# Patient Record
Sex: Female | Born: 1990 | Race: White | Hispanic: No | Marital: Single | Smoking: Current every day smoker
Health system: Southern US, Community
[De-identification: ages and names within clinical notes are randomized; demographics above are authoritative.]

## PROBLEM LIST (undated history)

## (undated) ENCOUNTER — Emergency Department (HOSPITAL_COMMUNITY): Payer: No Typology Code available for payment source

## (undated) ENCOUNTER — Inpatient Hospital Stay (HOSPITAL_COMMUNITY): Payer: Self-pay

## (undated) DIAGNOSIS — Z87442 Personal history of urinary calculi: Secondary | ICD-10-CM

## (undated) DIAGNOSIS — Z22322 Carrier or suspected carrier of Methicillin resistant Staphylococcus aureus: Secondary | ICD-10-CM

## (undated) DIAGNOSIS — T8859XA Other complications of anesthesia, initial encounter: Secondary | ICD-10-CM

## (undated) DIAGNOSIS — F32A Depression, unspecified: Secondary | ICD-10-CM

## (undated) DIAGNOSIS — N200 Calculus of kidney: Secondary | ICD-10-CM

## (undated) DIAGNOSIS — F419 Anxiety disorder, unspecified: Secondary | ICD-10-CM

## (undated) DIAGNOSIS — F1110 Opioid abuse, uncomplicated: Secondary | ICD-10-CM

## (undated) DIAGNOSIS — I10 Essential (primary) hypertension: Secondary | ICD-10-CM

## (undated) DIAGNOSIS — N83209 Unspecified ovarian cyst, unspecified side: Secondary | ICD-10-CM

## (undated) DIAGNOSIS — N390 Urinary tract infection, site not specified: Secondary | ICD-10-CM

## (undated) DIAGNOSIS — O039 Complete or unspecified spontaneous abortion without complication: Secondary | ICD-10-CM

## (undated) DIAGNOSIS — K759 Inflammatory liver disease, unspecified: Secondary | ICD-10-CM

## (undated) DIAGNOSIS — I38 Endocarditis, valve unspecified: Secondary | ICD-10-CM

## (undated) HISTORY — DX: Complete or unspecified spontaneous abortion without complication: O03.9

## (undated) HISTORY — PX: CARDIAC CATHETERIZATION: SHX172

## (undated) HISTORY — PX: FINGER SURGERY: SHX640

## (undated) HISTORY — PX: OTHER SURGICAL HISTORY: SHX169

---
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
## 2010-04-05 ENCOUNTER — Emergency Department: Payer: Self-pay | Admitting: Unknown Physician Specialty

## 2010-06-09 IMAGING — CR DG FEMUR 2V*R*
1 series · 5 of 5 positions shown · non-contrast
Comparison: none

REASON FOR EXAM: right leg pain
COMMENTS:

PROCEDURE:     DXR - DXR FEMUR RIGHT  - [DATE] [DATE]
RESULT:     No fracture, dislocation or other acute bony abnormality is
identified.

[Series 1: t femur proximal ap right · 0.14mm/px · 5 of 5 slices shown]
[im 1/5]
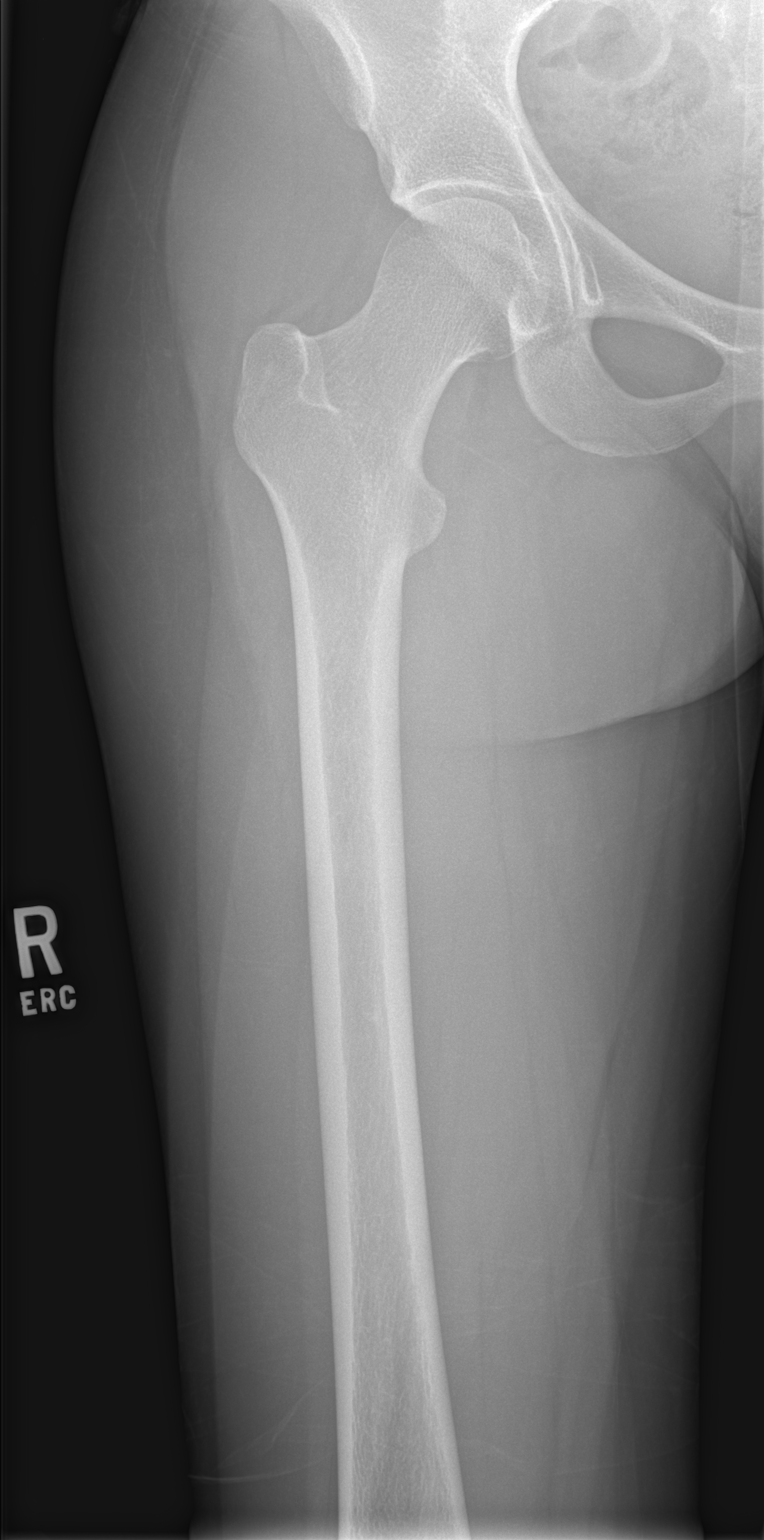
[im 2/5]
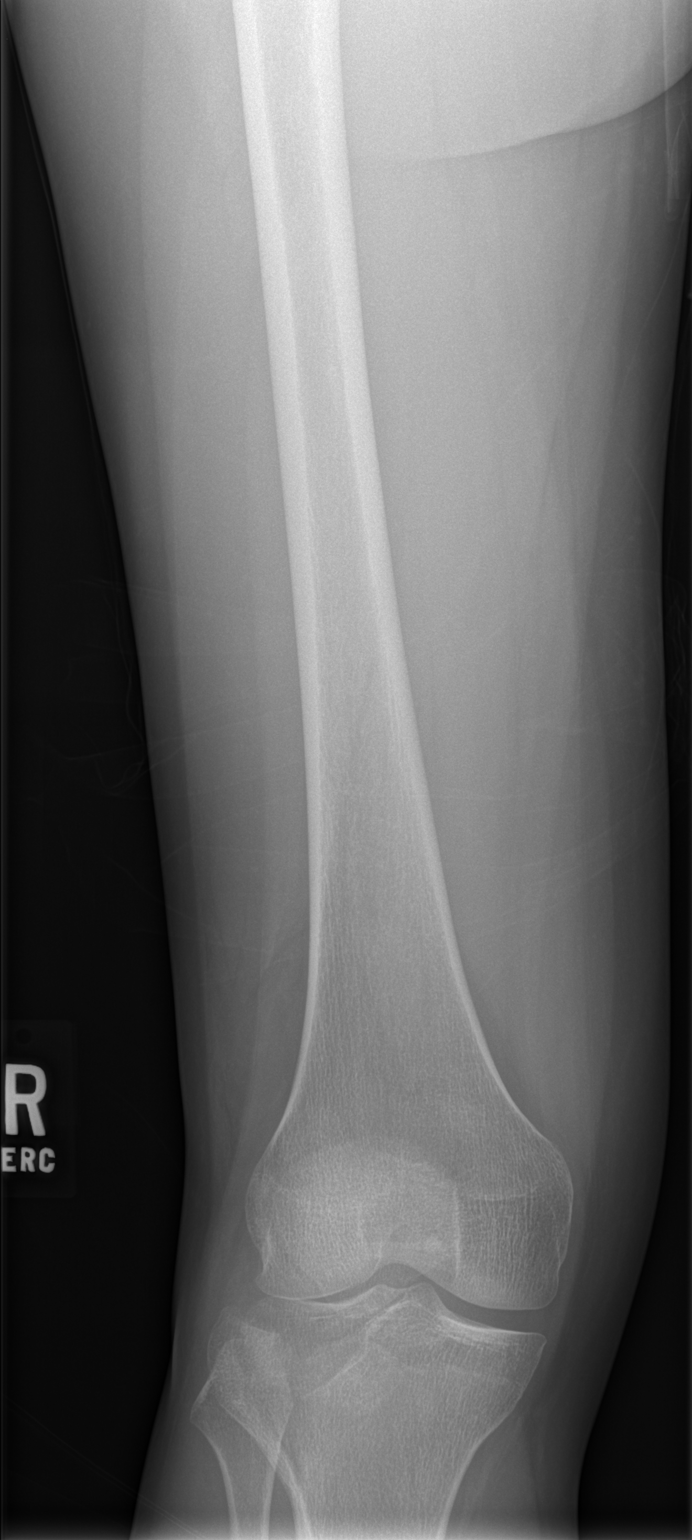
[im 3/5]
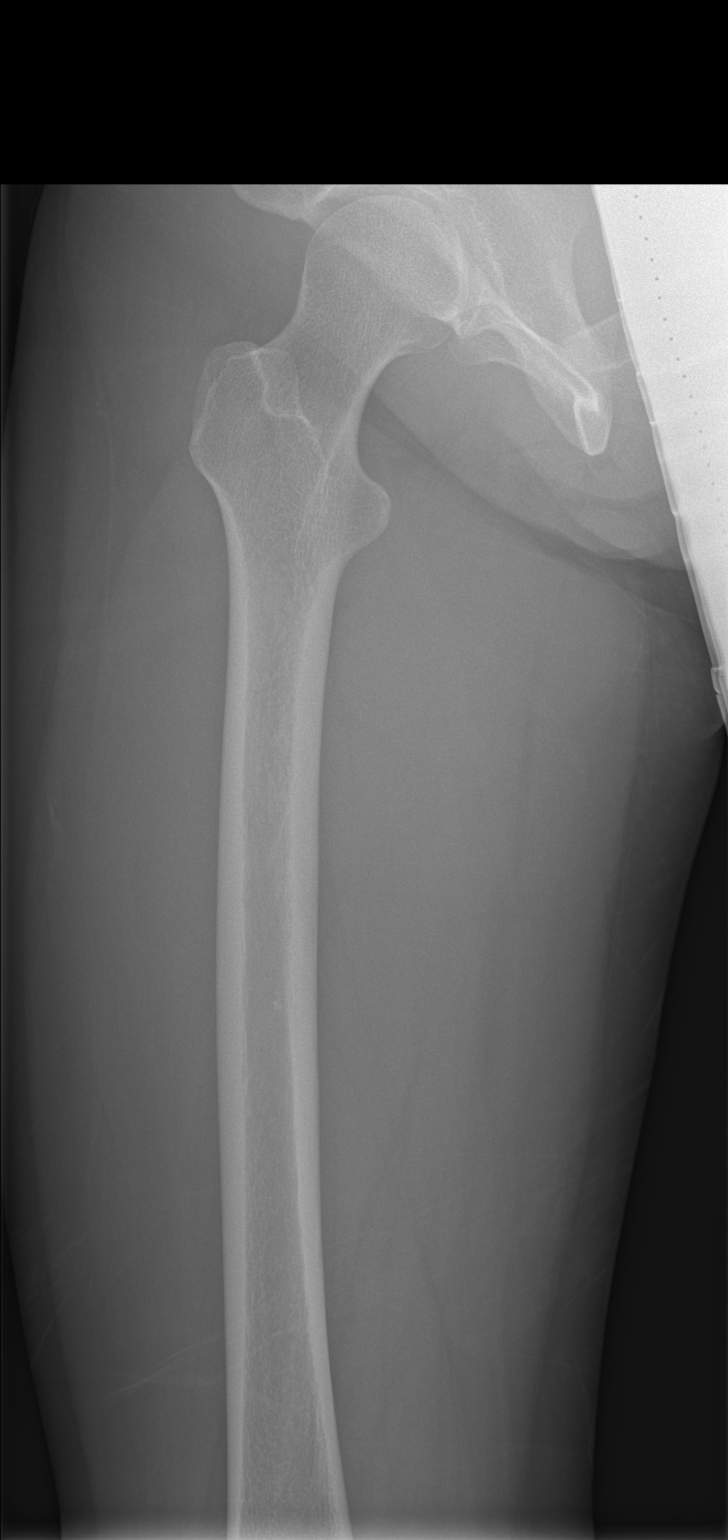
[im 4/5]
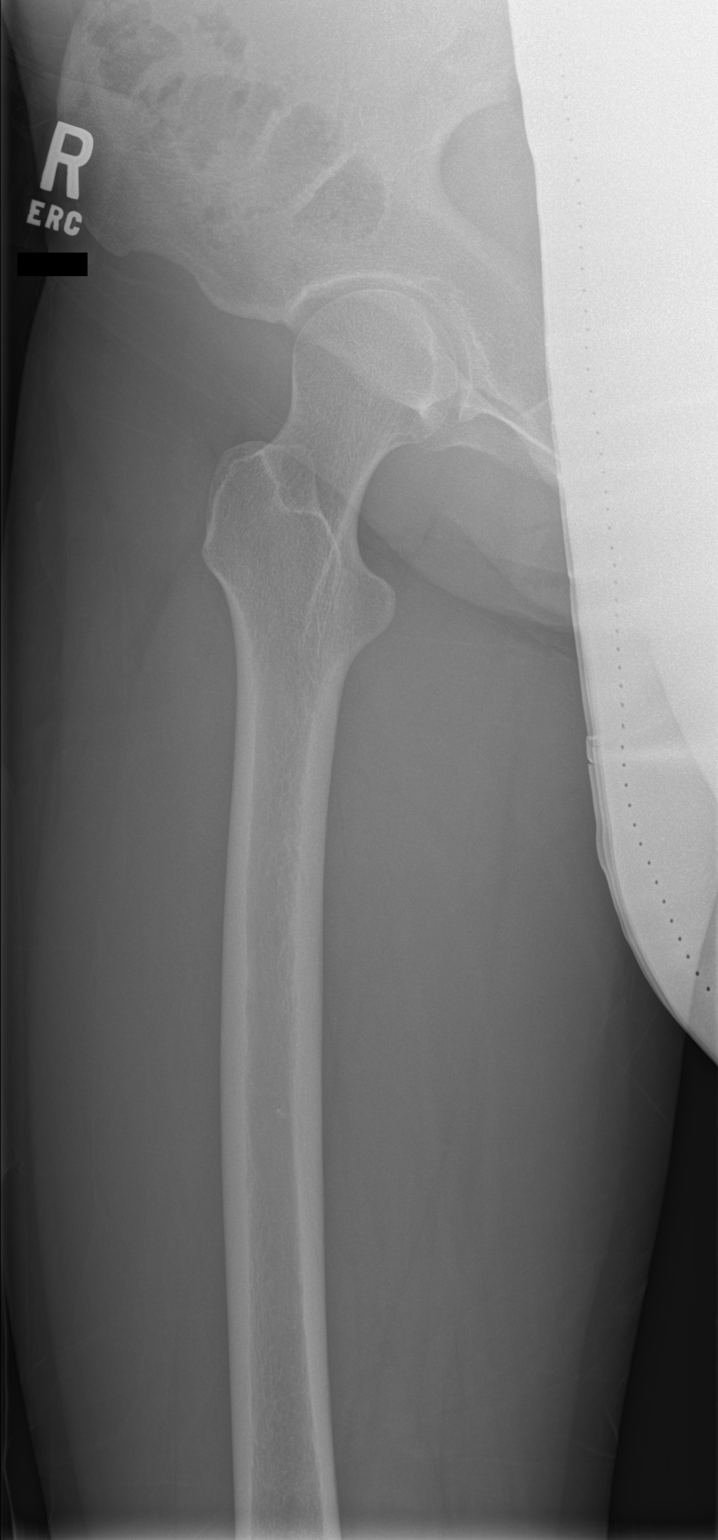
[im 5/5]
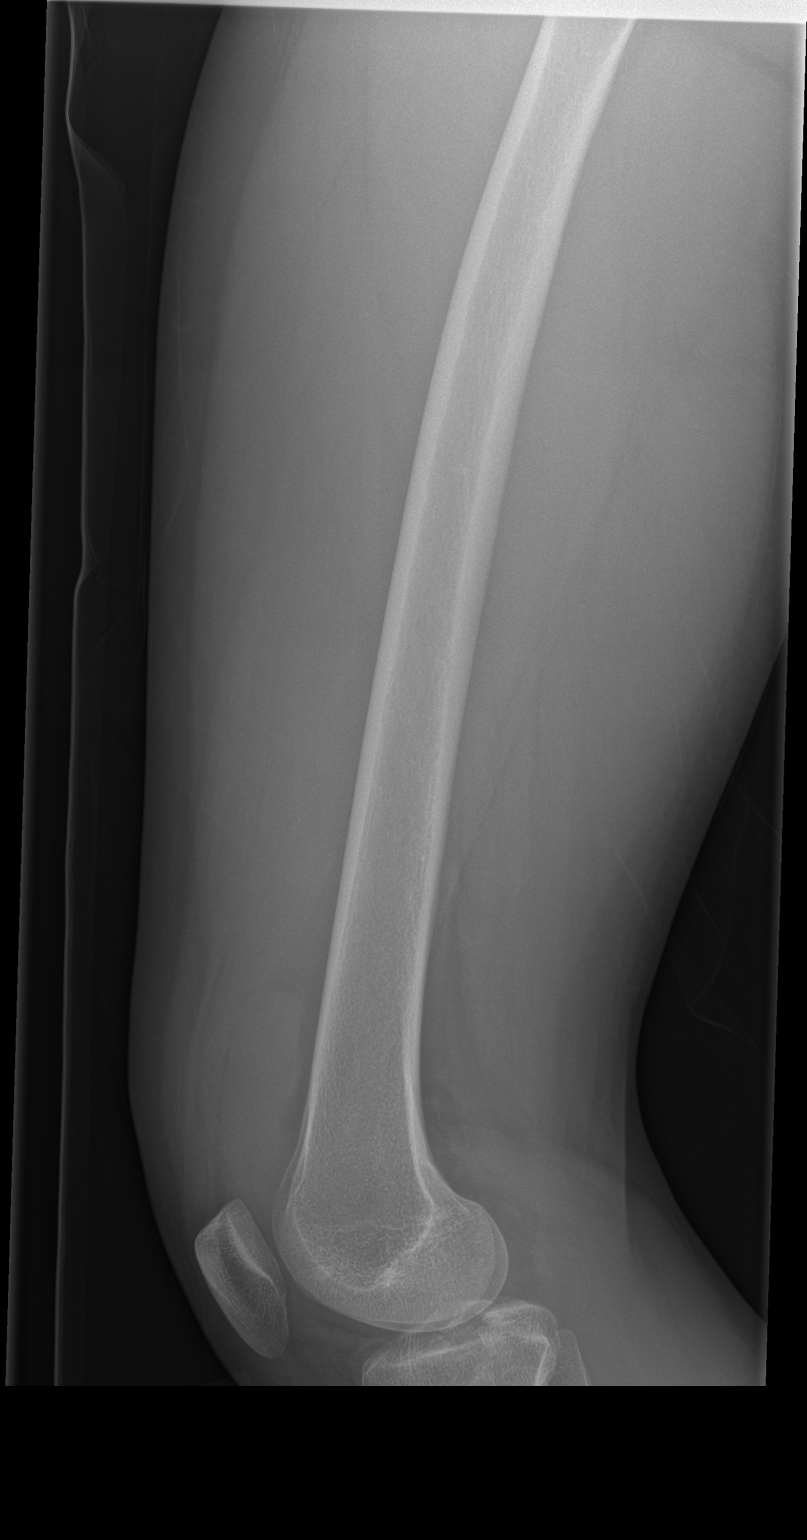

[5 of 5 positions shown; findings below may reference images not displayed]

IMPRESSION: No significant osseous abnormalities are noted.

## 2010-09-05 ENCOUNTER — Emergency Department: Payer: Self-pay | Admitting: Emergency Medicine

## 2010-09-05 IMAGING — CT CT STONE STUDY
1 of 2 series · 15 of 32 positions shown, 19 images · non-contrast
Comparison: none

REASON FOR EXAM: pelvic pain with hematuria and elevate WBC
COMMENTS:   LMP: One week ago

[Series 2: stone · axial · 0.55mm/px · z∈[-500,-156]mm · 15 of 130 slices shown, 19 images]
[im 10/130  soft-tissue]
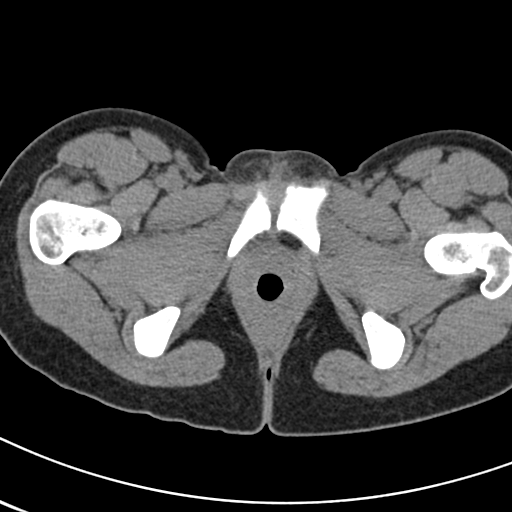
[im 10/130  bone]
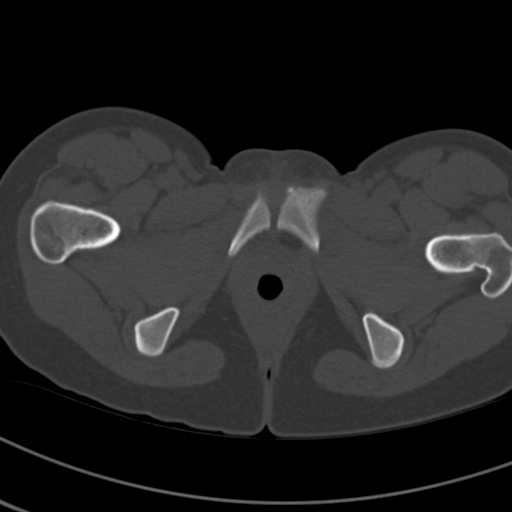
[im 20/130  soft-tissue]
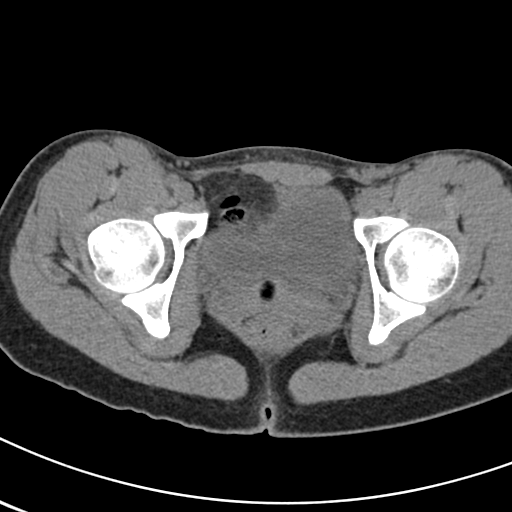
[im 29/130  soft-tissue]
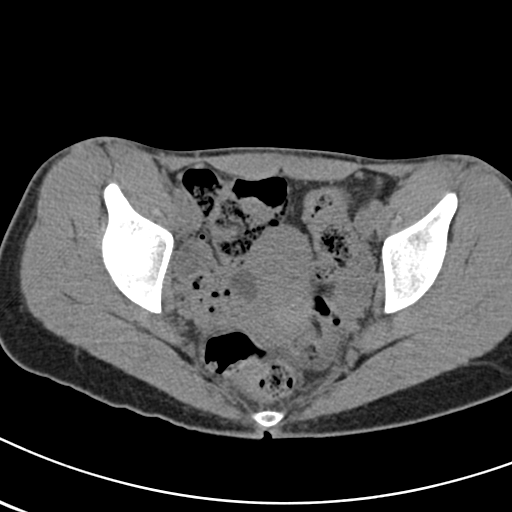
[im 39/130  soft-tissue]
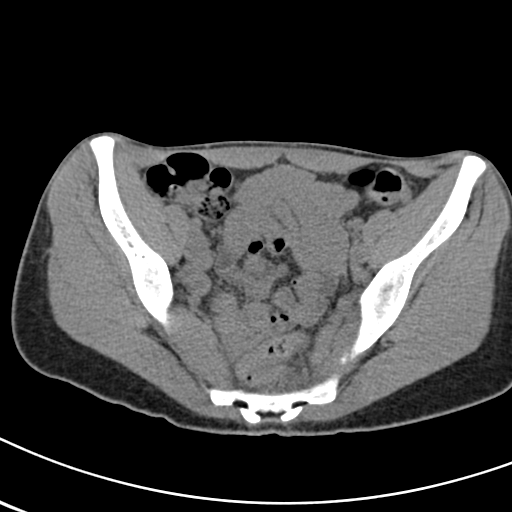
[im 48/130  soft-tissue]
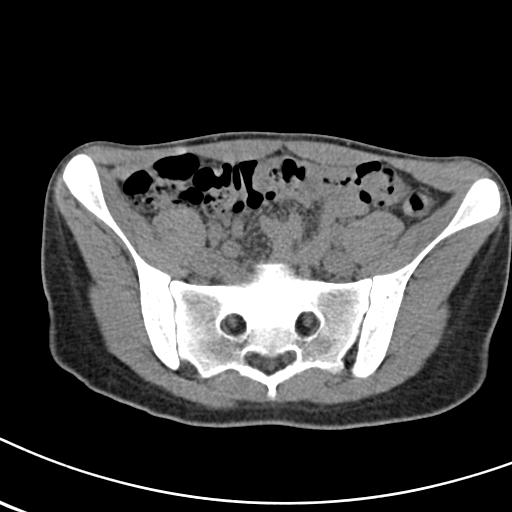
[im 58/130  soft-tissue]
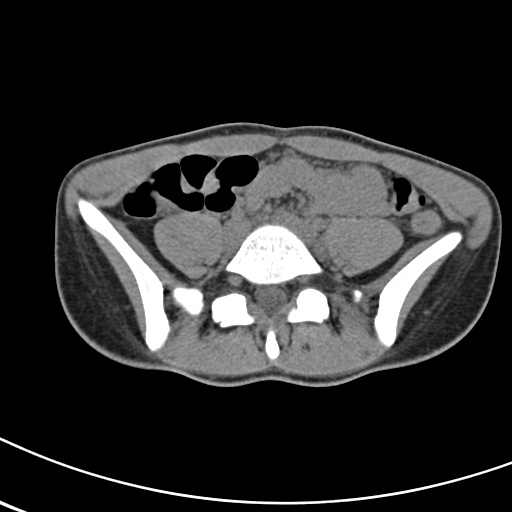
[im 67/130  soft-tissue]
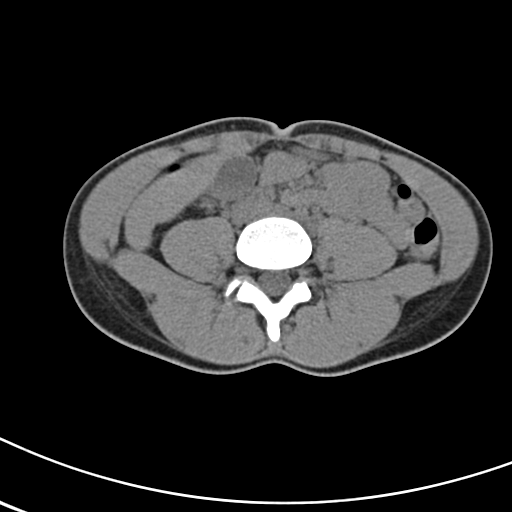
[im 77/130  soft-tissue]
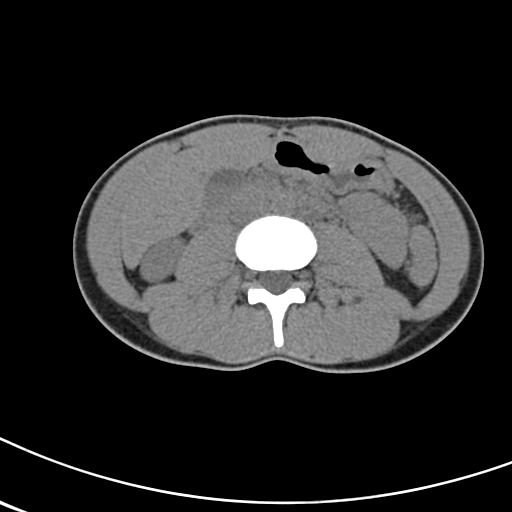
[im 87/130  soft-tissue]
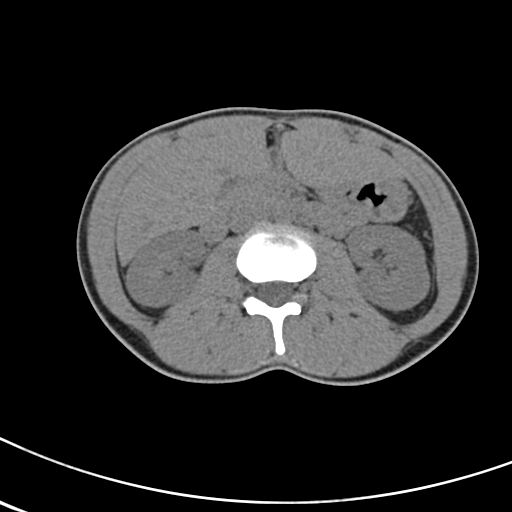
[im 87/130  bone]
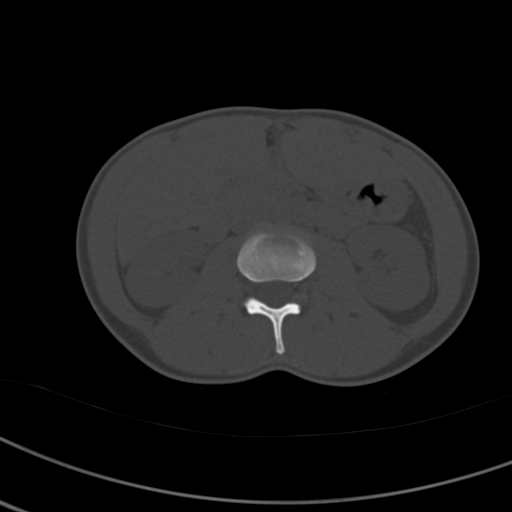
[im 96/130  soft-tissue]
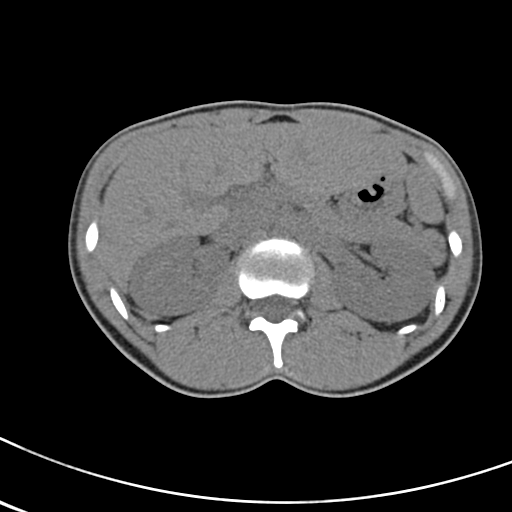
[im 106/130  soft-tissue]
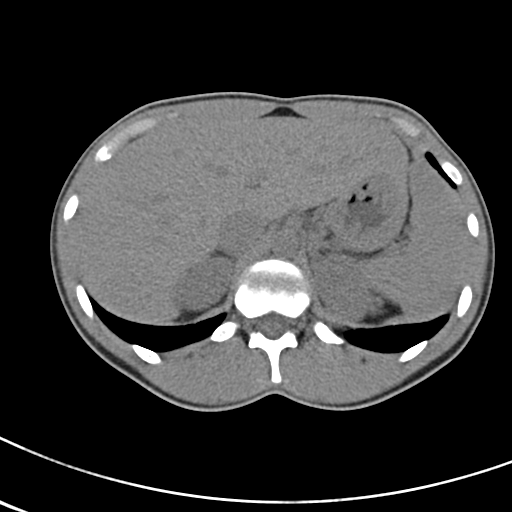
[im 110/130  lung]
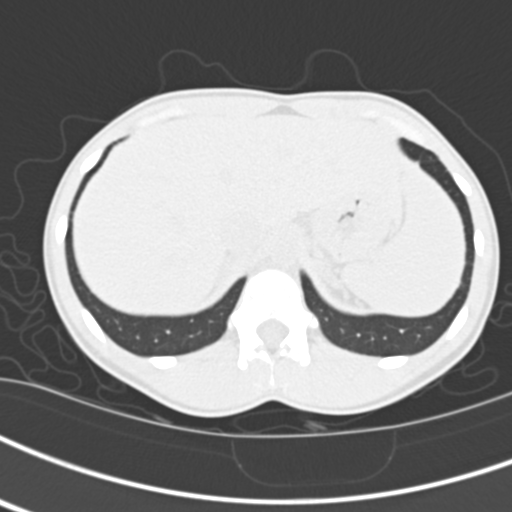
[im 115/130  soft-tissue]
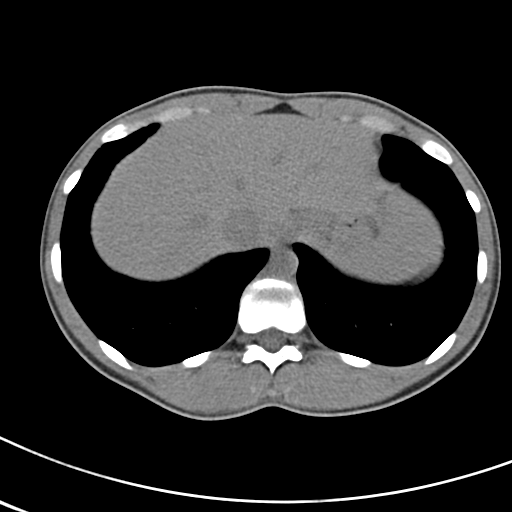
[im 115/130  lung]
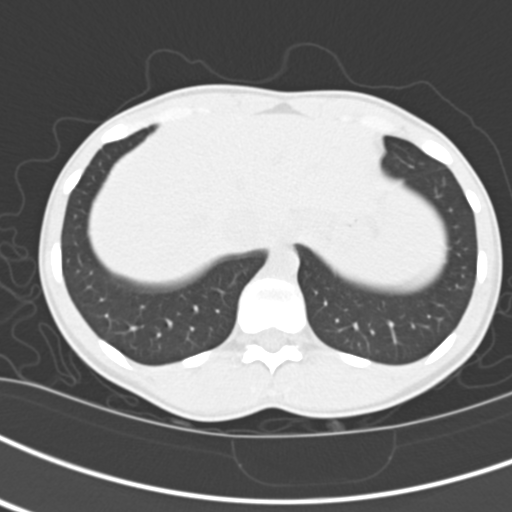
[im 120/130  lung]
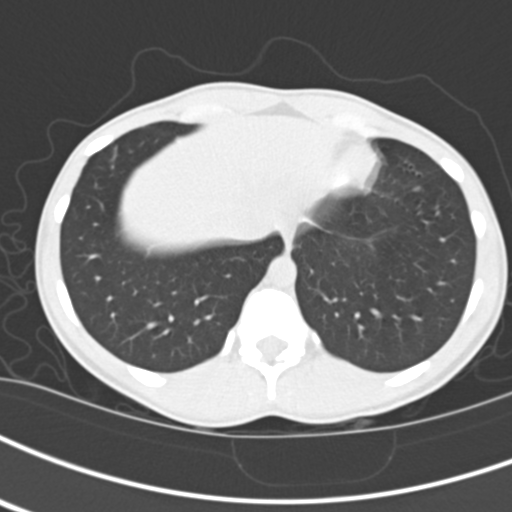
[im 125/130  soft-tissue]
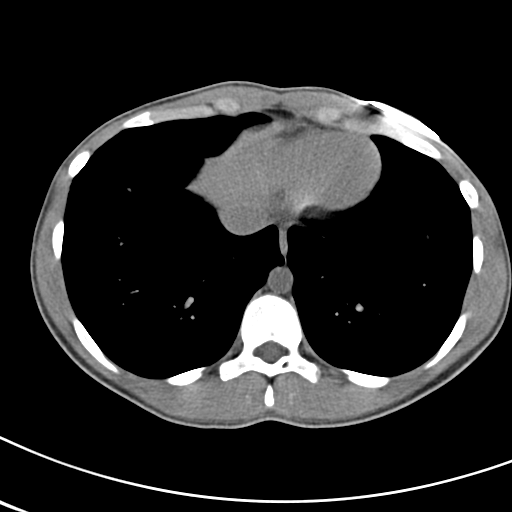
[im 125/130  lung]
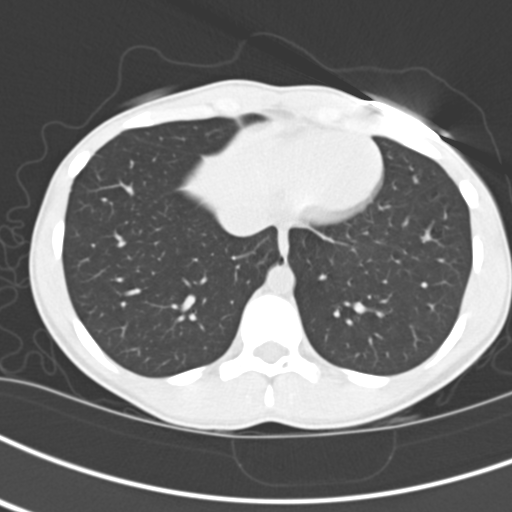

[15 of 32 positions shown; findings below may reference images not displayed]

PROCEDURE:     CT  - CT ABDOMEN /PELVIS WO (STONE)  - [DATE]  [DATE]

RESULT:     Emergent noncontrast CT of the abdomen and pelvis is compared to
the previous examination of [DATE] and to an exam dated [DATE].
Images are reconstructed in the axial plane at 3.0 mm slice thickness.

Images through the base of the lungs demonstrate normal aeration. There is
evidence of a right adnexal cyst measuring 1.6 cm diameter on image 103. The
ureters are poorly demonstrated given the paucity of retroperitoneal fat. No
definite nephrolithiasis or hydronephrosis is evident. The other abdominal
viscera appear grossly normal for a noncontrast exam area no cholelithiasis
is evident. There is no evidence of acute inflammatory process but again
there is limited visualization. No bladder calculi are evident. The uterus
is unremarkable. The appendix is not identified with certainty. Portions of
the appendix appear to be adjacent to the right pelvic sidewall between
images 95 and 106 and appear to be unremarkable.
IMPRESSION: 1. Limited exam because of the paucity of mesenteric and retroperitoneal
fat. No definite urinary obstruction or bowel obstruction. No definite acute
inflammation. Probable right ovarian cyst present. No significant free fluid
or abnormal fluid collection evident. A vaginal tampon is noted incidentally.

## 2010-12-26 ENCOUNTER — Emergency Department (HOSPITAL_COMMUNITY)
Admission: EM | Admit: 2010-12-26 | Discharge: 2010-12-26 | Disposition: A | Discharge status: Home / Self Care | Payer: Self-pay | Attending: Emergency Medicine | Admitting: Emergency Medicine

## 2010-12-26 DIAGNOSIS — R300 Dysuria: Secondary | ICD-10-CM | POA: Insufficient documentation

## 2010-12-26 DIAGNOSIS — N898 Other specified noninflammatory disorders of vagina: Secondary | ICD-10-CM | POA: Insufficient documentation

## 2010-12-26 DIAGNOSIS — R109 Unspecified abdominal pain: Secondary | ICD-10-CM | POA: Insufficient documentation

## 2010-12-26 DIAGNOSIS — N390 Urinary tract infection, site not specified: Secondary | ICD-10-CM | POA: Insufficient documentation

## 2010-12-26 DIAGNOSIS — N949 Unspecified condition associated with female genital organs and menstrual cycle: Secondary | ICD-10-CM | POA: Insufficient documentation

## 2010-12-26 HISTORY — DX: Unspecified ovarian cyst, unspecified side: N83.209

## 2010-12-26 HISTORY — DX: Carrier or suspected carrier of methicillin resistant Staphylococcus aureus: Z22.322

## 2010-12-26 HISTORY — DX: Urinary tract infection, site not specified: N39.0

## 2010-12-26 HISTORY — DX: Calculus of kidney: N20.0

## 2010-12-26 LAB — URINALYSIS, ROUTINE W REFLEX MICROSCOPIC
Glucose, UA: NEGATIVE mg/dL
Nitrite: NEGATIVE
Specific Gravity, Urine: 1.016 (ref 1.005–1.030)
pH: 7 (ref 5.0–8.0)

## 2010-12-26 LAB — URINE MICROSCOPIC-ADD ON

## 2010-12-26 MED ORDER — PHENAZOPYRIDINE HCL 200 MG PO TABS: 200.0000 mg | ORAL_TABLET | Freq: Three times a day (TID) | ORAL | Status: AC

## 2010-12-26 MED ORDER — SULFAMETHOXAZOLE-TRIMETHOPRIM 800-160 MG PO TABS: 1.0000 | ORAL_TABLET | Freq: Two times a day (BID) | ORAL | Status: AC

## 2010-12-26 NOTE — ED Notes (Signed)
Symptoms began around 0300 am , having vaginal pressure , she is on her period but she is also having discharge. She is having dsyruia and hematuria, hx of kidney stones
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
## 2011-06-25 ENCOUNTER — Emergency Department: Payer: Self-pay | Admitting: Unknown Physician Specialty

## 2011-06-25 LAB — COMPREHENSIVE METABOLIC PANEL
Calcium, Total: 9 mg/dL (ref 8.5–10.1)
Chloride: 106 mmol/L (ref 98–107)
Co2: 28 mmol/L (ref 21–32)
Creatinine: 0.73 mg/dL (ref 0.60–1.30)
EGFR (African American): >60
EGFR (Non-African Amer.): >60
Potassium: 4 mmol/L (ref 3.5–5.1)
SGOT(AST): 24 U/L (ref 15–37)
Total Protein: 7.5 g/dL (ref 6.4–8.2)

## 2011-06-25 LAB — CBC
HGB: 13.2 g/dL (ref 12.0–16.0)
MCV: 86 fL (ref 80–100)

## 2011-06-25 LAB — DRUG SCREEN, URINE
Barbiturates, Ur Screen: NEGATIVE (ref ?–200)
MDMA (Ecstasy)Ur Screen: NEGATIVE (ref ?–500)
Methadone, Ur Screen: NEGATIVE (ref ?–300)
Phencyclidine (PCP) Ur S: NEGATIVE (ref ?–25)
Tricyclic, Ur Screen: NEGATIVE (ref ?–1000)

## 2011-06-25 LAB — URINALYSIS, COMPLETE
Ph: 6 (ref 4.5–8.0)
Protein: NEGATIVE
Specific Gravity: 1.011 (ref 1.003–1.030)
WBC UR: 14 /HPF (ref 0–5)

## 2011-06-29 ENCOUNTER — Emergency Department: Payer: Self-pay | Admitting: *Deleted

## 2011-06-29 LAB — ETHANOL
Ethanol %: <0.003 % (ref 0.000–0.080)
Ethanol: <3 mg/dL
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
## 2011-07-02 ENCOUNTER — Ambulatory Visit: Payer: Self-pay | Admitting: Orthopedic Surgery

## 2011-07-02 ENCOUNTER — Emergency Department: Payer: Self-pay | Admitting: Emergency Medicine
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
## 2012-10-14 ENCOUNTER — Emergency Department (HOSPITAL_COMMUNITY): Payer: Self-pay

## 2012-10-14 ENCOUNTER — Encounter (HOSPITAL_COMMUNITY): Payer: Self-pay | Admitting: Radiology

## 2012-10-14 ENCOUNTER — Emergency Department (HOSPITAL_COMMUNITY)
Admission: EM | Admit: 2012-10-14 | Discharge: 2012-10-15 | Disposition: A | Discharge status: Home / Self Care | Payer: Self-pay | Attending: Emergency Medicine | Admitting: Emergency Medicine

## 2012-10-14 DIAGNOSIS — Z87442 Personal history of urinary calculi: Secondary | ICD-10-CM | POA: Insufficient documentation

## 2012-10-14 DIAGNOSIS — Z8614 Personal history of Methicillin resistant Staphylococcus aureus infection: Secondary | ICD-10-CM | POA: Insufficient documentation

## 2012-10-14 DIAGNOSIS — Z87440 Personal history of urinary (tract) infections: Secondary | ICD-10-CM | POA: Insufficient documentation

## 2012-10-14 DIAGNOSIS — R109 Unspecified abdominal pain: Secondary | ICD-10-CM | POA: Insufficient documentation

## 2012-10-14 DIAGNOSIS — Z8742 Personal history of other diseases of the female genital tract: Secondary | ICD-10-CM | POA: Insufficient documentation

## 2012-10-14 DIAGNOSIS — F17200 Nicotine dependence, unspecified, uncomplicated: Secondary | ICD-10-CM | POA: Insufficient documentation

## 2012-10-14 LAB — COMPREHENSIVE METABOLIC PANEL
ALT: 9 U/L (ref 0–35)
AST: 13 U/L (ref 0–37)
Albumin: 3.4 g/dL — ABNORMAL LOW (ref 3.5–5.2)
Alkaline Phosphatase: 79 U/L (ref 39–117)
BUN: 9 mg/dL (ref 6–23)
CO2: 24 mEq/L (ref 19–32)
Calcium: 9.3 mg/dL (ref 8.4–10.5)
Chloride: 105 mEq/L (ref 96–112)
Creatinine, Ser: 0.69 mg/dL (ref 0.50–1.10)
GFR calc Af Amer: >90 mL/min (ref >90)
GFR calc non Af Amer: >90 mL/min (ref >90)
Glucose, Bld: 87 mg/dL (ref 70–99)
Potassium: 3.7 mEq/L (ref 3.5–5.1)
Sodium: 138 mEq/L (ref 135–145)
Total Bilirubin: 0.3 mg/dL (ref 0.3–1.2)
Total Protein: 7.1 g/dL (ref 6.0–8.3)

## 2012-10-14 LAB — CBC WITH DIFFERENTIAL/PLATELET
Basophils Absolute: 0 10*3/uL (ref 0.0–0.1)
Basophils Relative: 0 % (ref 0–1)
Eosinophils Absolute: 0.3 10*3/uL (ref 0.0–0.7)
Eosinophils Relative: 3 % (ref 0–5)
HCT: 37.6 % (ref 36.0–46.0)
Hemoglobin: 12.6 g/dL (ref 12.0–15.0)
Lymphocytes Relative: 18 % (ref 12–46)
Lymphs Abs: 1.8 10*3/uL (ref 0.7–4.0)
MCH: 28.5 pg (ref 26.0–34.0)
MCHC: 33.5 g/dL (ref 30.0–36.0)
MCV: 85.1 fL (ref 78.0–100.0)
Monocytes Absolute: 1 10*3/uL (ref 0.1–1.0)
Monocytes Relative: 10 % (ref 3–12)
Neutro Abs: 6.7 10*3/uL (ref 1.7–7.7)
Neutrophils Relative %: 69 % (ref 43–77)
Platelets: 319 10*3/uL (ref 150–400)
RBC: 4.42 MIL/uL (ref 3.87–5.11)
RDW: 14.1 % (ref 11.5–15.5)
WBC: 9.7 10*3/uL (ref 4.0–10.5)

## 2012-10-14 LAB — URINALYSIS, ROUTINE W REFLEX MICROSCOPIC
Bilirubin Urine: NEGATIVE
Glucose, UA: NEGATIVE mg/dL
Hgb urine dipstick: NEGATIVE
Ketones, ur: NEGATIVE mg/dL
Nitrite: NEGATIVE
Protein, ur: NEGATIVE mg/dL
Specific Gravity, Urine: 1.018 (ref 1.005–1.030)
Urobilinogen, UA: 0.2 mg/dL (ref 0.0–1.0)
pH: 6.5 (ref 5.0–8.0)

## 2012-10-14 LAB — WET PREP, GENITAL
Trich, Wet Prep: NONE SEEN
WBC, Wet Prep HPF POC: NONE SEEN
Yeast Wet Prep HPF POC: NONE SEEN

## 2012-10-14 LAB — URINE MICROSCOPIC-ADD ON

## 2012-10-14 IMAGING — CT CT ABD-PELV W/ CM
1 of 2 series · 16 of 32 positions shown, 20 images · IV contrast (OMNIPAQUE 300)
Comparison: Ultrasound [DATE]

CLINICAL DATA: Abdominal pain.

EXAM:
CT ABDOMEN AND PELVIS WITH CONTRAST
TECHNIQUE: Multidetector CT imaging of the abdomen and pelvis was performed
using the standard protocol following bolus administration of
intravenous contrast.
CONTRAST:  80mL OMNIPAQUE IOHEXOL 300 MG/ML  SOLN

[Series 2: abd/pel with · axial · 0.74mm/px · z∈[+1045,+1420]mm · 16 of 83 slices shown, 20 images]
[im 4/83  soft-tissue]
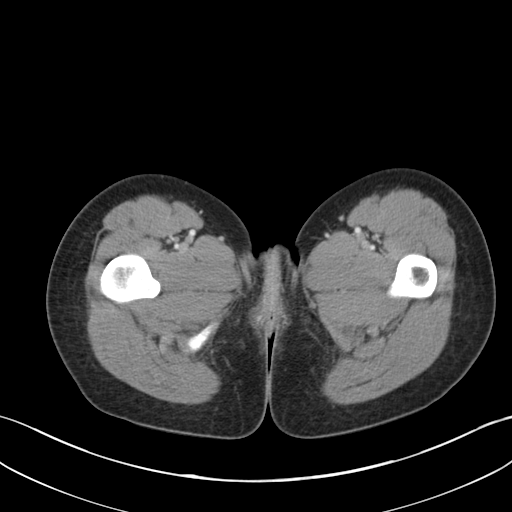
[im 4/83  bone]
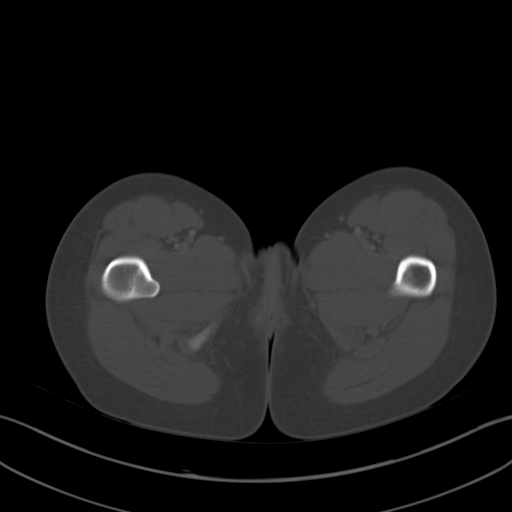
[im 11/83  soft-tissue]
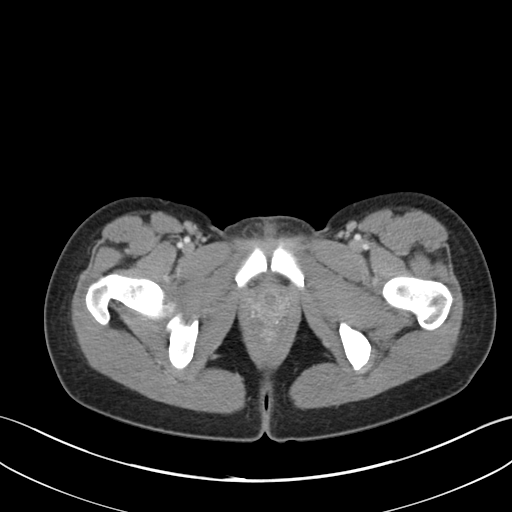
[im 18/83  soft-tissue]
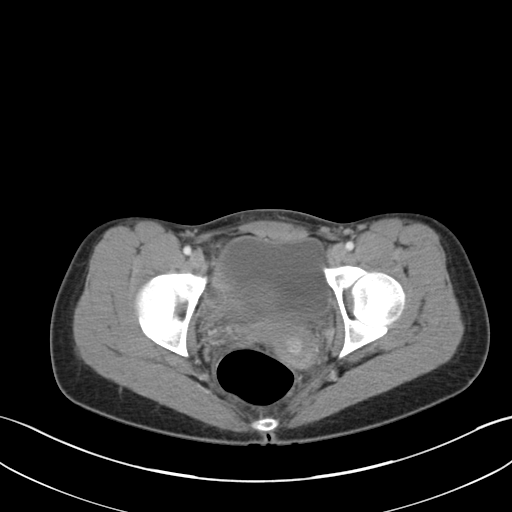
[im 21/83  soft-tissue]
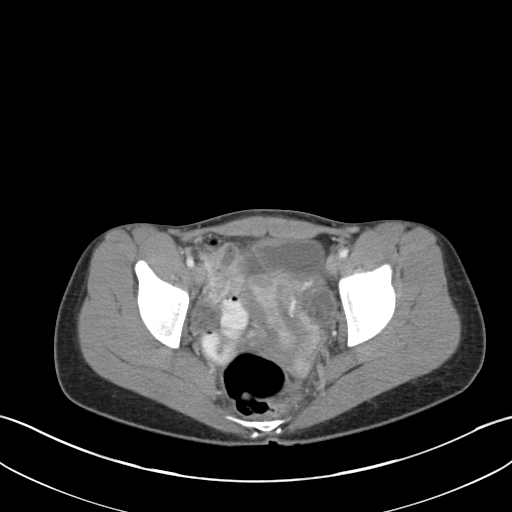
[im 28/83  soft-tissue]
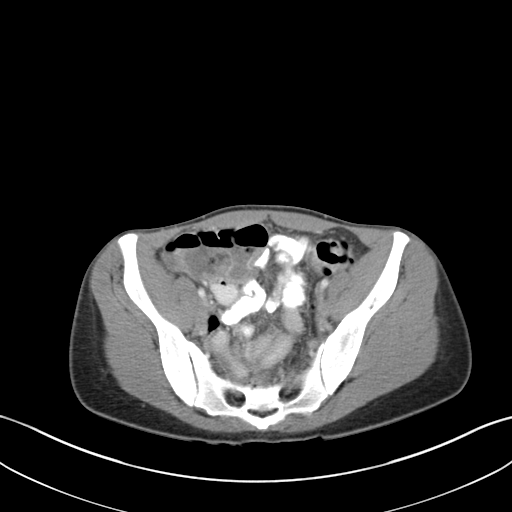
[im 35/83  soft-tissue]
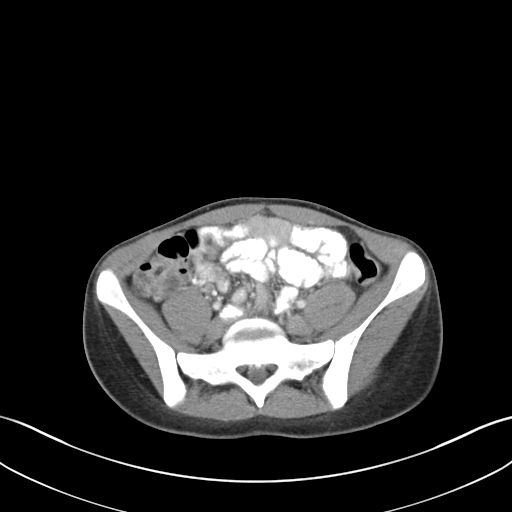
[im 38/83  soft-tissue]
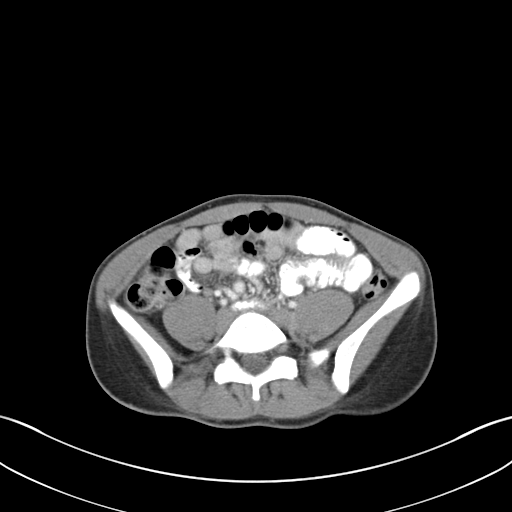
[im 45/83  soft-tissue]
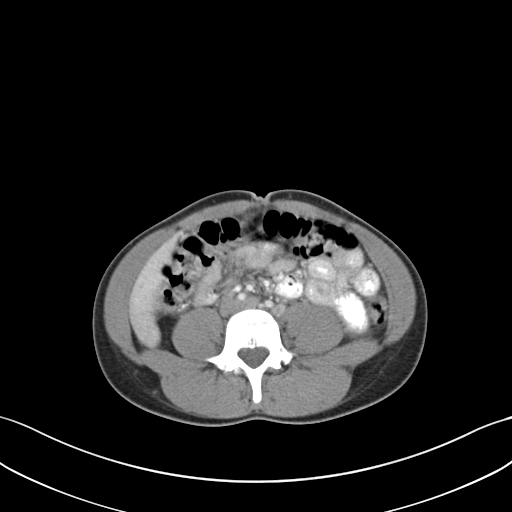
[im 48/83  soft-tissue]
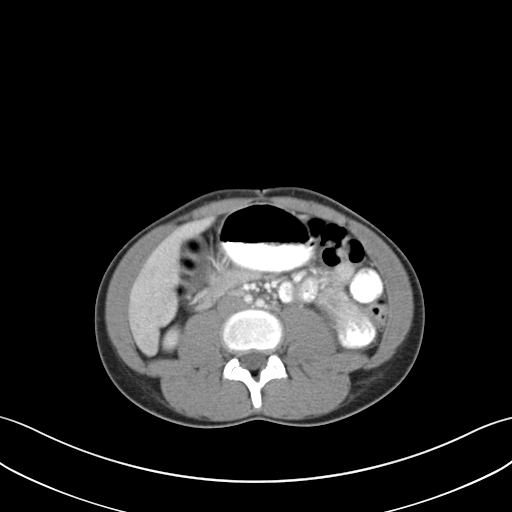
[im 48/83  bone]
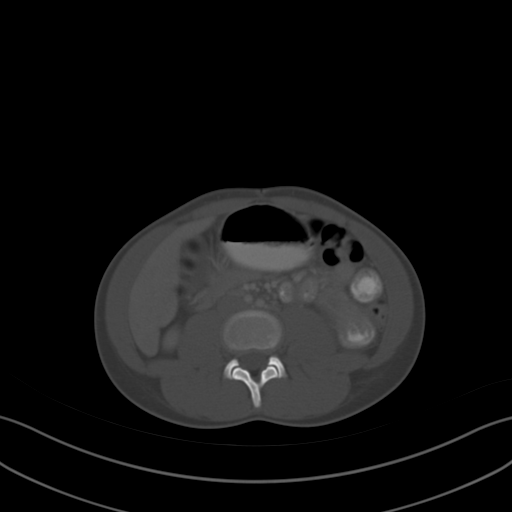
[im 55/83  soft-tissue]
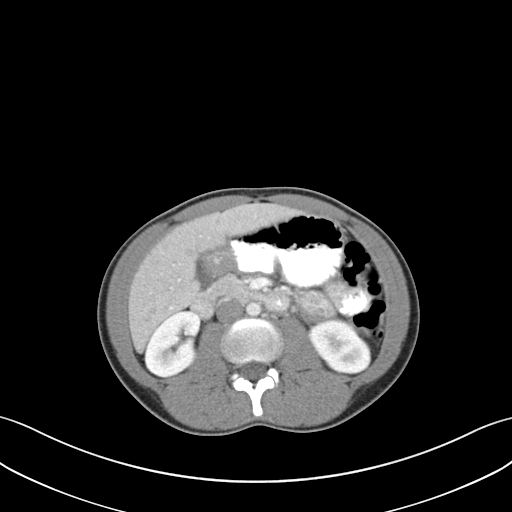
[im 62/83  soft-tissue]
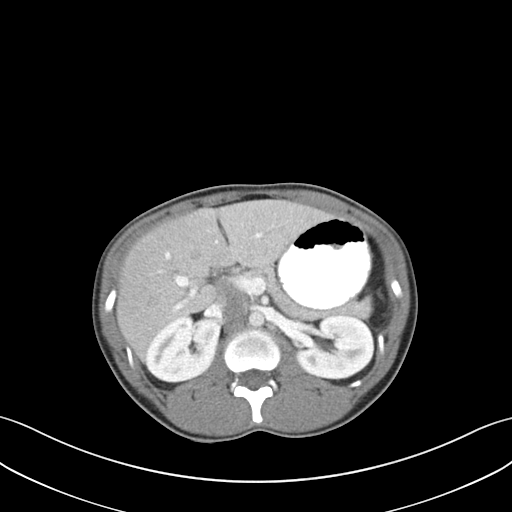
[im 65/83  soft-tissue]
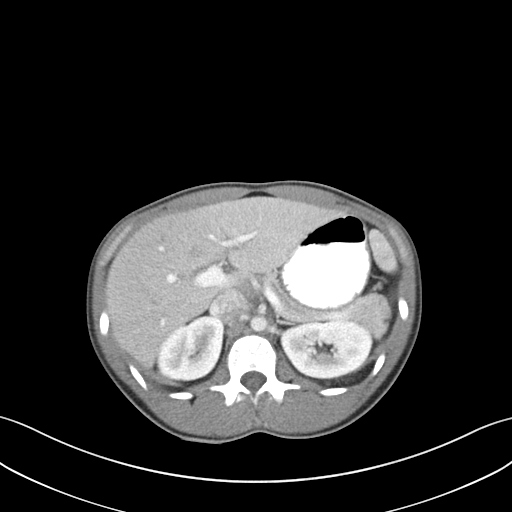
[im 69/83  lung]
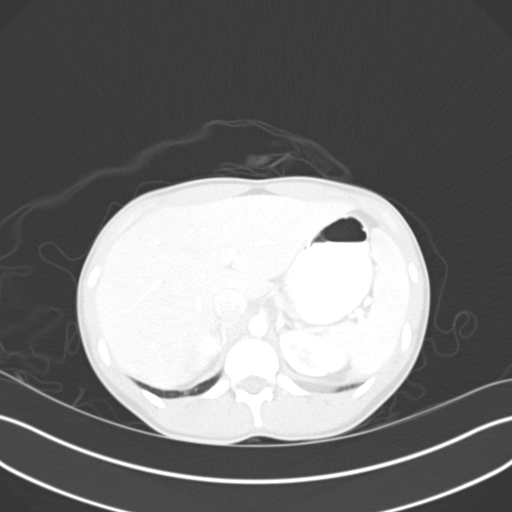
[im 72/83  soft-tissue]
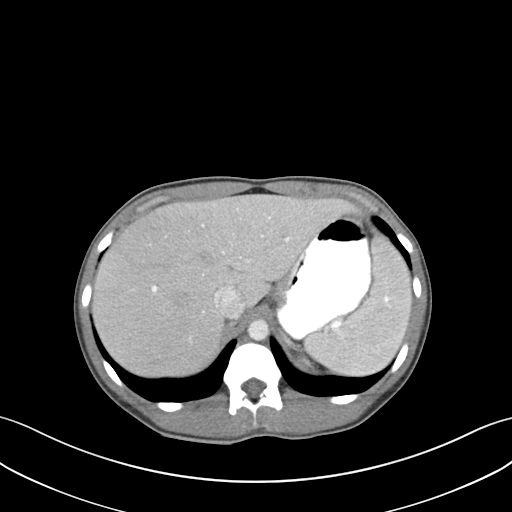
[im 72/83  lung]
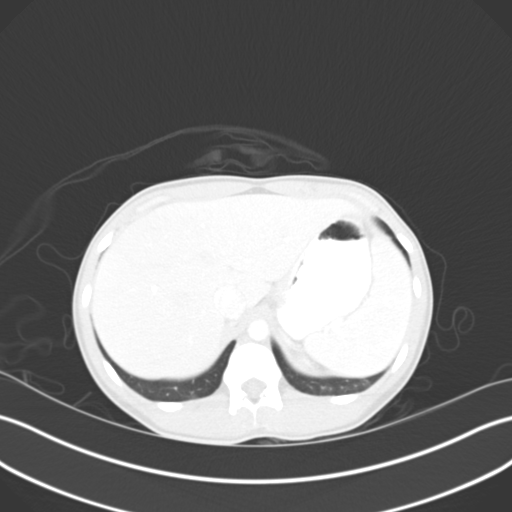
[im 76/83  lung]
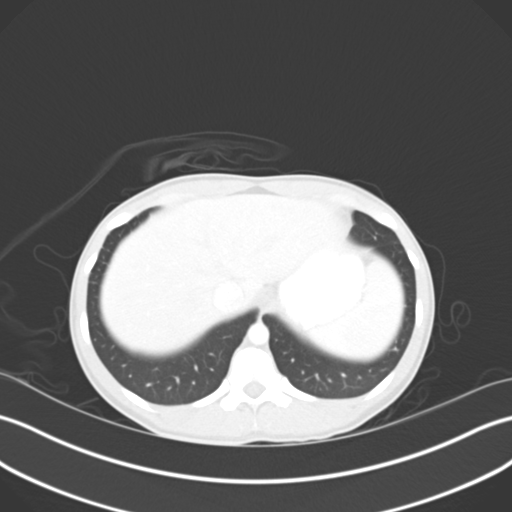
[im 79/83  soft-tissue]
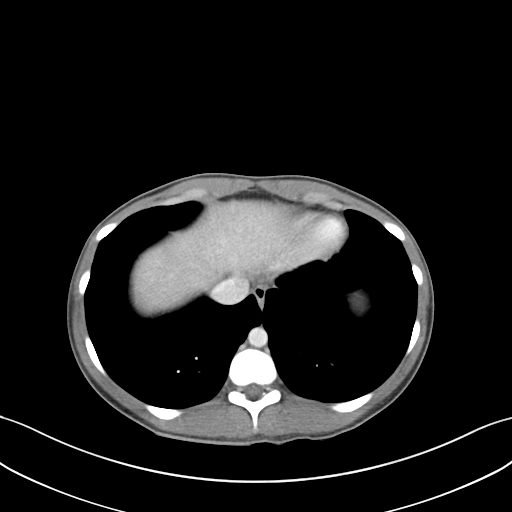
[im 79/83  lung]
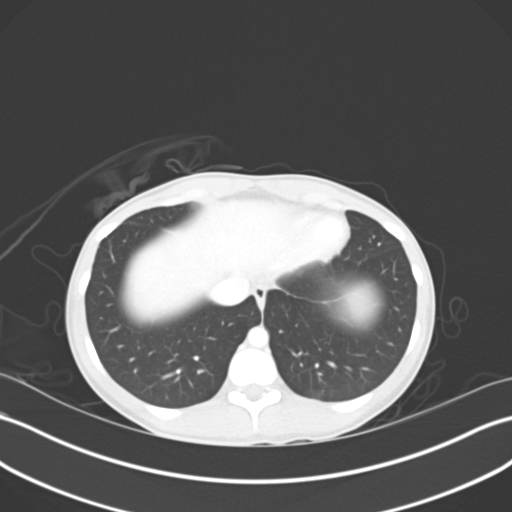

[16 of 32 positions shown; findings below may reference images not displayed]

FINDINGS: Liver, spleen, gallbladder, pancreas, adrenals and kidneys are
normal. Appendix is not definitively seen. No inflammatory process
in the right lower quadrant. Uterus and ovaries are unremarkable.
Multiple small follicles in the ovaries bilaterally. No free fluid,
free air or adenopathy. Large and small bowel are grossly
unremarkable. Aorta is normal caliber.

No acute bony abnormality.
IMPRESSION: No acute findings in the abdomen or pelvis.

## 2012-10-14 IMAGING — US US PELVIS COMPLETE
1 series · 13 of 25 positions shown · non-contrast
Comparison: None

CLINICAL DATA: Abdominal pain



[Series 1: us pelvis complete · 0.22mm/px · 13 of 40 slices shown]
[im 1/40]
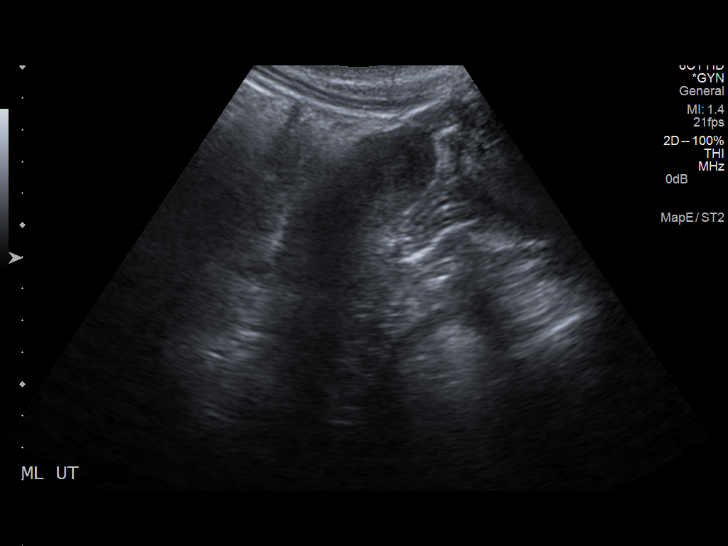
[im 4/40]
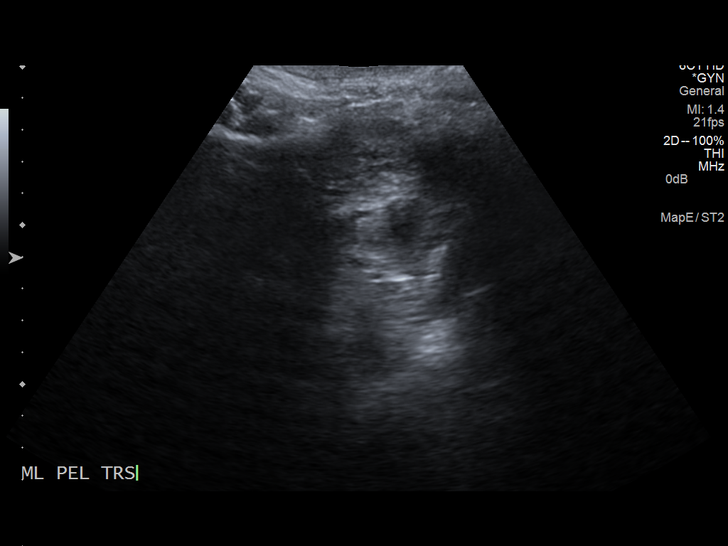
[im 7/40]
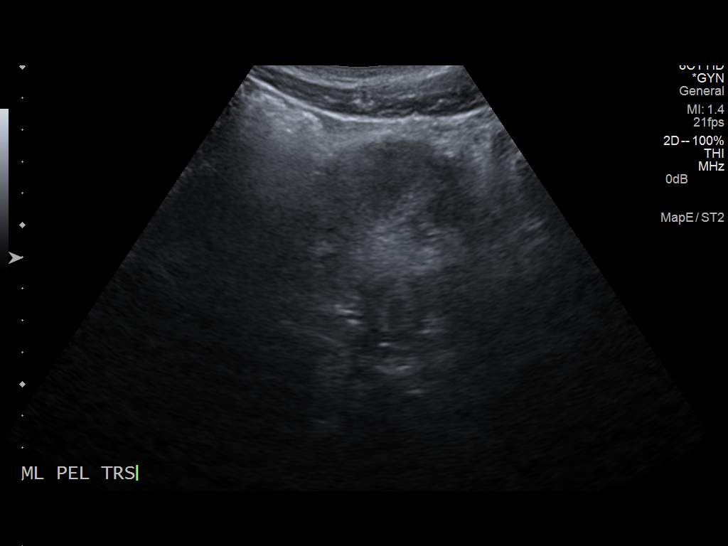
[im 10/40]
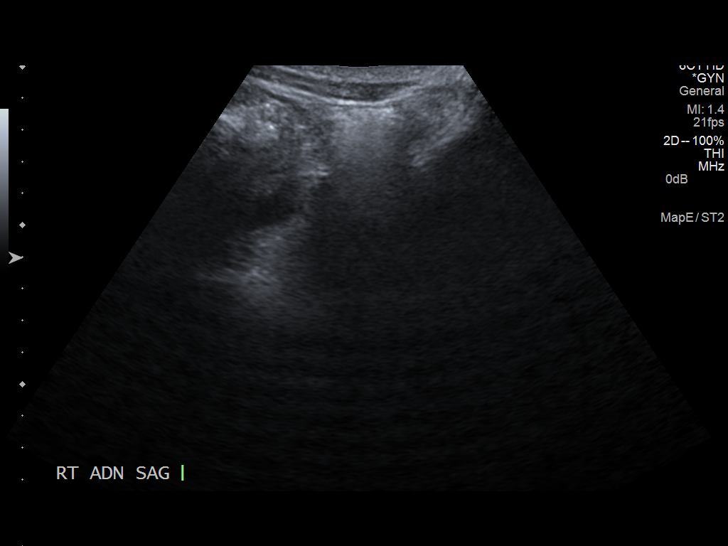
[im 14/40]
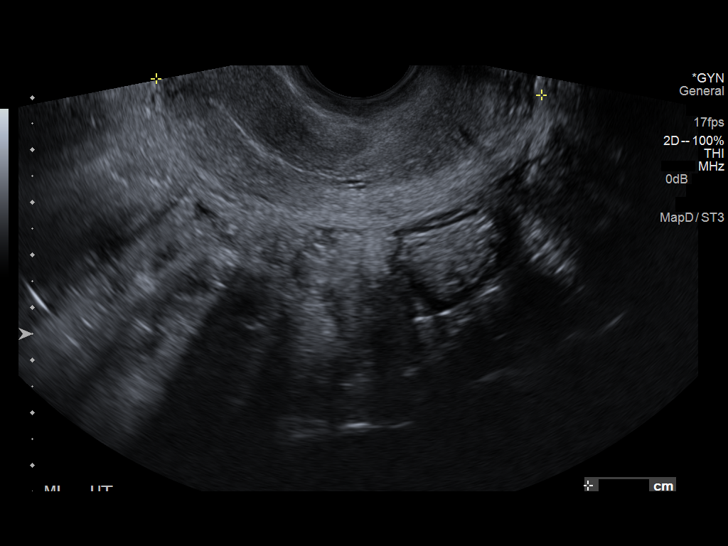
[im 17/40]
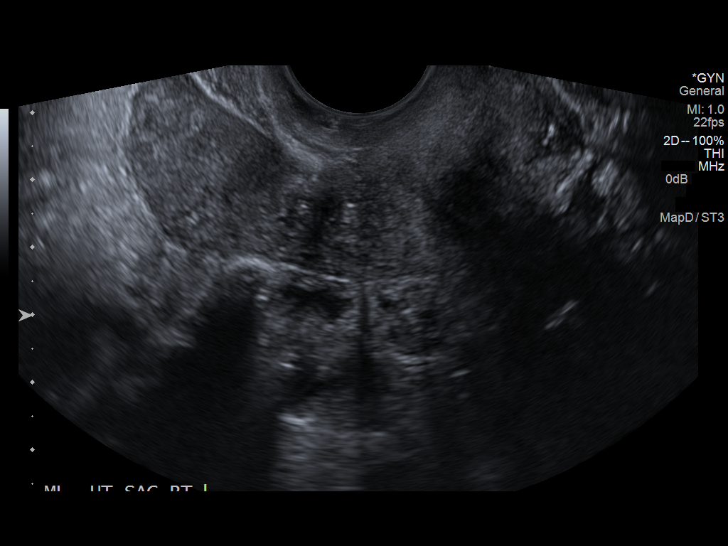
[im 20/40]
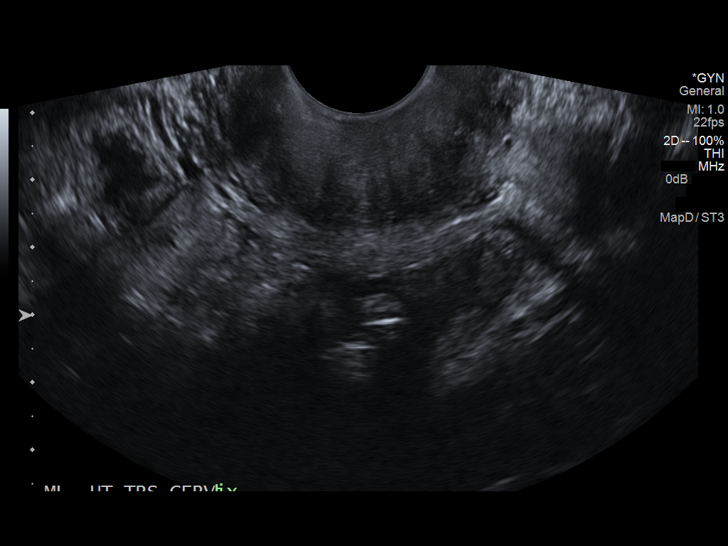
[im 23/40]
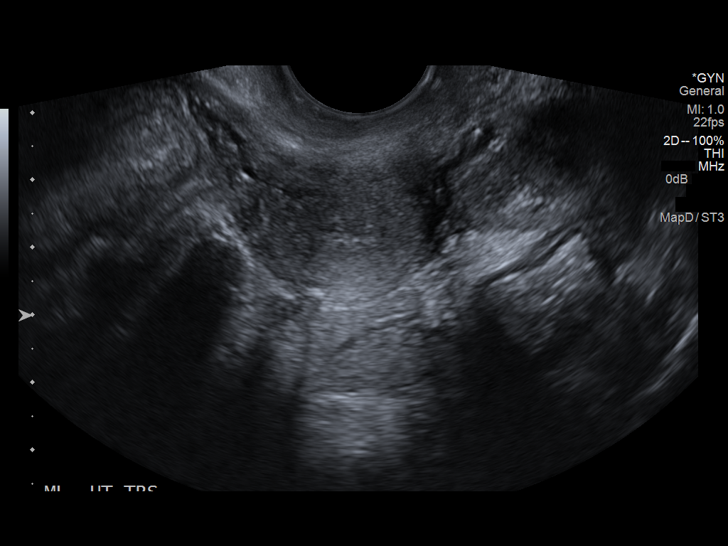
[im 27/40]
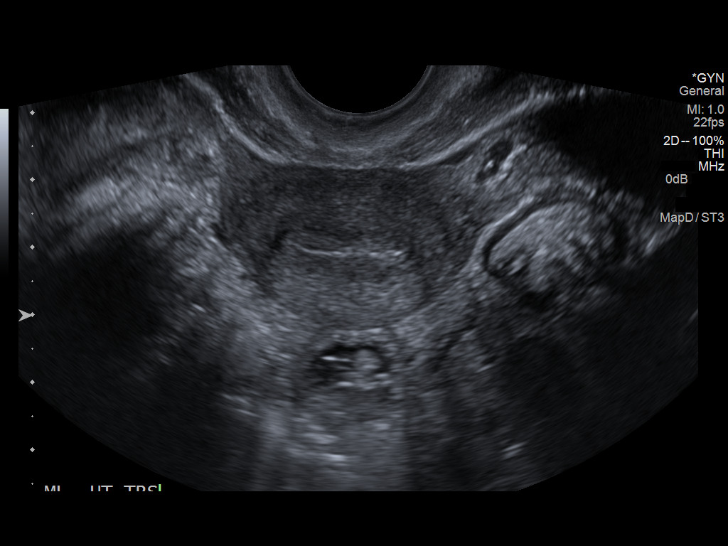
[im 30/40]
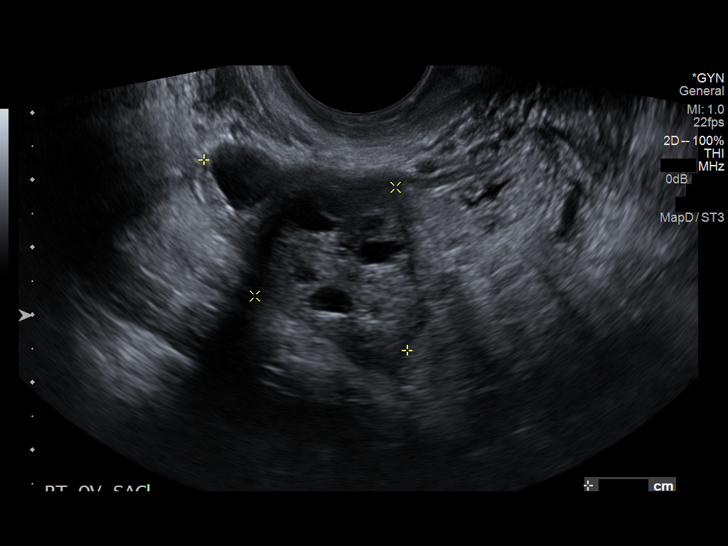
[im 33/40]
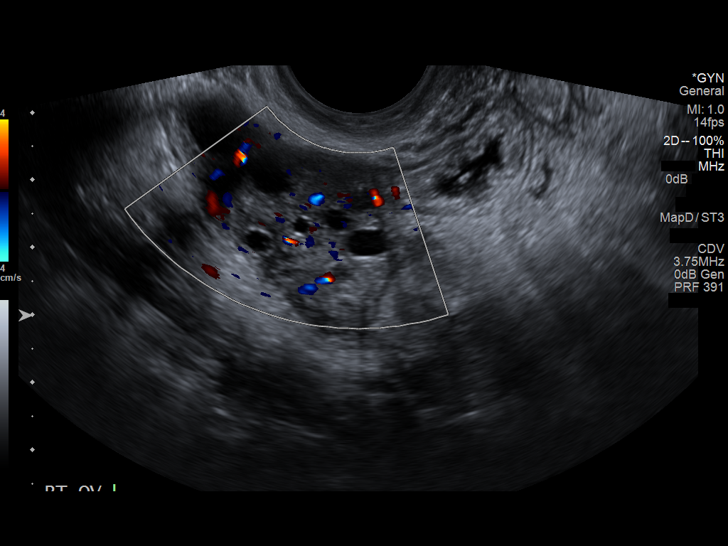
[im 36/40]
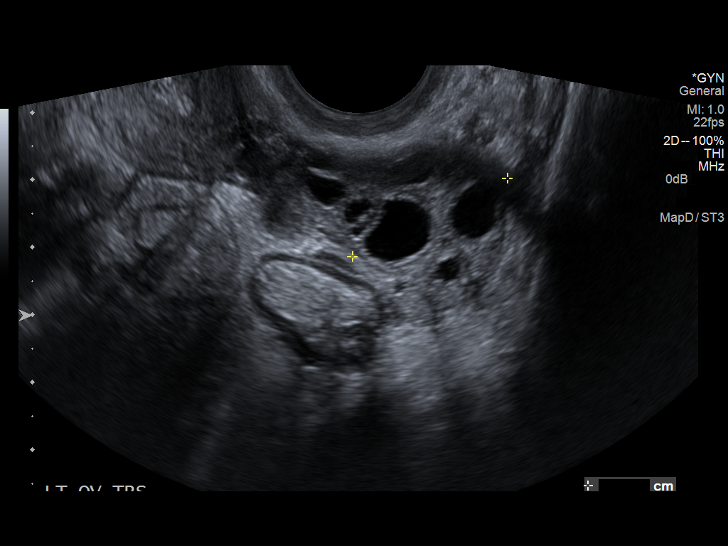
[im 40/40]
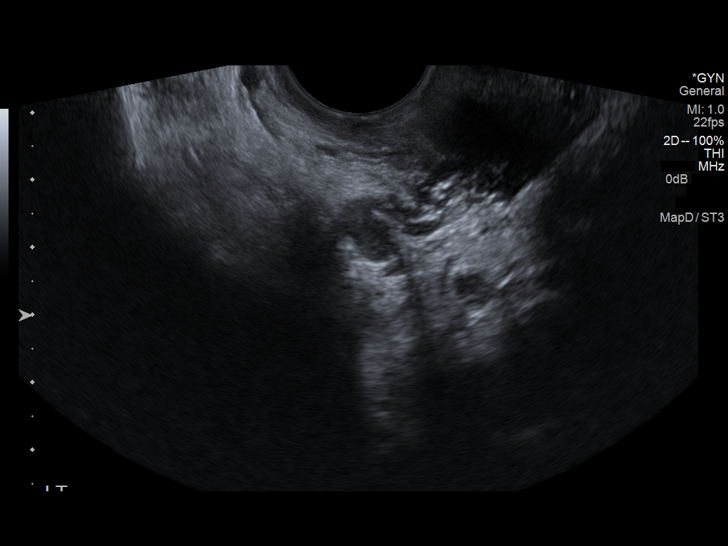

[13 of 25 positions shown; findings below may reference images not displayed]

FINDINGS: Uterus

Measurements: 7.3 x 2.6 x 3.9 cm. No fibroids or other mass
visualized. Cervix appears mildly prominent with tenderness during
the exam. Recommend clinical correlation with direct visualization
on pelvic exam.

Endometrium

Thickness: 3 mm.  No focal abnormality visualized.

Right ovary

Measurements: 4.1 x 2.6 x 2.5 cm. Multiple small follicles. Normal
appearance/no adnexal mass.

Left ovary

Measurements: 4.2 x 2.9 x 2.6 cm. Multiple small follicles. Normal
appearance/no adnexal mass.

Other findings

No free fluid.
IMPRESSION: Cervix appears mildly prominent with tenderness during the exam.
Recommend clinical correlation with physical exam.

Otherwise unremarkable.

## 2012-10-14 MED ORDER — ONDANSETRON HCL 4 MG/2ML IJ SOLN
4.0000 mg | Freq: Once | INTRAMUSCULAR | Status: AC
  Administered 2012-10-14: 4 mg via INTRAVENOUS
  Filled 2012-10-14: qty 2

## 2012-10-14 MED ORDER — IOHEXOL 300 MG/ML  SOLN
50.0000 mL | Freq: Once | INTRAMUSCULAR | Status: AC | PRN
  Administered 2012-10-14: 50 mL via ORAL

## 2012-10-14 MED ORDER — CEFIXIME 400 MG PO TABS
400.0000 mg | ORAL_TABLET | Freq: Once | ORAL | Status: AC
  Administered 2012-10-15: 400 mg via ORAL
  Filled 2012-10-14: qty 1

## 2012-10-14 MED ORDER — SODIUM CHLORIDE 0.9 % IV BOLUS (SEPSIS)
1000.0000 mL | Freq: Once | INTRAVENOUS | Status: AC
  Administered 2012-10-14: 1000 mL via INTRAVENOUS

## 2012-10-14 MED ORDER — MORPHINE SULFATE 4 MG/ML IJ SOLN
4.0000 mg | Freq: Once | INTRAMUSCULAR | Status: AC
  Administered 2012-10-14: 4 mg via INTRAVENOUS
  Filled 2012-10-14: qty 1

## 2012-10-14 MED ORDER — DOXYCYCLINE HYCLATE 100 MG PO CAPS: 100.0000 mg | ORAL_CAPSULE | Freq: Two times a day (BID) | ORAL | Status: DC

## 2012-10-14 MED ORDER — IOHEXOL 300 MG/ML  SOLN
80.0000 mL | Freq: Once | INTRAMUSCULAR | Status: AC | PRN
  Administered 2012-10-14: 80 mL via INTRAVENOUS

## 2012-10-14 MED ORDER — HYDROCODONE-ACETAMINOPHEN 5-325 MG PO TABS: 1.0000 | ORAL_TABLET | Freq: Four times a day (QID) | ORAL | Status: DC | PRN

## 2012-10-14 NOTE — ED Provider Notes (Signed)
CSN: [id number]     Arrival date & time 10/14/12  1902 History   First MD Initiated Contact with Patient 10/14/12 1909     Chief Complaint  Patient presents with  . Abdominal Pain   (Consider location/radiation/quality/duration/timing/severity/associated sxs/prior Treatment) HPI Patient presents emergency department with lower abdominal pain, worse on the left.  Patient, states she felt like she may be muscle since the pain radiated to both sides of her abdomen.  Patient denies nausea, vomiting, fever, chest pain, shortness of breath, back pain, dysuria, weakness, numbness, dizziness, headache, blurred vision, or syncope.  The patient, states, that she did not take any medications prior to arrival.  She says she has a history of kidney stone and ovarian cyst, but does not feel the same with this.  Patient, states, that palpation and movement make her pain, worse.  Her pain has been constant for the last week Past Medical History  Diagnosis Date  . UTI (lower urinary tract infection)   . MRSA (methicillin resistant staph aureus) culture positive   . Ovarian cyst   . Kidney stones    Past Surgical History  Procedure Laterality Date  . Finger surgery     No family history on file. History  Substance Use Topics  . Smoking status: Current Every Day Smoker    Types: Cigarettes  . Smokeless tobacco: Never Used  . Alcohol Use: No   OB History   Grav Para Term Preterm Abortions TAB SAB Ect Mult Living                 Review of Systems All other systems negative except as documented in the HPI. All pertinent positives and negatives as reviewed in the HPI. Allergies  Review of patient's allergies indicates no known allergies.  Home Medications   Current Outpatient Rx  Name  Route  Sig  Dispense  Refill  . Ibuprofen-Diphenhydramine HCl (ADVIL PM) 200-25 MG CAPS   Oral   Take by mouth.          BP 113/65  Pulse 85  Temp(Src) 98.4 F (36.9 C) (Oral)  Resp 16  SpO2 98%  LMP  10/07/2012 Physical Exam  Nursing note and vitals reviewed. Constitutional: She is oriented to person, place, and time. She appears well-developed and well-nourished. No distress.  HENT:  Head: Normocephalic and atraumatic.  Mouth/Throat: Oropharynx is clear and moist.  Eyes: Pupils are equal, round, and reactive to light.  Neck: Normal range of motion. Neck supple.  Cardiovascular: Normal rate, regular rhythm and normal heart sounds.  Exam reveals no gallop and no friction rub.   No murmur heard. Pulmonary/Chest: Effort normal and breath sounds normal. No respiratory distress.  Abdominal: Soft. Normal appearance and bowel sounds are normal. She exhibits no distension. There is tenderness. There is no rebound and no guarding.    Genitourinary: Vagina normal. There is no rash or tenderness on the right labia. There is no rash or tenderness on the left labia. Cervix exhibits discharge. Right adnexum displays no mass and no tenderness. Left adnexum displays no mass and no tenderness. No vaginal discharge found.  Patient had some mild tenderness to her cervix  Neurological: She is alert and oriented to person, place, and time.  Skin: Skin is warm and dry. No rash noted.    ED Course  Procedures (including critical care time) Labs Review Labs Reviewed  WET PREP, GENITAL - Abnormal; Notable for the following:    Clue Cells Wet Prep HPF POC RARE (*)  All other components within normal limits  COMPREHENSIVE METABOLIC PANEL - Abnormal; Notable for the following:    Albumin 3.4 (*)    All other components within normal limits  URINALYSIS, ROUTINE W REFLEX MICROSCOPIC - Abnormal; Notable for the following:    Leukocytes, UA SMALL (*)    All other components within normal limits  GC/CHLAMYDIA PROBE AMP  CBC WITH DIFFERENTIAL  URINE MICROSCOPIC-ADD ON   Imaging Review US Transvaginal Non-ob  10/14/2012   CLINICAL DATA:  Abdominal pain  EXAM: TRANSABDOMINAL AND TRANSVAGINAL ULTRASOUND OF  PELVIS  TECHNIQUE: Both transabdominal and transvaginal ultrasound examinations of the pelvis were performed. Transabdominal technique was performed for global imaging of the pelvis including uterus, ovaries, adnexal regions, and pelvic cul-de-sac. It was necessary to proceed with endovaginal exam following the transabdominal exam to visualize the uterus, endometrium, ovaries and adnexa.  COMPARISON:  None  FINDINGS: Uterus  Measurements: 7.3 x 2.6 x 3.9 cm. No fibroids or other mass visualized. Cervix appears mildly prominent with tenderness during the exam. Recommend clinical correlation with direct visualization on pelvic exam.  Endometrium  Thickness: 3 mm.  No focal abnormality visualized.  Right ovary  Measurements: 4.1 x 2.6 x 2.5 cm. Multiple small follicles. Normal appearance/no adnexal mass.  Left ovary  Measurements: 4.2 x 2.9 x 2.6 cm. Multiple small follicles. Normal appearance/no adnexal mass.  Other findings  No free fluid.  IMPRESSION: Cervix appears mildly prominent with tenderness during the exam. Recommend clinical correlation with physical exam.  Otherwise unremarkable.   Electronically Signed   By: Charlett Nose M.D.   On: 10/14/2012 21:25   US Pelvis Complete  10/14/2012   CLINICAL DATA:  Abdominal pain  EXAM: TRANSABDOMINAL AND TRANSVAGINAL ULTRASOUND OF PELVIS  TECHNIQUE: Both transabdominal and transvaginal ultrasound examinations of the pelvis were performed. Transabdominal technique was performed for global imaging of the pelvis including uterus, ovaries, adnexal regions, and pelvic cul-de-sac. It was necessary to proceed with endovaginal exam following the transabdominal exam to visualize the uterus, endometrium, ovaries and adnexa.  COMPARISON:  None  FINDINGS: Uterus  Measurements: 7.3 x 2.6 x 3.9 cm. No fibroids or other mass visualized. Cervix appears mildly prominent with tenderness during the exam. Recommend clinical correlation with direct visualization on pelvic exam.   Endometrium  Thickness: 3 mm.  No focal abnormality visualized.  Right ovary  Measurements: 4.1 x 2.6 x 2.5 cm. Multiple small follicles. Normal appearance/no adnexal mass.  Left ovary  Measurements: 4.2 x 2.9 x 2.6 cm. Multiple small follicles. Normal appearance/no adnexal mass.  Other findings  No free fluid.  IMPRESSION: Cervix appears mildly prominent with tenderness during the exam. Recommend clinical correlation with physical exam.  Otherwise unremarkable.   Electronically Signed   By: Charlett Nose M.D.   On: 10/14/2012 21:25   Ct Abdomen Pelvis W Contrast  10/14/2012   CLINICAL DATA:  Abdominal pain.  EXAM: CT ABDOMEN AND PELVIS WITH CONTRAST  TECHNIQUE: Multidetector CT imaging of the abdomen and pelvis was performed using the standard protocol following bolus administration of intravenous contrast.  CONTRAST:  80mL OMNIPAQUE IOHEXOL 300 MG/ML  SOLN  COMPARISON:  Ultrasound 10/14/2012  FINDINGS: Liver, spleen, gallbladder, pancreas, adrenals and kidneys are normal. Appendix is not definitively seen. No inflammatory process in the right lower quadrant. Uterus and ovaries are unremarkable. Multiple small follicles in the ovaries bilaterally. No free fluid, free air or adenopathy. Large and small bowel are grossly unremarkable. Aorta is normal caliber.  No acute bony abnormality.  IMPRESSION: No acute findings in the abdomen or pelvis.   Electronically Signed   By: Charlett Nose M.D.   On: 10/14/2012 23:16   patient had some cervical tenderness on exam.  Patient be given treatment for possible low-level PID.  Patient is advised to return here as needed.  She is given resources for primary care followup CT scan and Lab testing was normal.  MDM      Carlyle Dolly, PA-C 10/14/12 2341

## 2012-10-14 NOTE — ED Notes (Signed)
Patient transported to CT 

## 2012-10-14 NOTE — ED Notes (Signed)
Patient transported to US 

## 2012-10-14 NOTE — Progress Notes (Signed)
Patient confirms she does not have a pcp or insurance.  [facility] provided patient with a list of pcps who accept self pay patients, list of discounted pharmacies and website needymeds.org for medication assistance, list of financial assistance in the community such as [employer], list of resources for crisis intervention, and information regarding Affordable Care Act and Medicaid for insurance.  EDCM also asked patient if she would be interested in receiving information on th orange card.  Patient agreeable to receive this information.  EDCM will ask [facility] representative to send patient information on the orange card.  Patient thankful for resources.  No further needs at this time.

## 2012-10-14 NOTE — ED Notes (Signed)
Pt c/o lower abdominal pain which is worse on the L side. Pain is sharp and constant. Pt states pain is worse with intercourse. Symptoms for one week. Pt denies n/v/d. Pt alert, skin warm and dry. No acute distress.

## 2012-10-15 LAB — GC/CHLAMYDIA PROBE AMP
CT Probe RNA: POSITIVE — AB
GC Probe RNA: NEGATIVE

## 2012-10-15 NOTE — ED Provider Notes (Signed)
Medical screening examination/treatment/procedure(s) were performed by non-physician practitioner and as supervising physician I was immediately available for consultation/collaboration.   Rolan Bucco, MD 10/15/12 0000

## 2012-10-16 ENCOUNTER — Telehealth (HOSPITAL_COMMUNITY): Payer: Self-pay | Admitting: Emergency Medicine

## 2012-10-16 NOTE — ED Notes (Signed)
Patient has +Chlamydia. 

## 2012-10-17 ENCOUNTER — Telehealth (HOSPITAL_COMMUNITY): Payer: Self-pay | Admitting: Emergency Medicine

## 2012-10-22 NOTE — ED Notes (Signed)
Unable to contact via phone-Letter Sent to [facility] address.

## 2012-11-04 ENCOUNTER — Emergency Department: Payer: Self-pay | Admitting: Emergency Medicine

## 2012-11-04 IMAGING — CR DG KNEE COMPLETE 4+V*R*
1 series · 4 of 4 positions shown · non-contrast
Comparison: none

REASON FOR EXAM: pain 2nd to fall with history of int fixation last year
COMMENTS:   May transport without cardiac monitor

[Series 1: ap · 0.17mm/px · 4 of 4 slices shown]
[im 1/4]
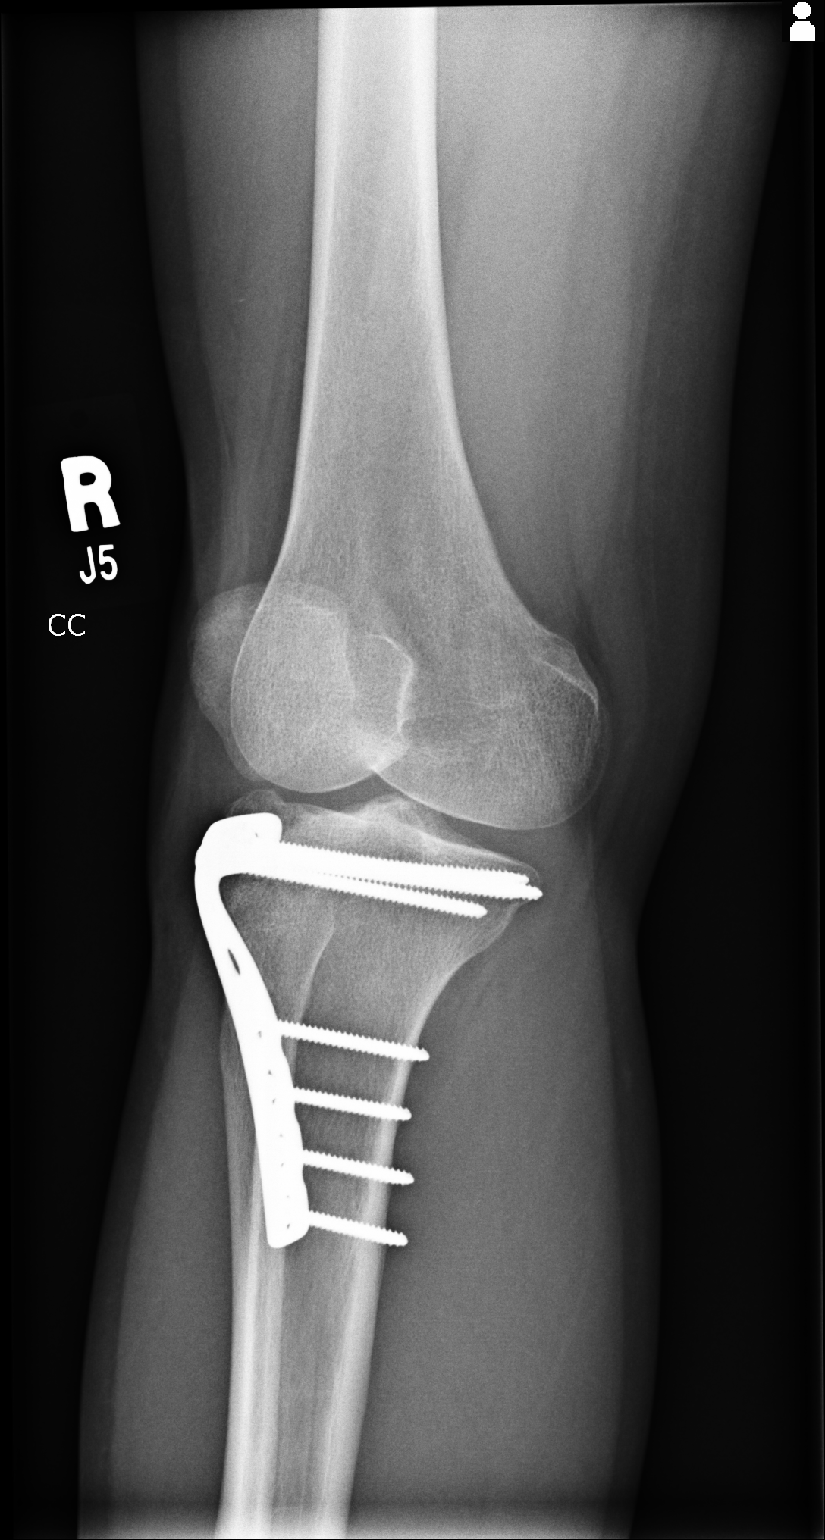
[im 2/4]
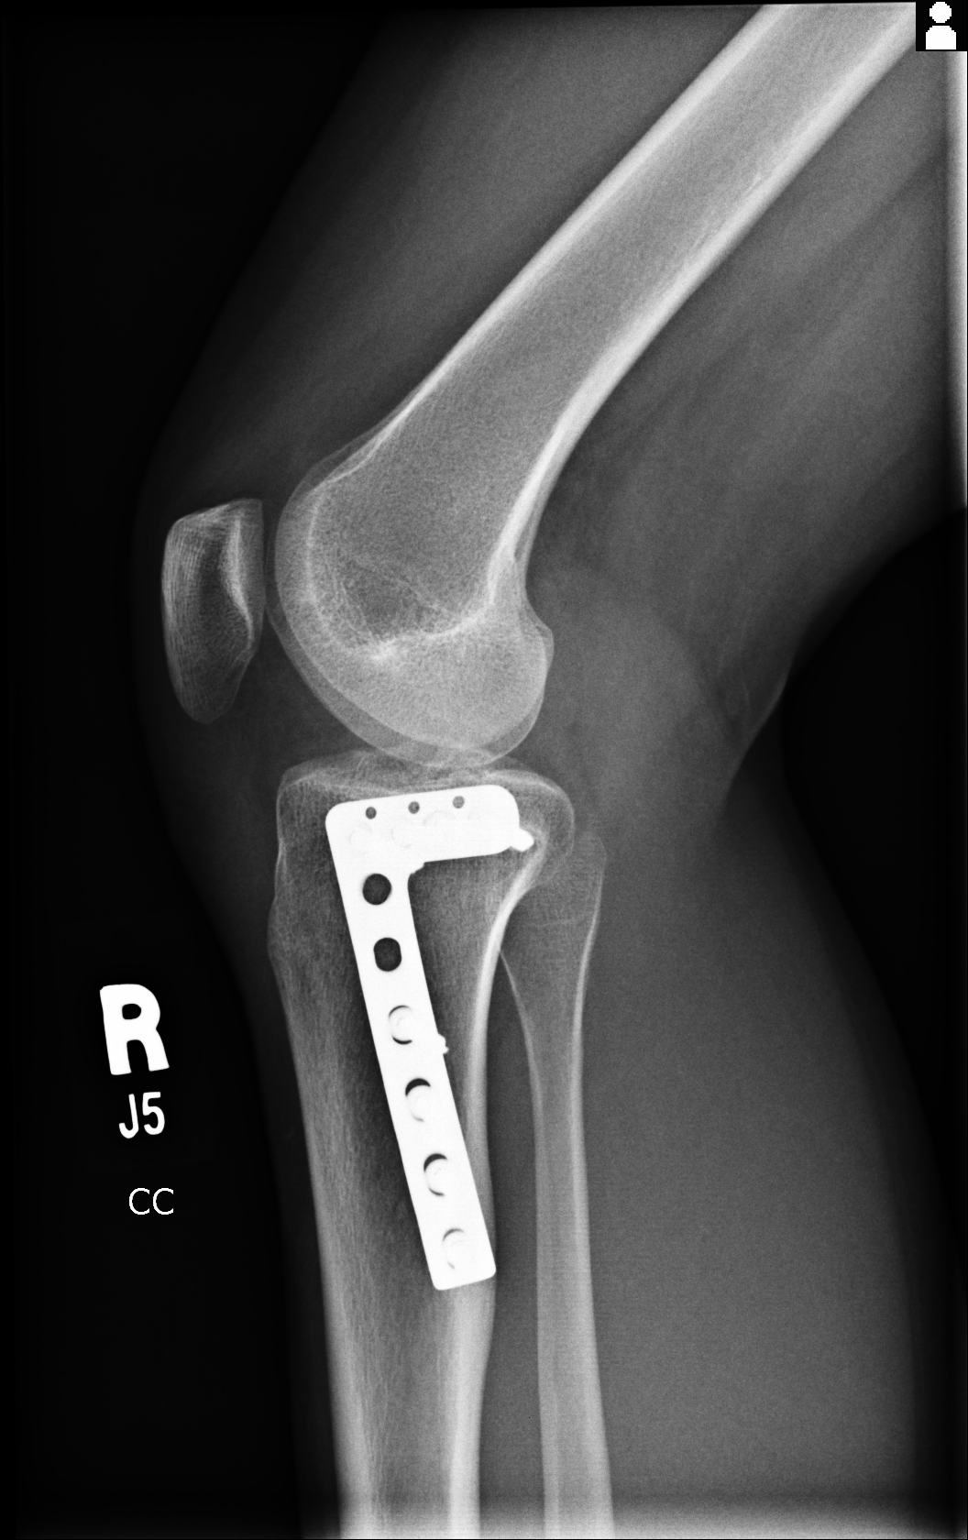
[im 3/4]
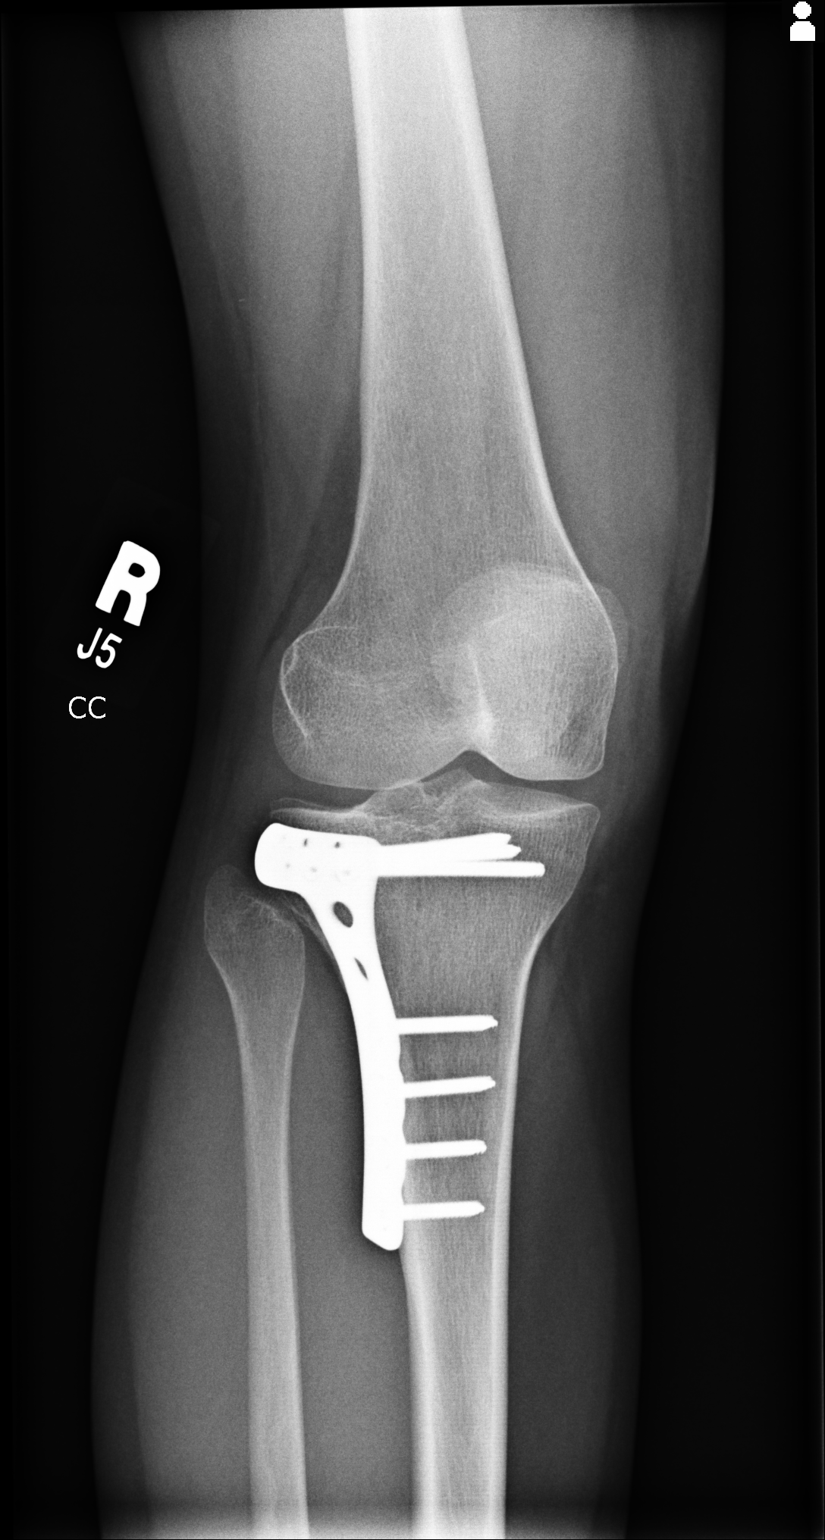
[im 4/4]
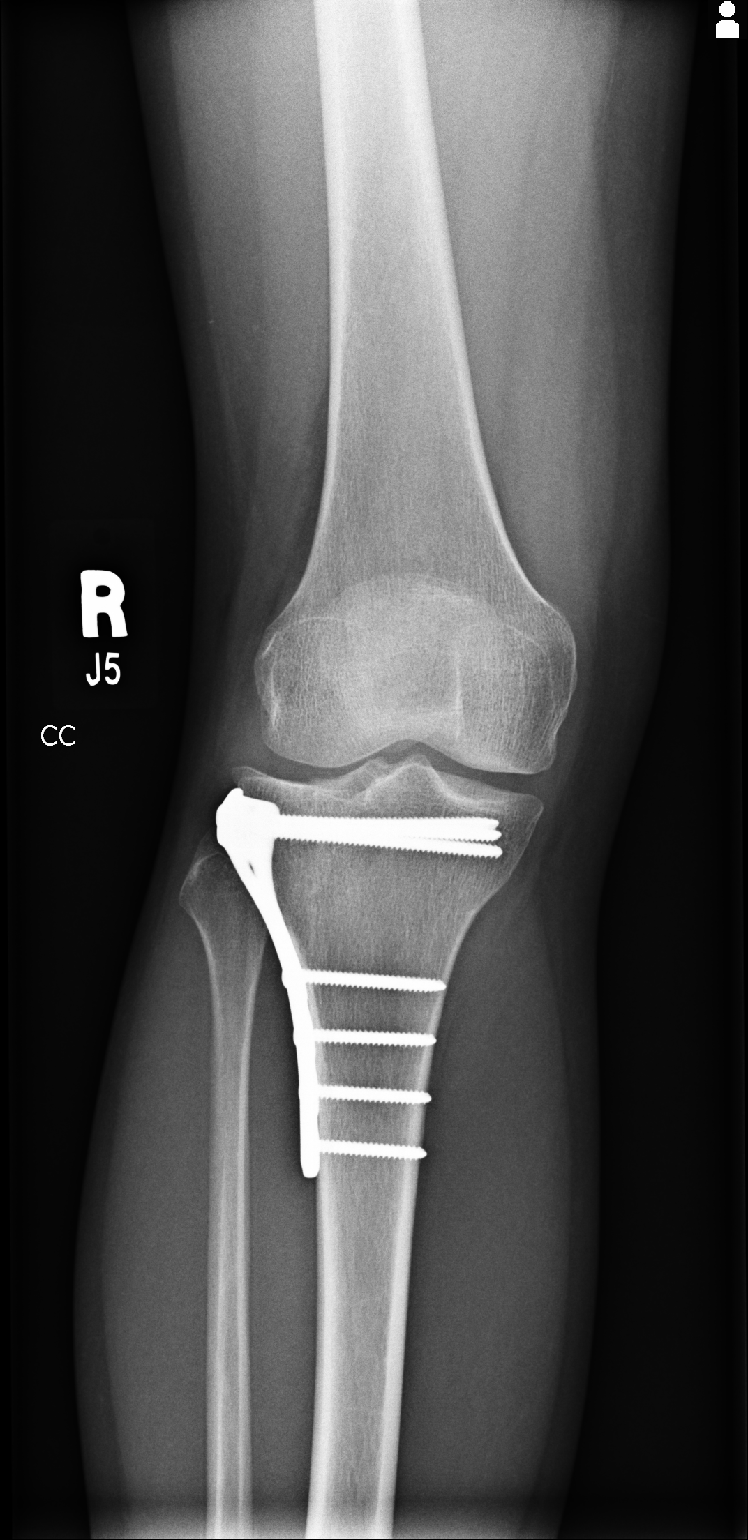

[4 of 4 positions shown; findings below may reference images not displayed]

PROCEDURE:     DXR - DXR KNEE RT COMP WITH OBLIQUES  - [DATE] [DATE]

RESULT:     Four views of the right knee reveal the bones to be adequately
mineralized. The metallic hardware from the previous proximal tibial
fracture stabilization appears intact. There is no evidence of an acute
fracture. The overlying soft tissues are normal in appearance.
IMPRESSION: There is no acute bony abnormality of the right knee.

## 2013-01-04 ENCOUNTER — Emergency Department: Payer: Self-pay | Admitting: Emergency Medicine

## 2013-01-04 LAB — URINALYSIS, COMPLETE
Bilirubin,UR: NEGATIVE
Glucose,UR: NEGATIVE mg/dL (ref 0–75)
Ketone: NEGATIVE
Nitrite: NEGATIVE
Ph: 5 (ref 4.5–8.0)
Protein: 100
Specific Gravity: 1.016 (ref 1.003–1.030)
WBC UR: 947 /HPF (ref 0–5)

## 2013-04-15 ENCOUNTER — Encounter (HOSPITAL_COMMUNITY): Payer: Self-pay | Admitting: Emergency Medicine

## 2013-04-15 ENCOUNTER — Emergency Department (HOSPITAL_COMMUNITY)
Admission: EM | Admit: 2013-04-15 | Discharge: 2013-04-15 | Disposition: A | Discharge status: Home / Self Care | Payer: Self-pay | Attending: Emergency Medicine | Admitting: Emergency Medicine

## 2013-04-15 DIAGNOSIS — Y9289 Other specified places as the place of occurrence of the external cause: Secondary | ICD-10-CM | POA: Insufficient documentation

## 2013-04-15 DIAGNOSIS — Z87440 Personal history of urinary (tract) infections: Secondary | ICD-10-CM | POA: Insufficient documentation

## 2013-04-15 DIAGNOSIS — T43591A Poisoning by other antipsychotics and neuroleptics, accidental (unintentional), initial encounter: Secondary | ICD-10-CM | POA: Insufficient documentation

## 2013-04-15 DIAGNOSIS — Z87442 Personal history of urinary calculi: Secondary | ICD-10-CM | POA: Insufficient documentation

## 2013-04-15 DIAGNOSIS — F1110 Opioid abuse, uncomplicated: Secondary | ICD-10-CM | POA: Insufficient documentation

## 2013-04-15 DIAGNOSIS — Z8742 Personal history of other diseases of the female genital tract: Secondary | ICD-10-CM | POA: Insufficient documentation

## 2013-04-15 DIAGNOSIS — Z8614 Personal history of Methicillin resistant Staphylococcus aureus infection: Secondary | ICD-10-CM | POA: Insufficient documentation

## 2013-04-15 DIAGNOSIS — F17200 Nicotine dependence, unspecified, uncomplicated: Secondary | ICD-10-CM | POA: Insufficient documentation

## 2013-04-15 DIAGNOSIS — F411 Generalized anxiety disorder: Secondary | ICD-10-CM | POA: Insufficient documentation

## 2013-04-15 DIAGNOSIS — F1910 Other psychoactive substance abuse, uncomplicated: Secondary | ICD-10-CM

## 2013-04-15 DIAGNOSIS — T40904A Poisoning by unspecified psychodysleptics [hallucinogens], undetermined, initial encounter: Secondary | ICD-10-CM | POA: Insufficient documentation

## 2013-04-15 DIAGNOSIS — Y9389 Activity, other specified: Secondary | ICD-10-CM | POA: Insufficient documentation
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
## 2013-04-15 NOTE — ED Notes (Signed)
Dr. Dierdre Highmanpitz speaking with family members at bedside

## 2013-04-15 NOTE — ED Notes (Signed)
MD at bedside  Patient on the phone, refusing to end phone call

## 2013-04-15 NOTE — ED Notes (Signed)
Obtained verbal consent from patient to speak with her mother and relay information r/t ED visit Per patient's mother, patient has had an ongoing issue with drug abuse for over one year for which she has sought treatment for without success Patient's mother states that she is on the way to the ED to see patient and speak with Dr. Milderd Meagerpitz Will make MD aware

## 2013-04-15 NOTE — ED Notes (Signed)
Patient resting in position of comfort with eyes closed RR WNL--even and unlabored with equal rise and fall of chest Patient in NAD Side rails up, call bell in reach  Awaiting arrival of family members
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
## 2013-04-15 NOTE — ED Notes (Addendum)
Patient arrives to ED via [facility] after patient ingested candy laced with LSD Per EMS, patient states that she wasn't aware that candy was laced with LSD and has been trying to avoid illicit drug use and drug users Patient is obviously intoxicated/under the influence, but in NAD

## 2013-04-15 NOTE — Discharge Instructions (Signed)
Chemical Dependency °Chemical dependency is an addiction to drugs or alcohol. It is characterized by the repeated behavior of seeking out and using drugs and alcohol despite harmful consequences to the health and safety of ones self and others.  °RISK FACTORS °There are certain situations or behaviors that increase a person's risk for chemical dependency. These include: °· A family history of chemical dependency. °· A history of mental health issues, including depression and anxiety. °· A home environment where drugs and alcohol are easily available to you. °· Drug or alcohol use at a young age. °SYMPTOMS  °The following symptoms can indicate chemical dependency: °· Inability to limit the use of drugs or alcohol. °· Nausea, sweating, shakiness, and anxiety that occurs when alcohol or drugs are not being used. °· An increase in amount of drugs or alcohol that is necessary to get drunk or high. °People who experience these symptoms can assess their use of drugs and alcohol by asking themselves the following questions: °· Have you been told by friends or family that they are worried about your use of alcohol or drugs? °· Do friends and family ever tell you about things you did while drinking alcohol or using drugs that you do not remember? °· Do you lie about using alcohol or drugs or about the amounts you use? °· Do you have difficulty completing daily tasks unless you use alcohol or drugs? °· Is the level of your work or school performance lower because of your drug or alcohol use? °· Do you get sick from using drugs or alcohol but keep using anyway? °· Do you feel uncomfortable in social situations unless you use alcohol or drugs? °· Do you use drugs or alcohol to help forget problems?  °An answer of yes to any of these questions may indicate chemical dependency. Professional evaluation is suggested. °Document Released: 12/24/2000 Document Revised: 03/24/2011 Document Reviewed: 03/07/2010 °ExitCare® Patient  Information ©2014 [employer]. ° ° ° ° ° °Emergency Department Resource Guide °1) Find a Doctor and Pay Out of Pocket °Although you won't have to find out who is covered by your insurance plan, it is a good idea to ask around and get recommendations. You will then need to call the office and see if the doctor you have chosen will accept you as a new patient and what types of options they offer for patients who are self-pay. Some doctors offer discounts or will set up payment plans for their patients who do not have insurance, but you will need to ask so you aren't surprised when you get to your appointment. ° °2) Contact Your Local Health Department °Not all health departments have doctors that can see patients for sick visits, but many do, so it is worth a call to see if yours does. If you don't know where your local health department is, you can check in your phone book. The CDC also has a tool to help you locate your state's health department, and many state websites also have listings of all of their local health departments. ° °3) Find a Walk-in Clinic °If your illness is not likely to be very severe or complicated, you may want to try a walk in clinic. These are popping up all over the country in pharmacies, drugstores, and shopping centers. They're usually staffed by nurse practitioners or physician assistants that have been trained to treat common illnesses and complaints. They're usually fairly quick and inexpensive. However, if you have serious medical issues or chronic medical problems, these   are probably not your best option. ° °No Primary Care Doctor: °- Call [facility] at  832-8000 - they can help you locate a primary care doctor that  accepts your insurance, provides certain services, etc. °- Physician Referral Service- 1-800-533-3463 ° °Chronic Pain Problems: °Organization         Address  Phone   Notes  °[facility]  (336) 297-2271 Patients need to be referred by their  primary care doctor.  ° °Medication Assistance: °Organization         Address  Phone   Notes  °[place] [facility] [address] °[place], [place] [address] (336) 641-8030 --Must be a resident of [place] °-- Must have NO insurance coverage whatsoever (no Medicaid/ Medicare, etc.) °-- The pt. MUST have a primary care doctor that directs their care regularly and follows them in the community °  °[facility]  (866) 331-1348   °[employer]  (888) 892-1162   ° °Agencies that provide inexpensive medical care: °Organization         Address  Phone   Notes  °[facility]  (336) 832-8035   °[facility]    (336) 832-7272   °[facility] [address] °[place], [place] [address] (336) 832-4777   °[facility] [address], °[place] (336) 271-4999   °[facility]    (336) 373-0678   °[facility]    (336) 272-1050   °[facility] ° [address], [place] Phone:  (336) 832-4444, Fax:  (336) 832-4440 Hours of Operation:  9 am - 6 pm, M-F.  Also accepts Medicaid/Medicare and self-pay.  °[facility] ° [address], [place] Phone: (336) 832-3150, Fax: (336) 832-3151. Hours of Operation:  8:30 am - 5:30 pm, M-F.  Also accepts Medicaid and self-pay.  °[facility] [place] [address], [place] Phone: (336) 878-6027   °[facility] [address], [place], [place] (336)723-1848, Ext. 123 Mondays & Thursdays: 7-9 AM.  First 15 patients are seen on a first come, first serve basis. °  ° °Medicaid-accepting [place] Providers: ° °Organization         Address  Phone   Notes  °[facility] [address], [place] (336) 641-2100 Also accepts self-pay patients.  °[facility] [address], [place] ° (336) 856-9996   °[facility] [address], [place] (336) 288-8857   °[facility] [address] [place] [address], [place] (336) 299-7000   °Veita Bland [address], [place]  ° (336) 373-1557 Only accepts [place] Access Medicaid patients after they have their name applied to their card.  ° °Self-Pay (no insurance) in [place]: ° °Organization         Address  Phone   Notes  °Sickle Cell Patients, [facility] [address], [place] (336) 832-1970   °[facility] [address], [place] (336) 832-4400   °[facility] ° [address], [place] (336) 992-4800   °[facility]/Dr. Osei-Bonsu ° [address] [place] [address], [place] or [address], [place] (336) 841-8500 Phone number for both [place] and  locations is the same.  °[facility] [address], [place] (336) 299-0000   °[facility] [address] [place] [address], [place] or [address] (336) 852-7530 °(336) 878-2260   °[facility] [address], [place] (336) 350-1642, phone; (336) 294-5005, fax Sees patients 1st and 3rd Saturday of every month.  Must not qualify for public or private insurance (i.e. Medicaid, Medicare, [place] Health Choice, Veterans' Benefits) • Household income should be no more than 200% of the poverty level •The clinic cannot treat you if you are pregnant or think you are pregnant • Sexually transmitted diseases are not treated at the clinic.  ° ° °Dental Care: °Organization         Address  Phone  Notes  °[facility] [facility] [address], [place] (336) 641-6152 Accepts children up to age 21 who are enrolled in Medicaid or [place] Health Choice; pregnant women with a Medicaid card; and children who have applied for Medicaid or [place] Health Choice, but were declined, whose parents can pay a reduced fee  at time of service.  °[facility] [place]  [address], [place] (336) 641-7733 Accepts children up to age 21 who are enrolled in Medicaid or [place] Health Choice; pregnant women with a Medicaid card; and children who have applied for Medicaid or [place] Health Choice, but were declined, whose parents can pay a reduced fee at time of service.  °[facility] ° [address], [place] (336) 641-4533 Patients are seen by appointment only. Walk-ins are not accepted. [facility] will see patients 18 years of age and older. °Monday - Tuesday (8am-5pm) °Most Wednesdays (8:30-5pm) °$30 per visit, cash only  °[facility] ° [address], [place] (336) 641-4533 Patients are seen by appointment only. Walk-ins are not accepted. [facility] will see patients 18 years of age and older. °One Wednesday Evening (Monthly: Volunteer Based).  $30 per visit, cash only  °[facility]  (919) 537-3737 for adults; Children under age 4, call Graduate Pediatric Dentistry at (919) [phone]. Children aged 4-14, please call (919) 537-3737 to request a pediatric application. ° Dental services are provided in all areas of dental care including fillings, crowns and bridges, complete and partial dentures, implants, gum treatment, root canals, and extractions. Preventive care is also provided. Treatment is provided to both adults and children. °Patients are selected via a lottery and there is often a waiting list. °  °[facility] [address], ° ° (336) 763-8833 [email or web address] °  °[facility] [address], [place], [place] (336)723-1848, Ext. 123 Second and Fourth Thursday of each month, opens at 6:30 AM; Clinic ends at 9 AM.  Patients are seen on a first-come first-served basis, and a limited number are seen during each clinic.  ° °[facility] ° [address],  [place], [place] (336) 723-7904   Eligibility Requirements °You must have lived in [place], [place], or [place] counties for at least the last three months. °  You cannot be eligible for state or federal sponsored healthcare insurance, including Veterans Administration, Medicaid, or Medicare. °  You generally cannot be eligible for healthcare insurance through your employer.  °  How to apply: °Eligibility screenings are held every Tuesday and Wednesday afternoon from 1:00 pm until 4:00 pm. You do not need an appointment for the interview!  °[facility] [address], [place], [place] 336-631-2330   °[facility]  336-342-8273   °[facility]  336-703-3100   °[facility]  336-570-6415   ° °Behavioral Health Resources in the Community: °Intensive Outpatient Programs °Organization         Address  Phone  Notes  °[facility] [address], [place], [place] 336-878-6098   °[facility] [address], [place], [place] 336-832-9800   °[facility] [address], [place], [place] ° 336-882-2125   °[facility] [address],  °[place], [place] 1-800-853-5163 or 336-641-4981   °Substance Abuse Resources °Organization         Address  Phone  Notes  °[facility]  336-882-2125   °[facility]  336-784-9470   °[facility]  336-285-9073   °[facility]  336-845-3988   °Residential & Outpatient Substance Abuse Program  1-800-659-3381   °Psychological Services °Organization         Address  Phone  Notes  °[facility]  336- 832-9600   °[facility]  336- 378-7881   °[facility] [address], [place] 1-800-853-5163 or 336-641-4981   ° °Mobile Crisis Teams °Organization         Address  Phone  Notes  °[facility], Mobile Crisis Care Unit  1-877-626-1772   °Assertive °[facility]otherapeutic  [facility] ° [address] [place], [place] 336-834-9664   °Sharon DeEsch [address] °[place] 336-554-5454   ° °Self-Help/Support Groups °Organization         Address  Phone             Notes  °[facility] - variety of support groups  336- 373-1402 Call for more information  °Narcotics Anonymous (NA), [facility] [address], °[place]  2 meetings at this location  ° °Residential Treatment Programs °Organization         Address  Phone  Notes  °[facility] [address],    °[place]  1-866-801-8205   °[facility] ° [address], [place], [place] 704-293-8524   °[facility] [address], [place] 336-845-3988 Admissions: 8am-3pm M-F  °[facility] [address]    °[place], [place] 336-841-1104   °[facility] [address], [place], [place] 336-379-7146   °[facility] [address]  °[place], [place] 336-285-9073   °[facility] - Intensive Outpatient [address], [place], [place] 336-852-3033   °[facility] [address]  °[place], [place] 1-877-615-2722 or 336-784-9470   °[facility] [address] [place], [place] 336-227-7417 Accepts Medicaid  °[facility] [address]  ° [place] 1-800-659-3381 Substance Abuse/Addiction Treatment  ° °[place] Behavioral Health Resources °Organization         Address  Phone  Notes  °[facility]  (888) 581-9988   °Julie Brannon, PhD [address] [place], [place]   (336) 349-5553 or (336) 951-0000   °[facility]   [address] °[place], [place] (336) 349-4454   °[facility] [address], [place], [place] (336) 342-8316 Insurance/Medicaid/sponsorship through [facility]  °[facility] [address]                                      [place], [place] (336)  342-8316 Therapy/tele-psych/case  °[facility] [address]  ° [place], [place] (336) 349-2233    °Dr. Arfeen  (336) 349-4544   °[facility] [place]  [employer] [place] [facility] 1) [address], [place] °2) [address], [place] °3)  [address], [place] (336) 349-3220 °(336) 342-7768 ° °(336) 342-8140   °[place] [facility] (336) 342-1394 or (336) 342-3537 (After Hours)    ° ° ° °

## 2013-04-15 NOTE — ED Notes (Signed)
Bed: [id number] Expected date:  Expected time:  Means of arrival:  Comments: EMS pt was given acid laced candy and having complications

## 2013-06-14 ENCOUNTER — Emergency Department: Payer: Self-pay | Admitting: Emergency Medicine

## 2013-06-14 LAB — DRUG SCREEN, URINE
Amphetamines, Ur Screen: POSITIVE (ref ?–1000)
Barbiturates, Ur Screen: NEGATIVE (ref ?–200)
Benzodiazepine, Ur Scrn: NEGATIVE (ref ?–200)
CANNABINOID 50 NG, UR ~~LOC~~: NEGATIVE (ref ?–50)
COCAINE METABOLITE, UR ~~LOC~~: NEGATIVE (ref ?–300)
MDMA (Ecstasy)Ur Screen: NEGATIVE (ref ?–500)
Methadone, Ur Screen: NEGATIVE (ref ?–300)
Opiate, Ur Screen: NEGATIVE (ref ?–300)
PHENCYCLIDINE (PCP) UR S: NEGATIVE (ref ?–25)
TRICYCLIC, UR SCREEN: NEGATIVE (ref ?–1000)

## 2013-06-14 LAB — COMPREHENSIVE METABOLIC PANEL
ALT: 84 U/L — AB (ref 12–78)
ANION GAP: 4 — AB (ref 7–16)
Albumin: 4.1 g/dL (ref 3.4–5.0)
Alkaline Phosphatase: 94 U/L
BUN: 23 mg/dL — AB (ref 7–18)
Bilirubin,Total: 0.7 mg/dL (ref 0.2–1.0)
CO2: 28 mmol/L (ref 21–32)
Calcium, Total: 9.4 mg/dL (ref 8.5–10.1)
Chloride: 103 mmol/L (ref 98–107)
Creatinine: 1.06 mg/dL (ref 0.60–1.30)
EGFR (Non-African Amer.): >60
Glucose: 73 mg/dL (ref 65–99)
OSMOLALITY: 272 (ref 275–301)
POTASSIUM: 3.5 mmol/L (ref 3.5–5.1)
SGOT(AST): 71 U/L — ABNORMAL HIGH (ref 15–37)
Sodium: 135 mmol/L — ABNORMAL LOW (ref 136–145)
Total Protein: 8.9 g/dL — ABNORMAL HIGH (ref 6.4–8.2)

## 2013-06-14 LAB — CBC
HCT: 47.8 % — AB (ref 35.0–47.0)
HGB: 15.8 g/dL (ref 12.0–16.0)
MCH: 29.2 pg (ref 26.0–34.0)
MCHC: 33.1 g/dL (ref 32.0–36.0)
MCV: 88 fL (ref 80–100)
Platelet: 264 10*3/uL (ref 150–440)
RBC: 5.41 10*6/uL — AB (ref 3.80–5.20)
RDW: 13.5 % (ref 11.5–14.5)
WBC: 8.2 10*3/uL (ref 3.6–11.0)

## 2013-06-14 LAB — URINALYSIS, COMPLETE
BILIRUBIN, UR: NEGATIVE
Bacteria: NONE SEEN
Blood: NEGATIVE
Glucose,UR: NEGATIVE mg/dL (ref 0–75)
KETONE: NEGATIVE
LEUKOCYTE ESTERASE: NEGATIVE
Nitrite: NEGATIVE
PH: 6 (ref 4.5–8.0)
PROTEIN: NEGATIVE
RBC,UR: NONE SEEN /HPF (ref 0–5)
Specific Gravity: 1.004 (ref 1.003–1.030)
Squamous Epithelial: 1
WBC UR: NONE SEEN /HPF (ref 0–5)

## 2013-06-14 LAB — PREGNANCY, URINE: PREGNANCY TEST, URINE: NEGATIVE m[IU]/mL

## 2013-06-14 LAB — ACETAMINOPHEN LEVEL: Acetaminophen: 3 ug/mL — ABNORMAL LOW

## 2013-06-14 LAB — ETHANOL: Ethanol %: <0.003 % (ref 0.000–0.080)

## 2013-06-14 LAB — SALICYLATE LEVEL

## 2013-06-27 ENCOUNTER — Emergency Department: Payer: Self-pay | Admitting: Emergency Medicine

## 2013-06-27 LAB — COMPREHENSIVE METABOLIC PANEL
ALBUMIN: 3.7 g/dL (ref 3.4–5.0)
ANION GAP: 9 (ref 7–16)
AST: 57 U/L — AB (ref 15–37)
Alkaline Phosphatase: 78 U/L
BILIRUBIN TOTAL: 0.3 mg/dL (ref 0.2–1.0)
BUN: 11 mg/dL (ref 7–18)
CALCIUM: 9.2 mg/dL (ref 8.5–10.1)
Chloride: 103 mmol/L (ref 98–107)
Co2: 26 mmol/L (ref 21–32)
Creatinine: 0.61 mg/dL (ref 0.60–1.30)
EGFR (Non-African Amer.): >60
Glucose: 70 mg/dL (ref 65–99)
Osmolality: 273 (ref 275–301)
Potassium: 4.3 mmol/L (ref 3.5–5.1)
SGPT (ALT): 94 U/L — ABNORMAL HIGH (ref 12–78)
SODIUM: 138 mmol/L (ref 136–145)
Total Protein: 7.5 g/dL (ref 6.4–8.2)

## 2013-06-27 LAB — DRUG SCREEN, URINE
Amphetamines, Ur Screen: NEGATIVE (ref ?–1000)
Barbiturates, Ur Screen: NEGATIVE (ref ?–200)
Benzodiazepine, Ur Scrn: NEGATIVE (ref ?–200)
CANNABINOID 50 NG, UR ~~LOC~~: POSITIVE (ref ?–50)
Cocaine Metabolite,Ur ~~LOC~~: NEGATIVE (ref ?–300)
MDMA (Ecstasy)Ur Screen: NEGATIVE (ref ?–500)
Methadone, Ur Screen: NEGATIVE (ref ?–300)
Opiate, Ur Screen: NEGATIVE (ref ?–300)
Phencyclidine (PCP) Ur S: NEGATIVE (ref ?–25)
Tricyclic, Ur Screen: NEGATIVE (ref ?–1000)

## 2013-06-27 LAB — URINALYSIS, COMPLETE
BLOOD: NEGATIVE
Bacteria: NONE SEEN
Bilirubin,UR: NEGATIVE
Glucose,UR: NEGATIVE mg/dL (ref 0–75)
KETONE: NEGATIVE
Leukocyte Esterase: NEGATIVE
NITRITE: NEGATIVE
Ph: 7 (ref 4.5–8.0)
Protein: NEGATIVE
RBC, UR: NONE SEEN /HPF (ref 0–5)
SPECIFIC GRAVITY: 1.009 (ref 1.003–1.030)
WBC UR: NONE SEEN /HPF (ref 0–5)

## 2013-06-27 LAB — CBC
HCT: 40.3 % (ref 35.0–47.0)
HGB: 13.4 g/dL (ref 12.0–16.0)
MCH: 29.7 pg (ref 26.0–34.0)
MCHC: 33.3 g/dL (ref 32.0–36.0)
MCV: 89 fL (ref 80–100)
Platelet: 237 10*3/uL (ref 150–440)
RBC: 4.53 10*6/uL (ref 3.80–5.20)
RDW: 13.7 % (ref 11.5–14.5)
WBC: 6.6 10*3/uL (ref 3.6–11.0)

## 2013-06-27 LAB — ETHANOL: Ethanol: <3 mg/dL

## 2013-06-27 LAB — ACETAMINOPHEN LEVEL: Acetaminophen: <2 ug/mL

## 2013-06-27 LAB — SALICYLATE LEVEL: Salicylates, Serum: 2.7 mg/dL

## 2014-01-23 ENCOUNTER — Emergency Department: Payer: Self-pay | Admitting: Emergency Medicine

## 2014-01-23 LAB — URINALYSIS, COMPLETE
BACTERIA: NONE SEEN
BILIRUBIN, UR: NEGATIVE
BLOOD: NEGATIVE
GLUCOSE, UR: NEGATIVE mg/dL (ref 0–75)
LEUKOCYTE ESTERASE: NEGATIVE
Nitrite: NEGATIVE
Ph: 5 (ref 4.5–8.0)
Protein: NEGATIVE
Specific Gravity: 1.023 (ref 1.003–1.030)
Squamous Epithelial: 2
WBC UR: 1 /HPF (ref 0–5)

## 2014-01-23 LAB — WET PREP, GENITAL

## 2014-01-23 LAB — GC/CHLAMYDIA PROBE AMP

## 2014-01-27 ENCOUNTER — Emergency Department: Payer: Self-pay | Admitting: Emergency Medicine

## 2014-01-27 LAB — CBC WITH DIFFERENTIAL/PLATELET
Basophil #: 0.1 10*3/uL (ref 0.0–0.1)
Basophil %: 1.2 %
Eosinophil #: 0.2 10*3/uL (ref 0.0–0.7)
Eosinophil %: 4.1 %
HCT: 44.1 % (ref 35.0–47.0)
HGB: 14.4 g/dL (ref 12.0–16.0)
LYMPHS ABS: 2.1 10*3/uL (ref 1.0–3.6)
Lymphocyte %: 36.3 %
MCH: 29.7 pg (ref 26.0–34.0)
MCHC: 32.7 g/dL (ref 32.0–36.0)
MCV: 91 fL (ref 80–100)
MONOS PCT: 11.8 %
Monocyte #: 0.7 x10 3/mm (ref 0.2–0.9)
NEUTROS PCT: 46.6 %
Neutrophil #: 2.7 10*3/uL (ref 1.4–6.5)
PLATELETS: 228 10*3/uL (ref 150–440)
RBC: 4.86 10*6/uL (ref 3.80–5.20)
RDW: 13.3 % (ref 11.5–14.5)
WBC: 5.8 10*3/uL (ref 3.6–11.0)

## 2014-01-27 LAB — COMPREHENSIVE METABOLIC PANEL
ALK PHOS: 79 U/L
Albumin: 4 g/dL (ref 3.4–5.0)
Anion Gap: 6 — ABNORMAL LOW (ref 7–16)
BUN: 11 mg/dL (ref 7–18)
Bilirubin,Total: 0.3 mg/dL (ref 0.2–1.0)
CO2: 28 mmol/L (ref 21–32)
Calcium, Total: 9 mg/dL (ref 8.5–10.1)
Chloride: 106 mmol/L (ref 98–107)
Creatinine: 0.69 mg/dL (ref 0.60–1.30)
EGFR (African American): >60
Glucose: 92 mg/dL (ref 65–99)
OSMOLALITY: 278 (ref 275–301)
Potassium: 3.7 mmol/L (ref 3.5–5.1)
SGOT(AST): 67 U/L — ABNORMAL HIGH (ref 15–37)
SGPT (ALT): 93 U/L — ABNORMAL HIGH
SODIUM: 140 mmol/L (ref 136–145)
TOTAL PROTEIN: 7.9 g/dL (ref 6.4–8.2)

## 2014-01-27 LAB — URINALYSIS, COMPLETE
BLOOD: NEGATIVE
Bilirubin,UR: NEGATIVE
GLUCOSE, UR: NEGATIVE mg/dL (ref 0–75)
KETONE: NEGATIVE
Leukocyte Esterase: NEGATIVE
Nitrite: NEGATIVE
Ph: 9 (ref 4.5–8.0)
Protein: NEGATIVE
RBC,UR: 2 /HPF (ref 0–5)
SPECIFIC GRAVITY: 1.011 (ref 1.003–1.030)
Squamous Epithelial: 13
WBC UR: 2 /HPF (ref 0–5)

## 2014-05-06 NOTE — Consult Note (Signed)
PATIENT NAME:  Amber [name], Amber [name] MR#:  [id number] DATE OF BIRTH:  October 11, 1990  DATE OF CONSULTATION:  06/28/2013  CONSULTING PHYSICIAN:  Audery Amel, MD  IDENTIFYING INFORMATION AND REASON FOR CONSULTATION: A 24 year old woman with a known history of substance abuse was sent to the hospital under involuntary commitment from her family reporting that she has been psychotic basically. The patient today refused to engage in any kind of conversation with me whatsoever.   HISTORY OF PRESENT ILLNESS: Information on the commitment paperwork that accompanied the patient stated that the patient had been using LSD, mushrooms, methamphetamines, unknown other drugs and that she had torn up her bedroom or her mother's bedroom and made comments about stabbing herself. Also reported that she was delusional that there were "shadow people" living under the house and was behaving in a psychotic and agitated manner. The patient refused to cooperate with the evaluation by our intake nurse and that was over 24 hours ago.   PAST PSYCHIATRIC HISTORY: This same patient was here in the Emergency Room on 06/03 and at that time was more lucid and was asking for substance abuse treatment related to Adderall abuse. She was not under IVC I do not think at that point and ultimately she was discharged apparently with the intention that she go back to stay with her family again and does not seem to have followed up with any treatment. We do not have any other details about treatment or psychiatric problems at this time.   SOCIAL HISTORY: A 24 year old woman, apparently she lives at least part-time with her grandparents. Other details unknown.   FAMILY HISTORY: Unknown.   PAST MEDICAL HISTORY: She has a history of having had an infection to a finger that was treated here by surgery back in 2013. No other known medical problems.   CURRENT MEDICATIONS: None known.   ALLERGIES: No known drug allergies.   REVIEW OF SYSTEMS:  Currently, the patient refuses to cooperate in giving me any history or any conversation whatsoever.   MENTAL STATUS EXAMINATION: Disheveled woman looks her stated age. She is lying down with the covers pulled up to her face in her hospital room in the dark. When I came in and introduced myself she made some movements that indicated that she heard me but would not open her eyes to talk with me. When I asked her if she could wake up and speak with me for a few minute, she shook her head no. I told her that I was the physician and was here to get some history but she still refused to sit up. Did not participate at all. I then told her that the current treatment plan involved likely transfer to the alcohol and drug abuse treatment center tomorrow, at which point she made her only comment which is "I'm not going to no center."   LABORATORY RESULTS: CBC normal. Chemistry panel: Elevated ALT and AST slightly. Nothing else abnormal. Alcohol level negative. Acetaminophen and salicylates normal. Drug screen only positive for cannabis. Urinalysis unremarkable.   VITAL SIGNS: Blood pressure 101/65, respirations 17, pulse 79, temperature 98.3.   ASSESSMENT: A 24 year old woman who has had several visits over the last few years to our hospital Emergency Room seeking detox and treatment, but has not apparently as far as we know, gone for any substance abuse treatment. It sounds like her behavior at home has escalated and become psychotic and agitated. At this point, she appears to be out of control and needs inpatient  treatment even if she is not having medical symptoms of detox.   TREATMENT PLAN: She has been referred to the alcohol and drug abuse treatment center and a bed will be available tomorrow.  Plan is for transfer to [facility] tomorrow under involuntary commitment. P.r.n. medication can be provided tonight as needed.   DIAGNOSIS, PRINCIPAL AND PRIMARY:  AXIS I: Polysubstance dependence.   SECONDARY  DIAGNOSES: AXIS I:   Substance-induced mood disorder.  AXIS II:  Deferred.  AXIS III: No diagnosis.  AXIS IV: Moderate from lack of resources.  AXIS V:  Functioning at time of evaluation 30.   ____________________________ Audery AmelJohn T. Clapacs, MD jtc:ce D: 06/28/2013 17:26:34 ET T: 06/28/2013 18:02:48 ET JOB#: [id number]  cc: Audery AmelJohn T. Clapacs, MD, <Dictator> Audery AmelJOHN T CLAPACS MD ELECTRONICALLY SIGNED 06/29/2013 13:24

## 2014-08-09 ENCOUNTER — Encounter: Payer: Self-pay | Admitting: *Deleted

## 2014-08-09 ENCOUNTER — Emergency Department
Admission: EM | Admit: 2014-08-09 | Discharge: 2014-08-09 | Disposition: A | Discharge status: Home / Self Care | Payer: Managed Care, Other (non HMO) | Attending: Student | Admitting: Student

## 2014-08-09 DIAGNOSIS — K088 Other specified disorders of teeth and supporting structures: Secondary | ICD-10-CM | POA: Diagnosis present

## 2014-08-09 DIAGNOSIS — Z720 Tobacco use: Secondary | ICD-10-CM | POA: Insufficient documentation

## 2014-08-09 DIAGNOSIS — K0889 Other specified disorders of teeth and supporting structures: Secondary | ICD-10-CM

## 2014-08-09 MED ORDER — ACETAMINOPHEN-CODEINE #3 300-30 MG PO TABS: 2.0000 | ORAL_TABLET | ORAL | Status: DC | PRN

## 2014-08-09 NOTE — ED Notes (Signed)
Pt had tooth extracted this afternoon, upper back molar. Pt states the injection has worn off, pt was given rx for ibuprofen and it is not working.

## 2014-08-09 NOTE — Discharge Instructions (Signed)

## 2014-08-09 NOTE — ED Notes (Signed)
Pt states she had right upper tooth pulled today.  Taking motrin without relief.

## 2014-08-09 NOTE — ED Provider Notes (Signed)
[facility] Emergency Department Provider Note  ____________________________________________  Time seen: Approximately 5:21 PM  I have reviewed the triage vital signs and the nursing notes.   HISTORY  Chief Complaint Dental Pain    HPI Amber [name] is a 24 y.o. female presents for evaluation of dental pain. Patient states that she was at the dentist earlier today and had a tooth extraction given a prescription for ibuprofen 600 no relief. Patient states that she needs to go to work tomorrow needs something to help with the pain.   Past Medical History  Diagnosis Date  . UTI (lower urinary tract infection)   . MRSA (methicillin resistant staph aureus) culture positive   . Ovarian cyst   . Kidney stones     There are no active problems to display for this patient.   Past Surgical History  Procedure Laterality Date  . Finger surgery      Current Outpatient Rx  Name  Route  Sig  Dispense  Refill  . acetaminophen-codeine (TYLENOL #3) 300-30 MG per tablet   Oral   Take 2 tablets by mouth every 4 (four) hours as needed for moderate pain.   20 tablet   0     Allergies Review of patient's allergies indicates no known allergies.  No family history on file.  Social History History  Substance Use Topics  . Smoking status: Current Every Day Smoker    Types: Cigarettes  . Smokeless tobacco: Never Used  . Alcohol Use: No    Review of Systems Constitutional: No fever/chills Eyes: No visual changes. ENT: Positive dental pain Cardiovascular: Denies chest pain. Respiratory: Denies shortness of breath. Gastrointestinal: No abdominal pain.  No nausea, no vomiting.  No diarrhea.  No constipation. Genitourinary: Negative for dysuria. Musculoskeletal: Negative for back pain. Skin: Negative for rash. Neurological: Negative for headaches, focal weakness or numbness.  10-point ROS otherwise  negative.  ____________________________________________   PHYSICAL EXAM:  VITAL SIGNS: ED Triage Vitals  Enc Vitals Group     BP 08/09/14 1655 140/69 mmHg     Pulse Rate 08/09/14 1655 65     Resp --      Temp 08/09/14 1655 98.3 F (36.8 C)     Temp Source 08/09/14 1655 Oral     SpO2 08/09/14 1655 100 %     Weight 08/09/14 1655 105 lb (47.628 kg)     Height 08/09/14 1655  (1.549 m)     Head Cir --      Peak Flow --      Pain Score 08/09/14 1700 7     Pain Loc --      Pain Edu? --      Excl. in GC? --     Constitutional: Alert and oriented. Well appearing and in no acute distress. Eyes: Conjunctivae are normal. PERRL. EOMI. Head: Atraumatic. Nose: No congestion/rhinnorhea. Mouth/Throat: Mucous membranes are moist.  Oropharynx non-erythematous. Missing tooth on the upper right molar Neck: No stridor.   Cardiovascular: Normal rate, regular rhythm. Grossly normal heart sounds.  Good peripheral circulation. Respiratory: Normal respiratory effort.  No retractions. Lungs CTAB. Musculoskeletal: No lower extremity tenderness nor edema.  No joint effusions. Neurologic:  Normal speech and language. No gross focal neurologic deficits are appreciated. No gait instability. Skin:  Skin is warm, dry and intact. No rash noted. Psychiatric: Mood and affect are normal. Speech and behavior are normal.  ____________________________________________   LABS (all labs ordered are listed, but only abnormal results  are displayed)  Labs Reviewed - No data to display    PROCEDURES  Procedure(s) performed: None  Critical Care performed: No  ____________________________________________   INITIAL IMPRESSION / ASSESSMENT AND PLAN / ED COURSE  Pertinent labs & imaging results that were available during my care of the patient were reviewed by me and considered in my medical decision making (see chart for details).  Dental extraction prior to arrival. Rx given for Tylenol 3 as needed  for pain. Patient was reviewed on the [place] controlled substance registry. Patient voices no other emergency medical complaints at this time and will return to the ER with any worsening symptomology. ____________________________________________   FINAL CLINICAL IMPRESSION(S) / ED DIAGNOSES  Final diagnoses:  Pain, dental      Evangeline Dakin, PA-C 08/09/14 1727  Gayla Doss, MD 08/09/14 2241

## 2014-09-07 ENCOUNTER — Encounter: Payer: Self-pay | Admitting: Emergency Medicine

## 2014-09-07 ENCOUNTER — Emergency Department
Admission: EM | Admit: 2014-09-07 | Discharge: 2014-09-07 | Disposition: A | Discharge status: Home / Self Care | Payer: Managed Care, Other (non HMO) | Attending: Emergency Medicine | Admitting: Emergency Medicine

## 2014-09-07 ENCOUNTER — Emergency Department: Payer: Managed Care, Other (non HMO)

## 2014-09-07 DIAGNOSIS — X58XXXA Exposure to other specified factors, initial encounter: Secondary | ICD-10-CM | POA: Insufficient documentation

## 2014-09-07 DIAGNOSIS — Y998 Other external cause status: Secondary | ICD-10-CM | POA: Insufficient documentation

## 2014-09-07 DIAGNOSIS — S29011A Strain of muscle and tendon of front wall of thorax, initial encounter: Secondary | ICD-10-CM

## 2014-09-07 DIAGNOSIS — Y9389 Activity, other specified: Secondary | ICD-10-CM | POA: Insufficient documentation

## 2014-09-07 DIAGNOSIS — Z720 Tobacco use: Secondary | ICD-10-CM | POA: Insufficient documentation

## 2014-09-07 DIAGNOSIS — S29012A Strain of muscle and tendon of back wall of thorax, initial encounter: Secondary | ICD-10-CM | POA: Insufficient documentation

## 2014-09-07 DIAGNOSIS — Y9289 Other specified places as the place of occurrence of the external cause: Secondary | ICD-10-CM | POA: Insufficient documentation

## 2014-09-07 IMAGING — CR DG RIBS W/ CHEST 3+V*L*
1 series · 3 of 3 positions shown · non-contrast
Comparison: [DATE] and prior radiographs

CLINICAL DATA: Acute chest and left back/rib pain. Initial
encounter.

EXAM:
LEFT RIBS AND CHEST - 3+ VIEW

[Series 1: w chest pa · 0.14mm/px · 3 of 3 slices shown]
[im 1/3]
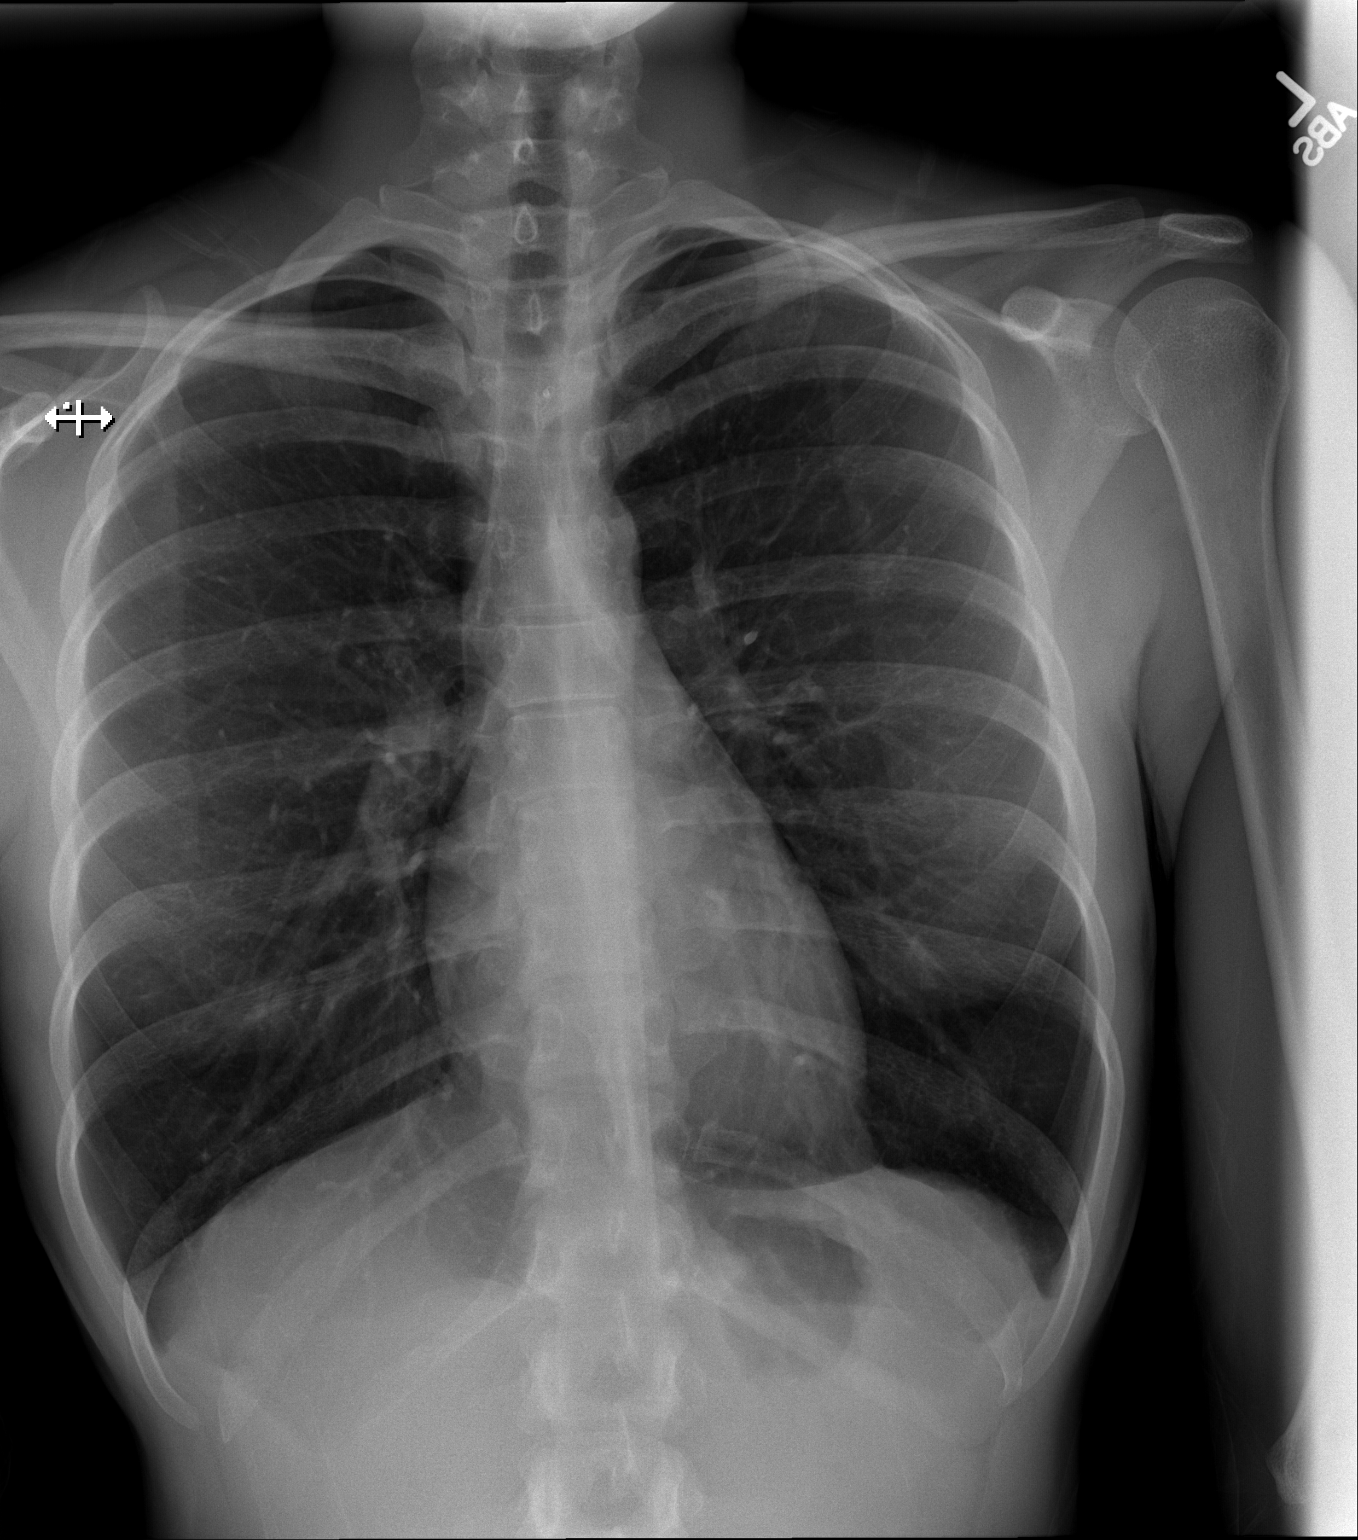
[im 2/3]
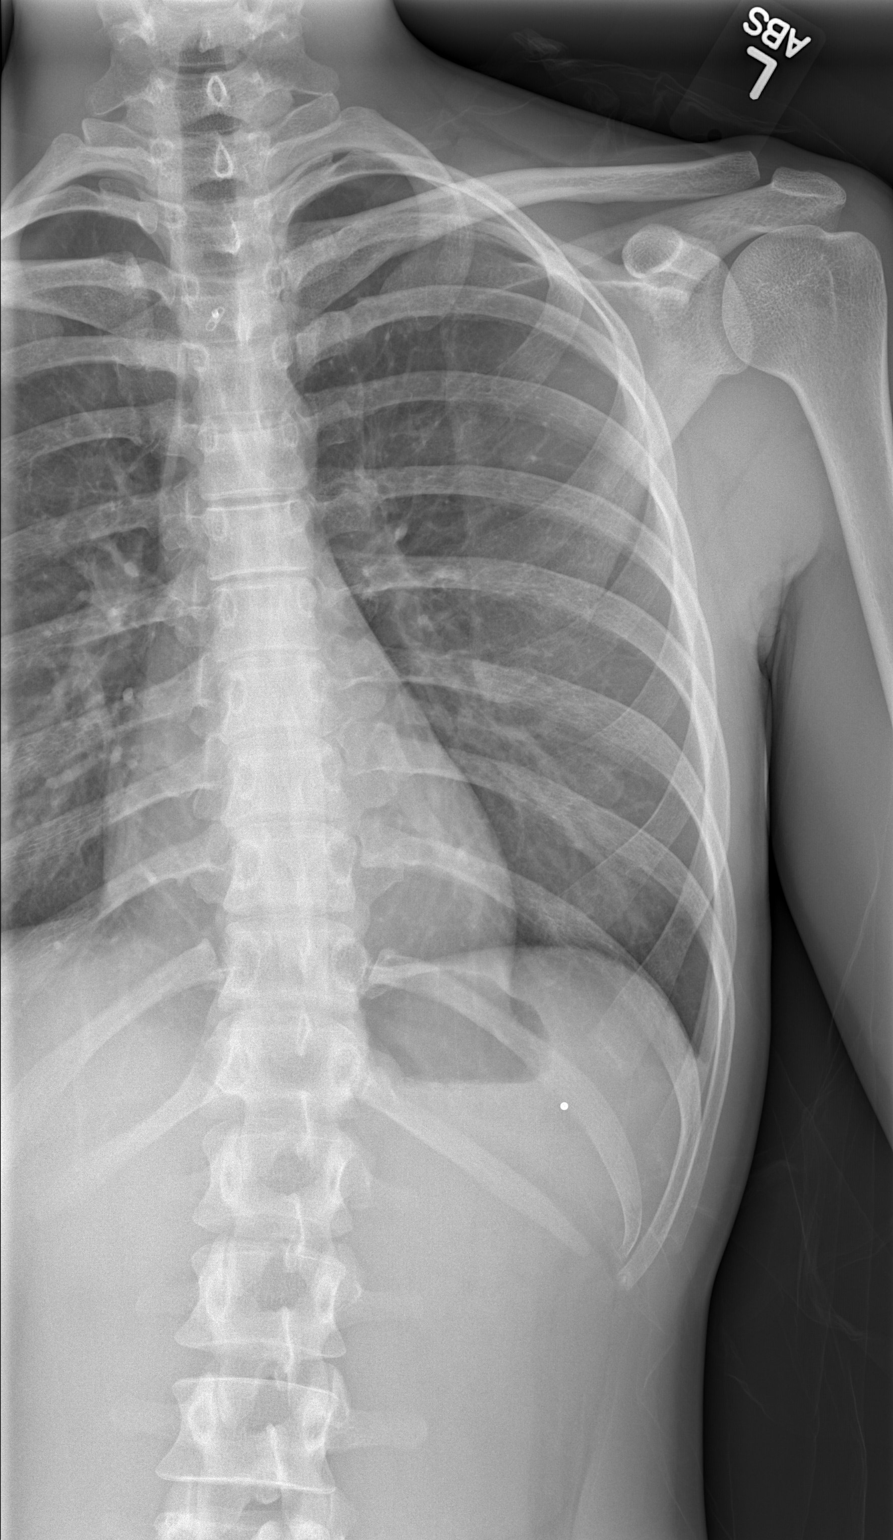
[im 3/3]
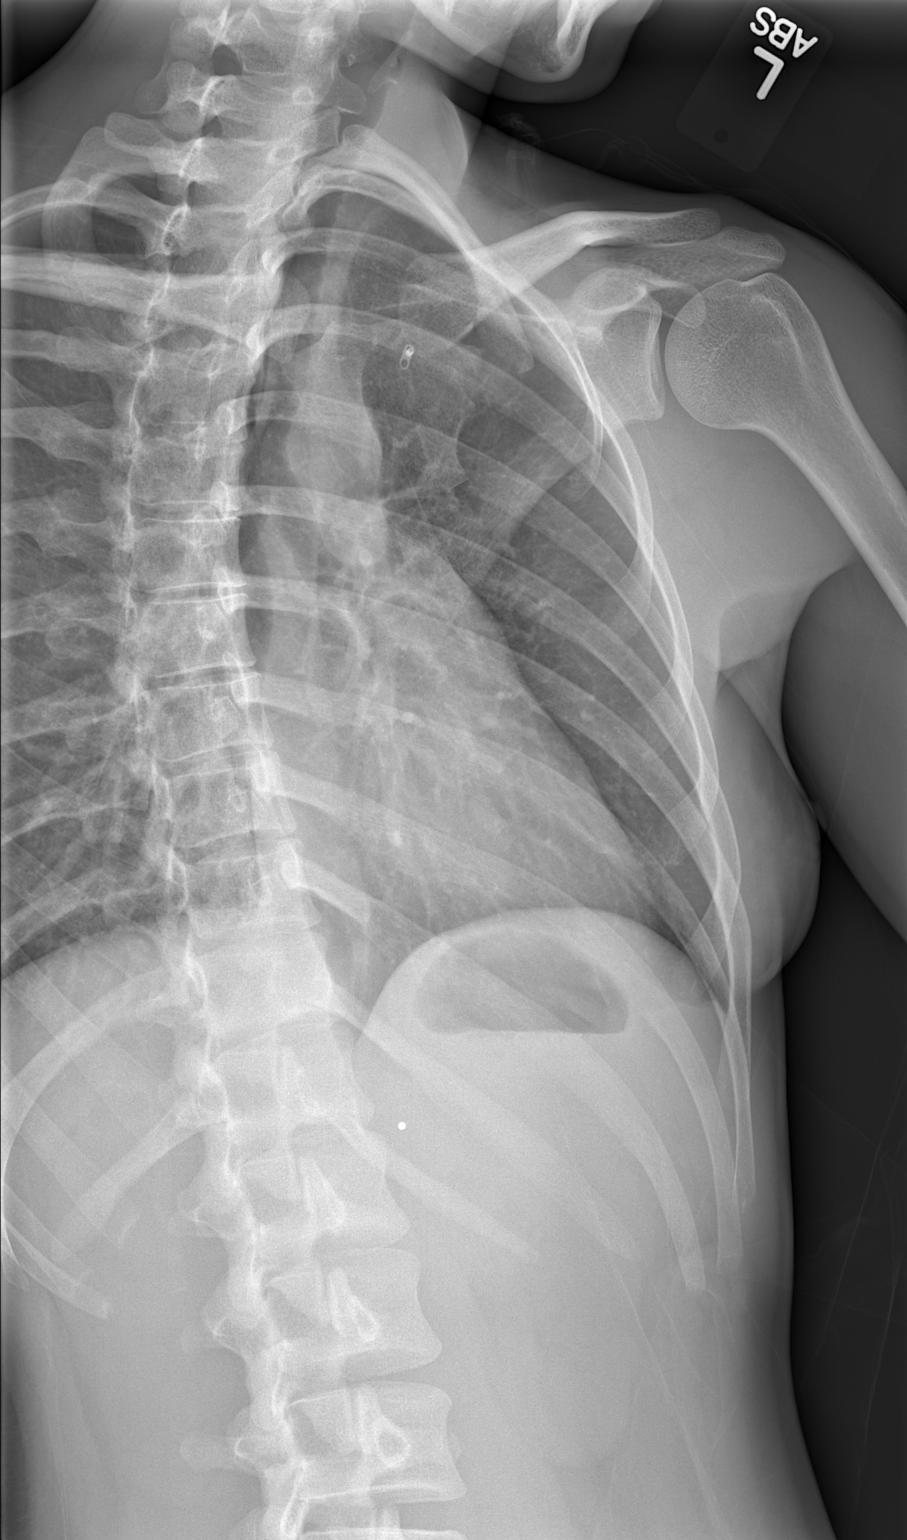

[3 of 3 positions shown; findings below may reference images not displayed]

FINDINGS: The cardiomediastinal silhouette is unremarkable.

There is no evidence of focal airspace disease, pulmonary edema,
suspicious pulmonary nodule/mass, pleural effusion, or pneumothorax.
No acute bony abnormalities are identified.

No rib abnormalities are identified.

A tiny metallic structure along the upper anterior chest wall is
unchanged.
IMPRESSION: No evidence of acute abnormality or active cardiopulmonary disease.

## 2014-09-07 MED ORDER — ETODOLAC 500 MG PO TABS: 500.0000 mg | ORAL_TABLET | Freq: Two times a day (BID) | ORAL | Status: DC

## 2014-09-07 NOTE — ED Notes (Signed)
Pt states she was driving to work, and felt a pain in her upper back, states she thinks she slept wrong.

## 2014-09-07 NOTE — Discharge Instructions (Signed)
Cryotherapy Cryotherapy is when you put ice on your injury. Ice helps lessen pain and puffiness (swelling) after an injury. Ice works the best when you start using it in the first 24 to 48 hours after an injury. HOME CARE  Put a dry or damp towel between the ice pack and your skin.  You may press gently on the ice pack.  Leave the ice on for no more than 10 to 20 minutes at a time.  Check your skin after 5 minutes to make sure your skin is okay.  Rest at least 20 minutes between ice pack uses.  Stop using ice when your skin loses feeling (numbness).  Do not use ice on someone who cannot tell you when it hurts. This includes small children and people with memory problems (dementia). GET HELP RIGHT AWAY IF:  You have white spots on your skin.  Your skin turns blue or pale.  Your skin feels waxy or hard.  Your puffiness gets worse. MAKE SURE YOU:   Understand these instructions.  Will watch your condition.  Will get help right away if you are not doing well or get worse. Document Released: 06/18/2007 Document Revised: 03/24/2011 Document Reviewed: 08/22/2010 Exit[employer] Patient Information 2015 [employer]. This information is not intended to replace advice given to you by your health care provider. Make sure you discuss any questions you have with your health care provider.  

## 2014-09-07 NOTE — ED Provider Notes (Signed)
[facility] Emergency Department Provider Note  ____________________________________________  Time seen: Approximately 9:03 AM  I have reviewed the triage vital signs and the nursing notes.   HISTORY  Chief Complaint Back Pain   HPI Amber [name] is a 24 y.o. female is here with complaint of upper back pain. She states that she was driving to work, stopped it she eats to get a snack, and suddenly felt pain in herleft upper back. She drove immediately to the emergency room for evaluation of this. Denies any injury to this area. She has never had any problems with her chest wall. She denies any heavy lifting or pulling in the last week or so. She has not taken any over-the-counter medication. She denies any difficulty breathing. Currently her pain is 7 out of 10.   Past Medical History  Diagnosis Date  . UTI (lower urinary tract infection)   . MRSA (methicillin resistant staph aureus) culture positive   . Ovarian cyst   . Kidney stones     There are no active problems to display for this patient.   Past Surgical History  Procedure Laterality Date  . Finger surgery      Current Outpatient Rx  Name  Route  Sig  Dispense  Refill  . acetaminophen-codeine (TYLENOL #3) 300-30 MG per tablet   Oral   Take 2 tablets by mouth every 4 (four) hours as needed for moderate pain.   20 tablet   0   . etodolac (LODINE) 500 MG tablet   Oral   Take 1 tablet (500 mg total) by mouth 2 (two) times daily.   20 tablet   3     Allergies Review of patient's allergies indicates no known allergies.  No family history on file.  Social History Social History  Substance Use Topics  . Smoking status: Current Every Day Smoker    Types: Cigarettes  . Smokeless tobacco: Never Used  . Alcohol Use: No    Review of Systems Constitutional: No fever/chills Eyes: No visual changes. ENT: No sore throat. Cardiovascular: Positive chest wall pain Respiratory: Denies  shortness of breath. Gastrointestinal: No abdominal pain.  No nausea, no vomiting.  Genitourinary: Negative for dysuria. Musculoskeletal: Negative for back pain. Skin: Negative for rash. Neurological: Negative for headaches, focal weakness or numbness.  10-point ROS otherwise negative.  ____________________________________________   PHYSICAL EXAM:  VITAL SIGNS: ED Triage Vitals  Enc Vitals Group     BP 09/07/14 0856 110/61 mmHg     Pulse Rate 09/07/14 0856 71     Resp 09/07/14 0856 18     Temp 09/07/14 0856 97.7 F (36.5 C)     Temp Source 09/07/14 0856 Oral     SpO2 09/07/14 0856 99 %     Weight 09/07/14 0856 110 lb (49.896 kg)     Height 09/07/14 0856 5\' 1"  (1.549 m)     Head Cir --      Peak Flow --      Pain Score 09/07/14 0857 7     Pain Loc --      Pain Edu? --      Excl. in GC? --     Constitutional: Alert and oriented. Well appearing and in no acute distress. Eyes: Conjunctivae are normal. PERRL. EOMI. Head: Atraumatic. Nose: No congestion/rhinnorhea. Neck: No stridor.  Nontender cervical spine. Cardiovascular: Normal rate, regular rhythm. Grossly normal heart sounds.  Good peripheral circulation. Respiratory: Normal respiratory effort.  No retractions. Lungs CTAB. Left  lateral ribs approximately 7, 8 and 9 are tender to palpation. There is no edema present there is no gross deformity. There is no ecchymosis or abrasions in the area. Range of motion increases pain. Gastrointestinal: Soft and nontender. No distention. No abdominal bruits. No CVA tenderness. Musculoskeletal: No lower extremity tenderness nor edema.  No joint effusions. Neurologic:  Normal speech and language. No gross focal neurologic deficits are appreciated. No gait instability. Skin:  Skin is warm, dry and intact. No rash noted. Psychiatric: Mood and affect are normal. Speech and behavior are normal.  ____________________________________________   LABS (all labs ordered are listed, but only  abnormal results are displayed)  Labs Reviewed - No data to display  RADIOLOGY  Left ribs and chest shows no acute abnormality per radiologist. Beaulah Corin, personally viewed and evaluated these images (plain radiographs) as part of my medical decision making.  ____________________________________________   PROCEDURES  Procedure(s) performed: None  Critical Care performed: No  ____________________________________________   INITIAL IMPRESSION / ASSESSMENT AND PLAN / ED COURSE  Pertinent labs & imaging results that were available during my care of the patient were reviewed by me and considered in my medical decision making (see chart for details)   She was started on etodolac twice a day with food. She may use ice on the area as needed for pain. She is return to the emergency room for any severe worsening of her symptoms.   ____________________________________________   FINAL CLINICAL IMPRESSION(S) / ED DIAGNOSES  Final diagnoses:  Muscle strain of chest wall, initial encounter      Tommi Rumps, PA-C 09/07/14 1303  Jene Every, MD 09/07/14 [phone]

## 2014-11-08 ENCOUNTER — Inpatient Hospital Stay: Admit: 2014-11-08 | Discharge: 2014-11-08 | Disposition: A | Discharge status: Home / Self Care

## 2014-11-08 DIAGNOSIS — L03011 Cellulitis of right finger: Secondary | ICD-10-CM

## 2014-11-08 LAB — HCG URINE, QUALITATIVE: Preg Test, Ur: NEGATIVE

## 2014-11-08 MED ORDER — bupivacaine (PF)(SENSORCAINE/MARCAINE) 0.5% injection
0.5 | Freq: Once | INTRAMUSCULAR | Status: AC
Start: 2014-11-08 — End: 2014-11-08
  Administered 2014-11-08: 18:00:00 25 mL

## 2014-11-08 MED ORDER — cephALEXin (KEFLEX) capsule 1,000 mg
500 | Freq: Once | ORAL | Status: AC
Start: 2014-11-08 — End: 2014-11-08
  Administered 2014-11-08: 18:00:00 1000 mg via ORAL

## 2014-11-08 MED ORDER — naproxen (NAPROSYN) 500 MG tablet
500 | ORAL_TABLET | Freq: Two times a day (BID) | ORAL | Status: AC | PRN
Start: 2014-11-08 — End: ?

## 2014-11-08 MED ORDER — sulfamethoxazole-trimethoprim (BACTRIM DS) 800-160 mg per tablet
800-160 | ORAL_TABLET | Freq: Two times a day (BID) | ORAL | Status: AC
Start: 2014-11-08 — End: 2014-11-15

## 2014-11-08 MED ORDER — cephALEXin (KEFLEX) 500 MG capsule
500 | ORAL_CAPSULE | Freq: Two times a day (BID) | ORAL | Status: AC
Start: 2014-11-08 — End: ?

## 2014-11-08 MED FILL — CEPHALEXIN 500 MG CAPSULE: 500 500 MG | ORAL | Qty: 2

## 2014-11-08 MED FILL — BUPIVACAINE (PF) 0.5 % (5 MG/ML) INJECTION SOLUTION: 0.5 0.5 % (5 mg/mL) | INTRAMUSCULAR | Qty: 30

## 2014-11-08 NOTE — Unmapped (Signed)
ED Attending Attestation Note    Date of service:  11/08/2014    This patient was seen by the advanced practice provider.  I have seen and examined the patient, agree with the workup, evaluation, management and diagnosis.  The care plan has been discussed and I concur.      My assessment reveals a 24 y.o. female with R MF pain and swelling.  She bites her nails.  On exam has a paronychia of her R MF w/o felon formation or proximal cellulitis. Marland Kitchen

## 2014-11-08 NOTE — Unmapped (Signed)
i like to chew on my fingers and now the right middle one is red and pain full

## 2014-11-08 NOTE — Unmapped (Signed)
[facility] ED Note    Date of service:  11/08/2014    Reason for Visit: Finger Swelling      Patient History     HPI  Typically healthy 24 year old female presents today with an infection of her right middle finger.  She is right-hand dominant and a nail biter.  She says that she's had increasing pain and swelling to the fingertip for the last 2 days.  No fevers or chills.  She is unsure if she may be pregnant.     Past Medical History   Diagnosis Date   ??? MRSA (methicillin resistant staph aureus) culture positive        History reviewed. No pertinent past surgical history.    Patient  reports that she has been smoking Cigarettes.  She has a 5 pack-year smoking history. She does not have any smokeless tobacco history on file. 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 She reports that she does not drink alcohol.      Previous Medications    No medications on file       Allergies:   Allergies as of 11/08/2014   ??? (No Known Allergies)       Review of Systems     Review of Systems    Pertinent review of systems as above or is otherwise negative    Physical Exam     ED Triage Vitals   Vital Signs Group      Temp 11/08/14 1319 98 ??F (36.7 ??C)      Temp Source 11/08/14 1319 Oral      Heart Rate 11/08/14 1319 76      Heart Rate Source 11/08/14 1319 Automatic      Resp 11/08/14 1319 18      SpO2 11/08/14 1319 99 %      BP 11/08/14 1319 134/71 mmHg      BP Location 11/08/14 1319 Right arm      BP Method 11/08/14 1319 Automatic      Patient Position 11/08/14 1319 Sitting   SpO2 11/08/14 1319 99 %   O2 Device 11/08/14 1319 None (Room air)       Physical Exam   Constitutional: Vital signs are normal. She appears well-developed and well-nourished. She is cooperative.  Non-toxic appearance.   HENT:   Head: Normocephalic and atraumatic.   Right Ear: External ear normal.   Left Ear: External ear normal.   Nose: Nose normal.   Eyes: Conjunctivae  are normal.   Neck: Normal range of motion. Neck supple.   Cardiovascular: Intact distal pulses.    Pulmonary/Chest: Effort normal. No respiratory distress.   Musculoskeletal: Normal range of motion.   Neurological: She is alert.  She is oriented to person, place, time and situation.  She is alert. Normal speech without aphasia or dysarthria.  Moves all extremities spontaneosly and symmetrically.  No cranial nerve deficit. Gait is normal without ataxia.    Skin: Skin is warm and dry.   Patient has a paronychia of the right middle finger with edema near the cuticle.  No felon.  She is neurovascularly intact.   Psychiatric: She has a normal mood and affect. Her behavior is normal.         Diagnostic Studies         Emergency Department Procedures     Procedures incision and drainage of a paronychia     Patient received a digital block to the right middle finger  with 3 ML's of 0.5 percent bupivacaine with good anesthetic effect obtained.  The edge of a surgical scissors is used to dissect the cuticle at the base with expression of a moderate amount of purulence.  Adaptic is placed within the wound, the patient's finger is then dressed and a U-shaped aluminum splint is placed over the top for protection.  The patient tolerated this procedure well.  ED Course and MDM     Karen Conrad is a 24 y.o. female who presented to the emergency department with Finger Swelling   patient has a paronychia of her right toe finger which is drained here in the department.  She is started on Bactrim and Keflex for home, as she has history of MRSA infections recurrently.  She is encouraged to stop biting her nails.  The attending has seen this patient and agrees with the assessment and plan    Assessment: Right finger paronychia      Karen Mccoy, PA  11/08/14 1511

## 2014-11-08 NOTE — Unmapped (Signed)
Wash the wound daily with soap and water.  Splint for protection as needed.  Remove the packing gauze in one to 2 days.

## 2015-04-12 ENCOUNTER — Emergency Department
Admission: EM | Admit: 2015-04-12 | Discharge: 2015-04-12 | Disposition: A | Discharge status: Home / Self Care | Payer: Managed Care, Other (non HMO) | Attending: Emergency Medicine | Admitting: Emergency Medicine

## 2015-04-12 ENCOUNTER — Encounter: Payer: Self-pay | Admitting: Emergency Medicine

## 2015-04-12 DIAGNOSIS — K0889 Other specified disorders of teeth and supporting structures: Secondary | ICD-10-CM | POA: Diagnosis not present

## 2015-04-12 DIAGNOSIS — Z87891 Personal history of nicotine dependence: Secondary | ICD-10-CM | POA: Insufficient documentation

## 2015-04-12 DIAGNOSIS — Z791 Long term (current) use of non-steroidal anti-inflammatories (NSAID): Secondary | ICD-10-CM | POA: Insufficient documentation

## 2015-04-12 MED ORDER — LIDOCAINE-EPINEPHRINE 2 %-1:100000 IJ SOLN
1.7000 mL | Freq: Once | INTRAMUSCULAR | Status: DC
  Filled 2015-04-12: qty 1.7

## 2015-04-12 NOTE — Discharge Instructions (Signed)

## 2015-04-12 NOTE — ED Provider Notes (Signed)
[facility] Emergency Department Provider Note  ____________________________________________  Time seen: Approximately 2:51 PM  I have reviewed the triage vital signs and the nursing notes.   HISTORY  Chief Complaint Dental Pain    HPI Amber [name] is a 25 y.o. female who presents emergency department complaining of left lower dental pain. Patient states that she has a tooth with a filling that is giving her significant amount of pain. Patient states that she called her dentist office but they were closed this afternoon. She presents emergency Department looking forpain relief. Patient denies any fevers or chills, swelling, difficulty breathing or speaking.   Past Medical History  Diagnosis Date  . UTI (lower urinary tract infection)   . MRSA (methicillin resistant staph aureus) culture positive   . Ovarian cyst   . Kidney stones     There are no active problems to display for this patient.   Past Surgical History  Procedure Laterality Date  . Finger surgery      Current Outpatient Rx  Name  Route  Sig  Dispense  Refill  . acetaminophen-codeine (TYLENOL #3) 300-30 MG per tablet   Oral   Take 2 tablets by mouth every 4 (four) hours as needed for moderate pain.   20 tablet   0   . etodolac (LODINE) 500 MG tablet   Oral   Take 1 tablet (500 mg total) by mouth 2 (two) times daily.   20 tablet   3     Allergies Review of patient's allergies indicates no known allergies.  No family history on file.  Social History Social History  Substance Use Topics  . Smoking status: Former Smoker    Types: Cigarettes  . Smokeless tobacco: Never Used  . Alcohol Use: No     Review of Systems  Constitutional: No fever/chills ENT: No sore throat. Dental pain Cardiovascular: no chest pain. Respiratory: no cough. No SOB. Skin: Negative for rash. Neurological: Negative for headaches, focal weakness or numbness. 10-point ROS otherwise  negative.  ____________________________________________   PHYSICAL EXAM:  VITAL SIGNS: ED Triage Vitals  Enc Vitals Group     BP 04/12/15 1429 111/70 mmHg     Pulse Rate 04/12/15 1429 92     Resp 04/12/15 1429 16     Temp 04/12/15 1429 97.6 F (36.4 C)     Temp Source 04/12/15 1429 Oral     SpO2 04/12/15 1429 98 %     Weight 04/12/15 1424 110 lb (49.896 kg)     Height 04/12/15 1424  (1.549 m)     Head Cir --      Peak Flow --      Pain Score 04/12/15 1424 8     Pain Loc --      Pain Edu? --      Excl. in GC? --      Constitutional: Alert and oriented. Well appearing and in no acute distress. Eyes: Conjunctivae are normal. PERRL. EOMI. Head: Atraumatic. ENT:      Ears:       Nose: No congestion/rhinnorhea.      Mouth/Throat: Mucous membranes are moist. Dental filling is noted to the second molar right lower dentition. No surrounding erythema or edema. Neck: No stridor.   Hematological/Lymphatic/Immunilogical: No cervical lymphadenopathy. Cardiovascular: Normal rate, regular rhythm. Normal S1 and S2.  Good peripheral circulation. Respiratory: Normal respiratory effort without tachypnea or retractions. Lungs CTAB. Neurologic:  Normal speech and language. No gross focal neurologic deficits are  appreciated.  Skin:  Skin is warm, dry and intact. No rash noted. Psychiatric: Mood and affect are normal. Speech and behavior are normal. Patient exhibits appropriate insight and judgement.   ____________________________________________   LABS (all labs ordered are listed, but only abnormal results are displayed)  Labs Reviewed - No data to display ____________________________________________  EKG   ____________________________________________  RADIOLOGY   No results found.  ____________________________________________    PROCEDURES  Procedure(s) performed:   Dental block is performed using dental carpi jet using 1.7 mL of lidocaine with epi. This is  injected using a 27-gauge needle. This is injected into the mandibular branch of the trigeminal nerve. Patient tolerated procedure well with no complications.    Medications  lidocaine-EPINEPHrine (XYLOCAINE W/EPI) 2 %-1:100000 (with pres) injection 1.7 mL (not administered)     ____________________________________________   INITIAL IMPRESSION / ASSESSMENT AND PLAN / ED COURSE  Pertinent labs & imaging results that were available during my care of the patient were reviewed by me and considered in my medical decision making (see chart for details).  Patient's diagnosis is consistent with dental pain. No signs of infection at this time. Patient is given a dental block here in emergency department for symptom relief..  Patient is to follow up with dentist if symptoms persist past this treatment course. Patient is given ED precautions to return to the ED for any worsening or new symptoms.     ____________________________________________  FINAL CLINICAL IMPRESSION(S) / ED DIAGNOSES  Final diagnoses:  Pain, dental      NEW MEDICATIONS STARTED DURING THIS VISIT:  New Prescriptions   No medications on file        This chart was dictated using voice recognition software/Dragon. Despite best efforts to proofread, errors can occur which can change the meaning. Any change was purely unintentional.    Racheal PatchesJonathan D Cuthriell, PA-C 04/12/15 1530  Jene Everyobert Kinner, MD 04/12/15 [phone]

## 2015-04-12 NOTE — ED Notes (Signed)
Dental pain for a few days now.

## 2015-04-13 ENCOUNTER — Encounter: Payer: Self-pay | Admitting: Emergency Medicine

## 2015-04-13 ENCOUNTER — Emergency Department
Admission: EM | Admit: 2015-04-13 | Discharge: 2015-04-13 | Disposition: A | Discharge status: Home / Self Care | Payer: Managed Care, Other (non HMO) | Attending: Student | Admitting: Student

## 2015-04-13 DIAGNOSIS — K0889 Other specified disorders of teeth and supporting structures: Secondary | ICD-10-CM | POA: Diagnosis not present

## 2015-04-13 DIAGNOSIS — Z87891 Personal history of nicotine dependence: Secondary | ICD-10-CM | POA: Diagnosis not present

## 2015-04-13 MED ORDER — ETODOLAC 400 MG PO TABS: 400.0000 mg | ORAL_TABLET | Freq: Two times a day (BID) | ORAL | Status: DC

## 2015-04-13 MED ORDER — OXYCODONE-ACETAMINOPHEN 5-325 MG PO TABS: 1.0000 | ORAL_TABLET | ORAL | Status: DC | PRN

## 2015-04-13 NOTE — Discharge Instructions (Signed)
Follow-up with scheduled dental appointment. °

## 2015-04-13 NOTE — ED Provider Notes (Signed)
[facility] Emergency Department Provider Note  ____________________________________________  Time seen: Approximately 10:46 AM  I have reviewed the triage vital signs and the nursing notes.   HISTORY  Chief Complaint Dental Pain    HPI Amber [name] is a 25 y.o. female patient for several days of dental pain. Patient was seen here yesterday and given a dental block. Patient contacted a dentist told she cannot be seen until Monday. Patient requested pain medication until dental appointment. Patient rated the pain as a 10 over 10. Patient has been using over-the-counter anti-inflammatories with no success. Patient had a dental block yesterday given moderate relief but the ejection has faded.  Past Medical History  Diagnosis Date  . UTI (lower urinary tract infection)   . MRSA (methicillin resistant staph aureus) culture positive   . Ovarian cyst   . Kidney stones     There are no active problems to display for this patient.   Past Surgical History  Procedure Laterality Date  . Finger surgery      Current Outpatient Rx  Name  Route  Sig  Dispense  Refill  . acetaminophen-codeine (TYLENOL #3) 300-30 MG per tablet   Oral   Take 2 tablets by mouth every 4 (four) hours as needed for moderate pain.   20 tablet   0   . etodolac (LODINE) 400 MG tablet   Oral   Take 1 tablet (400 mg total) by mouth 2 (two) times daily.   30 tablet   0   . etodolac (LODINE) 500 MG tablet   Oral   Take 1 tablet (500 mg total) by mouth 2 (two) times daily.   20 tablet   3   . oxyCODONE-acetaminophen (ROXICET) 5-325 MG tablet   Oral   Take 1 tablet by mouth every 4 (four) hours as needed for severe pain.   30 tablet   0     Allergies Review of patient's allergies indicates no known allergies.  History reviewed. No pertinent family history.  Social History Social History  Substance Use Topics  . Smoking status: Former Smoker    Types: Cigarettes  .  Smokeless tobacco: Never Used  . Alcohol Use: No    Review of Systems Constitutional: No fever/chills Eyes: No visual changes.ENT: No sore throat. Dental pain Cardiovascular: Denies chest pain. Respiratory: Denies shortness of breath. Gastrointestinal: No abdominal pain.  No nausea, no vomiting.  No diarrhea.  No constipation. Genitourinary: Negative for dysuria. Musculoskeletal: Negative for back pain. Skin: Negative for rash. Neurological: Negative for headaches, focal weakness or numbness.   ____________________________________________   PHYSICAL EXAM:  VITAL SIGNS: ED Triage Vitals  Enc Vitals Group     BP 04/13/15 1030 104/68 mmHg     Pulse Rate 04/13/15 1030 74     Resp 04/13/15 1030 16     Temp 04/13/15 1030 97.6 F (36.4 C)     Temp Source 04/13/15 1030 Oral     SpO2 04/13/15 1030 98 %     Weight 04/13/15 1030 110 lb (49.896 kg)     Height 04/13/15 1030 5\' 1"  (1.549 m)     Head Cir --      Peak Flow --      Pain Score 04/13/15 1032 9     Pain Loc --      Pain Edu? --      Excl. in GC? --     Constitutional: Alert and oriented. Well appearing and in no acute distress.  Eyes: Conjunctivae are normal. PERRL. EOMI. Head: Atraumatic. Nose: No congestion/rhinnorhea. Mouth/Throat: Mucous membranes are moist.  Oropharynx non-erythematous. The devitalized right lower molar Neck: No stridor.  No cervical spine tenderness to palpation. Hematological/Lymphatic/Immunilogical: No cervical lymphadenopathy. Cardiovascular: Normal rate, regular rhythm. Grossly normal heart sounds.  Good peripheral circulation. Respiratory: Normal respiratory effort.  No retractions. Lungs CTAB. Gastrointestinal: Soft and nontender. No distention. No abdominal bruits. No CVA tenderness. Musculoskeletal: No lower extremity tenderness nor edema.  No joint effusions. Neurologic:  Normal speech and language. No gross focal neurologic deficits are appreciated. No gait instability. Skin:  Skin is  warm, dry and intact. No rash noted. Psychiatric: Mood and affect are normal. Speech and behavior are normal.  ____________________________________________   LABS (all labs ordered are listed, but only abnormal results are displayed)  Labs Reviewed - No data to display ____________________________________________  EKG   ____________________________________________  RADIOLOGY   ____________________________________________   PROCEDURES  Procedure(s) performed: None  Critical Care performed: No  ____________________________________________   INITIAL IMPRESSION / ASSESSMENT AND PLAN / ED COURSE  Pertinent labs & imaging results that were available during my care of the patient were reviewed by me and considered in my medical decision making (see chart for details).  Dental pain. She gave discharge care instructions. Patient given prescription for Percocets and Lodine. Patient will follow-up with scheduled dental appointment in 3 days. FINAL CLINICAL IMPRESSION(S) / ED DIAGNOSES  Final diagnoses:  Pain, dental      Joni Reining, PA-C 04/13/15 1049  Joni Reining, PA-C 04/13/15 1049  Gayla Doss, MD 04/13/15 [phone]

## 2015-04-13 NOTE — ED Notes (Signed)
Dental pain for several days.  Needs something for pain until dental appointment.

## 2015-04-16 MED ORDER — PROPOFOL 1000 MG/100ML IV EMUL
INTRAVENOUS | Status: AC
  Filled 2015-04-16: qty 100

## 2015-09-29 ENCOUNTER — Emergency Department (HOSPITAL_COMMUNITY)
Admission: EM | Admit: 2015-09-29 | Discharge: 2015-09-30 | Disposition: A | Discharge status: Home / Self Care | Payer: Managed Care, Other (non HMO) | Attending: Emergency Medicine | Admitting: Emergency Medicine

## 2015-09-29 ENCOUNTER — Encounter (HOSPITAL_COMMUNITY): Payer: Self-pay | Admitting: Emergency Medicine

## 2015-09-29 ENCOUNTER — Emergency Department (HOSPITAL_COMMUNITY): Payer: Self-pay

## 2015-09-29 DIAGNOSIS — R45851 Suicidal ideations: Secondary | ICD-10-CM

## 2015-09-29 DIAGNOSIS — Z5181 Encounter for therapeutic drug level monitoring: Secondary | ICD-10-CM | POA: Insufficient documentation

## 2015-09-29 DIAGNOSIS — F329 Major depressive disorder, single episode, unspecified: Secondary | ICD-10-CM | POA: Diagnosis present

## 2015-09-29 DIAGNOSIS — Z87891 Personal history of nicotine dependence: Secondary | ICD-10-CM | POA: Insufficient documentation

## 2015-09-29 DIAGNOSIS — F32A Depression, unspecified: Secondary | ICD-10-CM | POA: Diagnosis present

## 2015-09-29 DIAGNOSIS — F1910 Other psychoactive substance abuse, uncomplicated: Secondary | ICD-10-CM

## 2015-09-29 LAB — RAPID URINE DRUG SCREEN, HOSP PERFORMED
Amphetamines: NOT DETECTED
Barbiturates: NOT DETECTED
Benzodiazepines: NOT DETECTED
COCAINE: POSITIVE — AB
OPIATES: NOT DETECTED
Tetrahydrocannabinol: POSITIVE — AB

## 2015-09-29 LAB — CBC WITH DIFFERENTIAL/PLATELET
BASOS ABS: 0 10*3/uL (ref 0.0–0.1)
Basophils Relative: 0 %
Eosinophils Absolute: 0.2 10*3/uL (ref 0.0–0.7)
Eosinophils Relative: 2 %
HCT: 42.6 % (ref 36.0–46.0)
HEMOGLOBIN: 14.2 g/dL (ref 12.0–15.0)
Lymphocytes Relative: 17 %
Lymphs Abs: 1.6 10*3/uL (ref 0.7–4.0)
MCH: 29.5 pg (ref 26.0–34.0)
MCHC: 33.3 g/dL (ref 30.0–36.0)
MCV: 88.4 fL (ref 78.0–100.0)
MONO ABS: 0.8 10*3/uL (ref 0.1–1.0)
Monocytes Relative: 9 %
NEUTROS PCT: 72 %
Neutro Abs: 6.4 10*3/uL (ref 1.7–7.7)
Platelets: 279 10*3/uL (ref 150–400)
RBC: 4.82 MIL/uL (ref 3.87–5.11)
RDW: 13.9 % (ref 11.5–15.5)
WBC: 9 10*3/uL (ref 4.0–10.5)

## 2015-09-29 LAB — BASIC METABOLIC PANEL
ANION GAP: 8 (ref 5–15)
BUN: 14 mg/dL (ref 6–20)
CALCIUM: 9.8 mg/dL (ref 8.9–10.3)
CHLORIDE: 104 mmol/L (ref 101–111)
CO2: 24 mmol/L (ref 22–32)
Creatinine, Ser: 0.85 mg/dL (ref 0.44–1.00)
GFR calc non Af Amer: >60 mL/min (ref >60)
Glucose, Bld: 105 mg/dL — ABNORMAL HIGH (ref 65–99)
Potassium: 4 mmol/L (ref 3.5–5.1)
SODIUM: 136 mmol/L (ref 135–145)

## 2015-09-29 LAB — ETHANOL

## 2015-09-29 LAB — PREGNANCY, URINE: Preg Test, Ur: NEGATIVE

## 2015-09-29 IMAGING — CR DG HUMERUS 2V *L*
2 series · 2 of 2 positions shown · non-contrast
Comparison: None.

CLINICAL DATA: Needle broke off in arm 1 week ago.

EXAM:
LEFT HUMERUS - 2+ VIEW

[w humerus ap left]
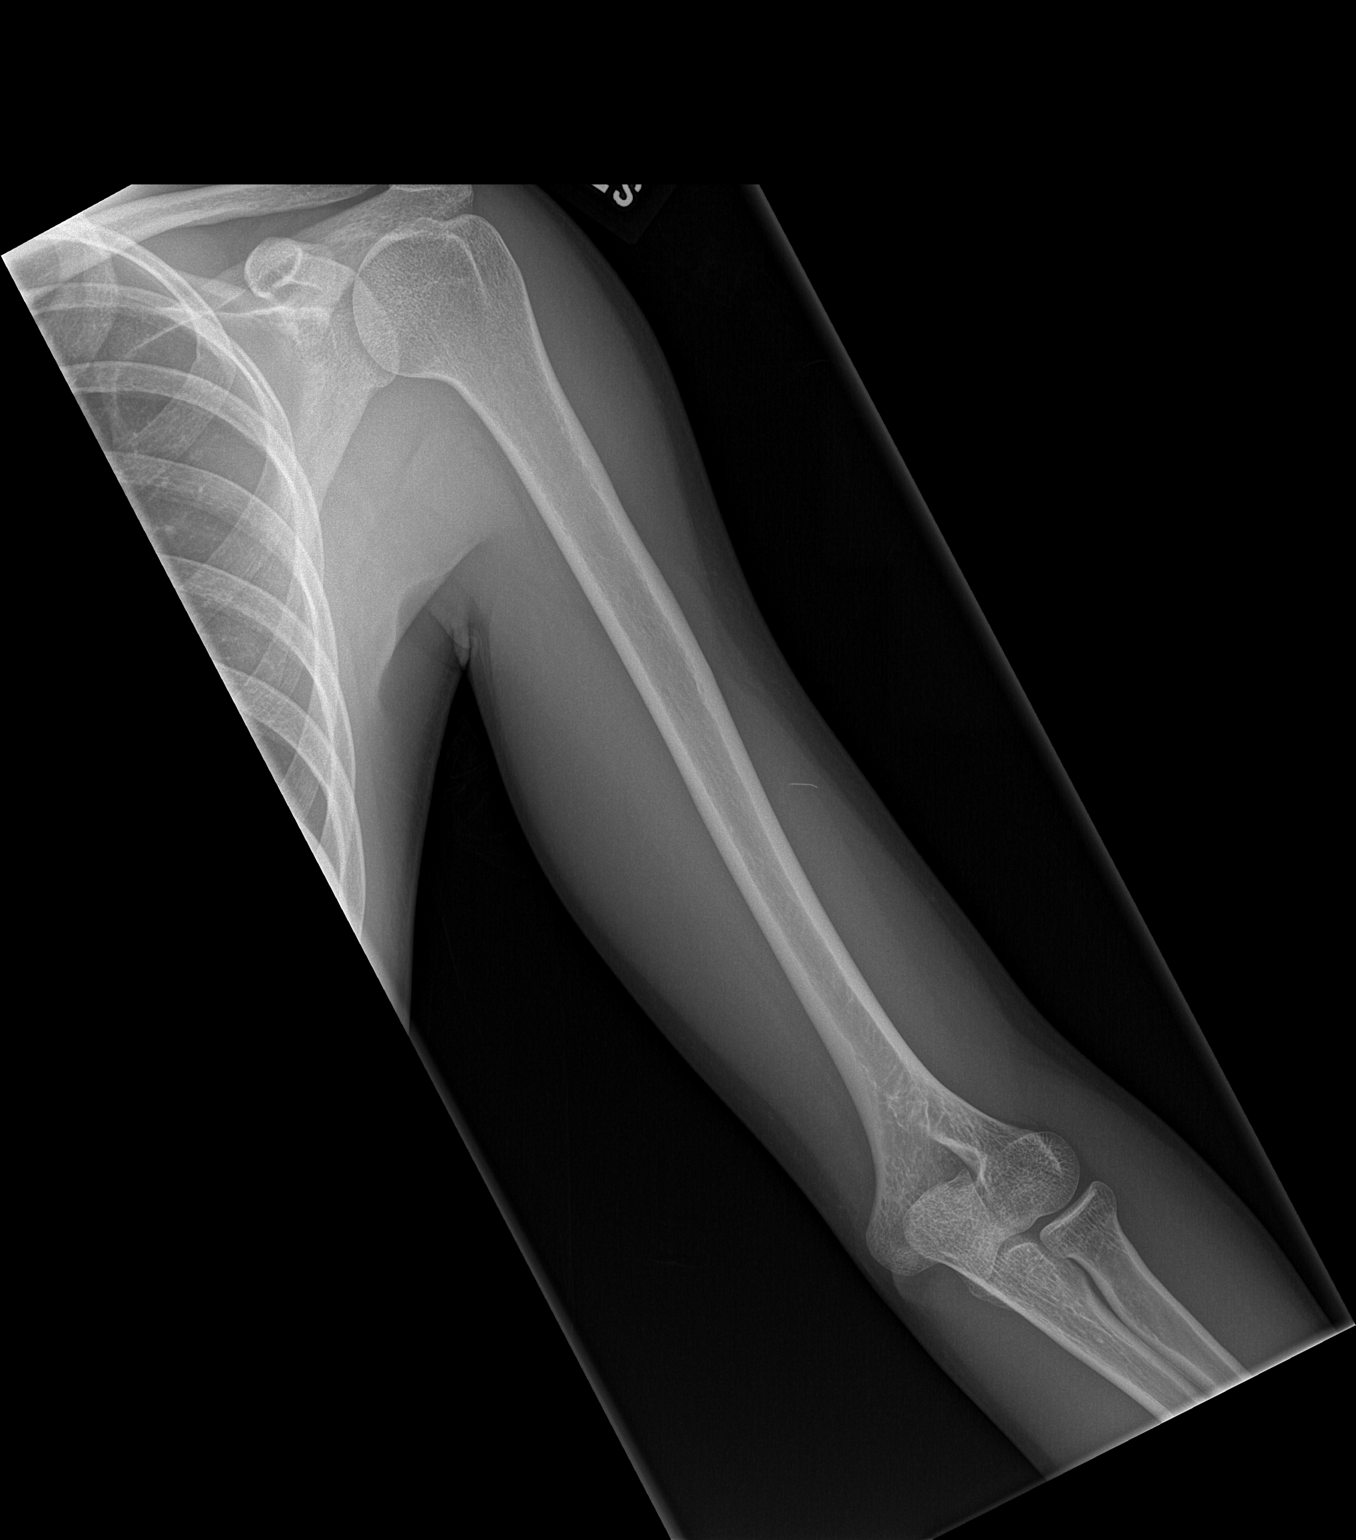

[w humerus lat left]
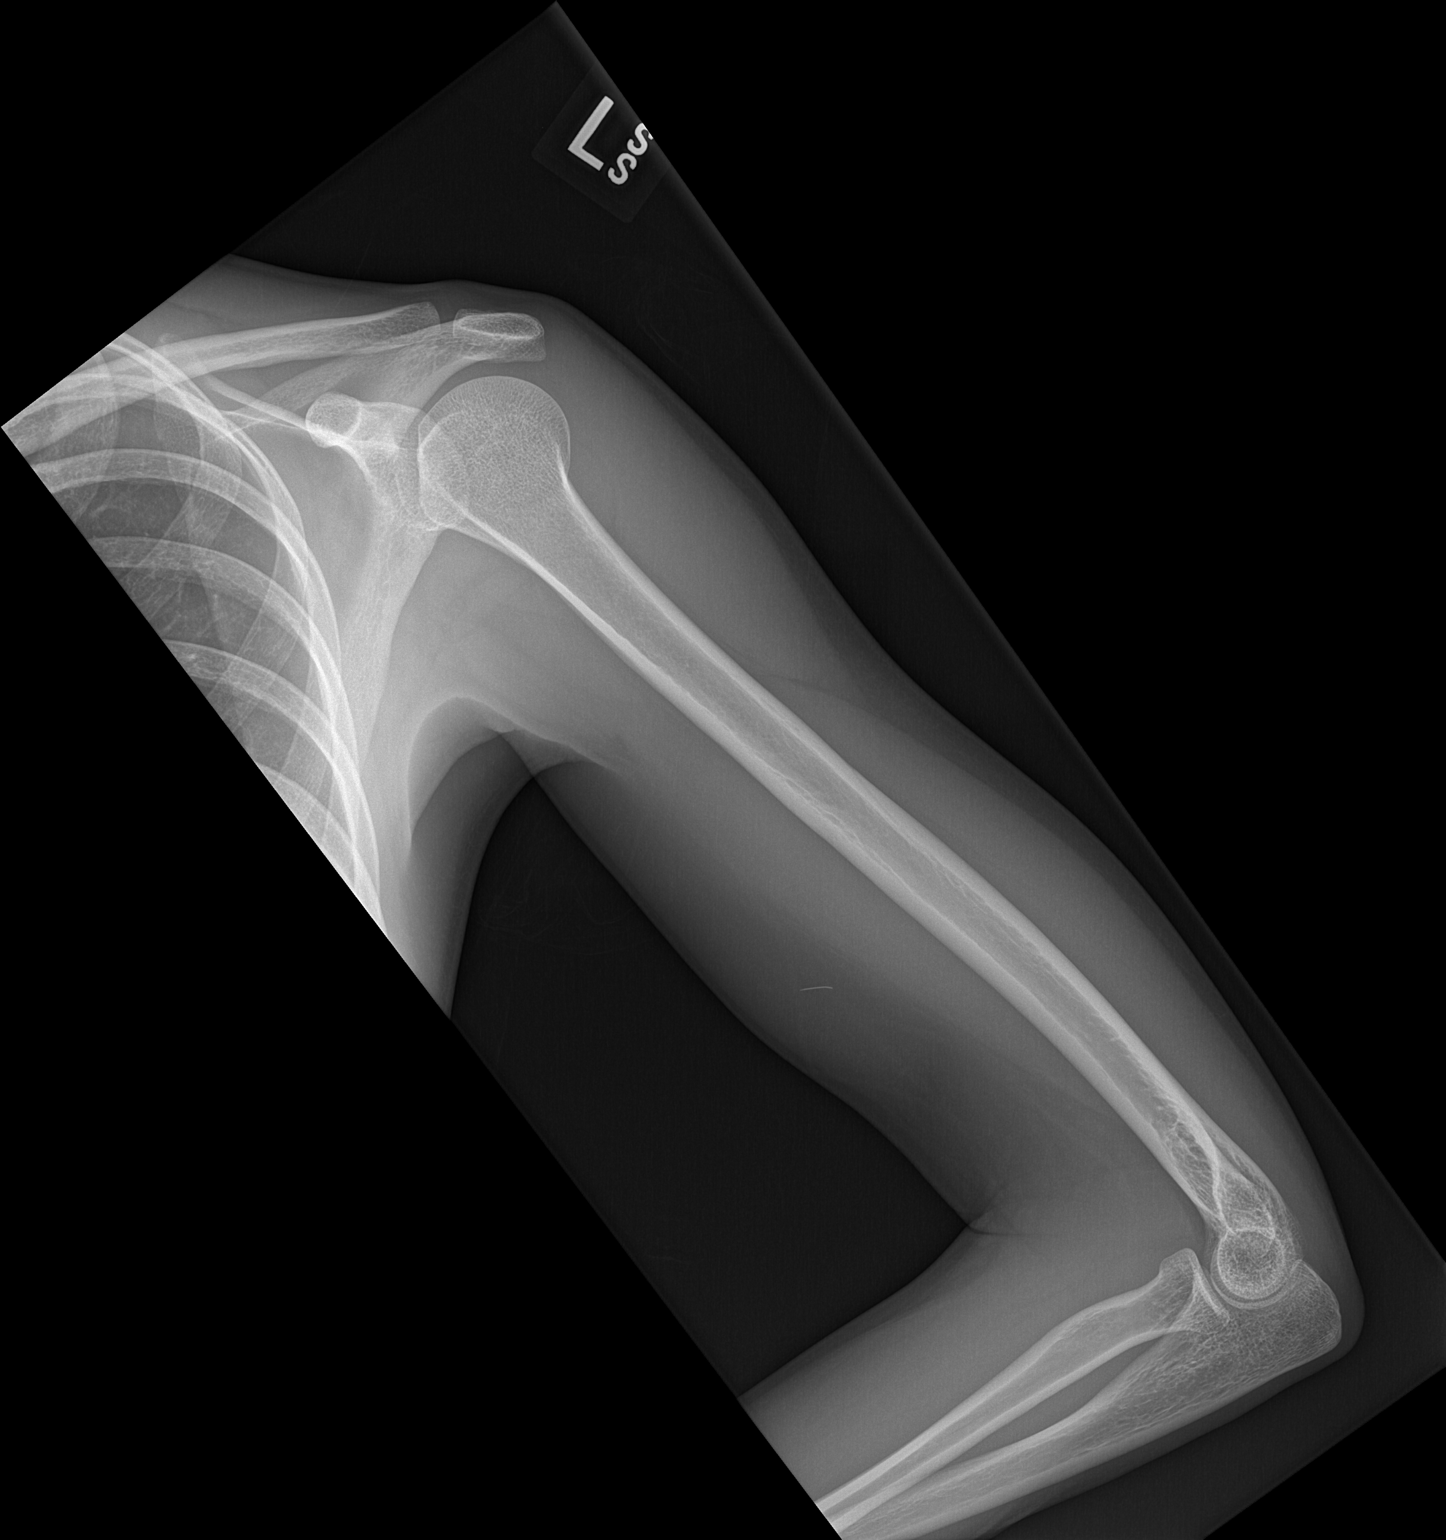

[2 of 2 positions shown; findings below may reference images not displayed]

FINDINGS: A 9 mm metallic linear foreign body is identified within the soft
tissues at the level of the mid humerus. This appears to lie
anterolaterally to the humerus.
IMPRESSION: Metallic foreign body within the soft tissues as described.

## 2015-09-29 MED ORDER — LORAZEPAM 1 MG PO TABS
1.0000 mg | ORAL_TABLET | Freq: Three times a day (TID) | ORAL | Status: DC | PRN
  Administered 2015-09-29: 1 mg via ORAL
  Filled 2015-09-29 (×2): qty 1

## 2015-09-29 MED ORDER — ONDANSETRON HCL 4 MG PO TABS: 4.0000 mg | ORAL_TABLET | Freq: Three times a day (TID) | ORAL | Status: DC | PRN

## 2015-09-29 MED ORDER — NICOTINE 21 MG/24HR TD PT24: 21.0000 mg | MEDICATED_PATCH | Freq: Every day | TRANSDERMAL | Status: DC | PRN

## 2015-09-29 MED ORDER — ALUM & MAG HYDROXIDE-SIMETH 200-200-20 MG/5ML PO SUSP: 30.0000 mL | ORAL | Status: DC | PRN

## 2015-09-29 MED ORDER — ONDANSETRON 8 MG PO TBDP
8.0000 mg | ORAL_TABLET | Freq: Once | ORAL | Status: AC
  Administered 2015-09-29: 8 mg via ORAL
  Filled 2015-09-29: qty 1

## 2015-09-29 MED ORDER — LORAZEPAM 1 MG PO TABS
1.0000 mg | ORAL_TABLET | Freq: Once | ORAL | Status: AC
  Administered 2015-09-29: 1 mg via ORAL
  Filled 2015-09-29: qty 1

## 2015-09-29 MED ORDER — ACETAMINOPHEN 325 MG PO TABS: 650.0000 mg | ORAL_TABLET | ORAL | Status: DC | PRN

## 2015-09-29 NOTE — ED Notes (Signed)
Pt observed pacing and unable to sit still. Pt aggressively brushing teeth over and over at sink. Pt sts that Amber [name] does not want to live right now and sts plan to  "inject heroin" to harm herself. Pt is cooperative.

## 2015-09-29 NOTE — ED Notes (Addendum)
Patient noted in room. Pt. C/o anxiety, stable, in no acute distress. Q15 minute rounds and monitoring via [employer] to continue.

## 2015-09-29 NOTE — ED Notes (Signed)
Pt reports that she was shooting up 5 days ago and the needle broke off in her arm. Dr Phiffer updated-x ray arm

## 2015-09-29 NOTE — ED Notes (Signed)
[facility]

## 2015-09-29 NOTE — ED Notes (Signed)
Up to the bathroom 

## 2015-09-29 NOTE — BH Assessment (Signed)
Assessment completed.  Consulted with Shuvon Rankin, NP who recommends observation overnight and evaluation in the morning by psychiatry.   Davina PokeJoVea Mirna Sutcliffe, LCSW Therapeutic Triage Specialist [facility] 09/29/2015 2:14 PM
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
## 2015-09-29 NOTE — ED Notes (Signed)
Bed: WA17 Expected date: 09/29/15 Expected time: 10:34 AM Means of arrival: Ambulance Comments: Withdrawal and abd pain

## 2015-09-29 NOTE — ED Triage Notes (Signed)
[facility] c/o drug witthdrawl. Pt sts that she last used cocaine last pm and is now experiencing chills, abd pain and nausea. Pt denies emesis. Pt is requesting detox from cocaine. Pt is A&O and in NAD

## 2015-09-29 NOTE — ED Provider Notes (Signed)
[facility] Provider Note   CSN: [id number] Arrival date & time: 09/29/15  1038     History   Chief Complaint Chief Complaint  Patient presents with  . Drug Problem    HPI Amber [name] is a 25 y.o. female.   Drug Problem     Pt was seen at 1105. Per pt, c/o gradual onset and persistence of constant depression and SI for the past several days. Pt states she "doesn't want to live" and has a plan to "inject heroin" to harm herself. Pt states she has hx of cocaine abuse, LD yesterday. States she "feels like I'm in withdrawals," having nausea and feeling "jittery." Denies SA, no HI, no hallucinations.   Past Medical History:  Diagnosis Date  . Kidney stones   . MRSA (methicillin resistant staph aureus) culture positive   . Ovarian cyst   . UTI (lower urinary tract infection)     There are no active problems to display for this patient.   Past Surgical History:  Procedure Laterality Date  . FINGER SURGERY         Home Medications    Prior to Admission medications   Not on File    Family History No family history on file.  Social History Social History  Substance Use Topics  . Smoking status: Former Smoker    Types: Cigarettes  . Smokeless tobacco: Never Used  . Alcohol use No     Allergies   Review of patient's allergies indicates no known allergies.   Review of Systems Review of Systems ROS: Statement: All systems negative except as marked or noted in the HPI; Constitutional: Negative for fever and chills. +"jittery."; ; Eyes: Negative for eye pain, redness and discharge. ; ; ENMT: Negative for ear pain, hoarseness, nasal congestion, sinus pressure and sore throat. ; ; Cardiovascular: Negative for chest pain, palpitations, diaphoresis, dyspnea and peripheral edema. ; ; Respiratory: Negative for cough, wheezing and stridor. ; ; Gastrointestinal: +nausea. Negative for vomiting, diarrhea, abdominal pain, blood in stool, hematemesis, jaundice and  rectal bleeding. . ; ; Genitourinary: Negative for dysuria, flank pain and hematuria. ; ; Musculoskeletal: Negative for back pain and neck pain. Negative for swelling and trauma.; ; Skin: Negative for pruritus, rash, abrasions, blisters, bruising and skin lesion.; ; Neuro: Negative for headache, lightheadedness and neck stiffness. Negative for weakness, altered level of consciousness, altered mental status, extremity weakness, paresthesias, involuntary movement, seizure and syncope.; Psych:  +SI, no SA, no HI, no hallucinations.        Physical Exam Updated Vital Signs BP 129/93 (BP Location: Right Arm)   Pulse 74   Temp 97.9 F (36.6 C) (Oral)   Resp 20   Ht 5\' 1"  (1.549 m)   Wt 100 lb (45.4 kg)   LMP 08/29/2015 (Approximate) Comment: Pt sts "it's been about a month or so."   SpO2 98%   BMI 18.89 kg/m   Physical Exam 1110: Physical examination:  Nursing notes reviewed; Vital signs and O2 SAT reviewed;  Constitutional: Well developed, Well nourished, Well hydrated, In no acute distress; Head:  Normocephalic, atraumatic; Eyes: EOMI, PERRL, No scleral icterus; ENMT: Mouth and pharynx normal, Mucous membranes moist; Neck: Supple, Full range of motion; Cardiovascular: Regular rate and rhythm; Respiratory: Breath sounds clear, No wheezes.  Speaking full sentences with ease, Normal respiratory effort/excursion; Chest: No deformity, Movement normal; Abdomen: Nondistended; Extremities: No deformity.; Neuro: AA&Ox3, Major CN grossly intact.  Speech clear. No gross focal motor deficits in extremities. Climbs  on and off stretcher easily by herself. Gait steady.; Skin: Color normal, Warm, Dry.; Psych:  Anxious. Endorses SI.      ED Treatments / Results  Labs (all labs ordered are listed, but only abnormal results are displayed)   EKG  EKG Interpretation None       Radiology   Procedures Procedures (including critical care time)  Medications Ordered in ED Medications  ondansetron  (ZOFRAN-ODT) disintegrating tablet 8 mg (8 mg Oral Given 09/29/15 1141)  LORazepam (ATIVAN) tablet 1 mg (1 mg Oral Given 09/29/15 1141)     Initial Impression / Assessment and Plan / ED Course  I have reviewed the triage vital signs and the nursing notes.  Pertinent labs & imaging results that were available during my care of the patient were reviewed by me and considered in my medical decision making (see chart for details).  MDM Reviewed: previous chart, nursing note and vitals Reviewed previous: labs Interpretation: labs   Results for orders placed or performed during the hospital encounter of 09/29/15  Urine rapid drug screen (hosp performed)  Result Value Ref Range   Opiates NONE DETECTED NONE DETECTED   Cocaine POSITIVE (A) NONE DETECTED   Benzodiazepines NONE DETECTED NONE DETECTED   Amphetamines NONE DETECTED NONE DETECTED   Tetrahydrocannabinol POSITIVE (A) NONE DETECTED   Barbiturates NONE DETECTED NONE DETECTED  Pregnancy, urine  Result Value Ref Range   Preg Test, Ur NEGATIVE NEGATIVE  Basic metabolic panel  Result Value Ref Range   Sodium 136 135 - 145 mmol/L   Potassium 4.0 3.5 - 5.1 mmol/L   Chloride 104 101 - 111 mmol/L   CO2 24 22 - 32 mmol/L   Glucose, Bld 105 (H) 65 - 99 mg/dL   BUN 14 6 - 20 mg/dL   Creatinine, Ser 1.61 0.44 - 1.00 mg/dL   Calcium 9.8 8.9 - 09.6 mg/dL   GFR calc non Af Amer >60 >60 mL/min   GFR calc Af Amer >60 >60 mL/min   Anion gap 8 5 - 15  Ethanol  Result Value Ref Range   Alcohol, Ethyl (B) <5 <5 mg/dL  CBC with Differential  Result Value Ref Range   WBC 9.0 4.0 - 10.5 K/uL   RBC 4.82 3.87 - 5.11 MIL/uL   Hemoglobin 14.2 12.0 - 15.0 g/dL   HCT 04.5 40.9 - 81.1 %   MCV 88.4 78.0 - 100.0 fL   MCH 29.5 26.0 - 34.0 pg   MCHC 33.3 30.0 - 36.0 g/dL   RDW 91.4 78.2 - 95.6 %   Platelets 279 150 - 400 K/uL   Neutrophils Relative % 72 %   Neutro Abs 6.4 1.7 - 7.7 K/uL   Lymphocytes Relative 17 %   Lymphs Abs 1.6 0.7 - 4.0 K/uL    Monocytes Relative 9 %   Monocytes Absolute 0.8 0.1 - 1.0 K/uL   Eosinophils Relative 2 %   Eosinophils Absolute 0.2 0.0 - 0.7 K/uL   Basophils Relative 0 %   Basophils Absolute 0.0 0.0 - 0.1 K/uL    1300:  Pt washing herself in the sink on my arrival to ED exam room. Pt requested meds for nausea and anxiety: zofran and ativan given. Will have TTS consult.  Holding orders written.      Final Clinical Impressions(s) / ED Diagnoses   Final diagnoses:  None    New Prescriptions New Prescriptions   No medications on file      Samuel Jester, DO 09/29/15 1451

## 2015-09-29 NOTE — ED Notes (Signed)
Patient noted in room. No complaints, stable, in no acute distress. Q15 minute rounds and monitoring via Security Cameras to continue.  

## 2015-09-29 NOTE — ED Notes (Signed)
Up to the bathroom shower and change scrubs

## 2015-09-29 NOTE — ED Notes (Signed)
Pt ambulatory w/o difficulty to room 36 

## 2015-09-29 NOTE — ED Notes (Signed)
On the phone 

## 2015-09-29 NOTE — ED Notes (Signed)
Patient noted sleeping in room. No complaints, stable, in no acute distress. Q15 minute rounds and monitoring via Security Cameras to continue.  

## 2015-09-29 NOTE — ED Notes (Signed)
Report to include Situation, Background, Assessment, and Recommendations received from Janie Rambo RN. Patient alert and oriented, warm and dry, in no acute distress. Patient denies SI, HI, AVH and pain. Patient made aware of Q15 minute rounds and security cameras for their safety. Patient instructed to come to me with needs or concerns.  

## 2015-09-29 NOTE — ED Notes (Signed)
Pt. To xray with [facility] and Xray Techs.

## 2015-09-29 NOTE — ED Notes (Signed)
Pt walking back to [facility] accompanied by security and sitter

## 2015-09-30 ENCOUNTER — Encounter (HOSPITAL_COMMUNITY): Payer: Self-pay | Admitting: Registered Nurse

## 2015-09-30 DIAGNOSIS — F1910 Other psychoactive substance abuse, uncomplicated: Secondary | ICD-10-CM | POA: Diagnosis not present

## 2015-09-30 DIAGNOSIS — Z791 Long term (current) use of non-steroidal anti-inflammatories (NSAID): Secondary | ICD-10-CM | POA: Diagnosis not present

## 2015-09-30 DIAGNOSIS — F32A Depression, unspecified: Secondary | ICD-10-CM | POA: Diagnosis present

## 2015-09-30 DIAGNOSIS — F329 Major depressive disorder, single episode, unspecified: Secondary | ICD-10-CM | POA: Diagnosis present

## 2015-09-30 DIAGNOSIS — Z79899 Other long term (current) drug therapy: Secondary | ICD-10-CM | POA: Diagnosis not present

## 2015-09-30 DIAGNOSIS — R45851 Suicidal ideations: Secondary | ICD-10-CM | POA: Diagnosis not present

## 2015-09-30 NOTE — ED Notes (Signed)
Patient noted sleeping in room. No complaints, stable, in no acute distress. Q15 minute rounds and monitoring via Security Cameras to continue.  
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
## 2015-09-30 NOTE — ED Notes (Signed)
Patient has been discharged and given belongings, bus pass, and AVS sheet.

## 2015-09-30 NOTE — [facility] Suicide Risk Assessment (Cosign Needed)
Suicide Risk Assessment  Discharge Assessment   [facility]   Principal Problem: Polysubstance abuse Discharge Diagnoses:  Patient Active Problem List   Diagnosis Date Noted  . Depression [F32.9]   . Polysubstance abuse [F19.10]   . Suicidal ideation [R45.851]     Total Time spent with patient: 45 minutes  Musculoskeletal: Strength & Muscle Tone: within normal limits Gait & Station: normal Patient leans: N/A  Psychiatric Specialty Exam: Physical Exam  Nursing note and vitals reviewed. Constitutional: She is oriented to person, place, and time.  Neck: Normal range of motion.  Respiratory: Effort normal.  Neurological: She is alert and oriented to person, place, and time.    Review of Systems  Psychiatric/Behavioral: Positive for substance abuse. Negative for depression, hallucinations and suicidal ideas. The patient is not nervous/anxious and does not have insomnia.   All other systems reviewed and are negative.   Blood pressure 116/73, pulse 89, temperature 97.6 F (36.4 C), temperature source Oral, resp. rate 24, height 5\' 1"  (1.549 m), weight 45.4 kg (100 lb), last menstrual period 08/29/2015, SpO2 98 %.Body mass index is 18.89 kg/m.  General Appearance: Disheveled  Eye Contact:  Good  Speech:  Clear and Coherent and Normal Rate  Volume:  Normal  Mood:  "Good"  Affect:  Appropriate  Thought Process:  Coherent  Orientation:  Full (Time, Place, and Person)  Thought Content:  WDL  Suicidal Thoughts:  No  Homicidal Thoughts:  No  Memory:  Immediate;   Good Recent;   Good Remote;   Good  Judgement:  Fair  Insight:  Fair  Psychomotor Activity:  Normal  Concentration:  Concentration: Good and Attention Span: Good  Recall:  Good  Fund of Knowledge:  Fair  Language:  Good  Akathisia:  No  Handed:  Right  AIMS (if indicated):     Assets:  Communication Skills Desire for Improvement  ADL's:  Intact  Cognition:  WNL  Sleep:         Mental Status Per Nursing Assessment::   On Admission:     Demographic Factors:  Caucasian  Loss Factors: NA  Historical Factors: Impulsivity  Risk Reduction Factors:   NA  Continued Clinical Symptoms:  Alcohol/Substance Abuse/Dependencies  Cognitive Features That Contribute To Risk:  None    Suicide Risk:  Minimal: No identifiable suicidal ideation.  Patients presenting with no risk factors but with morbid ruminations; may be classified as minimal risk based on the severity of the depressive symptoms  Follow-up Information    [facility] .   Why:  Call 8am-3pm  Contact information: [address] [place] [address] [phone]           Plan Of Care/Follow-up recommendations:  Activity:  As tolerated Diet:  As tolerated  Diahn Waidelich, NP 09/30/2015, 2:59 PM

## 2015-09-30 NOTE — Progress Notes (Signed)
CSW provided pt with SA and homelessness resources. Pt reported that she would follow up upon discharge and requested clothing. CSW followed up with nurse in regards to pt's clothing request.

## 2015-09-30 NOTE — ED Notes (Signed)
Patient has received orders to d/c home. Patient has stable VS. Patient received bus pass and belongings. Will continue to monitor.

## 2016-10-03 DIAGNOSIS — F32A Depression, unspecified: Secondary | ICD-10-CM | POA: Insufficient documentation

## 2016-10-04 DIAGNOSIS — F1210 Cannabis abuse, uncomplicated: Secondary | ICD-10-CM | POA: Insufficient documentation

## 2017-11-17 ENCOUNTER — Emergency Department (HOSPITAL_COMMUNITY): Payer: Self-pay

## 2017-11-17 ENCOUNTER — Emergency Department (HOSPITAL_COMMUNITY)
Admission: EM | Admit: 2017-11-17 | Discharge: 2017-11-17 | Disposition: A | Discharge status: Home / Self Care | Payer: Self-pay | Attending: Emergency Medicine | Admitting: Emergency Medicine

## 2017-11-17 ENCOUNTER — Encounter (HOSPITAL_COMMUNITY): Payer: Self-pay | Admitting: Emergency Medicine

## 2017-11-17 DIAGNOSIS — Z87891 Personal history of nicotine dependence: Secondary | ICD-10-CM | POA: Insufficient documentation

## 2017-11-17 DIAGNOSIS — L989 Disorder of the skin and subcutaneous tissue, unspecified: Secondary | ICD-10-CM | POA: Insufficient documentation

## 2017-11-17 DIAGNOSIS — L089 Local infection of the skin and subcutaneous tissue, unspecified: Secondary | ICD-10-CM

## 2017-11-17 LAB — CBC WITH DIFFERENTIAL/PLATELET
Abs Immature Granulocytes: 0.05 10*3/uL (ref 0.00–0.07)
Basophils Absolute: 0 10*3/uL (ref 0.0–0.1)
Basophils Relative: 0 %
Eosinophils Absolute: 0.1 10*3/uL (ref 0.0–0.5)
Eosinophils Relative: 1 %
HEMATOCRIT: 40.7 % (ref 36.0–46.0)
Hemoglobin: 12.9 g/dL (ref 12.0–15.0)
IMMATURE GRANULOCYTES: 1 %
LYMPHS ABS: 1.8 10*3/uL (ref 0.7–4.0)
Lymphocytes Relative: 17 %
MCH: 28.6 pg (ref 26.0–34.0)
MCHC: 31.7 g/dL (ref 30.0–36.0)
MCV: 90.2 fL (ref 80.0–100.0)
MONO ABS: 0.9 10*3/uL (ref 0.1–1.0)
MONOS PCT: 8 %
NEUTROS PCT: 73 %
Neutro Abs: 8.1 10*3/uL — ABNORMAL HIGH (ref 1.7–7.7)
Platelets: 264 10*3/uL (ref 150–400)
RBC: 4.51 MIL/uL (ref 3.87–5.11)
RDW: 13.2 % (ref 11.5–15.5)
WBC: 11 10*3/uL — ABNORMAL HIGH (ref 4.0–10.5)
nRBC: 0 % (ref 0.0–0.2)

## 2017-11-17 LAB — BASIC METABOLIC PANEL
ANION GAP: 7 (ref 5–15)
BUN: 11 mg/dL (ref 6–20)
CO2: 26 mmol/L (ref 22–32)
Calcium: 9.2 mg/dL (ref 8.9–10.3)
Chloride: 107 mmol/L (ref 98–111)
Creatinine, Ser: 0.77 mg/dL (ref 0.44–1.00)
GFR calc Af Amer: >60 mL/min (ref >60)
GFR calc non Af Amer: >60 mL/min (ref >60)
GLUCOSE: 128 mg/dL — AB (ref 70–99)
POTASSIUM: 3.9 mmol/L (ref 3.5–5.1)
Sodium: 140 mmol/L (ref 135–145)

## 2017-11-17 NOTE — ED Triage Notes (Addendum)
Pt has sore on right side of face that patient states she tried to pop and now is swollen and red. IV drug use, opioid abuse,  hx of MRSA.

## 2017-11-17 NOTE — ED Provider Notes (Signed)
Patient placed in Quick Look pathway, seen and evaluated   Chief Complaint: abscess  HPI: Amber [name] is a 27 y.o. female who presents to the ED with 3 day hx of pain and swelling to the right side of the face. Patient reports area started as a pimple and she tried to pop it and it got larger and now is painful and red.   ROS: Skin: abscess of face  Physical Exam:  BP 117/79 (BP Location: Right Arm)   Pulse 94   Temp 99.1 F (37.3 C) (Oral)   Resp 19   Ht 5\' 1"  (1.549 m)   Wt 46.7 kg   SpO2 99%   BMI 19.46 kg/m    Gen: No distress  Neuro: Awake and Alert  Skin: raised tender area to right cheek. Tenderness extends to the right orbit. Erythema surrounding the abscess.     Initiation of care has begun. The patient has been counseled on the process, plan, and necessity for staying for the completion/evaluation, and the remainder of the medical screening examination    Janne Napoleon, NP 11/17/17 1526    Jacalyn Lefevre, MD 11/17/17 1654

## 2017-11-17 NOTE — ED Provider Notes (Signed)
[facility] EMERGENCY DEPARTMENT Provider Note   CSN: [id number] Arrival date & time: 11/17/17  1419     History   Chief Complaint Chief Complaint  Patient presents with  . Recurrent Skin Infections    HPI Amber [name] is a 27 y.o. female.  27yo female with history of MRSA, IVDA, presents with right side face pain/swelling x 3 days, onset after picking at a pimple on her face. Reports pressure sensation in her eye. Denies changes in vision, fevers, any other complaints or concerns. Does not wear glasses or contacts.      Past Medical History:  Diagnosis Date  . Kidney stones   . MRSA (methicillin resistant staph aureus) culture positive   . Ovarian cyst   . UTI (lower urinary tract infection)     Patient Active Problem List   Diagnosis Date Noted  . Depression   . Polysubstance abuse (HCC)   . Suicidal ideation     Past Surgical History:  Procedure Laterality Date  . FINGER SURGERY       OB History   None      Home Medications    Prior to Admission medications   Not on File    Family History No family history on file.  Social History Social History   Tobacco Use  . Smoking status: Former Smoker    Types: Cigarettes  . Smokeless tobacco: Never Used  Substance Use Topics  . Alcohol use: No  . Drug use: Yes    Types: Marijuana, IV    Comment: opiods- pain medication      Allergies   Patient has no known allergies.   Review of Systems Review of Systems  Constitutional: Negative for chills and fever.  HENT: Positive for facial swelling. Negative for congestion, ear pain, sinus pressure and sinus pain.   Eyes: Positive for pain. Negative for visual disturbance.  Musculoskeletal: Negative for neck pain and neck stiffness.  Skin: Positive for color change.  Allergic/Immunologic: Negative for immunocompromised state.  Neurological: Negative for headaches.  Hematological: Negative for adenopathy.    Psychiatric/Behavioral: Negative for confusion.  All other systems reviewed and are negative.    Physical Exam Updated Vital Signs BP 117/79 (BP Location: Right Arm)   Pulse 94   Temp 99.1 F (37.3 C) (Oral)   Resp 19   Ht 5\' 1"  (1.549 m)   Wt 46.7 kg   SpO2 99%   BMI 19.46 kg/m   Physical Exam  Constitutional: She is oriented to person, place, and time. She appears well-developed and well-nourished. No distress.  HENT:  Head: Normocephalic and atraumatic.    Eyes: Pupils are equal, round, and reactive to light. Conjunctivae and EOM are normal.  Pulmonary/Chest: Effort normal.  Lymphadenopathy:    She has no cervical adenopathy.  Neurological: She is alert and oriented to person, place, and time.  Skin: Skin is warm and dry. She is not diaphoretic. There is erythema.  Psychiatric: She has a normal mood and affect. Her behavior is normal.  Nursing note and vitals reviewed.    ED Treatments / Results  Labs (all labs ordered are listed, but only abnormal results are displayed) Labs Reviewed  CBC WITH DIFFERENTIAL/PLATELET - Abnormal; Notable for the following components:      Result Value   WBC 11.0 (*)    Neutro Abs 8.1 (*)    All other components within normal limits  BASIC METABOLIC PANEL - Abnormal; Notable for the following components:  Glucose, Bld 128 (*)    All other components within normal limits    EKG None  Radiology No results found.  Procedures Procedures (including critical care time)  Medications Ordered in ED Medications - No data to display   Initial Impression / Assessment and Plan / ED Course  I have reviewed the triage vital signs and the nursing notes.  Pertinent labs & imaging results that were available during my care of the patient were reviewed by me and considered in my medical decision making (see chart for details).  Clinical Course as of Nov 18 1946  Tue Nov 17, 2017  [id number] 27 year old female presents with infection in  her home right maxillary area likely secondary to picking at a pimple on her right cheek.  Extraocular movements are intact however tenderness, erythema, swelling extends to the right infraorbital area.  CT orbit with contrast was ordered to further evaluate for deep space infection.  Patient eloped prior to completing her CT scan.   [LM]    Clinical Course User Index [LM] Jeannie Fend, PA-C   Final Clinical Impressions(s) / ED Diagnoses   Final diagnoses:  Skin infection    ED Discharge Orders    None       Jeannie Fend, PA-C 11/17/17 1948    Derwood Kaplan, MD 11/18/17 [phone]

## 2017-11-17 NOTE — ED Notes (Signed)
Pt had to leave to watch niece so her mom could go to work, [employer] PA aware

## 2017-11-17 NOTE — ED Notes (Signed)
Pt stable, ambulatory, states understanding of risks of leaving

## 2017-11-17 NOTE — ED Notes (Signed)
Patient decided to leave, advised patient to stay. 

## 2017-11-20 ENCOUNTER — Inpatient Hospital Stay (HOSPITAL_COMMUNITY)
Admission: EM | Admit: 2017-11-20 | Discharge: 2017-11-24 | DRG: 603 | Discharge status: Against Medical Advice | Payer: Self-pay | Attending: Internal Medicine | Admitting: Internal Medicine

## 2017-11-20 DIAGNOSIS — Z87442 Personal history of urinary calculi: Secondary | ICD-10-CM

## 2017-11-20 DIAGNOSIS — Z87891 Personal history of nicotine dependence: Secondary | ICD-10-CM

## 2017-11-20 DIAGNOSIS — F1910 Other psychoactive substance abuse, uncomplicated: Secondary | ICD-10-CM | POA: Diagnosis present

## 2017-11-20 DIAGNOSIS — F1510 Other stimulant abuse, uncomplicated: Secondary | ICD-10-CM | POA: Diagnosis present

## 2017-11-20 DIAGNOSIS — Z5329 Procedure and treatment not carried out because of patient's decision for other reasons: Secondary | ICD-10-CM | POA: Diagnosis present

## 2017-11-20 DIAGNOSIS — R030 Elevated blood-pressure reading, without diagnosis of hypertension: Secondary | ICD-10-CM | POA: Diagnosis present

## 2017-11-20 DIAGNOSIS — Z8614 Personal history of Methicillin resistant Staphylococcus aureus infection: Secondary | ICD-10-CM

## 2017-11-20 DIAGNOSIS — L03211 Cellulitis of face: Principal | ICD-10-CM | POA: Diagnosis present

## 2017-11-20 MED ORDER — CLINDAMYCIN PHOSPHATE 900 MG/50ML IV SOLN
900.0000 mg | Freq: Once | INTRAVENOUS | Status: AC
  Administered 2017-11-21: 900 mg via INTRAVENOUS
  Filled 2017-11-20: qty 50

## 2017-11-20 NOTE — ED Provider Notes (Signed)
MSE was initiated and I personally evaluated the patient and placed orders (if any) at  11:58 PM on November 20, 2017.  Patient presents to the emergency department with a chief complaint of right-sided facial abscess.  She states that she used crystal meth about a week ago, and shortly thereafter the abscess..  She states that it has been growing in size.  It has been draining.  She now complains of pain and swelling around her right eye.  She denies any known fevers.  She has not taken anything for the symptoms.  Patient has significant abscess to her right cheek.  There is edema and mild erythema right periorbital region.  Given the extensive nature of the abscess, I do feel that CT imaging is indicated to ensure no developing periorbital abscess.  Patient will be moved to the acute side for further treatment and evaluation.  CT maxillofacial with contrast has been ordered along with appropriate labs and patient will be started on clindamycin.   The patient appears stable so that the remainder of the MSE may be completed by another provider.   Roxy Horseman, PA-C 11/21/17 0000    Gilda Crease, MD 11/21/17 [phone]

## 2017-11-20 NOTE — ED Triage Notes (Signed)
Pt reports she popped what she thought was a pimple on the right cheek and then same got infected. Pt reports pain to the right jaw and right eye.

## 2017-11-21 ENCOUNTER — Emergency Department (HOSPITAL_COMMUNITY): Payer: Self-pay

## 2017-11-21 ENCOUNTER — Other Ambulatory Visit: Payer: Self-pay

## 2017-11-21 ENCOUNTER — Encounter (HOSPITAL_COMMUNITY): Payer: Self-pay | Admitting: Emergency Medicine

## 2017-11-21 DIAGNOSIS — L03211 Cellulitis of face: Secondary | ICD-10-CM | POA: Diagnosis present

## 2017-11-21 LAB — BASIC METABOLIC PANEL
ANION GAP: 11 (ref 5–15)
BUN: 11 mg/dL (ref 6–20)
CO2: 25 mmol/L (ref 22–32)
CREATININE: 0.75 mg/dL (ref 0.44–1.00)
Calcium: 10 mg/dL (ref 8.9–10.3)
Chloride: 100 mmol/L (ref 98–111)
GFR calc non Af Amer: >60 mL/min (ref >60)
Glucose, Bld: 118 mg/dL — ABNORMAL HIGH (ref 70–99)
Potassium: 3.6 mmol/L (ref 3.5–5.1)
Sodium: 136 mmol/L (ref 135–145)

## 2017-11-21 LAB — CBC WITH DIFFERENTIAL/PLATELET
Abs Immature Granulocytes: 0.04 10*3/uL (ref 0.00–0.07)
BASOS PCT: 0 %
Basophils Absolute: 0.1 10*3/uL (ref 0.0–0.1)
EOS ABS: 0.3 10*3/uL (ref 0.0–0.5)
Eosinophils Relative: 2 %
HCT: 43.7 % (ref 36.0–46.0)
Hemoglobin: 13.8 g/dL (ref 12.0–15.0)
IMMATURE GRANULOCYTES: 0 %
Lymphocytes Relative: 18 %
Lymphs Abs: 2.2 10*3/uL (ref 0.7–4.0)
MCH: 28.2 pg (ref 26.0–34.0)
MCHC: 31.6 g/dL (ref 30.0–36.0)
MCV: 89.4 fL (ref 80.0–100.0)
Monocytes Absolute: 1.2 10*3/uL — ABNORMAL HIGH (ref 0.1–1.0)
Monocytes Relative: 9 %
NEUTROS PCT: 71 %
Neutro Abs: 8.8 10*3/uL — ABNORMAL HIGH (ref 1.7–7.7)
PLATELETS: 291 10*3/uL (ref 150–400)
RBC: 4.89 MIL/uL (ref 3.87–5.11)
RDW: 13.1 % (ref 11.5–15.5)
WBC: 12.6 10*3/uL — AB (ref 4.0–10.5)
nRBC: 0 % (ref 0.0–0.2)

## 2017-11-21 LAB — MRSA PCR SCREENING: MRSA by PCR: POSITIVE — AB

## 2017-11-21 LAB — HIV ANTIBODY (ROUTINE TESTING W REFLEX): HIV Screen 4th Generation wRfx: NONREACTIVE

## 2017-11-21 IMAGING — CT CT MAXILLOFACIAL W/ CM
3 of 6 series · 15 of 47 positions shown, 18 images · IV contrast (APPLIED)
Comparison: None.

CLINICAL DATA: Initial evaluation for acute right-sided facial
swelling.

EXAM:
CT MAXILLOFACIAL WITH CONTRAST
TECHNIQUE: Multidetector CT imaging of the maxillofacial structures was
performed with intravenous contrast. Multiplanar CT image
reconstructions were also generated.
CONTRAST:  75mL OMNIPAQUE IOHEXOL 300 MG/ML  SOLN

[Series 3: maxilllofacial 2.0 hr40 3 · axial · 0.33mm/px · z∈[-190,-52]mm · 10 of 81 slices shown, 13 images]
[im 6/81  brain]
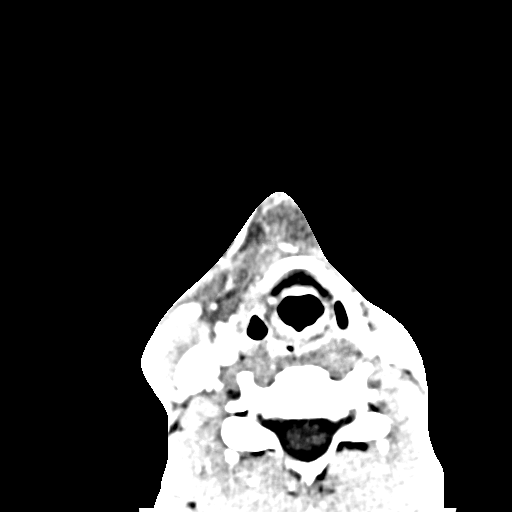
[im 6/81  bone]
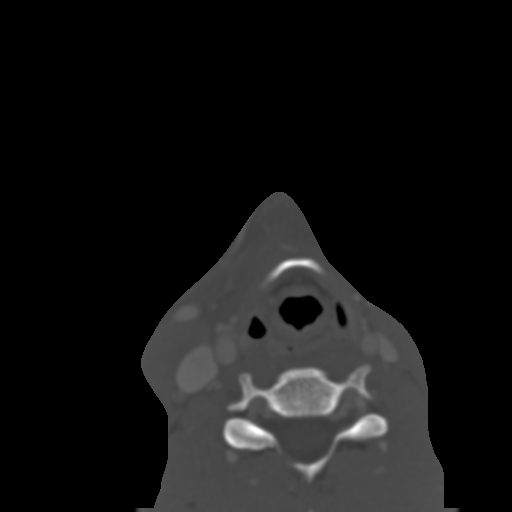
[im 12/81  bone]
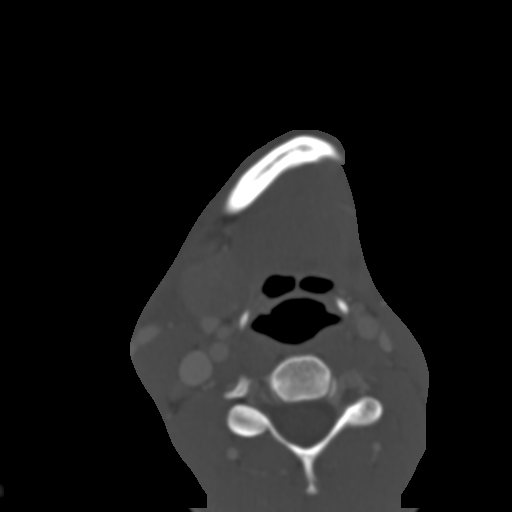
[im 23/81  bone]
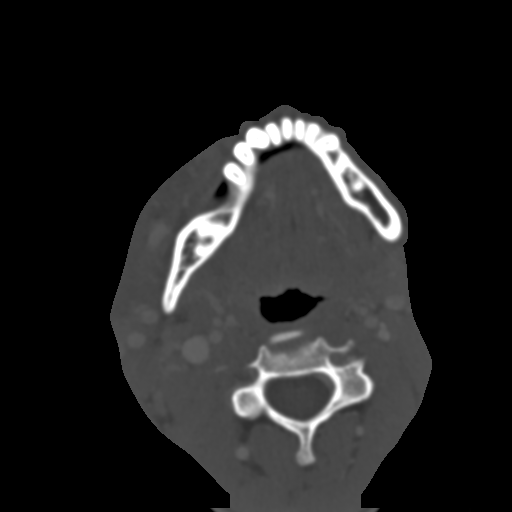
[im 29/81  bone]
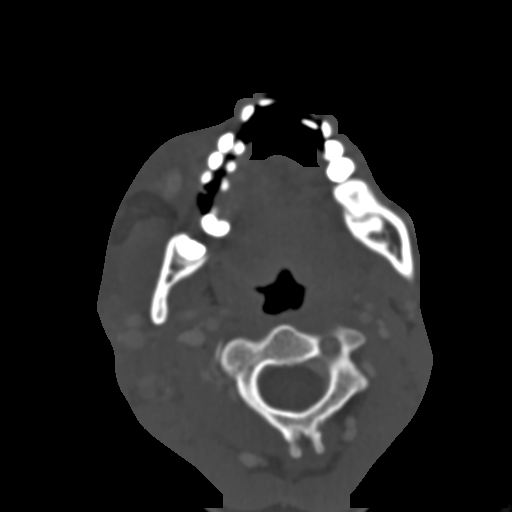
[im 35/81  brain]
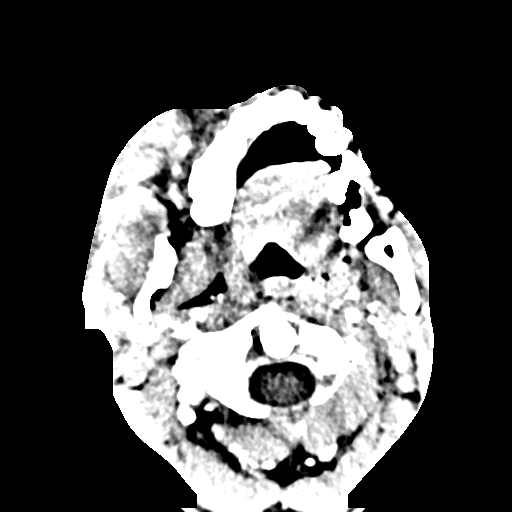
[im 35/81  bone]
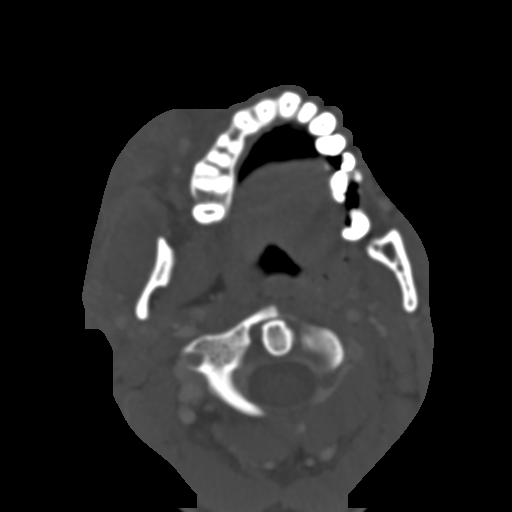
[im 46/81  bone]
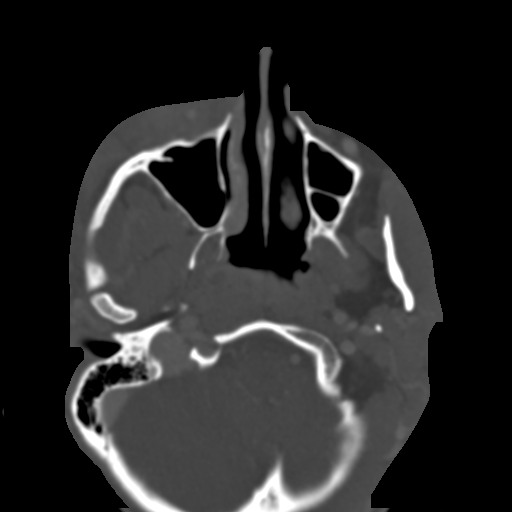
[im 52/81  bone]
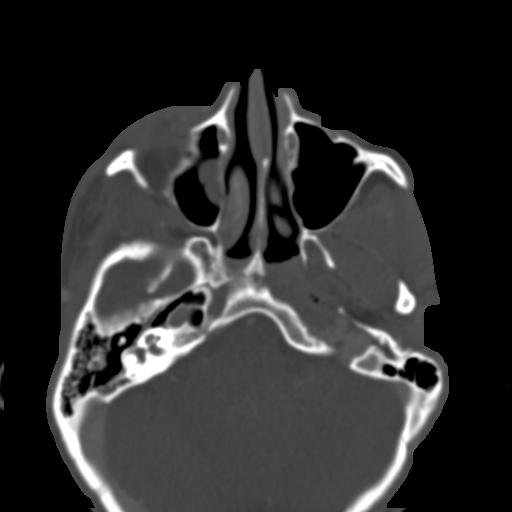
[im 58/81  bone]
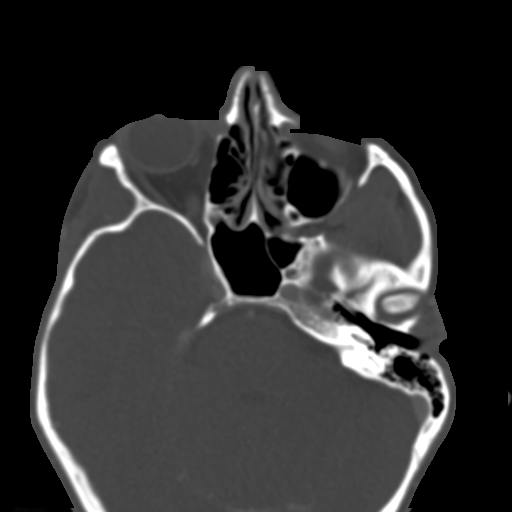
[im 69/81  brain]
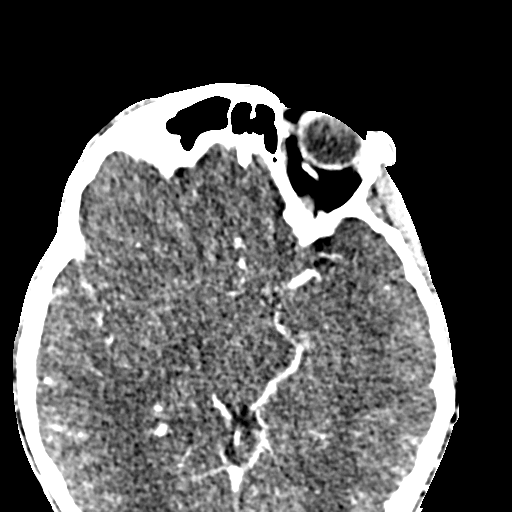
[im 69/81  bone]
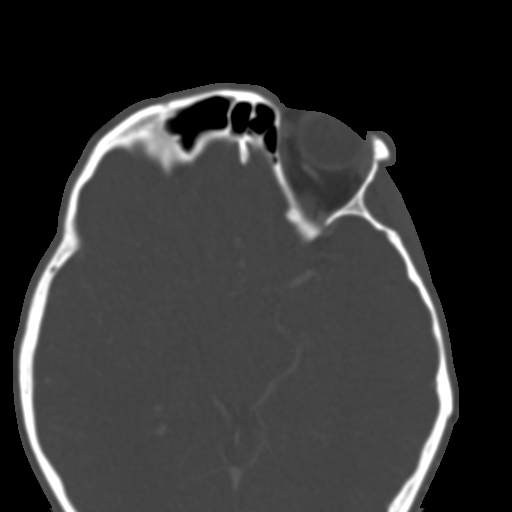
[im 75/81  bone]
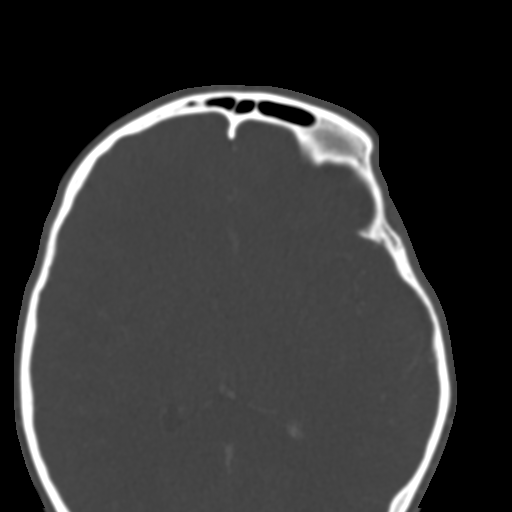

[Series 7: st cor · coronal · 0.31mm/px · 3 of 80 slices shown]
[im 20/80  bone]
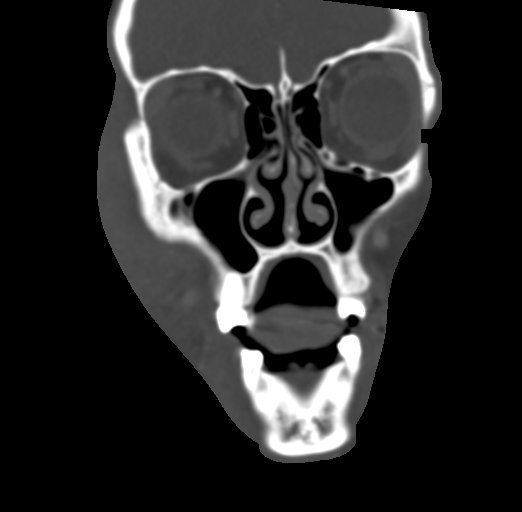
[im 40/80  bone]
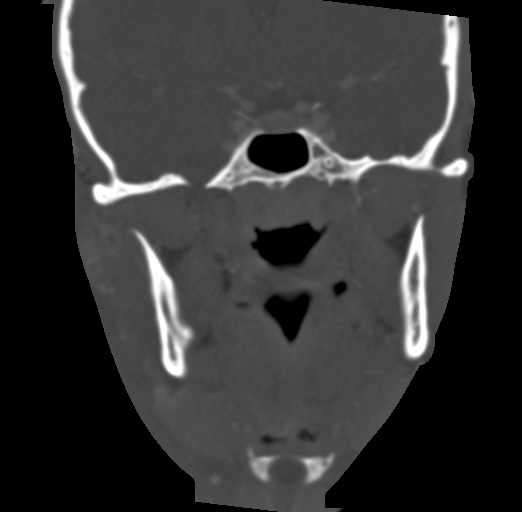
[im 60/80  bone]
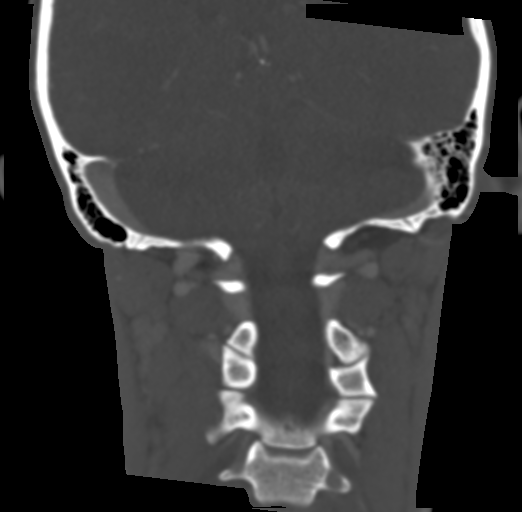

[Series 10: bone sag · sagittal · 0.27mm/px · 2 of 76 slices shown]
[im 26/76  bone]
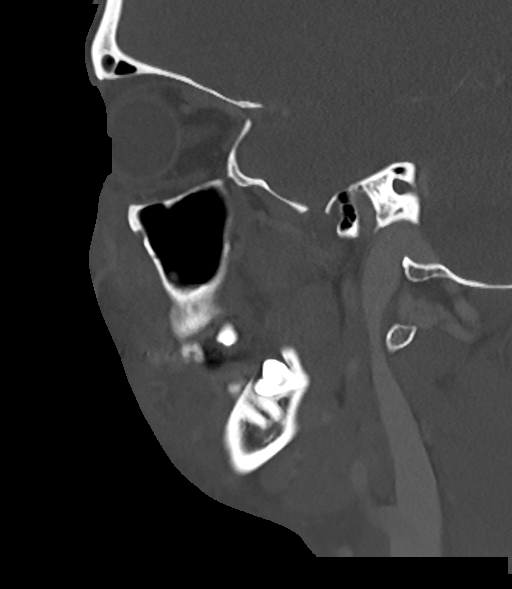
[im 51/76  bone]
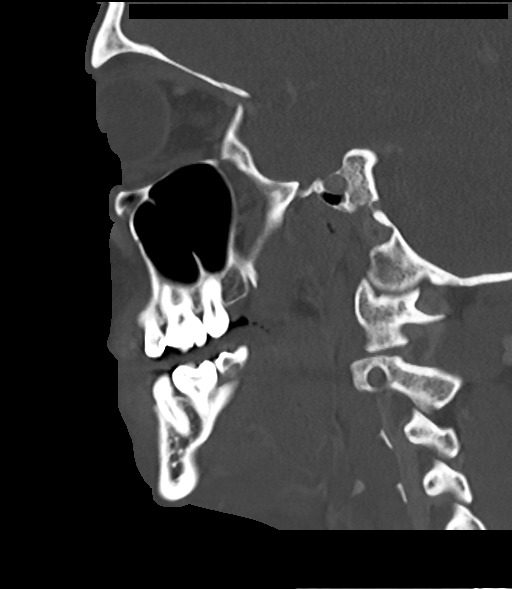

[15 of 47 positions shown; findings below may reference images not displayed]

FINDINGS: Osseous: No acute osseous abnormality about the face. No acute
abnormality about the dentition.

Orbits: Globes and orbital soft tissues within normal limits. No
evidence for postseptal or intraorbital cellulitis.

Sinuses: Mild scattered mucosal thickening involving the ethmoidal
air cells, sphenoid sinuses, and maxillary sinuses. Paranasal
sinuses are otherwise clear. Mastoid air cells and middle ear
cavities are well pneumatized and free of fluid.

Soft tissues: Extensive soft tissue swelling with inflammatory
stranding seen involving the right face, concerning for acute
infection/cellulitis. Changes predominantly centered at the pre
maxillary soft tissues, but do extend superiorly to involve the
right periorbital soft tissues. No postseptal or intraorbital
extension identified at this time. Lateral extension into the right
temporal region and right parotid space. Inferior extension to
involve the right masticator and submandibular spaces. Complex
ill-defined hypodensity measuring approximately 13 x 8 x 7 mm
centered at the right pre maxillary soft tissues concerning for
phlegmon and/or early abscess (series 3, image 48). Possible
communicating sinus tract towards the overlying skin noted. No other
discrete or drainable fluid collections identified.

Few scattered right-sided cervical lymph nodes measuring up to 7-8
mm noted, likely reactive.

Limited intracranial: Unremarkable.
IMPRESSION: Extensive soft tissue swelling with inflammatory stranding
throughout the right face as above, concerning for acute cellulitis.
Superimposed ill-defined hypodensity measuring approximately 13 x 8
x 7 mm at the level of the pre maxillary soft tissues concerning for
phlegmon and/or early abscess. No other discrete or drainable fluid
collections identified. No postseptal or intraorbital extension at
this time.

## 2017-11-21 MED ORDER — SODIUM CHLORIDE 0.9% FLUSH
3.0000 mL | Freq: Two times a day (BID) | INTRAVENOUS | Status: DC
  Administered 2017-11-21 – 2017-11-24 (×5): 3 mL via INTRAVENOUS

## 2017-11-21 MED ORDER — SENNOSIDES-DOCUSATE SODIUM 8.6-50 MG PO TABS: 1.0000 | ORAL_TABLET | Freq: Every evening | ORAL | Status: DC | PRN

## 2017-11-21 MED ORDER — VITAMIN B-1 100 MG PO TABS
100.0000 mg | ORAL_TABLET | Freq: Every day | ORAL | Status: DC
  Administered 2017-11-21 – 2017-11-24 (×4): 100 mg via ORAL
  Filled 2017-11-21 (×4): qty 1

## 2017-11-21 MED ORDER — LORAZEPAM 1 MG PO TABS
1.0000 mg | ORAL_TABLET | Freq: Four times a day (QID) | ORAL | Status: AC | PRN
  Administered 2017-11-22 – 2017-11-24 (×3): 1 mg via ORAL
  Filled 2017-11-21 (×3): qty 1

## 2017-11-21 MED ORDER — KETOROLAC TROMETHAMINE 30 MG/ML IJ SOLN: 30.0000 mg | Freq: Four times a day (QID) | INTRAMUSCULAR | Status: DC | PRN

## 2017-11-21 MED ORDER — CLINDAMYCIN PHOSPHATE 600 MG/50ML IV SOLN
600.0000 mg | Freq: Three times a day (TID) | INTRAVENOUS | Status: DC
  Administered 2017-11-21 – 2017-11-22 (×4): 600 mg via INTRAVENOUS
  Filled 2017-11-21 (×4): qty 50

## 2017-11-21 MED ORDER — BISACODYL 5 MG PO TBEC: 5.0000 mg | DELAYED_RELEASE_TABLET | Freq: Every day | ORAL | Status: DC | PRN

## 2017-11-21 MED ORDER — ONDANSETRON HCL 4 MG/2ML IJ SOLN: 4.0000 mg | Freq: Four times a day (QID) | INTRAMUSCULAR | Status: DC | PRN

## 2017-11-21 MED ORDER — THIAMINE HCL 100 MG/ML IJ SOLN: 100.0000 mg | Freq: Every day | INTRAMUSCULAR | Status: DC

## 2017-11-21 MED ORDER — ONDANSETRON HCL 4 MG PO TABS: 4.0000 mg | ORAL_TABLET | Freq: Four times a day (QID) | ORAL | Status: DC | PRN

## 2017-11-21 MED ORDER — HYDRALAZINE HCL 20 MG/ML IJ SOLN: 10.0000 mg | INTRAMUSCULAR | Status: DC | PRN

## 2017-11-21 MED ORDER — TRAMADOL HCL 50 MG PO TABS: 50.0000 mg | ORAL_TABLET | Freq: Four times a day (QID) | ORAL | Status: DC | PRN

## 2017-11-21 MED ORDER — ACETAMINOPHEN 325 MG PO TABS
650.0000 mg | ORAL_TABLET | Freq: Four times a day (QID) | ORAL | Status: DC | PRN
  Administered 2017-11-21 (×3): 650 mg via ORAL
  Filled 2017-11-21 (×4): qty 2

## 2017-11-21 MED ORDER — MUPIROCIN 2 % EX OINT
1.0000 "application " | TOPICAL_OINTMENT | Freq: Two times a day (BID) | CUTANEOUS | Status: DC
  Administered 2017-11-21 – 2017-11-24 (×7): 1 via NASAL
  Filled 2017-11-21: qty 22

## 2017-11-21 MED ORDER — SODIUM CHLORIDE 0.9 % IV SOLN
INTRAVENOUS | Status: DC | PRN
  Administered 2017-11-21: 250 mL via INTRAVENOUS

## 2017-11-21 MED ORDER — CHLORHEXIDINE GLUCONATE CLOTH 2 % EX PADS
6.0000 | MEDICATED_PAD | Freq: Every day | CUTANEOUS | Status: DC
  Administered 2017-11-22 – 2017-11-24 (×3): 6 via TOPICAL

## 2017-11-21 MED ORDER — LORAZEPAM 2 MG/ML IJ SOLN
1.0000 mg | Freq: Four times a day (QID) | INTRAMUSCULAR | Status: AC | PRN
  Administered 2017-11-22 – 2017-11-24 (×2): 1 mg via INTRAVENOUS
  Filled 2017-11-21 (×2): qty 1

## 2017-11-21 MED ORDER — IOHEXOL 300 MG/ML  SOLN
75.0000 mL | Freq: Once | INTRAMUSCULAR | Status: AC | PRN
  Administered 2017-11-21: 75 mL via INTRAVENOUS

## 2017-11-21 MED ORDER — FOLIC ACID 1 MG PO TABS
1.0000 mg | ORAL_TABLET | Freq: Every day | ORAL | Status: DC
  Administered 2017-11-21 – 2017-11-24 (×4): 1 mg via ORAL
  Filled 2017-11-21 (×4): qty 1

## 2017-11-21 MED ORDER — ADULT MULTIVITAMIN W/MINERALS CH
1.0000 | ORAL_TABLET | Freq: Every day | ORAL | Status: DC
  Administered 2017-11-21 – 2017-11-24 (×4): 1 via ORAL
  Filled 2017-11-21 (×4): qty 1

## 2017-11-21 MED ORDER — SODIUM CHLORIDE 0.9 % IV SOLN
250.0000 mL | INTRAVENOUS | Status: DC | PRN
  Administered 2017-11-21: 250 mL via INTRAVENOUS

## 2017-11-21 MED ORDER — KETOROLAC TROMETHAMINE 30 MG/ML IJ SOLN
30.0000 mg | Freq: Four times a day (QID) | INTRAMUSCULAR | Status: DC | PRN
  Administered 2017-11-22: 30 mg via INTRAVENOUS
  Filled 2017-11-21: qty 1

## 2017-11-21 MED ORDER — HYDROXYZINE HCL 25 MG PO TABS
25.0000 mg | ORAL_TABLET | Freq: Three times a day (TID) | ORAL | Status: DC | PRN
  Administered 2017-11-23: 50 mg via ORAL
  Filled 2017-11-21 (×2): qty 2

## 2017-11-21 MED ORDER — SODIUM CHLORIDE 0.9% FLUSH: 3.0000 mL | INTRAVENOUS | Status: DC | PRN

## 2017-11-21 MED ORDER — ACETAMINOPHEN 650 MG RE SUPP: 650.0000 mg | Freq: Four times a day (QID) | RECTAL | Status: DC | PRN

## 2017-11-21 NOTE — ED Notes (Signed)
To ct

## 2017-11-21 NOTE — H&P (Signed)
History and Physical    Amber [name] ZOX:[id number] DOB: [birth year] DOA: 11/20/2017  PCP: Patient, No Pcp Per   Patient coming from: Home   Chief Complaint: Right facial pain and swelling   HPI: Amber [name] is a 27 y.o. female with medical history significant for polysubstance abuse, now presenting to the emergency department for evaluation of progressive pain, swelling, and redness involving the right cheek.  She reports developing a small red bump on her right cheek approximately a week ago that she describes as a pimple, reports squeezing it but unable to express much.  Over the ensuing days, there has been increasing redness, swelling, and pain at the site and there was spontaneous drainage a couple days ago.  She was seen in the emergency department for this on 11/17/2017, CT was ordered for further assessment, but the patient left the hospital prior to imaging being performed.  She denies any fevers or chills, denies eye pain or change in her vision.  She denies any other wounds or abscesses, at this time but she reports a history of boils.  She has not been on antibiotics recently.  She acknowledges ongoing drug use.  ED Course: Upon arrival to the ED, patient is found to be afebrile, saturating well on room air, and with blood pressure 136/100.  Chemistry panel is unremarkable and CBC is notable for leukocytosis to 12,600, increased from 11.0 four days earlier.  Maxillofacial CT reveals extensive soft tissue swelling with inflammatory stranding throughout the right face concerning for acute cellulitis.  Also noted on CT is a superimposed ill-defined hypodensity, likely representing a phlegmon or early abscess, but no post septal extension.  She was given a dose of IV clindamycin in the ED.  Given rapid progression and proximity to the eye, she will be observed in the hospital for an initial phase of treatment.  Review of Systems:  All other systems reviewed and apart from HPI, are  negative.  Past Medical History:  Diagnosis Date  . Kidney stones   . MRSA (methicillin resistant staph aureus) culture positive   . Ovarian cyst   . UTI (lower urinary tract infection)     Past Surgical History:  Procedure Laterality Date  . FINGER SURGERY       reports that she has quit smoking. Her smoking use included cigarettes. She has never used smokeless tobacco. She reports that she has current or past drug history. Drugs: Marijuana and IV. She reports that she does not drink alcohol.  No Known Allergies  History reviewed. No pertinent family history.   Prior to Admission medications   Not on File    Physical Exam: Vitals:   11/20/17 2348 11/21/17 0000 11/21/17 0022 11/21/17 0100  BP: (!) 151/107  (!) 129/98 (!) 136/100  Pulse: 97  80 81  Resp: 20     Temp: 97.6 F (36.4 C)     TempSrc: Oral     SpO2: 98%  98% 98%  Weight:  46.3 kg    Height:  5\' 1"  (1.549 m)       Constitutional: NAD, calm  Eyes: PERTLA, lids and conjunctivae normal ENMT: Mucous membranes are moist. Posterior pharynx clear of any exudate or lesions.   Neck: normal, supple, no masses, no thyromegaly Respiratory: clear to auscultation bilaterally, no wheezing, no crackles. Normal respiratory effort.   Cardiovascular: S1 & S2 heard, regular rate and rhythm. No extremity edema.   Abdomen: No distension, no tenderness, soft. Bowel sounds normal.  Musculoskeletal: no clubbing / cyanosis. No joint deformity upper and lower extremities.    Skin: Erythematous nodule at infraorbital right face with central eschar and surrounding erythema, edema, heat, and tenderness. Otherwise warm, dry, well-perfused. Neurologic: PERRL, EOMI. Sensation intact. Moving all extremities.  Psychiatric: Sleeping, easily woken and oriented x 3. Calm, cooperative.    Labs on Admission: I have personally reviewed following labs and imaging studies  CBC: Recent Labs  Lab 11/17/17 1642 11/21/17 0016  WBC 11.0* 12.6*   NEUTROABS 8.1* 8.8*  HGB 12.9 13.8  HCT 40.7 43.7  MCV 90.2 89.4  PLT 264 291   Basic Metabolic Panel: Recent Labs  Lab 11/17/17 1642 11/21/17 0016  NA 140 136  K 3.9 3.6  CL 107 100  CO2 26 25  GLUCOSE 128* 118*  BUN 11 11  CREATININE 0.77 0.75  CALCIUM 9.2 10.0   GFR: Estimated Creatinine Clearance: 77.2 mL/min (by C-G formula based on SCr of 0.75 mg/dL). Liver Function Tests: No results for input(s): AST, ALT, ALKPHOS, BILITOT, PROT, ALBUMIN in the last 168 hours. No results for input(s): LIPASE, AMYLASE in the last 168 hours. No results for input(s): AMMONIA in the last 168 hours. Coagulation Profile: No results for input(s): INR, PROTIME in the last 168 hours. Cardiac Enzymes: No results for input(s): CKTOTAL, CKMB, CKMBINDEX, TROPONINI in the last 168 hours. BNP (last 3 results) No results for input(s): PROBNP in the last 8760 hours. HbA1C: No results for input(s): HGBA1C in the last 72 hours. CBG: No results for input(s): GLUCAP in the last 168 hours. Lipid Profile: No results for input(s): CHOL, HDL, LDLCALC, TRIG, CHOLHDL, LDLDIRECT in the last 72 hours. Thyroid Function Tests: No results for input(s): TSH, T4TOTAL, FREET4, T3FREE, THYROIDAB in the last 72 hours. Anemia Panel: No results for input(s): VITAMINB12, FOLATE, FERRITIN, TIBC, IRON, RETICCTPCT in the last 72 hours. Urine analysis:    Component Value Date/Time   COLORURINE Yellow 01/27/2014 1811   COLORURINE YELLOW 10/14/2012 2010   APPEARANCEUR Turbid 01/27/2014 1811   LABSPEC 1.011 01/27/2014 1811   PHURINE 9.0 01/27/2014 1811   PHURINE 6.5 10/14/2012 2010   GLUCOSEU Negative 01/27/2014 1811   HGBUR Negative 01/27/2014 1811   HGBUR NEGATIVE 10/14/2012 2010   BILIRUBINUR Negative 01/27/2014 1811   KETONESUR Negative 01/27/2014 1811   KETONESUR NEGATIVE 10/14/2012 2010   PROTEINUR Negative 01/27/2014 1811   PROTEINUR NEGATIVE 10/14/2012 2010   UROBILINOGEN 0.2 10/14/2012 2010   NITRITE  Negative 01/27/2014 1811   NITRITE NEGATIVE 10/14/2012 2010   LEUKOCYTESUR Negative 01/27/2014 1811   Sepsis Labs: @LABRCNTIP (procalcitonin:4,lacticidven:4) )No results found for this or any previous visit (from the past 240 hour(s)).   Radiological Exams on Admission: Ct Maxillofacial W Contrast  Result Date: 11/21/2017 CLINICAL DATA:  Initial evaluation for acute right-sided facial swelling. EXAM: CT MAXILLOFACIAL WITH CONTRAST TECHNIQUE: Multidetector CT imaging of the maxillofacial structures was performed with intravenous contrast. Multiplanar CT image reconstructions were also generated. CONTRAST:  75mL OMNIPAQUE IOHEXOL 300 MG/ML  SOLN COMPARISON:  None. FINDINGS: Osseous: No acute osseous abnormality about the face. No acute abnormality about the dentition. Orbits: Globes and orbital soft tissues within normal limits. No evidence for postseptal or intraorbital cellulitis. Sinuses: Mild scattered mucosal thickening involving the ethmoidal air cells, sphenoid sinuses, and maxillary sinuses. Paranasal sinuses are otherwise clear. Mastoid air cells and middle ear cavities are well pneumatized and free of fluid. Soft tissues: Extensive soft tissue swelling with inflammatory stranding seen involving the right face, concerning for acute  infection/cellulitis. Changes predominantly centered at the pre maxillary soft tissues, but do extend superiorly to involve the right periorbital soft tissues. No postseptal or intraorbital extension identified at this time. Lateral extension into the right temporal region and right parotid space. Inferior extension to involve the right masticator and submandibular spaces. Complex ill-defined hypodensity measuring approximately 13 x 8 x 7 mm centered at the right pre maxillary soft tissues concerning for phlegmon and/or early abscess (series 3, image 48). Possible communicating sinus tract towards the overlying skin noted. No other discrete or drainable fluid collections  identified. Few scattered right-sided cervical lymph nodes measuring up to 7-8 mm noted, likely reactive. Limited intracranial: Unremarkable. IMPRESSION: Extensive soft tissue swelling with inflammatory stranding throughout the right face as above, concerning for acute cellulitis. Superimposed ill-defined hypodensity measuring approximately 13 x 8 x 7 mm at the level of the pre maxillary soft tissues concerning for phlegmon and/or early abscess. No other discrete or drainable fluid collections identified. No postseptal or intraorbital extension at this time. Electronically Signed   By: Rise Mu M.D.   On: 11/21/2017 02:24    EKG: Not performed.   Assessment/Plan   1. Facial cellulitis  - Presents with ~1 wk of progressive redness, swelling, and pain involving right face  - Maxillofacial CT extensive swelling and inflammation but no postseptal involvement; there is a phlegmon or early abscess noted on CT, but too small to attempt drainage in ED  - Treated with IV clindamycin in ED  - Continue treatment with clindamycin   2. Elevated BP - DBP in 100-range in ED  - Pain and/or amphetamine use may be contributing  - Use hydralazine IVP's as needed   3. Polysubstance abuse  - She acknowledges recent methamphetamine use  - Counseled    DVT prophylaxis: SCD's  Code Status: Full  Family Communication: Discussed with patient  Consults called: None Admission status: Observation     Briscoe Deutscher, MD [facility] Pager [phone]  If 7PM-7AM, please contact night-coverage [email or web address] Password [email or web address]  11/21/2017, 3:30 AM

## 2017-11-21 NOTE — ED Notes (Signed)
The pt is very sleepy  She reports that she has not had very much sleep

## 2017-11-21 NOTE — Progress Notes (Signed)
Patient admitted after midnight, please see H&P.  Here with a facial cellulitis.  No eye pain, blurry vision.  Continue IV abx (clindamycin--->  Change to IV vanc if not improving after 48 hours).  Warm compresses.  No need for surgical intervention currently.    Marlin Canary DO

## 2017-11-21 NOTE — Progress Notes (Signed)
Admission note:  Arrival Method: Patient arrived from [facility] in w/c.   Mental Orientation:  Alert and oriented x 4. Telemetry: N/A Assessment: See doc flow sheets. Skin: Intact, except for the abscess on the right cheeck. IV: L AC SL. Pain: Denies any pain. Tubes: N/A Safety Measures: Bed in low position, call bell and phone within reach. Fall Prevention Safety Plan: Reviewed the plan, verbalized understanding. Admission Screening: Completed. 6700 Orientation: Patient has been oriented to the unit, staff and to the room.

## 2017-11-21 NOTE — ED Provider Notes (Signed)
[facility] EMERGENCY DEPARTMENT Provider Note   CSN: [id number] Arrival date & time: 11/20/17  2337     History   Chief Complaint Chief Complaint  Patient presents with  . Abscess    HPI Amber [name] is a 27 y.o. female.  Patient presents to the emergency department for evaluation of pain and swelling of the right side of her face.  Symptoms began at least 5 days ago.  She reports that it started as a small bump but has rapidly worsened over time.  She reports that she initially tried to squeeze it to pop what she thought was a pimple before it rapidly worsened.  Patient complaining of significant pain and swelling on the right side of her face, no eye pain or change in vision.     Past Medical History:  Diagnosis Date  . Kidney stones   . MRSA (methicillin resistant staph aureus) culture positive   . Ovarian cyst   . UTI (lower urinary tract infection)     Patient Active Problem List   Diagnosis Date Noted  . Depression   . Polysubstance abuse (HCC)   . Suicidal ideation     Past Surgical History:  Procedure Laterality Date  . FINGER SURGERY       OB History   None      Home Medications    Prior to Admission medications   Not on File    Family History No family history on file.  Social History Social History   Tobacco Use  . Smoking status: Former Smoker    Types: Cigarettes  . Smokeless tobacco: Never Used  Substance Use Topics  . Alcohol use: No  . Drug use: Yes    Types: Marijuana, IV    Comment: opiods- pain medication      Allergies   Patient has no known allergies.   Review of Systems Review of Systems  Skin: Positive for wound.  All other systems reviewed and are negative.    Physical Exam Updated Vital Signs BP (!) 136/100   Pulse 81   Temp 97.6 F (36.4 C) (Oral)   Resp 20   Ht 5\' 1"  (1.549 m)   Wt 46.3 kg   LMP 09/09/2017 (Approximate)   SpO2 98%   BMI 19.27 kg/m   Physical Exam   Constitutional: She is oriented to person, place, and time. She appears well-developed and well-nourished. No distress.  HENT:  Head: Normocephalic and atraumatic.    Right Ear: Hearing normal.  Left Ear: Hearing normal.  Nose: Nose normal.  Mouth/Throat: Oropharynx is clear and moist and mucous membranes are normal.  Eyes: Pupils are equal, round, and reactive to light. Conjunctivae and EOM are normal.  Neck: Normal range of motion. Neck supple.  Cardiovascular: Regular rhythm, S1 normal and S2 normal. Exam reveals no gallop and no friction rub.  No murmur heard. Pulmonary/Chest: Effort normal and breath sounds normal. No respiratory distress. She exhibits no tenderness.  Abdominal: Soft. Normal appearance and bowel sounds are normal. There is no hepatosplenomegaly. There is no tenderness. There is no rebound, no guarding, no tenderness at McBurney's point and negative Murphy's sign. No hernia.  Musculoskeletal: Normal range of motion.  Neurological: She is alert and oriented to person, place, and time. She has normal strength. No cranial nerve deficit or sensory deficit. Coordination normal. GCS eye subscore is 4. GCS verbal subscore is 5. GCS motor subscore is 6.  Skin: Skin is warm, dry and intact.  No rash noted. No cyanosis.  Psychiatric: She has a normal mood and affect. Her speech is normal and behavior is normal. Thought content normal.  Nursing note and vitals reviewed.      ED Treatments / Results  Labs (all labs ordered are listed, but only abnormal results are displayed) Labs Reviewed  CBC WITH DIFFERENTIAL/PLATELET - Abnormal; Notable for the following components:      Result Value   WBC 12.6 (*)    Neutro Abs 8.8 (*)    Monocytes Absolute 1.2 (*)    All other components within normal limits  BASIC METABOLIC PANEL - Abnormal; Notable for the following components:   Glucose, Bld 118 (*)    All other components within normal limits    EKG None  Radiology Ct  Maxillofacial W Contrast  Result Date: 11/21/2017 CLINICAL DATA:  Initial evaluation for acute right-sided facial swelling. EXAM: CT MAXILLOFACIAL WITH CONTRAST TECHNIQUE: Multidetector CT imaging of the maxillofacial structures was performed with intravenous contrast. Multiplanar CT image reconstructions were also generated. CONTRAST:  75mL OMNIPAQUE IOHEXOL 300 MG/ML  SOLN COMPARISON:  None. FINDINGS: Osseous: No acute osseous abnormality about the face. No acute abnormality about the dentition. Orbits: Globes and orbital soft tissues within normal limits. No evidence for postseptal or intraorbital cellulitis. Sinuses: Mild scattered mucosal thickening involving the ethmoidal air cells, sphenoid sinuses, and maxillary sinuses. Paranasal sinuses are otherwise clear. Mastoid air cells and middle ear cavities are well pneumatized and free of fluid. Soft tissues: Extensive soft tissue swelling with inflammatory stranding seen involving the right face, concerning for acute infection/cellulitis. Changes predominantly centered at the pre maxillary soft tissues, but do extend superiorly to involve the right periorbital soft tissues. No postseptal or intraorbital extension identified at this time. Lateral extension into the right temporal region and right parotid space. Inferior extension to involve the right masticator and submandibular spaces. Complex ill-defined hypodensity measuring approximately 13 x 8 x 7 mm centered at the right pre maxillary soft tissues concerning for phlegmon and/or early abscess (series 3, image 48). Possible communicating sinus tract towards the overlying skin noted. No other discrete or drainable fluid collections identified. Few scattered right-sided cervical lymph nodes measuring up to 7-8 mm noted, likely reactive. Limited intracranial: Unremarkable. IMPRESSION: Extensive soft tissue swelling with inflammatory stranding throughout the right face as above, concerning for acute cellulitis.  Superimposed ill-defined hypodensity measuring approximately 13 x 8 x 7 mm at the level of the pre maxillary soft tissues concerning for phlegmon and/or early abscess. No other discrete or drainable fluid collections identified. No postseptal or intraorbital extension at this time. Electronically Signed   By: Rise Mu M.D.   On: 11/21/2017 02:24    Procedures Procedures (including critical care time)  Medications Ordered in ED Medications  clindamycin (CLEOCIN) IVPB 900 mg (0 mg Intravenous Stopped 11/21/17 0134)  iohexol (OMNIPAQUE) 300 MG/ML solution 75 mL (75 mLs Intravenous Contrast Given 11/21/17 0139)     Initial Impression / Assessment and Plan / ED Course  I have reviewed the triage vital signs and the nursing notes.  Pertinent labs & imaging results that were available during my care of the patient were reviewed by me and considered in my medical decision making (see chart for details).     She presents with rapidly worsening infection of the right maxillary area of her face.  CT scan shows mostly signs of cellulitis, possibly a small area of phlegmon versus developing abscess that would not be drainable due to  its small size.  She is afebrile, does not appear septic, will based on the rapid onset, amount of swelling in proximity to the eye, initiated on IV clindamycin and will admit for repeated doses.  Final Clinical Impressions(s) / ED Diagnoses   Final diagnoses:  Cellulitis of face    ED Discharge Orders    None       Gilda Crease, MD 11/21/17 [phone]

## 2017-11-22 LAB — CBC WITH DIFFERENTIAL/PLATELET
ABS IMMATURE GRANULOCYTES: 0.02 10*3/uL (ref 0.00–0.07)
BASOS PCT: 1 %
Basophils Absolute: 0 10*3/uL (ref 0.0–0.1)
Eosinophils Absolute: 0.2 10*3/uL (ref 0.0–0.5)
Eosinophils Relative: 3 %
HCT: 42.1 % (ref 36.0–46.0)
Hemoglobin: 13.4 g/dL (ref 12.0–15.0)
Immature Granulocytes: 0 %
LYMPHS PCT: 23 %
Lymphs Abs: 1.9 10*3/uL (ref 0.7–4.0)
MCH: 27.5 pg (ref 26.0–34.0)
MCHC: 31.8 g/dL (ref 30.0–36.0)
MCV: 86.4 fL (ref 80.0–100.0)
MONOS PCT: 13 %
Monocytes Absolute: 1.1 10*3/uL — ABNORMAL HIGH (ref 0.1–1.0)
NEUTROS PCT: 60 %
Neutro Abs: 5.1 10*3/uL (ref 1.7–7.7)
PLATELETS: 273 10*3/uL (ref 150–400)
RBC: 4.87 MIL/uL (ref 3.87–5.11)
RDW: 13.1 % (ref 11.5–15.5)
WBC: 8.3 10*3/uL (ref 4.0–10.5)
nRBC: 0 % (ref 0.0–0.2)

## 2017-11-22 LAB — BASIC METABOLIC PANEL
Anion gap: 8 (ref 5–15)
BUN: 10 mg/dL (ref 6–20)
CALCIUM: 9.4 mg/dL (ref 8.9–10.3)
CHLORIDE: 102 mmol/L (ref 98–111)
CO2: 27 mmol/L (ref 22–32)
CREATININE: 0.86 mg/dL (ref 0.44–1.00)
GFR calc Af Amer: >60 mL/min (ref >60)
GFR calc non Af Amer: >60 mL/min (ref >60)
GLUCOSE: 112 mg/dL — AB (ref 70–99)
Potassium: 3.9 mmol/L (ref 3.5–5.1)
Sodium: 137 mmol/L (ref 135–145)

## 2017-11-22 MED ORDER — VANCOMYCIN HCL 500 MG IV SOLR
500.0000 mg | Freq: Two times a day (BID) | INTRAVENOUS | Status: DC
  Administered 2017-11-22 – 2017-11-24 (×5): 500 mg via INTRAVENOUS
  Filled 2017-11-22 (×6): qty 500

## 2017-11-22 MED ORDER — IBUPROFEN 400 MG PO TABS
400.0000 mg | ORAL_TABLET | Freq: Four times a day (QID) | ORAL | Status: DC | PRN
  Administered 2017-11-23 – 2017-11-24 (×2): 400 mg via ORAL
  Filled 2017-11-22 (×3): qty 1

## 2017-11-22 NOTE — Progress Notes (Signed)
Pharmacy Antibiotic Note  Amber [name] is a 27 y.o. female admitted on 11/20/2017 with cellulitis.  Pharmacy has been consulted for Vancomycin  dosing.  Plan: Vancomycin 500 mg iv Q 12 Follow up progress and Scr  Height: 5\' 1"  (154.9 cm) Weight: 102 lb 1.2 oz (46.3 kg) IBW/kg (Calculated) : 47.8  Temp (24hrs), Avg:98.4 F (36.9 C), Min:98.2 F (36.8 C), Max:98.5 F (36.9 C)  Recent Labs  Lab 11/17/17 1642 11/21/17 0016 11/22/17 0528  WBC 11.0* 12.6* 8.3  CREATININE 0.77 0.75 0.86    Estimated Creatinine Clearance: 71.8 mL/min (by C-G formula based on SCr of 0.86 mg/dL).    No Known Allergies   Thank you for allowing pharmacy to be a part of this patient's care.  Elwin Sleight 11/22/2017 2:49 PM

## 2017-11-22 NOTE — Progress Notes (Signed)
PROGRESS NOTE    Amber [name]  ZOX:[id number] DOB: 07-18-1990 DOA: 11/20/2017 PCP: Patient, No Pcp Per    Brief Narrative:   Amber [name] is a 27 y.o. female with medical history significant for polysubstance abuse, now presenting to the emergency department for evaluation of progressive pain, swelling, and redness involving the right cheek.  She reports developing a small red bump on her right cheek approximately a week ago that she describes as a pimple, reports squeezing it but unable to express much.  Over the ensuing days, there has been increasing redness, swelling, and pain at the site and there was spontaneous drainage a couple days ago.  She was seen in the emergency department for this on 11/17/2017, CT was ordered for further assessment, but the patient left the hospital prior to imaging being performed. She was back on 11/9 and admitted for IV antibiotics.  Maxillofacial CT reveals extensive soft tissue swelling with inflammatory stranding throughout the right face concerning for acute cellulitis.  Also noted on CT is a superimposed ill-defined hypodensity, likely representing a phlegmon or early abscess, but no post septal extension. She was started on iv antibiotics and monitored.    Assessment & Plan:   Principal Problem:   Facial cellulitis Active Problems:   Polysubstance abuse (HCC)   Facial cellulitis:  - the swelling and erythema on the right side of the face is better, but not resolved yet, she still has significant tenderness and would not allow me to change the dressing.  Recommend to change to IV vancomycin and continue with pain  Medications.  We are trying avoid IV narcotic pain medications due to her h/o of poly substance abuse.  Afebrile and wbc wnl.   Polysubstance abuse: No signs of withdrawal at this time .    DVT prophylaxis: scd's Code Status: full code.  Family Communication: none at bedside.  Disposition Plan: pending clinical improvement.     Consultants:   None.   Procedures: none.   Antimicrobials: clindamycin since admission.    Subjective: Pain and swelling has improved since admission, but has not resolved.   Objective: Vitals:   11/21/17 2043 11/21/17 2220 11/22/17 0542 11/22/17 1044  BP: (!) 142/107 (!) 132/97 128/88 (!) 134/100  Pulse: 100 99 83 98  Resp: 18  18 18   Temp: 98.3 F (36.8 C)  98.4 F (36.9 C) 98.2 F (36.8 C)  TempSrc: Oral   Oral  SpO2: 100%  100% 99%  Weight: 46.3 kg     Height:        Intake/Output Summary (Last 24 hours) at 11/22/2017 1127 Last data filed at 11/22/2017 0600 Gross per 24 hour  Intake 619.69 ml  Output 0 ml  Net 619.69 ml   Filed Weights   11/21/17 0000 11/21/17 0403 11/21/17 2043  Weight: 46.3 kg 46.3 kg 46.3 kg    Examination:  General exam: Appears calm and comfortable  , erythematous nodule on the right side of the face, with surrounding swelling and tenderness present.  Respiratory system: Clear to auscultation. Respiratory effort normal. Cardiovascular system: S1 & S2 heard, RRR. No JVD, murmurs,No pedal edema. Gastrointestinal system: Abdomen is nondistended, soft and nontender. No organomegaly or masses felt. Normal bowel sounds heard. Central nervous system: Alert and oriented. No focal neurological deficits. Extremities: Symmetric 5 x 5 power. Skin: No rashes, lesions or ulcers Psychiatry:Mood & affect appropriate.     Data Reviewed: I have personally reviewed following labs and imaging studies  CBC: Recent  Labs  Lab 11/17/17 1642 11/21/17 0016 11/22/17 0528  WBC 11.0* 12.6* 8.3  NEUTROABS 8.1* 8.8* 5.1  HGB 12.9 13.8 13.4  HCT 40.7 43.7 42.1  MCV 90.2 89.4 86.4  PLT 264 291 273   Basic Metabolic Panel: Recent Labs  Lab 11/17/17 1642 11/21/17 0016 11/22/17 0528  NA 140 136 137  K 3.9 3.6 3.9  CL 107 100 102  CO2 26 25 27   GLUCOSE 128* 118* 112*  BUN 11 11 10   CREATININE 0.77 0.75 0.86  CALCIUM 9.2 10.0 9.4    GFR: Estimated Creatinine Clearance: 71.8 mL/min (by C-G formula based on SCr of 0.86 mg/dL). Liver Function Tests: No results for input(s): AST, ALT, ALKPHOS, BILITOT, PROT, ALBUMIN in the last 168 hours. No results for input(s): LIPASE, AMYLASE in the last 168 hours. No results for input(s): AMMONIA in the last 168 hours. Coagulation Profile: No results for input(s): INR, PROTIME in the last 168 hours. Cardiac Enzymes: No results for input(s): CKTOTAL, CKMB, CKMBINDEX, TROPONINI in the last 168 hours. BNP (last 3 results) No results for input(s): PROBNP in the last 8760 hours. HbA1C: No results for input(s): HGBA1C in the last 72 hours. CBG: No results for input(s): GLUCAP in the last 168 hours. Lipid Profile: No results for input(s): CHOL, HDL, LDLCALC, TRIG, CHOLHDL, LDLDIRECT in the last 72 hours. Thyroid Function Tests: No results for input(s): TSH, T4TOTAL, FREET4, T3FREE, THYROIDAB in the last 72 hours. Anemia Panel: No results for input(s): VITAMINB12, FOLATE, FERRITIN, TIBC, IRON, RETICCTPCT in the last 72 hours. Sepsis Labs: No results for input(s): PROCALCITON, LATICACIDVEN in the last 168 hours.  Recent Results (from the past 240 hour(s))  MRSA PCR Screening     Status: Abnormal   Collection Time: 11/21/17  9:05 AM  Result Value Ref Range Status   MRSA by PCR POSITIVE (A) NEGATIVE Final    Comment:        The GeneXpert MRSA Assay (FDA approved for NASAL specimens only), is one component of a comprehensive MRSA colonization surveillance program. It is not intended to diagnose MRSA infection nor to guide or monitor treatment for MRSA infections. CRITICAL RESULT CALLED TO, READ BACK BY AND VERIFIED WITH: C. YAP RN, AT 1158 11/21/17 BY D. VANHOOK   Culture, blood (Routine X 2) w Reflex to ID Panel     Status: None (Preliminary result)   Collection Time: 11/21/17  1:59 PM  Result Value Ref Range Status   Specimen Description BLOOD RIGHT ANTECUBITAL  Final    Special Requests   Final    BOTTLES DRAWN AEROBIC ONLY Blood Culture adequate volume   Culture   Final    NO GROWTH < 24 HOURS Performed at [facility], [address], [place], [place] [address]    Report Status PENDING  Incomplete         Radiology Studies: Ct Maxillofacial W Contrast  Result Date: 11/21/2017 CLINICAL DATA:  Initial evaluation for acute right-sided facial swelling. EXAM: CT MAXILLOFACIAL WITH CONTRAST TECHNIQUE: Multidetector CT imaging of the maxillofacial structures was performed with intravenous contrast. Multiplanar CT image reconstructions were also generated. CONTRAST:  75mL OMNIPAQUE IOHEXOL 300 MG/ML  SOLN COMPARISON:  None. FINDINGS: Osseous: No acute osseous abnormality about the face. No acute abnormality about the dentition. Orbits: Globes and orbital soft tissues within normal limits. No evidence for postseptal or intraorbital cellulitis. Sinuses: Mild scattered mucosal thickening involving the ethmoidal air cells, sphenoid sinuses, and maxillary sinuses. Paranasal sinuses are otherwise clear.  Mastoid air cells and middle ear cavities are well pneumatized and free of fluid. Soft tissues: Extensive soft tissue swelling with inflammatory stranding seen involving the right face, concerning for acute infection/cellulitis. Changes predominantly centered at the pre maxillary soft tissues, but do extend superiorly to involve the right periorbital soft tissues. No postseptal or intraorbital extension identified at this time. Lateral extension into the right temporal region and right parotid space. Inferior extension to involve the right masticator and submandibular spaces. Complex ill-defined hypodensity measuring approximately 13 x 8 x 7 mm centered at the right pre maxillary soft tissues concerning for phlegmon and/or early abscess (series 3, image 48). Possible communicating sinus tract towards the overlying skin noted. No other discrete or drainable fluid  collections identified. Few scattered right-sided cervical lymph nodes measuring up to 7-8 mm noted, likely reactive. Limited intracranial: Unremarkable. IMPRESSION: Extensive soft tissue swelling with inflammatory stranding throughout the right face as above, concerning for acute cellulitis. Superimposed ill-defined hypodensity measuring approximately 13 x 8 x 7 mm at the level of the pre maxillary soft tissues concerning for phlegmon and/or early abscess. No other discrete or drainable fluid collections identified. No postseptal or intraorbital extension at this time. Electronically Signed   By: Rise Mu M.D.   On: 11/21/2017 02:24        Scheduled Meds: . Chlorhexidine Gluconate Cloth  6 each Topical Q0600  . folic acid  1 mg Oral Daily  . multivitamin with minerals  1 tablet Oral Daily  . mupirocin ointment  1 application Nasal BID  . sodium chloride flush  3 mL Intravenous Q12H  . thiamine  100 mg Oral Daily   Or  . thiamine  100 mg Intravenous Daily   Continuous Infusions: . sodium chloride 250 mL (11/21/17 0859)  . sodium chloride 250 mL (11/21/17 1551)  . clindamycin (CLEOCIN) IV 600 mg (11/22/17 0829)     LOS: 1 day    Time spent: 28 minutes.     Kathlen Mody, MD [facility] Pager [phone]  If 7PM-7AM, please contact night-coverage [email or web address] Password [email or web address] 11/22/2017, 11:27 AM

## 2017-11-23 NOTE — Progress Notes (Addendum)
PROGRESS NOTE    CRYSTALLEE [name]  ONG:[id number] DOB: [birth year] DOA: 11/20/2017 PCP: Patient, No Pcp Per    Brief Narrative:   Amber [name] is a 27 y.o. female with medical history significant for polysubstance abuse, now presenting to the emergency department for evaluation of progressive pain, swelling, and redness involving the right cheek.  She reports developing a small red bump on her right cheek approximately a week ago that she describes as a pimple, reports squeezing it but unable to express much.  Over the ensuing days, there has been increasing redness, swelling, and pain at the site and there was spontaneous drainage a couple days ago.  She was seen in the emergency department for this on 11/17/2017, CT was ordered for further assessment, but the patient left the hospital prior to imaging being performed. She was back on 11/9 and admitted for IV antibiotics.  Maxillofacial CT reveals extensive soft tissue swelling with inflammatory stranding throughout the right face concerning for acute cellulitis.  Also noted on CT is a superimposed ill-defined hypodensity, likely representing a phlegmon or early abscess, but no post septal extension. She was started on iv antibiotics and monitored.    Assessment & Plan:   Principal Problem:   Facial cellulitis Active Problems:   Polysubstance abuse (HCC)   Facial cellulitis:  - the swelling and erythema on the right side of the face is better, but not resolved yet, continues to have pain on the right cheek, overnight patient reports the facial swelling drained spontaneously.  ENT consulted to see if she needs I&D.  ENT recommended no further I&D required plan compress the right cheek lesion and expressed pus.  And as she is getting better transition to oral antibiotics in the next 24 to 48 hours and discharge the patient home Meanwhile continue with  IV vancomycin today and continue with pain medications.  We are trying avoid IV narcotic  pain medications due to her h/o of poly substance abuse.  Afebrile and wbc wnl.   Polysubstance abuse: No signs of withdrawal at this time .    DVT prophylaxis: scd's Code Status: full code.  Family Communication: none at bedside.  Disposition Plan: pending clinical improvement. Monitor on IV antibiotics for another 24 hours   Consultants:   ENT.   Procedures: none.   Antimicrobials: CLINDAMYCIN ON 11./9 changed to IV vancomycin 11/10.    Subjective: Swelling, erythema, tenderness has improved.  No fever or chills at this time.  Objective: Vitals:   11/22/17 1709 11/22/17 2026 11/23/17 0444 11/23/17 0910  BP: (!) 142/100 (!) 129/110 116/86 114/81  Pulse: (!) 111 (!) 120 (!) 103 (!) 102  Resp: 18 18 18 18   Temp: 98.1 F (36.7 C) 98.2 F (36.8 C) 98.4 F (36.9 C) 98.3 F (36.8 C)  TempSrc: Oral Oral  Oral  SpO2: 100% 94% 100% 100%  Weight:      Height:        Intake/Output Summary (Last 24 hours) at 11/23/2017 1844 Last data filed at 11/23/2017 1820 Gross per 24 hour  Intake 1742.07 ml  Output 0 ml  Net 1742.07 ml   Filed Weights   11/21/17 0000 11/21/17 0403 11/21/17 2043  Weight: 46.3 kg 46.3 kg 46.3 kg    Examination:  General exam: Appears calm and comfortable  , erythematous nodule on the right side of the face, with surrounding swelling and tenderness no more pus at the lesion Respiratory system: Clear to auscultation, no wheezing or rhonchi Cardiovascular  system: S1 & S2 heard, RRR.  No JVD or murmur Gastrointestinal system: Abdomen is, nontender, nondistended with good bowel sounds Central nervous system: Alert and oriented. Non focal.  Extremities: Symmetric 5 x 5 power. Skin: No rashes, lesions or ulcers Psychiatry:Mood & affect appropriate.     Data Reviewed: I have personally reviewed following labs and imaging studies  CBC: Recent Labs  Lab 11/17/17 1642 11/21/17 0016 11/22/17 0528  WBC 11.0* 12.6* 8.3  NEUTROABS 8.1* 8.8* 5.1    HGB 12.9 13.8 13.4  HCT 40.7 43.7 42.1  MCV 90.2 89.4 86.4  PLT 264 291 273   Basic Metabolic Panel: Recent Labs  Lab 11/17/17 1642 11/21/17 0016 11/22/17 0528  NA 140 136 137  K 3.9 3.6 3.9  CL 107 100 102  CO2 26 25 27   GLUCOSE 128* 118* 112*  BUN 11 11 10   CREATININE 0.77 0.75 0.86  CALCIUM 9.2 10.0 9.4   GFR: Estimated Creatinine Clearance: 71.8 mL/min (by C-G formula based on SCr of 0.86 mg/dL). Liver Function Tests: No results for input(s): AST, ALT, ALKPHOS, BILITOT, PROT, ALBUMIN in the last 168 hours. No results for input(s): LIPASE, AMYLASE in the last 168 hours. No results for input(s): AMMONIA in the last 168 hours. Coagulation Profile: No results for input(s): INR, PROTIME in the last 168 hours. Cardiac Enzymes: No results for input(s): CKTOTAL, CKMB, CKMBINDEX, TROPONINI in the last 168 hours. BNP (last 3 results) No results for input(s): PROBNP in the last 8760 hours. HbA1C: No results for input(s): HGBA1C in the last 72 hours. CBG: No results for input(s): GLUCAP in the last 168 hours. Lipid Profile: No results for input(s): CHOL, HDL, LDLCALC, TRIG, CHOLHDL, LDLDIRECT in the last 72 hours. Thyroid Function Tests: No results for input(s): TSH, T4TOTAL, FREET4, T3FREE, THYROIDAB in the last 72 hours. Anemia Panel: No results for input(s): VITAMINB12, FOLATE, FERRITIN, TIBC, IRON, RETICCTPCT in the last 72 hours. Sepsis Labs: No results for input(s): PROCALCITON, LATICACIDVEN in the last 168 hours.  Recent Results (from the past 240 hour(s))  MRSA PCR Screening     Status: Abnormal   Collection Time: 11/21/17  9:05 AM  Result Value Ref Range Status   MRSA by PCR POSITIVE (A) NEGATIVE Final    Comment:        The GeneXpert MRSA Assay (FDA approved for NASAL specimens only), is one component of a comprehensive MRSA colonization surveillance program. It is not intended to diagnose MRSA infection nor to guide or monitor treatment for MRSA  infections. CRITICAL RESULT CALLED TO, READ BACK BY AND VERIFIED WITH: C. YAP RN, AT 1158 11/21/17 BY D. VANHOOK   Culture, blood (Routine X 2) w Reflex to ID Panel     Status: None (Preliminary result)   Collection Time: 11/21/17  1:59 PM  Result Value Ref Range Status   Specimen Description BLOOD RIGHT ANTECUBITAL  Final   Special Requests   Final    BOTTLES DRAWN AEROBIC ONLY Blood Culture adequate volume   Culture   Final    NO GROWTH 2 DAYS Performed at [facility], [address], [place], [place] [address]    Report Status PENDING  Incomplete         Radiology Studies: No results found.      Scheduled Meds: . Chlorhexidine Gluconate Cloth  6 each Topical Q0600  . folic acid  1 mg Oral Daily  . multivitamin with minerals  1 tablet Oral Daily  . mupirocin ointment  1 application Nasal BID  . sodium chloride flush  3 mL Intravenous Q12H  . thiamine  100 mg Oral Daily   Or  . thiamine  100 mg Intravenous Daily   Continuous Infusions: . sodium chloride 250 mL (11/21/17 0859)  . sodium chloride 250 mL (11/21/17 1551)  . vancomycin 500 mg (11/23/17 1621)     LOS: 2 days    Time spent: 25 minutes.     Kathlen Mody, MD [facility] Pager [phone]  If 7PM-7AM, please contact night-coverage [email or web address] Password [facility] 11/23/2017, 6:44 PM

## 2017-11-23 NOTE — Consult Note (Signed)
Reason for Consult: Facial abscess Referring Physician: Hosie Poisson, MD  Amber [name] is an 27 y.o. female.  HPI: History of painful swelling of the right cheek area for about a week.  She had what she thought might of been a pimple at the beginning of all of this.  She has a history of MRSA infection of her finger about 8 or 9 years ago.  She is a chronic smoker.  The cheek infection spontaneously drained earlier today.  Past Medical History:  Diagnosis Date  . Kidney stones   . MRSA (methicillin resistant staph aureus) culture positive   . Ovarian cyst   . UTI (lower urinary tract infection)     Past Surgical History:  Procedure Laterality Date  . FINGER SURGERY      History reviewed. No pertinent family history.  Social History:  reports that she has quit smoking. Her smoking use included cigarettes. She has never used smokeless tobacco. She reports that she has current or past drug history. Drugs: Marijuana and IV. She reports that she does not drink alcohol.  Allergies: No Known Allergies  Medications: Reviewed  Results for orders placed or performed during the hospital encounter of 11/20/17 (from the past 48 hour(s))  CBC WITH DIFFERENTIAL     Status: Abnormal   Collection Time: 11/22/17  5:28 AM  Result Value Ref Range   WBC 8.3 4.0 - 10.5 K/uL   RBC 4.87 3.87 - 5.11 MIL/uL   Hemoglobin 13.4 12.0 - 15.0 g/dL   HCT 42.1 36.0 - 46.0 %   MCV 86.4 80.0 - 100.0 fL   MCH 27.5 26.0 - 34.0 pg   MCHC 31.8 30.0 - 36.0 g/dL   RDW 13.1 11.5 - 15.5 %   Platelets 273 150 - 400 K/uL   nRBC 0.0 0.0 - 0.2 %   Neutrophils Relative % 60 %   Neutro Abs 5.1 1.7 - 7.7 K/uL   Lymphocytes Relative 23 %   Lymphs Abs 1.9 0.7 - 4.0 K/uL   Monocytes Relative 13 %   Monocytes Absolute 1.1 (H) 0.1 - 1.0 K/uL   Eosinophils Relative 3 %   Eosinophils Absolute 0.2 0.0 - 0.5 K/uL   Basophils Relative 1 %   Basophils Absolute 0.0 0.0 - 0.1 K/uL   Immature Granulocytes 0 %   Abs  Immature Granulocytes 0.02 0.00 - 0.07 K/uL    Comment: Performed at [facility], [address], [place], [place] [address]  Basic metabolic panel     Status: Abnormal   Collection Time: 11/22/17  5:28 AM  Result Value Ref Range   Sodium 137 135 - 145 mmol/L   Potassium 3.9 3.5 - 5.1 mmol/L   Chloride 102 98 - 111 mmol/L   CO2 27 22 - 32 mmol/L   Glucose, Bld 112 (H) 70 - 99 mg/dL   BUN 10 6 - 20 mg/dL   Creatinine, Ser 0.86 0.44 - 1.00 mg/dL   Calcium 9.4 8.9 - 10.3 mg/dL   GFR calc non Af Amer >60 >60 mL/min   GFR calc Af Amer >60 >60 mL/min    Comment: (NOTE) The eGFR has been calculated using the CKD EPI equation. This calculation has not been validated in all clinical situations. eGFR's persistently <60 mL/min signify possible Chronic Kidney Disease.    Anion gap 8 5 - 15    Comment: Performed at [facility] [address] [place], [place] [address]    No results  found.  VOH:[id number] except as listed in admit H&P  Blood pressure 114/81, pulse (!) 102, temperature 98.3 F (36.8 C), temperature source Oral, resp. rate 18, height 5' 1" (1.549 m), weight 46.3 kg, last menstrual period 09/09/2017, SpO2 100 %.  PHYSICAL EXAM: Overall appearance:  Healthy appearing, in no distress Head:  Normocephalic, atraumatic. Face: About a 3 cm area of very slight erythema and mild induration on the right cheek with a central defect where the infection has drained.  There is no palpable abscess currently.  There is no surrounding skin reaction or induration either.  Facial nerve function is completely normal. Ears: External ears appear normal. Nose: External nose is healthy in appearance. Internal nasal exam free of any lesions or obstruction. Oral Cavity/Pharynx:  There are no mucosal lesions or masses identified. Larynx/Hypopharynx: Deferred Neuro:  No identifiable neurologic deficits. Neck: No palpable neck masses.  Studies Reviewed: none  Procedures:  none   Assessment/Plan: Probable MRSA infection of the right cheek, spontaneously drained.  By inspection and palpation there does not appear to be a drainable abscess currently.  Recommend continue on IV antibiotics, warm compresses, manual compression of the lesion several times daily which she can do herself.  I will check her again in a couple of days to see if she is getting better.  Amber [name] 11/23/2017, 5:40 PM

## 2017-11-23 NOTE — Progress Notes (Signed)
Pt stated she just squeezed out a lot of pus from abcess on face. Site looks good. Cleaned with saline and covered with gauze. Instructed pt to keep the site covered to reduce exposure to other potential bacteria. Will continue to monitor.   Larey Days, RN

## 2017-11-23 NOTE — Progress Notes (Signed)
Nutrition Brief Note  Patient identified on the Malnutrition Screening Tool (MST) Report  Wt Readings from Last 15 Encounters:  11/21/17 46.3 kg  11/17/17 46.7 kg  09/29/15 45.4 kg  04/13/15 49.9 kg  04/12/15 49.9 kg  09/07/14 49.9 kg  08/09/14 47.6 kg  12/26/10 45.8 kg    Body mass index is 19.29 kg/m. Pt reports UBW around 100 pounds. Current wt 102 pounds  Current diet order is Regular, pt reports good appetite, patient is consuming approximately 100% of meals at this time. Pt also with many snacks at bedside including cereal, chips, candy. Pt denies issues chewing or swallowing. Labs and medications reviewed.   No nutrition interventions warranted at this time. If nutrition issues arise, please consult RD.   Romelle Starcher MS, RD, LDN, CNSC [phone] Pager  [phone] Weekend/On-Call Pager

## 2017-11-24 DIAGNOSIS — L03211 Cellulitis of face: Principal | ICD-10-CM

## 2017-11-24 DIAGNOSIS — F1910 Other psychoactive substance abuse, uncomplicated: Secondary | ICD-10-CM

## 2017-11-24 MED ORDER — LORAZEPAM 1 MG PO TABS: 1.0000 mg | ORAL_TABLET | Freq: Once | ORAL | Status: DC

## 2017-11-24 NOTE — Progress Notes (Signed)
Patient ID: Amber [name], female   DOB: 02/19/1990, 27 y.o.   MRN: [id number]  Follow-up visit, doing better than yesterday.  Minimal swelling today.  No palpable abscess.  Continue medical therapy.  Switch to oral antibiotics over the next day or 2.

## 2017-11-24 NOTE — Progress Notes (Signed)
Night nurse notified that she found syringe in her clothing, so called security to check the belongings.  Security checked her belongings with patient's permission and didn't find any.  Placed pt on Tele monitoring for possible drug use.  Pt easily arousable but lethargic.  Will continue to monitor.

## 2017-11-24 NOTE — Progress Notes (Signed)
Patient called nurse to her room stating " I feel anxious and can I get anything for anxiety". This nurse gave patient her  ordered atarax. Patient informed nurse atarax does not work for her. Text paged NP Schorr and received an order for ativan. Patient did not want to take her ativan , however informed me she wants to leave the hospital. I Inquired from patient her reasons of leaving AMA. Patient stated to nurse she has a job she needs to keep and she feels much better. I informed patient the importance of her staying in the hospital to received IV antibiotics. Patient stated to nurse I want to leave and I feel much better.AMA form given to patient to sign, text paged NP. IV access removed. Patient belongings given to patient.

## 2017-11-24 NOTE — Progress Notes (Signed)
PROGRESS NOTE    Amber [name]  ZOX:[id number] DOB: [birth year] DOA: 11/20/2017 PCP: Patient, No Pcp Per    Brief Narrative:   Amber [name] is a 27 y.o. female with medical history significant for polysubstance abuse, now presenting to the emergency department for evaluation of progressive pain, swelling, and redness involving the right cheek.  She reports developing a small red bump on her right cheek approximately a week ago that she describes as a pimple, reports squeezing it but unable to express much.  Over the ensuing days, there has been increasing redness, swelling, and pain at the site and there was spontaneous drainage a couple days ago.  She was seen in the emergency department for this on 11/17/2017, CT was ordered for further assessment, but the patient left the hospital prior to imaging being performed. She was back on 11/9 and admitted for IV antibiotics.  Maxillofacial CT reveals extensive soft tissue swelling with inflammatory stranding throughout the right face concerning for acute cellulitis.  Also noted on CT is a superimposed ill-defined hypodensity, likely representing a phlegmon or early abscess, but no post septal extension. She was started on iv antibiotics and monitored.    Assessment & Plan:   Principal Problem:   Facial cellulitis Active Problems:   Polysubstance abuse (HCC)   Facial cellulitis:  - the swelling and erythema on the right side of the face is better, but not resolved yet, continues to have pain on the right cheek, overnight patient reports the facial swelling drained spontaneously.  ENT consulted to see if she needs I&D.  ENT recommended no further I&D required plan compress the right cheek lesion and expressed pus.  And as she is getting better transition to oral antibiotics in the next 24 to 48 hours and discharge the patient home Meanwhile continue with  IV vancomycin today and continue with pain medications.  We are trying avoid IV narcotic  pain medications due to her h/o of poly substance abuse.  Afebrile and wbc wnl.   Polysubstance abuse: No signs of withdrawal at this time .    DVT prophylaxis: scd's Code Status: full code.  Family Communication: none at bedside.  Disposition Plan: pending clinical improvement. Monitor on IV antibiotics for another 24 hours   Consultants:   ENT.   Procedures: none.   Antimicrobials: CLINDAMYCIN ON 11./9 changed to IV vancomycin 11/10.    Subjective: Swelling, erythema, tenderness has improved.  No fever or chills at this time.  Objective: Vitals:   11/23/17 0910 11/23/17 2130 11/24/17 0609 11/24/17 1002  BP: 114/81 130/88 119/80 120/85  Pulse: (!) 102 (!) 121 (!) 120 (!) 105  Resp: 18 20 20 18   Temp: 98.3 F (36.8 C) 98.2 F (36.8 C) 98.4 F (36.9 C) 97.6 F (36.4 C)  TempSrc: Oral Oral Oral Oral  SpO2: 100% 99% 99% 100%  Weight:      Height:        Intake/Output Summary (Last 24 hours) at 11/24/2017 1408 Last data filed at 11/24/2017 1300 Gross per 24 hour  Intake 1074 ml  Output -  Net 1074 ml   Filed Weights   11/21/17 0000 11/21/17 0403 11/21/17 2043  Weight: 46.3 kg 46.3 kg 46.3 kg    Examination:  General exam: Appears calm and comfortable  , erythematous nodule on the right side of the face, with surrounding swelling and tenderness no more pus at the lesion Respiratory system: Clear to auscultation, no wheezing or rhonchi Cardiovascular system: S1 &  S2 heard, RRR.  No JVD or murmur Gastrointestinal system: Abdomen is, nontender, nondistended with good bowel sounds Central nervous system: Alert and oriented. Non focal.  Extremities: Symmetric 5 x 5 power. Skin: No rashes, lesions or ulcers Psychiatry:Mood & affect appropriate.     Data Reviewed: I have personally reviewed following labs and imaging studies  CBC: Recent Labs  Lab 11/17/17 1642 11/21/17 0016 11/22/17 0528  WBC 11.0* 12.6* 8.3  NEUTROABS 8.1* 8.8* 5.1  HGB 12.9 13.8  13.4  HCT 40.7 43.7 42.1  MCV 90.2 89.4 86.4  PLT 264 291 273   Basic Metabolic Panel: Recent Labs  Lab 11/17/17 1642 11/21/17 0016 11/22/17 0528  NA 140 136 137  K 3.9 3.6 3.9  CL 107 100 102  CO2 26 25 27   GLUCOSE 128* 118* 112*  BUN 11 11 10   CREATININE 0.77 0.75 0.86  CALCIUM 9.2 10.0 9.4   GFR: Estimated Creatinine Clearance: 71.8 mL/min (by C-G formula based on SCr of 0.86 mg/dL). Liver Function Tests: No results for input(s): AST, ALT, ALKPHOS, BILITOT, PROT, ALBUMIN in the last 168 hours. No results for input(s): LIPASE, AMYLASE in the last 168 hours. No results for input(s): AMMONIA in the last 168 hours. Coagulation Profile: No results for input(s): INR, PROTIME in the last 168 hours. Cardiac Enzymes: No results for input(s): CKTOTAL, CKMB, CKMBINDEX, TROPONINI in the last 168 hours. BNP (last 3 results) No results for input(s): PROBNP in the last 8760 hours. HbA1C: No results for input(s): HGBA1C in the last 72 hours. CBG: No results for input(s): GLUCAP in the last 168 hours. Lipid Profile: No results for input(s): CHOL, HDL, LDLCALC, TRIG, CHOLHDL, LDLDIRECT in the last 72 hours. Thyroid Function Tests: No results for input(s): TSH, T4TOTAL, FREET4, T3FREE, THYROIDAB in the last 72 hours. Anemia Panel: No results for input(s): VITAMINB12, FOLATE, FERRITIN, TIBC, IRON, RETICCTPCT in the last 72 hours. Sepsis Labs: No results for input(s): PROCALCITON, LATICACIDVEN in the last 168 hours.  Recent Results (from the past 240 hour(s))  MRSA PCR Screening     Status: Abnormal   Collection Time: 11/21/17  9:05 AM  Result Value Ref Range Status   MRSA by PCR POSITIVE (A) NEGATIVE Final    Comment:        The GeneXpert MRSA Assay (FDA approved for NASAL specimens only), is one component of a comprehensive MRSA colonization surveillance program. It is not intended to diagnose MRSA infection nor to guide or monitor treatment for MRSA infections. CRITICAL  RESULT CALLED TO, READ BACK BY AND VERIFIED WITH: C. YAP RN, AT 1158 11/21/17 BY D. VANHOOK   Culture, blood (Routine X 2) w Reflex to ID Panel     Status: None (Preliminary result)   Collection Time: 11/21/17  1:59 PM  Result Value Ref Range Status   Specimen Description BLOOD RIGHT ANTECUBITAL  Final   Special Requests   Final    BOTTLES DRAWN AEROBIC ONLY Blood Culture adequate volume   Culture   Final    NO GROWTH 3 DAYS Performed at [facility], [address], [place], [place] [address]    Report Status PENDING  Incomplete         Radiology Studies: No results found.      Scheduled Meds: . Chlorhexidine Gluconate Cloth  6 each Topical Q0600  . folic acid  1 mg Oral Daily  . multivitamin with minerals  1 tablet Oral Daily  . mupirocin ointment  1 application Nasal  BID  . sodium chloride flush  3 mL Intravenous Q12H  . thiamine  100 mg Oral Daily   Or  . thiamine  100 mg Intravenous Daily   Continuous Infusions: . sodium chloride 250 mL (11/21/17 0859)  . sodium chloride 250 mL (11/21/17 1551)  . vancomycin 500 mg (11/24/17 0442)     LOS: 3 days    Time spent: 25 minutes.     Kathlen Mody, MD [facility] Pager [phone]  If 7PM-7AM, please contact night-coverage [email or web address] Password [facility] 11/24/2017, 2:08 PM

## 2017-11-26 LAB — CULTURE, BLOOD (ROUTINE X 2)
Culture: NO GROWTH
Special Requests: ADEQUATE

## 2017-12-02 NOTE — Discharge Summary (Signed)
Physician Discharge Summary  KATJA BLUE ZOX:[id number] DOB: 1990/05/15 DOA: 11/20/2017  PCP: Patient, No Pcp Per  Admit date: 11/20/2017 Discharge date: 11/24/2017  Admitted From:[facility]  Disposition:  Left AMA.   Brief/Interim Summary: Amber Seith Robinsonis a 27 y.o.femalewith medical history significant forpolysubstance abuse, now presenting to the emergency department for evaluation of progressive pain, swelling, and redness involving the right cheek. She reports developing a small red bump on her right cheek approximately a week ago that she describes as a pimple, reports squeezing it but unable to express much. Over the ensuing days, there has been increasing redness, swelling, and pain at the site and there was spontaneous drainage a couple days ago. She was seen in the emergency department for this on 11/17/2017, CT was ordered for further assessment, but the patient left the hospital prior to imaging being performed. She was back on 11/9 and admitted for IV antibiotics. Maxillofacial CT reveals extensive soft tissue swelling with inflammatory stranding throughout the right face concerning for acute cellulitis. Also noted on CT is a superimposed ill-defined hypodensity, likely representing a phlegmon or early abscess, but no post septal extension. She was started on iv antibiotics and monitored. but on the night of 11/12, she left AMA without antibiotics.   Discharge Diagnoses:  Principal Problem:   Facial cellulitis Active Problems:   Polysubstance abuse (HCC)  Facial cellulitis:  - the swelling and erythema on the right side of the face is better, but not resolved yet, continues to have pain on the right cheek, overnight patient reports the facial swelling drained spontaneously.  ENT consulted to see if she needs I&D.  ENT recommended no further I&D required plan compress the right cheek lesion and expressed pus.  And as she is getting better transition to oral antibiotics in the  next 24 to 48 hours and plan to  discharge the patient home Meanwhile continue with  IV vancomycin today and continue with pain medications.  We are trying avoid IV narcotic pain medications due to her h/o of poly substance abuse.  Afebrile and wbc wnl.  But pt left AMA without any antibiotics.   Polysubstance abuse: No signs of withdrawal at this time  Discharge Instructions   Allergies as of 11/24/2017   No Known Allergies     Medication List    ASK your doctor about these medications   acetaminophen 500 MG tablet Commonly known as:  TYLENOL Take 500-1,000 mg by mouth every 6 (six) hours as needed for mild pain.   ibuprofen 200 MG tablet Commonly known as:  ADVIL,MOTRIN Take 200-800 mg by mouth every 6 (six) hours as needed for moderate pain.       No Known Allergies  Consultations:  ENT    Procedures/Studies: Ct Maxillofacial W Contrast  Result Date: 11/21/2017 CLINICAL DATA:  Initial evaluation for acute right-sided facial swelling. EXAM: CT MAXILLOFACIAL WITH CONTRAST TECHNIQUE: Multidetector CT imaging of the maxillofacial structures was performed with intravenous contrast. Multiplanar CT image reconstructions were also generated. CONTRAST:  75mL OMNIPAQUE IOHEXOL 300 MG/ML  SOLN COMPARISON:  None. FINDINGS: Osseous: No acute osseous abnormality about the face. No acute abnormality about the dentition. Orbits: Globes and orbital soft tissues within normal limits. No evidence for postseptal or intraorbital cellulitis. Sinuses: Mild scattered mucosal thickening involving the ethmoidal air cells, sphenoid sinuses, and maxillary sinuses. Paranasal sinuses are otherwise clear. Mastoid air cells and middle ear cavities are well pneumatized and free of fluid. Soft tissues: Extensive soft tissue swelling with inflammatory  stranding seen involving the right face, concerning for acute infection/cellulitis. Changes predominantly centered at the pre maxillary soft tissues, but do  extend superiorly to involve the right periorbital soft tissues. No postseptal or intraorbital extension identified at this time. Lateral extension into the right temporal region and right parotid space. Inferior extension to involve the right masticator and submandibular spaces. Complex ill-defined hypodensity measuring approximately 13 x 8 x 7 mm centered at the right pre maxillary soft tissues concerning for phlegmon and/or early abscess (series 3, image 48). Possible communicating sinus tract towards the overlying skin noted. No other discrete or drainable fluid collections identified. Few scattered right-sided cervical lymph nodes measuring up to 7-8 mm noted, likely reactive. Limited intracranial: Unremarkable. IMPRESSION: Extensive soft tissue swelling with inflammatory stranding throughout the right face as above, concerning for acute cellulitis. Superimposed ill-defined hypodensity measuring approximately 13 x 8 x 7 mm at the level of the pre maxillary soft tissues concerning for phlegmon and/or early abscess. No other discrete or drainable fluid collections identified. No postseptal or intraorbital extension at this time. Electronically Signed   By: Rise Mu M.D.   On: 11/21/2017 02:24      Subjective: Pt left AMA.   Discharge Exam: Vitals:   11/24/17 1002 11/24/17 1637  BP: 120/85 122/86  Pulse: (!) 105 92  Resp: 18 18  Temp: 97.6 F (36.4 C) 98.1 F (36.7 C)  SpO2: 100% 100%   Vitals:   11/23/17 2130 11/24/17 0609 11/24/17 1002 11/24/17 1637  BP: 130/88 119/80 120/85 122/86  Pulse: (!) 121 (!) 120 (!) 105 92  Resp: 20 20 18 18   Temp: 98.2 F (36.8 C) 98.4 F (36.9 C) 97.6 F (36.4 C) 98.1 F (36.7 C)  TempSrc: Oral Oral Oral Oral  SpO2: 99% 99% 100% 100%  Weight:      Height:            The results of significant diagnostics from this hospitalization (including imaging, microbiology, ancillary and laboratory) are listed below for reference.      Microbiology: No results found for this or any previous visit (from the past 240 hour(s)).   Labs: BNP (last 3 results) No results for input(s): BNP in the last 8760 hours. Basic Metabolic Panel: No results for input(s): NA, K, CL, CO2, GLUCOSE, BUN, CREATININE, CALCIUM, MG, PHOS in the last 168 hours. Liver Function Tests: No results for input(s): AST, ALT, ALKPHOS, BILITOT, PROT, ALBUMIN in the last 168 hours. No results for input(s): LIPASE, AMYLASE in the last 168 hours. No results for input(s): AMMONIA in the last 168 hours. CBC: No results for input(s): WBC, NEUTROABS, HGB, HCT, MCV, PLT in the last 168 hours. Cardiac Enzymes: No results for input(s): CKTOTAL, CKMB, CKMBINDEX, TROPONINI in the last 168 hours. BNP: Invalid input(s): POCBNP CBG: No results for input(s): GLUCAP in the last 168 hours. D-Dimer No results for input(s): DDIMER in the last 72 hours. Hgb A1c No results for input(s): HGBA1C in the last 72 hours. Lipid Profile No results for input(s): CHOL, HDL, LDLCALC, TRIG, CHOLHDL, LDLDIRECT in the last 72 hours. Thyroid function studies No results for input(s): TSH, T4TOTAL, T3FREE, THYROIDAB in the last 72 hours.  Invalid input(s): FREET3 Anemia work up No results for input(s): VITAMINB12, FOLATE, FERRITIN, TIBC, IRON, RETICCTPCT in the last 72 hours. Urinalysis    Component Value Date/Time   COLORURINE Yellow 01/27/2014 1811   COLORURINE YELLOW 10/14/2012 2010   APPEARANCEUR Turbid 01/27/2014 1811   LABSPEC 1.011 01/27/2014 1811  PHURINE 9.0 01/27/2014 1811   PHURINE 6.5 10/14/2012 2010   GLUCOSEU Negative 01/27/2014 1811   HGBUR Negative 01/27/2014 1811   HGBUR NEGATIVE 10/14/2012 2010   BILIRUBINUR Negative 01/27/2014 1811   KETONESUR Negative 01/27/2014 1811   KETONESUR NEGATIVE 10/14/2012 2010   PROTEINUR Negative 01/27/2014 1811   PROTEINUR NEGATIVE 10/14/2012 2010   UROBILINOGEN 0.2 10/14/2012 2010   NITRITE Negative 01/27/2014 1811    NITRITE NEGATIVE 10/14/2012 2010   LEUKOCYTESUR Negative 01/27/2014 1811   Sepsis Labs Invalid input(s): PROCALCITONIN,  WBC,  LACTICIDVEN Microbiology No results found for this or any previous visit (from the past 240 hour(s)).   Time coordinating discharge: 25 minutes  SIGNED:   Kathlen ModyVijaya Zaynah Chawla, MD  [facility] 12/02/2017, 6:30 PM Pager   If 7PM-7AM, please contact night-coverage [email or web address] Password [email or web address]

## 2018-01-18 ENCOUNTER — Other Ambulatory Visit: Payer: Self-pay

## 2018-01-18 ENCOUNTER — Emergency Department (HOSPITAL_COMMUNITY): Payer: Self-pay

## 2018-01-18 ENCOUNTER — Emergency Department (HOSPITAL_COMMUNITY)
Admission: EM | Admit: 2018-01-18 | Discharge: 2018-01-19 | Disposition: A | Discharge status: Home / Self Care | Payer: Self-pay | Attending: Emergency Medicine | Admitting: Emergency Medicine

## 2018-01-18 ENCOUNTER — Encounter (HOSPITAL_COMMUNITY): Payer: Self-pay | Admitting: Emergency Medicine

## 2018-01-18 DIAGNOSIS — Y999 Unspecified external cause status: Secondary | ICD-10-CM | POA: Insufficient documentation

## 2018-01-18 DIAGNOSIS — Z79899 Other long term (current) drug therapy: Secondary | ICD-10-CM | POA: Insufficient documentation

## 2018-01-18 DIAGNOSIS — S93401A Sprain of unspecified ligament of right ankle, initial encounter: Secondary | ICD-10-CM | POA: Insufficient documentation

## 2018-01-18 DIAGNOSIS — X501XXA Overexertion from prolonged static or awkward postures, initial encounter: Secondary | ICD-10-CM | POA: Insufficient documentation

## 2018-01-18 DIAGNOSIS — Z87891 Personal history of nicotine dependence: Secondary | ICD-10-CM | POA: Insufficient documentation

## 2018-01-18 DIAGNOSIS — Y939 Activity, unspecified: Secondary | ICD-10-CM | POA: Insufficient documentation

## 2018-01-18 DIAGNOSIS — Y929 Unspecified place or not applicable: Secondary | ICD-10-CM | POA: Insufficient documentation

## 2018-01-18 DIAGNOSIS — S93491A Sprain of other ligament of right ankle, initial encounter: Secondary | ICD-10-CM

## 2018-01-18 DIAGNOSIS — S0990XA Unspecified injury of head, initial encounter: Secondary | ICD-10-CM | POA: Insufficient documentation

## 2018-01-18 LAB — COMPREHENSIVE METABOLIC PANEL
ALBUMIN: 3.8 g/dL (ref 3.5–5.0)
ALK PHOS: 81 U/L (ref 38–126)
ALT: 55 U/L — ABNORMAL HIGH (ref 0–44)
ANION GAP: 10 (ref 5–15)
AST: 53 U/L — ABNORMAL HIGH (ref 15–41)
BILIRUBIN TOTAL: 1.3 mg/dL — AB (ref 0.3–1.2)
BUN: 15 mg/dL (ref 6–20)
CALCIUM: 9.3 mg/dL (ref 8.9–10.3)
CO2: 25 mmol/L (ref 22–32)
Chloride: 101 mmol/L (ref 98–111)
Creatinine, Ser: 0.68 mg/dL (ref 0.44–1.00)
GFR calc Af Amer: >60 mL/min (ref >60)
GFR calc non Af Amer: >60 mL/min (ref >60)
Glucose, Bld: 72 mg/dL (ref 70–99)
POTASSIUM: 4 mmol/L (ref 3.5–5.1)
SODIUM: 136 mmol/L (ref 135–145)
TOTAL PROTEIN: 7.3 g/dL (ref 6.5–8.1)

## 2018-01-18 LAB — CBC WITH DIFFERENTIAL/PLATELET
ABS IMMATURE GRANULOCYTES: 0.03 10*3/uL (ref 0.00–0.07)
BASOS ABS: 0 10*3/uL (ref 0.0–0.1)
Basophils Relative: 1 %
EOS ABS: 0.3 10*3/uL (ref 0.0–0.5)
Eosinophils Relative: 4 %
HEMATOCRIT: 37.7 % (ref 36.0–46.0)
Hemoglobin: 12.1 g/dL (ref 12.0–15.0)
IMMATURE GRANULOCYTES: 0 %
LYMPHS ABS: 2.1 10*3/uL (ref 0.7–4.0)
LYMPHS PCT: 23 %
MCH: 28.4 pg (ref 26.0–34.0)
MCHC: 32.1 g/dL (ref 30.0–36.0)
MCV: 88.5 fL (ref 80.0–100.0)
MONOS PCT: 16 %
Monocytes Absolute: 1.4 10*3/uL — ABNORMAL HIGH (ref 0.1–1.0)
NEUTROS ABS: 5 10*3/uL (ref 1.7–7.7)
NEUTROS PCT: 56 %
NRBC: 0 % (ref 0.0–0.2)
Platelets: 244 10*3/uL (ref 150–400)
RBC: 4.26 MIL/uL (ref 3.87–5.11)
RDW: 14.2 % (ref 11.5–15.5)
WBC: 8.8 10*3/uL (ref 4.0–10.5)

## 2018-01-18 LAB — I-STAT BETA HCG BLOOD, ED (MC, WL, AP ONLY): I-stat hCG, quantitative: <5 m[IU]/mL (ref <5)

## 2018-01-18 LAB — ETHANOL

## 2018-01-18 LAB — ACETAMINOPHEN LEVEL

## 2018-01-18 LAB — SALICYLATE LEVEL: Salicylate Lvl: <7 mg/dL (ref 2.8–30.0)
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
## 2018-01-18 MED ORDER — NALOXONE HCL 0.4 MG/ML IJ SOLN
0.4000 mg | Freq: Once | INTRAMUSCULAR | Status: AC
  Administered 2018-01-18: 0.4 mg via INTRAVENOUS
  Filled 2018-01-18: qty 1

## 2018-01-18 NOTE — Discharge Instructions (Signed)
You can take 1000 mg of Tylenol.  Do not exceed 4000 mg of Tylenol a day.  Follow the RICE (Rest, Ice, Compression, Elevation) protocol as directed.   Wear the splint for support and stabilization.  Use crutches as needed for pain.  Few days, you can start to bear weight on your ankle to see if it is improved.  Increase as tolerated.  Do not use any heroin, marijuana.  Return the emergency department for any worsening pain, numbness/weakness of the leg, chest pain, difficulty breathing or any other worsening concerning symptoms.
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
## 2018-01-19 NOTE — ED Notes (Signed)
Pt ambulated to the restroom on crutches with no difficulty

## 2018-06-24 ENCOUNTER — Other Ambulatory Visit: Payer: Self-pay

## 2018-06-24 ENCOUNTER — Emergency Department (HOSPITAL_COMMUNITY)
Admission: EM | Admit: 2018-06-24 | Discharge: 2018-06-24 | Disposition: A | Discharge status: Home / Self Care | Payer: Self-pay | Attending: Emergency Medicine | Admitting: Emergency Medicine

## 2018-06-24 DIAGNOSIS — L0201 Cutaneous abscess of face: Secondary | ICD-10-CM | POA: Insufficient documentation

## 2018-06-24 DIAGNOSIS — Z8614 Personal history of Methicillin resistant Staphylococcus aureus infection: Secondary | ICD-10-CM | POA: Insufficient documentation

## 2018-06-24 DIAGNOSIS — Z87891 Personal history of nicotine dependence: Secondary | ICD-10-CM | POA: Insufficient documentation

## 2018-06-24 DIAGNOSIS — Z79899 Other long term (current) drug therapy: Secondary | ICD-10-CM | POA: Insufficient documentation

## 2018-06-24 LAB — CBC WITH DIFFERENTIAL/PLATELET
Abs Immature Granulocytes: 0.04 10*3/uL (ref 0.00–0.07)
Basophils Absolute: 0 10*3/uL (ref 0.0–0.1)
Basophils Relative: 0 %
Eosinophils Absolute: 0.2 10*3/uL (ref 0.0–0.5)
Eosinophils Relative: 2 %
HCT: 37.1 % (ref 36.0–46.0)
Hemoglobin: 11.9 g/dL — ABNORMAL LOW (ref 12.0–15.0)
Immature Granulocytes: 0 %
Lymphocytes Relative: 20 %
Lymphs Abs: 1.9 10*3/uL (ref 0.7–4.0)
MCH: 27.3 pg (ref 26.0–34.0)
MCHC: 32.1 g/dL (ref 30.0–36.0)
MCV: 85.1 fL (ref 80.0–100.0)
Monocytes Absolute: 1 10*3/uL (ref 0.1–1.0)
Monocytes Relative: 11 %
Neutro Abs: 6.5 10*3/uL (ref 1.7–7.7)
Neutrophils Relative %: 67 %
Platelets: 223 10*3/uL (ref 150–400)
RBC: 4.36 MIL/uL (ref 3.87–5.11)
RDW: 13.4 % (ref 11.5–15.5)
WBC: 9.8 10*3/uL (ref 4.0–10.5)
nRBC: 0 % (ref 0.0–0.2)

## 2018-06-24 LAB — COMPREHENSIVE METABOLIC PANEL
ALT: 31 U/L (ref 0–44)
AST: 29 U/L (ref 15–41)
Albumin: 3.3 g/dL — ABNORMAL LOW (ref 3.5–5.0)
Alkaline Phosphatase: 87 U/L (ref 38–126)
Anion gap: 10 (ref 5–15)
BUN: 12 mg/dL (ref 6–20)
CO2: 24 mmol/L (ref 22–32)
Calcium: 9.3 mg/dL (ref 8.9–10.3)
Chloride: 99 mmol/L (ref 98–111)
Creatinine, Ser: 0.67 mg/dL (ref 0.44–1.00)
GFR calc Af Amer: >60 mL/min (ref >60)
GFR calc non Af Amer: >60 mL/min (ref >60)
Glucose, Bld: 95 mg/dL (ref 70–99)
Potassium: 3.8 mmol/L (ref 3.5–5.1)
Sodium: 133 mmol/L — ABNORMAL LOW (ref 135–145)
Total Bilirubin: 0.5 mg/dL (ref 0.3–1.2)
Total Protein: 7.4 g/dL (ref 6.5–8.1)

## 2018-06-24 LAB — I-STAT BETA HCG BLOOD, ED (MC, WL, AP ONLY): I-stat hCG, quantitative: <5 m[IU]/mL (ref <5)

## 2018-06-24 MED ORDER — DOXYCYCLINE HYCLATE 100 MG PO TABS
100.0000 mg | ORAL_TABLET | Freq: Once | ORAL | Status: AC
  Administered 2018-06-24: 08:00:00 100 mg via ORAL
  Filled 2018-06-24: qty 1

## 2018-06-24 MED ORDER — SODIUM CHLORIDE 0.9 % IV SOLN: 1000.0000 mL | INTRAVENOUS | Status: DC

## 2018-06-24 MED ORDER — SODIUM CHLORIDE 0.9 % IV BOLUS (SEPSIS)
1000.0000 mL | Freq: Once | INTRAVENOUS | Status: AC
  Administered 2018-06-24: 1000 mL via INTRAVENOUS

## 2018-06-24 MED ORDER — HYDROCODONE-ACETAMINOPHEN 5-325 MG PO TABS: 2.0000 | ORAL_TABLET | ORAL | 0 refills | Status: DC | PRN

## 2018-06-24 MED ORDER — SODIUM CHLORIDE 0.9 % IV SOLN
1.0000 g | Freq: Once | INTRAVENOUS | Status: AC
  Administered 2018-06-24: 1 g via INTRAVENOUS
  Filled 2018-06-24: qty 10

## 2018-06-24 MED ORDER — DOXYCYCLINE HYCLATE 100 MG PO CAPS: 100.0000 mg | ORAL_CAPSULE | Freq: Two times a day (BID) | ORAL | 0 refills | Status: DC

## 2018-06-24 MED ORDER — HYDROCODONE-ACETAMINOPHEN 5-325 MG PO TABS
1.0000 | ORAL_TABLET | Freq: Once | ORAL | Status: AC
  Administered 2018-06-24: 1 via ORAL
  Filled 2018-06-24: qty 1

## 2018-06-24 NOTE — Discharge Instructions (Signed)
Warm compresses 20 minutes every hour for the next 24 hours.  Antibiotics as directed.  Recheck at Urgent Care tomorrow if improving.  Return here for recheck if worsening

## 2018-06-24 NOTE — ED Provider Notes (Signed)
[facility] EMERGENCY DEPARTMENT Provider Note   CSN: [id number] Arrival date & time: 06/24/18  0431     History   Chief Complaint Chief Complaint  Patient presents with  . Abscess    HPI Amber [name] is a 28 y.o. female.     The history is provided by the patient. No language interpreter was used.  Abscess Location:  Face Facial abscess location:  Face Size:  3 Abscess quality: painful, redness and warmth   Red streaking: no   Pain details:    Severity:  Moderate   Timing:  Constant   Progression:  Worsening Chronicity:  New Relieved by:  Nothing Worsened by:  Nothing Ineffective treatments:  None tried Risk factors: no prior abscess   Pt reports she has an abscess on her face.   Past Medical History:  Diagnosis Date  . Kidney stones   . MRSA (methicillin resistant staph aureus) culture positive   . Ovarian cyst   . UTI (lower urinary tract infection)     Patient Active Problem List   Diagnosis Date Noted  . Facial cellulitis 11/21/2017  . Depression   . Polysubstance abuse ([place])   . Suicidal ideation     Past Surgical History:  Procedure Laterality Date  . FINGER SURGERY       OB History   No obstetric history on file.      Home Medications    Prior to Admission medications   Medication Sig Start Date End Date Taking? Authorizing Provider  acetaminophen (TYLENOL) 500 MG tablet Take 500-1,000 mg by mouth every 6 (six) hours as needed for mild pain.    [provider]  ibuprofen (ADVIL,MOTRIN) 200 MG tablet Take 200-800 mg by mouth every 6 (six) hours as needed for moderate pain.    [provider]    Family History No family history on file.  Social History Social History   Tobacco Use  . Smoking status: Former Smoker    Types: Cigarettes  . Smokeless tobacco: Never Used  Substance Use Topics  . Alcohol use: No  . Drug use: Yes    Types: Marijuana, IV    Comment: opiods- pain medication       Allergies   Patient has no known allergies.   Review of Systems Review of Systems  Skin: Positive for wound.  All other systems reviewed and are negative.    Physical Exam Updated Vital Signs BP 123/85   Pulse 96   Temp 98.8 F (37.1 C) (Oral)   Resp 14   Ht 5\' 1"  (1.549 m)   Wt 47.6 kg   SpO2 99%   BMI 19.84 kg/m   Physical Exam Vitals signs reviewed.  HENT:     Head: Normocephalic.     Comments: Swollen lower left chin,  Tender to palpation      Nose: Nose normal.  Cardiovascular:     Rate and Rhythm: Normal rate.     Pulses: Normal pulses.  Pulmonary:     Effort: Pulmonary effort is normal.  Musculoskeletal: Normal range of motion.  Skin:    General: Skin is warm.  Neurological:     General: No focal deficit present.     Mental Status: She is alert.  Psychiatric:        Mood and Affect: Mood normal.      ED Treatments / Results  Labs (all labs ordered are listed, but only abnormal results are displayed) Labs Reviewed  COMPREHENSIVE  METABOLIC PANEL - Abnormal; Notable for the following components:      Result Value   Sodium 133 (*)    Albumin 3.3 (*)    All other components within normal limits  CBC WITH DIFFERENTIAL/PLATELET - Abnormal; Notable for the following components:   Hemoglobin 11.9 (*)    All other components within normal limits  I-STAT BETA HCG BLOOD, ED ([facility], [facility], [facility] ONLY)    EKG    Radiology No results found.  Procedures Procedures (including critical care time)  Medications Ordered in ED Medications  sodium chloride 0.9 % bolus 1,000 mL (1,000 mLs Intravenous New Bag/Given 06/24/18 0752)    Followed by  0.9 %  sodium chloride infusion (has no administration in time range)  cefTRIAXone (ROCEPHIN) 1 g in sodium chloride 0.9 % 100 mL IVPB (1 g Intravenous New Bag/Given 06/24/18 0757)  doxycycline (VIBRA-TABS) tablet 100 mg (100 mg Oral Given 06/24/18 0757)     Initial Impression / Assessment and Plan / ED Course  I  have reviewed the triage vital signs and the nursing notes.  Pertinent labs & imaging results that were available during my care of the patient were reviewed by me and considered in my medical decision making (see chart for details).        MDM  Pt given Rocephin IV,  Doxycycline po.  Pt given rx for doxycycline Pt advised warm compresses. Recheck at Urgent care tomorrow, Return here if any problems.   Final Clinical Impressions(s) / ED Diagnoses   Final diagnoses:  Abscess of face    ED Discharge Orders         Ordered    doxycycline (VIBRAMYCIN) 100 MG capsule  2 times daily     06/24/18 0959    HYDROcodone-acetaminophen (NORCO/VICODIN) 5-325 MG tablet  Every 4 hours PRN     06/24/18 0959        An After Visit Summary was printed and given to the patient.    Elson AreasSofia,  K, [place] JerseyPA-C 06/24/18 0959    Lorre NickAllen, Anthony, MD 06/30/18 [phone]

## 2018-06-24 NOTE — ED Notes (Signed)
Pt verbalized understanding of discharge instructions and follow up. Given pain medication before discharge. Pt ambulatory to lobby with steady gait. IV removed bleeding controlled.

## 2018-06-24 NOTE — ED Triage Notes (Signed)
Pt c/o abscess on left, lower face x3 days.

## 2018-08-20 ENCOUNTER — Emergency Department (HOSPITAL_COMMUNITY)
Admission: EM | Admit: 2018-08-20 | Discharge: 2018-08-20 | Disposition: A | Discharge status: Home / Self Care | Payer: Self-pay | Attending: Emergency Medicine | Admitting: Emergency Medicine

## 2018-08-20 ENCOUNTER — Encounter (HOSPITAL_COMMUNITY): Payer: Self-pay

## 2018-08-20 ENCOUNTER — Other Ambulatory Visit: Payer: Self-pay

## 2018-08-20 DIAGNOSIS — Z79899 Other long term (current) drug therapy: Secondary | ICD-10-CM | POA: Insufficient documentation

## 2018-08-20 DIAGNOSIS — L02811 Cutaneous abscess of head [any part, except face]: Secondary | ICD-10-CM | POA: Insufficient documentation

## 2018-08-20 DIAGNOSIS — Z8614 Personal history of Methicillin resistant Staphylococcus aureus infection: Secondary | ICD-10-CM | POA: Insufficient documentation

## 2018-08-20 DIAGNOSIS — Z87891 Personal history of nicotine dependence: Secondary | ICD-10-CM | POA: Insufficient documentation

## 2018-08-20 DIAGNOSIS — L0201 Cutaneous abscess of face: Secondary | ICD-10-CM

## 2018-08-20 LAB — POC URINE PREG, ED: Preg Test, Ur: NEGATIVE

## 2018-08-20 MED ORDER — DOXYCYCLINE HYCLATE 100 MG PO CAPS: 100.0000 mg | ORAL_CAPSULE | Freq: Two times a day (BID) | ORAL | 0 refills | Status: DC

## 2018-08-20 MED ORDER — KETOROLAC TROMETHAMINE 30 MG/ML IJ SOLN
30.0000 mg | Freq: Once | INTRAMUSCULAR | Status: AC
  Administered 2018-08-20: 13:00:00 30 mg via INTRAVENOUS
  Filled 2018-08-20: qty 1

## 2018-08-20 MED ORDER — LIDOCAINE-EPINEPHRINE 2 %-1:100000 IJ SOLN
20.0000 mL | Freq: Once | INTRAMUSCULAR | Status: AC
  Administered 2018-08-20: 20 mL
  Filled 2018-08-20: qty 1

## 2018-08-20 MED ORDER — FAMOTIDINE 20 MG PO TABS
20.0000 mg | ORAL_TABLET | Freq: Once | ORAL | Status: AC
  Administered 2018-08-20: 20 mg via ORAL
  Filled 2018-08-20: qty 1

## 2018-08-20 MED ORDER — IBUPROFEN 600 MG PO TABS: 600.0000 mg | ORAL_TABLET | Freq: Four times a day (QID) | ORAL | 0 refills | Status: DC | PRN

## 2018-08-20 MED ORDER — SODIUM CHLORIDE 0.9 % IV SOLN
1.0000 g | Freq: Once | INTRAVENOUS | Status: AC
  Administered 2018-08-20: 1 g via INTRAVENOUS
  Filled 2018-08-20: qty 10

## 2018-08-20 NOTE — Discharge Instructions (Signed)
Apply warm moist compress to abscess several times daily to improve healing.  Take antibiotic as prescribed.  Return to the ER if you notice no improvement or if you have concerns.

## 2018-08-20 NOTE — ED Notes (Signed)
IV start attempted x 2, unsuccessful

## 2018-08-20 NOTE — ED Provider Notes (Signed)
[facility] DEPT Provider Note   CSN: [id number] Arrival date & time: 08/20/18  1007     History   Chief Complaint Chief Complaint  Patient presents with  . Recurrent Skin Infections    HPI Amber [name] is a 28 y.o. female.     The history is provided by the patient and medical records. No language interpreter was used.     28 year old female with history of MRSA skin infection presenting complaining of facial infection.  Patient report for the past 3 to 4 days she noticed increasing pain swelling and redness to her left temporal region similar to prior skin infection that she had in the past.  She thinks is related to MRSA infection.  She reports sharp throbbing pain to the affected area moderate in severity.  She denies any recent injury.  She denies any fever or chills no runny nose sneezing or coughing.  She is up-to-date with immunization.  Past Medical History:  Diagnosis Date  . Kidney stones   . MRSA (methicillin resistant staph aureus) culture positive   . Ovarian cyst   . UTI (lower urinary tract infection)     Patient Active Problem List   Diagnosis Date Noted  . Facial cellulitis 11/21/2017  . Depression   . Polysubstance abuse ([place])   . Suicidal ideation     Past Surgical History:  Procedure Laterality Date  . FINGER SURGERY       OB History   No obstetric history on file.      Home Medications    Prior to Admission medications   Medication Sig Start Date End Date Taking? Authorizing Provider  acetaminophen (TYLENOL) 500 MG tablet Take 500-1,000 mg by mouth every 6 (six) hours as needed for mild pain.    [provider]  doxycycline (VIBRAMYCIN) 100 MG capsule Take 1 capsule (100 mg total) by mouth 2 (two) times daily. 06/24/18   Fransico Meadow, PA-C  HYDROcodone-acetaminophen (NORCO/VICODIN) 5-325 MG tablet Take 2 tablets by mouth every 4 (four) hours as needed. 06/24/18   Fransico Meadow, PA-C   ibuprofen (ADVIL,MOTRIN) 200 MG tablet Take 200-800 mg by mouth every 6 (six) hours as needed for moderate pain.    [provider]    Family History History reviewed. No pertinent family history.  Social History Social History   Tobacco Use  . Smoking status: Former Smoker    Types: Cigarettes  . Smokeless tobacco: Never Used  Substance Use Topics  . Alcohol use: No  . Drug use: Yes    Types: Marijuana, IV    Comment: opiods- pain medication      Allergies   Patient has no known allergies.   Review of Systems Review of Systems  Constitutional: Negative for fever.  Skin: Positive for rash.     Physical Exam Updated Vital Signs BP 119/89 (BP Location: Right Arm)   Pulse 74   Temp 98.5 F (36.9 C) (Oral)   Resp 14   SpO2 100%   Physical Exam Vitals signs and nursing note reviewed.  Constitutional:      General: She is not in acute distress.    Appearance: She is well-developed.  HENT:     Head: Atraumatic.  Eyes:     Conjunctiva/sclera: Conjunctivae normal.  Neck:     Musculoskeletal: Neck supple.  Skin:    Comments: Face: Left temporal region there is a cutaneous area of induration and fluctuant with surrounding skin erythema and tenderness  to palpation consistent with an abscess.  No eye involvement.  Neurological:     Mental Status: She is alert.      ED Treatments / Results  Labs (all labs ordered are listed, but only abnormal results are displayed) Labs Reviewed  POC URINE PREG, ED    EKG None  Radiology No results found.  Procedures .Marland Kitchen.Incision and Drainage  Date/Time: 08/20/2018 11:48 AM Performed by: Fayrene Helperran, Mylea Roarty, PA-C Authorized by: Fayrene Helperran, Vahe Pienta, PA-C   Consent:    Consent obtained:  Verbal   Consent given by:  Patient   Risks discussed:  Bleeding, incomplete drainage, pain and damage to other organs   Alternatives discussed:  No treatment Universal protocol:    Procedure explained and questions answered to patient or  proxy's satisfaction: yes     Relevant documents present and verified: yes     Test results available and properly labeled: yes     Imaging studies available: yes     Required blood products, implants, devices, and special equipment available: yes     Site/side marked: yes     Immediately prior to procedure a time out was called: yes     Patient identity confirmed:  Verbally with patient Location:    Type:  Abscess   Size:  3cm   Location:  Head   Head location:  Face (Left temporal region) Pre-procedure details:    Skin preparation:  Betadine Anesthesia (see MAR for exact dosages):    Anesthesia method:  Local infiltration   Local anesthetic:  Lidocaine 1% WITH epi Procedure type:    Complexity:  Complex Procedure details:    Incision types:  Single straight   Incision depth:  Subcutaneous   Scalpel blade:  11   Wound management:  Probed and deloculated, irrigated with saline and extensive cleaning   Drainage:  Purulent   Drainage amount:  Scant   Packing materials:  None Post-procedure details:    Patient tolerance of procedure:  Tolerated with difficulty   (including critical care time)  Medications Ordered in ED Medications  lidocaine-EPINEPHrine (XYLOCAINE W/EPI) 2 %-1:100000 (with pres) injection 20 mL (20 mLs Infiltration Given by Other 08/20/18 1233)  cefTRIAXone (ROCEPHIN) 1 g in sodium chloride 0.9 % 100 mL IVPB (1 g Intravenous New Bag/Given 08/20/18 1226)  ketorolac (TORADOL) 30 MG/ML injection 30 mg (30 mg Intravenous Given 08/20/18 1247)  famotidine (PEPCID) tablet 20 mg (20 mg Oral Given 08/20/18 1247)     Initial Impression / Assessment and Plan / ED Course  I have reviewed the triage vital signs and the nursing notes.  Pertinent labs & imaging results that were available during my care of the patient were reviewed by me and considered in my medical decision making (see chart for details).        BP 115/81   Pulse 91   Temp 98.5 F (36.9 C) (Oral)   Resp  16   SpO2 98%    Final Clinical Impressions(s) / ED Diagnoses   Final diagnoses:  Cutaneous abscess of face    ED Discharge Orders         Ordered    doxycycline (VIBRAMYCIN) 100 MG capsule  2 times daily     08/20/18 1304    ibuprofen (ADVIL) 600 MG tablet  Every 6 hours PRN     08/20/18 1304         11:49 AM Patient with history of MRSA, recurrent skin infection presenting to ED for facial infection.  She  has an abscess/cellulitis involving her left temple region.  It is a cutaneous abscess amenable to drainage.  Successful I&D.  1:05 PM Pt d/c home with doxy.  preg test neg.  Recommend warm compress, abx, and give return precaution.    Fayrene Helperran, Deklynn Charlet, PA-C 08/20/18 1306    Raeford RazorKohut, Stephen, MD 08/21/18 [phone]

## 2018-08-20 NOTE — ED Triage Notes (Addendum)
Patient reports sever pain to her left temple. Patient believes she has MRSA or Staph infection at left temple. Red swollen bump present to left temple area.   [facility] about [facility] months ago for a different spot on her face.    A/Ox4 Ambulatory in triage.

## 2019-01-10 ENCOUNTER — Other Ambulatory Visit: Payer: Self-pay

## 2019-01-10 ENCOUNTER — Emergency Department (HOSPITAL_COMMUNITY)
Admission: EM | Admit: 2019-01-10 | Discharge: 2019-01-11 | Disposition: A | Discharge status: Home / Self Care | Payer: Self-pay | Attending: Emergency Medicine | Admitting: Emergency Medicine

## 2019-01-10 ENCOUNTER — Encounter (HOSPITAL_COMMUNITY): Payer: Self-pay | Admitting: Emergency Medicine

## 2019-01-10 DIAGNOSIS — R509 Fever, unspecified: Secondary | ICD-10-CM | POA: Insufficient documentation

## 2019-01-10 DIAGNOSIS — N3000 Acute cystitis without hematuria: Secondary | ICD-10-CM | POA: Insufficient documentation

## 2019-01-10 DIAGNOSIS — Z20822 Contact with and (suspected) exposure to covid-19: Secondary | ICD-10-CM

## 2019-01-10 DIAGNOSIS — Z87891 Personal history of nicotine dependence: Secondary | ICD-10-CM | POA: Insufficient documentation

## 2019-01-10 DIAGNOSIS — R112 Nausea with vomiting, unspecified: Secondary | ICD-10-CM | POA: Insufficient documentation

## 2019-01-10 DIAGNOSIS — Z20828 Contact with and (suspected) exposure to other viral communicable diseases: Secondary | ICD-10-CM | POA: Insufficient documentation

## 2019-01-10 LAB — COMPREHENSIVE METABOLIC PANEL
ALT: 22 U/L (ref 0–44)
AST: 26 U/L (ref 15–41)
Albumin: 2.8 g/dL — ABNORMAL LOW (ref 3.5–5.0)
Alkaline Phosphatase: 117 U/L (ref 38–126)
Anion gap: 12 (ref 5–15)
BUN: 20 mg/dL (ref 6–20)
CO2: 22 mmol/L (ref 22–32)
Calcium: 8.9 mg/dL (ref 8.9–10.3)
Chloride: 94 mmol/L — ABNORMAL LOW (ref 98–111)
Creatinine, Ser: 0.86 mg/dL (ref 0.44–1.00)
GFR calc Af Amer: >60 mL/min (ref >60)
GFR calc non Af Amer: >60 mL/min (ref >60)
Glucose, Bld: 185 mg/dL — ABNORMAL HIGH (ref 70–99)
Potassium: 3.8 mmol/L (ref 3.5–5.1)
Sodium: 128 mmol/L — ABNORMAL LOW (ref 135–145)
Total Bilirubin: 0.3 mg/dL (ref 0.3–1.2)
Total Protein: 8 g/dL (ref 6.5–8.1)

## 2019-01-10 LAB — CBC WITH DIFFERENTIAL/PLATELET
Abs Immature Granulocytes: 0.06 10*3/uL (ref 0.00–0.07)
Basophils Absolute: 0 10*3/uL (ref 0.0–0.1)
Basophils Relative: 0 %
Eosinophils Absolute: 0 10*3/uL (ref 0.0–0.5)
Eosinophils Relative: 0 %
HCT: 36.7 % (ref 36.0–46.0)
Hemoglobin: 12.1 g/dL (ref 12.0–15.0)
Immature Granulocytes: 1 %
Lymphocytes Relative: 9 %
Lymphs Abs: 1.1 10*3/uL (ref 0.7–4.0)
MCH: 27.2 pg (ref 26.0–34.0)
MCHC: 33 g/dL (ref 30.0–36.0)
MCV: 82.5 fL (ref 80.0–100.0)
Monocytes Absolute: 1.5 10*3/uL — ABNORMAL HIGH (ref 0.1–1.0)
Monocytes Relative: 13 %
Neutro Abs: 8.8 10*3/uL — ABNORMAL HIGH (ref 1.7–7.7)
Neutrophils Relative %: 77 %
Platelets: 280 10*3/uL (ref 150–400)
RBC: 4.45 MIL/uL (ref 3.87–5.11)
RDW: 14.6 % (ref 11.5–15.5)
WBC: 11.4 10*3/uL — ABNORMAL HIGH (ref 4.0–10.5)
nRBC: 0 % (ref 0.0–0.2)

## 2019-01-10 LAB — I-STAT BETA HCG BLOOD, ED (MC, WL, AP ONLY): I-stat hCG, quantitative: <5 m[IU]/mL (ref <5)

## 2019-01-10 NOTE — ED Triage Notes (Signed)
Pt c/o 8/10 generalized body ache, fever, chills, nausea and vomiting for the past few weeks, denies any change on taste or smell, con close contact with Covid pt.

## 2019-01-11 LAB — URINALYSIS, ROUTINE W REFLEX MICROSCOPIC
Bilirubin Urine: NEGATIVE
Glucose, UA: NEGATIVE mg/dL
Hgb urine dipstick: NEGATIVE
Ketones, ur: 5 mg/dL — AB
Nitrite: POSITIVE — AB
Protein, ur: 100 mg/dL — AB
Specific Gravity, Urine: 1.016 (ref 1.005–1.030)
WBC, UA: >50 WBC/hpf — ABNORMAL HIGH (ref 0–5)
pH: 6 (ref 5.0–8.0)

## 2019-01-11 LAB — SARS CORONAVIRUS 2 (TAT 6-24 HRS): SARS Coronavirus 2: NEGATIVE

## 2019-01-11 MED ORDER — SODIUM CHLORIDE 0.9 % IV BOLUS
1000.0000 mL | Freq: Once | INTRAVENOUS | Status: AC
  Administered 2019-01-11: 08:00:00 1000 mL via INTRAVENOUS

## 2019-01-11 MED ORDER — CEPHALEXIN 500 MG PO CAPS: 500.0000 mg | ORAL_CAPSULE | Freq: Four times a day (QID) | ORAL | 0 refills | Status: DC

## 2019-01-11 MED ORDER — ONDANSETRON HCL 4 MG/2ML IJ SOLN
4.0000 mg | Freq: Once | INTRAMUSCULAR | Status: AC
  Administered 2019-01-11: 4 mg via INTRAVENOUS
  Filled 2019-01-11: qty 2

## 2019-01-11 MED ORDER — ONDANSETRON 4 MG PO TBDP: 4.0000 mg | ORAL_TABLET | Freq: Three times a day (TID) | ORAL | 0 refills | Status: DC | PRN

## 2019-01-11 MED ORDER — DIPHENHYDRAMINE HCL 50 MG/ML IJ SOLN
12.5000 mg | Freq: Once | INTRAMUSCULAR | Status: AC
  Administered 2019-01-11: 12.5 mg via INTRAVENOUS
  Filled 2019-01-11: qty 1

## 2019-01-11 MED ORDER — SODIUM CHLORIDE 0.9 % IV SOLN
1.0000 g | Freq: Once | INTRAVENOUS | Status: AC
  Administered 2019-01-11: 1 g via INTRAVENOUS
  Filled 2019-01-11 (×2): qty 10

## 2019-01-11 MED ORDER — ACETAMINOPHEN 325 MG PO TABS
650.0000 mg | ORAL_TABLET | Freq: Once | ORAL | Status: AC
  Administered 2019-01-11: 650 mg via ORAL
  Filled 2019-01-11: qty 2

## 2019-01-11 NOTE — ED Notes (Signed)
No answer in lobby for vitals recheck 

## 2019-01-11 NOTE — ED Notes (Signed)
Gave pt water for fluid challenge 

## 2019-01-11 NOTE — ED Notes (Signed)
Patient verbalizes understanding of discharge instructions. Opportunity for questioning and answers were provided. Armband removed by staff, pt discharged from ED.  

## 2019-01-11 NOTE — ED Notes (Signed)
IV team nurse at pt bedside. 

## 2019-01-11 NOTE — ED Provider Notes (Signed)
[facility] EMERGENCY DEPARTMENT Provider Note   CSN: [id number] Arrival date & time: 01/10/19  2128     History Chief Complaint  Patient presents with  . Generalized Body Aches    [name] is a 28 y.o. female presents to the ER for evaluation of fever associated with nausea, vomiting, abdominal pain, body aches, headache, decreased appetite, chills, malaise for the last 3 days. She has been in the waiting room for almost 10 h and states halfway through waiting her symptoms have essentially resolved and she feels much better. She had a fever on arrival and was given Tylenol fever has resolved. Has been self treating at home with oral fluids and rest. She has not eaten in 3 days but has been keeping fluids down and finished a Gatorade bottle while in the ER. Currently living in a motel. Denies congestion, sore throat, loss of taste or smell, cough, diarrhea. Has mild current abdominal pain that she attributes to being hungry not eating. No dysuria, hematuria or urinary frequency. No sick contacts. No other COVID symptoms.    HPI     Past Medical History:  Diagnosis Date  . Kidney stones   . MRSA (methicillin resistant staph aureus) culture positive   . Ovarian cyst   . UTI (lower urinary tract infection)     Patient Active Problem List   Diagnosis Date Noted  . Abscess of face 08/20/2018  . Facial cellulitis 11/21/2017  . Depression   . Polysubstance abuse (HCC)   . Suicidal ideation     Past Surgical History:  Procedure Laterality Date  . FINGER SURGERY       OB History   No obstetric history on file.     No family history on file.  Social History   Tobacco Use  . Smoking status: Former Smoker    Types: Cigarettes  . Smokeless tobacco: Never Used  Substance Use Topics  . Alcohol use: No  . Drug use: Yes    Types: Marijuana, IV    Comment: opiods- pain medication     Home Medications Prior to Admission medications   Medication  Sig Start Date End Date Taking? Authorizing Provider  cephALEXin (KEFLEX) 500 MG capsule Take 1 capsule (500 mg total) by mouth 4 (four) times daily. 01/11/19   Liberty Handy, PA-C  doxycycline (VIBRAMYCIN) 100 MG capsule Take 1 capsule (100 mg total) by mouth 2 (two) times daily. 08/20/18   Fayrene Helper, PA-C  ibuprofen (ADVIL) 600 MG tablet Take 1 tablet (600 mg total) by mouth every 6 (six) hours as needed. 08/20/18   Fayrene Helper, PA-C  naproxen sodium (ALEVE) 220 MG tablet Take 440-660 mg by mouth 2 (two) times daily as needed (pain).    [provider]  ondansetron (ZOFRAN ODT) 4 MG disintegrating tablet Take 1 tablet (4 mg total) by mouth every 8 (eight) hours as needed for nausea or vomiting. 01/11/19   Liberty Handy, PA-C    Allergies    Patient has no known allergies.  Review of Systems   Review of Systems  Constitutional: Positive for appetite change, chills, fatigue and fever.  Gastrointestinal: Positive for abdominal pain, nausea and vomiting.  Musculoskeletal: Positive for myalgias.  Neurological: Positive for headaches.  All other systems reviewed and are negative.   Physical Exam Updated Vital Signs BP 103/63   Pulse 84   Temp 97.6 F (36.4 C) (Oral)   Resp (!) 23   Ht 5\' 1"  (1.549  m)   Wt 49.9 kg   SpO2 98%   BMI 20.78 kg/m   Physical Exam Vitals and nursing note reviewed.  Constitutional:      Appearance: She is well-developed.     Comments: Non toxic in NAD  HENT:     Head: Normocephalic and atraumatic.     Nose: Nose normal.     Mouth/Throat:     Comments: MMM Eyes:     Conjunctiva/sclera: Conjunctivae normal.  Cardiovascular:     Rate and Rhythm: Normal rate and regular rhythm.  Pulmonary:     Effort: Pulmonary effort is normal.     Breath sounds: Normal breath sounds.  Abdominal:     General: Bowel sounds are normal.     Palpations: Abdomen is soft.     Tenderness: There is no abdominal tenderness.     Comments: No G/R/R. No  suprapubic or CVA tenderness. Negative Murphy's and McBurney's. Active BS to lower quadrants.   Musculoskeletal:        General: Normal range of motion.     Cervical back: Normal range of motion.  Skin:    General: Skin is warm and dry.     Capillary Refill: Capillary refill takes less than 2 seconds.  Neurological:     Mental Status: She is alert.  Psychiatric:        Behavior: Behavior normal.     ED Results / Procedures / Treatments   Labs (all labs ordered are listed, but only abnormal results are displayed) Labs Reviewed  CBC WITH DIFFERENTIAL/PLATELET - Abnormal; Notable for the following components:      Result Value   WBC 11.4 (*)    Neutro Abs 8.8 (*)    Monocytes Absolute 1.5 (*)    All other components within normal limits  COMPREHENSIVE METABOLIC PANEL - Abnormal; Notable for the following components:   Sodium 128 (*)    Chloride 94 (*)    Glucose, Bld 185 (*)    Albumin 2.8 (*)    All other components within normal limits  URINALYSIS, ROUTINE W REFLEX MICROSCOPIC - Abnormal; Notable for the following components:   APPearance CLOUDY (*)    Ketones, ur 5 (*)    Protein, ur 100 (*)    Nitrite POSITIVE (*)    Leukocytes,Ua LARGE (*)    WBC, UA >50 (*)    Bacteria, UA MANY (*)    All other components within normal limits  SARS CORONAVIRUS 2 (TAT 6-24 HRS)  URINE CULTURE  I-STAT BETA HCG BLOOD, ED ([facility], [facility], [facility] ONLY)    EKG None  Radiology No results found.  Procedures Procedures (including critical care time)  Medications Ordered in ED Medications  acetaminophen (TYLENOL) tablet 650 mg (650 mg Oral Given 01/11/19 0153)  sodium chloride 0.9 % bolus 1,000 mL (0 mLs Intravenous Stopped 01/11/19 1102)  ondansetron (ZOFRAN) injection 4 mg (4 mg Intravenous Given 01/11/19 0818)  diphenhydrAMINE (BENADRYL) injection 12.5 mg (12.5 mg Intravenous Given 01/11/19 0900)  cefTRIAXone (ROCEPHIN) 1 g in sodium chloride 0.9 % 100 mL IVPB (1 g Intravenous New Bag/Given  01/11/19 1208)    ED Course  I have reviewed the triage vital signs and the nursing notes.  Pertinent labs & imaging results that were available during my care of the patient were reviewed by me and considered in my medical decision making (see chart for details).  Clinical Course as of Jan 10 1249  Tue Jan 11, 2019  1129 Nitrite(!): POSITIVE [CG]  1129 Leukocytes,Ua(!): LARGE [CG]  1129 WBC, UA(!): >50 [CG]  1129 Bacteria, UA(!): MANY [CG]  1246 WBC(!): 11.4 [CG]  1246 Sodium(!): 128 [CG]  1246 NEUT#(!): 8.8 [CG]    Clinical Course User Index [CG] Liberty HandyGibbons, Melva Faux J, PA-C   MDM Rules/Calculators/A&P                      Ddx includes viral process including COVID, influenza.  However no cough, sore throat, congestion, diarrhea.  Given no other localizing symptoms to point to infection, will add UA.   ER work up in triage personally reviewed as above.   Mild leukocytosis, hyponatremia and UTI. COVID is negative.    Initially tachycardic but suspect this is from dehydration, high fever.  She is non toxic appearing with otherwise normal vital signs and minimal elevation in WBC.  Does not appear clinically ill, septic.  Tachycardia resolved after fever resolution.   IVF, antiemetic, rocephin given here.  Tolerating PO.    Re-evaluated and pt feels much improved.    Will dc with keflex, supportive care.  She has no abdominal or flank/CVA tenderness.  I don't think further emergent work up or interventions like CT scan, admission necessary.  Return precautions given. She is in agreement and comfortable with POC.  Final Clinical Impression(s) / ED Diagnoses Final diagnoses:  Acute cystitis without hematuria  Suspected COVID-19 virus infection    Rx / DC Orders ED Discharge Orders         Ordered    cephALEXin (KEFLEX) 500 MG capsule  4 times daily     01/11/19 1220    ondansetron (ZOFRAN ODT) 4 MG disintegrating tablet  Every 8 hours PRN     01/11/19 1220            Jerrell MylarGibbons, Blessen Kimbrough J, PA-C 01/11/19 1250    Benjiman CorePickering, Nathan, MD 01/11/19 1552

## 2019-01-11 NOTE — Discharge Instructions (Addendum)
You were seen in the ER for fever, chills, nausea, vomiting, headaches  Work-up today showed you have a urinary tract infection. You were given one-time dose of antibiotic here but you need to continue antibiotics at home.  You were tested for COVID-19 since you had no other localizing symptoms to point to an infection. These results will be back in the next 24 hours, someone should call you for positive results but you can also check results on MyChart.  Take anabiotic as prescribed and until completed. You can alternate ibuprofen and acetaminophen as needed for associated fevers or aches. Zofran is used for nausea. Stay hydrated.  Return to the ER for persistent or worsening symptoms, persistent vomiting despite nausea medicine, fever greater than 100.4, back or flank pain or inability to urinate.

## 2019-01-13 LAB — URINE CULTURE: Culture: >=100000 — AB

## 2019-01-16 ENCOUNTER — Telehealth: Payer: Self-pay | Admitting: Emergency Medicine

## 2019-01-16 NOTE — Telephone Encounter (Signed)
Post ED Visit - Positive Culture Follow-up  Culture report reviewed by antimicrobial stewardship pharmacist: [facility] []  , Pharm.D. []  Enzo Bi, Pharm.D., BCPS AQ-ID []  , Pharm.D., BCPS []  Celedonio Miyamoto, Pharm.D., BCPS []  [place], Garvin Fila.D., BCPS, AAHIVP []  , Pharm.D., BCPS, AAHIVP []  Georgina Pillion, PharmD, BCPS [x]  , PharmD, BCPS []  [place], PharmD, BCPS []  [place], PharmD []  , PharmD, BCPS []  Estella Husk, PharmD  Pharmacy Team []  Lysle Pearl, PharmD []  , PharmD []  Phillips Climes, PharmD []  , Rph []  Agapito Games) , PharmD []  Verlan Friends, PharmD []  , PharmD []  Mervyn Gay, PharmD []  , PharmD []  Vinnie Level, PharmD []  Wonda [facility], PharmD []  , PharmD []  Len Childs, PharmD   Positive urine culture Treated with Cephalexin, organism sensitive to the same and no further patient follow-up is required at this time.  Gorge Almanza 01/16/2019, 11:24 AM

## 2019-07-19 ENCOUNTER — Emergency Department: Payer: Medicaid HMO

## 2019-07-19 ENCOUNTER — Inpatient Hospital Stay
Admission: EM | Admit: 2019-07-19 | Discharge: 2019-07-21 | DRG: 603 | Disposition: A | Discharge status: Home / Self Care | Payer: Medicaid HMO | Attending: Internal Medicine | Admitting: Internal Medicine

## 2019-07-19 DIAGNOSIS — L03116 Cellulitis of left lower limb: Principal | ICD-10-CM | POA: Diagnosis present

## 2019-07-19 DIAGNOSIS — F1110 Opioid abuse, uncomplicated: Secondary | ICD-10-CM | POA: Diagnosis present

## 2019-07-19 DIAGNOSIS — B1920 Unspecified viral hepatitis C without hepatic coma: Secondary | ICD-10-CM | POA: Diagnosis present

## 2019-07-19 DIAGNOSIS — L02416 Cutaneous abscess of left lower limb: Secondary | ICD-10-CM | POA: Diagnosis present

## 2019-07-19 DIAGNOSIS — M659 Synovitis and tenosynovitis, unspecified: Secondary | ICD-10-CM | POA: Diagnosis present

## 2019-07-19 DIAGNOSIS — F1910 Other psychoactive substance abuse, uncomplicated: Secondary | ICD-10-CM

## 2019-07-19 DIAGNOSIS — Z20822 Contact with and (suspected) exposure to covid-19: Secondary | ICD-10-CM | POA: Diagnosis present

## 2019-07-19 DIAGNOSIS — M65172 Other infective (teno)synovitis, left ankle and foot: Secondary | ICD-10-CM | POA: Diagnosis present

## 2019-07-19 DIAGNOSIS — F1510 Other stimulant abuse, uncomplicated: Secondary | ICD-10-CM | POA: Diagnosis present

## 2019-07-19 DIAGNOSIS — B353 Tinea pedis: Secondary | ICD-10-CM | POA: Diagnosis present

## 2019-07-19 DIAGNOSIS — F1990 Other psychoactive substance use, unspecified, uncomplicated: Secondary | ICD-10-CM | POA: Diagnosis present

## 2019-07-19 DIAGNOSIS — F17210 Nicotine dependence, cigarettes, uncomplicated: Secondary | ICD-10-CM | POA: Diagnosis present

## 2019-07-19 LAB — CBC AND DIFFERENTIAL
Absolute NRBC: 0 10*3/uL (ref 0.00–0.00)
Basophils Absolute Automated: 0.04 10*3/uL (ref 0.00–0.08)
Basophils Automated: 0.5 %
Eosinophils Absolute Automated: 0.23 10*3/uL (ref 0.00–0.44)
Eosinophils Automated: 3.1 %
Hematocrit: 40.4 % (ref 34.7–43.7)
Hgb: 12.7 g/dL (ref 11.4–14.8)
Immature Granulocytes Absolute: 0.01 10*3/uL (ref 0.00–0.07)
Immature Granulocytes: 0.1 %
Lymphocytes Absolute Automated: 1.69 10*3/uL (ref 0.42–3.22)
Lymphocytes Automated: 23.1 %
MCH: 27 pg (ref 25.1–33.5)
MCHC: 31.4 g/dL — ABNORMAL LOW (ref 31.5–35.8)
MCV: 85.8 fL (ref 78.0–96.0)
MPV: 11.4 fL (ref 8.9–12.5)
Monocytes Absolute Automated: 0.95 10*3/uL — ABNORMAL HIGH (ref 0.21–0.85)
Monocytes: 13 %
Neutrophils Absolute: 4.39 10*3/uL (ref 1.10–6.33)
Neutrophils: 60.2 %
Nucleated RBC: 0 /100 WBC (ref 0.0–0.0)
Platelets: 184 10*3/uL (ref 142–346)
RBC: 4.71 10*6/uL (ref 3.90–5.10)
RDW: 14 % (ref 11–15)
WBC: 7.31 10*3/uL (ref 3.10–9.50)

## 2019-07-19 LAB — COVID-19 (SARS-COV-2): SARS CoV 2 Overall Result: NEGATIVE

## 2019-07-19 LAB — COMPREHENSIVE METABOLIC PANEL
ALT: 55 U/L (ref 0–55)
AST (SGOT): 46 U/L — ABNORMAL HIGH (ref 5–34)
Albumin/Globulin Ratio: 0.8 — ABNORMAL LOW (ref 0.9–2.2)
Albumin: 3.7 g/dL (ref 3.5–5.0)
Alkaline Phosphatase: 107 U/L — ABNORMAL HIGH (ref 37–106)
Anion Gap: 10 (ref 5.0–15.0)
BUN: 10 mg/dL (ref 7.0–19.0)
Bilirubin, Total: 0.8 mg/dL (ref 0.2–1.2)
CO2: 22 mEq/L (ref 22–29)
Calcium: 9.2 mg/dL (ref 8.5–10.5)
Chloride: 104 mEq/L (ref 100–111)
Creatinine: 0.7 mg/dL (ref 0.6–1.0)
Globulin: 4.4 g/dL — ABNORMAL HIGH (ref 2.0–3.6)
Glucose: 84 mg/dL (ref 70–100)
Potassium: 4.3 mEq/L (ref 3.5–5.1)
Protein, Total: 8.1 g/dL (ref 6.0–8.3)
Sodium: 136 mEq/L (ref 136–145)

## 2019-07-19 LAB — HCG QUANTITATIVE: hCG, Quant.: <1.2

## 2019-07-19 LAB — GFR: EGFR: >60

## 2019-07-19 LAB — LACTIC ACID, PLASMA: Lactic Acid: 1.4 mmol/L (ref 0.2–2.0)

## 2019-07-19 LAB — HEMOLYSIS INDEX: Hemolysis Index: 23 — ABNORMAL HIGH (ref 0–18)

## 2019-07-19 MED ORDER — KETOROLAC TROMETHAMINE 30 MG/ML IJ SOLN
30.00 mg | Freq: Once | INTRAMUSCULAR | Status: DC
Start: 2019-07-19 — End: 2019-07-19

## 2019-07-19 MED ORDER — SODIUM CHLORIDE 0.9 % IV MBP
1.00 g | Freq: Three times a day (TID) | INTRAVENOUS | Status: DC
Start: 2019-07-20 — End: 2019-07-20
  Administered 2019-07-20: 02:00:00 1 g via INTRAVENOUS
  Filled 2019-07-19 (×4): qty 1000

## 2019-07-19 MED ORDER — GLUCAGON 1 MG IJ SOLR (WRAP)
1.00 mg | INTRAMUSCULAR | Status: DC | PRN
Start: 2019-07-19 — End: 2019-07-21

## 2019-07-19 MED ORDER — ONDANSETRON 4 MG PO TBDP
4.00 mg | ORAL_TABLET | Freq: Four times a day (QID) | ORAL | Status: DC | PRN
Start: 2019-07-19 — End: 2019-07-21

## 2019-07-19 MED ORDER — VANCOMYCIN PHARMACY TO DOSE PLACEHOLDER
INTRAVENOUS | Status: DC
Start: 2019-07-19 — End: 2019-07-21

## 2019-07-19 MED ORDER — GLUCOSE 40 % PO GEL
15.00 g | ORAL | Status: DC | PRN
Start: 2019-07-19 — End: 2019-07-21

## 2019-07-19 MED ORDER — SODIUM CHLORIDE 0.9 % IV SOLN
750.00 mg | Freq: Two times a day (BID) | INTRAVENOUS | Status: DC
Start: 2019-07-19 — End: 2019-07-21
  Administered 2019-07-19 – 2019-07-20 (×3): 750 mg via INTRAVENOUS
  Filled 2019-07-19 (×5): qty 750

## 2019-07-19 MED ORDER — ACETAMINOPHEN 650 MG RE SUPP
650.00 mg | Freq: Four times a day (QID) | RECTAL | Status: DC | PRN
Start: 2019-07-19 — End: 2019-07-21

## 2019-07-19 MED ORDER — ACETAMINOPHEN 325 MG PO TABS
650.0000 mg | ORAL_TABLET | Freq: Four times a day (QID) | ORAL | Status: DC | PRN
Start: 2019-07-19 — End: 2019-07-21

## 2019-07-19 MED ORDER — DEXTROSE 50 % IV SOLN
12.50 g | INTRAVENOUS | Status: DC | PRN
Start: 2019-07-19 — End: 2019-07-21

## 2019-07-19 MED ORDER — OXYCODONE HCL 5 MG PO TABS
5.0000 mg | ORAL_TABLET | Freq: Four times a day (QID) | ORAL | Status: DC | PRN
Start: 2019-07-19 — End: 2019-07-21
  Administered 2019-07-21: 5 mg via ORAL
  Filled 2019-07-19: qty 1

## 2019-07-19 MED ORDER — MORPHINE SULFATE 2 MG/ML IJ/IV SOLN (WRAP)
1.0000 mg | Status: DC | PRN
Start: 2019-07-19 — End: 2019-07-21

## 2019-07-19 MED ORDER — MELATONIN 3 MG PO TABS
3.0000 mg | ORAL_TABLET | Freq: Every evening | ORAL | Status: DC | PRN
Start: 2019-07-19 — End: 2019-07-21

## 2019-07-19 MED ORDER — DEXTROSE 5 % IV SOLN
2.00 g | Freq: Once | INTRAVENOUS | Status: AC
Start: 2019-07-19 — End: 2019-07-19
  Administered 2019-07-19: 19:00:00 2 g via INTRAVENOUS
  Filled 2019-07-19: qty 2000

## 2019-07-19 MED ORDER — ONDANSETRON HCL 4 MG/2ML IJ SOLN
4.00 mg | Freq: Four times a day (QID) | INTRAMUSCULAR | Status: DC | PRN
Start: 2019-07-19 — End: 2019-07-21

## 2019-07-19 MED ORDER — KETOROLAC TROMETHAMINE 30 MG/ML IJ SOLN
30.00 mg | Freq: Once | INTRAMUSCULAR | Status: AC
Start: 2019-07-19 — End: 2019-07-19
  Administered 2019-07-19: 18:00:00 30 mg via INTRAVENOUS
  Filled 2019-07-19: qty 1

## 2019-07-19 MED ORDER — NALOXONE HCL 0.4 MG/ML IJ SOLN (WRAP)
0.20 mg | INTRAMUSCULAR | Status: DC | PRN
Start: 2019-07-19 — End: 2019-07-21

## 2019-07-19 MED ORDER — ENOXAPARIN SODIUM 40 MG/0.4ML SC SOLN
40.00 mg | Freq: Every day | SUBCUTANEOUS | Status: DC
Start: 2019-07-20 — End: 2019-07-21
  Administered 2019-07-20 – 2019-07-21 (×2): 40 mg via SUBCUTANEOUS
  Filled 2019-07-19 (×2): qty 0.4

## 2019-07-19 NOTE — ED Notes (Signed)
Unable to insert Iv rt hand infiltrate blood drawn and blood culture drawn at this time.

## 2019-07-19 NOTE — ED Triage Notes (Signed)
Melissa Avila is a 29 y.o. female presetns to the ED for c/o L lower leg swelling that started yesterday morning. Pt. Denies any injury/ trauma or SOB  BP 121/77    Pulse (!) 111    Temp 98.5 F (36.9 C) (Oral)    Resp 15    Ht 5\' 1"  (1.549 m)    Wt 50.5 kg    LMP  (LMP Unknown)    SpO2 97%    BMI 21.05 kg/m

## 2019-07-19 NOTE — ED Notes (Signed)
[facility] EMERGENCY DEPARTMENT  ED NURSING NOTE FOR THE RECEIVING INPATIENT NURSE   ED NURSE Shalayne Leach   SPECTRALINK [phone]   ED CHARGE RN [phone]   ADMISSION INFORMATION   Melissa Avila is a 29 y.o. female admitted with a diagnosis of:    1. Cellulitis and abscess of left leg         Isolation: None   Allergies: Other   Holding Orders confirmed? Yes   Belongings Documented? Yes   Home medications sent to pharmacy confirmed? No   NURSING CARE   Patient Comes From:   Mental Status: Home Independent  alert, oriented and sleeping, hx IV heroin use    ADL: Independent with all ADLs   Ambulation: no difficulty   Pertinent Information  and Safety Concerns: Hx IV heroin use, no behavioral issues      COVID Test sent to lab? Yes   VITAL SIGNS   Time BP Temp Pulse Resp SpO2   2000 121/62 98.1 84 16 100   CT / NIH   CT Head ordered on this patient?  No   NIH/Dysphagia assessment done prior to admission? No   PERSONAL PROTECTIVE EQUIPMENT   Face Shield, Gloves, N95, Pension scheme manager and Surgical / [place] Cap   LAB RESULTS   Labs Reviewed   CBC AND DIFFERENTIAL - Abnormal; Notable for the following components:       Result Value    MCHC 31.4 (*)     Monocytes Absolute Automated 0.95 (*)     All other components within normal limits   COMPREHENSIVE METABOLIC PANEL - Abnormal; Notable for the following components:    AST (SGOT) 46 (*)     Alkaline Phosphatase 107 (*)     Globulin 4.4 (*)     Albumin/Globulin Ratio 0.8 (*)     All other components within normal limits   HEMOLYSIS INDEX - Abnormal; Notable for the following components:    Hemolysis Index 23 (*)     All other components within normal limits   COVID-19 (SARS-COV-2)    Narrative:     o Collect and clearly label specimen type:  o Upper respiratory specimen: One Nasopharyngeal Dry Swab NO  Transport Media.  o Hand deliver to laboratory ASAP  Indication for testing->Extended care facility admission to  semi private room  Screening   CULTURE BLOOD AEROBIC AND ANAEROBIC     Narrative:     1 BLUE+1 PURPLE   CULTURE BLOOD AEROBIC AND ANAEROBIC    Narrative:     1 BLUE+1 PURPLE   HCG QUANTITATIVE   GFR   LACTIC ACID, PLASMA   URINALYSIS, REFLEX TO MICROSCOPIC EXAM IF INDICATED   RAPID DRUG SCREEN, URINE          Ticket to Ride Printed: Yes

## 2019-07-19 NOTE — H&P (Signed)
[facility]      Patient: Melissa Avila  Date: 07/19/2019   DOB: 09/05/1990  Admission Date: 07/19/2019   MRN: [id number]  Attending: Marya Amsler         Cc:  1. Left leg swelling and pain     History Gathered From: Patient and EMS/ED.    HISTORY AND PHYSICAL     Melissa Avila is a 29 y.o. female with medical history of tobacco dependence, IV drug abuse, hepatitis C (never received treatment) who presented with left leg swelling and pain.      Patient reports that she is having left leg swelling/pain with redness and warmth since yesterday.  Patient admits to use IV heroin and amphetamine but denies injecting drugs in legs, states she uses her arms to inject drugs.  Patient denies fall, trauma, skin rashes, fever, chills, cough, shortness of breath, chest pain, palpitation, weakness/numbness in arms/legs, nausea, vomiting, change in urinary/bowel habit.  Patient also smokes 3-4 cigarettes daily, denies using alcohol.  Patient has history of hep C, never treated.     Past medical history:  Hepatitis C.    Past Surgical History:   Procedure Laterality Date    KNEE SURGERY Right        Prior to Admission medications    Not on File       Allergies   Allergen Reactions    Other      Bee sting       CODE STATUS: Full code.    PRIMARY CARE MD: Pcp, None, MD    Family history:  Patient denies family history of hypertension or hyperlipidemia.    Social History     Tobacco Use    Smoking status: Current Every Day Smoker     Types: Cigarettes    Smokeless tobacco: Never Used   Vaping Use    Vaping Use: Never used   Substance Use Topics    Alcohol use: Not Currently    Drug use: Yes     Comment: heroin injection/ meth       REVIEW OF SYSTEMS   Positive for: As in HPI.  Negative for: As in HPI.  All ROS completed and otherwise negative.    PHYSICAL EXAM     Vital Signs (most recent): BP 108/71    Pulse 87    Temp 98.5 F (36.9 C) (Oral)    Resp 16    Ht 1.549 m (5\' 1" )    Wt 50.5 kg (111 lb 6.4 oz)     LMP  (LMP Unknown)    SpO2 98%    BMI 21.05 kg/m   Constitutional: NAD. Patient speaks freely in full sentences.   HEENT: NC/AT, no scleral icterus or conjunctival pallor, no nasal discharge, MMM, oropharynx without erythema or exudate  Neck: Trachea midline, supple, no cervical or supraclavicular lymphadenopathy or masses.  Cardiovascular: RRR, normal S1 S2, no murmurs, gallops, palpable thrills.  Respiratory: Normal rate. No retractions or increased work of breathing. Clear to auscultation bilaterally.  Gastrointestinal: +BS, non-distended, soft, non-tender, no rebound or guarding.  Genitourinary: No suprapubic or costovertebral angle tenderness.  Musculoskeletal: ROM and motor strength grossly normal.   Skin exam: No skin lesions.  Neurologic: No gross motor or sensory deficits  Psychiatric: AAOx3, affect and mood appropriate. The patient is alert, interactive, appropriate.  Capillary refill: Normal.    Exam done by Marya Amsler, MD on 07/19/19 at 7:40 PM.    LABS & IMAGING  Recent Results (from the past 24 hour(s))   CBC and differential    Collection Time: 07/19/19  5:34 PM   Result Value Ref Range    WBC 7.31 3.1 - 9.5 x10 3/uL    Hgb 12.7 11.4 - 14.8 g/dL    Hematocrit 09.8 11.9 - 43.7 %    Platelets 184 142 - 346 x10 3/uL    RBC 4.71 3.90 - 5.10 x10 6/uL    MCV 85.8 78.0 - 96.0 fL    MCH 27.0 25.1 - 33.5 pg    MCHC 31.4 (L) 31 - 35 g/dL    RDW 14 14.7 - 82.9 %    MPV 11.4 8.9 - 12.5 fL    Neutrophils 60.2 None %    Lymphocytes Automated 23.1 None %    Monocytes 13.0 None %    Eosinophils Automated 3.1 None %    Basophils Automated 0.5 None %    Immature Granulocytes 0.1 None %    Nucleated RBC 0.0 0.0 - 0.0 /100 WBC    Neutrophils Absolute 4.39 1 - 6 x10 3/uL    Lymphocytes Absolute Automated 1.69 0.42 - 3.22 x10 3/uL    Monocytes Absolute Automated 0.95 (H) 0.21 - 0.85 x10 3/uL    Eosinophils Absolute Automated 0.23 0.00 - 0.44 x10 3/uL    Basophils Absolute Automated 0.04 0.00 - 0.08 x10 3/uL     Immature Granulocytes Absolute 0.01 0.00 - 0.07 x10 3/uL    Absolute NRBC 0.00 0.00 - 0.00 x10 3/uL   Comprehensive metabolic panel    Collection Time: 07/19/19  5:34 PM   Result Value Ref Range    Glucose 84 70 - 100 mg/dL    BUN 56.2 7 - 19 mg/dL    Creatinine 0.7 0.6 - 1.0 mg/dL    Sodium 130 865 - 784 mEq/L    Potassium 4.3 3.5 - 5.1 mEq/L    Chloride 104 100 - 111 mEq/L    CO2 22 22 - 29 mEq/L    Calcium 9.2 8.5 - 10.5 mg/dL    Protein, Total 8.1 6.0 - 8.3 g/dL    Albumin 3.7 3.5 - 5.0 g/dL    AST (SGOT) 46 (H) 5 - 34 U/L    ALT 55 0 - 55 U/L    Alkaline Phosphatase 107 (H) 37 - 106 U/L    Bilirubin, Total 0.8 0.2 - 1.2 mg/dL    Globulin 4.4 (H) 2.0 - 3.6 g/dL    Albumin/Globulin Ratio 0.8 (L) 0.9 - 2.2    Anion Gap 10.0 5 - 15   Beta HCG, Quant, Serum    Collection Time: 07/19/19  5:34 PM   Result Value Ref Range    hCG, Quant. <1.2 See below   Hemolysis index    Collection Time: 07/19/19  5:34 PM   Result Value Ref Range    Hemolysis Index 23 (H) 0 - 18   GFR    Collection Time: 07/19/19  5:34 PM   Result Value Ref Range    EGFR >60.0    COVID-19 (SARS-COV-2) ([facility])    Collection Time: 07/19/19  5:34 PM    Specimen: Nasopharynx; Nasopharyngeal Swab   Result Value Ref Range    Purpose of COVID testing Screening     SARS-CoV-2 Specimen Source Nasopharyngeal     SARS CoV 2 Overall Result Negative    Lactic Acid    Collection Time: 07/19/19  6:15 PM   Result Value Ref Range  Lactic Acid 1.4 0.2 - 2.0 mmol/L       MICROBIOLOGY:  Blood Culture: P  Urine Culture: NA  Antibiotics Started: Y    IMAGING:  Upon my review: As below    CARDIAC:  EKG Interpretation (upon my review):  NA    Markers:      EMERGENCY DEPARTMENT COURSE:  Orders Placed This Encounter   Procedures    Blood Culture Aerobic/Anaerobic #1    Blood Culture Aerobic/Anaerobic #2    COVID-19 (SARS-COV-2) ([facility])    [name] VenoDopp Low Extremity Left    Tibia Fibula Left AP and Lateral    Foot Left AP Lateral and Oblique    CBC and  differential    Comprehensive metabolic panel    UA, Reflex to Microscopic (pts 3 + yrs)    Beta HCG, Quant, Serum    Hemolysis index    GFR    Lactic Acid    Basic Metabolic Panel    CBC and differential    Diet regular    Vital signs    Pulse Oximetry    Progressive Mobility Protocol    Notify physician    NSG Communication: Glucose POCT order (PRN hypoglycemia)    I/O    Height    Weight    Skin assessment    Nursing communication: Adult Hypoglycemia Treatment Algorithm    Place sequential compression device    Maintain sequential compression device    Education: Activity    Education: Disease Process & Condition    Education: Pain Management    Education: Falls Risk    Education: Smoking Cessation    Telemetry 24 Hour Protocol    Full Code    ED Clerk Communication Order    ECG 12 lead    Saline lock IV    Saline lock IV    ADMIT TO OBSERVATION (OUTPATIENT WITH OBSERVATION SERVICES)    Admit to Observation       ASSESSMENT & PLAN     Melissa Avila is a 29 y.o. female with medical history of tobacco dependence, IV drug abuse, hepatitis C (never received treatment) admitted with left lower extremity cellulitis.      Patient Active Hospital Problem List: 07/19/19    # LLE cellulitis  # Lucent line through the medial first sesamoid per L foot XR  # IVDA  # Tachycardia; HR 111. No murmur.  Likely due to left lower extremity cellulitis.  # Tobacco dependence  # Hepatitis C (never received treatment)     -[name] LLE; Per report, Normal venous duplex of the left lower extremity.  No  evidence of deep venous thrombosis.  -XR L tibia/fibula; Per report,  No fracture or dislocation.  -XR L foot; Per report, Lucent line through the medial first sesamoid. This could  represent a bipartite sesamoid or fracture.     -Will proceed with EKG.  -Will proceed with MRI left foot.    -Blood culture sent by ED.  Patient received a dose of Ancef also, will continue but will give 1 dose of  vancomycin.  -Left leg elevation.  -Warm compression  -Pain management with Tylenol as needed, oxycodone as needed for moderate pain and IV morphine as needed for severe pain.  -Telemetry monitoring.  Patient has tachycardia, but no other signs and symptoms of endocarditis.  Holding echocardiogram.  -Will send U tox.  UA sent by ED.  -Smoking cessation education and substance abuse cessation education provided.  -AST is  46 and alk phos is 107.  Patient can follow her PCP as outpatient for further hep C management if needed.    Nutrition  Regular.    DVT/VTE Prophylaxis  SCD/Lovenox.     Anticipated medical stability for discharge: 1-2 days.    Service status/Reason for ongoing hospitalization: Observation/left lower extremity cellulitis.  Anticipated Discharge Needs: To be determined.    Signed,  Marya Amsler    Total time: , spent in patient's interview, patient examination, review and discussion of imaging, counseling, coordination of patient care, review and completion of medical records.    This note was generated by the Epic EMR system/ Dragon speech recognition and may contain inherent errors or omissions not intended by the user.

## 2019-07-19 NOTE — Plan of Care (Signed)
Problem: Moderate/High Fall Risk Score >5  Goal: Patient will remain free of falls  Outcome: Progressing  Flowsheets (Taken 07/19/2019 2344)  High (Greater than 13):   MOD-Remain with patient during toileting   HIGH-Apply yellow "Fall Risk" arm band   HIGH-Initiate use of floor mats as appropriate   MOD-Place Fall Risk level on whiteboard in room     Problem: Safety  Goal: Patient will be free from injury during hospitalization  Outcome: Progressing  Flowsheets (Taken 07/19/2019 2344)  Patient will be free from injury during hospitalization:   Assess patient's risk for falls and implement fall prevention plan of care per policy   Provide and maintain safe environment   Use appropriate transfer methods   Ensure appropriate safety devices are available at the bedside   Include patient/ family/ care giver in decisions related to safety   Hourly rounding  Goal: Patient will be free from infection during hospitalization  Outcome: Progressing  Flowsheets (Taken 07/19/2019 2344)  Free from Infection during hospitalization: Assess and monitor for signs and symptoms of infection     Problem: Pain  Goal: Pain at adequate level as identified by patient  Outcome: Progressing  Flowsheets (Taken 07/19/2019 2344)  Pain at adequate level as identified by patient:   Identify patient comfort function goal   Assess for risk of opioid induced respiratory depression, including snoring/sleep apnea. Alert healthcare team of risk factors identified.   Assess pain on admission, during daily assessment and/or before any "as needed" intervention(s)   Reassess pain within 30-60 minutes of any procedure/intervention, per Pain Assessment, Intervention, Reassessment (AIR) Cycle   Evaluate if patient comfort function goal is met   Evaluate patient's satisfaction with pain management progress   Offer non-pharmacological pain management interventions   Consult/collaborate with Pain Service     Problem: Compromised Tissue integrity  Goal: Damaged tissue is  healing and protected  Outcome: Progressing  Flowsheets (Taken 07/19/2019 2344)  Damaged tissue is healing and protected:   Monitor/assess Braden scale every shift   Provide wound care per wound care algorithm   Reposition patient every 2 hours and as needed unless able to reposition self   Increase activity as tolerated/progressive mobility   Relieve pressure to bony prominences for patients at moderate and high risk   Avoid shearing injuries   Keep intact skin clean and dry   Use bath wipes, not soap and water, for daily bathing   Encourage use of lotion/moisturizer on skin   Monitor patient's hygiene practices  Goal: Nutritional status is improving  Outcome: Progressing  Flowsheets (Taken 07/19/2019 2344)  Nutritional status is improving:   Assist patient with eating   Allow adequate time for meals   Encourage patient to take dietary supplement(s) as ordered   Collaborate with Clinical Nutritionist

## 2019-07-19 NOTE — ED Provider Notes (Signed)
EMERGENCY DEPARTMENT HISTORY AND PHYSICAL EXAM      Date: 07/19/2019  Patient Name: Melissa Avila    History of Presenting Illness     Chief Complaint   Patient presents with    Leg Swelling       History Provided By: pt    Chief Complaint: left lower leg redness  Onset: last night  Timing: worsening  Location: left lower leg/foot  Quality: red, warm and tender  Severity: moderate  Modifying Factors: none  Associated Symptoms: denies fevers    Additional History: Curry Dulski is a 29 y.o. female with a h/o ivda, heroin and methamphetamines. She says she last used 5 days ago. She injects in her arms and says she has never used her lower extremities. Last night she noticed some redness to her left shin, today it spread to her left foot. She denies fevers. She denies hormone use. She does not take any medicines and denies any medicine allergies. She does not drink.    PCP: Pcp, None, MD      No current facility-administered medications for this encounter.     Current Outpatient Medications   Medication Sig Dispense Refill    acetaminophen (TYLENOL) 325 MG tablet Take 2 tablets (650 mg total) by mouth every 6 (six) hours as needed for Pain      clotrimazole (LOTRIMIN) 1 % cream Apply topically 2 (two) times daily for 10 days 24 g 0    doxycycline (VIBRA-TABS) 100 MG tablet Take 1 tablet (100 mg total) by mouth 2 (two) times daily for 18 days 36 tablet 0    lactobacillus/streptococcus (RISAQUAD) Cap Take 1 capsule by mouth daily 30 capsule 0       Past History     Past Medical History:  History reviewed. No pertinent past medical history.    Past Surgical History:  Past Surgical History:   Procedure Laterality Date    KNEE SURGERY Right        Family History:  No family history on file.    Social History:  Social History     Tobacco Use    Smoking status: Current Every Day Smoker     Types: Cigarettes    Smokeless tobacco: Never Used   Haematologist Use: Never used   Substance Use Topics     Alcohol use: Not Currently    Drug use: Yes     Comment: heroin injection/ meth       Allergies:  Allergies   Allergen Reactions    Other      Bee sting       Review of Systems   Review of Systems   Constitutional: Negative for fever.   HENT: Negative for congestion.    Eyes: Negative for discharge and redness.   Respiratory: Negative for shortness of breath.    Cardiovascular: Negative for chest pain.   Gastrointestinal: Negative for abdominal pain.   Musculoskeletal: Negative for falls.   Skin: Positive for rash.   Neurological: Negative for focal weakness.   Endo/Heme/Allergies: Does not bruise/bleed easily.   Psychiatric/Behavioral: The patient is not nervous/anxious.        Physical Exam   BP 110/73    Pulse 94    Temp 98.6 F (37 C) (Oral)    Resp 17    Ht 5' 1.2" (1.554 m)    Wt 50.3 kg    LMP  (LMP Unknown)    SpO2 98%  BMI 20.97 kg/m   Physical Exam  Vitals reviewed.   Constitutional:       General: She is in acute distress.      Appearance: She is not toxic-appearing or diaphoretic.   HENT:      Head: Normocephalic and atraumatic.      Nose: No congestion.   Eyes:      Conjunctiva/sclera: Conjunctivae normal.   Cardiovascular:      Rate and Rhythm: Normal rate and regular rhythm.      Heart sounds: Normal heart sounds.   Pulmonary:      Effort: Pulmonary effort is normal.      Breath sounds: Normal breath sounds.   Abdominal:      General: Bowel sounds are normal.      Palpations: Abdomen is soft.      Tenderness: There is no abdominal tenderness.   Musculoskeletal:         General: Normal range of motion.      Cervical back: Normal range of motion and neck supple.   Skin:     Findings: Erythema present.      Comments: Cellulitis left lower leg on her her shin and ankle/foot    Neurological:      Mental Status: She is alert and oriented to person, place, and time.      GCS: GCS eye subscore is 4. GCS verbal subscore is 5. GCS motor subscore is 6.   Psychiatric:         Mood and Affect: Mood  normal.         Speech: Speech normal.         Behavior: Behavior normal.         Thought Content: Thought content normal.         Cognition and Memory: Cognition normal.           Diagnostic Study Results     Labs -     Results     Procedure Component Value Units Date/Time    Vancomycin, trough [[id number]]  (Abnormal) Collected: 07/21/19 0841    Specimen: Blood Updated: 07/21/19 0953     Vancomycin Trough 3.9 ug/mL      Vancomycin Time of Last Dose unknown     Vancomycin Date of Last Dose 07/20/2019    Narrative:      Please draw as timed, thank you!    Basic Metabolic Panel [[id number]] Collected: 07/21/19 0342    Specimen: Blood Updated: 07/21/19 0446     Glucose 85 mg/dL      BUN 9.0 mg/dL      Creatinine 0.6 mg/dL      Calcium 8.7 mg/dL      Sodium 562 mEq/L      Potassium 4.1 mEq/L      Chloride 106 mEq/L      CO2 23 mEq/L      Anion Gap 8.0    Hemolysis index [[id number]] Collected: 07/21/19 0342     Updated: 07/21/19 0446     Hemolysis Index 2    GFR [[id number]] Collected: 07/21/19 0342     Updated: 07/21/19 0446     EGFR >60.0    CBC and differential [[id number]]  (Abnormal) Collected: 07/21/19 0342    Specimen: Blood Updated: 07/21/19 0438     WBC 5.92 x10 3/uL      Hgb 12.2 g/dL      Hematocrit 72.5 %      Platelets 281 x10 3/uL  RBC 4.49 x10 6/uL      MCV 85.7 fL      MCH 27.2 pg      MCHC 31.7 g/dL      RDW 15 %      MPV 11.1 fL      Neutrophils 52.9 %      Lymphocytes Automated 27.2 %      Monocytes 15.2 %      Eosinophils Automated 4.2 %      Basophils Automated 0.3 %      Immature Granulocytes 0.2 %      Nucleated RBC 0.0 /100 WBC      Neutrophils Absolute 3.13 x10 3/uL      Lymphocytes Absolute Automated 1.61 x10 3/uL      Monocytes Absolute Automated 0.90 x10 3/uL      Eosinophils Absolute Automated 0.25 x10 3/uL      Basophils Absolute Automated 0.02 x10 3/uL      Immature Granulocytes Absolute 0.01 x10 3/uL      Absolute NRBC 0.00 x10 3/uL           Radiologic Studies -   Radiology Results (24  Hour)     Procedure Component Value Units Date/Time    MRI Foot Left WO Contrast [[id number]] Collected: 07/21/19 0816    Order Status: Completed Updated: 07/21/19 0835    Narrative:        MRI FOOT LEFT WO CONTRAST    CLINICAL INDICATION:   Suspected abscess or osteomyelitis    COMPARISON: Left foot radiographs of 07/19/2019    TECHNIQUE: Routine MRI of the left foot without contrast with attention  to the mid to forefoot    FINDINGS:      The study is degraded by motion artifact. There are areas of increased  T2 marrow signal within the distal phalanges of the third, fourth, and  fifth toes, most pronounced in the fifth toe distal phalanx. There is no  definitive evidence for corresponding low T1 signal. This is  nonspecific. However, early osteomyelitis cannot be excluded.     There is marrow edema within the medial sesamoid bone of the first MTP  joint with associated fragmented appearance. The marrow signal of the  imaged osseous structures of the foot otherwise appears unremarkable for  patient age and motion artifact. There is no significant joint effusion.  There is no MR evidence to suggest soft tissue abscess within the  limitations of a noncontrasted study. There is nonspecific edema in the  subcutaneous fat. There is tenosynovitis of the flexor hallucis longus  tendon as it passes beneath the midfoot.      Impression:         1. Limited study secondary motion artifact.    2. Areas of increased T2 signal intensity within the distal phalanges of  the third, fourth, fifth toes as described above is nonspecific.  However, early osteomyelitis cannot be excluded. Clinical correlation is  recommended.    3. Marrow edema of the medial sesamoid bone of the first toe with  associated fragmented appearance suggesting stress reaction within a  bipartite sesamoid bone.    4. Tenosynovitis of the flexor hallucis longus tendon as described  above.    Sandie Ano, MD   07/21/2019 8:33 AM      .      Medical Decision  Making   I am the first provider for this patient.    Vital Signs-Reviewed the patient's vital signs.  ED Course: d/w dr. Greig Castilla who will admit.              Diagnosis     Clinical Impression:   1. IV drug abuse    2. Cellulitis and abscess of left leg        _______________________________    Attestations:  This note is prepared by Avanell Shackleton, MD.     Avanell Shackleton, MD.  I confirm that the note above accurately reflects all work, treatment, procedures, and medical decision making performed by me.    _______________________________       Azzie Glatter, MD  07/22/19 1051

## 2019-07-19 NOTE — ED Triage Notes (Signed)
EMERGENCY DEPARTMENT PIT NOTE    Patient Name: Melissa Avila has had a rapid medical screening evaluation initiated by myself for the chief complaint of Left lower extremity and foot pain, swelling and redness since waking up this morning. Patient states she noticed redness to anterior shin that has been spreading down legDenies trauma, prev episodes, fever, chills, cp, sob, nv, abd pain.     Vitals: BP 121/77    Pulse (!) 111    Temp 98.5 F (36.9 C) (Oral)    Resp 15    Ht 5\' 1"  (1.549 m)    Wt 50.5 kg    SpO2 97%    BMI 21.05 kg/m   Pertinent brief exam: Left CT, LLE edema, erythema, and ttp  Prelim orders: labs, doppler, x-ray    Patient advised to remain in the ED until further evaluation can be performed. Patient instructed to notify staff of any changes in condition while waiting.    I am not the sole provider and this assessment is only an initial evaluation prior to full evaluation to expedite care.

## 2019-07-19 NOTE — Progress Notes (Signed)
Initial Pharmacy Vancomycin Dosing Consult:  Day 1  Vancomycin Indication: SSTI  Vancomycin Target Trough Level: 10-15 mg/L     Pertinent Cultures:     Date Source  Organism & Pertinent Susceptibilities     07/19/19 Blood cx x 2  Pending                   As appropriate, contact physician to consider change in therapy if cultures grow organism other than MRSA     Renal Assessment:   Serum creatinine: 0.7 mg/dL 16/10/96 0454  Estimated creatinine clearance: 89.5 mL/min  Baseline SCr: 0.7  Nephrotoxic drugs administered in the past 48 hours: None    Assessment  30 YO F with IV drug use hx being started on empiric vancomycin for SSTI.     Recommendations:   Maintenance regimen (15 mg/kg) = 750 mg Q 12 hr  Vancomycin level: Trough ordered prior to 4th dose 07/21/19 @0800     Thank you,  Jacklynn Barnacle, PharmD  Phone: [phone]

## 2019-07-20 ENCOUNTER — Inpatient Hospital Stay: Payer: Medicaid HMO

## 2019-07-20 DIAGNOSIS — F1110 Opioid abuse, uncomplicated: Secondary | ICD-10-CM | POA: Diagnosis present

## 2019-07-20 DIAGNOSIS — F1510 Other stimulant abuse, uncomplicated: Secondary | ICD-10-CM | POA: Diagnosis present

## 2019-07-20 DIAGNOSIS — F1990 Other psychoactive substance use, unspecified, uncomplicated: Secondary | ICD-10-CM | POA: Diagnosis present

## 2019-07-20 LAB — CBC AND DIFFERENTIAL
Absolute NRBC: 0 10*3/uL (ref 0.00–0.00)
Basophils Absolute Automated: 0.03 10*3/uL (ref 0.00–0.08)
Basophils Automated: 0.5 %
Eosinophils Absolute Automated: 0.21 10*3/uL (ref 0.00–0.44)
Eosinophils Automated: 3.4 %
Hematocrit: 38.4 % (ref 34.7–43.7)
Hgb: 11.9 g/dL (ref 11.4–14.8)
Immature Granulocytes Absolute: 0.02 10*3/uL (ref 0.00–0.07)
Immature Granulocytes: 0.3 %
Lymphocytes Absolute Automated: 1.69 10*3/uL (ref 0.42–3.22)
Lymphocytes Automated: 27 %
MCH: 26.6 pg (ref 25.1–33.5)
MCHC: 31 g/dL — ABNORMAL LOW (ref 31.5–35.8)
MCV: 85.7 fL (ref 78.0–96.0)
MPV: 10.5 fL (ref 8.9–12.5)
Monocytes Absolute Automated: 0.98 10*3/uL — ABNORMAL HIGH (ref 0.21–0.85)
Monocytes: 15.7 %
Neutrophils Absolute: 3.33 10*3/uL (ref 1.10–6.33)
Neutrophils: 53.1 %
Nucleated RBC: 0 /100 WBC (ref 0.0–0.0)
Platelets: 260 10*3/uL (ref 142–346)
RBC: 4.48 10*6/uL (ref 3.90–5.10)
RDW: 15 % (ref 11–15)
WBC: 6.26 10*3/uL (ref 3.10–9.50)

## 2019-07-20 LAB — BASIC METABOLIC PANEL
Anion Gap: 7 (ref 5.0–15.0)
BUN: 10 mg/dL (ref 7.0–19.0)
CO2: 22 mEq/L (ref 22–29)
Calcium: 8.4 mg/dL — ABNORMAL LOW (ref 8.5–10.5)
Chloride: 105 mEq/L (ref 100–111)
Creatinine: 0.7 mg/dL (ref 0.6–1.0)
Glucose: 78 mg/dL (ref 70–100)
Potassium: 4.3 mEq/L (ref 3.5–5.1)
Sodium: 134 mEq/L — ABNORMAL LOW (ref 136–145)

## 2019-07-20 LAB — URINALYSIS, REFLEX TO MICROSCOPIC EXAM IF INDICATED
Bilirubin, UA: NEGATIVE
Blood, UA: NEGATIVE
Glucose, UA: NEGATIVE
Nitrite, UA: POSITIVE — AB
Protein, UR: 30 — AB
Specific Gravity UA: 1.018 (ref 1.001–1.035)
Urine pH: 6 (ref 5.0–8.0)
Urobilinogen, UA: NEGATIVE mg/dL (ref 0.2–2.0)

## 2019-07-20 LAB — HEMOLYSIS INDEX: Hemolysis Index: 0 (ref 0–18)

## 2019-07-20 LAB — RAPID DRUG SCREEN, URINE
Barbiturate Screen, UR: NEGATIVE
Benzodiazepine Screen, UR: NEGATIVE
Cannabinoid Screen, UR: POSITIVE — AB
Cocaine, UR: NEGATIVE
Opiate Screen, UR: NEGATIVE
PCP Screen, UR: NEGATIVE
Urine Amphetamine Screen: POSITIVE — AB

## 2019-07-20 LAB — GFR: EGFR: >60

## 2019-07-20 MED ORDER — CLOTRIMAZOLE 1 % EX CREA
TOPICAL_CREAM | Freq: Two times a day (BID) | CUTANEOUS | Status: DC
Start: 2019-07-20 — End: 2019-07-21
  Filled 2019-07-20 (×2): qty 15

## 2019-07-20 MED ORDER — KETOROLAC TROMETHAMINE 15 MG/ML IJ SOLN
15.00 mg | Freq: Four times a day (QID) | INTRAMUSCULAR | Status: DC | PRN
Start: 2019-07-20 — End: 2019-07-21

## 2019-07-20 MED ORDER — METHADONE HCL 10 MG PO TABS
20.0000 mg | ORAL_TABLET | Freq: Every day | ORAL | Status: DC
Start: 2019-07-20 — End: 2019-07-21
  Administered 2019-07-20 – 2019-07-21 (×2): 20 mg via ORAL
  Filled 2019-07-20 (×2): qty 2

## 2019-07-20 NOTE — Progress Notes (Signed)
Call AND sent message to  pharmacy for Pt Ancef  Antibiotic.Marland Kitchen Spoke with [facility] and promise to tube med.

## 2019-07-20 NOTE — Progress Notes (Incomplete)
Pt is A&OX4, able to make her needs known. Arrived from ED in a stable condition. Vital signs monitored and recorded, afebrile. Denies dizziness at this time. she is able to ambulate with one staff stand by assist. Denies SOB, chest pain, N/V, nor diarrhea. IV assessed, telemetry box applied, ID armband checked, whiteboard updated, educated on hourly rounding, bed in low position, pt wearing non-skid footwear, call bell w/in reach, and given personal supplies/food/drink. Belongings assessed and documented. No medications found. Education needs assessed. Care plan and fall prevention discussed with pt.   POC: IV abx (vancomycin and ancef),24 hour tele monitoring, no fall, pain management.

## 2019-07-20 NOTE — Progress Notes (Signed)
HOSPITALIST PROGRESS NOTE  [facility]      Patient: Melissa Avila  Date: 07/20/2019    LOS: 0 Days  Admission Date: 07/19/2019   MRN: [id number]  Attending: Teodora Medici  MD, MPH                    Please contact via Secure Chat                    Spectralink: [phone]                    Pager: [phone]     ASSESSMENT/PLAN     Melissa Avila is a 29 y.o. female admitted with Cellulitis and abscess of left leg    Interval Summary:     Active Hospital Problems    Diagnosis    IVDU (intravenous drug user)    Amphetamine abuse    Heroin abuse    Cellulitis and abscess of left leg     Patient Active Hospital Problem List:   Cellulitis and abscess of left leg (07/19/2019)    Assessment: Improvement after left leg erythema and pain since admission; continues to have some swelling and erythema; also noted to have evidence of tinea pedis.  Patient denies any injection to the left leg.     Plan: continue vancomycin 750 mg IV q12h    pharmacy to dose medication   discontinued ancef    ID consult requested    Ordered clotrimazole cream 1% twice daily to be applied to the left foot     IVDU (intravenous drug user) (07/20/2019)   Amphetamine abuse (07/20/2019)   Heroin abuse (07/20/2019)    Assessment: wants to talk about drug rehab options; last use was 2 days ago    Plan: We will start methadone 20 mg p.o. daily to prevent withdrawal symptoms     Analgesia: Tylenol and Toradol    Nutrition: Regular    GI Prophylaxis: None    DVT Prophylaxis: Lovenox    Code Status: Full code    DISPO: Plans for discharge in 1 to 2 days       SUBJECTIVE     Melissa Avila states that she is feeling better since admission.     Febrile- None   Eating- Well   Coughing- None   Diarrhea- None   Sleeping - Well   Voiding - Well        MEDICATIONS     Current Facility-Administered Medications   Medication Dose Route Frequency    enoxaparin  40 mg Subcutaneous Daily    methadone  20 mg Oral Daily    vancomycin  750 mg  Intravenous Q12H    vancomycin   Intravenous See Admin Instructions       PHYSICAL EXAM     Vitals:    07/20/19 0742   BP: 114/70   Pulse: 81   Resp: 18   Temp: 98.6 F (37 C)   SpO2: 94%       Temperature: Temp  Min: 97.5 F (36.4 C)  Max: 98.6 F (37 C)  Pulse: Pulse  Min: 70  Max: 111  Respiratory: Resp  Min: 15  Max: 20  Non-Invasive BP: BP  Min: 108/71  Max: 121/62  Pulse Oximetry SpO2  Min: 94 %  Max: 100 %    Intake and Output Summary (Last 24 hours) at Date Time    Intake/Output Summary (Last  24 hours) at 07/20/2019 1122  Last data filed at 07/20/2019 0300  Gross per 24 hour   Intake 560 ml   Output 800 ml   Net -240 ml       Physical Exam  Constitutional:       General: She is not in acute distress.     Appearance: She is not diaphoretic.   HENT:      Head: Normocephalic and atraumatic.      Right Ear: External ear normal.      Left Ear: External ear normal.   Eyes:      Pupils: Pupils are equal, round, and reactive to light.   Neck:      Thyroid: No thyromegaly.      Vascular: No JVD.      Trachea: No tracheal deviation.   Cardiovascular:      Rate and Rhythm: Normal rate and regular rhythm.      Heart sounds: Normal heart sounds. No murmur heard.   No friction rub. No gallop.    Pulmonary:      Effort: Pulmonary effort is normal. No respiratory distress.      Breath sounds: Normal breath sounds. No stridor. No wheezing or rales.   Chest:      Chest wall: No tenderness.   Abdominal:      General: Bowel sounds are normal. There is no distension.      Palpations: Abdomen is soft. There is no mass.      Tenderness: There is no abdominal tenderness. There is no guarding or rebound.   Musculoskeletal:         General: Swelling (left leg ) present. No tenderness or deformity. Normal range of motion.      Cervical back: Normal range of motion and neck supple.   Lymphadenopathy:      Cervical: No cervical adenopathy.   Skin:     General: Skin is warm and dry.      Coloration: Skin is not pale.      Findings:  Erythema (left shin and ankle erythema noted) present. No rash.      Comments: Interdigital scaly rash of the left foot   Neurological:      Mental Status: She is alert and oriented to person, place, and time.      Cranial Nerves: No cranial nerve deficit.      Motor: No abnormal muscle tone.      Coordination: Coordination normal.      Gait: Gait is intact.      Deep Tendon Reflexes: Reflexes are normal and symmetric.   Psychiatric:         Mood and Affect: Mood and affect normal.         Cognition and Memory: Memory normal.         Judgment: Judgment normal.             LABS     Recent Labs   Lab 07/20/19  0314 07/19/19  1734   WBC 6.26 7.31   RBC 4.48 4.71   Hgb 11.9 12.7   Hematocrit 38.4 40.4   MCV 85.7 85.8   Platelets 260 184       Recent Labs   Lab 07/20/19  0314 07/19/19  1734   Sodium 134* 136   Potassium 4.3 4.3   Chloride 105 104   CO2 22 22   BUN 10.0 10.0   Creatinine 0.7 0.7   Glucose 78 84   Calcium 8.4*  9.2       Recent Labs   Lab 07/19/19  1734   ALT 55   AST (SGOT) 46*   Bilirubin, Total 0.8   Albumin 3.7   Alkaline Phosphatase 107*                   Microbiology Results (last 15 days)     Procedure Component Value Units Date/Time    Blood Culture Aerobic/Anaerobic #1 [[id number]] Collected: 07/19/19 1735    Order Status: Sent Specimen: Arm from Blood, Venipuncture Updated: 07/19/19 1902    Narrative:      1 BLUE+1 PURPLE    Blood Culture Aerobic/Anaerobic #2 [[id number]] Collected: 07/19/19 1735    Order Status: Sent Specimen: Arm from Blood, Venipuncture Updated: 07/19/19 1902    Narrative:      1 BLUE+1 PURPLE    COVID-19 (SARS-COV-2) [facility] Rapid) [[id number]] Collected: 07/19/19 1734    Order Status: Completed Specimen: Nasopharyngeal Swab from Nasopharynx Updated: 07/19/19 1805     Purpose of COVID testing Screening     SARS-CoV-2 Specimen Source Nasopharyngeal     SARS CoV 2 Overall Result Negative     Comment: Test performed using the Abbott ID NOW EUA assay.  Please see Fact Sheets for  patients and providers located at:  http://www.rice.biz/    This test is for the qualitative detection of SARS-CoV-2  (COVID19) nucleic acid. Viral nucleic acids may persist in vivo,  independent of viability. Detection of viral nucleic acid does  not imply the presence of infectious virus, or that virus  nucleic acid is the cause of clinical symptoms. Negative  results should be treated as presumptive and, if inconsistent  with clinical signs and symptoms or necessary for patient  management, should be tested with an alternative molecular  assay. Negative results do not preclude SARS-CoV-2 infection  and should not be used as the sole basis for patient  management decisions. Invalid results may be due to inhibiting  substances in the specimen and recollection should occur.         Narrative:      o Collect and clearly label specimen type:  o Upper respiratory specimen: One Nasopharyngeal Dry Swab NO  Transport Media.  o Hand deliver to laboratory ASAP  Indication for testing->Extended care facility admission to  semi private room  Screening           Tibia Fibula Left AP and Lateral    Result Date: 07/19/2019   No fracture or dislocation. Kinnie Feil, MD  07/19/2019 4:18 PM    Foot Left AP Lateral and Oblique    Result Date: 07/19/2019   Lucent line through the medial first sesamoid. This could represent a bipartite sesamoid or fracture. Clinical correlation Kinnie Feil, MD  07/19/2019 4:17 PM    [name] VenoDopp Low Extremity Left    Result Date: 07/19/2019   Normal venous duplex of the left lower extremity.  No evidence of deep venous thrombosis. Larrie Kass, MD  07/19/2019 4:24 PM      Echo Results     None          I have personally reviewed the patient's labs, medications, and imaging    Total visit time = 35 mins ; more than 50% spent counseling/coordinating care    Signed,    Teodora Medici MD, MPH  11:22 AM 07/20/2019   Please contact via Secure Chat   SpectraLink: [phone]  Pager: [phone]       This chart  was  generated using a medical voice-recognition software which does not employ spell-checking or grammar-checking features. It was dictated, all or in part, in a busy and often noisy patient care environment. I have taken all usual measures to dictate carefully and to review all aspects of this chart. Nonetheless, given the known and well-documented performance characteristics of voice recognition software in such patient care environments, this dictation still may contain unrecognized and wholly unintended errors or omissions.

## 2019-07-20 NOTE — UM Notes (Addendum)
[facility] Utilization Review  NPI: [id number], Tax ID: [id number]  Please Call Laury Axon @ [phone] with any questions or concerns. Confidential voicemail.  Email: [email or web address]   Fax final authorizations and requests for additional information to [phone]      07/19/19 1938    Admit to Observation Once   Comments: Remote tele  Diagnosis: Cellulitis And Abscess Of Left Leg   Level of Care: Acute   Admitting Physician Marya Amsler     07/19/19 Medicine Assessment:  29 y.o. female with medical history of tobacco dependence, IV drug abuse, hepatitis C (never received treatment) who presented with left leg swelling and pain.      07/19/19 1506 -- 98.5 111 97 % 15 121/77    07/20/19 0742 -- 98.6   81 94 % -- 18 114/70      07/20/2019 03:14  Sodium: 134    07/20/2019 03:14  Calcium: 8.4     07/20/2019 03:14  Clarity, UA: Sl Cloudy   Leukocyte Esterase, UA: Large  Nitrite, UA: Positive   Protein, UR: 30   Ketones UA: Trace   WBC, UA: TNTC     07/20/2019 03:14  Amphetamines, UR: Positive   Cannabinoid Screen, UR: Positive         07/19/19 Medicine Assessment/Plan  # LLE cellulitis  # Lucent line through the medial first sesamoid per L foot XR  # IVDA  # Tachycardia; HR 111. No murmur.  Likely due to left lower extremity cellulitis.  # Tobacco dependence  # Hepatitis C (never received treatment)     -[name] LLE; Per report, Normal venous duplex of the left lower extremity. No  evidence of deep venous thrombosis.  -XR L tibia/fibula; Per report, No fracture or dislocation.  -XR L foot; Per report, Lucent line through the medial first sesamoid. This could  represent a bipartite sesamoid or fracture.     -Will proceed with EKG.  -Will proceed with MRI left foot.    -Blood culture sent by ED.  Patient received a dose of Ancef also, will continue but will give 1 dose of vancomycin.  -Left leg elevation.  -Warm compression  -Pain management with Tylenol as needed, oxycodone as needed for moderate pain and  IV morphine as needed for severe pain.  -Telemetry monitoring.  Patient has tachycardia, but no other signs and symptoms of endocarditis.  Holding echocardiogram.  -Will send U tox.  UA sent by ED.  -Smoking cessation education and substance abuse cessation education provided.  -AST is 46 and alk phos is 107.  Patient can follow her PCP as outpatient for further hep C management if needed.    07/19/19 Foot Left AP Lateral and Oblique   IMPRESSION:    Lucent line through the medial first sesamoid. This could  represent a bipartite sesamoid or fracture. Clinical correlation    07/19/19 Tibia Fibula Left AP and Lateral  IMPRESSION:    No fracture or dislocation.    07/19/19 [name] VenoDopp Low Extremity Left  IMPRESSION:    Normal venous duplex of the left lower extremity.  No  evidence of deep venous thrombosis    07/20/19 ID Assessment  1.  Left lower extremity cellulitis possibly secondary to mild trauma but no evidence of skin popping in the leg    Recommendations  1.  Check blood cultures  2.  Vancomycin    Scheduled Meds:  Current Facility-Administered Medications   Medication Dose Route Frequency    ceFAZolin  1 g  Intravenous Q8H    enoxaparin  40 mg Subcutaneous Daily    vancomycin  750 mg Intravenous Q12H    vancomycin   Intravenous See Admin Instructions     ceFAZolin (ANCEF) 2 g in dextrose 5 % 50 mL IVPB   Dose: 2 g  Freq: Once Route: IV  07/19/19 1806     ketorolac (TORADOL) injection 30 mg   Dose: 30 mg  Freq: Once Route: IV   07/19/19 1516     07/20/19 Medicine Assessment/Plan:   IVDU (intravenous drug user)   Amphetamine abuse   Heroin abuse   Cellulitis and abscess of left leg    Patient Active Hospital Problem List:   Cellulitis and abscess of left leg (07/19/2019)    Assessment: Improvement after left leg erythema and pain since admission; continues to have some swelling and erythema; also noted to have evidence of tinea pedis.  Patient denies any injection to the left leg.     Plan: continue vancomycin  750 mg IV q12h    pharmacy to dose medication   discontinued ancef    ID consult requested    Ordered clotrimazole cream 1% twice daily to be applied to the left foot     IVDU (intravenous drug user) (07/20/2019)   Amphetamine abuse (07/20/2019)   Heroin abuse (07/20/2019)    Assessment: wants to talk about drug rehab options; last use was 2 days ago    Plan: We will start methadone 20 mg p.o. daily to prevent withdrawal symptoms    Analgesia: Tylenol and Toradol

## 2019-07-20 NOTE — Plan of Care (Signed)
Pt alert and oriented. Able to verbalizes needs. Ambulate to bathroom by self. Independent of care. Continue on IV antibiotics for cellulitis.Tolerated meds as ordered. Encouraged to ask for help when needed. .    Problem: Moderate/High Fall Risk Score >5  Goal: Patient will remain free of falls  Flowsheets (Taken 07/20/2019 1617)  High (Greater than 13):   LOW-Anticoagulation education for injury risk   LOW-Fall Interventions Appropriate for Low Fall Risk   MOD-Use of chair-pad alarm when appropriate     Problem: Safety  Goal: Patient will be free from injury during hospitalization  Flowsheets (Taken 07/20/2019 1618)  Patient will be free from injury during hospitalization:   Hourly rounding   Use appropriate transfer methods   Assess patient's risk for falls and implement fall prevention plan of care per policy   Provide and maintain safe environment   Ensure appropriate safety devices are available at the bedside   Include patient/ family/ care giver in decisions related to safety   Assess for patients risk for elopement and implement Elopement Risk Plan per policy     Problem: Discharge Barriers  Goal: Patient will be discharged home or other facility with appropriate resources  Flowsheets (Taken 07/20/2019 1618)  Discharge to home or other facility with appropriate resources:   Provide appropriate patient education   Initiate discharge planning   Provide information on available health resources     Problem: Compromised Tissue integrity  Goal: Damaged tissue is healing and protected  Flowsheets (Taken 07/20/2019 1619)  Damaged tissue is healing and protected:   Avoid shearing injuries   Provide wound care per wound care algorithm   Monitor/assess Braden scale every shift   Reposition patient every 2 hours and as needed unless able to reposition self   Increase activity as tolerated/progressive mobility   Relieve pressure to bony prominences for patients at moderate and high risk   Keep intact skin  clean and dry   Use bath wipes, not soap and water, for daily bathing   Use incontinence wipes for cleaning urine, stool and caustic drainage. Foley care as needed   Monitor external devices/tubes for correct placement to prevent pressure, friction and shearing   Monitor patient's hygiene practices   Encourage use of lotion/moisturizer on skin

## 2019-07-20 NOTE — Consults (Signed)
Infectious diseases consultation    Requesting physician:Tarique Gaynelle Adu  Reason for request: Cellulitis  Date of admission: July 6  Date of consultation: July 7    This is a 29 year old white female who last used IV drugs 5 days ago.  Night before admission she noted some redness of the left shin spreading to left foot.  She denies any fevers.    Past medical history  1.  Hepatitis C  2.  Right knee surgery    Family history remarkable    Social history  IV drug abuse she says she injects in her arms and not in her legs.  Nicotine use  Denies ethanol use    No medications    No antibiotic allergies    Review of systems  General: No fever or chills  Respiratory: No cough chest pain shortness breath or wheezing  Cardiac: No angina palpitations  Neuro: No headache syncope or seizures  GI: No nausea vomiting or diarrhea  GU: No dysuria urgency or frequency  Musculoskeletal: No arthralgias or myalgias  Integument: See exam  Endocrine: No complaints  Hematology: No complaints    Physical examination  Vital signs: Afebrile blood pressure 118/80 pulse 98  General: Nontoxic  Left lower extremity erythema and warmth probably no signs of skin popping there is a lesion on her left inner ankle which appears more traumatic than from needlestick  Lungs: Clear  Heart: Regular rhythm S1-S2 appreciated without murmur rub or gallop  Abdomen: Bowel sounds heard soft nontender  Neuro: Grossly intact    Doppler: Normal venous duplex    Blood cultures pending    Labs  WBC 6.2 hemoglobin 11.9 hematocrit 38.4 platelet count 260,000  BUN 10 creatinine 0.7 CO2 22 lactic acid 1.49 bilirubin 0.8 alk phos 1 7 ALT 55 AST 46  Urinalysis esterase large nitrates large too numerous to count white cells  Tox screen positive for vitamins cannabis     Assessment  1.  Left lower extremity cellulitis possibly secondary to mild trauma but no evidence of skin popping in the leg    Recommendations  1.  Check blood cultures  2.  Vancomycin  Thank you for  consultation

## 2019-07-21 DIAGNOSIS — M659 Synovitis and tenosynovitis, unspecified: Secondary | ICD-10-CM | POA: Diagnosis present

## 2019-07-21 LAB — BASIC METABOLIC PANEL
Anion Gap: 8 (ref 5.0–15.0)
BUN: 9 mg/dL (ref 7.0–19.0)
CO2: 23 mEq/L (ref 22–29)
Calcium: 8.7 mg/dL (ref 8.5–10.5)
Chloride: 106 mEq/L (ref 100–111)
Creatinine: 0.6 mg/dL (ref 0.6–1.0)
Glucose: 85 mg/dL (ref 70–100)
Potassium: 4.1 mEq/L (ref 3.5–5.1)
Sodium: 137 mEq/L (ref 136–145)

## 2019-07-21 LAB — CBC AND DIFFERENTIAL
Absolute NRBC: 0 10*3/uL (ref 0.00–0.00)
Basophils Absolute Automated: 0.02 10*3/uL (ref 0.00–0.08)
Basophils Automated: 0.3 %
Eosinophils Absolute Automated: 0.25 10*3/uL (ref 0.00–0.44)
Eosinophils Automated: 4.2 %
Hematocrit: 38.5 % (ref 34.7–43.7)
Hgb: 12.2 g/dL (ref 11.4–14.8)
Immature Granulocytes Absolute: 0.01 10*3/uL (ref 0.00–0.07)
Immature Granulocytes: 0.2 %
Lymphocytes Absolute Automated: 1.61 10*3/uL (ref 0.42–3.22)
Lymphocytes Automated: 27.2 %
MCH: 27.2 pg (ref 25.1–33.5)
MCHC: 31.7 g/dL (ref 31.5–35.8)
MCV: 85.7 fL (ref 78.0–96.0)
MPV: 11.1 fL (ref 8.9–12.5)
Monocytes Absolute Automated: 0.9 10*3/uL — ABNORMAL HIGH (ref 0.21–0.85)
Monocytes: 15.2 %
Neutrophils Absolute: 3.13 10*3/uL (ref 1.10–6.33)
Neutrophils: 52.9 %
Nucleated RBC: 0 /100 WBC (ref 0.0–0.0)
Platelets: 281 10*3/uL (ref 142–346)
RBC: 4.49 10*6/uL (ref 3.90–5.10)
RDW: 15 % (ref 11–15)
WBC: 5.92 10*3/uL (ref 3.10–9.50)

## 2019-07-21 LAB — VANCOMYCIN, TROUGH: Vancomycin Trough: 3.9 ug/mL — ABNORMAL LOW (ref 10.0–20.0)

## 2019-07-21 LAB — GFR: EGFR: >60

## 2019-07-21 LAB — HEMOLYSIS INDEX: Hemolysis Index: 2 (ref 0–18)

## 2019-07-21 MED ORDER — ACETAMINOPHEN 325 MG PO TABS
650.00 mg | ORAL_TABLET | Freq: Four times a day (QID) | ORAL | Status: AC | PRN
Start: 2019-07-21 — End: ?

## 2019-07-21 MED ORDER — VANCOMYCIN 1000 MG IN 250 ML NS IVPB VIAL-MATE (CNR)
1000.00 mg | Freq: Two times a day (BID) | INTRAVENOUS | Status: DC
Start: 2019-07-21 — End: 2019-07-21
  Filled 2019-07-21: qty 250

## 2019-07-21 MED ORDER — DOXYCYCLINE HYCLATE 100 MG PO TABS
100.00 mg | ORAL_TABLET | Freq: Two times a day (BID) | ORAL | 0 refills | Status: AC
Start: 2019-07-21 — End: 2019-08-08

## 2019-07-21 MED ORDER — RISAQUAD PO CAPS
1.00 | ORAL_CAPSULE | Freq: Every day | ORAL | 0 refills | Status: AC
Start: 2019-07-21 — End: 2019-08-20

## 2019-07-21 MED ORDER — CLOTRIMAZOLE 1 % EX CREA
TOPICAL_CREAM | Freq: Two times a day (BID) | CUTANEOUS | 0 refills | Status: AC
Start: 2019-07-21 — End: 2019-07-31

## 2019-07-21 NOTE — Discharge Instr - AVS First Page (Signed)
[facility] DISCHARGE INSTRUCTIONS     Date of Admission: 07/19/2019    Date of Discharge: 07/21/2019    Consultants: infectious disease    Pending Studies: None    Further Instructions: Please follow up with the providers/clinics listed below.     Surgery/Procedure: Tibia Fibula Left AP and Lateral    Result Date: 07/19/2019   No fracture or dislocation. Kinnie Feil, MD  07/19/2019 4:18 PM    Foot Left AP Lateral and Oblique    Result Date: 07/19/2019   Lucent line through the medial first sesamoid. This could represent a bipartite sesamoid or fracture. Clinical correlation Kinnie Feil, MD  07/19/2019 4:17 PM    MRI Foot Left WO Contrast    Result Date: 07/21/2019   1. Limited study secondary motion artifact. 2. Areas of increased T2 signal intensity within the distal phalanges of the third, fourth, fifth toes as described above is nonspecific. However, early osteomyelitis cannot be excluded. Clinical correlation is recommended. 3. Marrow edema of the medial sesamoid bone of the first toe with associated fragmented appearance suggesting stress reaction within a bipartite sesamoid bone. 4. Tenosynovitis of the flexor hallucis longus tendon as described above. Sandie Ano, MD  07/21/2019 8:33 AM    [name] VenoDopp Low Extremity Left    Result Date: 07/19/2019   Normal venous duplex of the left lower extremity.  No evidence of deep venous thrombosis. Larrie Kass, MD  07/19/2019 4:24 PM       Admission Diagnosis: Cellulitis and abscess of left leg [L03.116, L02.416]    Problem List  Active Hospital Problems    Diagnosis    Tenosynovitis of ankle- suspected     IVDU (intravenous drug user)    Amphetamine abuse    Heroin abuse    Cellulitis and abscess of left leg          Discharge Medications     Medication List        START taking these medications      acetaminophen 325 MG tablet  Commonly known as: TYLENOL  Take 2 tablets (650 mg total) by mouth every 6 (six) hours as needed for Pain     clotrimazole 1 % cream  Commonly known  as: LOTRIMIN  Apply topically 2 (two) times daily for 10 days     doxycycline 100 MG tablet  Commonly known as: VIBRA-TABS  Take 1 tablet (100 mg total) by mouth 2 (two) times daily for 18 days     lactobacillus/streptococcus Caps  Take 1 capsule by mouth daily               Where to Get Your Medications        These medications were sent to [id number] [place], [place] - [address]  [address] [place] [address]      Phone: [phone]   clotrimazole 1 % cream  doxycycline 100 MG tablet  lactobacillus/streptococcus Caps       Information about where to get these medications is not yet available    Ask your nurse or doctor about these medications  acetaminophen 325 MG tablet         Follow up Appointments  Follow-up Information       Primary Care Clinic. Schedule an appointment as soon as possible for a visit in 1 week(s).    Why: To reassess left leg for resolution of cellulitis  Infectious disease. Schedule an appointment as soon as possible for a visit in 2 week(s).    Why: To reassess if suspected tenosynovitis has resolved and if antibiotic course needs to be adjusted                      Call Your Doctor if you have:   Chest pain   Shortness of breath or difficulty breathing   Temperature greater than 100.4   Persistent nausea and vomiting   Severe uncontrolled pain   Signs of infection (pain, swelling, redness, tenderness, odor, or green/yellow discharge)   Headache or visual disturbances   Hives   Persistent dizziness or light-headedness   Extreme fatigue   Bleeding   Any other questions or concerns you may have after discharge   In an emergency, call 911 or go to an Emergency Department at a nearby hospital     Additional Instructions:   -Schedule a follow up appointment with your Primary Care Provider within 1 week of discharge.   -Carry a list of your medications and allergies with you at all times   -Resume your usual diet unless otherwise directed. Good  nutrition promotes quicker healing.   -Call your pharmacy at least 1 week in advance to refill prescriptions     Information on Taking Medicine Safely   Medicine is given to help treat or prevent illness. But if you dont take it correctly, it might not help. It might even harm you. Your doctor or pharmacist can help you learn the right way to take your medicine. Listed below are some tips to help you take medicine safely.     Safety Tips   1. Have a routine for taking each medicine. Make it part of something you do each day, such as brushing your teeth or eating a meal.   2. When you go to the hospital or your doctors office, bring all your current medicines in their original boxes or bottles. If you cant do that, bring an up-to-date list of your medicines.   3. Do not stop taking a prescription medicine unless your doctor tells you to. Doing so could make your condition worse.   4. Do not share medicines.   5. Let your doctor and pharmacist know of any allergies you have.   6. Taking prescription medicines with alcohol, street drugs, herbs, supplements, or even some over-the-counter medicines can be harmful. Talk to your doctor or pharmacist before using any of these things while taking a prescription medcine.   7. When filling your prescriptions, try using the same pharmacy for all your medicines. If not, let the pharmacist know what medicines you are already taking.   8. Keep medicines out of the reach of children and pets. Store medicines in a cool, dry, dark place--not in the bathroom or in the kitchen near moisture or heat.   9. Do not use medicine that has expired or that doesnt look or smell right. Bring expired or old medicine to your pharmacy for disposal.     Using Generic Medicines   Medicines have brand names and generic (chemical) names. When a medicine is first made, it is sold only under its brand name. Later, it can be made and sold as a generic. Generic medicines cost less than brand-name  medicines and most work just as well. Unless their doctor says otherwise, most people can use the generic medicine instead of the brand-name medicine.     Always follow your  healthcare professional's instructions.     If you are unable to obtain an appointment, unable to obtain newly prescribed medications, or are unclear about any of your discharge instructions please contact me at [phone] (M-F, 8am-3pm) or weekends and after hours via the hospital operator [phone], the hospital case manager, or your primary care physician.    Finally, as your discharging physician, you may be receiving a survey which is regarding my care. I would greatly value and appreciate your feedback as I strive for excellence.     Respectfully yours,    Teodora Medici MD, MPH  [facility]  [facility]

## 2019-07-21 NOTE — Progress Notes (Signed)
Patient received discharge education/packet along with prescriptions. Proper pharmacy confirmed by pt. Patient denies any questions about information received. IV removed and transport called.

## 2019-07-21 NOTE — Progress Notes (Signed)
Pt refusing vancomyacin iv dose, wants to leave AMA; contacted Dr. Gaynelle Adu via secure chat-on his way.

## 2019-07-21 NOTE — Discharge Summary (Signed)
[facility]      Patient: Melissa Avila  Admission Date: 07/19/2019   DOB: Oct 17, 1990  Discharge Date: 07/21/2019    MRN: [id number]  Discharge Attending:Lezlee Gills   Referring Physician: Pcp, None, MD  PCP: Pcp, None, MD       DISCHARGE SUMMARY     Discharge Information   Admission Diagnosis:   Cellulitis and abscess of left leg    Discharge Diagnosis:   Active Hospital Problems    Diagnosis    Tenosynovitis of ankle    IVDU (intravenous drug user)    Amphetamine abuse    Heroin abuse    Cellulitis and abscess of left leg        Admission Condition: Left leg pain and swelling due to cellulitis and tenosynovitis  Discharge Condition: Improving  Consultants: Infectious disease  Functional Status: Ambulatory  Discharged to: Home    Discharge Medications:     Medication List      START taking these medications    acetaminophen 325 MG tablet  Commonly known as: TYLENOL  Take 2 tablets (650 mg total) by mouth every 6 (six) hours as needed for Pain     clotrimazole 1 % cream  Commonly known as: LOTRIMIN  Apply topically 2 (two) times daily for 10 days     doxycycline 100 MG tablet  Commonly known as: VIBRA-TABS  Take 1 tablet (100 mg total) by mouth 2 (two) times daily for 18 days     lactobacillus/streptococcus Caps  Take 1 capsule by mouth daily           Where to Get Your Medications      These medications were sent to [id number] [place], [place] - [address]  [address] [place] [address]    Phone: [phone]    clotrimazole 1 % cream   doxycycline 100 MG tablet   lactobacillus/streptococcus Caps     Information about where to get these medications is not yet available    Ask your nurse or doctor about these medications   acetaminophen 325 MG tablet              Hospital Course   Presentation History   Per H&P " Melissa Avila is a 29 y.o. female with medical history of tobacco dependence, IV drug abuse, hepatitis C (never received treatment) who  presented with left leg swelling and pain.      Patient reports that she is having left leg swelling/pain with redness and warmth since yesterday.  Patient admits to use IV heroin and amphetamine but denies injecting drugs in legs, states she uses her arms to inject drugs.  Patient denies fall, trauma, skin rashes, fever, chills, cough, shortness of breath, chest pain, palpitation, weakness/numbness in arms/legs, nausea, vomiting, change in urinary/bowel habit.  Patient also smokes 3-4 cigarettes daily, denies using alcohol.  Patient has history of hep C, never treated. "    See HPI for details.    Hospital Course (1 Days)     Patient Active Hospital Problem List:   Cellulitis and abscess of left leg (07/19/2019) associated with tenosynovitis of ankle (07/21/2019)    Assessment: Improvement after left leg erythema and pain since admission; continues to have some swelling and erythema;  MRI of the left foot confirms tenosynovitis of the flexor hallucis longus tendon. Also noted to have evidence of tinea pedis.  Patient denies any injection to the left leg.  Plan: Switched from vancomycin 750 mg IV q12h to doxycycline 100 mg twice daily or a total of 3 weeks   ID consult appreciated  Recommend clotrimazole cream 1% twice daily to be applied to the left foot  Follow-up with primary care and infectious disease in 1 to 2 weeks     IVDU (intravenous drug user) (07/20/2019)   Amphetamine abuse (07/20/2019)   Heroin abuse (07/20/2019)    Assessment: wants to talk about drug rehab options; last use was 3 days ago; patient appears precontemplative about quitting at this time.  She is planning on getting enrolled in a long-term drug rehab starting November 2021    Plan: Medically cleared for discharge  Patient is not keen on methadone program at this time    Code Status at Discharge: FULL       Best Practices   Was the patient admitted with either a CHF Exacerbation or Pneumonia?  None     Progress Note/Physical Exam at Discharge      Subjective:  Reports feeling better.  Eager to go home.     Vitals:    07/20/19 1943 07/21/19 0006 07/21/19 0339 07/21/19 0730   BP: 118/72 119/78 118/62 117/78   Pulse: 86 75 83 89   Resp: 18 18 18 17    Temp: 98.8 F (37.1 C) 98.6 F (37 C) 98.4 F (36.9 C) 99 F (37.2 C)   TempSrc: Oral Oral Oral Oral   SpO2: 99% 100% 95% 97%   Weight:   50.3 kg (111 lb)    Height:           Physical Exam  Vitals and nursing note reviewed.   Constitutional:       General: She is not in acute distress.     Appearance: Normal appearance. She is normal weight. She is not ill-appearing, toxic-appearing or diaphoretic.   HENT:      Head: Normocephalic and atraumatic.      Right Ear: Tympanic membrane, ear canal and external ear normal.      Left Ear: Tympanic membrane, ear canal and external ear normal.      Nose: Nose normal. No congestion or rhinorrhea.      Mouth/Throat:      Mouth: Mucous membranes are moist.      Pharynx: Oropharynx is clear. No oropharyngeal exudate or posterior oropharyngeal erythema.   Eyes:      General: No scleral icterus.        Right eye: No discharge.         Left eye: No discharge.      Extraocular Movements: Extraocular movements intact.      Conjunctiva/sclera: Conjunctivae normal.      Pupils: Pupils are equal, round, and reactive to light.   Cardiovascular:      Rate and Rhythm: Normal rate and regular rhythm.      Pulses: Normal pulses.      Heart sounds: Normal heart sounds. No murmur heard.   No friction rub. No gallop.    Pulmonary:      Effort: Pulmonary effort is normal. No respiratory distress.      Breath sounds: No stridor. No rhonchi or rales.   Chest:      Chest wall: No tenderness.   Abdominal:      General: Abdomen is flat. Bowel sounds are normal. There is no distension.      Palpations: Abdomen is soft. There is no mass.      Tenderness: There is  no abdominal tenderness. There is no right CVA tenderness, left CVA tenderness, guarding or rebound.      Hernia: No hernia is present.    Genitourinary:     Rectum: Normal.   Musculoskeletal:         General: No swelling, tenderness, deformity or signs of injury. Normal range of motion.      Right lower leg: No edema.      Left lower leg: No edema.   Skin:     General: Skin is warm and dry.      Capillary Refill: Capillary refill takes less than 2 seconds.      Coloration: Skin is not jaundiced or pale.      Findings: No bruising, erythema, lesion or rash.   Neurological:      General: No focal deficit present.      Mental Status: She is alert and oriented to person, place, and time. Mental status is at baseline.      Cranial Nerves: No cranial nerve deficit.      Sensory: No sensory deficit.      Motor: No weakness.      Coordination: Coordination normal.      Gait: Gait normal.      Deep Tendon Reflexes: Reflexes normal.   Psychiatric:         Mood and Affect: Mood normal.         Behavior: Behavior normal.         Thought Content: Thought content normal.         Judgment: Judgment normal.                Diagnostics     Labs/Studies Pending at Discharge: No    Last Labs   Recent Labs   Lab 07/21/19  0342 07/20/19  0314 07/19/19  1734   WBC 5.92 6.26 7.31   RBC 4.49 4.48 4.71   Hgb 12.2 11.9 12.7   Hematocrit 38.5 38.4 40.4   MCV 85.7 85.7 85.8   Platelets 281 260 184       Recent Labs   Lab 07/21/19  0342 07/20/19  0314 07/19/19  1734   Sodium 137 134* 136   Potassium 4.1 4.3 4.3   Chloride 106 105 104   CO2 23 22 22    BUN 9.0 10.0 10.0   Creatinine 0.6 0.7 0.7   Glucose 85 78 84   Calcium 8.7 8.4* 9.2       Microbiology Results (last 15 days)     Procedure Component Value Units Date/Time    Blood Culture Aerobic/Anaerobic #1 [[id number]] Collected: 07/19/19 1735    Order Status: Completed Specimen: Arm from Blood, Venipuncture Updated: 07/20/19 1921    Narrative:      ORDER#: [id number]                                    ORDERED BY: Avanell Shackleton  SOURCE: Blood, Venipuncture arm                      COLLECTED:  07/19/19 17:35  ANTIBIOTICS AT COLL.:                                 RECEIVED :  07/19/19 19:02  Culture Blood Aerobic and Anaerobic        PRELIM  07/20/19 19:21  07/20/19   No Growth after 1 day/s of incubation.      Blood Culture Aerobic/Anaerobic #2 [[id number]] Collected: 07/19/19 1735    Order Status: Completed Specimen: Arm from Blood, Venipuncture Updated: 07/20/19 1921    Narrative:      ORDER#: [id number]                                    ORDERED BY: Avanell Shackleton  SOURCE: Blood, Venipuncture arm                      COLLECTED:  07/19/19 17:35  ANTIBIOTICS AT COLL.:                                RECEIVED :  07/19/19 19:02  Culture Blood Aerobic and Anaerobic        PRELIM      07/20/19 19:21  07/20/19   No Growth after 1 day/s of incubation.      COVID-19 (SARS-COV-2) [facility] Rapid) [[id number]] Collected: 07/19/19 1734    Order Status: Completed Specimen: Nasopharyngeal Swab from Nasopharynx Updated: 07/19/19 1805     Purpose of COVID testing Screening     SARS-CoV-2 Specimen Source Nasopharyngeal     SARS CoV 2 Overall Result Negative     Comment: Test performed using the Abbott ID NOW EUA assay.  Please see Fact Sheets for patients and providers located at:  http://www.rice.biz/    This test is for the qualitative detection of SARS-CoV-2  (COVID19) nucleic acid. Viral nucleic acids may persist in vivo,  independent of viability. Detection of viral nucleic acid does  not imply the presence of infectious virus, or that virus  nucleic acid is the cause of clinical symptoms. Negative  results should be treated as presumptive and, if inconsistent  with clinical signs and symptoms or necessary for patient  management, should be tested with an alternative molecular  assay. Negative results do not preclude SARS-CoV-2 infection  and should not be used as the sole basis for patient  management decisions. Invalid results may be due to inhibiting  substances in the specimen and recollection should occur.         Narrative:      o Collect  and clearly label specimen type:  o Upper respiratory specimen: One Nasopharyngeal Dry Swab NO  Transport Media.  o Hand deliver to laboratory ASAP  Indication for testing->Extended care facility admission to  semi private room  Screening          Imaging:   Tibia Fibula Left AP and Lateral    Result Date: 07/19/2019   No fracture or dislocation. Kinnie Feil, MD  07/19/2019 4:18 PM    Foot Left AP Lateral and Oblique    Result Date: 07/19/2019   Lucent line through the medial first sesamoid. This could represent a bipartite sesamoid or fracture. Clinical correlation Kinnie Feil, MD  07/19/2019 4:17 PM    MRI Foot Left WO Contrast    Result Date: 07/21/2019   1. Limited study secondary motion artifact. 2. Areas of increased T2 signal intensity within the distal phalanges of the third, fourth, fifth toes as described above is nonspecific. However, early osteomyelitis cannot be excluded. Clinical correlation is recommended. 3. Marrow edema of the medial sesamoid bone of the first toe with associated fragmented appearance  suggesting stress reaction within a bipartite sesamoid bone. 4. Tenosynovitis of the flexor hallucis longus tendon as described above. Sandie Ano, MD  07/21/2019 8:33 AM    [name] VenoDopp Low Extremity Left    Result Date: 07/19/2019   Normal venous duplex of the left lower extremity.  No evidence of deep venous thrombosis. Larrie Kass, MD  07/19/2019 4:24 PM       Patient Instructions   Discharge Diet: Regular    Discharge Activity: As tolerated    Follow Up Appointment:  Follow-up Information     Primary Care Clinic. Schedule an appointment as soon as possible for a visit in 1 week(s).    Why: To reassess left leg for resolution of cellulitis            Infectious disease. Schedule an appointment as soon as possible for a visit in 2 week(s).    Why: To reassess if suspected tenosynovitis has resolved and if antibiotic course needs to be adjusted                   Time spent examining patient, discussing with  patient/family regarding hospital course, chart review, reconciling medications and discharge planning: 45 minutes.    Signed,  Teodora Medici MD, MPH  10:41 AM 07/21/2019   Please contact via Secure Chat   SpectraLink: [phone]  Pager: [phone]         This chart was generated using hospital voice-recognition software which does not employ spell-checking or grammar-checking features. It was dictated, all or in part, in a busy and often noisy patient care environment. I have taken all usual measures to dictate carefully and to review all aspects this chart. Nonetheless, given the known and well-documented performance characteristics of VR software in such patient care environments, this dictation still may contain unrecognized and wholly unintended errors or omissions

## 2019-07-21 NOTE — Progress Notes (Signed)
DCP- to friend's house. Friend will transport. Will use GoodRx app for medications. Stated she can afford to pay out of pocket for her mediations.                 Garry Heater, RN MSN       Clinical Case Manager       [facility]        [address] [place], [place] [address]      [email or web address]      [phone]     07/21/19 1139   Discharge Disposition   Patient preference/choice provided? Yes   Physical Discharge Disposition Home   Mode of Transportation Car   Patient/Family/POA notified of transfer plan Patient informed only   Patient agreeable to discharge plan/expected d/c date? Yes   Bedside nurse notified of transport plan? Yes   CM Interventions   Follow up appointment scheduled? No   Reason no follow up scheduled? Other (comment)  (pt to schedule)   Notified MD? Yes   Referral made for home health RN visit? Does not meet home bound criteria   Multidisciplinary rounds/family meeting before d/c? Yes   Medicare Checklist   Is this a Medicare patient? No

## 2019-07-21 NOTE — Progress Notes (Addendum)
Case Management Initial Assessment and Discharge Planning:    Situation Tenosynovitis of ankle   IVDU (intravenous drug user)  Amphetamine abuse  Heroin abuse   Cellulitis and abscess of left leg     Background PMH:  No pertinent past medical history.     Assessment Information provided by pt.  Pt is visiting from [place]. She lives with her friend Ines Bloomer [phone]. Pt is self-care, ambulatory independent with ADLs and IADLs.    No PCP, uninsured.  Pt will return to [place] in couple of days.     Recommendation DCP- to friend's house. Friend will transport. Will use GoodRx app for medications. Stated she can afford to pay out of pocket for her mediations.               Garry Heater, RN MSN       Clinical Case Manager       [facility]        [address], [place], [place] [address]      [email or web address]      [phone]         07/21/19 1131   Patient Type   Within 30 Days of Previous Admission? Yes   Healthcare Decisions   Interviewed: Patient   Orientation/Decision Making Abilities of Patient Alert and Oriented x3, able to make decisions   Prior to admission   Prior level of function Independent with ADLs;Ambulates independently   Type of Residence Other (Comment)  (friend's house)   Have running water, electricity, heat, etc? Yes   Living Arrangements Friends   How do you get to your MD appointments? self   How do you get your groceries? self   Who fixes your meals? self   Who does your laundry? self   Who picks up your prescriptions? self   Dressing Independent   Grooming Independent   Feeding Independent   Bathing Independent   Toileting Independent   DME Currently at Home Other (Comment)  (n/a)   Home Care/Community Services None   Prior SNF admission? (Detail) n/a   Discharge Planning   Support Systems Friends/neighbors   Patient expects to be discharged to: home   Anticipated [place] plan discussed with: Patient   Potential barriers to discharge: No insurance / under insured   Mode of  transportation: Private car (family member)   Does the patient have perscription coverage? Yes   Consults/Providers   PT Evaluation Needed 2   OT Evalulation Needed 2   SLP Evaluation Needed 2   Outcome Palliative Care Screen Screened but did not meet criteria for intervention   Correct PCP listed in Epic? No (comment)   Family and PCP   PCP on file was verified as the current PCP? Patient/family states they do not have a PCP   Important Message from Medicare Notice   Patient received 1st IMM Letter? n/a

## 2019-07-21 NOTE — Plan of Care (Signed)
Pt is A&O x4. Denies pain.  Pt slept through the night.   Left leg is elevated, scheduled meds given.  Call button and bedside table within reach. Purposeful rounding completed.  Problem: Compromised Tissue integrity  Goal: Damaged tissue is healing and protected  Outcome: Progressing  Flowsheets (Taken 07/21/2019 0247)  Damaged tissue is healing and protected:   Increase activity as tolerated/progressive mobility   Relieve pressure to bony prominences for patients at moderate and high risk   Avoid shearing injuries   Keep intact skin clean and dry   Use bath wipes, not soap and water, for daily bathing   Use incontinence wipes for cleaning urine, stool and caustic drainage. Foley care as needed   Encourage use of lotion/moisturizer on skin   Monitor external devices/tubes for correct placement to prevent pressure, friction and shearing

## 2019-07-21 NOTE — UM Notes (Addendum)
CONCURRENT REVIEW July 21, 2019      Upgraded Admit to Inpatient (Order [id number])  Diagnosis: Cellulitis And Abscess Of Left Leg    Level of Care: Acute    Patient Class: Inpatient       References: IAH Bed Placement CriteriaIFMC Bed Placement CriteriaIFOH Bed Placement CriteriaILH Bed Placement CriteriaIMVH Bed Placement Criteria   Question Answer Comment   Admitting Physician Marya Amsler    Service: Medicine    Estimated Length of Stay > or = to 2 midnights    Tentative Discharge Plan? Home or Self Care    Does patient need telemetry? No               PATIENT NAME: ATISHA, HAMIDI ANN  DOB: 1990/11/24      History of present illness: Pt is a 29 y.o. female arrived at [facility] on 07/19/2019 at 1630.and admitted to 23N    BP 117/78    Pulse 89    Temp 99 F (37.2 C) (Oral)    Resp 17    Ht 1.554 m (5' 1.2")    Wt 50.3 kg (111 lb)    LMP  (LMP Unknown)    SpO2 97%    BMI 20.97 kg/m     Temp:  [97.5 F (36.4 C)-99 F (37.2 C)]   Heart Rate:  [75-98]   Resp Rate:  [16-18]   BP: (114-119)/(62-80)   SpO2:  [95 %-100 %]   Weight:  [50.3 kg (111 lb)]       Abnormal Labs:   Lab Results last 48 Hours     Procedure Component Value Units Date/Time    CBC and differential [[id number]]  (Abnormal) Collected: 07/21/19 0342     Monocytes Absolute Automated 0.90 x10 3/uL     UA, Reflex to Microscopic (pts 3 + yrs) [[id number]]  (Abnormal) Collected: 07/20/19 0314     Clarity, UA Sl Cloudy     Leukocyte Esterase, UA Large     Nitrite, UA Positive     Protein, UR 30     Ketones UA Trace     WBC, UA TNTC /hpf     Rapid drug screen, urine [[id number]]  (Abnormal) Collected: 07/20/19 0314    Specimen: Urine Updated: 07/20/19 0518     Urine Amphetamine Screen Positive     Cannabinoid Screen, UR Positive    Basic Metabolic Panel [[id number]]  (Abnormal) Collected: 07/20/19 0314     Calcium 8.4 mg/dL      Sodium 244 mEq/L     CBC and differential [[id number]]  (Abnormal) Collected: 07/20/19 0314     MCHC 31.0 g/dL      Monocytes  Absolute Automated 0.98 x10 3/uL     Comprehensive metabolic panel [[id number]]  (Abnormal) Collected: 07/19/19 1734     AST (SGOT) 46 U/L      Alkaline Phosphatase 107 U/L      Globulin 4.4 g/dL      Albumin/Globulin Ratio 0.8    Hemolysis index [[id number]]  (Abnormal) Collected: 07/19/19 1734     Updated: 07/19/19 1802     Hemolysis Index 23    CBC and differential [[id number]]  (Abnormal) Collected: 07/19/19 1734     MCHC 31.4 g/dL      Monocytes Absolute Automated 0.95 x10 3/uL           MD Notes:  ID note 07/20/2019  Assessment  1.  Left lower extremity cellulitis possibly secondary to mild trauma but no evidence of skin popping in  the leg  Recommendations  1.  Check blood cultures  2.  Vancomycin        Patient: Etty Isaac  Admission Date: 07/19/2019   DOB: [birth year]  Discharge Date: 07/21/2019      DISCHARGE SUMMARY     Discharge Information   Admission Diagnosis:   Cellulitis and abscess of left leg    Discharge Diagnosis:       Active Hospital Problems    Diagnosis    Tenosynovitis of ankle    IVDU (intravenous drug user)    Amphetamine abuse    Heroin abuse    Cellulitis and abscess of left leg     Admission Condition: Left leg pain and swelling due to cellulitis and tenosynovitis  Discharge Condition: Improving  Consultants: Infectious disease  Functional Status: Ambulatory  Discharged to: [facility] Course (1 Days)   Patient Active Hospital Problem List:   Cellulitis and abscess of left leg (07/19/2019) associated with tenosynovitis of ankle (07/21/2019)  Assessment: Improvement after left leg erythema and pain since admission;continues to have some swelling and erythema; MRI of the left foot confirms tenosynovitis of the flexor hallucis longus tendon. Also noted to have evidence of tinea pedis.Patient denies any injection to the left leg.  Plan: Switched from vancomycin 750 mg IV q12h to doxycycline 100 mg twice daily or a total of 3 weeks  ID consult appreciated  Recommend  clotrimazole cream 1% twice daily to be applied to the left foot  Follow-up with primary care and infectious disease in 1 to 2 week  IVDU (intravenous drug user) (07/20/2019)  Amphetamine abuse (07/20/2019)  Heroin abuse (07/20/2019)  Assessment: wantsto talk about drugrehab options;last use was3 days ago; patient appears precontemplative about quitting at this time.  She is planning on getting enrolled in a long-term drug rehab starting November 2021  Plan: Medically cleared for discharge  Patient is not keen on methadone program at this time      Medications: Scheduled Meds:  Current Facility-Administered Medications   Medication Dose Route Frequency    clotrimazole   Topical BID    enoxaparin  40 mg Subcutaneous Daily    methadone  20 mg Oral Daily    vancomycin  750 mg Intravenous Q12H    vancomycin   Intravenous See Admin Instructions     Continuous Infusions:  PRN Meds:.acetaminophen **OR** acetaminophen, Nursing communication: Adult Hypoglycemia Treatment Algorithm **AND** dextrose **AND** dextrose **AND** glucagon (rDNA), ketorolac, melatonin, morphine, naloxone, ondansetron **OR** ondansetron, oxyCODONE      This clinical review is based on compiled documentation provided by the treatment team within the patient's medical record.    UTILIZATION REVIEW CONTACT : Eddie North BSN, RN, ACM  Utilization Review Case Manager II / Case Management  [name]  [address]  [place], [place] [address]  T [phone]  Judie Petit [phone]   F [phone]    Email: [email or web address]  NPI: [id number] Tax ID:  [id number]

## 2019-07-21 NOTE — Discharge Instructions (Signed)
Tendonitis and Tenosynovitis  What are tendonitis and tenosynovitis?  Tendons are strong cords of tissue that connect muscles to bones. When a tendon is inflamed, it's called tendonitis. It can happen to any tendon in the body. An inflamed tendon can cause swelling, pain, and discomfort.   Another problem called tenosynovitis is linked to tendonitis. This is the inflammation of the lining of the tendon sheath around a tendon. Often the sheath itself is inflamed, but both the sheath and the tendon can be inflamed at the same time.   Common types of these tendon problems include:   Lateral epicondylitis. This is most often known as tennis elbow. It causes pain to the side of the elbow and forearm, along the thumb side of the arm. The pain is caused by damage to the tendons that bend the wrist back and away from the palm.   Medial epicondylitis. This is most often known as golfer's or baseball elbow. It causes pain from the elbow to the wrist on the palm side of the forearm. The pain is caused by damage to the tendons that bend the wrist toward the palm.   Rotator cuff tendonitis. This is a shoulder disorder. It causes inflammation of the shoulder capsule and related tendons.   DeQuervain tenosynovitis. This is a common tenosynovitis disorder. It causes swelling in the tendon sheath of the tendons of the thumb.   Trigger finger or trigger thumb. This is a type of tenosynovitis. The tendon sheath becomes inflamed and thickened. This makes it hard to extend or flex the finger or thumb. The finger or thumb may lock or "trigger" suddenly.  What causes tendonitis and tenosynovitis?  The cause of tendonitis and tenosynovitis is often not known. They may be caused by strain, overuse, injury, or too much exercise. They may also be linked to a disease such as diabetes, rheumatoid arthritis, or infection.   What are the symptoms of tendonitis and tenosynovitis?  Symptoms may include:   Pain in the tendon when  moved   Swelling from fluid and inflammation   A grating feeling when moving the joint  The symptoms of tendonitis can seem like other health problems. Talk with your healthcare provider for a diagnosis.   How are tendonitis and tenosynovitis diagnosed?  Your healthcare provider will ask about your health history and give you a physical exam. You may have tests to check for other problems that may be causing your symptoms. The tests may include:    Joint aspiration. The healthcare provider uses a needle to take a small amount of fluid from the joint. The fluid is tested to check for gout or signs of an infection.   X-ray. A small amount of radiation is used to make an image. Tendons cant be seen on an X-ray, but they can show bone. This test can check for arthritis, calcifications, and other problems.  How are tendonitis and tenosynovitis treated?  Treatment may include:   Changing your activities   Icing the area to reduce inflammation and pain. To make a cold pack, put ice cubes in a plastic bag that seals at the top. Wrap the bag in a clean, thin towel or cloth.   Putting a splint on the area to limit movement   Steroid injections to reduce inflammation and pain   Nonsteroidal anti-inflammatory drugs (called NSAIDs) to reduce inflammation and pain   Antibiotics if due to infection   Surgery if other treatments don't work  Key points about tendonitis and  tenosynovitis   Tendonitis is when a tendon is inflamed. It can cause swelling, pain, and discomfort.   Another problem called tenosynovitis is linked to tendonitis. This is the inflammation of the lining of the tendon sheath around a tendon.   Common types of tendon problems include rotator cuff tendonitis and trigger finger or trigger thumb.   Tendonitis can be caused by strain, overuse, injury, and too much exercise.   Treatment may include changing your activities, icing the area to reduce pain, and using a splint to limit movement.    Next  steps  Tips to help you get the most from a visit to your healthcare provider:    Know the reason for your visit and what you want to happen.   Before your visit, write down questions you want answered.   Bring someone with you to help you ask questions and remember what your provider tells you.   At the visit, write down the name of a new diagnosis, and any new medicines, treatments, or tests. Also write down any new instructions your provider gives you.   Know why a new medicine or treatment is prescribed, and how it will help you. Also know what the side effects are.   Ask if your condition can be treated in other ways.   Know why a test or procedure is recommended and what the results could mean.   Know what to expect if you do not take the medicine or have the test or procedure.   If you have a follow-up appointment, write down the date, time, and purpose for that visit.   Know how you can contact your provider if you have questions.  [employer] last reviewed this educational content on 01/14/2019   2000-2021 [employer], [place]. All rights reserved. This information is not intended as a substitute for professional medical care. Always follow your healthcare professional's instructions.      Doxycycline Hyclate Oral Tablet 100 mg  Uses  For treating bacterial infection.  Instructions  Take the medicine with 250 mL (1 cup) of water.  Take on empty stomach - 1 hour before or 2 hours after eating.  Sit or stand upright for 30 minutes after taking the medicine. Do not lie down.  Keep the medicine at room temperature. Avoid heat and direct light.  Do not take any antacid or vitamins with magnesium, calcium, aluminum, or iron for 2 hours before and 2 hours after taking this medicine.  This medicine may cause you to become more sensitive to the sun. Use sunscreen or wear protective clothing when you are exposed to the sun.  It is important that you keep taking each dose of this medicine on time even if  you are feeling well.  If you forget to take a dose on time, take it as soon as you remember. If it is almost time for the next dose, do not take the missed dose. Return to your normal dosing schedule. Do not take 2 doses of this medicine at one time.  Please tell your doctor and pharmacist about all the medicines you take. Include both prescription and over-the-counter medicines. Also tell them about any vitamins, herbal medicines, or anything else you take for your health.  It is very important that you continue using this medicine for the full number of days that it is prescribed. Please do not stop the medicine even if you start to feel better after the first few days.  This medicine can  cause permanent change in teeth color in children.  Cautions  Tell your doctor and pharmacist if you ever had an allergic reaction to a medicine. Symptoms of an allergic reaction can include trouble breathing, skin rash, itching, swelling, or severe dizziness.  Do not use the medication any more than instructed.  Contact your doctor if you notice a change in the amount or darkening of your urine.  Please tell your doctor if you have moderate to severe diarrhea while on this medicine. Do not treat the diarrhea with over-the-counter diarrhea medicine.  Tell the doctor or pharmacist if you are pregnant, planning to be pregnant, or breastfeeding.  Ask your pharmacist if this medicine can interact with any of your other medicines. Be sure to tell them about all the medicines you take.  Please tell all your doctors and dentists that you are on this medicine before they provide care.  Do not start or stop any other medicines without first speaking to your doctor or pharmacist.  Do not share this medicine with anyone who has not been prescribed this medicine.  Side Effects  The following is a list of some common side effects from this medicine. Please speak with your doctor about what you should do if you experience these or other side  effects.   diarrhea   nausea   stomach upset or abdominal pain   increased risk of sunburn   yeast infection of mouth   vaginal itching or yeast infection   vomiting  Call your doctor or get medical help right away if you notice any of these more serious side effects:   swelling in the neck or throat   difficulty swallowing   blurring or changes of vision  A few people may have an allergic reactions to this medicine. Symptoms can include difficulty breathing, skin rash, itching, swelling, or severe dizziness. If you notice any of these symptoms, seek medical help quickly.  Extra  Please speak with your doctor, nurse, or pharmacist if you have any questions about this medicine.  [email or web address]  IMPORTANT NOTE: This document tells you briefly how to take your medicine, but it does not tell you all there is to know about it.Your doctor or pharmacist may give you other documents about your medicine. Please talk to them if you have any questions.Always follow their advice. There is a more complete description of this medicine available in [place].Scan this code on your smartphone or tablet or use the web address below. You can also ask your pharmacist for a printout. If you have any questions, please ask your pharmacist.    2021 [employer]

## 2019-07-21 NOTE — Plan of Care (Signed)
Problem: Moderate/High Fall Risk Score >5  Goal: Patient will remain free of falls  Outcome: Progressing  Flowsheets (Taken 07/20/2019 1617 by Mirian Capuchin, RN)  High (Greater than 13):   LOW-Anticoagulation education for injury risk   LOW-Fall Interventions Appropriate for Low Fall Risk   MOD-Use of chair-pad alarm when appropriate     Problem: Pain  Goal: Pain at adequate level as identified by patient  Outcome: Progressing  Flowsheets (Taken 07/19/2019 2344 by Manley Mason, RN)  Pain at adequate level as identified by patient:   Identify patient comfort function goal   Assess for risk of opioid induced respiratory depression, including snoring/sleep apnea. Alert healthcare team of risk factors identified.   Assess pain on admission, during daily assessment and/or before any "as needed" intervention(s)   Reassess pain within 30-60 minutes of any procedure/intervention, per Pain Assessment, Intervention, Reassessment (AIR) Cycle   Evaluate if patient comfort function goal is met   Evaluate patient's satisfaction with pain management progress   Offer non-pharmacological pain management interventions   Consult/collaborate with Pain Service     Problem: Psychosocial and Spiritual Needs  Goal: Demonstrates ability to cope with hospitalization/illness  Outcome: Progressing  Flowsheets (Taken 07/21/2019 1349)  Demonstrates ability to cope with hospitalizations/illness:   Encourage verbalization of feelings/concerns/expectations   Provide quiet environment   Assist patient to identify own strengths and abilities   Reinforce positive adaptation of new coping behaviors   Encourage participation in diversional activity   Encourage patient to set small goals for self   Include patient/ patient care companion in decisions   Communicate referral to spiritual care as appropriate

## 2019-11-03 ENCOUNTER — Inpatient Hospital Stay (HOSPITAL_COMMUNITY)
Admission: EM | Admit: 2019-11-03 | Discharge: 2019-11-05 | DRG: 871 | Discharge status: Against Medical Advice | Payer: Self-pay | Attending: Internal Medicine | Admitting: Internal Medicine

## 2019-11-03 ENCOUNTER — Inpatient Hospital Stay (HOSPITAL_COMMUNITY): Payer: Self-pay

## 2019-11-03 ENCOUNTER — Emergency Department (HOSPITAL_COMMUNITY): Payer: Self-pay

## 2019-11-03 ENCOUNTER — Encounter (HOSPITAL_COMMUNITY): Payer: Self-pay | Admitting: Emergency Medicine

## 2019-11-03 ENCOUNTER — Other Ambulatory Visit (HOSPITAL_COMMUNITY): Payer: Medicaid Other

## 2019-11-03 DIAGNOSIS — B1920 Unspecified viral hepatitis C without hepatic coma: Secondary | ICD-10-CM | POA: Diagnosis not present

## 2019-11-03 DIAGNOSIS — A4102 Sepsis due to Methicillin resistant Staphylococcus aureus: Principal | ICD-10-CM | POA: Diagnosis present

## 2019-11-03 DIAGNOSIS — I071 Rheumatic tricuspid insufficiency: Secondary | ICD-10-CM | POA: Diagnosis present

## 2019-11-03 DIAGNOSIS — Z20822 Contact with and (suspected) exposure to covid-19: Secondary | ICD-10-CM | POA: Diagnosis present

## 2019-11-03 DIAGNOSIS — Z5329 Procedure and treatment not carried out because of patient's decision for other reasons: Secondary | ICD-10-CM | POA: Diagnosis present

## 2019-11-03 DIAGNOSIS — Z6820 Body mass index (BMI) 20.0-20.9, adult: Secondary | ICD-10-CM

## 2019-11-03 DIAGNOSIS — Z87891 Personal history of nicotine dependence: Secondary | ICD-10-CM

## 2019-11-03 DIAGNOSIS — I2690 Septic pulmonary embolism without acute cor pulmonale: Secondary | ICD-10-CM | POA: Diagnosis present

## 2019-11-03 DIAGNOSIS — I5189 Other ill-defined heart diseases: Secondary | ICD-10-CM

## 2019-11-03 DIAGNOSIS — R7881 Bacteremia: Secondary | ICD-10-CM

## 2019-11-03 DIAGNOSIS — E861 Hypovolemia: Secondary | ICD-10-CM | POA: Diagnosis not present

## 2019-11-03 DIAGNOSIS — E222 Syndrome of inappropriate secretion of antidiuretic hormone: Secondary | ICD-10-CM | POA: Diagnosis present

## 2019-11-03 DIAGNOSIS — F1110 Opioid abuse, uncomplicated: Secondary | ICD-10-CM | POA: Diagnosis present

## 2019-11-03 DIAGNOSIS — I76 Septic arterial embolism: Secondary | ICD-10-CM | POA: Diagnosis present

## 2019-11-03 DIAGNOSIS — L03116 Cellulitis of left lower limb: Secondary | ICD-10-CM | POA: Diagnosis present

## 2019-11-03 DIAGNOSIS — B9562 Methicillin resistant Staphylococcus aureus infection as the cause of diseases classified elsewhere: Secondary | ICD-10-CM

## 2019-11-03 DIAGNOSIS — J189 Pneumonia, unspecified organism: Secondary | ICD-10-CM | POA: Diagnosis present

## 2019-11-03 DIAGNOSIS — E43 Unspecified severe protein-calorie malnutrition: Secondary | ICD-10-CM | POA: Diagnosis present

## 2019-11-03 DIAGNOSIS — R64 Cachexia: Secondary | ICD-10-CM | POA: Diagnosis present

## 2019-11-03 DIAGNOSIS — N179 Acute kidney failure, unspecified: Secondary | ICD-10-CM | POA: Diagnosis present

## 2019-11-03 DIAGNOSIS — I330 Acute and subacute infective endocarditis: Secondary | ICD-10-CM | POA: Diagnosis present

## 2019-11-03 DIAGNOSIS — A419 Sepsis, unspecified organism: Secondary | ICD-10-CM | POA: Diagnosis present

## 2019-11-03 DIAGNOSIS — M659 Synovitis and tenosynovitis, unspecified: Secondary | ICD-10-CM

## 2019-11-03 DIAGNOSIS — E871 Hypo-osmolality and hyponatremia: Secondary | ICD-10-CM

## 2019-11-03 DIAGNOSIS — F1910 Other psychoactive substance abuse, uncomplicated: Secondary | ICD-10-CM

## 2019-11-03 HISTORY — DX: Opioid abuse, uncomplicated: F11.10

## 2019-11-03 LAB — COMPREHENSIVE METABOLIC PANEL
ALT: 36 U/L (ref 0–44)
AST: 84 U/L — ABNORMAL HIGH (ref 15–41)
Albumin: 2.2 g/dL — ABNORMAL LOW (ref 3.5–5.0)
Alkaline Phosphatase: 133 U/L — ABNORMAL HIGH (ref 38–126)
Anion gap: 15 (ref 5–15)
BUN: 39 mg/dL — ABNORMAL HIGH (ref 6–20)
CO2: 22 mmol/L (ref 22–32)
Calcium: 8 mg/dL — ABNORMAL LOW (ref 8.9–10.3)
Chloride: 86 mmol/L — ABNORMAL LOW (ref 98–111)
Creatinine, Ser: 1.33 mg/dL — ABNORMAL HIGH (ref 0.44–1.00)
GFR, Estimated: 56 mL/min — ABNORMAL LOW (ref >60)
Glucose, Bld: 92 mg/dL (ref 70–99)
Potassium: 4.1 mmol/L (ref 3.5–5.1)
Sodium: 123 mmol/L — ABNORMAL LOW (ref 135–145)
Total Bilirubin: 1 mg/dL (ref 0.3–1.2)
Total Protein: 7.5 g/dL (ref 6.5–8.1)

## 2019-11-03 LAB — CBC WITH DIFFERENTIAL/PLATELET
Abs Immature Granulocytes: 0.18 10*3/uL — ABNORMAL HIGH (ref 0.00–0.07)
Basophils Absolute: 0.1 10*3/uL (ref 0.0–0.1)
Basophils Relative: 1 %
Eosinophils Absolute: 0 10*3/uL (ref 0.0–0.5)
Eosinophils Relative: 0 %
HCT: 35.6 % — ABNORMAL LOW (ref 36.0–46.0)
Hemoglobin: 12 g/dL (ref 12.0–15.0)
Immature Granulocytes: 2 %
Lymphocytes Relative: 5 %
Lymphs Abs: 0.6 10*3/uL — ABNORMAL LOW (ref 0.7–4.0)
MCH: 26.7 pg (ref 26.0–34.0)
MCHC: 33.7 g/dL (ref 30.0–36.0)
MCV: 79.3 fL — ABNORMAL LOW (ref 80.0–100.0)
Monocytes Absolute: 0.5 10*3/uL (ref 0.1–1.0)
Monocytes Relative: 5 %
Neutro Abs: 9.3 10*3/uL — ABNORMAL HIGH (ref 1.7–7.7)
Neutrophils Relative %: 87 %
Platelets: 173 10*3/uL (ref 150–400)
RBC: 4.49 MIL/uL (ref 3.87–5.11)
RDW: 14.4 % (ref 11.5–15.5)
WBC: 10.7 10*3/uL — ABNORMAL HIGH (ref 4.0–10.5)
nRBC: 0 % (ref 0.0–0.2)

## 2019-11-03 LAB — URINALYSIS, ROUTINE W REFLEX MICROSCOPIC
Bilirubin Urine: NEGATIVE
Glucose, UA: NEGATIVE mg/dL
Hgb urine dipstick: NEGATIVE
Ketones, ur: NEGATIVE mg/dL
Nitrite: NEGATIVE
Protein, ur: NEGATIVE mg/dL
Specific Gravity, Urine: 1.017 (ref 1.005–1.030)
WBC, UA: >50 WBC/hpf — ABNORMAL HIGH (ref 0–5)
pH: 5 (ref 5.0–8.0)

## 2019-11-03 LAB — I-STAT BETA HCG BLOOD, ED (MC, WL, AP ONLY): I-stat hCG, quantitative: <5 m[IU]/mL (ref <5)

## 2019-11-03 LAB — TSH: TSH: 0.402 u[IU]/mL (ref 0.350–4.500)

## 2019-11-03 LAB — CK: Total CK: 90 U/L (ref 38–234)

## 2019-11-03 LAB — LACTIC ACID, PLASMA
Lactic Acid, Venous: 2.9 mmol/L (ref 0.5–1.9)
Lactic Acid, Venous: 1.7 mmol/L (ref 0.5–1.9)
Lactic Acid, Venous: 1.6 mmol/L (ref 0.5–1.9)
Lactic Acid, Venous: 1.7 mmol/L (ref 0.5–1.9)

## 2019-11-03 LAB — RENAL FUNCTION PANEL
Albumin: 2.2 g/dL — ABNORMAL LOW (ref 3.5–5.0)
Anion gap: 17 — ABNORMAL HIGH (ref 5–15)
BUN: 39 mg/dL — ABNORMAL HIGH (ref 6–20)
CO2: 22 mmol/L (ref 22–32)
Calcium: 8.2 mg/dL — ABNORMAL LOW (ref 8.9–10.3)
Chloride: 89 mmol/L — ABNORMAL LOW (ref 98–111)
Creatinine, Ser: 1.33 mg/dL — ABNORMAL HIGH (ref 0.44–1.00)
GFR, Estimated: 56 mL/min — ABNORMAL LOW (ref >60)
Glucose, Bld: 88 mg/dL (ref 70–99)
Phosphorus: 4.2 mg/dL (ref 2.5–4.6)
Potassium: 4.2 mmol/L (ref 3.5–5.1)
Sodium: 128 mmol/L — ABNORMAL LOW (ref 135–145)

## 2019-11-03 LAB — RESPIRATORY PANEL BY RT PCR (FLU A&B, COVID)
Influenza A by PCR: NEGATIVE
Influenza B by PCR: NEGATIVE
SARS Coronavirus 2 by RT PCR: NEGATIVE

## 2019-11-03 LAB — PROTIME-INR
INR: 1.3 — ABNORMAL HIGH (ref 0.8–1.2)
Prothrombin Time: 15.8 seconds — ABNORMAL HIGH (ref 11.4–15.2)

## 2019-11-03 LAB — APTT: aPTT: 32 seconds (ref 24–36)

## 2019-11-03 LAB — TROPONIN I (HIGH SENSITIVITY): Troponin I (High Sensitivity): 14 ng/L (ref <18)

## 2019-11-03 IMAGING — DX DG CHEST 1V PORT
1 series · 1 of 1 positions shown · non-contrast
Comparison: [DATE]

CLINICAL DATA: Sepsis, IV drug use

EXAM:
PORTABLE CHEST 1 VIEW

[chest ap]
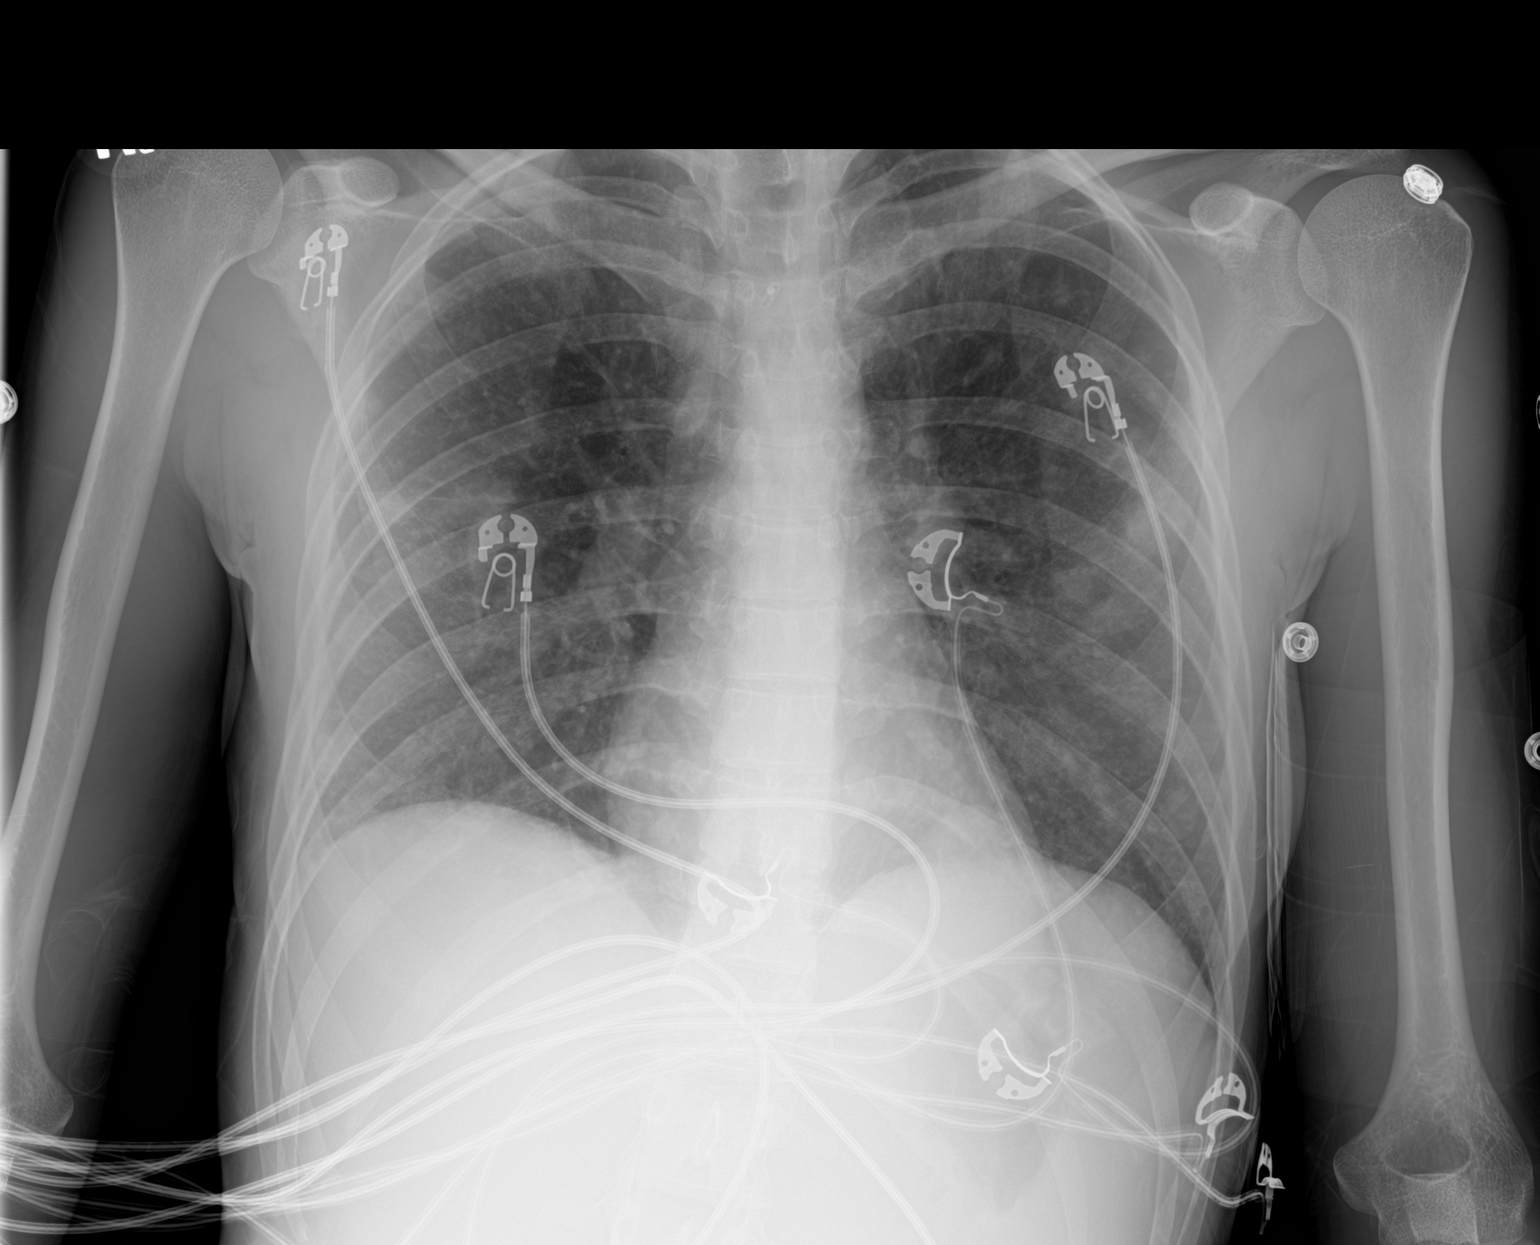

[1 of 1 positions shown; findings below may reference images not displayed]

FINDINGS: Since the prior examination, there have developed multiple
peripherally distributed pulmonary nodules within the lungs
bilaterally. Given the patient's history of drug use, septic
embolization should be considered. Atypical infection, such as
fungal pneumonia, or metastatic disease are considered less likely,
but could present similarly. No confluent pulmonary infiltrates.
Cardiac size within normal limits. Pulmonary vascularity is normal.
No acute bone abnormality.
IMPRESSION: Interval development of multiple pulmonary nodules. This could be
better assessed with CT examination, if indicated.

## 2019-11-03 IMAGING — CT CT ANGIO CHEST
2 of 6 series · 18 of 36 positions shown · IV contrast (omnipaque)
Comparison: None.

CLINICAL DATA: Hypotensive, heroin user

EXAM:
CT ANGIOGRAPHY CHEST WITH CONTRAST
TECHNIQUE: Multidetector CT imaging of the chest was performed using the
standard protocol during bolus administration of intravenous
contrast. Multiplanar CT image reconstructions and MIPs were
obtained to evaluate the vascular anatomy.
CONTRAST:  100mL OMNIPAQUE IOHEXOL 350 MG/ML SOLN

[Series 12: thins · axial · 0.62mm/px · z∈[+1373,+1608]mm · 17 of 265 slices shown]
[im 15/265  lung]
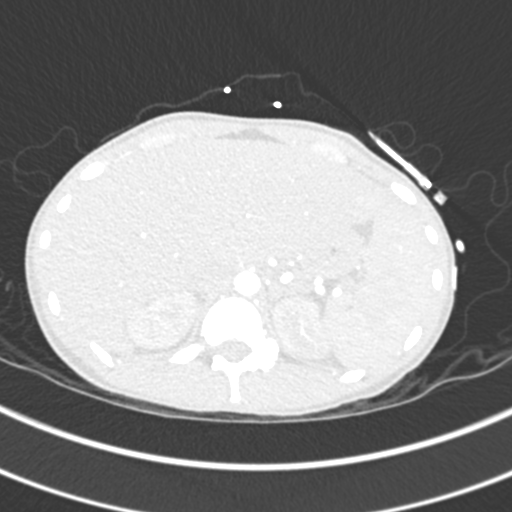
[im 30/265  mediastinal]
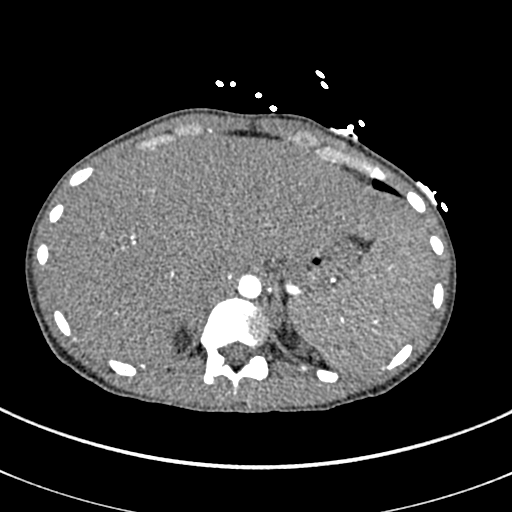
[im 45/265  lung]
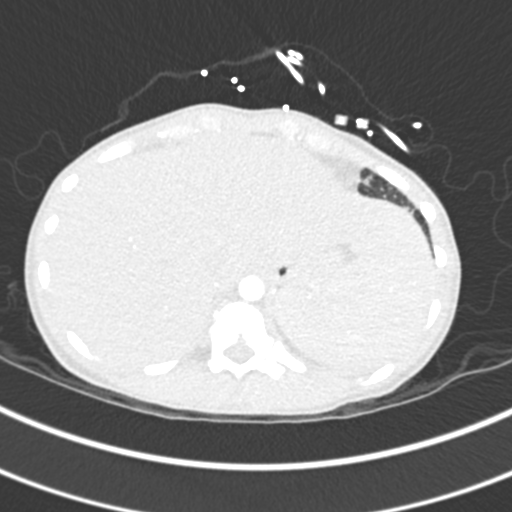
[im 59/265  mediastinal]
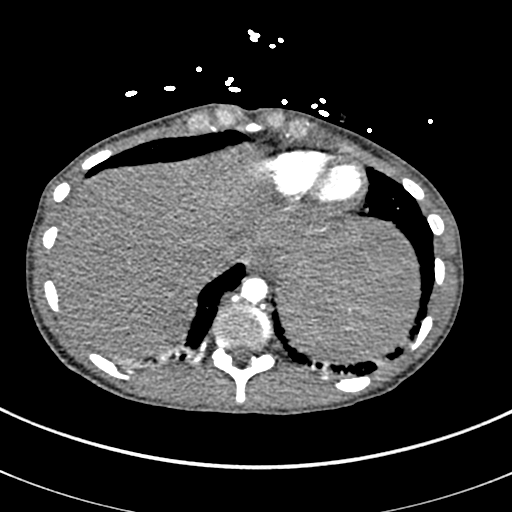
[im 74/265  lung]
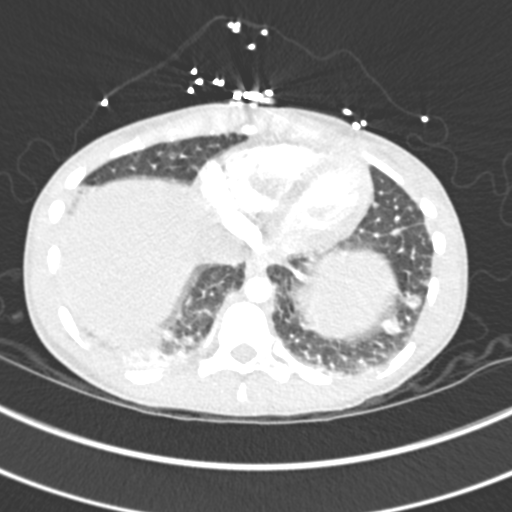
[im 89/265  mediastinal]
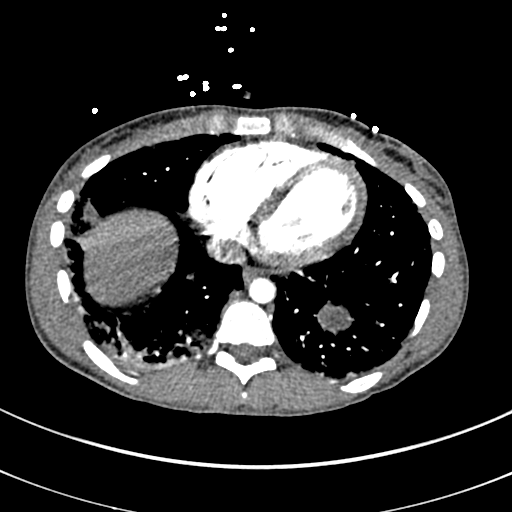
[im 103/265  lung]
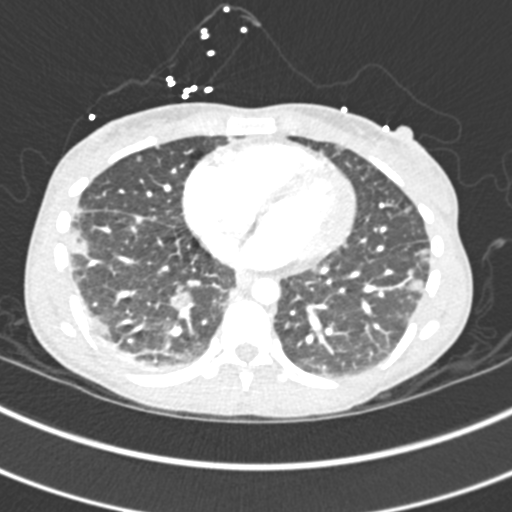
[im 118/265  mediastinal]
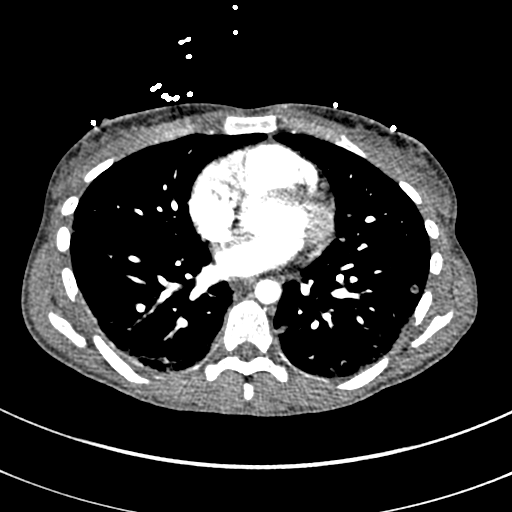
[im 133/265  lung]
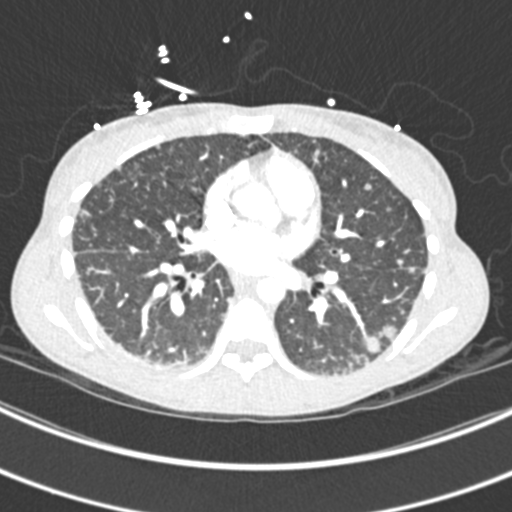
[im 147/265  mediastinal]
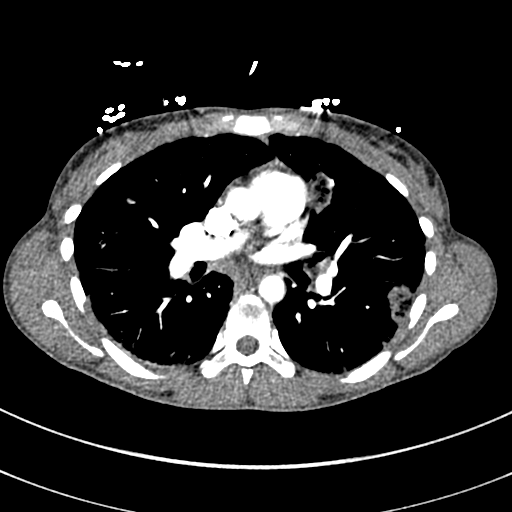
[im 162/265  lung]
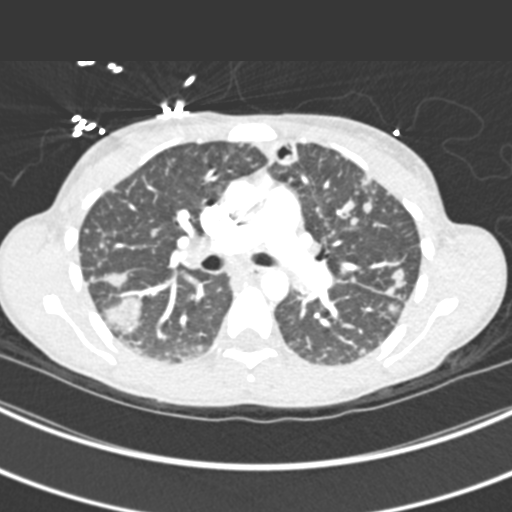
[im 177/265  mediastinal]
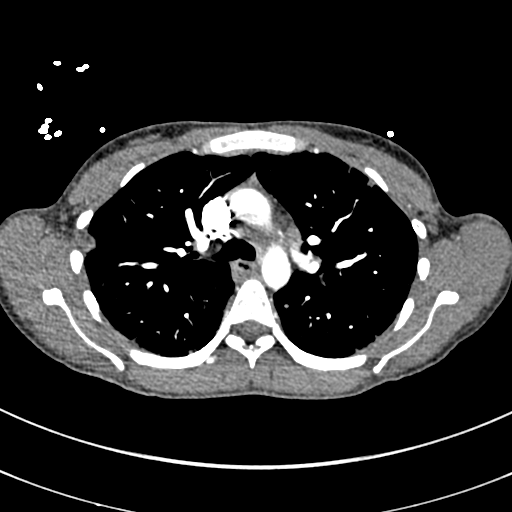
[im 191/265  lung]
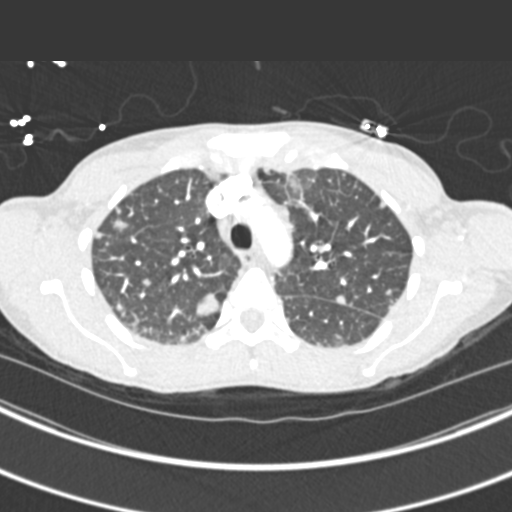
[im 206/265  mediastinal]
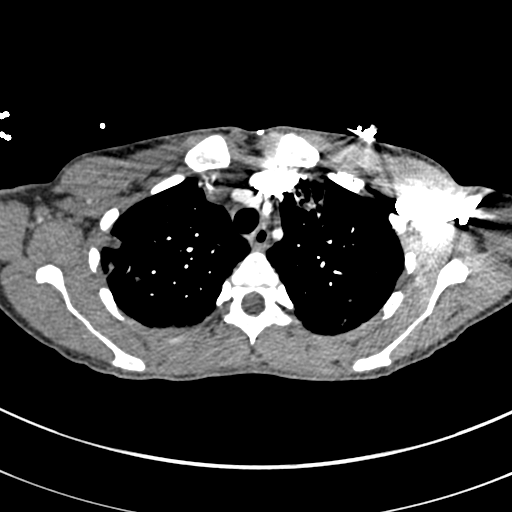
[im 221/265  lung]
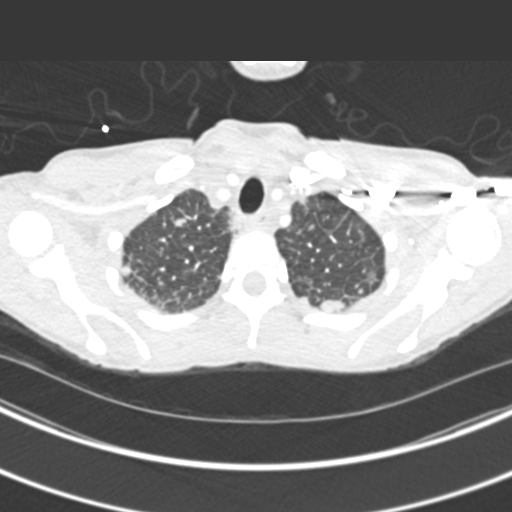
[im 235/265  mediastinal]
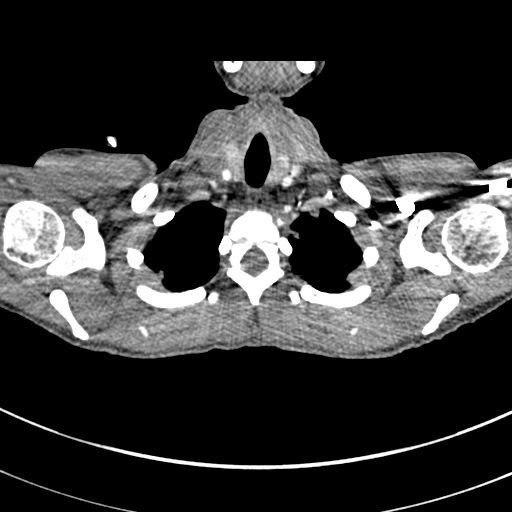
[im 250/265  lung]
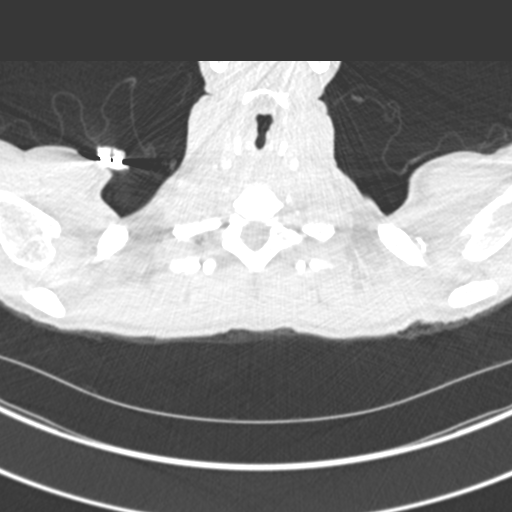

[Series 14: coronal mpr · coronal · 0.52mm/px · 1 of 100 slices shown]
[im 50/100  mediastinal]
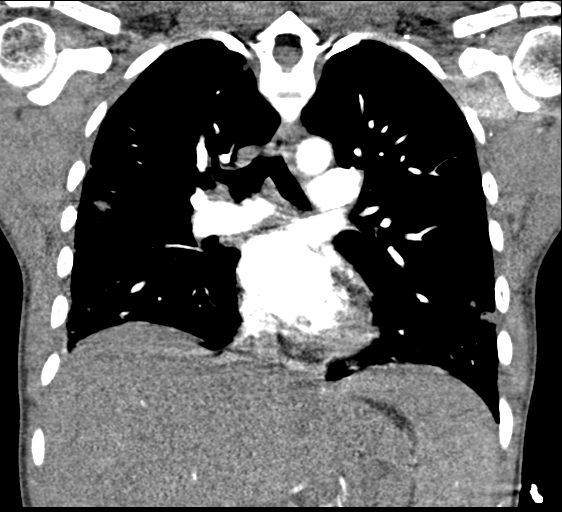

[18 of 36 positions shown; findings below may reference images not displayed]

FINDINGS: Cardiovascular: Satisfactory opacification of the pulmonary arteries
to the segmental level. No evidence of pulmonary embolism. Normal
heart size. No pericardial effusion. Thoracic aorta is normal in
caliber.

Mediastinum/Nodes: Mildly enlarged mediastinal and hilar lymph nodes
are likely reactive. Thyroid and esophagus are unremarkable.

Lungs/Pleura: Numerous nodules are present bilaterally with some
demonstrating internal cavitation. Mild interlobular septal
thickening. Mild bibasilar atelectasis. No pleural effusion or
pneumothorax.

Upper Abdomen: No acute abnormality.

Musculoskeletal: Osseous structures are unremarkable.

Review of the MIP images confirms the above findings.
IMPRESSION: No acute pulmonary embolism.

Pulmonary findings most consistent with septic emboli. Minor
interstitial pulmonary edema.

These results were called by telephone at the time of interpretation
on [DATE] at [DATE] to provider LAAOUINA , who verbally
acknowledged these results.

## 2019-11-03 MED ORDER — LACTATED RINGERS IV BOLUS (SEPSIS)
1000.0000 mL | Freq: Once | INTRAVENOUS | Status: AC
  Administered 2019-11-03 (×2): 1000 mL via INTRAVENOUS

## 2019-11-03 MED ORDER — SODIUM CHLORIDE 0.9 % IV SOLN
2.0000 g | Freq: Two times a day (BID) | INTRAVENOUS | Status: DC
  Administered 2019-11-03: 2 g via INTRAVENOUS
  Filled 2019-11-03 (×3): qty 2

## 2019-11-03 MED ORDER — ACETAMINOPHEN 325 MG PO TABS
650.0000 mg | ORAL_TABLET | Freq: Four times a day (QID) | ORAL | Status: DC | PRN
  Administered 2019-11-03 – 2019-11-04 (×5): 650 mg via ORAL
  Filled 2019-11-03 (×5): qty 2

## 2019-11-03 MED ORDER — ENOXAPARIN SODIUM 40 MG/0.4ML ~~LOC~~ SOLN
40.0000 mg | SUBCUTANEOUS | Status: DC
  Administered 2019-11-03 – 2019-11-04 (×2): 40 mg via SUBCUTANEOUS
  Filled 2019-11-03 (×2): qty 0.4

## 2019-11-03 MED ORDER — OXYCODONE-ACETAMINOPHEN 5-325 MG PO TABS
1.0000 | ORAL_TABLET | Freq: Four times a day (QID) | ORAL | Status: DC | PRN
  Administered 2019-11-04: 1 via ORAL
  Filled 2019-11-03 (×2): qty 1

## 2019-11-03 MED ORDER — METHOCARBAMOL 500 MG PO TABS
500.0000 mg | ORAL_TABLET | Freq: Three times a day (TID) | ORAL | Status: DC | PRN
  Administered 2019-11-03 – 2019-11-04 (×2): 500 mg via ORAL
  Filled 2019-11-03 (×2): qty 1

## 2019-11-03 MED ORDER — CYCLOBENZAPRINE HCL 10 MG PO TABS
5.0000 mg | ORAL_TABLET | Freq: Once | ORAL | Status: DC
  Filled 2019-11-03: qty 1

## 2019-11-03 MED ORDER — SODIUM CHLORIDE 0.9 % IV BOLUS
1000.0000 mL | Freq: Once | INTRAVENOUS | Status: AC
  Administered 2019-11-03: 1000 mL via INTRAVENOUS

## 2019-11-03 MED ORDER — TRAMADOL HCL 50 MG PO TABS
50.0000 mg | ORAL_TABLET | Freq: Once | ORAL | Status: DC
  Filled 2019-11-03: qty 1

## 2019-11-03 MED ORDER — ONDANSETRON 4 MG PO TBDP
4.0000 mg | ORAL_TABLET | Freq: Four times a day (QID) | ORAL | Status: DC | PRN
  Filled 2019-11-03: qty 1

## 2019-11-03 MED ORDER — ORAL CARE MOUTH RINSE
15.0000 mL | Freq: Two times a day (BID) | OROMUCOSAL | Status: DC
  Administered 2019-11-03 – 2019-11-04 (×3): 15 mL via OROMUCOSAL

## 2019-11-03 MED ORDER — LOPERAMIDE HCL 2 MG PO CAPS: 2.0000 mg | ORAL_CAPSULE | ORAL | Status: DC | PRN

## 2019-11-03 MED ORDER — VANCOMYCIN HCL IN DEXTROSE 1-5 GM/200ML-% IV SOLN
1000.0000 mg | INTRAVENOUS | Status: DC
  Administered 2019-11-04: 1000 mg via INTRAVENOUS
  Filled 2019-11-03: qty 200

## 2019-11-03 MED ORDER — SODIUM CHLORIDE 0.9 % IV SOLN: INTRAVENOUS | Status: DC

## 2019-11-03 MED ORDER — IOHEXOL 350 MG/ML SOLN
100.0000 mL | Freq: Once | INTRAVENOUS | Status: AC | PRN
  Administered 2019-11-03: 100 mL via INTRAVENOUS

## 2019-11-03 MED ORDER — VANCOMYCIN HCL IN DEXTROSE 1-5 GM/200ML-% IV SOLN
1000.0000 mg | Freq: Once | INTRAVENOUS | Status: AC
  Administered 2019-11-03: 1000 mg via INTRAVENOUS
  Filled 2019-11-03: qty 200

## 2019-11-03 MED ORDER — LACTATED RINGERS IV BOLUS (SEPSIS)
1000.0000 mL | Freq: Once | INTRAVENOUS | Status: AC
  Administered 2019-11-03: 1000 mL via INTRAVENOUS

## 2019-11-03 MED ORDER — SODIUM CHLORIDE 0.9 % IV SOLN
2.0000 g | Freq: Once | INTRAVENOUS | Status: AC
  Administered 2019-11-03: 2 g via INTRAVENOUS
  Filled 2019-11-03: qty 2

## 2019-11-03 MED ORDER — ACETAMINOPHEN 650 MG RE SUPP: 650.0000 mg | Freq: Four times a day (QID) | RECTAL | Status: DC | PRN

## 2019-11-03 MED ORDER — ACETAMINOPHEN 325 MG PO TABS
650.0000 mg | ORAL_TABLET | Freq: Once | ORAL | Status: AC
  Administered 2019-11-03: 650 mg via ORAL
  Filled 2019-11-03: qty 2

## 2019-11-03 MED ORDER — HYDROXYZINE HCL 25 MG PO TABS
25.0000 mg | ORAL_TABLET | Freq: Four times a day (QID) | ORAL | Status: DC | PRN
  Administered 2019-11-03 – 2019-11-05 (×6): 25 mg via ORAL
  Filled 2019-11-03 (×6): qty 1

## 2019-11-03 NOTE — Progress Notes (Signed)
Patient noted to be drowsy at start of shift and blood pressure remained 90's/50's after 1L bolus and patient remains tachycardic,  Remaining in yellow muse. On call physician paged who assessed patient at bedside. Transfer orders for 4th floor. Will transfer when bed available. Patient did verbalize it was okay for staff to talk to her mother, Corrie Dandy. Mother was updated of patient transfer.

## 2019-11-03 NOTE — ED Notes (Signed)
Both antibiotics were started after every set of blood cultures were obtained. IV antibiotics scanned prior to blood being clicked off but all blood was obtained at 0940 prior to any antibiotics being started.

## 2019-11-03 NOTE — Progress Notes (Signed)
Pharmacy Antibiotic Note  Amber [name] is a 29 y.o. female admitted on 11/03/2019 with sepsis.  Pharmacy has been consulted for vanc/cefepime dosing.  Plan: Vanc 1g IV q24 Cefepime 2g IV q12  Height: 5\' 2"  (157.5 cm) Weight: 47.6 kg (105 lb) IBW/kg (Calculated) : 50.1  Temp (24hrs), Avg:100.1 F (37.8 C), Min:98.2 F (36.8 C), Max:102 F (38.9 C)  Recent Labs  Lab 11/03/19 0949 11/03/19 0950 11/03/19 1229  WBC  --  10.7*  --   CREATININE  --  1.33*  --   LATICACIDVEN 2.9*  --  1.7    Estimated Creatinine Clearance: 46.9 mL/min (A) (by C-G formula based on SCr of 1.33 mg/dL (H)).    No Known Allergies   Thank you for allowing pharmacy to be a part of this patient's care.  11/05/19 11/03/2019 2:22 PM

## 2019-11-03 NOTE — ED Notes (Signed)
Pt is a very difficult IV stick. Unable to place IV by this RN x 2. IV team consult order placed for difficult IV start. Order placed stat. Main lab phlebotomy called for blood draw assistance. MD made aware.

## 2019-11-03 NOTE — H&P (Signed)
History and Physical    ANAIRA [name] XLK:[id number] DOB: 04/06/1990 DOA: 11/03/2019  PCP: Patient, No Pcp Per  Patient coming from: Home  Chief Complaint: fatigue, weakness  HPI: Amber [name] is a 29 y.o. female with medical history significant of IVDA. Presenting with fatigue and weakness. 3 weeks ago she started having head pain. She is unable to tell me exactly where it was. Of note, hard to keep her on task during the interview d/t drowsiness. She tried to help the head pain w/ APAP and heroin. These didn't help. So the pain persisted in the background. She states that over the past several days, she has noticed that she has become increasingly weak. She has no energy to do normal daily tasks. She has notice dry cough and pain associated with cough. She has noticed decreased appetite. She became concerned and decided to come to the ED. She denies any other aggravating or alleviating factors. She denies any other treatments.    ED Course: She was found to be hyponatremic and to have an elevated Scr. It was noted that she was febrile, tachy and had a cough. CXR was notable for atypical infection. TRH was called for admission.   Review of Systems: Reports pleuritic pain. No N/V/D, ab pain.  Remainder of 10 point review of systems is otherwise negative for all not mentioned in HPI.   PMHx Past Medical History:  Diagnosis Date  . Heroin abuse ([place])   . Kidney stones   . MRSA (methicillin resistant staph aureus) culture positive   . Ovarian cyst   . UTI (lower urinary tract infection)     PSHx Past Surgical History:  Procedure Laterality Date  . FINGER SURGERY      Soc[facility]x  reports that she has quit smoking. Her smoking use included cigarettes. She has never used smokeless tobacco. She reports current drug use. Drugs: Marijuana and IV. She reports that she does not drink alcohol.  No Known Allergies  FamHx No family history on file.  Prior to Admission medications     Medication Sig Start Date End Date Taking? Authorizing Provider  acetaminophen (TYLENOL) 500 MG tablet Take 1,000 mg by mouth every 6 (six) hours as needed.   Yes [provider]  cephALEXin (KEFLEX) 500 MG capsule Take 1 capsule (500 mg total) by mouth 4 (four) times daily. Patient not taking: Reported on 11/03/2019 01/11/19   Kinnie Feil, PA-C  doxycycline (VIBRAMYCIN) 100 MG capsule Take 1 capsule (100 mg total) by mouth 2 (two) times daily. Patient not taking: Reported on 11/03/2019 08/20/18   Domenic Moras, PA-C  ibuprofen (ADVIL) 600 MG tablet Take 1 tablet (600 mg total) by mouth every 6 (six) hours as needed. Patient not taking: Reported on 11/03/2019 08/20/18   Domenic Moras, PA-C  ondansetron (ZOFRAN ODT) 4 MG disintegrating tablet Take 1 tablet (4 mg total) by mouth every 8 (eight) hours as needed for nausea or vomiting. Patient not taking: Reported on 11/03/2019 01/11/19   Kinnie Feil, [place]    Physical Exam: Vitals:   11/03/19 1030 11/03/19 1045 11/03/19 1100 11/03/19 1145  BP: (!) 104/55 (!) 106/53 (!) 107/55 (!) 100/56  Pulse: (!) 111 (!) 108 (!) 104 (!) 101  Resp: (!) 21 (!) 23 (!) 23 15  Temp:      TempSrc:      SpO2: 95% 97% 97% 98%  Weight:      Height:        General: 29 y.o.  ill appearing female resting in bed Eyes: PERRL, normal sclera ENMT: Nares patent w/o discharge, orophaynx clear, dentition normal, ears w/o discharge/lesions/ulcers Neck: Supple, trachea midline Cardiovascular: tachy, +S1, S2, no m/g/r, equal pulses throughout Respiratory: decreased at bases, no w/r/r, normal WOB GI: BS+, NDNT, no masses noted, no organomegaly noted MSK: No e/c/c Neuro: A&O x 3, no focal deficits Psyc: Appropriate interaction and affect, calm/cooperative  Labs on Admission: I have personally reviewed following labs and imaging studies  CBC: Recent Labs  Lab 11/03/19 0950  WBC 10.7*  NEUTROABS 9.3*  HGB 12.0  HCT 35.6*  MCV 79.3*  PLT 371    Basic Metabolic Panel: Recent Labs  Lab 11/03/19 0950  NA 123*  K 4.1  CL 86*  CO2 22  GLUCOSE 92  BUN 39*  CREATININE 1.33*  CALCIUM 8.0*   GFR: Estimated Creatinine Clearance: 46.9 mL/min (A) (by C-G formula based on SCr of 1.33 mg/dL (H)). Liver Function Tests: Recent Labs  Lab 11/03/19 0950  AST 84*  ALT 36  ALKPHOS 133*  BILITOT 1.0  PROT 7.5  ALBUMIN 2.2*   No results for input(s): LIPASE, AMYLASE in the last 168 hours. No results for input(s): AMMONIA in the last 168 hours. Coagulation Profile: Recent Labs  Lab 11/03/19 0950  INR 1.3*   Cardiac Enzymes: No results for input(s): CKTOTAL, CKMB, CKMBINDEX, TROPONINI in the last 168 hours. BNP (last 3 results) No results for input(s): PROBNP in the last 8760 hours. HbA1C: No results for input(s): HGBA1C in the last 72 hours. CBG: No results for input(s): GLUCAP in the last 168 hours. Lipid Profile: No results for input(s): CHOL, HDL, LDLCALC, TRIG, CHOLHDL, LDLDIRECT in the last 72 hours. Thyroid Function Tests: No results for input(s): TSH, T4TOTAL, FREET4, T3FREE, THYROIDAB in the last 72 hours. Anemia Panel: No results for input(s): VITAMINB12, FOLATE, FERRITIN, TIBC, IRON, RETICCTPCT in the last 72 hours. Urine analysis:    Component Value Date/Time   COLORURINE AMBER (A) 11/03/2019 0818   APPEARANCEUR CLOUDY (A) 11/03/2019 0818   APPEARANCEUR Turbid 01/27/2014 1811   LABSPEC 1.017 11/03/2019 0818   LABSPEC 1.011 01/27/2014 1811   PHURINE 5.0 11/03/2019 0818   GLUCOSEU NEGATIVE 11/03/2019 0818   GLUCOSEU Negative 01/27/2014 1811   HGBUR NEGATIVE 11/03/2019 0818   BILIRUBINUR NEGATIVE 11/03/2019 0818   BILIRUBINUR Negative 01/27/2014 1811   KETONESUR NEGATIVE 11/03/2019 0818   PROTEINUR NEGATIVE 11/03/2019 0818   UROBILINOGEN 0.2 10/14/2012 2010   NITRITE NEGATIVE 11/03/2019 0818   LEUKOCYTESUR MODERATE (A) 11/03/2019 0818   LEUKOCYTESUR Negative 01/27/2014 1811    Radiological Exams on  Admission: CT Angio Chest PE W and/or Wo Contrast  Result Date: 11/03/2019 CLINICAL DATA:  Hypotensive, heroin user EXAM: CT ANGIOGRAPHY CHEST WITH CONTRAST TECHNIQUE: Multidetector CT imaging of the chest was performed using the standard protocol during bolus administration of intravenous contrast. Multiplanar CT image reconstructions and MIPs were obtained to evaluate the vascular anatomy. CONTRAST:  193m OMNIPAQUE IOHEXOL 350 MG/ML SOLN COMPARISON:  None. FINDINGS: Cardiovascular: Satisfactory opacification of the pulmonary arteries to the segmental level. No evidence of pulmonary embolism. Normal heart size. No pericardial effusion. Thoracic aorta is normal in caliber. Mediastinum/Nodes: Mildly enlarged mediastinal and hilar lymph nodes are likely reactive. Thyroid and esophagus are unremarkable. Lungs/Pleura: Numerous nodules are present bilaterally with some demonstrating internal cavitation. Mild interlobular septal thickening. Mild bibasilar atelectasis. No pleural effusion or pneumothorax. Upper Abdomen: No acute abnormality. Musculoskeletal: Osseous structures are unremarkable. Review of the MIP images confirms the  above findings. IMPRESSION: No acute pulmonary embolism. Pulmonary findings most consistent with septic emboli. Minor interstitial pulmonary edema. These results were called by telephone at the time of interpretation on 11/03/2019 at 11:56 am to provider ADAM CURATOLO , who verbally acknowledged these results. Electronically Signed   By: Macy Mis M.D.   On: 11/03/2019 12:01   DG Chest Port 1 View  Result Date: 11/03/2019 CLINICAL DATA:  Sepsis, IV drug use EXAM: PORTABLE CHEST 1 VIEW COMPARISON:  09/07/2014 FINDINGS: Since the prior examination, there have developed multiple peripherally distributed pulmonary nodules within the lungs bilaterally. Given the patient's history of drug use, septic embolization should be considered. Atypical infection, such as fungal pneumonia, or  metastatic disease are considered less likely, but could present similarly. No confluent pulmonary infiltrates. Cardiac size within normal limits. Pulmonary vascularity is normal. No acute bone abnormality. IMPRESSION: Interval development of multiple pulmonary nodules. This could be better assessed with CT examination, if indicated. Electronically Signed   By: Fidela Salisbury MD   On: 11/03/2019 08:58    EKG: Independently reviewed. Sinus tach  Assessment/Plan Sepsis secondary to PNA     - admit to inpatient, telemetry     - CXR w/ atypical infection; COVID/flu negative     - CT chest w/ possible septic emboli     - continue cefepime, vanc     - check Bld Cx, UCx     - check Echo  IVDA Possible opioid withdrawal     - counseled against further use     - unable to use clonidine right now, but will order the rest of the detox program  AKI     - fluids; follow morning labs  Elevated alk phos     - check ggt  Hyponatremia     - fluids, follow q6 renal function panel     - no seizure activity; this is likely an acute change but will target her for no more than 8 pt increase over next 24 hours.  DVT prophylaxis: heparin  Code Status: FULL  Family Communication: None at bedside.  Consults called: None  Admission status: Inpatient.  Status is: Inpatient  Remains inpatient appropriate because:Inpatient level of care appropriate due to severity of illness   Dispo: The patient is from: Home              Anticipated d/c is to: Home              Anticipated d/c date is: > 3 days              Patient currently is not medically stable to d/c.  Jonnie Finner DO [facility]  If 7PM-7AM, please contact night-coverage [email or web address]  11/03/2019, 12:17 PM

## 2019-11-03 NOTE — ED Notes (Signed)
2nd lactic acid will be due at 1150 based off delay in obtaining the first one.

## 2019-11-03 NOTE — ED Notes (Signed)
ED TO INPATIENT HANDOFF REPORT  Name/Age/Gender Amber [name] 29 y.o. female  Code Status Code Status History    Date Active Date Inactive Code Status Order ID Comments User Context   11/21/2017 0329 11/24/2017 2316 Full Code [id number]  Briscoe Deutscher, MD ED   09/29/2015 1359 09/30/2015 1943 Full Code [id number]  Samuel Jester, DO ED   Advance Care Planning Activity    Questions for Most Recent Historical Code Status (Order [id number])       Home/SNF/Other Home  Chief Complaint Sepsis [facility]) [A41.9]  Level of Care/Admitting Diagnosis ED Disposition    ED Disposition Condition Comment   Admit  Hospital Area: [facility] [[id number]]  Level of Care: Telemetry [5]  Admit to tele based on following criteria: Other see comments  Comments: sepsis  May admit patient to [facility] or [facility] if equivalent level of care is available:: No  Covid Evaluation: Confirmed COVID Negative  Diagnosis: Sepsis [facility]) [[id number]]  Admitting Physician: Teddy Spike [[id number]]  Attending Physician: Teddy Spike [[id number]]  Estimated length of stay: past midnight tomorrow  Certification:: I certify this patient will need inpatient services for at least 2 midnights       Medical History Past Medical History:  Diagnosis Date  . Heroin abuse (HCC)   . Kidney stones   . MRSA (methicillin resistant staph aureus) culture positive   . Ovarian cyst   . UTI (lower urinary tract infection)     Allergies No Known Allergies  IV Location/Drains/Wounds Patient Lines/Drains/Airways Status    Active Line/Drains/Airways    Name Placement date Placement time Site Days   Peripheral IV 11/03/19 Left Antecubital 11/03/19  0943  Antecubital  less than 1   Peripheral IV 11/03/19 Right Hand 11/03/19  0958  Hand  less than 1          Labs/Imaging Results for orders placed or performed during the hospital encounter of 11/03/19 (from the past 48 hour(s))  Urinalysis, Routine w  reflex microscopic Urine, Clean Catch     Status: Abnormal   Collection Time: 11/03/19  8:18 AM  Result Value Ref Range   Color, Urine AMBER (A) YELLOW    Comment: BIOCHEMICALS MAY BE AFFECTED BY COLOR   APPearance CLOUDY (A) CLEAR   Specific Gravity, Urine 1.017 1.005 - 1.030   pH 5.0 5.0 - 8.0   Glucose, UA NEGATIVE NEGATIVE mg/dL   Hgb urine dipstick NEGATIVE NEGATIVE   Bilirubin Urine NEGATIVE NEGATIVE   Ketones, ur NEGATIVE NEGATIVE mg/dL   Protein, ur NEGATIVE NEGATIVE mg/dL   Nitrite NEGATIVE NEGATIVE   Leukocytes,Ua MODERATE (A) NEGATIVE   RBC / HPF 0-5 0 - 5 RBC/hpf   WBC, UA >50 (H) 0 - 5 WBC/hpf   Bacteria, UA FEW (A) NONE SEEN   Squamous Epithelial / LPF 6-10 0 - 5   Mucus PRESENT    Hyaline Casts, UA PRESENT     Comment: Performed at [facility], [address] [place], [place] [address]  Respiratory Panel by RT PCR (Flu A&B, Covid) - Nasopharyngeal Swab     Status: None   Collection Time: 11/03/19  9:13 AM   Specimen: Nasopharyngeal Swab  Result Value Ref Range   SARS Coronavirus 2 by RT PCR NEGATIVE NEGATIVE    Comment: (NOTE) SARS-CoV-2 target nucleic acids are NOT DETECTED.  The SARS-CoV-2 RNA is generally detectable in upper respiratoy specimens during the acute phase of infection. The lowest concentration of SARS-CoV-2  viral copies this assay can detect is 131 copies/mL. A negative result does not preclude SARS-Cov-2 infection and should not be used as the sole basis for treatment or other patient management decisions. A negative result may occur with  improper specimen collection/handling, submission of specimen other than nasopharyngeal swab, presence of viral mutation(s) within the areas targeted by this assay, and inadequate number of viral copies (<131 copies/mL). A negative result must be combined with clinical observations, patient history, and epidemiological information. The expected result is Negative.  Fact Sheet for  Patients:  https://www.moore.com/  Fact Sheet for Healthcare Providers:  https://www.young.biz/  This test is no t yet approved or cleared by the [place] FDA and  has been authorized for detection and/or diagnosis of SARS-CoV-2 by FDA under an Emergency Use Authorization (EUA). This EUA will remain  in effect (meaning this test can be used) for the duration of the COVID-19 declaration under Section 564(b)(1) of the Act, 21 U.S.C. section 360bbb-3(b)(1), unless the authorization is terminated or revoked sooner.     Influenza A by PCR NEGATIVE NEGATIVE   Influenza B by PCR NEGATIVE NEGATIVE    Comment: (NOTE) The Xpert Xpress SARS-CoV-2/FLU/RSV assay is intended as an aid in  the diagnosis of influenza from Nasopharyngeal swab specimens and  should not be used as a sole basis for treatment. Nasal washings and  aspirates are unacceptable for Xpert Xpress SARS-CoV-2/FLU/RSV  testing.  Fact Sheet for Patients: https://www.moore.com/  Fact Sheet for Healthcare Providers: https://www.young.biz/  This test is not yet approved or cleared by the [place] FDA and  has been authorized for detection and/or diagnosis of SARS-CoV-2 by  FDA under an Emergency Use Authorization (EUA). This EUA will remain  in effect (meaning this test can be used) for the duration of the  Covid-19 declaration under Section 564(b)(1) of the Act, 21  U.S.C. section 360bbb-3(b)(1), unless the authorization is  terminated or revoked. Performed at [facility], [address], [place], [place] [address]   Lactic acid, plasma     Status: Abnormal   Collection Time: 11/03/19  9:49 AM  Result Value Ref Range   Lactic Acid, Venous 2.9 (HH) 0.5 - 1.9 mmol/L    Comment: CRITICAL RESULT CALLED TO, READ BACK BY AND VERIFIED WITHPrincella Ion RN AT 1056 11/03/19 MULLINS,T Performed at [facility],  [address] [place], [place] [address]   Comprehensive metabolic panel     Status: Abnormal   Collection Time: 11/03/19  9:50 AM  Result Value Ref Range   Sodium 123 (L) 135 - 145 mmol/L   Potassium 4.1 3.5 - 5.1 mmol/L   Chloride 86 (L) 98 - 111 mmol/L   CO2 22 22 - 32 mmol/L   Glucose, Bld 92 70 - 99 mg/dL    Comment: Glucose reference range applies only to samples taken after fasting for at least 8 hours.   BUN 39 (H) 6 - 20 mg/dL   Creatinine, Ser 2.70 (H) 0.44 - 1.00 mg/dL   Calcium 8.0 (L) 8.9 - 10.3 mg/dL   Total Protein 7.5 6.5 - 8.1 g/dL   Albumin 2.2 (L) 3.5 - 5.0 g/dL   AST 84 (H) 15 - 41 U/L   ALT 36 0 - 44 U/L   Alkaline Phosphatase 133 (H) 38 - 126 U/L   Total Bilirubin 1.0 0.3 - 1.2 mg/dL   GFR, Estimated 56 (L) >60 mL/min    Comment: (NOTE) Calculated using the CKD-EPI Creatinine Equation (  2021)    Anion gap 15 5 - 15    Comment: Performed at [facility], [address] [place], [place] [address]  CBC WITH DIFFERENTIAL     Status: Abnormal   Collection Time: 11/03/19  9:50 AM  Result Value Ref Range   WBC 10.7 (H) 4.0 - 10.5 K/uL    Comment: WHITE COUNT CONFIRMED ON SMEAR   RBC 4.49 3.87 - 5.11 MIL/uL   Hemoglobin 12.0 12.0 - 15.0 g/dL   HCT 60.435.6 (L) 36 - 46 %   MCV 79.3 (L) 80.0 - 100.0 fL   MCH 26.7 26.0 - 34.0 pg   MCHC 33.7 30.0 - 36.0 g/dL   RDW 54.014.4 98.111.5 - 19.115.5 %   Platelets 173 150 - 400 K/uL   nRBC 0.0 0.0 - 0.2 %   Neutrophils Relative % 87 %   Neutro Abs 9.3 (H) 1.7 - 7.7 K/uL   Lymphocytes Relative 5 %   Lymphs Abs 0.6 (L) 0.7 - 4.0 K/uL   Monocytes Relative 5 %   Monocytes Absolute 0.5 0.1 - 1.0 K/uL   Eosinophils Relative 0 %   Eosinophils Absolute 0.0 0.0 - 0.5 K/uL   Basophils Relative 1 %   Basophils Absolute 0.1 0.0 - 0.1 K/uL   WBC Morphology DOHLE BODIES     Comment: TOXIC GRANULATION   Immature Granulocytes 2 %   Abs Immature Granulocytes 0.18 (H) 0.00 - 0.07 K/uL    Comment: Performed at [facility], [address] [place], [place] [address]  Protime-INR     Status: Abnormal   Collection Time: 11/03/19  9:50 AM  Result Value Ref Range   Prothrombin Time 15.8 (H) 11.4 - 15.2 seconds   INR 1.3 (H) 0.8 - 1.2    Comment: (NOTE) INR goal varies based on device and disease states. Performed at [facility], [address] [place], [place] [address]   APTT     Status: None   Collection Time: 11/03/19  9:50 AM  Result Value Ref Range   aPTT 32 24 - 36 seconds    Comment: Performed at [facility], [address] [place], [place] [address]  Blood Culture (routine x 2)     Status: None (Preliminary result)   Collection Time: 11/03/19  9:50 AM   Specimen: BLOOD RIGHT HAND  Result Value Ref Range   Specimen Description      BLOOD RIGHT HAND Performed at [facility], [address] [place], [place] [address]    Special Requests      BOTTLES DRAWN AEROBIC ONLY Blood Culture adequate volume Performed at [facility], [address] [place], [place] [address]    Culture      NO GROWTH < 12 HOURS Performed at [facility], [address] [place], [place] [address]    Report Status PENDING   Blood Culture (routine x 2)     Status: None (Preliminary result)   Collection Time: 11/03/19  9:50 AM   Specimen: BLOOD  Result Value Ref Range   Specimen Description      BLOOD LEFT ARM Performed at [facility], [address] [place], [place] [address]    Special Requests      BOTTLES DRAWN AEROBIC AND ANAEROBIC Blood Culture adequate volume Performed at [facility], [address] [place], [place] [address]    Culture  NO GROWTH < 12 HOURS Performed at [facility], [address], [place], [place] [address]    Report Status PENDING   Culture, blood (Routine X 2) w Reflex to ID Panel     Status: None (Preliminary result)    Collection Time: 11/03/19  9:52 AM   Specimen: BLOOD  Result Value Ref Range   Specimen Description      BLOOD RIGHT ARM Performed at [facility], [address], [place], [place] [address]    Special Requests      BOTTLES DRAWN AEROBIC ONLY Blood Culture adequate volume Performed at [facility], [address] [place], [place] [address]    Culture      NO GROWTH < 12 HOURS Performed at [facility], [address], [place], [place] [address]    Report Status PENDING   I-Stat beta hCG blood, ED     Status: None   Collection Time: 11/03/19 10:01 AM  Result Value Ref Range   I-stat hCG, quantitative <5.0 <5 mIU/mL   Comment 3            Comment:   GEST. AGE      CONC.  (mIU/mL)   <=1 WEEK        5 - 50     2 WEEKS       50 - 500     3 WEEKS       100 - 10,000     4 WEEKS     1,000 - 30,000        FEMALE AND NON-PREGNANT FEMALE:     LESS THAN 5 mIU/mL   Lactic acid, plasma     Status: None   Collection Time: 11/03/19 12:29 PM  Result Value Ref Range   Lactic Acid, Venous 1.7 0.5 - 1.9 mmol/L    Comment: Performed at [facility], [address], [place], [place] [address]   CT Angio Chest PE W and/or Wo Contrast  Result Date: 11/03/2019 CLINICAL DATA:  Hypotensive, heroin user EXAM: CT ANGIOGRAPHY CHEST WITH CONTRAST TECHNIQUE: Multidetector CT imaging of the chest was performed using the standard protocol during bolus administration of intravenous contrast. Multiplanar CT image reconstructions and MIPs were obtained to evaluate the vascular anatomy. CONTRAST:  OMNIPAQUE IOHEXOL 350 MG/ML SOLN COMPARISON:  None. FINDINGS: Cardiovascular: Satisfactory opacification of the pulmonary arteries to the segmental level. No evidence of pulmonary embolism. Normal heart size. No pericardial effusion. Thoracic aorta is normal in caliber. Mediastinum/Nodes: Mildly enlarged mediastinal and hilar lymph nodes are likely  reactive. Thyroid and esophagus are unremarkable. Lungs/Pleura: Numerous nodules are present bilaterally with some demonstrating internal cavitation. Mild interlobular septal thickening. Mild bibasilar atelectasis. No pleural effusion or pneumothorax. Upper Abdomen: No acute abnormality. Musculoskeletal: Osseous structures are unremarkable. Review of the MIP images confirms the above findings. IMPRESSION: No acute pulmonary embolism. Pulmonary findings most consistent with septic emboli. Minor interstitial pulmonary edema. These results were called by telephone at the time of interpretation on 11/03/2019 at 11:56 am to provider ADAM CURATOLO , who verbally acknowledged these results. Electronically Signed   By: Guadlupe Spanish M.D.   On: 11/03/2019 12:01   DG Chest Port 1 View  Result Date: 11/03/2019 CLINICAL DATA:  Sepsis, IV drug use EXAM: PORTABLE CHEST 1 VIEW COMPARISON:  09/07/2014 FINDINGS: Since the prior examination, there have developed multiple peripherally distributed pulmonary nodules within the lungs bilaterally. Given the patient's history of drug  use, septic embolization should be considered. Atypical infection, such as fungal pneumonia, or metastatic disease are considered less likely, but could present similarly. No confluent pulmonary infiltrates. Cardiac size within normal limits. Pulmonary vascularity is normal. No acute bone abnormality. IMPRESSION: Interval development of multiple pulmonary nodules. This could be better assessed with CT examination, if indicated. Electronically Signed   By: Helyn Numbers MD   On: 11/03/2019 08:58    Pending Labs Unresulted Labs (From admission, onward)          Start     Ordered   11/03/19 0950  Lactic acid, plasma  (Septic presentation on arrival (screening labs, nursing and treatment orders for obvious sepsis))  Now then every 2 hours,   STAT      11/03/19 1049   11/03/19 0818  Urine culture  (Septic presentation on arrival (screening labs,  nursing and treatment orders for obvious sepsis))  ONCE - STAT,   STAT        11/03/19 0818   Signed and Held  HIV Antibody (routine testing w rflx)  (HIV Antibody (Routine testing w reflex) panel)  Once,   R        Signed and Held   Signed and Held  [employer] morning,   R        Signed and Held   Signed and Held  Cortisol-am, blood  Tomorrow morning,   R        Signed and Held   Signed and Held  Procalcitonin  Tomorrow morning,   R        Signed and Held   Signed and Held  CBC  (enoxaparin (LOVENOX)    CrCl >/= 30 ml/min)  Once,   R       Comments: Baseline for enoxaparin therapy IF NOT ALREADY DRAWN.  Notify MD if PLT < 100 K.    Signed and Held   Signed and Held  Creatinine, serum  (enoxaparin (LOVENOX)    CrCl >/= 30 ml/min)  Once,   R       Comments: Baseline for enoxaparin therapy IF NOT ALREADY DRAWN.    Signed and Held   Signed and Held  Creatinine, serum  (enoxaparin (LOVENOX)    CrCl >/= 30 ml/min)  Weekly,   R     Comments: while on enoxaparin therapy    Signed and Held   Signed and Held  CBC  Tomorrow morning,   R        Signed and Held   Signed and Held  Renal function panel  Every 6 hours,   R      Signed and Held          Vitals/Pain Today's Vitals   11/03/19 1230 11/03/19 1319 11/03/19 1330 11/03/19 1415  BP: 103/61 97/69 112/64 117/88  Pulse: 99 (!) 114 (!) 115 (!) 138  Resp: 19 19 (!) 35 (!) 26  Temp: 98.2 F (36.8 C)     TempSrc: Oral     SpO2: 96% 95% 93% 95%  Weight:      Height:      PainSc:        Isolation Precautions No active isolations  Medications Medications  hydrOXYzine (ATARAX/VISTARIL) tablet 25 mg (25 mg Oral Given 11/03/19 1407)  loperamide (IMODIUM) capsule 2-4 mg (has no administration in time range)  methocarbamol (ROBAXIN) tablet 500 mg (500 mg Oral Given 11/03/19 1407)  ondansetron (ZOFRAN-ODT) disintegrating tablet 4 mg (has no administration in time  range)  0.9 %  sodium chloride infusion (has no  administration in time range)  ceFEPIme (MAXIPIME) 2 g in sodium chloride 0.9 % 100 mL IVPB (has no administration in time range)  vancomycin (VANCOCIN) IVPB 1000 mg/200 mL premix (has no administration in time range)  lactated ringers bolus 1,000 mL (0 mLs Intravenous Stopped 11/03/19 1421)    And  lactated ringers bolus 1,000 mL (0 mLs Intravenous Stopped 11/03/19 1120)  ceFEPIme (MAXIPIME) 2 g in sodium chloride 0.9 % 100 mL IVPB (0 g Intravenous Stopped 11/03/19 1120)  vancomycin (VANCOCIN) IVPB 1000 mg/200 mL premix (0 mg Intravenous Stopped 11/03/19 1120)  acetaminophen (TYLENOL) tablet 650 mg (650 mg Oral Given 11/03/19 0913)  iohexol (OMNIPAQUE) 350 MG/ML injection 100 mL (100 mLs Intravenous Contrast Given 11/03/19 1130)    Mobility walks

## 2019-11-03 NOTE — ED Notes (Signed)
MD at bedside. 

## 2019-11-03 NOTE — ED Provider Notes (Addendum)
[facility]-EMERGENCY DEPT Provider Note   CSN: [id number] Arrival date & time: 11/03/19  0755     History Chief Complaint  Patient presents with  . Fever  . Weakness    Amber [name] is a 29 y.o. female.  The history is provided by the patient.  Weakness Severity:  Moderate Onset quality:  Gradual Timing:  Constant Progression:  Worsening Chronicity:  New Context: drug use (IV heroin use, decreased PO intake. Cough, pain with urination, low back pain during that time. Feels dehydrated. Last used heroin yesterday)   Relieved by:  Nothing Worsened by:  Nothing Associated symptoms: arthralgias, cough and dysuria   Associated symptoms: no abdominal pain, no chest pain, no fever, no seizures, no shortness of breath and no vomiting        Past Medical History:  Diagnosis Date  . Heroin abuse (HCC)   . Kidney stones   . MRSA (methicillin resistant staph aureus) culture positive   . Ovarian cyst   . UTI (lower urinary tract infection)     Patient Active Problem List   Diagnosis Date Noted  . Abscess of face 08/20/2018  . Facial cellulitis 11/21/2017  . Depression   . Polysubstance abuse (HCC)   . Suicidal ideation     Past Surgical History:  Procedure Laterality Date  . FINGER SURGERY       OB History   No obstetric history on file.     No family history on file.  Social History   Tobacco Use  . Smoking status: Former Smoker    Types: Cigarettes  . Smokeless tobacco: Never Used  Vaping Use  . Vaping Use: Never used  Substance Use Topics  . Alcohol use: No  . Drug use: Yes    Types: Marijuana, IV    Comment: opiods- pain medication/heroin    Home Medications Prior to Admission medications   Medication Sig Start Date End Date Taking? Authorizing Provider  acetaminophen (TYLENOL) 500 MG tablet Take 1,000 mg by mouth every 6 (six) hours as needed.   Yes [provider]  cephALEXin (KEFLEX) 500 MG capsule Take 1  capsule (500 mg total) by mouth 4 (four) times daily. Patient not taking: Reported on 11/03/2019 01/11/19   Liberty Handy, PA-C  doxycycline (VIBRAMYCIN) 100 MG capsule Take 1 capsule (100 mg total) by mouth 2 (two) times daily. Patient not taking: Reported on 11/03/2019 08/20/18   Fayrene Helper, PA-C  ibuprofen (ADVIL) 600 MG tablet Take 1 tablet (600 mg total) by mouth every 6 (six) hours as needed. Patient not taking: Reported on 11/03/2019 08/20/18   Fayrene Helper, PA-C  ondansetron (ZOFRAN ODT) 4 MG disintegrating tablet Take 1 tablet (4 mg total) by mouth every 8 (eight) hours as needed for nausea or vomiting. Patient not taking: Reported on 11/03/2019 01/11/19   Liberty Handy, PA-C    Allergies    Patient has no known allergies.  Review of Systems   Review of Systems  Constitutional: Negative for chills and fever.  HENT: Negative for ear pain and sore throat.   Eyes: Negative for pain and visual disturbance.  Respiratory: Positive for cough. Negative for shortness of breath.   Cardiovascular: Negative for chest pain and palpitations.  Gastrointestinal: Negative for abdominal pain and vomiting.  Genitourinary: Positive for dysuria. Negative for hematuria.  Musculoskeletal: Positive for arthralgias and back pain.  Skin: Negative for color change and rash.  Neurological: Positive for weakness. Negative for seizures and syncope.  All other systems reviewed and are negative.   Physical Exam Updated Vital Signs  ED Triage Vitals  Enc Vitals Group     BP 11/03/19 0813 (!) 94/56     Pulse Rate 11/03/19 0813 (!) 134     Resp 11/03/19 0813 (!) 24     Temp 11/03/19 0813 (!) 102 F (38.9 C)     Temp Source 11/03/19 0813 Oral     SpO2 11/03/19 0813 100 %     Weight 11/03/19 0813 105 lb (47.6 kg)     Height 11/03/19 0813 5\' 2"  (1.575 m)     Head Circumference --      Peak Flow --      Pain Score 11/03/19 0810 10     Pain Loc --      Pain Edu? --      Excl. in GC? --      Physical Exam Vitals and nursing note reviewed.  Constitutional:      General: She is in acute distress.     Appearance: She is well-developed. She is ill-appearing.  HENT:     Head: Normocephalic and atraumatic.     Nose: Nose normal.     Mouth/Throat:     Mouth: Mucous membranes are dry.  Eyes:     Conjunctiva/sclera: Conjunctivae normal.     Pupils: Pupils are equal, round, and reactive to light.  Cardiovascular:     Rate and Rhythm: Normal rate and regular rhythm.     Pulses: Normal pulses.     Heart sounds: Murmur (?) heard.   Pulmonary:     Effort: Pulmonary effort is normal. No respiratory distress.     Breath sounds: Normal breath sounds.  Abdominal:     General: There is no distension.     Palpations: Abdomen is soft.     Tenderness: There is no abdominal tenderness.  Musculoskeletal:        General: Tenderness (TTP paraspinal lumbar muscles, no focal point tenderness in midline spine) present.     Cervical back: Normal range of motion and neck supple.  Skin:    General: Skin is warm and dry.     Capillary Refill: Capillary refill takes less than 2 seconds.  Neurological:     General: No focal deficit present.     Mental Status: She is alert and oriented to person, place, and time.     Cranial Nerves: No cranial nerve deficit.     Sensory: No sensory deficit.     Motor: No weakness.     Coordination: Coordination normal.     Comments: 5+ out of 5 strength all, normal sensation, no drift  Psychiatric:        Mood and Affect: Mood normal.     ED Results / Procedures / Treatments   Labs (all labs ordered are listed, but only abnormal results are displayed) Labs Reviewed  COMPREHENSIVE METABOLIC PANEL - Abnormal; Notable for the following components:      Result Value   Sodium 123 (*)    Chloride 86 (*)    BUN 39 (*)    Creatinine, Ser 1.33 (*)    Calcium 8.0 (*)    Albumin 2.2 (*)    AST 84 (*)    Alkaline Phosphatase 133 (*)    GFR, Estimated 56  (*)    All other components within normal limits  CBC WITH DIFFERENTIAL/PLATELET - Abnormal; Notable for the following components:   WBC 10.7 (*)  HCT 35.6 (*)    MCV 79.3 (*)    Neutro Abs 9.3 (*)    Lymphs Abs 0.6 (*)    Abs Immature Granulocytes 0.18 (*)    All other components within normal limits  PROTIME-INR - Abnormal; Notable for the following components:   Prothrombin Time 15.8 (*)    INR 1.3 (*)    All other components within normal limits  URINALYSIS, ROUTINE W REFLEX MICROSCOPIC - Abnormal; Notable for the following components:   Color, Urine AMBER (*)    APPearance CLOUDY (*)    Leukocytes,Ua MODERATE (*)    WBC, UA >50 (*)    Bacteria, UA FEW (*)    All other components within normal limits  RESPIRATORY PANEL BY RT PCR (FLU A&B, COVID)  CULTURE, BLOOD (ROUTINE X 2)  CULTURE, BLOOD (ROUTINE X 2)  URINE CULTURE  CULTURE, BLOOD (ROUTINE X 2) W REFLEX TO ID PANEL  APTT  LACTIC ACID, PLASMA  LACTIC ACID, PLASMA  LACTIC ACID, PLASMA  I-STAT BETA HCG BLOOD, ED ([facility], [facility], [facility] ONLY)    EKG EKG Interpretation  Date/Time:  Thursday November 03 2019 08:12:14 EDT Ventricular Rate:  133 PR Interval:    QRS Duration: 85 QT Interval:  316 QTC Calculation: 470 R Axis:   94 Text Interpretation: Sinus tachycardia Consider right atrial enlargement Borderline right axis deviation Borderline Q waves in lateral leads Borderline ST depression, diffuse leads Confirmed by Virgina Norfolk [phone]) on 11/03/2019 8:38:16 AM   Radiology DG Chest Port 1 View  Result Date: 11/03/2019 CLINICAL DATA:  Sepsis, IV drug use EXAM: PORTABLE CHEST 1 VIEW COMPARISON:  09/07/2014 FINDINGS: Since the prior examination, there have developed multiple peripherally distributed pulmonary nodules within the lungs bilaterally. Given the patient's history of drug use, septic embolization should be considered. Atypical infection, such as fungal pneumonia, or metastatic disease are considered less likely, but  could present similarly. No confluent pulmonary infiltrates. Cardiac size within normal limits. Pulmonary vascularity is normal. No acute bone abnormality. IMPRESSION: Interval development of multiple pulmonary nodules. This could be better assessed with CT examination, if indicated. Electronically Signed   By: Helyn Numbers MD   On: 11/03/2019 08:58    Procedures .Critical Care Performed by: Virgina Norfolk, DO Authorized by: Virgina Norfolk, DO   Critical care provider statement:    Critical care time (minutes):  45   Critical care was necessary to treat or prevent imminent or life-threatening deterioration of the following conditions:  Sepsis   Critical care was time spent personally by me on the following activities:  Blood draw for specimens, development of treatment plan with patient or surrogate, discussions with primary provider, evaluation of patient's response to treatment, examination of patient, obtaining history from patient or surrogate, ordering and performing treatments and interventions, ordering and review of laboratory studies, ordering and review of radiographic studies, pulse oximetry, re-evaluation of patient's condition and review of old charts   I assumed direction of critical care for this patient from another provider in my specialty: no     (including critical care time)  Medications Ordered in ED Medications  vancomycin (VANCOCIN) IVPB 1000 mg/200 mL premix (1,000 mg Intravenous New Bag/Given 11/03/19 1002)  lactated ringers bolus 1,000 mL (1,000 mLs Intravenous Not Given 11/03/19 1001)    And  lactated ringers bolus 1,000 mL (1,000 mLs Intravenous New Bag/Given 11/03/19 0944)  ceFEPIme (MAXIPIME) 2 g in sodium chloride 0.9 % 100 mL IVPB (2 g Intravenous New Bag/Given 11/03/19 0947)  acetaminophen (TYLENOL) tablet 650 mg (650 mg Oral Given 11/03/19 0913)    ED Course  I have reviewed the triage vital signs and the nursing notes.  Pertinent labs & imaging  results that were available during my care of the patient were reviewed by me and considered in my medical decision making (see chart for details).    MDM Rules/Calculators/A&P                          Amber [name] is a 29 year old female with history of IV drug abuse who presents to the ED with generalized weakness.  Found to be tachycardic, febrile.  Blood pressure 94/56.  States that she has not had much to drink or eat the last several days.  Has had cough.  Has had pain with urination.  She feels as if she has a spasm in her low back.  No abscesses or cellulitis on exam.  No midline spinal tenderness on exam.  Neurologically intact.  Questionable murmur.  No abdominal tenderness.  Overall unremarkable breath sounds.  Given fever and tachycardia and borderline blood pressure sepsis work-up has been initiated.  She is high risk for bacteremia given IV drug use.  Last used yesterday.  Broad-spectrum IV antibiotics have been ordered.  Sepsis work-up initiated.  Patient with leukocytosis of 10.7. Lactic acid 2.9. Urinalysis possibly with infection.  Chest x-ray concerning for septic emboli given her history of IV drug use.  Possible atypical infection.  Covid testing is negative.  Broad-spectrum antibiotics have been given.  IV fluids have helped tachycardia.  We will get a CT scan of the chest to further evaluate infectious process.  She will need to be admitted for further sepsis care.  High risk for bacteremia.  Mild AKI.  This chart was dictated using voice recognition software.  Despite best efforts to proofread,  errors can occur which can change the documentation meaning.   Amber [name] was evaluated in Emergency Department on 11/03/2019 for the symptoms described in the history of present illness. She was evaluated in the context of the global COVID-19 pandemic, which necessitated consideration that the patient might be at risk for infection with the SARS-CoV-2 virus that causes COVID-19.  Institutional protocols and algorithms that pertain to the evaluation of patients at risk for COVID-19 are in a state of rapid change based on information released by regulatory bodies including the CDC and federal and state organizations. These policies and algorithms were followed during the patient's care in the ED.   Final Clinical Impression(s) / ED Diagnoses Final diagnoses:  Sepsis, due to unspecified organism, unspecified whether acute organ dysfunction present [facility](HCC)  Community acquired pneumonia, unspecified laterality    Rx / DC Orders ED Discharge Orders    None       Virgina NorfolkCuratolo, Sareena Odeh, DO 11/03/19 1054    Virgina Norfolkuratolo, Sophronia Varney, DO 11/03/19 1059

## 2019-11-03 NOTE — ED Notes (Signed)
Pt's mom would like to speak with the nurse who's taking care of patient. Mom's name is: Jensyn Shave and telephone #: [phone]

## 2019-11-03 NOTE — Progress Notes (Signed)
Notified bedside nurse of need to draw lactic acid and blood cultures.,  then give antibiotics °

## 2019-11-03 NOTE — Progress Notes (Signed)
Notified bedside nurse of need to draw repeat lactic acid. 

## 2019-11-03 NOTE — ED Notes (Signed)
Dr. Lockie Mola made of pt's lactic acid of 2.9.

## 2019-11-03 NOTE — ED Notes (Addendum)
Patient yelling out for pain medicine. MD paged.

## 2019-11-03 NOTE — Progress Notes (Signed)
Per bedside RN blood cultures drawn before antibiotics given 

## 2019-11-03 NOTE — Progress Notes (Signed)
A consult was received from an ED physician for Cefepime and Vancomycin per pharmacy dosing.  The patient's profile has been reviewed for ht/wt/allergies/indication/available labs.   A one time order has been placed for Vancomycin 1g IV, Cefepime 2g IV.  Further antibiotics/pharmacy consults should be ordered by admitting physician if indicated.                       Thank you, Lynann Beaver PharmD, BCPS Clinical Pharmacist [facility] main pharmacy [phone] 11/03/2019 8:35 AM

## 2019-11-03 NOTE — ED Triage Notes (Addendum)
Per EMS-states she is a heroin user-hasn't had any PO intake in 2 weeks-last heroin use yesterday-hypotensive, elevated HR-possible dehydration

## 2019-11-04 ENCOUNTER — Inpatient Hospital Stay (HOSPITAL_COMMUNITY): Payer: Self-pay

## 2019-11-04 ENCOUNTER — Other Ambulatory Visit: Payer: Self-pay

## 2019-11-04 DIAGNOSIS — R7881 Bacteremia: Secondary | ICD-10-CM

## 2019-11-04 DIAGNOSIS — M79672 Pain in left foot: Secondary | ICD-10-CM

## 2019-11-04 DIAGNOSIS — A4102 Sepsis due to Methicillin resistant Staphylococcus aureus: Principal | ICD-10-CM

## 2019-11-04 DIAGNOSIS — M545 Low back pain, unspecified: Secondary | ICD-10-CM

## 2019-11-04 DIAGNOSIS — R918 Other nonspecific abnormal finding of lung field: Secondary | ICD-10-CM

## 2019-11-04 DIAGNOSIS — B9562 Methicillin resistant Staphylococcus aureus infection as the cause of diseases classified elsewhere: Secondary | ICD-10-CM

## 2019-11-04 DIAGNOSIS — I2601 Septic pulmonary embolism with acute cor pulmonale: Secondary | ICD-10-CM

## 2019-11-04 DIAGNOSIS — M79671 Pain in right foot: Secondary | ICD-10-CM

## 2019-11-04 DIAGNOSIS — I330 Acute and subacute infective endocarditis: Secondary | ICD-10-CM

## 2019-11-04 LAB — CBC
HCT: 27.9 % — ABNORMAL LOW (ref 36.0–46.0)
Hemoglobin: 9.3 g/dL — ABNORMAL LOW (ref 12.0–15.0)
MCH: 26.7 pg (ref 26.0–34.0)
MCHC: 33.3 g/dL (ref 30.0–36.0)
MCV: 80.2 fL (ref 80.0–100.0)
Platelets: 193 10*3/uL (ref 150–400)
RBC: 3.48 MIL/uL — ABNORMAL LOW (ref 3.87–5.11)
RDW: 14.3 % (ref 11.5–15.5)
WBC: 10.3 10*3/uL (ref 4.0–10.5)
nRBC: 0 % (ref 0.0–0.2)

## 2019-11-04 LAB — RENAL FUNCTION PANEL
Albumin: 1.8 g/dL — ABNORMAL LOW (ref 3.5–5.0)
Albumin: 1.5 g/dL — ABNORMAL LOW (ref 3.5–5.0)
Anion gap: 11 (ref 5–15)
Anion gap: 9 (ref 5–15)
BUN: 30 mg/dL — ABNORMAL HIGH (ref 6–20)
BUN: 22 mg/dL — ABNORMAL HIGH (ref 6–20)
CO2: 25 mmol/L (ref 22–32)
CO2: 24 mmol/L (ref 22–32)
Calcium: 7.9 mg/dL — ABNORMAL LOW (ref 8.9–10.3)
Calcium: 7.5 mg/dL — ABNORMAL LOW (ref 8.9–10.3)
Chloride: 95 mmol/L — ABNORMAL LOW (ref 98–111)
Chloride: 96 mmol/L — ABNORMAL LOW (ref 98–111)
Creatinine, Ser: 0.77 mg/dL (ref 0.44–1.00)
Creatinine, Ser: 0.66 mg/dL (ref 0.44–1.00)
GFR, Estimated: >60 mL/min
GFR, Estimated: >60 mL/min
Glucose, Bld: 105 mg/dL — ABNORMAL HIGH (ref 70–99)
Glucose, Bld: 134 mg/dL — ABNORMAL HIGH (ref 70–99)
Phosphorus: 3.6 mg/dL (ref 2.5–4.6)
Phosphorus: 2.6 mg/dL (ref 2.5–4.6)
Potassium: 3.8 mmol/L (ref 3.5–5.1)
Potassium: 3.5 mmol/L (ref 3.5–5.1)
Sodium: 131 mmol/L — ABNORMAL LOW (ref 135–145)
Sodium: 129 mmol/L — ABNORMAL LOW (ref 135–145)

## 2019-11-04 LAB — HIV ANTIBODY (ROUTINE TESTING W REFLEX): HIV Screen 4th Generation wRfx: NONREACTIVE

## 2019-11-04 LAB — ECHOCARDIOGRAM COMPLETE
Area-P 1/2: 3.37 cm2
Height: 62 in
S' Lateral: 2.31 cm
Weight: 1680.01 oz

## 2019-11-04 LAB — BLOOD CULTURE ID PANEL (REFLEXED) - BCID2

## 2019-11-04 LAB — HEPATIC FUNCTION PANEL
ALT: 23 U/L (ref 0–44)
AST: 29 U/L (ref 15–41)
Albumin: 1.7 g/dL — ABNORMAL LOW (ref 3.5–5.0)
Alkaline Phosphatase: 103 U/L (ref 38–126)
Bilirubin, Direct: 0.2 mg/dL (ref 0.0–0.2)
Indirect Bilirubin: 0.6 mg/dL (ref 0.3–0.9)
Total Bilirubin: 0.8 mg/dL (ref 0.3–1.2)
Total Protein: 6 g/dL — ABNORMAL LOW (ref 6.5–8.1)

## 2019-11-04 LAB — PROCALCITONIN: Procalcitonin: 12.02 ng/mL

## 2019-11-04 LAB — CORTISOL-AM, BLOOD: Cortisol - AM: 14.9 ug/dL (ref 6.7–22.6)

## 2019-11-04 LAB — HEPATITIS PANEL, ACUTE
HCV Ab: REACTIVE — AB
Hep A IgM: NONREACTIVE
Hep B C IgM: NONREACTIVE
Hepatitis B Surface Ag: NONREACTIVE

## 2019-11-04 LAB — PROTIME-INR
INR: 1.5 — ABNORMAL HIGH (ref 0.8–1.2)
Prothrombin Time: 18 seconds — ABNORMAL HIGH (ref 11.4–15.2)

## 2019-11-04 IMAGING — MR MR HEAD W/O CM
11 of 12 series · 44 of 48 positions shown · non-contrast
Comparison: Head CT [DATE].

CLINICAL DATA: Bacteremia.  Rule out septic emboli.

EXAM:
MRI HEAD WITHOUT CONTRAST
TECHNIQUE: Multiplanar, multiecho pulse sequences of the brain and surrounding
structures were obtained without intravenous contrast.

[Series 5: DWI · axial · 3.0mm · 0.88mm/px · z∈[-99,+53]mm · 9 of 104 slices shown (1 of 4)]
[im 1/104]
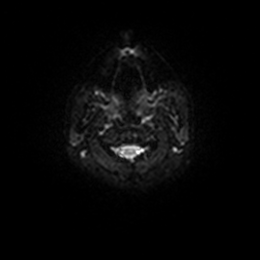
[im 13/104]
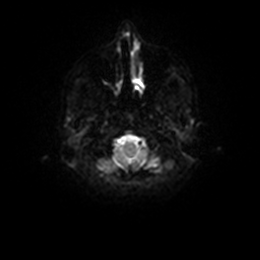
[im 26/104]
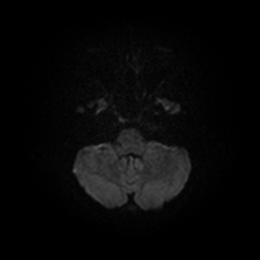
[im 39/104]
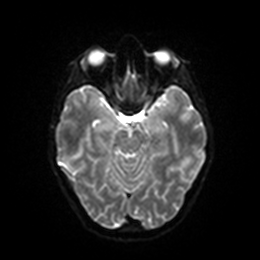
[im 52/104]
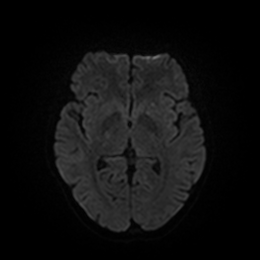
[im 65/104]
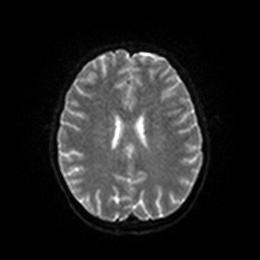
[im 78/104]
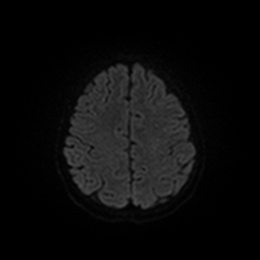
[im 91/104]
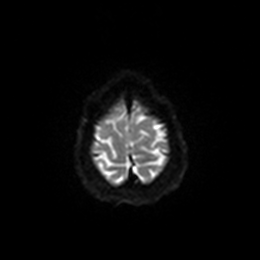
[im 104/104]
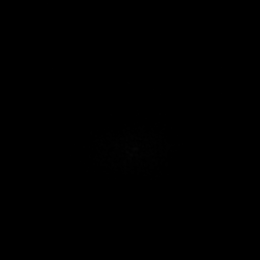

[Series 6: DWI · axial · 3.0mm · 0.88mm/px · z∈[-99,+50]mm · 4 of 50 slices shown (2 of 4)]
[im 1/50]
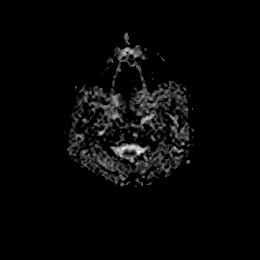
[im 17/50]
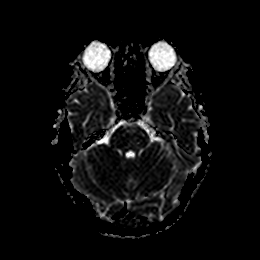
[im 33/50]
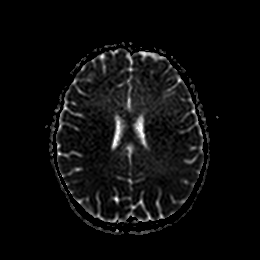
[im 50/50]
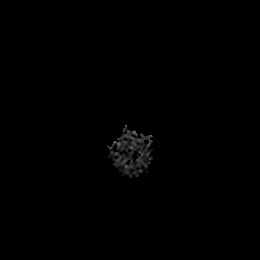

[Series 7: DWI · coronal · 4.0mm · 0.88mm/px · 6 of 72 slices shown (3 of 4)]
[im 1/72]
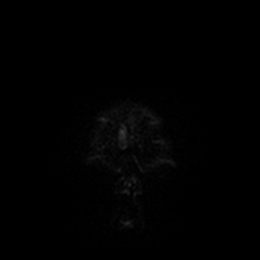
[im 15/72]
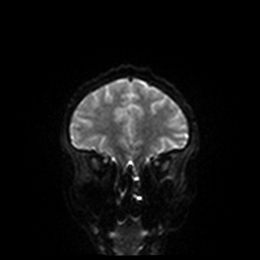
[im 29/72]
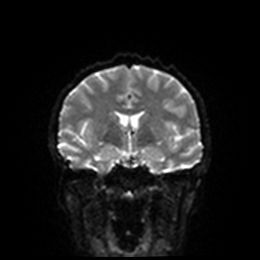
[im 43/72]
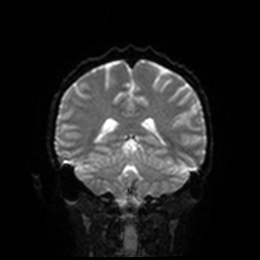
[im 57/72]
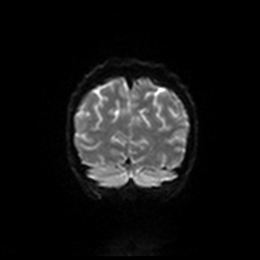
[im 72/72]
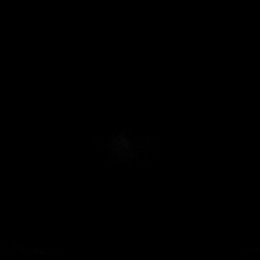

[Series 8: DWI · coronal · 4.0mm · 0.88mm/px · 3 of 35 slices shown (4 of 4)]
[im 1/35]
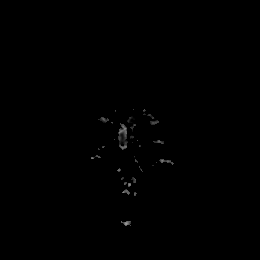
[im 18/35]
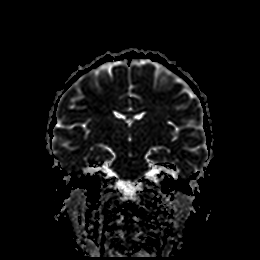
[im 35/35]
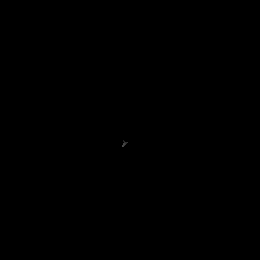

[Series 9: T1 · sagittal · 5.0mm · 0.75mm/px · 2 of 25 slices shown]
[im 1/25]
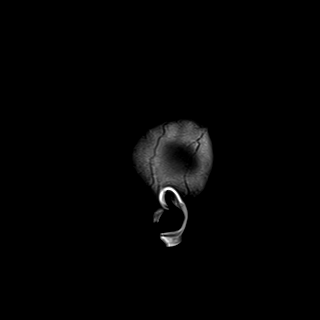
[im 25/25]
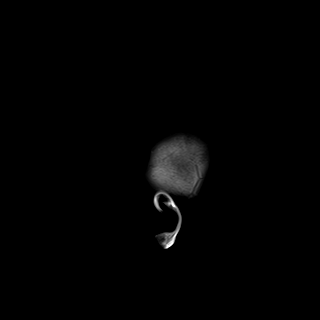

[Series 10: T2 · axial · 5.0mm · 0.72mm/px · z∈[-94,+50]mm · 2 of 25 slices shown]
[im 1/25]
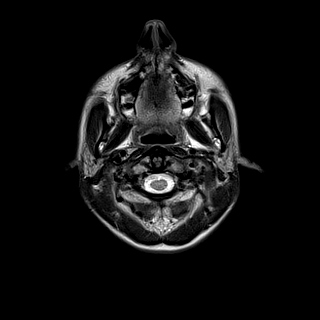
[im 25/25]
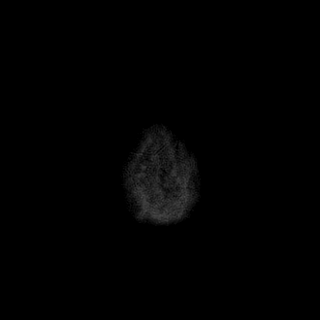

[Series 11: FLAIR · axial · 5.0mm · 0.45mm/px · z∈[-93,+50]mm · 2 of 25 slices shown]
[im 1/25]
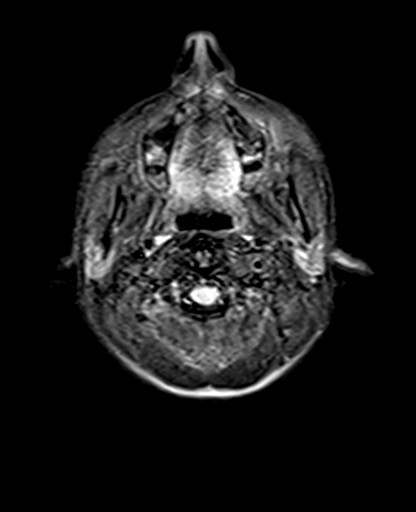
[im 25/25]
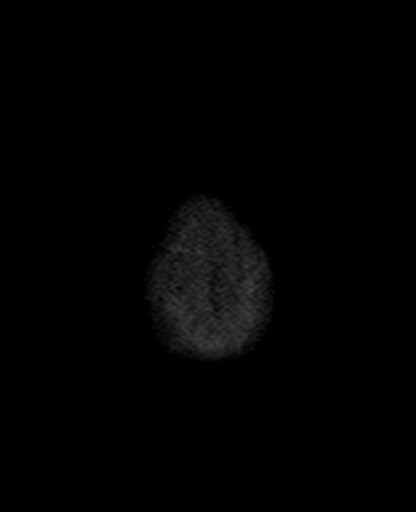

[Series 12: mag_images · axial · 3.0mm · 0.90mm/px · z∈[-100,+52]mm · 4 of 52 slices shown]
[im 1/52]
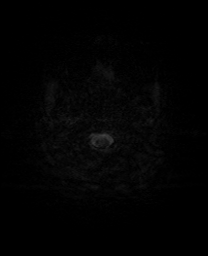
[im 18/52]
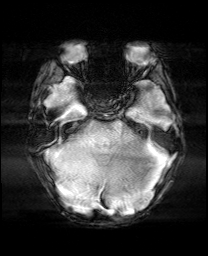
[im 35/52]
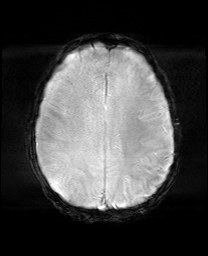
[im 52/52]
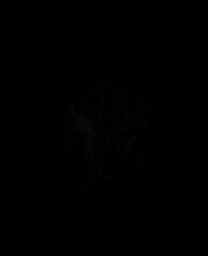

[Series 13: pha_images · axial · 3.0mm · 0.90mm/px · z∈[-100,+52]mm · 4 of 51 slices shown]
[im 1/51]
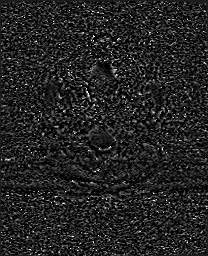
[im 17/51]
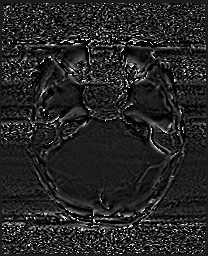
[im 34/51]
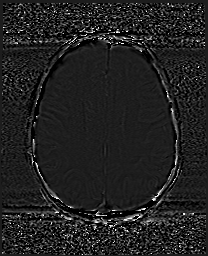
[im 51/51]
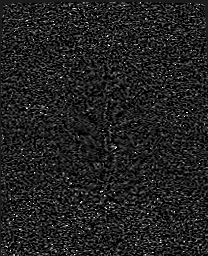

[Series 14: swi_images · axial · 3.0mm · 0.90mm/px · z∈[-100,+52]mm · 4 of 52 slices shown]
[im 1/52]
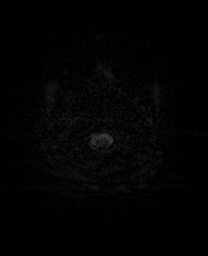
[im 18/52]
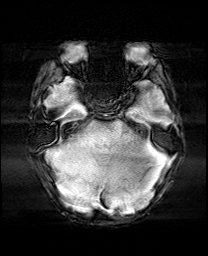
[im 35/52]
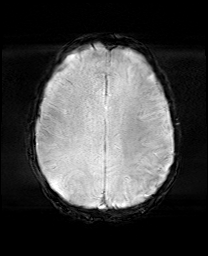
[im 52/52]
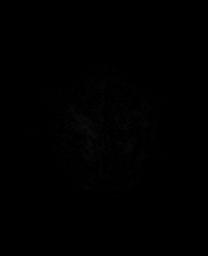

[Series 15: mip_images(sw) · axial · 24.0mm · 0.90mm/px · z∈[-89,+42]mm · 4 of 45 slices shown]
[im 1/45]
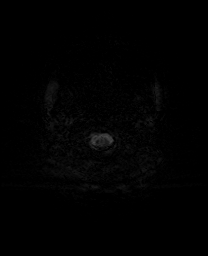
[im 15/45]
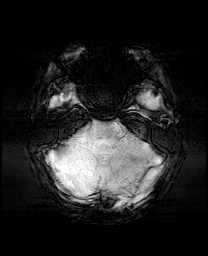
[im 30/45]
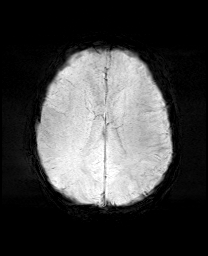
[im 45/45]
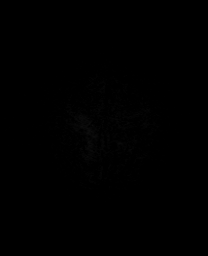

[44 of 48 positions shown; findings below may reference images not displayed]

FINDINGS: Brain: No acute infarction, hemorrhage, hydrocephalus, extra-axial
collection or mass lesion.

The brain parenchyma has normal morphology and signal
characteristics.

Vascular: Normal flow voids.

Skull and upper cervical spine: Diffuse decrease of the T1 signal
within the visualized upper cervical spine, calvarium and mandible
may represent red marrow reconversion. Correlate with CBC.

Sinuses/Orbits: Minimal mucosal thickening of the right maxillary
sinus. The orbits are maintained.

Other: None.
IMPRESSION: 1. No acute intracranial abnormality. Unremarkable MRI of the brain.
2. Diffuse decrease of the T1 signal within the visualized upper
cervical spine, calvarium and mandible may represent red marrow
reconversion. Correlate with CBC.

## 2019-11-04 IMAGING — MR MR THORACIC SPINE W/O CM
7 of 8 series · 34 of 48 positions shown · non-contrast
Comparison: Chest CT yesterday.

CLINICAL DATA: Bacteremia.  Back pain.

EXAM:
MRI THORACIC AND LUMBAR SPINE WITHOUT CONTRAST
TECHNIQUE: Multiplanar and multiecho pulse sequences of the thoracic and lumbar
spine were obtained without intravenous contrast. The patient
refused contrast administration.

[Series 20: T1 · sagittal · 4.0mm · 1.72mm/px · 3 of 12 slices shown (1 of 3)]
[im 1/12]
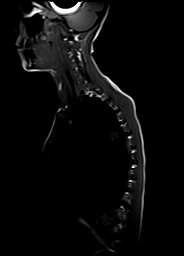
[im 6/12]
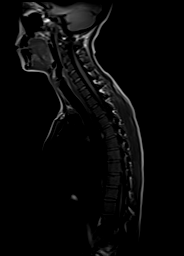
[im 12/12]
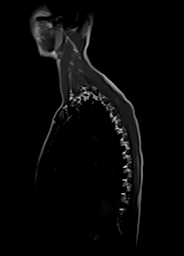

[Series 21: STIR · sagittal · 3.0mm · 0.94mm/px · 4 of 17 slices shown]
[im 1/17]
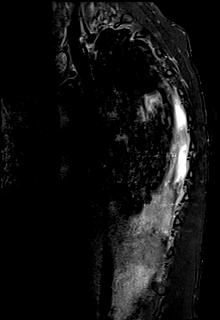
[im 6/17]
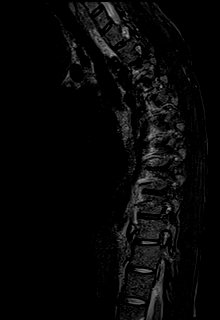
[im 11/17]
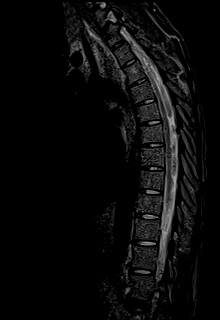
[im 17/17]
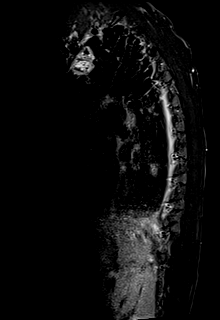

[Series 22: T2 · sagittal · 3.0mm · 0.78mm/px · 4 of 17 slices shown (1 of 2)]
[im 1/17]
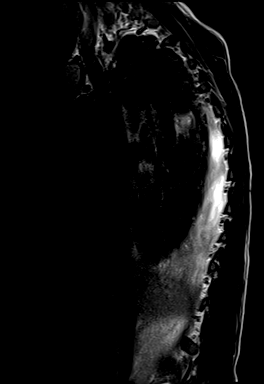
[im 6/17]
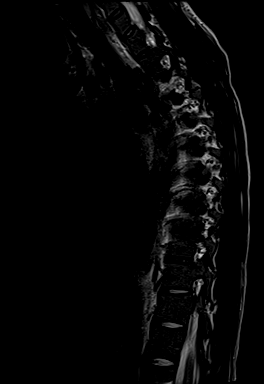
[im 11/17]
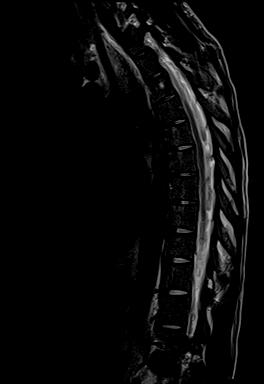
[im 17/17]
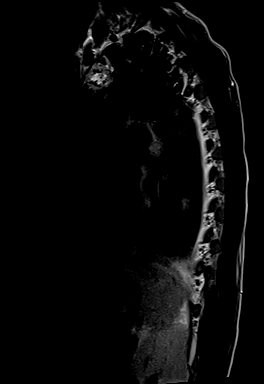

[Series 23: T1 · sagittal · 3.0mm · 0.94mm/px · 4 of 17 slices shown (2 of 3)]
[im 1/17]
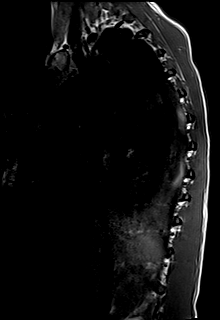
[im 6/17]
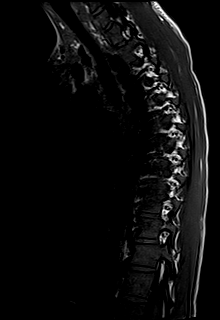
[im 11/17]
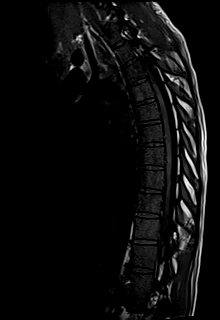
[im 17/17]
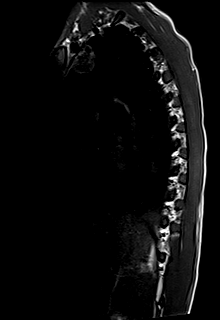

[Series 24: T2 · axial · 4.0mm · 0.78mm/px · z∈[-381,-190]mm · 8 of 39 slices shown (2 of 2)]
[im 1/39]
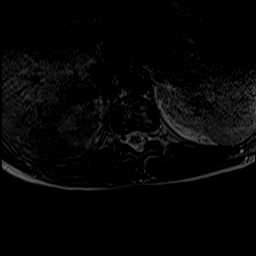
[im 5/39]
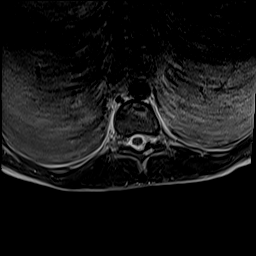
[im 13/39]
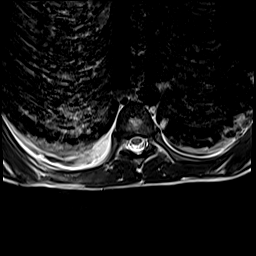
[im 17/39]
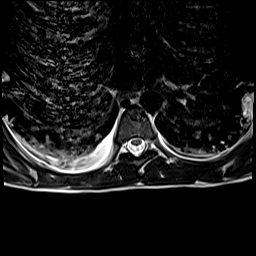
[im 22/39]
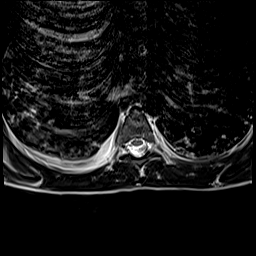
[im 26/39]
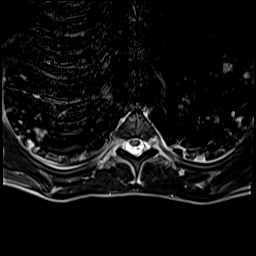
[im 34/39]
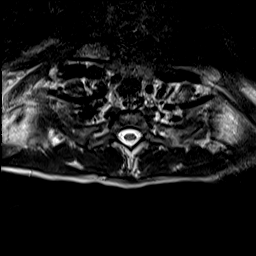
[im 39/39]
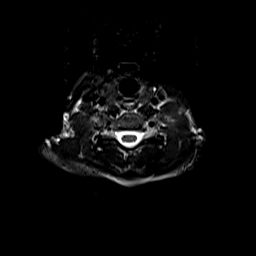

[Series 25: t2_me2d_tra thoracic · axial · 4.0mm · 0.39mm/px · z∈[-381,-293]mm · 3 of 39 slices shown]
[im 1/39]
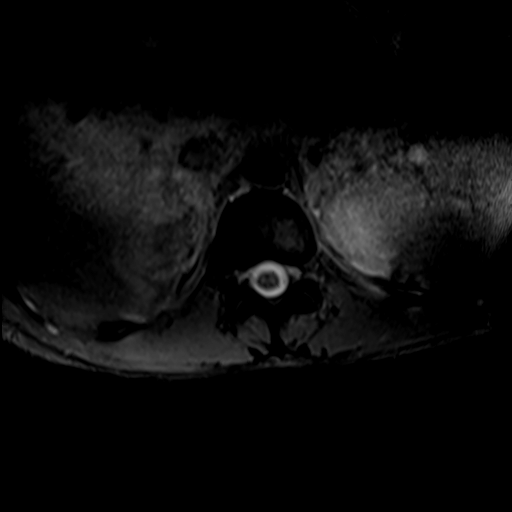
[im 5/39]
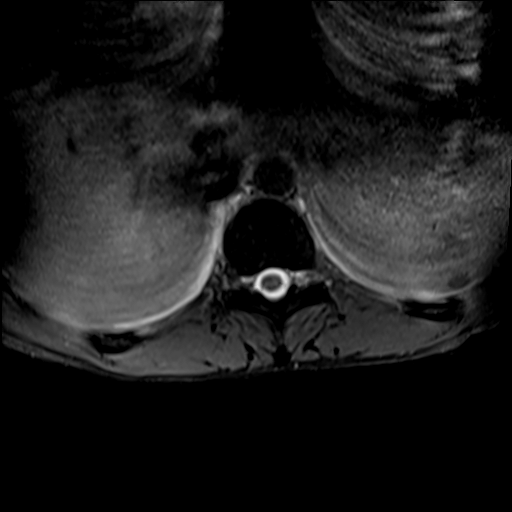
[im 13/39]
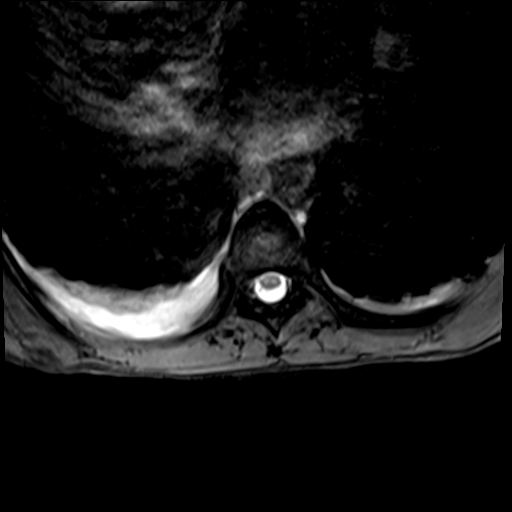

[Series 26: T1 · axial · 4.0mm · 0.39mm/px · z∈[-381,-190]mm · 8 of 39 slices shown (3 of 3)]
[im 1/39]
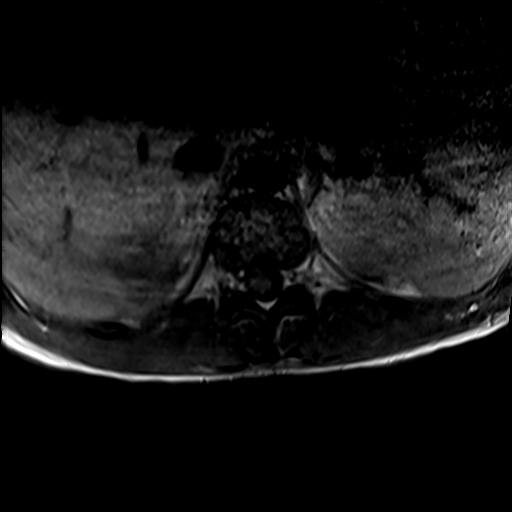
[im 5/39]
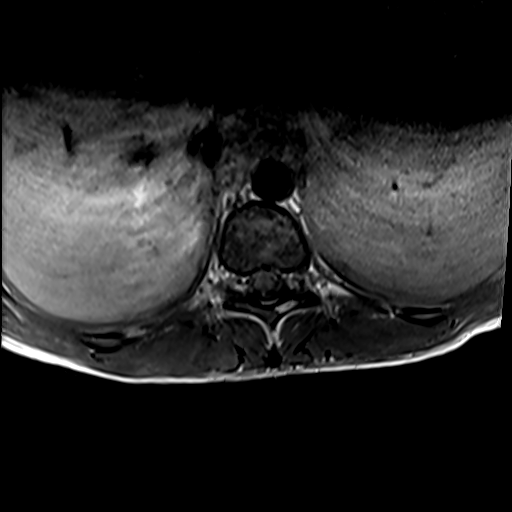
[im 13/39]
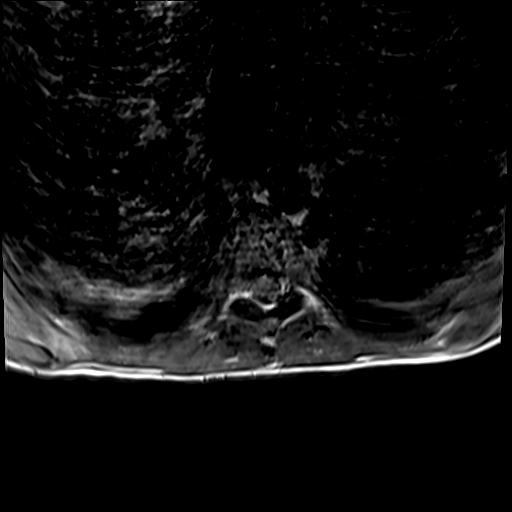
[im 17/39]
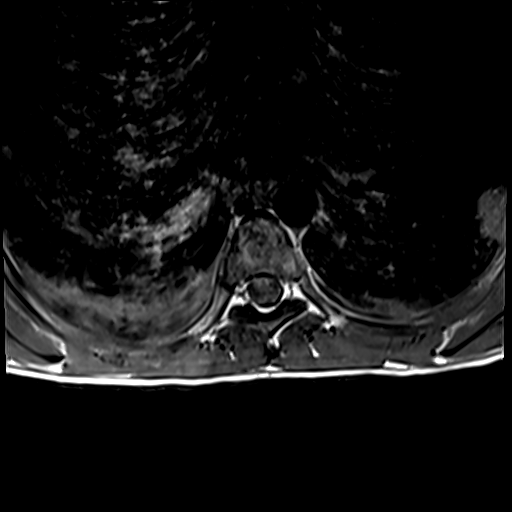
[im 22/39]
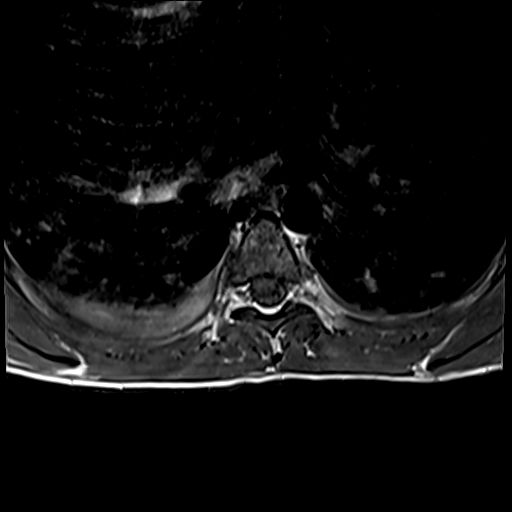
[im 26/39]
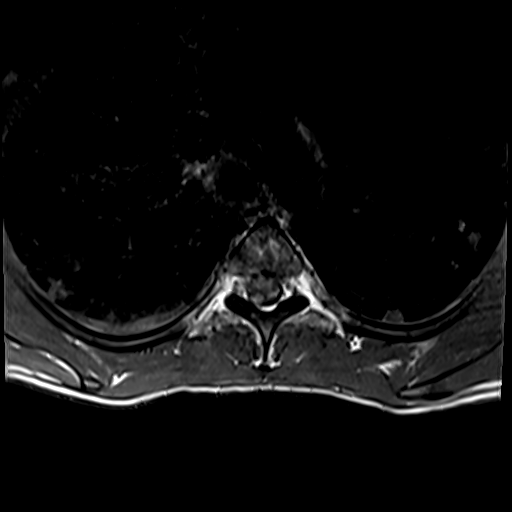
[im 34/39]
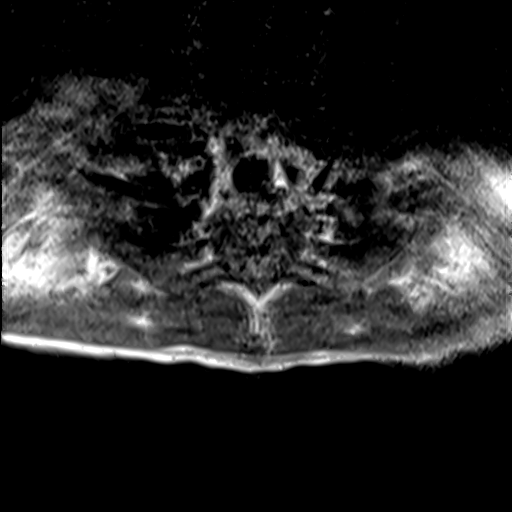
[im 39/39]
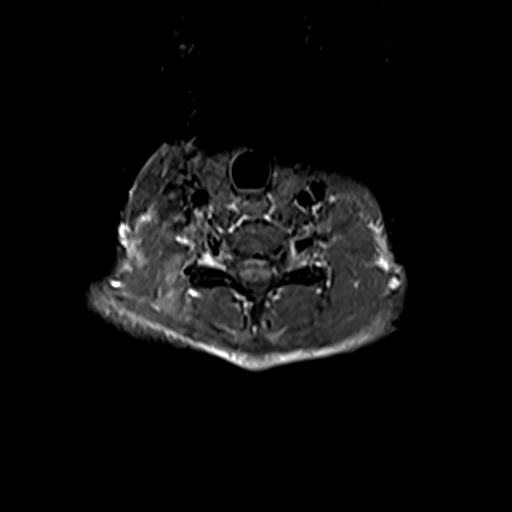

[34 of 48 positions shown; findings below may reference images not displayed]

FINDINGS: MRI THORACIC SPINE FINDINGS

Alignment:  Normal

Vertebrae: Normal

Cord:  Normal

Paraspinal and other soft tissues: Small effusions layering
dependently, right more than left, with dependent atelectasis in the
lower lobes right more than left. Patchy infiltrates as were shown
by CT.

Disc levels:

No degenerative disc disease. No stenosis of the canal or foramina.
No evidence of discitis osteomyelitis or infected facet joint.

MRI LUMBAR SPINE FINDINGS

Segmentation:  5 lumbar type vertebral bodies.

Alignment:  Normal

Vertebrae:  Vertebral bodies are normal.

Conus medullaris: Extends to the L1-2 level and appears normal.

Paraspinal and other soft tissues: Negative

Disc levels:

No evidence of degenerative disc disease or disc space infection.
Patient has some edema associated with the facet joints at L4-5,
without frank joint effusions. This could be degenerative in nature
or could reflect the earliest changes of facet joint infection.
IMPRESSION: 1. No evidence of discitis osteomyelitis or infected facet joint in
the thoracic region.
2. No evidence of degenerative disc disease or disc space infection
in the lumbar region. There is some edema associated with the facet
joints at L4-5, without frank joint effusions. This could be
degenerative in nature or could reflect the earliest changes of
facet joint infection.
3. Small pleural effusions layering dependently, right more than
left, with dependent atelectasis in the lower lobes right more than
left. Focal pulmonary infiltrates as shown by CT yesterday.

## 2019-11-04 IMAGING — CT CT FOOT*L* W/CM
3 of 5 series · 12 of 33 positions shown, 14 images · IV contrast (omnipaque)
Comparison: None.

CLINICAL DATA: Foot swelling bacteremia

EXAM:
CT OF THE RIGHT ANKLE WITH CONTRAST
TECHNIQUE: Multidetector CT imaging of the right ankle was performed following
the standard protocol during bolus administration of intravenous
contrast.
CONTRAST:  100mL OMNIPAQUE IOHEXOL 300 MG/ML  SOLN

[Series 3: lower ext (id) bone lt foot · axial · 0.41mm/px · z∈[-105,+75]mm · 5 of 181 slices shown, 7 images]
[im 31/181  soft-tissue]
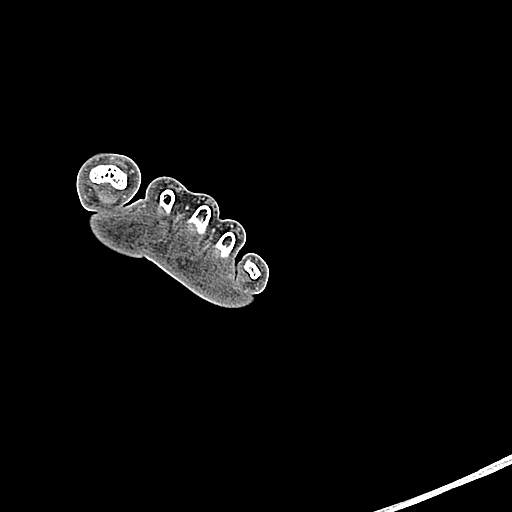
[im 31/181  bone]
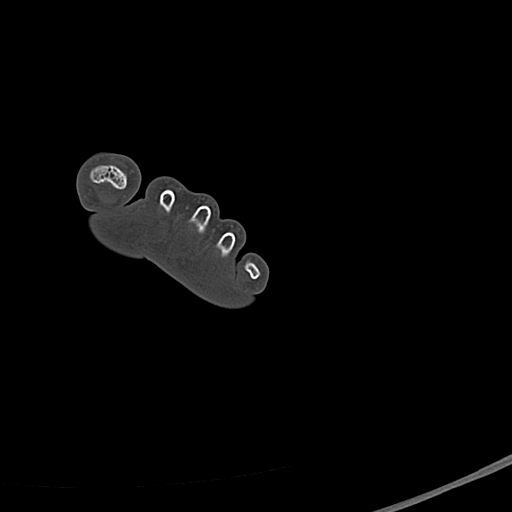
[im 61/181  bone]
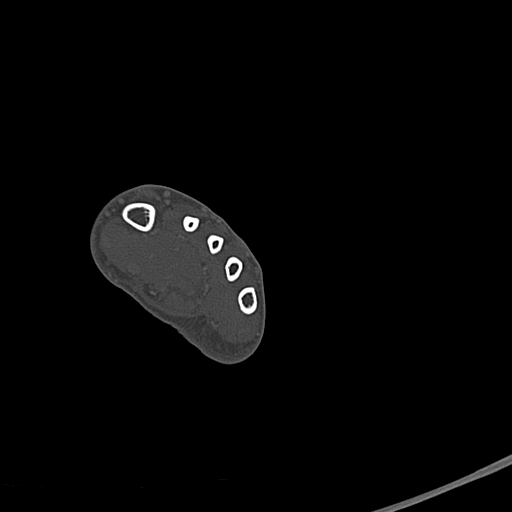
[im 91/181  bone]
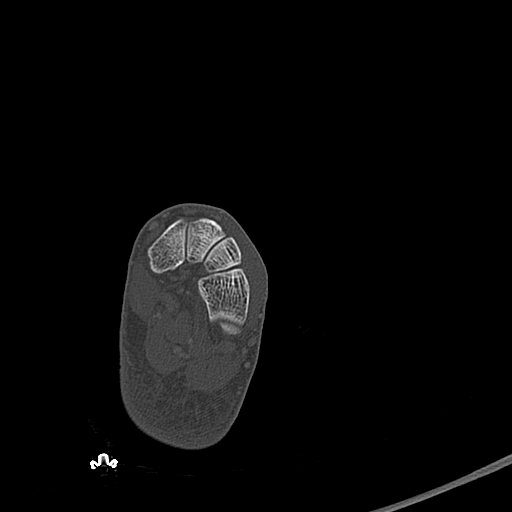
[im 121/181  bone]
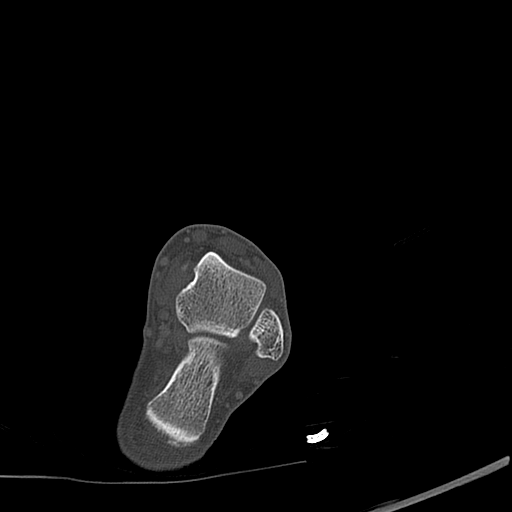
[im 151/181  soft-tissue]
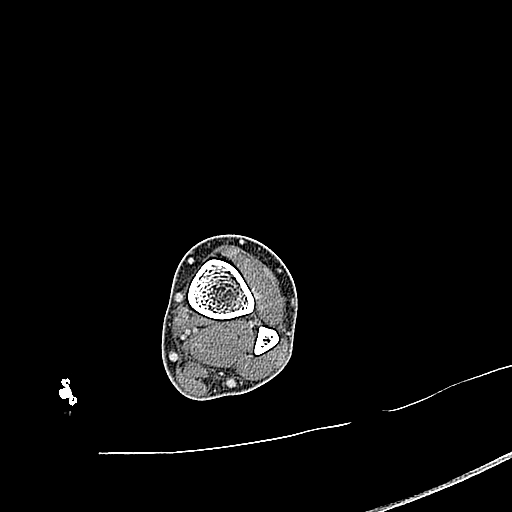
[im 151/181  bone]
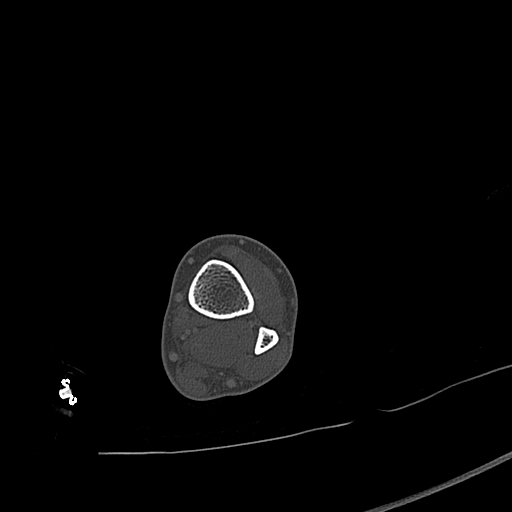

[Series 6: lower ext sag bone lt foot · sagittal · 0.30mm/px · 4 of 52 slices shown]
[im 11/52  bone]
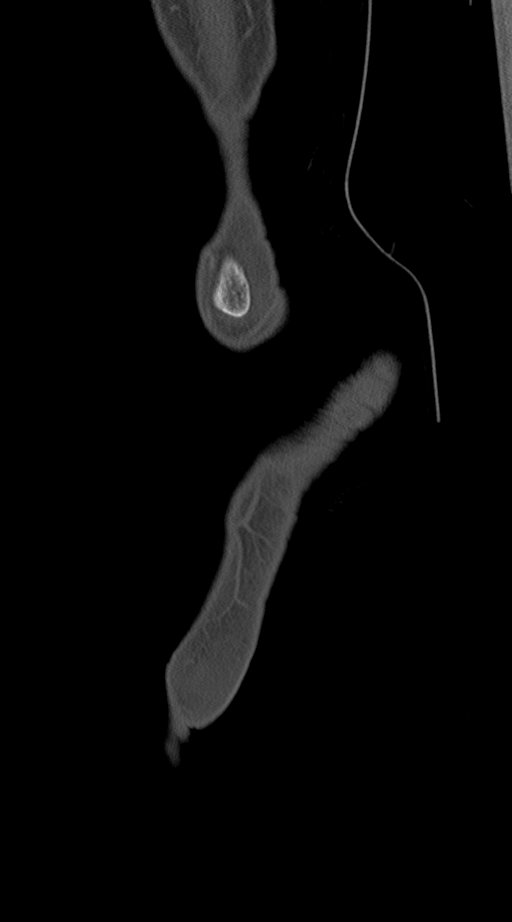
[im 21/52  bone]
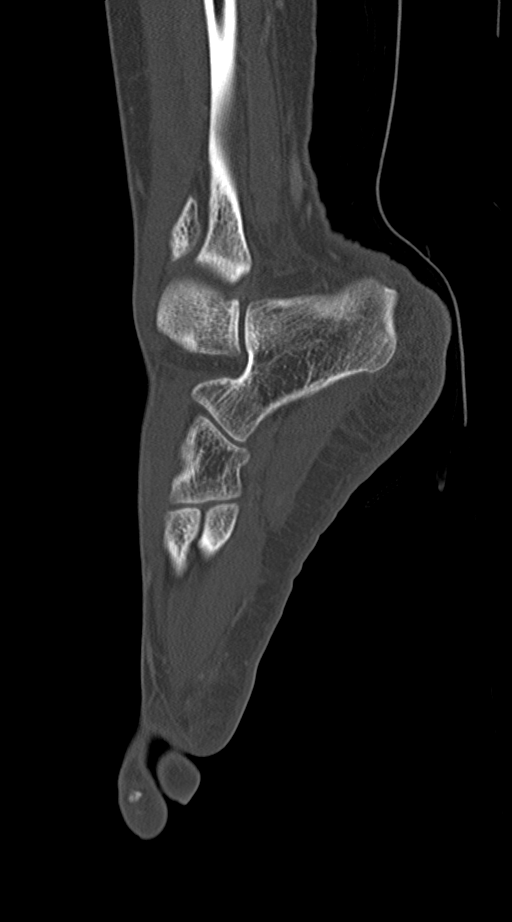
[im 31/52  bone]
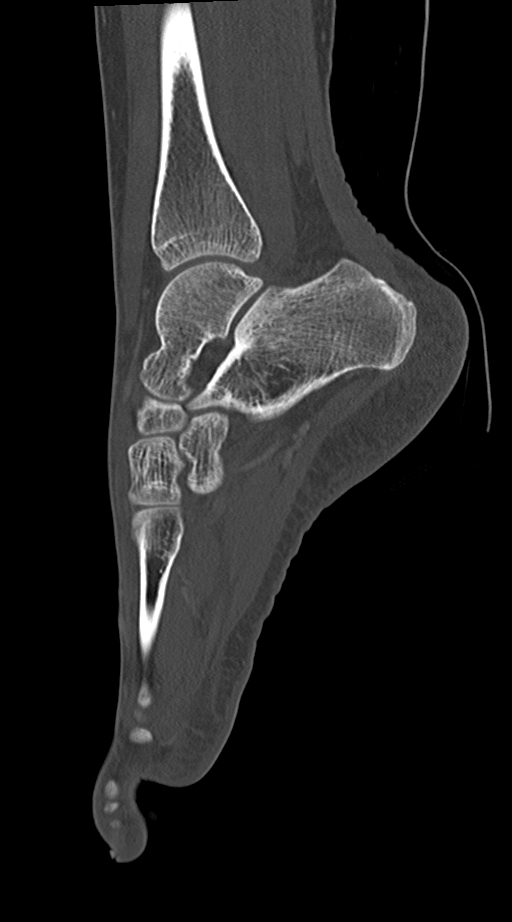
[im 41/52  bone]
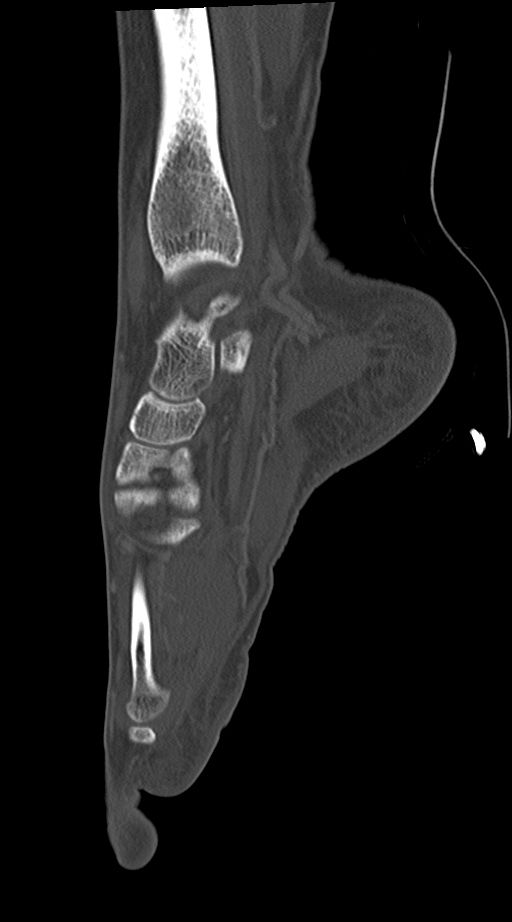

[Series 7: lower ext cor st lt foot · coronal · 0.37mm/px · 3 of 102 slices shown]
[im 21/102  bone]
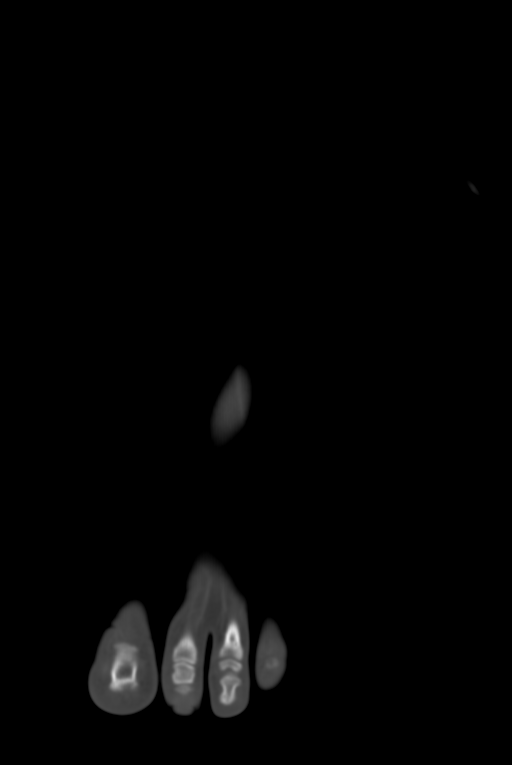
[im 41/102  bone]
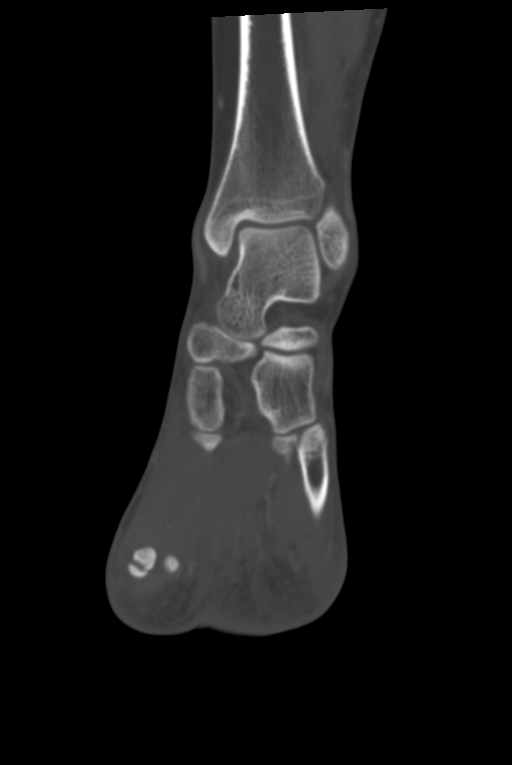
[im 61/102  bone]
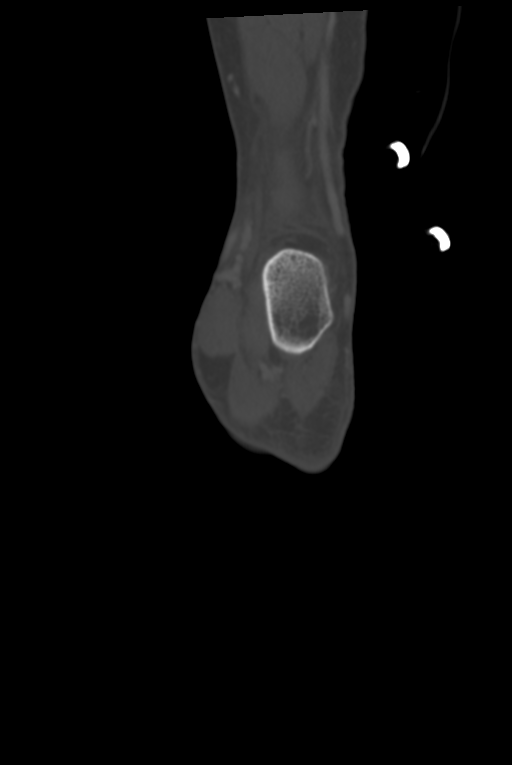

[12 of 33 positions shown; findings below may reference images not displayed]

FINDINGS: Bones/Joint/Cartilage

No fracture or dislocation. No areas of cortical destruction or
periosteal reaction are seen. The articular surfaces appear
maintained. No large ankle joint effusion.

Ligaments

Suboptimally assessed by CT.

Muscles and Tendons

The muscles surrounding the ankle and foot appear to be intact
without focal atrophy or tear. The flexor and extensor tendons are
intact. The Achilles tendon is intact.

Soft tissues: There is subcutaneous edema seen along the posterior
heel pad with mild skin thickening. There also appears to be mildly
heterogeneous density with thickening in the middle cord of the
plantar fascia, however it is intact. No loculated fluid collections
are noted.
IMPRESSION: Findings which could be suggestive of diffuse cellulitis along the
heel pad. No loculated fluid collections or definite evidence of
osteomyelitis.

## 2019-11-04 IMAGING — MR MR LUMBAR SPINE W/O CM
5 series · 33 of 48 positions shown · non-contrast
Comparison: Chest CT yesterday.

CLINICAL DATA: Bacteremia.  Back pain.

EXAM:
MRI THORACIC AND LUMBAR SPINE WITHOUT CONTRAST
TECHNIQUE: Multiplanar and multiecho pulse sequences of the thoracic and lumbar
spine were obtained without intravenous contrast. The patient
refused contrast administration.

[Series 27: T1 · sagittal · 4.0mm · 0.75mm/px · 7 of 15 slices shown (1 of 2)]
[im 1/15]
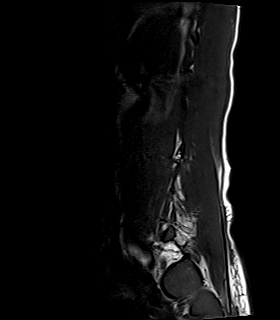
[im 3/15]
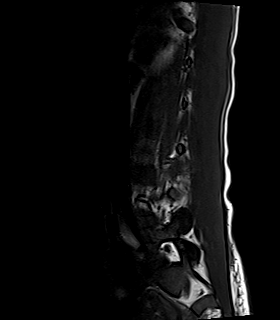
[im 5/15]
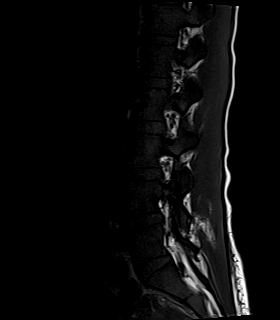
[im 8/15]
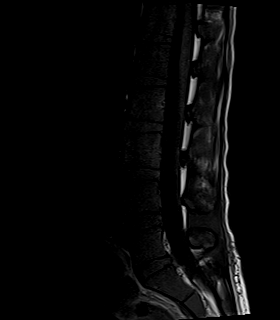
[im 10/15]
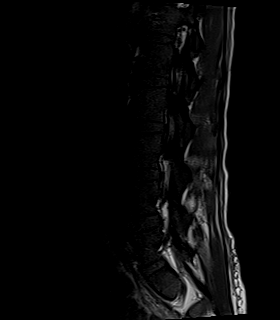
[im 12/15]
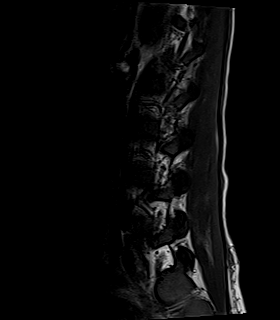
[im 15/15]
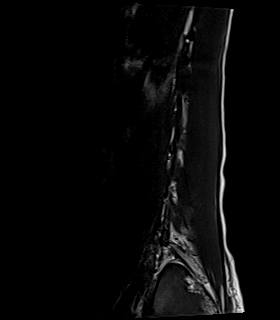

[Series 28: T2 · sagittal · 4.0mm · 0.75mm/px · 7 of 15 slices shown (1 of 2)]
[im 1/15]
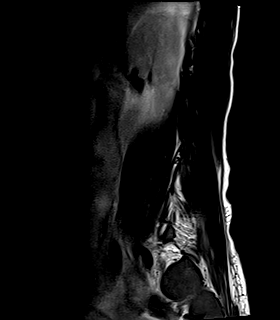
[im 3/15]
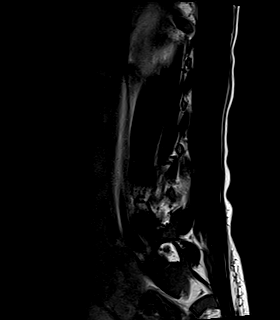
[im 5/15]
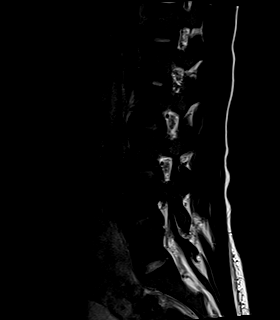
[im 8/15]
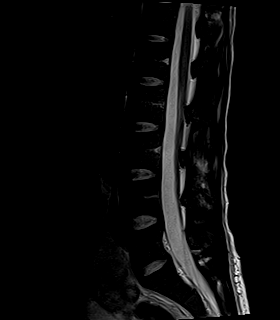
[im 10/15]
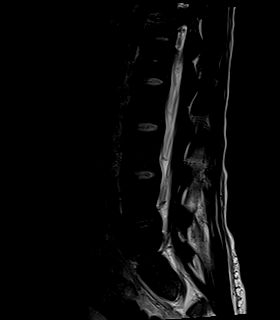
[im 12/15]
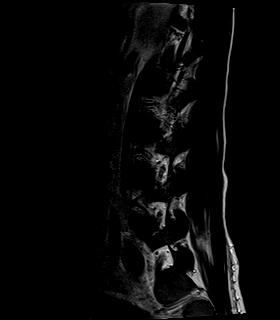
[im 15/15]
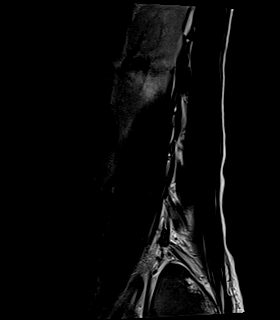

[Series 29: STIR · sagittal · 4.0mm · 0.47mm/px · 3 of 15 slices shown]
[im 1/15]
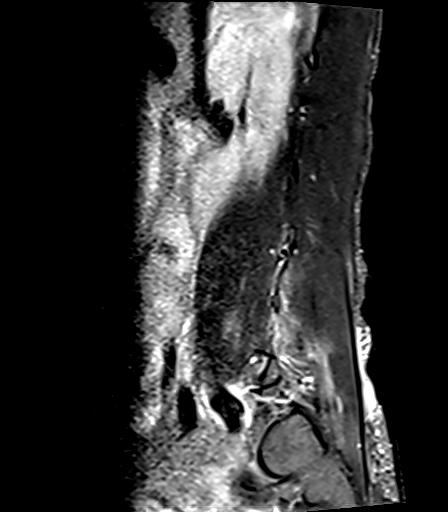
[im 3/15]
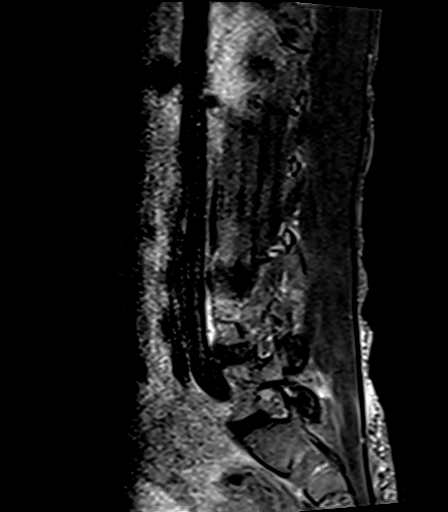
[im 6/15]
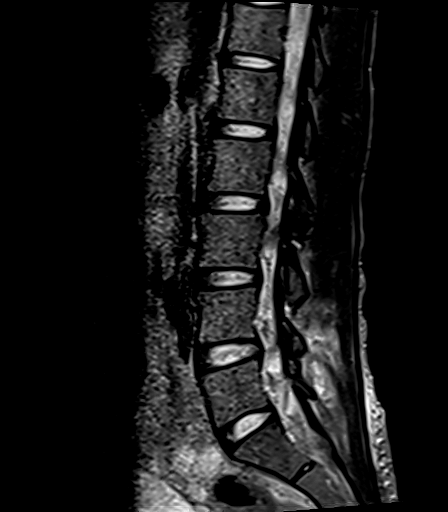

[Series 30: T2 · axial · 4.0mm · 0.62mm/px · z∈[-599,-429]mm · 8 of 34 slices shown (2 of 2)]
[im 1/34]
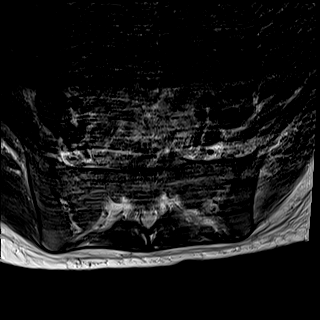
[im 6/34]
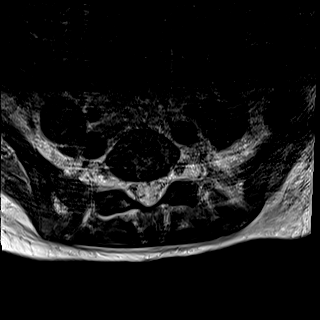
[im 11/34]
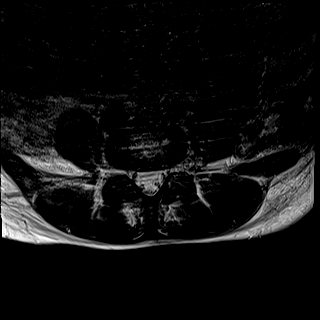
[im 16/34]
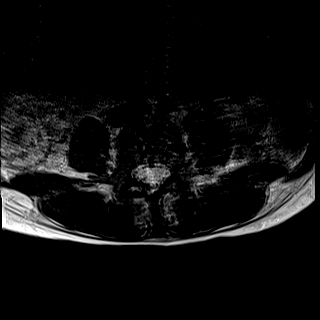
[im 18/34]
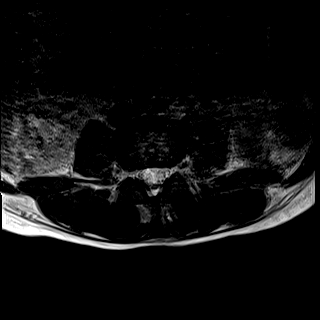
[im 23/34]
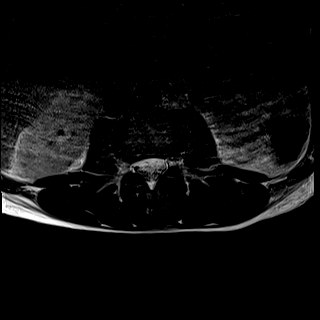
[im 28/34]
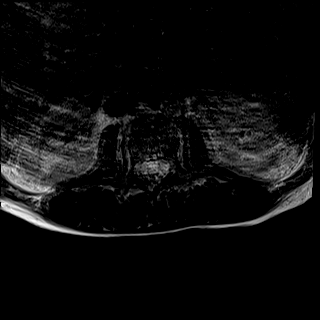
[im 34/34]
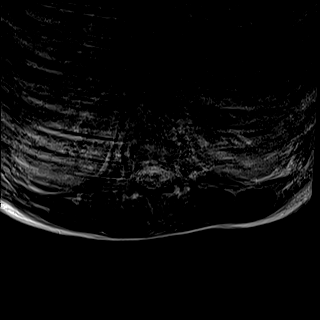

[Series 31: T1 · axial · 4.0mm · 0.39mm/px · z∈[-599,-429]mm · 8 of 34 slices shown (2 of 2)]
[im 1/34]
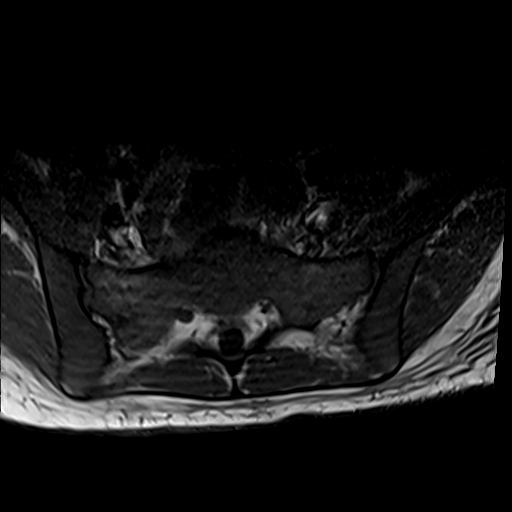
[im 6/34]
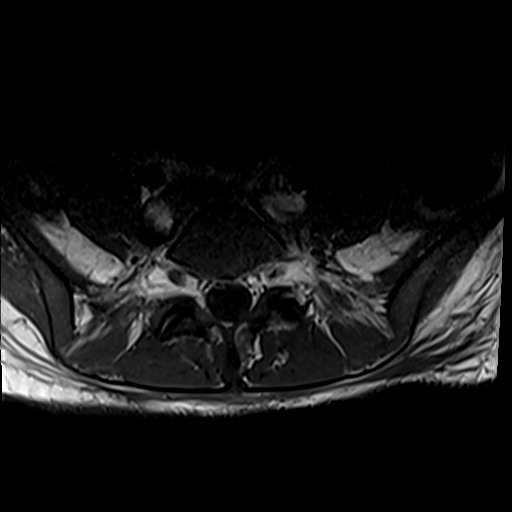
[im 11/34]
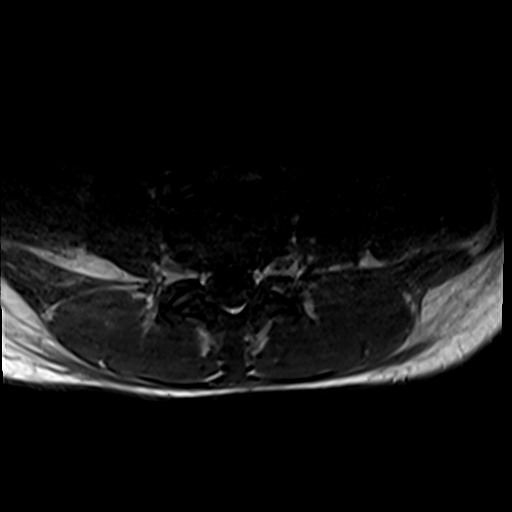
[im 16/34]
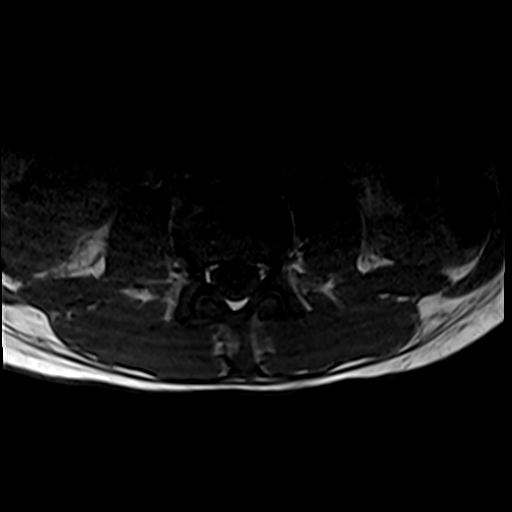
[im 18/34]
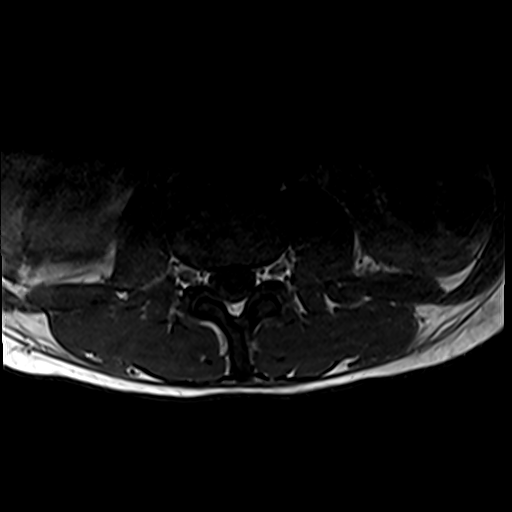
[im 23/34]
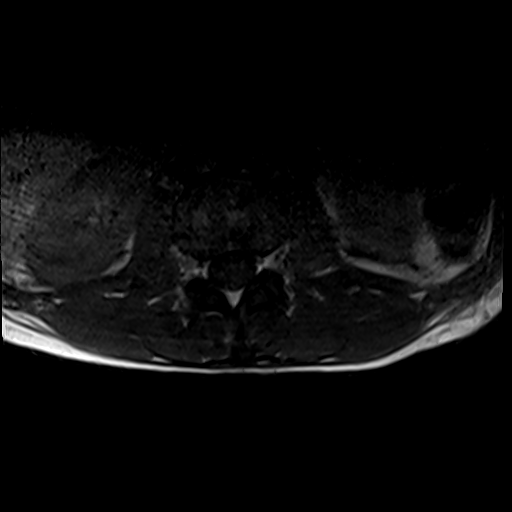
[im 28/34]
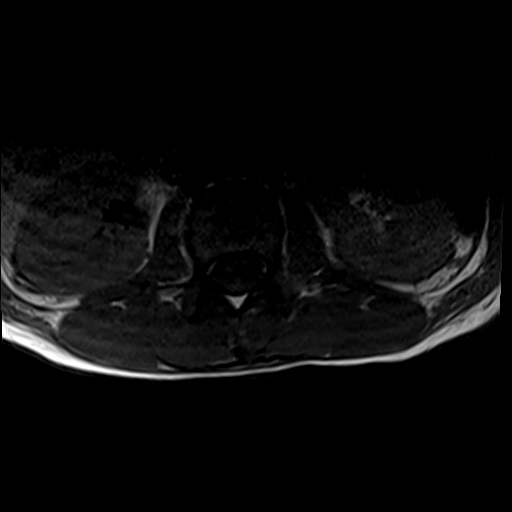
[im 34/34]
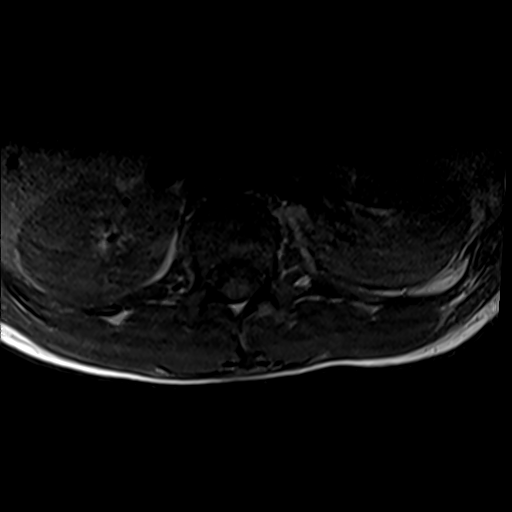

[33 of 48 positions shown; findings below may reference images not displayed]

FINDINGS: MRI THORACIC SPINE FINDINGS

Alignment:  Normal

Vertebrae: Normal

Cord:  Normal

Paraspinal and other soft tissues: Small effusions layering
dependently, right more than left, with dependent atelectasis in the
lower lobes right more than left. Patchy infiltrates as were shown
by CT.

Disc levels:

No degenerative disc disease. No stenosis of the canal or foramina.
No evidence of discitis osteomyelitis or infected facet joint.

MRI LUMBAR SPINE FINDINGS

Segmentation:  5 lumbar type vertebral bodies.

Alignment:  Normal

Vertebrae:  Vertebral bodies are normal.

Conus medullaris: Extends to the L1-2 level and appears normal.

Paraspinal and other soft tissues: Negative

Disc levels:

No evidence of degenerative disc disease or disc space infection.
Patient has some edema associated with the facet joints at L4-5,
without frank joint effusions. This could be degenerative in nature
or could reflect the earliest changes of facet joint infection.
IMPRESSION: 1. No evidence of discitis osteomyelitis or infected facet joint in
the thoracic region.
2. No evidence of degenerative disc disease or disc space infection
in the lumbar region. There is some edema associated with the facet
joints at L4-5, without frank joint effusions. This could be
degenerative in nature or could reflect the earliest changes of
facet joint infection.
3. Small pleural effusions layering dependently, right more than
left, with dependent atelectasis in the lower lobes right more than
left. Focal pulmonary infiltrates as shown by CT yesterday.

## 2019-11-04 IMAGING — CT CT ANKLE*R* W/CM
2 series · 15 of 27 positions shown, 19 images · IV contrast (omnipaque)
Comparison: None.

CLINICAL DATA: Foot swelling bacteremia

EXAM:
CT OF THE RIGHT ANKLE WITH CONTRAST
TECHNIQUE: Multidetector CT imaging of the right ankle was performed following
the standard protocol during bolus administration of intravenous
contrast.
CONTRAST:  100mL OMNIPAQUE IOHEXOL 300 MG/ML  SOLN

[Series 4: lower ext 1.5 st rt ankle · axial · 0.36mm/px · z∈[-30,+106]mm · 10 of 109 slices shown, 13 images]
[im 9/109  soft-tissue]
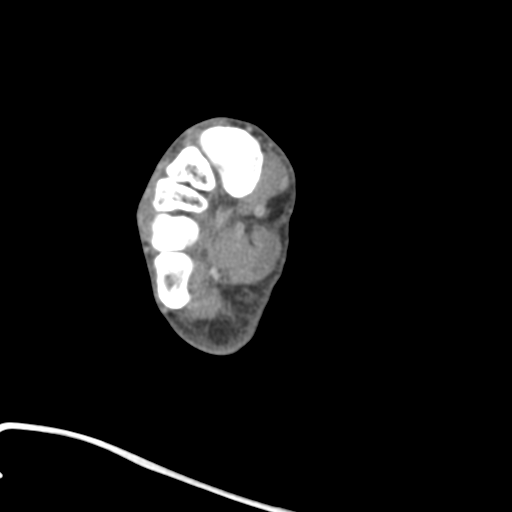
[im 9/109  bone]
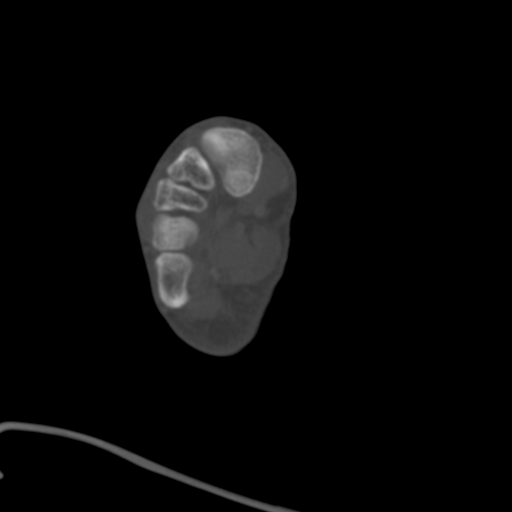
[im 17/109  bone]
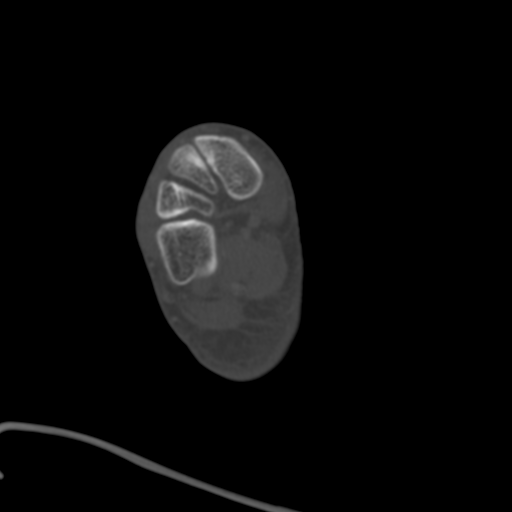
[im 34/109  bone]
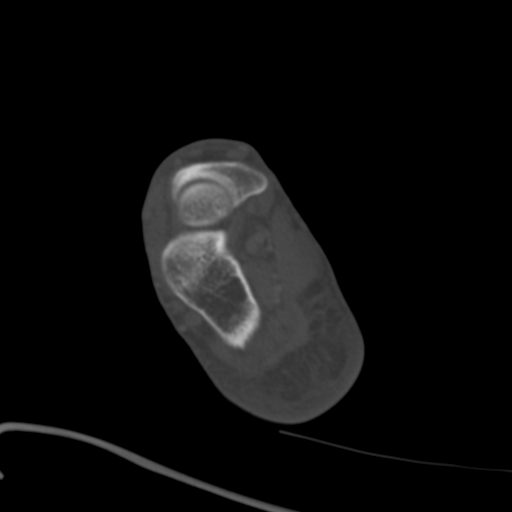
[im 42/109  bone]
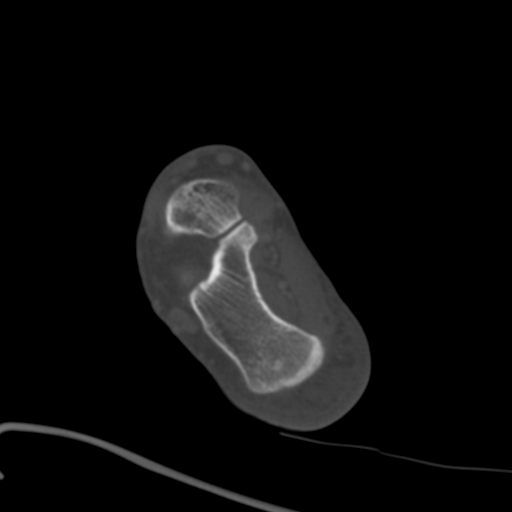
[im 50/109  soft-tissue]
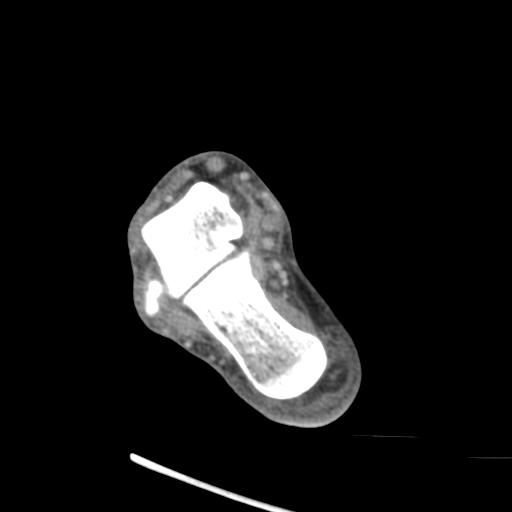
[im 50/109  bone]
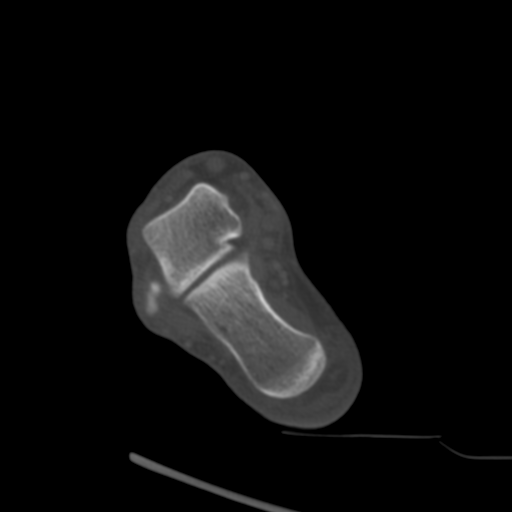
[im 59/109  bone]
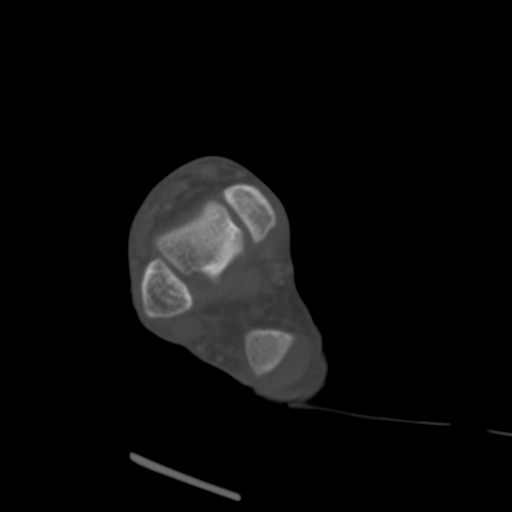
[im 67/109  bone]
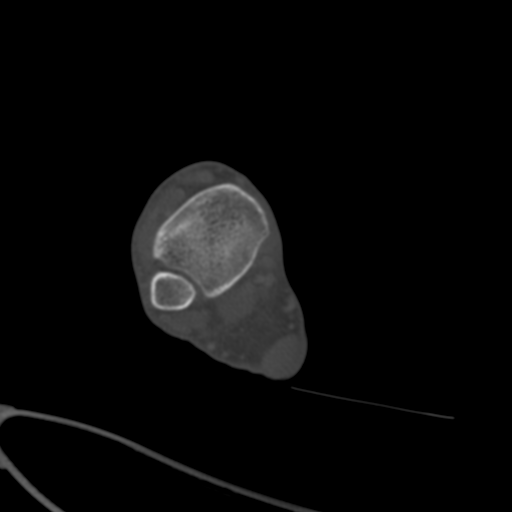
[im 84/109  bone]
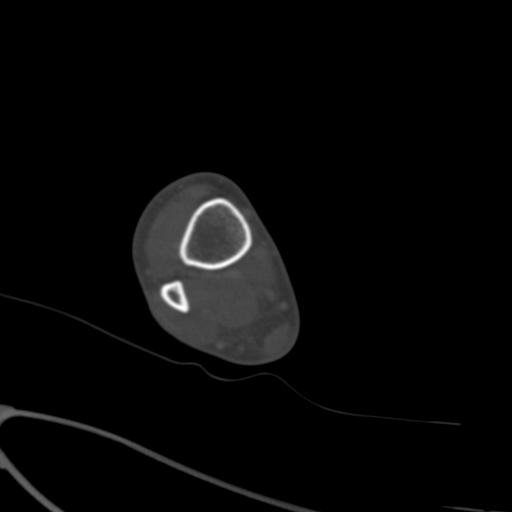
[im 92/109  soft-tissue]
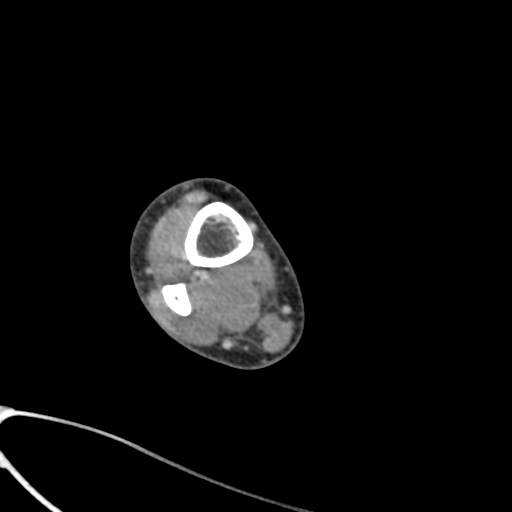
[im 92/109  bone]
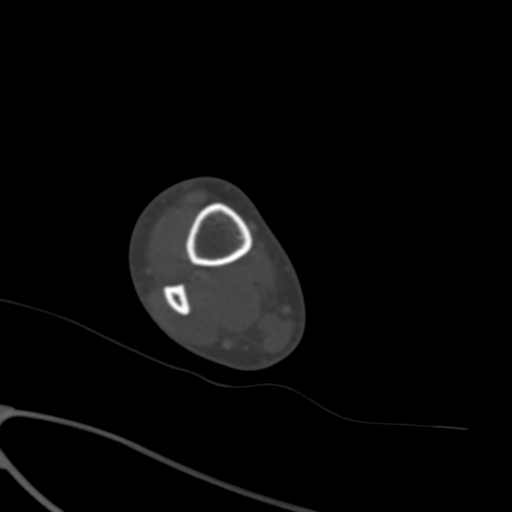
[im 100/109  bone]
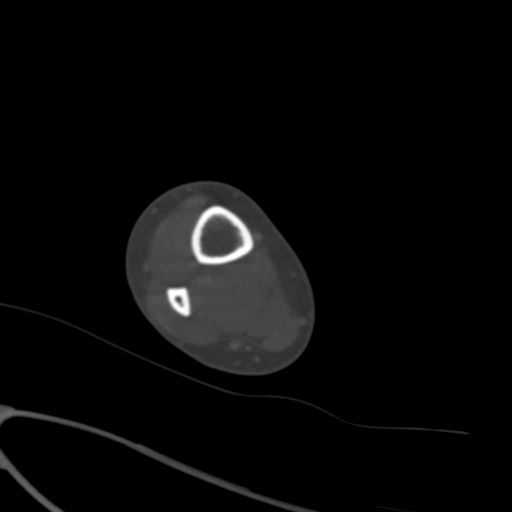

[Series 9: lower ext cor st · sagittal · 0.37mm/px · 5 of 85 slices shown, 6 images]
[im 29/85  bone]
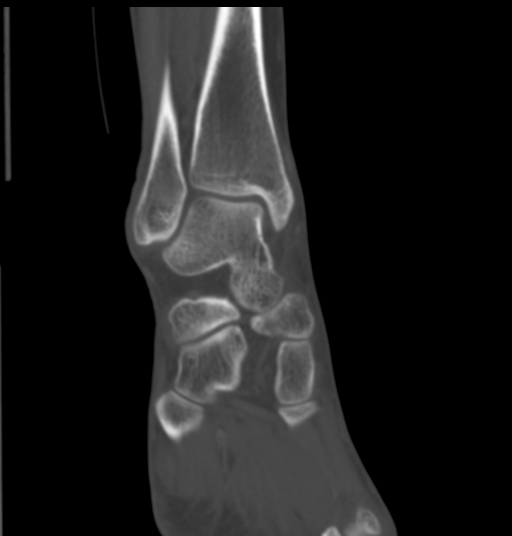
[im 36/85  bone]
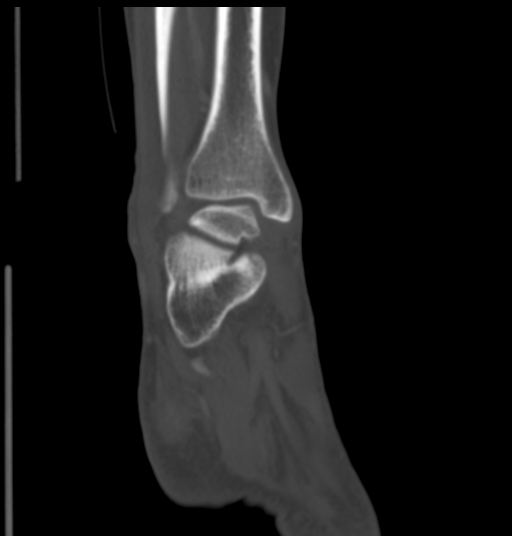
[im 43/85  soft-tissue]
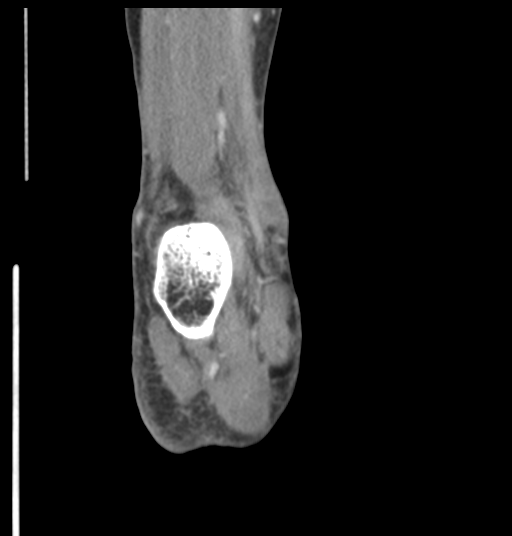
[im 43/85  bone]
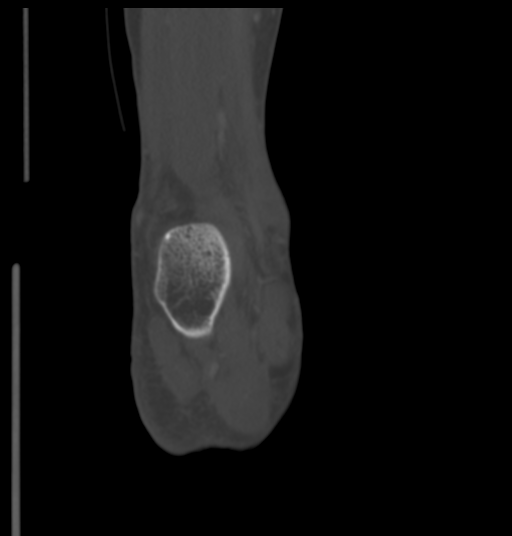
[im 50/85  bone]
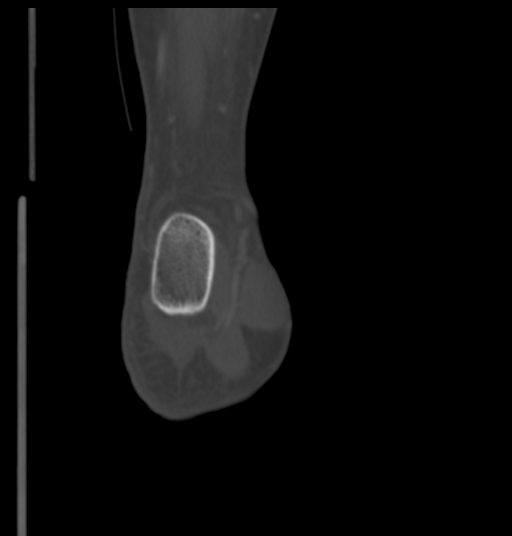
[im 57/85  bone]
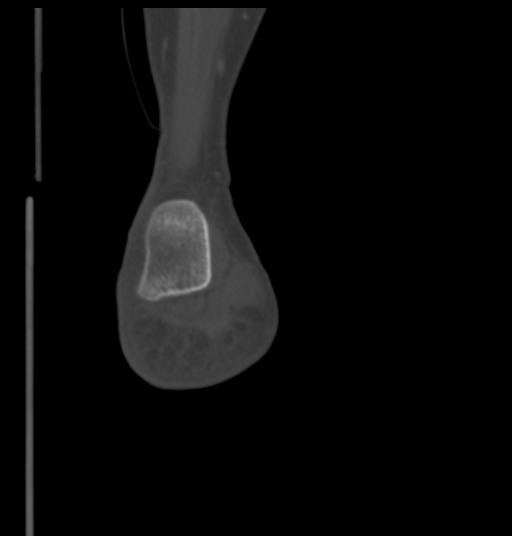

[15 of 27 positions shown; findings below may reference images not displayed]

FINDINGS: Bones/Joint/Cartilage

No fracture or dislocation. No areas of cortical destruction or
periosteal reaction are seen. The articular surfaces appear
maintained. No large ankle joint effusion.

Ligaments

Suboptimally assessed by CT.

Muscles and Tendons

The muscles surrounding the ankle and foot appear to be intact
without focal atrophy or tear. The flexor and extensor tendons are
intact. The Achilles tendon is intact.

Soft tissues: There is subcutaneous edema seen along the posterior
heel pad with mild skin thickening. There also appears to be mildly
heterogeneous density with thickening in the middle cord of the
plantar fascia, however it is intact. No loculated fluid collections
are noted.
IMPRESSION: Findings which could be suggestive of diffuse cellulitis along the
heel pad. No loculated fluid collections or definite evidence of
osteomyelitis.

## 2019-11-04 MED ORDER — SODIUM CHLORIDE 0.9 % IV SOLN
2.0000 g | Freq: Three times a day (TID) | INTRAVENOUS | Status: DC
  Administered 2019-11-04: 2 g via INTRAVENOUS
  Filled 2019-11-04: qty 2

## 2019-11-04 MED ORDER — KETOROLAC TROMETHAMINE 15 MG/ML IJ SOLN
15.0000 mg | Freq: Four times a day (QID) | INTRAMUSCULAR | Status: DC | PRN
  Administered 2019-11-04 – 2019-11-05 (×2): 15 mg via INTRAVENOUS
  Filled 2019-11-04 (×2): qty 1

## 2019-11-04 MED ORDER — BUPRENORPHINE HCL-NALOXONE HCL 8-2 MG SL SUBL
1.0000 | SUBLINGUAL_TABLET | Freq: Two times a day (BID) | SUBLINGUAL | Status: DC
  Filled 2019-11-04: qty 1

## 2019-11-04 MED ORDER — BUPRENORPHINE HCL-NALOXONE HCL 2-0.5 MG SL SUBL
2.0000 | SUBLINGUAL_TABLET | SUBLINGUAL | Status: AC | PRN
  Administered 2019-11-04: 2 via SUBLINGUAL
  Filled 2019-11-04: qty 2

## 2019-11-04 MED ORDER — VANCOMYCIN HCL 750 MG/150ML IV SOLN
750.0000 mg | Freq: Two times a day (BID) | INTRAVENOUS | Status: DC
  Administered 2019-11-04 – 2019-11-05 (×2): 750 mg via INTRAVENOUS
  Filled 2019-11-04 (×4): qty 150

## 2019-11-04 MED ORDER — IOHEXOL 300 MG/ML  SOLN
100.0000 mL | Freq: Once | INTRAMUSCULAR | Status: AC | PRN
  Administered 2019-11-04: 100 mL via INTRAVENOUS

## 2019-11-04 MED ORDER — METHOCARBAMOL 1000 MG/10ML IJ SOLN
500.0000 mg | Freq: Three times a day (TID) | INTRAVENOUS | Status: DC | PRN
  Administered 2019-11-04: 500 mg via INTRAVENOUS
  Filled 2019-11-04: qty 5
  Filled 2019-11-04: qty 500

## 2019-11-04 MED ORDER — KETOROLAC TROMETHAMINE 30 MG/ML IJ SOLN
15.0000 mg | Freq: Once | INTRAMUSCULAR | Status: AC
  Administered 2019-11-04: 15 mg via INTRAVENOUS
  Filled 2019-11-04: qty 1

## 2019-11-04 NOTE — Progress Notes (Signed)
Pt alert and aware in bed. She states she is a little 'worn' She gave a little of what is going on with her. She mother was at the bedside. I asked what I could do? They asked for prayer. The chaplain offered caring and supportive presence. I sang [employer] and offered  prayers and blessings. The pt spoke about being transfer to [facility], if so I asked if I could follow-up there. They said yes.

## 2019-11-04 NOTE — Progress Notes (Signed)
Patient was seen for red MEWS (d/t temp 38.1, RR 28, HR 109).   Chart reviewed, patient examined, and case discussed with RN at bedside. Patient alert, not in acute distress. RN is administering APAP and hydroxyzine now, will continue IVF, IV antibiotics, and close monitoring in progressive unit.

## 2019-11-04 NOTE — Progress Notes (Signed)
°  Echocardiogram 2D Echocardiogram has been performed.  Tye Savoy 11/04/2019, 12:00 PM

## 2019-11-04 NOTE — Progress Notes (Signed)
Pharmacy Antibiotic Note  Amber [name] is a 29 y.o. female admitted on 11/03/2019 with sepsis.  Pharmacy has been consulted for vancomycin + cefepime dosing.  Pt is a 40 yoF with PMH significant for IVDU, presenting with fatigue, weakness. BCID now + for MRSA bacteremia.  Today, 11/04/19  WBC WNL  SCr 0.66, CrCl ~75 mL/min  PCT 12  Tmax 102 F  TBW < IBW, use TBW to dose antibiotics  Today is day #2 of IV antibiotics  Plan:  Increase cefepime to 2 g IV q8h  Increase vancomycin to 750 mg IV q12h for goal VT 15-20 mcg/mL and/or AUC 400-550  Follow renal function and culture data (urine and blood in process)  Follow for ABX DOT  Height: 5\' 2"  (157.5 cm) Weight: 47.6 kg (105 lb) IBW/kg (Calculated) : 50.1  Temp (24hrs), Avg:98.6 F (37 C), Min:97.7 F (36.5 C), Max:101.1 F (38.4 C)  Recent Labs  Lab 11/03/19 0949 11/03/19 0950 11/03/19 1229 11/03/19 2134 11/03/19 2240 11/04/19 0434  WBC  --  10.7*  --   --   --  10.3  CREATININE  --  1.33*  1.33*  --   --  0.77 0.66  LATICACIDVEN 2.9*  --  1.7 1.6 1.7  --     Estimated Creatinine Clearance: 78 mL/min (by C-G formula based on SCr of 0.66 mg/dL).    No Known Allergies  Antimicrobials this admission: vancomycin 10/21 >>  cefepime 10/21 >>   Dose adjustments this admission:  Microbiology results: 10/21 BCx: 3/4 staph aureus, mecA detected.  10/21 UCx: >100K E.coli    Thank you for allowing pharmacy to be a part of this patient's care.  11/21, PharmD 11/04/2019 10:29 AM

## 2019-11-04 NOTE — Progress Notes (Signed)
PHARMACY - PHYSICIAN COMMUNICATION CRITICAL VALUE ALERT - BLOOD CULTURE IDENTIFICATION (BCID)  Amber [name] is an 29 y.o. female who presented to [facility] on 11/03/2019 with a chief complaint of head pain   Name of physician (or Provider) Contacted: Toniann Fail  Current antibiotics: vancomycin and cefepime  Changes to prescribed antibiotics recommended:  none  No results found for this or any previous visit.  Maurice March 11/04/2019  12:05 AM

## 2019-11-04 NOTE — Progress Notes (Signed)
F/U call to Pharmacy to send Robaxin. SRP, RN

## 2019-11-04 NOTE — Progress Notes (Signed)
Report called to EUN, RN at [facility] 18C. Patient mother at bedside updated. SRP, RN

## 2019-11-04 NOTE — Consult Note (Signed)
[facility]    Date of Admission:  11/03/2019     Reason for Consult: mrsa sepsis, ivdu    Referring Provider: Lowell Guitar     Lines:  Peripheral iv  Abx: 10/21-c vanc 10/21-c cefepime        Assessment: 29 y.o. female with medical history significant of IVDA, distant hx rle (knee/tib-fib) orif, admitted 10/21 with sepsis in setting of 3 weeks moderate headache and several days generalized weakness/fatigue, LE weakness/pain, found to have mrsa bacteremia  Chest ct c/w septic pulm emboli with LUL cavitary nodules  This is likely all mrsa disseminated process in the setting of ivdu  Other focal pain being investigated with imaging (bilateral foot). She has right tibial/fibular hardware and medial malleolus erythema which could suggest tibial nail infection. Again imaging is pending  She has lower back pain and couldn't walk but exam suggestive tenderness limiting factor. Mri pending thoracolumbar area  High risk endocarditis, but given focal spine pain/tenderness, duration of tx likely will cover for endocarditis course. Pending tte  Plan: 1. Stop cefepime 2. Continue vancomycin per pharmacy protocol 3. Repeat bcx tomorrow and daily thereafter until 3 days persistently negative 4. F/u mri spine and imaging bilateral ankles 5. F/u tte 6. Can defer tee for now as given back pain will need likely to treat 6-8 weeks duration, unless persistent bacteremia that needs w/u for source control   Active Problems:   Sepsis (HCC)   Scheduled Meds: . [START ON 11/05/2019] buprenorphine-naloxone  1 tablet Sublingual BID  . enoxaparin (LOVENOX) injection  40 mg Subcutaneous Q24H  . mouth rinse  15 mL Mouth Rinse BID   Continuous Infusions: . sodium chloride 125 mL/hr at 11/04/19 0531  . ceFEPime (MAXIPIME) IV 2 g (11/04/19 1034)  . methocarbamol (ROBAXIN) IV    . vancomycin     PRN Meds:.acetaminophen **OR** acetaminophen,  buprenorphine-naloxone, hydrOXYzine, loperamide, methocarbamol (ROBAXIN) IV, ondansetron  HPI: Amber [name] is a 29 y.o. female with medical history significant of IVDA, admitted 10/21 with sepsis in setting of 3 weeks moderate headache and several days generalized weakness/fatigue, found to have mrsa bacteremia  She reports headache 3 weeks ago but doesn't remember specific in terms of quality/location/exacerbating factors. Tried to help the head pain w/ APAP and heroin without much fruit. The last several days started having progressive generalized weakness and bilateral foot pain and unable to bear weight/walk. No uti sx. No urinary/bowel incontinence. Endorsed associated dry cough and decreased appetite  Last use heroin the day of admission  Hospital course:  10/21 admission fever 102; hds Wbc 10 Cr 0.8 lft not checked bcx returned within 24 hours mrsa by bcid ucx >100k ecoli (no uti sx) uds positive for cocaine and marijuanna Ct chest angio -- no PE but concerning for septic pulm emboli Started on vanc/cefepime    Review of Systems: ROS Negative 11 point ros unless mentioned above  Past Medical History:  Diagnosis Date  . Heroin abuse (HCC)   . Kidney stones   . MRSA (methicillin resistant staph aureus) culture positive   . Ovarian cyst   . UTI (lower urinary tract infection)     Social History   Tobacco Use  . Smoking status: Former Smoker    Types: Cigarettes  . Smokeless tobacco: Never Used  Vaping Use  . Vaping Use: Never used  Substance Use Topics  . Alcohol use: No  . Drug use: Yes  Types: Marijuana, IV    Comment: opiods- pain medication/heroin    No family history on file. No Known Allergies  OBJECTIVE: Blood pressure (!) 95/58, pulse (!) 105, temperature 98.3 F (36.8 C), resp. rate (!) 30, height 5\' 2"  (1.575 m), weight 47.6 kg, SpO2 97 %.  Physical Exam Drowsy, thin, no distress. Some verbal and yes/no question. Mother at  bedside Normocephalic; per; conj clear; oropharynx clear Neck supple cv rrr no mrg Lungs clear normal respiratory effort abd soft nontender msk bilateral foot tenderness; right medial malleolus erythema no fluctuance; able to do active rom bilateral ankle joint. Tender back lumbar area. Tender back and rle on proximal flexion. Negative log roll test Neuro cn2-12 intact; strength LE limited by pain/tenderness of foot and rle Psych drowsy, but cooperative/appropriate Skin: no rash. Mid upper chest jewelry implantation on chest.  Lab Results Lab Results  Component Value Date   WBC 10.3 11/04/2019   HGB 9.3 (L) 11/04/2019   HCT 27.9 (L) 11/04/2019   MCV 80.2 11/04/2019   PLT 193 11/04/2019    Lab Results  Component Value Date   CREATININE 0.66 11/04/2019   BUN 22 (H) 11/04/2019   NA 129 (L) 11/04/2019   K 3.5 11/04/2019   CL 96 (L) 11/04/2019   CO2 24 11/04/2019    Lab Results  Component Value Date   ALT 36 11/03/2019   AST 84 (H) 11/03/2019   ALKPHOS 133 (H) 11/03/2019   BILITOT 1.0 11/03/2019     Microbiology: Recent Results (from the past 240 hour(s))  Urine culture     Status: Abnormal (Preliminary result)   Collection Time: 11/03/19  8:18 AM   Specimen: In/Out Cath Urine  Result Value Ref Range Status   Specimen Description   Final    IN/OUT CATH URINE Performed at [facility], [address] [place], [place]    Special Requests   Final    NONE Performed at [facility], [address] [place], [place]    Culture (A)  Final    >=100,000 COLONIES/mL ESCHERICHIA COLI SUSCEPTIBILITIES TO FOLLOW Performed at [facility], [address], [place], [place]    Report Status PENDING  Incomplete  Respiratory Panel by RT PCR (Flu A&B, Covid) - Nasopharyngeal Swab     Status: None   Collection Time: 11/03/19  9:13 AM   Specimen: Nasopharyngeal Swab  Result Value Ref Range Status   SARS  Coronavirus 2 by RT PCR NEGATIVE NEGATIVE Final    Comment: (NOTE) SARS-CoV-2 target nucleic acids are NOT DETECTED.  The SARS-CoV-2 RNA is generally detectable in upper respiratoy specimens during the acute phase of infection. The lowest concentration of SARS-CoV-2 viral copies this assay can detect is 131 copies/mL. A negative result does not preclude SARS-Cov-2 infection and should not be used as the sole basis for treatment or other patient management decisions. A negative result may occur with  improper specimen collection/handling, submission of specimen other than nasopharyngeal swab, presence of viral mutation(s) within the areas targeted by this assay, and inadequate number of viral copies (<131 copies/mL). A negative result must be combined with clinical observations, patient history, and epidemiological information. The expected result is Negative.  Fact Sheet for Patients:  11/05/19  Fact Sheet for Healthcare Providers:  https://www.moore.com/  This test is no t yet approved or cleared by the https://www.young.biz/ FDA and  has been authorized for detection and/or diagnosis of SARS-CoV-2 by FDA  under an Emergency Use Authorization (EUA). This EUA will remain  in effect (meaning this test can be used) for the duration of the COVID-19 declaration under Section 564(b)(1) of the Act, 21 U.S.C. section 360bbb-3(b)(1), unless the authorization is terminated or revoked sooner.     Influenza A by PCR NEGATIVE NEGATIVE Final   Influenza B by PCR NEGATIVE NEGATIVE Final    Comment: (NOTE) The Xpert Xpress SARS-CoV-2/FLU/RSV assay is intended as an aid in  the diagnosis of influenza from Nasopharyngeal swab specimens and  should not be used as a sole basis for treatment. Nasal washings and  aspirates are unacceptable for Xpert Xpress SARS-CoV-2/FLU/RSV  testing.  Fact Sheet for  Patients: https://www.moore.com/  Fact Sheet for Healthcare Providers: https://www.young.biz/  This test is not yet approved or cleared by the [place] FDA and  has been authorized for detection and/or diagnosis of SARS-CoV-2 by  FDA under an Emergency Use Authorization (EUA). This EUA will remain  in effect (meaning this test can be used) for the duration of the  Covid-19 declaration under Section 564(b)(1) of the Act, 21  U.S.C. section 360bbb-3(b)(1), unless the authorization is  terminated or revoked. Performed at [facility], [address] [place], [place] [address]   Blood Culture (routine x 2)     Status: Abnormal (Preliminary result)   Collection Time: 11/03/19  9:50 AM   Specimen: BLOOD RIGHT HAND  Result Value Ref Range Status   Specimen Description   Final    BLOOD RIGHT HAND Performed at [facility], [address] [place], [place] [address]    Special Requests   Final    BOTTLES DRAWN AEROBIC ONLY Blood Culture adequate volume Performed at [facility], [address], [place], [place] [address]    Culture  Setup Time   Final    AEROBIC BOTTLE ONLY GRAM POSITIVE COCCI CRITICAL VALUE NOTED.  VALUE IS CONSISTENT WITH PREVIOUSLY REPORTED AND CALLED VALUE. Performed at [facility], [address] [place], [place] [address]    Culture STAPHYLOCOCCUS AUREUS (A)  Final   Report Status PENDING  Incomplete  Blood Culture (routine x 2)     Status: Abnormal (Preliminary result)   Collection Time: 11/03/19  9:50 AM   Specimen: BLOOD  Result Value Ref Range Status   Specimen Description   Final    BLOOD LEFT ARM Performed at [facility], [address], [place], [place] [address]    Special Requests   Final    BOTTLES DRAWN AEROBIC AND ANAEROBIC Blood Culture adequate volume Performed at [facility], [address],  [place], [place] [address]    Culture  Setup Time   Final    IN BOTH AEROBIC AND ANAEROBIC BOTTLES GRAM POSITIVE COCCI CRITICAL VALUE NOTED.  VALUE IS CONSISTENT WITH PREVIOUSLY REPORTED AND CALLED VALUE. Performed at [facility], [address] [place], [place] [address]    Culture STAPHYLOCOCCUS AUREUS (A)  Final   Report Status PENDING  Incomplete  Culture, blood (Routine X 2) w Reflex to ID Panel     Status: Abnormal (Preliminary result)   Collection Time: 11/03/19  9:52 AM   Specimen: BLOOD  Result Value Ref Range Status   Specimen Description   Final    BLOOD RIGHT ARM Performed at [facility], [address], [place], [place] [address]    Special Requests   Final    BOTTLES  DRAWN AEROBIC ONLY Blood Culture adequate volume Performed at [facility], [address], [place], [place] [address]    Culture  Setup Time   Final    AEROBIC BOTTLE ONLY GRAM POSITIVE COCCI Organism ID to follow CRITICAL RESULT CALLED TO, READ BACK BY AND VERIFIED WITH:  PHARMD E JACKSON 11/03/19 AT 1157 SK    Culture (A)  Final    STAPHYLOCOCCUS AUREUS SUSCEPTIBILITIES TO FOLLOW Performed at [facility], [address], [place], [place] [address]    Report Status PENDING  Incomplete  Blood Culture ID Panel (Reflexed)     Status: Abnormal   Collection Time: 11/03/19  9:52 AM  Result Value Ref Range Status   Enterococcus faecalis NOT DETECTED NOT DETECTED Final   Enterococcus Faecium NOT DETECTED NOT DETECTED Final   Listeria monocytogenes NOT DETECTED NOT DETECTED Final   Staphylococcus species DETECTED (A) NOT DETECTED Final    Comment: CRITICAL RESULT CALLED TO, READ BACK BY AND VERIFIED WITH: PHARMD E JACKSON 11/03/19 AT 2357 SK    Staphylococcus aureus (BCID) DETECTED (A) NOT DETECTED Final    Comment: Methicillin (oxacillin)-resistant Staphylococcus aureus (MRSA). MRSA is predictably resistant to beta-lactam antibiotics (except ceftaroline).  Preferred therapy is vancomycin unless clinically contraindicated. Patient requires contact precautions if  hospitalized. CRITICAL RESULT CALLED TO, READ BACK BY AND VERIFIED WITH: PHARMD E JACKSON 11/03/19 AT 2357 SK    Staphylococcus epidermidis NOT DETECTED NOT DETECTED Final   Staphylococcus lugdunensis NOT DETECTED NOT DETECTED Final   Streptococcus species NOT DETECTED NOT DETECTED Final   Streptococcus agalactiae NOT DETECTED NOT DETECTED Final   Streptococcus pneumoniae NOT DETECTED NOT DETECTED Final   Streptococcus pyogenes NOT DETECTED NOT DETECTED Final   A.calcoaceticus-baumannii NOT DETECTED NOT DETECTED Final   Bacteroides fragilis NOT DETECTED NOT DETECTED Final   Enterobacterales NOT DETECTED NOT DETECTED Final   Enterobacter cloacae complex NOT DETECTED NOT DETECTED Final   Escherichia coli NOT DETECTED NOT DETECTED Final   Klebsiella aerogenes NOT DETECTED NOT DETECTED Final   Klebsiella oxytoca NOT DETECTED NOT DETECTED Final   Klebsiella pneumoniae NOT DETECTED NOT DETECTED Final   Proteus species NOT DETECTED NOT DETECTED Final   Salmonella species NOT DETECTED NOT DETECTED Final   Serratia marcescens NOT DETECTED NOT DETECTED Final   Haemophilus influenzae NOT DETECTED NOT DETECTED Final   Neisseria meningitidis NOT DETECTED NOT DETECTED Final   Pseudomonas aeruginosa NOT DETECTED NOT DETECTED Final   Stenotrophomonas maltophilia NOT DETECTED NOT DETECTED Final   Candida albicans NOT DETECTED NOT DETECTED Final   Candida auris NOT DETECTED NOT DETECTED Final   Candida glabrata NOT DETECTED NOT DETECTED Final   Candida krusei NOT DETECTED NOT DETECTED Final   Candida parapsilosis NOT DETECTED NOT DETECTED Final   Candida tropicalis NOT DETECTED NOT DETECTED Final   Cryptococcus neoformans/gattii NOT DETECTED NOT DETECTED Final   Meth resistant mecA/C and MREJ DETECTED (A) NOT DETECTED Final    Comment: CRITICAL RESULT CALLED TO, READ BACK BY AND VERIFIED  WITH: PHARMD E JACKSON 11/03/19 AT 2357 SK Performed at [facility], [address], [place], [place] [address]    Imaging: 10/21 chest cta FINDINGS: Cardiovascular: Satisfactory opacification of the pulmonary arteries to the segmental level. No evidence of pulmonary embolism. Normal heart size. No pericardial effusion. Thoracic aorta is normal in caliber.  Mediastinum/Nodes: Mildly enlarged mediastinal and hilar lymph nodes are likely reactive. Thyroid and esophagus are unremarkable.  Lungs/Pleura: Numerous nodules are present bilaterally  with some demonstrating internal cavitation. Mild interlobular septal thickening. Mild bibasilar atelectasis. No pleural effusion or pneumothorax.  Upper Abdomen: No acute abnormality.  Musculoskeletal: Osseous structures are unremarkable.  Review of the MIP images confirms the above findings.  IMPRESSION: No acute pulmonary embolism.  Pulmonary findings most consistent with septic emboli. Minor interstitial pulmonary edema.  Raymondo Bandrung T Taela Charbonneau, MD [facility] [phone] pager    11/04/2019, 11:15 AM

## 2019-11-04 NOTE — Progress Notes (Signed)
Patient again verbalized that it was okay to give information regarding her progress and plan of care. Mother called for an update and updated her via telephone.

## 2019-11-04 NOTE — Progress Notes (Signed)
Failed attempt at MRI. Patient terminated the procedure and refused to continue. Patient stated " I just cant do anymore". MRI of the thoracic and lumbar spine without gadolinium completed.

## 2019-11-04 NOTE — Progress Notes (Signed)
Pt transport to [facility] via [facility]. SRP, RN

## 2019-11-04 NOTE — Progress Notes (Signed)
[facility] called to dispatch for transport. SRP, RN

## 2019-11-04 NOTE — Progress Notes (Addendum)
PROGRESS NOTE    Amber [name]  IRW:[id number] DOB: 1990-04-07 DOA: 11/03/2019 PCP: Patient, No Pcp Per   Chief Complaint  Patient presents with  . Fever  . Weakness   Brief Narrative:  Amber [name] is Tahmir [name] 29 y.o. female with medical history significant of IVDA. Presenting with fatigue and weakness. 3 weeks ago she started having head pain. She is unable to tell me exactly where it was. Of note, hard to keep her on task during the interview d/t drowsiness. She tried to help the head pain w/ APAP and heroin. These didn't help. So the pain persisted in the background. She states that over the past several days, she has noticed that she has become increasingly weak. She has no energy to do normal daily tasks. She has notice dry cough and pain associated with cough. She has noticed decreased appetite. She became concerned and decided to come to the ED. She denies any other aggravating or alleviating factors. She denies any other treatments.    ED Course: She was found to be hyponatremic and to have an elevated Scr. It was noted that she was febrile, tachy and had Ebin Palazzi cough. CXR was notable for atypical infection. TRH was called for admission.   Review of Systems: Reports pleuritic pain. No N/V/D, ab pain.  Remainder of 10 point review of systems is otherwise negative for all not mentioned in HPI.   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  T/L spine imaging pending given back pain as well as an MRI brain due to headaches.  Echo was done earlier today and verbal report from cards noted likely small TV vegetation and possible RV mass.  She's being transferred to [facility] for cardiac MRI and subspecialty care.  ID following.  Assessment & Plan:   Active Problems:   Sepsis (HCC)   MRSA bacteremia  Addendum: per cardiology, likely small TV vegetation seen on TTE, also noted possible RV mass.  Recommended cardiac MRI.  [facility] for [facility].  Will  place transfer orders to [facility] and stat cardiac mri.   Addendum: cardiac MRI unable to be performed.  Discussed with cardiology who recommended discussion with CT surgery.  CT surgery consulted, discussed with Dr. Laneta Simmers who will evaluate.  Cards/CT recommended consider anticoagulation, but currently awaiting MRI brain for concern for septic emboli with HA.  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  Cefepime d/c'd per ID. ID c/s, appreciate assistance APAP, toradol, robaxin prn pain.  Hold off on opiates for now, will try to start suboxone today.    Polysubstance Abuse  Opiate Use Disorder Plan for suboxone initiation today when in mild to moderate withdrawal Social work c/s, appreciate assistance  HIV negative, follow acute hepatitis panel  Elevated INR: follow hepatic function panel   AKI: resolved with IVF  Hyponatremia:  Improved, likely component volume with improvement with IVF.  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   Mild, continue to monitor AM cortisol was 14.9 making adrenal insufficiency less likely, TSH wnl  DVT prophylaxis: lovenox Code Status: full Family Communication: mother  at bedside Disposition:   Status is: Inpatient  Remains inpatient appropriate because:Inpatient level of care appropriate due to severity of illness   Dispo: The patient is from: Home              Anticipated d/c is to: pending              Anticipated d/c date is: > 3 days              Patient currently is not medically stable to d/c.  Consultants:   ID  Procedures:    none  Antimicrobials: ( Anti-infectives (From admission, onward)   Start     Dose/Rate Route Frequency Ordered Stop   11/04/19 2200  vancomycin (VANCOREADY) IVPB 750 mg/150 mL        750 mg 150 mL/hr over 60 Minutes Intravenous Every 12 hours 11/04/19 1028     11/04/19 1100  ceFEPIme (MAXIPIME) 2 g in sodium chloride 0.9 % 100 mL IVPB  Status:  Discontinued        2 g 200 mL/hr over 30 Minutes Intravenous Every 8 hours 11/04/19 1022 11/04/19 1129   11/04/19 0900  vancomycin (VANCOCIN) IVPB 1000 mg/200 mL premix  Status:  Discontinued        1,000 mg 200 mL/hr over 60 Minutes Intravenous Every 24 hours 11/03/19 1424 11/04/19 1028   11/03/19 2200  ceFEPIme (MAXIPIME) 2 g in sodium chloride 0.9 % 100 mL IVPB  Status:  Discontinued        2 g 200 mL/hr over 30 Minutes Intravenous Every 12 hours 11/03/19 1424 11/04/19 1022   11/03/19 0830  ceFEPIme (MAXIPIME) 2 g in sodium chloride 0.9 % 100 mL IVPB        2 g 200 mL/hr over 30 Minutes Intravenous  Once 11/03/19 0818 11/03/19 1120   11/03/19 0830  vancomycin (VANCOCIN) IVPB 1000 mg/200 mL premix        1,000 mg 200 mL/hr over 60 Minutes Intravenous  Once 11/03/19 0818 11/03/19 1120     Subjective: C/o left sided lower back pain C/o weakness, inability to walk since Wed No saddle anesthesia, incontinence Agreeable to suboxone  Objective: Vitals:   11/04/19 0126 11/04/19 0531 11/04/19 0928 11/04/19 1306  BP: (!) 101/55 112/64 (!) 95/58 100/61  Pulse: (!) 106 (!) 107 (!) 105 96  Resp: (!) 26 (!) 24 (!) 30 (!) 28  Temp: 98.7 F (37.1 C) 98.4 F (36.9 C) 98.3 F (36.8 C) 97.9 F (36.6 C)  TempSrc: Oral Oral  Oral  SpO2: 100% 100% 97% 99%  Weight:      Height:        Intake/Output Summary (Last 24 hours) at 11/04/2019 1332 Last data filed at 11/04/2019 0340 Gross per 24 hour  Intake 1800 ml  Output --  Net 1800 ml   Filed Weights   11/03/19 0813 11/03/19 0911  Weight: 47.6 kg 47.6 kg    Examination:  General  exam: Appears calm and comfortable  Respiratory system: Clear to auscultation. Respiratory effort normal. Cardiovascular system: S1 & S2 heard, RRR. Gastrointestinal system: Abdomen is nondistended, soft and nontender.  Central nervous system: Alert and oriented. No focal neurological deficits. Extremities: no LEE, erythema to medial epicondyle of R ankle, TTP to L foot She's unable to roll to her side so I can examine her back due to pain Skin: No rashes, lesions or ulcers Psychiatry: Judgement and insight appear normal. Mood & affect appropriate.     Data  Reviewed: I have personally reviewed following labs and imaging studies  CBC: Recent Labs  Lab 11/03/19 0950 11/04/19 0434  WBC 10.7* 10.3  NEUTROABS 9.3*  --   HGB 12.0 9.3*  HCT 35.6* 27.9*  MCV 79.3* 80.2  PLT 173 193    Basic Metabolic Panel: Recent Labs  Lab 11/03/19 0950 11/03/19 2240 11/04/19 0434  NA 128*  123* 131* 129*  K 4.2  4.1 3.8 3.5  CL 89*  86* 95* 96*  CO2 22  22 25 24   GLUCOSE 88  92 105* 134*  BUN 39*  39* 30* 22*  CREATININE 1.33*  1.33* 0.77 0.66  CALCIUM 8.2*  8.0* 7.9* 7.5*  PHOS 4.2 3.6 2.6    GFR: Estimated Creatinine Clearance: 78 mL/min (by C-G formula based on SCr of 0.66 mg/dL).  Liver Function Tests: Recent Labs  Lab 11/03/19 0950 11/03/19 2240 11/04/19 0434  AST 84*  --   --   ALT 36  --   --   ALKPHOS 133*  --   --   BILITOT 1.0  --   --   PROT 7.5  --   --   ALBUMIN 2.2*  2.2* 1.8* 1.5*    CBG: No results for input(s): GLUCAP in the last 168 hours.   Recent Results (from the past 240 hour(s))  Urine culture     Status: Abnormal (Preliminary result)   Collection Time: 11/03/19  8:18 AM   Specimen: In/Out Cath Urine  Result Value Ref Range Status   Specimen Description   Final    IN/OUT CATH URINE Performed at [facility], [address] [place], [place] [address]    Special Requests   Final    NONE Performed at [facility], [address] [place], [place] [address]    Culture (Bodey Frizell)  Final    >=100,000 COLONIES/mL ESCHERICHIA COLI SUSCEPTIBILITIES TO FOLLOW Performed at [facility], [address] [place], [place] [address]    Report Status PENDING  Incomplete  Respiratory Panel by RT PCR (Flu Demonte Dobratz&B, Covid) - Nasopharyngeal Swab     Status: None   Collection Time: 11/03/19  9:13 AM   Specimen: Nasopharyngeal Swab  Result Value Ref Range Status   SARS Coronavirus 2 by RT PCR NEGATIVE NEGATIVE Final    Comment: (NOTE) SARS-CoV-2 target nucleic acids are NOT DETECTED.  The SARS-CoV-2 RNA is generally detectable in upper respiratoy specimens during the acute phase of infection. The lowest concentration of SARS-CoV-2 viral copies this assay can detect is 131 copies/mL. Shawntina Diffee negative result does not preclude SARS-Cov-2 infection and should not be used as the sole basis for treatment or other patient management decisions. Inmer Nix negative result may occur with  improper specimen collection/handling, submission of specimen other than nasopharyngeal swab, presence of viral mutation(s) within the areas targeted by this assay, and inadequate number of viral copies (<131 copies/mL). Drury Ardizzone negative result must be combined with clinical observations, patient history, and epidemiological information. The expected result is Negative.  Fact Sheet for Patients:  https://www.moore.com/https://www.fda.gov/media/142436/download  Fact Sheet for Healthcare Providers:  https://www.young.biz/https://www.fda.gov/media/142435/download  This test is no t yet approved or cleared by the [place] FDA and  has been authorized for detection and/or diagnosis of SARS-CoV-2 by FDA under an Emergency Use Authorization (EUA). This EUA will remain  in effect (meaning this test can be used) for the duration of the COVID-19 declaration under Section 564(b)(1) of the Act, 21 U.S.C. section 360bbb-3(b)(1),  unless the authorization is terminated or revoked  sooner.     Influenza Davied Nocito by PCR NEGATIVE NEGATIVE Final   Influenza B by PCR NEGATIVE NEGATIVE Final    Comment: (NOTE) The Xpert Xpress SARS-CoV-2/FLU/RSV assay is intended as an aid in  the diagnosis of influenza from Nasopharyngeal swab specimens and  should not be used as Taylan Mayhan sole basis for treatment. Nasal washings and  aspirates are unacceptable for Xpert Xpress SARS-CoV-2/FLU/RSV  testing.  Fact Sheet for Patients: https://www.moore.com/  Fact Sheet for Healthcare Providers: https://www.young.biz/  This test is not yet approved or cleared by the [place] FDA and  has been authorized for detection and/or diagnosis of SARS-CoV-2 by  FDA under an Emergency Use Authorization (EUA). This EUA will remain  in effect (meaning this test can be used) for the duration of the  Covid-19 declaration under Section 564(b)(1) of the Act, 21  U.S.C. section 360bbb-3(b)(1), unless the authorization is  terminated or revoked. Performed at [facility], [address] [place], [place] [address]   Blood Culture (routine x 2)     Status: Abnormal (Preliminary result)   Collection Time: 11/03/19  9:50 AM   Specimen: BLOOD RIGHT HAND  Result Value Ref Range Status   Specimen Description   Final    BLOOD RIGHT HAND Performed at [facility], [address] [place], [place] [address]    Special Requests   Final    BOTTLES DRAWN AEROBIC ONLY Blood Culture adequate volume Performed at [facility], [address], [place], [place] [address]    Culture  Setup Time   Final    AEROBIC BOTTLE ONLY GRAM POSITIVE COCCI CRITICAL VALUE NOTED.  VALUE IS CONSISTENT WITH PREVIOUSLY REPORTED AND CALLED VALUE. Performed at [facility], [address], [place], [place] [address]    Culture STAPHYLOCOCCUS AUREUS (Keilani Terrance)  Final   Report Status PENDING  Incomplete  Blood Culture (routine x 2)     Status:  Abnormal (Preliminary result)   Collection Time: 11/03/19  9:50 AM   Specimen: BLOOD  Result Value Ref Range Status   Specimen Description   Final    BLOOD LEFT ARM Performed at [facility], [address], [place], [place] [address]    Special Requests   Final    BOTTLES DRAWN AEROBIC AND ANAEROBIC Blood Culture adequate volume Performed at [facility], [address], [place], [place] [address]    Culture  Setup Time   Final    IN BOTH AEROBIC AND ANAEROBIC BOTTLES GRAM POSITIVE COCCI CRITICAL VALUE NOTED.  VALUE IS CONSISTENT WITH PREVIOUSLY REPORTED AND CALLED VALUE. Performed at [facility], [address], [place], [place] [address]    Culture STAPHYLOCOCCUS AUREUS (Kandon Hosking)  Final   Report Status PENDING  Incomplete  Culture, blood (Routine X 2) w Reflex to ID Panel     Status: Abnormal (Preliminary result)   Collection Time: 11/03/19  9:52 AM   Specimen: BLOOD  Result Value Ref Range Status   Specimen Description   Final    BLOOD RIGHT ARM Performed at [facility], [address] [place], [place] [address]    Special Requests   Final    BOTTLES DRAWN AEROBIC ONLY Blood Culture adequate volume Performed at [facility], [address], [place], [place] [address]    Culture  Setup Time   Final    AEROBIC BOTTLE ONLY  GRAM POSITIVE COCCI Organism ID to follow CRITICAL RESULT CALLED TO, READ BACK BY AND VERIFIED WITH:  PHARMD E JACKSON 11/03/19 AT 1157 SK    Culture (Ansleigh Safer)  Final    STAPHYLOCOCCUS AUREUS SUSCEPTIBILITIES TO FOLLOW Performed at [facility], [address], [place], [place] [address]    Report Status PENDING  Incomplete  Blood Culture ID Panel (Reflexed)     Status: Abnormal   Collection Time: 11/03/19  9:52 AM  Result Value Ref Range Status   Enterococcus faecalis NOT DETECTED NOT DETECTED Final   Enterococcus Faecium NOT DETECTED NOT DETECTED Final   Listeria  monocytogenes NOT DETECTED NOT DETECTED Final   Staphylococcus species DETECTED (Lamyiah Crawshaw) NOT DETECTED Final    Comment: CRITICAL RESULT CALLED TO, READ BACK BY AND VERIFIED WITH: PHARMD E JACKSON 11/03/19 AT 2357 SK    Staphylococcus aureus (BCID) DETECTED (Reighan Hipolito) NOT DETECTED Final    Comment: Methicillin (oxacillin)-resistant Staphylococcus aureus (MRSA). MRSA is predictably resistant to beta-lactam antibiotics (except ceftaroline). Preferred therapy is vancomycin unless clinically contraindicated. Patient requires contact precautions if  hospitalized. CRITICAL RESULT CALLED TO, READ BACK BY AND VERIFIED WITH: PHARMD E JACKSON 11/03/19 AT 2357 SK    Staphylococcus epidermidis NOT DETECTED NOT DETECTED Final   Staphylococcus lugdunensis NOT DETECTED NOT DETECTED Final   Streptococcus species NOT DETECTED NOT DETECTED Final   Streptococcus agalactiae NOT DETECTED NOT DETECTED Final   Streptococcus pneumoniae NOT DETECTED NOT DETECTED Final   Streptococcus pyogenes NOT DETECTED NOT DETECTED Final   Magali Bray.calcoaceticus-baumannii NOT DETECTED NOT DETECTED Final   Bacteroides fragilis NOT DETECTED NOT DETECTED Final   Enterobacterales NOT DETECTED NOT DETECTED Final   Enterobacter cloacae complex NOT DETECTED NOT DETECTED Final   Escherichia coli NOT DETECTED NOT DETECTED Final   Klebsiella aerogenes NOT DETECTED NOT DETECTED Final   Klebsiella oxytoca NOT DETECTED NOT DETECTED Final   Klebsiella pneumoniae NOT DETECTED NOT DETECTED Final   Proteus species NOT DETECTED NOT DETECTED Final   Salmonella species NOT DETECTED NOT DETECTED Final   Serratia marcescens NOT DETECTED NOT DETECTED Final   Haemophilus influenzae NOT DETECTED NOT DETECTED Final   Neisseria meningitidis NOT DETECTED NOT DETECTED Final   Pseudomonas aeruginosa NOT DETECTED NOT DETECTED Final   Stenotrophomonas maltophilia NOT DETECTED NOT DETECTED Final   Candida albicans NOT DETECTED NOT DETECTED Final   Candida auris NOT DETECTED  NOT DETECTED Final   Candida glabrata NOT DETECTED NOT DETECTED Final   Candida krusei NOT DETECTED NOT DETECTED Final   Candida parapsilosis NOT DETECTED NOT DETECTED Final   Candida tropicalis NOT DETECTED NOT DETECTED Final   Cryptococcus neoformans/gattii NOT DETECTED NOT DETECTED Final   Meth resistant mecA/C and MREJ DETECTED (Jacqulynn Shappell) NOT DETECTED Final    Comment: CRITICAL RESULT CALLED TO, READ BACK BY AND VERIFIED WITH: PHARMD E JACKSON 11/03/19 AT 2357 SK Performed at [facility], [address], [place], [place] [address]          Radiology Studies: CT Angio Chest PE W and/or Wo Contrast  Result Date: 11/03/2019 CLINICAL DATA:  Hypotensive, heroin user EXAM: CT ANGIOGRAPHY CHEST WITH CONTRAST TECHNIQUE: Multidetector CT imaging of the chest was performed using the standard protocol during bolus administration of intravenous contrast. Multiplanar CT image reconstructions and MIPs were obtained to evaluate the vascular anatomy. CONTRAST:  OMNIPAQUE IOHEXOL 350 MG/ML SOLN COMPARISON:  None. FINDINGS: Cardiovascular: Satisfactory opacification of the pulmonary arteries to the segmental level. No evidence of pulmonary embolism. Normal  heart size. No pericardial effusion. Thoracic aorta is normal in caliber. Mediastinum/Nodes: Mildly enlarged mediastinal and hilar lymph nodes are likely reactive. Thyroid and esophagus are unremarkable. Lungs/Pleura: Numerous nodules are present bilaterally with some demonstrating internal cavitation. Mild interlobular septal thickening. Mild bibasilar atelectasis. No pleural effusion or pneumothorax. Upper Abdomen: No acute abnormality. Musculoskeletal: Osseous structures are unremarkable. Review of the MIP images confirms the above findings. IMPRESSION: No acute pulmonary embolism. Pulmonary findings most consistent with septic emboli. Minor interstitial pulmonary edema. These results were called by telephone at the time of interpretation on  11/03/2019 at 11:56 am to provider ADAM CURATOLO , who verbally acknowledged these results. Electronically Signed   By: Guadlupe Spanish M.D.   On: 11/03/2019 12:01   DG Chest Port 1 View  Result Date: 11/03/2019 CLINICAL DATA:  Sepsis, IV drug use EXAM: PORTABLE CHEST 1 VIEW COMPARISON:  09/07/2014 FINDINGS: Since the prior examination, there have developed multiple peripherally distributed pulmonary nodules within the lungs bilaterally. Given the patient's history of drug use, septic embolization should be considered. Atypical infection, such as fungal pneumonia, or metastatic disease are considered less likely, but could present similarly. No confluent pulmonary infiltrates. Cardiac size within normal limits. Pulmonary vascularity is normal. No acute bone abnormality. IMPRESSION: Interval development of multiple pulmonary nodules. This could be better assessed with CT examination, if indicated. Electronically Signed   By: Helyn Numbers MD   On: 11/03/2019 08:58        Scheduled Meds: . Melene Muller ON 11/05/2019] buprenorphine-naloxone  1 tablet Sublingual BID  . enoxaparin (LOVENOX) injection  40 mg Subcutaneous Q24H  . mouth rinse  15 mL Mouth Rinse BID   Continuous Infusions: . sodium chloride 125 mL/hr at 11/04/19 0531  . methocarbamol (ROBAXIN) IV    . vancomycin       LOS: 1 day    Time spent: over 30 min    Lacretia Nicks, MD [facility]   To contact the attending provider between 7A-7P or the covering provider during after hours 7P-7A, please log into the web site [email or web address] and access using universal [place] password for that web site. If you do not have the password, please call the hospital operator.  11/04/2019, 1:32 PM

## 2019-11-04 NOTE — Progress Notes (Signed)
PT transferred from [facility] by EMS. Pt was complaining of generalized pain 7 out of 10 while we are transferring from stretcher to bed. Pain med was already given before she got transferred.Pt was alert and oriented x3. Chronic yellow Mews from [facility] due to Tachycardia and/or Respiration rate.   Vitals on admission Temp - 97.6 BP-99/73 HR- 103 Res- 18

## 2019-11-04 NOTE — Progress Notes (Signed)
Pt refused to continue with MRI. When we pulled pt out to give contrast, her IV was not working so we attemptd to start another and was unsuccessful, when we put the order in for the IV team to come down, she stated she cannot breathe and she is nauseous. She stated she would try to finish the post contrast part of the exam at another time.

## 2019-11-05 ENCOUNTER — Emergency Department (HOSPITAL_COMMUNITY): Payer: Self-pay

## 2019-11-05 ENCOUNTER — Encounter (HOSPITAL_COMMUNITY): Payer: Self-pay | Admitting: Internal Medicine

## 2019-11-05 ENCOUNTER — Inpatient Hospital Stay (HOSPITAL_COMMUNITY)
Admission: EM | Admit: 2019-11-05 | Discharge: 2019-11-07 | DRG: 871 | Discharge status: Against Medical Advice | Payer: Self-pay | Attending: Internal Medicine | Admitting: Internal Medicine

## 2019-11-05 ENCOUNTER — Encounter (HOSPITAL_COMMUNITY): Payer: Self-pay | Admitting: Emergency Medicine

## 2019-11-05 DIAGNOSIS — F1910 Other psychoactive substance abuse, uncomplicated: Secondary | ICD-10-CM

## 2019-11-05 DIAGNOSIS — E861 Hypovolemia: Secondary | ICD-10-CM | POA: Diagnosis present

## 2019-11-05 DIAGNOSIS — A4102 Sepsis due to Methicillin resistant Staphylococcus aureus: Principal | ICD-10-CM | POA: Diagnosis present

## 2019-11-05 DIAGNOSIS — I76 Septic arterial embolism: Secondary | ICD-10-CM

## 2019-11-05 DIAGNOSIS — L03116 Cellulitis of left lower limb: Secondary | ICD-10-CM | POA: Diagnosis present

## 2019-11-05 DIAGNOSIS — Z87442 Personal history of urinary calculi: Secondary | ICD-10-CM

## 2019-11-05 DIAGNOSIS — I071 Rheumatic tricuspid insufficiency: Secondary | ICD-10-CM | POA: Diagnosis present

## 2019-11-05 DIAGNOSIS — Z8614 Personal history of Methicillin resistant Staphylococcus aureus infection: Secondary | ICD-10-CM

## 2019-11-05 DIAGNOSIS — R7881 Bacteremia: Secondary | ICD-10-CM | POA: Diagnosis present

## 2019-11-05 DIAGNOSIS — I5189 Other ill-defined heart diseases: Secondary | ICD-10-CM

## 2019-11-05 DIAGNOSIS — Z8249 Family history of ischemic heart disease and other diseases of the circulatory system: Secondary | ICD-10-CM

## 2019-11-05 DIAGNOSIS — Z681 Body mass index (BMI) 19 or less, adult: Secondary | ICD-10-CM

## 2019-11-05 DIAGNOSIS — E870 Hyperosmolality and hypernatremia: Secondary | ICD-10-CM | POA: Diagnosis present

## 2019-11-05 DIAGNOSIS — E46 Unspecified protein-calorie malnutrition: Secondary | ICD-10-CM | POA: Diagnosis present

## 2019-11-05 DIAGNOSIS — I330 Acute and subacute infective endocarditis: Secondary | ICD-10-CM

## 2019-11-05 DIAGNOSIS — B9562 Methicillin resistant Staphylococcus aureus infection as the cause of diseases classified elsewhere: Secondary | ICD-10-CM

## 2019-11-05 DIAGNOSIS — B1920 Unspecified viral hepatitis C without hepatic coma: Secondary | ICD-10-CM | POA: Diagnosis present

## 2019-11-05 DIAGNOSIS — E871 Hypo-osmolality and hyponatremia: Secondary | ICD-10-CM | POA: Diagnosis present

## 2019-11-05 DIAGNOSIS — D649 Anemia, unspecified: Secondary | ICD-10-CM | POA: Diagnosis present

## 2019-11-05 DIAGNOSIS — Z87891 Personal history of nicotine dependence: Secondary | ICD-10-CM

## 2019-11-05 DIAGNOSIS — I2690 Septic pulmonary embolism without acute cor pulmonale: Secondary | ICD-10-CM | POA: Diagnosis present

## 2019-11-05 DIAGNOSIS — Z5329 Procedure and treatment not carried out because of patient's decision for other reasons: Secondary | ICD-10-CM | POA: Diagnosis present

## 2019-11-05 DIAGNOSIS — A419 Sepsis, unspecified organism: Secondary | ICD-10-CM | POA: Diagnosis present

## 2019-11-05 DIAGNOSIS — E876 Hypokalemia: Secondary | ICD-10-CM | POA: Diagnosis present

## 2019-11-05 DIAGNOSIS — N179 Acute kidney failure, unspecified: Secondary | ICD-10-CM

## 2019-11-05 LAB — CBC WITH DIFFERENTIAL/PLATELET
Abs Immature Granulocytes: 0.22 10*3/uL — ABNORMAL HIGH (ref 0.00–0.07)
Basophils Absolute: 0 10*3/uL (ref 0.0–0.1)
Basophils Relative: 0 %
Eosinophils Absolute: 0 10*3/uL (ref 0.0–0.5)
Eosinophils Relative: 0 %
HCT: 30.8 % — ABNORMAL LOW (ref 36.0–46.0)
Hemoglobin: 9.9 g/dL — ABNORMAL LOW (ref 12.0–15.0)
Immature Granulocytes: 2 %
Lymphocytes Relative: 8 %
Lymphs Abs: 1 10*3/uL (ref 0.7–4.0)
MCH: 26.1 pg (ref 26.0–34.0)
MCHC: 32.1 g/dL (ref 30.0–36.0)
MCV: 81.3 fL (ref 80.0–100.0)
Monocytes Absolute: 0.5 10*3/uL (ref 0.1–1.0)
Monocytes Relative: 4 %
Neutro Abs: 11 10*3/uL — ABNORMAL HIGH (ref 1.7–7.7)
Neutrophils Relative %: 86 %
Platelets: 346 10*3/uL (ref 150–400)
RBC: 3.79 MIL/uL — ABNORMAL LOW (ref 3.87–5.11)
RDW: 14.6 % (ref 11.5–15.5)
WBC: 12.7 10*3/uL — ABNORMAL HIGH (ref 4.0–10.5)
nRBC: 0 % (ref 0.0–0.2)

## 2019-11-05 LAB — LACTIC ACID, PLASMA
Lactic Acid, Venous: 2.8 mmol/L (ref 0.5–1.9)
Lactic Acid, Venous: 1.6 mmol/L (ref 0.5–1.9)

## 2019-11-05 LAB — COMPREHENSIVE METABOLIC PANEL
ALT: 22 U/L (ref 0–44)
AST: 32 U/L (ref 15–41)
Albumin: 1.8 g/dL — ABNORMAL LOW (ref 3.5–5.0)
Alkaline Phosphatase: 128 U/L — ABNORMAL HIGH (ref 38–126)
Anion gap: 12 (ref 5–15)
BUN: 8 mg/dL (ref 6–20)
CO2: 20 mmol/L — ABNORMAL LOW (ref 22–32)
Calcium: 8.3 mg/dL — ABNORMAL LOW (ref 8.9–10.3)
Chloride: 99 mmol/L (ref 98–111)
Creatinine, Ser: 0.66 mg/dL (ref 0.44–1.00)
GFR, Estimated: >60 mL/min (ref >60)
Glucose, Bld: 148 mg/dL — ABNORMAL HIGH (ref 70–99)
Potassium: 3.4 mmol/L — ABNORMAL LOW (ref 3.5–5.1)
Sodium: 131 mmol/L — ABNORMAL LOW (ref 135–145)
Total Bilirubin: 0.4 mg/dL (ref 0.3–1.2)
Total Protein: 6.9 g/dL (ref 6.5–8.1)

## 2019-11-05 LAB — CULTURE, BLOOD (ROUTINE X 2)
Special Requests: ADEQUATE
Special Requests: ADEQUATE
Special Requests: ADEQUATE

## 2019-11-05 LAB — URINE CULTURE: Culture: >=100000 — AB

## 2019-11-05 LAB — HEMOGLOBIN A1C
Hgb A1c MFr Bld: 6.3 % — ABNORMAL HIGH (ref 4.8–5.6)
Mean Plasma Glucose: 134 mg/dL

## 2019-11-05 LAB — I-STAT BETA HCG BLOOD, ED (MC, WL, AP ONLY): I-stat hCG, quantitative: <5 m[IU]/mL (ref <5)

## 2019-11-05 IMAGING — CR DG CHEST 2V
2 series · 2 of 2 positions shown · non-contrast
Comparison: [DATE]

CLINICAL DATA: Back pain

EXAM:
CHEST - 2 VIEW

[chest ap]
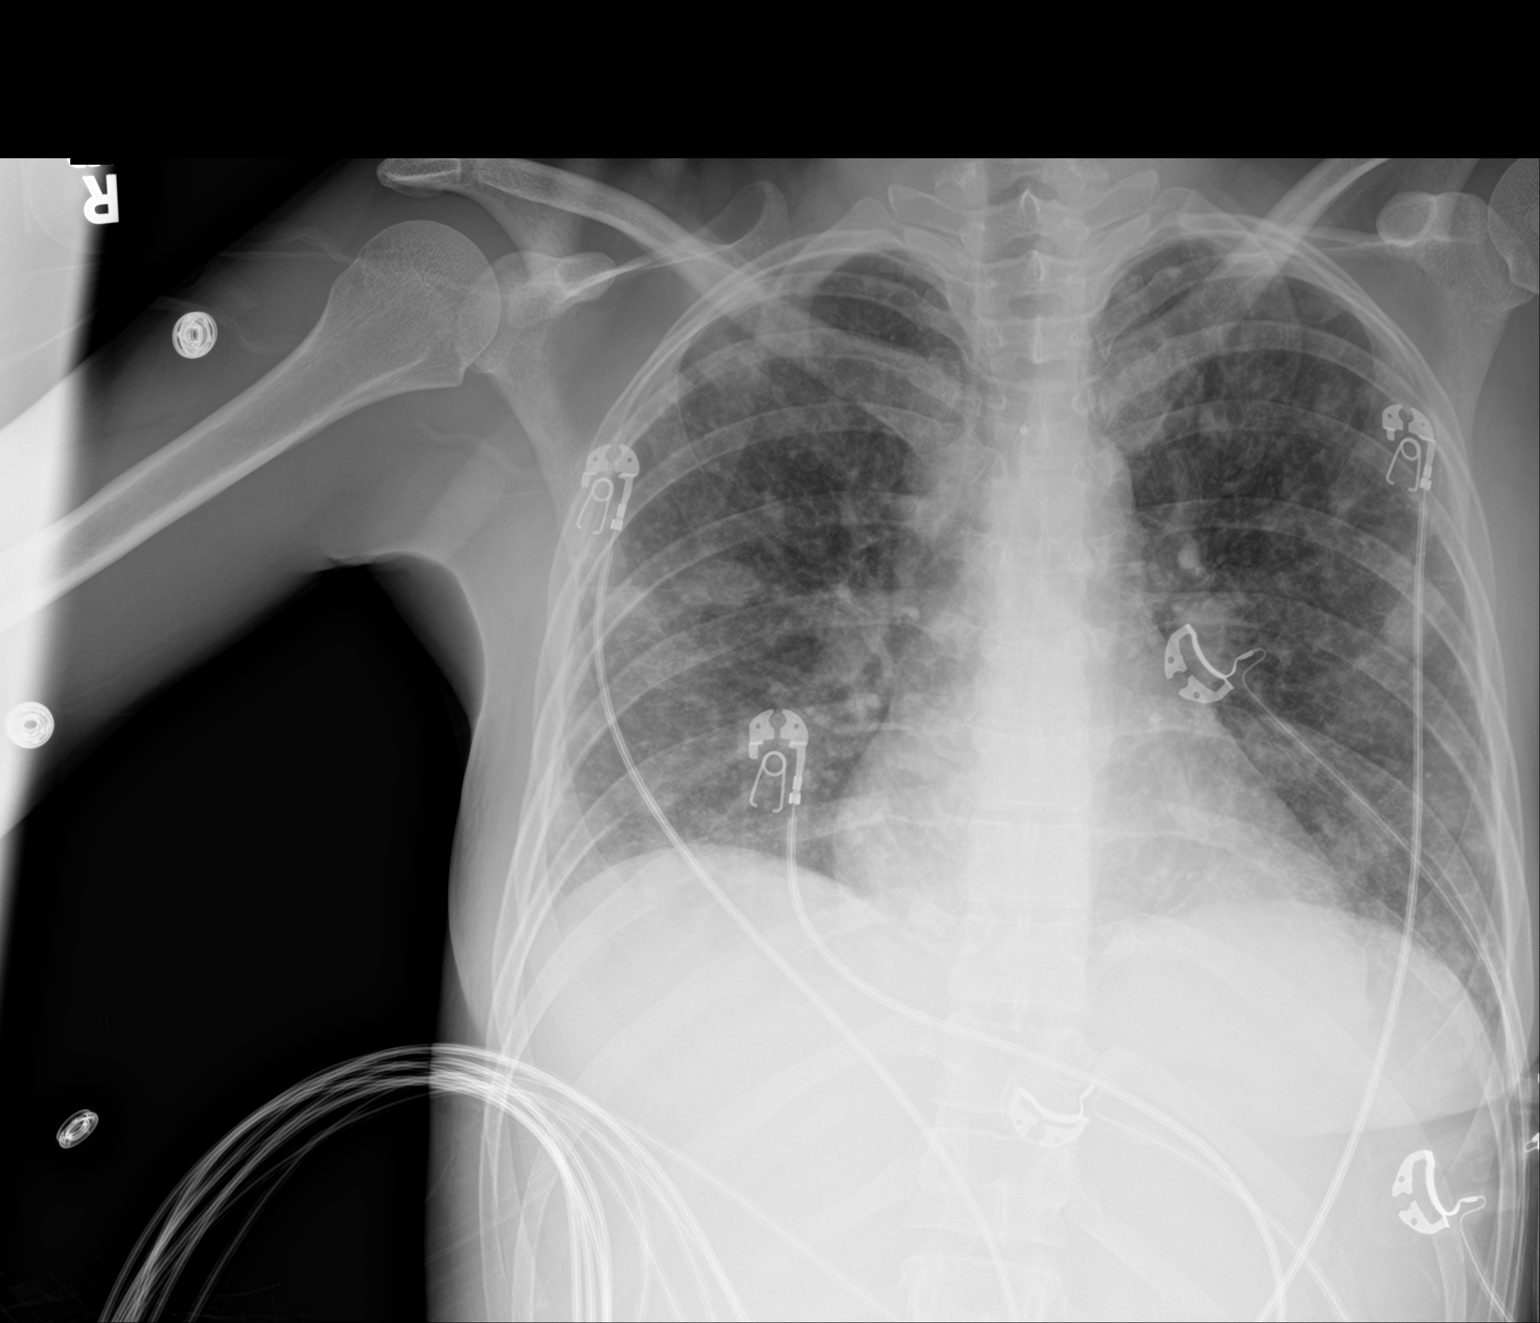

[chest lat]
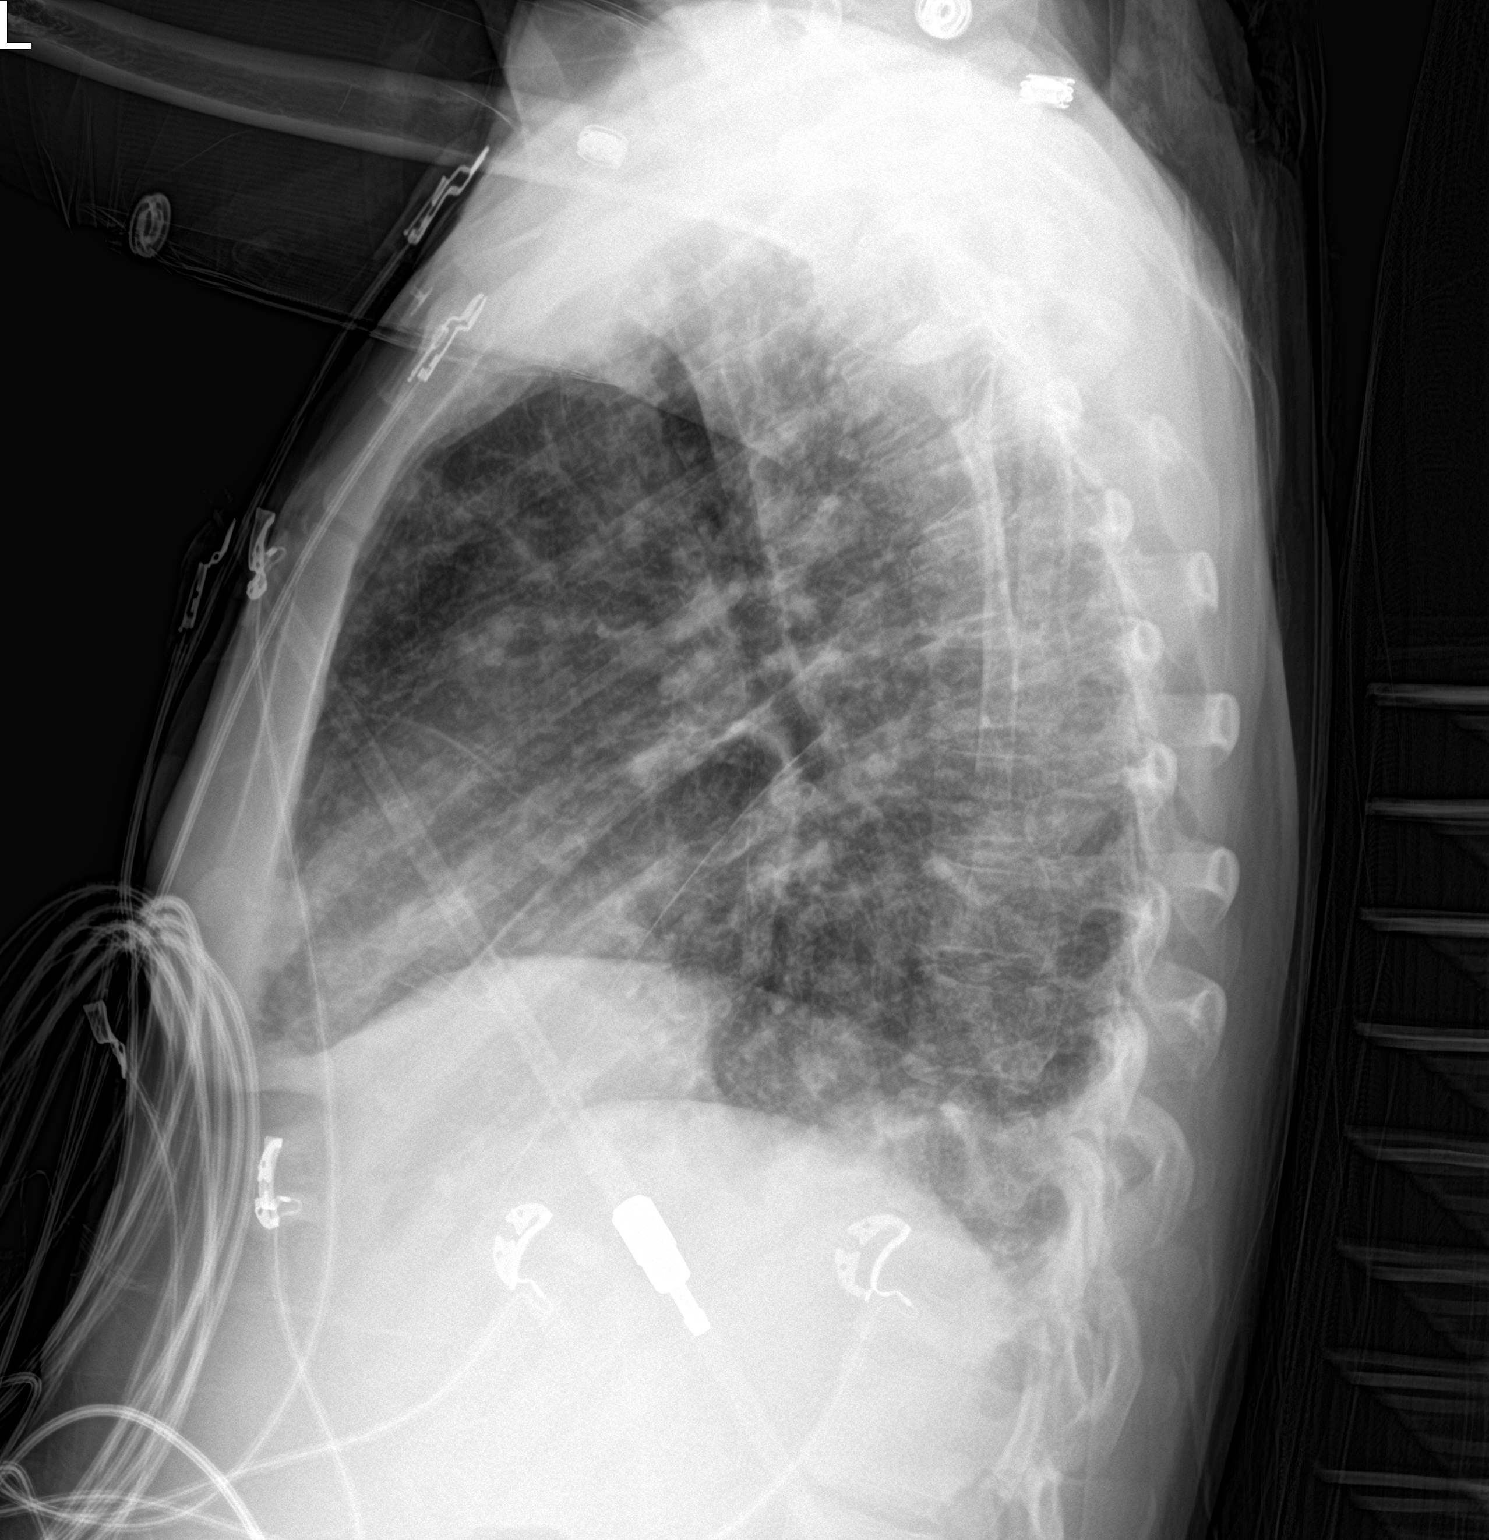

[2 of 2 positions shown; findings below may reference images not displayed]

FINDINGS: Cardiac shadow is stable. Scattered nodules are identified
throughout both lungs similar to that seen on prior CT examination
is suspicious for septic emboli. No new pleural effusion is seen. No
bony abnormality is noted.
IMPRESSION: Scattered nodules consistent with septic emboli similar to that seen
on prior CT examination.

No new focal abnormality is seen.

## 2019-11-05 MED ORDER — HEPARIN (PORCINE) 25000 UT/250ML-% IV SOLN
850.0000 [IU]/h | INTRAVENOUS | Status: DC
  Administered 2019-11-05: 850 [IU]/h via INTRAVENOUS
  Filled 2019-11-05: qty 250

## 2019-11-05 MED ORDER — ONDANSETRON HCL 4 MG PO TABS: 4.0000 mg | ORAL_TABLET | Freq: Four times a day (QID) | ORAL | Status: DC | PRN

## 2019-11-05 MED ORDER — SODIUM CHLORIDE 0.9 % IV SOLN: INTRAVENOUS | Status: DC

## 2019-11-05 MED ORDER — HEPARIN (PORCINE) 25000 UT/250ML-% IV SOLN
1400.0000 [IU]/h | INTRAVENOUS | Status: DC
  Administered 2019-11-05: 850 [IU]/h via INTRAVENOUS
  Filled 2019-11-05 (×2): qty 250

## 2019-11-05 MED ORDER — VANCOMYCIN HCL 750 MG/150ML IV SOLN
750.0000 mg | Freq: Two times a day (BID) | INTRAVENOUS | Status: DC
  Administered 2019-11-05: 750 mg via INTRAVENOUS
  Filled 2019-11-05 (×2): qty 150

## 2019-11-05 MED ORDER — SODIUM CHLORIDE 0.9% FLUSH
3.0000 mL | Freq: Two times a day (BID) | INTRAVENOUS | Status: DC
  Administered 2019-11-05 – 2019-11-07 (×4): 3 mL via INTRAVENOUS

## 2019-11-05 MED ORDER — NICOTINE 21 MG/24HR TD PT24
21.0000 mg | MEDICATED_PATCH | Freq: Every day | TRANSDERMAL | Status: DC
  Administered 2019-11-05 – 2019-11-06 (×2): 21 mg via TRANSDERMAL
  Filled 2019-11-05 (×2): qty 1

## 2019-11-05 MED ORDER — KETOROLAC TROMETHAMINE 15 MG/ML IJ SOLN
15.0000 mg | Freq: Once | INTRAMUSCULAR | Status: AC
  Administered 2019-11-05: 15 mg via INTRAVENOUS
  Filled 2019-11-05: qty 1

## 2019-11-05 MED ORDER — ACETAMINOPHEN 650 MG RE SUPP: 650.0000 mg | Freq: Four times a day (QID) | RECTAL | Status: DC | PRN

## 2019-11-05 MED ORDER — HEPARIN BOLUS VIA INFUSION
3000.0000 [IU] | Freq: Once | INTRAVENOUS | Status: AC
  Administered 2019-11-05: 3000 [IU] via INTRAVENOUS
  Filled 2019-11-05: qty 3000

## 2019-11-05 MED ORDER — HEPARIN BOLUS VIA INFUSION
3500.0000 [IU] | Freq: Once | INTRAVENOUS | Status: AC
  Administered 2019-11-05: 3500 [IU] via INTRAVENOUS
  Filled 2019-11-05: qty 3500

## 2019-11-05 MED ORDER — ACETAMINOPHEN 325 MG PO TABS: 650.0000 mg | ORAL_TABLET | Freq: Four times a day (QID) | ORAL | Status: DC | PRN

## 2019-11-05 MED ORDER — ONDANSETRON HCL 4 MG/2ML IJ SOLN: 4.0000 mg | Freq: Four times a day (QID) | INTRAMUSCULAR | Status: DC | PRN

## 2019-11-05 MED ORDER — KETOROLAC TROMETHAMINE 15 MG/ML IJ SOLN
15.0000 mg | Freq: Four times a day (QID) | INTRAMUSCULAR | Status: DC | PRN
  Administered 2019-11-06 – 2019-11-07 (×6): 15 mg via INTRAVENOUS
  Filled 2019-11-05 (×6): qty 1

## 2019-11-05 MED ORDER — HYDROXYZINE HCL 10 MG PO TABS
10.0000 mg | ORAL_TABLET | Freq: Three times a day (TID) | ORAL | Status: DC | PRN
  Administered 2019-11-06: 10 mg via ORAL
  Filled 2019-11-05 (×2): qty 1

## 2019-11-05 NOTE — ED Notes (Signed)
Security at bedside searching pt belongings. Pt is denying recent drug use despite fresh track marks.

## 2019-11-05 NOTE — Progress Notes (Signed)
Patient's mother approached the nursing station and reported that patient would like to leave hospital.   RN in to speak with patient, patient confirms that she would like to leave. RN asked patient is there anything that is making her feel like this or if there is anything that RN can do to assist her with comfort. Patient states "no there is not". RN informed patient that RN will notify MD about her wanting to leave. Patient states "there is nothing that the doctor can do I just want to get out of here". RN reminded patient about conversation between her and the MD this morning. Patient states that she is aware of what will happen if she leaves and does not complete treatment and would still like to leave. Patient states "I am not staying here".   Patient's mother is at bedside tearing eyed pleading with patient to say but patient states " I don't care, I'm not staying here".   MD made aware of patient wanting to leave. MD informed RN to remove all IV's.

## 2019-11-05 NOTE — ED Notes (Signed)
Per Alinda Money MD, start Vanc and then start Hep. Notified him that another IV team consult was placed to obtain another line d/t line incompatibility between vanc and hep.

## 2019-11-05 NOTE — Progress Notes (Signed)
[facility] PROGRESS NOTE    Progress Note  Amber [name]  ZOX:[id number] DOB: 1990-04-07 DOA: 11/03/2019 PCP: Patient, No Pcp Per     Brief Narrative:   Amber [name] is an 29 y.o. female past medical history significant for IVDA comes in for headache found febrile tachycardia bacteremia with  blood cultures on November 03, 2019 + for MRSA CT chest showed septic emboli with a left upper lobe cavitary nodule and small bilateral pleural effusion MRI showed some edema associated with the facet joint at L4-L5 but no effusion, show diffuse cellulitis along the heel pad no fluid collection or evidence of osteo.  2D echo was done  that showed an ill-defined mass in the right ventricular cavity of large size, and possible vegetation on the tricuspid valve, ID was consulted recommend to start IV vancomycin and repeat blood cultures on November 05, 2019, cardiology was consulted who recommended a cardiac MRI  Assessment/Plan:   Sepsis and on IV drug abuser due to cellulitis of the left foot and MRSA bacteremia/septic emboli: Blood cultures were positive x3 for MRSA. Repeated blood cultures ordered for November 05, 2019 MRI  Brain showed no acute findings. 2D echo showed possible vegetation high ventricular mass. Continue IV vancomycin discontinue cefepime.  Appreciate IDs assistance Continue tramadol, Toradol and Robaxin for pain. She was started on Suboxone today discontinue all narcotics.  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 Laneta Simmers recommended to consider anticoagulation.  MRI of the brain showed no acute findings. We will start her on IV heparin.  Polysubstance abuse: Continue Suboxone for mild withdrawals. HIV is negative hepatitis C positive. Continue tramadol and Toradol for pain avoid all narcotics.  Hypovolemic hyponatremia: Continue IV fluid hydration.   DVT prophylaxis: heparin Family  Communication:none Status is: Inpatient  Remains inpatient appropriate because:Hemodynamically unstable   Dispo: The patient is from: Home              Anticipated d/c is to: Home              Anticipated d/c date is: > 3 days              Patient currently is not medically stable to d/c.  Code Status:     Code Status Orders  (From admission, onward)         Start     Ordered   11/03/19 1526  Full code  Continuous        11/03/19 1526        Code Status History    Date Active Date Inactive Code Status Order ID Comments User Context   11/21/2017 0329 11/24/2017 2316 Full Code [id number]  Briscoe Deutscher, MD ED   09/29/2015 1359 09/30/2015 1943 Full Code [id number]  Samuel Jester, DO ED   Advance Care Planning Activity        IV Access:    Peripheral IV   Procedures and diagnostic studies:   CT Angio Chest PE W and/or Wo Contrast  Result Date: 11/03/2019 CLINICAL DATA:  Hypotensive, heroin user EXAM: CT ANGIOGRAPHY CHEST WITH CONTRAST TECHNIQUE: Multidetector CT imaging of the chest was performed using the standard protocol during bolus administration of intravenous contrast. Multiplanar CT image reconstructions and MIPs were obtained to evaluate the vascular anatomy. CONTRAST:  OMNIPAQUE IOHEXOL 350 MG/ML SOLN COMPARISON:  None. FINDINGS: Cardiovascular: Satisfactory opacification of the pulmonary arteries to the segmental level. No evidence of pulmonary embolism. Normal  heart size. No pericardial effusion. Thoracic aorta is normal in caliber. Mediastinum/Nodes: Mildly enlarged mediastinal and hilar lymph nodes are likely reactive. Thyroid and esophagus are unremarkable. Lungs/Pleura: Numerous nodules are present bilaterally with some demonstrating internal cavitation. Mild interlobular septal thickening. Mild bibasilar atelectasis. No pleural effusion or pneumothorax. Upper Abdomen: No acute abnormality. Musculoskeletal: Osseous structures are unremarkable. Review  of the MIP images confirms the above findings. IMPRESSION: No acute pulmonary embolism. Pulmonary findings most consistent with septic emboli. Minor interstitial pulmonary edema. These results were called by telephone at the time of interpretation on 11/03/2019 at 11:56 am to provider ADAM CURATOLO , who verbally acknowledged these results. Electronically Signed   By: Guadlupe SpanishPraneil  Patel M.D.   On: 11/03/2019 12:01   MR BRAIN WO CONTRAST  Result Date: 11/04/2019 CLINICAL DATA:  Bacteremia.  Rule out septic emboli. EXAM: MRI HEAD WITHOUT CONTRAST TECHNIQUE: Multiplanar, multiecho pulse sequences of the brain and surrounding structures were obtained without intravenous contrast. COMPARISON:  Head CT January 18, 2018. FINDINGS: Brain: No acute infarction, hemorrhage, hydrocephalus, extra-axial collection or mass lesion. The brain parenchyma has normal morphology and signal characteristics. Vascular: Normal flow voids. Skull and upper cervical spine: Diffuse decrease of the T1 signal within the visualized upper cervical spine, calvarium and mandible may represent red marrow reconversion. Correlate with CBC. Sinuses/Orbits: Minimal mucosal thickening of the right maxillary sinus. The orbits are maintained. Other: None. IMPRESSION: 1. No acute intracranial abnormality. Unremarkable MRI of the brain. 2. Diffuse decrease of the T1 signal within the visualized upper cervical spine, calvarium and mandible may represent red marrow reconversion. Correlate with CBC. Electronically Signed   By: Baldemar LenisKatyucia  De Macedo Rodrigues M.D.   On: 11/04/2019 20:52   MR THORACIC SPINE WO CONTRAST  Result Date: 11/04/2019 CLINICAL DATA:  Bacteremia.  Back pain. EXAM: MRI THORACIC AND LUMBAR SPINE WITHOUT CONTRAST TECHNIQUE: Multiplanar and multiecho pulse sequences of the thoracic and lumbar spine were obtained without intravenous contrast. The patient refused contrast administration. COMPARISON:  Chest CT yesterday. FINDINGS: MRI THORACIC  SPINE FINDINGS Alignment:  Normal Vertebrae: Normal Cord:  Normal Paraspinal and other soft tissues: Small effusions layering dependently, right more than left, with dependent atelectasis in the lower lobes right more than left. Patchy infiltrates as were shown by CT. Disc levels: No degenerative disc disease. No stenosis of the canal or foramina. No evidence of discitis osteomyelitis or infected facet joint. MRI LUMBAR SPINE FINDINGS Segmentation:  5 lumbar type vertebral bodies. Alignment:  Normal Vertebrae:  Vertebral bodies are normal. Conus medullaris: Extends to the L1-2 level and appears normal. Paraspinal and other soft tissues: Negative Disc levels: No evidence of degenerative disc disease or disc space infection. Patient has some edema associated with the facet joints at L4-5, without frank joint effusions. This could be degenerative in nature or could reflect the earliest changes of facet joint infection. IMPRESSION: 1. No evidence of discitis osteomyelitis or infected facet joint in the thoracic region. 2. No evidence of degenerative disc disease or disc space infection in the lumbar region. There is some edema associated with the facet joints at L4-5, without frank joint effusions. This could be degenerative in nature or could reflect the earliest changes of facet joint infection. 3. Small pleural effusions layering dependently, right more than left, with dependent atelectasis in the lower lobes right more than left. Focal pulmonary infiltrates as shown by CT yesterday. Electronically Signed   By: Paulina FusiMark  Shogry M.D.   On: 11/04/2019 15:34   MR LUMBAR SPINE  WO CONTRAST  Result Date: 11/04/2019 CLINICAL DATA:  Bacteremia.  Back pain. EXAM: MRI THORACIC AND LUMBAR SPINE WITHOUT CONTRAST TECHNIQUE: Multiplanar and multiecho pulse sequences of the thoracic and lumbar spine were obtained without intravenous contrast. The patient refused contrast administration. COMPARISON:  Chest CT yesterday. FINDINGS:  MRI THORACIC SPINE FINDINGS Alignment:  Normal Vertebrae: Normal Cord:  Normal Paraspinal and other soft tissues: Small effusions layering dependently, right more than left, with dependent atelectasis in the lower lobes right more than left. Patchy infiltrates as were shown by CT. Disc levels: No degenerative disc disease. No stenosis of the canal or foramina. No evidence of discitis osteomyelitis or infected facet joint. MRI LUMBAR SPINE FINDINGS Segmentation:  5 lumbar type vertebral bodies. Alignment:  Normal Vertebrae:  Vertebral bodies are normal. Conus medullaris: Extends to the L1-2 level and appears normal. Paraspinal and other soft tissues: Negative Disc levels: No evidence of degenerative disc disease or disc space infection. Patient has some edema associated with the facet joints at L4-5, without frank joint effusions. This could be degenerative in nature or could reflect the earliest changes of facet joint infection. IMPRESSION: 1. No evidence of discitis osteomyelitis or infected facet joint in the thoracic region. 2. No evidence of degenerative disc disease or disc space infection in the lumbar region. There is some edema associated with the facet joints at L4-5, without frank joint effusions. This could be degenerative in nature or could reflect the earliest changes of facet joint infection. 3. Small pleural effusions layering dependently, right more than left, with dependent atelectasis in the lower lobes right more than left. Focal pulmonary infiltrates as shown by CT yesterday. Electronically Signed   By: Paulina Fusi M.D.   On: 11/04/2019 15:34   CT ANKLE RIGHT W CONTRAST  Result Date: 11/04/2019 CLINICAL DATA:  Foot swelling bacteremia EXAM: CT OF THE RIGHT ANKLE WITH CONTRAST TECHNIQUE: Multidetector CT imaging of the right ankle was performed following the standard protocol during bolus administration of intravenous contrast. CONTRAST:  OMNIPAQUE IOHEXOL 300 MG/ML  SOLN COMPARISON:   None. FINDINGS: Bones/Joint/Cartilage No fracture or dislocation. No areas of cortical destruction or periosteal reaction are seen. The articular surfaces appear maintained. No large ankle joint effusion. Ligaments Suboptimally assessed by CT. Muscles and Tendons The muscles surrounding the ankle and foot appear to be intact without focal atrophy or tear. The flexor and extensor tendons are intact. The Achilles tendon is intact. Soft tissues: There is subcutaneous edema seen along the posterior heel pad with mild skin thickening. There also appears to be mildly heterogeneous density with thickening in the middle cord of the plantar fascia, however it is intact. No loculated fluid collections are noted. IMPRESSION: Findings which could be suggestive of diffuse cellulitis along the heel pad. No loculated fluid collections or definite evidence of osteomyelitis. Electronically Signed   By: Jonna Clark M.D.   On: 11/04/2019 21:45   CT FOOT LEFT W CONTRAST  Result Date: 11/04/2019 CLINICAL DATA:  Foot swelling bacteremia EXAM: CT OF THE RIGHT ANKLE WITH CONTRAST TECHNIQUE: Multidetector CT imaging of the right ankle was performed following the standard protocol during bolus administration of intravenous contrast. CONTRAST:  OMNIPAQUE IOHEXOL 300 MG/ML  SOLN COMPARISON:  None. FINDINGS: Bones/Joint/Cartilage No fracture or dislocation. No areas of cortical destruction or periosteal reaction are seen. The articular surfaces appear maintained. No large ankle joint effusion. Ligaments Suboptimally assessed by CT. Muscles and Tendons The muscles surrounding the ankle and foot appear to be intact  without focal atrophy or tear. The flexor and extensor tendons are intact. The Achilles tendon is intact. Soft tissues: There is subcutaneous edema seen along the posterior heel pad with mild skin thickening. There also appears to be mildly heterogeneous density with thickening in the middle cord of the plantar fascia,  however it is intact. No loculated fluid collections are noted. IMPRESSION: Findings which could be suggestive of diffuse cellulitis along the heel pad. No loculated fluid collections or definite evidence of osteomyelitis. Electronically Signed   By: Jonna Clark M.D.   On: 11/04/2019 21:45   DG Chest Port 1 View  Result Date: 11/03/2019 CLINICAL DATA:  Sepsis, IV drug use EXAM: PORTABLE CHEST 1 VIEW COMPARISON:  09/07/2014 FINDINGS: Since the prior examination, there have developed multiple peripherally distributed pulmonary nodules within the lungs bilaterally. Given the patient's history of drug use, septic embolization should be considered. Atypical infection, such as fungal pneumonia, or metastatic disease are considered less likely, but could present similarly. No confluent pulmonary infiltrates. Cardiac size within normal limits. Pulmonary vascularity is normal. No acute bone abnormality. IMPRESSION: Interval development of multiple pulmonary nodules. This could be better assessed with CT examination, if indicated. Electronically Signed   By: Helyn Numbers MD   On: 11/03/2019 08:58   ECHOCARDIOGRAM COMPLETE  Result Date: 11/04/2019    ECHOCARDIOGRAM REPORT   Patient Name:   Amber [name] Date of Exam: 11/04/2019 Medical Rec #:  [id number]        Height:       62.0 in Accession #:    [id number]       Weight:       105.0 lb Date of Birth:  1990-07-27         BSA:          1.454 m Patient Age:    29 years         BP:           95/58 mmHg Patient Gender: F                HR:           97 bpm. Exam Location:  Inpatient Procedure: 2D Echo Indications:    Bacteremia  History:        Patient has no prior history of Echocardiogram examinations.                 Risk Factors:IV Drug abuse.  Sonographer:    Thurman Coyer RDCS (AE) Referring Phys: [id number] Teddy Spike IMPRESSIONS  1. Left ventricular ejection fraction, by estimation, is 60 to 65%. The left ventricle has normal function. The left ventricle  has no regional wall motion abnormalities. Left ventricular diastolic parameters were normal.  2. Right ventricular systolic function There is an ill defined mass encompassing a large portion of the RV cavity. Recommend cardiac MRI for further evaulation.. The right ventricular size is normal. There is normal pulmonary artery systolic pressure. The estimated right ventricular systolic pressure is 18.4 mmHg.  3. The mitral valve is normal in structure. No evidence of mitral valve regurgitation. No evidence of mitral stenosis.  4. Possible vegetation on TV leaflet. Consider TEE for further evaulation. . The tricuspid valve is abnormal.  5. The aortic valve was not well visualized. Aortic valve regurgitation is not visualized. No aortic stenosis is present.  6. The inferior vena cava is normal in size with greater than 50% respiratory variability, suggesting right atrial pressure of 3 mmHg.Recommend Cardiac MRI  to evaluated abnormality in RV cavity. ALso ecoh concerning for TV endocardidits. FINDINGS  Left Ventricle: Left ventricular ejection fraction, by estimation, is 60 to 65%. The left ventricle has normal function. The left ventricle has no regional wall motion abnormalities. The left ventricular internal cavity size was normal in size. There is  no left ventricular hypertrophy. Left ventricular diastolic parameters were normal. Normal left ventricular filling pressure. Right Ventricle: The right ventricular size is normal. No increase in right ventricular wall thickness. Right ventricular systolic function There is an ill defined mass encompassing a large portion of the RV cavity. Recommend cardiac MRI for further evaulation. There is normal pulmonary artery systolic pressure. The tricuspid regurgitant velocity is 1.96 m/s, and with an assumed right atrial pressure of 3 mmHg, the estimated right ventricular systolic pressure is 18.4 mmHg. Left Atrium: Left atrial size was normal in size. Right Atrium: Right  atrial size was normal in size. Pericardium: There is no evidence of pericardial effusion. Mitral Valve: The mitral valve is normal in structure. No evidence of mitral valve regurgitation. No evidence of mitral valve stenosis. Tricuspid Valve: Possible vegetation on TV leaflet. Consider TEE for further evaulation. The tricuspid valve is abnormal. Tricuspid valve regurgitation is mild . No evidence of tricuspid stenosis. Aortic Valve: The aortic valve was not well visualized. Aortic valve regurgitation is not visualized. No aortic stenosis is present. Pulmonic Valve: The pulmonic valve was normal in structure. Pulmonic valve regurgitation is not visualized. No evidence of pulmonic stenosis. Aorta: The aortic root is normal in size and structure. Venous: The inferior vena cava is normal in size with greater than 50% respiratory variability, suggesting right atrial pressure of 3 mmHg. IAS/Shunts: No atrial level shunt detected by color flow Doppler.  LEFT VENTRICLE PLAX 2D LVIDd:         3.59 cm  Diastology LVIDs:         2.31 cm  LV e' medial:    9.57 cm/s LV PW:         0.61 cm  LV E/e' medial:  7.8 LV IVS:        0.80 cm  LV e' lateral:   8.70 cm/s LVOT diam:     2.10 cm  LV E/e' lateral: 8.5 LV SV:         55 LV SV Index:   38 LVOT Area:     3.46 cm  RIGHT VENTRICLE RV S prime:     12.70 cm/s TAPSE (M-mode): 2.0 cm LEFT ATRIUM             Index       RIGHT ATRIUM           Index LA diam:        1.60 cm 1.10 cm/m  RA Area:     13.70 cm LA Vol (A2C):   31.6 ml 21.74 ml/m RA Volume:   32.10 ml  22.08 ml/m LA Vol (A4C):   20.8 ml 14.31 ml/m LA Biplane Vol: 26.7 ml 18.37 ml/m  AORTIC VALVE LVOT Vmax:   90.90 cm/s LVOT Vmean:  55.500 cm/s LVOT VTI:    0.158 m  AORTA Ao Root diam: 2.70 cm MITRAL VALVE               TRICUSPID VALVE MV Area (PHT): 3.37 cm    TR Peak grad:   15.4 mmHg MV Decel Time: 225 msec    TR Vmax:        196.00 cm/s MV E  velocity: 74.20 cm/s MV A velocity: 60.60 cm/s  SHUNTS MV E/A ratio:  1.22         Systemic VTI:  0.16 m                            Systemic Diam: 2.10 cm Armanda Magic MD Electronically signed by Armanda Magic MD Signature Date/Time: 11/04/2019/2:03:31 PM    Final      Medical Consultants:    None.  Anti-Infectives:   IV vanc  Subjective:    Amber [name] relates pain all over her body.  Objective:    Vitals:   11/05/19 0200 11/05/19 0400 11/05/19 0520 11/05/19 0600  BP: 111/76 105/60  116/76  Pulse: 96 94    Resp: (!) 38 (!) 44  (!) 42  Temp:  98.4 F (36.9 C)    TempSrc:  Oral    SpO2:  100%    Weight:   50.4 kg   Height:       SpO2: 100 % O2 Flow Rate (L/min): 1 L/min   Intake/Output Summary (Last 24 hours) at 11/05/2019 0703 Last data filed at 11/05/2019 0300 Gross per 24 hour  Intake 2000.99 ml  Output --  Net 2000.99 ml   Filed Weights   11/04/19 1837 11/04/19 2254 11/05/19 0520  Weight: 47.4 kg 50.4 kg 50.4 kg    Exam: General exam: In no acute distress. Respiratory system: Good air movement and clear to auscultation. Cardiovascular system: S1 & S2 heard, RRR. No JVD. Gastrointestinal system: Abdomen is nondistended, soft and nontender.  Extremities: No pedal edema. Skin: No rashes, lesions or ulcers Psychiatry: Judgement and insight appear normal. Mood & affect appropriate.    Data Reviewed:    Labs: Basic Metabolic Panel: Recent Labs  Lab 11/03/19 0950 11/03/19 0950 11/03/19 2240 11/04/19 0434  NA 128*  123*  --  131* 129*  K 4.2  4.1   < > 3.8 3.5  CL 89*  86*  --  95* 96*  CO2 22  22  --  25 24  GLUCOSE 88  92  --  105* 134*  BUN 39*  39*  --  30* 22*  CREATININE 1.33*  1.33*  --  0.77 0.66  CALCIUM 8.2*  8.0*  --  7.9* 7.5*  PHOS 4.2  --  3.6 2.6   < > = values in this interval not displayed.   GFR Estimated Creatinine Clearance: 82.1 mL/min (by C-G formula based on SCr of 0.66 mg/dL). Liver Function Tests: Recent Labs  Lab 11/03/19 0950 11/03/19 2240 11/04/19 0434 11/04/19 1550   AST 84*  --   --  29  ALT 36  --   --  23  ALKPHOS 133*  --   --  103  BILITOT 1.0  --   --  0.8  PROT 7.5  --   --  6.0*  ALBUMIN 2.2*  2.2* 1.8* 1.5* 1.7*   No results for input(s): LIPASE, AMYLASE in the last 168 hours. No results for input(s): AMMONIA in the last 168 hours. Coagulation profile Recent Labs  Lab 11/03/19 0950 11/04/19 0434  INR 1.3* 1.5*   COVID-19 Labs  No results for input(s): DDIMER, FERRITIN, LDH, CRP in the last 72 hours.  Lab Results  Component Value Date   SARSCOV2NAA NEGATIVE 11/03/2019   SARSCOV2NAA NEGATIVE 01/11/2019    CBC: Recent Labs  Lab 11/03/19 0950 11/04/19 0434  WBC 10.7* 10.3  NEUTROABS 9.3*  --   HGB 12.0 9.3*  HCT 35.6* 27.9*  MCV 79.3* 80.2  PLT 173 193   Cardiac Enzymes: Recent Labs  Lab 11/03/19 2240  CKTOTAL 90   BNP (last 3 results) No results for input(s): PROBNP in the last 8760 hours. CBG: No results for input(s): GLUCAP in the last 168 hours. D-Dimer: No results for input(s): DDIMER in the last 72 hours. Hgb A1c: Recent Labs    11/04/19 0434  HGBA1C 6.3*   Lipid Profile: No results for input(s): CHOL, HDL, LDLCALC, TRIG, CHOLHDL, LDLDIRECT in the last 72 hours. Thyroid function studies: Recent Labs    11/03/19 2240  TSH 0.402   Anemia work up: No results for input(s): VITAMINB12, FOLATE, FERRITIN, TIBC, IRON, RETICCTPCT in the last 72 hours. Sepsis Labs: Recent Labs  Lab 11/03/19 0949 11/03/19 0950 11/03/19 1229 11/03/19 2134 11/03/19 2240 11/04/19 0434  PROCALCITON  --   --   --   --   --  12.02  WBC  --  10.7*  --   --   --  10.3  LATICACIDVEN 2.9*  --  1.7 1.6 1.7  --    Microbiology Recent Results (from the past 240 hour(s))  Urine culture     Status: Abnormal (Preliminary result)   Collection Time: 11/03/19  8:18 AM   Specimen: In/Out Cath Urine  Result Value Ref Range Status   Specimen Description   Final    IN/OUT CATH URINE Performed at [facility],  [address] [place], [place] [address]    Special Requests   Final    NONE Performed at [facility] [address] [place], [place] [address]    Culture (A)  Final    >=100,000 COLONIES/mL ESCHERICHIA COLI SUSCEPTIBILITIES TO FOLLOW Performed at [facility], [address] [place], [place] [address]    Report Status PENDING  Incomplete  Respiratory Panel by RT PCR (Flu A&B, Covid) - Nasopharyngeal Swab     Status: None   Collection Time: 11/03/19  9:13 AM   Specimen: Nasopharyngeal Swab  Result Value Ref Range Status   SARS Coronavirus 2 by RT PCR NEGATIVE NEGATIVE Final    Comment: (NOTE) SARS-CoV-2 target nucleic acids are NOT DETECTED.  The SARS-CoV-2 RNA is generally detectable in upper respiratoy specimens during the acute phase of infection. The lowest concentration of SARS-CoV-2 viral copies this assay can detect is 131 copies/mL. A negative result does not preclude SARS-Cov-2 infection and should not be used as the sole basis for treatment or other patient management decisions. A negative result may occur with  improper specimen collection/handling, submission of specimen other than nasopharyngeal swab, presence of viral mutation(s) within the areas targeted by this assay, and inadequate number of viral copies (<131 copies/mL). A negative result must be combined with clinical observations, patient history, and epidemiological information. The expected result is Negative.  Fact Sheet for Patients:  https://www.moore.com/  Fact Sheet for Healthcare Providers:  https://www.young.biz/  This test is no t yet approved or cleared by the [place] FDA and  has been authorized for detection and/or diagnosis of SARS-CoV-2 by FDA under an Emergency Use Authorization (EUA). This EUA will remain  in effect (meaning this test can be used) for the duration of the COVID-19 declaration under Section  564(b)(1) of the Act, 21 U.S.C. section 360bbb-3(b)(1), unless the authorization is terminated or revoked sooner.     Influenza A by PCR NEGATIVE NEGATIVE Final  Influenza B by PCR NEGATIVE NEGATIVE Final    Comment: (NOTE) The Xpert Xpress SARS-CoV-2/FLU/RSV assay is intended as an aid in  the diagnosis of influenza from Nasopharyngeal swab specimens and  should not be used as a sole basis for treatment. Nasal washings and  aspirates are unacceptable for Xpert Xpress SARS-CoV-2/FLU/RSV  testing.  Fact Sheet for Patients: https://www.moore.com/  Fact Sheet for Healthcare Providers: https://www.young.biz/  This test is not yet approved or cleared by the [place] FDA and  has been authorized for detection and/or diagnosis of SARS-CoV-2 by  FDA under an Emergency Use Authorization (EUA). This EUA will remain  in effect (meaning this test can be used) for the duration of the  Covid-19 declaration under Section 564(b)(1) of the Act, 21  U.S.C. section 360bbb-3(b)(1), unless the authorization is  terminated or revoked. Performed at [facility], [address] [place], [place] [address]   Blood Culture (routine x 2)     Status: Abnormal (Preliminary result)   Collection Time: 11/03/19  9:50 AM   Specimen: BLOOD RIGHT HAND  Result Value Ref Range Status   Specimen Description   Final    BLOOD RIGHT HAND Performed at [facility] [address], [place], [place] [address]    Special Requests   Final    BOTTLES DRAWN AEROBIC ONLY Blood Culture adequate volume Performed at [facility], [address], [place], [place] [address]    Culture  Setup Time   Final    AEROBIC BOTTLE ONLY GRAM POSITIVE COCCI CRITICAL VALUE NOTED.  VALUE IS CONSISTENT WITH PREVIOUSLY REPORTED AND CALLED VALUE. Performed at [facility], [address], [place], [place] [address]    Culture  STAPHYLOCOCCUS AUREUS (A)  Final   Report Status PENDING  Incomplete  Blood Culture (routine x 2)     Status: Abnormal (Preliminary result)   Collection Time: 11/03/19  9:50 AM   Specimen: BLOOD  Result Value Ref Range Status   Specimen Description   Final    BLOOD LEFT ARM Performed at [facility], [address], [place], [place] [address]    Special Requests   Final    BOTTLES DRAWN AEROBIC AND ANAEROBIC Blood Culture adequate volume Performed at [facility], [address] [place], [place] [address]    Culture  Setup Time   Final    IN BOTH AEROBIC AND ANAEROBIC BOTTLES GRAM POSITIVE COCCI CRITICAL VALUE NOTED.  VALUE IS CONSISTENT WITH PREVIOUSLY REPORTED AND CALLED VALUE. Performed at [facility], [address], [place], [place] [address]    Culture STAPHYLOCOCCUS AUREUS (A)  Final   Report Status PENDING  Incomplete  Culture, blood (Routine X 2) w Reflex to ID Panel     Status: Abnormal (Preliminary result)   Collection Time: 11/03/19  9:52 AM   Specimen: BLOOD  Result Value Ref Range Status   Specimen Description   Final    BLOOD RIGHT ARM Performed at [facility], [address], [place], [place] [address]    Special Requests   Final    BOTTLES DRAWN AEROBIC ONLY Blood Culture adequate volume Performed at [facility], [address], [place], [place] [address]    Culture  Setup Time   Final    AEROBIC BOTTLE ONLY GRAM POSITIVE COCCI Organism ID to follow CRITICAL RESULT CALLED TO, READ BACK BY AND VERIFIED WITH:  PHARMD E JACKSON 11/03/19  AT 1157 SK    Culture (A)  Final    STAPHYLOCOCCUS AUREUS SUSCEPTIBILITIES TO FOLLOW Performed at [facility], [address], [place], [place] [address]    Report Status PENDING  Incomplete  Blood Culture ID Panel (Reflexed)     Status: Abnormal   Collection Time: 11/03/19  9:52 AM  Result Value Ref Range Status   Enterococcus  faecalis NOT DETECTED NOT DETECTED Final   Enterococcus Faecium NOT DETECTED NOT DETECTED Final   Listeria monocytogenes NOT DETECTED NOT DETECTED Final   Staphylococcus species DETECTED (A) NOT DETECTED Final    Comment: CRITICAL RESULT CALLED TO, READ BACK BY AND VERIFIED WITH: PHARMD E JACKSON 11/03/19 AT 2357 SK    Staphylococcus aureus (BCID) DETECTED (A) NOT DETECTED Final    Comment: Methicillin (oxacillin)-resistant Staphylococcus aureus (MRSA). MRSA is predictably resistant to beta-lactam antibiotics (except ceftaroline). Preferred therapy is vancomycin unless clinically contraindicated. Patient requires contact precautions if  hospitalized. CRITICAL RESULT CALLED TO, READ BACK BY AND VERIFIED WITH: PHARMD E JACKSON 11/03/19 AT 2357 SK    Staphylococcus epidermidis NOT DETECTED NOT DETECTED Final   Staphylococcus lugdunensis NOT DETECTED NOT DETECTED Final   Streptococcus species NOT DETECTED NOT DETECTED Final   Streptococcus agalactiae NOT DETECTED NOT DETECTED Final   Streptococcus pneumoniae NOT DETECTED NOT DETECTED Final   Streptococcus pyogenes NOT DETECTED NOT DETECTED Final   A.calcoaceticus-baumannii NOT DETECTED NOT DETECTED Final   Bacteroides fragilis NOT DETECTED NOT DETECTED Final   Enterobacterales NOT DETECTED NOT DETECTED Final   Enterobacter cloacae complex NOT DETECTED NOT DETECTED Final   Escherichia coli NOT DETECTED NOT DETECTED Final   Klebsiella aerogenes NOT DETECTED NOT DETECTED Final   Klebsiella oxytoca NOT DETECTED NOT DETECTED Final   Klebsiella pneumoniae NOT DETECTED NOT DETECTED Final   Proteus species NOT DETECTED NOT DETECTED Final   Salmonella species NOT DETECTED NOT DETECTED Final   Serratia marcescens NOT DETECTED NOT DETECTED Final   Haemophilus influenzae NOT DETECTED NOT DETECTED Final   Neisseria meningitidis NOT DETECTED NOT DETECTED Final   Pseudomonas aeruginosa NOT DETECTED NOT DETECTED Final   Stenotrophomonas maltophilia NOT  DETECTED NOT DETECTED Final   Candida albicans NOT DETECTED NOT DETECTED Final   Candida auris NOT DETECTED NOT DETECTED Final   Candida glabrata NOT DETECTED NOT DETECTED Final   Candida krusei NOT DETECTED NOT DETECTED Final   Candida parapsilosis NOT DETECTED NOT DETECTED Final   Candida tropicalis NOT DETECTED NOT DETECTED Final   Cryptococcus neoformans/gattii NOT DETECTED NOT DETECTED Final   Meth resistant mecA/C and MREJ DETECTED (A) NOT DETECTED Final    Comment: CRITICAL RESULT CALLED TO, READ BACK BY AND VERIFIED WITH: PHARMD E JACKSON 11/03/19 AT 2357 SK Performed at [facility], [address] [place], [place] [address]      Medications:   . buprenorphine-naloxone  1 tablet Sublingual BID  . enoxaparin (LOVENOX) injection  40 mg Subcutaneous Q24H  . mouth rinse  15 mL Mouth Rinse BID   Continuous Infusions: . sodium chloride 125 mL/hr at 11/05/19 0519  . methocarbamol (ROBAXIN) IV Stopped (11/04/19 1348)  . vancomycin 750 mg (11/04/19 2206)      LOS: 2 days   Marinda Elk  [facility]  11/05/2019, 7:03 AM

## 2019-11-05 NOTE — ED Notes (Signed)
IV team at bedside 

## 2019-11-05 NOTE — Progress Notes (Signed)
Pharmacy Antibiotic Note  Amber [name] is a 29 y.o. female admitted on 11/05/2019 with sepsis.  Pharmacy has been consulted for vancomycin + cefepime dosing.  Pt is a 29 yoF with PMH significant for IVDU, presenting with fatigue, weakness. BCID now + for MRSA bacteremia. Left AMA earlier today - now back. WBC 12.7, LA 2.8, Scr 0.66 (CrCl 82 mL/min). Presenting with severe back pain. Last dose of vancomycin was on 10/23@1111 .  Plan:  Continue vancomycin to 750 mg IV q12h for goal VT 15-20 mcg/mL and/or AUC 400-550  Follow renal function and culture data   Follow for ABX DOT     Temp (24hrs), Avg:99.3 F (37.4 C), Min:98.4 F (36.9 C), Max:100.5 F (38.1 C)  Recent Labs  Lab 11/03/19 0949 11/03/19 0950 11/03/19 1229 11/03/19 2134 11/03/19 2240 11/04/19 0434 11/05/19 2014  WBC  --  10.7*  --   --   --  10.3 12.7*  CREATININE  --  1.33*  1.33*  --   --  0.77 0.66 0.66  LATICACIDVEN 2.9*  --  1.7 1.6 1.7  --  2.8*    Estimated Creatinine Clearance: 82.1 mL/min (by C-G formula based on SCr of 0.66 mg/dL).    No Known Allergies  Antimicrobials this admission: vancomycin 10/21 >>  cefepime 10/21 >>   Dose adjustments this admission:  Microbiology results: 10/21 Resp panel: neg 10/21 UCx: >100K E.coli (S pending) 10/21 BCx: MRSA  Thank you for allowing pharmacy to be a part of this patient's care.  Sherron Monday, PharmD, BCCCP Clinical Pharmacist  Phone: [phone] 11/05/2019 10:18 PM  Please check AMION for all [facility] Pharmacy phone numbers After 10:00 PM, call Main Pharmacy [phone]

## 2019-11-05 NOTE — Progress Notes (Signed)
ANTICOAGULATION CONSULT NOTE - Initial Consult  Pharmacy Consult for heparin Indication: RV mass  No Known Allergies  Patient Measurements: Height: 5\' 2"  (157.5 cm) Weight: 50.4 kg (111 lb 1.8 oz) IBW/kg (Calculated) : 50.1 Heparin Dosing Weight: 50.4  Vital Signs: Temp: 98.4 F (36.9 C) (10/23 0400) Temp Source: Oral (10/23 0400) BP: 116/76 (10/23 0600) Pulse Rate: 94 (10/23 0400)  Labs: Recent Labs    11/03/19 0950 11/03/19 2240 11/04/19 0434  HGB 12.0  --  9.3*  HCT 35.6*  --  27.9*  PLT 173  --  193  APTT 32  --   --   LABPROT 15.8*  --  18.0*  INR 1.3*  --  1.5*  CREATININE 1.33*  1.33* 0.77 0.66  CKTOTAL  --  90  --   TROPONINIHS  --  14  --     Estimated Creatinine Clearance: 82.1 mL/min (by C-G formula based on SCr of 0.66 mg/dL).   Medical History: Past Medical History:  Diagnosis Date  . Heroin abuse (HCC)   . Kidney stones   . MRSA (methicillin resistant staph aureus) culture positive   . Ovarian cyst   . UTI (lower urinary tract infection)     Medications:  Scheduled:  . buprenorphine-naloxone  1 tablet Sublingual BID  . enoxaparin (LOVENOX) injection  40 mg Subcutaneous Q24H  . 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 Pharmacy has been consulted to start heparin.   No AC prior to admission. On prophylactic enox during admission. Hgb dropped, PLT stable, baseline INR slightly elevated 1.5.   Goal of Therapy:  Heparin level 0.3-0.7 units/ml Monitor platelets by anticoagulation protocol: Yes   Plan:  Give 3500 units bolus x 1 Start heparin infusion at 850 units/hr Check anti-Xa level in 6 hours and daily while on heparin Continue to monitor H&H and platelets  37, PharmD PGY1 Acute Care Pharmacy Resident Phone: [phone] 11/05/2019 7:43 AM  Please check [email or web address] for unit specific  pharmacy phone numbers.

## 2019-11-05 NOTE — ED Notes (Signed)
Date and time results received: 11/05/19 2112 (use smartphrase ".now" to insert current time)  Test: lactic acid Critical Value: 2.8  Name of Provider Notified: Dr. Charm Barges  Orders Received? Or Actions Taken?:in dept

## 2019-11-05 NOTE — Consult Note (Signed)
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 Leia Alf. Amber [name] is an 29 y.o. female.  HPI:   The patient is a 29 year old woman with a history of heroin IVDA who presented with a 3-week history of fatigue and weakness and recent headache.  She was diagnosed with MRSA bacteremia.  Chest x-ray showed multiple pulmonary nodules.  CT of the chest on 11/03/2019 showed multiple bilateral septic pulmonary emboli.  2D echocardiogram yesterday showed a possible vegetation on the tricuspid valve leaflet with no significant TR.  Right ventricular systolic function was normal with an ill-defined mass encompassing a large portion RV cavity.  There were no vegetation seen on aortic or mitral valve.  Left ventricular systolic function was normal with an ejection fraction of 60 to 65%.  Follow-up blood cultures are negative so far.  Urine culture showed greater than 100,000 colonies of E. coli.  She had an MRI of the brain that was unremarkable.  MRI of the thoracic and lumbar spine showed no evidence of discitis, osteomyelitis or abscess.  CT of the right ankle showed no definite infection with some soft tissue edema along the posterior heel pad and mild skin thickening.   Past Medical History:  Diagnosis Date  . Heroin abuse (HCC)   . Kidney stones   . MRSA (methicillin resistant staph aureus) culture positive   . Ovarian cyst   . UTI (lower urinary tract infection)     Past Surgical History:  Procedure Laterality Date  . FINGER SURGERY      No family history on file.  Social History:  reports that she has quit smoking. Her smoking use included cigarettes. She has never used smokeless tobacco. She reports current drug use. Drugs: Marijuana and IV. She reports that she does not drink alcohol.  Allergies: No  Known Allergies  Medications:  I have reviewed the patient's current medications. Prior to Admission:  Medications Prior to Admission  Medication Sig Dispense Refill Last Dose  . acetaminophen (TYLENOL) 500 MG tablet Take 1,000 mg by mouth every 6 (six) hours as needed.   11/03/2019 at Unknown time  . cephALEXin (KEFLEX) 500 MG capsule Take 1 capsule (500 mg total) by mouth 4 (four) times daily. (Patient not taking: Reported on 11/03/2019) 20 capsule 0 Not Taking at Unknown time  . doxycycline (VIBRAMYCIN) 100 MG capsule Take 1 capsule (100 mg total) by mouth 2 (two) times daily. (Patient not taking: Reported on 11/03/2019) 20 capsule 0 Not Taking at Unknown time  . ibuprofen (ADVIL) 600 MG tablet Take 1 tablet (600 mg total) by mouth every 6 (six) hours as needed. (Patient not taking: Reported on 11/03/2019) 30 tablet 0 Not Taking at Unknown time  . ondansetron (ZOFRAN ODT) 4 MG disintegrating tablet Take 1 tablet (4 mg total) by mouth every 8 (eight) hours as needed for nausea or vomiting. (Patient not taking: Reported on 11/03/2019) 20 tablet 0 Not Taking at Unknown time   Scheduled: . buprenorphine-naloxone  1 tablet Sublingual BID  . heparin  3,500 Units Intravenous Once  . mouth rinse  15 mL Mouth Rinse BID   Continuous: . sodium chloride    . heparin    . methocarbamol (ROBAXIN) IV Stopped (11/04/19 1348)  .  vancomycin 750 mg (11/04/19 2206)   ONG:[id number] **OR** acetaminophen, buprenorphine-naloxone, hydrOXYzine, ketorolac, loperamide, methocarbamol (ROBAXIN) IV, ondansetron  Results for orders placed or performed during the hospital encounter of 11/03/19 (from the past 48 hour(s))  Urinalysis, Routine w reflex microscopic Urine, Clean Catch     Status: Abnormal   Collection Time: 11/03/19  8:18 AM  Result Value Ref Range   Color, Urine AMBER (A) YELLOW    Comment: BIOCHEMICALS MAY BE AFFECTED BY COLOR   APPearance CLOUDY (A) CLEAR   Specific Gravity, Urine 1.017 1.005 -  1.030   pH 5.0 5.0 - 8.0   Glucose, UA NEGATIVE NEGATIVE mg/dL   Hgb urine dipstick NEGATIVE NEGATIVE   Bilirubin Urine NEGATIVE NEGATIVE   Ketones, ur NEGATIVE NEGATIVE mg/dL   Protein, ur NEGATIVE NEGATIVE mg/dL   Nitrite NEGATIVE NEGATIVE   Leukocytes,Ua MODERATE (A) NEGATIVE   RBC / HPF 0-5 0 - 5 RBC/hpf   WBC, UA >50 (H) 0 - 5 WBC/hpf   Bacteria, UA FEW (A) NONE SEEN   Squamous Epithelial / LPF 6-10 0 - 5   Mucus PRESENT    Hyaline Casts, UA PRESENT     Comment: Performed at [facility], [address] [place], [place] [address]  Urine culture     Status: Abnormal (Preliminary result)   Collection Time: 11/03/19  8:18 AM   Specimen: In/Out Cath Urine  Result Value Ref Range   Specimen Description      IN/OUT CATH URINE Performed at [facility], [address], [place], [place] [address]    Special Requests      NONE Performed at [facility], [address], [place], [place] [address]    Culture (A)     >=100,000 COLONIES/mL ESCHERICHIA COLI SUSCEPTIBILITIES TO FOLLOW Performed at [facility], [address], [place], [place] [address]    Report Status PENDING   Respiratory Panel by RT PCR (Flu A&B, Covid) - Nasopharyngeal Swab     Status: None   Collection Time: 11/03/19  9:13 AM   Specimen: Nasopharyngeal Swab  Result Value Ref Range   SARS Coronavirus 2 by RT PCR NEGATIVE NEGATIVE    Comment: (NOTE) SARS-CoV-2 target nucleic acids are NOT DETECTED.  The SARS-CoV-2 RNA is generally detectable in upper respiratoy specimens during the acute phase of infection. The lowest concentration of SARS-CoV-2 viral copies this assay can detect is 131 copies/mL. A negative result does not preclude SARS-Cov-2 infection and should not be used as the sole basis for treatment or other patient management decisions. A negative result may occur with  improper specimen collection/handling, submission of specimen  other than nasopharyngeal swab, presence of viral mutation(s) within the areas targeted by this assay, and inadequate number of viral copies (<131 copies/mL). A negative result must be combined with clinical observations, patient history, and epidemiological information. The expected result is Negative.  Fact Sheet for Patients:  https://www.moore.com/  Fact Sheet for Healthcare Providers:  https://www.young.biz/  This test is no t yet approved or cleared by the [place] FDA and  has been authorized for detection and/or diagnosis of SARS-CoV-2 by FDA under an Emergency Use Authorization (EUA). This EUA will remain  in effect (meaning this test can be used) for the duration of the COVID-19 declaration under Section 564(b)(1) of the Act, 21 U.S.C. section 360bbb-3(b)(1), unless the authorization is terminated or revoked sooner.     Influenza A by PCR NEGATIVE NEGATIVE   Influenza  B by PCR NEGATIVE NEGATIVE    Comment: (NOTE) The Xpert Xpress SARS-CoV-2/FLU/RSV assay is intended as an aid in  the diagnosis of influenza from Nasopharyngeal swab specimens and  should not be used as a sole basis for treatment. Nasal washings and  aspirates are unacceptable for Xpert Xpress SARS-CoV-2/FLU/RSV  testing.  Fact Sheet for Patients: https://www.moore.com/  Fact Sheet for Healthcare Providers: https://www.young.biz/  This test is not yet approved or cleared by the [place] FDA and  has been authorized for detection and/or diagnosis of SARS-CoV-2 by  FDA under an Emergency Use Authorization (EUA). This EUA will remain  in effect (meaning this test can be used) for the duration of the  Covid-19 declaration under Section 564(b)(1) of the Act, 21  U.S.C. section 360bbb-3(b)(1), unless the authorization is  terminated or revoked. Performed at [facility], [address], [place], [place] [address]   Lactic acid, plasma     Status: Abnormal   Collection Time: 11/03/19  9:49 AM  Result Value Ref Range   Lactic Acid, Venous 2.9 (HH) 0.5 - 1.9 mmol/L    Comment: CRITICAL RESULT CALLED TO, READ BACK BY AND VERIFIED WITHPrincella Ion RN AT 1056 11/03/19 MULLINS,T Performed at [facility], [address], [place], [place] [address]   Comprehensive metabolic panel     Status: Abnormal   Collection Time: 11/03/19  9:50 AM  Result Value Ref Range   Sodium 123 (L) 135 - 145 mmol/L   Potassium 4.1 3.5 - 5.1 mmol/L   Chloride 86 (L) 98 - 111 mmol/L   CO2 22 22 - 32 mmol/L   Glucose, Bld 92 70 - 99 mg/dL    Comment: Glucose reference range applies only to samples taken after fasting for at least 8 hours.   BUN 39 (H) 6 - 20 mg/dL   Creatinine, Ser 0.98 (H) 0.44 - 1.00 mg/dL   Calcium 8.0 (L) 8.9 - 10.3 mg/dL   Total Protein 7.5 6.5 - 8.1 g/dL   Albumin 2.2 (L) 3.5 - 5.0 g/dL   AST 84 (H) 15 - 41 U/L   ALT 36 0 - 44 U/L   Alkaline Phosphatase 133 (H) 38 - 126 U/L   Total Bilirubin 1.0 0.3 - 1.2 mg/dL   GFR, Estimated 56 (L) >60 mL/min    Comment: (NOTE) Calculated using the CKD-EPI Creatinine Equation (2021)    Anion gap 15 5 - 15    Comment: Performed at [facility], [address] [place], [place] [address]  CBC WITH DIFFERENTIAL     Status: Abnormal   Collection Time: 11/03/19  9:50 AM  Result Value Ref Range   WBC 10.7 (H) 4.0 - 10.5 K/uL    Comment: WHITE COUNT CONFIRMED ON SMEAR   RBC 4.49 3.87 - 5.11 MIL/uL   Hemoglobin 12.0 12.0 - 15.0 g/dL   HCT 78.2 (L) 36 - 46 %   MCV 79.3 (L) 80.0 - 100.0 fL   MCH 26.7 26.0 - 34.0 pg   MCHC 33.7 30.0 - 36.0 g/dL   RDW 95.6 21.3 - 08.6 %   Platelets 173 150 - 400 K/uL   nRBC 0.0 0.0 - 0.2 %   Neutrophils Relative % 87 %   Neutro Abs 9.3 (H) 1.7 - 7.7 K/uL   Lymphocytes Relative 5 %   Lymphs Abs 0.6 (L) 0.7 - 4.0 K/uL   Monocytes Relative 5 %   Monocytes Absolute  0.5 0.1 -  1.0 K/uL   Eosinophils Relative 0 %   Eosinophils Absolute 0.0 0.0 - 0.5 K/uL   Basophils Relative 1 %   Basophils Absolute 0.1 0.0 - 0.1 K/uL   WBC Morphology DOHLE BODIES     Comment: TOXIC GRANULATION   Immature Granulocytes 2 %   Abs Immature Granulocytes 0.18 (H) 0.00 - 0.07 K/uL    Comment: Performed at [facility], [address] [place], [place] [address]  Protime-INR     Status: Abnormal   Collection Time: 11/03/19  9:50 AM  Result Value Ref Range   Prothrombin Time 15.8 (H) 11.4 - 15.2 seconds   INR 1.3 (H) 0.8 - 1.2    Comment: (NOTE) INR goal varies based on device and disease states. Performed at [facility], [address] [place], [place] [address]   APTT     Status: None   Collection Time: 11/03/19  9:50 AM  Result Value Ref Range   aPTT 32 24 - 36 seconds    Comment: Performed at [facility], [address], [place], [place] [address]  Blood Culture (routine x 2)     Status: Abnormal (Preliminary result)   Collection Time: 11/03/19  9:50 AM   Specimen: BLOOD RIGHT HAND  Result Value Ref Range   Specimen Description      BLOOD RIGHT HAND Performed at [facility], [address], [place], [place] [address]    Special Requests      BOTTLES DRAWN AEROBIC ONLY Blood Culture adequate volume Performed at [facility], [address] [place], [place] [address]    Culture  Setup Time      AEROBIC BOTTLE ONLY GRAM POSITIVE COCCI CRITICAL VALUE NOTED.  VALUE IS CONSISTENT WITH PREVIOUSLY REPORTED AND CALLED VALUE. Performed at [facility], [address], [place], [place] [address]    Culture STAPHYLOCOCCUS AUREUS (A)    Report Status PENDING   Blood Culture (routine x 2)     Status: Abnormal (Preliminary result)   Collection Time: 11/03/19  9:50 AM   Specimen: BLOOD  Result Value Ref Range   Specimen Description      BLOOD LEFT ARM Performed  at [facility], [address], [place], [place] [address]    Special Requests      BOTTLES DRAWN AEROBIC AND ANAEROBIC Blood Culture adequate volume Performed at [facility], [address], [place], [place] [address]    Culture  Setup Time      IN BOTH AEROBIC AND ANAEROBIC BOTTLES GRAM POSITIVE COCCI CRITICAL VALUE NOTED.  VALUE IS CONSISTENT WITH PREVIOUSLY REPORTED AND CALLED VALUE. Performed at [facility], [address], [place], [place] [address]    Culture STAPHYLOCOCCUS AUREUS (A)    Report Status PENDING   Renal function panel     Status: Abnormal   Collection Time: 11/03/19  9:50 AM  Result Value Ref Range   Sodium 128 (L) 135 - 145 mmol/L   Potassium 4.2 3.5 - 5.1 mmol/L   Chloride 89 (L) 98 - 111 mmol/L   CO2 22 22 - 32 mmol/L   Glucose, Bld 88 70 - 99 mg/dL    Comment: Glucose reference range applies only to samples taken after fasting for at least 8 hours.   BUN 39 (H) 6 - 20 mg/dL   Creatinine, Ser 2.44 (H) 0.44 - 1.00 mg/dL   Calcium 8.2 (L) 8.9 -  10.3 mg/dL   Phosphorus 4.2 2.5 - 4.6 mg/dL   Albumin 2.2 (L) 3.5 - 5.0 g/dL   GFR, Estimated 56 (L) >60 mL/min    Comment: (NOTE) Calculated using the CKD-EPI Creatinine Equation (2021)    Anion gap 17 (H) 5 - 15    Comment: Performed at [facility], [address], [place], [place] [address]  Culture, blood (Routine X 2) w Reflex to ID Panel     Status: Abnormal (Preliminary result)   Collection Time: 11/03/19  9:52 AM   Specimen: BLOOD  Result Value Ref Range   Specimen Description      BLOOD RIGHT ARM Performed at [facility], [address] [place], [place] [address]    Special Requests      BOTTLES DRAWN AEROBIC ONLY Blood Culture adequate volume Performed at [facility] [address], [place], [place] [address]    Culture  Setup Time      AEROBIC BOTTLE ONLY GRAM POSITIVE COCCI Organism ID to  follow CRITICAL RESULT CALLED TO, READ BACK BY AND VERIFIED WITH:  PHARMD E JACKSON 11/03/19 AT 1157 SK    Culture (A)     STAPHYLOCOCCUS AUREUS SUSCEPTIBILITIES TO FOLLOW Performed at [facility], [address], [place], [place] [address]    Report Status PENDING   Blood Culture ID Panel (Reflexed)     Status: Abnormal   Collection Time: 11/03/19  9:52 AM  Result Value Ref Range   Enterococcus faecalis NOT DETECTED NOT DETECTED   Enterococcus Faecium NOT DETECTED NOT DETECTED   Listeria monocytogenes NOT DETECTED NOT DETECTED   Staphylococcus species DETECTED (A) NOT DETECTED    Comment: CRITICAL RESULT CALLED TO, READ BACK BY AND VERIFIED WITH: PHARMD E JACKSON 11/03/19 AT 2357 SK    Staphylococcus aureus (BCID) DETECTED (A) NOT DETECTED    Comment: Methicillin (oxacillin)-resistant Staphylococcus aureus (MRSA). MRSA is predictably resistant to beta-lactam antibiotics (except ceftaroline). Preferred therapy is vancomycin unless clinically contraindicated. Patient requires contact precautions if  hospitalized. CRITICAL RESULT CALLED TO, READ BACK BY AND VERIFIED WITH: PHARMD E JACKSON 11/03/19 AT 2357 SK    Staphylococcus epidermidis NOT DETECTED NOT DETECTED   Staphylococcus lugdunensis NOT DETECTED NOT DETECTED   Streptococcus species NOT DETECTED NOT DETECTED   Streptococcus agalactiae NOT DETECTED NOT DETECTED   Streptococcus pneumoniae NOT DETECTED NOT DETECTED   Streptococcus pyogenes NOT DETECTED NOT DETECTED   A.calcoaceticus-baumannii NOT DETECTED NOT DETECTED   Bacteroides fragilis NOT DETECTED NOT DETECTED   Enterobacterales NOT DETECTED NOT DETECTED   Enterobacter cloacae complex NOT DETECTED NOT DETECTED   Escherichia coli NOT DETECTED NOT DETECTED   Klebsiella aerogenes NOT DETECTED NOT DETECTED   Klebsiella oxytoca NOT DETECTED NOT DETECTED   Klebsiella pneumoniae NOT DETECTED NOT DETECTED   Proteus species NOT DETECTED NOT DETECTED   Salmonella species  NOT DETECTED NOT DETECTED   Serratia marcescens NOT DETECTED NOT DETECTED   Haemophilus influenzae NOT DETECTED NOT DETECTED   Neisseria meningitidis NOT DETECTED NOT DETECTED   Pseudomonas aeruginosa NOT DETECTED NOT DETECTED   Stenotrophomonas maltophilia NOT DETECTED NOT DETECTED   Candida albicans NOT DETECTED NOT DETECTED   Candida auris NOT DETECTED NOT DETECTED   Candida glabrata NOT DETECTED NOT DETECTED   Candida krusei NOT DETECTED NOT DETECTED   Candida parapsilosis NOT DETECTED NOT DETECTED   Candida tropicalis NOT DETECTED NOT DETECTED   Cryptococcus neoformans/gattii NOT DETECTED NOT DETECTED   Meth resistant mecA/C  and MREJ DETECTED (A) NOT DETECTED    Comment: CRITICAL RESULT CALLED TO, READ BACK BY AND VERIFIED WITH: PHARMD E JACKSON 11/03/19 AT 2357 SK Performed at [facility], [address], [place], [place] [address]   I-Stat beta hCG blood, ED     Status: None   Collection Time: 11/03/19 10:01 AM  Result Value Ref Range   I-stat hCG, quantitative <5.0 <5 mIU/mL   Comment 3            Comment:   GEST. AGE      CONC.  (mIU/mL)   <=1 WEEK        5 - 50     2 WEEKS       50 - 500     3 WEEKS       100 - 10,000     4 WEEKS     1,000 - 30,000        FEMALE AND NON-PREGNANT FEMALE:     LESS THAN 5 mIU/mL   Lactic acid, plasma     Status: None   Collection Time: 11/03/19 12:29 PM  Result Value Ref Range   Lactic Acid, Venous 1.7 0.5 - 1.9 mmol/L    Comment: Performed at [facility], [address], [place], [place] [address]  Lactic acid, plasma     Status: None   Collection Time: 11/03/19  9:34 PM  Result Value Ref Range   Lactic Acid, Venous 1.6 0.5 - 1.9 mmol/L    Comment: Performed at [facility], [address], [place], [place] [address]  HIV Antibody (routine testing w rflx)     Status: None   Collection Time: 11/03/19 10:40 PM  Result Value Ref Range   HIV Screen 4th Generation wRfx Non Reactive Non  Reactive    Comment: Performed at [facility], [address], [place], [place] [address]  Renal function panel     Status: Abnormal   Collection Time: 11/03/19 10:40 PM  Result Value Ref Range   Sodium 131 (L) 135 - 145 mmol/L   Potassium 3.8 3.5 - 5.1 mmol/L   Chloride 95 (L) 98 - 111 mmol/L   CO2 25 22 - 32 mmol/L   Glucose, Bld 105 (H) 70 - 99 mg/dL    Comment: Glucose reference range applies only to samples taken after fasting for at least 8 hours.   BUN 30 (H) 6 - 20 mg/dL   Creatinine, Ser 7.32 0.44 - 1.00 mg/dL   Calcium 7.9 (L) 8.9 - 10.3 mg/dL   Phosphorus 3.6 2.5 - 4.6 mg/dL   Albumin 1.8 (L) 3.5 - 5.0 g/dL   GFR, Estimated >20 >25 mL/min    Comment: (NOTE) Calculated using the CKD-EPI Creatinine Equation (2021)    Anion gap 11 5 - 15    Comment: Performed at [facility], [address], [place], [place] [address]  CK     Status: None   Collection Time: 11/03/19 10:40 PM  Result Value Ref Range   Total CK 90 38.0 - 234.0 U/L    Comment: Performed at [facility], [address], [place], [place] [address]  Troponin I (High Sensitivity)     Status: None   Collection Time: 11/03/19 10:40 PM  Result Value Ref Range   Troponin I (High Sensitivity) 14 <18 ng/L    Comment: (NOTE) Elevated high sensitivity troponin I (hsTnI) values and significant  changes across  serial measurements may suggest ACS but many other  chronic and acute conditions are known to elevate hsTnI results.  Refer to the "Links" section for chest pain algorithms and additional  guidance. Performed at [facility], [address] [place], [place] [address]   Lactic acid, plasma     Status: None   Collection Time: 11/03/19 10:40 PM  Result Value Ref Range   Lactic Acid, Venous 1.7 0.5 - 1.9 mmol/L    Comment: Performed at [facility], [address] [place], [place] [address]  TSH     Status: None   Collection  Time: 11/03/19 10:40 PM  Result Value Ref Range   TSH 0.402 0.350 - 4.500 uIU/mL    Comment: Performed by a 3rd Generation assay with a functional sensitivity of <=0.01 uIU/mL. Performed at [facility], [address] [place], [place] [address]   Renal function panel     Status: Abnormal   Collection Time: 11/04/19  4:34 AM  Result Value Ref Range   Sodium 129 (L) 135 - 145 mmol/L   Potassium 3.5 3.5 - 5.1 mmol/L   Chloride 96 (L) 98 - 111 mmol/L   CO2 24 22 - 32 mmol/L   Glucose, Bld 134 (H) 70 - 99 mg/dL    Comment: Glucose reference range applies only to samples taken after fasting for at least 8 hours.   BUN 22 (H) 6 - 20 mg/dL   Creatinine, Ser 9.14 0.44 - 1.00 mg/dL   Calcium 7.5 (L) 8.9 - 10.3 mg/dL   Phosphorus 2.6 2.5 - 4.6 mg/dL   Albumin 1.5 (L) 3.5 - 5.0 g/dL   GFR, Estimated >78 >29 mL/min    Comment: (NOTE) Calculated using the CKD-EPI Creatinine Equation (2021)    Anion gap 9 5 - 15    Comment: Performed at [facility], [address] [place], [place] [address]  Protime-INR     Status: Abnormal   Collection Time: 11/04/19  4:34 AM  Result Value Ref Range   Prothrombin Time 18.0 (H) 11.4 - 15.2 seconds   INR 1.5 (H) 0.8 - 1.2    Comment: (NOTE) INR goal varies based on device and disease states. Performed at [facility], [address], [place], [place] [address]   Cortisol-am, blood     Status: None   Collection Time: 11/04/19  4:34 AM  Result Value Ref Range   Cortisol - AM 14.9 6.7 - 22.6 ug/dL    Comment: Performed at [facility], [address], [place], [place] [address]  Procalcitonin     Status: None   Collection Time: 11/04/19  4:34 AM  Result Value Ref Range   Procalcitonin 12.02 ng/mL    Comment:        Interpretation: PCT >= 10 ng/mL: Important systemic inflammatory response, almost exclusively due to severe bacterial sepsis or septic shock. (NOTE)       Sepsis PCT  Algorithm           Lower Respiratory Tract                                      Infection PCT Algorithm    ----------------------------     ----------------------------         PCT < 0.25 ng/mL  PCT < 0.10 ng/mL          Strongly encourage             Strongly discourage   discontinuation of antibiotics    initiation of antibiotics    ----------------------------     -----------------------------       PCT 0.25 - 0.50 ng/mL            PCT 0.10 - 0.25 ng/mL               OR       >80% decrease in PCT            Discourage initiation of                                            antibiotics      Encourage discontinuation           of antibiotics    ----------------------------     -----------------------------         PCT >= 0.50 ng/mL              PCT 0.26 - 0.50 ng/mL                AND       <80% decrease in PCT             Encourage initiation of                                             antibiotics       Encourage continuation           of antibiotics    ----------------------------     -----------------------------        PCT >= 0.50 ng/mL                  PCT > 0.50 ng/mL               AND         increase in PCT                  Strongly encourage                                      initiation of antibiotics    Strongly encourage escalation           of antibiotics                                     -----------------------------                                           PCT <= 0.25 ng/mL                                                 OR                                        >  80% decrease in PCT                                      Discontinue / Do not initiate                                             antibiotics  Performed at [facility], [address] [place], [place] [address]   CBC     Status: Abnormal   Collection Time: 11/04/19  4:34 AM  Result Value Ref Range   WBC 10.3 4.0 - 10.5 K/uL   RBC 3.48 (L) 3.87 - 5.11  MIL/uL   Hemoglobin 9.3 (L) 12.0 - 15.0 g/dL   HCT 95.127.9 (L) 36 - 46 %   MCV 80.2 80.0 - 100.0 fL   MCH 26.7 26.0 - 34.0 pg   MCHC 33.3 30.0 - 36.0 g/dL   RDW 88.414.3 16.611.5 - 06.315.5 %   Platelets 193 150 - 400 K/uL   nRBC 0.0 0.0 - 0.2 %    Comment: Performed at [facility], [address] [place], [place] [address]  Hemoglobin A1c     Status: Abnormal   Collection Time: 11/04/19  4:34 AM  Result Value Ref Range   Hgb A1c MFr Bld 6.3 (H) 4.8 - 5.6 %    Comment: (NOTE)         Prediabetes: 5.7 - 6.4         Diabetes: >6.4         Glycemic control for adults with diabetes: <7.0    Mean Plasma Glucose 134 mg/dL    Comment: (NOTE) Performed At: [facility] [address] [place], [place] [id number] Jolene SchimkeNagendra Sanjai MD UK:[id number]:[phone]   Hepatic function panel     Status: Abnormal   Collection Time: 11/04/19  3:50 PM  Result Value Ref Range   Total Protein 6.0 (L) 6.5 - 8.1 g/dL   Albumin 1.7 (L) 3.5 - 5.0 g/dL   AST 29 15 - 41 U/L   ALT 23 0 - 44 U/L   Alkaline Phosphatase 103 38 - 126 U/L   Total Bilirubin 0.8 0.3 - 1.2 mg/dL   Bilirubin, Direct 0.2 0.0 - 0.2 mg/dL   Indirect Bilirubin 0.6 0.3 - 0.9 mg/dL    Comment: Performed at [facility], [address] [place], [place] [address]  Hepatitis panel, acute     Status: Abnormal   Collection Time: 11/04/19  3:50 PM  Result Value Ref Range   Hepatitis B Surface Ag NON REACTIVE NON REACTIVE   HCV Ab Reactive (A) NON REACTIVE    Comment: (NOTE) The CDC recommends that a Reactive HCV antibody result be followed up  with a HCV Nucleic Acid Amplification test.     Hep A IgM NON REACTIVE NON REACTIVE   Hep B C IgM NON REACTIVE NON REACTIVE    Comment: Performed at [facility], [address] [place], [place] [address]    CT Angio Chest PE W and/or Wo Contrast  Result Date: 11/03/2019 CLINICAL DATA:  Hypotensive, heroin user EXAM: CT ANGIOGRAPHY CHEST WITH CONTRAST TECHNIQUE:  Multidetector CT imaging of the chest was performed using the standard protocol during bolus administration of intravenous contrast. Multiplanar CT image reconstructions and  MIPs were obtained to evaluate the vascular anatomy. CONTRAST:  OMNIPAQUE IOHEXOL 350 MG/ML SOLN COMPARISON:  None. FINDINGS: Cardiovascular: Satisfactory opacification of the pulmonary arteries to the segmental level. No evidence of pulmonary embolism. Normal heart size. No pericardial effusion. Thoracic aorta is normal in caliber. Mediastinum/Nodes: Mildly enlarged mediastinal and hilar lymph nodes are likely reactive. Thyroid and esophagus are unremarkable. Lungs/Pleura: Numerous nodules are present bilaterally with some demonstrating internal cavitation. Mild interlobular septal thickening. Mild bibasilar atelectasis. No pleural effusion or pneumothorax. Upper Abdomen: No acute abnormality. Musculoskeletal: Osseous structures are unremarkable. Review of the MIP images confirms the above findings. IMPRESSION: No acute pulmonary embolism. Pulmonary findings most consistent with septic emboli. Minor interstitial pulmonary edema. These results were called by telephone at the time of interpretation on 11/03/2019 at 11:56 am to provider ADAM CURATOLO , who verbally acknowledged these results. Electronically Signed   By: Guadlupe Spanish M.D.   On: 11/03/2019 12:01   MR BRAIN WO CONTRAST  Result Date: 11/04/2019 CLINICAL DATA:  Bacteremia.  Rule out septic emboli. EXAM: MRI HEAD WITHOUT CONTRAST TECHNIQUE: Multiplanar, multiecho pulse sequences of the brain and surrounding structures were obtained without intravenous contrast. COMPARISON:  Head CT January 18, 2018. FINDINGS: Brain: No acute infarction, hemorrhage, hydrocephalus, extra-axial collection or mass lesion. The brain parenchyma has normal morphology and signal characteristics. Vascular: Normal flow voids. Skull and upper cervical spine: Diffuse decrease of the T1 signal within the  visualized upper cervical spine, calvarium and mandible may represent red marrow reconversion. Correlate with CBC. Sinuses/Orbits: Minimal mucosal thickening of the right maxillary sinus. The orbits are maintained. Other: None. IMPRESSION: 1. No acute intracranial abnormality. Unremarkable MRI of the brain. 2. Diffuse decrease of the T1 signal within the visualized upper cervical spine, calvarium and mandible may represent red marrow reconversion. Correlate with CBC. Electronically Signed   By: Baldemar Lenis M.D.   On: 11/04/2019 20:52   MR THORACIC SPINE WO CONTRAST  Result Date: 11/04/2019 CLINICAL DATA:  Bacteremia.  Back pain. EXAM: MRI THORACIC AND LUMBAR SPINE WITHOUT CONTRAST TECHNIQUE: Multiplanar and multiecho pulse sequences of the thoracic and lumbar spine were obtained without intravenous contrast. The patient refused contrast administration. COMPARISON:  Chest CT yesterday. FINDINGS: MRI THORACIC SPINE FINDINGS Alignment:  Normal Vertebrae: Normal Cord:  Normal Paraspinal and other soft tissues: Small effusions layering dependently, right more than left, with dependent atelectasis in the lower lobes right more than left. Patchy infiltrates as were shown by CT. Disc levels: No degenerative disc disease. No stenosis of the canal or foramina. No evidence of discitis osteomyelitis or infected facet joint. MRI LUMBAR SPINE FINDINGS Segmentation:  5 lumbar type vertebral bodies. Alignment:  Normal Vertebrae:  Vertebral bodies are normal. Conus medullaris: Extends to the L1-2 level and appears normal. Paraspinal and other soft tissues: Negative Disc levels: No evidence of degenerative disc disease or disc space infection. Patient has some edema associated with the facet joints at L4-5, without frank joint effusions. This could be degenerative in nature or could reflect the earliest changes of facet joint infection. IMPRESSION: 1. No evidence of discitis osteomyelitis or infected facet  joint in the thoracic region. 2. No evidence of degenerative disc disease or disc space infection in the lumbar region. There is some edema associated with the facet joints at L4-5, without frank joint effusions. This could be degenerative in nature or could reflect the earliest changes of facet joint infection. 3. Small pleural effusions layering dependently, right more than left, with  dependent atelectasis in the lower lobes right more than left. Focal pulmonary infiltrates as shown by CT yesterday. Electronically Signed   By: Paulina Fusi M.D.   On: 11/04/2019 15:34   MR LUMBAR SPINE WO CONTRAST  Result Date: 11/04/2019 CLINICAL DATA:  Bacteremia.  Back pain. EXAM: MRI THORACIC AND LUMBAR SPINE WITHOUT CONTRAST TECHNIQUE: Multiplanar and multiecho pulse sequences of the thoracic and lumbar spine were obtained without intravenous contrast. The patient refused contrast administration. COMPARISON:  Chest CT yesterday. FINDINGS: MRI THORACIC SPINE FINDINGS Alignment:  Normal Vertebrae: Normal Cord:  Normal Paraspinal and other soft tissues: Small effusions layering dependently, right more than left, with dependent atelectasis in the lower lobes right more than left. Patchy infiltrates as were shown by CT. Disc levels: No degenerative disc disease. No stenosis of the canal or foramina. No evidence of discitis osteomyelitis or infected facet joint. MRI LUMBAR SPINE FINDINGS Segmentation:  5 lumbar type vertebral bodies. Alignment:  Normal Vertebrae:  Vertebral bodies are normal. Conus medullaris: Extends to the L1-2 level and appears normal. Paraspinal and other soft tissues: Negative Disc levels: No evidence of degenerative disc disease or disc space infection. Patient has some edema associated with the facet joints at L4-5, without frank joint effusions. This could be degenerative in nature or could reflect the earliest changes of facet joint infection. IMPRESSION: 1. No evidence of discitis osteomyelitis or  infected facet joint in the thoracic region. 2. No evidence of degenerative disc disease or disc space infection in the lumbar region. There is some edema associated with the facet joints at L4-5, without frank joint effusions. This could be degenerative in nature or could reflect the earliest changes of facet joint infection. 3. Small pleural effusions layering dependently, right more than left, with dependent atelectasis in the lower lobes right more than left. Focal pulmonary infiltrates as shown by CT yesterday. Electronically Signed   By: Paulina Fusi M.D.   On: 11/04/2019 15:34   CT ANKLE RIGHT W CONTRAST  Result Date: 11/04/2019 CLINICAL DATA:  Foot swelling bacteremia EXAM: CT OF THE RIGHT ANKLE WITH CONTRAST TECHNIQUE: Multidetector CT imaging of the right ankle was performed following the standard protocol during bolus administration of intravenous contrast. CONTRAST:  OMNIPAQUE IOHEXOL 300 MG/ML  SOLN COMPARISON:  None. FINDINGS: Bones/Joint/Cartilage No fracture or dislocation. No areas of cortical destruction or periosteal reaction are seen. The articular surfaces appear maintained. No large ankle joint effusion. Ligaments Suboptimally assessed by CT. Muscles and Tendons The muscles surrounding the ankle and foot appear to be intact without focal atrophy or tear. The flexor and extensor tendons are intact. The Achilles tendon is intact. Soft tissues: There is subcutaneous edema seen along the posterior heel pad with mild skin thickening. There also appears to be mildly heterogeneous density with thickening in the middle cord of the plantar fascia, however it is intact. No loculated fluid collections are noted. IMPRESSION: Findings which could be suggestive of diffuse cellulitis along the heel pad. No loculated fluid collections or definite evidence of osteomyelitis. Electronically Signed   By: Jonna Clark M.D.   On: 11/04/2019 21:45   CT FOOT LEFT W CONTRAST  Result Date:  11/04/2019 CLINICAL DATA:  Foot swelling bacteremia EXAM: CT OF THE RIGHT ANKLE WITH CONTRAST TECHNIQUE: Multidetector CT imaging of the right ankle was performed following the standard protocol during bolus administration of intravenous contrast. CONTRAST:  OMNIPAQUE IOHEXOL 300 MG/ML  SOLN COMPARISON:  None. FINDINGS: Bones/Joint/Cartilage No fracture or dislocation. No areas  of cortical destruction or periosteal reaction are seen. The articular surfaces appear maintained. No large ankle joint effusion. Ligaments Suboptimally assessed by CT. Muscles and Tendons The muscles surrounding the ankle and foot appear to be intact without focal atrophy or tear. The flexor and extensor tendons are intact. The Achilles tendon is intact. Soft tissues: There is subcutaneous edema seen along the posterior heel pad with mild skin thickening. There also appears to be mildly heterogeneous density with thickening in the middle cord of the plantar fascia, however it is intact. No loculated fluid collections are noted. IMPRESSION: Findings which could be suggestive of diffuse cellulitis along the heel pad. No loculated fluid collections or definite evidence of osteomyelitis. Electronically Signed   By: Jonna Clark M.D.   On: 11/04/2019 21:45   DG Chest Port 1 View  Result Date: 11/03/2019 CLINICAL DATA:  Sepsis, IV drug use EXAM: PORTABLE CHEST 1 VIEW COMPARISON:  09/07/2014 FINDINGS: Since the prior examination, there have developed multiple peripherally distributed pulmonary nodules within the lungs bilaterally. Given the patient's history of drug use, septic embolization should be considered. Atypical infection, such as fungal pneumonia, or metastatic disease are considered less likely, but could present similarly. No confluent pulmonary infiltrates. Cardiac size within normal limits. Pulmonary vascularity is normal. No acute bone abnormality. IMPRESSION: Interval development of multiple pulmonary nodules. This could  be better assessed with CT examination, if indicated. Electronically Signed   By: Helyn Numbers MD   On: 11/03/2019 08:58   ECHOCARDIOGRAM COMPLETE  Result Date: 11/04/2019    ECHOCARDIOGRAM REPORT   Patient Name:   Amber [name] Date of Exam: 11/04/2019 Medical Rec #:  [id number]        Height:       62.0 in Accession #:    [id number]       Weight:       105.0 lb Date of Birth:  1990-06-11         BSA:          1.454 m Patient Age:    29 years         BP:           95/58 mmHg Patient Gender: F                HR:           97 bpm. Exam Location:  Inpatient Procedure: 2D Echo Indications:    Bacteremia  History:        Patient has no prior history of Echocardiogram examinations.                 Risk Factors:IV Drug abuse.  Sonographer:    Thurman Coyer RDCS (AE) Referring Phys: [id number] Teddy Spike IMPRESSIONS  1. Left ventricular ejection fraction, by estimation, is 60 to 65%. The left ventricle has normal function. The left ventricle has no regional wall motion abnormalities. Left ventricular diastolic parameters were normal.  2. Right ventricular systolic function There is an ill defined mass encompassing a large portion of the RV cavity. Recommend cardiac MRI for further evaulation.. The right ventricular size is normal. There is normal pulmonary artery systolic pressure. The estimated right ventricular systolic pressure is 18.4 mmHg.  3. The mitral valve is normal in structure. No evidence of mitral valve regurgitation. No evidence of mitral stenosis.  4. Possible vegetation on TV leaflet. Consider TEE for further evaulation. . The tricuspid valve is abnormal.  5. The aortic valve was not well visualized.  Aortic valve regurgitation is not visualized. No aortic stenosis is present.  6. The inferior vena cava is normal in size with greater than 50% respiratory variability, suggesting right atrial pressure of 3 mmHg.Recommend Cardiac MRI to evaluated abnormality in RV cavity. ALso ecoh concerning for TV  endocardidits. FINDINGS  Left Ventricle: Left ventricular ejection fraction, by estimation, is 60 to 65%. The left ventricle has normal function. The left ventricle has no regional wall motion abnormalities. The left ventricular internal cavity size was normal in size. There is  no left ventricular hypertrophy. Left ventricular diastolic parameters were normal. Normal left ventricular filling pressure. Right Ventricle: The right ventricular size is normal. No increase in right ventricular wall thickness. Right ventricular systolic function There is an ill defined mass encompassing a large portion of the RV cavity. Recommend cardiac MRI for further evaulation. There is normal pulmonary artery systolic pressure. The tricuspid regurgitant velocity is 1.96 m/s, and with an assumed right atrial pressure of 3 mmHg, the estimated right ventricular systolic pressure is 18.4 mmHg. Left Atrium: Left atrial size was normal in size. Right Atrium: Right atrial size was normal in size. Pericardium: There is no evidence of pericardial effusion. Mitral Valve: The mitral valve is normal in structure. No evidence of mitral valve regurgitation. No evidence of mitral valve stenosis. Tricuspid Valve: Possible vegetation on TV leaflet. Consider TEE for further evaulation. The tricuspid valve is abnormal. Tricuspid valve regurgitation is mild . No evidence of tricuspid stenosis. Aortic Valve: The aortic valve was not well visualized. Aortic valve regurgitation is not visualized. No aortic stenosis is present. Pulmonic Valve: The pulmonic valve was normal in structure. Pulmonic valve regurgitation is not visualized. No evidence of pulmonic stenosis. Aorta: The aortic root is normal in size and structure. Venous: The inferior vena cava is normal in size with greater than 50% respiratory variability, suggesting right atrial pressure of 3 mmHg. IAS/Shunts: No atrial level shunt detected by color flow Doppler.  LEFT VENTRICLE PLAX 2D LVIDd:          3.59 cm  Diastology LVIDs:         2.31 cm  LV e' medial:    9.57 cm/s LV PW:         0.61 cm  LV E/e' medial:  7.8 LV IVS:        0.80 cm  LV e' lateral:   8.70 cm/s LVOT diam:     2.10 cm  LV E/e' lateral: 8.5 LV SV:         55 LV SV Index:   38 LVOT Area:     3.46 cm  RIGHT VENTRICLE RV S prime:     12.70 cm/s TAPSE (M-mode): 2.0 cm LEFT ATRIUM             Index       RIGHT ATRIUM           Index LA diam:        1.60 cm 1.10 cm/m  RA Area:     13.70 cm LA Vol (A2C):   31.6 ml 21.74 ml/m RA Volume:   32.10 ml  22.08 ml/m LA Vol (A4C):   20.8 ml 14.31 ml/m LA Biplane Vol: 26.7 ml 18.37 ml/m  AORTIC VALVE LVOT Vmax:   90.90 cm/s LVOT Vmean:  55.500 cm/s LVOT VTI:    0.158 m  AORTA Ao Root diam: 2.70 cm MITRAL VALVE               TRICUSPID  VALVE MV Area (PHT): 3.37 cm    TR Peak grad:   15.4 mmHg MV Decel Time: 225 msec    TR Vmax:        196.00 cm/s MV E velocity: 74.20 cm/s MV A velocity: 60.60 cm/s  SHUNTS MV E/A ratio:  1.22        Systemic VTI:  0.16 m                            Systemic Diam: 2.10 cm Armanda Magic MD Electronically signed by Armanda Magic MD Signature Date/Time: 11/04/2019/2:03:31 PM    Final     Review of Systems  Unable to perform ROS: Patient nonverbal   Blood pressure 115/77, pulse 100, temperature 98.9 F (37.2 C), temperature source Axillary, resp. rate (!) 30, height 5\' 2"  (1.575 m), weight 50.4 kg, SpO2 90 %. Physical Exam Constitutional:      Appearance: She is ill-appearing.  Cardiovascular:     Rate and Rhythm: Normal rate and regular rhythm.     Pulses: Normal pulses.     Heart sounds: No murmur heard.   Pulmonary:     Breath sounds: Normal breath sounds.  Neurological:     Comments: Awake but nonverbal.  Moves all extremities to command    ECHOCARDIOGRAM REPORT       Patient Name:  Amber [name] Date of Exam: 11/04/2019  Medical Rec #: [id number]    Height:    62.0 in  Accession #:  [id number]    Weight:    105.0 lb  Date  of Birth: December 08, 1990     BSA:     1.454 m  Patient Age:  29 years     BP:      95/58 mmHg  Patient Gender: F        HR:      97 bpm.  Exam Location: Inpatient   Procedure: 2D Echo   Indications:  Bacteremia    History:    Patient has no prior history of Echocardiogram  examinations.         Risk Factors:IV Drug abuse.    Sonographer:  Thurman Coyer RDCS (AE)  Referring Phys: [id number] Teddy Spike   IMPRESSIONS    1. Left ventricular ejection fraction, by estimation, is 60 to 65%. The  left ventricle has normal function. The left ventricle has no regional  wall motion abnormalities. Left ventricular diastolic parameters were  normal.  2. Right ventricular systolic function There is an ill defined mass  encompassing a large portion of the RV cavity. Recommend cardiac MRI for  further evaulation.. The right ventricular size is normal. There is normal  pulmonary artery systolic pressure.  The estimated right ventricular systolic pressure is 18.4 mmHg.  3. The mitral valve is normal in structure. No evidence of mitral valve  regurgitation. No evidence of mitral stenosis.  4. Possible vegetation on TV leaflet. Consider TEE for further  evaulation. . The tricuspid valve is abnormal.  5. The aortic valve was not well visualized. Aortic valve regurgitation  is not visualized. No aortic stenosis is present.  6. The inferior vena cava is normal in size with greater than 50%  respiratory variability, suggesting right atrial pressure of 3  mmHg.Recommend Cardiac MRI to evaluated abnormality in RV cavity. ALso  ecoh concerning for TV endocardidits.   FINDINGS  Left Ventricle: Left ventricular ejection fraction, by estimation, is 60  to 65%. The left  ventricle has normal function. The left ventricle has no  regional wall motion abnormalities. The left ventricular internal cavity  size was normal in size. There is  no left  ventricular hypertrophy. Left ventricular diastolic parameters  were normal. Normal left ventricular filling pressure.   Right Ventricle: The right ventricular size is normal. No increase in  right ventricular wall thickness. Right ventricular systolic function  There is an ill defined mass encompassing a large portion of the RV  cavity. Recommend cardiac MRI for further  evaulation. There is normal pulmonary artery systolic pressure. The  tricuspid regurgitant velocity is 1.96 m/s, and with an assumed right  atrial pressure of 3 mmHg, the estimated right ventricular systolic  pressure is 18.4 mmHg.   Left Atrium: Left atrial size was normal in size.   Right Atrium: Right atrial size was normal in size.   Pericardium: There is no evidence of pericardial effusion.   Mitral Valve: The mitral valve is normal in structure. No evidence of  mitral valve regurgitation. No evidence of mitral valve stenosis.   Tricuspid Valve: Possible vegetation on TV leaflet. Consider TEE for  further evaulation. The tricuspid valve is abnormal. Tricuspid valve  regurgitation is mild . No evidence of tricuspid stenosis.   Aortic Valve: The aortic valve was not well visualized. Aortic valve  regurgitation is not visualized. No aortic stenosis is present.   Pulmonic Valve: The pulmonic valve was normal in structure. Pulmonic valve  regurgitation is not visualized. No evidence of pulmonic stenosis.   Aorta: The aortic root is normal in size and structure.   Venous: The inferior vena cava is normal in size with greater than 50%  respiratory variability, suggesting right atrial pressure of 3 mmHg.   IAS/Shunts: No atrial level shunt detected by color flow Doppler.     LEFT VENTRICLE  PLAX 2D  LVIDd:     3.59 cm Diastology  LVIDs:     2.31 cm LV e' medial:  9.57 cm/s  LV PW:     0.61 cm LV E/e' medial: 7.8  LV IVS:    0.80 cm LV e' lateral:  8.70 cm/s  LVOT diam:   2.10 cm  LV E/e' lateral: 8.5  LV SV:     55  LV SV Index:  38  LVOT Area:   3.46 cm     RIGHT VENTRICLE  RV S prime:   12.70 cm/s  TAPSE (M-mode): 2.0 cm   LEFT ATRIUM       Index    RIGHT ATRIUM      Index  LA diam:    1.60 cm 1.10 cm/m RA Area:   13.70 cm  LA Vol (A2C):  31.6 ml 21.74 ml/m RA Volume:  32.10 ml 22.08 ml/m  LA Vol (A4C):  20.8 ml 14.31 ml/m  LA Biplane Vol: 26.7 ml 18.37 ml/m  AORTIC VALVE  LVOT Vmax:  90.90 cm/s  LVOT Vmean: 55.500 cm/s  LVOT VTI:  0.158 m    AORTA  Ao Root diam: 2.70 cm   MITRAL VALVE        TRICUSPID VALVE  MV Area (PHT): 3.37 cm  TR Peak grad:  15.4 mmHg  MV Decel Time: 225 msec  TR Vmax:    196.00 cm/s  MV E velocity: 74.20 cm/s  MV A velocity: 60.60 cm/s SHUNTS  MV E/A ratio: 1.22    Systemic VTI: 0.16 m  Systemic Diam: 2.10 cm   Armanda Magic MD  Electronically signed by Armanda Magic MD  Signature Date/Time: 11/04/2019/2:03:31 PM     Assessment/Plan:  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  She has extensive bilateral septic pulmonary emboli.  The tricuspid valve itself appears to be working well and there does not appear to be significant destruction of the valve.  She does need a transesophageal echocardiogram to evaluate the cardiac valves and right ventricular mass in more detail before making a decision about surgical treatment.  After that is complete we will discuss her case and make a decision about whether this should be treated with open surgical treatment or Angio Vac or medical therapy alone.  In the meantime we will continue intravenous antibiotics and consider intravenous heparin in case the RV mass is thrombus on top of vegetation.  She would be a poor candidate for open surgical  treatment given her degree of acute on chronic debilitation and severe malnutrition with an albumin of 1.7.  I discussed the above with her mother who is at the bedside.  Payton Doughty Amatullah Christy 11/05/2019, 8:16 AM

## 2019-11-05 NOTE — Progress Notes (Signed)
   11/04/19 2325  Assess: MEWS Score  Temp (!) 100.5 F (38.1 C)  BP 107/68  Pulse Rate (!) 109  Resp (!) 28  Level of Consciousness Alert  SpO2 100 %  O2 Device Room Air  Assess: MEWS Score  MEWS Temp 1  MEWS Systolic 0  MEWS Pulse 1  MEWS RR 2  MEWS LOC 0  MEWS Score 4  MEWS Score Color Red  Assess: if the MEWS score is Yellow or Red  Were vital signs taken at a resting state? Yes  Focused Assessment No change from prior assessment (Pt has chronic Red MEWs, from [employer])  Early Detection of Sepsis Score *See Row Information* High  MEWS guidelines implemented *See Row Information* No, previously red, continue vital signs every 4 hours  Treat  MEWS Interventions Administered scheduled meds/treatments;Administered prn meds/treatments  Pain Scale 0-10  Pain Score 5  Pain Type Chronic pain  Pain Location Generalized  Pain Descriptors / Indicators Aching  Pain Frequency Intermittent  Pain Onset On-going  Patients Stated Pain Goal 3  Pain Intervention(s) Medication (See eMAR);Emotional support  Multiple Pain Sites No  Complains of Anxiety  Neuro symptoms relieved by Relaxation techniques (Comment);Rest;Anti-anxiety medication  Escalate  MEWS: Escalate  (Red MEWs, discussed with MD, Charge Nurse)  Notify: Charge Nurse/RN  Name of Charge Nurse/RN Notified Jaquetta, RN  Date Charge Nurse/RN Notified 11/04/19  Time Charge Nurse/RN Notified 2318  Notify: Provider  Provider Name/Title Dr. Antionette Char, T.  Date Provider Notified 11/04/19  Time Provider Notified 2320  Notification Type Page  Notification Reason Other (Comment) (Red MEWs)  Response Other (Comment) (MD came to pt's room and assessed her)  Date of Provider Response 11/04/19  Time of Provider Response 2336  Document  Patient Outcome Not stable and remains on department (But has improved significantly)

## 2019-11-05 NOTE — H&P (Signed)
History and Physical   Amber [name] HQI:[id number] DOB: [birth year] DOA: 11/05/2019  PCP: Patient, No Pcp Per   Patient coming from: Home  Chief Complaint: Returning after leaving AMA earlier today  HPI: Amber [name] is a 29 y.o. female with medical history significant of recent admission for MRSA bacteremia, endocarditis, septic emboli, ventricular mass presents after leaving AMA this morning.  She states she left because she became overwhelmed with the possibility of needing to stay for 6 weeks to complete IV antibiotic therapy.  She states she realized she made a mistake after being gone for only about 2 hours.  She repeatedly denies any drug use while she was away.  She denies any new symptoms from this morning.  She reports her back pain is more severe at this time.  She also has some left ankle pain that she noticed when she was walking around after leaving the hospital.   Brief narrative from previous hospitalization (including this morning) is below: Patient has history of IVDU and came in for headache found febrile tachycardia bacteremia with  blood cultures on November 03, 2019 + for MRSA CT chest showed septic emboli with a left upper lobe cavitary nodule and small bilateral pleural effusion MRI showed some edema associated with the facet joint at L4-L5 but no effusion, show diffuse cellulitis along the heel pad no fluid collection or evidence of osteo.  2D echo was done  that showed an ill-defined mass in the right ventricular cavity of large size, and possible vegetation on the tricuspid valve, ID was consulted recommend to start IV vancomycin and repeat blood cultures on November 05, 2019, cardiology was consulted who recommended a cardiac MRI.  ED Course: Patient intermittently tachycardic and tachypneic in ED consistent with known sepsis secondary to bacterial endocarditis.  Blood pressure stable.  Did spike fever to 100.5.  Lab work-up showed improving hyponatremia at 131,  potassium of 3.4, bicarb 20.  Also noted is hemoglobin of 9.9 which is stable and continue leukocytosis at 12.7.  Repeat blood cultures have been obtained.  Initial lactic acid was elevated to 2.8 but improved to 1.6 on repeat.  Review of Systems: As per HPI otherwise all other systems reviewed and are negative.  Past Medical History:  Diagnosis Date   Heroin abuse (HCC)    Kidney stones    MRSA (methicillin resistant staph aureus) culture positive    Ovarian cyst    UTI (lower urinary tract infection)     Past Surgical History:  Procedure Laterality Date   FINGER SURGERY      Social History  reports that she has quit smoking. Her smoking use included cigarettes. She has never used smokeless tobacco. She reports current drug use. Drugs: Marijuana and IV. She reports that she does not drink alcohol.  No Known Allergies  Family History  Problem Relation Age of Onset   Valvular heart disease Maternal Grandmother   Reviewed on admission  Prior to Admission medications   Medication Sig Start Date End Date Taking? Authorizing Provider  acetaminophen (TYLENOL) 500 MG tablet Take 1,000 mg by mouth every 6 (six) hours as needed.    [provider]  cephALEXin (KEFLEX) 500 MG capsule Take 1 capsule (500 mg total) by mouth 4 (four) times daily. Patient not taking: Reported on 11/03/2019 01/11/19   Liberty Handy, PA-C  doxycycline (VIBRAMYCIN) 100 MG capsule Take 1 capsule (100 mg total) by mouth 2 (two) times daily. Patient not taking: Reported on 11/03/2019 08/20/18  Fayrene Helperran, Bowie, PA-C  ibuprofen (ADVIL) 600 MG tablet Take 1 tablet (600 mg total) by mouth every 6 (six) hours as needed. Patient not taking: Reported on 11/03/2019 08/20/18   Fayrene Helperran, [place], PA-C  ondansetron (ZOFRAN ODT) 4 MG disintegrating tablet Take 1 tablet (4 mg total) by mouth every 8 (eight) hours as needed for nausea or vomiting. Patient not taking: Reported on 11/03/2019 01/11/19   Liberty HandyGibbons, Claudia J,  [place]-C    Physical Exam: Vitals:   11/05/19 2215 11/05/19 2230 11/05/19 2245 11/05/19 2300  BP: 120/75 119/78 118/68 116/70  Pulse:  (!) 118 (!) 121 (!) 119  Resp: (!) 43 (!) 38 (!) 38 (!) 35  Temp:      TempSrc:      SpO2:  100% 100% 100%   Physical Exam  Labs on Admission: I have personally reviewed following labs and imaging studies  CBC: Recent Labs  Lab 11/03/19 0950 11/04/19 0434 11/05/19 2014  WBC 10.7* 10.3 12.7*  NEUTROABS 9.3*  --  11.0*  HGB 12.0 9.3* 9.9*  HCT 35.6* 27.9* 30.8*  MCV 79.3* 80.2 81.3  PLT 173 193 346    Basic Metabolic Panel: Recent Labs  Lab 11/03/19 0950 11/03/19 2240 11/04/19 0434 11/05/19 2014  NA 128*   123* 131* 129* 131*  K 4.2   4.1 3.8 3.5 3.4*  CL 89*   86* 95* 96* 99  CO2 22   22 25 24  20*  GLUCOSE 88   92 105* 134* 148*  BUN 39*   39* 30* 22* 8  CREATININE 1.33*   1.33* 0.77 0.66 0.66  CALCIUM 8.2*   8.0* 7.9* 7.5* 8.3*  PHOS 4.2 3.6 2.6  --     GFR: Estimated Creatinine Clearance: 82.1 mL/min (by C-G formula based on SCr of 0.66 mg/dL).  Liver Function Tests: Recent Labs  Lab 11/03/19 0950 11/03/19 2240 11/04/19 0434 11/04/19 1550 11/05/19 2014  AST 84*  --   --  29 32  ALT 36  --   --  23 22  ALKPHOS 133*  --   --  103 128*  BILITOT 1.0  --   --  0.8 0.4  PROT 7.5  --   --  6.0* 6.9  ALBUMIN 2.2*   2.2* 1.8* 1.5* 1.7* 1.8*    Urine analysis:    Component Value Date/Time   COLORURINE AMBER (A) 11/03/2019 0818   APPEARANCEUR CLOUDY (A) 11/03/2019 0818   APPEARANCEUR Turbid 01/27/2014 1811   LABSPEC 1.017 11/03/2019 0818   LABSPEC 1.011 01/27/2014 1811   PHURINE 5.0 11/03/2019 0818   GLUCOSEU NEGATIVE 11/03/2019 0818   GLUCOSEU Negative 01/27/2014 1811   HGBUR NEGATIVE 11/03/2019 0818   BILIRUBINUR NEGATIVE 11/03/2019 0818   BILIRUBINUR Negative 01/27/2014 1811   KETONESUR NEGATIVE 11/03/2019 0818   PROTEINUR NEGATIVE 11/03/2019 0818   UROBILINOGEN 0.2 10/14/2012 2010   NITRITE NEGATIVE 11/03/2019  0818   LEUKOCYTESUR MODERATE (A) 11/03/2019 0818   LEUKOCYTESUR Negative 01/27/2014 1811    Radiological Exams on Admission: DG Chest 2 View  Result Date: 11/05/2019 CLINICAL DATA:  Back pain EXAM: CHEST - 2 VIEW COMPARISON:  11/03/2019 FINDINGS: Cardiac shadow is stable. Scattered nodules are identified throughout both lungs similar to that seen on prior CT examination is suspicious for septic emboli. No new pleural effusion is seen. No bony abnormality is noted. IMPRESSION: Scattered nodules consistent with septic emboli similar to that seen on prior CT examination. No new focal abnormality is seen. Electronically Signed  By: Alcide Clever M.D.   On: 11/05/2019 21:35   MR BRAIN WO CONTRAST  Result Date: 11/04/2019 CLINICAL DATA:  Bacteremia.  Rule out septic emboli. EXAM: MRI HEAD WITHOUT CONTRAST TECHNIQUE: Multiplanar, multiecho pulse sequences of the brain and surrounding structures were obtained without intravenous contrast. COMPARISON:  Head CT January 18, 2018. FINDINGS: Brain: No acute infarction, hemorrhage, hydrocephalus, extra-axial collection or mass lesion. The brain parenchyma has normal morphology and signal characteristics. Vascular: Normal flow voids. Skull and upper cervical spine: Diffuse decrease of the T1 signal within the visualized upper cervical spine, calvarium and mandible may represent red marrow reconversion. Correlate with CBC. Sinuses/Orbits: Minimal mucosal thickening of the right maxillary sinus. The orbits are maintained. Other: None. IMPRESSION: 1. No acute intracranial abnormality. Unremarkable MRI of the brain. 2. Diffuse decrease of the T1 signal within the visualized upper cervical spine, calvarium and mandible may represent red marrow reconversion. Correlate with CBC. Electronically Signed   By: Baldemar Lenis M.D.   On: 11/04/2019 20:52   MR THORACIC SPINE WO CONTRAST  Result Date: 11/04/2019 CLINICAL DATA:  Bacteremia.  Back pain. EXAM: MRI  THORACIC AND LUMBAR SPINE WITHOUT CONTRAST TECHNIQUE: Multiplanar and multiecho pulse sequences of the thoracic and lumbar spine were obtained without intravenous contrast. The patient refused contrast administration. COMPARISON:  Chest CT yesterday. FINDINGS: MRI THORACIC SPINE FINDINGS Alignment:  Normal Vertebrae: Normal Cord:  Normal Paraspinal and other soft tissues: Small effusions layering dependently, right more than left, with dependent atelectasis in the lower lobes right more than left. Patchy infiltrates as were shown by CT. Disc levels: No degenerative disc disease. No stenosis of the canal or foramina. No evidence of discitis osteomyelitis or infected facet joint. MRI LUMBAR SPINE FINDINGS Segmentation:  5 lumbar type vertebral bodies. Alignment:  Normal Vertebrae:  Vertebral bodies are normal. Conus medullaris: Extends to the L1-2 level and appears normal. Paraspinal and other soft tissues: Negative Disc levels: No evidence of degenerative disc disease or disc space infection. Patient has some edema associated with the facet joints at L4-5, without frank joint effusions. This could be degenerative in nature or could reflect the earliest changes of facet joint infection. IMPRESSION: 1. No evidence of discitis osteomyelitis or infected facet joint in the thoracic region. 2. No evidence of degenerative disc disease or disc space infection in the lumbar region. There is some edema associated with the facet joints at L4-5, without frank joint effusions. This could be degenerative in nature or could reflect the earliest changes of facet joint infection. 3. Small pleural effusions layering dependently, right more than left, with dependent atelectasis in the lower lobes right more than left. Focal pulmonary infiltrates as shown by CT yesterday. Electronically Signed   By: Paulina Fusi M.D.   On: 11/04/2019 15:34   MR LUMBAR SPINE WO CONTRAST  Result Date: 11/04/2019 CLINICAL DATA:  Bacteremia.  Back  pain. EXAM: MRI THORACIC AND LUMBAR SPINE WITHOUT CONTRAST TECHNIQUE: Multiplanar and multiecho pulse sequences of the thoracic and lumbar spine were obtained without intravenous contrast. The patient refused contrast administration. COMPARISON:  Chest CT yesterday. FINDINGS: MRI THORACIC SPINE FINDINGS Alignment:  Normal Vertebrae: Normal Cord:  Normal Paraspinal and other soft tissues: Small effusions layering dependently, right more than left, with dependent atelectasis in the lower lobes right more than left. Patchy infiltrates as were shown by CT. Disc levels: No degenerative disc disease. No stenosis of the canal or foramina. No evidence of discitis osteomyelitis or infected facet joint. MRI  LUMBAR SPINE FINDINGS Segmentation:  5 lumbar type vertebral bodies. Alignment:  Normal Vertebrae:  Vertebral bodies are normal. Conus medullaris: Extends to the L1-2 level and appears normal. Paraspinal and other soft tissues: Negative Disc levels: No evidence of degenerative disc disease or disc space infection. Patient has some edema associated with the facet joints at L4-5, without frank joint effusions. This could be degenerative in nature or could reflect the earliest changes of facet joint infection. IMPRESSION: 1. No evidence of discitis osteomyelitis or infected facet joint in the thoracic region. 2. No evidence of degenerative disc disease or disc space infection in the lumbar region. There is some edema associated with the facet joints at L4-5, without frank joint effusions. This could be degenerative in nature or could reflect the earliest changes of facet joint infection. 3. Small pleural effusions layering dependently, right more than left, with dependent atelectasis in the lower lobes right more than left. Focal pulmonary infiltrates as shown by CT yesterday. Electronically Signed   By: Paulina Fusi M.D.   On: 11/04/2019 15:34   CT ANKLE RIGHT W CONTRAST  Result Date: 11/04/2019 CLINICAL DATA:  Foot  swelling bacteremia EXAM: CT OF THE RIGHT ANKLE WITH CONTRAST TECHNIQUE: Multidetector CT imaging of the right ankle was performed following the standard protocol during bolus administration of intravenous contrast. CONTRAST:  OMNIPAQUE IOHEXOL 300 MG/ML  SOLN COMPARISON:  None. FINDINGS: Bones/Joint/Cartilage No fracture or dislocation. No areas of cortical destruction or periosteal reaction are seen. The articular surfaces appear maintained. No large ankle joint effusion. Ligaments Suboptimally assessed by CT. Muscles and Tendons The muscles surrounding the ankle and foot appear to be intact without focal atrophy or tear. The flexor and extensor tendons are intact. The Achilles tendon is intact. Soft tissues: There is subcutaneous edema seen along the posterior heel pad with mild skin thickening. There also appears to be mildly heterogeneous density with thickening in the middle cord of the plantar fascia, however it is intact. No loculated fluid collections are noted. IMPRESSION: Findings which could be suggestive of diffuse cellulitis along the heel pad. No loculated fluid collections or definite evidence of osteomyelitis. Electronically Signed   By: Jonna Clark M.D.   On: 11/04/2019 21:45   CT FOOT LEFT W CONTRAST  Result Date: 11/04/2019 CLINICAL DATA:  Foot swelling bacteremia EXAM: CT OF THE RIGHT ANKLE WITH CONTRAST TECHNIQUE: Multidetector CT imaging of the right ankle was performed following the standard protocol during bolus administration of intravenous contrast. CONTRAST:  OMNIPAQUE IOHEXOL 300 MG/ML  SOLN COMPARISON:  None. FINDINGS: Bones/Joint/Cartilage No fracture or dislocation. No areas of cortical destruction or periosteal reaction are seen. The articular surfaces appear maintained. No large ankle joint effusion. Ligaments Suboptimally assessed by CT. Muscles and Tendons The muscles surrounding the ankle and foot appear to be intact without focal atrophy or tear. The flexor and  extensor tendons are intact. The Achilles tendon is intact. Soft tissues: There is subcutaneous edema seen along the posterior heel pad with mild skin thickening. There also appears to be mildly heterogeneous density with thickening in the middle cord of the plantar fascia, however it is intact. No loculated fluid collections are noted. IMPRESSION: Findings which could be suggestive of diffuse cellulitis along the heel pad. No loculated fluid collections or definite evidence of osteomyelitis. Electronically Signed   By: Jonna Clark M.D.   On: 11/04/2019 21:45   ECHOCARDIOGRAM COMPLETE  Result Date: 11/04/2019    ECHOCARDIOGRAM REPORT   Patient Name:  Raveen Baldemar Lenis Date of Exam: 11/04/2019 Medical Rec #:  [id number]        Height:       62.0 in Accession #:    [id number]       Weight:       105.0 lb Date of Birth:  1990/12/11         BSA:          1.454 m Patient Age:    29 years         BP:           95/58 mmHg Patient Gender: F                HR:           97 bpm. Exam Location:  Inpatient Procedure: 2D Echo Indications:    Bacteremia  History:        Patient has no prior history of Echocardiogram examinations.                 Risk Factors:IV Drug abuse.  Sonographer:    Thurman Coyer RDCS (AE) Referring Phys: [id number] Teddy Spike IMPRESSIONS  1. Left ventricular ejection fraction, by estimation, is 60 to 65%. The left ventricle has normal function. The left ventricle has no regional wall motion abnormalities. Left ventricular diastolic parameters were normal.  2. Right ventricular systolic function There is an ill defined mass encompassing a large portion of the RV cavity. Recommend cardiac MRI for further evaulation.. The right ventricular size is normal. There is normal pulmonary artery systolic pressure. The estimated right ventricular systolic pressure is 18.4 mmHg.  3. The mitral valve is normal in structure. No evidence of mitral valve regurgitation. No evidence of mitral stenosis.  4. Possible  vegetation on TV leaflet. Consider TEE for further evaulation. . The tricuspid valve is abnormal.  5. The aortic valve was not well visualized. Aortic valve regurgitation is not visualized. No aortic stenosis is present.  6. The inferior vena cava is normal in size with greater than 50% respiratory variability, suggesting right atrial pressure of 3 mmHg.Recommend Cardiac MRI to evaluated abnormality in RV cavity. ALso ecoh concerning for TV endocardidits. FINDINGS  Left Ventricle: Left ventricular ejection fraction, by estimation, is 60 to 65%. The left ventricle has normal function. The left ventricle has no regional wall motion abnormalities. The left ventricular internal cavity size was normal in size. There is  no left ventricular hypertrophy. Left ventricular diastolic parameters were normal. Normal left ventricular filling pressure. Right Ventricle: The right ventricular size is normal. No increase in right ventricular wall thickness. Right ventricular systolic function There is an ill defined mass encompassing a large portion of the RV cavity. Recommend cardiac MRI for further evaulation. There is normal pulmonary artery systolic pressure. The tricuspid regurgitant velocity is 1.96 m/s, and with an assumed right atrial pressure of 3 mmHg, the estimated right ventricular systolic pressure is 18.4 mmHg. Left Atrium: Left atrial size was normal in size. Right Atrium: Right atrial size was normal in size. Pericardium: There is no evidence of pericardial effusion. Mitral Valve: The mitral valve is normal in structure. No evidence of mitral valve regurgitation. No evidence of mitral valve stenosis. Tricuspid Valve: Possible vegetation on TV leaflet. Consider TEE for further evaulation. The tricuspid valve is abnormal. Tricuspid valve regurgitation is mild . No evidence of tricuspid stenosis. Aortic Valve: The aortic valve was not well visualized. Aortic valve regurgitation is not visualized. No aortic stenosis is  present.  Pulmonic Valve: The pulmonic valve was normal in structure. Pulmonic valve regurgitation is not visualized. No evidence of pulmonic stenosis. Aorta: The aortic root is normal in size and structure. Venous: The inferior vena cava is normal in size with greater than 50% respiratory variability, suggesting right atrial pressure of 3 mmHg. IAS/Shunts: No atrial level shunt detected by color flow Doppler.  LEFT VENTRICLE PLAX 2D LVIDd:         3.59 cm  Diastology LVIDs:         2.31 cm  LV e' medial:    9.57 cm/s LV PW:         0.61 cm  LV E/e' medial:  7.8 LV IVS:        0.80 cm  LV e' lateral:   8.70 cm/s LVOT diam:     2.10 cm  LV E/e' lateral: 8.5 LV SV:         55 LV SV Index:   38 LVOT Area:     3.46 cm  RIGHT VENTRICLE RV S prime:     12.70 cm/s TAPSE (M-mode): 2.0 cm LEFT ATRIUM             Index       RIGHT ATRIUM           Index LA diam:        1.60 cm 1.10 cm/m  RA Area:     13.70 cm LA Vol (A2C):   31.6 ml 21.74 ml/m RA Volume:   32.10 ml  22.08 ml/m LA Vol (A4C):   20.8 ml 14.31 ml/m LA Biplane Vol: 26.7 ml 18.37 ml/m  AORTIC VALVE LVOT Vmax:   90.90 cm/s LVOT Vmean:  55.500 cm/s LVOT VTI:    0.158 m  AORTA Ao Root diam: 2.70 cm MITRAL VALVE               TRICUSPID VALVE MV Area (PHT): 3.37 cm    TR Peak grad:   15.4 mmHg MV Decel Time: 225 msec    TR Vmax:        196.00 cm/s MV E velocity: 74.20 cm/s MV A velocity: 60.60 cm/s  SHUNTS MV E/A ratio:  1.22        Systemic VTI:  0.16 m                            Systemic Diam: 2.10 cm Armanda Magic MD Electronically signed by Armanda Magic MD Signature Date/Time: 11/04/2019/2:03:31 PM    Final     EKG: Independently reviewed.  Sinus tachycardia with borderline repolarization abnormality.  Assessment/Plan Principal Problem:   Bacterial endocarditis Active Problems:   IV drug abuse (HCC)   Sepsis (HCC)   MRSA bacteremia   Septic embolism (HCC)   Hyponatremia   Right ventricular mass  Sepsis in person using IV drugs 2/2 cellulitis of  the left foot and MRSA bacteremia/septic emboli: > Reconsult infectious disease, cardiology, cardiothoracic surgery in a.m. if not automatically reconsulted. Blood cultures were positive x3 for MRSA. Repeated blood cultures ordered for November 05, 2019, obtained in ED MRI  Brain showed no acute findings. 2D echo showed possible vegetation high ventricular mass. Continue IV vancomycin.  Appreciate IDs assistance Continue tramadol, Toradol and Robaxin for pain. Now off of Suboxone and is not interested in restarting, she did not like how it made her feel, pain control with Toradol  Right ventricular mass: Possible right ventricular mass cardiac MRI  was recommended discussed with CT surgery Dr. Laneta Simmers recommended to consider anticoagulation.  MRI of the brain showed no acute findings. Continue IV heparin  Polysubstance use: Suboxone not reordered. HIV is negative hepatitis C positive. Continue tramadol and Toradol for pain avoid narcotics.  Hypovolemic hyponatremia: Continue IV fluid hydration.  DVT prophylaxis: Heparin  Code Status:   Full  Family Communication:  None on admission  Disposition Plan:   Patient is from:  Home  Anticipated DC to:  Home  Anticipated DC date:  In several days  Anticipated DC barriers: Recently left AMA   Consults called:  None, her prior consultants will need to be reconsulted tomorrow.  Admission status:  Inpatient, telemetry   Severity of Illness: The appropriate patient status for this patient is INPATIENT. Inpatient status is judged to be reasonable and necessary in order to provide the required intensity of service to ensure the patient's safety. The patient's presenting symptoms, physical exam findings, and initial radiographic and laboratory data in the context of their chronic comorbidities is felt to place them at high risk for further clinical deterioration. Furthermore, it is not anticipated that the patient will be medically stable for  discharge from the hospital within 2 midnights of admission. The following factors support the patient status of inpatient.   " The patient's presenting symptoms include severe back pain, ankle pain, tachycardia, tachypnea. " The worrisome physical exam findings include tachypnea, tachycardia, ankle pain, back pain. " The initial radiographic and laboratory data are worrisome because of known bacterial endocarditis and bacteremia secondary to MRSA.  With LV mass concerning for thrombus. " The chronic co-morbidities include IV drug use.   * I certify that at the point of admission it is my clinical judgment that the patient will require inpatient hospital care spanning beyond 2 midnights from the point of admission due to high intensity of service, high risk for further deterioration and high frequency of surveillance required.Synetta Fail MD [facility]  How to contact the [facility] Attending or Consulting provider 7A - 7P or covering provider during after hours 7P -7A, for this patient?   1. Check the care team in [facility] and look for a) attending/consulting [facility] provider listed and b) the [facility] team listed 2. Log into [email or web address] and use [place]'s universal password to access. If you do not have the password, please contact the hospital operator. 3. Locate the [facility] provider you are looking for under [facility] and page to a number that you can be directly reached. 4. If you still have difficulty reaching the provider, please page the [facility] (Director on Call) for the Hospitalists listed on amion for assistance.  11/05/2019, 11:57 PM

## 2019-11-05 NOTE — ED Triage Notes (Signed)
Pt presents with back pain, tearful. Pt states she left AMA from floor under inpatient admission for ? Heart bacteria, ? "clot in my lung". Pt noted to have what appear to be fresh puncture wounds to arms. When ask why she left pt states "because I'm stupid".

## 2019-11-05 NOTE — Progress Notes (Signed)
ANTICOAGULATION CONSULT NOTE   Pharmacy Consult for heparin Indication: RV mass  No Known Allergies  Patient Measurements:   Heparin Dosing Weight: 50.4  Vital Signs: Temp: 98.8 F (37.1 C) (10/23 1951) Temp Source: Oral (10/23 1951) BP: 121/82 (10/23 2145) Pulse Rate: 112 (10/23 2145)  Labs: Recent Labs    11/03/19 0950 11/03/19 0950 11/03/19 2240 11/04/19 0434 11/05/19 2014  HGB 12.0   < >  --  9.3* 9.9*  HCT 35.6*  --   --  27.9* 30.8*  PLT 173  --   --  193 346  APTT 32  --   --   --   --   LABPROT 15.8*  --   --  18.0*  --   INR 1.3*  --   --  1.5*  --   CREATININE 1.33*  1.33*   < > 0.77 0.66 0.66  CKTOTAL  --   --  90  --   --   TROPONINIHS  --   --  14  --   --    < > = values in this interval not displayed.    Estimated Creatinine Clearance: 82.1 mL/min (by C-G formula based on SCr of 0.66 mg/dL).   Medical History: Past Medical History:  Diagnosis Date  . Heroin abuse (HCC)   . Kidney stones   . MRSA (methicillin resistant staph aureus) culture positive   . Ovarian cyst   . UTI (lower urinary tract infection)     Medications:  Scheduled:    Assessment: 89 YOF admitted with sepsis 2/2 MRSA bacteremia, cellulitis of left foot, and septic emboli found to have possible RV mass. Pharmacy has been consulted to start heparin.   No AC prior to admission. On prophylactic enox during admission. Hgb dropped, PLT stable, baseline INR slightly elevated 1.5. Patient had left AMA earlier today - received 3500 unit bolus on 10/23@0848  followed by infusion. IV were removed at 1321 per charting. Hgb 9.9, plt 346.  Goal of Therapy:  Heparin level 0.3-0.7 units/ml Monitor platelets by anticoagulation protocol: Yes   Plan:  Give 3000 units bolus x 1 Start heparin infusion at 850 units/hr Check anti-Xa level in 6 hours and daily while on heparin Continue to monitor H&H and platelets  Sherron Monday, PharmD, BCCCP Clinical Pharmacist  Phone:  [phone] 11/05/2019 10:03 PM  Please check AMION for all [facility] Pharmacy phone numbers After 10:00 PM, call Main Pharmacy [phone]

## 2019-11-05 NOTE — ED Provider Notes (Addendum)
[facility] EMERGENCY DEPARTMENT Provider Note   CSN: [id number] Arrival date & time: 11/05/19  1943     History Chief Complaint  Patient presents with  . Back Pain    Amber [name] is a 29 y.o. female.  HPI 29 year old female with history of IVDA, MRSA, septic emboli, admitted to the hospital on 10/21 with sepsis with concerns for septic emboli + MRSA and possible early facet joint infection at L4- 5 w/ possible tricuspid valve vegetation, left AMA earlier today citing "freaking out" at the thought of having to stay in the hospital for 6 weeks presents back to the ER with severe back pain.  Patient denies any repeated use of drugs after she left the hospital.  States that she can barely walk due to the pain.  Reports "I was stupid for leaving".  Would like to be readmitted for further treatment.    Past Medical History:  Diagnosis Date  . Heroin abuse (HCC)   . Kidney stones   . MRSA (methicillin resistant staph aureus) culture positive   . Ovarian cyst   . UTI (lower urinary tract infection)     Patient Active Problem List   Diagnosis Date Noted  . Septic embolism (HCC) 11/05/2019  . AKI (acute kidney injury) (HCC) 11/05/2019  . Hyponatremia 11/05/2019  . Cellulitis of left foot 11/05/2019  . 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 Acute bacterial endocarditis   . MRSA bacteremia   . Sepsis (HCC) 11/03/2019  . Abscess of face 08/20/2018  . Facial cellulitis 11/21/2017  . Depression   . IV drug abuse (HCC)   . Suicidal ideation     Past Surgical History:  Procedure Laterality Date  . FINGER SURGERY       OB History   No obstetric history on file.     Family History  Problem Relation Age of Onset  . Valvular heart disease Maternal Grandmother     Social History   Tobacco Use  . Smoking status: Former Smoker    Types: Cigarettes  . Smokeless tobacco: Never Used  Vaping Use  . Vaping Use: Never used  Substance Use Topics  .  Alcohol use: No  . Drug use: Yes    Types: Marijuana, IV    Comment: opiods- pain medication/heroin    Home Medications Prior to Admission medications   Medication Sig Start Date End Date Taking? Authorizing Provider  acetaminophen (TYLENOL) 500 MG tablet Take 1,000 mg by mouth every 6 (six) hours as needed.    [provider]  cephALEXin (KEFLEX) 500 MG capsule Take 1 capsule (500 mg total) by mouth 4 (four) times daily. Patient not taking: Reported on 11/03/2019 01/11/19   Liberty Handy, PA-C  doxycycline (VIBRAMYCIN) 100 MG capsule Take 1 capsule (100 mg total) by mouth 2 (two) times daily. Patient not taking: Reported on 11/03/2019 08/20/18   Fayrene Helper, PA-C  ibuprofen (ADVIL) 600 MG tablet Take 1 tablet (600 mg total) by mouth every 6 (six) hours as needed. Patient not taking: Reported on 11/03/2019 08/20/18   Fayrene Helper, PA-C  ondansetron (ZOFRAN ODT) 4 MG disintegrating tablet Take 1 tablet (4 mg total) by mouth every 8 (eight) hours as needed for nausea or vomiting. Patient not taking: Reported on 11/03/2019 01/11/19   Liberty Handy, PA-C    Allergies    Patient has no known allergies.  Review of Systems   Review of Systems  Constitutional: Positive for chills and fever.  HENT:  Negative for ear pain and sore throat.   Eyes: Negative for pain and visual disturbance.  Respiratory: Positive for shortness of breath. Negative for cough.   Cardiovascular: Positive for chest pain. Negative for palpitations.  Gastrointestinal: Negative for abdominal pain and vomiting.  Genitourinary: Negative for dysuria and hematuria.  Musculoskeletal: Positive for arthralgias and back pain.  Skin: Negative for color change and rash.  Neurological: Negative for seizures and syncope.  All other systems reviewed and are negative.   Physical Exam Updated Vital Signs BP 117/79   Pulse (!) 105   Temp 98.8 F (37.1 C) (Oral)   Resp (!) 41   SpO2 100%   Physical Exam Vitals  and nursing note reviewed.  Constitutional:      General: She is not in acute distress.    Appearance: She is well-developed. She is ill-appearing.  HENT:     Head: Normocephalic and atraumatic.     Mouth/Throat:     Mouth: Mucous membranes are moist.     Pharynx: Oropharynx is clear.  Eyes:     Conjunctiva/sclera: Conjunctivae normal.  Cardiovascular:     Rate and Rhythm: Normal rate and regular rhythm.     Heart sounds: No murmur heard.   Pulmonary:     Effort: Pulmonary effort is normal. No respiratory distress.     Breath sounds: Normal breath sounds.  Abdominal:     General: Abdomen is flat.     Palpations: Abdomen is soft.     Tenderness: There is no abdominal tenderness.  Musculoskeletal:        General: Tenderness present. No deformity or signs of injury.     Cervical back: Normal range of motion and neck supple.     Right lower leg: No edema.     Left lower leg: No edema.  Skin:    General: Skin is warm and dry.  Neurological:     General: No focal deficit present.     Mental Status: She is alert and oriented to person, place, and time.     Sensory: No sensory deficit.     Motor: Weakness present.     Comments: Midline tenderness to the L-spine, limited range of motion due to pain.  No C, T spine tenderness.  Decreased strength secondary to pain.  Gross sensations intact.     ED Results / Procedures / Treatments   Labs (all labs ordered are listed, but only abnormal results are displayed) Labs Reviewed  LACTIC ACID, PLASMA - Abnormal; Notable for the following components:      Result Value   Lactic Acid, Venous 2.8 (*)    All other components within normal limits  COMPREHENSIVE METABOLIC PANEL - Abnormal; Notable for the following components:   Sodium 131 (*)    Potassium 3.4 (*)    CO2 20 (*)    Glucose, Bld 148 (*)    Calcium 8.3 (*)    Albumin 1.8 (*)    Alkaline Phosphatase 128 (*)    All other components within normal limits  CBC WITH  DIFFERENTIAL/PLATELET - Abnormal; Notable for the following components:   WBC 12.7 (*)    RBC 3.79 (*)    Hemoglobin 9.9 (*)    HCT 30.8 (*)    Neutro Abs 11.0 (*)    Abs Immature Granulocytes 0.22 (*)    All other components within normal limits  CULTURE, BLOOD (ROUTINE X 2)  CULTURE, BLOOD (ROUTINE X 2)  LACTIC ACID, PLASMA  URINALYSIS, ROUTINE W  REFLEX MICROSCOPIC  I-STAT BETA HCG BLOOD, ED ([facility], [facility], [facility] ONLY)    EKG EKG Interpretation  Date/Time:  Saturday November 05 2019 20:15:26 EDT Ventricular Rate:  109 PR Interval:    QRS Duration: 86 QT Interval:  323 QTC Calculation: 435 R Axis:   91 Text Interpretation: Sinus tachycardia Borderline right axis deviation Borderline Q waves in inferior leads Borderline repolarization abnormality No significant change since prior today Confirmed by Meridee Score [phone]) on 11/05/2019 8:18:09 PM   Radiology DG Chest 2 View  Result Date: 11/05/2019 CLINICAL DATA:  Back pain EXAM: CHEST - 2 VIEW COMPARISON:  11/03/2019 FINDINGS: Cardiac shadow is stable. Scattered nodules are identified throughout both lungs similar to that seen on prior CT examination is suspicious for septic emboli. No new pleural effusion is seen. No bony abnormality is noted. IMPRESSION: Scattered nodules consistent with septic emboli similar to that seen on prior CT examination. No new focal abnormality is seen. Electronically Signed   By: Alcide Clever M.D.   On: 11/05/2019 21:35   MR BRAIN WO CONTRAST  Result Date: 11/04/2019 CLINICAL DATA:  Bacteremia.  Rule out septic emboli. EXAM: MRI HEAD WITHOUT CONTRAST TECHNIQUE: Multiplanar, multiecho pulse sequences of the brain and surrounding structures were obtained without intravenous contrast. COMPARISON:  Head CT January 18, 2018. FINDINGS: Brain: No acute infarction, hemorrhage, hydrocephalus, extra-axial collection or mass lesion. The brain parenchyma has normal morphology and signal characteristics. Vascular: Normal  flow voids. Skull and upper cervical spine: Diffuse decrease of the T1 signal within the visualized upper cervical spine, calvarium and mandible may represent red marrow reconversion. Correlate with CBC. Sinuses/Orbits: Minimal mucosal thickening of the right maxillary sinus. The orbits are maintained. Other: None. IMPRESSION: 1. No acute intracranial abnormality. Unremarkable MRI of the brain. 2. Diffuse decrease of the T1 signal within the visualized upper cervical spine, calvarium and mandible may represent red marrow reconversion. Correlate with CBC. Electronically Signed   By: Baldemar Lenis M.D.   On: 11/04/2019 20:52   MR THORACIC SPINE WO CONTRAST  Result Date: 11/04/2019 CLINICAL DATA:  Bacteremia.  Back pain. EXAM: MRI THORACIC AND LUMBAR SPINE WITHOUT CONTRAST TECHNIQUE: Multiplanar and multiecho pulse sequences of the thoracic and lumbar spine were obtained without intravenous contrast. The patient refused contrast administration. COMPARISON:  Chest CT yesterday. FINDINGS: MRI THORACIC SPINE FINDINGS Alignment:  Normal Vertebrae: Normal Cord:  Normal Paraspinal and other soft tissues: Small effusions layering dependently, right more than left, with dependent atelectasis in the lower lobes right more than left. Patchy infiltrates as were shown by CT. Disc levels: No degenerative disc disease. No stenosis of the canal or foramina. No evidence of discitis osteomyelitis or infected facet joint. MRI LUMBAR SPINE FINDINGS Segmentation:  5 lumbar type vertebral bodies. Alignment:  Normal Vertebrae:  Vertebral bodies are normal. Conus medullaris: Extends to the L1-2 level and appears normal. Paraspinal and other soft tissues: Negative Disc levels: No evidence of degenerative disc disease or disc space infection. Patient has some edema associated with the facet joints at L4-5, without frank joint effusions. This could be degenerative in nature or could reflect the earliest changes of facet  joint infection. IMPRESSION: 1. No evidence of discitis osteomyelitis or infected facet joint in the thoracic region. 2. No evidence of degenerative disc disease or disc space infection in the lumbar region. There is some edema associated with the facet joints at L4-5, without frank joint effusions. This could be degenerative in nature or could reflect the earliest  changes of facet joint infection. 3. Small pleural effusions layering dependently, right more than left, with dependent atelectasis in the lower lobes right more than left. Focal pulmonary infiltrates as shown by CT yesterday. Electronically Signed   By: Paulina Fusi M.D.   On: 11/04/2019 15:34   MR LUMBAR SPINE WO CONTRAST  Result Date: 11/04/2019 CLINICAL DATA:  Bacteremia.  Back pain. EXAM: MRI THORACIC AND LUMBAR SPINE WITHOUT CONTRAST TECHNIQUE: Multiplanar and multiecho pulse sequences of the thoracic and lumbar spine were obtained without intravenous contrast. The patient refused contrast administration. COMPARISON:  Chest CT yesterday. FINDINGS: MRI THORACIC SPINE FINDINGS Alignment:  Normal Vertebrae: Normal Cord:  Normal Paraspinal and other soft tissues: Small effusions layering dependently, right more than left, with dependent atelectasis in the lower lobes right more than left. Patchy infiltrates as were shown by CT. Disc levels: No degenerative disc disease. No stenosis of the canal or foramina. No evidence of discitis osteomyelitis or infected facet joint. MRI LUMBAR SPINE FINDINGS Segmentation:  5 lumbar type vertebral bodies. Alignment:  Normal Vertebrae:  Vertebral bodies are normal. Conus medullaris: Extends to the L1-2 level and appears normal. Paraspinal and other soft tissues: Negative Disc levels: No evidence of degenerative disc disease or disc space infection. Patient has some edema associated with the facet joints at L4-5, without frank joint effusions. This could be degenerative in nature or could reflect the earliest  changes of facet joint infection. IMPRESSION: 1. No evidence of discitis osteomyelitis or infected facet joint in the thoracic region. 2. No evidence of degenerative disc disease or disc space infection in the lumbar region. There is some edema associated with the facet joints at L4-5, without frank joint effusions. This could be degenerative in nature or could reflect the earliest changes of facet joint infection. 3. Small pleural effusions layering dependently, right more than left, with dependent atelectasis in the lower lobes right more than left. Focal pulmonary infiltrates as shown by CT yesterday. Electronically Signed   By: Paulina Fusi M.D.   On: 11/04/2019 15:34   CT ANKLE RIGHT W CONTRAST  Result Date: 11/04/2019 CLINICAL DATA:  Foot swelling bacteremia EXAM: CT OF THE RIGHT ANKLE WITH CONTRAST TECHNIQUE: Multidetector CT imaging of the right ankle was performed following the standard protocol during bolus administration of intravenous contrast. CONTRAST:  OMNIPAQUE IOHEXOL 300 MG/ML  SOLN COMPARISON:  None. FINDINGS: Bones/Joint/Cartilage No fracture or dislocation. No areas of cortical destruction or periosteal reaction are seen. The articular surfaces appear maintained. No large ankle joint effusion. Ligaments Suboptimally assessed by CT. Muscles and Tendons The muscles surrounding the ankle and foot appear to be intact without focal atrophy or tear. The flexor and extensor tendons are intact. The Achilles tendon is intact. Soft tissues: There is subcutaneous edema seen along the posterior heel pad with mild skin thickening. There also appears to be mildly heterogeneous density with thickening in the middle cord of the plantar fascia, however it is intact. No loculated fluid collections are noted. IMPRESSION: Findings which could be suggestive of diffuse cellulitis along the heel pad. No loculated fluid collections or definite evidence of osteomyelitis. Electronically Signed   By: Jonna Clark M.D.   On: 11/04/2019 21:45   CT FOOT LEFT W CONTRAST  Result Date: 11/04/2019 CLINICAL DATA:  Foot swelling bacteremia EXAM: CT OF THE RIGHT ANKLE WITH CONTRAST TECHNIQUE: Multidetector CT imaging of the right ankle was performed following the standard protocol during bolus administration of intravenous contrast. CONTRAST:  OMNIPAQUE  IOHEXOL 300 MG/ML  SOLN COMPARISON:  None. FINDINGS: Bones/Joint/Cartilage No fracture or dislocation. No areas of cortical destruction or periosteal reaction are seen. The articular surfaces appear maintained. No large ankle joint effusion. Ligaments Suboptimally assessed by CT. Muscles and Tendons The muscles surrounding the ankle and foot appear to be intact without focal atrophy or tear. The flexor and extensor tendons are intact. The Achilles tendon is intact. Soft tissues: There is subcutaneous edema seen along the posterior heel pad with mild skin thickening. There also appears to be mildly heterogeneous density with thickening in the middle cord of the plantar fascia, however it is intact. No loculated fluid collections are noted. IMPRESSION: Findings which could be suggestive of diffuse cellulitis along the heel pad. No loculated fluid collections or definite evidence of osteomyelitis. Electronically Signed   By: Jonna Clark M.D.   On: 11/04/2019 21:45   ECHOCARDIOGRAM COMPLETE  Result Date: 11/04/2019    ECHOCARDIOGRAM REPORT   Patient Name:   ZEN FELLING Date of Exam: 11/04/2019 Medical Rec #:  [id number]        Height:       62.0 in Accession #:    [id number]       Weight:       105.0 lb Date of Birth:  1990-06-27         BSA:          1.454 m Patient Age:    29 years         BP:           95/58 mmHg Patient Gender: F                HR:           97 bpm. Exam Location:  Inpatient Procedure: 2D Echo Indications:    Bacteremia  History:        Patient has no prior history of Echocardiogram examinations.                 Risk Factors:IV Drug abuse.   Sonographer:    Thurman Coyer RDCS (AE) Referring Phys: [id number] Teddy Spike IMPRESSIONS  1. Left ventricular ejection fraction, by estimation, is 60 to 65%. The left ventricle has normal function. The left ventricle has no regional wall motion abnormalities. Left ventricular diastolic parameters were normal.  2. Right ventricular systolic function There is an ill defined mass encompassing a large portion of the RV cavity. Recommend cardiac MRI for further evaulation.. The right ventricular size is normal. There is normal pulmonary artery systolic pressure. The estimated right ventricular systolic pressure is 18.4 mmHg.  3. The mitral valve is normal in structure. No evidence of mitral valve regurgitation. No evidence of mitral stenosis.  4. Possible vegetation on TV leaflet. Consider TEE for further evaulation. . The tricuspid valve is abnormal.  5. The aortic valve was not well visualized. Aortic valve regurgitation is not visualized. No aortic stenosis is present.  6. The inferior vena cava is normal in size with greater than 50% respiratory variability, suggesting right atrial pressure of 3 mmHg.Recommend Cardiac MRI to evaluated abnormality in RV cavity. ALso ecoh concerning for TV endocardidits. FINDINGS  Left Ventricle: Left ventricular ejection fraction, by estimation, is 60 to 65%. The left ventricle has normal function. The left ventricle has no regional wall motion abnormalities. The left ventricular internal cavity size was normal in size. There is  no left ventricular hypertrophy. Left ventricular diastolic parameters were normal. Normal left ventricular filling  pressure. Right Ventricle: The right ventricular size is normal. No increase in right ventricular wall thickness. Right ventricular systolic function There is an ill defined mass encompassing a large portion of the RV cavity. Recommend cardiac MRI for further evaulation. There is normal pulmonary artery systolic pressure. The tricuspid  regurgitant velocity is 1.96 m/s, and with an assumed right atrial pressure of 3 mmHg, the estimated right ventricular systolic pressure is 18.4 mmHg. Left Atrium: Left atrial size was normal in size. Right Atrium: Right atrial size was normal in size. Pericardium: There is no evidence of pericardial effusion. Mitral Valve: The mitral valve is normal in structure. No evidence of mitral valve regurgitation. No evidence of mitral valve stenosis. Tricuspid Valve: Possible vegetation on TV leaflet. Consider TEE for further evaulation. The tricuspid valve is abnormal. Tricuspid valve regurgitation is mild . No evidence of tricuspid stenosis. Aortic Valve: The aortic valve was not well visualized. Aortic valve regurgitation is not visualized. No aortic stenosis is present. Pulmonic Valve: The pulmonic valve was normal in structure. Pulmonic valve regurgitation is not visualized. No evidence of pulmonic stenosis. Aorta: The aortic root is normal in size and structure. Venous: The inferior vena cava is normal in size with greater than 50% respiratory variability, suggesting right atrial pressure of 3 mmHg. IAS/Shunts: No atrial level shunt detected by color flow Doppler.  LEFT VENTRICLE PLAX 2D LVIDd:         3.59 cm  Diastology LVIDs:         2.31 cm  LV e' medial:    9.57 cm/s LV PW:         0.61 cm  LV E/e' medial:  7.8 LV IVS:        0.80 cm  LV e' lateral:   8.70 cm/s LVOT diam:     2.10 cm  LV E/e' lateral: 8.5 LV SV:         55 LV SV Index:   38 LVOT Area:     3.46 cm  RIGHT VENTRICLE RV S prime:     12.70 cm/s TAPSE (M-mode): 2.0 cm LEFT ATRIUM             Index       RIGHT ATRIUM           Index LA diam:        1.60 cm 1.10 cm/m  RA Area:     13.70 cm LA Vol (A2C):   31.6 ml 21.74 ml/m RA Volume:   32.10 ml  22.08 ml/m LA Vol (A4C):   20.8 ml 14.31 ml/m LA Biplane Vol: 26.7 ml 18.37 ml/m  AORTIC VALVE LVOT Vmax:   90.90 cm/s LVOT Vmean:  55.500 cm/s LVOT VTI:    0.158 m  AORTA Ao Root diam: 2.70 cm MITRAL  VALVE               TRICUSPID VALVE MV Area (PHT): 3.37 cm    TR Peak grad:   15.4 mmHg MV Decel Time: 225 msec    TR Vmax:        196.00 cm/s MV E velocity: 74.20 cm/s MV A velocity: 60.60 cm/s  SHUNTS MV E/A ratio:  1.22        Systemic VTI:  0.16 m                            Systemic Diam: 2.10 cm Armanda Magic MD Electronically signed by Armanda Magic MD  Signature Date/Time: 11/04/2019/2:03:31 PM    Final     Procedures Procedures (including critical care time)  Medications Ordered in ED Medications - No data to display  ED Course  I have reviewed the triage vital signs and the nursing notes.  Pertinent labs & imaging results that were available during my care of the patient were reviewed by me and considered in my medical decision making (see chart for details).  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  She is back requesting readmission.  Getting some screening labs EKG.   [MB]    Clinical Course User Index [MB] Terrilee FilesButler, Michael C, MD   MDM Rules/Calculators/A&P                         29 year old female with history of IVDA admitted to the hospital for septic emboli in the lungs, concern for septic emboli on the tricuspid valve and possible early L4-5 facet infection left AMA earlier today presents for readmission.  Tachycardic and tachypneic, afebrile.  CMP with mild hyponatremia and hypokalemia, however no other significant abnormalities.  CBC with leukocytosis of 12.7, lactic acid of 2.8.  Chest x-ray confirming septic emboli in similar appearance.  EKG with sinus tach.  Consulted hospitalist team Dr. Alinda MoneyMelvin for admission. Repeat blood cultures pending.   Remains hemodynamically stable at this time   Final Clinical Impression(s) / ED Diagnoses Final diagnoses:  Sepsis due to methicillin resistant Staphylococcus aureus (MRSA), unspecified whether acute  organ dysfunction present [facility](HCC)    Rx / DC Orders ED Discharge Orders    None           Leone BrandBelaya, Melea Prezioso A, PA-C 11/05/19 2150    Terrilee FilesButler, Michael C, MD 11/06/19 1122

## 2019-11-05 NOTE — Progress Notes (Signed)
IV's removed and AMA paperwork signed.

## 2019-11-05 NOTE — Consult Note (Signed)
Cardiology Consultation:   Patient ID: CHEYENNE BORDEAUX MRN: [id number]; DOB: [birth year]  Admit date: 11/03/2019 Date of Consult: 11/05/2019  Primary Care Provider: Patient, No Pcp Per [facility] Cardiologist: No primary care provider on file.  [facility] Electrophysiologist:  None    Patient Profile:   Amber [name] is a 29 y.o. 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 Robb Matar.  History of Present Illness:   Amber [name] presented to the hospital on 10/21 with headache.  She was found to be febrile and tachycardic.  Blood cultures were positive for MRSA.  Head CT demonstrated septic emboli.  Chest CT showed numerous nodules bilaterally with some internal cavitation and mild interlobular septal thickening.  Findings were thought to be most consistent with septic emboli and mild pulmonary edema.  She had MRI of the spine which demonstrated some edema in the facet joints at L4/5 that was consistent with either degenerated Shuvon or early facet joint infection.  She had a CT of the ankle that was suggestive of diffuse cellulitis along the heel pad without evidence of osteomyelitis.  She had an echo that revealed LVEF 60 to 65% with normal diastolic function.  There was an ill-defined mass in the RV cavity.  There is also a possible vegetation on the tricuspid valve leaflet.  Cardiac MRI and TEE were recommended.  Amber [name] is currently resting comfortably and denies pain.  Her breathing is stable.     Past Medical History:  Diagnosis Date  . Heroin abuse (HCC)   . Kidney stones   . MRSA (methicillin resistant staph aureus) culture positive   . Ovarian cyst   . UTI (lower urinary tract infection)     Past Surgical History:  Procedure Laterality Date  . FINGER SURGERY       Home Medications:  Prior to Admission medications   Medication Sig Start Date End Date Taking? Authorizing Provider  acetaminophen  (TYLENOL) 500 MG tablet Take 1,000 mg by mouth every 6 (six) hours as needed.   Yes [provider]  cephALEXin (KEFLEX) 500 MG capsule Take 1 capsule (500 mg total) by mouth 4 (four) times daily. Patient not taking: Reported on 11/03/2019 01/11/19   Liberty Handy, PA-C  doxycycline (VIBRAMYCIN) 100 MG capsule Take 1 capsule (100 mg total) by mouth 2 (two) times daily. Patient not taking: Reported on 11/03/2019 08/20/18   Fayrene Helper, PA-C  ibuprofen (ADVIL) 600 MG tablet Take 1 tablet (600 mg total) by mouth every 6 (six) hours as needed. Patient not taking: Reported on 11/03/2019 08/20/18   Fayrene Helper, PA-C  ondansetron (ZOFRAN ODT) 4 MG disintegrating tablet Take 1 tablet (4 mg total) by mouth every 8 (eight) hours as needed for nausea or vomiting. Patient not taking: Reported on 11/03/2019 01/11/19   Liberty Handy, PA-C    Inpatient Medications: Scheduled Meds: . buprenorphine-naloxone  1 tablet Sublingual BID  . mouth rinse  15 mL Mouth Rinse BID   Continuous Infusions: . sodium chloride 75 mL/hr at 11/05/19 0848  . heparin 850 Units/hr (11/05/19 0842)  . methocarbamol (ROBAXIN) IV Stopped (11/04/19 1348)  . vancomycin 750 mg (11/04/19 2206)   PRN Meds: acetaminophen **OR** acetaminophen, hydrOXYzine, ketorolac, loperamide, methocarbamol (ROBAXIN) IV, ondansetron  Allergies:   No Known Allergies  Social History:   Social History   Socioeconomic History  . Marital status: Single    Spouse name: Not on  file  . Number of children: Not on file  . Years of education: Not on file  . Highest education level: Not on file  Occupational History  . Not on file  Tobacco Use  . Smoking status: Former Smoker    Types: Cigarettes  . Smokeless tobacco: Never Used  Vaping Use  . Vaping Use: Never used  Substance and Sexual Activity  . Alcohol use: No  . Drug use: Yes    Types: Marijuana, IV    Comment: opiods- pain medication/heroin  . Sexual activity: Yes     Birth control/protection: None  Other Topics Concern  . Not on file  Social History Narrative  . Not on file   Social Determinants of Health   Financial Resource Strain:   . Difficulty of Paying Living Expenses: Not on file  Food Insecurity:   . Worried About Programme researcher, broadcasting/film/video in the Last Year: Not on file  . Ran Out of Food in the Last Year: Not on file  Transportation Needs:   . Lack of Transportation (Medical): Not on file  . Lack of Transportation (Non-Medical): Not on file  Physical Activity:   . Days of Exercise per Week: Not on file  . Minutes of Exercise per Session: Not on file  Stress:   . Feeling of Stress : Not on file  Social Connections:   . Frequency of Communication with Friends and Family: Not on file  . Frequency of Social Gatherings with Friends and Family: Not on file  . Attends Religious Services: Not on file  . Active Member of Clubs or Organizations: Not on file  . Attends Banker Meetings: Not on file  . Marital Status: Not on file  Intimate Partner Violence:   . Fear of Current or Ex-Partner: Not on file  . Emotionally Abused: Not on file  . Physically Abused: Not on file  . Sexually Abused: Not on file    Family History:   Family History  Problem Relation Age of Onset  . Valvular heart disease Maternal Grandmother   a   ROS:  Please see the history of present illness.  All other ROS reviewed and negative.     Physical Exam/Data:   Vitals:   11/05/19 0400 11/05/19 0520 11/05/19 0600 11/05/19 0700  BP: 105/60  116/76 115/77  Pulse: 94   100  Resp: (!) 44  (!) 42 (!) 30  Temp: 98.4 F (36.9 C)   98.9 F (37.2 C)  TempSrc: Oral   Axillary  SpO2: 100%   90%  Weight:  50.4 kg    Height:        Intake/Output Summary (Last 24 hours) at 11/05/2019 1016 Last data filed at 11/05/2019 0300 Gross per 24 hour  Intake 2000.99 ml  Output --  Net 2000.99 ml   Last 3 Weights 11/05/2019 11/04/2019 11/04/2019  Weight (lbs) 111  lb 1.8 oz 111 lb 1.8 oz 104 lb 8 oz  Weight (kg) 50.4 kg 50.4 kg 47.4 kg     VS:  BP 115/77 (BP Location: Left Arm)   Pulse 100   Temp 98.9 F (37.2 C) (Axillary)   Resp (!) 30   Ht 5\' 2"  (1.575 m)   Wt 50.4 kg   SpO2 90%   BMI 20.32 kg/m  , BMI Body mass index is 20.32 kg/m. GENERAL:  Cachectic and chronically ill-appearing HEENT: Pupils equal round and reactive, fundi not visualized, oral mucosa unremarkable NECK:  No jugular venous  distention, waveform within normal limits, carotid upstroke brisk and symmetric, no bruits LUNGS:  Clear to auscultation bilaterally HEART:  RRR.  PMI not displaced or sustained,S1 and S2 within normal limits, no S3, no S4, no clicks, no rubs, no murmurs ABD:  Flat, positive bowel sounds normal in frequency in pitch, no bruits, no rebound, no guarding, no midline pulsatile mass, no hepatomegaly, no splenomegaly EXT:  2 plus pulses throughout, no edema, no cyanosis no clubbing SKIN:  No rashes no nodules NEURO:  Cranial nerves II through XII grossly intact, motor grossly intact throughout PSYCH:  Cognitively intact, oriented to person place and time   EKG:  The EKG was personally reviewed and demonstrates:  11/04/19: Sinus tachycardia.  Rate 133 bpm.    Telemetry:  Telemetry was personally reviewed and demonstrates:  Sinus tachycardia.  PACs.   Relevant CV Studies:  Echo10/22/21:  1. Left ventricular ejection fraction, by estimation, is 60 to 65%. The  left ventricle has normal function. The left ventricle has no regional  wall motion abnormalities. Left ventricular diastolic parameters were  normal.  2. Right ventricular systolic function There is an ill defined mass  encompassing a large portion of the RV cavity. Recommend cardiac MRI for  further evaulation.. The right ventricular size is normal. There is normal  pulmonary artery systolic pressure.  The estimated right ventricular systolic pressure is 18.4 mmHg.  3. The mitral valve is  normal in structure. No evidence of mitral valve  regurgitation. No evidence of mitral stenosis.  4. Possible vegetation on TV leaflet. Consider TEE for further  evaulation. . The tricuspid valve is abnormal.  5. The aortic valve was not well visualized. Aortic valve regurgitation  is not visualized. No aortic stenosis is present.  6. The inferior vena cava is normal in size with greater than 50%  respiratory variability, suggesting right atrial pressure of 3  mmHg.Recommend Cardiac MRI to evaluated abnormality in RV cavity. ALso  ecoh concerning for TV endocardidits.  Laboratory Data:  High Sensitivity Troponin:   Recent Labs  Lab 11/03/19 2240  TROPONINIHS 14     Chemistry Recent Labs  Lab 11/03/19 0950 11/03/19 2240 11/04/19 0434  NA 128*  123* 131* 129*  K 4.2  4.1 3.8 3.5  CL 89*  86* 95* 96*  CO2 GLUCOSE 88  92 105* 134*  BUN 39*  39* 30* 22*  CREATININE 1.33*  1.33* 0.77 0.66  CALCIUM 8.2*  8.0* 7.9* 7.5*  GFRNONAA 56*  56* >60 >60  ANIONGAP 17*  Recent Labs  Lab 11/03/19 0950 11/03/19 0950 11/03/19 2240 11/04/19 0434 11/04/19 1550  PROT 7.5  --   --   --  6.0*  ALBUMIN 2.2*  2.2*   < > 1.8* 1.5* 1.7*  AST 84*  --   --   --  29  ALT 36  --   --   --  23  ALKPHOS 133*  --   --   --  103  BILITOT 1.0  --   --   --  0.8   < > = values in this interval not displayed.   Hematology Recent Labs  Lab 11/03/19 0950 11/04/19 0434  WBC 10.7* 10.3  RBC 4.49 3.48*  HGB 12.0 9.3*  HCT 35.6* 27.9*  MCV 79.3* 80.2  MCH 26.7 26.7  MCHC 33.7 33.3  RDW 14.4 14.3  PLT 173 193   BNPNo results for  input(s): BNP, PROBNP in the last 168 hours.  DDimer No results for input(s): DDIMER in the last 168 hours.   Radiology/Studies:  CT Angio Chest PE W and/or Wo Contrast  Result Date: 11/03/2019 CLINICAL DATA:  Hypotensive, heroin user EXAM: CT ANGIOGRAPHY CHEST WITH CONTRAST TECHNIQUE: Multidetector CT imaging of the chest was  performed using the standard protocol during bolus administration of intravenous contrast. Multiplanar CT image reconstructions and MIPs were obtained to evaluate the vascular anatomy. CONTRAST:  OMNIPAQUE IOHEXOL 350 MG/ML SOLN COMPARISON:  None. FINDINGS: Cardiovascular: Satisfactory opacification of the pulmonary arteries to the segmental level. No evidence of pulmonary embolism. Normal heart size. No pericardial effusion. Thoracic aorta is normal in caliber. Mediastinum/Nodes: Mildly enlarged mediastinal and hilar lymph nodes are likely reactive. Thyroid and esophagus are unremarkable. Lungs/Pleura: Numerous nodules are present bilaterally with some demonstrating internal cavitation. Mild interlobular septal thickening. Mild bibasilar atelectasis. No pleural effusion or pneumothorax. Upper Abdomen: No acute abnormality. Musculoskeletal: Osseous structures are unremarkable. Review of the MIP images confirms the above findings. IMPRESSION: No acute pulmonary embolism. Pulmonary findings most consistent with septic emboli. Minor interstitial pulmonary edema. These results were called by telephone at the time of interpretation on 11/03/2019 at 11:56 am to provider ADAM CURATOLO , who verbally acknowledged these results. Electronically Signed   By: Guadlupe Spanish M.D.   On: 11/03/2019 12:01   MR BRAIN WO CONTRAST  Result Date: 11/04/2019 CLINICAL DATA:  Bacteremia.  Rule out septic emboli. EXAM: MRI HEAD WITHOUT CONTRAST TECHNIQUE: Multiplanar, multiecho pulse sequences of the brain and surrounding structures were obtained without intravenous contrast. COMPARISON:  Head CT January 18, 2018. FINDINGS: Brain: No acute infarction, hemorrhage, hydrocephalus, extra-axial collection or mass lesion. The brain parenchyma has normal morphology and signal characteristics. Vascular: Normal flow voids. Skull and upper cervical spine: Diffuse decrease of the T1 signal within the visualized upper cervical spine,  calvarium and mandible may represent red marrow reconversion. Correlate with CBC. Sinuses/Orbits: Minimal mucosal thickening of the right maxillary sinus. The orbits are maintained. Other: None. IMPRESSION: 1. No acute intracranial abnormality. Unremarkable MRI of the brain. 2. Diffuse decrease of the T1 signal within the visualized upper cervical spine, calvarium and mandible may represent red marrow reconversion. Correlate with CBC. Electronically Signed   By: Baldemar Lenis M.D.   On: 11/04/2019 20:52   MR THORACIC SPINE WO CONTRAST  Result Date: 11/04/2019 CLINICAL DATA:  Bacteremia.  Back pain. EXAM: MRI THORACIC AND LUMBAR SPINE WITHOUT CONTRAST TECHNIQUE: Multiplanar and multiecho pulse sequences of the thoracic and lumbar spine were obtained without intravenous contrast. The patient refused contrast administration. COMPARISON:  Chest CT yesterday. FINDINGS: MRI THORACIC SPINE FINDINGS Alignment:  Normal Vertebrae: Normal Cord:  Normal Paraspinal and other soft tissues: Small effusions layering dependently, right more than left, with dependent atelectasis in the lower lobes right more than left. Patchy infiltrates as were shown by CT. Disc levels: No degenerative disc disease. No stenosis of the canal or foramina. No evidence of discitis osteomyelitis or infected facet joint. MRI LUMBAR SPINE FINDINGS Segmentation:  5 lumbar type vertebral bodies. Alignment:  Normal Vertebrae:  Vertebral bodies are normal. Conus medullaris: Extends to the L1-2 level and appears normal. Paraspinal and other soft tissues: Negative Disc levels: No evidence of degenerative disc disease or disc space infection. Patient has some edema associated with the facet joints at L4-5, without frank joint effusions. This could be degenerative in nature or could reflect the earliest changes of facet joint infection.  IMPRESSION: 1. No evidence of discitis osteomyelitis or infected facet joint in the thoracic region. 2. No  evidence of degenerative disc disease or disc space infection in the lumbar region. There is some edema associated with the facet joints at L4-5, without frank joint effusions. This could be degenerative in nature or could reflect the earliest changes of facet joint infection. 3. Small pleural effusions layering dependently, right more than left, with dependent atelectasis in the lower lobes right more than left. Focal pulmonary infiltrates as shown by CT yesterday. Electronically Signed   By: Paulina FusiMark  Shogry M.D.   On: 11/04/2019 15:34   MR LUMBAR SPINE WO CONTRAST  Result Date: 11/04/2019 CLINICAL DATA:  Bacteremia.  Back pain. EXAM: MRI THORACIC AND LUMBAR SPINE WITHOUT CONTRAST TECHNIQUE: Multiplanar and multiecho pulse sequences of the thoracic and lumbar spine were obtained without intravenous contrast. The patient refused contrast administration. COMPARISON:  Chest CT yesterday. FINDINGS: MRI THORACIC SPINE FINDINGS Alignment:  Normal Vertebrae: Normal Cord:  Normal Paraspinal and other soft tissues: Small effusions layering dependently, right more than left, with dependent atelectasis in the lower lobes right more than left. Patchy infiltrates as were shown by CT. Disc levels: No degenerative disc disease. No stenosis of the canal or foramina. No evidence of discitis osteomyelitis or infected facet joint. MRI LUMBAR SPINE FINDINGS Segmentation:  5 lumbar type vertebral bodies. Alignment:  Normal Vertebrae:  Vertebral bodies are normal. Conus medullaris: Extends to the L1-2 level and appears normal. Paraspinal and other soft tissues: Negative Disc levels: No evidence of degenerative disc disease or disc space infection. Patient has some edema associated with the facet joints at L4-5, without frank joint effusions. This could be degenerative in nature or could reflect the earliest changes of facet joint infection. IMPRESSION: 1. No evidence of discitis osteomyelitis or infected facet joint in the thoracic  region. 2. No evidence of degenerative disc disease or disc space infection in the lumbar region. There is some edema associated with the facet joints at L4-5, without frank joint effusions. This could be degenerative in nature or could reflect the earliest changes of facet joint infection. 3. Small pleural effusions layering dependently, right more than left, with dependent atelectasis in the lower lobes right more than left. Focal pulmonary infiltrates as shown by CT yesterday. Electronically Signed   By: Paulina FusiMark  Shogry M.D.   On: 11/04/2019 15:34   CT ANKLE RIGHT W CONTRAST  Result Date: 11/04/2019 CLINICAL DATA:  Foot swelling bacteremia EXAM: CT OF THE RIGHT ANKLE WITH CONTRAST TECHNIQUE: Multidetector CT imaging of the right ankle was performed following the standard protocol during bolus administration of intravenous contrast. CONTRAST:  100mL OMNIPAQUE IOHEXOL 300 MG/ML  SOLN COMPARISON:  None. FINDINGS: Bones/Joint/Cartilage No fracture or dislocation. No areas of cortical destruction or periosteal reaction are seen. The articular surfaces appear maintained. No large ankle joint effusion. Ligaments Suboptimally assessed by CT. Muscles and Tendons The muscles surrounding the ankle and foot appear to be intact without focal atrophy or tear. The flexor and extensor tendons are intact. The Achilles tendon is intact. Soft tissues: There is subcutaneous edema seen along the posterior heel pad with mild skin thickening. There also appears to be mildly heterogeneous density with thickening in the middle cord of the plantar fascia, however it is intact. No loculated fluid collections are noted. IMPRESSION: Findings which could be suggestive of diffuse cellulitis along the heel pad. No loculated fluid collections or definite evidence of osteomyelitis. Electronically Signed   By: Kandis FantasiaBindu  Avutu M.D.   On: 11/04/2019 21:45   CT FOOT LEFT W CONTRAST  Result Date: 11/04/2019 CLINICAL DATA:  Foot swelling bacteremia  EXAM: CT OF THE RIGHT ANKLE WITH CONTRAST TECHNIQUE: Multidetector CT imaging of the right ankle was performed following the standard protocol during bolus administration of intravenous contrast. CONTRAST:  OMNIPAQUE IOHEXOL 300 MG/ML  SOLN COMPARISON:  None. FINDINGS: Bones/Joint/Cartilage No fracture or dislocation. No areas of cortical destruction or periosteal reaction are seen. The articular surfaces appear maintained. No large ankle joint effusion. Ligaments Suboptimally assessed by CT. Muscles and Tendons The muscles surrounding the ankle and foot appear to be intact without focal atrophy or tear. The flexor and extensor tendons are intact. The Achilles tendon is intact. Soft tissues: There is subcutaneous edema seen along the posterior heel pad with mild skin thickening. There also appears to be mildly heterogeneous density with thickening in the middle cord of the plantar fascia, however it is intact. No loculated fluid collections are noted. IMPRESSION: Findings which could be suggestive of diffuse cellulitis along the heel pad. No loculated fluid collections or definite evidence of osteomyelitis. Electronically Signed   By: Jonna Clark M.D.   On: 11/04/2019 21:45   DG Chest Port 1 View  Result Date: 11/03/2019 CLINICAL DATA:  Sepsis, IV drug use EXAM: PORTABLE CHEST 1 VIEW COMPARISON:  09/07/2014 FINDINGS: Since the prior examination, there have developed multiple peripherally distributed pulmonary nodules within the lungs bilaterally. Given the patient's history of drug use, septic embolization should be considered. Atypical infection, such as fungal pneumonia, or metastatic disease are considered less likely, but could present similarly. No confluent pulmonary infiltrates. Cardiac size within normal limits. Pulmonary vascularity is normal. No acute bone abnormality. IMPRESSION: Interval development of multiple pulmonary nodules. This could be better assessed with CT examination, if  indicated. Electronically Signed   By: Helyn Numbers MD   On: 11/03/2019 08:58   ECHOCARDIOGRAM COMPLETE  Result Date: 11/04/2019    ECHOCARDIOGRAM REPORT   Patient Name:   AMORA SHEEHY Date of Exam: 11/04/2019 Medical Rec #:  [id number]        Height:       62.0 in Accession #:    [id number]       Weight:       105.0 lb Date of Birth:  1990-01-15         BSA:          1.454 m Patient Age:    29 years         BP:           95/58 mmHg Patient Gender: F                HR:           97 bpm. Exam Location:  Inpatient Procedure: 2D Echo Indications:    Bacteremia  History:        Patient has no prior history of Echocardiogram examinations.                 Risk Factors:IV Drug abuse.  Sonographer:    Thurman Coyer RDCS (AE) Referring Phys: [id number] Teddy Spike IMPRESSIONS  1. Left ventricular ejection fraction, by estimation, is 60 to 65%. The left ventricle has normal function. The left ventricle has no regional wall motion abnormalities. Left ventricular diastolic parameters were normal.  2. Right ventricular systolic function There is an ill defined mass encompassing a large portion of the RV cavity. Recommend cardiac  MRI for further evaulation.. The right ventricular size is normal. There is normal pulmonary artery systolic pressure. The estimated right ventricular systolic pressure is 18.4 mmHg.  3. The mitral valve is normal in structure. No evidence of mitral valve regurgitation. No evidence of mitral stenosis.  4. Possible vegetation on TV leaflet. Consider TEE for further evaulation. . The tricuspid valve is abnormal.  5. The aortic valve was not well visualized. Aortic valve regurgitation is not visualized. No aortic stenosis is present.  6. The inferior vena cava is normal in size with greater than 50% respiratory variability, suggesting right atrial pressure of 3 mmHg.Recommend Cardiac MRI to evaluated abnormality in RV cavity. ALso ecoh concerning for TV endocardidits. FINDINGS  Left Ventricle:  Left ventricular ejection fraction, by estimation, is 60 to 65%. The left ventricle has normal function. The left ventricle has no regional wall motion abnormalities. The left ventricular internal cavity size was normal in size. There is  no left ventricular hypertrophy. Left ventricular diastolic parameters were normal. Normal left ventricular filling pressure. Right Ventricle: The right ventricular size is normal. No increase in right ventricular wall thickness. Right ventricular systolic function There is an ill defined mass encompassing a large portion of the RV cavity. Recommend cardiac MRI for further evaulation. There is normal pulmonary artery systolic pressure. The tricuspid regurgitant velocity is 1.96 m/s, and with an assumed right atrial pressure of 3 mmHg, the estimated right ventricular systolic pressure is 18.4 mmHg. Left Atrium: Left atrial size was normal in size. Right Atrium: Right atrial size was normal in size. Pericardium: There is no evidence of pericardial effusion. Mitral Valve: The mitral valve is normal in structure. No evidence of mitral valve regurgitation. No evidence of mitral valve stenosis. Tricuspid Valve: Possible vegetation on TV leaflet. Consider TEE for further evaulation. The tricuspid valve is abnormal. Tricuspid valve regurgitation is mild . No evidence of tricuspid stenosis. Aortic Valve: The aortic valve was not well visualized. Aortic valve regurgitation is not visualized. No aortic stenosis is present. Pulmonic Valve: The pulmonic valve was normal in structure. Pulmonic valve regurgitation is not visualized. No evidence of pulmonic stenosis. Aorta: The aortic root is normal in size and structure. Venous: The inferior vena cava is normal in size with greater than 50% respiratory variability, suggesting right atrial pressure of 3 mmHg. IAS/Shunts: No atrial level shunt detected by color flow Doppler.  LEFT VENTRICLE PLAX 2D LVIDd:         3.59 cm  Diastology LVIDs:          2.31 cm  LV e' medial:    9.57 cm/s LV PW:         0.61 cm  LV E/e' medial:  7.8 LV IVS:        0.80 cm  LV e' lateral:   8.70 cm/s LVOT diam:     2.10 cm  LV E/e' lateral: 8.5 LV SV:         55 LV SV Index:   38 LVOT Area:     3.46 cm  RIGHT VENTRICLE RV S prime:     12.70 cm/s TAPSE (M-mode): 2.0 cm LEFT ATRIUM             Index       RIGHT ATRIUM           Index LA diam:        1.60 cm 1.10 cm/m  RA Area:     13.70 cm LA Vol (A2C):   31.6  ml 21.74 ml/m RA Volume:   32.10 ml  22.08 ml/m LA Vol (A4C):   20.8 ml 14.31 ml/m LA Biplane Vol: 26.7 ml 18.37 ml/m  AORTIC VALVE LVOT Vmax:   90.90 cm/s LVOT Vmean:  55.500 cm/s LVOT VTI:    0.158 m  AORTA Ao Root diam: 2.70 cm MITRAL VALVE               TRICUSPID VALVE MV Area (PHT): 3.37 cm    TR Peak grad:   15.4 mmHg MV Decel Time: 225 msec    TR Vmax:        196.00 cm/s MV E velocity: 74.20 cm/s MV A velocity: 60.60 cm/s  SHUNTS MV E/A ratio:  1.22        Systemic VTI:  0.16 m                            Systemic Diam: 2.10 cm Armanda Magic MD Electronically signed by Armanda Magic MD Signature Date/Time: 11/04/2019/2:03:31 PM    Final      Assessment and Plan:   # MRSA endocarditis:  # 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  I personally reviewed her echo.  There does appear to be a small tricuspid valve vegetation.  There is only mild tricuspid vegetation.  Given her cachexia and IV drug use, would be cautious about proceeding with surgery for this.  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  Agree with putting her on IV heparin, especially given that there was no emboli on her brain MRI.  If she has any respiratory compromise over the weekend, likely from PE and would require Angio Vac, though her likely TV vegetation makes this more risky.  Will plan for TEE on Monday.  Continue IV heparin and IV  antibiotics per primary team and ID.  We will continue to follow.  Daily EKG for conduction assessment.    For questions or updates, please contact [facility] Please consult [email or web address] for contact info under    Signed, Chilton Si, MD  11/05/2019 10:16 AM

## 2019-11-06 DIAGNOSIS — I330 Acute and subacute infective endocarditis: Secondary | ICD-10-CM

## 2019-11-06 LAB — BASIC METABOLIC PANEL WITH GFR
Anion gap: 8 (ref 5–15)
BUN: 9 mg/dL (ref 6–20)
CO2: 20 mmol/L — ABNORMAL LOW (ref 22–32)
Calcium: 7.5 mg/dL — ABNORMAL LOW (ref 8.9–10.3)
Chloride: 103 mmol/L (ref 98–111)
Creatinine, Ser: 0.55 mg/dL (ref 0.44–1.00)
GFR, Estimated: >60 mL/min
Glucose, Bld: 117 mg/dL — ABNORMAL HIGH (ref 70–99)
Potassium: 3.3 mmol/L — ABNORMAL LOW (ref 3.5–5.1)
Sodium: 131 mmol/L — ABNORMAL LOW (ref 135–145)

## 2019-11-06 LAB — CBC
HCT: 24.5 % — ABNORMAL LOW (ref 36.0–46.0)
Hemoglobin: 8.2 g/dL — ABNORMAL LOW (ref 12.0–15.0)
MCH: 26.4 pg (ref 26.0–34.0)
MCHC: 33.5 g/dL (ref 30.0–36.0)
MCV: 78.8 fL — ABNORMAL LOW (ref 80.0–100.0)
Platelets: 305 10*3/uL (ref 150–400)
RBC: 3.11 MIL/uL — ABNORMAL LOW (ref 3.87–5.11)
RDW: 14.3 % (ref 11.5–15.5)
WBC: 10.7 10*3/uL — ABNORMAL HIGH (ref 4.0–10.5)
nRBC: 0 % (ref 0.0–0.2)

## 2019-11-06 LAB — HEPARIN LEVEL (UNFRACTIONATED)
Heparin Unfractionated: <0.1 [IU]/mL — ABNORMAL LOW (ref 0.30–0.70)
Heparin Unfractionated: <0.1 IU/mL — ABNORMAL LOW (ref 0.30–0.70)

## 2019-11-06 LAB — MAGNESIUM: Magnesium: 1.8 mg/dL (ref 1.7–2.4)

## 2019-11-06 LAB — VANCOMYCIN, TROUGH: Vancomycin Tr: 5 ug/mL — ABNORMAL LOW (ref 15–20)

## 2019-11-06 MED ORDER — VANCOMYCIN HCL IN DEXTROSE 1-5 GM/200ML-% IV SOLN
1000.0000 mg | Freq: Three times a day (TID) | INTRAVENOUS | Status: DC
  Administered 2019-11-06 – 2019-11-07 (×4): 1000 mg via INTRAVENOUS
  Filled 2019-11-06 (×2): qty 200

## 2019-11-06 MED ORDER — HEPARIN BOLUS VIA INFUSION
1500.0000 [IU] | Freq: Once | INTRAVENOUS | Status: AC
  Administered 2019-11-06: 1500 [IU] via INTRAVENOUS
  Filled 2019-11-06: qty 1500

## 2019-11-06 MED ORDER — ALUM & MAG HYDROXIDE-SIMETH 200-200-20 MG/5ML PO SUSP
30.0000 mL | ORAL | Status: DC | PRN
  Filled 2019-11-06: qty 30

## 2019-11-06 MED ORDER — FENTANYL CITRATE (PF) 100 MCG/2ML IJ SOLN
12.5000 ug | INTRAMUSCULAR | Status: DC | PRN
  Administered 2019-11-06 – 2019-11-07 (×4): 12.5 ug via INTRAVENOUS
  Filled 2019-11-06 (×4): qty 2

## 2019-11-06 MED ORDER — SODIUM CHLORIDE 0.9 % IV SOLN: INTRAVENOUS | Status: DC

## 2019-11-06 MED ORDER — POTASSIUM CHLORIDE CRYS ER 20 MEQ PO TBCR
40.0000 meq | EXTENDED_RELEASE_TABLET | Freq: Once | ORAL | Status: AC
  Administered 2019-11-06: 40 meq via ORAL
  Filled 2019-11-06: qty 4

## 2019-11-06 MED ORDER — POTASSIUM CHLORIDE CRYS ER 20 MEQ PO TBCR: 40.0000 meq | EXTENDED_RELEASE_TABLET | Freq: Once | ORAL | Status: DC

## 2019-11-06 NOTE — Progress Notes (Signed)
PHARMACY - PHYSICIAN COMMUNICATION CRITICAL VALUE ALERT - BLOOD CULTURE IDENTIFICATION (BCID)  Amber [name] is an 28 y.o. female who presented to [facility] on 11/05/2019 with sepsis concerning for septic emboli, possible tricuspid valve vegetation and RV mass.  Assessment:  29 yo female presents 10/23 after previous admission on 10/21 in which she left AMA on 10/23. The patient was previously on vancomycin and cefepime in which cefepime was discontinued per ID. The patient is currently on vancomycin due to a previously positive MRSA result. Vancomycin trough on 10/23 resulted at 5 mcg/mL and the dosing was adjusted to 1000 mg q8h accordingly, taking into account the patient's age, weight and renal function. Patient is currently on recommended therapy for the BCID result.   Name of physician (or Provider) Contacted: Dr. Kathlen Mody  Current antibiotics: Vancomycin IV 1000 mg q8h  Changes to prescribed antibiotics recommended:  Patient is on recommended antibiotics - No changes needed  Results for orders placed or performed during the hospital encounter of 11/03/19  Blood Culture ID Panel (Reflexed) (Collected: 11/05/2019  8:29 AM)  Result Value Ref Range   Enterococcus faecalis NOT DETECTED NOT DETECTED   Enterococcus Faecium NOT DETECTED NOT DETECTED   Listeria monocytogenes NOT DETECTED NOT DETECTED   Staphylococcus species DETECTED (A) NOT DETECTED   Staphylococcus aureus (BCID) DETECTED (A) NOT DETECTED   Staphylococcus epidermidis PENDING NOT DETECTED   Staphylococcus lugdunensis PENDING NOT DETECTED   Streptococcus species NOT DETECTED NOT DETECTED   Streptococcus agalactiae NOT DETECTED NOT DETECTED   Streptococcus pneumoniae NOT DETECTED NOT DETECTED   Streptococcus pyogenes NOT DETECTED NOT DETECTED   A.calcoaceticus-baumannii NOT DETECTED NOT DETECTED   Bacteroides fragilis NOT DETECTED NOT DETECTED   Enterobacterales PENDING NOT DETECTED   Enterobacter cloacae complex  NOT DETECTED NOT DETECTED   Escherichia coli NOT DETECTED NOT DETECTED   Klebsiella aerogenes NOT DETECTED NOT DETECTED   Klebsiella oxytoca NOT DETECTED NOT DETECTED   Klebsiella pneumoniae NOT DETECTED NOT DETECTED   Proteus species NOT DETECTED NOT DETECTED   Salmonella species NOT DETECTED NOT DETECTED   Serratia marcescens NOT DETECTED NOT DETECTED   Haemophilus influenzae NOT DETECTED NOT DETECTED   Neisseria meningitidis NOT DETECTED NOT DETECTED   Pseudomonas aeruginosa NOT DETECTED NOT DETECTED   Stenotrophomonas maltophilia NOT DETECTED NOT DETECTED   Candida albicans NOT DETECTED NOT DETECTED   Candida auris NOT DETECTED NOT DETECTED   Candida glabrata NOT DETECTED NOT DETECTED   Candida krusei NOT DETECTED NOT DETECTED   Candida parapsilosis NOT DETECTED NOT DETECTED   Candida tropicalis NOT DETECTED NOT DETECTED   Cryptococcus neoformans/gattii NOT DETECTED NOT DETECTED   Meth resistant mecA/C and MREJ PENDING NOT DETECTED   Carbapenem resist OXA 48 LIKE PENDING NOT DETECTED   Carbapenem resistance VIM PENDING NOT DETECTED    Sanda Klein, PharmD, RPh  PGY-1 Pharmacy Resident 11/06/2019 1:21 PM  Please check [email or web address] for unit-specific pharmacy phone numbers.

## 2019-11-06 NOTE — Progress Notes (Signed)
ANTICOAGULATION CONSULT NOTE - Follow Up Consult  Pharmacy Consult for heparin Indication: RV mass  No Known Allergies  Patient Measurements: Weight: 48.7 kg (107 lb 5.8 oz) Heparin Dosing Weight: 50.4 kg  Vital Signs: Temp: 98.2 F (36.8 C) (10/24 0830) Temp Source: Oral (10/24 0830) BP: 117/73 (10/24 0830) Pulse Rate: 101 (10/24 0830)  Labs: Recent Labs    11/03/19 2240 11/03/19 2240 11/04/19 0434 11/04/19 0434 11/05/19 2014 11/06/19 0842  HGB  --   --  9.3*   < > 9.9* 8.2*  HCT  --   --  27.9*  --  30.8* 24.5*  PLT  --   --  193  --  346 305  LABPROT  --   --  18.0*  --   --   --   INR  --   --  1.5*  --   --   --   HEPARINUNFRC  --   --   --   --   --  <0.10*  CREATININE 0.77   < > 0.66  --  0.66 0.55  CKTOTAL 90  --   --   --   --   --   TROPONINIHS 14  --   --   --   --   --    < > = values in this interval not displayed.    Estimated Creatinine Clearance: 79.8 mL/min (by C-G formula based on SCr of 0.55 mg/dL).   Medications:  Scheduled:  . nicotine  21 mg Transdermal Daily  . sodium chloride flush  3 mL Intravenous Q12H   Infusions:  . sodium chloride 75 mL/hr at 11/06/19 0033  . sodium chloride    . heparin 850 Units/hr (11/05/19 2352)  . vancomycin Stopped (11/06/19 0024)   PRN: acetaminophen **OR** acetaminophen, alum & mag hydroxide-simeth, hydrOXYzine, ketorolac, ondansetron **OR** ondansetron (ZOFRAN) IV   Anti-infectives (From admission, onward)   Start     Dose/Rate Route Frequency Ordered Stop   11/05/19 2300  vancomycin (VANCOREADY) IVPB 750 mg/150 mL        750 mg 150 mL/hr over 60 Minutes Intravenous Every 12 hours 11/05/19 2217        Assessment: 29 yo female admitted with sepsis 2/2 MRSA bacteremia, cellulitis of left foot, and septic emboli found to have possible RV mass. Pharmacy has been consulted to start heparin.   PTA the patient was not on anticoagluation. 10/23 the patient left AMA but returned later in the day and was  readmitted. Pt was on prophylactic enox during the admission and Hgb dropped, platelets were stable and baseline INR was elevated at 1.5.  Heparin was restarted and the patient received a 3000 unit bolus followed by an 850 units/hr infusion. The heparin level this morning resulted subtherapeutic at <0.10. Hgb 8.2, platelets 305. There are no signs or symptoms of bleeding noted and no issues with the infusion per the RN.  Goal of Therapy:  Heparin level 0.3-0.7 units/ml Monitor platelets by anticoagulation protocol: Yes   Plan:  Give heparin IV 1500 unit bolus x1 followed by 1050 units/hr infusion Obtain 6-hr heparin level Monitor daily heparin level and CBC Monitor for signs and symptoms of bleeding   Sanda Klein, PharmD, RPh  PGY-1 Pharmacy Resident 11/06/2019 10:19 AM  Please check [email or web address] for unit-specific pharmacy phone numbers.

## 2019-11-06 NOTE — Progress Notes (Addendum)
Pharmacy Antibiotic Note  Amber [name] is a 29 y.o. female admitted on 11/05/2019 with sepsis.  Pharmacy has been consulted for vancomycin dosing.  Pt is a 29 yo female with PMH significant for IVDU, presenting with fatigue, weakness. BCID now positive for MRSA bacteremia. Left AMA 10/23 and returned the same day with complaints of back pain. WBC 10.7, LA 1.6, Scr 0.55 (CrCl 79.8 mL/min).  Last dose of vancomycin was on 10/23 @ 2322. Vancomycin trough resulted subtherapeutic at 5 mcg/mL. Due to the low trough along with the patient's age, weight and renal function, the vancomycin dose will be adjusted.   Plan: - Change vancomycin dosing to 1000 mg IV q8h - Check another vancomycin trough at steady state - Goal vancomycin trough 15-20 mcg/mL and/or AUC 400-550 - Monitor clinical status, renal function, cultures and length of therapy   Weight: 48.7 kg (107 lb 5.8 oz)  Temp (24hrs), Avg:98.6 F (37 C), Min:98 F (36.7 C), Max:99.2 F (37.3 C)  Recent Labs  Lab 11/03/19 0949 11/03/19 0950 11/03/19 1229 11/03/19 2134 11/03/19 2240 11/04/19 0434 11/05/19 2014 11/05/19 2147 11/06/19 0842  WBC  --  10.7*  --   --   --  10.3 12.7*  --  10.7*  CREATININE  --  1.33*  1.33*  --   --  0.77 0.66 0.66  --  0.55  LATICACIDVEN   < >  --  1.7 1.6 1.7  --  2.8* 1.6  --   VANCOTROUGH  --   --   --   --   --   --   --   --  5*   < > = values in this interval not displayed.    Estimated Creatinine Clearance: 79.8 mL/min (by C-G formula based on SCr of 0.55 mg/dL).    No Known Allergies  Antimicrobials: vancomycin 10/21 >>   Dose adjustments this admission: 10/24 vancomycin to 750 mg IV q12h --> 1000 mg IV q8h  Microbiology results: 10/23 BCx pending 10/21 Resp panel: neg 10/21 UCx: >100K E.coli 10/21 BCx: MRSA  Thank you for allowing pharmacy to be a part of this patient's care.  Sanda Klein, PharmD, RPh  PGY-1 Pharmacy Resident 11/06/2019 10:29 AM  Please check  [email or web address] for unit-specific pharmacy phone numbers.

## 2019-11-06 NOTE — Progress Notes (Signed)
PROGRESS NOTE    Amber [name]  ZOX:[id number] DOB: [birth year] DOA: 11/05/2019 PCP: Patient, No Pcp Per    Chief Complaint  Patient presents with  . Back Pain    Brief Narrative:  29 year old lady with prior history of MRSA bacteremia, endocarditis, septic emboli, right ventricular mass presents to ED last night after leaving AMA yesterday morning.  Patient has a history of IVDU initially came in for headache, fever tachycardia was found to have MRSA bacteremia.  CT chest showed septic emboli with left upper lobe cavitary nodule and small bilateral effusions.  Echocardiogram done showed ill-defined mass in the right ventricular cavity and possible vegetation on the tricuspid valve.  Cardiology, cardiothoracic surgery and infectious disease are on board. Patient was restarted on vancomycin and IV heparin and she is scheduled for TEE tomorrow.  Assessment & Plan:   Principal Problem:   Bacterial endocarditis Active Problems:   IV drug abuse (HCC)   Sepsis (HCC)   MRSA bacteremia   Septic embolism (HCC)   Hyponatremia   Right ventricular mass   Sepsis secondary to MRSA bacteremia/septic emboli We started the patient on IV heparin and IV vancomycin.  Repeat blood cultures were positive for MRSA.  She is scheduled for TEE tomorrow Cardiothoracic surgery on board. Continue with Toradol and Robaxin for pain.    Polysubstance abuse TOC will be consulted for resources and counseling.    Mild hyper natremia Continue to monitor.   Hypokalemia Replaced.   Malnutrition secondary to acute illness Nutritionist will be consulted.  DVT prophylaxis: Heparin GTT Code Status: (Full code Family Communication: None at bedside Disposition:   Status is: Inpatient  Remains inpatient appropriate because:IV treatments appropriate due to intensity of illness or inability to take PO   Dispo: The patient is from: Home              Anticipated d/c is to: Home               Anticipated d/c date is: > 3 days              Patient currently is not medically stable to d/c.       Consultants:   Cardiology  Cardiothoracic surgery  Infectious disease  Procedures: TEE scheduled tomorrow   antimicrobials: Vancomycin  Subjective: Patient reports she made a mistake yesterday by leaving the hospital AMA and she will not do that again.  She denies any nausea vomiting or abdominal pain at this time  Objective: Vitals:   11/06/19 0012 11/06/19 0326 11/06/19 0830 11/06/19 1213  BP: 109/76 105/67 117/73   Pulse: (!) 116 (!) 115 (!) 101   Resp: (!) Temp: 98.7 F (37.1 C) 98 F (36.7 C) 98.2 F (36.8 C) 98.2 F (36.8 C)  TempSrc: Oral Oral Oral Oral  SpO2: 100% 98%    Weight: 48.7 kg       Intake/Output Summary (Last 24 hours) at 11/06/2019 1525 Last data filed at 11/06/2019 0840 Gross per 24 hour  Intake 363 ml  Output --  Net 363 ml   Filed Weights   11/06/19 0012  Weight: 48.7 kg    Examination:  General exam: Appears calm and comfortable  Respiratory system: Clear to auscultation. Respiratory effort normal. Cardiovascular system: S1 & S2 heard, RRR. No JVD, . No pedal edema. Gastrointestinal system: Abdomen is nondistended, soft and nontender.. Normal bowel sounds heard. Central nervous system: Alert and oriented. No focal neurological deficits. Extremities: Symmetric 5  x 5 power. Skin: No rashes, lesions or ulcers Psychiatry:Mood & affect appropriate.     Data Reviewed: I have personally reviewed following labs and imaging studies  CBC: Recent Labs  Lab 11/03/19 0950 11/04/19 0434 11/05/19 2014 11/06/19 0842  WBC 10.7* 10.3 12.7* 10.7*  NEUTROABS 9.3*  --  11.0*  --   HGB 12.0 9.3* 9.9* 8.2*  HCT 35.6* 27.9* 30.8* 24.5*  MCV 79.3* 80.2 81.3 78.8*  PLT 173 193 346 305    Basic Metabolic Panel: Recent Labs  Lab 11/03/19 0950 11/03/19 2240 11/04/19 0434 11/05/19 2014 11/06/19 0842  NA 128*  123* 131*  129* 131* 131*  K 4.2  4.1 3.8 3.5 3.4* 3.3*  CL 89*  86* 95* 96* 99 103  CO2 22  22 25 24  20* 20*  GLUCOSE 88  92 105* 134* 148* 117*  BUN 39*  39* 30* 22* 8 9  CREATININE 1.33*  1.33* 0.77 0.66 0.66 0.55  CALCIUM 8.2*  8.0* 7.9* 7.5* 8.3* 7.5*  MG  --   --   --   --  1.8  PHOS 4.2 3.6 2.6  --   --     GFR: Estimated Creatinine Clearance: 79.8 mL/min (by C-G formula based on SCr of 0.55 mg/dL).  Liver Function Tests: Recent Labs  Lab 11/03/19 0950 11/03/19 2240 11/04/19 0434 11/04/19 1550 11/05/19 2014  AST 84*  --   --  29 32  ALT 36  --   --  23 22  ALKPHOS 133*  --   --  103 128*  BILITOT 1.0  --   --  0.8 0.4  PROT 7.5  --   --  6.0* 6.9  ALBUMIN 2.2*  2.2* 1.8* 1.5* 1.7* 1.8*    CBG: No results for input(s): GLUCAP in the last 168 hours.   Recent Results (from the past 240 hour(s))  Urine culture     Status: Abnormal   Collection Time: 11/03/19  8:18 AM   Specimen: In/Out Cath Urine  Result Value Ref Range Status   Specimen Description   Final    IN/OUT CATH URINE Performed at [facility], [address], [place], [place] [address]    Special Requests   Final    NONE Performed at [facility], [address] [place], [place] [address]    Culture >=100,000 COLONIES/mL ESCHERICHIA COLI (A)  Final   Report Status 11/05/2019 FINAL  Final   Organism ID, Bacteria ESCHERICHIA COLI (A)  Final      Susceptibility   Escherichia coli - MIC*    AMPICILLIN 4 SENSITIVE Sensitive     CEFAZOLIN <=4 SENSITIVE Sensitive     CEFTRIAXONE <=0.25 SENSITIVE Sensitive     CIPROFLOXACIN <=0.25 SENSITIVE Sensitive     GENTAMICIN <=1 SENSITIVE Sensitive     IMIPENEM <=0.25 SENSITIVE Sensitive     NITROFURANTOIN <=16 SENSITIVE Sensitive     TRIMETH/SULFA <=20 SENSITIVE Sensitive     AMPICILLIN/SULBACTAM <=2 SENSITIVE Sensitive     PIP/TAZO <=4 SENSITIVE Sensitive     * >=100,000 COLONIES/mL ESCHERICHIA COLI  Respiratory Panel  by RT PCR (Flu A&B, Covid) - Nasopharyngeal Swab     Status: None   Collection Time: 11/03/19  9:13 AM   Specimen: Nasopharyngeal Swab  Result Value Ref Range Status   SARS Coronavirus 2 by RT PCR NEGATIVE NEGATIVE Final    Comment: (NOTE) SARS-CoV-2 target nucleic acids are NOT DETECTED.  The SARS-CoV-2 RNA is generally  detectable in upper respiratoy specimens during the acute phase of infection. The lowest concentration of SARS-CoV-2 viral copies this assay can detect is 131 copies/mL. A negative result does not preclude SARS-Cov-2 infection and should not be used as the sole basis for treatment or other patient management decisions. A negative result may occur with  improper specimen collection/handling, submission of specimen other than nasopharyngeal swab, presence of viral mutation(s) within the areas targeted by this assay, and inadequate number of viral copies (<131 copies/mL). A negative result must be combined with clinical observations, patient history, and epidemiological information. The expected result is Negative.  Fact Sheet for Patients:  https://www.moore.com/  Fact Sheet for Healthcare Providers:  https://www.young.biz/  This test is no t yet approved or cleared by the [place] FDA and  has been authorized for detection and/or diagnosis of SARS-CoV-2 by FDA under an Emergency Use Authorization (EUA). This EUA will remain  in effect (meaning this test can be used) for the duration of the COVID-19 declaration under Section 564(b)(1) of the Act, 21 U.S.C. section 360bbb-3(b)(1), unless the authorization is terminated or revoked sooner.     Influenza A by PCR NEGATIVE NEGATIVE Final   Influenza B by PCR NEGATIVE NEGATIVE Final    Comment: (NOTE) The Xpert Xpress SARS-CoV-2/FLU/RSV assay is intended as an aid in  the diagnosis of influenza from Nasopharyngeal swab specimens and  should not be used as a sole basis for  treatment. Nasal washings and  aspirates are unacceptable for Xpert Xpress SARS-CoV-2/FLU/RSV  testing.  Fact Sheet for Patients: https://www.moore.com/  Fact Sheet for Healthcare Providers: https://www.young.biz/  This test is not yet approved or cleared by the [place] FDA and  has been authorized for detection and/or diagnosis of SARS-CoV-2 by  FDA under an Emergency Use Authorization (EUA). This EUA will remain  in effect (meaning this test can be used) for the duration of the  Covid-19 declaration under Section 564(b)(1) of the Act, 21  U.S.C. section 360bbb-3(b)(1), unless the authorization is  terminated or revoked. Performed at [facility], [address] [place], [place] [address]   Blood Culture (routine x 2)     Status: Abnormal   Collection Time: 11/03/19  9:50 AM   Specimen: BLOOD RIGHT HAND  Result Value Ref Range Status   Specimen Description   Final    BLOOD RIGHT HAND Performed at [facility], [address] [place], [place] [address]    Special Requests   Final    BOTTLES DRAWN AEROBIC ONLY Blood Culture adequate volume Performed at [facility] [address], [place], [place] [address]    Culture  Setup Time   Final    AEROBIC BOTTLE ONLY GRAM POSITIVE COCCI CRITICAL VALUE NOTED.  VALUE IS CONSISTENT WITH PREVIOUSLY REPORTED AND CALLED VALUE.    Culture (A)  Final    STAPHYLOCOCCUS AUREUS SUSCEPTIBILITIES PERFORMED ON PREVIOUS CULTURE WITHIN THE LAST 5 DAYS. Performed at [facility], [address], [place], [place] [address]    Report Status 11/05/2019 FINAL  Final  Blood Culture (routine x 2)     Status: Abnormal   Collection Time: 11/03/19  9:50 AM   Specimen: BLOOD  Result Value Ref Range Status   Specimen Description   Final    BLOOD LEFT ARM Performed at [facility], [address], [place], [place] [address]     Special Requests   Final    BOTTLES DRAWN AEROBIC  AND ANAEROBIC Blood Culture adequate volume Performed at [facility], [address] [place], [place] [address]    Culture  Setup Time   Final    IN BOTH AEROBIC AND ANAEROBIC BOTTLES GRAM POSITIVE COCCI CRITICAL VALUE NOTED.  VALUE IS CONSISTENT WITH PREVIOUSLY REPORTED AND CALLED VALUE.    Culture (A)  Final    STAPHYLOCOCCUS AUREUS SUSCEPTIBILITIES PERFORMED ON PREVIOUS CULTURE WITHIN THE LAST 5 DAYS. Performed at [facility], [address] [place], [place] [address]    Report Status 11/05/2019 FINAL  Final  Culture, blood (Routine X 2) w Reflex to ID Panel     Status: Abnormal   Collection Time: 11/03/19  9:52 AM   Specimen: BLOOD  Result Value Ref Range Status   Specimen Description   Final    BLOOD RIGHT ARM Performed at [facility], [address], [place], [place] [address]    Special Requests   Final    BOTTLES DRAWN AEROBIC ONLY Blood Culture adequate volume Performed at [facility], [address], [place], [place] [address]    Culture  Setup Time   Final    AEROBIC BOTTLE ONLY GRAM POSITIVE COCCI Organism ID to follow CRITICAL RESULT CALLED TO, READ BACK BY AND VERIFIED WITH:  PHARMD E JACKSON 11/03/19 AT 1157 SK Performed at [facility], [address], [place], [place] [address]    Culture METHICILLIN RESISTANT STAPHYLOCOCCUS AUREUS (A)  Final   Report Status 11/05/2019 FINAL  Final   Organism ID, Bacteria METHICILLIN RESISTANT STAPHYLOCOCCUS AUREUS  Final      Susceptibility   Methicillin resistant staphylococcus aureus - MIC*    CIPROFLOXACIN >=8 RESISTANT Resistant     ERYTHROMYCIN >=8 RESISTANT Resistant     GENTAMICIN <=0.5 SENSITIVE Sensitive     OXACILLIN >=4 RESISTANT Resistant     TETRACYCLINE <=1 SENSITIVE Sensitive     VANCOMYCIN <=0.5 SENSITIVE Sensitive     TRIMETH/SULFA 160 RESISTANT Resistant     CLINDAMYCIN >=8  RESISTANT Resistant     RIFAMPIN <=0.5 SENSITIVE Sensitive     Inducible Clindamycin NEGATIVE Sensitive     * METHICILLIN RESISTANT STAPHYLOCOCCUS AUREUS  Blood Culture ID Panel (Reflexed)     Status: Abnormal   Collection Time: 11/03/19  9:52 AM  Result Value Ref Range Status   Enterococcus faecalis NOT DETECTED NOT DETECTED Final   Enterococcus Faecium NOT DETECTED NOT DETECTED Final   Listeria monocytogenes NOT DETECTED NOT DETECTED Final   Staphylococcus species DETECTED (A) NOT DETECTED Final    Comment: CRITICAL RESULT CALLED TO, READ BACK BY AND VERIFIED WITH: PHARMD E JACKSON 11/03/19 AT 2357 SK    Staphylococcus aureus (BCID) DETECTED (A) NOT DETECTED Final    Comment: Methicillin (oxacillin)-resistant Staphylococcus aureus (MRSA). MRSA is predictably resistant to beta-lactam antibiotics (except ceftaroline). Preferred therapy is vancomycin unless clinically contraindicated. Patient requires contact precautions if  hospitalized. CRITICAL RESULT CALLED TO, READ BACK BY AND VERIFIED WITH: PHARMD E JACKSON 11/03/19 AT 2357 SK    Staphylococcus epidermidis NOT DETECTED NOT DETECTED Final   Staphylococcus lugdunensis NOT DETECTED NOT DETECTED Final   Streptococcus species NOT DETECTED NOT DETECTED Final   Streptococcus agalactiae NOT DETECTED NOT DETECTED Final   Streptococcus pneumoniae NOT DETECTED NOT DETECTED Final   Streptococcus pyogenes NOT DETECTED NOT DETECTED Final   A.calcoaceticus-baumannii NOT DETECTED NOT DETECTED Final   Bacteroides fragilis NOT DETECTED NOT DETECTED Final   Enterobacterales NOT DETECTED NOT DETECTED  Final   Enterobacter cloacae complex NOT DETECTED NOT DETECTED Final   Escherichia coli NOT DETECTED NOT DETECTED Final   Klebsiella aerogenes NOT DETECTED NOT DETECTED Final   Klebsiella oxytoca NOT DETECTED NOT DETECTED Final   Klebsiella pneumoniae NOT DETECTED NOT DETECTED Final   Proteus species NOT DETECTED NOT DETECTED Final   Salmonella  species NOT DETECTED NOT DETECTED Final   Serratia marcescens NOT DETECTED NOT DETECTED Final   Haemophilus influenzae NOT DETECTED NOT DETECTED Final   Neisseria meningitidis NOT DETECTED NOT DETECTED Final   Pseudomonas aeruginosa NOT DETECTED NOT DETECTED Final   Stenotrophomonas maltophilia NOT DETECTED NOT DETECTED Final   Candida albicans NOT DETECTED NOT DETECTED Final   Candida auris NOT DETECTED NOT DETECTED Final   Candida glabrata NOT DETECTED NOT DETECTED Final   Candida krusei NOT DETECTED NOT DETECTED Final   Candida parapsilosis NOT DETECTED NOT DETECTED Final   Candida tropicalis NOT DETECTED NOT DETECTED Final   Cryptococcus neoformans/gattii NOT DETECTED NOT DETECTED Final   Meth resistant mecA/C and MREJ DETECTED (A) NOT DETECTED Final    Comment: CRITICAL RESULT CALLED TO, READ BACK BY AND VERIFIED WITH: PHARMD E JACKSON 11/03/19 AT 2357 SK Performed at [facility], [address] [place], [place] [address]   Culture, blood (Routine X 2) w Reflex to ID Panel     Status: None (Preliminary result)   Collection Time: 11/05/19  8:29 AM   Specimen: BLOOD  Result Value Ref Range Status   Specimen Description BLOOD RIGHT ANTECUBITAL  Final   Special Requests   Final    BOTTLES DRAWN AEROBIC ONLY Blood Culture adequate volume   Culture  Setup Time   Final    GRAM POSITIVE COCCI IN CLUSTERS AEROBIC BOTTLE ONLY Organism ID to follow CRITICAL RESULT CALLED TO, READ BACK BY AND VERIFIED WITH: J. MILLEN PHARMD, AT 1124 11/06/19 Performed at [facility], [address] [place], [place] [address]    Culture GRAM POSITIVE COCCI  Final   Report Status PENDING  Incomplete  Culture, blood (Routine X 2) w Reflex to ID Panel     Status: None (Preliminary result)   Collection Time: 11/05/19  8:29 AM   Specimen: BLOOD RIGHT HAND  Result Value Ref Range Status   Specimen Description BLOOD RIGHT HAND  Final   Special Requests   Final    BOTTLES DRAWN AEROBIC ONLY  Blood Culture adequate volume   Culture  Setup Time   Final    GRAM POSITIVE COCCI IN CLUSTERS AEROBIC BOTTLE ONLY CRITICAL VALUE NOTED.  VALUE IS CONSISTENT WITH PREVIOUSLY REPORTED AND CALLED VALUE. Performed at [facility], [address] [place], [place] [address]    Culture GRAM POSITIVE COCCI  Final   Report Status PENDING  Incomplete  Blood Culture ID Panel (Reflexed)     Status: Abnormal (Preliminary result)   Collection Time: 11/05/19  8:29 AM  Result Value Ref Range Status   Enterococcus faecalis NOT DETECTED NOT DETECTED Final   Enterococcus Faecium NOT DETECTED NOT DETECTED Final   Listeria monocytogenes NOT DETECTED NOT DETECTED Final   Staphylococcus species DETECTED (A) NOT DETECTED Final    Comment: CRITICAL RESULT CALLED TO, READ BACK BY AND VERIFIED WITH: J. MILLEN PHARMD, AT 1124 11/06/19 BY D. VANHOOK    Staphylococcus aureus (BCID) DETECTED (A) NOT DETECTED Final    Comment: Methicillin (oxacillin)-resistant Staphylococcus aureus (MRSA). MRSA is predictably resistant to beta-lactam antibiotics (except ceftaroline). Preferred  therapy is vancomycin unless clinically contraindicated. Patient requires contact precautions if  hospitalized. CRITICAL RESULT CALLED TO, READ BACK BY AND VERIFIED WITH: J. MILLEN PHARMD, AT 1124 11/06/19 BY D. VANHOOK    Staphylococcus epidermidis PENDING NOT DETECTED Incomplete   Staphylococcus lugdunensis PENDING NOT DETECTED Incomplete   Streptococcus species NOT DETECTED NOT DETECTED Final   Streptococcus agalactiae NOT DETECTED NOT DETECTED Final   Streptococcus pneumoniae NOT DETECTED NOT DETECTED Final   Streptococcus pyogenes NOT DETECTED NOT DETECTED Final   A.calcoaceticus-baumannii NOT DETECTED NOT DETECTED Final   Bacteroides fragilis NOT DETECTED NOT DETECTED Final   Enterobacterales PENDING NOT DETECTED Incomplete   Enterobacter cloacae complex NOT DETECTED NOT DETECTED Final   Escherichia coli NOT DETECTED NOT  DETECTED Final   Klebsiella aerogenes NOT DETECTED NOT DETECTED Final   Klebsiella oxytoca NOT DETECTED NOT DETECTED Final   Klebsiella pneumoniae NOT DETECTED NOT DETECTED Final   Proteus species NOT DETECTED NOT DETECTED Final   Salmonella species NOT DETECTED NOT DETECTED Final   Serratia marcescens NOT DETECTED NOT DETECTED Final   Haemophilus influenzae NOT DETECTED NOT DETECTED Final   Neisseria meningitidis NOT DETECTED NOT DETECTED Final   Pseudomonas aeruginosa NOT DETECTED NOT DETECTED Final   Stenotrophomonas maltophilia NOT DETECTED NOT DETECTED Final   Candida albicans NOT DETECTED NOT DETECTED Final   Candida auris NOT DETECTED NOT DETECTED Final   Candida glabrata NOT DETECTED NOT DETECTED Final   Candida krusei NOT DETECTED NOT DETECTED Final   Candida parapsilosis NOT DETECTED NOT DETECTED Final   Candida tropicalis NOT DETECTED NOT DETECTED Final   Cryptococcus neoformans/gattii NOT DETECTED NOT DETECTED Final    Comment: Performed at [facility], [address], [place], [place] [address]   Meth resistant mecA/C and MREJ PENDING NOT DETECTED Incomplete   Carbapenem resist OXA 48 LIKE PENDING NOT DETECTED Incomplete   Carbapenem resistance VIM PENDING NOT DETECTED Incomplete  Blood culture (routine x 2)     Status: None (Preliminary result)   Collection Time: 11/05/19  9:47 PM   Specimen: BLOOD RIGHT ARM  Result Value Ref Range Status   Specimen Description BLOOD RIGHT ARM  Final   Special Requests   Final    BOTTLES DRAWN AEROBIC AND ANAEROBIC Blood Culture adequate volume   Culture  Setup Time   Final    GRAM POSITIVE COCCI IN CLUSTERS IN BOTH AEROBIC AND ANAEROBIC BOTTLES CRITICAL VALUE NOTED.  VALUE IS CONSISTENT WITH PREVIOUSLY REPORTED AND CALLED VALUE. Performed at [facility], [address], [place], [place] [address]    Culture GRAM POSITIVE COCCI  Final   Report Status PENDING  Incomplete  Blood culture (routine x 2)     Status: None  (Preliminary result)   Collection Time: 11/05/19 10:36 PM   Specimen: BLOOD  Result Value Ref Range Status   Specimen Description BLOOD SITE NOT SPECIFIED  Final   Special Requests   Final    BOTTLES DRAWN AEROBIC AND ANAEROBIC Blood Culture adequate volume   Culture  Setup Time   Final    GRAM POSITIVE COCCI IN CLUSTERS IN BOTH AEROBIC AND ANAEROBIC BOTTLES CRITICAL VALUE NOTED.  VALUE IS CONSISTENT WITH PREVIOUSLY REPORTED AND CALLED VALUE. Performed at [facility], [address] [place], [place] [address]    Culture GRAM POSITIVE COCCI  Final   Report Status PENDING  Incomplete         Radiology Studies: DG Chest 2 View  Result Date:  11/05/2019 CLINICAL DATA:  Back pain EXAM: CHEST - 2 VIEW COMPARISON:  11/03/2019 FINDINGS: Cardiac shadow is stable. Scattered nodules are identified throughout both lungs similar to that seen on prior CT examination is suspicious for septic emboli. No new pleural effusion is seen. No bony abnormality is noted. IMPRESSION: Scattered nodules consistent with septic emboli similar to that seen on prior CT examination. No new focal abnormality is seen. Electronically Signed   By: Alcide Clever M.D.   On: 11/05/2019 21:35   MR BRAIN WO CONTRAST  Result Date: 11/04/2019 CLINICAL DATA:  Bacteremia.  Rule out septic emboli. EXAM: MRI HEAD WITHOUT CONTRAST TECHNIQUE: Multiplanar, multiecho pulse sequences of the brain and surrounding structures were obtained without intravenous contrast. COMPARISON:  Head CT January 18, 2018. FINDINGS: Brain: No acute infarction, hemorrhage, hydrocephalus, extra-axial collection or mass lesion. The brain parenchyma has normal morphology and signal characteristics. Vascular: Normal flow voids. Skull and upper cervical spine: Diffuse decrease of the T1 signal within the visualized upper cervical spine, calvarium and mandible may represent red marrow reconversion. Correlate with CBC. Sinuses/Orbits: Minimal mucosal  thickening of the right maxillary sinus. The orbits are maintained. Other: None. IMPRESSION: 1. No acute intracranial abnormality. Unremarkable MRI of the brain. 2. Diffuse decrease of the T1 signal within the visualized upper cervical spine, calvarium and mandible may represent red marrow reconversion. Correlate with CBC. Electronically Signed   By: Baldemar Lenis M.D.   On: 11/04/2019 20:52   CT ANKLE RIGHT W CONTRAST  Result Date: 11/04/2019 CLINICAL DATA:  Foot swelling bacteremia EXAM: CT OF THE RIGHT ANKLE WITH CONTRAST TECHNIQUE: Multidetector CT imaging of the right ankle was performed following the standard protocol during bolus administration of intravenous contrast. CONTRAST:  OMNIPAQUE IOHEXOL 300 MG/ML  SOLN COMPARISON:  None. FINDINGS: Bones/Joint/Cartilage No fracture or dislocation. No areas of cortical destruction or periosteal reaction are seen. The articular surfaces appear maintained. No large ankle joint effusion. Ligaments Suboptimally assessed by CT. Muscles and Tendons The muscles surrounding the ankle and foot appear to be intact without focal atrophy or tear. The flexor and extensor tendons are intact. The Achilles tendon is intact. Soft tissues: There is subcutaneous edema seen along the posterior heel pad with mild skin thickening. There also appears to be mildly heterogeneous density with thickening in the middle cord of the plantar fascia, however it is intact. No loculated fluid collections are noted. IMPRESSION: Findings which could be suggestive of diffuse cellulitis along the heel pad. No loculated fluid collections or definite evidence of osteomyelitis. Electronically Signed   By: Jonna Clark M.D.   On: 11/04/2019 21:45   CT FOOT LEFT W CONTRAST  Result Date: 11/04/2019 CLINICAL DATA:  Foot swelling bacteremia EXAM: CT OF THE RIGHT ANKLE WITH CONTRAST TECHNIQUE: Multidetector CT imaging of the right ankle was performed following the standard protocol  during bolus administration of intravenous contrast. CONTRAST:  OMNIPAQUE IOHEXOL 300 MG/ML  SOLN COMPARISON:  None. FINDINGS: Bones/Joint/Cartilage No fracture or dislocation. No areas of cortical destruction or periosteal reaction are seen. The articular surfaces appear maintained. No large ankle joint effusion. Ligaments Suboptimally assessed by CT. Muscles and Tendons The muscles surrounding the ankle and foot appear to be intact without focal atrophy or tear. The flexor and extensor tendons are intact. The Achilles tendon is intact. Soft tissues: There is subcutaneous edema seen along the posterior heel pad with mild skin thickening. There also appears to be mildly heterogeneous density with thickening in the middle  cord of the plantar fascia, however it is intact. No loculated fluid collections are noted. IMPRESSION: Findings which could be suggestive of diffuse cellulitis along the heel pad. No loculated fluid collections or definite evidence of osteomyelitis. Electronically Signed   By: Jonna Clark M.D.   On: 11/04/2019 21:45        Scheduled Meds: . nicotine  21 mg Transdermal Daily  . potassium chloride  40 mEq Oral Once  . sodium chloride flush  3 mL Intravenous Q12H   Continuous Infusions: . sodium chloride 75 mL/hr at 11/06/19 0033  . sodium chloride    . heparin 1,050 Units/hr (11/06/19 1112)  . vancomycin 1,000 mg (11/06/19 1103)     LOS: 1 day        Kathlen Mody, MD [facility]   To contact the attending provider between 7A-7P or the covering provider during after hours 7P-7A, please log into the web site [email or web address] and access using universal [place] password for that web site. If you do not have the password, please call the hospital operator.  11/06/2019, 3:25 PM

## 2019-11-06 NOTE — Progress Notes (Addendum)
[facility]  Date of Admission:  11/05/2019      Lines:  Peripheral iv  Abx: 10/21-c vanc                                                     Assessment: 29 y.o. femalewith medical history significant ofIVDA, distant hx rle (knee/tib-fib) orif, admitted 10/21 with sepsis in setting of 3 weeks moderate headache and several days generalized weakness/fatigue, LE weakness/pain, found to have mrsa bacteremia/TV endocarditis and RV thrombus.  She had left ama on 10/22 but returned 10/23.   10/21, 10/23 bcx MRSA (vanc mic 0.5)  Chest ct c/w septic pulm emboli with LUL cavitary nodules. Mri brain (somnolent on initial presentation in setting opioid) no septic emboli. Bilateral foot ct cellulitic change (right medial malleolus tender/red). tte rv thrombus, tv vegetation. She has lower back pain/tenderness. Mri thoracic/lumbar no obvious osseous or soft tissue pyogenic focus but mention L4-5 facet joint edema  She is on anticoagulation and followed by cardiology/cts  Plan: 1. Continue vancomycin per pharmacy protocol 2. Anticipate 6-8 weeks abx given lumbar osseous involvement, and potentially chronic suppressive therapy given RLE hardware 3. Repeat bcx tomorrow and daily thereafter until 3 days persistently negative 4. TEE planned for further management of potential angiovac by cts  Principal Problem:   Bacterial endocarditis Active Problems:   IV drug abuse (HCC)   Sepsis (HCC)   MRSA bacteremia   Septic embolism (HCC)   Hyponatremia   Right ventricular mass   Scheduled Meds:  nicotine  21 mg Transdermal Daily   potassium chloride  40 mEq Oral Once   sodium chloride flush  3 mL Intravenous Q12H   Continuous Infusions:  sodium chloride 75 mL/hr at 11/06/19 0033   sodium chloride     heparin 1,050 Units/hr (11/06/19 1112)   vancomycin 1,000 mg (11/06/19 1103)   PRN Meds:.acetaminophen **OR** acetaminophen, alum & mag  hydroxide-simeth, hydrOXYzine, ketorolac, ondansetron **OR** ondansetron (ZOFRAN) IV   SUBJECTIVE: Overall feels tired, no new focal pain No urinary bowel incontinent No n/v/diarrhea  Review of Systems: ROS Negative 11 point ros unless mentioned above  No Known Allergies  OBJECTIVE: Vitals:   11/06/19 0012 11/06/19 0326 11/06/19 0830 11/06/19 1213  BP: 109/76 105/67 117/73   Pulse: (!) 116 (!) 115 (!) 101   Resp: (!) 22 18 18 20   Temp: 98.7 F (37.1 C) 98 F (36.7 C) 98.2 F (36.8 C) 98.2 F (36.8 C)  TempSrc: Oral Oral Oral Oral  SpO2: 100% 98%    Weight: 48.7 kg      Body mass index is 19.64 kg/m.  Physical Exam Drowsy, thin, no distress Normocephalic; per; conj clear Neck supple cv rrr no mrg Lungs clear normal respiratory effort abd soft nontender msk stable bilateral foot tenderness; right medial malleolus erythema improving, and without fluctuance; able to do active rom bilateral ankle joint. Mild tender back lumbar area.  Neuro nonfocal Psych drowsy, but cooperative/appropriate Skin: no rash. Mid upper chest jewelry implantation on chest.  Lab Results Lab Results  Component Value Date   WBC 10.7 (H) 11/06/2019   HGB 8.2 (L) 11/06/2019   HCT 24.5 (L) 11/06/2019   MCV 78.8 (L) 11/06/2019   PLT 305 11/06/2019    Lab Results  Component Value Date  CREATININE 0.55 11/06/2019   BUN 9 11/06/2019   NA 131 (L) 11/06/2019   K 3.3 (L) 11/06/2019   CL 103 11/06/2019   CO2 20 (L) 11/06/2019    Lab Results  Component Value Date   ALT 22 11/05/2019   AST 32 11/05/2019   ALKPHOS 128 (H) 11/05/2019   BILITOT 0.4 11/05/2019     Microbiology: Recent Results (from the past 240 hour(s))  Urine culture     Status: Abnormal   Collection Time: 11/03/19  8:18 AM   Specimen: In/Out Cath Urine  Result Value Ref Range Status   Specimen Description   Final    IN/OUT CATH URINE Performed at [facility], [address] [place], [place]  [id number]    Special Requests   Final    NONE Performed at [facility], [address] [place], [place] [id number]    Culture >=100,000 COLONIES/mL ESCHERICHIA COLI (A)  Final   Report Status 11/05/2019 FINAL  Final   Organism ID, Bacteria ESCHERICHIA COLI (A)  Final      Susceptibility   Escherichia coli - MIC*    AMPICILLIN 4 SENSITIVE Sensitive     CEFAZOLIN <=4 SENSITIVE Sensitive     CEFTRIAXONE <=0.25 SENSITIVE Sensitive     CIPROFLOXACIN <=0.25 SENSITIVE Sensitive     GENTAMICIN <=1 SENSITIVE Sensitive     IMIPENEM <=0.25 SENSITIVE Sensitive     NITROFURANTOIN <=16 SENSITIVE Sensitive     TRIMETH/SULFA <=20 SENSITIVE Sensitive     AMPICILLIN/SULBACTAM <=2 SENSITIVE Sensitive     PIP/TAZO <=4 SENSITIVE Sensitive     * >=100,000 COLONIES/mL ESCHERICHIA COLI  Respiratory Panel by RT PCR (Flu A&B, Covid) - Nasopharyngeal Swab     Status: None   Collection Time: 11/03/19  9:13 AM   Specimen: Nasopharyngeal Swab  Result Value Ref Range Status   SARS Coronavirus 2 by RT PCR NEGATIVE NEGATIVE Final    Comment: (NOTE) SARS-CoV-2 target nucleic acids are NOT DETECTED.  The SARS-CoV-2 RNA is generally detectable in upper respiratoy specimens during the acute phase of infection. The lowest concentration of SARS-CoV-2 viral copies this assay can detect is 131 copies/mL. A negative result does not preclude SARS-Cov-2 infection and should not be used as the sole basis for treatment or other patient management decisions. A negative result may occur with  improper specimen collection/handling, submission of specimen other than nasopharyngeal swab, presence of viral mutation(s) within the areas targeted by this assay, and inadequate number of viral copies (<131 copies/mL). A negative result must be combined with clinical observations, patient history, and epidemiological information. The expected result is Negative.  Fact Sheet for Patients:    https://www.moore.com/https://www.fda.gov/media/142436/download  Fact Sheet for Healthcare Providers:  https://www.young.biz/https://www.fda.gov/media/142435/download  This test is no t yet approved or cleared by the [place] FDA and  has been authorized for detection and/or diagnosis of SARS-CoV-2 by FDA under an Emergency Use Authorization (EUA). This EUA will remain  in effect (meaning this test can be used) for the duration of the COVID-19 declaration under Section 564(b)(1) of the Act, 21 U.S.C. section 360bbb-3(b)(1), unless the authorization is terminated or revoked sooner.     Influenza A by PCR NEGATIVE NEGATIVE Final   Influenza B by PCR NEGATIVE NEGATIVE Final    Comment: (NOTE) The Xpert Xpress SARS-CoV-2/FLU/RSV assay is intended as an aid in  the diagnosis of influenza from Nasopharyngeal swab specimens and  should not be used as a sole basis for treatment. Nasal  washings and  aspirates are unacceptable for Xpert Xpress SARS-CoV-2/FLU/RSV  testing.  Fact Sheet for Patients: https://www.moore.com/  Fact Sheet for Healthcare Providers: https://www.young.biz/  This test is not yet approved or cleared by the [place] FDA and  has been authorized for detection and/or diagnosis of SARS-CoV-2 by  FDA under an Emergency Use Authorization (EUA). This EUA will remain  in effect (meaning this test can be used) for the duration of the  Covid-19 declaration under Section 564(b)(1) of the Act, 21  U.S.C. section 360bbb-3(b)(1), unless the authorization is  terminated or revoked. Performed at [facility], [address], [place], [place] [address]   Blood Culture (routine x 2)     Status: Abnormal   Collection Time: 11/03/19  9:50 AM   Specimen: BLOOD RIGHT HAND  Result Value Ref Range Status   Specimen Description   Final    BLOOD RIGHT HAND Performed at [facility], [address], [place], [place] [address]    Special  Requests   Final    BOTTLES DRAWN AEROBIC ONLY Blood Culture adequate volume Performed at [facility], [address] [place], [place] [address]    Culture  Setup Time   Final    AEROBIC BOTTLE ONLY GRAM POSITIVE COCCI CRITICAL VALUE NOTED.  VALUE IS CONSISTENT WITH PREVIOUSLY REPORTED AND CALLED VALUE.    Culture (A)  Final    STAPHYLOCOCCUS AUREUS SUSCEPTIBILITIES PERFORMED ON PREVIOUS CULTURE WITHIN THE LAST 5 DAYS. Performed at [facility], [address] [place], [place] [address]    Report Status 11/05/2019 FINAL  Final  Blood Culture (routine x 2)     Status: Abnormal   Collection Time: 11/03/19  9:50 AM   Specimen: BLOOD  Result Value Ref Range Status   Specimen Description   Final    BLOOD LEFT ARM Performed at [facility], [address], [place], [place] [address]    Special Requests   Final    BOTTLES DRAWN AEROBIC AND ANAEROBIC Blood Culture adequate volume Performed at [facility], [address] [place], [place] [address]    Culture  Setup Time   Final    IN BOTH AEROBIC AND ANAEROBIC BOTTLES GRAM POSITIVE COCCI CRITICAL VALUE NOTED.  VALUE IS CONSISTENT WITH PREVIOUSLY REPORTED AND CALLED VALUE.    Culture (A)  Final    STAPHYLOCOCCUS AUREUS SUSCEPTIBILITIES PERFORMED ON PREVIOUS CULTURE WITHIN THE LAST 5 DAYS. Performed at [facility], [address], [place], [place] [address]    Report Status 11/05/2019 FINAL  Final  Culture, blood (Routine X 2) w Reflex to ID Panel     Status: Abnormal   Collection Time: 11/03/19  9:52 AM   Specimen: BLOOD  Result Value Ref Range Status   Specimen Description   Final    BLOOD RIGHT ARM Performed at [facility], [address] [place], [place] [address]    Special Requests   Final    BOTTLES DRAWN AEROBIC ONLY Blood Culture adequate volume Performed at [facility], [address] [place], [place]  [address]    Culture  Setup Time   Final    AEROBIC BOTTLE ONLY GRAM POSITIVE COCCI Organism ID to follow CRITICAL RESULT CALLED TO, READ BACK BY AND VERIFIED WITH:  PHARMD E JACKSON 11/03/19 AT 1157 SK Performed at [facility], [address] [place], [place] [address]    Culture  METHICILLIN RESISTANT STAPHYLOCOCCUS AUREUS (A)  Final   Report Status 11/05/2019 FINAL  Final   Organism ID, Bacteria METHICILLIN RESISTANT STAPHYLOCOCCUS AUREUS  Final      Susceptibility   Methicillin resistant staphylococcus aureus - MIC*    CIPROFLOXACIN >=8 RESISTANT Resistant     ERYTHROMYCIN >=8 RESISTANT Resistant     GENTAMICIN <=0.5 SENSITIVE Sensitive     OXACILLIN >=4 RESISTANT Resistant     TETRACYCLINE <=1 SENSITIVE Sensitive     VANCOMYCIN <=0.5 SENSITIVE Sensitive     TRIMETH/SULFA 160 RESISTANT Resistant     CLINDAMYCIN >=8 RESISTANT Resistant     RIFAMPIN <=0.5 SENSITIVE Sensitive     Inducible Clindamycin NEGATIVE Sensitive     * METHICILLIN RESISTANT STAPHYLOCOCCUS AUREUS  Blood Culture ID Panel (Reflexed)     Status: Abnormal   Collection Time: 11/03/19  9:52 AM  Result Value Ref Range Status   Enterococcus faecalis NOT DETECTED NOT DETECTED Final   Enterococcus Faecium NOT DETECTED NOT DETECTED Final   Listeria monocytogenes NOT DETECTED NOT DETECTED Final   Staphylococcus species DETECTED (A) NOT DETECTED Final    Comment: CRITICAL RESULT CALLED TO, READ BACK BY AND VERIFIED WITH: PHARMD E JACKSON 11/03/19 AT 2357 SK    Staphylococcus aureus (BCID) DETECTED (A) NOT DETECTED Final    Comment: Methicillin (oxacillin)-resistant Staphylococcus aureus (MRSA). MRSA is predictably resistant to beta-lactam antibiotics (except ceftaroline). Preferred therapy is vancomycin unless clinically contraindicated. Patient requires contact precautions if  hospitalized. CRITICAL RESULT CALLED TO, READ BACK BY AND VERIFIED WITH: PHARMD E JACKSON 11/03/19 AT 2357 SK    Staphylococcus  epidermidis NOT DETECTED NOT DETECTED Final   Staphylococcus lugdunensis NOT DETECTED NOT DETECTED Final   Streptococcus species NOT DETECTED NOT DETECTED Final   Streptococcus agalactiae NOT DETECTED NOT DETECTED Final   Streptococcus pneumoniae NOT DETECTED NOT DETECTED Final   Streptococcus pyogenes NOT DETECTED NOT DETECTED Final   A.calcoaceticus-baumannii NOT DETECTED NOT DETECTED Final   Bacteroides fragilis NOT DETECTED NOT DETECTED Final   Enterobacterales NOT DETECTED NOT DETECTED Final   Enterobacter cloacae complex NOT DETECTED NOT DETECTED Final   Escherichia coli NOT DETECTED NOT DETECTED Final   Klebsiella aerogenes NOT DETECTED NOT DETECTED Final   Klebsiella oxytoca NOT DETECTED NOT DETECTED Final   Klebsiella pneumoniae NOT DETECTED NOT DETECTED Final   Proteus species NOT DETECTED NOT DETECTED Final   Salmonella species NOT DETECTED NOT DETECTED Final   Serratia marcescens NOT DETECTED NOT DETECTED Final   Haemophilus influenzae NOT DETECTED NOT DETECTED Final   Neisseria meningitidis NOT DETECTED NOT DETECTED Final   Pseudomonas aeruginosa NOT DETECTED NOT DETECTED Final   Stenotrophomonas maltophilia NOT DETECTED NOT DETECTED Final   Candida albicans NOT DETECTED NOT DETECTED Final   Candida auris NOT DETECTED NOT DETECTED Final   Candida glabrata NOT DETECTED NOT DETECTED Final   Candida krusei NOT DETECTED NOT DETECTED Final   Candida parapsilosis NOT DETECTED NOT DETECTED Final   Candida tropicalis NOT DETECTED NOT DETECTED Final   Cryptococcus neoformans/gattii NOT DETECTED NOT DETECTED Final   Meth resistant mecA/C and MREJ DETECTED (A) NOT DETECTED Final    Comment: CRITICAL RESULT CALLED TO, READ BACK BY AND VERIFIED WITH: PHARMD E JACKSON 11/03/19 AT 2357 SK Performed at [facility], [address] [place], [place] [address]   Culture, blood (Routine X 2) w Reflex to ID Panel     Status: None (Preliminary result)   Collection Time: 11/05/19  8:29  AM  Specimen: BLOOD  Result Value Ref Range Status   Specimen Description BLOOD RIGHT ANTECUBITAL  Final   Special Requests   Final    BOTTLES DRAWN AEROBIC ONLY Blood Culture adequate volume   Culture  Setup Time   Final    GRAM POSITIVE COCCI IN CLUSTERS AEROBIC BOTTLE ONLY Organism ID to follow CRITICAL RESULT CALLED TO, READ BACK BY AND VERIFIED WITH: J. MILLEN PHARMD, AT 1124 11/06/19 Performed at [facility], [address] [place], [place] [address]    Culture GRAM POSITIVE COCCI  Final   Report Status PENDING  Incomplete  Culture, blood (Routine X 2) w Reflex to ID Panel     Status: None (Preliminary result)   Collection Time: 11/05/19  8:29 AM   Specimen: BLOOD RIGHT HAND  Result Value Ref Range Status   Specimen Description BLOOD RIGHT HAND  Final   Special Requests   Final    BOTTLES DRAWN AEROBIC ONLY Blood Culture adequate volume   Culture  Setup Time   Final    GRAM POSITIVE COCCI IN CLUSTERS AEROBIC BOTTLE ONLY CRITICAL VALUE NOTED.  VALUE IS CONSISTENT WITH PREVIOUSLY REPORTED AND CALLED VALUE. Performed at [facility], [address], [place], [place] [address]    Culture GRAM POSITIVE COCCI  Final   Report Status PENDING  Incomplete  Blood Culture ID Panel (Reflexed)     Status: Abnormal (Preliminary result)   Collection Time: 11/05/19  8:29 AM  Result Value Ref Range Status   Enterococcus faecalis NOT DETECTED NOT DETECTED Final   Enterococcus Faecium NOT DETECTED NOT DETECTED Final   Listeria monocytogenes NOT DETECTED NOT DETECTED Final   Staphylococcus species DETECTED (A) NOT DETECTED Final    Comment: CRITICAL RESULT CALLED TO, READ BACK BY AND VERIFIED WITH: J. MILLEN PHARMD, AT 1124 11/06/19 BY D. VANHOOK    Staphylococcus aureus (BCID) DETECTED (A) NOT DETECTED Final    Comment: Methicillin (oxacillin)-resistant Staphylococcus aureus (MRSA). MRSA is predictably resistant to beta-lactam antibiotics (except ceftaroline). Preferred  therapy is vancomycin unless clinically contraindicated. Patient requires contact precautions if  hospitalized. CRITICAL RESULT CALLED TO, READ BACK BY AND VERIFIED WITH: J. MILLEN PHARMD, AT 1124 11/06/19 BY D. VANHOOK    Staphylococcus epidermidis PENDING NOT DETECTED Incomplete   Staphylococcus lugdunensis PENDING NOT DETECTED Incomplete   Streptococcus species NOT DETECTED NOT DETECTED Final   Streptococcus agalactiae NOT DETECTED NOT DETECTED Final   Streptococcus pneumoniae NOT DETECTED NOT DETECTED Final   Streptococcus pyogenes NOT DETECTED NOT DETECTED Final   A.calcoaceticus-baumannii NOT DETECTED NOT DETECTED Final   Bacteroides fragilis NOT DETECTED NOT DETECTED Final   Enterobacterales PENDING NOT DETECTED Incomplete   Enterobacter cloacae complex NOT DETECTED NOT DETECTED Final   Escherichia coli NOT DETECTED NOT DETECTED Final   Klebsiella aerogenes NOT DETECTED NOT DETECTED Final   Klebsiella oxytoca NOT DETECTED NOT DETECTED Final   Klebsiella pneumoniae NOT DETECTED NOT DETECTED Final   Proteus species NOT DETECTED NOT DETECTED Final   Salmonella species NOT DETECTED NOT DETECTED Final   Serratia marcescens NOT DETECTED NOT DETECTED Final   Haemophilus influenzae NOT DETECTED NOT DETECTED Final   Neisseria meningitidis NOT DETECTED NOT DETECTED Final   Pseudomonas aeruginosa NOT DETECTED NOT DETECTED Final   Stenotrophomonas maltophilia NOT DETECTED NOT DETECTED Final   Candida albicans NOT DETECTED NOT DETECTED Final   Candida auris NOT DETECTED NOT DETECTED Final   Candida glabrata NOT DETECTED NOT DETECTED Final   Candida krusei NOT  DETECTED NOT DETECTED Final   Candida parapsilosis NOT DETECTED NOT DETECTED Final   Candida tropicalis NOT DETECTED NOT DETECTED Final   Cryptococcus neoformans/gattii NOT DETECTED NOT DETECTED Final    Comment: Performed at [facility], [address], [place], [place] [address]   Meth resistant mecA/C and MREJ PENDING NOT  DETECTED Incomplete   Carbapenem resist OXA 48 LIKE PENDING NOT DETECTED Incomplete   Carbapenem resistance VIM PENDING NOT DETECTED Incomplete  Blood culture (routine x 2)     Status: None (Preliminary result)   Collection Time: 11/05/19  9:47 PM   Specimen: BLOOD RIGHT ARM  Result Value Ref Range Status   Specimen Description BLOOD RIGHT ARM  Final   Special Requests   Final    BOTTLES DRAWN AEROBIC AND ANAEROBIC Blood Culture adequate volume   Culture  Setup Time   Final    GRAM POSITIVE COCCI IN CLUSTERS IN BOTH AEROBIC AND ANAEROBIC BOTTLES CRITICAL VALUE NOTED.  VALUE IS CONSISTENT WITH PREVIOUSLY REPORTED AND CALLED VALUE. Performed at [facility], [address], [place], [place] [address]    Culture GRAM POSITIVE COCCI  Final   Report Status PENDING  Incomplete  Blood culture (routine x 2)     Status: None (Preliminary result)   Collection Time: 11/05/19 10:36 PM   Specimen: BLOOD  Result Value Ref Range Status   Specimen Description BLOOD SITE NOT SPECIFIED  Final   Special Requests   Final    BOTTLES DRAWN AEROBIC AND ANAEROBIC Blood Culture adequate volume   Culture   Final    NO GROWTH < 12 HOURS Performed at [facility], [address] [place], [place] [address]    Report Status PENDING  Incomplete     Imaging: 10/22 mr t/l spine 1. No evidence of discitis osteomyelitis or infected facet joint in the thoracic region. 2. No evidence of degenerative disc disease or disc space infection in the lumbar region. There is some edema associated with the facet joints at L4-5, without frank joint effusions. This could be degenerative in nature or could reflect the earliest changes of facet joint infection. 3. Small pleural effusions layering dependently, right more than left, with dependent atelectasis in the lower lobes right more than left. Focal pulmonary infiltrates as shown by CT yesterday.  10/22 ct left/right foot No osseous abnormality but  imaging suggestion cellulitis changes in heel pads  10/22 TTE 1. Left ventricular ejection fraction, by estimation, is 60 to 65%. The  left ventricle has normal function. The left ventricle has no regional  wall motion abnormalities. Left ventricular diastolic parameters were  normal.  2. Right ventricular systolic function There is an ill defined mass  encompassing a large portion of the RV cavity. Recommend cardiac MRI for  further evaulation.. The right ventricular size is normal. There is normal  pulmonary artery systolic pressure.  The estimated right ventricular systolic pressure is 18.4 mmHg.  3. The mitral valve is normal in structure. No evidence of mitral valve  regurgitation. No evidence of mitral stenosis.  4. Possible vegetation on TV leaflet. Consider TEE for further  evaulation. . The tricuspid valve is abnormal.  5. The aortic valve was not well visualized. Aortic valve regurgitation  is not visualized. No aortic stenosis is present.  6. The inferior vena cava is normal in size with greater than 50%  respiratory variability, suggesting right atrial pressure of 3  mmHg.Recommend Cardiac MRI to evaluated abnormality in RV  cavity. ALso  ecoh concerning for TV endocardidits.   10/22 mr brain 1. No acute intracranial abnormality. Unremarkable MRI of the brain. 2. Diffuse decrease of the T1 signal within the visualized upper cervical spine, calvarium and mandible may represent red marrow reconversion. Correlate with CBC.  10/21 chest ct No acute pulmonary embolism.    Pulmonary findings most consistent with septic emboli. Minor interstitial pulmonary edema. Raymondo Band, MD [facility] [phone] pager    11/06/2019, 2:17 PM

## 2019-11-06 NOTE — Progress Notes (Signed)
Cardiology Progress Note  Patient ID: Amber [name] MRN: [id number] DOB: 05/15/1990 Date of Encounter: 11/06/2019  Primary Cardiologist: No primary care provider on file.  Subjective   Chief Complaint: Not feeling well.  HPI: Reports she is not feeling well today.  Readmitted to the hospital.  She reports she is committed to staying for 6 weeks of IV antibiotics.  She does need a transesophageal echocardiogram and possibly cardiac MRI.  ROS:  All other ROS reviewed and negative. Pertinent positives noted in the HPI.     Inpatient Medications  Scheduled Meds: . nicotine  21 mg Transdermal Daily  . sodium chloride flush  3 mL Intravenous Q12H   Continuous Infusions: . sodium chloride 75 mL/hr at 11/06/19 0033  . sodium chloride    . heparin 850 Units/hr (11/05/19 2352)  . vancomycin Stopped (11/06/19 0024)   PRN Meds: acetaminophen **OR** acetaminophen, alum & mag hydroxide-simeth, hydrOXYzine, ketorolac, ondansetron **OR** ondansetron (ZOFRAN) IV   Vital Signs   Vitals:   11/05/19 2300 11/06/19 0012 11/06/19 0326 11/06/19 0830  BP: 116/70 109/76 105/67 117/73  Pulse: (!) 119 (!) 116 (!) 115 (!) 101  Resp: (!) 35 (!) 22 18 18   Temp:  98.7 F (37.1 C) 98 F (36.7 C) 98.2 F (36.8 C)  TempSrc:  Oral Oral Oral  SpO2: 100% 100% 98%   Weight:  48.7 kg      Intake/Output Summary (Last 24 hours) at 11/06/2019 0920 Last data filed at 11/06/2019 0840 Gross per 24 hour  Intake 363 ml  Output --  Net 363 ml   Last 3 Weights 11/06/2019 11/05/2019 11/04/2019  Weight (lbs) 107 lb 5.8 oz 111 lb 1.8 oz 111 lb 1.8 oz  Weight (kg) 48.7 kg 50.4 kg 50.4 kg      Telemetry  Overnight telemetry shows sinus tachycardia with heart rate in the 100, which I personally reviewed.   ECG  The most recent ECG shows sinus tachycardia, no acute ischemic changes, which I personally reviewed.   Physical Exam   Vitals:   11/05/19 2300 11/06/19 0012 11/06/19 0326 11/06/19 0830  BP:  116/70 109/76 105/67 117/73  Pulse: (!) 119 (!) 116 (!) 115 (!) 101  Resp: (!) 35 (!) 22 18 18   Temp:  98.7 F (37.1 C) 98 F (36.7 C) 98.2 F (36.8 C)  TempSrc:  Oral Oral Oral  SpO2: 100% 100% 98%   Weight:  48.7 kg       Intake/Output Summary (Last 24 hours) at 11/06/2019 0920 Last data filed at 11/06/2019 0840 Gross per 24 hour  Intake 363 ml  Output --  Net 363 ml    Last 3 Weights 11/06/2019 11/05/2019 11/04/2019  Weight (lbs) 107 lb 5.8 oz 111 lb 1.8 oz 111 lb 1.8 oz  Weight (kg) 48.7 kg 50.4 kg 50.4 kg    Body mass index is 19.64 kg/m.  General: Cachexia noted, ill-appearing Head: Atraumatic, normal size  Eyes: PEERLA, EOMI  Neck: Supple, no JVD Endocrine: No thryomegaly Cardiac: Normal S1, S2; tachycardia noted, no appreciable murmurs Lungs: Diminished breath sounds bilaterally Abd: Soft, nontender, no hepatomegaly  Ext: No edema, pulses 2+ Musculoskeletal: No deformities, BUE and BLE strength normal and equal Skin: Warm and dry, no rashes   Neuro: Alert and oriented to person, place, time, and situation, CNII-XII grossly intact, no focal deficits  Psych: Normal mood and affect   Labs  High Sensitivity Troponin:   Recent Labs  Lab 11/03/19 2240  TROPONINIHS 14  Cardiac EnzymesNo results for input(s): TROPONINI in the last 168 hours. No results for input(s): TROPIPOC in the last 168 hours.  Chemistry Recent Labs  Lab 11/03/19 0950 11/03/19 0950 11/03/19 2240 11/03/19 2240 11/04/19 0434 11/04/19 1550 11/05/19 2014  NA 128*  123*   < > 131*  --  129*  --  131*  K 4.2  4.1   < > 3.8  --  3.5  --  3.4*  CL 89*  86*   < > 95*  --  96*  --  99  CO2 22  22   < > 25  --  24  --  20*  GLUCOSE 88  92   < > 105*  --  134*  --  148*  BUN 39*  39*   < > 30*  --  22*  --  8  CREATININE 1.33*  1.33*   < > 0.77  --  0.66  --  0.66  CALCIUM 8.2*  8.0*   < > 7.9*  --  7.5*  --  8.3*  PROT 7.5  --   --   --   --  6.0* 6.9  ALBUMIN 2.2*  2.2*   < >  1.8*   < > 1.5* 1.7* 1.8*  AST 84*  --   --   --   --  29 32  ALT 36  --   --   --   --  23 22  ALKPHOS 133*  --   --   --   --  103 128*  BILITOT 1.0  --   --   --   --  0.8 0.4  GFRNONAA 56*  56*   < > >60  --  >60  --  >60  ANIONGAP 17*  15   < > 11  --  9  --  12   < > = values in this interval not displayed.    Hematology Recent Labs  Lab 11/03/19 0950 11/04/19 0434 11/05/19 2014  WBC 10.7* 10.3 12.7*  RBC 4.49 3.48* 3.79*  HGB 12.0 9.3* 9.9*  HCT 35.6* 27.9* 30.8*  MCV 79.3* 80.2 81.3  MCH 26.7 26.7 26.1  MCHC 33.7 33.3 32.1  RDW 14.4 14.3 14.6  PLT 173 193 346   BNPNo results for input(s): BNP, PROBNP in the last 168 hours.  DDimer No results for input(s): DDIMER in the last 168 hours.   Radiology  DG Chest 2 View  Result Date: 11/05/2019 CLINICAL DATA:  Back pain EXAM: CHEST - 2 VIEW COMPARISON:  11/03/2019 FINDINGS: Cardiac shadow is stable. Scattered nodules are identified throughout both lungs similar to that seen on prior CT examination is suspicious for septic emboli. No new pleural effusion is seen. No bony abnormality is noted. IMPRESSION: Scattered nodules consistent with septic emboli similar to that seen on prior CT examination. No new focal abnormality is seen. Electronically Signed   By: Alcide Clever M.D.   On: 11/05/2019 21:35   MR BRAIN WO CONTRAST  Result Date: 11/04/2019 CLINICAL DATA:  Bacteremia.  Rule out septic emboli. EXAM: MRI HEAD WITHOUT CONTRAST TECHNIQUE: Multiplanar, multiecho pulse sequences of the brain and surrounding structures were obtained without intravenous contrast. COMPARISON:  Head CT January 18, 2018. FINDINGS: Brain: No acute infarction, hemorrhage, hydrocephalus, extra-axial collection or mass lesion. The brain parenchyma has normal morphology and signal characteristics. Vascular: Normal flow voids. Skull and upper cervical spine: Diffuse decrease of the T1 signal within the  visualized upper cervical spine, calvarium and mandible  may represent red marrow reconversion. Correlate with CBC. Sinuses/Orbits: Minimal mucosal thickening of the right maxillary sinus. The orbits are maintained. Other: None. IMPRESSION: 1. No acute intracranial abnormality. Unremarkable MRI of the brain. 2. Diffuse decrease of the T1 signal within the visualized upper cervical spine, calvarium and mandible may represent red marrow reconversion. Correlate with CBC. Electronically Signed   By: Baldemar Lenis M.D.   On: 11/04/2019 20:52   MR THORACIC SPINE WO CONTRAST  Result Date: 11/04/2019 CLINICAL DATA:  Bacteremia.  Back pain. EXAM: MRI THORACIC AND LUMBAR SPINE WITHOUT CONTRAST TECHNIQUE: Multiplanar and multiecho pulse sequences of the thoracic and lumbar spine were obtained without intravenous contrast. The patient refused contrast administration. COMPARISON:  Chest CT yesterday. FINDINGS: MRI THORACIC SPINE FINDINGS Alignment:  Normal Vertebrae: Normal Cord:  Normal Paraspinal and other soft tissues: Small effusions layering dependently, right more than left, with dependent atelectasis in the lower lobes right more than left. Patchy infiltrates as were shown by CT. Disc levels: No degenerative disc disease. No stenosis of the canal or foramina. No evidence of discitis osteomyelitis or infected facet joint. MRI LUMBAR SPINE FINDINGS Segmentation:  5 lumbar type vertebral bodies. Alignment:  Normal Vertebrae:  Vertebral bodies are normal. Conus medullaris: Extends to the L1-2 level and appears normal. Paraspinal and other soft tissues: Negative Disc levels: No evidence of degenerative disc disease or disc space infection. Patient has some edema associated with the facet joints at L4-5, without frank joint effusions. This could be degenerative in nature or could reflect the earliest changes of facet joint infection. IMPRESSION: 1. No evidence of discitis osteomyelitis or infected facet joint in the thoracic region. 2. No evidence of degenerative  disc disease or disc space infection in the lumbar region. There is some edema associated with the facet joints at L4-5, without frank joint effusions. This could be degenerative in nature or could reflect the earliest changes of facet joint infection. 3. Small pleural effusions layering dependently, right more than left, with dependent atelectasis in the lower lobes right more than left. Focal pulmonary infiltrates as shown by CT yesterday. Electronically Signed   By: Paulina Fusi M.D.   On: 11/04/2019 15:34   MR LUMBAR SPINE WO CONTRAST  Result Date: 11/04/2019 CLINICAL DATA:  Bacteremia.  Back pain. EXAM: MRI THORACIC AND LUMBAR SPINE WITHOUT CONTRAST TECHNIQUE: Multiplanar and multiecho pulse sequences of the thoracic and lumbar spine were obtained without intravenous contrast. The patient refused contrast administration. COMPARISON:  Chest CT yesterday. FINDINGS: MRI THORACIC SPINE FINDINGS Alignment:  Normal Vertebrae: Normal Cord:  Normal Paraspinal and other soft tissues: Small effusions layering dependently, right more than left, with dependent atelectasis in the lower lobes right more than left. Patchy infiltrates as were shown by CT. Disc levels: No degenerative disc disease. No stenosis of the canal or foramina. No evidence of discitis osteomyelitis or infected facet joint. MRI LUMBAR SPINE FINDINGS Segmentation:  5 lumbar type vertebral bodies. Alignment:  Normal Vertebrae:  Vertebral bodies are normal. Conus medullaris: Extends to the L1-2 level and appears normal. Paraspinal and other soft tissues: Negative Disc levels: No evidence of degenerative disc disease or disc space infection. Patient has some edema associated with the facet joints at L4-5, without frank joint effusions. This could be degenerative in nature or could reflect the earliest changes of facet joint infection. IMPRESSION: 1. No evidence of discitis osteomyelitis or infected facet joint in the thoracic region. 2. No  evidence of  degenerative disc disease or disc space infection in the lumbar region. There is some edema associated with the facet joints at L4-5, without frank joint effusions. This could be degenerative in nature or could reflect the earliest changes of facet joint infection. 3. Small pleural effusions layering dependently, right more than left, with dependent atelectasis in the lower lobes right more than left. Focal pulmonary infiltrates as shown by CT yesterday. Electronically Signed   By: Paulina Fusi M.D.   On: 11/04/2019 15:34   CT ANKLE RIGHT W CONTRAST  Result Date: 11/04/2019 CLINICAL DATA:  Foot swelling bacteremia EXAM: CT OF THE RIGHT ANKLE WITH CONTRAST TECHNIQUE: Multidetector CT imaging of the right ankle was performed following the standard protocol during bolus administration of intravenous contrast. CONTRAST:  OMNIPAQUE IOHEXOL 300 MG/ML  SOLN COMPARISON:  None. FINDINGS: Bones/Joint/Cartilage No fracture or dislocation. No areas of cortical destruction or periosteal reaction are seen. The articular surfaces appear maintained. No large ankle joint effusion. Ligaments Suboptimally assessed by CT. Muscles and Tendons The muscles surrounding the ankle and foot appear to be intact without focal atrophy or tear. The flexor and extensor tendons are intact. The Achilles tendon is intact. Soft tissues: There is subcutaneous edema seen along the posterior heel pad with mild skin thickening. There also appears to be mildly heterogeneous density with thickening in the middle cord of the plantar fascia, however it is intact. No loculated fluid collections are noted. IMPRESSION: Findings which could be suggestive of diffuse cellulitis along the heel pad. No loculated fluid collections or definite evidence of osteomyelitis. Electronically Signed   By: Jonna Clark M.D.   On: 11/04/2019 21:45   CT FOOT LEFT W CONTRAST  Result Date: 11/04/2019 CLINICAL DATA:  Foot swelling bacteremia EXAM: CT OF THE RIGHT  ANKLE WITH CONTRAST TECHNIQUE: Multidetector CT imaging of the right ankle was performed following the standard protocol during bolus administration of intravenous contrast. CONTRAST:  OMNIPAQUE IOHEXOL 300 MG/ML  SOLN COMPARISON:  None. FINDINGS: Bones/Joint/Cartilage No fracture or dislocation. No areas of cortical destruction or periosteal reaction are seen. The articular surfaces appear maintained. No large ankle joint effusion. Ligaments Suboptimally assessed by CT. Muscles and Tendons The muscles surrounding the ankle and foot appear to be intact without focal atrophy or tear. The flexor and extensor tendons are intact. The Achilles tendon is intact. Soft tissues: There is subcutaneous edema seen along the posterior heel pad with mild skin thickening. There also appears to be mildly heterogeneous density with thickening in the middle cord of the plantar fascia, however it is intact. No loculated fluid collections are noted. IMPRESSION: Findings which could be suggestive of diffuse cellulitis along the heel pad. No loculated fluid collections or definite evidence of osteomyelitis. Electronically Signed   By: Jonna Clark M.D.   On: 11/04/2019 21:45   ECHOCARDIOGRAM COMPLETE  Result Date: 11/04/2019    ECHOCARDIOGRAM REPORT   Patient Name:   EMMANUELA GHAZI Date of Exam: 11/04/2019 Medical Rec #:  [id number]        Height:       62.0 in Accession #:    [id number]       Weight:       105.0 lb Date of Birth:  10-17-1990         BSA:          1.454 m Patient Age:    29 years         BP:  95/58 mmHg Patient Gender: F                HR:           97 bpm. Exam Location:  Inpatient Procedure: 2D Echo Indications:    Bacteremia  History:        Patient has no prior history of Echocardiogram examinations.                 Risk Factors:IV Drug abuse.  Sonographer:    Thurman Coyer RDCS (AE) Referring Phys: [id number] Teddy Spike IMPRESSIONS  1. Left ventricular ejection fraction, by estimation, is 60  to 65%. The left ventricle has normal function. The left ventricle has no regional wall motion abnormalities. Left ventricular diastolic parameters were normal.  2. Right ventricular systolic function There is an ill defined mass encompassing a large portion of the RV cavity. Recommend cardiac MRI for further evaulation.. The right ventricular size is normal. There is normal pulmonary artery systolic pressure. The estimated right ventricular systolic pressure is 18.4 mmHg.  3. The mitral valve is normal in structure. No evidence of mitral valve regurgitation. No evidence of mitral stenosis.  4. Possible vegetation on TV leaflet. Consider TEE for further evaulation. . The tricuspid valve is abnormal.  5. The aortic valve was not well visualized. Aortic valve regurgitation is not visualized. No aortic stenosis is present.  6. The inferior vena cava is normal in size with greater than 50% respiratory variability, suggesting right atrial pressure of 3 mmHg.Recommend Cardiac MRI to evaluated abnormality in RV cavity. ALso ecoh concerning for TV endocardidits. FINDINGS  Left Ventricle: Left ventricular ejection fraction, by estimation, is 60 to 65%. The left ventricle has normal function. The left ventricle has no regional wall motion abnormalities. The left ventricular internal cavity size was normal in size. There is  no left ventricular hypertrophy. Left ventricular diastolic parameters were normal. Normal left ventricular filling pressure. Right Ventricle: The right ventricular size is normal. No increase in right ventricular wall thickness. Right ventricular systolic function There is an ill defined mass encompassing a large portion of the RV cavity. Recommend cardiac MRI for further evaulation. There is normal pulmonary artery systolic pressure. The tricuspid regurgitant velocity is 1.96 m/s, and with an assumed right atrial pressure of 3 mmHg, the estimated right ventricular systolic pressure is 18.4 mmHg. Left  Atrium: Left atrial size was normal in size. Right Atrium: Right atrial size was normal in size. Pericardium: There is no evidence of pericardial effusion. Mitral Valve: The mitral valve is normal in structure. No evidence of mitral valve regurgitation. No evidence of mitral valve stenosis. Tricuspid Valve: Possible vegetation on TV leaflet. Consider TEE for further evaulation. The tricuspid valve is abnormal. Tricuspid valve regurgitation is mild . No evidence of tricuspid stenosis. Aortic Valve: The aortic valve was not well visualized. Aortic valve regurgitation is not visualized. No aortic stenosis is present. Pulmonic Valve: The pulmonic valve was normal in structure. Pulmonic valve regurgitation is not visualized. No evidence of pulmonic stenosis. Aorta: The aortic root is normal in size and structure. Venous: The inferior vena cava is normal in size with greater than 50% respiratory variability, suggesting right atrial pressure of 3 mmHg. IAS/Shunts: No atrial level shunt detected by color flow Doppler.  LEFT VENTRICLE PLAX 2D LVIDd:         3.59 cm  Diastology LVIDs:         2.31 cm  LV e' medial:    9.57 cm/s  LV PW:         0.61 cm  LV E/e' medial:  7.8 LV IVS:        0.80 cm  LV e' lateral:   8.70 cm/s LVOT diam:     2.10 cm  LV E/e' lateral: 8.5 LV SV:         55 LV SV Index:   38 LVOT Area:     3.46 cm  RIGHT VENTRICLE RV S prime:     12.70 cm/s TAPSE (M-mode): 2.0 cm LEFT ATRIUM             Index       RIGHT ATRIUM           Index LA diam:        1.60 cm 1.10 cm/m  RA Area:     13.70 cm LA Vol (A2C):   31.6 ml 21.74 ml/m RA Volume:   32.10 ml  22.08 ml/m LA Vol (A4C):   20.8 ml 14.31 ml/m LA Biplane Vol: 26.7 ml 18.37 ml/m  AORTIC VALVE LVOT Vmax:   90.90 cm/s LVOT Vmean:  55.500 cm/s LVOT VTI:    0.158 m  AORTA Ao Root diam: 2.70 cm MITRAL VALVE               TRICUSPID VALVE MV Area (PHT): 3.37 cm    TR Peak grad:   15.4 mmHg MV Decel Time: 225 msec    TR Vmax:        196.00 cm/s MV E velocity:  74.20 cm/s MV A velocity: 60.60 cm/s  SHUNTS MV E/A ratio:  1.22        Systemic VTI:  0.16 m                            Systemic Diam: 2.10 cm Armanda Magic MD Electronically signed by Armanda Magic MD Signature Date/Time: 11/04/2019/2:03:31 PM    Final     Cardiac Studies  TTE 11/04/2019 1. Left ventricular ejection fraction, by estimation, is 60 to 65%. The  left ventricle has normal function. The left ventricle has no regional  wall motion abnormalities. Left ventricular diastolic parameters were  normal.  2. Right ventricular systolic function There is an ill defined mass  encompassing a large portion of the RV cavity. Recommend cardiac MRI for  further evaulation.. The right ventricular size is normal. There is normal  pulmonary artery systolic pressure.  The estimated right ventricular systolic pressure is 18.4 mmHg.  3. The mitral valve is normal in structure. No evidence of mitral valve  regurgitation. No evidence of mitral stenosis.  4. Possible vegetation on TV leaflet. Consider TEE for further  evaulation. . The tricuspid valve is abnormal.  5. The aortic valve was not well visualized. Aortic valve regurgitation  is not visualized. No aortic stenosis is present.  6. The inferior vena cava is normal in size with greater than 50%  respiratory variability, suggesting right atrial pressure of 3  mmHg.Recommend Cardiac MRI to evaluated abnormality in RV cavity. ALso  ecoh concerning for TV endocardidits.   Patient Profile  ANNAMARIA SALAH is a 29 y.o. female with intravenous drug abuse who was admitted on 11/03/2019 with headache, fever and tachycardia found to have MRSA bacteremia.  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  She left AGAINST MEDICAL ADVICE.  She returned overnight to complete her therapy and to be further  evaluated for her endocarditis.  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  Admitted with MRSA bacteremia.  Evidence of septic emboli on lung CT.  MRI brain negative.  No osteomyelitis on MRI spine. -Echocardiogram with possibly small tricuspid valve vegetation.  No significant tricuspid regurgitation.  No significant murmur on exam. -She also has a mass in the RV.  Suspect this is an element of thrombus as well as vegetation. -Cardiac surgery has evaluated her.  I think this could be treated with angio VAC therapy.  She will need a TEE.  Cardiac surgery was aware of her.  They will discuss her case after her transesophageal echocardiogram. -For now would recommend continue heparin.  Suspect this is thrombus and vegetation.  No brain emboli detected.  Any decline in her condition or hypoxia would suggest mass mobilization and acute PE. Closely monitor her here.  Cardiology will follow remotely.  We will arrange for her to have a transesophageal echocardiogram tomorrow.  Please make her n.p.o. at midnight.  For questions or updates, please contact [facility] Please consult [email or web address] for contact info under   Time Spent with Patient: I have spent a total of 25 minutes with patient reviewing hospital notes, telemetry, EKGs, labs and examining the patient as well as establishing an assessment and plan that was discussed with the patient.  > 50% of time was spent in direct patient care.    Signed, Lenna Gilford. Flora Lipps, MD [place]  [facility]  11/06/2019 9:20 AM

## 2019-11-06 NOTE — Progress Notes (Signed)
ANTICOAGULATION CONSULT NOTE - Follow Up Consult  Pharmacy Consult for heparin Indication: RV mass  No Known Allergies  Patient Measurements: Weight: 48.7 kg (107 lb 5.8 oz) Heparin Dosing Weight: 50.4 kg  Vital Signs: Temp: 98.3 F (36.8 C) (10/24 1602) Temp Source: Oral (10/24 1602) BP: 116/71 (10/24 1602) Pulse Rate: 101 (10/24 0830)  Labs: Recent Labs    11/03/19 2240 11/03/19 2240 11/04/19 0434 11/04/19 0434 11/05/19 2014 11/06/19 0842 11/06/19 1716  HGB  --   --  9.3*   < > 9.9* 8.2*  --   HCT  --   --  27.9*  --  30.8* 24.5*  --   PLT  --   --  193  --  346 305  --   LABPROT  --   --  18.0*  --   --   --   --   INR  --   --  1.5*  --   --   --   --   HEPARINUNFRC  --   --   --   --   --  <0.10* <0.10*  CREATININE 0.77   < > 0.66  --  0.66 0.55  --   CKTOTAL 90  --   --   --   --   --   --   TROPONINIHS 14  --   --   --   --   --   --    < > = values in this interval not displayed.    Estimated Creatinine Clearance: 79.8 mL/min (by C-G formula based on SCr of 0.55 mg/dL).   Medications:  Scheduled:  . nicotine  21 mg Transdermal Daily  . sodium chloride flush  3 mL Intravenous Q12H   Infusions:  . sodium chloride 75 mL/hr at 11/06/19 1836  . sodium chloride    . heparin 1,050 Units/hr (11/06/19 1112)  . vancomycin 1,000 mg (11/06/19 1103)   PRN: acetaminophen **OR** acetaminophen, alum & mag hydroxide-simeth, hydrOXYzine, ketorolac, ondansetron **OR** ondansetron (ZOFRAN) IV   Anti-infectives (From admission, onward)   Start     Dose/Rate Route Frequency Ordered Stop   11/06/19 1100  vancomycin (VANCOCIN) IVPB 1000 mg/200 mL premix        1,000 mg 200 mL/hr over 60 Minutes Intravenous Every 8 hours 11/06/19 1056     11/05/19 2300  vancomycin (VANCOREADY) IVPB 750 mg/150 mL  Status:  Discontinued        750 mg 150 mL/hr over 60 Minutes Intravenous Every 12 hours 11/05/19 2217 11/06/19 1056      Assessment: 29 yo female admitted with sepsis 2/2  MRSA bacteremia, cellulitis of left foot, and septic emboli found to have possible RV mass. Pharmacy has been consulted to start heparin.   PTA the patient was not on anticoagluation. 10/23 the patient left AMA but returned later in the day and was readmitted. Pt was on prophylactic enox during the admission and Hgb dropped, platelets were stable and baseline INR was elevated at 1.5.  Heparin level remains subtherapeutic at <0.1, on 1050 units/hr. Hgb 8.2, plt 305. No s/sx of bleeding or infusion issues per nursing.   Goal of Therapy:  Heparin level 0.3-0.7 units/ml Monitor platelets by anticoagulation protocol: Yes   Plan:  Give heparin IV 1500 unit bolus x1 followed by 1200 units/hr infusion Obtain 6-hr heparin level Monitor daily heparin level and CBC Monitor for signs and symptoms of bleeding  Sherron Monday, PharmD, BCCCP Clinical Pharmacist  Phone:  [phone] 11/06/2019 6:53 PM  Please check AMION for all [facility] Pharmacy phone numbers After 10:00 PM, call Main Pharmacy [phone]

## 2019-11-07 ENCOUNTER — Inpatient Hospital Stay (HOSPITAL_COMMUNITY): Payer: Self-pay

## 2019-11-07 ENCOUNTER — Inpatient Hospital Stay (HOSPITAL_COMMUNITY): Payer: Self-pay | Admitting: Anesthesiology

## 2019-11-07 ENCOUNTER — Encounter (HOSPITAL_COMMUNITY)
Admission: EM | Discharge status: Against Medical Advice | Payer: Self-pay | Source: Home / Self Care | Attending: Internal Medicine

## 2019-11-07 ENCOUNTER — Encounter (HOSPITAL_COMMUNITY): Payer: Self-pay | Admitting: Internal Medicine

## 2019-11-07 DIAGNOSIS — I361 Nonrheumatic tricuspid (valve) insufficiency: Secondary | ICD-10-CM

## 2019-11-07 DIAGNOSIS — R7881 Bacteremia: Secondary | ICD-10-CM

## 2019-11-07 DIAGNOSIS — F1910 Other psychoactive substance abuse, uncomplicated: Secondary | ICD-10-CM

## 2019-11-07 DIAGNOSIS — B9562 Methicillin resistant Staphylococcus aureus infection as the cause of diseases classified elsewhere: Secondary | ICD-10-CM

## 2019-11-07 HISTORY — PX: TEE WITHOUT CARDIOVERSION: SHX5443

## 2019-11-07 LAB — BASIC METABOLIC PANEL
Anion gap: 9 (ref 5–15)
BUN: 7 mg/dL (ref 6–20)
CO2: 18 mmol/L — ABNORMAL LOW (ref 22–32)
Calcium: 7.4 mg/dL — ABNORMAL LOW (ref 8.9–10.3)
Chloride: 106 mmol/L (ref 98–111)
Creatinine, Ser: 0.51 mg/dL (ref 0.44–1.00)
GFR, Estimated: >60 mL/min (ref >60)
Glucose, Bld: 132 mg/dL — ABNORMAL HIGH (ref 70–99)
Potassium: 3.8 mmol/L (ref 3.5–5.1)
Sodium: 133 mmol/L — ABNORMAL LOW (ref 135–145)

## 2019-11-07 LAB — CBC
HCT: 23.6 % — ABNORMAL LOW (ref 36.0–46.0)
Hemoglobin: 7.7 g/dL — ABNORMAL LOW (ref 12.0–15.0)
MCH: 25.9 pg — ABNORMAL LOW (ref 26.0–34.0)
MCHC: 32.6 g/dL (ref 30.0–36.0)
MCV: 79.5 fL — ABNORMAL LOW (ref 80.0–100.0)
Platelets: 311 10*3/uL (ref 150–400)
RBC: 2.97 MIL/uL — ABNORMAL LOW (ref 3.87–5.11)
RDW: 14.2 % (ref 11.5–15.5)
WBC: 12.1 10*3/uL — ABNORMAL HIGH (ref 4.0–10.5)
nRBC: 0 % (ref 0.0–0.2)

## 2019-11-07 LAB — HEPARIN LEVEL (UNFRACTIONATED): Heparin Unfractionated: <0.1 IU/mL — ABNORMAL LOW (ref 0.30–0.70)

## 2019-11-07 SURGERY — ECHOCARDIOGRAM, TRANSESOPHAGEAL
Anesthesia: Monitor Anesthesia Care

## 2019-11-07 MED ORDER — BUTAMBEN-TETRACAINE-BENZOCAINE 2-2-14 % EX AERO
INHALATION_SPRAY | CUTANEOUS | Status: DC | PRN
  Administered 2019-11-07: 2 via TOPICAL

## 2019-11-07 MED ORDER — PROPOFOL 10 MG/ML IV BOLUS
INTRAVENOUS | Status: DC | PRN
  Administered 2019-11-07: 20 mg via INTRAVENOUS
  Administered 2019-11-07: 30 mg via INTRAVENOUS
  Administered 2019-11-07: 50 mg via INTRAVENOUS

## 2019-11-07 MED ORDER — PROPOFOL 500 MG/50ML IV EMUL
INTRAVENOUS | Status: DC | PRN
  Administered 2019-11-07: 200 ug/kg/min via INTRAVENOUS

## 2019-11-07 MED ORDER — ORITAVANCIN DIPHOSPHATE 400 MG IV SOLR
1200.0000 mg | Freq: Once | INTRAVENOUS | Status: DC
  Filled 2019-11-07: qty 120

## 2019-11-07 NOTE — Anesthesia Procedure Notes (Signed)
Procedure Name: MAC Date/Time: 11/07/2019 1:00 PM Performed by: Amadeo Garnet, CRNA Pre-anesthesia Checklist: Patient identified, Emergency Drugs available, Suction available and Patient being monitored Patient Re-evaluated:Patient Re-evaluated prior to induction Oxygen Delivery Method: Nasal cannula Preoxygenation: Pre-oxygenation with 100% oxygen Induction Type: IV induction Placement Confirmation: positive ETCO2 Dental Injury: Teeth and Oropharynx as per pre-operative assessment

## 2019-11-07 NOTE — Progress Notes (Signed)
Day of Surgery Procedure(s) (LRB): TRANSESOPHAGEAL ECHOCARDIOGRAM (TEE) (N/A) Subjective: Complains of pain in back, anxious. Mother afraid she is going to elope.  Objective: Vital signs in last 24 hours: Temp:  [98.3 F (36.8 C)-100.2 F (37.9 C)] 98.3 F (36.8 C) (10/25 1445) Pulse Rate:  [72-103] 96 (10/25 1445) Cardiac Rhythm: Normal sinus rhythm (10/25 0800) Resp:  [20-37] 20 (10/25 1445) BP: (102-127)/(52-84) 113/79 (10/25 1445) SpO2:  [95 %-100 %] 100 % (10/25 1445) Weight:  [49.3 kg] 49.3 kg (10/25 0430)  Hemodynamic parameters for last 24 hours:    Intake/Output from previous day: 10/24 0701 - 10/25 0700 In: 1069 [P.O.:1066; I.V.:3] Out: 500 [Urine:500] Intake/Output this shift: Total I/O In: 5970.6 [I.V.:3044; IV Piggyback:2926.7] Out: 100 [Urine:100]  General appearance: slowed mentation Heart: regular rate and rhythm, S1, S2 normal, no murmur Lungs: rhonchi bilat Extremities: no edema  Lab Results: Recent Labs    11/06/19 0842 11/07/19 0535  WBC 10.7* 12.1*  HGB 8.2* 7.7*  HCT 24.5* 23.6*  PLT 305 311   BMET:  Recent Labs    11/06/19 0842 11/07/19 0535  NA 131* 133*  K 3.3* 3.8  CL 103 106  CO2 20* 18*  GLUCOSE 117* 132*  BUN 9 7  CREATININE 0.55 0.51  CALCIUM 7.5* 7.4*    PT/INR: No results for input(s): LABPROT, INR in the last 72 hours. ABG No results found for: PHART, HCO3, TCO2, ACIDBASEDEF, O2SAT CBG (last 3)  No results for input(s): GLUCAP in the last 72 hours.  Assessment/Plan: S/P Procedure(s) (LRB): TRANSESOPHAGEAL ECHOCARDIOGRAM (TEE) (N/A)  MRSA tricuspid valve endocarditis.  I reviewed the TEE and there is a small vegetation on the tricuspid valve with moderate TR. There may be some vegetation or thrombus attached to the subvalvular apparatus below the leaflet and attached to endocardium. It does not look near as large as it did on the transthoracic echo and I suspect what was seen on the transthoracic echo was  ventricular muscle that was collapsed together. There is no sign of infection on the other valves. Followup BC negative so far. I would recommend continuing the course of antibiotics to completion as long as there is no unremitting sepsis, repeat positive BC, or progressive valve destruction. 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 I discussed this with the patient and her mother.   LOS: 2 days    Alleen Borne 11/07/2019

## 2019-11-07 NOTE — Progress Notes (Signed)
Pt stated they wanted their papers to leave AMA. MD was notified. All IVs taken out and paperwork signed and placed in pt chart. Pt educated on leaving AMA.

## 2019-11-07 NOTE — Discharge Summary (Signed)
Physician Discharge Summary  Amber [name] UJW:[id number] DOB: [birth year] DOA: 11/03/2019  PCP: Patient, No Pcp Per  Admit date: 11/03/2019 Discharge date: 11/07/2019  Admitted From:  Disposition:    Recommendations for Outpatient Follow-up:  1. None        Left AGAINST MEDICAL ADVICE   Brief/Interim Summary: 29 y.o. female past medical history significant for IVDA comes in for headache found febrile tachycardia bacteremia with  blood cultures on November 03, 2019 + for MRSA CT chest showed septic emboli with a left upper lobe cavitary nodule and small bilateral pleural effusion MRI showed some edema associated with the facet joint at L4-L5 but no effusion, show diffuse cellulitis along the heel pad no fluid collection or evidence of osteo.  2D echo was done  that showed an ill-defined mass in the right ventricular cavity of large size, and possible vegetation on the tricuspid valve, ID was consulted recommend to start IV vancomycin and repeat blood cultures on November 05, 2019, cardiology was consulted who recommended a cardiac MRI  Discharge Diagnoses:  Active Problems:   IV drug abuse (HCC)   Sepsis (HCC)   MRSA bacteremia   Septic embolism (HCC)   AKI (acute kidney injury) (HCC)   Hyponatremia   Cellulitis of left foot   Right ventricular mass   Acute bacterial endocarditis  Sepsis and on IV drug abuser due to cellulitis of the left foot and MRSA bacteremia/septic emboli: Blood cultures were positive x3 for MRSA. Repeated blood cultures ordered for November 05, 2019 MRI  Brain showed no acute findings. 2D echo showed possible vegetation high ventricular mass. Continue IV vancomycin discontinue cefepime.  Appreciate IDs assistance Continue tramadol, Toradol and Robaxin for pain. All narcotics were discontinued. Left against medical advice.  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 Laneta Simmers recommended  to consider anticoagulation.  MRI of the brain showed no acute findings.  Polysubstance abuse: Continue Suboxone for mild withdrawals. HIV is negative hepatitis C positive.  Hypovolemic hyponatremia: Continue IV fluid hydration.  Discharge Instructions   Allergies as of 11/05/2019   No Known Allergies     Medication List    ASK your doctor about these medications   acetaminophen 500 MG tablet Commonly known as: TYLENOL Take 1,000 mg by mouth every 6 (six) hours as needed.   ibuprofen 600 MG tablet Commonly known as: ADVIL Take 1 tablet (600 mg total) by mouth every 6 (six) hours as needed.   ondansetron 4 MG disintegrating tablet Commonly known as: Zofran ODT Take 1 tablet (4 mg total) by mouth every 8 (eight) hours as needed for nausea or vomiting.       No Known Allergies  Consultations:  Cardiology  CT surgery   Procedures/Studies: DG Chest 2 View  Result Date: 11/05/2019 CLINICAL DATA:  Back pain EXAM: CHEST - 2 VIEW COMPARISON:  11/03/2019 FINDINGS: Cardiac shadow is stable. Scattered nodules are identified throughout both lungs similar to that seen on prior CT examination is suspicious for septic emboli. No new pleural effusion is seen. No bony abnormality is noted. IMPRESSION: Scattered nodules consistent with septic emboli similar to that seen on prior CT examination. No new focal abnormality is seen. Electronically Signed   By: Alcide Clever M.D.   On: 11/05/2019 21:35   CT Angio Chest PE W and/or Wo Contrast  Result Date: 11/03/2019 CLINICAL DATA:  Hypotensive, heroin user EXAM: CT ANGIOGRAPHY CHEST WITH CONTRAST TECHNIQUE: Multidetector CT imaging of  the chest was performed using the standard protocol during bolus administration of intravenous contrast. Multiplanar CT image reconstructions and MIPs were obtained to evaluate the vascular anatomy. CONTRAST:  OMNIPAQUE IOHEXOL 350 MG/ML SOLN COMPARISON:  None. FINDINGS: Cardiovascular: Satisfactory  opacification of the pulmonary arteries to the segmental level. No evidence of pulmonary embolism. Normal heart size. No pericardial effusion. Thoracic aorta is normal in caliber. Mediastinum/Nodes: Mildly enlarged mediastinal and hilar lymph nodes are likely reactive. Thyroid and esophagus are unremarkable. Lungs/Pleura: Numerous nodules are present bilaterally with some demonstrating internal cavitation. Mild interlobular septal thickening. Mild bibasilar atelectasis. No pleural effusion or pneumothorax. Upper Abdomen: No acute abnormality. Musculoskeletal: Osseous structures are unremarkable. Review of the MIP images confirms the above findings. IMPRESSION: No acute pulmonary embolism. Pulmonary findings most consistent with septic emboli. Minor interstitial pulmonary edema. These results were called by telephone at the time of interpretation on 11/03/2019 at 11:56 am to provider ADAM CURATOLO , who verbally acknowledged these results. Electronically Signed   By: Guadlupe Spanish M.D.   On: 11/03/2019 12:01   MR BRAIN WO CONTRAST  Result Date: 11/04/2019 CLINICAL DATA:  Bacteremia.  Rule out septic emboli. EXAM: MRI HEAD WITHOUT CONTRAST TECHNIQUE: Multiplanar, multiecho pulse sequences of the brain and surrounding structures were obtained without intravenous contrast. COMPARISON:  Head CT January 18, 2018. FINDINGS: Brain: No acute infarction, hemorrhage, hydrocephalus, extra-axial collection or mass lesion. The brain parenchyma has normal morphology and signal characteristics. Vascular: Normal flow voids. Skull and upper cervical spine: Diffuse decrease of the T1 signal within the visualized upper cervical spine, calvarium and mandible may represent red marrow reconversion. Correlate with CBC. Sinuses/Orbits: Minimal mucosal thickening of the right maxillary sinus. The orbits are maintained. Other: None. IMPRESSION: 1. No acute intracranial abnormality. Unremarkable MRI of the brain. 2. Diffuse decrease of  the T1 signal within the visualized upper cervical spine, calvarium and mandible may represent red marrow reconversion. Correlate with CBC. Electronically Signed   By: Baldemar Lenis M.D.   On: 11/04/2019 20:52   MR THORACIC SPINE WO CONTRAST  Result Date: 11/04/2019 CLINICAL DATA:  Bacteremia.  Back pain. EXAM: MRI THORACIC AND LUMBAR SPINE WITHOUT CONTRAST TECHNIQUE: Multiplanar and multiecho pulse sequences of the thoracic and lumbar spine were obtained without intravenous contrast. The patient refused contrast administration. COMPARISON:  Chest CT yesterday. FINDINGS: MRI THORACIC SPINE FINDINGS Alignment:  Normal Vertebrae: Normal Cord:  Normal Paraspinal and other soft tissues: Small effusions layering dependently, right more than left, with dependent atelectasis in the lower lobes right more than left. Patchy infiltrates as were shown by CT. Disc levels: No degenerative disc disease. No stenosis of the canal or foramina. No evidence of discitis osteomyelitis or infected facet joint. MRI LUMBAR SPINE FINDINGS Segmentation:  5 lumbar type vertebral bodies. Alignment:  Normal Vertebrae:  Vertebral bodies are normal. Conus medullaris: Extends to the L1-2 level and appears normal. Paraspinal and other soft tissues: Negative Disc levels: No evidence of degenerative disc disease or disc space infection. Patient has some edema associated with the facet joints at L4-5, without frank joint effusions. This could be degenerative in nature or could reflect the earliest changes of facet joint infection. IMPRESSION: 1. No evidence of discitis osteomyelitis or infected facet joint in the thoracic region. 2. No evidence of degenerative disc disease or disc space infection in the lumbar region. There is some edema associated with the facet joints at L4-5, without frank joint effusions. This could be degenerative in nature or could  reflect the earliest changes of facet joint infection. 3. Small pleural  effusions layering dependently, right more than left, with dependent atelectasis in the lower lobes right more than left. Focal pulmonary infiltrates as shown by CT yesterday. Electronically Signed   By: Paulina Fusi M.D.   On: 11/04/2019 15:34   MR LUMBAR SPINE WO CONTRAST  Result Date: 11/04/2019 CLINICAL DATA:  Bacteremia.  Back pain. EXAM: MRI THORACIC AND LUMBAR SPINE WITHOUT CONTRAST TECHNIQUE: Multiplanar and multiecho pulse sequences of the thoracic and lumbar spine were obtained without intravenous contrast. The patient refused contrast administration. COMPARISON:  Chest CT yesterday. FINDINGS: MRI THORACIC SPINE FINDINGS Alignment:  Normal Vertebrae: Normal Cord:  Normal Paraspinal and other soft tissues: Small effusions layering dependently, right more than left, with dependent atelectasis in the lower lobes right more than left. Patchy infiltrates as were shown by CT. Disc levels: No degenerative disc disease. No stenosis of the canal or foramina. No evidence of discitis osteomyelitis or infected facet joint. MRI LUMBAR SPINE FINDINGS Segmentation:  5 lumbar type vertebral bodies. Alignment:  Normal Vertebrae:  Vertebral bodies are normal. Conus medullaris: Extends to the L1-2 level and appears normal. Paraspinal and other soft tissues: Negative Disc levels: No evidence of degenerative disc disease or disc space infection. Patient has some edema associated with the facet joints at L4-5, without frank joint effusions. This could be degenerative in nature or could reflect the earliest changes of facet joint infection. IMPRESSION: 1. No evidence of discitis osteomyelitis or infected facet joint in the thoracic region. 2. No evidence of degenerative disc disease or disc space infection in the lumbar region. There is some edema associated with the facet joints at L4-5, without frank joint effusions. This could be degenerative in nature or could reflect the earliest changes of facet joint infection. 3.  Small pleural effusions layering dependently, right more than left, with dependent atelectasis in the lower lobes right more than left. Focal pulmonary infiltrates as shown by CT yesterday. Electronically Signed   By: Paulina Fusi M.D.   On: 11/04/2019 15:34   CT ANKLE RIGHT W CONTRAST  Result Date: 11/04/2019 CLINICAL DATA:  Foot swelling bacteremia EXAM: CT OF THE RIGHT ANKLE WITH CONTRAST TECHNIQUE: Multidetector CT imaging of the right ankle was performed following the standard protocol during bolus administration of intravenous contrast. CONTRAST:  OMNIPAQUE IOHEXOL 300 MG/ML  SOLN COMPARISON:  None. FINDINGS: Bones/Joint/Cartilage No fracture or dislocation. No areas of cortical destruction or periosteal reaction are seen. The articular surfaces appear maintained. No large ankle joint effusion. Ligaments Suboptimally assessed by CT. Muscles and Tendons The muscles surrounding the ankle and foot appear to be intact without focal atrophy or tear. The flexor and extensor tendons are intact. The Achilles tendon is intact. Soft tissues: There is subcutaneous edema seen along the posterior heel pad with mild skin thickening. There also appears to be mildly heterogeneous density with thickening in the middle cord of the plantar fascia, however it is intact. No loculated fluid collections are noted. IMPRESSION: Findings which could be suggestive of diffuse cellulitis along the heel pad. No loculated fluid collections or definite evidence of osteomyelitis. Electronically Signed   By: Jonna Clark M.D.   On: 11/04/2019 21:45   CT FOOT LEFT W CONTRAST  Result Date: 11/04/2019 CLINICAL DATA:  Foot swelling bacteremia EXAM: CT OF THE RIGHT ANKLE WITH CONTRAST TECHNIQUE: Multidetector CT imaging of the right ankle was performed following the standard protocol during bolus administration of intravenous contrast. CONTRAST:  OMNIPAQUE IOHEXOL 300 MG/ML  SOLN COMPARISON:  None. FINDINGS:  Bones/Joint/Cartilage No fracture or dislocation. No areas of cortical destruction or periosteal reaction are seen. The articular surfaces appear maintained. No large ankle joint effusion. Ligaments Suboptimally assessed by CT. Muscles and Tendons The muscles surrounding the ankle and foot appear to be intact without focal atrophy or tear. The flexor and extensor tendons are intact. The Achilles tendon is intact. Soft tissues: There is subcutaneous edema seen along the posterior heel pad with mild skin thickening. There also appears to be mildly heterogeneous density with thickening in the middle cord of the plantar fascia, however it is intact. No loculated fluid collections are noted. IMPRESSION: Findings which could be suggestive of diffuse cellulitis along the heel pad. No loculated fluid collections or definite evidence of osteomyelitis. Electronically Signed   By: Jonna Clark M.D.   On: 11/04/2019 21:45   DG Chest Port 1 View  Result Date: 11/03/2019 CLINICAL DATA:  Sepsis, IV drug use EXAM: PORTABLE CHEST 1 VIEW COMPARISON:  09/07/2014 FINDINGS: Since the prior examination, there have developed multiple peripherally distributed pulmonary nodules within the lungs bilaterally. Given the patient's history of drug use, septic embolization should be considered. Atypical infection, such as fungal pneumonia, or metastatic disease are considered less likely, but could present similarly. No confluent pulmonary infiltrates. Cardiac size within normal limits. Pulmonary vascularity is normal. No acute bone abnormality. IMPRESSION: Interval development of multiple pulmonary nodules. This could be better assessed with CT examination, if indicated. Electronically Signed   By: Helyn Numbers MD   On: 11/03/2019 08:58   ECHOCARDIOGRAM COMPLETE  Result Date: 11/04/2019    ECHOCARDIOGRAM REPORT   Patient Name:   Amber [name] Date of Exam: 11/04/2019 Medical Rec #:  [id number]        Height:       62.0 in Accession  #:    [id number]       Weight:       105.0 lb Date of Birth:  [birth year]         BSA:          1.454 m Patient Age:    29 years         BP:           95/58 mmHg Patient Gender: F                HR:           97 bpm. Exam Location:  Inpatient Procedure: 2D Echo Indications:    Bacteremia  History:        Patient has no prior history of Echocardiogram examinations.                 Risk Factors:IV Drug abuse.  Sonographer:    Thurman Coyer RDCS (AE) Referring Phys: [id number] Teddy Spike IMPRESSIONS  1. Left ventricular ejection fraction, by estimation, is 60 to 65%. The left ventricle has normal function. The left ventricle has no regional wall motion abnormalities. Left ventricular diastolic parameters were normal.  2. Right ventricular systolic function There is an ill defined mass encompassing a large portion of the RV cavity. Recommend cardiac MRI for further evaulation.. The right ventricular size is normal. There is normal pulmonary artery systolic pressure. The estimated right ventricular systolic pressure is 18.4 mmHg.  3. The mitral valve is normal in structure. No evidence of mitral valve regurgitation. No evidence of mitral stenosis.  4. Possible vegetation on TV leaflet. Consider  TEE for further evaulation. . The tricuspid valve is abnormal.  5. The aortic valve was not well visualized. Aortic valve regurgitation is not visualized. No aortic stenosis is present.  6. The inferior vena cava is normal in size with greater than 50% respiratory variability, suggesting right atrial pressure of 3 mmHg.Recommend Cardiac MRI to evaluated abnormality in RV cavity. ALso ecoh concerning for TV endocardidits. FINDINGS  Left Ventricle: Left ventricular ejection fraction, by estimation, is 60 to 65%. The left ventricle has normal function. The left ventricle has no regional wall motion abnormalities. The left ventricular internal cavity size was normal in size. There is  no left ventricular hypertrophy. Left  ventricular diastolic parameters were normal. Normal left ventricular filling pressure. Right Ventricle: The right ventricular size is normal. No increase in right ventricular wall thickness. Right ventricular systolic function There is an ill defined mass encompassing a large portion of the RV cavity. Recommend cardiac MRI for further evaulation. There is normal pulmonary artery systolic pressure. The tricuspid regurgitant velocity is 1.96 m/s, and with an assumed right atrial pressure of 3 mmHg, the estimated right ventricular systolic pressure is 18.4 mmHg. Left Atrium: Left atrial size was normal in size. Right Atrium: Right atrial size was normal in size. Pericardium: There is no evidence of pericardial effusion. Mitral Valve: The mitral valve is normal in structure. No evidence of mitral valve regurgitation. No evidence of mitral valve stenosis. Tricuspid Valve: Possible vegetation on TV leaflet. Consider TEE for further evaulation. The tricuspid valve is abnormal. Tricuspid valve regurgitation is mild . No evidence of tricuspid stenosis. Aortic Valve: The aortic valve was not well visualized. Aortic valve regurgitation is not visualized. No aortic stenosis is present. Pulmonic Valve: The pulmonic valve was normal in structure. Pulmonic valve regurgitation is not visualized. No evidence of pulmonic stenosis. Aorta: The aortic root is normal in size and structure. Venous: The inferior vena cava is normal in size with greater than 50% respiratory variability, suggesting right atrial pressure of 3 mmHg. IAS/Shunts: No atrial level shunt detected by color flow Doppler.  LEFT VENTRICLE PLAX 2D LVIDd:         3.59 cm  Diastology LVIDs:         2.31 cm  LV e' medial:    9.57 cm/s LV PW:         0.61 cm  LV E/e' medial:  7.8 LV IVS:        0.80 cm  LV e' lateral:   8.70 cm/s LVOT diam:     2.10 cm  LV E/e' lateral: 8.5 LV SV:         55 LV SV Index:   38 LVOT Area:     3.46 cm  RIGHT VENTRICLE RV S prime:     12.70  cm/s TAPSE (M-mode): 2.0 cm LEFT ATRIUM             Index       RIGHT ATRIUM           Index LA diam:        1.60 cm 1.10 cm/m  RA Area:     13.70 cm LA Vol (A2C):   31.6 ml 21.74 ml/m RA Volume:   32.10 ml  22.08 ml/m LA Vol (A4C):   20.8 ml 14.31 ml/m LA Biplane Vol: 26.7 ml 18.37 ml/m  AORTIC VALVE LVOT Vmax:   90.90 cm/s LVOT Vmean:  55.500 cm/s LVOT VTI:    0.158 m  AORTA Ao Root diam:  2.70 cm MITRAL VALVE               TRICUSPID VALVE MV Area (PHT): 3.37 cm    TR Peak grad:   15.4 mmHg MV Decel Time: 225 msec    TR Vmax:        196.00 cm/s MV E velocity: 74.20 cm/s MV A velocity: 60.60 cm/s  SHUNTS MV E/A ratio:  1.22        Systemic VTI:  0.16 m                            Systemic Diam: 2.10 cm Armanda Magic MD Electronically signed by Armanda Magic MD Signature Date/Time: 11/04/2019/2:03:31 PM    Final     (Echo, Carotid, EGD, Colonoscopy, ERCP)    Subjective:   Discharge Exam: Vitals:   11/05/19 1116 11/05/19 1235  BP: 104/68 111/81  Pulse: (!) 101 (!) 103  Resp: (!) 34 20  Temp: 98.9 F (37.2 C) 99.2 F (37.3 C)  SpO2: 99% 98%   Vitals:   11/05/19 0600 11/05/19 0700 11/05/19 1116 11/05/19 1235  BP: 116/76 115/77 104/68 111/81  Pulse:  100 (!) 101 (!) 103  Resp: (!) 42 (!) 30 (!) 34 20  Temp:  98.9 F (37.2 C) 98.9 F (37.2 C) 99.2 F (37.3 C)  TempSrc:  Axillary Oral Oral  SpO2:  90% 99% 98%  Weight:      Height:        General: Pt is alert, awake, not in acute distress Cardiovascular: RRR, S1/S2 +, no rubs, no gallops Respiratory: CTA bilaterally, no wheezing, no rhonchi Abdominal: Soft, NT, ND, bowel sounds + Extremities: no edema, no cyanosis    The results of significant diagnostics from this hospitalization (including imaging, microbiology, ancillary and laboratory) are listed below for reference.     Microbiology: Recent Results (from the past 240 hour(s))  Urine culture     Status: Abnormal   Collection Time: 11/03/19  8:18 AM   Specimen: In/Out  Cath Urine  Result Value Ref Range Status   Specimen Description   Final    IN/OUT CATH URINE Performed at [facility], [address], [place], [place] [address]    Special Requests   Final    NONE Performed at [facility], [address], [place], [place] [address]    Culture >=100,000 COLONIES/mL ESCHERICHIA COLI (A)  Final   Report Status 11/05/2019 FINAL  Final   Organism ID, Bacteria ESCHERICHIA COLI (A)  Final      Susceptibility   Escherichia coli - MIC*    AMPICILLIN 4 SENSITIVE Sensitive     CEFAZOLIN <=4 SENSITIVE Sensitive     CEFTRIAXONE <=0.25 SENSITIVE Sensitive     CIPROFLOXACIN <=0.25 SENSITIVE Sensitive     GENTAMICIN <=1 SENSITIVE Sensitive     IMIPENEM <=0.25 SENSITIVE Sensitive     NITROFURANTOIN <=16 SENSITIVE Sensitive     TRIMETH/SULFA <=20 SENSITIVE Sensitive     AMPICILLIN/SULBACTAM <=2 SENSITIVE Sensitive     PIP/TAZO <=4 SENSITIVE Sensitive     * >=100,000 COLONIES/mL ESCHERICHIA COLI  Respiratory Panel by RT PCR (Flu A&B, Covid) - Nasopharyngeal Swab     Status: None   Collection Time: 11/03/19  9:13 AM   Specimen: Nasopharyngeal Swab  Result Value Ref Range Status   SARS Coronavirus 2 by RT PCR NEGATIVE NEGATIVE Final    Comment: (NOTE) SARS-CoV-2 target nucleic acids are  NOT DETECTED.  The SARS-CoV-2 RNA is generally detectable in upper respiratoy specimens during the acute phase of infection. The lowest concentration of SARS-CoV-2 viral copies this assay can detect is 131 copies/mL. A negative result does not preclude SARS-Cov-2 infection and should not be used as the sole basis for treatment or other patient management decisions. A negative result may occur with  improper specimen collection/handling, submission of specimen other than nasopharyngeal swab, presence of viral mutation(s) within the areas targeted by this assay, and inadequate number of viral copies (<131 copies/mL). A negative result must  be combined with clinical observations, patient history, and epidemiological information. The expected result is Negative.  Fact Sheet for Patients:  https://www.moore.com/  Fact Sheet for Healthcare Providers:  https://www.young.biz/  This test is no t yet approved or cleared by the [place] FDA and  has been authorized for detection and/or diagnosis of SARS-CoV-2 by FDA under an Emergency Use Authorization (EUA). This EUA will remain  in effect (meaning this test can be used) for the duration of the COVID-19 declaration under Section 564(b)(1) of the Act, 21 U.S.C. section 360bbb-3(b)(1), unless the authorization is terminated or revoked sooner.     Influenza A by PCR NEGATIVE NEGATIVE Final   Influenza B by PCR NEGATIVE NEGATIVE Final    Comment: (NOTE) The Xpert Xpress SARS-CoV-2/FLU/RSV assay is intended as an aid in  the diagnosis of influenza from Nasopharyngeal swab specimens and  should not be used as a sole basis for treatment. Nasal washings and  aspirates are unacceptable for Xpert Xpress SARS-CoV-2/FLU/RSV  testing.  Fact Sheet for Patients: https://www.moore.com/  Fact Sheet for Healthcare Providers: https://www.young.biz/  This test is not yet approved or cleared by the [place] FDA and  has been authorized for detection and/or diagnosis of SARS-CoV-2 by  FDA under an Emergency Use Authorization (EUA). This EUA will remain  in effect (meaning this test can be used) for the duration of the  Covid-19 declaration under Section 564(b)(1) of the Act, 21  U.S.C. section 360bbb-3(b)(1), unless the authorization is  terminated or revoked. Performed at [facility], [address], [place], [place] [address]   Blood Culture (routine x 2)     Status: Abnormal   Collection Time: 11/03/19  9:50 AM   Specimen: BLOOD RIGHT HAND  Result Value Ref Range Status    Specimen Description   Final    BLOOD RIGHT HAND Performed at [facility], [address], [place], [place] [address]    Special Requests   Final    BOTTLES DRAWN AEROBIC ONLY Blood Culture adequate volume Performed at [facility], [address], [place], [place] [address]    Culture  Setup Time   Final    AEROBIC BOTTLE ONLY GRAM POSITIVE COCCI CRITICAL VALUE NOTED.  VALUE IS CONSISTENT WITH PREVIOUSLY REPORTED AND CALLED VALUE.    Culture (A)  Final    STAPHYLOCOCCUS AUREUS SUSCEPTIBILITIES PERFORMED ON PREVIOUS CULTURE WITHIN THE LAST 5 DAYS. Performed at [facility], [address] [place], [place] [address]    Report Status 11/05/2019 FINAL  Final  Blood Culture (routine x 2)     Status: Abnormal   Collection Time: 11/03/19  9:50 AM   Specimen: BLOOD  Result Value Ref Range Status   Specimen Description   Final    BLOOD LEFT ARM Performed at [facility], [address], [place], [place] [address]    Special Requests  Final    BOTTLES DRAWN AEROBIC AND ANAEROBIC Blood Culture adequate volume Performed at [facility], [address], [place], [place] [address]    Culture  Setup Time   Final    IN BOTH AEROBIC AND ANAEROBIC BOTTLES GRAM POSITIVE COCCI CRITICAL VALUE NOTED.  VALUE IS CONSISTENT WITH PREVIOUSLY REPORTED AND CALLED VALUE.    Culture (A)  Final    STAPHYLOCOCCUS AUREUS SUSCEPTIBILITIES PERFORMED ON PREVIOUS CULTURE WITHIN THE LAST 5 DAYS. Performed at [facility], [address] [place], [place] [address]    Report Status 11/05/2019 FINAL  Final  Culture, blood (Routine X 2) w Reflex to ID Panel     Status: Abnormal   Collection Time: 11/03/19  9:52 AM   Specimen: BLOOD  Result Value Ref Range Status   Specimen Description   Final    BLOOD RIGHT ARM Performed at [facility], [address], [place], [place] [address]    Special Requests    Final    BOTTLES DRAWN AEROBIC ONLY Blood Culture adequate volume Performed at [facility], [address] [place], [place] [address]    Culture  Setup Time   Final    AEROBIC BOTTLE ONLY GRAM POSITIVE COCCI Organism ID to follow CRITICAL RESULT CALLED TO, READ BACK BY AND VERIFIED WITH:  PHARMD E JACKSON 11/03/19 AT 1157 SK Performed at [facility], [address] [place], [place] [address]    Culture METHICILLIN RESISTANT STAPHYLOCOCCUS AUREUS (A)  Final   Report Status 11/05/2019 FINAL  Final   Organism ID, Bacteria METHICILLIN RESISTANT STAPHYLOCOCCUS AUREUS  Final      Susceptibility   Methicillin resistant staphylococcus aureus - MIC*    CIPROFLOXACIN >=8 RESISTANT Resistant     ERYTHROMYCIN >=8 RESISTANT Resistant     GENTAMICIN <=0.5 SENSITIVE Sensitive     OXACILLIN >=4 RESISTANT Resistant     TETRACYCLINE <=1 SENSITIVE Sensitive     VANCOMYCIN <=0.5 SENSITIVE Sensitive     TRIMETH/SULFA 160 RESISTANT Resistant     CLINDAMYCIN >=8 RESISTANT Resistant     RIFAMPIN <=0.5 SENSITIVE Sensitive     Inducible Clindamycin NEGATIVE Sensitive     * METHICILLIN RESISTANT STAPHYLOCOCCUS AUREUS  Blood Culture ID Panel (Reflexed)     Status: Abnormal   Collection Time: 11/03/19  9:52 AM  Result Value Ref Range Status   Enterococcus faecalis NOT DETECTED NOT DETECTED Final   Enterococcus Faecium NOT DETECTED NOT DETECTED Final   Listeria monocytogenes NOT DETECTED NOT DETECTED Final   Staphylococcus species DETECTED (A) NOT DETECTED Final    Comment: CRITICAL RESULT CALLED TO, READ BACK BY AND VERIFIED WITH: PHARMD E JACKSON 11/03/19 AT 2357 SK    Staphylococcus aureus (BCID) DETECTED (A) NOT DETECTED Final    Comment: Methicillin (oxacillin)-resistant Staphylococcus aureus (MRSA). MRSA is predictably resistant to beta-lactam antibiotics (except ceftaroline). Preferred therapy is vancomycin unless clinically contraindicated. Patient requires contact  precautions if  hospitalized. CRITICAL RESULT CALLED TO, READ BACK BY AND VERIFIED WITH: PHARMD E JACKSON 11/03/19 AT 2357 SK    Staphylococcus epidermidis NOT DETECTED NOT DETECTED Final   Staphylococcus lugdunensis NOT DETECTED NOT DETECTED Final   Streptococcus species NOT DETECTED NOT DETECTED Final   Streptococcus agalactiae NOT DETECTED NOT DETECTED Final   Streptococcus pneumoniae NOT DETECTED NOT DETECTED Final   Streptococcus pyogenes NOT DETECTED NOT DETECTED Final   A.calcoaceticus-baumannii NOT DETECTED NOT DETECTED Final   Bacteroides fragilis NOT DETECTED NOT DETECTED  Final   Enterobacterales NOT DETECTED NOT DETECTED Final   Enterobacter cloacae complex NOT DETECTED NOT DETECTED Final   Escherichia coli NOT DETECTED NOT DETECTED Final   Klebsiella aerogenes NOT DETECTED NOT DETECTED Final   Klebsiella oxytoca NOT DETECTED NOT DETECTED Final   Klebsiella pneumoniae NOT DETECTED NOT DETECTED Final   Proteus species NOT DETECTED NOT DETECTED Final   Salmonella species NOT DETECTED NOT DETECTED Final   Serratia marcescens NOT DETECTED NOT DETECTED Final   Haemophilus influenzae NOT DETECTED NOT DETECTED Final   Neisseria meningitidis NOT DETECTED NOT DETECTED Final   Pseudomonas aeruginosa NOT DETECTED NOT DETECTED Final   Stenotrophomonas maltophilia NOT DETECTED NOT DETECTED Final   Candida albicans NOT DETECTED NOT DETECTED Final   Candida auris NOT DETECTED NOT DETECTED Final   Candida glabrata NOT DETECTED NOT DETECTED Final   Candida krusei NOT DETECTED NOT DETECTED Final   Candida parapsilosis NOT DETECTED NOT DETECTED Final   Candida tropicalis NOT DETECTED NOT DETECTED Final   Cryptococcus neoformans/gattii NOT DETECTED NOT DETECTED Final   Meth resistant mecA/C and MREJ DETECTED (A) NOT DETECTED Final    Comment: CRITICAL RESULT CALLED TO, READ BACK BY AND VERIFIED WITH: PHARMD E JACKSON 11/03/19 AT 2357 SK Performed at [facility], [address] [place], [place] [address]   Culture, blood (Routine X 2) w Reflex to ID Panel     Status: Abnormal (Preliminary result)   Collection Time: 11/05/19  8:29 AM   Specimen: BLOOD  Result Value Ref Range Status   Specimen Description BLOOD RIGHT ANTECUBITAL  Final   Special Requests   Final    BOTTLES DRAWN AEROBIC ONLY Blood Culture adequate volume   Culture  Setup Time   Final    GRAM POSITIVE COCCI IN CLUSTERS AEROBIC BOTTLE ONLY Organism ID to follow CRITICAL RESULT CALLED TO, READ BACK BY AND VERIFIED WITH: J. MILLEN PHARMD, AT 1124 11/06/19 Performed at [facility], [address], [place], [place] [address]    Culture STAPHYLOCOCCUS AUREUS (A)  Final   Report Status PENDING  Incomplete  Culture, blood (Routine X 2) w Reflex to ID Panel     Status: Abnormal (Preliminary result)   Collection Time: 11/05/19  8:29 AM   Specimen: BLOOD RIGHT HAND  Result Value Ref Range Status   Specimen Description BLOOD RIGHT HAND  Final   Special Requests   Final    BOTTLES DRAWN AEROBIC ONLY Blood Culture adequate volume   Culture  Setup Time   Final    GRAM POSITIVE COCCI IN CLUSTERS AEROBIC BOTTLE ONLY CRITICAL VALUE NOTED.  VALUE IS CONSISTENT WITH PREVIOUSLY REPORTED AND CALLED VALUE. Performed at [facility], [address], [place], [place] [address]    Culture STAPHYLOCOCCUS AUREUS (A)  Final   Report Status PENDING  Incomplete  Blood Culture ID Panel (Reflexed)     Status: Abnormal   Collection Time: 11/05/19  8:29 AM  Result Value Ref Range Status   Enterococcus faecalis NOT DETECTED NOT DETECTED Final   Enterococcus Faecium NOT DETECTED NOT DETECTED Final   Listeria monocytogenes NOT DETECTED NOT DETECTED Final   Staphylococcus species DETECTED (A) NOT DETECTED Final    Comment: CRITICAL RESULT CALLED TO, READ BACK BY AND VERIFIED WITH: J. MILLEN PHARMD, AT 1124 11/06/19 BY D. VANHOOK    Staphylococcus aureus (BCID) DETECTED (A) NOT DETECTED Final    Comment:  Methicillin (oxacillin)-resistant Staphylococcus aureus (MRSA). MRSA is predictably resistant  to beta-lactam antibiotics (except ceftaroline). Preferred therapy is vancomycin unless clinically contraindicated. Patient requires contact precautions if  hospitalized. CRITICAL RESULT CALLED TO, READ BACK BY AND VERIFIED WITH: J. MILLEN PHARMD, AT 1124 11/06/19 BY D. VANHOOK    Staphylococcus epidermidis NOT DETECTED NOT DETECTED Final   Staphylococcus lugdunensis NOT DETECTED NOT DETECTED Final   Streptococcus species NOT DETECTED NOT DETECTED Final   Streptococcus agalactiae NOT DETECTED NOT DETECTED Final   Streptococcus pneumoniae NOT DETECTED NOT DETECTED Final   Streptococcus pyogenes NOT DETECTED NOT DETECTED Final   A.calcoaceticus-baumannii NOT DETECTED NOT DETECTED Final   Bacteroides fragilis NOT DETECTED NOT DETECTED Final   Enterobacterales NOT DETECTED NOT DETECTED Final   Enterobacter cloacae complex NOT DETECTED NOT DETECTED Final   Escherichia coli NOT DETECTED NOT DETECTED Final   Klebsiella aerogenes NOT DETECTED NOT DETECTED Final   Klebsiella oxytoca NOT DETECTED NOT DETECTED Final   Klebsiella pneumoniae NOT DETECTED NOT DETECTED Final   Proteus species NOT DETECTED NOT DETECTED Final   Salmonella species NOT DETECTED NOT DETECTED Final   Serratia marcescens NOT DETECTED NOT DETECTED Final   Haemophilus influenzae NOT DETECTED NOT DETECTED Final   Neisseria meningitidis NOT DETECTED NOT DETECTED Final   Pseudomonas aeruginosa NOT DETECTED NOT DETECTED Final   Stenotrophomonas maltophilia NOT DETECTED NOT DETECTED Final   Candida albicans NOT DETECTED NOT DETECTED Final   Candida auris NOT DETECTED NOT DETECTED Final   Candida glabrata NOT DETECTED NOT DETECTED Final   Candida krusei NOT DETECTED NOT DETECTED Final   Candida parapsilosis NOT DETECTED NOT DETECTED Final   Candida tropicalis NOT DETECTED NOT DETECTED Final   Cryptococcus neoformans/gattii NOT DETECTED  NOT DETECTED Final   Meth resistant mecA/C and MREJ DETECTED (A) NOT DETECTED Final   Carbapenem resist OXA 48 LIKE NOT DETECTED NOT DETECTED Final   Carbapenem resistance VIM NOT DETECTED NOT DETECTED Final    Comment: Performed at [facility], [address], [place], [place] [address]  Blood culture (routine x 2)     Status: Abnormal (Preliminary result)   Collection Time: 11/05/19  9:47 PM   Specimen: BLOOD RIGHT ARM  Result Value Ref Range Status   Specimen Description BLOOD RIGHT ARM  Final   Special Requests   Final    BOTTLES DRAWN AEROBIC AND ANAEROBIC Blood Culture adequate volume   Culture  Setup Time   Final    GRAM POSITIVE COCCI IN CLUSTERS IN BOTH AEROBIC AND ANAEROBIC BOTTLES CRITICAL VALUE NOTED.  VALUE IS CONSISTENT WITH PREVIOUSLY REPORTED AND CALLED VALUE.    Culture (A)  Final    STAPHYLOCOCCUS AUREUS SUSCEPTIBILITIES TO FOLLOW Performed at [facility], [address] [place], [place] [address]    Report Status PENDING  Incomplete  Blood culture (routine x 2)     Status: Abnormal (Preliminary result)   Collection Time: 11/05/19 10:36 PM   Specimen: BLOOD  Result Value Ref Range Status   Specimen Description BLOOD SITE NOT SPECIFIED  Final   Special Requests   Final    BOTTLES DRAWN AEROBIC AND ANAEROBIC Blood Culture adequate volume   Culture  Setup Time   Final    GRAM POSITIVE COCCI IN CLUSTERS IN BOTH AEROBIC AND ANAEROBIC BOTTLES CRITICAL VALUE NOTED.  VALUE IS CONSISTENT WITH PREVIOUSLY REPORTED AND CALLED VALUE. Performed at [facility], [address], [place], [place] [address]    Culture STAPHYLOCOCCUS AUREUS (A)  Final   Report Status PENDING  Incomplete     Labs: BNP (last 3 results) No results for input(s): BNP in the last 8760 hours. Basic Metabolic Panel: Recent Labs  Lab 11/03/19 0950 11/03/19 0950 11/03/19 2240 11/04/19 0434 11/05/19 2014 11/06/19 0842 11/07/19 0535  NA 128*  123*   < > 131* 129* 131*  131* 133*  K 4.2  4.1   < > 3.8 3.5 3.4* 3.3* 3.8  CL 89*  86*   < > 95* 96* 99 103 106  CO2 22  22   < > 25 24 20* 20* 18*  GLUCOSE 88  92   < > 105* 134* 148* 117* 132*  BUN 39*  39*   < > 30* 22* 8 9 7   CREATININE 1.33*  1.33*   < > 0.77 0.66 0.66 0.55 0.51  CALCIUM 8.2*  8.0*   < > 7.9* 7.5* 8.3* 7.5* 7.4*  MG  --   --   --   --   --  1.8  --   PHOS 4.2  --  3.6 2.6  --   --   --    < > = values in this interval not displayed.   Liver Function Tests: Recent Labs  Lab 11/03/19 0950 11/03/19 2240 11/04/19 0434 11/04/19 1550 11/05/19 2014  AST 84*  --   --  29 32  ALT 36  --   --  23 22  ALKPHOS 133*  --   --  103 128*  BILITOT 1.0  --   --  0.8 0.4  PROT 7.5  --   --  6.0* 6.9  ALBUMIN 2.2*  2.2* 1.8* 1.5* 1.7* 1.8*   No results for input(s): LIPASE, AMYLASE in the last 168 hours. No results for input(s): AMMONIA in the last 168 hours. CBC: Recent Labs  Lab 11/03/19 0950 11/04/19 0434 11/05/19 2014 11/06/19 0842 11/07/19 0535  WBC 10.7* 10.3 12.7* 10.7* 12.1*  NEUTROABS 9.3*  --  11.0*  --   --   HGB 12.0 9.3* 9.9* 8.2* 7.7*  HCT 35.6* 27.9* 30.8* 24.5* 23.6*  MCV 79.3* 80.2 81.3 78.8* 79.5*  PLT 173 193 346 305 311   Cardiac Enzymes: Recent Labs  Lab 11/03/19 2240  CKTOTAL 90   BNP: Invalid input(s): POCBNP CBG: No results for input(s): GLUCAP in the last 168 hours. D-Dimer No results for input(s): DDIMER in the last 72 hours. Hgb A1c No results for input(s): HGBA1C in the last 72 hours. Lipid Profile No results for input(s): CHOL, HDL, LDLCALC, TRIG, CHOLHDL, LDLDIRECT in the last 72 hours. Thyroid function studies No results for input(s): TSH, T4TOTAL, T3FREE, THYROIDAB in the last 72 hours.  Invalid input(s): FREET3 Anemia work up No results for input(s): VITAMINB12, FOLATE, FERRITIN, TIBC, IRON, RETICCTPCT in the last 72 hours. Urinalysis    Component Value Date/Time   COLORURINE AMBER (A) 11/03/2019 0818   APPEARANCEUR CLOUDY (A)  11/03/2019 0818   APPEARANCEUR Turbid 01/27/2014 1811   LABSPEC 1.017 11/03/2019 0818   LABSPEC 1.011 01/27/2014 1811   PHURINE 5.0 11/03/2019 0818   GLUCOSEU NEGATIVE 11/03/2019 0818   GLUCOSEU Negative 01/27/2014 1811   HGBUR NEGATIVE 11/03/2019 0818   BILIRUBINUR NEGATIVE 11/03/2019 0818   BILIRUBINUR Negative 01/27/2014 1811   KETONESUR NEGATIVE 11/03/2019 0818   PROTEINUR NEGATIVE 11/03/2019 0818   UROBILINOGEN 0.2 10/14/2012 2010   NITRITE NEGATIVE 11/03/2019 0818   LEUKOCYTESUR MODERATE (A) 11/03/2019 0818   LEUKOCYTESUR Negative 01/27/2014 1811   Sepsis Labs Invalid input(s): PROCALCITONIN,  WBC,  LACTICIDVEN Microbiology Recent Results (from the past 240 hour(s))  Urine culture     Status: Abnormal   Collection Time: 11/03/19  8:18 AM   Specimen: In/Out Cath Urine  Result Value Ref Range Status   Specimen Description   Final    IN/OUT CATH URINE Performed at [facility], [address] [place], [place] [address]    Special Requests   Final    NONE Performed at [facility], [address], [place], [place] [address]    Culture >=100,000 COLONIES/mL ESCHERICHIA COLI (A)  Final   Report Status 11/05/2019 FINAL  Final   Organism ID, Bacteria ESCHERICHIA COLI (A)  Final      Susceptibility   Escherichia coli - MIC*    AMPICILLIN 4 SENSITIVE Sensitive     CEFAZOLIN <=4 SENSITIVE Sensitive     CEFTRIAXONE <=0.25 SENSITIVE Sensitive     CIPROFLOXACIN <=0.25 SENSITIVE Sensitive     GENTAMICIN <=1 SENSITIVE Sensitive     IMIPENEM <=0.25 SENSITIVE Sensitive     NITROFURANTOIN <=16 SENSITIVE Sensitive     TRIMETH/SULFA <=20 SENSITIVE Sensitive     AMPICILLIN/SULBACTAM <=2 SENSITIVE Sensitive     PIP/TAZO <=4 SENSITIVE Sensitive     * >=100,000 COLONIES/mL ESCHERICHIA COLI  Respiratory Panel by RT PCR (Flu A&B, Covid) - Nasopharyngeal Swab     Status: None   Collection Time: 11/03/19  9:13 AM   Specimen: Nasopharyngeal Swab   Result Value Ref Range Status   SARS Coronavirus 2 by RT PCR NEGATIVE NEGATIVE Final    Comment: (NOTE) SARS-CoV-2 target nucleic acids are NOT DETECTED.  The SARS-CoV-2 RNA is generally detectable in upper respiratoy specimens during the acute phase of infection. The lowest concentration of SARS-CoV-2 viral copies this assay can detect is 131 copies/mL. A negative result does not preclude SARS-Cov-2 infection and should not be used as the sole basis for treatment or other patient management decisions. A negative result may occur with  improper specimen collection/handling, submission of specimen other than nasopharyngeal swab, presence of viral mutation(s) within the areas targeted by this assay, and inadequate number of viral copies (<131 copies/mL). A negative result must be combined with clinical observations, patient history, and epidemiological information. The expected result is Negative.  Fact Sheet for Patients:  https://www.moore.com/  Fact Sheet for Healthcare Providers:  https://www.young.biz/  This test is no t yet approved or cleared by the [place] FDA and  has been authorized for detection and/or diagnosis of SARS-CoV-2 by FDA under an Emergency Use Authorization (EUA). This EUA will remain  in effect (meaning this test can be used) for the duration of the COVID-19 declaration under Section 564(b)(1) of the Act, 21 U.S.C. section 360bbb-3(b)(1), unless the authorization is terminated or revoked sooner.     Influenza A by PCR NEGATIVE NEGATIVE Final   Influenza B by PCR NEGATIVE NEGATIVE Final    Comment: (NOTE) The Xpert Xpress SARS-CoV-2/FLU/RSV assay is intended as an aid in  the diagnosis of influenza from Nasopharyngeal swab specimens and  should not be used as a sole basis for treatment. Nasal washings and  aspirates are unacceptable for Xpert Xpress SARS-CoV-2/FLU/RSV  testing.  Fact Sheet for  Patients: https://www.moore.com/  Fact Sheet for Healthcare Providers: https://www.young.biz/  This test is not yet approved or cleared by the [place] FDA and  has been authorized for detection and/or diagnosis of SARS-CoV-2 by  FDA under an Emergency Use Authorization (EUA). This EUA will remain  in effect (meaning this test can be used) for the duration of the  Covid-19 declaration under Section 564(b)(1) of the Act, 21  U.S.C. section 360bbb-3(b)(1), unless the authorization is  terminated or revoked. Performed at [facility], [address], [place], [place] [address]   Blood Culture (routine x 2)     Status: Abnormal   Collection Time: 11/03/19  9:50 AM   Specimen: BLOOD RIGHT HAND  Result Value Ref Range Status   Specimen Description   Final    BLOOD RIGHT HAND Performed at [facility], [address] [place], [place] [address]    Special Requests   Final    BOTTLES DRAWN AEROBIC ONLY Blood Culture adequate volume Performed at [facility], [address] [place], [place] [address]    Culture  Setup Time   Final    AEROBIC BOTTLE ONLY GRAM POSITIVE COCCI CRITICAL VALUE NOTED.  VALUE IS CONSISTENT WITH PREVIOUSLY REPORTED AND CALLED VALUE.    Culture (A)  Final    STAPHYLOCOCCUS AUREUS SUSCEPTIBILITIES PERFORMED ON PREVIOUS CULTURE WITHIN THE LAST 5 DAYS. Performed at [facility], [address], [place], [place] [address]    Report Status 11/05/2019 FINAL  Final  Blood Culture (routine x 2)     Status: Abnormal   Collection Time: 11/03/19  9:50 AM   Specimen: BLOOD  Result Value Ref Range Status   Specimen Description   Final    BLOOD LEFT ARM Performed at [facility], [address], [place], [place] [address]    Special Requests   Final    BOTTLES DRAWN AEROBIC AND ANAEROBIC Blood Culture adequate volume Performed at [facility], [address], [place], [place] [address]    Culture  Setup Time   Final    IN BOTH AEROBIC AND ANAEROBIC BOTTLES GRAM POSITIVE COCCI CRITICAL VALUE NOTED.  VALUE IS CONSISTENT WITH PREVIOUSLY REPORTED AND CALLED VALUE.    Culture (A)  Final    STAPHYLOCOCCUS AUREUS SUSCEPTIBILITIES PERFORMED ON PREVIOUS CULTURE WITHIN THE LAST 5 DAYS. Performed at [facility], [address], [place], [place] [address]    Report Status 11/05/2019 FINAL  Final  Culture, blood (Routine X 2) w Reflex to ID Panel     Status: Abnormal   Collection Time: 11/03/19  9:52 AM   Specimen: BLOOD  Result Value Ref Range Status   Specimen Description   Final    BLOOD RIGHT ARM Performed at [facility], [address], [place], [place] [address]    Special Requests   Final    BOTTLES DRAWN AEROBIC ONLY Blood Culture adequate volume Performed at [facility], [address], [place], [place] [address]    Culture  Setup Time   Final    AEROBIC BOTTLE ONLY GRAM POSITIVE COCCI Organism ID to follow CRITICAL RESULT CALLED TO, READ BACK BY AND VERIFIED WITH:  PHARMD E JACKSON 11/03/19 AT 1157 SK Performed at [facility], [address] [place], [place] [address]    Culture METHICILLIN RESISTANT STAPHYLOCOCCUS AUREUS (A)  Final   Report Status 11/05/2019 FINAL  Final   Organism ID, Bacteria METHICILLIN RESISTANT STAPHYLOCOCCUS AUREUS  Final      Susceptibility   Methicillin resistant staphylococcus aureus - MIC*    CIPROFLOXACIN >=8 RESISTANT Resistant     ERYTHROMYCIN >=8 RESISTANT Resistant     GENTAMICIN <=0.5 SENSITIVE Sensitive  OXACILLIN >=4 RESISTANT Resistant     TETRACYCLINE <=1 SENSITIVE Sensitive     VANCOMYCIN <=0.5 SENSITIVE Sensitive     TRIMETH/SULFA 160 RESISTANT Resistant     CLINDAMYCIN >=8 RESISTANT Resistant     RIFAMPIN <=0.5 SENSITIVE Sensitive     Inducible Clindamycin NEGATIVE Sensitive     * METHICILLIN  RESISTANT STAPHYLOCOCCUS AUREUS  Blood Culture ID Panel (Reflexed)     Status: Abnormal   Collection Time: 11/03/19  9:52 AM  Result Value Ref Range Status   Enterococcus faecalis NOT DETECTED NOT DETECTED Final   Enterococcus Faecium NOT DETECTED NOT DETECTED Final   Listeria monocytogenes NOT DETECTED NOT DETECTED Final   Staphylococcus species DETECTED (A) NOT DETECTED Final    Comment: CRITICAL RESULT CALLED TO, READ BACK BY AND VERIFIED WITH: PHARMD E JACKSON 11/03/19 AT 2357 SK    Staphylococcus aureus (BCID) DETECTED (A) NOT DETECTED Final    Comment: Methicillin (oxacillin)-resistant Staphylococcus aureus (MRSA). MRSA is predictably resistant to beta-lactam antibiotics (except ceftaroline). Preferred therapy is vancomycin unless clinically contraindicated. Patient requires contact precautions if  hospitalized. CRITICAL RESULT CALLED TO, READ BACK BY AND VERIFIED WITH: PHARMD E JACKSON 11/03/19 AT 2357 SK    Staphylococcus epidermidis NOT DETECTED NOT DETECTED Final   Staphylococcus lugdunensis NOT DETECTED NOT DETECTED Final   Streptococcus species NOT DETECTED NOT DETECTED Final   Streptococcus agalactiae NOT DETECTED NOT DETECTED Final   Streptococcus pneumoniae NOT DETECTED NOT DETECTED Final   Streptococcus pyogenes NOT DETECTED NOT DETECTED Final   A.calcoaceticus-baumannii NOT DETECTED NOT DETECTED Final   Bacteroides fragilis NOT DETECTED NOT DETECTED Final   Enterobacterales NOT DETECTED NOT DETECTED Final   Enterobacter cloacae complex NOT DETECTED NOT DETECTED Final   Escherichia coli NOT DETECTED NOT DETECTED Final   Klebsiella aerogenes NOT DETECTED NOT DETECTED Final   Klebsiella oxytoca NOT DETECTED NOT DETECTED Final   Klebsiella pneumoniae NOT DETECTED NOT DETECTED Final   Proteus species NOT DETECTED NOT DETECTED Final   Salmonella species NOT DETECTED NOT DETECTED Final   Serratia marcescens NOT DETECTED NOT DETECTED Final   Haemophilus influenzae NOT  DETECTED NOT DETECTED Final   Neisseria meningitidis NOT DETECTED NOT DETECTED Final   Pseudomonas aeruginosa NOT DETECTED NOT DETECTED Final   Stenotrophomonas maltophilia NOT DETECTED NOT DETECTED Final   Candida albicans NOT DETECTED NOT DETECTED Final   Candida auris NOT DETECTED NOT DETECTED Final   Candida glabrata NOT DETECTED NOT DETECTED Final   Candida krusei NOT DETECTED NOT DETECTED Final   Candida parapsilosis NOT DETECTED NOT DETECTED Final   Candida tropicalis NOT DETECTED NOT DETECTED Final   Cryptococcus neoformans/gattii NOT DETECTED NOT DETECTED Final   Meth resistant mecA/C and MREJ DETECTED (A) NOT DETECTED Final    Comment: CRITICAL RESULT CALLED TO, READ BACK BY AND VERIFIED WITH: PHARMD E JACKSON 11/03/19 AT 2357 SK Performed at [facility], [address], [place], [place] [address]   Culture, blood (Routine X 2) w Reflex to ID Panel     Status: Abnormal (Preliminary result)   Collection Time: 11/05/19  8:29 AM   Specimen: BLOOD  Result Value Ref Range Status   Specimen Description BLOOD RIGHT ANTECUBITAL  Final   Special Requests   Final    BOTTLES DRAWN AEROBIC ONLY Blood Culture adequate volume   Culture  Setup Time   Final    GRAM POSITIVE COCCI IN CLUSTERS AEROBIC BOTTLE ONLY Organism ID to follow CRITICAL RESULT CALLED TO, READ BACK BY  AND VERIFIED WITH: J. MILLEN PHARMD, AT 1124 11/06/19 Performed at [facility], [address] [place], [place] [address]    Culture STAPHYLOCOCCUS AUREUS (A)  Final   Report Status PENDING  Incomplete  Culture, blood (Routine X 2) w Reflex to ID Panel     Status: Abnormal (Preliminary result)   Collection Time: 11/05/19  8:29 AM   Specimen: BLOOD RIGHT HAND  Result Value Ref Range Status   Specimen Description BLOOD RIGHT HAND  Final   Special Requests   Final    BOTTLES DRAWN AEROBIC ONLY Blood Culture adequate volume   Culture  Setup Time   Final    GRAM POSITIVE COCCI IN CLUSTERS AEROBIC BOTTLE  ONLY CRITICAL VALUE NOTED.  VALUE IS CONSISTENT WITH PREVIOUSLY REPORTED AND CALLED VALUE. Performed at [facility], [address] [place], [place] [id number]    Culture STAPHYLOCOCCUS AUREUS (A)  Final   Report Status PENDING  Incomplete  Blood Culture ID Panel (Reflexed)     Status: Abnormal   Collection Time: 11/05/19  8:29 AM  Result Value Ref Range Status   Enterococcus faecalis NOT DETECTED NOT DETECTED Final   Enterococcus Faecium NOT DETECTED NOT DETECTED Final   Listeria monocytogenes NOT DETECTED NOT DETECTED Final   Staphylococcus species DETECTED (A) NOT DETECTED Final    Comment: CRITICAL RESULT CALLED TO, READ BACK BY AND VERIFIED WITH: J. MILLEN PHARMD, AT 1124 11/06/19 BY D. VANHOOK    Staphylococcus aureus (BCID) DETECTED (A) NOT DETECTED Final    Comment: Methicillin (oxacillin)-resistant Staphylococcus aureus (MRSA). MRSA is predictably resistant to beta-lactam antibiotics (except ceftaroline). Preferred therapy is vancomycin unless clinically contraindicated. Patient requires contact precautions if  hospitalized. CRITICAL RESULT CALLED TO, READ BACK BY AND VERIFIED WITH: J. MILLEN PHARMD, AT 1124 11/06/19 BY D. VANHOOK    Staphylococcus epidermidis NOT DETECTED NOT DETECTED Final   Staphylococcus lugdunensis NOT DETECTED NOT DETECTED Final   Streptococcus species NOT DETECTED NOT DETECTED Final   Streptococcus agalactiae NOT DETECTED NOT DETECTED Final   Streptococcus pneumoniae NOT DETECTED NOT DETECTED Final   Streptococcus pyogenes NOT DETECTED NOT DETECTED Final   A.calcoaceticus-baumannii NOT DETECTED NOT DETECTED Final   Bacteroides fragilis NOT DETECTED NOT DETECTED Final   Enterobacterales NOT DETECTED NOT DETECTED Final   Enterobacter cloacae complex NOT DETECTED NOT DETECTED Final   Escherichia coli NOT DETECTED NOT DETECTED Final   Klebsiella aerogenes NOT DETECTED NOT DETECTED Final   Klebsiella oxytoca NOT DETECTED NOT DETECTED Final    Klebsiella pneumoniae NOT DETECTED NOT DETECTED Final   Proteus species NOT DETECTED NOT DETECTED Final   Salmonella species NOT DETECTED NOT DETECTED Final   Serratia marcescens NOT DETECTED NOT DETECTED Final   Haemophilus influenzae NOT DETECTED NOT DETECTED Final   Neisseria meningitidis NOT DETECTED NOT DETECTED Final   Pseudomonas aeruginosa NOT DETECTED NOT DETECTED Final   Stenotrophomonas maltophilia NOT DETECTED NOT DETECTED Final   Candida albicans NOT DETECTED NOT DETECTED Final   Candida auris NOT DETECTED NOT DETECTED Final   Candida glabrata NOT DETECTED NOT DETECTED Final   Candida krusei NOT DETECTED NOT DETECTED Final   Candida parapsilosis NOT DETECTED NOT DETECTED Final   Candida tropicalis NOT DETECTED NOT DETECTED Final   Cryptococcus neoformans/gattii NOT DETECTED NOT DETECTED Final   Meth resistant mecA/C and MREJ DETECTED (A) NOT DETECTED Final   Carbapenem resist OXA 48 LIKE NOT DETECTED NOT DETECTED Final   Carbapenem resistance VIM NOT DETECTED NOT DETECTED  Final    Comment: Performed at [facility], [address], [place], [place] [address]  Blood culture (routine x 2)     Status: Abnormal (Preliminary result)   Collection Time: 11/05/19  9:47 PM   Specimen: BLOOD RIGHT ARM  Result Value Ref Range Status   Specimen Description BLOOD RIGHT ARM  Final   Special Requests   Final    BOTTLES DRAWN AEROBIC AND ANAEROBIC Blood Culture adequate volume   Culture  Setup Time   Final    GRAM POSITIVE COCCI IN CLUSTERS IN BOTH AEROBIC AND ANAEROBIC BOTTLES CRITICAL VALUE NOTED.  VALUE IS CONSISTENT WITH PREVIOUSLY REPORTED AND CALLED VALUE.    Culture (A)  Final    STAPHYLOCOCCUS AUREUS SUSCEPTIBILITIES TO FOLLOW Performed at [facility], [address], [place], [place] [address]    Report Status PENDING  Incomplete  Blood culture (routine x 2)     Status: Abnormal (Preliminary result)   Collection Time: 11/05/19 10:36 PM   Specimen: BLOOD   Result Value Ref Range Status   Specimen Description BLOOD SITE NOT SPECIFIED  Final   Special Requests   Final    BOTTLES DRAWN AEROBIC AND ANAEROBIC Blood Culture adequate volume   Culture  Setup Time   Final    GRAM POSITIVE COCCI IN CLUSTERS IN BOTH AEROBIC AND ANAEROBIC BOTTLES CRITICAL VALUE NOTED.  VALUE IS CONSISTENT WITH PREVIOUSLY REPORTED AND CALLED VALUE. Performed at [facility], [address], [place], [place] [address]    Culture STAPHYLOCOCCUS AUREUS (A)  Final   Report Status PENDING  Incomplete     Time coordinating discharge: Over 30 minutes  SIGNED:   Marinda Elk, MD  [facility] 11/07/2019, 10:02 AM Pager   If 7PM-7AM, please contact night-coverage [email or web address] Password [email or web address]

## 2019-11-07 NOTE — CV Procedure (Signed)
TEE: Anesthesia: Propofol  See full report in [place] No SBE/vegetations on PV, MV, AV 1 cm mobile vegetation on TV with moderate to severe TR ? rhind of infection extending to chords, papillary muscle and RV free wall  Suspect cardiac MRI would visualize sub valvular apparatus and RV cavity Best and indicate if tissue is abnormal , thrombus or other   Amber Haws MD [facility]

## 2019-11-07 NOTE — Progress Notes (Signed)
  Echocardiogram Echocardiogram Transesophageal has been performed.  Amber [name] 11/07/2019, 1:37 PM

## 2019-11-07 NOTE — Progress Notes (Signed)
ANTICOAGULATION CONSULT NOTE - Follow Up Consult  Pharmacy Consult for heparin Indication: RV mass   No Known Allergies  Patient Measurements: Weight: 49.3 kg (108 lb 11 oz) Heparin Dosing Weight: 50.4 kg  Vital Signs: Temp: 98.5 F (36.9 C) (10/25 0745) Temp Source: Oral (10/25 0745) BP: 127/81 (10/25 0745) Pulse Rate: 103 (10/25 0745)  Labs: Recent Labs    11/05/19 2014 11/05/19 2014 11/06/19 0842 11/06/19 1716 11/07/19 0535  HGB 9.9*   < > 8.2*  --  7.7*  HCT 30.8*  --  24.5*  --  23.6*  PLT 346  --  305  --  311  HEPARINUNFRC  --   --  <0.10* <0.10* <0.10*  CREATININE 0.66  --  0.55  --  0.51   < > = values in this interval not displayed.    Estimated Creatinine Clearance: 80.8 mL/min (by C-G formula based on SCr of 0.51 mg/dL).   Medications:  Scheduled:  . nicotine  21 mg Transdermal Daily  . sodium chloride flush  3 mL Intravenous Q12H   Infusions:  . sodium chloride 75 mL/hr at 11/07/19 0907  . sodium chloride    . heparin 1,200 Units/hr (11/06/19 1924)  . vancomycin 1,000 mg (11/07/19 0356)   PRN: acetaminophen **OR** acetaminophen, alum & mag hydroxide-simeth, fentaNYL (SUBLIMAZE) injection, hydrOXYzine, ketorolac, ondansetron **OR** ondansetron (ZOFRAN) IV   Anti-infectives (From admission, onward)   Start     Dose/Rate Route Frequency Ordered Stop   11/06/19 1100  vancomycin (VANCOCIN) IVPB 1000 mg/200 mL premix        1,000 mg 200 mL/hr over 60 Minutes Intravenous Every 8 hours 11/06/19 1056     11/05/19 2300  vancomycin (VANCOREADY) IVPB 750 mg/150 mL  Status:  Discontinued        750 mg 150 mL/hr over 60 Minutes Intravenous Every 12 hours 11/05/19 2217 11/06/19 1056      Assessment: 29 yo female admitted with sepsis 2/2 MRSA bacteremia, cellulitis of left foot, and septic emboli 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 Pharmacy has been consulted to start heparin.   PTA the patient was not on  anticoagluation. 10/23 the patient left AMA but returned later in the day and was readmitted. Pt was on prophylactic enox during the admission and Hgb dropped, platelets were stable and baseline INR was elevated at 1.5.  Heparin level remains subtherapeutic at <0.1, on 1200 units/hr. IV line checked my self - running fine - but location just changed, no swelling at IV site, of note pump beeping and pt sleeping on arm - could be impairing flow - will increase drip slightly and recheck  Hgb slight drop 9>7.7, plt stable 300s. No s/sx of bleeding  Goal of Therapy:  Heparin level 0.3-0.7 units/ml Monitor platelets by anticoagulation protocol: Yes   Plan:  Increase heparin drip 1400 uts/hr  Draw HL in 6hours  Monitor daily heparin level and CBC Monitor for signs and symptoms of bleeding   Leota Sauers Pharm.D. CPP, BCPS Clinical Pharmacist [phone] 11/07/2019 9:49 AM    Please check AMION for all [facility] phone numbers After 10:00 PM, call Main Pharmacy [phone]

## 2019-11-07 NOTE — Progress Notes (Signed)
[facility]  Date of Admission:  11/05/2019     Total days of antibiotics 5         ASSESSMENT:  Ms. Mcniel has disseminated MRSA infection with septic emboli, tricuspid valve endocarditis and bacteremia. Blood cultures from earlier today are without growth in <12 hours. 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 Cardiac MRI recommended. She has disseminated MRSA infection  Discussed the plan of care with Ms. Rubert and her mother who was at the bedside. Continue to monitor cultures for clearance of bacteremia. Please do not place any central lines prior to clearance of bacteremia. Remains at high risk for elopement. She has used methadone in the past for opioid use disorder and may benefit following control of her acute pain secondary to infection.   PLAN:  1. Continue vancomycin.  2. Monitor renal function and vancomycin levels.  3. Monitor cultures for clearance of bacteremia. 4. Await further cardiology recommendations for MRI or for CVTS evaluation. 5. Hold central line placement pending clearance of bacteremia.  6. Pain management per primary team.  Principal Problem:   Bacterial endocarditis Active Problems:   IV drug abuse (HCC)   Sepsis (HCC)   MRSA bacteremia   Septic embolism (HCC)   Hyponatremia   Right ventricular mass   . [MAR Hold] nicotine  21 mg Transdermal Daily  . [MAR Hold] sodium chloride flush  3 mL Intravenous Q12H    SUBJECTIVE:  Mildly elevated temperature of 100.2 today with stable WBC count. TEE today shows tricuspid valve vegetation. Having back pain.   No Known Allergies   Review of Systems: Review of Systems  Constitutional: Negative for chills, fever and weight loss.  Respiratory: Negative for cough, shortness of breath and wheezing.   Cardiovascular: Negative for chest pain and leg swelling.  Gastrointestinal: Negative for abdominal pain, constipation, diarrhea, nausea and  vomiting.  Musculoskeletal: Positive for back pain.  Skin: Negative for rash.      OBJECTIVE: Vitals:   11/07/19 0430 11/07/19 0745 11/07/19 1157 11/07/19 1325  BP: 117/84 127/81 126/76 (!) 102/52  Pulse: (!) 103 (!) 103 86 79  Resp: 20 20 (!) 28 20  Temp: 98.4 F (36.9 C) 98.5 F (36.9 C) 99.4 F (37.4 C) 100.2 F (37.9 C)  TempSrc: Oral Oral Oral Temporal  SpO2: 95% 98%  98%  Weight: 49.3 kg      Body mass index is 19.88 kg/m.  Physical Exam Constitutional:      General: She is not in acute distress.    Appearance: She is well-developed. She is ill-appearing.  Cardiovascular:     Rate and Rhythm: Regular rhythm. Tachycardia present.     Heart sounds: Normal heart sounds.  Pulmonary:     Effort: Pulmonary effort is normal.     Breath sounds: Normal breath sounds.  Skin:    General: Skin is warm and dry.  Neurological:     Mental Status: She is alert and oriented to person, place, and time.  Psychiatric:        Behavior: Behavior normal.        Thought Content: Thought content normal.        Judgment: Judgment normal.     Lab Results Lab Results  Component Value Date   WBC 12.1 (H) 11/07/2019   HGB 7.7 (L) 11/07/2019   HCT 23.6 (L) 11/07/2019   MCV 79.5 (L) 11/07/2019   PLT 311 11/07/2019    Lab Results  Component Value Date   CREATININE 0.51 11/07/2019   BUN 7 11/07/2019   NA 133 (L) 11/07/2019   K 3.8 11/07/2019   CL 106 11/07/2019   CO2 18 (L) 11/07/2019    Lab Results  Component Value Date   ALT 22 11/05/2019   AST 32 11/05/2019   ALKPHOS 128 (H) 11/05/2019   BILITOT 0.4 11/05/2019     Microbiology: Recent Results (from the past 240 hour(s))  Urine culture     Status: Abnormal   Collection Time: 11/03/19  8:18 AM   Specimen: In/Out Cath Urine  Result Value Ref Range Status   Specimen Description   Final    IN/OUT CATH URINE Performed at [facility], [address], [place], [place] [address]    Special Requests    Final    NONE Performed at [facility], [address] [place], [place] [address]    Culture >=100,000 COLONIES/mL ESCHERICHIA COLI (A)  Final   Report Status 11/05/2019 FINAL  Final   Organism ID, Bacteria ESCHERICHIA COLI (A)  Final      Susceptibility   Escherichia coli - MIC*    AMPICILLIN 4 SENSITIVE Sensitive     CEFAZOLIN <=4 SENSITIVE Sensitive     CEFTRIAXONE <=0.25 SENSITIVE Sensitive     CIPROFLOXACIN <=0.25 SENSITIVE Sensitive     GENTAMICIN <=1 SENSITIVE Sensitive     IMIPENEM <=0.25 SENSITIVE Sensitive     NITROFURANTOIN <=16 SENSITIVE Sensitive     TRIMETH/SULFA <=20 SENSITIVE Sensitive     AMPICILLIN/SULBACTAM <=2 SENSITIVE Sensitive     PIP/TAZO <=4 SENSITIVE Sensitive     * >=100,000 COLONIES/mL ESCHERICHIA COLI  Respiratory Panel by RT PCR (Flu A&B, Covid) - Nasopharyngeal Swab     Status: None   Collection Time: 11/03/19  9:13 AM   Specimen: Nasopharyngeal Swab  Result Value Ref Range Status   SARS Coronavirus 2 by RT PCR NEGATIVE NEGATIVE Final    Comment: (NOTE) SARS-CoV-2 target nucleic acids are NOT DETECTED.  The SARS-CoV-2 RNA is generally detectable in upper respiratoy specimens during the acute phase of infection. The lowest concentration of SARS-CoV-2 viral copies this assay can detect is 131 copies/mL. A negative result does not preclude SARS-Cov-2 infection and should not be used as the sole basis for treatment or other patient management decisions. A negative result may occur with  improper specimen collection/handling, submission of specimen other than nasopharyngeal swab, presence of viral mutation(s) within the areas targeted by this assay, and inadequate number of viral copies (<131 copies/mL). A negative result must be combined with clinical observations, patient history, and epidemiological information. The expected result is Negative.  Fact Sheet for Patients:  https://www.moore.com/  Fact  Sheet for Healthcare Providers:  https://www.young.biz/  This test is no t yet approved or cleared by the [place] FDA and  has been authorized for detection and/or diagnosis of SARS-CoV-2 by FDA under an Emergency Use Authorization (EUA). This EUA will remain  in effect (meaning this test can be used) for the duration of the COVID-19 declaration under Section 564(b)(1) of the Act, 21 U.S.C. section 360bbb-3(b)(1), unless the authorization is terminated or revoked sooner.     Influenza A by PCR NEGATIVE NEGATIVE Final   Influenza B by PCR NEGATIVE NEGATIVE Final    Comment: (NOTE) The Xpert Xpress SARS-CoV-2/FLU/RSV assay is intended as an aid in  the diagnosis of influenza from Nasopharyngeal swab specimens and  should not be used as a sole basis  for treatment. Nasal washings and  aspirates are unacceptable for Xpert Xpress SARS-CoV-2/FLU/RSV  testing.  Fact Sheet for Patients: https://www.moore.com/  Fact Sheet for Healthcare Providers: https://www.young.biz/  This test is not yet approved or cleared by the [place] FDA and  has been authorized for detection and/or diagnosis of SARS-CoV-2 by  FDA under an Emergency Use Authorization (EUA). This EUA will remain  in effect (meaning this test can be used) for the duration of the  Covid-19 declaration under Section 564(b)(1) of the Act, 21  U.S.C. section 360bbb-3(b)(1), unless the authorization is  terminated or revoked. Performed at [facility], [address], [place], [place] [address]   Blood Culture (routine x 2)     Status: Abnormal   Collection Time: 11/03/19  9:50 AM   Specimen: BLOOD RIGHT HAND  Result Value Ref Range Status   Specimen Description   Final    BLOOD RIGHT HAND Performed at [facility], [address], [place], [place] [address]    Special Requests   Final    BOTTLES DRAWN AEROBIC ONLY Blood  Culture adequate volume Performed at [facility], [address] [place], [place] [address]    Culture  Setup Time   Final    AEROBIC BOTTLE ONLY GRAM POSITIVE COCCI CRITICAL VALUE NOTED.  VALUE IS CONSISTENT WITH PREVIOUSLY REPORTED AND CALLED VALUE.    Culture (A)  Final    STAPHYLOCOCCUS AUREUS SUSCEPTIBILITIES PERFORMED ON PREVIOUS CULTURE WITHIN THE LAST 5 DAYS. Performed at [facility], [address] [place], [place] [address]    Report Status 11/05/2019 FINAL  Final  Blood Culture (routine x 2)     Status: Abnormal   Collection Time: 11/03/19  9:50 AM   Specimen: BLOOD  Result Value Ref Range Status   Specimen Description   Final    BLOOD LEFT ARM Performed at [facility], [address] [place], [place] [address]    Special Requests   Final    BOTTLES DRAWN AEROBIC AND ANAEROBIC Blood Culture adequate volume Performed at [facility], [address] [place], [place] [address]    Culture  Setup Time   Final    IN BOTH AEROBIC AND ANAEROBIC BOTTLES GRAM POSITIVE COCCI CRITICAL VALUE NOTED.  VALUE IS CONSISTENT WITH PREVIOUSLY REPORTED AND CALLED VALUE.    Culture (A)  Final    STAPHYLOCOCCUS AUREUS SUSCEPTIBILITIES PERFORMED ON PREVIOUS CULTURE WITHIN THE LAST 5 DAYS. Performed at [facility], [address] [place], [place] [address]    Report Status 11/05/2019 FINAL  Final  Culture, blood (Routine X 2) w Reflex to ID Panel     Status: Abnormal   Collection Time: 11/03/19  9:52 AM   Specimen: BLOOD  Result Value Ref Range Status   Specimen Description   Final    BLOOD RIGHT ARM Performed at [facility], [address] [place], [place] [address]    Special Requests   Final    BOTTLES DRAWN AEROBIC ONLY Blood Culture adequate volume Performed at [facility], [address] [place], [place] [address]    Culture  Setup Time   Final    AEROBIC  BOTTLE ONLY GRAM POSITIVE COCCI Organism ID to follow CRITICAL RESULT CALLED TO, READ BACK BY AND VERIFIED WITH:  PHARMD E JACKSON 11/03/19 AT 1157 SK Performed at [facility], [address] [place], [place] [address]  Culture METHICILLIN RESISTANT STAPHYLOCOCCUS AUREUS (A)  Final   Report Status 11/05/2019 FINAL  Final   Organism ID, Bacteria METHICILLIN RESISTANT STAPHYLOCOCCUS AUREUS  Final      Susceptibility   Methicillin resistant staphylococcus aureus - MIC*    CIPROFLOXACIN >=8 RESISTANT Resistant     ERYTHROMYCIN >=8 RESISTANT Resistant     GENTAMICIN <=0.5 SENSITIVE Sensitive     OXACILLIN >=4 RESISTANT Resistant     TETRACYCLINE <=1 SENSITIVE Sensitive     VANCOMYCIN <=0.5 SENSITIVE Sensitive     TRIMETH/SULFA 160 RESISTANT Resistant     CLINDAMYCIN >=8 RESISTANT Resistant     RIFAMPIN <=0.5 SENSITIVE Sensitive     Inducible Clindamycin NEGATIVE Sensitive     * METHICILLIN RESISTANT STAPHYLOCOCCUS AUREUS  Blood Culture ID Panel (Reflexed)     Status: Abnormal   Collection Time: 11/03/19  9:52 AM  Result Value Ref Range Status   Enterococcus faecalis NOT DETECTED NOT DETECTED Final   Enterococcus Faecium NOT DETECTED NOT DETECTED Final   Listeria monocytogenes NOT DETECTED NOT DETECTED Final   Staphylococcus species DETECTED (A) NOT DETECTED Final    Comment: CRITICAL RESULT CALLED TO, READ BACK BY AND VERIFIED WITH: PHARMD E JACKSON 11/03/19 AT 2357 SK    Staphylococcus aureus (BCID) DETECTED (A) NOT DETECTED Final    Comment: Methicillin (oxacillin)-resistant Staphylococcus aureus (MRSA). MRSA is predictably resistant to beta-lactam antibiotics (except ceftaroline). Preferred therapy is vancomycin unless clinically contraindicated. Patient requires contact precautions if  hospitalized. CRITICAL RESULT CALLED TO, READ BACK BY AND VERIFIED WITH: PHARMD E JACKSON 11/03/19 AT 2357 SK    Staphylococcus epidermidis NOT DETECTED NOT DETECTED Final    Staphylococcus lugdunensis NOT DETECTED NOT DETECTED Final   Streptococcus species NOT DETECTED NOT DETECTED Final   Streptococcus agalactiae NOT DETECTED NOT DETECTED Final   Streptococcus pneumoniae NOT DETECTED NOT DETECTED Final   Streptococcus pyogenes NOT DETECTED NOT DETECTED Final   A.calcoaceticus-baumannii NOT DETECTED NOT DETECTED Final   Bacteroides fragilis NOT DETECTED NOT DETECTED Final   Enterobacterales NOT DETECTED NOT DETECTED Final   Enterobacter cloacae complex NOT DETECTED NOT DETECTED Final   Escherichia coli NOT DETECTED NOT DETECTED Final   Klebsiella aerogenes NOT DETECTED NOT DETECTED Final   Klebsiella oxytoca NOT DETECTED NOT DETECTED Final   Klebsiella pneumoniae NOT DETECTED NOT DETECTED Final   Proteus species NOT DETECTED NOT DETECTED Final   Salmonella species NOT DETECTED NOT DETECTED Final   Serratia marcescens NOT DETECTED NOT DETECTED Final   Haemophilus influenzae NOT DETECTED NOT DETECTED Final   Neisseria meningitidis NOT DETECTED NOT DETECTED Final   Pseudomonas aeruginosa NOT DETECTED NOT DETECTED Final   Stenotrophomonas maltophilia NOT DETECTED NOT DETECTED Final   Candida albicans NOT DETECTED NOT DETECTED Final   Candida auris NOT DETECTED NOT DETECTED Final   Candida glabrata NOT DETECTED NOT DETECTED Final   Candida krusei NOT DETECTED NOT DETECTED Final   Candida parapsilosis NOT DETECTED NOT DETECTED Final   Candida tropicalis NOT DETECTED NOT DETECTED Final   Cryptococcus neoformans/gattii NOT DETECTED NOT DETECTED Final   Meth resistant mecA/C and MREJ DETECTED (A) NOT DETECTED Final    Comment: CRITICAL RESULT CALLED TO, READ BACK BY AND VERIFIED WITH: PHARMD E JACKSON 11/03/19 AT 2357 SK Performed at [facility], [address] [place], [place] [address]   Culture, blood (Routine X 2) w Reflex to ID Panel     Status: Abnormal (Preliminary result)   Collection Time: 11/05/19  8:29 AM  Specimen: BLOOD  Result Value Ref  Range Status   Specimen Description BLOOD RIGHT ANTECUBITAL  Final   Special Requests   Final    BOTTLES DRAWN AEROBIC ONLY Blood Culture adequate volume   Culture  Setup Time   Final    GRAM POSITIVE COCCI IN CLUSTERS AEROBIC BOTTLE ONLY Organism ID to follow CRITICAL RESULT CALLED TO, READ BACK BY AND VERIFIED WITH: J. MILLEN PHARMD, AT 1124 11/06/19 Performed at [facility], [address] [place], [place] [address]    Culture STAPHYLOCOCCUS AUREUS (A)  Final   Report Status PENDING  Incomplete  Culture, blood (Routine X 2) w Reflex to ID Panel     Status: Abnormal (Preliminary result)   Collection Time: 11/05/19  8:29 AM   Specimen: BLOOD RIGHT HAND  Result Value Ref Range Status   Specimen Description BLOOD RIGHT HAND  Final   Special Requests   Final    BOTTLES DRAWN AEROBIC ONLY Blood Culture adequate volume   Culture  Setup Time   Final    GRAM POSITIVE COCCI IN CLUSTERS AEROBIC BOTTLE ONLY CRITICAL VALUE NOTED.  VALUE IS CONSISTENT WITH PREVIOUSLY REPORTED AND CALLED VALUE. Performed at [facility], [address] [place], [place] [address]    Culture STAPHYLOCOCCUS AUREUS (A)  Final   Report Status PENDING  Incomplete  Blood Culture ID Panel (Reflexed)     Status: Abnormal   Collection Time: 11/05/19  8:29 AM  Result Value Ref Range Status   Enterococcus faecalis NOT DETECTED NOT DETECTED Final   Enterococcus Faecium NOT DETECTED NOT DETECTED Final   Listeria monocytogenes NOT DETECTED NOT DETECTED Final   Staphylococcus species DETECTED (A) NOT DETECTED Final    Comment: CRITICAL RESULT CALLED TO, READ BACK BY AND VERIFIED WITH: J. MILLEN PHARMD, AT 1124 11/06/19 BY D. VANHOOK    Staphylococcus aureus (BCID) DETECTED (A) NOT DETECTED Final    Comment: Methicillin (oxacillin)-resistant Staphylococcus aureus (MRSA). MRSA is predictably resistant to beta-lactam antibiotics (except ceftaroline). Preferred therapy is vancomycin unless clinically  contraindicated. Patient requires contact precautions if  hospitalized. CRITICAL RESULT CALLED TO, READ BACK BY AND VERIFIED WITH: J. MILLEN PHARMD, AT 1124 11/06/19 BY D. VANHOOK    Staphylococcus epidermidis NOT DETECTED NOT DETECTED Final   Staphylococcus lugdunensis NOT DETECTED NOT DETECTED Final   Streptococcus species NOT DETECTED NOT DETECTED Final   Streptococcus agalactiae NOT DETECTED NOT DETECTED Final   Streptococcus pneumoniae NOT DETECTED NOT DETECTED Final   Streptococcus pyogenes NOT DETECTED NOT DETECTED Final   A.calcoaceticus-baumannii NOT DETECTED NOT DETECTED Final   Bacteroides fragilis NOT DETECTED NOT DETECTED Final   Enterobacterales NOT DETECTED NOT DETECTED Final   Enterobacter cloacae complex NOT DETECTED NOT DETECTED Final   Escherichia coli NOT DETECTED NOT DETECTED Final   Klebsiella aerogenes NOT DETECTED NOT DETECTED Final   Klebsiella oxytoca NOT DETECTED NOT DETECTED Final   Klebsiella pneumoniae NOT DETECTED NOT DETECTED Final   Proteus species NOT DETECTED NOT DETECTED Final   Salmonella species NOT DETECTED NOT DETECTED Final   Serratia marcescens NOT DETECTED NOT DETECTED Final   Haemophilus influenzae NOT DETECTED NOT DETECTED Final   Neisseria meningitidis NOT DETECTED NOT DETECTED Final   Pseudomonas aeruginosa NOT DETECTED NOT DETECTED Final   Stenotrophomonas maltophilia NOT DETECTED NOT DETECTED Final   Candida albicans NOT DETECTED NOT DETECTED Final   Candida auris NOT DETECTED NOT DETECTED Final   Candida glabrata NOT DETECTED NOT DETECTED Final   Candida  krusei NOT DETECTED NOT DETECTED Final   Candida parapsilosis NOT DETECTED NOT DETECTED Final   Candida tropicalis NOT DETECTED NOT DETECTED Final   Cryptococcus neoformans/gattii NOT DETECTED NOT DETECTED Final   Meth resistant mecA/C and MREJ DETECTED (A) NOT DETECTED Final   Carbapenem resist OXA 48 LIKE NOT DETECTED NOT DETECTED Final   Carbapenem resistance VIM NOT DETECTED NOT  DETECTED Final    Comment: Performed at [facility], [address] [place], [place] [address]  Blood culture (routine x 2)     Status: Abnormal (Preliminary result)   Collection Time: 11/05/19  9:47 PM   Specimen: BLOOD RIGHT ARM  Result Value Ref Range Status   Specimen Description BLOOD RIGHT ARM  Final   Special Requests   Final    BOTTLES DRAWN AEROBIC AND ANAEROBIC Blood Culture adequate volume   Culture  Setup Time   Final    GRAM POSITIVE COCCI IN CLUSTERS IN BOTH AEROBIC AND ANAEROBIC BOTTLES CRITICAL VALUE NOTED.  VALUE IS CONSISTENT WITH PREVIOUSLY REPORTED AND CALLED VALUE.    Culture (A)  Final    STAPHYLOCOCCUS AUREUS SUSCEPTIBILITIES TO FOLLOW Performed at [facility], [address] [place], [place] [address]    Report Status PENDING  Incomplete  Blood culture (routine x 2)     Status: Abnormal (Preliminary result)   Collection Time: 11/05/19 10:36 PM   Specimen: BLOOD  Result Value Ref Range Status   Specimen Description BLOOD SITE NOT SPECIFIED  Final   Special Requests   Final    BOTTLES DRAWN AEROBIC AND ANAEROBIC Blood Culture adequate volume   Culture  Setup Time   Final    GRAM POSITIVE COCCI IN CLUSTERS IN BOTH AEROBIC AND ANAEROBIC BOTTLES CRITICAL VALUE NOTED.  VALUE IS CONSISTENT WITH PREVIOUSLY REPORTED AND CALLED VALUE. Performed at [facility], [address] [place], [place] [address]    Culture STAPHYLOCOCCUS AUREUS (A)  Final   Report Status PENDING  Incomplete  Culture, blood (Routine X 2) w Reflex to ID Panel     Status: None (Preliminary result)   Collection Time: 11/07/19  5:20 AM   Specimen: BLOOD RIGHT ARM  Result Value Ref Range Status   Specimen Description BLOOD RIGHT ARM  Final   Special Requests   Final    BOTTLES DRAWN AEROBIC ONLY Blood Culture results may not be optimal due to an excessive volume of blood received in culture bottles   Culture   Final    NO GROWTH < 12 HOURS Performed at [facility], [address] [place], [place] [address]    Report Status PENDING  Incomplete  Culture, blood (Routine X 2) w Reflex to ID Panel     Status: None (Preliminary result)   Collection Time: 11/07/19  5:35 AM   Specimen: BLOOD RIGHT HAND  Result Value Ref Range Status   Specimen Description BLOOD RIGHT HAND  Final   Special Requests   Final    BOTTLES DRAWN AEROBIC ONLY Blood Culture adequate volume   Culture   Final    NO GROWTH < 12 HOURS Performed at [facility], [address] [place], [place] [address]    Report Status PENDING  Incomplete     Marcos EkeGreg Antwan Pandya, NP [facility]  11/07/2019  1:33 PM

## 2019-11-07 NOTE — Progress Notes (Signed)
PROGRESS NOTE    Amber [name]  ZOX:[id number] DOB: August 16, 1990 DOA: 11/05/2019 PCP: Patient, No Pcp Per    Chief Complaint  Patient presents with  . Back Pain    Brief Narrative:  29 year old lady with prior history of MRSA bacteremia, endocarditis, septic emboli, right ventricular mass presents to ED last night after leaving AMA yesterday morning.  Patient has a history of IVDU initially came in for headache, fever tachycardia was found to have MRSA bacteremia.  CT chest showed septic emboli with left upper lobe cavitary nodule and small bilateral effusions.  Echocardiogram done showed ill-defined mass in the right ventricular cavity and possible vegetation on the tricuspid valve.  Cardiology, cardiothoracic surgery and infectious disease are on board. Patient was restarted on vancomycin and IV heparin and she is scheduled for TEE today.  Pt seen and examined at bedside, . No new complaints.   Assessment & Plan:   Principal Problem:   Bacterial endocarditis Active Problems:   IV drug abuse (HCC)   Sepsis (HCC)   MRSA bacteremia   Septic embolism (HCC)   Hyponatremia   Right ventricular mass   Sepsis secondary to MRSA bacteremia/septic emboli: Repeat Blood cultures grew MRSA.  We started the patient on IV heparin and IV vancomycin.  Repeat blood cultures were positive for MRSA.  She is scheduled for TEE today. Cardiothoracic surgery on board. Continue with Toradol and Robaxin for pain.    Polysubstance abuse TOC will be consulted for resources and counseling.    Mild Hyponatremia:  Sodium has improved to 133.    Hypokalemia Replaced.   Malnutrition secondary to acute illness Nutritionist will be consulted.  DVT prophylaxis: Heparin GTT Code Status: (Full code Family Communication: MOM at bedside.  Disposition:   Status is: Inpatient  Remains inpatient appropriate because:IV treatments appropriate due to intensity of illness or inability to take  PO   Dispo: The patient is from: Home              Anticipated d/c is to: Home              Anticipated d/c date is: > 3 days              Patient currently is not medically stable to d/c.       Consultants:   Cardiology  Cardiothoracic surgery  Infectious disease  Procedures: TEE scheduled tomorrow   antimicrobials: Vancomycin  Subjective: No pain relief to fentanyl.  No nausea, vomiting or abd pain.  Objective: Vitals:   11/06/19 2043 11/07/19 0107 11/07/19 0430 11/07/19 0745  BP: 119/78 106/74 117/84 127/81  Pulse: (!) 103 94 (!) 103 (!) 103  Resp: Temp: 98.4 F (36.9 C) 98.4 F (36.9 C) 98.4 F (36.9 C) 98.5 F (36.9 C)  TempSrc: Oral Oral Oral Oral  SpO2: 99% 98% 95% 98%  Weight:   49.3 kg     Intake/Output Summary (Last 24 hours) at 11/07/2019 0819 Last data filed at 11/06/2019 2152 Gross per 24 hour  Intake 847 ml  Output 500 ml  Net 347 ml   Filed Weights   11/06/19 0012 11/07/19 0430  Weight: 48.7 kg 49.3 kg    Examination:  General exam: alert and comfortable.  Respiratory system: Clear to auscultation, no wheezing or rhonchi Cardiovascular system: S1-S2 heard, regular rate rhythm Gastrointestinal system: Abdomen is soft, nontender bowel sounds normal Central nervous system: Alert and oriented, grossly nonfocal Extremities: No leg edema. Skin: No ulcers  seen Psychiatry: Mood is appropriate    Data Reviewed: I have personally reviewed following labs and imaging studies  CBC: Recent Labs  Lab 11/03/19 0950 11/04/19 0434 11/05/19 2014 11/06/19 0842 11/07/19 0535  WBC 10.7* 10.3 12.7* 10.7* 12.1*  NEUTROABS 9.3*  --  11.0*  --   --   HGB 12.0 9.3* 9.9* 8.2* 7.7*  HCT 35.6* 27.9* 30.8* 24.5* 23.6*  MCV 79.3* 80.2 81.3 78.8* 79.5*  PLT 173 193 346 305 311    Basic Metabolic Panel: Recent Labs  Lab 11/03/19 0950 11/03/19 0950 11/03/19 2240 11/04/19 0434 11/05/19 2014 11/06/19 0842 11/07/19 0535  NA 128*   123*   < > 131* 129* 131* 131* 133*  K 4.2  4.1   < > 3.8 3.5 3.4* 3.3* 3.8  CL 89*  86*   < > 95* 96* 99 103 106  CO2 22  22   < > 25 24 20* 20* 18*  GLUCOSE 88  92   < > 105* 134* 148* 117* 132*  BUN 39*  39*   < > 30* 22* 8 9 7   CREATININE 1.33*  1.33*   < > 0.77 0.66 0.66 0.55 0.51  CALCIUM 8.2*  8.0*   < > 7.9* 7.5* 8.3* 7.5* 7.4*  MG  --   --   --   --   --  1.8  --   PHOS 4.2  --  3.6 2.6  --   --   --    < > = values in this interval not displayed.    GFR: Estimated Creatinine Clearance: 80.8 mL/min (by C-G formula based on SCr of 0.51 mg/dL).  Liver Function Tests: Recent Labs  Lab 11/03/19 0950 11/03/19 2240 11/04/19 0434 11/04/19 1550 11/05/19 2014  AST 84*  --   --  29 32  ALT 36  --   --  23 22  ALKPHOS 133*  --   --  103 128*  BILITOT 1.0  --   --  0.8 0.4  PROT 7.5  --   --  6.0* 6.9  ALBUMIN 2.2*  2.2* 1.8* 1.5* 1.7* 1.8*    CBG: No results for input(s): GLUCAP in the last 168 hours.   Recent Results (from the past 240 hour(s))  Urine culture     Status: Abnormal   Collection Time: 11/03/19  8:18 AM   Specimen: In/Out Cath Urine  Result Value Ref Range Status   Specimen Description   Final    IN/OUT CATH URINE Performed at [facility], [address], [place], [place]    Special Requests   Final    NONE Performed at [facility], [address], [place], [place]    Culture >=100,000 COLONIES/mL ESCHERICHIA COLI (A)  Final   Report Status 11/05/2019 FINAL  Final   Organism ID, Bacteria ESCHERICHIA COLI (A)  Final      Susceptibility   Escherichia coli - MIC*    AMPICILLIN 4 SENSITIVE Sensitive     CEFAZOLIN <=4 SENSITIVE Sensitive     CEFTRIAXONE <=0.25 SENSITIVE Sensitive     CIPROFLOXACIN <=0.25 SENSITIVE Sensitive     GENTAMICIN <=1 SENSITIVE Sensitive     IMIPENEM <=0.25 SENSITIVE Sensitive     NITROFURANTOIN <=16 SENSITIVE Sensitive     TRIMETH/SULFA <=20 SENSITIVE  Sensitive     AMPICILLIN/SULBACTAM <=2 SENSITIVE Sensitive     PIP/TAZO <=4 SENSITIVE Sensitive     * >=100,000 COLONIES/mL  ESCHERICHIA COLI  Respiratory Panel by RT PCR (Flu A&B, Covid) - Nasopharyngeal Swab     Status: None   Collection Time: 11/03/19  9:13 AM   Specimen: Nasopharyngeal Swab  Result Value Ref Range Status   SARS Coronavirus 2 by RT PCR NEGATIVE NEGATIVE Final    Comment: (NOTE) SARS-CoV-2 target nucleic acids are NOT DETECTED.  The SARS-CoV-2 RNA is generally detectable in upper respiratoy specimens during the acute phase of infection. The lowest concentration of SARS-CoV-2 viral copies this assay can detect is 131 copies/mL. A negative result does not preclude SARS-Cov-2 infection and should not be used as the sole basis for treatment or other patient management decisions. A negative result may occur with  improper specimen collection/handling, submission of specimen other than nasopharyngeal swab, presence of viral mutation(s) within the areas targeted by this assay, and inadequate number of viral copies (<131 copies/mL). A negative result must be combined with clinical observations, patient history, and epidemiological information. The expected result is Negative.  Fact Sheet for Patients:  https://www.moore.com/https://www.fda.gov/media/142436/download  Fact Sheet for Healthcare Providers:  https://www.young.biz/https://www.fda.gov/media/142435/download  This test is no t yet approved or cleared by the [place] FDA and  has been authorized for detection and/or diagnosis of SARS-CoV-2 by FDA under an Emergency Use Authorization (EUA). This EUA will remain  in effect (meaning this test can be used) for the duration of the COVID-19 declaration under Section 564(b)(1) of the Act, 21 U.S.C. section 360bbb-3(b)(1), unless the authorization is terminated or revoked sooner.     Influenza A by PCR NEGATIVE NEGATIVE Final   Influenza B by PCR NEGATIVE NEGATIVE Final    Comment: (NOTE) The Xpert  Xpress SARS-CoV-2/FLU/RSV assay is intended as an aid in  the diagnosis of influenza from Nasopharyngeal swab specimens and  should not be used as a sole basis for treatment. Nasal washings and  aspirates are unacceptable for Xpert Xpress SARS-CoV-2/FLU/RSV  testing.  Fact Sheet for Patients: https://www.moore.com/https://www.fda.gov/media/142436/download  Fact Sheet for Healthcare Providers: https://www.young.biz/https://www.fda.gov/media/142435/download  This test is not yet approved or cleared by the [place] FDA and  has been authorized for detection and/or diagnosis of SARS-CoV-2 by  FDA under an Emergency Use Authorization (EUA). This EUA will remain  in effect (meaning this test can be used) for the duration of the  Covid-19 declaration under Section 564(b)(1) of the Act, 21  U.S.C. section 360bbb-3(b)(1), unless the authorization is  terminated or revoked. Performed at [facility], [address] [place], [place] [address]   Blood Culture (routine x 2)     Status: Abnormal   Collection Time: 11/03/19  9:50 AM   Specimen: BLOOD RIGHT HAND  Result Value Ref Range Status   Specimen Description   Final    BLOOD RIGHT HAND Performed at [facility], [address] [place], [place] [address]    Special Requests   Final    BOTTLES DRAWN AEROBIC ONLY Blood Culture adequate volume Performed at [facility], [address] [place], [place] [address]    Culture  Setup Time   Final    AEROBIC BOTTLE ONLY GRAM POSITIVE COCCI CRITICAL VALUE NOTED.  VALUE IS CONSISTENT WITH PREVIOUSLY REPORTED AND CALLED VALUE.    Culture (A)  Final    STAPHYLOCOCCUS AUREUS SUSCEPTIBILITIES PERFORMED ON PREVIOUS CULTURE WITHIN THE LAST 5 DAYS. Performed at [facility], [address] [place], [place] [address]    Report Status 11/05/2019 FINAL  Final  Blood Culture (  routine x 2)     Status: Abnormal   Collection Time: 11/03/19  9:50 AM   Specimen: BLOOD  Result  Value Ref Range Status   Specimen Description   Final    BLOOD LEFT ARM Performed at [facility] [address] [place], [place] [address]    Special Requests   Final    BOTTLES DRAWN AEROBIC AND ANAEROBIC Blood Culture adequate volume Performed at [facility], [address], [place], [place] [address]    Culture  Setup Time   Final    IN BOTH AEROBIC AND ANAEROBIC BOTTLES GRAM POSITIVE COCCI CRITICAL VALUE NOTED.  VALUE IS CONSISTENT WITH PREVIOUSLY REPORTED AND CALLED VALUE.    Culture (A)  Final    STAPHYLOCOCCUS AUREUS SUSCEPTIBILITIES PERFORMED ON PREVIOUS CULTURE WITHIN THE LAST 5 DAYS. Performed at [facility], [address], [place], [place] [address]    Report Status 11/05/2019 FINAL  Final  Culture, blood (Routine X 2) w Reflex to ID Panel     Status: Abnormal   Collection Time: 11/03/19  9:52 AM   Specimen: BLOOD  Result Value Ref Range Status   Specimen Description   Final    BLOOD RIGHT ARM Performed at [facility], [address] [place], [place] [address]    Special Requests   Final    BOTTLES DRAWN AEROBIC ONLY Blood Culture adequate volume Performed at [facility], [address], [place], [place] [address]    Culture  Setup Time   Final    AEROBIC BOTTLE ONLY GRAM POSITIVE COCCI Organism ID to follow CRITICAL RESULT CALLED TO, READ BACK BY AND VERIFIED WITH:  PHARMD E JACKSON 11/03/19 AT 1157 SK Performed at [facility], [address] [place], [place] [address]    Culture METHICILLIN RESISTANT STAPHYLOCOCCUS AUREUS (A)  Final   Report Status 11/05/2019 FINAL  Final   Organism ID, Bacteria METHICILLIN RESISTANT STAPHYLOCOCCUS AUREUS  Final      Susceptibility   Methicillin resistant staphylococcus aureus - MIC*    CIPROFLOXACIN >=8 RESISTANT Resistant     ERYTHROMYCIN >=8 RESISTANT Resistant     GENTAMICIN <=0.5 SENSITIVE Sensitive     OXACILLIN >=4  RESISTANT Resistant     TETRACYCLINE <=1 SENSITIVE Sensitive     VANCOMYCIN <=0.5 SENSITIVE Sensitive     TRIMETH/SULFA 160 RESISTANT Resistant     CLINDAMYCIN >=8 RESISTANT Resistant     RIFAMPIN <=0.5 SENSITIVE Sensitive     Inducible Clindamycin NEGATIVE Sensitive     * METHICILLIN RESISTANT STAPHYLOCOCCUS AUREUS  Blood Culture ID Panel (Reflexed)     Status: Abnormal   Collection Time: 11/03/19  9:52 AM  Result Value Ref Range Status   Enterococcus faecalis NOT DETECTED NOT DETECTED Final   Enterococcus Faecium NOT DETECTED NOT DETECTED Final   Listeria monocytogenes NOT DETECTED NOT DETECTED Final   Staphylococcus species DETECTED (A) NOT DETECTED Final    Comment: CRITICAL RESULT CALLED TO, READ BACK BY AND VERIFIED WITH: PHARMD E JACKSON 11/03/19 AT 2357 SK    Staphylococcus aureus (BCID) DETECTED (A) NOT DETECTED Final    Comment: Methicillin (oxacillin)-resistant Staphylococcus aureus (MRSA). MRSA is predictably resistant to beta-lactam antibiotics (except ceftaroline). Preferred therapy is vancomycin unless clinically contraindicated. Patient requires contact precautions if  hospitalized. CRITICAL RESULT CALLED TO, READ BACK BY AND VERIFIED WITH: PHARMD E JACKSON 11/03/19 AT 2357 SK    Staphylococcus epidermidis NOT DETECTED NOT DETECTED Final  Staphylococcus lugdunensis NOT DETECTED NOT DETECTED Final   Streptococcus species NOT DETECTED NOT DETECTED Final   Streptococcus agalactiae NOT DETECTED NOT DETECTED Final   Streptococcus pneumoniae NOT DETECTED NOT DETECTED Final   Streptococcus pyogenes NOT DETECTED NOT DETECTED Final   A.calcoaceticus-baumannii NOT DETECTED NOT DETECTED Final   Bacteroides fragilis NOT DETECTED NOT DETECTED Final   Enterobacterales NOT DETECTED NOT DETECTED Final   Enterobacter cloacae complex NOT DETECTED NOT DETECTED Final   Escherichia coli NOT DETECTED NOT DETECTED Final   Klebsiella aerogenes NOT DETECTED NOT DETECTED Final   Klebsiella  oxytoca NOT DETECTED NOT DETECTED Final   Klebsiella pneumoniae NOT DETECTED NOT DETECTED Final   Proteus species NOT DETECTED NOT DETECTED Final   Salmonella species NOT DETECTED NOT DETECTED Final   Serratia marcescens NOT DETECTED NOT DETECTED Final   Haemophilus influenzae NOT DETECTED NOT DETECTED Final   Neisseria meningitidis NOT DETECTED NOT DETECTED Final   Pseudomonas aeruginosa NOT DETECTED NOT DETECTED Final   Stenotrophomonas maltophilia NOT DETECTED NOT DETECTED Final   Candida albicans NOT DETECTED NOT DETECTED Final   Candida auris NOT DETECTED NOT DETECTED Final   Candida glabrata NOT DETECTED NOT DETECTED Final   Candida krusei NOT DETECTED NOT DETECTED Final   Candida parapsilosis NOT DETECTED NOT DETECTED Final   Candida tropicalis NOT DETECTED NOT DETECTED Final   Cryptococcus neoformans/gattii NOT DETECTED NOT DETECTED Final   Meth resistant mecA/C and MREJ DETECTED (A) NOT DETECTED Final    Comment: CRITICAL RESULT CALLED TO, READ BACK BY AND VERIFIED WITH: PHARMD E JACKSON 11/03/19 AT 2357 SK Performed at [facility], [address], [place], [place] [address]   Culture, blood (Routine X 2) w Reflex to ID Panel     Status: Abnormal (Preliminary result)   Collection Time: 11/05/19  8:29 AM   Specimen: BLOOD  Result Value Ref Range Status   Specimen Description BLOOD RIGHT ANTECUBITAL  Final   Special Requests   Final    BOTTLES DRAWN AEROBIC ONLY Blood Culture adequate volume   Culture  Setup Time   Final    GRAM POSITIVE COCCI IN CLUSTERS AEROBIC BOTTLE ONLY Organism ID to follow CRITICAL RESULT CALLED TO, READ BACK BY AND VERIFIED WITH: J. MILLEN PHARMD, AT 1124 11/06/19 Performed at [facility], [address], [place], [place] [address]    Culture STAPHYLOCOCCUS AUREUS (A)  Final   Report Status PENDING  Incomplete  Culture, blood (Routine X 2) w Reflex to ID Panel     Status: Abnormal (Preliminary result)   Collection Time: 11/05/19   8:29 AM   Specimen: BLOOD RIGHT HAND  Result Value Ref Range Status   Specimen Description BLOOD RIGHT HAND  Final   Special Requests   Final    BOTTLES DRAWN AEROBIC ONLY Blood Culture adequate volume   Culture  Setup Time   Final    GRAM POSITIVE COCCI IN CLUSTERS AEROBIC BOTTLE ONLY CRITICAL VALUE NOTED.  VALUE IS CONSISTENT WITH PREVIOUSLY REPORTED AND CALLED VALUE. Performed at [facility], [address] [place], [place] [address]    Culture STAPHYLOCOCCUS AUREUS (A)  Final   Report Status PENDING  Incomplete  Blood Culture ID Panel (Reflexed)     Status: Abnormal   Collection Time: 11/05/19  8:29 AM  Result Value Ref Range Status   Enterococcus faecalis NOT DETECTED NOT DETECTED Final   Enterococcus Faecium NOT DETECTED NOT DETECTED Final   Listeria monocytogenes NOT DETECTED  NOT DETECTED Final   Staphylococcus species DETECTED (A) NOT DETECTED Final    Comment: CRITICAL RESULT CALLED TO, READ BACK BY AND VERIFIED WITH: J. MILLEN PHARMD, AT 1124 11/06/19 BY D. VANHOOK    Staphylococcus aureus (BCID) DETECTED (A) NOT DETECTED Final    Comment: Methicillin (oxacillin)-resistant Staphylococcus aureus (MRSA). MRSA is predictably resistant to beta-lactam antibiotics (except ceftaroline). Preferred therapy is vancomycin unless clinically contraindicated. Patient requires contact precautions if  hospitalized. CRITICAL RESULT CALLED TO, READ BACK BY AND VERIFIED WITH: J. MILLEN PHARMD, AT 1124 11/06/19 BY D. VANHOOK    Staphylococcus epidermidis NOT DETECTED NOT DETECTED Final   Staphylococcus lugdunensis NOT DETECTED NOT DETECTED Final   Streptococcus species NOT DETECTED NOT DETECTED Final   Streptococcus agalactiae NOT DETECTED NOT DETECTED Final   Streptococcus pneumoniae NOT DETECTED NOT DETECTED Final   Streptococcus pyogenes NOT DETECTED NOT DETECTED Final   A.calcoaceticus-baumannii NOT DETECTED NOT DETECTED Final   Bacteroides fragilis NOT DETECTED NOT DETECTED  Final   Enterobacterales NOT DETECTED NOT DETECTED Final   Enterobacter cloacae complex NOT DETECTED NOT DETECTED Final   Escherichia coli NOT DETECTED NOT DETECTED Final   Klebsiella aerogenes NOT DETECTED NOT DETECTED Final   Klebsiella oxytoca NOT DETECTED NOT DETECTED Final   Klebsiella pneumoniae NOT DETECTED NOT DETECTED Final   Proteus species NOT DETECTED NOT DETECTED Final   Salmonella species NOT DETECTED NOT DETECTED Final   Serratia marcescens NOT DETECTED NOT DETECTED Final   Haemophilus influenzae NOT DETECTED NOT DETECTED Final   Neisseria meningitidis NOT DETECTED NOT DETECTED Final   Pseudomonas aeruginosa NOT DETECTED NOT DETECTED Final   Stenotrophomonas maltophilia NOT DETECTED NOT DETECTED Final   Candida albicans NOT DETECTED NOT DETECTED Final   Candida auris NOT DETECTED NOT DETECTED Final   Candida glabrata NOT DETECTED NOT DETECTED Final   Candida krusei NOT DETECTED NOT DETECTED Final   Candida parapsilosis NOT DETECTED NOT DETECTED Final   Candida tropicalis NOT DETECTED NOT DETECTED Final   Cryptococcus neoformans/gattii NOT DETECTED NOT DETECTED Final   Meth resistant mecA/C and MREJ DETECTED (A) NOT DETECTED Final   Carbapenem resist OXA 48 LIKE NOT DETECTED NOT DETECTED Final   Carbapenem resistance VIM NOT DETECTED NOT DETECTED Final    Comment: Performed at [facility], [address] [place], [place] [address]  Blood culture (routine x 2)     Status: Abnormal (Preliminary result)   Collection Time: 11/05/19  9:47 PM   Specimen: BLOOD RIGHT ARM  Result Value Ref Range Status   Specimen Description BLOOD RIGHT ARM  Final   Special Requests   Final    BOTTLES DRAWN AEROBIC AND ANAEROBIC Blood Culture adequate volume   Culture  Setup Time   Final    GRAM POSITIVE COCCI IN CLUSTERS IN BOTH AEROBIC AND ANAEROBIC BOTTLES CRITICAL VALUE NOTED.  VALUE IS CONSISTENT WITH PREVIOUSLY REPORTED AND CALLED VALUE.    Culture (A)  Final     STAPHYLOCOCCUS AUREUS SUSCEPTIBILITIES TO FOLLOW Performed at [facility], [address], [place], [place] [address]    Report Status PENDING  Incomplete  Blood culture (routine x 2)     Status: Abnormal (Preliminary result)   Collection Time: 11/05/19 10:36 PM   Specimen: BLOOD  Result Value Ref Range Status   Specimen Description BLOOD SITE NOT SPECIFIED  Final   Special Requests   Final    BOTTLES DRAWN AEROBIC AND ANAEROBIC Blood Culture adequate volume   Culture  Setup Time   Final    GRAM POSITIVE COCCI IN CLUSTERS IN BOTH AEROBIC AND ANAEROBIC BOTTLES CRITICAL VALUE NOTED.  VALUE IS CONSISTENT WITH PREVIOUSLY REPORTED AND CALLED VALUE. Performed at [facility], [address] [place], [place] [address]    Culture STAPHYLOCOCCUS AUREUS (A)  Final   Report Status PENDING  Incomplete         Radiology Studies: DG Chest 2 View  Result Date: 11/05/2019 CLINICAL DATA:  Back pain EXAM: CHEST - 2 VIEW COMPARISON:  11/03/2019 FINDINGS: Cardiac shadow is stable. Scattered nodules are identified throughout both lungs similar to that seen on prior CT examination is suspicious for septic emboli. No new pleural effusion is seen. No bony abnormality is noted. IMPRESSION: Scattered nodules consistent with septic emboli similar to that seen on prior CT examination. No new focal abnormality is seen. Electronically Signed   By: Alcide Clever M.D.   On: 11/05/2019 21:35        Scheduled Meds: . nicotine  21 mg Transdermal Daily  . sodium chloride flush  3 mL Intravenous Q12H   Continuous Infusions: . sodium chloride 75 mL/hr at 11/06/19 1836  . sodium chloride    . heparin 1,200 Units/hr (11/06/19 1924)  . vancomycin 1,000 mg (11/07/19 0356)     LOS: 2 days        Kathlen Mody, MD [facility]   To contact the attending provider between 7A-7P or the covering provider during after hours 7P-7A, please log into the web site [email or web address] and access  using universal [place] password for that web site. If you do not have the password, please call the hospital operator.  11/07/2019, 8:19 AM

## 2019-11-07 NOTE — Transfer of Care (Signed)
Immediate Anesthesia Transfer of Care Note  Patient: Amber [name]  Procedure(s) Performed: TRANSESOPHAGEAL ECHOCARDIOGRAM (TEE) (N/A )  Patient Location: Endoscopy Unit  Anesthesia Type:MAC  Level of Consciousness: awake, alert  and oriented  Airway & Oxygen Therapy: Patient Spontanous Breathing and Patient connected to nasal cannula oxygen  Post-op Assessment: Report given to RN, Post -op Vital signs reviewed and stable and Patient moving all extremities  Post vital signs: Reviewed and stable  Last Vitals:  Vitals Value Taken Time  BP    Temp    Pulse 80 11/07/19 1331  Resp 23 11/07/19 1331  SpO2 99 % 11/07/19 1331  Vitals shown include unvalidated device data.  Last Pain:  Vitals:   11/07/19 1325  TempSrc: Temporal  PainSc:       Patients Stated Pain Goal: 3 (12/75/17 0017)  Complications: No complications documented.

## 2019-11-07 NOTE — Anesthesia Preprocedure Evaluation (Addendum)
Anesthesia Evaluation  Patient identified by MRN, date of birth, ID band Patient awake    Reviewed: Allergy & Precautions, NPO status , Patient's Chart, lab work & pertinent test results  Airway Mallampati: II  TM Distance: >3 FB Neck ROM: Full    Dental  (+) Teeth Intact, Dental Advisory Given   Pulmonary former smoker,    breath sounds clear to auscultation       Cardiovascular negative cardio ROS   Rhythm:Regular Rate:Normal     Neuro/Psych PSYCHIATRIC DISORDERS Depression    GI/Hepatic negative GI ROS, Neg liver ROS,   Endo/Other  negative endocrine ROS  Renal/GU      Musculoskeletal negative musculoskeletal ROS (+)   Abdominal Normal abdominal exam  (+)   Peds  Hematology negative hematology ROS (+)   Anesthesia Other Findings   Reproductive/Obstetrics                            Anesthesia Physical Anesthesia Plan  ASA: III  Anesthesia Plan: MAC   Post-op Pain Management:    Induction: Intravenous  PONV Risk Score and Plan: 1 and Propofol infusion  Airway Management Planned: Natural Airway and Simple Face Mask  Additional Equipment: None  Intra-op Plan:   Post-operative Plan:   Informed Consent: I have reviewed the patients History and Physical, chart, labs and discussed the procedure including the risks, benefits and alternatives for the proposed anesthesia with the patient or authorized representative who has indicated his/her understanding and acceptance.       Plan Discussed with: CRNA  Anesthesia Plan Comments: (Echo:  1. Left ventricular ejection fraction, by estimation, is 60 to 65%. The  left ventricle has normal function. The left ventricle has no regional  wall motion abnormalities. Left ventricular diastolic parameters were  normal.  2. Right ventricular systolic function There is an ill defined mass  encompassing a large portion of the RV cavity.  Recommend cardiac MRI for  further evaulation.. The right ventricular size is normal. There is normal  pulmonary artery systolic pressure.  The estimated right ventricular systolic pressure is 50.9 mmHg.  3. The mitral valve is normal in structure. No evidence of mitral valve  regurgitation. No evidence of mitral stenosis.  4. Possible vegetation on TV leaflet. Consider TEE for further  evaulation. . The tricuspid valve is abnormal.  5. The aortic valve was not well visualized. Aortic valve regurgitation  is not visualized. No aortic stenosis is present.  6. The inferior vena cava is normal in size with greater than 50%  respiratory variability, suggesting right atrial pressure of 3  mmHg.Recommend Cardiac MRI to evaluated abnormality in RV cavity. ALso  ecoh concerning for TV endocardidits. )       Anesthesia Quick Evaluation

## 2019-11-07 NOTE — Progress Notes (Signed)
Progress Note  Patient Name: CHAILYN RACETTE Date of Encounter: 11/07/2019  [facility] Cardiologist: No primary care provider on file.   Subjective   Resting comfortably, no chest pain  Inpatient Medications    Scheduled Meds: . nicotine  21 mg Transdermal Daily  . sodium chloride flush  3 mL Intravenous Q12H   Continuous Infusions: . sodium chloride 75 mL/hr at 11/07/19 0907  . sodium chloride    . heparin 1,200 Units/hr (11/06/19 1924)  . vancomycin 1,000 mg (11/07/19 0356)   PRN Meds: acetaminophen **OR** acetaminophen, alum & mag hydroxide-simeth, fentaNYL (SUBLIMAZE) injection, hydrOXYzine, ketorolac, ondansetron **OR** ondansetron (ZOFRAN) IV   Vital Signs    Vitals:   11/06/19 2043 11/07/19 0107 11/07/19 0430 11/07/19 0745  BP: 119/78 106/74 117/84 127/81  Pulse: (!) 103 94 (!) 103 (!) 103  Resp: 20 20 20 20   Temp: 98.4 F (36.9 C) 98.4 F (36.9 C) 98.4 F (36.9 C) 98.5 F (36.9 C)  TempSrc: Oral Oral Oral Oral  SpO2: 99% 98% 95% 98%  Weight:   49.3 kg     Intake/Output Summary (Last 24 hours) at 11/07/2019 0915 Last data filed at 11/07/2019 0839 Gross per 24 hour  Intake 847 ml  Output 500 ml  Net 347 ml   Last 3 Weights 11/07/2019 11/06/2019 11/05/2019  Weight (lbs) 108 lb 11 oz 107 lb 5.8 oz 111 lb 1.8 oz  Weight (kg) 49.3 kg 48.7 kg 50.4 kg      Telemetry    Sinus rhythm/sinus tachycardia- Personally Reviewed  ECG    No new- Personally Reviewed  Physical Exam   GEN:  Ill-appearing in bed, pale Neck: No JVD Cardiac: RRR, no murmurs, rubs, or gallops.  Respiratory: Clear to auscultation bilaterally. GI: Soft, nontender, non-distended  MS: No edema; No deformity. Neuro:  Nonfocal  Psych: Somber affect   Labs    High Sensitivity Troponin:   Recent Labs  Lab 11/03/19 2240  TROPONINIHS 14      Chemistry Recent Labs  Lab 11/03/19 0950 11/03/19 2240 11/04/19 0434 11/04/19 0434 11/04/19 1550 11/05/19 2014  11/06/19 0842 11/07/19 0535  NA 128*  123*   < > 129*   < >  --  131* 131* 133*  K 4.2  4.1   < > 3.5   < >  --  3.4* 3.3* 3.8  CL 89*  86*   < > 96*   < >  --  99 103 106  CO2 22  22   < > 24   < >  --  20* 20* 18*  GLUCOSE 88  92   < > 134*   < >  --  148* 117* 132*  BUN 39*  39*   < > 22*   < >  --  8 9 7   CREATININE 1.33*  1.33*   < > 0.66   < >  --  0.66 0.55 0.51  CALCIUM 8.2*  8.0*   < > 7.5*   < >  --  8.3* 7.5* 7.4*  PROT 7.5  --   --   --  6.0* 6.9  --   --   ALBUMIN 2.2*  2.2*   < > 1.5*  --  1.7* 1.8*  --   --   AST 84*  --   --   --  29 32  --   --   ALT 36  --   --   --  23  22  --   --   ALKPHOS 133*  --   --   --  103 128*  --   --   BILITOT 1.0  --   --   --  0.8 0.4  --   --   GFRNONAA 56*  56*   < > >60   < >  --  >60 >60 >60  ANIONGAP 17*  15   < > 9   < >  --  12 8 9    < > = values in this interval not displayed.     Hematology Recent Labs  Lab 11/05/19 2014 11/06/19 0842 11/07/19 0535  WBC 12.7* 10.7* 12.1*  RBC 3.79* 3.11* 2.97*  HGB 9.9* 8.2* 7.7*  HCT 30.8* 24.5* 23.6*  MCV 81.3 78.8* 79.5*  MCH 26.1 26.4 25.9*  MCHC 32.1 33.5 32.6  RDW 14.6 14.3 14.2  PLT 346 305 311    BNPNo results for input(s): BNP, PROBNP in the last 168 hours.   DDimer No results for input(s): DDIMER in the last 168 hours.   Radiology    DG Chest 2 View  Result Date: 11/05/2019 CLINICAL DATA:  Back pain EXAM: CHEST - 2 VIEW COMPARISON:  11/03/2019 FINDINGS: Cardiac shadow is stable. Scattered nodules are identified throughout both lungs similar to that seen on prior CT examination is suspicious for septic emboli. No new pleural effusion is seen. No bony abnormality is noted. IMPRESSION: Scattered nodules consistent with septic emboli similar to that seen on prior CT examination. No new focal abnormality is seen. Electronically Signed   By: 11/05/2019 M.D.   On: 11/05/2019 21:35    Cardiac Studies   TEE pending  Patient Profile     29 y.o. female  endocarditis IV drug use, globular mass associated with right ventricle.  Assessment & Plan    Endocarditis/right ventricular globular mass -TEE on schedule today for 1 PM. NPO. Further evaluation. If TEE is inconclusive, consider cardiac MRI or cardiac CT for further textural details. Septic emboli noted to lungs. Right ventricle looked generous on echocardiogram personally reviewed. Mild anemia noted. No source of bleeding. Should be okay for TEE.  Discussed with mother in room. -IV antibiotics per infectious disease      For questions or updates, please contact [facility] Please consult [email or web address] for contact info under        Signed, 37, MD  11/07/2019, 9:15 AM

## 2019-11-07 NOTE — Anesthesia Postprocedure Evaluation (Signed)
Anesthesia Post Note  Patient: Amber [name]  Procedure(s) Performed: TRANSESOPHAGEAL ECHOCARDIOGRAM (TEE) (N/A )     Patient location during evaluation: Endoscopy Anesthesia Type: MAC Level of consciousness: awake and alert Pain management: pain level controlled Vital Signs Assessment: post-procedure vital signs reviewed and stable Respiratory status: spontaneous breathing, nonlabored ventilation and respiratory function stable Cardiovascular status: blood pressure returned to baseline and stable Postop Assessment: no apparent nausea or vomiting Anesthetic complications: no   No complications documented.  Last Vitals:  Vitals:   11/07/19 1350 11/07/19 1400  BP: 106/73 113/76  Pulse: 72 77  Resp: (!) 32 (!) 26  Temp:    SpO2: 100% 97%    Last Pain:  Vitals:   11/07/19 1415  TempSrc:   PainSc: 10-Worst pain ever                 Lidia Collum

## 2019-11-07 NOTE — H&P (View-Only) (Signed)
Progress Note  Patient Name: CHAILYN RACETTE Date of Encounter: 11/07/2019  [facility] Cardiologist: No primary care provider on file.   Subjective   Resting comfortably, no chest pain  Inpatient Medications    Scheduled Meds: . nicotine  21 mg Transdermal Daily  . sodium chloride flush  3 mL Intravenous Q12H   Continuous Infusions: . sodium chloride 75 mL/hr at 11/07/19 0907  . sodium chloride    . heparin 1,200 Units/hr (11/06/19 1924)  . vancomycin 1,000 mg (11/07/19 0356)   PRN Meds: acetaminophen **OR** acetaminophen, alum & mag hydroxide-simeth, fentaNYL (SUBLIMAZE) injection, hydrOXYzine, ketorolac, ondansetron **OR** ondansetron (ZOFRAN) IV   Vital Signs    Vitals:   11/06/19 2043 11/07/19 0107 11/07/19 0430 11/07/19 0745  BP: 119/78 106/74 117/84 127/81  Pulse: (!) 103 94 (!) 103 (!) 103  Resp: 20 20 20 20   Temp: 98.4 F (36.9 C) 98.4 F (36.9 C) 98.4 F (36.9 C) 98.5 F (36.9 C)  TempSrc: Oral Oral Oral Oral  SpO2: 99% 98% 95% 98%  Weight:   49.3 kg     Intake/Output Summary (Last 24 hours) at 11/07/2019 0915 Last data filed at 11/07/2019 0839 Gross per 24 hour  Intake 847 ml  Output 500 ml  Net 347 ml   Last 3 Weights 11/07/2019 11/06/2019 11/05/2019  Weight (lbs) 108 lb 11 oz 107 lb 5.8 oz 111 lb 1.8 oz  Weight (kg) 49.3 kg 48.7 kg 50.4 kg      Telemetry    Sinus rhythm/sinus tachycardia- Personally Reviewed  ECG    No new- Personally Reviewed  Physical Exam   GEN:  Ill-appearing in bed, pale Neck: No JVD Cardiac: RRR, no murmurs, rubs, or gallops.  Respiratory: Clear to auscultation bilaterally. GI: Soft, nontender, non-distended  MS: No edema; No deformity. Neuro:  Nonfocal  Psych: Somber affect   Labs    High Sensitivity Troponin:   Recent Labs  Lab 11/03/19 2240  TROPONINIHS 14      Chemistry Recent Labs  Lab 11/03/19 0950 11/03/19 2240 11/04/19 0434 11/04/19 0434 11/04/19 1550 11/05/19 2014  11/06/19 0842 11/07/19 0535  NA 128*  123*   < > 129*   < >  --  131* 131* 133*  K 4.2  4.1   < > 3.5   < >  --  3.4* 3.3* 3.8  CL 89*  86*   < > 96*   < >  --  99 103 106  CO2 22  22   < > 24   < >  --  20* 20* 18*  GLUCOSE 88  92   < > 134*   < >  --  148* 117* 132*  BUN 39*  39*   < > 22*   < >  --  8 9 7   CREATININE 1.33*  1.33*   < > 0.66   < >  --  0.66 0.55 0.51  CALCIUM 8.2*  8.0*   < > 7.5*   < >  --  8.3* 7.5* 7.4*  PROT 7.5  --   --   --  6.0* 6.9  --   --   ALBUMIN 2.2*  2.2*   < > 1.5*  --  1.7* 1.8*  --   --   AST 84*  --   --   --  29 32  --   --   ALT 36  --   --   --  23  22  --   --   ALKPHOS 133*  --   --   --  103 128*  --   --   BILITOT 1.0  --   --   --  0.8 0.4  --   --   GFRNONAA 56*  56*   < > >60   < >  --  >60 >60 >60  ANIONGAP 17*  15   < > 9   < >  --  12 8 9   < > = values in this interval not displayed.     Hematology Recent Labs  Lab 11/05/19 2014 11/06/19 0842 11/07/19 0535  WBC 12.7* 10.7* 12.1*  RBC 3.79* 3.11* 2.97*  HGB 9.9* 8.2* 7.7*  HCT 30.8* 24.5* 23.6*  MCV 81.3 78.8* 79.5*  MCH 26.1 26.4 25.9*  MCHC 32.1 33.5 32.6  RDW 14.6 14.3 14.2  PLT 346 305 311    BNPNo results for input(s): BNP, PROBNP in the last 168 hours.   DDimer No results for input(s): DDIMER in the last 168 hours.   Radiology    DG Chest 2 View  Result Date: 11/05/2019 CLINICAL DATA:  Back pain EXAM: CHEST - 2 VIEW COMPARISON:  11/03/2019 FINDINGS: Cardiac shadow is stable. Scattered nodules are identified throughout both lungs similar to that seen on prior CT examination is suspicious for septic emboli. No new pleural effusion is seen. No bony abnormality is noted. IMPRESSION: Scattered nodules consistent with septic emboli similar to that seen on prior CT examination. No new focal abnormality is seen. Electronically Signed   By: Rainna Nearhood  Lukens M.D.   On: 11/05/2019 21:35    Cardiac Studies   TEE pending  Patient Profile     29 y.o. female  endocarditis IV drug use, globular mass associated with right ventricle.  Assessment & Plan    Endocarditis/right ventricular globular mass -TEE on schedule today for 1 PM. NPO. Further evaluation. If TEE is inconclusive, consider cardiac MRI or cardiac CT for further textural details. Septic emboli noted to lungs. Right ventricle looked generous on echocardiogram personally reviewed. Mild anemia noted. No source of bleeding. Should be okay for TEE.  Discussed with mother in room. -IV antibiotics per infectious disease      For questions or updates, please contact [facility] Please consult [email or web address] for contact info under        Signed, Antonie Borjon, MD  11/07/2019, 9:15 AM    

## 2019-11-07 NOTE — Interval H&P Note (Signed)
History and Physical Interval Note:  11/07/2019 12:25 PM  Amber [name]  has presented today for surgery, with the diagnosis of bacteremia.  The various methods of treatment have been discussed with the patient and family. After consideration of risks, benefits and other options for treatment, the patient has consented to  Procedure(s): TRANSESOPHAGEAL ECHOCARDIOGRAM (TEE) (N/A) as a surgical intervention.  The patient's history has been reviewed, patient examined, no change in status, stable for surgery.  I have reviewed the patient's chart and labs.  Questions were answered to the patient's satisfaction.     [name]

## 2019-11-08 ENCOUNTER — Encounter (HOSPITAL_COMMUNITY): Payer: Self-pay | Admitting: Cardiovascular Disease

## 2019-11-08 LAB — CULTURE, BLOOD (ROUTINE X 2)
Special Requests: ADEQUATE
Special Requests: ADEQUATE
Special Requests: ADEQUATE
Special Requests: ADEQUATE

## 2019-11-08 NOTE — Discharge Summary (Signed)
Physician Discharge Summary  JULIE-ANN VANMAANEN ZOX:[id number] DOB: 1990/06/04 DOA: 11/05/2019  PCP: Patient, No Pcp Per  Admit date: 11/05/2019 Discharge date: 11/08/2019  Patient left AMA.   Discharge Diagnoses:  Principal Problem:   Bacterial endocarditis Active Problems:   IV drug abuse (HCC)   Sepsis (HCC)   MRSA bacteremia   Septic embolism (HCC)   Hyponatremia   Right ventricular mass  29 year old lady with prior history of MRSA bacteremia, endocarditis, septic emboli, right ventricular mass presents to ED last night after leaving AMA yesterday morning.  Patient has a history of IVDU initially came in for headache, fever tachycardia was found to have MRSA bacteremia.  CT chest showed septic emboli with left upper lobe cavitary nodule and small bilateral effusions.  Echocardiogram done showed ill-defined mass in the right ventricular cavity and possible vegetation on the tricuspid valve.  Cardiology, cardiothoracic surgery and infectious disease are on board. Patient was restarted on vancomycin and IV heparin and she is scheduled for TEE . She underwent TEE and was seen by cardio thoracic surgery Dr Laneta Simmers .  Notified by RN that patient is leaving AMA. When I called back, she has already left AMA. This is the second time she left AMA in this week.   Discharge Instructions   Allergies as of 11/07/2019   No Known Allergies     Medication List    ASK your doctor about these medications   acetaminophen 500 MG tablet Commonly known as: TYLENOL Take 1,000 mg by mouth every 6 (six) hours as needed.   ibuprofen 600 MG tablet Commonly known as: ADVIL Take 1 tablet (600 mg total) by mouth every 6 (six) hours as needed.   ondansetron 4 MG disintegrating tablet Commonly known as: Zofran ODT Take 1 tablet (4 mg total) by mouth every 8 (eight) hours as needed for nausea or vomiting.       No Known Allergies  Consultations:  Cardiothoracic surgery  ID  Cardiology.     Procedures/Studies: DG Chest 2 View  Result Date: 11/05/2019 CLINICAL DATA:  Back pain EXAM: CHEST - 2 VIEW COMPARISON:  11/03/2019 FINDINGS: Cardiac shadow is stable. Scattered nodules are identified throughout both lungs similar to that seen on prior CT examination is suspicious for septic emboli. No new pleural effusion is seen. No bony abnormality is noted. IMPRESSION: Scattered nodules consistent with septic emboli similar to that seen on prior CT examination. No new focal abnormality is seen. Electronically Signed   By: Alcide Clever M.D.   On: 11/05/2019 21:35   CT Angio Chest PE W and/or Wo Contrast  Result Date: 11/03/2019 CLINICAL DATA:  Hypotensive, heroin user EXAM: CT ANGIOGRAPHY CHEST WITH CONTRAST TECHNIQUE: Multidetector CT imaging of the chest was performed using the standard protocol during bolus administration of intravenous contrast. Multiplanar CT image reconstructions and MIPs were obtained to evaluate the vascular anatomy. CONTRAST:  OMNIPAQUE IOHEXOL 350 MG/ML SOLN COMPARISON:  None. FINDINGS: Cardiovascular: Satisfactory opacification of the pulmonary arteries to the segmental level. No evidence of pulmonary embolism. Normal heart size. No pericardial effusion. Thoracic aorta is normal in caliber. Mediastinum/Nodes: Mildly enlarged mediastinal and hilar lymph nodes are likely reactive. Thyroid and esophagus are unremarkable. Lungs/Pleura: Numerous nodules are present bilaterally with some demonstrating internal cavitation. Mild interlobular septal thickening. Mild bibasilar atelectasis. No pleural effusion or pneumothorax. Upper Abdomen: No acute abnormality. Musculoskeletal: Osseous structures are unremarkable. Review of the MIP images confirms the above findings. IMPRESSION: No acute pulmonary embolism. Pulmonary findings most consistent  with septic emboli. Minor interstitial pulmonary edema. These results were called by telephone at the time of interpretation on  11/03/2019 at 11:56 am to provider ADAM CURATOLO , who verbally acknowledged these results. Electronically Signed   By: Guadlupe Spanish M.D.   On: 11/03/2019 12:01   MR BRAIN WO CONTRAST  Result Date: 11/04/2019 CLINICAL DATA:  Bacteremia.  Rule out septic emboli. EXAM: MRI HEAD WITHOUT CONTRAST TECHNIQUE: Multiplanar, multiecho pulse sequences of the brain and surrounding structures were obtained without intravenous contrast. COMPARISON:  Head CT January 18, 2018. FINDINGS: Brain: No acute infarction, hemorrhage, hydrocephalus, extra-axial collection or mass lesion. The brain parenchyma has normal morphology and signal characteristics. Vascular: Normal flow voids. Skull and upper cervical spine: Diffuse decrease of the T1 signal within the visualized upper cervical spine, calvarium and mandible may represent red marrow reconversion. Correlate with CBC. Sinuses/Orbits: Minimal mucosal thickening of the right maxillary sinus. The orbits are maintained. Other: None. IMPRESSION: 1. No acute intracranial abnormality. Unremarkable MRI of the brain. 2. Diffuse decrease of the T1 signal within the visualized upper cervical spine, calvarium and mandible may represent red marrow reconversion. Correlate with CBC. Electronically Signed   By: Baldemar Lenis M.D.   On: 11/04/2019 20:52   MR THORACIC SPINE WO CONTRAST  Result Date: 11/04/2019 CLINICAL DATA:  Bacteremia.  Back pain. EXAM: MRI THORACIC AND LUMBAR SPINE WITHOUT CONTRAST TECHNIQUE: Multiplanar and multiecho pulse sequences of the thoracic and lumbar spine were obtained without intravenous contrast. The patient refused contrast administration. COMPARISON:  Chest CT yesterday. FINDINGS: MRI THORACIC SPINE FINDINGS Alignment:  Normal Vertebrae: Normal Cord:  Normal Paraspinal and other soft tissues: Small effusions layering dependently, right more than left, with dependent atelectasis in the lower lobes right more than left. Patchy infiltrates as  were shown by CT. Disc levels: No degenerative disc disease. No stenosis of the canal or foramina. No evidence of discitis osteomyelitis or infected facet joint. MRI LUMBAR SPINE FINDINGS Segmentation:  5 lumbar type vertebral bodies. Alignment:  Normal Vertebrae:  Vertebral bodies are normal. Conus medullaris: Extends to the L1-2 level and appears normal. Paraspinal and other soft tissues: Negative Disc levels: No evidence of degenerative disc disease or disc space infection. Patient has some edema associated with the facet joints at L4-5, without frank joint effusions. This could be degenerative in nature or could reflect the earliest changes of facet joint infection. IMPRESSION: 1. No evidence of discitis osteomyelitis or infected facet joint in the thoracic region. 2. No evidence of degenerative disc disease or disc space infection in the lumbar region. There is some edema associated with the facet joints at L4-5, without frank joint effusions. This could be degenerative in nature or could reflect the earliest changes of facet joint infection. 3. Small pleural effusions layering dependently, right more than left, with dependent atelectasis in the lower lobes right more than left. Focal pulmonary infiltrates as shown by CT yesterday. Electronically Signed   By: Paulina Fusi M.D.   On: 11/04/2019 15:34   MR LUMBAR SPINE WO CONTRAST  Result Date: 11/04/2019 CLINICAL DATA:  Bacteremia.  Back pain. EXAM: MRI THORACIC AND LUMBAR SPINE WITHOUT CONTRAST TECHNIQUE: Multiplanar and multiecho pulse sequences of the thoracic and lumbar spine were obtained without intravenous contrast. The patient refused contrast administration. COMPARISON:  Chest CT yesterday. FINDINGS: MRI THORACIC SPINE FINDINGS Alignment:  Normal Vertebrae: Normal Cord:  Normal Paraspinal and other soft tissues: Small effusions layering dependently, right more than left, with dependent atelectasis in  the lower lobes right more than left. Patchy  infiltrates as were shown by CT. Disc levels: No degenerative disc disease. No stenosis of the canal or foramina. No evidence of discitis osteomyelitis or infected facet joint. MRI LUMBAR SPINE FINDINGS Segmentation:  5 lumbar type vertebral bodies. Alignment:  Normal Vertebrae:  Vertebral bodies are normal. Conus medullaris: Extends to the L1-2 level and appears normal. Paraspinal and other soft tissues: Negative Disc levels: No evidence of degenerative disc disease or disc space infection. Patient has some edema associated with the facet joints at L4-5, without frank joint effusions. This could be degenerative in nature or could reflect the earliest changes of facet joint infection. IMPRESSION: 1. No evidence of discitis osteomyelitis or infected facet joint in the thoracic region. 2. No evidence of degenerative disc disease or disc space infection in the lumbar region. There is some edema associated with the facet joints at L4-5, without frank joint effusions. This could be degenerative in nature or could reflect the earliest changes of facet joint infection. 3. Small pleural effusions layering dependently, right more than left, with dependent atelectasis in the lower lobes right more than left. Focal pulmonary infiltrates as shown by CT yesterday. Electronically Signed   By: Paulina Fusi M.D.   On: 11/04/2019 15:34   CT ANKLE RIGHT W CONTRAST  Result Date: 11/04/2019 CLINICAL DATA:  Foot swelling bacteremia EXAM: CT OF THE RIGHT ANKLE WITH CONTRAST TECHNIQUE: Multidetector CT imaging of the right ankle was performed following the standard protocol during bolus administration of intravenous contrast. CONTRAST:  OMNIPAQUE IOHEXOL 300 MG/ML  SOLN COMPARISON:  None. FINDINGS: Bones/Joint/Cartilage No fracture or dislocation. No areas of cortical destruction or periosteal reaction are seen. The articular surfaces appear maintained. No large ankle joint effusion. Ligaments Suboptimally assessed by CT.  Muscles and Tendons The muscles surrounding the ankle and foot appear to be intact without focal atrophy or tear. The flexor and extensor tendons are intact. The Achilles tendon is intact. Soft tissues: There is subcutaneous edema seen along the posterior heel pad with mild skin thickening. There also appears to be mildly heterogeneous density with thickening in the middle cord of the plantar fascia, however it is intact. No loculated fluid collections are noted. IMPRESSION: Findings which could be suggestive of diffuse cellulitis along the heel pad. No loculated fluid collections or definite evidence of osteomyelitis. Electronically Signed   By: Jonna Clark M.D.   On: 11/04/2019 21:45   CT FOOT LEFT W CONTRAST  Result Date: 11/04/2019 CLINICAL DATA:  Foot swelling bacteremia EXAM: CT OF THE RIGHT ANKLE WITH CONTRAST TECHNIQUE: Multidetector CT imaging of the right ankle was performed following the standard protocol during bolus administration of intravenous contrast. CONTRAST:  OMNIPAQUE IOHEXOL 300 MG/ML  SOLN COMPARISON:  None. FINDINGS: Bones/Joint/Cartilage No fracture or dislocation. No areas of cortical destruction or periosteal reaction are seen. The articular surfaces appear maintained. No large ankle joint effusion. Ligaments Suboptimally assessed by CT. Muscles and Tendons The muscles surrounding the ankle and foot appear to be intact without focal atrophy or tear. The flexor and extensor tendons are intact. The Achilles tendon is intact. Soft tissues: There is subcutaneous edema seen along the posterior heel pad with mild skin thickening. There also appears to be mildly heterogeneous density with thickening in the middle cord of the plantar fascia, however it is intact. No loculated fluid collections are noted. IMPRESSION: Findings which could be suggestive of diffuse cellulitis along the heel pad. No loculated fluid collections  or definite evidence of osteomyelitis. Electronically Signed    By: Jonna Clark M.D.   On: 11/04/2019 21:45   DG Chest Port 1 View  Result Date: 11/03/2019 CLINICAL DATA:  Sepsis, IV drug use EXAM: PORTABLE CHEST 1 VIEW COMPARISON:  09/07/2014 FINDINGS: Since the prior examination, there have developed multiple peripherally distributed pulmonary nodules within the lungs bilaterally. Given the patient's history of drug use, septic embolization should be considered. Atypical infection, such as fungal pneumonia, or metastatic disease are considered less likely, but could present similarly. No confluent pulmonary infiltrates. Cardiac size within normal limits. Pulmonary vascularity is normal. No acute bone abnormality. IMPRESSION: Interval development of multiple pulmonary nodules. This could be better assessed with CT examination, if indicated. Electronically Signed   By: Helyn Numbers MD   On: 11/03/2019 08:58   ECHOCARDIOGRAM COMPLETE  Result Date: 11/04/2019    ECHOCARDIOGRAM REPORT   Patient Name:   FLOREINE KINGDON Date of Exam: 11/04/2019 Medical Rec #:  [id number]        Height:       62.0 in Accession #:    [id number]       Weight:       105.0 lb Date of Birth:  January 21, 1990         BSA:          1.454 m Patient Age:    29 years         BP:           95/58 mmHg Patient Gender: F                HR:           97 bpm. Exam Location:  Inpatient Procedure: 2D Echo Indications:    Bacteremia  History:        Patient has no prior history of Echocardiogram examinations.                 Risk Factors:IV Drug abuse.  Sonographer:    Thurman Coyer RDCS (AE) Referring Phys: [id number] Teddy Spike IMPRESSIONS  1. Left ventricular ejection fraction, by estimation, is 60 to 65%. The left ventricle has normal function. The left ventricle has no regional wall motion abnormalities. Left ventricular diastolic parameters were normal.  2. Right ventricular systolic function There is an ill defined mass encompassing a large portion of the RV cavity. Recommend cardiac MRI for further  evaulation.. The right ventricular size is normal. There is normal pulmonary artery systolic pressure. The estimated right ventricular systolic pressure is 18.4 mmHg.  3. The mitral valve is normal in structure. No evidence of mitral valve regurgitation. No evidence of mitral stenosis.  4. Possible vegetation on TV leaflet. Consider TEE for further evaulation. . The tricuspid valve is abnormal.  5. The aortic valve was not well visualized. Aortic valve regurgitation is not visualized. No aortic stenosis is present.  6. The inferior vena cava is normal in size with greater than 50% respiratory variability, suggesting right atrial pressure of 3 mmHg.Recommend Cardiac MRI to evaluated abnormality in RV cavity. ALso ecoh concerning for TV endocardidits. FINDINGS  Left Ventricle: Left ventricular ejection fraction, by estimation, is 60 to 65%. The left ventricle has normal function. The left ventricle has no regional wall motion abnormalities. The left ventricular internal cavity size was normal in size. There is  no left ventricular hypertrophy. Left ventricular diastolic parameters were normal. Normal left ventricular filling pressure. Right Ventricle: The right ventricular size is normal. No  increase in right ventricular wall thickness. Right ventricular systolic function There is an ill defined mass encompassing a large portion of the RV cavity. Recommend cardiac MRI for further evaulation. There is normal pulmonary artery systolic pressure. The tricuspid regurgitant velocity is 1.96 m/s, and with an assumed right atrial pressure of 3 mmHg, the estimated right ventricular systolic pressure is 18.4 mmHg. Left Atrium: Left atrial size was normal in size. Right Atrium: Right atrial size was normal in size. Pericardium: There is no evidence of pericardial effusion. Mitral Valve: The mitral valve is normal in structure. No evidence of mitral valve regurgitation. No evidence of mitral valve stenosis. Tricuspid Valve:  Possible vegetation on TV leaflet. Consider TEE for further evaulation. The tricuspid valve is abnormal. Tricuspid valve regurgitation is mild . No evidence of tricuspid stenosis. Aortic Valve: The aortic valve was not well visualized. Aortic valve regurgitation is not visualized. No aortic stenosis is present. Pulmonic Valve: The pulmonic valve was normal in structure. Pulmonic valve regurgitation is not visualized. No evidence of pulmonic stenosis. Aorta: The aortic root is normal in size and structure. Venous: The inferior vena cava is normal in size with greater than 50% respiratory variability, suggesting right atrial pressure of 3 mmHg. IAS/Shunts: No atrial level shunt detected by color flow Doppler.  LEFT VENTRICLE PLAX 2D LVIDd:         3.59 cm  Diastology LVIDs:         2.31 cm  LV e' medial:    9.57 cm/s LV PW:         0.61 cm  LV E/e' medial:  7.8 LV IVS:        0.80 cm  LV e' lateral:   8.70 cm/s LVOT diam:     2.10 cm  LV E/e' lateral: 8.5 LV SV:         55 LV SV Index:   38 LVOT Area:     3.46 cm  RIGHT VENTRICLE RV S prime:     12.70 cm/s TAPSE (M-mode): 2.0 cm LEFT ATRIUM             Index       RIGHT ATRIUM           Index LA diam:        1.60 cm 1.10 cm/m  RA Area:     13.70 cm LA Vol (A2C):   31.6 ml 21.74 ml/m RA Volume:   32.10 ml  22.08 ml/m LA Vol (A4C):   20.8 ml 14.31 ml/m LA Biplane Vol: 26.7 ml 18.37 ml/m  AORTIC VALVE LVOT Vmax:   90.90 cm/s LVOT Vmean:  55.500 cm/s LVOT VTI:    0.158 m  AORTA Ao Root diam: 2.70 cm MITRAL VALVE               TRICUSPID VALVE MV Area (PHT): 3.37 cm    TR Peak grad:   15.4 mmHg MV Decel Time: 225 msec    TR Vmax:        196.00 cm/s MV E velocity: 74.20 cm/s MV A velocity: 60.60 cm/s  SHUNTS MV E/A ratio:  1.22        Systemic VTI:  0.16 m                            Systemic Diam: 2.10 cm Armanda Magic MD Electronically signed by Armanda Magic MD Signature Date/Time: 11/04/2019/2:03:31 PM    Final  ECHO TEE  Result Date: 11/07/2019     TRANSESOPHOGEAL ECHO REPORT   Patient Name:   JEIDI GILLES Date of Exam: 11/07/2019 Medical Rec #:  [id number]        Height:       62.0 in Accession #:    [id number]       Weight:       108.7 lb Date of Birth:  03-04-1990         BSA:          1.475 m Patient Age:    29 years         BP:           108/59 mmHg Patient Gender: F                HR:           77 bpm. Exam Location:  Inpatient Procedure: Transesophageal Echo, 3D Echo, Color Doppler and Cardiac Doppler Indications:     Endocarditis/right ventricular globular mass  History:         Patient has prior history of Echocardiogram examinations, most                  recent 11/04/2019. Endocarditis; Risk Factors:IV Drug abuse.  Sonographer:     Thurman Coyer RDCS (AE) Referring Phys:  [id number] [facility] [place] Diagnosing Phys: [name] MD PROCEDURE: After discussion of the risks and benefits of a TEE, an informed consent was obtained. The transesophogeal probe was passed without difficulty through the esophogus of the patient. Local oropharyngeal anesthetic was provided with Cetacaine. Sedation performed by different physician. The patient was monitored while under deep sedation. Anesthestetic sedation was provided intravenously by Anesthesiology: 311.99mg  of Propofol. The patient's vital signs; including heart rate, blood pressure, and oxygen saturation; remained stable throughout the procedure. The patient developed no complications during the procedure. IMPRESSIONS  1. 3D imaging of TV performed.  2. Left ventricular ejection fraction, by estimation, is 60 to 65%. The left ventricle has normal function. The left ventricle has no regional wall motion abnormalities.  3. Right ventricular systolic function is normal. The right ventricular size is mildly enlarged.  4. No left atrial/left atrial appendage thrombus was detected.  5. Right atrial size was mildly dilated.  6. The mitral valve is normal in structure. Trivial mitral valve regurgitation. No  evidence of mitral stenosis.  7. 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 with flail segment and moderate to severe MR. ? infection extends to subvalvular apparatus with rhind of thickening along the chord/ papillary muscle and RV free wall. 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 The tricuspid valve is abnormal. Tricuspid valve regurgitation is moderate to severe.  8. The aortic valve is tricuspid. Aortic valve regurgitation is not visualized. No aortic stenosis is present.  9. The inferior vena cava is normal in size with greater than 50% respiratory variability, suggesting right atrial pressure of 3 mmHg. Conclusion(s)/Recommendation(s): Normal biventricular function without evidence of hemodynamically significant valvular heart disease. FINDINGS  Left Ventricle: Left ventricular ejection fraction, by estimation, is 60 to 65%. The left ventricle has normal function. The left ventricle has no regional wall motion abnormalities. The left ventricular internal cavity size was normal in size. There is  no left ventricular hypertrophy. Right Ventricle: The right ventricular size is mildly enlarged. No increase in right ventricular wall thickness. Right ventricular systolic function is normal. Left Atrium:  Left atrial size was normal in size. No left atrial/left atrial appendage thrombus was detected. Right Atrium: Right atrial size was mildly dilated. Pericardium: There is no evidence of pericardial effusion. Mitral Valve: The mitral valve is normal in structure. Trivial mitral valve regurgitation. No evidence of mitral valve stenosis. There is no evidence of mitral valve vegetation. Tricuspid Valve: 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 with flail segment and moderate to severe MR. ? infection extends to subvalvular apparatus with rhind of thickening along the chord/ papillary muscle and RV free wall.  Similar appearance  to eosinophilic disease or Loefler's syndrome Would f/u cardiac MRI to further assess tissue characteristics. The tricuspid valve is abnormal. Tricuspid valve regurgitation is moderate to severe. No evidence of tricuspid stenosis. Aortic Valve: The aortic valve is tricuspid. Aortic valve regurgitation is not visualized. No aortic stenosis is present. There is no evidence of aortic valve vegetation. Pulmonic Valve: The pulmonic valve was normal in structure. Pulmonic valve regurgitation is trivial. No evidence of pulmonic stenosis. There is no evidence of pulmonic valve vegetation. Aorta: The aortic root is normal in size and structure. Venous: The inferior vena cava is normal in size with greater than 50% respiratory variability, suggesting right atrial pressure of 3 mmHg. IAS/Shunts: No atrial level shunt detected by color flow Doppler. Additional Comments: 3D imaging of TV performed.  TRICUSPID VALVE TR Peak grad:   22.7 mmHg TR Vmax:        238.00 cm/s Charlton HawsPeter Nishan MD Electronically signed by Charlton HawsPeter Nishan MD Signature Date/Time: 11/07/2019/1:48:21 PM    Final           Discharge Exam: Vitals:   11/07/19 1400 11/07/19 1445  BP: 113/76 113/79  Pulse: 77 96  Resp: (!) 26 20  Temp:  98.3 F (36.8 C)  SpO2: 97% 100%   Vitals:   11/07/19 1335 11/07/19 1350 11/07/19 1400 11/07/19 1445  BP: (!) 108/59 106/73 113/76 113/79  Pulse: 77 72 77 96  Resp: (!) 37 (!) 32 (!) 26 20  Temp:    98.3 F (36.8 C)  TempSrc:    Oral  SpO2: 99% 100% 97% 100%  Weight:        \   The results of significant diagnostics from this hospitalization (including imaging, microbiology, ancillary and laboratory) are listed below for reference.     Microbiology: Recent Results (from the past 240 hour(s))  Urine culture     Status: Abnormal   Collection Time: 11/03/19  8:18 AM   Specimen: In/Out Cath Urine  Result Value Ref Range Status   Specimen Description   Final    IN/OUT CATH  URINE Performed at [facility], [address] [place], [place] [address]    Special Requests   Final    NONE Performed at [facility], [address] [place], [place] [address]    Culture >=100,000 COLONIES/mL ESCHERICHIA COLI (A)  Final   Report Status 11/05/2019 FINAL  Final   Organism ID, Bacteria ESCHERICHIA COLI (A)  Final      Susceptibility   Escherichia coli - MIC*    AMPICILLIN 4 SENSITIVE Sensitive     CEFAZOLIN <=4 SENSITIVE Sensitive     CEFTRIAXONE <=0.25 SENSITIVE Sensitive     CIPROFLOXACIN <=0.25 SENSITIVE Sensitive     GENTAMICIN <=1 SENSITIVE Sensitive     IMIPENEM <=0.25 SENSITIVE Sensitive     NITROFURANTOIN <=16 SENSITIVE Sensitive     TRIMETH/SULFA <=20 SENSITIVE Sensitive  AMPICILLIN/SULBACTAM <=2 SENSITIVE Sensitive     PIP/TAZO <=4 SENSITIVE Sensitive     * >=100,000 COLONIES/mL ESCHERICHIA COLI  Respiratory Panel by RT PCR (Flu A&B, Covid) - Nasopharyngeal Swab     Status: None   Collection Time: 11/03/19  9:13 AM   Specimen: Nasopharyngeal Swab  Result Value Ref Range Status   SARS Coronavirus 2 by RT PCR NEGATIVE NEGATIVE Final    Comment: (NOTE) SARS-CoV-2 target nucleic acids are NOT DETECTED.  The SARS-CoV-2 RNA is generally detectable in upper respiratoy specimens during the acute phase of infection. The lowest concentration of SARS-CoV-2 viral copies this assay can detect is 131 copies/mL. A negative result does not preclude SARS-Cov-2 infection and should not be used as the sole basis for treatment or other patient management decisions. A negative result may occur with  improper specimen collection/handling, submission of specimen other than nasopharyngeal swab, presence of viral mutation(s) within the areas targeted by this assay, and inadequate number of viral copies (<131 copies/mL). A negative result must be combined with clinical observations, patient history, and epidemiological information.  The expected result is Negative.  Fact Sheet for Patients:  https://www.moore.com/  Fact Sheet for Healthcare Providers:  https://www.young.biz/  This test is no t yet approved or cleared by the [place] FDA and  has been authorized for detection and/or diagnosis of SARS-CoV-2 by FDA under an Emergency Use Authorization (EUA). This EUA will remain  in effect (meaning this test can be used) for the duration of the COVID-19 declaration under Section 564(b)(1) of the Act, 21 U.S.C. section 360bbb-3(b)(1), unless the authorization is terminated or revoked sooner.     Influenza A by PCR NEGATIVE NEGATIVE Final   Influenza B by PCR NEGATIVE NEGATIVE Final    Comment: (NOTE) The Xpert Xpress SARS-CoV-2/FLU/RSV assay is intended as an aid in  the diagnosis of influenza from Nasopharyngeal swab specimens and  should not be used as a sole basis for treatment. Nasal washings and  aspirates are unacceptable for Xpert Xpress SARS-CoV-2/FLU/RSV  testing.  Fact Sheet for Patients: https://www.moore.com/  Fact Sheet for Healthcare Providers: https://www.young.biz/  This test is not yet approved or cleared by the [place] FDA and  has been authorized for detection and/or diagnosis of SARS-CoV-2 by  FDA under an Emergency Use Authorization (EUA). This EUA will remain  in effect (meaning this test can be used) for the duration of the  Covid-19 declaration under Section 564(b)(1) of the Act, 21  U.S.C. section 360bbb-3(b)(1), unless the authorization is  terminated or revoked. Performed at [facility], [address] [place], [place] [address]   Blood Culture (routine x 2)     Status: Abnormal   Collection Time: 11/03/19  9:50 AM   Specimen: BLOOD RIGHT HAND  Result Value Ref Range Status   Specimen Description   Final    BLOOD RIGHT HAND Performed at [facility],  [address] [place], [place] [address]    Special Requests   Final    BOTTLES DRAWN AEROBIC ONLY Blood Culture adequate volume Performed at [facility], [address], [place], [place] [address]    Culture  Setup Time   Final    AEROBIC BOTTLE ONLY GRAM POSITIVE COCCI CRITICAL VALUE NOTED.  VALUE IS CONSISTENT WITH PREVIOUSLY REPORTED AND CALLED VALUE.    Culture (A)  Final    STAPHYLOCOCCUS AUREUS SUSCEPTIBILITIES PERFORMED ON PREVIOUS CULTURE WITHIN THE LAST 5 DAYS. Performed at [facility], [address] [place], [place] [address]    Report Status 11/05/2019 FINAL  Final  Blood Culture (routine x 2)     Status: Abnormal   Collection Time: 11/03/19  9:50 AM   Specimen: BLOOD  Result Value Ref Range Status   Specimen Description   Final    BLOOD LEFT ARM Performed at [facility], [address] [place], [place] [address]    Special Requests   Final    BOTTLES DRAWN AEROBIC AND ANAEROBIC Blood Culture adequate volume Performed at [facility], [address] [place], [place] [address]    Culture  Setup Time   Final    IN BOTH AEROBIC AND ANAEROBIC BOTTLES GRAM POSITIVE COCCI CRITICAL VALUE NOTED.  VALUE IS CONSISTENT WITH PREVIOUSLY REPORTED AND CALLED VALUE.    Culture (A)  Final    STAPHYLOCOCCUS AUREUS SUSCEPTIBILITIES PERFORMED ON PREVIOUS CULTURE WITHIN THE LAST 5 DAYS. Performed at [facility], [address], [place], [place] [address]    Report Status 11/05/2019 FINAL  Final  Culture, blood (Routine X 2) w Reflex to ID Panel     Status: Abnormal   Collection Time: 11/03/19  9:52 AM   Specimen: BLOOD  Result Value Ref Range Status   Specimen Description   Final    BLOOD RIGHT ARM Performed at [facility], [address] [place], [place] [address]    Special Requests   Final    BOTTLES DRAWN AEROBIC ONLY Blood Culture adequate volume Performed at [facility], [address] [place], [place] [address]    Culture  Setup Time   Final    AEROBIC BOTTLE ONLY GRAM POSITIVE COCCI Organism ID to follow CRITICAL RESULT CALLED TO, READ BACK BY AND VERIFIED WITH:  PHARMD E JACKSON 11/03/19 AT 1157 SK Performed at [facility], [address] [place] [address], [place], [place] [address]    Culture METHICILLIN RESISTANT STAPHYLOCOCCUS AUREUS (A)  Final   Report Status 11/05/2019 FINAL  Final   Organism ID, Bacteria METHICILLIN RESISTANT STAPHYLOCOCCUS AUREUS  Final      Susceptibility   Methicillin resistant staphylococcus aureus - MIC*    CIPROFLOXACIN >=8 RESISTANT Resistant     ERYTHROMYCIN >=8 RESISTANT Resistant     GENTAMICIN <=0.5 SENSITIVE Sensitive     OXACILLIN >=4 RESISTANT Resistant     TETRACYCLINE <=1 SENSITIVE Sensitive     VANCOMYCIN <=0.5 SENSITIVE Sensitive     TRIMETH/SULFA 160 RESISTANT Resistant     CLINDAMYCIN >=8 RESISTANT Resistant     RIFAMPIN <=0.5 SENSITIVE Sensitive     Inducible Clindamycin NEGATIVE Sensitive     * METHICILLIN RESISTANT STAPHYLOCOCCUS AUREUS  Blood Culture ID Panel (Reflexed)     Status: Abnormal   Collection Time: 11/03/19  9:52 AM  Result Value Ref Range Status   Enterococcus faecalis NOT DETECTED NOT DETECTED Final   Enterococcus Faecium NOT DETECTED NOT DETECTED Final   Listeria monocytogenes NOT DETECTED NOT DETECTED Final   Staphylococcus species DETECTED (A) NOT DETECTED Final    Comment: CRITICAL RESULT CALLED TO, READ BACK BY AND VERIFIED WITH: PHARMD E JACKSON 11/03/19 AT 2357 SK    Staphylococcus aureus (BCID) DETECTED (A) NOT DETECTED Final    Comment: Methicillin (oxacillin)-resistant Staphylococcus aureus (MRSA). MRSA is predictably resistant to beta-lactam antibiotics (except ceftaroline). Preferred therapy is vancomycin unless clinically contraindicated. Patient requires contact precautions if  hospitalized. CRITICAL RESULT CALLED TO, READ BACK BY AND VERIFIED  WITH: PHARMD E  JACKSON 11/03/19 AT 2357 SK    Staphylococcus epidermidis NOT DETECTED NOT DETECTED Final   Staphylococcus lugdunensis NOT DETECTED NOT DETECTED Final   Streptococcus species NOT DETECTED NOT DETECTED Final   Streptococcus agalactiae NOT DETECTED NOT DETECTED Final   Streptococcus pneumoniae NOT DETECTED NOT DETECTED Final   Streptococcus pyogenes NOT DETECTED NOT DETECTED Final   A.calcoaceticus-baumannii NOT DETECTED NOT DETECTED Final   Bacteroides fragilis NOT DETECTED NOT DETECTED Final   Enterobacterales NOT DETECTED NOT DETECTED Final   Enterobacter cloacae complex NOT DETECTED NOT DETECTED Final   Escherichia coli NOT DETECTED NOT DETECTED Final   Klebsiella aerogenes NOT DETECTED NOT DETECTED Final   Klebsiella oxytoca NOT DETECTED NOT DETECTED Final   Klebsiella pneumoniae NOT DETECTED NOT DETECTED Final   Proteus species NOT DETECTED NOT DETECTED Final   Salmonella species NOT DETECTED NOT DETECTED Final   Serratia marcescens NOT DETECTED NOT DETECTED Final   Haemophilus influenzae NOT DETECTED NOT DETECTED Final   Neisseria meningitidis NOT DETECTED NOT DETECTED Final   Pseudomonas aeruginosa NOT DETECTED NOT DETECTED Final   Stenotrophomonas maltophilia NOT DETECTED NOT DETECTED Final   Candida albicans NOT DETECTED NOT DETECTED Final   Candida auris NOT DETECTED NOT DETECTED Final   Candida glabrata NOT DETECTED NOT DETECTED Final   Candida krusei NOT DETECTED NOT DETECTED Final   Candida parapsilosis NOT DETECTED NOT DETECTED Final   Candida tropicalis NOT DETECTED NOT DETECTED Final   Cryptococcus neoformans/gattii NOT DETECTED NOT DETECTED Final   Meth resistant mecA/C and MREJ DETECTED (A) NOT DETECTED Final    Comment: CRITICAL RESULT CALLED TO, READ BACK BY AND VERIFIED WITH: PHARMD E JACKSON 11/03/19 AT 2357 SK Performed at [facility], [address] [place], [place] [address]   Culture, blood (Routine X 2) w Reflex to ID Panel     Status: Abnormal    Collection Time: 11/05/19  8:29 AM   Specimen: BLOOD  Result Value Ref Range Status   Specimen Description BLOOD RIGHT ANTECUBITAL  Final   Special Requests   Final    BOTTLES DRAWN AEROBIC ONLY Blood Culture adequate volume   Culture  Setup Time   Final    GRAM POSITIVE COCCI IN CLUSTERS AEROBIC BOTTLE ONLY CRITICAL RESULT CALLED TO, READ BACK BY AND VERIFIED WITH: J. MILLEN PHARMD, AT 1124 11/06/19    Culture (A)  Final    STAPHYLOCOCCUS AUREUS SUSCEPTIBILITIES PERFORMED ON PREVIOUS CULTURE WITHIN THE LAST 5 DAYS. Performed at [facility], [address], [place], [place] [address]    Report Status 11/08/2019 FINAL  Final  Culture, blood (Routine X 2) w Reflex to ID Panel     Status: Abnormal   Collection Time: 11/05/19  8:29 AM   Specimen: BLOOD RIGHT HAND  Result Value Ref Range Status   Specimen Description BLOOD RIGHT HAND  Final   Special Requests   Final    BOTTLES DRAWN AEROBIC ONLY Blood Culture adequate volume   Culture  Setup Time   Final    GRAM POSITIVE COCCI IN CLUSTERS AEROBIC BOTTLE ONLY CRITICAL VALUE NOTED.  VALUE IS CONSISTENT WITH PREVIOUSLY REPORTED AND CALLED VALUE.    Culture (A)  Final    STAPHYLOCOCCUS AUREUS SUSCEPTIBILITIES PERFORMED ON PREVIOUS CULTURE WITHIN THE LAST 5 DAYS. Performed at [facility], [address], [place], [place] [address]    Report Status 11/08/2019 FINAL  Final  Blood Culture ID Panel (Reflexed)  Status: Abnormal   Collection Time: 11/05/19  8:29 AM  Result Value Ref Range Status   Enterococcus faecalis NOT DETECTED NOT DETECTED Final   Enterococcus Faecium NOT DETECTED NOT DETECTED Final   Listeria monocytogenes NOT DETECTED NOT DETECTED Final   Staphylococcus species DETECTED (A) NOT DETECTED Final    Comment: CRITICAL RESULT CALLED TO, READ BACK BY AND VERIFIED WITH: J. MILLEN PHARMD, AT 1124 11/06/19 BY D. VANHOOK    Staphylococcus aureus (BCID) DETECTED (A) NOT DETECTED Final    Comment:  Methicillin (oxacillin)-resistant Staphylococcus aureus (MRSA). MRSA is predictably resistant to beta-lactam antibiotics (except ceftaroline). Preferred therapy is vancomycin unless clinically contraindicated. Patient requires contact precautions if  hospitalized. CRITICAL RESULT CALLED TO, READ BACK BY AND VERIFIED WITH: J. MILLEN PHARMD, AT 1124 11/06/19 BY D. VANHOOK    Staphylococcus epidermidis NOT DETECTED NOT DETECTED Final   Staphylococcus lugdunensis NOT DETECTED NOT DETECTED Final   Streptococcus species NOT DETECTED NOT DETECTED Final   Streptococcus agalactiae NOT DETECTED NOT DETECTED Final   Streptococcus pneumoniae NOT DETECTED NOT DETECTED Final   Streptococcus pyogenes NOT DETECTED NOT DETECTED Final   A.calcoaceticus-baumannii NOT DETECTED NOT DETECTED Final   Bacteroides fragilis NOT DETECTED NOT DETECTED Final   Enterobacterales NOT DETECTED NOT DETECTED Final   Enterobacter cloacae complex NOT DETECTED NOT DETECTED Final   Escherichia coli NOT DETECTED NOT DETECTED Final   Klebsiella aerogenes NOT DETECTED NOT DETECTED Final   Klebsiella oxytoca NOT DETECTED NOT DETECTED Final   Klebsiella pneumoniae NOT DETECTED NOT DETECTED Final   Proteus species NOT DETECTED NOT DETECTED Final   Salmonella species NOT DETECTED NOT DETECTED Final   Serratia marcescens NOT DETECTED NOT DETECTED Final   Haemophilus influenzae NOT DETECTED NOT DETECTED Final   Neisseria meningitidis NOT DETECTED NOT DETECTED Final   Pseudomonas aeruginosa NOT DETECTED NOT DETECTED Final   Stenotrophomonas maltophilia NOT DETECTED NOT DETECTED Final   Candida albicans NOT DETECTED NOT DETECTED Final   Candida auris NOT DETECTED NOT DETECTED Final   Candida glabrata NOT DETECTED NOT DETECTED Final   Candida krusei NOT DETECTED NOT DETECTED Final   Candida parapsilosis NOT DETECTED NOT DETECTED Final   Candida tropicalis NOT DETECTED NOT DETECTED Final   Cryptococcus neoformans/gattii NOT DETECTED  NOT DETECTED Final   Meth resistant mecA/C and MREJ DETECTED (A) NOT DETECTED Final   Carbapenem resist OXA 48 LIKE NOT DETECTED NOT DETECTED Final   Carbapenem resistance VIM NOT DETECTED NOT DETECTED Final    Comment: Performed at [facility], [address], [place], [place] [address]  Blood culture (routine x 2)     Status: Abnormal   Collection Time: 11/05/19  9:47 PM   Specimen: BLOOD RIGHT ARM  Result Value Ref Range Status   Specimen Description BLOOD RIGHT ARM  Final   Special Requests   Final    BOTTLES DRAWN AEROBIC AND ANAEROBIC Blood Culture adequate volume   Culture  Setup Time   Final    GRAM POSITIVE COCCI IN CLUSTERS IN BOTH AEROBIC AND ANAEROBIC BOTTLES CRITICAL VALUE NOTED.  VALUE IS CONSISTENT WITH PREVIOUSLY REPORTED AND CALLED VALUE. Performed at [facility], [address], [place], [place] [address]    Culture METHICILLIN RESISTANT STAPHYLOCOCCUS AUREUS (A)  Final   Report Status 11/08/2019 FINAL  Final   Organism ID, Bacteria METHICILLIN RESISTANT STAPHYLOCOCCUS AUREUS  Final      Susceptibility   Methicillin resistant staphylococcus aureus - MIC*    CIPROFLOXACIN >=8  RESISTANT Resistant     ERYTHROMYCIN >=8 RESISTANT Resistant     GENTAMICIN <=0.5 SENSITIVE Sensitive     OXACILLIN >=4 RESISTANT Resistant     TETRACYCLINE <=1 SENSITIVE Sensitive     VANCOMYCIN 1 SENSITIVE Sensitive     TRIMETH/SULFA 160 RESISTANT Resistant     CLINDAMYCIN >=8 RESISTANT Resistant     RIFAMPIN <=0.5 SENSITIVE Sensitive     Inducible Clindamycin NEGATIVE Sensitive     * METHICILLIN RESISTANT STAPHYLOCOCCUS AUREUS  Blood culture (routine x 2)     Status: Abnormal   Collection Time: 11/05/19 10:36 PM   Specimen: BLOOD  Result Value Ref Range Status   Specimen Description BLOOD SITE NOT SPECIFIED  Final   Special Requests   Final    BOTTLES DRAWN AEROBIC AND ANAEROBIC Blood Culture adequate volume   Culture  Setup Time   Final    GRAM POSITIVE COCCI IN  CLUSTERS IN BOTH AEROBIC AND ANAEROBIC BOTTLES CRITICAL VALUE NOTED.  VALUE IS CONSISTENT WITH PREVIOUSLY REPORTED AND CALLED VALUE.    Culture (A)  Final    STAPHYLOCOCCUS AUREUS SUSCEPTIBILITIES PERFORMED ON PREVIOUS CULTURE WITHIN THE LAST 5 DAYS. Performed at [facility], [address] [place], [place] [address]    Report Status 11/08/2019 FINAL  Final  Culture, blood (Routine X 2) w Reflex to ID Panel     Status: None (Preliminary result)   Collection Time: 11/07/19  5:20 AM   Specimen: BLOOD RIGHT ARM  Result Value Ref Range Status   Specimen Description BLOOD RIGHT ARM  Final   Special Requests   Final    BOTTLES DRAWN AEROBIC ONLY Blood Culture results may not be optimal due to an excessive volume of blood received in culture bottles   Culture   Final    NO GROWTH 1 DAY Performed at [facility], [address], [place], [place] [address]    Report Status PENDING  Incomplete  Culture, blood (Routine X 2) w Reflex to ID Panel     Status: None (Preliminary result)   Collection Time: 11/07/19  5:35 AM   Specimen: BLOOD RIGHT HAND  Result Value Ref Range Status   Specimen Description BLOOD RIGHT HAND  Final   Special Requests   Final    BOTTLES DRAWN AEROBIC ONLY Blood Culture adequate volume   Culture   Final    NO GROWTH 1 DAY Performed at [facility], [address] [place], [place] [address]    Report Status PENDING  Incomplete     Labs: BNP (last 3 results) No results for input(s): BNP in the last 8760 hours. Basic Metabolic Panel: Recent Labs  Lab 11/03/19 0950 11/03/19 0950 11/03/19 2240 11/04/19 0434 11/05/19 2014 11/06/19 0842 11/07/19 0535  NA 128*  123*   < > 131* 129* 131* 131* 133*  K 4.2  4.1   < > 3.8 3.5 3.4* 3.3* 3.8  CL 89*  86*   < > 95* 96* 99 103 106  CO2 22  22   < > 25 24 20* 20* 18*  GLUCOSE 88  92   < > 105* 134* 148* 117* 132*  BUN 39*  39*   < > 30* 22* 8 9 7   CREATININE 1.33*  1.33*   < > 0.77 0.66  0.66 0.55 0.51  CALCIUM 8.2*  8.0*   < > 7.9* 7.5* 8.3* 7.5* 7.4*  MG  --   --   --   --   --  1.8  --   PHOS 4.2  --  3.6 2.6  --   --   --    < > = values in this interval not displayed.   Liver Function Tests: Recent Labs  Lab 11/03/19 0950 11/03/19 2240 11/04/19 0434 11/04/19 1550 11/05/19 2014  AST 84*  --   --  29 32  ALT 36  --   --  23 22  ALKPHOS 133*  --   --  103 128*  BILITOT 1.0  --   --  0.8 0.4  PROT 7.5  --   --  6.0* 6.9  ALBUMIN 2.2*  2.2* 1.8* 1.5* 1.7* 1.8*   No results for input(s): LIPASE, AMYLASE in the last 168 hours. No results for input(s): AMMONIA in the last 168 hours. CBC: Recent Labs  Lab 11/03/19 0950 11/04/19 0434 11/05/19 2014 11/06/19 0842 11/07/19 0535  WBC 10.7* 10.3 12.7* 10.7* 12.1*  NEUTROABS 9.3*  --  11.0*  --   --   HGB 12.0 9.3* 9.9* 8.2* 7.7*  HCT 35.6* 27.9* 30.8* 24.5* 23.6*  MCV 79.3* 80.2 81.3 78.8* 79.5*  PLT 173 193 346 305 311   Cardiac Enzymes: Recent Labs  Lab 11/03/19 2240  CKTOTAL 90   BNP: Invalid input(s): POCBNP CBG: No results for input(s): GLUCAP in the last 168 hours. D-Dimer No results for input(s): DDIMER in the last 72 hours. Hgb A1c No results for input(s): HGBA1C in the last 72 hours. Lipid Profile No results for input(s): CHOL, HDL, LDLCALC, TRIG, CHOLHDL, LDLDIRECT in the last 72 hours. Thyroid function studies No results for input(s): TSH, T4TOTAL, T3FREE, THYROIDAB in the last 72 hours.  Invalid input(s): FREET3 Anemia work up No results for input(s): VITAMINB12, FOLATE, FERRITIN, TIBC, IRON, RETICCTPCT in the last 72 hours. Urinalysis    Component Value Date/Time   COLORURINE AMBER (A) 11/03/2019 0818   APPEARANCEUR CLOUDY (A) 11/03/2019 0818   APPEARANCEUR Turbid 01/27/2014 1811   LABSPEC 1.017 11/03/2019 0818   LABSPEC 1.011 01/27/2014 1811   PHURINE 5.0 11/03/2019 0818   GLUCOSEU NEGATIVE 11/03/2019 0818   GLUCOSEU Negative 01/27/2014 1811   HGBUR NEGATIVE 11/03/2019 0818    BILIRUBINUR NEGATIVE 11/03/2019 0818   BILIRUBINUR Negative 01/27/2014 1811   KETONESUR NEGATIVE 11/03/2019 0818   PROTEINUR NEGATIVE 11/03/2019 0818   UROBILINOGEN 0.2 10/14/2012 2010   NITRITE NEGATIVE 11/03/2019 0818   LEUKOCYTESUR MODERATE (A) 11/03/2019 0818   LEUKOCYTESUR Negative 01/27/2014 1811   Sepsis Labs Invalid input(s): PROCALCITONIN,  WBC,  LACTICIDVEN Microbiology Recent Results (from the past 240 hour(s))  Urine culture     Status: Abnormal   Collection Time: 11/03/19  8:18 AM   Specimen: In/Out Cath Urine  Result Value Ref Range Status   Specimen Description   Final    IN/OUT CATH URINE Performed at [facility], [address], [place], [place] [address]    Special Requests   Final    NONE Performed at [facility], [address], [place], [place] [address]    Culture >=100,000 COLONIES/mL ESCHERICHIA COLI (A)  Final   Report Status 11/05/2019 FINAL  Final   Organism ID, Bacteria ESCHERICHIA COLI (A)  Final      Susceptibility   Escherichia coli - MIC*    AMPICILLIN 4 SENSITIVE Sensitive     CEFAZOLIN <=4 SENSITIVE Sensitive     CEFTRIAXONE <=0.25 SENSITIVE Sensitive     CIPROFLOXACIN <=0.25 SENSITIVE Sensitive     GENTAMICIN <=1 SENSITIVE  Sensitive     IMIPENEM <=0.25 SENSITIVE Sensitive     NITROFURANTOIN <=16 SENSITIVE Sensitive     TRIMETH/SULFA <=20 SENSITIVE Sensitive     AMPICILLIN/SULBACTAM <=2 SENSITIVE Sensitive     PIP/TAZO <=4 SENSITIVE Sensitive     * >=100,000 COLONIES/mL ESCHERICHIA COLI  Respiratory Panel by RT PCR (Flu A&B, Covid) - Nasopharyngeal Swab     Status: None   Collection Time: 11/03/19  9:13 AM   Specimen: Nasopharyngeal Swab  Result Value Ref Range Status   SARS Coronavirus 2 by RT PCR NEGATIVE NEGATIVE Final    Comment: (NOTE) SARS-CoV-2 target nucleic acids are NOT DETECTED.  The SARS-CoV-2 RNA is generally detectable in upper respiratoy specimens during the acute phase of  infection. The lowest concentration of SARS-CoV-2 viral copies this assay can detect is 131 copies/mL. A negative result does not preclude SARS-Cov-2 infection and should not be used as the sole basis for treatment or other patient management decisions. A negative result may occur with  improper specimen collection/handling, submission of specimen other than nasopharyngeal swab, presence of viral mutation(s) within the areas targeted by this assay, and inadequate number of viral copies (<131 copies/mL). A negative result must be combined with clinical observations, patient history, and epidemiological information. The expected result is Negative.  Fact Sheet for Patients:  https://www.moore.com/  Fact Sheet for Healthcare Providers:  https://www.young.biz/  This test is no t yet approved or cleared by the [place] FDA and  has been authorized for detection and/or diagnosis of SARS-CoV-2 by FDA under an Emergency Use Authorization (EUA). This EUA will remain  in effect (meaning this test can be used) for the duration of the COVID-19 declaration under Section 564(b)(1) of the Act, 21 U.S.C. section 360bbb-3(b)(1), unless the authorization is terminated or revoked sooner.     Influenza A by PCR NEGATIVE NEGATIVE Final   Influenza B by PCR NEGATIVE NEGATIVE Final    Comment: (NOTE) The Xpert Xpress SARS-CoV-2/FLU/RSV assay is intended as an aid in  the diagnosis of influenza from Nasopharyngeal swab specimens and  should not be used as a sole basis for treatment. Nasal washings and  aspirates are unacceptable for Xpert Xpress SARS-CoV-2/FLU/RSV  testing.  Fact Sheet for Patients: https://www.moore.com/  Fact Sheet for Healthcare Providers: https://www.young.biz/  This test is not yet approved or cleared by the [place] FDA and  has been authorized for detection and/or diagnosis of SARS-CoV-2  by  FDA under an Emergency Use Authorization (EUA). This EUA will remain  in effect (meaning this test can be used) for the duration of the  Covid-19 declaration under Section 564(b)(1) of the Act, 21  U.S.C. section 360bbb-3(b)(1), unless the authorization is  terminated or revoked. Performed at [facility], [address], [place], [place] [address]   Blood Culture (routine x 2)     Status: Abnormal   Collection Time: 11/03/19  9:50 AM   Specimen: BLOOD RIGHT HAND  Result Value Ref Range Status   Specimen Description   Final    BLOOD RIGHT HAND Performed at [facility], [address], [place], [place] [address]    Special Requests   Final    BOTTLES DRAWN AEROBIC ONLY Blood Culture adequate volume Performed at [facility], [address], [place], [place] [address]    Culture  Setup Time   Final    AEROBIC BOTTLE ONLY GRAM POSITIVE COCCI CRITICAL VALUE NOTED.  VALUE IS CONSISTENT WITH PREVIOUSLY REPORTED AND  CALLED VALUE.    Culture (A)  Final    STAPHYLOCOCCUS AUREUS SUSCEPTIBILITIES PERFORMED ON PREVIOUS CULTURE WITHIN THE LAST 5 DAYS. Performed at [facility], [address], [place], [place] [address]    Report Status 11/05/2019 FINAL  Final  Blood Culture (routine x 2)     Status: Abnormal   Collection Time: 11/03/19  9:50 AM   Specimen: BLOOD  Result Value Ref Range Status   Specimen Description   Final    BLOOD LEFT ARM Performed at [facility], [address] [place], [place] [address]    Special Requests   Final    BOTTLES DRAWN AEROBIC AND ANAEROBIC Blood Culture adequate volume Performed at [facility], [address], [place], [place] [address]    Culture  Setup Time   Final    IN BOTH AEROBIC AND ANAEROBIC BOTTLES GRAM POSITIVE COCCI CRITICAL VALUE NOTED.  VALUE IS CONSISTENT WITH PREVIOUSLY REPORTED AND CALLED VALUE.    Culture (A)  Final     STAPHYLOCOCCUS AUREUS SUSCEPTIBILITIES PERFORMED ON PREVIOUS CULTURE WITHIN THE LAST 5 DAYS. Performed at [facility], [address] [place], [place] [address]    Report Status 11/05/2019 FINAL  Final  Culture, blood (Routine X 2) w Reflex to ID Panel     Status: Abnormal   Collection Time: 11/03/19  9:52 AM   Specimen: BLOOD  Result Value Ref Range Status   Specimen Description   Final    BLOOD RIGHT ARM Performed at [facility], [address], [place], [place] [address]    Special Requests   Final    BOTTLES DRAWN AEROBIC ONLY Blood Culture adequate volume Performed at [facility], [address] [place], [place] [address]    Culture  Setup Time   Final    AEROBIC BOTTLE ONLY GRAM POSITIVE COCCI Organism ID to follow CRITICAL RESULT CALLED TO, READ BACK BY AND VERIFIED WITH:  PHARMD E JACKSON 11/03/19 AT 1157 SK Performed at [facility], [address], [place], [place] [address]    Culture METHICILLIN RESISTANT STAPHYLOCOCCUS AUREUS (A)  Final   Report Status 11/05/2019 FINAL  Final   Organism ID, Bacteria METHICILLIN RESISTANT STAPHYLOCOCCUS AUREUS  Final      Susceptibility   Methicillin resistant staphylococcus aureus - MIC*    CIPROFLOXACIN >=8 RESISTANT Resistant     ERYTHROMYCIN >=8 RESISTANT Resistant     GENTAMICIN <=0.5 SENSITIVE Sensitive     OXACILLIN >=4 RESISTANT Resistant     TETRACYCLINE <=1 SENSITIVE Sensitive     VANCOMYCIN <=0.5 SENSITIVE Sensitive     TRIMETH/SULFA 160 RESISTANT Resistant     CLINDAMYCIN >=8 RESISTANT Resistant     RIFAMPIN <=0.5 SENSITIVE Sensitive     Inducible Clindamycin NEGATIVE Sensitive     * METHICILLIN RESISTANT STAPHYLOCOCCUS AUREUS  Blood Culture ID Panel (Reflexed)     Status: Abnormal   Collection Time: 11/03/19  9:52 AM  Result Value Ref Range Status   Enterococcus faecalis NOT DETECTED NOT DETECTED Final   Enterococcus Faecium NOT DETECTED NOT DETECTED Final    Listeria monocytogenes NOT DETECTED NOT DETECTED Final   Staphylococcus species DETECTED (A) NOT DETECTED Final    Comment: CRITICAL RESULT CALLED TO, READ BACK BY AND VERIFIED WITH: PHARMD E JACKSON 11/03/19 AT 2357 SK    Staphylococcus aureus (BCID) DETECTED (A) NOT DETECTED Final    Comment: Methicillin (oxacillin)-resistant Staphylococcus aureus (MRSA). MRSA is  predictably resistant to beta-lactam antibiotics (except ceftaroline). Preferred therapy is vancomycin unless clinically contraindicated. Patient requires contact precautions if  hospitalized. CRITICAL RESULT CALLED TO, READ BACK BY AND VERIFIED WITH: PHARMD E JACKSON 11/03/19 AT 2357 SK    Staphylococcus epidermidis NOT DETECTED NOT DETECTED Final   Staphylococcus lugdunensis NOT DETECTED NOT DETECTED Final   Streptococcus species NOT DETECTED NOT DETECTED Final   Streptococcus agalactiae NOT DETECTED NOT DETECTED Final   Streptococcus pneumoniae NOT DETECTED NOT DETECTED Final   Streptococcus pyogenes NOT DETECTED NOT DETECTED Final   A.calcoaceticus-baumannii NOT DETECTED NOT DETECTED Final   Bacteroides fragilis NOT DETECTED NOT DETECTED Final   Enterobacterales NOT DETECTED NOT DETECTED Final   Enterobacter cloacae complex NOT DETECTED NOT DETECTED Final   Escherichia coli NOT DETECTED NOT DETECTED Final   Klebsiella aerogenes NOT DETECTED NOT DETECTED Final   Klebsiella oxytoca NOT DETECTED NOT DETECTED Final   Klebsiella pneumoniae NOT DETECTED NOT DETECTED Final   Proteus species NOT DETECTED NOT DETECTED Final   Salmonella species NOT DETECTED NOT DETECTED Final   Serratia marcescens NOT DETECTED NOT DETECTED Final   Haemophilus influenzae NOT DETECTED NOT DETECTED Final   Neisseria meningitidis NOT DETECTED NOT DETECTED Final   Pseudomonas aeruginosa NOT DETECTED NOT DETECTED Final   Stenotrophomonas maltophilia NOT DETECTED NOT DETECTED Final   Candida albicans NOT DETECTED NOT DETECTED Final   Candida auris NOT  DETECTED NOT DETECTED Final   Candida glabrata NOT DETECTED NOT DETECTED Final   Candida krusei NOT DETECTED NOT DETECTED Final   Candida parapsilosis NOT DETECTED NOT DETECTED Final   Candida tropicalis NOT DETECTED NOT DETECTED Final   Cryptococcus neoformans/gattii NOT DETECTED NOT DETECTED Final   Meth resistant mecA/C and MREJ DETECTED (A) NOT DETECTED Final    Comment: CRITICAL RESULT CALLED TO, READ BACK BY AND VERIFIED WITH: PHARMD E JACKSON 11/03/19 AT 2357 SK Performed at [facility], [address], [place], [place] [address]   Culture, blood (Routine X 2) w Reflex to ID Panel     Status: Abnormal   Collection Time: 11/05/19  8:29 AM   Specimen: BLOOD  Result Value Ref Range Status   Specimen Description BLOOD RIGHT ANTECUBITAL  Final   Special Requests   Final    BOTTLES DRAWN AEROBIC ONLY Blood Culture adequate volume   Culture  Setup Time   Final    GRAM POSITIVE COCCI IN CLUSTERS AEROBIC BOTTLE ONLY CRITICAL RESULT CALLED TO, READ BACK BY AND VERIFIED WITH: J. MILLEN PHARMD, AT 1124 11/06/19    Culture (A)  Final    STAPHYLOCOCCUS AUREUS SUSCEPTIBILITIES PERFORMED ON PREVIOUS CULTURE WITHIN THE LAST 5 DAYS. Performed at [facility], [address], [place], [place] [address]    Report Status 11/08/2019 FINAL  Final  Culture, blood (Routine X 2) w Reflex to ID Panel     Status: Abnormal   Collection Time: 11/05/19  8:29 AM   Specimen: BLOOD RIGHT HAND  Result Value Ref Range Status   Specimen Description BLOOD RIGHT HAND  Final   Special Requests   Final    BOTTLES DRAWN AEROBIC ONLY Blood Culture adequate volume   Culture  Setup Time   Final    GRAM POSITIVE COCCI IN CLUSTERS AEROBIC BOTTLE ONLY CRITICAL VALUE NOTED.  VALUE IS CONSISTENT WITH PREVIOUSLY REPORTED AND CALLED VALUE.    Culture (A)  Final    STAPHYLOCOCCUS AUREUS SUSCEPTIBILITIES PERFORMED ON PREVIOUS CULTURE WITHIN THE LAST 5 DAYS. Performed  at [facility], [address], [place], [place] [address]    Report Status 11/08/2019 FINAL  Final  Blood Culture ID Panel (Reflexed)     Status: Abnormal   Collection Time: 11/05/19  8:29 AM  Result Value Ref Range Status   Enterococcus faecalis NOT DETECTED NOT DETECTED Final   Enterococcus Faecium NOT DETECTED NOT DETECTED Final   Listeria monocytogenes NOT DETECTED NOT DETECTED Final   Staphylococcus species DETECTED (A) NOT DETECTED Final    Comment: CRITICAL RESULT CALLED TO, READ BACK BY AND VERIFIED WITH: J. MILLEN PHARMD, AT 1124 11/06/19 BY D. VANHOOK    Staphylococcus aureus (BCID) DETECTED (A) NOT DETECTED Final    Comment: Methicillin (oxacillin)-resistant Staphylococcus aureus (MRSA). MRSA is predictably resistant to beta-lactam antibiotics (except ceftaroline). Preferred therapy is vancomycin unless clinically contraindicated. Patient requires contact precautions if  hospitalized. CRITICAL RESULT CALLED TO, READ BACK BY AND VERIFIED WITH: J. MILLEN PHARMD, AT 1124 11/06/19 BY D. VANHOOK    Staphylococcus epidermidis NOT DETECTED NOT DETECTED Final   Staphylococcus lugdunensis NOT DETECTED NOT DETECTED Final   Streptococcus species NOT DETECTED NOT DETECTED Final   Streptococcus agalactiae NOT DETECTED NOT DETECTED Final   Streptococcus pneumoniae NOT DETECTED NOT DETECTED Final   Streptococcus pyogenes NOT DETECTED NOT DETECTED Final   A.calcoaceticus-baumannii NOT DETECTED NOT DETECTED Final   Bacteroides fragilis NOT DETECTED NOT DETECTED Final   Enterobacterales NOT DETECTED NOT DETECTED Final   Enterobacter cloacae complex NOT DETECTED NOT DETECTED Final   Escherichia coli NOT DETECTED NOT DETECTED Final   Klebsiella aerogenes NOT DETECTED NOT DETECTED Final   Klebsiella oxytoca NOT DETECTED NOT DETECTED Final   Klebsiella pneumoniae NOT DETECTED NOT DETECTED Final   Proteus species NOT DETECTED NOT DETECTED Final   Salmonella species NOT DETECTED NOT DETECTED Final   Serratia marcescens NOT  DETECTED NOT DETECTED Final   Haemophilus influenzae NOT DETECTED NOT DETECTED Final   Neisseria meningitidis NOT DETECTED NOT DETECTED Final   Pseudomonas aeruginosa NOT DETECTED NOT DETECTED Final   Stenotrophomonas maltophilia NOT DETECTED NOT DETECTED Final   Candida albicans NOT DETECTED NOT DETECTED Final   Candida auris NOT DETECTED NOT DETECTED Final   Candida glabrata NOT DETECTED NOT DETECTED Final   Candida krusei NOT DETECTED NOT DETECTED Final   Candida parapsilosis NOT DETECTED NOT DETECTED Final   Candida tropicalis NOT DETECTED NOT DETECTED Final   Cryptococcus neoformans/gattii NOT DETECTED NOT DETECTED Final   Meth resistant mecA/C and MREJ DETECTED (A) NOT DETECTED Final   Carbapenem resist OXA 48 LIKE NOT DETECTED NOT DETECTED Final   Carbapenem resistance VIM NOT DETECTED NOT DETECTED Final    Comment: Performed at [facility], [address] [place], [place] [address]  Blood culture (routine x 2)     Status: Abnormal   Collection Time: 11/05/19  9:47 PM   Specimen: BLOOD RIGHT ARM  Result Value Ref Range Status   Specimen Description BLOOD RIGHT ARM  Final   Special Requests   Final    BOTTLES DRAWN AEROBIC AND ANAEROBIC Blood Culture adequate volume   Culture  Setup Time   Final    GRAM POSITIVE COCCI IN CLUSTERS IN BOTH AEROBIC AND ANAEROBIC BOTTLES CRITICAL VALUE NOTED.  VALUE IS CONSISTENT WITH PREVIOUSLY REPORTED AND CALLED VALUE. Performed at [facility], [address], [place], [place] [address]    Culture METHICILLIN RESISTANT STAPHYLOCOCCUS AUREUS (A)  Final   Report Status 11/08/2019 FINAL  Final   Organism ID, Bacteria METHICILLIN RESISTANT STAPHYLOCOCCUS AUREUS  Final      Susceptibility   Methicillin resistant staphylococcus aureus - MIC*    CIPROFLOXACIN >=8 RESISTANT Resistant     ERYTHROMYCIN >=8 RESISTANT Resistant     GENTAMICIN <=0.5 SENSITIVE Sensitive     OXACILLIN >=4 RESISTANT Resistant     TETRACYCLINE <=1 SENSITIVE  Sensitive     VANCOMYCIN 1 SENSITIVE Sensitive     TRIMETH/SULFA 160 RESISTANT Resistant     CLINDAMYCIN >=8 RESISTANT Resistant     RIFAMPIN <=0.5 SENSITIVE Sensitive     Inducible Clindamycin NEGATIVE Sensitive     * METHICILLIN RESISTANT STAPHYLOCOCCUS AUREUS  Blood culture (routine x 2)     Status: Abnormal   Collection Time: 11/05/19 10:36 PM   Specimen: BLOOD  Result Value Ref Range Status   Specimen Description BLOOD SITE NOT SPECIFIED  Final   Special Requests   Final    BOTTLES DRAWN AEROBIC AND ANAEROBIC Blood Culture adequate volume   Culture  Setup Time   Final    GRAM POSITIVE COCCI IN CLUSTERS IN BOTH AEROBIC AND ANAEROBIC BOTTLES CRITICAL VALUE NOTED.  VALUE IS CONSISTENT WITH PREVIOUSLY REPORTED AND CALLED VALUE.    Culture (A)  Final    STAPHYLOCOCCUS AUREUS SUSCEPTIBILITIES PERFORMED ON PREVIOUS CULTURE WITHIN THE LAST 5 DAYS. Performed at [facility], [address] [place], [place] [address]    Report Status 11/08/2019 FINAL  Final  Culture, blood (Routine X 2) w Reflex to ID Panel     Status: None (Preliminary result)   Collection Time: 11/07/19  5:20 AM   Specimen: BLOOD RIGHT ARM  Result Value Ref Range Status   Specimen Description BLOOD RIGHT ARM  Final   Special Requests   Final    BOTTLES DRAWN AEROBIC ONLY Blood Culture results may not be optimal due to an excessive volume of blood received in culture bottles   Culture   Final    NO GROWTH 1 DAY Performed at [facility], [address] [place], [place] [address]    Report Status PENDING  Incomplete  Culture, blood (Routine X 2) w Reflex to ID Panel     Status: None (Preliminary result)   Collection Time: 11/07/19  5:35 AM   Specimen: BLOOD RIGHT HAND  Result Value Ref Range Status   Specimen Description BLOOD RIGHT HAND  Final   Special Requests   Final    BOTTLES DRAWN AEROBIC ONLY Blood Culture adequate volume   Culture   Final    NO GROWTH 1 DAY Performed at [facility], [address], [place], [place] [address]    Report Status PENDING  Incomplete      SIGNED:   Kathlen Mody, MD  [facility] 11/08/2019, 12:27 PM

## 2019-11-09 NOTE — Addendum Note (Signed)
Addendum  created 11/09/19 1655 by Elmer Picker, MD   Attestation recorded in Intraprocedure, Flowsheet accepted, Intraprocedure Attestations filed

## 2019-11-12 LAB — CULTURE, BLOOD (ROUTINE X 2)
Culture: NO GROWTH
Culture: NO GROWTH
Special Requests: ADEQUATE

## 2019-11-13 LAB — BLOOD CULTURE ID PANEL (REFLEXED) - BCID2
A.calcoaceticus-baumannii: NOT DETECTED
Bacteroides fragilis: NOT DETECTED
Candida albicans: NOT DETECTED
Candida auris: NOT DETECTED
Candida glabrata: NOT DETECTED
Candida krusei: NOT DETECTED
Candida parapsilosis: NOT DETECTED
Candida tropicalis: NOT DETECTED
Carbapenem resist OXA 48 LIKE: NOT DETECTED
Carbapenem resistance VIM: NOT DETECTED
Cryptococcus neoformans/gattii: NOT DETECTED
Enterobacter cloacae complex: NOT DETECTED
Enterobacterales: NOT DETECTED
Enterococcus Faecium: NOT DETECTED
Enterococcus faecalis: NOT DETECTED
Escherichia coli: NOT DETECTED
Haemophilus influenzae: NOT DETECTED
Klebsiella aerogenes: NOT DETECTED
Klebsiella oxytoca: NOT DETECTED
Klebsiella pneumoniae: NOT DETECTED
Listeria monocytogenes: NOT DETECTED
Meth resistant mecA/C and MREJ: DETECTED — AB
Neisseria meningitidis: NOT DETECTED
Proteus species: NOT DETECTED
Pseudomonas aeruginosa: NOT DETECTED
Salmonella species: NOT DETECTED
Serratia marcescens: NOT DETECTED
Staphylococcus aureus (BCID): DETECTED — AB
Staphylococcus epidermidis: NOT DETECTED
Staphylococcus lugdunensis: NOT DETECTED
Staphylococcus species: DETECTED — AB
Stenotrophomonas maltophilia: NOT DETECTED
Streptococcus agalactiae: NOT DETECTED
Streptococcus pneumoniae: NOT DETECTED
Streptococcus pyogenes: NOT DETECTED
Streptococcus species: NOT DETECTED

## 2019-11-30 ENCOUNTER — Other Ambulatory Visit: Payer: Self-pay

## 2019-11-30 ENCOUNTER — Encounter (HOSPITAL_COMMUNITY): Payer: Self-pay | Admitting: Emergency Medicine

## 2019-11-30 ENCOUNTER — Inpatient Hospital Stay (HOSPITAL_COMMUNITY): Payer: Self-pay

## 2019-11-30 ENCOUNTER — Emergency Department (HOSPITAL_COMMUNITY): Payer: Self-pay

## 2019-11-30 ENCOUNTER — Inpatient Hospital Stay (HOSPITAL_COMMUNITY)
Admission: EM | Admit: 2019-11-30 | Discharge: 2020-01-16 | DRG: 981 | Disposition: A | Discharge status: Home / Self Care | Payer: Self-pay | Attending: Internal Medicine | Admitting: Internal Medicine

## 2019-11-30 DIAGNOSIS — E878 Other disorders of electrolyte and fluid balance, not elsewhere classified: Secondary | ICD-10-CM | POA: Diagnosis not present

## 2019-11-30 DIAGNOSIS — F1193 Opioid use, unspecified with withdrawal: Secondary | ICD-10-CM | POA: Diagnosis present

## 2019-11-30 DIAGNOSIS — D6959 Other secondary thrombocytopenia: Secondary | ICD-10-CM | POA: Diagnosis present

## 2019-11-30 DIAGNOSIS — J90 Pleural effusion, not elsewhere classified: Secondary | ICD-10-CM | POA: Diagnosis not present

## 2019-11-30 DIAGNOSIS — Y838 Other surgical procedures as the cause of abnormal reaction of the patient, or of later complication, without mention of misadventure at the time of the procedure: Secondary | ICD-10-CM | POA: Diagnosis not present

## 2019-11-30 DIAGNOSIS — T801XXD Vascular complications following infusion, transfusion and therapeutic injection, subsequent encounter: Secondary | ICD-10-CM

## 2019-11-30 DIAGNOSIS — R0902 Hypoxemia: Secondary | ICD-10-CM

## 2019-11-30 DIAGNOSIS — I97622 Postprocedural seroma of a circulatory system organ or structure following other procedure: Secondary | ICD-10-CM | POA: Diagnosis not present

## 2019-11-30 DIAGNOSIS — Z20822 Contact with and (suspected) exposure to covid-19: Secondary | ICD-10-CM | POA: Diagnosis present

## 2019-11-30 DIAGNOSIS — Z9689 Presence of other specified functional implants: Secondary | ICD-10-CM

## 2019-11-30 DIAGNOSIS — J95821 Acute postprocedural respiratory failure: Secondary | ICD-10-CM | POA: Diagnosis not present

## 2019-11-30 DIAGNOSIS — E0590 Thyrotoxicosis, unspecified without thyrotoxic crisis or storm: Secondary | ICD-10-CM

## 2019-11-30 DIAGNOSIS — F411 Generalized anxiety disorder: Secondary | ICD-10-CM | POA: Diagnosis present

## 2019-11-30 DIAGNOSIS — A4902 Methicillin resistant Staphylococcus aureus infection, unspecified site: Secondary | ICD-10-CM | POA: Diagnosis present

## 2019-11-30 DIAGNOSIS — I2690 Septic pulmonary embolism without acute cor pulmonale: Secondary | ICD-10-CM | POA: Diagnosis present

## 2019-11-30 DIAGNOSIS — B182 Chronic viral hepatitis C: Secondary | ICD-10-CM | POA: Diagnosis present

## 2019-11-30 DIAGNOSIS — J869 Pyothorax without fistula: Secondary | ICD-10-CM

## 2019-11-30 DIAGNOSIS — Z87891 Personal history of nicotine dependence: Secondary | ICD-10-CM

## 2019-11-30 DIAGNOSIS — E875 Hyperkalemia: Secondary | ICD-10-CM | POA: Diagnosis not present

## 2019-11-30 DIAGNOSIS — K219 Gastro-esophageal reflux disease without esophagitis: Secondary | ICD-10-CM | POA: Diagnosis present

## 2019-11-30 DIAGNOSIS — F1123 Opioid dependence with withdrawal: Secondary | ICD-10-CM | POA: Diagnosis present

## 2019-11-30 DIAGNOSIS — R109 Unspecified abdominal pain: Secondary | ICD-10-CM

## 2019-11-30 DIAGNOSIS — D649 Anemia, unspecified: Secondary | ICD-10-CM

## 2019-11-30 DIAGNOSIS — J948 Other specified pleural conditions: Secondary | ICD-10-CM | POA: Diagnosis not present

## 2019-11-30 DIAGNOSIS — I079 Rheumatic tricuspid valve disease, unspecified: Secondary | ICD-10-CM | POA: Diagnosis present

## 2019-11-30 DIAGNOSIS — Z8249 Family history of ischemic heart disease and other diseases of the circulatory system: Secondary | ICD-10-CM

## 2019-11-30 DIAGNOSIS — Z91030 Bee allergy status: Secondary | ICD-10-CM

## 2019-11-30 DIAGNOSIS — I330 Acute and subacute infective endocarditis: Secondary | ICD-10-CM

## 2019-11-30 DIAGNOSIS — A4102 Sepsis due to Methicillin resistant Staphylococcus aureus: Secondary | ICD-10-CM | POA: Diagnosis present

## 2019-11-30 DIAGNOSIS — I76 Septic arterial embolism: Secondary | ICD-10-CM

## 2019-11-30 DIAGNOSIS — E43 Unspecified severe protein-calorie malnutrition: Secondary | ICD-10-CM | POA: Diagnosis present

## 2019-11-30 DIAGNOSIS — Z9289 Personal history of other medical treatment: Secondary | ICD-10-CM

## 2019-11-30 DIAGNOSIS — J860 Pyothorax with fistula: Secondary | ICD-10-CM

## 2019-11-30 DIAGNOSIS — D638 Anemia in other chronic diseases classified elsewhere: Secondary | ICD-10-CM | POA: Diagnosis present

## 2019-11-30 DIAGNOSIS — Y92008 Other place in unspecified non-institutional (private) residence as the place of occurrence of the external cause: Secondary | ICD-10-CM

## 2019-11-30 DIAGNOSIS — R7401 Elevation of levels of liver transaminase levels: Secondary | ICD-10-CM

## 2019-11-30 DIAGNOSIS — Z4682 Encounter for fitting and adjustment of non-vascular catheter: Secondary | ICD-10-CM

## 2019-11-30 DIAGNOSIS — R6521 Severe sepsis with septic shock: Secondary | ICD-10-CM | POA: Diagnosis present

## 2019-11-30 DIAGNOSIS — E876 Hypokalemia: Secondary | ICD-10-CM | POA: Diagnosis not present

## 2019-11-30 DIAGNOSIS — J9601 Acute respiratory failure with hypoxia: Secondary | ICD-10-CM

## 2019-11-30 DIAGNOSIS — E162 Hypoglycemia, unspecified: Secondary | ICD-10-CM | POA: Diagnosis not present

## 2019-11-30 DIAGNOSIS — I808 Phlebitis and thrombophlebitis of other sites: Secondary | ICD-10-CM | POA: Diagnosis present

## 2019-11-30 DIAGNOSIS — Z0189 Encounter for other specified special examinations: Secondary | ICD-10-CM

## 2019-11-30 DIAGNOSIS — T8089XA Other complications following infusion, transfusion and therapeutic injection, initial encounter: Secondary | ICD-10-CM | POA: Diagnosis not present

## 2019-11-30 DIAGNOSIS — R0781 Pleurodynia: Secondary | ICD-10-CM

## 2019-11-30 DIAGNOSIS — I071 Rheumatic tricuspid insufficiency: Secondary | ICD-10-CM | POA: Diagnosis present

## 2019-11-30 DIAGNOSIS — T402X1A Poisoning by other opioids, accidental (unintentional), initial encounter: Principal | ICD-10-CM | POA: Diagnosis present

## 2019-11-30 DIAGNOSIS — N179 Acute kidney failure, unspecified: Secondary | ICD-10-CM | POA: Diagnosis present

## 2019-11-30 DIAGNOSIS — R64 Cachexia: Secondary | ICD-10-CM | POA: Diagnosis present

## 2019-11-30 DIAGNOSIS — E871 Hypo-osmolality and hyponatremia: Secondary | ICD-10-CM | POA: Diagnosis not present

## 2019-11-30 DIAGNOSIS — A419 Sepsis, unspecified organism: Secondary | ICD-10-CM | POA: Diagnosis present

## 2019-11-30 DIAGNOSIS — Z515 Encounter for palliative care: Secondary | ICD-10-CM

## 2019-11-30 DIAGNOSIS — R6520 Severe sepsis without septic shock: Secondary | ICD-10-CM

## 2019-11-30 DIAGNOSIS — Y92234 Operating room of hospital as the place of occurrence of the external cause: Secondary | ICD-10-CM | POA: Diagnosis not present

## 2019-11-30 DIAGNOSIS — Z681 Body mass index (BMI) 19 or less, adult: Secondary | ICD-10-CM

## 2019-11-30 LAB — PROTIME-INR
INR: 1.7 — ABNORMAL HIGH (ref 0.8–1.2)
Prothrombin Time: 19 seconds — ABNORMAL HIGH (ref 11.4–15.2)

## 2019-11-30 LAB — POCT I-STAT 7, (LYTES, BLD GAS, ICA,H+H)
Acid-base deficit: 2 mmol/L (ref 0.0–2.0)
Bicarbonate: 21.5 mmol/L (ref 20.0–28.0)
Calcium, Ion: 1.05 mmol/L — ABNORMAL LOW (ref 1.15–1.40)
HCT: 30 % — ABNORMAL LOW (ref 36.0–46.0)
Hemoglobin: 10.2 g/dL — ABNORMAL LOW (ref 12.0–15.0)
O2 Saturation: 90 %
Potassium: 3.1 mmol/L — ABNORMAL LOW (ref 3.5–5.1)
Sodium: 134 mmol/L — ABNORMAL LOW (ref 135–145)
TCO2: 22 mmol/L (ref 22–32)
pCO2 arterial: 31.1 mmHg — ABNORMAL LOW (ref 32.0–48.0)
pH, Arterial: 7.448 (ref 7.350–7.450)
pO2, Arterial: 56 mmHg — ABNORMAL LOW (ref 83.0–108.0)

## 2019-11-30 LAB — CBC WITH DIFFERENTIAL/PLATELET
Abs Immature Granulocytes: 0.83 10*3/uL — ABNORMAL HIGH (ref 0.00–0.07)
Basophils Absolute: 0 10*3/uL (ref 0.0–0.1)
Basophils Relative: 0 %
Eosinophils Absolute: 0 10*3/uL (ref 0.0–0.5)
Eosinophils Relative: 0 %
HCT: 21.9 % — ABNORMAL LOW (ref 36.0–46.0)
Hemoglobin: 6.4 g/dL — CL (ref 12.0–15.0)
Immature Granulocytes: 5 %
Lymphocytes Relative: 10 %
Lymphs Abs: 1.6 10*3/uL (ref 0.7–4.0)
MCH: 24.5 pg — ABNORMAL LOW (ref 26.0–34.0)
MCHC: 29.2 g/dL — ABNORMAL LOW (ref 30.0–36.0)
MCV: 83.9 fL (ref 80.0–100.0)
Monocytes Absolute: 0.9 10*3/uL (ref 0.1–1.0)
Monocytes Relative: 6 %
Neutro Abs: 12.7 10*3/uL — ABNORMAL HIGH (ref 1.7–7.7)
Neutrophils Relative %: 79 %
Platelets: 47 10*3/uL — ABNORMAL LOW (ref 150–400)
RBC: 2.61 MIL/uL — ABNORMAL LOW (ref 3.87–5.11)
RDW: 17.6 % — ABNORMAL HIGH (ref 11.5–15.5)
WBC: 16 10*3/uL — ABNORMAL HIGH (ref 4.0–10.5)
nRBC: 0.9 % — ABNORMAL HIGH (ref 0.0–0.2)

## 2019-11-30 LAB — I-STAT VENOUS BLOOD GAS, ED
Acid-base deficit: 8 mmol/L — ABNORMAL HIGH (ref 0.0–2.0)
Bicarbonate: 16.2 mmol/L — ABNORMAL LOW (ref 20.0–28.0)
Calcium, Ion: 0.97 mmol/L — ABNORMAL LOW (ref 1.15–1.40)
HCT: 21 % — ABNORMAL LOW (ref 36.0–46.0)
Hemoglobin: 7.1 g/dL — ABNORMAL LOW (ref 12.0–15.0)
O2 Saturation: 99 %
Potassium: 3.2 mmol/L — ABNORMAL LOW (ref 3.5–5.1)
Sodium: 132 mmol/L — ABNORMAL LOW (ref 135–145)
TCO2: 17 mmol/L — ABNORMAL LOW (ref 22–32)
pCO2, Ven: 28.4 mmHg — ABNORMAL LOW (ref 44.0–60.0)
pH, Ven: 7.363 (ref 7.250–7.430)
pO2, Ven: 126 mmHg — ABNORMAL HIGH (ref 32.0–45.0)

## 2019-11-30 LAB — COMPREHENSIVE METABOLIC PANEL
ALT: 14 U/L (ref 0–44)
AST: 37 U/L (ref 15–41)
Albumin: 1.6 g/dL — ABNORMAL LOW (ref 3.5–5.0)
Alkaline Phosphatase: 94 U/L (ref 38–126)
Anion gap: 26 — ABNORMAL HIGH (ref 5–15)
BUN: 47 mg/dL — ABNORMAL HIGH (ref 6–20)
CO2: 14 mmol/L — ABNORMAL LOW (ref 22–32)
Calcium: 8.2 mg/dL — ABNORMAL LOW (ref 8.9–10.3)
Chloride: 93 mmol/L — ABNORMAL LOW (ref 98–111)
Creatinine, Ser: 1.39 mg/dL — ABNORMAL HIGH (ref 0.44–1.00)
GFR, Estimated: 53 mL/min — ABNORMAL LOW (ref >60)
Glucose, Bld: 130 mg/dL — ABNORMAL HIGH (ref 70–99)
Potassium: 3.2 mmol/L — ABNORMAL LOW (ref 3.5–5.1)
Sodium: 133 mmol/L — ABNORMAL LOW (ref 135–145)
Total Bilirubin: 2 mg/dL — ABNORMAL HIGH (ref 0.3–1.2)
Total Protein: 5.8 g/dL — ABNORMAL LOW (ref 6.5–8.1)

## 2019-11-30 LAB — ECHOCARDIOGRAM COMPLETE
Area-P 1/2: 3.27 cm2
Calc EF: 63.2 %
S' Lateral: 1.8 cm
Single Plane A2C EF: 65.7 %
Single Plane A4C EF: 56 %

## 2019-11-30 LAB — BRAIN NATRIURETIC PEPTIDE: B Natriuretic Peptide: 583 pg/mL — ABNORMAL HIGH (ref 0.0–100.0)

## 2019-11-30 LAB — TROPONIN I (HIGH SENSITIVITY): Troponin I (High Sensitivity): 8 ng/L (ref <18)

## 2019-11-30 LAB — I-STAT BETA HCG BLOOD, ED (MC, WL, AP ONLY): I-stat hCG, quantitative: <5 m[IU]/mL (ref <5)

## 2019-11-30 LAB — LACTIC ACID, PLASMA: Lactic Acid, Venous: >11 mmol/L (ref 0.5–1.9)

## 2019-11-30 LAB — RESPIRATORY PANEL BY RT PCR (FLU A&B, COVID)
Influenza A by PCR: NEGATIVE
Influenza B by PCR: NEGATIVE
SARS Coronavirus 2 by RT PCR: NEGATIVE

## 2019-11-30 LAB — APTT: aPTT: 34 seconds (ref 24–36)

## 2019-11-30 LAB — MRSA PCR SCREENING: MRSA by PCR: POSITIVE — AB

## 2019-11-30 IMAGING — DX DG CHEST 1V PORT
1 series · 1 of 1 positions shown · non-contrast
Comparison: [DATE]

CLINICAL DATA: Sepsis

EXAM:
PORTABLE CHEST 1 VIEW

[chest ap]
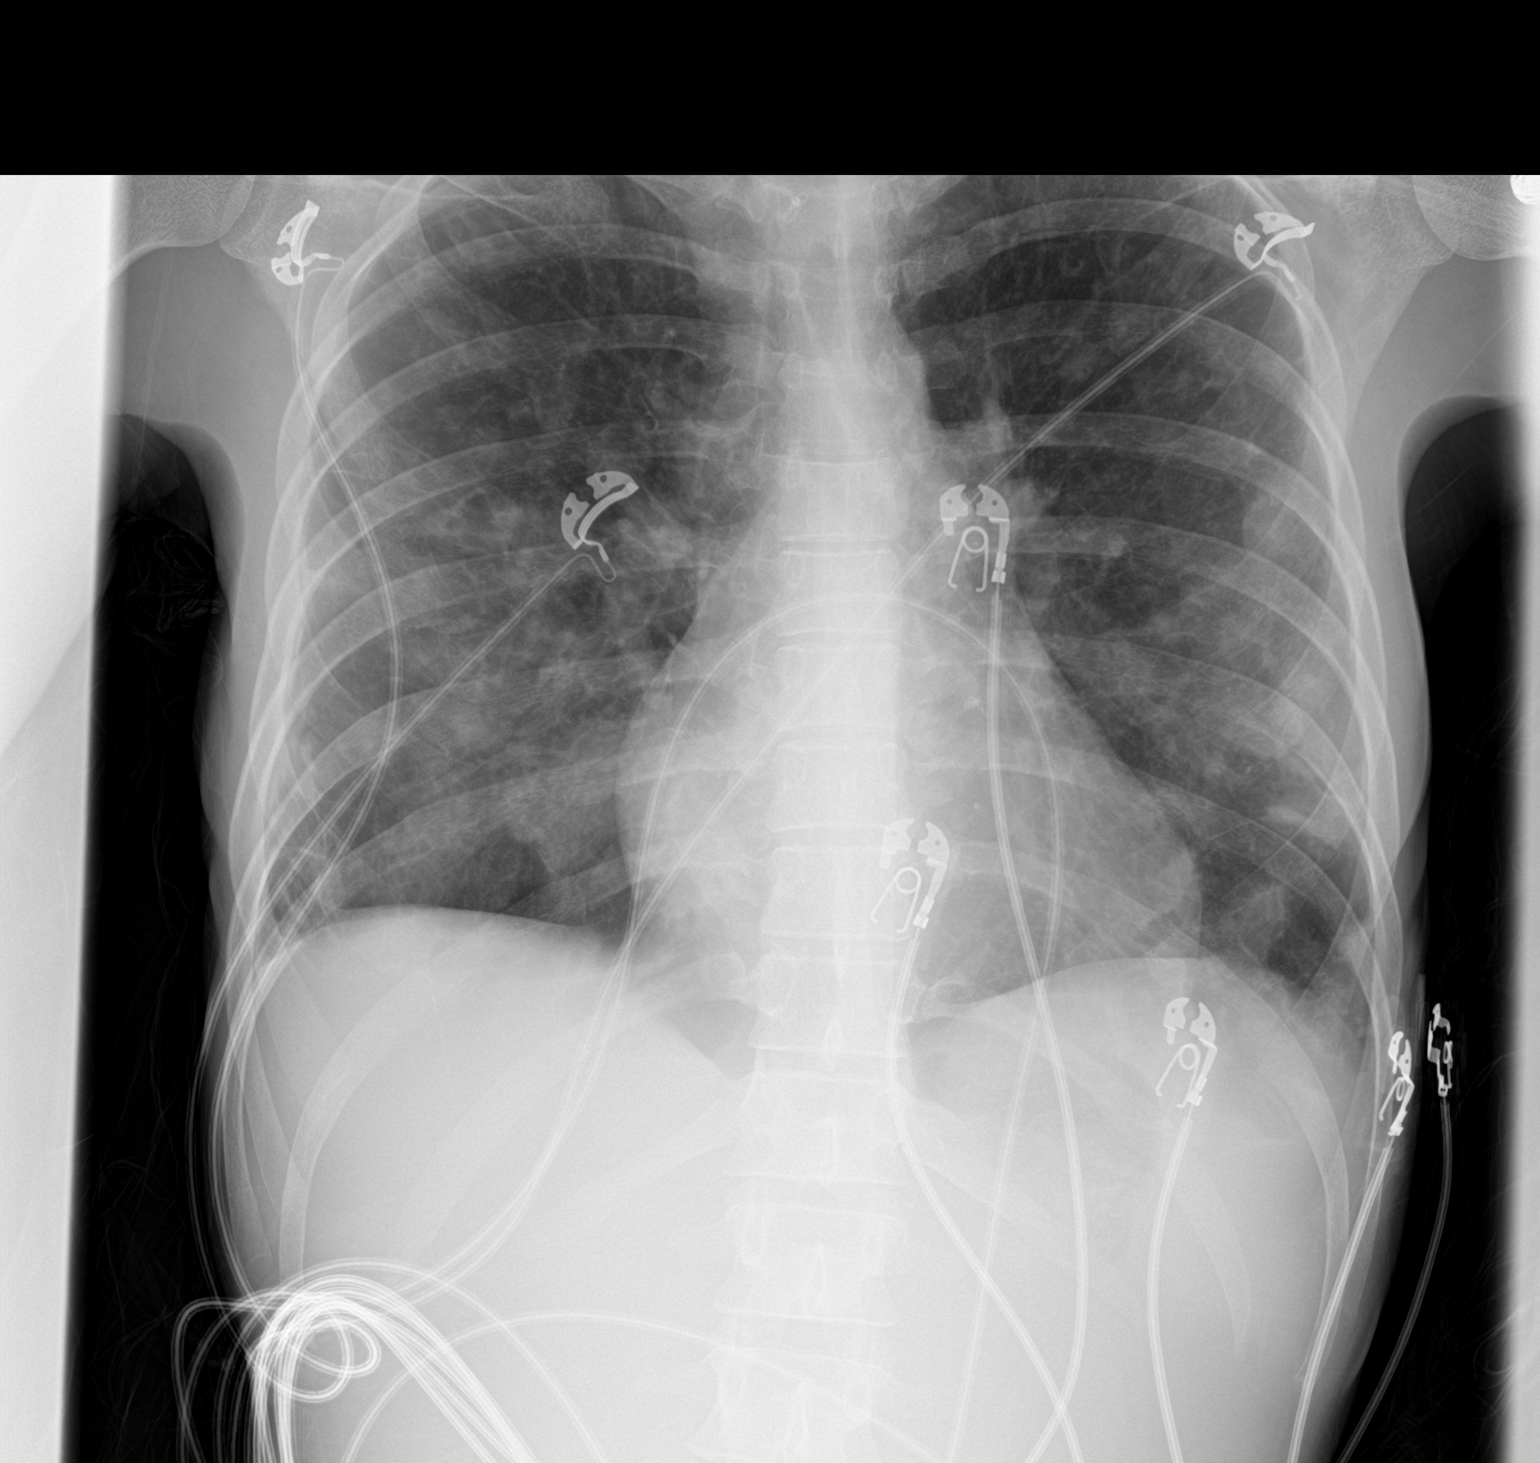

[1 of 1 positions shown; findings below may reference images not displayed]

FINDINGS: Persistent bilateral nodular opacities reflecting septic emboli seen
on prior chest CT. No pleural effusion or pneumothorax.
Cardiomediastinal contours are within normal limits.
IMPRESSION: Persistent bilateral nodular opacities reflecting septic emboli seen
on prior chest CT.

## 2019-11-30 MED ORDER — SODIUM CHLORIDE 0.9 % IV SOLN: INTRAVENOUS | Status: DC | PRN

## 2019-11-30 MED ORDER — DOCUSATE SODIUM 100 MG PO CAPS: 100.0000 mg | ORAL_CAPSULE | Freq: Two times a day (BID) | ORAL | Status: DC | PRN

## 2019-11-30 MED ORDER — NOREPINEPHRINE 4 MG/250ML-% IV SOLN
0.0000 ug/min | INTRAVENOUS | Status: DC
  Administered 2019-12-01: 10 ug/min via INTRAVENOUS
  Administered 2019-12-01: 8 ug/min via INTRAVENOUS
  Administered 2019-12-01: 4 ug/min via INTRAVENOUS
  Administered 2019-12-02: 5 ug/min via INTRAVENOUS
  Filled 2019-11-30 (×5): qty 250

## 2019-11-30 MED ORDER — HYDROMORPHONE HCL 1 MG/ML IJ SOLN
1.0000 mg | Freq: Once | INTRAMUSCULAR | Status: AC
  Administered 2019-11-30: 1 mg via INTRAVENOUS
  Filled 2019-11-30: qty 1

## 2019-11-30 MED ORDER — POLYETHYLENE GLYCOL 3350 17 G PO PACK: 17.0000 g | PACK | Freq: Every day | ORAL | Status: DC | PRN

## 2019-11-30 MED ORDER — LACTATED RINGERS IV BOLUS
1000.0000 mL | Freq: Once | INTRAVENOUS | Status: AC
  Administered 2019-11-30: 1000 mL via INTRAVENOUS

## 2019-11-30 MED ORDER — ENOXAPARIN SODIUM 40 MG/0.4ML ~~LOC~~ SOLN
40.0000 mg | SUBCUTANEOUS | Status: DC
  Administered 2019-11-30: 40 mg via SUBCUTANEOUS
  Filled 2019-11-30: qty 0.4

## 2019-11-30 MED ORDER — HYDROMORPHONE HCL 1 MG/ML IJ SOLN
0.5000 mg | INTRAMUSCULAR | Status: DC | PRN
  Administered 2019-11-30 – 2019-12-06 (×20): 1 mg via INTRAVENOUS
  Filled 2019-11-30 (×25): qty 1

## 2019-11-30 MED ORDER — ACETAMINOPHEN 325 MG PO TABS
650.0000 mg | ORAL_TABLET | ORAL | Status: DC | PRN
  Administered 2019-12-06 – 2020-01-05 (×7): 650 mg via ORAL
  Filled 2019-11-30 (×7): qty 2

## 2019-11-30 MED ORDER — LACTATED RINGERS IV BOLUS (SEPSIS)
1000.0000 mL | Freq: Once | INTRAVENOUS | Status: AC
  Administered 2019-11-30: 1000 mL via INTRAVENOUS

## 2019-11-30 MED ORDER — ONDANSETRON HCL 4 MG/2ML IJ SOLN
4.0000 mg | Freq: Four times a day (QID) | INTRAMUSCULAR | Status: DC | PRN
  Administered 2019-11-30 – 2020-01-01 (×10): 4 mg via INTRAVENOUS
  Filled 2019-11-30 (×11): qty 2

## 2019-11-30 MED ORDER — HEPARIN BOLUS VIA INFUSION
3000.0000 [IU] | Freq: Once | INTRAVENOUS | Status: AC
  Administered 2019-11-30: 3000 [IU] via INTRAVENOUS
  Filled 2019-11-30: qty 3000

## 2019-11-30 MED ORDER — SODIUM CHLORIDE 0.9 % IV SOLN
250.0000 mL | INTRAVENOUS | Status: DC
  Administered 2019-11-30 – 2019-12-04 (×2): 250 mL via INTRAVENOUS

## 2019-11-30 MED ORDER — POTASSIUM CHLORIDE 10 MEQ/100ML IV SOLN: 10.0000 meq | INTRAVENOUS | Status: DC

## 2019-11-30 MED ORDER — VANCOMYCIN HCL 500 MG/100ML IV SOLN
500.0000 mg | INTRAVENOUS | Status: DC
  Filled 2019-11-30: qty 100

## 2019-11-30 MED ORDER — PHENYLEPHRINE HCL-NACL 10-0.9 MG/250ML-% IV SOLN
25.0000 ug/min | INTRAVENOUS | Status: DC
  Administered 2019-11-30: 40 ug/min via INTRAVENOUS
  Administered 2019-11-30: 80 ug/min via INTRAVENOUS
  Administered 2019-12-01: 50 ug/min via INTRAVENOUS
  Administered 2019-12-01: 30 ug/min via INTRAVENOUS
  Administered 2019-12-01: 35 ug/min via INTRAVENOUS
  Administered 2019-12-02: 20 ug/min via INTRAVENOUS
  Filled 2019-11-30 (×2): qty 250
  Filled 2019-11-30: qty 500
  Filled 2019-11-30 (×2): qty 250
  Filled 2019-11-30: qty 500
  Filled 2019-11-30: qty 250

## 2019-11-30 MED ORDER — CALCIUM GLUCONATE-NACL 1-0.675 GM/50ML-% IV SOLN
1.0000 g | Freq: Once | INTRAVENOUS | Status: AC
  Administered 2019-12-01: 1000 mg via INTRAVENOUS
  Filled 2019-11-30 (×2): qty 50

## 2019-11-30 MED ORDER — SODIUM CHLORIDE 0.9% IV SOLUTION: Freq: Once | INTRAVENOUS | Status: AC

## 2019-11-30 MED ORDER — PIPERACILLIN-TAZOBACTAM 3.375 G IVPB
3.3750 g | Freq: Three times a day (TID) | INTRAVENOUS | Status: DC
  Administered 2019-11-30 – 2019-12-02 (×5): 3.375 g via INTRAVENOUS
  Filled 2019-11-30 (×5): qty 50

## 2019-11-30 MED ORDER — LACTATED RINGERS IV BOLUS (SEPSIS)
500.0000 mL | Freq: Once | INTRAVENOUS | Status: AC
  Administered 2019-11-30: 500 mL via INTRAVENOUS

## 2019-11-30 MED ORDER — VANCOMYCIN HCL IN DEXTROSE 1-5 GM/200ML-% IV SOLN
1000.0000 mg | Freq: Once | INTRAVENOUS | Status: AC
  Administered 2019-11-30: 1000 mg via INTRAVENOUS
  Filled 2019-11-30: qty 200

## 2019-11-30 MED ORDER — PIPERACILLIN-TAZOBACTAM 3.375 G IVPB 30 MIN
3.3750 g | Freq: Once | INTRAVENOUS | Status: AC
  Administered 2019-11-30: 3.375 g via INTRAVENOUS
  Filled 2019-11-30: qty 50

## 2019-11-30 MED ORDER — HEPARIN (PORCINE) 25000 UT/250ML-% IV SOLN
900.0000 [IU]/h | INTRAVENOUS | Status: DC
  Administered 2019-11-30: 900 [IU]/h via INTRAVENOUS
  Filled 2019-11-30: qty 250

## 2019-11-30 MED ORDER — POTASSIUM CHLORIDE 10 MEQ/100ML IV SOLN
10.0000 meq | INTRAVENOUS | Status: AC
  Administered 2019-11-30 – 2019-12-01 (×4): 10 meq via INTRAVENOUS
  Filled 2019-11-30 (×4): qty 100

## 2019-11-30 MED ORDER — LACTATED RINGERS IV SOLN: INTRAVENOUS | Status: AC

## 2019-11-30 NOTE — ED Provider Notes (Signed)
[facility] EMERGENCY DEPARTMENT Provider Note   CSN: [id number] Arrival date & time: 11/30/19  1035     History Chief Complaint  Patient presents with  . Drug Overdose    Amber [name] is a 29 y.o. 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  The patient's mother is present at the bedside.  She reports that the patient has been using IV heroin and fentanyl on a daily basis since leaving the hospital AMA at the end of October.  At that time she was being treated for MRSA bacteremia complicated by endocarditis and septic emboli.  She has not been taking any kind of medications at home.  Her mother reports she received a call from the patient's colleague her friend, who was concerned the patient cannot even take a few steps today because she felt too weak and short of breath.  The patient is very poor historian.  She does tell me that she injects drugs bilaterally into her arms, most recently earlier today.  She says she feels extremely short of breath.  She feels lightheaded and fatigued and nauseous.  In private, her mother tells me that this is the third time the patient has been through the same situation of the hospital.  Mother reports that she has contacted the police, as the patient has an outstanding warrant.  She states the police told her that if the patient attempts to leave AMA, the police will be arresting her.   HPI     Past Medical History:  Diagnosis Date  . Heroin abuse (HCC)   . Kidney stones   . MRSA (methicillin resistant staph aureus) culture positive   . Ovarian cyst   . UTI (lower urinary tract infection)     Patient Active Problem List   Diagnosis Date Noted  . Septic shock (HCC) 11/30/2019  . Septic embolism (HCC) 11/05/2019  . AKI (acute kidney injury) (HCC)  11/05/2019  . Hyponatremia 11/05/2019  . Cellulitis of left foot 11/05/2019  . 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 Bacterial endocarditis 11/05/2019  . Acute bacterial endocarditis   . MRSA bacteremia   . Sepsis (HCC) 11/03/2019  . Abscess of face 08/20/2018  . Facial cellulitis 11/21/2017  . Depression   . IV drug abuse (HCC)   . Suicidal ideation     Past Surgical History:  Procedure Laterality Date  . FINGER SURGERY    . TEE WITHOUT CARDIOVERSION N/A 11/07/2019   Procedure: TRANSESOPHAGEAL ECHOCARDIOGRAM (TEE);  Surgeon: Wendall Stade, MD;  Location: [facility] ENDOSCOPY;  Service: Cardiovascular;  Laterality: N/A;     OB History   No obstetric history on file.     Family History  Problem Relation Age of Onset  . Valvular heart disease Maternal Grandmother     Social History   Tobacco Use  . Smoking status: Former Smoker    Types: Cigarettes  . Smokeless tobacco: Never Used  Vaping Use  . Vaping Use: Never used  Substance Use Topics  . Alcohol use: No  . Drug use: Yes    Types: Marijuana, IV    Comment: opiods- pain medication/heroin    Home Medications Prior to Admission medications   Medication Sig Start Date End Date Taking? Authorizing Provider  acetaminophen (TYLENOL) 500 MG tablet Take 1,000 mg by mouth every 6 (six) hours as  needed for headache.    Yes [provider]  ibuprofen (ADVIL) 600 MG tablet Take 1 tablet (600 mg total) by mouth every 6 (six) hours as needed. Patient not taking: Reported on 11/03/2019 08/20/18   Fayrene Helperran, Bowie, PA-C  ondansetron (ZOFRAN ODT) 4 MG disintegrating tablet Take 1 tablet (4 mg total) by mouth every 8 (eight) hours as needed for nausea or vomiting. Patient not taking: Reported on 11/03/2019 01/11/19   Liberty HandyGibbons, Claudia J, PA-C    Allergies    Bee venom  Review of Systems   Review of Systems  Constitutional: Positive for appetite change, fatigue and fever.  Respiratory: Positive for cough and shortness of  breath.   Cardiovascular: Positive for chest pain. Negative for palpitations.  Gastrointestinal: Positive for nausea. Negative for vomiting.  Genitourinary: Negative for dysuria and hematuria.  Musculoskeletal: Negative for arthralgias and back pain.  Skin: Positive for rash and wound.  Neurological: Positive for light-headedness and headaches. Negative for syncope.  All other systems reviewed and are negative.   Physical Exam Updated Vital Signs BP (!) 91/54   Pulse 100   Temp 97.6 F (36.4 C) (Oral)   Resp (!) 32   SpO2 (!) 79%   Physical Exam Vitals and nursing note reviewed.  Constitutional:      Appearance: She is well-developed.     Comments: Thin, cachectic  HENT:     Head: Normocephalic and atraumatic.  Eyes:     Pupils: Pupils are equal, round, and reactive to light.  Cardiovascular:     Rate and Rhythm: Normal rate and regular rhythm.     Pulses: Normal pulses.  Pulmonary:     Effort: Pulmonary effort is normal. No respiratory distress.     Comments: RR 26 Abdominal:     Palpations: Abdomen is soft.     Tenderness: There is no abdominal tenderness.  Musculoskeletal:     Cervical back: Neck supple.  Skin:    General: Skin is warm and dry.     Comments: Injection marks on bilateral arms and hands,no surrounding erythema  Neurological:     Mental Status: She is alert.     ED Results / Procedures / Treatments   Labs (all labs ordered are listed, but only abnormal results are displayed) Labs Reviewed  LACTIC ACID, PLASMA - Abnormal; Notable for the following components:      Result Value   Lactic Acid, Venous >11.0 (*)    All other components within normal limits  COMPREHENSIVE METABOLIC PANEL - Abnormal; Notable for the following components:   Sodium 133 (*)    Potassium 3.2 (*)    Chloride 93 (*)    CO2 14 (*)    Glucose, Bld 130 (*)    BUN 47 (*)    Creatinine, Ser 1.39 (*)    Calcium 8.2 (*)    Total Protein 5.8 (*)    Albumin 1.6 (*)     Total Bilirubin 2.0 (*)    GFR, Estimated 53 (*)    Anion gap 26 (*)    All other components within normal limits  CBC WITH DIFFERENTIAL/PLATELET - Abnormal; Notable for the following components:   WBC 16.0 (*)    RBC 2.61 (*)    Hemoglobin 6.4 (*)    HCT 21.9 (*)    MCH 24.5 (*)    MCHC 29.2 (*)    RDW 17.6 (*)    Platelets 47 (*)    nRBC 0.9 (*)    Neutro Abs  12.7 (*)    Abs Immature Granulocytes 0.83 (*)    All other components within normal limits  PROTIME-INR - Abnormal; Notable for the following components:   Prothrombin Time 19.0 (*)    INR 1.7 (*)    All other components within normal limits  I-STAT VENOUS BLOOD GAS, ED - Abnormal; Notable for the following components:   pCO2, Ven 28.4 (*)    pO2, Ven 126.0 (*)    Bicarbonate 16.2 (*)    TCO2 17 (*)    Acid-base deficit 8.0 (*)    Sodium 132 (*)    Potassium 3.2 (*)    Calcium, Ion 0.97 (*)    HCT 21.0 (*)    Hemoglobin 7.1 (*)    All other components within normal limits  RESPIRATORY PANEL BY RT PCR (FLU A&B, COVID)  CULTURE, BLOOD (ROUTINE X 2)  CULTURE, BLOOD (ROUTINE X 2)  URINE CULTURE  MRSA PCR SCREENING  APTT  LACTIC ACID, PLASMA  URINALYSIS, ROUTINE W REFLEX MICROSCOPIC  BRAIN NATRIURETIC PEPTIDE  CBC  MAGNESIUM  BASIC METABOLIC PANEL  PHOSPHORUS  I-STAT BETA HCG BLOOD, ED ([facility], [facility], [facility] ONLY)  TYPE AND SCREEN  PREPARE RBC (CROSSMATCH)  TROPONIN I (HIGH SENSITIVITY)  TROPONIN I (HIGH SENSITIVITY)    EKG EKG Interpretation  Date/Time:  Wednesday November 30 2019 10:58:30 EST Ventricular Rate:  111 PR Interval:    QRS Duration: 100 QT Interval:  369 QTC Calculation: 502 R Axis:   90 Text Interpretation: Sinus tachycardia Borderline right axis deviation Nonspecific repol abnormality, diffuse leads Prolonged QT interval No sig change from prior ecg Confirmed by Alvester Chou ([id number]) on 11/30/2019 12:50:07 PM   Radiology DG Chest Port 1 View  Result Date: 11/30/2019 CLINICAL DATA:   Sepsis EXAM: PORTABLE CHEST 1 VIEW COMPARISON:  11/05/2019 FINDINGS: Persistent bilateral nodular opacities reflecting septic emboli seen on prior chest CT. No pleural effusion or pneumothorax. Cardiomediastinal contours are within normal limits. IMPRESSION: Persistent bilateral nodular opacities reflecting septic emboli seen on prior chest CT. Electronically Signed   By: Guadlupe Spanish M.D.   On: 11/30/2019 11:30   ECHOCARDIOGRAM COMPLETE  Result Date: 11/30/2019    ECHOCARDIOGRAM REPORT   Patient Name:   YASHEKA FOSSETT Date of Exam: 11/30/2019 Medical Rec #:  [id number]        Height:       62.0 in Accession #:    [id number]       Weight:       108.7 lb Date of Birth:  11-Nov-1990         BSA:          1.475 m Patient Age:    29 years         BP:           78/52 mmHg Patient Gender: F                HR:           100 bpm. Exam Location:  Inpatient Procedure: 2D Echo, 3D Echo, Cardiac Doppler and Color Doppler STAT ECHO Indications:    Endocarditis  History:        Patient has prior history of Echocardiogram examinations, most                 recent 11/07/2019. Endocarditis; Signs/Symptoms:Bacteremia.                 Tricuspid vegetations. IVDU. MRSA. Sepsis.  Sonographer:    Sheralyn Boatman  RDCS Referring Phys: [id number] RAKESH V ALVA  Sonographer Comments: Technically difficult study due to poor echo windows. Patient moaning throughout exam. IMPRESSIONS  1. 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 There is lack of coaptation and severe regurgitation. Largest dimensions through study 2.36 cm X 1.24cm. The tricuspid valve is abnormal. Tricuspid valve regurgitation is severe.  2. Left ventricular ejection fraction, by estimation, is 65 to 70%. Left ventricular ejection fraction by PLAX is 70 %. The left ventricle has normal function. The left ventricle has no regional wall motion  abnormalities. Left ventricular diastolic parameters were normal.  3. Right ventricular systolic function is hyperdynamic. The right ventricular size is normal. There is normal pulmonary artery systolic pressure.  4. The mitral valve is grossly normal. No evidence of mitral valve regurgitation.  5. The aortic valve is grossly normal. Aortic valve regurgitation is not visualized.  6. The inferior vena cava is dilated in size with <50% respiratory variability, suggesting right atrial pressure of 15 mmHg. Comparison(s): No prior Echocardiogram. Conclusion(s)/Recommendation(s): Evidence of infective endocarditis; primary MD notified. FINDINGS  Left Ventricle: Left ventricular ejection fraction, by estimation, is 65 to 70%. Left ventricular ejection fraction by PLAX is 70 %. The left ventricle has normal function. The left ventricle has no regional wall motion abnormalities. The left ventricular internal cavity size was small. There is no left ventricular hypertrophy. Left ventricular diastolic parameters were normal. Right Ventricle: The right ventricular size is normal. No increase in right ventricular wall thickness. Right ventricular systolic function is hyperdynamic. There is normal pulmonary artery systolic pressure. The tricuspid regurgitant velocity is 2.50 m/s, and with an assumed right atrial pressure of 8 mmHg, the estimated right ventricular systolic pressure is 33.0 mmHg. Left Atrium: Left atrial size was normal in size. Right Atrium: Right atrial size was normal in size. Pericardium: Trivial pericardial effusion is present. Mitral Valve: The mitral valve is grossly normal. No evidence of mitral valve regurgitation. Tricuspid Valve: Large, greater than 1 cm, vegetation on the tricuspid valve; appears to predominante on the septal and posterior leaflets, with a slightly smaller anterior leafelt vegetation. There is lack of coaptation and severe regurgitation. Largest  dimensions through study 2.36 cm X  1.24cm. The tricuspid valve is abnormal. Tricuspid valve regurgitation is severe. Aortic Valve: The aortic valve is grossly normal. Aortic valve regurgitation is not visualized. Pulmonic Valve: The pulmonic valve was not well visualized. Pulmonic valve regurgitation is not visualized. Aorta: The aortic root and ascending aorta are structurally normal, with no evidence of dilitation. Venous: The inferior vena cava is dilated in size with less than 50% respiratory variability, suggesting right atrial pressure of 15 mmHg. IAS/Shunts: The atrial septum is grossly normal.  LEFT VENTRICLE PLAX 2D LV EF:         Left            Diastology                ventricular     LV e' medial:    12.90 cm/s                ejection        LV E/e' medial:  3.8                fraction by     LV e' lateral:   14.90 cm/s  PLAX is 70      LV E/e' lateral: 3.3                %. LVIDd:         2.90 cm LVIDs:         1.80 cm LV PW:         1.00 cm LV IVS:        0.80 cm LVOT diam:     1.80 cm LVOT Area:     2.54 cm  LV Volumes (MOD) LV vol d, MOD    42.9 ml A2C: LV vol d, MOD    28.2 ml A4C: LV vol s, MOD    14.7 ml A2C: LV vol s, MOD    12.4 ml A4C: LV SV MOD A2C:   28.2 ml LV SV MOD A4C:   28.2 ml LV SV MOD BP:    23.6 ml RIGHT VENTRICLE         IVC TAPSE (M-mode): 2.5 cm  IVC diam: 2.30 cm LEFT ATRIUM             Index       RIGHT ATRIUM           Index LA diam:        2.70 cm 1.83 cm/m  RA Area:     20.10 cm LA Vol (A2C):   14.8 ml 10.03 ml/m RA Volume:   65.10 ml  44.13 ml/m LA Vol (A4C):   10.1 ml 6.85 ml/m LA Biplane Vol: 13.5 ml 9.15 ml/m   AORTA Ao Root diam: 2.70 cm Ao Asc diam:  2.10 cm MITRAL VALVE               TRICUSPID VALVE MV Area (PHT): 3.27 cm    TR Peak grad:   25.0 mmHg MV Decel Time: 232 msec    TR Vmax:        250.00 cm/s MV E velocity: 49.50 cm/s MV A velocity: 58.30 cm/s  SHUNTS MV E/A ratio:  0.85        Systemic Diam: 1.80 cm Riley Lam MD Electronically signed by Riley Lam  MD Signature Date/Time: 11/30/2019/4:00:21 PM    Final     Procedures .Critical Care Performed by: Terald Sleeper, MD Authorized by: Terald Sleeper, MD   Critical care provider statement:    Critical care time (minutes):  65   Critical care was necessary to treat or prevent imminent or life-threatening deterioration of the following conditions:  Sepsis   Critical care was time spent personally by me on the following activities:  Discussions with consultants, evaluation of patient's response to treatment, examination of patient, ordering and performing treatments and interventions, ordering and review of laboratory studies, ordering and review of radiographic studies, pulse oximetry, re-evaluation of patient's condition, obtaining history from patient or surrogate and review of old charts   (including critical care time)  Medications Ordered in ED Medications  lactated ringers infusion ( Intravenous New Bag/Given 11/30/19 1739)  docusate sodium (COLACE) capsule 100 mg (has no administration in time range)  polyethylene glycol (MIRALAX / GLYCOLAX) packet 17 g (has no administration in time range)  enoxaparin (LOVENOX) injection 40 mg (has no administration in time range)  ondansetron (ZOFRAN) injection 4 mg (4 mg Intravenous Given 11/30/19 1739)  acetaminophen (TYLENOL) tablet 650 mg (has no administration in time range)  0.9 %  sodium chloride infusion (250 mLs Intravenous New Bag/Given 11/30/19 1749)  phenylephrine (NEOSYNEPHRINE) 10-0.9  MG/250ML-% infusion (40 mcg/min Intravenous New Bag/Given 11/30/19 1741)  vancomycin (VANCOREADY) IVPB 500 mg/100 mL (has no administration in time range)  piperacillin-tazobactam (ZOSYN) IVPB 3.375 g (3.375 g Intravenous New Bag/Given 11/30/19 1749)  lactated ringers bolus 1,000 mL (0 mLs Intravenous Stopped 11/30/19 1306)    And  lactated ringers bolus 500 mL (0 mLs Intravenous Stopped 11/30/19 1413)  piperacillin-tazobactam (ZOSYN) IVPB 3.375 g  (0 g Intravenous Stopped 11/30/19 1306)  vancomycin (VANCOCIN) IVPB 1000 mg/200 mL premix (0 mg Intravenous Stopped 11/30/19 1337)  heparin bolus via infusion 3,000 Units (3,000 Units Intravenous Bolus from Bag 11/30/19 1231)  HYDROmorphone (DILAUDID) injection 1 mg (1 mg Intravenous Given 11/30/19 1739)  lactated ringers bolus 1,000 mL (0 mLs Intravenous Stopped 11/30/19 1622)  0.9 %  sodium chloride infusion (Manually program via Guardrails IV Fluids) ( Intravenous New Bag/Given 11/30/19 1535)    ED Course  I have reviewed the triage vital signs and the nursing notes.  Pertinent labs & imaging results that were available during my care of the patient were reviewed by me and considered in my medical decision making (see chart for details).  This patient complains of SOB, fatigue, weakness  This involves an extensive number of treatment options, and is a complaint that carries with it a high risk of complications and morbidity.  The differential diagnosis includes recurrent or worsening septic emboli vs CHF vs MRSA bacteremia/sepsis vs anemia vs other   I ordered, reviewed, and interpreted labs, which included CMP (anion gap 26, Cr 1.39, Alb 1.6, K 3.2).  CBC w/ hgb 6.4, WBC 16.0, Lactate > 11.0, VBG pH 7.36, Covid/Flu negative, preg negative,  I ordered medication IV vancomycin, IV zosyn for sepsis empiric coverage (including MRSA), IV fluid bolus 30 cc/kg per sepsis protocol with LR, IV heparin for known septic emboli in setting of her persistent hypoxia, and IV dilaudid for opioid withdrawal syndrome. I ordered imaging studies which included dg chest I independently visualized and interpreted imaging which showed bilateral opacities and the monitor tracing which showed sinus rhythm Additional history was obtained from patient's mother a bedside Previous records obtained and reviewed showing recent hospitalization course, echocardiogram w/ vegetations, CT PE study w/ septic emboli I consulted  Critical Care team and discussed lab and imaging findings  After the interventions stated above, I reevaluated the patient and found that she remained hypotensive but stable after her IV fluids, with MAP > 65 mmhg, not requiring pressors in the moment but at high risk for further hemodynamic decompensation.  She was admitted to the ICU.  She had a notable lactic acidosis likely in the setting of her anemia, her sepsis and pulmonary emboli.    Clinical Course as of Nov 29 1748  Wed Nov 30, 2019  1226 Extremely difficult IV access.  IV team placed very small peripheral IV in left forearm but was not able to obtain blood.  I placed an USIV placed in right arm and was able to draw enough blood for basic labs and 1 set of cultures.  The patient is refusing central line at this time, but we now have two peripheral IV's to provide infusions through, and she is not needing vasopressors.   [MT]  1228 Additionally we have a source organism from her October blood cultures (MRSA) as the likely cause of her bacteremia.   [MT]  1322 SBP 86, MAP 66 on reassessment, still receiving IV fluids and mentating well.  Critical care paged.  Anemia is noted with hgb  6.4, but in the setting of hypoxia and known septic emboli I feel that anticoagulation with heparin is the priority at this time.  I will discuss simultaneous pRBC transfusion with Crit Care   [MT]  1326 I spoke to Rutherford Guys from Critical Care who will come evaluate patient.   [MT]  1328 Lactic Acid, Venous(!!): >11.0 [MT]    Clinical Course User Index [MT] Thetis Schwimmer, Kermit Balo, MD    Final Clinical Impression(s) / ED Diagnoses Final diagnoses:  Sepsis with acute organ dysfunction, due to unspecified organism, unspecified type, unspecified whether septic shock present (HCC)  Acute respiratory failure with hypoxia (HCC)  Septic embolism (HCC)  Anemia, unspecified type  Opioid use with withdrawal [facility])    Rx / DC Orders ED Discharge Orders    None        Brodi Kari, Kermit Balo, MD 11/30/19 1753

## 2019-11-30 NOTE — ED Triage Notes (Signed)
Pts mother who is at bedside advised that patient has been treated recently for MRSA infection, pt was supposed to have in patient treatment for 6 weeks but left AMA after 2 days, states pt was on the way back to hospital today, she stopped and did heroin and fentanyl in route. Per ED staff in lobby. Pt had to be removed from her vehicle due to lethargy, heroin needles were present on patient at that time. A/ox4.

## 2019-11-30 NOTE — ED Notes (Signed)
IV team at bedside 

## 2019-11-30 NOTE — ED Notes (Signed)
Attempted to site iv without success, taylor s RN also attempted to site iv without success. IV team paged, Dr. Renaye Rakers made aware

## 2019-11-30 NOTE — ED Notes (Addendum)
Patient placed on 4L O2 via n.c.

## 2019-11-30 NOTE — H&P (Signed)
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  Seen in consultation by cardiology, cardiothoracic surgery and infectious disease.  Not felt to require surgery/also not a candidate given ongoing IVDU.  She signed out AMA.  Has been living with her best friend since. Mom reports ongoing IVDU.  She had to be removed from her vehicle due to lethargy, needles noted on her arm by triage nurse.  She reports progressive weakness and pain in her sides.  Hypotensive in the ED, received 2 L of fluids and empiric Zosyn/vancomycin.  Past Medical History  IVDU  Significant Hospital Events   10/23-26 hospital admit for endocarditis, signed out AMA  Consults:    Procedures:    Significant Diagnostic Tests:  Echo 11/17 >>  MR lumbar/thoracic spine 10/22 >> no evidence of discitis or osteomyelitis CT angio chest 10/21 >> numerous nodules with some cavitation, consistent with septic emboli CT foot left/CT right ankle >> evidence of cellulitis, no osteomyelitis  Micro Data:  Blood 11/17 >>  Antimicrobials:  Zosyn 11/17 >> Vanco 11/17 >>  Interim history/subjective:    Objective   Blood pressure (!) 84/55, pulse (!) 105, temperature (!) 96.3 F (35.7 C), temperature source Axillary, resp. rate (!) 30, SpO2 100 %.       No intake or output data in the 24 hours ending 11/30/19 1418 There were no vitals filed for this visit.  Examination: General: Cachectic young woman appears much older than stated age HENT: Hair matted, colored,  pallor+, no icterus Lungs: Clear breath sounds bilateral, no accessory muscle use Cardiovascular: S1-S2 tacky, S3 gallop Abdomen: Soft, diffuse tenderness, no guarding or rigidity Extremities: Track marks in both hands and arms, cool digits and toes, with socks and warmer Neuro: Lethargic, alert when aroused, no meningeal signs, grossly nonfocal  Chest x-ray independently reviewed which shows bilateral nodular opacities as seen on prior CT  Resolved Hospital Problem list     Assessment & Plan:  Known right-sided infective endocarditis was signed out AMA and presented 3 weeks later with septic shock  Septic shock -has received 2 L of fluid, will give 1 more liter. Transfuse 1 unit of PRBC due to anemia Start peripheral Neo-Synephrine , will place CVL if high-dose pressors required Repeat lactate for clearance Empiric Zosyn and vancomycin  Tricuspid valve endocarditis -repeat echo, previous tricuspid vegetation was 1 cm Not a candidate for surgery Concern now whether MRSA has seeded hardware in her RLE No evidence of discitis/osteomyelitis in her back and foot on prior admission but if she has recurrent symptoms in these areas may need to reimage  AKI -likely prerenal vs component of ATN Avoid nephrotoxins Monitor electrolytes, replete hypokalemia  Anemia, normocytic -on last admission hemoglobin was 12 and dropped down to 7.7 on discharge, now 6.4 Will transfuse 1 unit PRBC and monitor No evidence of blood loss currently Heparin started by ED, will discontinue since no indication for septic emboli.  Severe thrombocytopenia -likely due to MRSA sepsis  Heroin addiction -watch for withdrawal -Can consider low-dose methadone  Best practice:  Diet: Full liquids Pain/Anxiety/Delirium protocol (if indicated): N/A VAP protocol (if indicated): N/A DVT prophylaxis: Lovenox GI prophylaxis: N/A Glucose control: CBGs Mobility: Bedrest Code Status: Full Family Communication: Mom at  bedside Disposition: ICU  Labs   CBC: Recent Labs  Lab 11/30/19 1226 11/30/19 1252  WBC 16.0*  --   NEUTROABS 12.7*  --   HGB 6.4* 7.1*  HCT 21.9* 21.0*  MCV 83.9  --   PLT 47*  --     Basic Metabolic Panel: Recent Labs  Lab 11/30/19 1226 11/30/19 1252  NA 133* 132*  K 3.2* 3.2*  CL 93*  --   CO2 14*  --   GLUCOSE 130*  --   BUN 47*  --   CREATININE 1.39*  --   CALCIUM 8.2*  --    GFR: CrCl cannot be calculated (Unknown ideal weight.). Recent Labs  Lab 11/30/19 1226 11/30/19 1228  WBC 16.0*  --   LATICACIDVEN  --  >11.0*    Liver Function Tests: Recent Labs  Lab 11/30/19 1226  AST 37  ALT 14  ALKPHOS 94  BILITOT 2.0*  PROT 5.8*  ALBUMIN 1.6*   No results for input(s): LIPASE, AMYLASE in the last 168 hours. No results for input(s): AMMONIA in the last 168 hours.  ABG    Component Value Date/Time   HCO3 16.2 (L) 11/30/2019 1252   TCO2 17 (L) 11/30/2019 1252   ACIDBASEDEF 8.0 (H) 11/30/2019 1252   O2SAT 99.0 11/30/2019 1252     Coagulation Profile: Recent Labs  Lab 11/30/19 1226  INR 1.7*    Cardiac Enzymes: No results for input(s): CKTOTAL, CKMB, CKMBINDEX, TROPONINI in the last 168 hours.  HbA1C: Hgb A1c MFr Bld  Date/Time Value Ref Range Status  11/04/2019 04:34 AM 6.3 (H) 4.8 - 5.6 % Final    Comment:    (NOTE)         Prediabetes: 5.7 - 6.4         Diabetes: >6.4         Glycemic control for adults with diabetes: <7.0     CBG: No results for input(s): GLUCAP in the last 168 hours.  Review of Systems:   Head hurts Whole body hurts Lost weight Generalized weakness Dizziness No nausea/vomiting/diarrhea No hematemesis/melena   Past Medical History  She,  has a past medical history of Heroin abuse (HCC), Kidney stones, MRSA (methicillin resistant staph aureus) culture positive, Ovarian cyst, and UTI (lower urinary tract infection).   Surgical History    Past Surgical History:  Procedure Laterality Date  . FINGER  SURGERY    . TEE WITHOUT CARDIOVERSION N/A 11/07/2019   Procedure: TRANSESOPHAGEAL ECHOCARDIOGRAM (TEE);  Surgeon: Wendall Stade, MD;  Location: [facility] ENDOSCOPY;  Service: Cardiovascular;  Laterality: N/A;     Social History   reports that she has quit smoking. Her smoking use included cigarettes. She has never used smokeless tobacco. She reports current drug use. Drugs: Marijuana and IV. She reports that she does not drink alcohol.   Family History   Her family history includes Valvular heart disease in her maternal grandmother.   Allergies Allergies  Allergen Reactions  . Bee Venom Anaphylaxis     Home Medications  Prior to Admission medications   Medication Sig Start Date End Date Taking? Authorizing Provider  acetaminophen (TYLENOL) 500 MG tablet Take 1,000 mg by mouth every 6 (six) hours as needed for headache.    Yes [provider]  ibuprofen (ADVIL) 600 MG  tablet Take 1 tablet (600 mg total) by mouth every 6 (six) hours as needed. Patient not taking: Reported on 11/03/2019 08/20/18   Fayrene Helper, PA-C  ondansetron (ZOFRAN ODT) 4 MG disintegrating tablet Take 1 tablet (4 mg total) by mouth every 8 (eight) hours as needed for nausea or vomiting. Patient not taking: Reported on 11/03/2019 01/11/19   Liberty Handy, PA-C     Critical care time: 68m     Cyril Mourning MD. [facility]. [place] [facility] See Amion for pager  If no response to pager , please call [phone]  After 7:00 pm call Elink  [phone]   11/30/2019

## 2019-11-30 NOTE — Progress Notes (Addendum)
eLink Physician-Brief Progress Note Patient Name: Amber [name] DOB: 04-18-1990 MRN: [id number]   Date of Service  11/30/2019  HPI/Events of Note  BP 87/49, MAP 58 mmHg, last reported lactate > 11, K+ 3.1, Ca+ 1.05  eICU Interventions  LR  1000 ml iv bolus, Norepinephrine peripheral protocol ordered, K+ and Calcium replaced. Repeat Lactic acid following fluid bolus ordered.        Thomasene Lot Chanan Detwiler 11/30/2019, 11:20 PM

## 2019-11-30 NOTE — Sepsis Progress Note (Signed)
Sepsis protocol being followed by eLink 

## 2019-11-30 NOTE — ED Notes (Signed)
Patient provided socks and warm blankets

## 2019-11-30 NOTE — Progress Notes (Signed)
  Echocardiogram 2D Echocardiogram has been performed.  Janalyn Harder 11/30/2019, 3:34 PM

## 2019-11-30 NOTE — ED Notes (Signed)
Patient titrated off of supplemental O2 to room air, sats remain 100%, nad.

## 2019-11-30 NOTE — ED Notes (Signed)
IV removed from L hand due to infiltration and pain. Heat pack applied to site.

## 2019-11-30 NOTE — ED Notes (Signed)
Critical Care at bedside.  

## 2019-11-30 NOTE — Sepsis Progress Note (Signed)
This pt has been a very difficult stick for IV's and labs. Bedside nurse in ED and 12M are aware of need for repeat lactic acid. But have not been able to obtain yet.

## 2019-11-30 NOTE — ED Notes (Signed)
Supplemental O2 titrated down to 2L from 4L, patient's O2 sats remain 100% at this time.

## 2019-11-30 NOTE — Progress Notes (Signed)
eLink Physician-Brief Progress Note Patient Name: Amber [name] DOB: Mar 11, 1990 MRN: [id number]   Date of Service  11/30/2019  HPI/Events of Note  Patient is a difficult stick for labs, RT is in the room attempting to draw blood for an ABG.  eICU Interventions  Will add I-STAT 7 to ABG draw to enable electrolyte and hemoglobin check.        Thomasene Lot [name] 11/30/2019, 8:30 PM

## 2019-11-30 NOTE — Progress Notes (Signed)
ANTIBIOTIC CONSULT NOTE - Initial Consult  Pharmacy Consult for Zosyn + vancomycin  Indication: Sepsis / endocarditis   No Known Allergies  Patient Measurements:   Heparin Dosing Weight: 49 kg (as of 11/07/19)  Vital Signs: BP: 126/115 (11/17 1100) Pulse Rate: 111 (11/17 1100)  Labs: No results for input(s): HGB, HCT, PLT, APTT, LABPROT, INR, HEPARINUNFRC, HEPRLOWMOCWT, CREATININE, CKTOTAL, CKMB, TROPONINIHS in the last 72 hours.  CrCl cannot be calculated (Patient's most recent lab result is older than the maximum 21 days allowed.).   Medical History: Past Medical History:  Diagnosis Date   Heroin abuse (HCC)    Kidney stones    MRSA (methicillin resistant staph aureus) culture positive    Ovarian cyst    UTI (lower urinary tract infection)     Medications:  (Not in a hospital admission)   Assessment: 63 YOF with a recent h/o MRSA bacteremia and endocarditis c/b septic emboli, right ventricular mass and IVDA left AMA prior to treatment completion. She returns to the ED today with shortness of breath, fatigue and nausea.   ID: Pharmacy consulted to start Zosyn and Vancomycin for empiric treatment with MRSA coverage. WBC elevated at 16. SCr elevated at 1.39.   Of note, IV heparin was briefly started due to concern for possible RV thrombus from previous admission but per discussion with admitting MD, no concern for clot.     Plan:  -Vancomycin 1 gm IV load followed by vancomycin 500 mg IV Q 24 hours -Zosyn 3.375 gm IV Q 8 hours (EI infusion) -Monitor CBC, cultures and clinical progress -VT at [facility]    Vinnie Level, PharmD., BCPS, BCCCP Clinical Pharmacist Please refer to [facility] for unit-specific pharmacist

## 2019-11-30 NOTE — Procedures (Signed)
Arterial Catheter Insertion Procedure Note  Amber [name]  [id number]  [birth year]  Date:11/30/19  Time:9:07 PM    Provider Performing: Hiram Comber    Procedure: Insertion of Arterial Line (81275) without US guidance  Indication(s) Blood pressure monitoring and/or need for frequent ABGs  Consent Risks of the procedure as well as the alternatives and risks of each were explained to the patient and/or caregiver.  Consent for the procedure was obtained and is signed in the bedside chart  Anesthesia None   Time Out Verified patient identification, verified procedure, site/side was marked, verified correct patient position, special equipment/implants available, medications/allergies/relevant history reviewed, required imaging and test results available.   Sterile Technique Maximal sterile technique including full sterile barrier drape, hand hygiene, sterile gown, sterile gloves, mask, hair covering, sterile ultrasound probe cover (if used).   Procedure Description Area of catheter insertion was cleaned with chlorhexidine and draped in sterile fashion. Without real-time ultrasound guidance an arterial catheter was placed into the right radial artery.  Appropriate arterial tracings confirmed on monitor.     Complications/Tolerance None; patient tolerated the procedure well.   EBL Minimal   Specimen(s) None

## 2019-12-01 DIAGNOSIS — I330 Acute and subacute infective endocarditis: Secondary | ICD-10-CM

## 2019-12-01 DIAGNOSIS — B9562 Methicillin resistant Staphylococcus aureus infection as the cause of diseases classified elsewhere: Secondary | ICD-10-CM

## 2019-12-01 LAB — MAGNESIUM: Magnesium: 2.2 mg/dL (ref 1.7–2.4)

## 2019-12-01 LAB — PHOSPHORUS: Phosphorus: 4.6 mg/dL (ref 2.5–4.6)

## 2019-12-01 LAB — LACTIC ACID, PLASMA
Lactic Acid, Venous: 3.9 mmol/L (ref 0.5–1.9)
Lactic Acid, Venous: 2.7 mmol/L (ref 0.5–1.9)
Lactic Acid, Venous: 2.6 mmol/L (ref 0.5–1.9)

## 2019-12-01 LAB — ABO/RH: ABO/RH(D): A POS

## 2019-12-01 LAB — PREPARE RBC (CROSSMATCH)

## 2019-12-01 LAB — TROPONIN I (HIGH SENSITIVITY): Troponin I (High Sensitivity): 11 ng/L (ref <18)

## 2019-12-01 LAB — BASIC METABOLIC PANEL
Anion gap: 12 (ref 5–15)
BUN: 43 mg/dL — ABNORMAL HIGH (ref 6–20)
CO2: 20 mmol/L — ABNORMAL LOW (ref 22–32)
Calcium: 7.6 mg/dL — ABNORMAL LOW (ref 8.9–10.3)
Chloride: 100 mmol/L (ref 98–111)
Creatinine, Ser: 0.97 mg/dL (ref 0.44–1.00)
GFR, Estimated: >60 mL/min (ref >60)
Glucose, Bld: 103 mg/dL — ABNORMAL HIGH (ref 70–99)
Potassium: 3.6 mmol/L (ref 3.5–5.1)
Sodium: 132 mmol/L — ABNORMAL LOW (ref 135–145)

## 2019-12-01 MED ORDER — ORAL CARE MOUTH RINSE
15.0000 mL | Freq: Two times a day (BID) | OROMUCOSAL | Status: DC
  Administered 2019-12-01 – 2019-12-03 (×4): 15 mL via OROMUCOSAL

## 2019-12-01 MED ORDER — MUPIROCIN 2 % EX OINT
1.0000 "application " | TOPICAL_OINTMENT | Freq: Two times a day (BID) | CUTANEOUS | Status: AC
  Administered 2019-12-01 – 2019-12-05 (×10): 1 via NASAL
  Filled 2019-12-01 (×2): qty 22

## 2019-12-01 MED ORDER — CHLORHEXIDINE GLUCONATE CLOTH 2 % EX PADS
6.0000 | MEDICATED_PAD | Freq: Every day | CUTANEOUS | Status: DC
  Administered 2019-12-01 – 2020-01-15 (×37): 6 via TOPICAL

## 2019-12-01 MED ORDER — HYDROMORPHONE HCL 1 MG/ML IJ SOLN
1.0000 mg | INTRAMUSCULAR | Status: AC | PRN
  Administered 2019-12-01 (×2): 1 mg via INTRAVENOUS

## 2019-12-01 MED ORDER — POTASSIUM CHLORIDE 20 MEQ PO PACK
40.0000 meq | PACK | Freq: Once | ORAL | Status: AC
  Administered 2019-12-01: 40 meq via ORAL
  Filled 2019-12-01: qty 2

## 2019-12-01 MED ORDER — LORAZEPAM 2 MG/ML IJ SOLN: 1.0000 mg | INTRAMUSCULAR | Status: DC | PRN

## 2019-12-01 MED ORDER — LORAZEPAM 2 MG/ML IJ SOLN
1.0000 mg | INTRAMUSCULAR | Status: DC | PRN
  Administered 2019-12-01 – 2019-12-08 (×6): 1 mg via INTRAVENOUS
  Filled 2019-12-01 (×6): qty 1

## 2019-12-01 MED ORDER — SODIUM CHLORIDE 0.9 % IV SOLN
0.5000 mg/kg/h | INTRAVENOUS | Status: DC
  Administered 2019-12-01: 0.5 mg/kg/h via INTRAVENOUS
  Filled 2019-12-01: qty 5

## 2019-12-01 MED ORDER — LACTATED RINGERS IV BOLUS
1000.0000 mL | Freq: Once | INTRAVENOUS | Status: AC
  Administered 2019-12-01: 1000 mL via INTRAVENOUS

## 2019-12-01 MED ORDER — ENOXAPARIN SODIUM 30 MG/0.3ML ~~LOC~~ SOLN
30.0000 mg | SUBCUTANEOUS | Status: DC
  Administered 2019-12-01 – 2019-12-10 (×10): 30 mg via SUBCUTANEOUS
  Filled 2019-12-01 (×11): qty 0.3

## 2019-12-01 MED ORDER — HYDROMORPHONE HCL 1 MG/ML IJ SOLN: 1.0000 mg | INTRAMUSCULAR | Status: DC | PRN

## 2019-12-01 MED ORDER — POTASSIUM CHLORIDE 20 MEQ PO PACK: 20.0000 meq | PACK | Freq: Two times a day (BID) | ORAL | Status: DC

## 2019-12-01 MED ORDER — OXYCODONE-ACETAMINOPHEN 5-325 MG PO TABS
1.0000 | ORAL_TABLET | Freq: Four times a day (QID) | ORAL | Status: DC | PRN
  Administered 2019-12-01 – 2019-12-04 (×9): 1 via ORAL
  Filled 2019-12-01 (×10): qty 1

## 2019-12-01 MED ORDER — LACTATED RINGERS IV SOLN: INTRAVENOUS | Status: DC

## 2019-12-01 MED ORDER — VANCOMYCIN HCL IN DEXTROSE 1-5 GM/200ML-% IV SOLN
1000.0000 mg | INTRAVENOUS | Status: DC
  Administered 2019-12-01: 1000 mg via INTRAVENOUS
  Filled 2019-12-01 (×2): qty 200

## 2019-12-01 NOTE — Progress Notes (Addendum)
Patient to be assessed for CVTS surgery in am. Will reassess after more stable for influenza and pneumococccal vaccines.

## 2019-12-01 NOTE — H&P (Addendum)
This is a progress note 12/01/2019 I saw and evaluated the patient. Discussed with resident and agree with resident's findings and plan as documented in the resident's note.  I have seen and evaluated the patient for septic shock secondary to endocarditis.  S:  No events, feels terrible, pain all over.  Remains on pressors.  O: Blood pressure 95/64, pulse 94, temperature 98 F (36.7 C), temperature source Oral, resp. rate (!) 25, weight 44 kg, SpO2 100 %.  Chronically ill cachetic young woman in NAD MM dry Rhonci bilaterally on lung exam, mildly tachypneic +Murmur, ext warm, heart sounds regular Diffuse TTP in all muscles/joints  Renal function better CXR bilateral infiltrates c/w septic emboli Echo images reviewed with large vegetation burden and tricuspid failure  A:  Septic shock secondary to incompletely treated MRSA endocarditis Acute kidney injury- pre-renal and septic ATN question septic emboli Acute hypoxemic respiratory failure due to significant pulmonary septic emboli burden Anemia related to chronic disease Reactive thrombocytopenia Starvation Prolonged IVDA  P:  - Titrate pressors - Keep bolusing fluids - f/u blood cultures, vanc/zosyn for now - percocet/dilaudid PRN withdraw symptoms - Mother at bedside, informed both that if patient leaves AMA again will likely die, will have palliative care establish relationship - Will ask Dr. Cliffton Asters if he thinks she may benefit from intravascular debulking of her tricuspid valve  Patient critically ill due to septic shock Interventions to address this today pressor titration Risk of deterioration without these interventions is high  I personally spent 39 minutes providing critical care not including any separately billable procedures  Myrla Halsted MD [place] [facility] 12/01/2019 10:54 AM Personal pager: [phone] If unanswered, please page CCM On-call: #[phone]         NAME:  Amber [name], MRN:  [id number], DOB:  [birth year], LOS: 1 ADMISSION DATE:  11/30/2019, CONSULTATION DATE:  12/01/2019  REFERRING MD:  Lysle Morales, EDP, CHIEF COMPLAINT: Generalized weakness, hypotension  Brief History   29 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  Seen in consultation by cardiology, cardiothoracic surgery and infectious disease.  Not felt to require surgery/also not a candidate given ongoing IVDU.  She signed out AMA.  Has been living with her best friend since. Mom reports ongoing IVDU.  She had to be removed from her vehicle due to lethargy, needles noted on her arm by triage nurse.  She reports progressive weakness and pain in her sides.  Hypotensive in the ED, received 2 L of fluids and empiric Zosyn/vancomycin.  Past Medical History  IVDU  Significant Hospital Events   10/23-26 hospital admit for endocarditis, signed out AMA  Consults:    Procedures:    Significant Diagnostic Tests:  Echo 11/17 >> 2.36 cm X 1.24c vegetation on the tricuspid valve; predominantely on the septal and posterior leaflets, with severe regurgitation.   MR lumbar/thoracic spine 10/22 >> no evidence of discitis or osteomyelitis CT angio chest 10/21 >> numerous nodules with some cavitation, consistent with septic emboli CT foot left/CT right ankle >> evidence of cellulitis, no osteomyelitis  Micro Data:  Blood 11/17 >>  Antimicrobials:  Zosyn 11/17 >> Vanco 11/17 >>  Interim history/subjective:    Objective   Blood pressure 95/64, pulse 94, temperature 98 F (36.7 C), temperature source Oral, resp.  rate (!) 26, SpO2 100 %.        Intake/Output Summary (Last 24 hours) at 12/01/2019 0823 Last data filed at 12/01/2019 0600 Gross per 24 hour  Intake  6159.16 ml  Output 300 ml  Net 5859.16 ml   There were no vitals filed for this visit.  Examination: General: Cachectic young woman appears much older than stated age HENT: Hair matted, colored, pallor+, no icterus Lungs: Clear breath sounds bilateral, no accessory muscle use Cardiovascular: S1-S2 tacky, S3 gallop Abdomen: Soft, diffuse tenderness, no guarding or rigidity Extremities: Track marks in both hands and arms, cool digits and toes, with socks and warmer Neuro: Lethargic, alert when aroused, no meningeal signs, grossly nonfocal  Resolved Hospital Problem list     Assessment & Plan:  Septic shock likely due to MRSA bacteremia and tricusid valve endocarditis -Has received 3 L of fluid and 1 unit of PRBC due to anemia - MAPs in 70s on neo and levo   - Lactate improving with IVF - Continue IVF - Wean pressors as tolerated - Empiric Zosyn and vancomycin - Follow cultures  Tricuspid valve endocarditis  No recurrent back or foot pain -repeat echo with increased size in TV vegetation to 2.36 x 1.24 cm -Will discuss vegetation debulking with CT surgery   AKI -likely prerenal vs component of ATN Cr. Improved to 0.97  Avoid nephrotoxins Monitor electrolytes, replete potassium Anemia, normocytic  -Received one unit yesterday hbg improved from  6.4 to 8.8 -No evidence of blood loss currently -Continue to monitor CBC -Transfuse if Hgb<7  Severe thrombocytopenia likely due to MRSA sepsis Plts 60 this morning, no signs of bleeding continue to monitor  Heroin addiction  -watch for withdrawal - Percocet 5-325 mg q6 PRN  Best practice:  Diet: Full liquids Pain/Anxiety/Delirium protocol (if indicated): N/A VAP protocol (if indicated): N/A DVT prophylaxis: Lovenox GI prophylaxis: N/A Glucose control: CBGs Mobility: Bedrest Code Status: Full Family Communication: Mom updated at bedside Disposition: ICU  Labs   CBC: Recent Labs  Lab 11/30/19 1226 11/30/19 1252  11/30/19 2115 12/01/19 0405  WBC 16.0*  --   --  23.8*  NEUTROABS 12.7*  --   --   --   HGB 6.4* 7.1* 10.2* 8.8*  HCT 21.9* 21.0* 30.0* 27.8*  MCV 83.9  --   --  82.2  PLT 47*  --   --  60*    Basic Metabolic Panel: Recent Labs  Lab 11/30/19 1226 11/30/19 1252 11/30/19 2115 12/01/19 0405  NA 133* 132* 134* 132*  K 3.2* 3.2* 3.1* 3.6  CL 93*  --   --  100  CO2 14*  --   --  20*  GLUCOSE 130*  --   --  103*  BUN 47*  --   --  43*  CREATININE 1.39*  --   --  0.97  CALCIUM 8.2*  --   --  7.6*  MG  --   --   --  2.2  PHOS  --   --   --  4.6   GFR: CrCl cannot be calculated (Unknown ideal weight.). Recent Labs  Lab 11/30/19 1226 11/30/19 1228 12/01/19 0006 12/01/19 0405  WBC 16.0*  --   --  23.8*  LATICACIDVEN  --  >11.0* 3.9* 2.7*    Liver Function Tests: Recent Labs  Lab 11/30/19 1226  AST 37  ALT 14  ALKPHOS 94  BILITOT 2.0*  PROT 5.8*  ALBUMIN 1.6*   No results for  input(s): LIPASE, AMYLASE in the last 168 hours. No results for input(s): AMMONIA in the last 168 hours.  ABG    Component Value Date/Time   PHART 7.448 11/30/2019 2115   PCO2ART 31.1 (L) 11/30/2019 2115   PO2ART 56 (L) 11/30/2019 2115   HCO3 21.5 11/30/2019 2115   TCO2 22 11/30/2019 2115   ACIDBASEDEF 2.0 11/30/2019 2115   O2SAT 90.0 11/30/2019 2115     Coagulation Profile: Recent Labs  Lab 11/30/19 1226  INR 1.7*    Cardiac Enzymes: No results for input(s): CKTOTAL, CKMB, CKMBINDEX, TROPONINI in the last 168 hours.  HbA1C: Hgb A1c MFr Bld  Date/Time Value Ref Range Status  11/04/2019 04:34 AM 6.3 (H) 4.8 - 5.6 % Final    Comment:    (NOTE)         Prediabetes: 5.7 - 6.4         Diabetes: >6.4         Glycemic control for adults with diabetes: <7.0     CBG: No results for input(s): GLUCAP in the last 168 hours.  Review of Systems:   Head hurts Whole body hurts Lost weight Generalized weakness Dizziness No nausea/vomiting/diarrhea No  hematemesis/melena   Past Medical History  She,  has a past medical history of Heroin abuse (HCC), Kidney stones, MRSA (methicillin resistant staph aureus) culture positive, Ovarian cyst, and UTI (lower urinary tract infection).   Surgical History    Past Surgical History:  Procedure Laterality Date  . FINGER SURGERY    . TEE WITHOUT CARDIOVERSION N/A 11/07/2019   Procedure: TRANSESOPHAGEAL ECHOCARDIOGRAM (TEE);  Surgeon: Wendall Stade, MD;  Location: [facility] ENDOSCOPY;  Service: Cardiovascular;  Laterality: N/A;     Social History   reports that she has quit smoking. Her smoking use included cigarettes. She has never used smokeless tobacco. She reports current drug use. Drugs: Marijuana and IV. She reports that she does not drink alcohol.   Family History   Her family history includes Valvular heart disease in her maternal grandmother.   Allergies Allergies  Allergen Reactions  . Bee Venom Anaphylaxis     Home Medications  Prior to Admission medications   Medication Sig Start Date End Date Taking? Authorizing Provider  acetaminophen (TYLENOL) 500 MG tablet Take 1,000 mg by mouth every 6 (six) hours as needed for headache.    Yes [provider]  ibuprofen (ADVIL) 600 MG tablet Take 1 tablet (600 mg total) by mouth every 6 (six) hours as needed. Patient not taking: Reported on 11/03/2019 08/20/18   Fayrene Helper, PA-C  ondansetron (ZOFRAN ODT) 4 MG disintegrating tablet Take 1 tablet (4 mg total) by mouth every 8 (eight) hours as needed for nausea or vomiting. Patient not taking: Reported on 11/03/2019 01/11/19   Liberty Handy, PA-C     [facility] PGY-1 12/01/19 9:41 AM

## 2019-12-01 NOTE — Progress Notes (Signed)
Pharmacy Antibiotic Note  Amber [name] is a 29 y.o. female admitted on 11/30/2019 with sepsis and endocarditis. The patient is an IVDU with known tricuspid MRSA endocarditis who previously left [facility] AMA x2. The patient now returns with septic shock. Pharmacy has been consulted for vancomycin and piperacillin/tazobactam dosing.  Upon admission, the patient's SCr was 1.39, up from baseline, but now has trended down to 0.97. The patient received 1 dose of vancomycin 1000 mg as a loading dose.   Plan: Administer vancomycin IV 1000 mg q24h Target vancomycin trough of 15-20 mcg/mL, obtain troughs at steady state Continue piperacillin/tazobactam IV 3.375 g q8h Monitor renal function, CBC, clinical status, cultures and length of therapy  Weight: 44 kg (97 lb)  Temp (24hrs), Avg:97.2 F (36.2 C), Min:96.3 F (35.7 C), Max:98 F (36.7 C)  Recent Labs  Lab 11/30/19 1226 11/30/19 1228 12/01/19 0006 12/01/19 0405  WBC 16.0*  --   --  23.8*  CREATININE 1.39*  --   --  0.97  LATICACIDVEN  --  >11.0* 3.9* 2.7*    Estimated Creatinine Clearance: 59.4 mL/min (by C-G formula based on SCr of 0.97 mg/dL).    Allergies  Allergen Reactions  . Bee Venom Anaphylaxis    Antimicrobials this admission: 11/17 vancomycin 11/17 piperacillin/tazobactam  Microbiology results: 11/17 BCx: sent 11/17 Respiratory panel: negative  11/17 MRSA PCR: positive  Thank you for allowing pharmacy to be a part of this patient's care.  Sanda Klein, PharmD, RPh  PGY-1 Pharmacy Resident 12/01/2019 9:52 AM  Please check [email or web address] for unit-specific pharmacy phone numbers.

## 2019-12-01 NOTE — Consult Note (Addendum)
[address]       [place] [address]             [phone]        Amber [name] [facility] Medical Record #[id number] Date of Birth: [birth year]  Referring: No ref. provider found Primary Care: Patient, No Pcp Per Primary Cardiologist:No primary care provider on file.  Chief Complaint:    Chief Complaint  Patient presents with  . Drug Overdose    History of Present Illness:      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  She has been admitted a few times and signed out AMA.  When she was admitted from 10/23-10/26 she was found to have tricuspid endocarditis on TEE.  There was a 1 cm vegetation on her tricuspid valve.  She was seen at that time by cardiology, cardiothoracic surgery, and infectious disease.  She was not felt to be a surgical candidate with ongoing IV drug use.  She returned to the ED yesterday after being removed from her vehicle due to lethargy.  She was hypotensive in the emergency department and received 2 L of IV fluids and was started on empiric Zosyn and vancomycin.  She does have a history of tobacco abuse.  She was admitted by critical care. He blood cultures were positive for MRSA and they continued to titrate her pressors as indicated.  We are consulted for possible angiovac procedure.  Patient has been living with a friend and not at home. I did have a discussion with the patient and mother at the bedside about drub rehab post hospitalization. Both seemed open to exploring options.    Current Activity/ Functional Status: Patient was independent with mobility/ambulation, transfers, ADL's, IADL's.   Zubrod Score: At the time of surgery this patient's most appropriate activity status/level should be described as:     0    Normal activity, no symptoms     1    Restricted in physical strenuous activity but ambulatory, able to do out  light work     2    Ambulatory and capable of self care, unable to do work activities, up and about                 more than 50%  Of the time                                3    Only limited self care, in bed greater than 50% of waking hours     4    Completely disabled, no self care, confined to bed or chair     5    Moribund  Past Medical History:  Diagnosis Date  . Heroin abuse (HCC)   . Kidney stones   . MRSA (methicillin resistant staph aureus) culture positive   . Ovarian cyst   . UTI (lower urinary tract infection)     Past Surgical History:  Procedure Laterality Date  . FINGER SURGERY    . TEE WITHOUT CARDIOVERSION N/A 11/07/2019   Procedure: TRANSESOPHAGEAL ECHOCARDIOGRAM (TEE);  Surgeon: Wendall Stade, MD;  Location: [facility] ENDOSCOPY;  Service: Cardiovascular;  Laterality: N/A;    Social History   Tobacco Use  Smoking Status Former Smoker  . Types: Cigarettes  Smokeless Tobacco Never Used    Social  History   Substance and Sexual Activity  Alcohol Use No     Allergies  Allergen Reactions  . Bee Venom Anaphylaxis    Current Facility-Administered Medications  Medication Dose Route Frequency Provider Last Rate Last Admin  . 0.9 %  sodium chloride infusion  250 mL Intravenous Continuous Oretha Milch, MD   Stopped at 12/01/19 0400  . 0.9 %  sodium chloride infusion   Intra-arterial PRN Oretha Milch, MD      . acetaminophen (TYLENOL) tablet 650 mg  650 mg Oral Q4H PRN Oretha Milch, MD      . Chlorhexidine Gluconate Cloth 2 % PADS 6 each  6 each Topical Daily Oretha Milch, MD      . docusate sodium (COLACE) capsule 100 mg  100 mg Oral BID PRN Oretha Milch, MD      . enoxaparin (LOVENOX) injection 30 mg  30 mg Subcutaneous Q24H Lorin Glass, MD      . HYDROmorphone (DILAUDID) injection 0.5-1 mg  0.5-1 mg Intravenous Q4H PRN Oretha Milch, MD   1 mg at 12/01/19 0902  . lactated ringers bolus 1,000 mL  1,000 mL Intravenous Once Quincy Simmonds, MD       . lactated ringers infusion   Intravenous Continuous Quincy Simmonds, MD 75 mL/hr at 12/01/19 0942 New Bag at 12/01/19 0942  . mupirocin ointment (BACTROBAN) 2 % 1 application  1 application Nasal BID Ogan, Okoronkwo U, MD      . norepinephrine (LEVOPHED) 4mg  in premix infusion  0-10 mcg/min Intravenous Titrated , MD 37.5 mL/hr at 12/01/19 0901 10 mcg/min at 12/01/19 0901  . ondansetron (ZOFRAN) injection 4 mg  4 mg Intravenous Q6H PRN 12/03/19, MD   4 mg at 12/01/19 0800  . oxyCODONE-acetaminophen (PERCOCET/ROXICET) 5-325 MG per tablet 1 tablet  1 tablet Oral Q6H PRN 12/03/19, MD   1 tablet at 12/01/19 1021  . phenylephrine (NEOSYNEPHRINE) 10-0.9 MG/250ML-% infusion  25-200 mcg/min Intravenous Titrated 12/03/19, MD 75 mL/hr at 12/01/19 0941 50 mcg/min at 12/01/19 0941  . piperacillin-tazobactam (ZOSYN) IVPB 3.375 g  3.375 g Intravenous Q8H Mancheril, 12/03/19, RPH 12.5 mL/hr at 12/01/19 1022 3.375 g at 12/01/19 1022  . polyethylene glycol (MIRALAX / GLYCOLAX) packet 17 g  17 g Oral Daily PRN 12/03/19, MD      . vancomycin (VANCOCIN) IVPB 1000 mg/200 mL premix  1,000 mg Intravenous Q24H Oretha Milch, [facility]        Medications Prior to Admission  Medication Sig Dispense Refill Last Dose  . acetaminophen (TYLENOL) 500 MG tablet Take 1,000 mg by mouth every 6 (six) hours as needed for headache.    11/29/2019 at Unknown time  . ibuprofen (ADVIL) 600 MG tablet Take 1 tablet (600 mg total) by mouth every 6 (six) hours as needed. (Patient not taking: Reported on 11/03/2019) 30 tablet 0 Not Taking at Unknown time  . ondansetron (ZOFRAN ODT) 4 MG disintegrating tablet Take 1 tablet (4 mg total) by mouth every 8 (eight) hours as needed for nausea or vomiting. (Patient not taking: Reported on 11/03/2019) 20 tablet 0 Not Taking at Unknown time    Family History  Problem Relation Age of Onset  . Valvular heart disease Maternal Grandmother       Review of Systems:   Review of Systems  Constitutional: Positive for malaise/fatigue and weight loss.  Cardiovascular: Negative for chest pain and leg swelling.  Gastrointestinal: Positive for abdominal pain (cramping).  Musculoskeletal: Positive for joint pain and myalgias.  Psychiatric/Behavioral: Positive for substance abuse. The patient is nervous/anxious.    Pertinent items are noted in HPI.       Physical Exam: BP 95/64   Pulse 94   Temp 98 F (36.7 C) (Oral)   Resp (!) 25   Wt 44 kg   SpO2 100%   BMI 17.74 kg/m    General appearance: alert, cooperative and mild distress Resp: clear to auscultation bilaterally Cardio: sinus tachycardia, no murmur GI: soft, + cramping Extremities: no edema, pain in feet Neurologic: Grossly normal       Recent Radiology Findings:   DG Chest Port 1 View  Result Date: 11/30/2019 CLINICAL DATA:  Sepsis EXAM: PORTABLE CHEST 1 VIEW COMPARISON:  11/05/2019 FINDINGS: Persistent bilateral nodular opacities reflecting septic emboli seen on prior chest CT. No pleural effusion or pneumothorax. Cardiomediastinal contours are within normal limits. IMPRESSION: Persistent bilateral nodular opacities reflecting septic emboli seen on prior chest CT. Electronically Signed   By: Guadlupe Spanish M.D.   On: 11/30/2019 11:30   ECHOCARDIOGRAM COMPLETE  Result Date: 11/30/2019    ECHOCARDIOGRAM REPORT   Patient Name:   Amber [name] Date of Exam: 11/30/2019 Medical Rec #:  [id number]        Height:       62.0 in Accession #:    [id number]       Weight:       108.7 lb Date of Birth:  [birth year]         BSA:          1.475 m Patient Age:    29 years         BP:           78/52 mmHg Patient Gender: F                HR:           100 bpm. Exam Location:  Inpatient Procedure: 2D Echo, 3D Echo, Cardiac Doppler and Color Doppler STAT ECHO Indications:    Endocarditis  History:        Patient has prior history of Echocardiogram examinations, most                  recent 11/07/2019. Endocarditis; Signs/Symptoms:Bacteremia.                 Tricuspid vegetations. IVDU. MRSA. Sepsis.  Sonographer:    Sheralyn Boatman RDCS Referring Phys: 18 RAKESH V ALVA  Sonographer Comments: Technically difficult study due to poor echo windows. Patient moaning throughout exam. IMPRESSIONS  1. 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 There is lack of coaptation and severe regurgitation. Largest dimensions through study 2.36 cm X 1.24cm. The tricuspid valve is abnormal. Tricuspid valve regurgitation is severe.  2. Left ventricular ejection fraction, by estimation, is 65 to 70%. Left ventricular ejection fraction by PLAX is 70 %. The left ventricle has normal function. The left ventricle has no regional wall motion abnormalities. Left ventricular diastolic parameters were normal.  3. Right ventricular systolic function is hyperdynamic. The right ventricular size is normal. There is normal pulmonary artery systolic pressure.  4. The mitral valve is grossly normal. No evidence of mitral valve regurgitation.  5. The aortic valve is grossly normal. Aortic valve regurgitation is not visualized.  6. The  inferior vena cava is dilated in size with <50% respiratory variability, suggesting right atrial pressure of 15 mmHg. Comparison(s): No prior Echocardiogram. Conclusion(s)/Recommendation(s): Evidence of infective endocarditis; primary MD notified. FINDINGS  Left Ventricle: Left ventricular ejection fraction, by estimation, is 65 to 70%. Left ventricular ejection fraction by PLAX is 70 %. The left ventricle has normal function. The left ventricle has no regional wall motion abnormalities. The left ventricular internal cavity size was small. There is no left ventricular hypertrophy. Left ventricular diastolic parameters were normal. Right Ventricle: The right ventricular size  is normal. No increase in right ventricular wall thickness. Right ventricular systolic function is hyperdynamic. There is normal pulmonary artery systolic pressure. The tricuspid regurgitant velocity is 2.50 m/s, and with an assumed right atrial pressure of 8 mmHg, the estimated right ventricular systolic pressure is 33.0 mmHg. Left Atrium: Left atrial size was normal in size. Right Atrium: Right atrial size was normal in size. Pericardium: Trivial pericardial effusion is present. Mitral Valve: The mitral valve is grossly normal. No evidence of mitral valve regurgitation. Tricuspid Valve: Large, greater than 1 cm, vegetation on the tricuspid valve; appears to predominante on the septal and posterior leaflets, with a slightly smaller anterior leafelt vegetation. There is lack of coaptation and severe regurgitation. Largest  dimensions through study 2.36 cm X 1.24cm. The tricuspid valve is abnormal. Tricuspid valve regurgitation is severe. Aortic Valve: The aortic valve is grossly normal. Aortic valve regurgitation is not visualized. Pulmonic Valve: The pulmonic valve was not well visualized. Pulmonic valve regurgitation is not visualized. Aorta: The aortic root and ascending aorta are structurally normal, with no evidence of dilitation. Venous: The inferior vena cava is dilated in size with less than 50% respiratory variability, suggesting right atrial pressure of 15 mmHg. IAS/Shunts: The atrial septum is grossly normal.  LEFT VENTRICLE PLAX 2D LV EF:         Left            Diastology                ventricular     LV e' medial:    12.90 cm/s                ejection        LV E/e' medial:  3.8                fraction by     LV e' lateral:   14.90 cm/s                PLAX is 70      LV E/e' lateral: 3.3                %. LVIDd:         2.90 cm LVIDs:         1.80 cm LV PW:         1.00 cm LV IVS:        0.80 cm LVOT diam:     1.80 cm LVOT Area:     2.54 cm  LV Volumes (MOD) LV vol d, MOD    42.9 ml A2C: LV vol d,  MOD    28.2 ml A4C: LV vol s, MOD    14.7 ml A2C: LV vol s, MOD    12.4 ml A4C: LV SV MOD A2C:   28.2 ml LV SV MOD A4C:   28.2 ml LV SV MOD BP:    23.6 ml RIGHT VENTRICLE  IVC TAPSE (M-mode): 2.5 cm  IVC diam: 2.30 cm LEFT ATRIUM             Index       RIGHT ATRIUM           Index LA diam:        2.70 cm 1.83 cm/m  RA Area:     20.10 cm LA Vol (A2C):   14.8 ml 10.03 ml/m RA Volume:   65.10 ml  44.13 ml/m LA Vol (A4C):   10.1 ml 6.85 ml/m LA Biplane Vol: 13.5 ml 9.15 ml/m   AORTA Ao Root diam: 2.70 cm Ao Asc diam:  2.10 cm MITRAL VALVE               TRICUSPID VALVE MV Area (PHT): 3.27 cm    TR Peak grad:   25.0 mmHg MV Decel Time: 232 msec    TR Vmax:        250.00 cm/s MV E velocity: 49.50 cm/s MV A velocity: 58.30 cm/s  SHUNTS MV E/A ratio:  0.85        Systemic Diam: 1.80 cm Riley Lam MD Electronically signed by Riley Lam MD Signature Date/Time: 11/30/2019/4:00:21 PM    Final      I have independently reviewed the above radiologic studies and discussed with the patient   Recent Lab Findings: Lab Results  Component Value Date   WBC 23.8 (H) 12/01/2019   HGB 8.8 (L) 12/01/2019   HCT 27.8 (L) 12/01/2019   PLT 60 (L) 12/01/2019   GLUCOSE 103 (H) 12/01/2019   ALT 14 11/30/2019   AST 37 11/30/2019   NA 132 (L) 12/01/2019   K 3.6 12/01/2019   CL 100 12/01/2019   CREATININE 0.97 12/01/2019   BUN 43 (H) 12/01/2019   CO2 20 (L) 12/01/2019   TSH 0.402 11/03/2019   INR 1.7 (H) 11/30/2019   HGBA1C 6.3 (H) 11/04/2019      Assessment / Plan:      1. Septic shock likely due to MRSA bacteremia and tricuspid valve endocarditis-continue IV abx. Positive BC for MRSA. ID consult encouraged. Continue lovenox. Requiring Levo and Neo for BP support. 2. AKI- creatinine down to 0.97, 2L fluid resuscitation.  3. Anemia-dropped from 10.2/30.0 to 8.8/27.8, no evidence of blood loss 4. Thrombocytopenia-60k, continue to monitor 5. IVDU- Heroin dependence, cessation required,  discussed rehab after hospitalization. Patient appears open to this.  6. COVID negative 7. Severe malnutrition-BMI 17, continue IV fluids and encourage oral intake.  Plan: Discussed the procedure of angiovac with the patient and her mother at the bedside in detail. Questions answered. The patient was very concerned that she would not be able to eat for a whole day. Addressed the severity of her condition and the need for several weeks of IV antibiotics here at the hospital in addition to the procedure. Dr. Cliffton Asters to discuss timing of angiovac tomorrow when he sees in consult.     I  spent 20 minutes counseling the patient face to face.   Jari Favre, PA-C 12/01/2019 11:16 AM   Agree with above. This is a 29 year old female with a complicated past medical history involving IV drug abuse, MRSA bacteremia, tricuspid valve endocarditis.  She has been seen at our hospital on several occasions for these issues and in October she left AMA prior to completion of her medical therapy for tricuspid valve endocarditis.  I reviewed her most recent echo as well as the one from October, and the vegetation  is much bigger and is more atrial compared to the transesophageal echocardiogram from October.  There is also been significant destruction of the valve and she now has severe TR.  Her blood cultures also positive for MRSA.  Due to her ongoing IV drug abuse she is not a candidate for surgical valve replacement.  She does have a significant portion of bulky vegetation on the atrial side of her valve, which would be amenable to angio VAC debridement.  I discussed the risks and benefits with the patient and her mother and they are in agreement to proceed.  She is tentatively scheduled for December 05, 2019.  Orie Cuttino [name] Cherita Hebel

## 2019-12-02 DIAGNOSIS — B9562 Methicillin resistant Staphylococcus aureus infection as the cause of diseases classified elsewhere: Secondary | ICD-10-CM

## 2019-12-02 DIAGNOSIS — F1193 Opioid use, unspecified with withdrawal: Secondary | ICD-10-CM | POA: Diagnosis present

## 2019-12-02 DIAGNOSIS — R7881 Bacteremia: Secondary | ICD-10-CM

## 2019-12-02 DIAGNOSIS — F1124 Opioid dependence with opioid-induced mood disorder: Secondary | ICD-10-CM

## 2019-12-02 DIAGNOSIS — I079 Rheumatic tricuspid valve disease, unspecified: Secondary | ICD-10-CM | POA: Diagnosis present

## 2019-12-02 DIAGNOSIS — D696 Thrombocytopenia, unspecified: Secondary | ICD-10-CM

## 2019-12-02 DIAGNOSIS — Z515 Encounter for palliative care: Secondary | ICD-10-CM

## 2019-12-02 LAB — BASIC METABOLIC PANEL
Anion gap: 12 (ref 5–15)
BUN: 29 mg/dL — ABNORMAL HIGH (ref 6–20)
CO2: 21 mmol/L — ABNORMAL LOW (ref 22–32)
Calcium: 7.6 mg/dL — ABNORMAL LOW (ref 8.9–10.3)
Chloride: 103 mmol/L (ref 98–111)
Creatinine, Ser: 0.81 mg/dL (ref 0.44–1.00)
GFR, Estimated: >60 mL/min (ref >60)
Glucose, Bld: 97 mg/dL (ref 70–99)
Potassium: 3.5 mmol/L (ref 3.5–5.1)
Sodium: 136 mmol/L (ref 135–145)

## 2019-12-02 LAB — TYPE AND SCREEN
ABO/RH(D): A POS
Antibody Screen: NEGATIVE
Unit division: 0

## 2019-12-02 LAB — BLOOD CULTURE ID PANEL (REFLEXED) - BCID2

## 2019-12-02 LAB — CBC
HCT: 27.8 % — ABNORMAL LOW (ref 36.0–46.0)
HCT: 28.7 % — ABNORMAL LOW (ref 36.0–46.0)
Hemoglobin: 8.8 g/dL — ABNORMAL LOW (ref 12.0–15.0)
Hemoglobin: 9.4 g/dL — ABNORMAL LOW (ref 12.0–15.0)
MCH: 26 pg (ref 26.0–34.0)
MCH: 26.6 pg (ref 26.0–34.0)
MCHC: 31.7 g/dL (ref 30.0–36.0)
MCHC: 32.8 g/dL (ref 30.0–36.0)
MCV: 82.2 fL (ref 80.0–100.0)
MCV: 81.1 fL (ref 80.0–100.0)
Platelets: 60 10*3/uL — ABNORMAL LOW (ref 150–400)
Platelets: 53 10*3/uL — ABNORMAL LOW (ref 150–400)
RBC: 3.38 MIL/uL — ABNORMAL LOW (ref 3.87–5.11)
RBC: 3.54 MIL/uL — ABNORMAL LOW (ref 3.87–5.11)
RDW: 18.9 % — ABNORMAL HIGH (ref 11.5–15.5)
RDW: 18.4 % — ABNORMAL HIGH (ref 11.5–15.5)
WBC: 23.8 10*3/uL — ABNORMAL HIGH (ref 4.0–10.5)
WBC: 27.2 10*3/uL — ABNORMAL HIGH (ref 4.0–10.5)
nRBC: 1.3 % — ABNORMAL HIGH (ref 0.0–0.2)
nRBC: 3.6 % — ABNORMAL HIGH (ref 0.0–0.2)

## 2019-12-02 LAB — GLUCOSE, CAPILLARY: Glucose-Capillary: 100 mg/dL — ABNORMAL HIGH (ref 70–99)

## 2019-12-02 LAB — LACTIC ACID, PLASMA: Lactic Acid, Venous: 1.4 mmol/L (ref 0.5–1.9)

## 2019-12-02 LAB — BPAM RBC
Blood Product Expiration Date: 202112142359
ISSUE DATE / TIME: 202111180132
Unit Type and Rh: 6200

## 2019-12-02 LAB — CALCIUM, IONIZED: Calcium, Ionized, Serum: 4.6 mg/dL (ref 4.5–5.6)

## 2019-12-02 LAB — PATHOLOGIST SMEAR REVIEW: Path Review: 11192021

## 2019-12-02 MED ORDER — METHADONE HCL 5 MG PO TABS
5.0000 mg | ORAL_TABLET | Freq: Three times a day (TID) | ORAL | Status: DC
  Administered 2019-12-02 – 2019-12-05 (×9): 5 mg via ORAL
  Filled 2019-12-02 (×9): qty 1

## 2019-12-02 MED ORDER — VANCOMYCIN HCL 750 MG/150ML IV SOLN
750.0000 mg | Freq: Three times a day (TID) | INTRAVENOUS | Status: DC
  Administered 2019-12-02 – 2019-12-06 (×12): 750 mg via INTRAVENOUS
  Filled 2019-12-02 (×17): qty 150

## 2019-12-02 MED ORDER — SODIUM CHLORIDE 0.9 % IV SOLN
0.2500 mg/kg/h | INTRAVENOUS | Status: DC
  Administered 2019-12-02 – 2019-12-04 (×2): 0.25 mg/kg/h via INTRAVENOUS
  Filled 2019-12-02 (×3): qty 5

## 2019-12-02 MED ORDER — DOCUSATE SODIUM 100 MG PO CAPS
100.0000 mg | ORAL_CAPSULE | Freq: Two times a day (BID) | ORAL | Status: DC
  Administered 2019-12-02 – 2019-12-09 (×7): 100 mg via ORAL
  Filled 2019-12-02 (×9): qty 1

## 2019-12-02 MED ORDER — POLYETHYLENE GLYCOL 3350 17 G PO PACK
17.0000 g | PACK | Freq: Every day | ORAL | Status: DC
  Administered 2019-12-02 – 2019-12-06 (×3): 17 g via ORAL
  Filled 2019-12-02 (×3): qty 1

## 2019-12-02 MED ORDER — VANCOMYCIN HCL 500 MG/100ML IV SOLN
500.0000 mg | Freq: Three times a day (TID) | INTRAVENOUS | Status: DC
  Filled 2019-12-02: qty 100

## 2019-12-02 MED ORDER — HYDROMORPHONE HCL 1 MG/ML IJ SOLN: 1.0000 mg | INTRAMUSCULAR | Status: DC | PRN

## 2019-12-02 MED ORDER — POTASSIUM CHLORIDE 10 MEQ/100ML IV SOLN
10.0000 meq | INTRAVENOUS | Status: AC
  Administered 2019-12-02 (×4): 10 meq via INTRAVENOUS
  Filled 2019-12-02 (×4): qty 100

## 2019-12-02 NOTE — Progress Notes (Addendum)
NAME:  Amber [name], MRN:  [id number], DOB:  12-May-1990, LOS: 2 ADMISSION DATE:  11/30/2019, CONSULTATION DATE:  12/02/2019  REFERRING MD:  Lysle Morales, EDP, CHIEF COMPLAINT: Generalized weakness, hypotension  Brief History   29 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  Seen in consultation by cardiology, cardiothoracic surgery and infectious disease.  Not felt to require surgery/also not a candidate given ongoing IVDU.  She signed out AMA.  Has been living with her best friend since. Mom reports ongoing IVDU.  She had to be removed from her vehicle due to lethargy, needles noted on her arm by triage nurse.  She reports progressive weakness and pain in her sides.  Hypotensive in the ED, received 2 L of fluids and empiric Zosyn/vancomycin.  Past Medical History  IVDU  Significant Hospital Events   10/23-26 hospital admit for endocarditis, signed out AMA  Consults:  CT surgery Infectious disease  Procedures:    Significant Diagnostic Tests:  CXR 11/17: Persistent bilateral nodular opacities reflecting septic emboli seen on prior chest CT. Echo 11/17 >> 2.36 cm X 1.24c vegetation on the tricuspid valve; predominantely on the septal and posterior leaflets, with severe regurgitation.   MR lumbar/thoracic spine 10/22 >> no evidence of discitis or osteomyelitis CT angio chest 10/21 >> numerous nodules with some cavitation, consistent with septic emboli CT foot left/CT right ankle >> evidence of cellulitis, no osteomyelitis  Micro Data:  Blood cultures 11/17 >>MRSA,  MRSA PCR >> postive Antimicrobials:  Zosyn 11/17 >>11/18 Vanco 11/17 >>  Interim history/subjective:  This morning patient is agitated due to being thirsty as she has been NPO overnight.   Started on Ketamine drip yesterday evening no longer having significant pain.  Objective   Blood pressure 95/64, pulse (!) 111, temperature 97.8 F (36.6 C), temperature source Oral, resp. rate (!) 34, weight 51.1 kg, SpO2 100 %. 2L [place]     Intake/Output Summary (Last 24 hours) at 12/02/2019 0744 Last data filed at 12/02/2019 0700 Gross per 24 hour  Intake 4197.06 ml  Output 1401 ml  Net 2796.06 ml   Filed Weights   12/01/19 0900 12/02/19 0500  Weight: 44 kg 51.1 kg    Examination: General: Cachectic young woman appears much older than stated age HENT: Hair matted, colored, no icterus Lungs: Clear breath sounds bilateral, no accessory muscle use Cardiovascular: S1-S2 tachy, S3 gallop Abdomen: Soft, diffuse tenderness, no guarding or rigidity Extremities: Track marks in both hands and arms, warm Neuro: Awake and oriented, moving all extermities Psych: agitated, uncooperative  Resolved Hospital Problem list     Assessment & Plan:  Septic shock likely due to MRSA bacteremia and tricusid valve endocarditis BP improved with MAPs in the 90s. Off pressors this morning. Cultures positive for MRSA. Zosyn stopped this morning.  - Continue on vancomycin - Follow up TEE - Ct surgery consulted for debulking, plan for surgery on 11/22 - ID consulted appreciate reccomendations  AKI -likely prerenal vs component of ATN Cr. Improved to 0.8 Avoid nephrotoxins Monitor electrolytes, replete potassium  Anemia, Thrombocytopenia 2/2 to critical illness -Continue to monitor CBC -Transfuse if Hgb<7  Severe thrombocytopenia likely due to MRSA sepsis Plts 60 this morning, no signs of bleeding continue to monitor  Heroin addiction  - Methadone for pain  control - Dilaudid, oxycodone PRN - Decrease  Ketamine drip as able   Best practice:  Diet: Regular fiet Pain/Anxiety/Delirium protocol (if indicated): Ketamine, methadone VAP protocol (if indicated): N/A DVT prophylaxis: Lovenox GI  prophylaxis: N/A Glucose control: CBGs Mobility: Bedrest Code Status: Full Family Communication: Mom updated at bedside Disposition: ICU  Labs   CBC: Recent Labs  Lab 11/30/19 1226 11/30/19 1252 11/30/19 2115 12/01/19 0405 12/02/19 0129  WBC 16.0*  --   --  23.8* 27.2*  NEUTROABS 12.7*  --   --   --   --   HGB 6.4* 7.1* 10.2* 8.8* 9.4*  HCT 21.9* 21.0* 30.0* 27.8* 28.7*  MCV 83.9  --   --  82.2 81.1  PLT 47*  --   --  60* 53*    Basic Metabolic Panel: Recent Labs  Lab 11/30/19 1226 11/30/19 1252 11/30/19 2115 12/01/19 0405 12/02/19 0129  NA 133* 132* 134* 132* 136  K 3.2* 3.2* 3.1* 3.6 3.5  CL 93*  --   --  100 103  CO2 14*  --   --  20* 21*  GLUCOSE 130*  --   --  103* 97  BUN 47*  --   --  43* 29*  CREATININE 1.39*  --   --  0.97 0.81  CALCIUM 8.2*  --   --  7.6* 7.6*  MG  --   --   --  2.2  --   PHOS  --   --   --  4.6  --    GFR: Estimated Creatinine Clearance: 81.1 mL/min (by C-G formula based on SCr of 0.81 mg/dL). Recent Labs  Lab 11/30/19 1226 11/30/19 1228 12/01/19 0006 12/01/19 0405 12/01/19 0857 12/02/19 0129  WBC 16.0*  --   --  23.8*  --  27.2*  LATICACIDVEN  --  >11.0* 3.9* 2.7* 2.6*  --     Liver Function Tests: Recent Labs  Lab 11/30/19 1226  AST 37  ALT 14  ALKPHOS 94  BILITOT 2.0*  PROT 5.8*  ALBUMIN 1.6*   No results for input(s): LIPASE, AMYLASE in the last 168 hours. No results for input(s): AMMONIA in the last 168 hours.  ABG    Component Value Date/Time   PHART 7.448 11/30/2019 2115   PCO2ART 31.1 (L) 11/30/2019 2115   PO2ART 56 (L) 11/30/2019 2115   HCO3 21.5 11/30/2019 2115   TCO2 22 11/30/2019 2115   ACIDBASEDEF 2.0 11/30/2019 2115   O2SAT 90.0 11/30/2019 2115     Coagulation Profile: Recent Labs  Lab 11/30/19 1226  INR 1.7*    Cardiac Enzymes: No results for input(s): CKTOTAL, CKMB, CKMBINDEX, TROPONINI in the last 168 hours.  HbA1C: Hgb A1c MFr Bld  Date/Time Value Ref Range Status  11/04/2019  04:34 AM 6.3 (H) 4.8 - 5.6 % Final    Comment:    (NOTE)         Prediabetes: 5.7 - 6.4         Diabetes: >6.4         Glycemic control for adults with diabetes: <7.0     CBG: No results for input(s): GLUCAP in the last 168 hours.  Review of Systems:   Head hurts Whole body hurts Lost weight Generalized weakness Dizziness No nausea/vomiting/diarrhea No hematemesis/melena   Past Medical History  She,  has a past medical history of Heroin abuse (HCC), Kidney stones, MRSA (methicillin resistant staph aureus) culture positive, Ovarian cyst, and UTI (lower urinary tract infection).  Surgical History    Past Surgical History:  Procedure Laterality Date  . FINGER SURGERY    . TEE WITHOUT CARDIOVERSION N/A 11/07/2019   Procedure: TRANSESOPHAGEAL ECHOCARDIOGRAM (TEE);  Surgeon: Wendall Stade, MD;  Location[facility]COPY;  Service: Cardiovascular;  Laterality: N/A;     Social History   reports that she has quit smoking. Her smoking use included cigarettes. She has never used smokeless tobacco. She reports current drug use. Drugs: Marijuana and IV. She reports that she does not drink alcohol.   Family History   Her family history includes Valvular heart disease in her maternal grandmother.   Allergies Allergies  Allergen Reactions  . Bee Venom Anaphylaxis     Home Medications  Prior to Admission medications   Medication Sig Start Date End Date Taking? Authorizing Provider  acetaminophen (TYLENOL) 500 MG tablet Take 1,000 mg by mouth every 6 (six) hours as needed for headache.    Yes [provider]  ibuprofen (ADVIL) 600 MG tablet Take 1 tablet (600 mg total) by mouth every 6 (six) hours as needed. Patient not taking: Reported on 11/03/2019 08/20/18   Fayrene Helper, PA-C  ondansetron (ZOFRAN ODT) 4 MG disintegrating tablet Take 1 tablet (4 mg total) by mouth every 8 (eight) hours as needed for nausea or vomiting. Patient not taking: Reported on 11/03/2019 01/11/19    Liberty Handy, PA-C     [facility] PGY-1 12/02/19 7:44 AM

## 2019-12-02 NOTE — Progress Notes (Signed)
PHARMACY - PHYSICIAN COMMUNICATION CRITICAL VALUE ALERT - BLOOD CULTURE IDENTIFICATION (BCID)  Amber [name] is an 29 y.o. female who presented to [facility] on 11/30/2019 with a chief complaint of MRSA bacteremia/endocarditis   Assessment:  Repeat blood cultures remain positive for MRSA  Name of physician (or Provider) Contacted: Dr. Warrick Parisian   Current antibiotics: Vancomycin/Zosyn  Changes to prescribed antibiotics recommended:  Continue vancomycin  DC Zosyn  Results for orders placed or performed during the hospital encounter of 11/30/19  Blood Culture ID Panel (Reflexed) (Collected: 11/30/2019 12:28 PM)  Result Value Ref Range   Enterococcus faecalis NOT DETECTED NOT DETECTED   Enterococcus Faecium NOT DETECTED NOT DETECTED   Listeria monocytogenes NOT DETECTED NOT DETECTED   Staphylococcus species DETECTED (A) NOT DETECTED   Staphylococcus aureus (BCID) DETECTED (A) NOT DETECTED   Staphylococcus epidermidis NOT DETECTED NOT DETECTED   Staphylococcus lugdunensis NOT DETECTED NOT DETECTED   Streptococcus species NOT DETECTED NOT DETECTED   Streptococcus agalactiae NOT DETECTED NOT DETECTED   Streptococcus pneumoniae NOT DETECTED NOT DETECTED   Streptococcus pyogenes NOT DETECTED NOT DETECTED   A.calcoaceticus-baumannii NOT DETECTED NOT DETECTED   Bacteroides fragilis NOT DETECTED NOT DETECTED   Enterobacterales NOT DETECTED NOT DETECTED   Enterobacter cloacae complex NOT DETECTED NOT DETECTED   Escherichia coli NOT DETECTED NOT DETECTED   Klebsiella aerogenes NOT DETECTED NOT DETECTED   Klebsiella oxytoca NOT DETECTED NOT DETECTED   Klebsiella pneumoniae NOT DETECTED NOT DETECTED   Proteus species NOT DETECTED NOT DETECTED   Salmonella species NOT DETECTED NOT DETECTED   Serratia marcescens NOT DETECTED NOT DETECTED   Haemophilus influenzae NOT DETECTED NOT DETECTED   Neisseria meningitidis NOT DETECTED NOT DETECTED   Pseudomonas aeruginosa NOT DETECTED NOT DETECTED    Stenotrophomonas maltophilia NOT DETECTED NOT DETECTED   Candida albicans NOT DETECTED NOT DETECTED   Candida auris NOT DETECTED NOT DETECTED   Candida glabrata NOT DETECTED NOT DETECTED   Candida krusei NOT DETECTED NOT DETECTED   Candida parapsilosis NOT DETECTED NOT DETECTED   Candida tropicalis NOT DETECTED NOT DETECTED   Cryptococcus neoformans/gattii NOT DETECTED NOT DETECTED   Meth resistant mecA/C and MREJ DETECTED (A) NOT DETECTED    Abran Duke 12/02/2019  2:35 AM

## 2019-12-02 NOTE — TOC Initial Note (Signed)
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 Has signed out AMA previously. For angiovac by [facility] Dr Cliffton Asters. Patient currently staying with friends,  Has mother as prime support. CM will follow for needs.  Expected Discharge Plan: Home/Self Care Barriers to Discharge: Continued Medical Work up   Patient Goals and CMS Choice        Expected Discharge Plan and Services Expected Discharge Plan: Home/Self Care   Discharge Planning Services: CM Consult                                          Prior Living Arrangements/Services   Lives with:: Friends Patient language and need for interpreter reviewed:: Yes        Need for Family Participation in Patient Care: Yes (Comment) Care giver support system in place?: Yes (comment)   Criminal Activity/Legal Involvement Pertinent to Current Situation/Hospitalization: No - Comment as needed  Activities of Daily Living      Permission Sought/Granted                  Emotional Assessment       Orientation: : Oriented to Self, Oriented to Place, Oriented to  Time, Oriented to Situation Alcohol / Substance Use: Illicit Drugs Psych Involvement: No (comment)  Admission diagnosis:  Septic shock (HCC) [A41.9, R65.21] Patient Active Problem List   Diagnosis Date Noted  . Septic shock (HCC) 11/30/2019  . Septic embolism (HCC) 11/05/2019  . AKI (acute kidney injury) (HCC) 11/05/2019  . Hyponatremia 11/05/2019  . Cellulitis of left foot 11/05/2019  . 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 Bacterial endocarditis 11/05/2019  . Acute bacterial endocarditis   . MRSA bacteremia   . Sepsis (HCC)  11/03/2019  . Abscess of face 08/20/2018  . Facial cellulitis 11/21/2017  . Depression   . IV drug abuse (HCC)   . Suicidal ideation    PCP:  Patient, No Pcp Per Pharmacy:   [facility] [address] [place] - [address] [place] [address] Phone: [phone] Fax: [phone]     Social Determinants of Health (SDOH) Interventions    Readmission Risk Interventions No flowsheet data found.

## 2019-12-02 NOTE — Progress Notes (Signed)
eLink Physician-Brief Progress Note Patient Name: Amber [name] DOB: 1991/01/10 MRN: [id number]   Date of Service  12/02/2019  HPI/Events of Note  Patient with area of left forearm phlebitis secondary to infiltration of iv with LR infusing.  eICU Interventions  Elevate extremity and apply warm compress to affected area.        Thomasene Lot Bentzion Dauria 12/02/2019, 9:21 PM

## 2019-12-02 NOTE — Progress Notes (Signed)
Pharmacy Antibiotic Note  Amber [name] is a 29 y.o. female admitted on 11/30/2019 with sepsis and endocarditis.  The patient has known PMH of IVDU and known tricuspid MRSA endocarditis who previously left AMA x2. Pharmacy has been consulted for vancomycin dosing. Current renal function is 26ml/min with downtrended Scr of 0.81.   Pt has previously received vancomycin therapy (in 10/2019) with Scr 0.55 and 48.7kg with resulting CrCl of 33ml/min. She was receiving vancomycin 750mg  IV q12h and had a resultant vancomycin trough of 30mcg/ml (subtherapeutic). Therefore, will proceed with empiric dose adjustment with recovery of renal function  Plan: Increase vancomycin dose to 750mg  IV q8h (goal trough 15-20mcg/ml) Monitor renal function, CBC, clinical progress, and vancomycin trough as needed  Weight: 51.1 kg (112 lb 10.5 oz)  Temp (24hrs), Avg:98.1 F (36.7 C), Min:96.7 F (35.9 C), Max:99.5 F (37.5 C)  Recent Labs  Lab 11/30/19 1226 11/30/19 1228 12/01/19 0006 12/01/19 0405 12/01/19 0857 12/02/19 0129  WBC 16.0*  --   --  23.8*  --  27.2*  CREATININE 1.39*  --   --  0.97  --  0.81  LATICACIDVEN  --  >11.0* 3.9* 2.7* 2.6*  --     Estimated Creatinine Clearance: 81.1 mL/min (by C-G formula based on SCr of 0.81 mg/dL).    Allergies  Allergen Reactions  . Bee Venom Anaphylaxis    Antimicrobials this admission: 11/17 vancomycin>> 11/17 piperacillin/tazobactam>>11/18  Microbiology results: 11/17 BCx: NG 24h 11/17 Respiratory panel: negative  11/17 MRSA PCR: positive Thank you for allowing pharmacy to be a part of this patient's care.  12/17  PGY1 pharmacy resident 12/02/2019 11:28 AM

## 2019-12-02 NOTE — Progress Notes (Signed)
On assessment, left forearm infiltration appears worse than last night. Increased redness and bruising, and new blister formation noted. Area of infiltration outlined, and Elink notified at 2030.

## 2019-12-02 NOTE — Consult Note (Signed)
Consultation Note Date: 12/02/2019   Patient Name: Amber [name]  DOB: 02-Apr-1990  MRN: [id number]  Age / Sex: 29 y.o., female  PCP: Patient, No Pcp Per Referring Physician: Spero Geralds, MD  Reason for Consultation: Establishing goals of care  HPI/Patient Profile: 29 y.o. female  with past medical history of heroin use, MRSA bacteremia, tricuspid endocarditis with pulmonary septic emboli who left AMA twice during the week of October 25 , 2021 was admitted on 11/30/2019 with worsening weakness.  She was found to be in septic shock with acute [name] injury, and severe thrombocytopenia.    Patient's tricuspid valve vegetation has worsened since her 10/22 echo.  11/17 echo showed greater than 1 cm vegetation on the tricuspid valve, with a slightly smaller anterior leaflet vegetation. There is lack of coaptation and severe regurgitation.   Tricuspid valve regurgitation is severe.  As patient has an on-going addiction problem she is not currently a candidate for valve replacement surgery.  Clinical Assessment and Goals of Care:  I have reviewed medical records including EPIC notes, labs and imaging, received report from the care team, examined the patient and met at bedside with her mother Amber [name]  to discuss diagnosis prognosis, Hope, EOL wishes, disposition and options.  I introduced Palliative Medicine as specialized medical care for people living with serious illness. It focuses on providing relief from the symptoms and stress of a serious illness.   Franceska is currently resting and only wakes to moan occasionally during our conversation.  We discussed a brief life review of the patient. Maribelle was raised in [place].  Her mother describes her as bubbly, very kind and extroverted. She has 1 sister, is not married and has no children.  Prior to her heroin addiction she loved scuba diving and would go on trips  outside of the country to scuba dive. 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  Per Amber [name] (mother) Esmee's father has never been in the picture and they wouldn't know where to reach him.  Her support network is the patient's sister, mother and Aunt.  Amber [name] explains that Chanteria was raised in [place] and believes in God.    About 12 years ago she became involved with a group of friends who used heroin and she began to use.  Amber [name] describes a very difficult life since.  Maricel is currently homeless.  Amber [name] stopped supporting her in 2017.   Amber [name] explains that after this hospitalization and rehab her hope is that Karson will come live with her until she's back on her feet.  Amber [name] has two small children at home that she is currently raising and plans to adopt.  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  Amber [name] expresses deep concern over Camrie's serious illness.  She seems to grasp how serious MRSA bacteremia and Tricuspid endocarditis is and she feels Porchia really realizes how sick she is this time as well.  Amber [name] feels that Haena left 2x AMA in October  partially because she could not tolerate her severe withdraw symptoms.  Mary comments that Amandy can be quite loud and active when she is going thru withdraw.  I attempted to elicit values and goals of care important to the patient.  Amber [name] explains that Deari wants to live, but right now she does not want to live if she is not high.  Amber [name] is very hopeful that that can change.    Provided an opportunity to [place] to express her concerns and offered support.  Questions and concerns were addressed.  The family was encouraged to call with questions or concerns.     Primary Decision Maker:  NEXT OF KIN Mother, Amber [name]    SUMMARY OF RECOMMENDATIONS    PMT introductory meeting completed. PMT will follow along with you intermittently.  Please call if we are needed more urgently.  Code Status/Advance Care Planning:  Full  code   Symptom Management:   Per primary.  Additional Recommendations (Limitations, Scope, Preferences):  Full Scope Treatment  Palliative Prophylaxis:   Frequent assessment for withdraw symptoms.  Psycho-social/Spiritual:   Desire for further Chaplaincy support: would be welcomed.  Prognosis:   Unable to determine.    Discharge Planning: To Be Determined      Primary Diagnoses: Present on Admission: . Septic shock ([place])   I have reviewed the medical record, interviewed the patient and family, and examined the patient. The following aspects are pertinent.  Past Medical History:  Diagnosis Date  . Heroin abuse ([place])   . [name] stones   . MRSA (methicillin resistant staph aureus) culture positive   . Ovarian cyst   . UTI (lower urinary tract infection)    Social History   Socioeconomic History  . Marital status: Single    Spouse name: Not on file  . Number of children: Not on file  . Years of education: Not on file  . Highest education level: Not on file  Occupational History  . Not on file  Tobacco Use  . Smoking status: Former Smoker    Types: Cigarettes  . Smokeless tobacco: Never Used  Vaping Use  . Vaping Use: Never used  Substance and Sexual Activity  . Alcohol use: No  . Drug use: Yes    Types: Marijuana, IV    Comment: opiods- pain medication/heroin  . Sexual activity: Yes    Birth control/protection: None  Other Topics Concern  . Not on file  Social History Narrative  . Not on file   Social Determinants of Health   Financial Resource Strain:   . Difficulty of Paying Living Expenses: Not on file  Food Insecurity:   . Worried About Charity fundraiser in the Last Year: Not on file  . Ran Out of Food in the Last Year: Not on file  Transportation Needs:   . Lack of Transportation (Medical): Not on file  . Lack of Transportation (Non-Medical): Not on file  Physical Activity:   . Days of Exercise per Week: Not on file  . Minutes of  Exercise per Session: Not on file  Stress:   . Feeling of Stress : Not on file  Social Connections:   . Frequency of Communication with Friends and Family: Not on file  . Frequency of Social Gatherings with Friends and Family: Not on file  . Attends Religious Services: Not on file  . Active Member of Clubs or Organizations: Not on file  . Attends Archivist Meetings: Not on file  . Marital  Status: Not on file   Family History  Problem Relation Age of Onset  . Valvular heart disease Maternal Grandmother     Allergies  Allergen Reactions  . Bee Venom Anaphylaxis     Vital Signs: BP 95/64   Pulse (!) 106   Temp (!) 97.5 F (36.4 C) (Oral)   Resp (!) 30   Wt 51.1 kg   SpO2 98%   BMI 20.60 kg/m  Pain Scale: 0-10   Pain Score: 4    SpO2: SpO2: 98 % O2 Device:SpO2: 98 % O2 Flow Rate: .O2 Flow Rate (L/min): 2 L/min    Time In: 3:00 Time Out: 4:00 Time Total: 60 min. Visit consisted of counseling and education dealing with the complex and emotionally intense issues surrounding the need for palliative care and symptom management in the setting of serious and potentially life-threatening illness. Greater than 50%  of this time was spent counseling and coordinating care related to the above assessment and plan.  Signed by: Florentina Jenny, PA-C Palliative Medicine  Please contact Palliative Medicine Team phone at [phone] for questions and concerns.  For individual provider: See Shea Evans

## 2019-12-02 NOTE — Consult Note (Addendum)
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   H/o RT leg ORIF in the past  - exteriorly looks OK  HCV ab positive in 11/04/19   Recommendations  -Continue Vancomycin, pharmacy to dose. Reasonable to DC Zosyn given known MRSA bacteremia -CT surgery has been consulted - Plan for OR possible next Monday  -FU TEE from today  -Repeat 2 sets of blood cultures today  -Monitor BMP and vancomycin trough  -Check HCV RNA -Following  Dr Luciana Axe ( pager [phone])  is available this weekend with ID questions or concerns    Rest of the management as per the primary team. Thank you for the consult. Please page with pertinent questions or concerns.  Odette Fraction, MD Infectious Diseases  [facility]   To contact the attending provider between 8A-5P or the covering provider during after hours 5P-8A, please log into the web site [email or web address] and access using universal [place] password for that web site. If you do not have the password, please call the hospital operator. __________________________________________________________________________________________________________ HPI and Hospital Course: Amber [name] is a 29 y.o. female w/ hx of incompletely treated MRSA bacteremia, TV endocarditis c/b septic emboli (Oct 2021 hospitalization) and active IVDU  presented to the ED with weakness and dyspnea.History is limited from patient as patient is very drowsy with generalised weakness/malaise and fatigue. Spoke with mother at bedside who says that patient has been using IV heroin and fentanyl on a daily basis since leaving the hospital AMA at the end of October. Her mother received a call from patient's friend that her daughter has generalised weakness/ excessive SOB and unable to walk.  Of note, she was recently hospitalised and left AMA after being found to have TV endocarditis with MRSA complicated with septic pulmonary emboli without completing treatment course. In ED, afebrile with WBC upto 23. Tachycardic and tachypneic/  With hypotension. Blood cx 1/2 sets MRSA.   ROS: limited due to patient's clinical condition.  Past Medical History:  Diagnosis Date  . Heroin abuse (HCC)   . Kidney stones   . MRSA (methicillin resistant staph aureus) culture positive   . Ovarian cyst   . UTI (lower urinary tract infection)    Past Surgical History:  Procedure Laterality Date  . FINGER SURGERY    . TEE WITHOUT CARDIOVERSION N/A 11/07/2019   Procedure: TRANSESOPHAGEAL ECHOCARDIOGRAM (TEE);  Surgeon: Wendall Stade, MD;  Location[facility] ENDOSCOPY;  Service: Cardiovascular;  Laterality: N/A;    Scheduled Meds: . Chlorhexidine Gluconate Cloth  6 each Topical Daily  . docusate sodium  100 mg Oral BID  . enoxaparin (LOVENOX) injection  30 mg Subcutaneous Q24H  . mouth rinse  15 mL Mouth Rinse BID  . methadone  5 mg Oral Q8H  . mupirocin ointment  1 application Nasal BID  . polyethylene glycol  17 g Oral Daily   Continuous Infusions: . sodium chloride 10 mL/hr at 12/02/19 1100  . sodium chloride    . ketamine (KETALAR) Adult IV Infusion    . vancomycin     PRN Meds:.Place/Maintain arterial line **AND** sodium chloride, acetaminophen, HYDROmorphone (DILAUDID) injection, LORazepam, ondansetron (ZOFRAN) IV, oxyCODONE-acetaminophen  Allergies  Allergen  Reactions  . Bee Venom Anaphylaxis   Social History   Socioeconomic History  . Marital status: Single    Spouse name: Not on file  . Number of children: Not on file  . Years of education: Not on file  . Highest education level: Not on file  Occupational History  . Not on file  Tobacco Use  . Smoking status: Former Smoker    Types: Cigarettes  . Smokeless tobacco: Never Used  Vaping Use  . Vaping Use: Never used  Substance and Sexual Activity  . Alcohol use: No  . Drug use: Yes    Types: Marijuana, IV    Comment: opiods- pain medication/heroin  . Sexual activity: Yes    Birth control/protection: None  Other Topics Concern  . Not on file  Social History Narrative  . Not on file   Social Determinants of Health   Financial Resource Strain:   . Difficulty of Paying Living Expenses: Not on file  Food Insecurity:   . Worried About Programme researcher, broadcasting/film/videounning Out of Food in the Last Year: Not on file  . Ran Out of Food in the Last Year: Not on file  Transportation Needs:   . Lack of Transportation (Medical): Not on file  . Lack of Transportation (Non-Medical): Not on file  Physical Activity:   . Days of Exercise per Week: Not on file  . Minutes of Exercise per Session: Not on file  Stress:   . Feeling of Stress : Not on file  Social Connections:   . Frequency of Communication with Friends and Family: Not on file  . Frequency of Social Gatherings with Friends and Family: Not on file  . Attends Religious Services: Not on file  . Active Member of Clubs or Organizations: Not on file  . Attends BankerClub or Organization Meetings: Not on file  . Marital Status: Not on file  Intimate Partner Violence:   . Fear of Current or Ex-Partner: Not on file  . Emotionally Abused: Not on file  . Physically Abused: Not on file  . Sexually Abused: Not on file   Vitals BP 95/64   Pulse (!) 108   Temp (!) 97.5 F (36.4 C) (Oral)   Resp (!) 41   Wt 51.1 kg   SpO2 95%   BMI 20.60 kg/m    Physical  Exam Constitutional:  lying in bed with eyes closed, arousable to voice    Comments: fatigues/lethargic  Cardiovascular:     Rate and Rhythm: Normal rate and regular rhythm.     Heart sounds: Grade 4 systolic murmur +  Pulmonary:     Effort: on nasal cannula, mild respiratory distress    Comments: coarse breath sound anteriorly  Abdominal:     Palpations: Abdomen is soft. BS+    Tenderness: Non tender   Musculoskeletal:        General: No obvious swelling  of joints but diffuse tenderness +  Skin:    Comments: No obvious rashes   Neurological:     General:unable to examine  Psychiatric:        Mood and Affect: Lethargic    LINES/TUBES: PIVs and art line   METAL IMPLANT/HARDWARE: Rt leg ORIF  Pertinent Microbiology Results for orders placed or performed during the hospital encounter of 11/30/19  Blood Culture (routine x 2)     Status: None (Preliminary result)   Collection Time: 11/30/19 12:28 PM   Specimen: BLOOD  Result Value Ref Range Status   Specimen Description BLOOD CTC  Final   Special Requests   Final    AEROBIC BOTTLE ONLY Blood Culture results may not be optimal due to an inadequate volume of blood received in culture bottles   Culture  Setup Time   Final    AEROBIC BOTTLE ONLY GRAM POSITIVE COCCI Organism ID to follow CRITICAL RESULT CALLED TO, READ BACK BY AND VERIFIED WITHMelven Sartorius [facility] 12/02/19 0211 JDW Performed at [facility], [address], [place], [place] [address]    Culture GRAM POSITIVE COCCI  Final   Report Status PENDING  Incomplete  Blood Culture ID Panel (Reflexed)     Status: Abnormal   Collection Time: 11/30/19 12:28 PM  Result Value Ref Range Status   Enterococcus faecalis NOT DETECTED NOT DETECTED Final   Enterococcus Faecium NOT DETECTED NOT DETECTED Final   Listeria monocytogenes NOT DETECTED NOT DETECTED Final   Staphylococcus species DETECTED (A) NOT DETECTED Final    Comment: CRITICAL RESULT CALLED TO, READ BACK BY  AND VERIFIED WITH: J LEDFORD PHARMD 12/02/19 0211 JDW    Staphylococcus aureus (BCID) DETECTED (A) NOT DETECTED Final    Comment: Methicillin (oxacillin)-resistant Staphylococcus aureus (MRSA). MRSA is predictably resistant to beta-lactam antibiotics (except ceftaroline). Preferred therapy is vancomycin unless clinically contraindicated. Patient requires contact precautions if  hospitalized. CRITICAL RESULT CALLED TO, READ BACK BY AND VERIFIED WITH: J LEDFORD [facility] 12/02/19 0211 JDW    Staphylococcus epidermidis NOT DETECTED NOT DETECTED Final   Staphylococcus lugdunensis NOT DETECTED NOT DETECTED Final   Streptococcus species NOT DETECTED NOT DETECTED Final   Streptococcus agalactiae NOT DETECTED NOT DETECTED Final   Streptococcus pneumoniae NOT DETECTED NOT DETECTED Final   Streptococcus pyogenes NOT DETECTED NOT DETECTED Final   A.calcoaceticus-baumannii NOT DETECTED NOT DETECTED Final   Bacteroides fragilis NOT DETECTED NOT DETECTED Final   Enterobacterales NOT DETECTED NOT DETECTED Final   Enterobacter cloacae complex NOT DETECTED NOT DETECTED Final   Escherichia coli NOT DETECTED NOT DETECTED Final   Klebsiella aerogenes NOT DETECTED NOT DETECTED Final   Klebsiella oxytoca NOT DETECTED NOT DETECTED Final   Klebsiella pneumoniae NOT DETECTED NOT DETECTED Final   Proteus species NOT DETECTED NOT DETECTED Final   Salmonella species NOT DETECTED NOT DETECTED Final   Serratia marcescens NOT DETECTED NOT DETECTED Final   Haemophilus influenzae NOT DETECTED NOT DETECTED Final   Neisseria meningitidis NOT DETECTED NOT DETECTED Final   Pseudomonas aeruginosa NOT DETECTED NOT DETECTED Final   Stenotrophomonas maltophilia NOT DETECTED NOT DETECTED Final   Candida albicans NOT DETECTED NOT DETECTED Final   Candida auris NOT DETECTED NOT DETECTED Final   Candida glabrata NOT DETECTED NOT DETECTED Final   Candida krusei NOT DETECTED NOT DETECTED Final   Candida parapsilosis NOT DETECTED  NOT DETECTED Final   Candida tropicalis NOT DETECTED NOT DETECTED Final   Cryptococcus neoformans/gattii NOT DETECTED NOT DETECTED Final  Meth resistant mecA/C and MREJ DETECTED (A) NOT DETECTED Final    Comment: CRITICAL RESULT CALLED TO, READ BACK BY AND VERIFIED WITH: Melven Sartorius [facility] 12/02/19 0211 JDW Performed at [facility], [address] [place], [place] [address]   Respiratory Panel by RT PCR (Flu A&B, Covid) - Nasopharyngeal Swab     Status: None   Collection Time: 11/30/19 12:53 PM   Specimen: Nasopharyngeal Swab  Result Value Ref Range Status   SARS Coronavirus 2 by RT PCR NEGATIVE NEGATIVE Final    Comment: (NOTE) SARS-CoV-2 target nucleic acids are NOT DETECTED.  The SARS-CoV-2 RNA is generally detectable in upper respiratoy specimens during the acute phase of infection. The lowest concentration of SARS-CoV-2 viral copies this assay can detect is 131 copies/mL. A negative result does not preclude SARS-Cov-2 infection and should not be used as the sole basis for treatment or other patient management decisions. A negative result may occur with  improper specimen collection/handling, submission of specimen other than nasopharyngeal swab, presence of viral mutation(s) within the areas targeted by this assay, and inadequate number of viral copies (<131 copies/mL). A negative result must be combined with clinical observations, patient history, and epidemiological information. The expected result is Negative.  Fact Sheet for Patients:  https://www.moore.com/  Fact Sheet for Healthcare Providers:  https://www.young.biz/  This test is no t yet approved or cleared by the [place] FDA and  has been authorized for detection and/or diagnosis of SARS-CoV-2 by FDA under an Emergency Use Authorization (EUA). This EUA will remain  in effect (meaning this test can be used) for the duration of the COVID-19 declaration under Section  564(b)(1) of the Act, 21 U.S.C. section 360bbb-3(b)(1), unless the authorization is terminated or revoked sooner.     Influenza A by PCR NEGATIVE NEGATIVE Final   Influenza B by PCR NEGATIVE NEGATIVE Final    Comment: (NOTE) The Xpert Xpress SARS-CoV-2/FLU/RSV assay is intended as an aid in  the diagnosis of influenza from Nasopharyngeal swab specimens and  should not be used as a sole basis for treatment. Nasal washings and  aspirates are unacceptable for Xpert Xpress SARS-CoV-2/FLU/RSV  testing.  Fact Sheet for Patients: https://www.moore.com/  Fact Sheet for Healthcare Providers: https://www.young.biz/  This test is not yet approved or cleared by the [place] FDA and  has been authorized for detection and/or diagnosis of SARS-CoV-2 by  FDA under an Emergency Use Authorization (EUA). This EUA will remain  in effect (meaning this test can be used) for the duration of the  Covid-19 declaration under Section 564(b)(1) of the Act, 21  U.S.C. section 360bbb-3(b)(1), unless the authorization is  terminated or revoked. Performed at [facility], [address] [place], [place] [address]   MRSA PCR Screening     Status: Abnormal   Collection Time: 11/30/19  4:58 PM   Specimen: Nasopharyngeal  Result Value Ref Range Status   MRSA by PCR POSITIVE (A) NEGATIVE Final    Comment:        The GeneXpert MRSA Assay (FDA approved for NASAL specimens only), is one component of a comprehensive MRSA colonization surveillance program. It is not intended to diagnose MRSA infection nor to guide or monitor treatment for MRSA infections. RESULT CALLED TO, READ BACK BY AND VERIFIED WITH: Brooke Dare RN 11/30/19 AT 2231 SK Performed at [facility], [address] [place], [place] [address]   Blood Culture (routine x 2)     Status: None (Preliminary  result)   Collection Time: 11/30/19  5:46 PM   Specimen: BLOOD RIGHT HAND  Result Value  Ref Range Status   Specimen Description BLOOD RIGHT HAND  Final   Special Requests   Final    BOTTLES DRAWN AEROBIC AND ANAEROBIC Blood Culture results may not be optimal due to an inadequate volume of blood received in culture bottles   Culture   Final    NO GROWTH < 24 HOURS Performed at [facility], [address] [place], [place] [address]    Report Status PENDING  Incomplete    Pertinent Lab seen by me: CBC Latest Ref Rng & Units 12/02/2019 12/01/2019 11/30/2019  WBC 4.0 - 10.5 K/uL 27.2(H) 23.8(H) -  Hemoglobin 12.0 - 15.0 g/dL 0.2(R) 4.2(H) 10.2(L)  Hematocrit 36 - 46 % 28.7(L) 27.8(L) 30.0(L)  Platelets 150 - 400 K/uL 53(L) 60(L) -   CMP Latest Ref Rng & Units 12/02/2019 12/01/2019 11/30/2019  Glucose 70 - 99 mg/dL 97 062(B) -  BUN 6 - 20 mg/dL 76(E) 83(T) -  Creatinine 0.44 - 1.00 mg/dL 5.17 6.16 -  Sodium 073 - 145 mmol/L 136 132(L) 134(L)  Potassium 3.5 - 5.1 mmol/L 3.5 3.6 3.1(L)  Chloride 98 - 111 mmol/L 103 100 -  CO2 22 - 32 mmol/L 21(L) 20(L) -  Calcium 8.9 - 10.3 mg/dL 7.6(L) 7.6(L) -  Total Protein 6.5 - 8.1 g/dL - - -  Total Bilirubin 0.3 - 1.2 mg/dL - - -  Alkaline Phos 38 - 126 U/L - - -  AST 15 - 41 U/L - - -  ALT 0 - 44 U/L - - -    Pertinent Imagings/Other Imagings Plain films and CT images have been personally visualized and interpreted; radiology reports have been reviewed. Decision making incorporated into the Impression / Recommendations.  Chest Xray 11/30/19  FINDINGS: Persistent bilateral nodular opacities reflecting septic emboli seen on prior chest CT. No pleural effusion or pneumothorax. Cardiomediastinal contours are within normal limits.  IMPRESSION: Persistent bilateral nodular opacities reflecting septic emboli seen on prior chest CT.  TTE 11/30/19   1. Large, greater than 1 cm, vegetation on the tricuspid valve; appears to predominante on the septal and posterior leaflets, with a slightly smaller anterior leafelt  vegetation. There is lack of coaptation and severe regurgitation. Largest dimensions through study 2.36 cm X 1.24cm. The tricuspid valve is abnormal. Tricuspid valve regurgitation is severe. 2. Left ventricular ejection fraction, by estimation, is 65 to 70%. Left ventricular ejection fraction by PLAX is 70 %. The left ventricle has normal function. The left ventricle has no regional wall motion abnormalities. Left ventricular diastolic parameters were normal. 3. Right ventricular systolic function is hyperdynamic. The right ventricular size is normal. There is normal pulmonary artery systolic pressure. 4. The mitral valve is grossly normal. No evidence of mitral valve regurgitation. 5. The aortic valve is grossly normal. Aortic valve regurgitation is not visualized. 6. The inferior vena cava is dilated in size with <50% respiratory variability, suggesting right atrial pressure of 15 mmHg. Comparison(s): No prior Echocardiogram. Conclusion(s)/Recommendation(s): Evidence of infective endocarditis   I have spent greater than 45 minutes for this patient encounter including review of prior medical records with greater than 50% of time being face to face and coordination of their care.

## 2019-12-02 NOTE — Progress Notes (Signed)
K+ 3.5  Replaced per protocol  

## 2019-12-03 DIAGNOSIS — F1193 Opioid use, unspecified with withdrawal: Secondary | ICD-10-CM

## 2019-12-03 DIAGNOSIS — I079 Rheumatic tricuspid valve disease, unspecified: Secondary | ICD-10-CM

## 2019-12-03 LAB — COMPREHENSIVE METABOLIC PANEL
ALT: 99 U/L — ABNORMAL HIGH (ref 0–44)
AST: 416 U/L — ABNORMAL HIGH (ref 15–41)
Albumin: 1.4 g/dL — ABNORMAL LOW (ref 3.5–5.0)
Alkaline Phosphatase: 129 U/L — ABNORMAL HIGH (ref 38–126)
Anion gap: 9 (ref 5–15)
BUN: 18 mg/dL (ref 6–20)
CO2: 21 mmol/L — ABNORMAL LOW (ref 22–32)
Calcium: 7.6 mg/dL — ABNORMAL LOW (ref 8.9–10.3)
Chloride: 105 mmol/L (ref 98–111)
Creatinine, Ser: 0.61 mg/dL (ref 0.44–1.00)
GFR, Estimated: >60 mL/min (ref >60)
Glucose, Bld: 93 mg/dL (ref 70–99)
Potassium: 3.5 mmol/L (ref 3.5–5.1)
Sodium: 135 mmol/L (ref 135–145)
Total Bilirubin: 1.7 mg/dL — ABNORMAL HIGH (ref 0.3–1.2)
Total Protein: 5.3 g/dL — ABNORMAL LOW (ref 6.5–8.1)

## 2019-12-03 LAB — VANCOMYCIN, TROUGH: Vancomycin Tr: 19 ug/mL (ref 15–20)

## 2019-12-03 LAB — GLUCOSE, CAPILLARY
Glucose-Capillary: 85 mg/dL (ref 70–99)
Glucose-Capillary: 109 mg/dL — ABNORMAL HIGH (ref 70–99)
Glucose-Capillary: 95 mg/dL (ref 70–99)
Glucose-Capillary: 98 mg/dL (ref 70–99)

## 2019-12-03 MED ORDER — POTASSIUM CHLORIDE CRYS ER 20 MEQ PO TBCR
40.0000 meq | EXTENDED_RELEASE_TABLET | Freq: Once | ORAL | Status: AC
  Administered 2019-12-03: 40 meq via ORAL
  Filled 2019-12-03: qty 2

## 2019-12-03 MED ORDER — ORAL CARE MOUTH RINSE
15.0000 mL | Freq: Two times a day (BID) | OROMUCOSAL | Status: DC
  Administered 2019-12-04 – 2020-01-14 (×49): 15 mL via OROMUCOSAL

## 2019-12-03 MED ORDER — ORAL CARE MOUTH RINSE: 15.0000 mL | OROMUCOSAL | Status: DC

## 2019-12-03 MED ORDER — CHLORHEXIDINE GLUCONATE 0.12 % MT SOLN
15.0000 mL | Freq: Two times a day (BID) | OROMUCOSAL | Status: DC
  Administered 2019-12-03 – 2020-01-16 (×74): 15 mL via OROMUCOSAL
  Filled 2019-12-03 (×69): qty 15

## 2019-12-03 MED ORDER — CHLORHEXIDINE GLUCONATE 0.12% ORAL RINSE (MEDLINE KIT): 15.0000 mL | Freq: Two times a day (BID) | OROMUCOSAL | Status: DC

## 2019-12-03 MED ORDER — HYDROCODONE-HOMATROPINE 5-1.5 MG/5ML PO SYRP
5.0000 mL | ORAL_SOLUTION | ORAL | Status: DC | PRN
  Administered 2019-12-03 – 2019-12-23 (×43): 5 mL via ORAL
  Filled 2019-12-03 (×46): qty 5

## 2019-12-03 NOTE — Progress Notes (Signed)
K+ 3.5  Replaced per protocol  

## 2019-12-03 NOTE — Progress Notes (Signed)
NAME:  Amber [name], MRN:  [id number], DOB:  1990-11-06, LOS: 3 ADMISSION DATE:  11/30/2019, CONSULTATION DATE:  12/03/2019  REFERRING MD:  Lysle Morales, EDP, CHIEF COMPLAINT: Generalized weakness, hypotension  Brief History   29 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  Seen in consultation by cardiology, cardiothoracic surgery and infectious disease.  Not felt to require surgery/also not a candidate given ongoing IVDU.  She signed out AMA.  Has been living with her best friend since. Mom reports ongoing IVDU.  She had to be removed from her vehicle due to lethargy, needles noted on her arm by triage nurse.  She reports progressive weakness and pain in her sides.  Hypotensive in the ED, received 2 L of fluids and empiric Zosyn/vancomycin.  Past Medical History  IVDU  Significant Hospital Events   10/23-26 hospital admit for endocarditis, signed out AMA  Consults:  CT surgery Infectious disease  Procedures:    Significant Diagnostic Tests:  CXR 11/17: Persistent bilateral nodular opacities reflecting septic emboli seen on prior chest CT. Echo 11/17 >> 2.36 cm X 1.24c vegetation on the tricuspid valve; predominantely on the septal and posterior leaflets, with severe regurgitation.   MR lumbar/thoracic spine 10/22 >> no evidence of discitis or osteomyelitis CT angio chest 10/21 >> numerous nodules with some cavitation, consistent with septic emboli CT foot left/CT right ankle >> evidence of cellulitis, no osteomyelitis  Micro Data:  Blood cultures 11/17 >>MRSA,  MRSA PCR >> postive Antimicrobials:  Zosyn 11/17 >>11/18 Vanco 11/17 >>  Interim history/subjective:  Worsening dry hacking cough this morning  Objective   Blood pressure 95/64, pulse  (!) 105, temperature 97.8 F (36.6 C), temperature source Axillary, resp. rate (!) 48, weight 53.1 kg, SpO2 92 %. 2L [place]     Intake/Output Summary (Last 24 hours) at 12/03/2019 1052 Last data filed at 12/03/2019 1000 Gross per 24 hour  Intake 1197.51 ml  Output 200 ml  Net 997.51 ml   Filed Weights   12/01/19 0900 12/02/19 0500 12/03/19 0500  Weight: 44 kg 51.1 kg 53.1 kg    Examination: General: Cachectic young woman appears much older than stated age HENT: Hair matted, colored, no icterus Lungs: Clear breath sounds bilateral, no accessory muscle use, frequent coughing Cardiovascular:  Tachycardic, regular, systolic murmur present Abdomen: Soft, diffuse tenderness, no guarding or rigidity Extremities: Track marks in both hands and arms, ecchymoses noted in LUE Neuro: Awake and oriented, moving all extermities Psych: anxious,  In pain  Resolved Hospital Problem list    Septic shock AKI  Assessment & Plan:  MRSA bacteremia and tricusid valve endocarditis Off pressors. Cultures positive for MRSA.   - Continue on vancomycin - Ct surgery consulted for debulking, plan for surgery on 11/22 - ID consulted appreciate recommendations  Anemia, Thrombocytopenia 2/2 to critical illness -Continue to monitor CBC -Transfuse if Hgb<7  Severe thrombocytopenia likely due to MRSA sepsis Plts 53 this morning, no signs of bleeding continue to monitor  Heroin addiction  - Methadone for pain control added 11/19. - Dilaudid, oxycodone PRN - Decrease ketamine drip as able. Still very high pain requirements.   Cough: Will add hycodan cough syrup today  IV infiltration LUE with infiltrated IV - warm compresses.   Best practice:  Diet: Regular  fiet Pain/Anxiety/Delirium protocol (if indicated): Ketamine, methadone VAP protocol (if indicated): N/A DVT prophylaxis: Lovenox GI prophylaxis: N/A Glucose control: CBGs Mobility: Bedrest Code Status: Full Family Communication: Mom updated  at bedside Disposition: ICU  The patient is critically ill with multiple organ systems failure and requires high complexity decision making for assessment and support, frequent evaluation and titration of therapies, application of advanced monitoring technologies and extensive interpretation of multiple databases.   Critical Care Time devoted to patient care services described in this note is 34 minutes. This time reflects time of care of this signee Charlott Holler . This critical care time does not reflect separately billable procedures or procedure time, teaching time or supervisory time of PA/NP/Med student/Med Resident etc but could involve care discussion time.  Mickel Baas [facility] 12/03/2019 10:53 AM  Pager: [phone] After hours pager: [phone]   Labs   CBC: Recent Labs  Lab 11/30/19 1226 11/30/19 1252 11/30/19 2115 12/01/19 0405 12/02/19 0129  WBC 16.0*  --   --  23.8* 27.2*  NEUTROABS 12.7*  --   --   --   --   HGB 6.4* 7.1* 10.2* 8.8* 9.4*  HCT 21.9* 21.0* 30.0* 27.8* 28.7*  MCV 83.9  --   --  82.2 81.1  PLT 47*  --   --  60* 53*    Basic Metabolic Panel: Recent Labs  Lab 11/30/19 1226 11/30/19 1226 11/30/19 1252 11/30/19 2115 12/01/19 0405 12/02/19 0129 12/03/19 0319  NA 133*   < > 132* 134* 132* 136 135  K 3.2*   < > 3.2* 3.1* 3.6 3.5 3.5  CL 93*  --   --   --  100 103 105  CO2 14*  --   --   --  20* 21* 21*  GLUCOSE 130*  --   --   --  103* 97 93  BUN 47*  --   --   --  43* 29* 18  CREATININE 1.39*  --   --   --  0.97 0.81 0.61  CALCIUM 8.2*  --   --   --  7.6* 7.6* 7.6*  MG  --   --   --   --  2.2  --   --   PHOS  --   --   --   --  4.6  --   --    < > = values in this interval not displayed.   GFR: Estimated Creatinine Clearance: 82.1 mL/min (by C-G formula based on SCr of 0.61 mg/dL). Recent Labs  Lab 11/30/19 1226 11/30/19 1228 12/01/19 0006 12/01/19 0405 12/01/19 0857 12/02/19 0129  12/02/19 2119  WBC 16.0*  --   --  23.8*  --  27.2*  --   LATICACIDVEN  --    < > 3.9* 2.7* 2.6*  --  1.4   < > = values in this interval not displayed.    Liver Function Tests: Recent Labs  Lab 11/30/19 1226 12/03/19 0319  AST 37 416*  ALT 14 99*  ALKPHOS 94 129*  BILITOT 2.0* 1.7*  PROT 5.8* 5.3*  ALBUMIN 1.6* 1.4*   No results for input(s): LIPASE, AMYLASE in the last 168 hours. No results for input(s): AMMONIA in the last 168 hours.  ABG    Component Value Date/Time   PHART 7.448 11/30/2019 2115   PCO2ART 31.1 (L) 11/30/2019 2115   PO2ART 56 (L) 11/30/2019 2115   HCO3 21.5 11/30/2019 2115  TCO2 22 11/30/2019 2115   ACIDBASEDEF 2.0 11/30/2019 2115   O2SAT 90.0 11/30/2019 2115     Coagulation Profile: Recent Labs  Lab 11/30/19 1226  INR 1.7*    Cardiac Enzymes: No results for input(s): CKTOTAL, CKMB, CKMBINDEX, TROPONINI in the last 168 hours.  HbA1C: Hgb A1c MFr Bld  Date/Time Value Ref Range Status  11/04/2019 04:34 AM 6.3 (H) 4.8 - 5.6 % Final    Comment:    (NOTE)         Prediabetes: 5.7 - 6.4         Diabetes: >6.4         Glycemic control for adults with diabetes: <7.0     CBG: Recent Labs  Lab 12/02/19 2001 12/03/19 0346  GLUCAP 100* 85

## 2019-12-03 NOTE — Progress Notes (Signed)
Pharmacy Antibiotic Note  Amber [name] is a 29 y.o. female admitted on 11/30/2019 with sepsis and endocarditis.  The patient has known PMH of IVDU and known tricuspid MRSA endocarditis who previously left AMA x2. Pharmacy has been consulted for vancomycin dosing.   Pt has previously received vancomycin therapy (in 10/2019) with Scr 0.55 and 48.7kg with resulting CrCl of 39ml/min. She was receiving vancomycin 750mg  IV q12h and had a resultant vancomycin trough of 28mcg/ml (subtherapeutic).   Patient started on vancomycin 750mg  IV q8h and vancomycin trough obtained (2 hours early) and found to be 19 which is therapeutic and likely would be lower if obtained at correct timing (goal 15-20 mcg/mL).  Scr is stable. UOP decreased and is 0.2 ml/kg/hr.   Plan: Continue vancomycin 750mg  IV q8h (goal trough 15-72mcg/ml) Monitor renal function, CBC, clinical progress, and vancomycin trough as needed  Weight: 53.1 kg (117 lb 1 oz)  Temp (24hrs), Avg:98.1 F (36.7 C), Min:97.8 F (36.6 C), Max:98.5 F (36.9 C)  Recent Labs  Lab 11/30/19 1226 11/30/19 1228 12/01/19 0006 12/01/19 0405 12/01/19 0857 12/02/19 0129 12/02/19 2119 12/03/19 0319 12/03/19 1326  WBC 16.0*  --   --  23.8*  --  27.2*  --   --   --   CREATININE 1.39*  --   --  0.97  --  0.81  --  0.61  --   LATICACIDVEN  --  >11.0* 3.9* 2.7* 2.6*  --  1.4  --   --   VANCOTROUGH  --   --   --   --   --   --   --   --  19    Estimated Creatinine Clearance: 82.1 mL/min (by C-G formula based on SCr of 0.61 mg/dL).    Allergies  Allergen Reactions  . Bee Venom Anaphylaxis    Antimicrobials this admission: 11/17 vancomycin>> 11/17 piperacillin/tazobactam>>11/18  Microbiology results: 11/17 BCx: NG 24h 11/17 Respiratory panel: negative  11/17 MRSA PCR: positive  Thank you for allowing pharmacy to be a part of this patient's care.   12/17, PharmD Clinical Pharmacist  12/03/2019 1:56 PM

## 2019-12-04 LAB — CULTURE, BLOOD (ROUTINE X 2)

## 2019-12-04 LAB — GLUCOSE, CAPILLARY
Glucose-Capillary: 114 mg/dL — ABNORMAL HIGH (ref 70–99)
Glucose-Capillary: 96 mg/dL (ref 70–99)

## 2019-12-04 MED ORDER — LORAZEPAM 2 MG/ML IJ SOLN
0.5000 mg | Freq: Three times a day (TID) | INTRAMUSCULAR | Status: DC
  Administered 2019-12-05 – 2019-12-06 (×3): 0.5 mg via INTRAVENOUS
  Filled 2019-12-04 (×3): qty 1

## 2019-12-04 NOTE — Progress Notes (Addendum)
     [address]       [place] [address]             [phone]       No major events overnight. OR tomorrow for angio VAC debridement of the tricuspid valve. Thrombocytopenia.  Discussed transfusing 1 unit prior to surgery. Please make n.p.o. at midnight.  Ardath Lepak [name]

## 2019-12-04 NOTE — Progress Notes (Signed)
NAME:  Amber [name], MRN:  [id number], DOB:  September 25, 1990, LOS: 4 ADMISSION DATE:  11/30/2019, CONSULTATION DATE:  12/04/2019  REFERRING MD:  Lysle Morales, EDP, CHIEF COMPLAINT: Generalized weakness, hypotension  Brief History   29 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  Seen in consultation by cardiology, cardiothoracic surgery and infectious disease.  Not felt to require surgery/also not a candidate given ongoing IVDU.  She signed out AMA.  Has been living with her best friend since. Mom reports ongoing IVDU.  She had to be removed from her vehicle due to lethargy, needles noted on her arm by triage nurse.  She reports progressive weakness and pain in her sides.  Hypotensive in the ED, received 2 L of fluids and empiric Zosyn/vancomycin.  Past Medical History  IVDU  Significant Hospital Events   10/23-26 hospital admit for endocarditis, signed out AMA  Consults:  CT surgery Infectious disease  Procedures:    Significant Diagnostic Tests:  CXR 11/17: Persistent bilateral nodular opacities reflecting septic emboli seen on prior chest CT. Echo 11/17 >> 2.36 cm X 1.24c vegetation on the tricuspid valve; predominantely on the septal and posterior leaflets, with severe regurgitation.   MR lumbar/thoracic spine 10/22 >> no evidence of discitis or osteomyelitis CT angio chest 10/21 >> numerous nodules with some cavitation, consistent with septic emboli CT foot left/CT right ankle >> evidence of cellulitis, no osteomyelitis  Micro Data:  Blood cultures 11/17 >>MRSA,  MRSA PCR >> postive Antimicrobials:  Zosyn 11/17 >>11/18 Vanco 11/17 >>  Interim history/subjective:  Worsening dry hacking cough this morning  Objective   Blood pressure 127/80, pulse  (!) 105, temperature 98 F (36.7 C), temperature source Oral, resp. rate (!) 35, weight 53.5 kg, SpO2 91 %. 2L [place]     Intake/Output Summary (Last 24 hours) at 12/04/2019 1121 Last data filed at 12/04/2019 1100 Gross per 24 hour  Intake 712.07 ml  Output 850 ml  Net -137.93 ml   Filed Weights   12/02/19 0500 12/03/19 0500 12/04/19 0500  Weight: 51.1 kg 53.1 kg 53.5 kg    Examination: General: chronically ill woman, resting comfortably.  HENT: Hair matted, colored, no icterus Lungs: Clear breath sounds bilateral, no accessory muscle use, frequent coughing Cardiovascular:  Tachycardic, regular, systolic murmur present Abdomen: Soft, diffuse tenderness, no guarding or rigidity Extremities: Track marks in both hands and arms, ecchymoses noted in LUE Neuro: Awake and oriented, moving all extermities Psych: anxious,  In pain  Resolved Hospital Problem list    Septic shock AKI  Assessment & Plan:  MRSA bacteremia and tricusid valve endocarditis Off pressors. Cultures positive for MRSA.   - Continue on vancomycin - Ct surgery consulted for debulking, plan for surgery on 11/22 - ID consulted appreciate recommendations  Anemia, Thrombocytopenia 2/2 to critical illness -Continue to monitor CBC -Transfuse if Hgb<7  Severe thrombocytopenia likely due to MRSA sepsis Plts 53 11/19. Repeat labs pending this morning, no signs of bleeding continue to monitor  Heroin addiction  - Methadone for pain control added 11/19. - Dilaudid, oxycodone PRN - Decrease ketamine drip as able. Still very high pain requirements.   Cough:  hycodan cough syrup  IV infiltration LUE with infiltrated IV - warm compresses.   Best practice:  Diet: Regular diet.  NPO at midnight for OR.  Pain/Anxiety/Delirium protocol (if indicated): Ketamine, methadone VAP protocol (if indicated): N/A DVT prophylaxis: Lovenox GI prophylaxis: N/A Glucose control: CBGs Mobility: Bedrest Code Status: Full Family  Communication: Aunt updated at bedside Disposition: ICU  The patient is critically ill with multiple organ systems failure and requires high complexity decision making for assessment and support, frequent evaluation and titration of therapies, application of advanced monitoring technologies and extensive interpretation of multiple databases.   Critical Care Time devoted to patient care services described in this note is 32 minutes. This time reflects time of care of this signee Charlott Holler . This critical care time does not reflect separately billable procedures or procedure time, teaching time or supervisory time of PA/NP/Med student/Med Resident etc but could involve care discussion time.  Mickel Baas Pulmonary [facility] Critical Care Medicine 12/04/2019 11:21 AM  Pager: [phone] After hours pager: [phone]   Labs   CBC: Recent Labs  Lab 11/30/19 1226 11/30/19 1252 11/30/19 2115 12/01/19 0405 12/02/19 0129  WBC 16.0*  --   --  23.8* 27.2*  NEUTROABS 12.7*  --   --   --   --   HGB 6.4* 7.1* 10.2* 8.8* 9.4*  HCT 21.9* 21.0* 30.0* 27.8* 28.7*  MCV 83.9  --   --  82.2 81.1  PLT 47*  --   --  60* 53*    Basic Metabolic Panel: Recent Labs  Lab 11/30/19 1226 11/30/19 1226 11/30/19 1252 11/30/19 2115 12/01/19 0405 12/02/19 0129 12/03/19 0319  NA 133*   < > 132* 134* 132* 136 135  K 3.2*   < > 3.2* 3.1* 3.6 3.5 3.5  CL 93*  --   --   --  100 103 105  CO2 14*  --   --   --  20* 21* 21*  GLUCOSE 130*  --   --   --  103* 97 93  BUN 47*  --   --   --  43* 29* 18  CREATININE 1.39*  --   --   --  0.97 0.81 0.61  CALCIUM 8.2*  --   --   --  7.6* 7.6* 7.6*  MG  --   --   --   --  2.2  --   --   PHOS  --   --   --   --  4.6  --   --    < > = values in this interval not displayed.   GFR: Estimated Creatinine Clearance: 82.1 mL/min (by C-G formula based on SCr of 0.61 mg/dL). Recent Labs  Lab 11/30/19 1226 11/30/19 1228 12/01/19 0006 12/01/19 0405  12/01/19 0857 12/02/19 0129 12/02/19 2119  WBC 16.0*  --   --  23.8*  --  27.2*  --   LATICACIDVEN  --    < > 3.9* 2.7* 2.6*  --  1.4   < > = values in this interval not displayed.    Liver Function Tests: Recent Labs  Lab 11/30/19 1226 12/03/19 0319  AST 37 416*  ALT 14 99*  ALKPHOS 94 129*  BILITOT 2.0* 1.7*  PROT 5.8* 5.3*  ALBUMIN 1.6* 1.4*   No results for input(s): LIPASE, AMYLASE in the last 168 hours. No results for input(s): AMMONIA in the last 168 hours.  ABG    Component Value Date/Time   PHART 7.448 11/30/2019 2115   PCO2ART 31.1 (L) 11/30/2019 2115   PO2ART 56 (L) 11/30/2019 2115  HCO3 21.5 11/30/2019 2115   TCO2 22 11/30/2019 2115   ACIDBASEDEF 2.0 11/30/2019 2115   O2SAT 90.0 11/30/2019 2115     Coagulation Profile: Recent Labs  Lab 11/30/19 1226  INR 1.7*    Cardiac Enzymes: No results for input(s): CKTOTAL, CKMB, CKMBINDEX, TROPONINI in the last 168 hours.  HbA1C: Hgb A1c MFr Bld  Date/Time Value Ref Range Status  11/04/2019 04:34 AM 6.3 (H) 4.8 - 5.6 % Final    Comment:    (NOTE)         Prediabetes: 5.7 - 6.4         Diabetes: >6.4         Glycemic control for adults with diabetes: <7.0     CBG: Recent Labs  Lab 12/03/19 0346 12/03/19 1120 12/03/19 2114 12/03/19 2329 12/04/19 0402  GLUCAP 85 109* 95 98 114*

## 2019-12-04 NOTE — Progress Notes (Signed)
Daily Progress Note   Patient Name: Amber [name]       Date: 12/04/2019 DOB: 1990/06/19  Age: 29 y.o. MRN#: [id number] Attending Physician: Charlott Holler, MD Primary Care Physician: Patient, No Pcp Per Admit Date: 11/30/2019  Reason for Consultation/Follow-up:  To discuss complex medical decision making related to patient's goals of care and symptom management   Subjective: Patient is awake and alert.  Groaning and shifting from side to side.  She tells me she understands why she is in the hospital and that the plan is for surgery tomorrow to attempt to clean up her heart valve.  She appears very uncomfortable.  She refuses to eat and tells me she will eat at dinner time. Her Aunt left just before I came into the room.   Assessment: Patient appears anxious and agitated.  I suspect some element of withdraw symptoms.   Patient Profile/HPI: 29 y.o. female  with past medical history of heroin use, MRSA bacteremia, tricuspid endocarditis with pulmonary septic emboli who left AMA twice during the week of October 25 , 2021 was admitted on 11/30/2019 with worsening weakness.  She was found to be in septic shock with acute kidney injury, and severe thrombocytopenia.    Patient's tricuspid valve vegetation has worsened since her 10/22 echo.  11/17 echo showed greater than 1 cm vegetation on the tricuspid valve, with a slightly smaller anterior leaflet vegetation. There is lack of coaptation and severe regurgitation.   Tricuspid valve regurgitation is severe.  As patient has an on-going addiction problem she is not currently a candidate for valve replacement surgery.   Length of Stay: 4   Vital Signs: BP (!) 143/62   Pulse 98   Temp 98 F (36.7 C) (Oral)   Resp (!) 34   Wt 53.5 kg   SpO2  94%   BMI 21.57 kg/m  SpO2: SpO2: 94 % O2 Device: O2 Device: Nasal Cannula O2 Flow Rate: O2 Flow Rate (L/min): 2 L/min       Palliative Assessment/Data: suspect 40%     Palliative Care Plan    Recommendations/Plan: Will schedule very low dose ativan to attempt to calm her down  PMT will follow with you.   Code Status:  Full code  Prognosis: At very high risk of acute decline, decompensation, rehospitalization given polysubstance  abuse disorder and tricuspid valve endocarditis/MRSA bacteremia.  Discharge Planning: To Be Determined    Thank you for allowing the Palliative Medicine Team to assist in the care of this patient.  Total time spent:  25 min.     Greater than 50%  of this time was spent counseling and coordinating care related to the above assessment and plan.  Norvel Richards, PA-C Palliative Medicine  Please contact Palliative MedicineTeam phone at [phone] for questions and concerns between 7 am - 7 pm.   Please see AMION for individual provider pager numbers.

## 2019-12-04 NOTE — Anesthesia Preprocedure Evaluation (Addendum)
Anesthesia Evaluation  Patient identified by MRN, date of birth, ID band Patient awake    Reviewed: Allergy & Precautions, NPO status , Patient's Chart, lab work & pertinent test results  Airway Mallampati: III  TM Distance: >3 FB Neck ROM: Full    Dental no notable dental hx.    Pulmonary former smoker,    Pulmonary exam normal        Cardiovascular + Valvular Problems/Murmurs  Rhythm:Regular Rate:Tachycardia     Neuro/Psych PSYCHIATRIC DISORDERS Depression negative neurological ROS     GI/Hepatic negative GI ROS, (+)     substance abuse  IV drug use,   Endo/Other  negative endocrine ROS  Renal/GU negative Renal ROS     Musculoskeletal negative musculoskeletal ROS (+)   Abdominal   Peds  Hematology  (+) anemia , Thrombocytopenia    Anesthesia Other Findings Tricuspid valve endocarditis  Reproductive/Obstetrics hcg negative                            Anesthesia Physical Anesthesia Plan  ASA: III  Anesthesia Plan: General   Post-op Pain Management:    Induction: Intravenous  PONV Risk Score and Plan: 3 and Ondansetron, Dexamethasone, Midazolam and Treatment may vary due to age or medical condition  Airway Management Planned: Oral ETT  Additional Equipment: Arterial line, CVP, Ultrasound Guidance Line Placement and TEE  Intra-op Plan:   Post-operative Plan: Extubation in OR  Informed Consent: I have reviewed the patients History and Physical, chart, labs and discussed the procedure including the risks, benefits and alternatives for the proposed anesthesia with the patient or authorized representative who has indicated his/her understanding and acceptance.     Dental advisory given  Plan Discussed with: CRNA  Anesthesia Plan Comments: (TEE for intraoperative monitoring only)      Anesthesia Quick Evaluation

## 2019-12-05 ENCOUNTER — Inpatient Hospital Stay (HOSPITAL_COMMUNITY): Payer: Self-pay

## 2019-12-05 ENCOUNTER — Inpatient Hospital Stay (HOSPITAL_COMMUNITY): Payer: Self-pay | Admitting: Anesthesiology

## 2019-12-05 ENCOUNTER — Encounter (HOSPITAL_COMMUNITY)
Admission: EM | Disposition: A | Discharge status: Home / Self Care | Payer: Self-pay | Source: Home / Self Care | Attending: Internal Medicine

## 2019-12-05 DIAGNOSIS — B9562 Methicillin resistant Staphylococcus aureus infection as the cause of diseases classified elsewhere: Secondary | ICD-10-CM

## 2019-12-05 DIAGNOSIS — I330 Acute and subacute infective endocarditis: Secondary | ICD-10-CM

## 2019-12-05 HISTORY — PX: TEE WITHOUT CARDIOVERSION: SHX5443

## 2019-12-05 HISTORY — PX: APPLICATION OF ANGIOVAC: SHX6777

## 2019-12-05 LAB — BASIC METABOLIC PANEL
Anion gap: 7 (ref 5–15)
Anion gap: 13 (ref 5–15)
BUN: 7 mg/dL (ref 6–20)
BUN: 12 mg/dL (ref 6–20)
CO2: 12 mmol/L — ABNORMAL LOW (ref 22–32)
CO2: 19 mmol/L — ABNORMAL LOW (ref 22–32)
Calcium: 4.9 mg/dL — CL (ref 8.9–10.3)
Calcium: 7.8 mg/dL — ABNORMAL LOW (ref 8.9–10.3)
Chloride: 124 mmol/L — ABNORMAL HIGH (ref 98–111)
Chloride: 106 mmol/L (ref 98–111)
Creatinine, Ser: 0.36 mg/dL — ABNORMAL LOW (ref 0.44–1.00)
Creatinine, Ser: 0.58 mg/dL (ref 0.44–1.00)
GFR, Estimated: >60 mL/min (ref >60)
GFR, Estimated: >60 mL/min (ref >60)
Glucose, Bld: 95 mg/dL (ref 70–99)
Glucose, Bld: 148 mg/dL — ABNORMAL HIGH (ref 70–99)
Potassium: 2.6 mmol/L — CL (ref 3.5–5.1)
Potassium: 4.4 mmol/L (ref 3.5–5.1)
Sodium: 143 mmol/L (ref 135–145)
Sodium: 138 mmol/L (ref 135–145)

## 2019-12-05 LAB — CBC
HCT: 26.3 % — ABNORMAL LOW (ref 36.0–46.0)
HCT: 21.8 % — ABNORMAL LOW (ref 36.0–46.0)
HCT: 26.3 % — ABNORMAL LOW (ref 36.0–46.0)
Hemoglobin: 7.9 g/dL — ABNORMAL LOW (ref 12.0–15.0)
Hemoglobin: 6.6 g/dL — CL (ref 12.0–15.0)
Hemoglobin: 8 g/dL — ABNORMAL LOW (ref 12.0–15.0)
MCH: 27.7 pg (ref 26.0–34.0)
MCH: 28 pg (ref 26.0–34.0)
MCH: 28 pg (ref 26.0–34.0)
MCHC: 30 g/dL (ref 30.0–36.0)
MCHC: 30.3 g/dL (ref 30.0–36.0)
MCHC: 30.4 g/dL (ref 30.0–36.0)
MCV: 92.3 fL (ref 80.0–100.0)
MCV: 92.4 fL (ref 80.0–100.0)
MCV: 92 fL (ref 80.0–100.0)
Platelets: 74 10*3/uL — ABNORMAL LOW (ref 150–400)
Platelets: 37 10*3/uL — ABNORMAL LOW (ref 150–400)
Platelets: 46 10*3/uL — ABNORMAL LOW (ref 150–400)
RBC: 2.85 MIL/uL — ABNORMAL LOW (ref 3.87–5.11)
RBC: 2.36 MIL/uL — ABNORMAL LOW (ref 3.87–5.11)
RBC: 2.86 MIL/uL — ABNORMAL LOW (ref 3.87–5.11)
RDW: 24.9 % — ABNORMAL HIGH (ref 11.5–15.5)
RDW: 22 % — ABNORMAL HIGH (ref 11.5–15.5)
RDW: 22.9 % — ABNORMAL HIGH (ref 11.5–15.5)
WBC: 24.9 10*3/uL — ABNORMAL HIGH (ref 4.0–10.5)
WBC: 20.9 10*3/uL — ABNORMAL HIGH (ref 4.0–10.5)
WBC: 16.8 10*3/uL — ABNORMAL HIGH (ref 4.0–10.5)
nRBC: 2.3 % — ABNORMAL HIGH (ref 0.0–0.2)
nRBC: 0.6 % — ABNORMAL HIGH (ref 0.0–0.2)
nRBC: 0.5 % — ABNORMAL HIGH (ref 0.0–0.2)

## 2019-12-05 LAB — POCT I-STAT 7, (LYTES, BLD GAS, ICA,H+H)
Acid-base deficit: 1 mmol/L (ref 0.0–2.0)
Acid-base deficit: 7 mmol/L — ABNORMAL HIGH (ref 0.0–2.0)
Acid-base deficit: 5 mmol/L — ABNORMAL HIGH (ref 0.0–2.0)
Bicarbonate: 25.7 mmol/L (ref 20.0–28.0)
Bicarbonate: 22.2 mmol/L (ref 20.0–28.0)
Bicarbonate: 22.9 mmol/L (ref 20.0–28.0)
Calcium, Ion: 1.18 mmol/L (ref 1.15–1.40)
Calcium, Ion: 1.03 mmol/L — ABNORMAL LOW (ref 1.15–1.40)
Calcium, Ion: 1.14 mmol/L — ABNORMAL LOW (ref 1.15–1.40)
HCT: 25 % — ABNORMAL LOW (ref 36.0–46.0)
HCT: 18 % — ABNORMAL LOW (ref 36.0–46.0)
HCT: 24 % — ABNORMAL LOW (ref 36.0–46.0)
Hemoglobin: 8.5 g/dL — ABNORMAL LOW (ref 12.0–15.0)
Hemoglobin: 6.1 g/dL — CL (ref 12.0–15.0)
Hemoglobin: 8.2 g/dL — ABNORMAL LOW (ref 12.0–15.0)
O2 Saturation: 99 %
O2 Saturation: 99 %
O2 Saturation: 100 %
Patient temperature: 35.2
Potassium: 4.1 mmol/L (ref 3.5–5.1)
Potassium: 5.9 mmol/L — ABNORMAL HIGH (ref 3.5–5.1)
Potassium: 5.7 mmol/L — ABNORMAL HIGH (ref 3.5–5.1)
Sodium: 137 mmol/L (ref 135–145)
Sodium: 135 mmol/L (ref 135–145)
Sodium: 139 mmol/L (ref 135–145)
TCO2: 27 mmol/L (ref 22–32)
TCO2: 24 mmol/L (ref 22–32)
TCO2: 25 mmol/L (ref 22–32)
pCO2 arterial: 50.7 mmHg — ABNORMAL HIGH (ref 32.0–48.0)
pCO2 arterial: 68.7 mmHg (ref 32.0–48.0)
pCO2 arterial: 51.7 mmHg — ABNORMAL HIGH (ref 32.0–48.0)
pH, Arterial: 7.313 — ABNORMAL LOW (ref 7.350–7.450)
pH, Arterial: 7.118 — CL (ref 7.350–7.450)
pH, Arterial: 7.245 — ABNORMAL LOW (ref 7.350–7.450)
pO2, Arterial: 177 mmHg — ABNORMAL HIGH (ref 83.0–108.0)
pO2, Arterial: 174 mmHg — ABNORMAL HIGH (ref 83.0–108.0)
pO2, Arterial: 298 mmHg — ABNORMAL HIGH (ref 83.0–108.0)

## 2019-12-05 LAB — COMPREHENSIVE METABOLIC PANEL
ALT: 60 U/L — ABNORMAL HIGH (ref 0–44)
AST: 126 U/L — ABNORMAL HIGH (ref 15–41)
Albumin: 1.5 g/dL — ABNORMAL LOW (ref 3.5–5.0)
Alkaline Phosphatase: 188 U/L — ABNORMAL HIGH (ref 38–126)
Anion gap: 14 (ref 5–15)
BUN: 9 mg/dL (ref 6–20)
CO2: 18 mmol/L — ABNORMAL LOW (ref 22–32)
Calcium: 7.7 mg/dL — ABNORMAL LOW (ref 8.9–10.3)
Chloride: 103 mmol/L (ref 98–111)
Creatinine, Ser: 0.56 mg/dL (ref 0.44–1.00)
GFR, Estimated: >60 mL/min (ref >60)
Glucose, Bld: 92 mg/dL (ref 70–99)
Potassium: 3.9 mmol/L (ref 3.5–5.1)
Sodium: 135 mmol/L (ref 135–145)
Total Bilirubin: 1.1 mg/dL (ref 0.3–1.2)
Total Protein: 6 g/dL — ABNORMAL LOW (ref 6.5–8.1)

## 2019-12-05 LAB — CBC WITH DIFFERENTIAL/PLATELET
Abs Immature Granulocytes: 0.5 10*3/uL — ABNORMAL HIGH (ref 0.00–0.07)
Basophils Absolute: 0 10*3/uL (ref 0.0–0.1)
Basophils Relative: 0 %
Eosinophils Absolute: 0 10*3/uL (ref 0.0–0.5)
Eosinophils Relative: 0 %
HCT: 25.3 % — ABNORMAL LOW (ref 36.0–46.0)
Hemoglobin: 7.7 g/dL — ABNORMAL LOW (ref 12.0–15.0)
Immature Granulocytes: 2 %
Lymphocytes Relative: 6 %
Lymphs Abs: 1.2 10*3/uL (ref 0.7–4.0)
MCH: 28.3 pg (ref 26.0–34.0)
MCHC: 30.4 g/dL (ref 30.0–36.0)
MCV: 93 fL (ref 80.0–100.0)
Monocytes Absolute: 0.3 10*3/uL (ref 0.1–1.0)
Monocytes Relative: 2 %
Neutro Abs: 18.5 10*3/uL — ABNORMAL HIGH (ref 1.7–7.7)
Neutrophils Relative %: 90 %
Platelets: 42 10*3/uL — ABNORMAL LOW (ref 150–400)
RBC: 2.72 MIL/uL — ABNORMAL LOW (ref 3.87–5.11)
RDW: 22.4 % — ABNORMAL HIGH (ref 11.5–15.5)
WBC: 20.6 10*3/uL — ABNORMAL HIGH (ref 4.0–10.5)
nRBC: 0.6 % — ABNORMAL HIGH (ref 0.0–0.2)

## 2019-12-05 LAB — BPAM PLATELET PHERESIS
Blood Product Expiration Date: 202111242359
ISSUE DATE / TIME: 202111211156
Unit Type and Rh: 7300

## 2019-12-05 LAB — PREPARE PLATELET PHERESIS: Unit division: 0

## 2019-12-05 LAB — POCT I-STAT, CHEM 8
BUN: 10 mg/dL (ref 6–20)
Calcium, Ion: 1.19 mmol/L (ref 1.15–1.40)
Chloride: 103 mmol/L (ref 98–111)
Creatinine, Ser: 0.3 mg/dL — ABNORMAL LOW (ref 0.44–1.00)
Glucose, Bld: 93 mg/dL (ref 70–99)
HCT: 20 % — ABNORMAL LOW (ref 36.0–46.0)
Hemoglobin: 6.8 g/dL — CL (ref 12.0–15.0)
Potassium: 4.1 mmol/L (ref 3.5–5.1)
Sodium: 138 mmol/L (ref 135–145)
TCO2: 24 mmol/L (ref 22–32)

## 2019-12-05 LAB — PREPARE RBC (CROSSMATCH)

## 2019-12-05 LAB — GLUCOSE, CAPILLARY
Glucose-Capillary: 121 mg/dL — ABNORMAL HIGH (ref 70–99)
Glucose-Capillary: 137 mg/dL — ABNORMAL HIGH (ref 70–99)
Glucose-Capillary: 148 mg/dL — ABNORMAL HIGH (ref 70–99)
Glucose-Capillary: 87 mg/dL (ref 70–99)

## 2019-12-05 LAB — POCT ACTIVATED CLOTTING TIME
Activated Clotting Time: 0 seconds
Activated Clotting Time: 384 seconds

## 2019-12-05 LAB — ECHO INTRAOPERATIVE TEE: Weight: 1908.3 oz

## 2019-12-05 IMAGING — DX DG ABDOMEN 1V
1 series · 1 of 1 positions shown · non-contrast
Comparison: None.

CLINICAL DATA: Check gastric catheter placement

EXAM:
ABDOMEN - 1 VIEW

[abdomen kub]
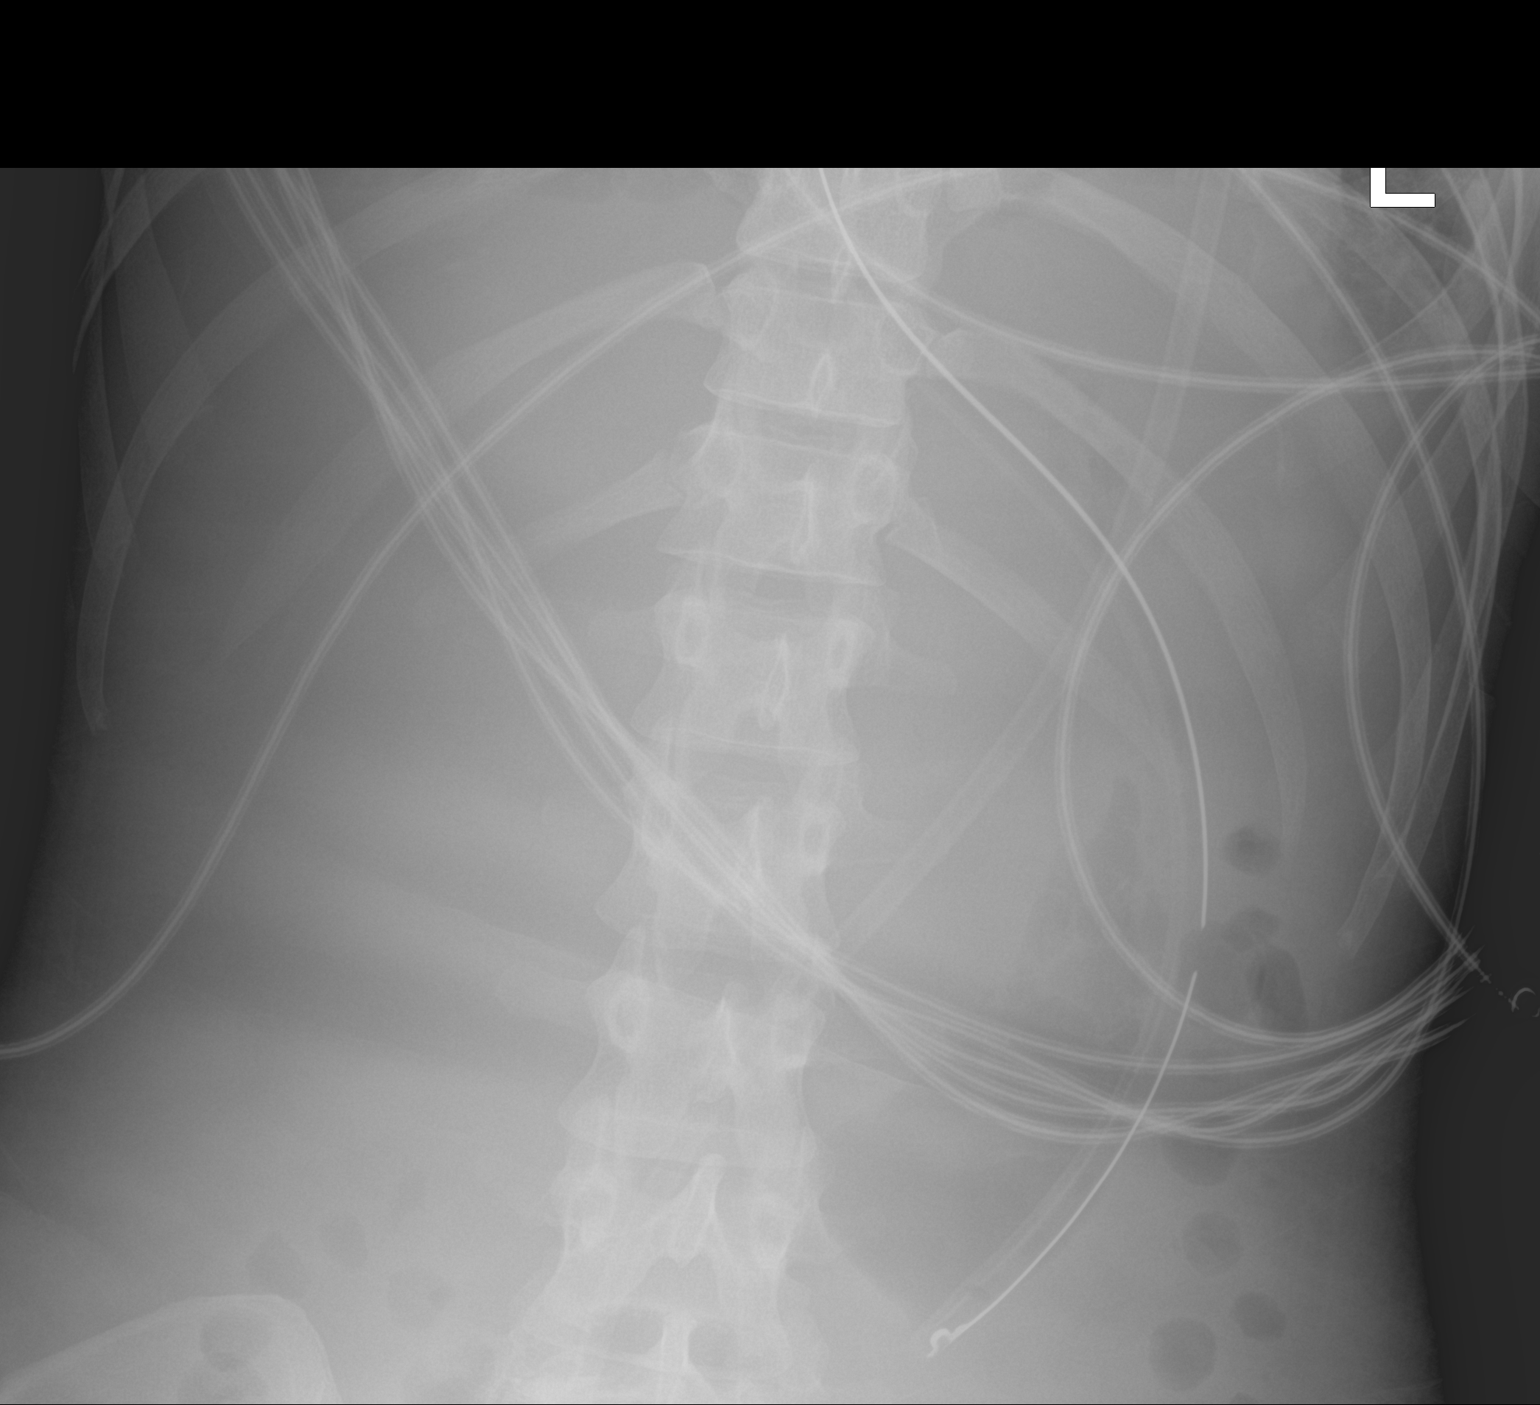

[1 of 1 positions shown; findings below may reference images not displayed]

FINDINGS: Gastric catheter is noted within the mid stomach. The previously
seen loop of catheter has been resolved in the interval.
IMPRESSION: Gastric catheter within the mid stomach.

## 2019-12-05 IMAGING — DX DG CHEST 1V PORT
1 series · 1 of 1 positions shown · non-contrast
Comparison: [DATE].

CLINICAL DATA: Sepsis.  Postoperative status.

EXAM:
PORTABLE CHEST 1 VIEW

[chest ap]
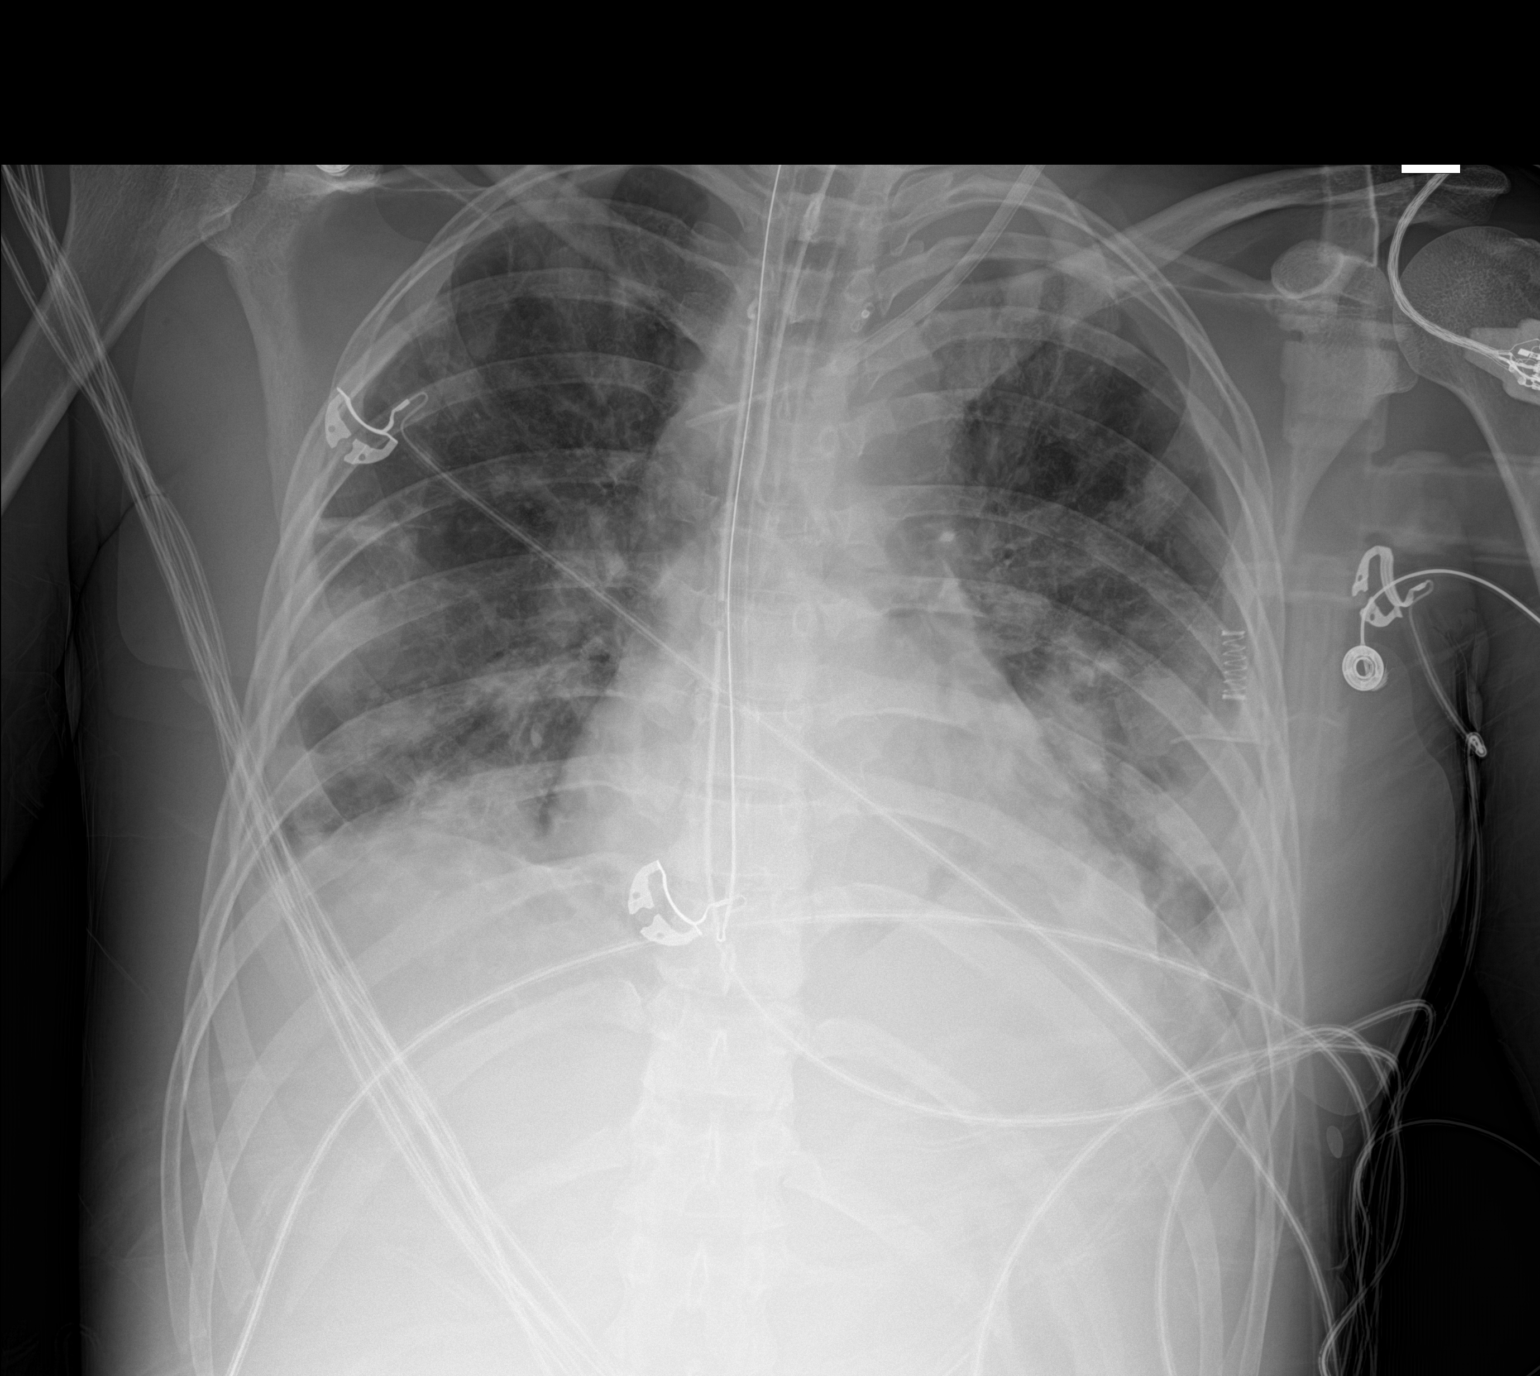

[1 of 1 positions shown; findings below may reference images not displayed]

FINDINGS: Endotracheal tube is in grossly good position. Nasogastric tube is
seen looped within the esophagus, with distal tip in the proximal
esophagus. Left internal jugular catheter is noted with tip in
expected position of the SVC. No pneumothorax is noted. Bilateral
lung opacities are noted concerning for multifocal pneumonia or
septic emboli. Bony thorax is unremarkable.
IMPRESSION: Endotracheal tube in grossly good position. Nasogastric tube is seen
looped within the esophagus, with distal tip in proximal esophagus.
Bilateral lung opacities are noted concerning for multifocal
pneumonia or septic emboli.

## 2019-12-05 IMAGING — DX DG ABDOMEN 1V
1 series · 1 of 1 positions shown · non-contrast
Comparison: Portable exam [8N] hours without priors for comparison

CLINICAL DATA: Orogastric tube placement

EXAM:
ABDOMEN - 1 VIEW

[abdomen kub]
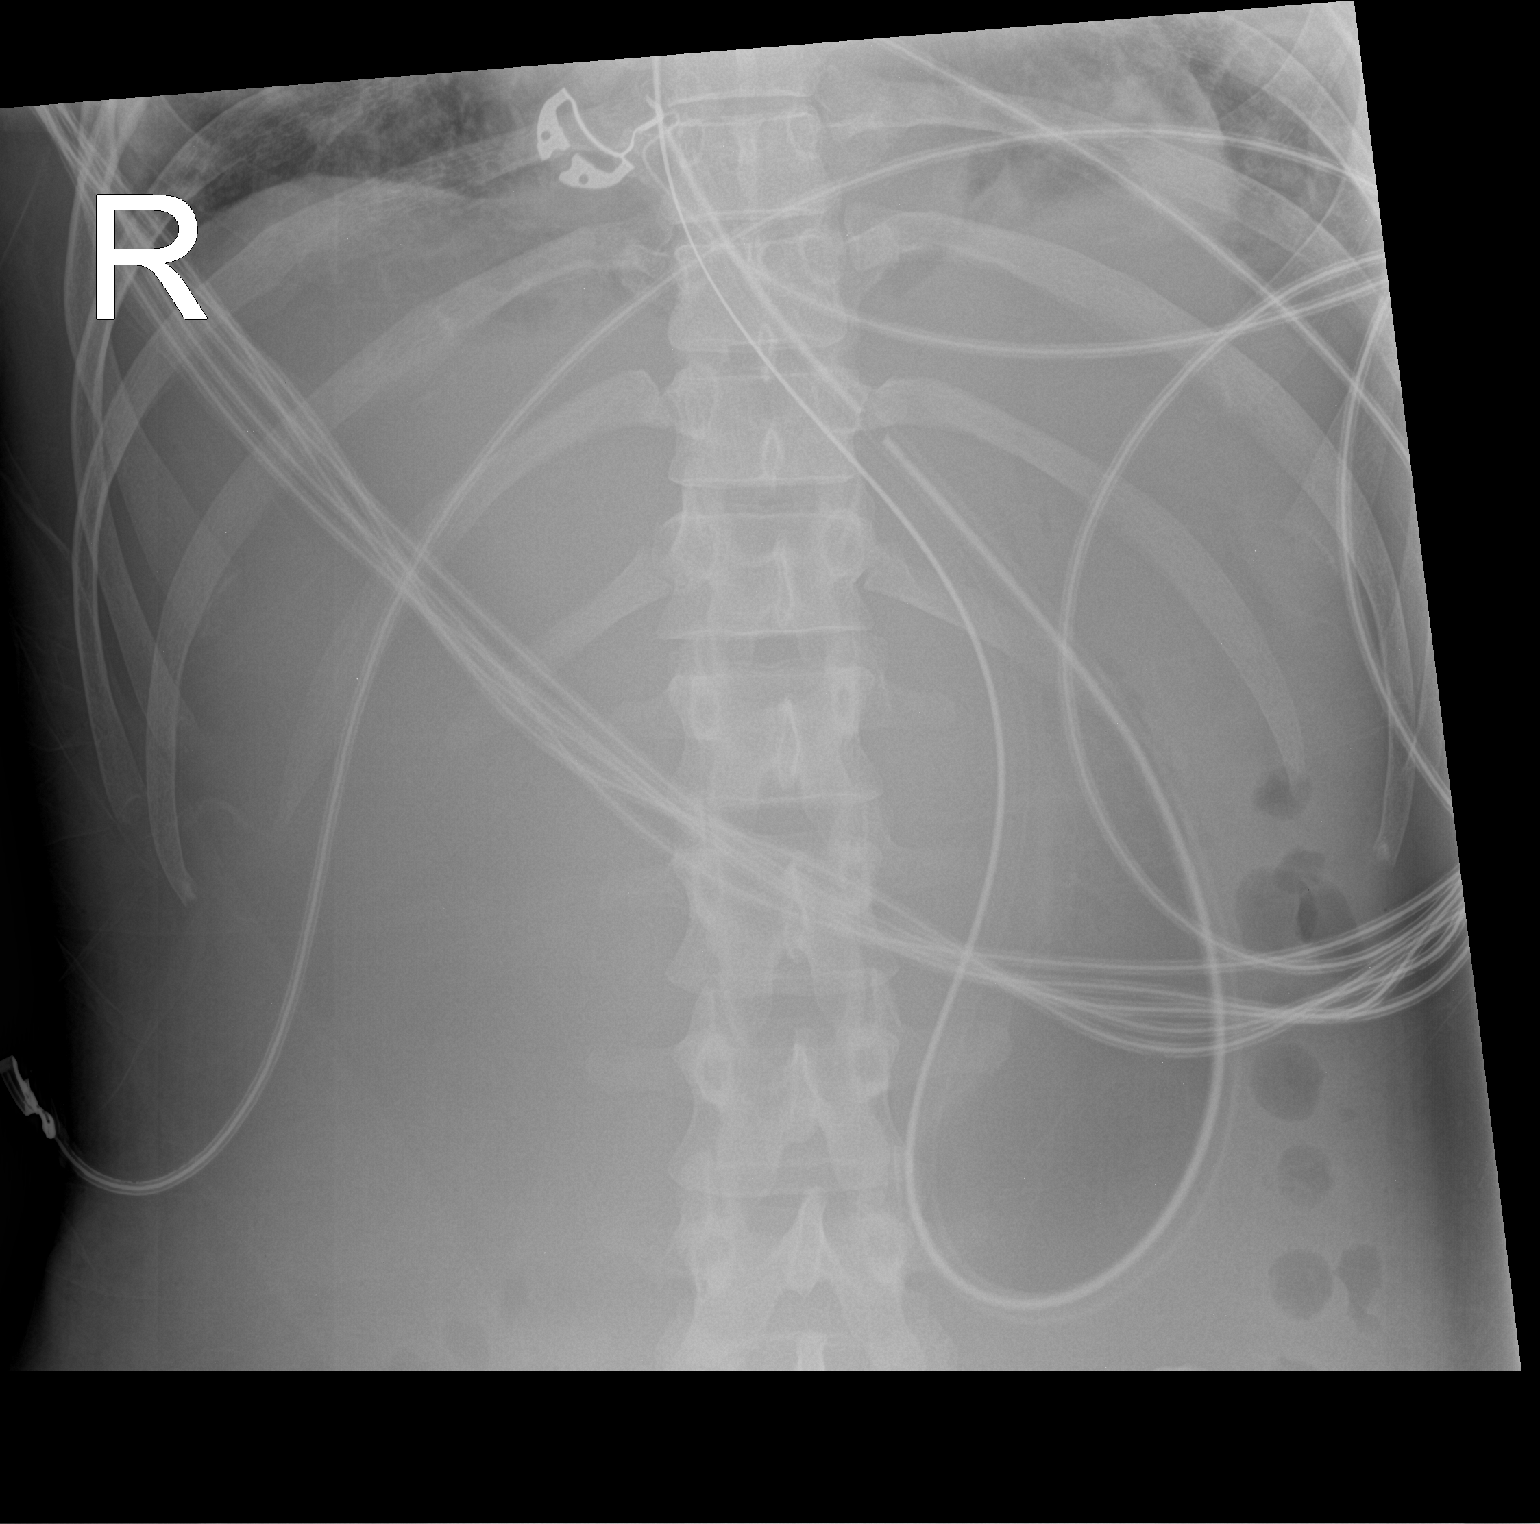

[1 of 1 positions shown; findings below may reference images not displayed]

FINDINGS: Orogastric tube is coiled at mid stomach with tip reflected back
through the gastroesophageal junction into the distal esophagus,
recommend withdrawal and replacement.

Paucity of bowel gas in upper abdomen.

Osseous structures unremarkable.
IMPRESSION: Orogastric tube coiled in mid stomach with tip reflected back
through the gastroesophageal junction into the distal esophagus;
recommend withdrawal and replacement.

Findings called to SIMI RN in MICU on [DATE] at [8N] hours.

## 2019-12-05 SURGERY — APPLICATION OF ANGIOVAC
Anesthesia: General

## 2019-12-05 MED ORDER — BUPIVACAINE HCL (PF) 0.5 % IJ SOLN
INTRAMUSCULAR | Status: DC | PRN
  Administered 2019-12-05: 30 mL

## 2019-12-05 MED ORDER — VASOPRESSIN 20 UNIT/ML IV SOLN
INTRAVENOUS | Status: DC | PRN
Start: 2019-12-05 — End: 2019-12-05
  Administered 2019-12-05 (×3): 1 [IU] via INTRAVENOUS

## 2019-12-05 MED ORDER — NOREPINEPHRINE 4 MG/250ML-% IV SOLN
INTRAVENOUS | Status: DC | PRN
  Administered 2019-12-05: 3 ug/min via INTRAVENOUS

## 2019-12-05 MED ORDER — SODIUM CHLORIDE 0.9% IV SOLUTION: Freq: Once | INTRAVENOUS | Status: AC

## 2019-12-05 MED ORDER — MINERAL OIL LIGHT 100 % EX OIL
TOPICAL_OIL | CUTANEOUS | Status: DC | PRN
  Administered 2019-12-05: 1 via TOPICAL

## 2019-12-05 MED ORDER — MIDAZOLAM HCL 2 MG/2ML IJ SOLN
INTRAMUSCULAR | Status: AC
  Filled 2019-12-05: qty 2

## 2019-12-05 MED ORDER — VASOPRESSIN 20 UNIT/ML IV SOLN
INTRAVENOUS | Status: AC
  Filled 2019-12-05: qty 1

## 2019-12-05 MED ORDER — DEXMEDETOMIDINE HCL IN NACL 400 MCG/100ML IV SOLN
0.4000 ug/kg/h | INTRAVENOUS | Status: DC
  Administered 2019-12-05 (×2): 1.2 ug/kg/h via INTRAVENOUS
  Administered 2019-12-06: 1.1 ug/kg/h via INTRAVENOUS
  Administered 2019-12-06 – 2019-12-07 (×2): 1.2 ug/kg/h via INTRAVENOUS
  Administered 2019-12-08: 0.4 ug/kg/h via INTRAVENOUS
  Filled 2019-12-05 (×7): qty 100

## 2019-12-05 MED ORDER — PROPOFOL 10 MG/ML IV BOLUS
INTRAVENOUS | Status: DC | PRN
  Administered 2019-12-05: 200 mg via INTRAVENOUS

## 2019-12-05 MED ORDER — FENTANYL CITRATE (PF) 250 MCG/5ML IJ SOLN
INTRAMUSCULAR | Status: DC | PRN
  Administered 2019-12-05: 200 ug via INTRAVENOUS
  Administered 2019-12-05: 50 ug via INTRAVENOUS

## 2019-12-05 MED ORDER — FENTANYL CITRATE (PF) 250 MCG/5ML IJ SOLN
INTRAMUSCULAR | Status: AC
  Filled 2019-12-05: qty 5

## 2019-12-05 MED ORDER — SODIUM BICARBONATE 8.4 % IV SOLN
INTRAVENOUS | Status: DC | PRN
  Administered 2019-12-05: 50 meq via INTRAVENOUS

## 2019-12-05 MED ORDER — SODIUM CHLORIDE 0.9 % IV SOLN
INTRAVENOUS | Status: AC
  Filled 2019-12-05: qty 1.2

## 2019-12-05 MED ORDER — PROPOFOL 10 MG/ML IV BOLUS
INTRAVENOUS | Status: AC
  Filled 2019-12-05: qty 20

## 2019-12-05 MED ORDER — PHENYLEPHRINE 40 MCG/ML (10ML) SYRINGE FOR IV PUSH (FOR BLOOD PRESSURE SUPPORT)
PREFILLED_SYRINGE | INTRAVENOUS | Status: AC
  Filled 2019-12-05: qty 10

## 2019-12-05 MED ORDER — HEPARIN SODIUM (PORCINE) 1000 UNIT/ML IJ SOLN
INTRAMUSCULAR | Status: DC | PRN
Start: 2019-12-05 — End: 2019-12-05
  Administered 2019-12-05: 9000 [IU] via INTRAVENOUS

## 2019-12-05 MED ORDER — DEXMEDETOMIDINE HCL IN NACL 400 MCG/100ML IV SOLN
INTRAVENOUS | Status: DC | PRN
  Administered 2019-12-05: .7 ug/kg/h via INTRAVENOUS

## 2019-12-05 MED ORDER — ARTIFICIAL TEARS OPHTHALMIC OINT
TOPICAL_OINTMENT | OPHTHALMIC | Status: DC | PRN
  Administered 2019-12-05: 1 via OPHTHALMIC

## 2019-12-05 MED ORDER — METHADONE HCL 5 MG PO TABS
10.0000 mg | ORAL_TABLET | Freq: Three times a day (TID) | ORAL | Status: DC
  Administered 2019-12-05 – 2019-12-07 (×6): 10 mg
  Filled 2019-12-05 (×5): qty 2

## 2019-12-05 MED ORDER — CALCIUM CHLORIDE 10 % IV SOLN
INTRAVENOUS | Status: DC | PRN
  Administered 2019-12-05: 1 g via INTRAVENOUS

## 2019-12-05 MED ORDER — ALBUMIN HUMAN 5 % IV SOLN: INTRAVENOUS | Status: DC | PRN

## 2019-12-05 MED ORDER — METHADONE HCL 5 MG PO TABS: 10.0000 mg | ORAL_TABLET | Freq: Three times a day (TID) | ORAL | Status: DC

## 2019-12-05 MED ORDER — LACTATED RINGERS IV SOLN: INTRAVENOUS | Status: DC | PRN

## 2019-12-05 MED ORDER — SODIUM CHLORIDE 0.9 % IV SOLN: INTRAVENOUS | Status: DC | PRN

## 2019-12-05 MED ORDER — PHENYLEPHRINE 40 MCG/ML (10ML) SYRINGE FOR IV PUSH (FOR BLOOD PRESSURE SUPPORT)
PREFILLED_SYRINGE | INTRAVENOUS | Status: DC | PRN
Start: 2019-12-05 — End: 2019-12-05
  Administered 2019-12-05: 80 ug via INTRAVENOUS
  Administered 2019-12-05: 40 ug via INTRAVENOUS
  Administered 2019-12-05: 80 ug via INTRAVENOUS
  Administered 2019-12-05: 120 ug via INTRAVENOUS
  Administered 2019-12-05: 80 ug via INTRAVENOUS

## 2019-12-05 MED ORDER — LIDOCAINE 2% (20 MG/ML) 5 ML SYRINGE
INTRAMUSCULAR | Status: DC | PRN
  Administered 2019-12-05: 60 mg via INTRAVENOUS

## 2019-12-05 MED ORDER — 0.9 % SODIUM CHLORIDE (POUR BTL) OPTIME
TOPICAL | Status: DC | PRN
  Administered 2019-12-05: 5000 mL

## 2019-12-05 MED ORDER — CALCIUM CHLORIDE 10 % IV SOLN: INTRAVENOUS | Status: DC | PRN

## 2019-12-05 MED ORDER — CALCIUM CHLORIDE 10 % IV SOLN
INTRAVENOUS | Status: AC
  Filled 2019-12-05: qty 10

## 2019-12-05 MED ORDER — EPHEDRINE SULFATE-NACL 50-0.9 MG/10ML-% IV SOSY
PREFILLED_SYRINGE | INTRAVENOUS | Status: DC | PRN
  Administered 2019-12-05: 5 mg via INTRAVENOUS
  Administered 2019-12-05: 10 mg via INTRAVENOUS

## 2019-12-05 MED ORDER — DEXAMETHASONE SODIUM PHOSPHATE 10 MG/ML IJ SOLN
INTRAMUSCULAR | Status: DC | PRN
  Administered 2019-12-05: 5 mg via INTRAVENOUS

## 2019-12-05 MED ORDER — MIDAZOLAM HCL 5 MG/5ML IJ SOLN
INTRAMUSCULAR | Status: DC | PRN
  Administered 2019-12-05 (×2): 1 mg via INTRAVENOUS
  Administered 2019-12-05: 2 mg via INTRAVENOUS

## 2019-12-05 MED ORDER — BUPIVACAINE HCL (PF) 0.5 % IJ SOLN
INTRAMUSCULAR | Status: AC
  Filled 2019-12-05: qty 30

## 2019-12-05 MED ORDER — PROTAMINE SULFATE 10 MG/ML IV SOLN: INTRAVENOUS | Status: DC | PRN

## 2019-12-05 MED ORDER — SODIUM CHLORIDE 0.9 % IV SOLN
INTRAVENOUS | Status: DC | PRN
  Administered 2019-12-05: 500 mL

## 2019-12-05 MED ORDER — ONDANSETRON HCL 4 MG/2ML IJ SOLN
INTRAMUSCULAR | Status: DC | PRN
  Administered 2019-12-05: 4 mg via INTRAVENOUS

## 2019-12-05 MED ORDER — PHENYLEPHRINE HCL (PRESSORS) 10 MG/ML IV SOLN
INTRAVENOUS | Status: AC
  Filled 2019-12-05: qty 1

## 2019-12-05 MED ORDER — SODIUM CHLORIDE 0.9% IV SOLUTION: Freq: Once | INTRAVENOUS | Status: DC

## 2019-12-05 MED ORDER — ARTIFICIAL TEARS OPHTHALMIC OINT
TOPICAL_OINTMENT | OPHTHALMIC | Status: AC
  Filled 2019-12-05: qty 3.5

## 2019-12-05 MED ORDER — POTASSIUM CHLORIDE 10 MEQ/50ML IV SOLN: 10.0000 meq | INTRAVENOUS | Status: DC

## 2019-12-05 MED ORDER — ROCURONIUM BROMIDE 100 MG/10ML IV SOLN
INTRAVENOUS | Status: DC | PRN
  Administered 2019-12-05: 60 mg via INTRAVENOUS
  Administered 2019-12-05: 10 mg via INTRAVENOUS

## 2019-12-05 MED ORDER — SODIUM BICARBONATE 8.4 % IV SOLN
INTRAVENOUS | Status: AC
  Filled 2019-12-05: qty 50

## 2019-12-05 MED ORDER — PHENYLEPHRINE HCL-NACL 20-0.9 MG/250ML-% IV SOLN
INTRAVENOUS | Status: DC | PRN
  Administered 2019-12-05: 75 ug/min via INTRAVENOUS

## 2019-12-05 MED ORDER — PROTAMINE SULFATE 10 MG/ML IV SOLN
INTRAVENOUS | Status: AC
  Filled 2019-12-05: qty 10

## 2019-12-05 MED ORDER — PROTAMINE SULFATE 10 MG/ML IV SOLN
INTRAVENOUS | Status: DC | PRN
Start: 2019-12-05 — End: 2019-12-05
  Administered 2019-12-05: 10 mg via INTRAVENOUS
  Administered 2019-12-05: 20 mg via INTRAVENOUS
  Administered 2019-12-05 (×2): 10 mg via INTRAVENOUS
  Administered 2019-12-05 (×2): 20 mg via INTRAVENOUS

## 2019-12-05 MED ORDER — LACTATED RINGERS IV SOLN
INTRAVENOUS | Status: DC | PRN
Start: 2019-12-05 — End: 2019-12-05

## 2019-12-05 MED ORDER — PHENYLEPHRINE HCL-NACL 10-0.9 MG/250ML-% IV SOLN
INTRAVENOUS | Status: DC | PRN
  Administered 2019-12-05: 60 ug/min via INTRAVENOUS

## 2019-12-05 SURGICAL SUPPLY — 64 items
ANGIOVAC CIRCUIT GEN3 (MISCELLANEOUS) ×3
BAG BANDED W/RUBBER/TAPE 36X54 (MISCELLANEOUS) ×3 IMPLANT
BLADE SURG 10 STRL SS (BLADE) ×3 IMPLANT
CANISTER SUCT 3000ML PPV (MISCELLANEOUS) ×3 IMPLANT
CANNULA C180 ANGIOVAC 180 DEG (CANNULA) ×3 IMPLANT
CANNULA C20 ANGIOVAC 20 DEG (CANNULA) IMPLANT
CANNULA OPTISITE PERFUSION 16F (CANNULA) IMPLANT
CANNULA OPTISITE PERFUSION 18F (CANNULA) ×3 IMPLANT
CANNULA OPTISITE PERFUSION 20F (CANNULA) IMPLANT
CATH ROBINSON RED A/P 18FR (CATHETERS) ×3 IMPLANT
CHLORAPREP W/TINT 26 (MISCELLANEOUS) ×3 IMPLANT
CLIP VESOCCLUDE MED 24/CT (CLIP) IMPLANT
CLIP VESOCCLUDE SM WIDE 24/CT (CLIP) IMPLANT
CNTNR URN SCR LID CUP LEK RST (MISCELLANEOUS) ×2 IMPLANT
CONT SPEC 4OZ STRL OR WHT (MISCELLANEOUS) ×1
COVER PROBE W GEL 5X96 (DRAPES) ×3 IMPLANT
COVER SURGICAL LIGHT HANDLE (MISCELLANEOUS) ×3 IMPLANT
DERMABOND ADVANCED (GAUZE/BANDAGES/DRESSINGS) ×1
DERMABOND ADVANCED .7 DNX12 (GAUZE/BANDAGES/DRESSINGS) ×2 IMPLANT
DRAPE C-ARM 42X72 X-RAY (DRAPES) ×3 IMPLANT
DRAPE HALF SHEET 40X57 (DRAPES) ×3 IMPLANT
DRAPE INCISE IOBAN 66X45 STRL (DRAPES) ×9 IMPLANT
DRYSEAL FLEXSHEATH 18FR 33CM (SHEATH)
DRYSEAL FLEXSHEATH 26FR 33CM (SHEATH) ×1
ELECT REM PT RETURN 9FT ADLT (ELECTROSURGICAL) ×3
ELECTRODE REM PT RTRN 9FT ADLT (ELECTROSURGICAL) ×2 IMPLANT
FELT TEFLON 1X6 (MISCELLANEOUS) ×3 IMPLANT
GAUZE SPONGE 4X4 16PLY XRAY LF (GAUZE/BANDAGES/DRESSINGS) ×6 IMPLANT
GLOVE BIO SURGEON STRL SZ 6.5 (GLOVE) ×3 IMPLANT
GLOVE BIO SURGEON STRL SZ7 (GLOVE) ×3 IMPLANT
GLOVE SURG SS PI 7.5 STRL IVOR (GLOVE) ×3 IMPLANT
GLOVE SURG UNDER POLY LF SZ6 (GLOVE) ×6 IMPLANT
GOWN STRL REUS W/ TWL LRG LVL3 (GOWN DISPOSABLE) ×4 IMPLANT
GOWN STRL REUS W/ TWL XL LVL3 (GOWN DISPOSABLE) ×2 IMPLANT
GOWN STRL REUS W/TWL LRG LVL3 (GOWN DISPOSABLE) ×2
GOWN STRL REUS W/TWL XL LVL3 (GOWN DISPOSABLE) ×1
KIT BASIN OR (CUSTOM PROCEDURE TRAY) ×3 IMPLANT
KIT DILATOR VASC 18G NDL (KITS) ×3 IMPLANT
KIT TURNOVER KIT B (KITS) IMPLANT
NEEDLE HYPO 25GX1X1/2 BEV (NEEDLE) IMPLANT
PACK CIRCUIT ANGIOVAC GEN3 (MISCELLANEOUS) ×2 IMPLANT
PACK ENDO MINOR (CUSTOM PROCEDURE TRAY) ×3 IMPLANT
PAD ARMBOARD 7.5X6 YLW CONV (MISCELLANEOUS) ×6 IMPLANT
POSITIONER HEAD DONUT 9IN (MISCELLANEOUS) ×3 IMPLANT
PUMP SARN DELFIN (MISCELLANEOUS) ×3 IMPLANT
SET MICROPUNCTURE 5F STIFF (MISCELLANEOUS) IMPLANT
SHEATH DRYSEAL FLEX 18FR 33CM (SHEATH) IMPLANT
SHEATH DRYSEAL FLEX 26FR 33CM (SHEATH) ×2 IMPLANT
SHEATH PINNACLE 8F 10CM (SHEATH) ×6 IMPLANT
SPONGE LAP 18X18 RF (DISPOSABLE) ×6 IMPLANT
STOPCOCK 4 WAY LG BORE MALE ST (IV SETS) IMPLANT
SUT ETHIBOND 2 0 SH (SUTURE) ×2
SUT ETHIBOND 2 0 SH 36X2 (SUTURE) ×4 IMPLANT
SUT ETHIBOND X763 2 0 SH 1 (SUTURE) ×6 IMPLANT
SUT PROLENE 6 0 BV (SUTURE) IMPLANT
SUT SILK  1 MH (SUTURE) ×2
SUT SILK 1 MH (SUTURE) ×4 IMPLANT
SYR BULB EAR ULCER 3OZ GRN STR (SYRINGE) ×3 IMPLANT
SYR CONTROL 10ML LL (SYRINGE) IMPLANT
TAPE CLOTH SURG 4X10 WHT LF (GAUZE/BANDAGES/DRESSINGS) ×6 IMPLANT
TOWEL GREEN STERILE (TOWEL DISPOSABLE) ×6 IMPLANT
TRAY FOLEY SLVR 16FR TEMP STAT (SET/KITS/TRAYS/PACK) ×3 IMPLANT
WATER STERILE IRR 1000ML POUR (IV SOLUTION) ×6 IMPLANT
WIRE AMPLATZ SS-J .035X180CM (WIRE) ×3 IMPLANT

## 2019-12-05 NOTE — Progress Notes (Signed)
Pt transported from OR to ICU via ventilator. No complications noted.

## 2019-12-05 NOTE — Plan of Care (Signed)
°  Problem: Education: °Goal: Knowledge of General Education information will improve °Description: Including pain rating scale, medication(s)/side effects and non-pharmacologic comfort measures °Outcome: Progressing °  °Problem: Health Behavior/Discharge Planning: °Goal: Ability to manage health-related needs will improve °Outcome: Progressing °  °Problem: Clinical Measurements: °Goal: Will remain free from infection °Outcome: Progressing °  °Problem: Activity: °Goal: Risk for activity intolerance will decrease °Outcome: Progressing °  °Problem: Nutrition: °Goal: Adequate nutrition will be maintained °Outcome: Progressing °  °Problem: Coping: °Goal: Level of anxiety will decrease °Outcome: Progressing °  °

## 2019-12-05 NOTE — Progress Notes (Signed)
     [address]       [place] [address]             [phone]       No events Vitals:   12/05/19 0413 12/05/19 0500  BP:  136/78  Pulse: (!) 107 (!) 106  Resp: (!) 35 (!) 37  Temp: 97.8 F (36.6 C)   SpO2: 100% 100%   Alert NAD tachypnic Tachycardic  Labs reviwed Anemic and thromboctopenic  Blood and pts ordered  OR today for angiovac debridement of tricuspid valve  [name] [name]

## 2019-12-05 NOTE — Anesthesia Procedure Notes (Signed)
Central Venous Catheter Insertion Performed by: Leonides Grills, MD, anesthesiologist Start/End11/22/2021 8:15 AM, 12/05/2019 8:30 AM Patient location: OR. Preanesthetic checklist: patient identified, IV checked, site marked, risks and benefits discussed, surgical consent, monitors and equipment checked, pre-op evaluation, timeout performed and anesthesia consent Position: Trendelenburg Patient sedated Hand hygiene performed , maximum sterile barriers used  and Seldinger technique used Catheter size: 8.5 Fr Total catheter length 10. Sheath introducer Procedure performed using ultrasound guided technique. Ultrasound Notes:anatomy identified, needle tip was noted to be adjacent to the nerve/plexus identified, no ultrasound evidence of intravascular and/or intraneural injection and image(s) printed for medical record Attempts: 2 Following insertion, line sutured, dressing applied and Biopatch. Post procedure assessment: blood return through all ports, free fluid flow and no air  Patient tolerated the procedure well with no immediate complications. Additional procedure comments: Multi access catheter placed .

## 2019-12-05 NOTE — Anesthesia Procedure Notes (Signed)
Procedure Name: Intubation Date/Time: 12/05/2019 8:04 AM Performed by: Janene Harvey, CRNA Pre-anesthesia Checklist: Patient identified, Emergency Drugs available, Suction available and Patient being monitored Patient Re-evaluated:Patient Re-evaluated prior to induction Oxygen Delivery Method: Circle system utilized Preoxygenation: Pre-oxygenation with 100% oxygen Induction Type: IV induction Ventilation: Mask ventilation without difficulty Laryngoscope Size: Mac and 4 Grade View: Grade I Tube type: Oral Tube size: 7.5 mm Number of attempts: 1 Airway Equipment and Method: Stylet and Oral airway Placement Confirmation: ETT inserted through vocal cords under direct vision,  positive ETCO2 and breath sounds checked- equal and bilateral Secured at: 22 cm Tube secured with: Tape Dental Injury: Teeth and Oropharynx as per pre-operative assessment

## 2019-12-05 NOTE — Progress Notes (Addendum)
12/05/2019 I saw and evaluated the patient. Discussed with resident and agree with resident's findings and plan as documented in the resident's note.  I have seen and evaluated the patient for septic shock secondary to MRSA endocarditis.  S:  Seen after OR.  Underwent debridement of right atrial mass/vegetation and tricuspid valve vegetation debulking.  Back on unit on pressors and ventilator.  O: Blood pressure 136/78, pulse 87, temperature (!) 95.4 F (35.2 C), resp. rate (!) 26, height 5\' 2"  (1.575 m), weight 54.1 kg, SpO2 100 %.  Sedated 29-year-old woman on vent. L arm PIV infiltration spot CDI Lungs with rhonci, passive on vent Minimal edema Still heavily sedated post op, cannot eval neuro  A:  MRSA tricuspid endocarditis s/p debulking by Dr. on 12/05/19 Postoperative respiratory failure on vent Postoperative shock question related to seeding and blood loss, improved Chronic opiate dependence, IVDA Severe protein calorie malnutrition  P:  -14:00 check CBC, BMP -Let rest on vent today if can find good place with sedation, otherwise extubate -Vanc with duration TBD -Wean pressors as able -Guarded prognosis   Patient critically ill due to respiratory failure, shock Interventions to address this today pressor titration, ventilator titration Risk of deterioration without these interventions is high  I personally spent 39 minutes providing critical care not including any separately billable procedures  12/07/19 MD [facility] 12/05/2019 12:12 PM Personal pager: 12/07/2019 If unanswered, please page CCM On-call: #[phone]      NAME:  Amber [name], MRN:  Amber [name], DOB:  Feb 13, 1990, LOS: 5 ADMISSION DATE:  11/30/2019, CONSULTATION DATE:  12/05/2019  REFERRING MD:  12/07/2019, EDP, CHIEF COMPLAINT: Generalized weakness, hypotension  Brief History   29 year 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  Seen in consultation by cardiology, cardiothoracic surgery and infectious disease.  Not felt to require surgery/also not a candidate given ongoing IVDU.  She signed out AMA.  Has been living with her best friend since. Mom reports ongoing IVDU.  She had to be removed from her vehicle due to lethargy, needles noted on her arm by triage nurse.  She reports progressive weakness and pain in her sides.  Hypotensive in the ED, received 2 L of fluids and empiric Zosyn/vancomycin.  Past Medical History  IVDU  Significant Hospital Events   10/23-26 hospital admit for endocarditis, signed out AMA  Consults:  CT surgery Infectious disease  Procedures:    Significant Diagnostic Tests:  CXR 11/17: Persistent bilateral nodular opacities reflecting septic emboli seen on prior chest CT. Echo 11/17 >> 2.36 cm X 1.24c vegetation on the tricuspid valve; predominantely on the septal and posterior leaflets, with severe regurgitation.   MR lumbar/thoracic spine 10/22 >> no evidence of discitis or osteomyelitis CT angio chest 10/21 >> numerous nodules with some cavitation, consistent with septic emboli CT foot left/CT right ankle >> evidence of cellulitis, no osteomyelitis  Micro Data:  Blood cultures 11/17 >>MRSA,  MRSA PCR >> postive Antimicrobials:  Zosyn 11/17 >>11/18 Vanco 11/17 >>  Interim history/subjective:  Patient returned from surgery this morning remains intubated on levo and neo. Patient sedated. Mother to come visit later today.  Objective   Blood pressure 136/78, pulse (!) 106, temperature 97.8 F (36.6 C), temperature source Oral, resp. rate 12/17)  37, height 5\' 2"  (1.575 m), weight 54.1 kg, SpO2 100 %. 2L [place]     Intake/Output Summary (Last 24 hours) at 12/05/2019 1135 Last data filed at  12/05/2019 1130 Gross per 24 hour  Intake 3837.21 ml  Output 675 ml  Net 3162.21 ml   Filed Weights   12/03/19 0500 12/04/19 0500 12/05/19 0432  Weight: 53.1 kg 53.5 kg 54.1 kg    Examination: General: chronically ill woman, sedated, no acute distress HENT: Hair matted, colored, no icterus, ETT in place Lungs:coarse breath sounds bialterally no accessory muscle use, Cardiovascular:  Tachycardic, regular, systolic murmur present Abdomen: Soft, diffuse tenderness, no guarding or rigidity Extremities: Track marks in both hands and arms, stable ecchymoses noted in LUE Neuro: Sedated after surgery, pupils   Resolved Hospital Problem list    AKI  Assessment & Plan:  MRSA bacteremia and tricusid valve endocarditis Septic shock Debulking today, noted to be hypotensive started on levo and neo administered 2L IVF and 1 unit pRBCs. Cultures positive for MRSA.   - Continue on vancomycin - Sp angiovac 11/22 this morning - ID consulted appreciate recommendations - Maintain MAP > 65, titrate down pressures as able  Acute hypercarbic respiratory failure  Noted with pH of 7.11 and pCO2 of 68.7 during surgery on repeat improved to pH of 7.24 and pCO2 of 51.7, did not extubate after surgery. -VAP protocol maintain O2 >90% - Wean vent if respiratory status continues to improve  Anemia, Thrombocytopenia 2/2 MRSA sepsis Low hgb of 6.1 improved to 8.2 after 1 unit pRBCs, will need repeat labs this afternoon -Transfuse if Hgb<7 -Platelets 74 this morning, continue to monitor for bleeding  Hyperkalemia Elevated potassium of 5.9>5.6 while in OR today. Will repeat this afternoon.  Heroin addiction  - Methadone for pain control added 11/19. - Dilaudid, oxycodone PRN - Decrease ketamine drip as able. Still very high pain requirements.   IV infiltration LUE with infiltrated IV - warm compresses.   Best practice:  Diet: Regular diet. NPO Pain/Anxiety/Delirium protocol (if indicated):  Ketamine, methadone VAP protocol (if indicated): Yes DVT prophylaxis: Lovenox GI prophylaxis: N/A Glucose control: CBGs Mobility: Bedrest Code Status: Full Family Communication: Will update mother at bedside once she arrives Disposition: ICU  12/19 PGY-1 Internal Medicine 12/05/19 11:45 AM  Labs   CBC: Recent Labs  Lab 11/30/19 1226 11/30/19 1252 12/01/19 0405 12/01/19 0405 12/02/19 0129 12/05/19 0120 12/05/19 0826 12/05/19 0840 12/05/19 1012  WBC 16.0*  --  23.8*  --  27.2* 24.9*  --   --   --   NEUTROABS 12.7*  --   --   --   --   --   --   --   --   HGB 6.4*   < > 8.8*   < > 9.4* 7.9* 8.5* 6.8* 6.1*  HCT 21.9*   < > 27.8*   < > 28.7* 26.3* 25.0* 20.0* 18.0*  MCV 83.9  --  82.2  --  81.1 92.3  --   --   --   PLT 47*  --  60*  --  53* 74*  --   --   --    < > = values in this interval not displayed.    Basic Metabolic Panel: Recent Labs  Lab 11/30/19 1226 11/30/19 1252 12/01/19 0405 12/01/19 0405 12/02/19 0129 12/02/19 0129 12/03/19 0319 12/05/19 0120 12/05/19 0826 12/05/19 0840 12/05/19 1012  NA 133*   < > 132*   < > 136   < >  135 135 137 138 135  K 3.2*   < > 3.6   < > 3.5   < > 3.5 3.9 4.1 4.1 5.9*  CL 93*   < > 100  --  103  --  105 103  --  103  --   CO2 14*  --  20*  --  21*  --  21* 18*  --   --   --   GLUCOSE 130*   < > 103*  --  97  --  93 92  --  93  --   BUN 47*   < > 43*  --  29*  --  18 9  --  10  --   CREATININE 1.39*   < > 0.97  --  0.81  --  0.61 0.56  --  0.30*  --   CALCIUM 8.2*  --  7.6*  --  7.6*  --  7.6* 7.7*  --   --   --   MG  --   --  2.2  --   --   --   --   --   --   --   --   PHOS  --   --  4.6  --   --   --   --   --   --   --   --    < > = values in this interval not displayed.   GFR: Estimated Creatinine Clearance: 82.1 mL/min (A) (by C-G formula based on SCr of 0.3 mg/dL (L)). Recent Labs  Lab 11/30/19 1226 11/30/19 1228 12/01/19 0006 12/01/19 0405 12/01/19 0857 12/02/19 0129 12/02/19 2119 12/05/19 0120   WBC 16.0*  --   --  23.8*  --  27.2*  --  24.9*  LATICACIDVEN  --    < > 3.9* 2.7* 2.6*  --  1.4  --    < > = values in this interval not displayed.    Liver Function Tests: Recent Labs  Lab 11/30/19 1226 12/03/19 0319 12/05/19 0120  AST 37 416* 126*  ALT 14 99* 60*  ALKPHOS 94 129* 188*  BILITOT 2.0* 1.7* 1.1  PROT 5.8* 5.3* 6.0*  ALBUMIN 1.6* 1.4* 1.5*   No results for input(s): LIPASE, AMYLASE in the last 168 hours. No results for input(s): AMMONIA in the last 168 hours.  ABG    Component Value Date/Time   PHART 7.118 (LL) 12/05/2019 1012   PCO2ART 68.7 (HH) 12/05/2019 1012   PO2ART 174 (H) 12/05/2019 1012   HCO3 22.2 12/05/2019 1012   TCO2 24 12/05/2019 1012   ACIDBASEDEF 7.0 (H) 12/05/2019 1012   O2SAT 99.0 12/05/2019 1012     Coagulation Profile: Recent Labs  Lab 11/30/19 1226  INR 1.7*    Cardiac Enzymes: No results for input(s): CKTOTAL, CKMB, CKMBINDEX, TROPONINI in the last 168 hours.  HbA1C: Hgb A1c MFr Bld  Date/Time Value Ref Range Status  11/04/2019 04:34 AM 6.3 (H) 4.8 - 5.6 % Final    Comment:    (NOTE)         Prediabetes: 5.7 - 6.4         Diabetes: >6.4         Glycemic control for adults with diabetes: <7.0     CBG: Recent Labs  Lab 12/03/19 2114 12/03/19 2329 12/04/19 0402 12/04/19 1130 12/05/19 1129  GLUCAP 95 98 114* 96 87

## 2019-12-05 NOTE — Transfer of Care (Signed)
Immediate Anesthesia Transfer of Care Note  Patient: Amber [name]  Procedure(s) Performed: APPLICATION OF ANGIOVAC (N/A ) TRANSESOPHAGEAL ECHOCARDIOGRAM (TEE) TRANSESOPHAGEAL ECHOCARDIOGRAM (TEE) (N/A )  Patient Location: ICU  Anesthesia Type:General  Level of Consciousness: Patient remains intubated per anesthesia plan  Airway & Oxygen Therapy: Patient placed on Ventilator (see vital sign flow sheet for setting)  Post-op Assessment: Report given to RN and Post -op Vital signs reviewed and stable  Post vital signs: Reviewed  Last Vitals:  Vitals Value Taken Time  BP 109/76 12/05/19 1122  Temp    Pulse 81 12/05/19 1128  Resp 24 12/05/19 1130  SpO2 100 % 12/05/19 1128  Vitals shown include unvalidated device data.  Last Pain:  Vitals:   12/05/19 0413  TempSrc: Oral  PainSc:       Patients Stated Pain Goal: 4 (12/01/19 0800)  Complications: No complications documented.

## 2019-12-05 NOTE — Op Note (Signed)
[address]       [name] [address]             [phone]          11/10/2018   Patient:  Amber [name] Pre-Op Dx:     Tricuspid valve endocarditis   MRSA bacteremia                         Sepsis                         Septic pulmonary emboli   Post-op Dx:  same Procedure: - Right femoral vein cannulation with a 82F cannula - Right internal jugular vein cannulation with a 69F Sheath - Right heart cannulation - Debridement of right atrial mass - Debridement of tricuspid valve vegetation   Surgeon and Role:      * Yovani Cogburn, Eliezer Lofts, MD - Primary    * Gaynelle Arabian, PA-C - assisting Anesthesia  general EBL:  100 ml Blood Administration: 1 unit of blood Specimen:  Tricuspid valve vegetation   Indications: The patient admitted to the hospital with tricuspid valve endocarditis and MRSA bacteremia.  Due to ongoing drug use, the patient was not a good surgical candidate for valve replacement.  The patient did however have a large tricuspid valve vegetation and evidence of multiple septic pulmonary emboli.  Catheter-based debridement of the tricuspid valve vegetation was recommended.   Findings: Pt was hypotensive at the start of the case requiring levophed and phenylephrine to keep a reasonable blood pressure.  We were able to visualize the tricuspid valve vegetation, and debrided a sizeable portion.  There were some filamentous remnants left on the valve that we attempted to debride.  We achieved good contact and engagement with catheter, without much change.  It is likely that these were ruptured components of subvalvular apparatus.   Operative Technique: After the risks, benefits and alternatives were thoroughly discussed, the patient was brought to the operative theatre.  Anesthesia was induced, and she was prepped and draped in normal sterile fashion.  An appropriate surgical pause was performed and preoperative antibiotics were dosed  accordingly.   We began with ultrasound-guided cannulation of the right femoral vein using a micropuncture set.  We confirmed that our wire was in the IVC using fluoroscopy.  After systemically heparinizing the patient, the venotomy was then sequentially dilated over wire and our 18 French catheter was in place.  This catheter was then connected to the angiovac circuit.   Next we moved to the right internal jugular vein.  Using ultrasound guidance and a micropuncture set we accessed the vein.  Wire was then threaded down the SVC into the heart and down into the abdominal IVC.  The tract was then dilated sequentially using fluoroscopy.  In the 26 [place] dry seal sheath was then inserted.  After we confirmed therapeutic ACT, the ECMO circuit was initiated and we used the Angiovac to debride the tricuspid valve with TEE guidance.   After achieving an optimal result we discontinued our procedure, and returned the remaining blood from the Angiovac circuit to the patient.  The catheters were removed and the sites were closed with a pledgeted mattress suture.  Pressure was held while heparinization was reversed with protamine.   The patient tolerated the procedure without any immediate complications, and was transferred to the PACU in stable condition.   Amber [name]

## 2019-12-05 NOTE — Brief Op Note (Signed)
11/30/2019 - 12/05/2019  10:19 AM  PATIENT:  Amber [name]  29 y.o. female  PRE-OPERATIVE DIAGNOSIS:  Tricuspid valve endocarditis  POST-OPERATIVE DIAGNOSIS:  Tricuspid valve endocarditis  PROCEDURE:    APPLICATION OF ANGIOVAC WITH EVACUATION OF MULTIPLE VEGETATIONS  TRANSESOPHAGEAL ECHOCARDIOGRAM    SURGEON:   Lightfoot, Eliezer Lofts, MD - Primary  PHYSICIAN ASSISTANT:  Tayvia Faughnan  ANESTHESIA:   general  EBL:  75ml   BLOOD ADMINISTERED:none  DRAINS: none   LOCAL MEDICATIONS USED:  NONE  SPECIMEN:  Source of Specimen:  Multiple tricuspid valve vegetations  DISPOSITION OF SPECIMEN:  Microbiology  COUNTS:  YES  DICTATION: .Dragon Dictation  PLAN OF CARE: Admit to inpatient   PATIENT DISPOSITION:  ICU - intubated and hemodynamically stable.   Delay start of Pharmacological VTE agent (>24hrs) due to surgical blood loss or risk of bleeding: yes

## 2019-12-05 NOTE — Anesthesia Postprocedure Evaluation (Signed)
Anesthesia Post Note  Patient: Amber [name]  Procedure(s) Performed: APPLICATION OF ANGIOVAC (N/A ) TRANSESOPHAGEAL ECHOCARDIOGRAM (TEE) TRANSESOPHAGEAL ECHOCARDIOGRAM (TEE) (N/A )     Patient location during evaluation: ICU Anesthesia Type: General Level of consciousness: sedated Pain management: pain level controlled Vital Signs Assessment: post-procedure vital signs reviewed and stable Respiratory status: patient remains intubated per anesthesia plan Cardiovascular status: stable Postop Assessment: no apparent nausea or vomiting Anesthetic complications: no   No complications documented.  Last Vitals:  Vitals:   12/05/19 1300 12/05/19 1400  BP: (!) 113/59 109/64  Pulse: 91 85  Resp: (!) 28 (!) 27  Temp: (!) 35.8 C (!) 36.2 C  SpO2: 100% 100%    Last Pain:  Vitals:   12/05/19 1200  TempSrc: Bladder  PainSc:                  Catheryn Bacon Aser Nylund

## 2019-12-05 NOTE — Anesthesia Procedure Notes (Signed)
Arterial Line Insertion Start/End11/22/2021 8:05 AM, 12/05/2019 8:10 AM Performed by: Leonides Grills, MD, anesthesiologist  Patient location: OR. Preanesthetic checklist: patient identified, IV checked, site marked, risks and benefits discussed, surgical consent, monitors and equipment checked, pre-op evaluation, timeout performed and anesthesia consent Right, radial was placed Catheter size: 20 Fr Hand hygiene performed , maximum sterile barriers used  and Seldinger technique used  Attempts: 1 Procedure performed using ultrasound guided technique. Ultrasound Notes:anatomy identified, needle tip was noted to be adjacent to the nerve/plexus identified and no ultrasound evidence of intravascular and/or intraneural injection Following insertion, dressing applied and Biopatch. Post procedure assessment: normal and unchanged  Patient tolerated the procedure well with no immediate complications.

## 2019-12-05 NOTE — Progress Notes (Signed)
Pts ETT retracted 2cm per MD order. ETT is now placed 21@ lip.

## 2019-12-06 ENCOUNTER — Encounter (HOSPITAL_COMMUNITY): Payer: Self-pay | Admitting: Thoracic Surgery (Cardiothoracic Vascular Surgery)

## 2019-12-06 DIAGNOSIS — I078 Other rheumatic tricuspid valve diseases: Secondary | ICD-10-CM

## 2019-12-06 DIAGNOSIS — A4902 Methicillin resistant Staphylococcus aureus infection, unspecified site: Secondary | ICD-10-CM | POA: Diagnosis present

## 2019-12-06 DIAGNOSIS — A4102 Sepsis due to Methicillin resistant Staphylococcus aureus: Secondary | ICD-10-CM

## 2019-12-06 LAB — PREPARE PLATELET PHERESIS: Unit division: 0

## 2019-12-06 LAB — BASIC METABOLIC PANEL
Anion gap: 13 (ref 5–15)
BUN: 29 mg/dL — ABNORMAL HIGH (ref 6–20)
CO2: 18 mmol/L — ABNORMAL LOW (ref 22–32)
Calcium: 7.8 mg/dL — ABNORMAL LOW (ref 8.9–10.3)
Chloride: 107 mmol/L (ref 98–111)
Creatinine, Ser: 0.79 mg/dL (ref 0.44–1.00)
GFR, Estimated: >60 mL/min (ref >60)
Glucose, Bld: 126 mg/dL — ABNORMAL HIGH (ref 70–99)
Potassium: 4.5 mmol/L (ref 3.5–5.1)
Sodium: 138 mmol/L (ref 135–145)

## 2019-12-06 LAB — ACID FAST SMEAR (AFB, MYCOBACTERIA): Acid Fast Smear: NEGATIVE

## 2019-12-06 LAB — TRIGLYCERIDES: Triglycerides: 114 mg/dL (ref <150)

## 2019-12-06 LAB — MAGNESIUM
Magnesium: 2.1 mg/dL (ref 1.7–2.4)
Magnesium: 1.8 mg/dL (ref 1.7–2.4)

## 2019-12-06 LAB — CBC
HCT: 28.3 % — ABNORMAL LOW (ref 36.0–46.0)
Hemoglobin: 8.8 g/dL — ABNORMAL LOW (ref 12.0–15.0)
MCH: 28.3 pg (ref 26.0–34.0)
MCHC: 31.1 g/dL (ref 30.0–36.0)
MCV: 91 fL (ref 80.0–100.0)
Platelets: 88 10*3/uL — ABNORMAL LOW (ref 150–400)
RBC: 3.11 MIL/uL — ABNORMAL LOW (ref 3.87–5.11)
RDW: 24.5 % — ABNORMAL HIGH (ref 11.5–15.5)
WBC: 21.7 10*3/uL — ABNORMAL HIGH (ref 4.0–10.5)
nRBC: 0.2 % (ref 0.0–0.2)

## 2019-12-06 LAB — BPAM PLATELET PHERESIS
Blood Product Expiration Date: 202111232359
Unit Type and Rh: 6200

## 2019-12-06 LAB — GLUCOSE, CAPILLARY
Glucose-Capillary: 104 mg/dL — ABNORMAL HIGH (ref 70–99)
Glucose-Capillary: 109 mg/dL — ABNORMAL HIGH (ref 70–99)
Glucose-Capillary: 117 mg/dL — ABNORMAL HIGH (ref 70–99)
Glucose-Capillary: 127 mg/dL — ABNORMAL HIGH (ref 70–99)
Glucose-Capillary: 262 mg/dL — ABNORMAL HIGH (ref 70–99)
Glucose-Capillary: 60 mg/dL — ABNORMAL LOW (ref 70–99)
Glucose-Capillary: 61 mg/dL — ABNORMAL LOW (ref 70–99)
Glucose-Capillary: 98 mg/dL (ref 70–99)

## 2019-12-06 LAB — PHOSPHORUS
Phosphorus: 5.5 mg/dL — ABNORMAL HIGH (ref 2.5–4.6)
Phosphorus: 4.7 mg/dL — ABNORMAL HIGH (ref 2.5–4.6)

## 2019-12-06 LAB — VANCOMYCIN, TROUGH: Vancomycin Tr: 40 ug/mL (ref 15–20)

## 2019-12-06 MED ORDER — FUROSEMIDE 10 MG/ML IJ SOLN: 40.0000 mg | Freq: Once | INTRAMUSCULAR | Status: AC

## 2019-12-06 MED ORDER — VITAL HIGH PROTEIN PO LIQD: 1000.0000 mL | ORAL | Status: DC

## 2019-12-06 MED ORDER — SODIUM CHLORIDE 0.9 % IV SOLN
250.0000 mL | INTRAVENOUS | Status: DC
  Administered 2019-12-06 – 2019-12-14 (×4): 250 mL via INTRAVENOUS

## 2019-12-06 MED ORDER — PROPOFOL 1000 MG/100ML IV EMUL: 5.0000 ug/kg/min | INTRAVENOUS | Status: DC

## 2019-12-06 MED ORDER — FUROSEMIDE 10 MG/ML IJ SOLN
INTRAMUSCULAR | Status: AC
  Administered 2019-12-06: 40 mg via INTRAVENOUS
  Filled 2019-12-06: qty 4

## 2019-12-06 MED ORDER — NOREPINEPHRINE 4 MG/250ML-% IV SOLN
2.0000 ug/min | INTRAVENOUS | Status: DC
  Administered 2019-12-06: 1 ug/min via INTRAVENOUS
  Administered 2019-12-06: 2 ug/min via INTRAVENOUS
  Filled 2019-12-06: qty 250

## 2019-12-06 MED ORDER — VITAL AF 1.2 CAL PO LIQD: 1000.0000 mL | ORAL | Status: DC

## 2019-12-06 MED ORDER — OXYCODONE HCL 5 MG PO TABS
5.0000 mg | ORAL_TABLET | Freq: Three times a day (TID) | ORAL | Status: DC | PRN
  Administered 2019-12-06: 5 mg via ORAL
  Filled 2019-12-06: qty 1

## 2019-12-06 MED ORDER — PROSOURCE TF PO LIQD: 45.0000 mL | Freq: Two times a day (BID) | ORAL | Status: DC

## 2019-12-06 MED ORDER — SODIUM CHLORIDE 0.9 % IV SOLN
500.0000 mg | Freq: Every day | INTRAVENOUS | Status: DC
  Administered 2019-12-07 – 2020-01-15 (×40): 500 mg via INTRAVENOUS
  Filled 2019-12-06 (×45): qty 10

## 2019-12-06 MED ORDER — DEXTROSE 50 % IV SOLN
INTRAVENOUS | Status: AC
  Administered 2019-12-06: 50 mL
  Filled 2019-12-06: qty 50

## 2019-12-06 NOTE — Progress Notes (Signed)
Pharmacy Antibiotic Note  Amber [name] is a 29 y.o. female admitted on 11/30/2019 with sepsis and endocarditis.  Pharmacy has been consulted for antimicrobial dosing. Pt has known history of IVDU with tricuspid MRSA endocarditis s/p angiovac procedure on 11/22. Pt previously on vancomycin with supratherapeutic trough (43mcg/ml). Pt now transitioning to daptomycin therapy for MRSA coverage.  Plan: D/c vancomycin Start daptomycin 500mg  IV QD on 11/24 at 2000 Obtain baseline CK and monitor weekly thereafter Follow culture data, CBC, and clinical progress.   Height: 5\' 2"  (157.5 cm) Weight: 54.1 kg (119 lb 4.3 oz) IBW/kg (Calculated) : 50.1  Temp (24hrs), Avg:98.4 F (36.9 C), Min:97.2 F (36.2 C), Max:99.1 F (37.3 C)  Recent Labs  Lab 11/30/19 1226 11/30/19 1228 12/01/19 0006 12/01/19 0405 12/01/19 0857 12/02/19 0129 12/02/19 2119 12/03/19 0319 12/03/19 1326 12/05/19 0120 12/05/19 0840 12/05/19 1400 12/05/19 1600 12/05/19 2020 12/06/19 0929 12/06/19 1253  WBC   < >  --   --  23.8*  --    < >  --   --   --  24.9*  --  20.9* 20.6* 16.8*  --  21.7*  CREATININE   < >  --   --  0.97  --    < >  --    < >  --  0.56 0.30* 0.36* 0.58  --  0.79  --   LATICACIDVEN  --  >11.0* 3.9* 2.7* 2.6*  --  1.4  --   --   --   --   --   --   --   --   --   VANCOTROUGH  --   --   --   --   --   --   --   --  19  --   --   --   --   --   --  40*   < > = values in this interval not displayed.    Estimated Creatinine Clearance: 82.1 mL/min (by C-G formula based on SCr of 0.79 mg/dL).    Allergies  Allergen Reactions  . Bee Venom Anaphylaxis    Antimicrobials this admission: 11/23 daptomycin >>  11/17 vancomycin>>11/23 11/17 piperacillin/tazobactam>>11/18   Microbiology results: 11/22 fungus culture: pending 11/22 vegetation culture: GPCs 11/17BCx: staph aureus 11/17 Respiratory panel: negative  11/17MRSA PCR: positive  Thank you for allowing pharmacy to be a part of this  patient's care.    12/22  PGY1 pharmacy resident 12/06/2019 1:46 PM

## 2019-12-06 NOTE — Progress Notes (Addendum)
12/06/2019 I saw and evaluated the patient. Discussed with resident and agree with resident's findings and plan as documented in the resident's note.  I have seen and evaluated the patient for MRSA endocarditis with septic shock, respiratory failure.  S:  No events overnight, on minimal vent support.  O: Blood pressure 109/71, pulse 74, temperature 98.2 F (36.8 C), resp. rate (!) 24, height 5\' 2"  (1.575 m), weight 54.1 kg, SpO2 100 %.  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 -Postoperative respiratory failure on mechanical ventilation -Acute lung injury related to multiple septic emboli -Postoperative shock septic and blood loss, improved -IVDA, opiate dependence -Severe protein calorie malnutrition  P:  - Wean off ketamine, continue methadone - SAT/SBT - Probably needs cortrak, not eating enough - Wean pressors as able - Guarded prognosis   Patient critically ill due to respiratory failure, shock Interventions to address this today vasopressor titration, ventilator weaning Risk of deterioration without these interventions is high  I personally spent 35 minutes providing critical care not including any separately billable procedures  12/07/19 MD [facility] 12/06/2019 9:24 AM Personal pager: [phone] If unanswered, please page CCM On-call: #[phone]     NAME:  Amber [name], MRN:  Amber [name], DOB:  21-Jul-1990, LOS: 6 ADMISSION DATE:  11/30/2019, CONSULTATION DATE:  12/06/2019  REFERRING MD:  12/08/2019, EDP, CHIEF COMPLAINT: Generalized weakness, hypotension  Brief History   29 year old IVDU with known tricuspid MRSA endocarditis who signed out AMA x2 , on 10/26, returns with septic shock, AKI  History of present illness   She was  again admitted 10/23-10/26 and found to have tricuspid endocarditis, TEE clarified 1 cm vegetation on the tricuspid valve.  Seen in consultation by cardiology, cardiothoracic surgery and infectious disease.  Not felt to require surgery/also not a candidate given ongoing IVDU.  She signed out AMA.  Has been living with her best friend since. Mom reports ongoing IVDU.  She had to be removed from her vehicle due to lethargy, needles noted on her arm by triage nurse.  She reports progressive weakness and pain in her sides.  Hypotensive in the ED, received 2 L of fluids and empiric Zosyn/vancomycin.  Past Medical History  IVDU  Significant Hospital Events   10/23-26 hospital admit for endocarditis, signed out AMA  Consults:  CT surgery Infectious disease  Procedures:    Significant Diagnostic Tests:  CXR 11/17: Persistent bilateral nodular opacities reflecting septic emboli seen on prior chest CT. Echo 11/17 >> 2.36 cm X 1.24c vegetation on the tricuspid valve; predominantely on the septal and posterior leaflets, with severe regurgitation.   MR lumbar/thoracic spine 10/22 >> no evidence of discitis or osteomyelitis CT angio chest 10/21 >> numerous nodules with some cavitation, consistent with septic emboli CT foot left/CT right ankle >> evidence of cellulitis, no osteomyelitis  Micro Data:  Blood cultures 11/17 >>MRSA,  MRSA PCR >> postive Antimicrobials:  Zosyn 11/17 >>11/18 Vanco 11/17 >>  Interim history/subjective:  No acute overnight events, afebrile,  remains intubated on levo.   Objective   Blood pressure 101/76, pulse 84, temperature 98.8 F (37.1 C), resp. rate (!) 29, height 5\' 2"  (1.575 m), weight 54.1 kg, SpO2 100 %. 2L Amber [name]     Intake/Output Summary (Last 24 hours) at 12/06/2019 0824 Last data filed at 12/06/2019 0600 Gross  per 24 hour  Intake 3708.8 ml  Output 1370 ml  Net 2338.8 ml   Filed Weights   12/04/19 0500 12/05/19 0432 12/06/19 0500  Weight: 53.5 kg 54.1  kg 54.1 kg    Examination: General: chronically ill woman, sedated, no acute distress HENT: Hair matted, colored, no icterus, ETT in place Lungs:coarse breath sounds bialterally no accessory muscle use, Cardiovascular: regular, systolic murmur present Abdomen: Soft, +BS, mild distention Extremities: Track marks in both hands and arms, stable ecchymoses noted in LUE Neuro: Sedated on precedex, not following commands  Resolved Hospital Problem list   AKI  Assessment & Plan:  MRSA bacteremia and tricusid valve endocarditis Septic shock Postoperative shock Maps between 71-90 overnight. Cultures positive for MRSA. Sp angiovac 11/22 this morning  - Continue on vancomycin - ID consulted appreciate recommendations - Maintain MAP > 65, plan to d/c pressors today  Postoperative hypercarbic respiratory failure  -VAP protocol - maintain O2 >90% - Attempt to extubate today  Anemia, Thrombocytopenia 2/2 MRSA sepsis Hgb of 8.0  -Transfuse if Hgb<7 -Platelets 74 this morning, continue to monitor for bleeding  Hyperkalemia Elevated potassium during surgery yesterday. On repeat potassium of 4.4. Continue to monitor  Heroin addiction  - Methadone for pain control added 11/19 in creased to 10 mg TID, Qtc 450 today - Dilaudid, oxycodone PRN - Decrease ketamine drip as able.  IV infiltration LUE with infiltrated IV - warm compresses.   Best practice:  Diet: Regular diet. NPO Pain/Anxiety/Delirium protocol (if indicated): Ketamine, methadone VAP protocol (if indicated): Yes, attempt to extubate today DVT prophylaxis: Lovenox GI prophylaxis: N/A Glucose control: CBGs Mobility: Bedrest Code Status: Full Family Communication: Will update mother Disposition: ICU  Amber [name] PGY-1 Internal Medicine 12/06/19 8:24 AM  Labs   CBC: Recent Labs  Lab 11/30/19 1226 11/30/19 1252 12/02/19 0129 12/02/19 0129 12/05/19 0120 12/05/19 0826 12/05/19 1012 12/05/19 1049 12/05/19 1400  12/05/19 1600 12/05/19 2020  WBC 16.0*   < > 27.2*  --  24.9*  --   --   --  20.9* 20.6* 16.8*  NEUTROABS 12.7*  --   --   --   --   --   --   --   --  18.5*  --   HGB 6.4*   < > 9.4*   < > 7.9*   < > 6.1* 8.2* 6.6* 7.7* 8.0*  HCT 21.9*   < > 28.7*   < > 26.3*   < > 18.0* 24.0* 21.8* 25.3* 26.3*  MCV 83.9   < > 81.1  --  92.3  --   --   --  92.4 93.0 92.0  PLT 47*   < > 53*  --  74*  --   --   --  37* 42* 46*   < > = values in this interval not displayed.    Basic Metabolic Panel: Recent Labs  Lab 12/01/19 0405 12/01/19 0405 12/02/19 0129 12/02/19 0129 12/03/19 0319 12/03/19 0319 12/05/19 0120 12/05/19 0826 12/05/19 0840 12/05/19 1012 12/05/19 1049 12/05/19 1400 12/05/19 1600  NA 132*   < > 136   < > 135   < > 135   < > 138 135 139 143 138  K 3.6   < > 3.5   < > 3.5   < > 3.9   < > 4.1 5.9* 5.7* 2.6* 4.4  CL 100   < > 103   < > 105  --  103  --  103  --   --  124* 106  CO2 20*   < > 21*  --  21*  --  18*  --   --   --   --  12* 19*  GLUCOSE 103*   < > 97   < > 93  --  92  --  93  --   --  95 148*  BUN 43*   < > 29*   < > 18  --  9  --  10  --   --  7 12  CREATININE 0.97   < > 0.81   < > 0.61  --  0.56  --  0.30*  --   --  0.36* 0.58  CALCIUM 7.6*   < > 7.6*  --  7.6*  --  7.7*  --   --   --   --  4.9* 7.8*  MG 2.2  --   --   --   --   --   --   --   --   --   --   --   --   PHOS 4.6  --   --   --   --   --   --   --   --   --   --   --   --    < > = values in this interval not displayed.   GFR: Estimated Creatinine Clearance: 82.1 mL/min (by C-G formula based on SCr of 0.58 mg/dL). Recent Labs  Lab 11/30/19 1226 12/01/19 0006 12/01/19 0405 12/01/19 0857 12/02/19 0129 12/02/19 2119 12/05/19 0120 12/05/19 1400 12/05/19 1600 12/05/19 2020  WBC   < >  --  23.8*  --    < >  --  24.9* 20.9* 20.6* 16.8*  LATICACIDVEN  --  3.9* 2.7* 2.6*  --  1.4  --   --   --   --    < > = values in this interval not displayed.    Liver Function Tests: Recent Labs  Lab  11/30/19 1226 12/03/19 0319 12/05/19 0120  AST 37 416* 126*  ALT 14 99* 60*  ALKPHOS 94 129* 188*  BILITOT 2.0* 1.7* 1.1  PROT 5.8* 5.3* 6.0*  ALBUMIN 1.6* 1.4* 1.5*   No results for input(s): LIPASE, AMYLASE in the last 168 hours. No results for input(s): AMMONIA in the last 168 hours.  ABG    Component Value Date/Time   PHART 7.245 (L) 12/05/2019 1049   PCO2ART 51.7 (H) 12/05/2019 1049   PO2ART 298 (H) 12/05/2019 1049   HCO3 22.9 12/05/2019 1049   TCO2 25 12/05/2019 1049   ACIDBASEDEF 5.0 (H) 12/05/2019 1049   O2SAT 100.0 12/05/2019 1049     Coagulation Profile: Recent Labs  Lab 11/30/19 1226  INR 1.7*    Cardiac Enzymes: No results for input(s): CKTOTAL, CKMB, CKMBINDEX, TROPONINI in the last 168 hours.  HbA1C: Hgb A1c MFr Bld  Date/Time Value Ref Range Status  11/04/2019 04:34 AM 6.3 (H) 4.8 - 5.6 % Final    Comment:    (NOTE)         Prediabetes: 5.7 - 6.4         Diabetes: >6.4         Glycemic control for adults with diabetes: <7.0     CBG: Recent Labs  Lab 12/05/19 1943 12/05/19 2341 12/06/19 0349 12/06/19 0351 12/06/19 0744  GLUCAP 148* 121* 61* 127* 104*

## 2019-12-06 NOTE — Progress Notes (Addendum)
      [address]       [name] [address]             [phone]        1 Day Post-Op Procedure(s) (LRB): APPLICATION OF ANGIOVAC (N/A) TRANSESOPHAGEAL ECHOCARDIOGRAM (TEE) (N/A) TRANSESOPHAGEAL ECHOCARDIOGRAM (TEE)  Subjective: Patient sedated, on vent  Objective: Vital signs in last 24 hours: Temp:  [95.4 F (35.2 C)-98.8 F (37.1 C)] 98.2 F (36.8 C) (11/23 0738) Pulse Rate:  [74-101] 74 (11/23 0738) Cardiac Rhythm: Normal sinus rhythm (11/23 0400) Resp:  [13-30] 24 (11/23 0738) BP: (92-136)/(52-86) 109/71 (11/23 0738) SpO2:  [98 %-100 %] 100 % (11/23 0738) Arterial Line BP: (88-144)/(54-100) 116/67 (11/23 0400) FiO2 (%):  [50 %-60 %] 50 % (11/23 0738) Weight:  [54.1 kg] 54.1 kg (11/23 0500)   Current Weight  12/06/19 54.1 kg       Intake/Output from previous day: 11/22 0701 - 11/23 0700 In: 3708.8 [I.V.:2193.9; Blood:315; IV Piggyback:1199.9] Out: 1370 [Urine:1070; Blood:300]   Physical Exam:  Cardiovascular: Slightly bradycardic, systolic murmur Pulmonary: Clear to auscultation bilaterally Abdomen: Soft, non tender, bowel sounds present. Extremities: SCDs in place. Left forearm with superficial skin tear Wounds: Right neck dressing is clean and dry. Right groin wound has light yellow drainage from around suture site.  Lab Results: CBC: Recent Labs    12/05/19 1600 12/05/19 2020  WBC 20.6* 16.8*  HGB 7.7* 8.0*  HCT 25.3* 26.3*  PLT 42* 46*   BMET:  Recent Labs    12/05/19 1400 12/05/19 1600  NA 143 138  K 2.6* 4.4  CL 124* 106  CO2 12* 19*  GLUCOSE 95 148*  BUN 7 12  CREATININE 0.36* 0.58  CALCIUM 4.9* 7.8*    PT/INR:  Lab Results  Component Value Date   INR 1.7 (H) 11/30/2019   INR 1.5 (H) 11/04/2019   INR 1.3 (H) 11/03/2019   ABG:  INR: Will add last result for INR, ABG once components are confirmed Will add last 4 CBG results once components are confirmed  Assessment/Plan:  1. CV - S/p angio vac  for RA mass,TV vegetation (MRSA bacteremia/endocarditis). On Nor epinephrine drip. 2.  Pulmonary - On vent. Management per CCM/pulmonary 3. ID-Leukocytosis. WBC yesterday 16,800. Await this am's result. On Vancomycin 4.  Anemia - H and H yesterday 8 and 26.3. Await this am's result. 5. IVDU-on Methadone 10 mg tid 6. May remove neck 11/26. Regarding right groin drainage there is no hematoma from surgery. 7. Management per CCM et al  Jerline Pain M ZimmermanPA-C 12/06/2019,8:51 AM  Agree with above. Keep dressings on for now. Rest per primary.  Fleetwood Pierron [name]

## 2019-12-06 NOTE — Progress Notes (Signed)
eLink Physician-Brief Progress Note Patient Name: LEEANN BADY DOB: [birth year] MRN: [id number]   Date of Service  12/06/2019  HPI/Events of Note  Patient on Norepinephrine IV infusion via PIV. No order for same.   eICU Interventions  Plan: 1. Norepinephrine IV infusion via PIV. Titrate to MAP >= 65.      Intervention Category Major Interventions: Hypotension - evaluation and management  Jahmeek Shirk Eugene 12/06/2019, 4:50 AM

## 2019-12-06 NOTE — Progress Notes (Signed)
CRITICAL VALUE ALERT  Critical Value: Vanc Trough 40  Date & Time Notied: 12/06/19 @ 1338  Provider Notified: Floor Pharmacist  Orders Received/Actions taken:Hold Next dose of Vanc

## 2019-12-06 NOTE — Progress Notes (Signed)
Hypoglycemic Event  CBG: 60  Treatment:12.5 Dextrose per Protocol  Symptoms:Unable to Assess  Follow-up CBG: Time: 1133 CBG Result:262  Possible Reasons for Event:Poor Intake/Unmet nutritional needs   Comments/MD notified:Smith, MD notified    Amber [name]

## 2019-12-06 NOTE — Progress Notes (Signed)
Multiple attempts made by RT for a SBT on CPAP/PS today with patient. Patient with copious thick, sticky secretions. CCM MD made aware. RT will continue to monitor.

## 2019-12-06 NOTE — Progress Notes (Addendum)
Initial Nutrition Assessment  DOCUMENTATION CODES:   Not applicable  INTERVENTION:   Initiate tube feeding via Cortrak tube when placed tomorrow: Vital AF 1.2 at 50 ml/h (1200 ml per day)  Provides 1440 kcal, 90 gm protein, 973 ml free water daily  Monitor magnesium, potassium, and phosphorus, MD to replete as needed, as pt is at risk for refeeding syndrome given suspected malnutrition with recent poor intake.   NUTRITION DIAGNOSIS:   Inadequate oral intake related to inability to eat as evidenced by NPO status.  GOAL:   Patient will meet greater than or equal to 90% of their needs  MONITOR:   Vent status, TF tolerance, Labs  REASON FOR ASSESSMENT:   Ventilator, Consult Enteral/tube feeding initiation and management  ASSESSMENT:   29 yo female admitted with septic shock, AKI. PMH includes tricuspid MRSA endocarditis, IVDU.   11/22 S/P TEE with angio vac for right atrial mass and tricuspid valve vegetation. Remains on vent post-op with acute lung injury r/t multiple septic emboli.   Cortrak feeding tube has been ordered. Cortrak team available on Wed, 11/24. TF has been ordered, not started yet, RN waiting until Cortrak is placed tomorrow.   Patient is currently intubated on ventilator support, pressors are being weaned.  MV: 9.5 L/min Temp (24hrs), Avg:98.4 F (36.9 C), Min:97.5 F (36.4 C), Max:99.1 F (37.3 C)   Labs reviewed. Phos 5.5 CBG: 104-60-262  Medications reviewed and include colace, methadone, miralax, precedex, levophed.  Usual weights reviewed.  Patient was 49.3 kg 11/07/19, 44 kg on admission 11/18 Weight up to 54.1 kg today I/O +12.9 L since admission  Suspect edema is masking muscle depletion. Suspect patient is malnourished, however, unable to obtain enough information at this time for identification of malnutrition.   NUTRITION - FOCUSED PHYSICAL EXAM:    Most Recent Value  Orbital Region No depletion  Upper Arm Region No depletion   Thoracic and Lumbar Region No depletion  Buccal Region Unable to assess  Temple Region Mild depletion  Clavicle Bone Region Mild depletion  Clavicle and Acromion Bone Region Mild depletion  Scapular Bone Region Unable to assess  Dorsal Hand Unable to assess  Patellar Region No depletion  Anterior Thigh Region No depletion  Posterior Calf Region No depletion  Edema (RD Assessment) Moderate  Hair Reviewed  Eyes Unable to assess  Mouth Unable to assess  Skin Reviewed  Nails Reviewed       Diet Order:   Diet Order            Diet NPO time specified  Diet effective midnight                 EDUCATION NEEDS:   No education needs have been identified at this time  Skin:  Skin Assessment: Skin Integrity Issues: Skin Integrity Issues:: Incisions Incisions: R neck, R groin  Last BM:  11/23  Height:   Ht Readings from Last 1 Encounters:  12/05/19 5\' 2"  (1.575 m)    Weight:   Wt Readings from Last 1 Encounters:  12/06/19 54.1 kg    Ideal Body Weight:  50 kg  BMI:  Body mass index is 21.81 kg/m.  Estimated Nutritional Needs:   Kcal:  1485  Protein:  75-90 gm  Fluid:  >/= 1.5 L    12/08/19, RD, LDN, CNSC Please refer to Amion for contact information.

## 2019-12-06 NOTE — Progress Notes (Signed)
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 Remains on mechanical ventilation and with need for vasopressors. Vancomycin trough found to be 40 and will change to Daptomycin. Creatinine 0.79. Blood cultures from 11/17 were positive. Will recheck blood cultures for clearance of bacteremia. Will need line exchange/holiday when deemed appropriate by Critical Care. Monitor CK levels while on Daptomycin per protocol. Wound care per CVTS.   PLAN:  1. Change vancomycin to Daptomycin. 2. Monitor CK levels per protocol. 3. Mechanical ventilation and pressors per primary team 4. Repeat blood cultures for clearance of bacteremia. 5. Will need line holiday/exchange when deemed safe.    Principal Problem:   MRSA infection Active Problems:   Septic shock (HCC)   Opioid use with withdrawal (HCC)   Endocarditis of tricuspid valve   Palliative care encounter   . sodium chloride   Intravenous Once  . chlorhexidine  15 mL Mouth Rinse BID  . Chlorhexidine Gluconate Cloth  6 each Topical Daily  . docusate sodium  100 mg Oral BID  . enoxaparin (LOVENOX) injection  30 mg Subcutaneous Q24H  . mouth rinse  15 mL Mouth Rinse q12n4p  . methadone  10 mg Per Tube Q8H  . polyethylene glycol  17 g Oral Daily    SUBJECTIVE:  POD #1 from angiovac. Has remained afebrile and currently on mechanical ventilation. Sedation being weaned with possibility of extubation.   Allergies  Allergen Reactions  . Bee Venom Anaphylaxis     Review of Systems: Review of Systems  Unable to perform ROS: Intubated      OBJECTIVE: Vitals:   12/06/19 1124 12/06/19 1130 12/06/19 1145 12/06/19 1200  BP:  (!) 58/41 (!) 146/95 (!) 139/93  Pulse:      Resp: (!) 22 (!) 24 (!) 24 (!) 26  Temp: 99 F (37.2 C)  99 F (37.2 C) 99.1 F (37.3 C) 99.1 F (37.3 C)  TempSrc:      SpO2: 100%     Weight:      Height:       Body mass index is 21.81 kg/m.  Physical Exam Constitutional:      General: She is not in acute distress.    Appearance: She is well-developed. She is cachectic. She is ill-appearing.     Interventions: She is sedated, intubated and restrained.  Cardiovascular:     Rate and Rhythm: Normal rate and regular rhythm.     Heart sounds: Normal heart sounds.  Pulmonary:     Effort: Pulmonary effort is normal. She is intubated.     Breath sounds: Normal breath sounds.  Skin:    General: Skin is warm and dry.  Neurological:     Mental Status: She is oriented to person, place, and time.  Psychiatric:        Behavior: Behavior normal.        Thought Content: Thought content normal.        Judgment: Judgment normal.     Lab Results Lab Results  Component Value Date   WBC 21.7 (H) 12/06/2019   HGB 8.8 (L) 12/06/2019   HCT 28.3 (L) 12/06/2019   MCV 91.0 12/06/2019   PLT 88 (L) 12/06/2019    Lab Results  Component Value Date  CREATININE 0.79 12/06/2019   BUN 29 (H) 12/06/2019   NA 138 12/06/2019   K 4.5 12/06/2019   CL 107 12/06/2019   CO2 18 (L) 12/06/2019    Lab Results  Component Value Date   ALT 60 (H) 12/05/2019   AST 126 (H) 12/05/2019   ALKPHOS 188 (H) 12/05/2019   BILITOT 1.1 12/05/2019     Microbiology: Recent Results (from the past 240 hour(s))  Blood Culture (routine x 2)     Status: Abnormal   Collection Time: 11/30/19 12:28 PM   Specimen: BLOOD  Result Value Ref Range Status   Specimen Description BLOOD CTC  Final   Special Requests   Final    AEROBIC BOTTLE ONLY Blood Culture results may not be optimal due to an inadequate volume of blood received in culture bottles   Culture  Setup Time   Final    AEROBIC BOTTLE ONLY GRAM POSITIVE COCCI Organism ID to follow CRITICAL RESULT CALLED TO, READ BACK BY AND VERIFIED WITH: Melven SartoriusJ LEDFORD [facility]  12/02/19 0211 JDW Performed at [facility], [address] [place], [place] [address]    Culture METHICILLIN RESISTANT STAPHYLOCOCCUS AUREUS (A)  Final   Report Status 12/04/2019 FINAL  Final   Organism ID, Bacteria METHICILLIN RESISTANT STAPHYLOCOCCUS AUREUS  Final      Susceptibility   Methicillin resistant staphylococcus aureus - MIC*    CIPROFLOXACIN >=8 RESISTANT Resistant     ERYTHROMYCIN >=8 RESISTANT Resistant     GENTAMICIN <=0.5 SENSITIVE Sensitive     OXACILLIN >=4 RESISTANT Resistant     TETRACYCLINE 2 SENSITIVE Sensitive     VANCOMYCIN 1 SENSITIVE Sensitive     TRIMETH/SULFA >=320 RESISTANT Resistant     CLINDAMYCIN >=8 RESISTANT Resistant     RIFAMPIN <=0.5 SENSITIVE Sensitive     Inducible Clindamycin NEGATIVE Sensitive     * METHICILLIN RESISTANT STAPHYLOCOCCUS AUREUS  Blood Culture ID Panel (Reflexed)     Status: Abnormal   Collection Time: 11/30/19 12:28 PM  Result Value Ref Range Status   Enterococcus faecalis NOT DETECTED NOT DETECTED Final   Enterococcus Faecium NOT DETECTED NOT DETECTED Final   Listeria monocytogenes NOT DETECTED NOT DETECTED Final   Staphylococcus species DETECTED (A) NOT DETECTED Final    Comment: CRITICAL RESULT CALLED TO, READ BACK BY AND VERIFIED WITH: J LEDFORD PHARMD 12/02/19 0211 JDW    Staphylococcus aureus (BCID) DETECTED (A) NOT DETECTED Final    Comment: Methicillin (oxacillin)-resistant Staphylococcus aureus (MRSA). MRSA is predictably resistant to beta-lactam antibiotics (except ceftaroline). Preferred therapy is vancomycin unless clinically contraindicated. Patient requires contact precautions if  hospitalized. CRITICAL RESULT CALLED TO, READ BACK BY AND VERIFIED WITH: J LEDFORD [facility] 12/02/19 0211 JDW    Staphylococcus epidermidis NOT DETECTED NOT DETECTED Final   Staphylococcus lugdunensis NOT DETECTED NOT DETECTED Final   Streptococcus species NOT DETECTED NOT DETECTED Final   Streptococcus agalactiae NOT DETECTED  NOT DETECTED Final   Streptococcus pneumoniae NOT DETECTED NOT DETECTED Final   Streptococcus pyogenes NOT DETECTED NOT DETECTED Final   A.calcoaceticus-baumannii NOT DETECTED NOT DETECTED Final   Bacteroides fragilis NOT DETECTED NOT DETECTED Final   Enterobacterales NOT DETECTED NOT DETECTED Final   Enterobacter cloacae complex NOT DETECTED NOT DETECTED Final   Escherichia coli NOT DETECTED NOT DETECTED Final   Klebsiella aerogenes NOT DETECTED NOT DETECTED Final   Klebsiella oxytoca NOT DETECTED NOT DETECTED Final   Klebsiella pneumoniae NOT DETECTED NOT DETECTED Final   Proteus species NOT DETECTED NOT  DETECTED Final   Salmonella species NOT DETECTED NOT DETECTED Final   Serratia marcescens NOT DETECTED NOT DETECTED Final   Haemophilus influenzae NOT DETECTED NOT DETECTED Final   Neisseria meningitidis NOT DETECTED NOT DETECTED Final   Pseudomonas aeruginosa NOT DETECTED NOT DETECTED Final   Stenotrophomonas maltophilia NOT DETECTED NOT DETECTED Final   Candida albicans NOT DETECTED NOT DETECTED Final   Candida auris NOT DETECTED NOT DETECTED Final   Candida glabrata NOT DETECTED NOT DETECTED Final   Candida krusei NOT DETECTED NOT DETECTED Final   Candida parapsilosis NOT DETECTED NOT DETECTED Final   Candida tropicalis NOT DETECTED NOT DETECTED Final   Cryptococcus neoformans/gattii NOT DETECTED NOT DETECTED Final   Meth resistant mecA/C and MREJ DETECTED (A) NOT DETECTED Final    Comment: CRITICAL RESULT CALLED TO, READ BACK BY AND VERIFIED WITHMelven Sartorius [facility] 12/02/19 0211 JDW Performed at [facility], [address] [place], [place] [address]   Respiratory Panel by RT PCR (Flu A&B, Covid) - Nasopharyngeal Swab     Status: None   Collection Time: 11/30/19 12:53 PM   Specimen: Nasopharyngeal Swab  Result Value Ref Range Status   SARS Coronavirus 2 by RT PCR NEGATIVE NEGATIVE Final    Comment: (NOTE) SARS-CoV-2 target nucleic acids are NOT DETECTED.  The  SARS-CoV-2 RNA is generally detectable in upper respiratoy specimens during the acute phase of infection. The lowest concentration of SARS-CoV-2 viral copies this assay can detect is 131 copies/mL. A negative result does not preclude SARS-Cov-2 infection and should not be used as the sole basis for treatment or other patient management decisions. A negative result may occur with  improper specimen collection/handling, submission of specimen other than nasopharyngeal swab, presence of viral mutation(s) within the areas targeted by this assay, and inadequate number of viral copies (<131 copies/mL). A negative result must be combined with clinical observations, patient history, and epidemiological information. The expected result is Negative.  Fact Sheet for Patients:  https://www.moore.com/  Fact Sheet for Healthcare Providers:  https://www.young.biz/  This test is no t yet approved or cleared by the [place] FDA and  has been authorized for detection and/or diagnosis of SARS-CoV-2 by FDA under an Emergency Use Authorization (EUA). This EUA will remain  in effect (meaning this test can be used) for the duration of the COVID-19 declaration under Section 564(b)(1) of the Act, 21 U.S.C. section 360bbb-3(b)(1), unless the authorization is terminated or revoked sooner.     Influenza A by PCR NEGATIVE NEGATIVE Final   Influenza B by PCR NEGATIVE NEGATIVE Final    Comment: (NOTE) The Xpert Xpress SARS-CoV-2/FLU/RSV assay is intended as an aid in  the diagnosis of influenza from Nasopharyngeal swab specimens and  should not be used as a sole basis for treatment. Nasal washings and  aspirates are unacceptable for Xpert Xpress SARS-CoV-2/FLU/RSV  testing.  Fact Sheet for Patients: https://www.moore.com/  Fact Sheet for Healthcare Providers: https://www.young.biz/  This test is not yet approved or cleared  by the [place] FDA and  has been authorized for detection and/or diagnosis of SARS-CoV-2 by  FDA under an Emergency Use Authorization (EUA). This EUA will remain  in effect (meaning this test can be used) for the duration of the  Covid-19 declaration under Section 564(b)(1) of the Act, 21  U.S.C. section 360bbb-3(b)(1), unless the authorization is  terminated or revoked. Performed at [facility], [address], [place], [place] [address]   MRSA PCR Screening  Status: Abnormal   Collection Time: 11/30/19  4:58 PM   Specimen: Nasopharyngeal  Result Value Ref Range Status   MRSA by PCR POSITIVE (A) NEGATIVE Final    Comment:        The GeneXpert MRSA Assay (FDA approved for NASAL specimens only), is one component of a comprehensive MRSA colonization surveillance program. It is not intended to diagnose MRSA infection nor to guide or monitor treatment for MRSA infections. RESULT CALLED TO, READ BACK BY AND VERIFIED WITH: Brooke Dare RN 11/30/19 AT 2231 SK Performed at [facility], [address], [place], [place] [address]   Blood Culture (routine x 2)     Status: Abnormal   Collection Time: 11/30/19  5:46 PM   Specimen: BLOOD RIGHT HAND  Result Value Ref Range Status   Specimen Description BLOOD RIGHT HAND  Final   Special Requests   Final    BOTTLES DRAWN AEROBIC AND ANAEROBIC Blood Culture results may not be optimal due to an inadequate volume of blood received in culture bottles   Culture  Setup Time   Final    GRAM POSITIVE COCCI IN BOTH AEROBIC AND ANAEROBIC BOTTLES CRITICAL VALUE NOTED.  VALUE IS CONSISTENT WITH PREVIOUSLY REPORTED AND CALLED VALUE.    Culture (A)  Final    STAPHYLOCOCCUS AUREUS SUSCEPTIBILITIES PERFORMED ON PREVIOUS CULTURE WITHIN THE LAST 5 DAYS. Performed at [facility], [address], [place], [place] [address]    Report Status 12/04/2019 FINAL  Final  Aerobic/Anaerobic Culture (surgical/deep wound)     Status: None  (Preliminary result)   Collection Time: 12/05/19 10:02 AM   Specimen: PATH Soft tissue  Result Value Ref Range Status   Specimen Description VALVE  Final   Special Requests A TRICUSPID VALVE VEGETATION  Final   Gram Stain   Final    ABUNDANT WBC PRESENT, PREDOMINANTLY PMN ABUNDANT GRAM POSITIVE COCCI    Culture   Final    ABUNDANT STAPHYLOCOCCUS AUREUS CULTURE REINCUBATED FOR BETTER GROWTH SUSCEPTIBILITIES TO FOLLOW Performed at [facility], [address] [place], [place] [address]    Report Status PENDING  Incomplete     Marcos Eke, NP [facility]  12/06/2019  2:38 PM

## 2019-12-07 DIAGNOSIS — R6520 Severe sepsis without septic shock: Secondary | ICD-10-CM

## 2019-12-07 DIAGNOSIS — Z7189 Other specified counseling: Secondary | ICD-10-CM

## 2019-12-07 DIAGNOSIS — I76 Septic arterial embolism: Secondary | ICD-10-CM

## 2019-12-07 DIAGNOSIS — A4902 Methicillin resistant Staphylococcus aureus infection, unspecified site: Secondary | ICD-10-CM

## 2019-12-07 LAB — HEPATIC FUNCTION PANEL
ALT: 50 U/L — ABNORMAL HIGH (ref 0–44)
AST: 112 U/L — ABNORMAL HIGH (ref 15–41)
Albumin: 1.7 g/dL — ABNORMAL LOW (ref 3.5–5.0)
Alkaline Phosphatase: 120 U/L (ref 38–126)
Bilirubin, Direct: 0.5 mg/dL — ABNORMAL HIGH (ref 0.0–0.2)
Indirect Bilirubin: 0.9 mg/dL (ref 0.3–0.9)
Total Bilirubin: 1.4 mg/dL — ABNORMAL HIGH (ref 0.3–1.2)
Total Protein: 5.2 g/dL — ABNORMAL LOW (ref 6.5–8.1)

## 2019-12-07 LAB — CBC
HCT: 27.4 % — ABNORMAL LOW (ref 36.0–46.0)
Hemoglobin: 8.5 g/dL — ABNORMAL LOW (ref 12.0–15.0)
MCH: 29 pg (ref 26.0–34.0)
MCHC: 31 g/dL (ref 30.0–36.0)
MCV: 93.5 fL (ref 80.0–100.0)
Platelets: 102 10*3/uL — ABNORMAL LOW (ref 150–400)
RBC: 2.93 MIL/uL — ABNORMAL LOW (ref 3.87–5.11)
RDW: 26 % — ABNORMAL HIGH (ref 11.5–15.5)
WBC: 20.9 10*3/uL — ABNORMAL HIGH (ref 4.0–10.5)
nRBC: 0.4 % — ABNORMAL HIGH (ref 0.0–0.2)

## 2019-12-07 LAB — GLUCOSE, CAPILLARY
Glucose-Capillary: 107 mg/dL — ABNORMAL HIGH (ref 70–99)
Glucose-Capillary: 120 mg/dL — ABNORMAL HIGH (ref 70–99)
Glucose-Capillary: 143 mg/dL — ABNORMAL HIGH (ref 70–99)
Glucose-Capillary: 62 mg/dL — ABNORMAL LOW (ref 70–99)
Glucose-Capillary: 74 mg/dL (ref 70–99)
Glucose-Capillary: 75 mg/dL (ref 70–99)

## 2019-12-07 LAB — BASIC METABOLIC PANEL
Anion gap: 11 (ref 5–15)
BUN: 31 mg/dL — ABNORMAL HIGH (ref 6–20)
CO2: 19 mmol/L — ABNORMAL LOW (ref 22–32)
Calcium: 7.6 mg/dL — ABNORMAL LOW (ref 8.9–10.3)
Chloride: 109 mmol/L (ref 98–111)
Creatinine, Ser: 0.81 mg/dL (ref 0.44–1.00)
GFR, Estimated: >60 mL/min (ref >60)
Glucose, Bld: 127 mg/dL — ABNORMAL HIGH (ref 70–99)
Potassium: 3.7 mmol/L (ref 3.5–5.1)
Sodium: 139 mmol/L (ref 135–145)

## 2019-12-07 LAB — CK: Total CK: 96 U/L (ref 38–234)

## 2019-12-07 LAB — MAGNESIUM
Magnesium: 1.8 mg/dL (ref 1.7–2.4)
Magnesium: 1.6 mg/dL — ABNORMAL LOW (ref 1.7–2.4)

## 2019-12-07 LAB — PHOSPHORUS
Phosphorus: 4.3 mg/dL (ref 2.5–4.6)
Phosphorus: 3.5 mg/dL (ref 2.5–4.6)

## 2019-12-07 MED ORDER — SODIUM CHLORIDE 0.9% FLUSH
10.0000 mL | INTRAVENOUS | Status: DC | PRN
  Administered 2020-01-02 – 2020-01-04 (×2): 10 mL

## 2019-12-07 MED ORDER — SODIUM CHLORIDE 0.9% FLUSH
10.0000 mL | Freq: Two times a day (BID) | INTRAVENOUS | Status: DC
  Administered 2019-12-07: 30 mL
  Administered 2019-12-07 – 2020-01-16 (×33): 10 mL

## 2019-12-07 MED ORDER — JEVITY 1.2 CAL PO LIQD
1000.0000 mL | ORAL | Status: DC
  Administered 2019-12-07: 25 mL/h
  Administered 2019-12-09 – 2019-12-10 (×3): 1000 mL
  Filled 2019-12-07 (×7): qty 1000

## 2019-12-07 MED ORDER — HYDROMORPHONE HCL 1 MG/ML IJ SOLN
1.0000 mg | INTRAMUSCULAR | Status: DC | PRN
  Administered 2019-12-07 – 2019-12-09 (×8): 1 mg via INTRAVENOUS
  Filled 2019-12-07 (×9): qty 1

## 2019-12-07 MED ORDER — OXYCODONE HCL 5 MG PO TABS
5.0000 mg | ORAL_TABLET | Freq: Three times a day (TID) | ORAL | Status: DC | PRN
  Administered 2019-12-07: 5 mg via ORAL
  Filled 2019-12-07: qty 1

## 2019-12-07 MED ORDER — ALBUMIN HUMAN 25 % IV SOLN
25.0000 g | Freq: Four times a day (QID) | INTRAVENOUS | Status: AC
  Administered 2019-12-07 – 2019-12-08 (×4): 25 g via INTRAVENOUS
  Filled 2019-12-07 (×4): qty 100

## 2019-12-07 MED ORDER — FUROSEMIDE 10 MG/ML IJ SOLN
20.0000 mg | Freq: Once | INTRAMUSCULAR | Status: AC
  Administered 2019-12-07: 20 mg via INTRAVENOUS
  Filled 2019-12-07: qty 2

## 2019-12-07 MED ORDER — FUROSEMIDE 10 MG/ML IJ SOLN
40.0000 mg | Freq: Once | INTRAMUSCULAR | Status: AC
  Administered 2019-12-07: 40 mg via INTRAVENOUS
  Filled 2019-12-07: qty 4

## 2019-12-07 MED ORDER — METHADONE HCL 5 MG PO TABS
10.0000 mg | ORAL_TABLET | Freq: Three times a day (TID) | ORAL | Status: DC
  Administered 2019-12-07 – 2019-12-08 (×2): 10 mg via ORAL
  Filled 2019-12-07 (×3): qty 2

## 2019-12-07 NOTE — Procedures (Signed)
Extubation Procedure Note  Patient Details:   Name: Amber [name] DOB: [birth year] MRN: [id number]   Airway Documentation:    Vent end date: 12/07/19 Vent end time: 0835   Evaluation  O2 sats: stable throughout Complications: No apparent complications Patient did tolerate procedure well. Bilateral Breath Sounds: Rhonchi   Yes   Pt was extubated per MD order and placed on 4 L [place]. Cuff leak was noted prior to extubation and no stridor post. Pt is stable at this time. RT will monitor.   Merlene Laughter 12/07/2019, 8:44 AM

## 2019-12-07 NOTE — Progress Notes (Addendum)
12/07/2019  I saw and evaluated the patient. Discussed with resident and agree with resident's findings and plan as documented in the resident's note.  I have seen and evaluated the patient for shock, respiratory failure, and endocarditis.  S:  No events, awake on vent, wants tube out.  O: Blood pressure 100/65, pulse 85, temperature 98.8 F (37.1 C), resp. rate (!) 26, height 5\' 2"  (1.575 m), weight 54.1 kg, SpO2 93 %.  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 s/p valve/atrium vegetation debulking - Postoperative respiratory failure improving - Postoperative shock, distributive and sedation related improving - Active IVDA and opiate dependence - Severe protein calorie malnutrition, third spacing - R femoral seroma from third spacing  P:  - Wean pressors as able - SAT/SBT and potential extubation, IS, flutter as able - Continue methadone with dilaudid for breakthrough, wean precedex - Would give TF given poor PO and severe protein malnutrition - Albumin/lasix to alleviate third spacing - Guarded prognosis  Patient critically ill due to shock, endocarditis, respiratory failure Interventions to address this today pressor and ventilation titration Risk of deterioration without these interventions is high  I personally spent 35 minutes providing critical care not including any separately billable procedures  MD [facility] 12/07/2019 10:15 AM Personal pager: 12/09/2019 If unanswered, please page CCM On-call: #[phone]      NAME:  Amber [name], MRN:  Amber [name], DOB:  July 10, 1990, LOS: 7 ADMISSION DATE:  11/30/2019, CONSULTATION DATE:  12/07/2019  REFERRING MD:  12/09/2019, EDP, CHIEF COMPLAINT: Generalized  weakness, hypotension  Brief History   29 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  Seen in consultation by cardiology, cardiothoracic surgery and infectious disease.  Not felt to require surgery/also not a candidate given ongoing IVDU.  She signed out AMA.  Has been living with her best friend since. Mom reports ongoing IVDU.  She had to be removed from her vehicle due to lethargy, needles noted on her arm by triage nurse.  She reports progressive weakness and pain in her sides.  Hypotensive in the ED, received 2 L of fluids and empiric Zosyn/vancomycin.  Past Medical History  IVDU  Significant Hospital Events   10/23-26 hospital admit for endocarditis, signed out [facility] 11/22 angiovac 11/23 off Ketamine drip 11/24 extubated Consults:  CT surgery Infectious disease  Procedures:  11/22 angiovac  Significant Diagnostic Tests:  CXR 11/17: Persistent bilateral nodular opacities reflecting septic emboli seen on prior chest CT. Echo 11/17 >> 2.36 cm X 1.24c vegetation on the tricuspid valve; predominantely on the septal and posterior leaflets, with severe regurgitation.   MR lumbar/thoracic spine 10/22 >> no evidence of discitis or osteomyelitis CT angio chest 10/21 >> numerous nodules with some cavitation, consistent with septic emboli CT foot left/CT right ankle >> evidence of cellulitis, no osteomyelitis  Micro Data:  Blood cultures 11/13 >> Blood cultures 11/17 >>MRSA,  MRSA PCR >> postive Antimicrobials:  Zosyn 11/17 >>11/18 Vanco 11/17 >> 11/23 Daptomycin >>11/23 Interim history/subjective:  Overnight with withdrawal symptoms given ativan with improvement. This morning  sleeping comfortably. Awakens to voice and following commands.  Objective    Blood pressure 103/65, pulse 76, temperature 99 F (37.2 C), resp. rate (!) 25, height 5\' 2"  (1.575 m), weight 54.1 kg, SpO2 97 %. 2L [place]     Intake/Output Summary (Last 24 hours) at 12/07/2019 0714 Last data filed at 12/07/2019 0600 Gross per 24 hour  Intake 865.32 ml  Output 1080 ml  Net -214.68 ml   Filed Weights   12/05/19 0432 12/06/19 0500 12/07/19 0440  Weight: 54.1 kg 54.1 kg 54.1 kg    Examination: General: chronically ill woman, no acute distress HENT: Hair matted, colored, Lungs: Extubated this morning, breathing comfortably on 4L,  no accessory muscle use Cardiovascular: regular, systolic murmur present, 2+ edema bialterally Abdomen: Soft, +BS, mild distention Skin: Track marks in both hands and arms, ecchymoses LUE with skin sloughing, Surgical wounds clean and dressed Neuro: Awake and alert, following commands, moving all extermities  Resolved Hospital Problem list   AKI  Assessment & Plan:  MRSA bacteremia and tricusid valve endocarditis s/p angiovac Septic shock Postoperative shock Maps between overnight. Cultures positive for MRSA. Sp angiovac 11/22. CK normal - Switched to daptomycin due to elevated vancomycin troughs,  - follow up repeat blood cultures for clearance of bacteremia - ID consulted appreciate recommendations - Surgical wound care per CT surgery  Postoperative hypercarbic respiratory failure  - extubated this morning - Maintain O2 above 90% - May need trach if she does not tolerate   Anemia, Thrombocytopenia 2/2 MRSA sepsis Hgb stable 8.5 today -Transfuse if Hgb<7 -Platelets 74 this morning, continue to monitor for bleeding  IVDA, opiod dependence - Methadone for pain control 10 mg TID - oxycodone PRN  Protein calorie malnutrition - Low BMI, with poor oral intake even prior to intubation - cortrak to be placed today, start on tube feeds  IV infiltration LUE with infiltrated IV with skin breakdown - wound care  Best practice:   Diet: Regular diet. Start tube feeds Pain/Anxiety/Delirium protocol (if indicated):Methadone, ativan, oxycodone VAP protocol (if indicated): Yes DVT prophylaxis: Lovenox GI prophylaxis: N/A Glucose control: CBGs Mobility: Bedrest Code Status: Full Family Communication: Will update mother Disposition: ICU  12/22 PGY-1 Internal Medicine 12/07/19 7:14 AM  Labs   CBC: Recent Labs  Lab 11/30/19 1226 11/30/19 1252 12/05/19 1400 12/05/19 1600 12/05/19 2020 12/06/19 1253 12/07/19 0405  WBC 16.0*   < > 20.9* 20.6* 16.8* 21.7* 20.9*  NEUTROABS 12.7*  --   --  18.5*  --   --   --   HGB 6.4*   < > 6.6* 7.7* 8.0* 8.8* 8.5*  HCT 21.9*   < > 21.8* 25.3* 26.3* 28.3* 27.4*  MCV 83.9   < > 92.4 93.0 92.0 91.0 93.5  PLT 47*   < > 37* 42* 46* 88* 102*   < > = values in this interval not displayed.    Basic Metabolic Panel: Recent Labs  Lab 12/01/19 0405 12/02/19 0129 12/05/19 0120 12/05/19 0826 12/05/19 0840 12/05/19 1012 12/05/19 1049 12/05/19 1400 12/05/19 1600 12/06/19 0929 12/06/19 1700 12/07/19 0405  NA 132*   < > 135   < > 138   < > 139 143 138 138  --  139  K 3.6   < > 3.9   < > 4.1   < > 5.7* 2.6* 4.4 4.5  --  3.7  CL 100   < > 103  --  103  --   --  124* 106  107  --  109  CO2 20*   < > 18*  --   --   --   --  12* 19* 18*  --  19*  GLUCOSE 103*   < > 92  --  93  --   --  95 148* 126*  --  127*  BUN 43*   < > 9  --  10  --   --  7 12 29*  --  31*  CREATININE 0.97   < > 0.56  --  0.30*  --   --  0.36* 0.58 0.79  --  0.81  CALCIUM 7.6*   < > 7.7*  --   --   --   --  4.9* 7.8* 7.8*  --  7.6*  MG 2.2  --   --   --   --   --   --   --   --  2.1 1.8 1.8  PHOS 4.6  --   --   --   --   --   --   --   --  5.5* 4.7* 4.3   < > = values in this interval not displayed.   GFR: Estimated Creatinine Clearance: 81.1 mL/min (by C-G formula based on SCr of 0.81 mg/dL). Recent Labs  Lab 11/30/19 1226 12/01/19 0006 12/01/19 0405 12/01/19 0857 12/02/19 0129 12/02/19 2119  12/05/19 0120 12/05/19 1600 12/05/19 2020 12/06/19 1253 12/07/19 0405  WBC   < >  --  23.8*  --    < >  --    < > 20.6* 16.8* 21.7* 20.9*  LATICACIDVEN  --  3.9* 2.7* 2.6*  --  1.4  --   --   --   --   --    < > = values in this interval not displayed.    Liver Function Tests: Recent Labs  Lab 11/30/19 1226 12/03/19 0319 12/05/19 0120 12/07/19 0405  AST 37 416* 126* 112*  ALT 14 99* 60* 50*  ALKPHOS 94 129* 188* 120  BILITOT 2.0* 1.7* 1.1 1.4*  PROT 5.8* 5.3* 6.0* 5.2*  ALBUMIN 1.6* 1.4* 1.5* 1.7*   No results for input(s): LIPASE, AMYLASE in the last 168 hours. No results for input(s): AMMONIA in the last 168 hours.  ABG    Component Value Date/Time   PHART 7.245 (L) 12/05/2019 1049   PCO2ART 51.7 (H) 12/05/2019 1049   PO2ART 298 (H) 12/05/2019 1049   HCO3 22.9 12/05/2019 1049   TCO2 25 12/05/2019 1049   ACIDBASEDEF 5.0 (H) 12/05/2019 1049   O2SAT 100.0 12/05/2019 1049     Coagulation Profile: Recent Labs  Lab 11/30/19 1226  INR 1.7*    Cardiac Enzymes: Recent Labs  Lab 12/07/19 0405  CKTOTAL 96    HbA1C: Hgb A1c MFr Bld  Date/Time Value Ref Range Status  11/04/2019 04:34 AM 6.3 (H) 4.8 - 5.6 % Final    Comment:    (NOTE)         Prediabetes: 5.7 - 6.4         Diabetes: >6.4         Glycemic control for adults with diabetes: <7.0     CBG: Recent Labs  Lab 12/06/19 1112 12/06/19 1133 12/06/19 1517 12/06/19 1945 12/06/19 2335  GLUCAP 60* 262* 117* 98 109*

## 2019-12-07 NOTE — Progress Notes (Signed)
Daily Progress Note   Patient Name: Amber [name]       Date: 12/07/2019 DOB: May 25, 1990  Age: 29 y.o. MRN#: [id number] Attending Physician: Lorin Glass, MD Primary Care Physician: Patient, No Pcp Per Admit Date: 11/30/2019  Reason for Consultation/Follow-up: Goals of care  Subjective: Patient extubated this afternoon and stable on 4L . She is awake, alert but minimally engages in conversation. She is hungry and asking for food. RN at bedside and about to attempt bedside swallow evaluation.   GOC:  Mother, Corrie Dandy at bedside. Introduced myself with palliative medicine. She is appreciative of visit and familiar with palliative from conversation with Norvel Richards PA. Updated her on Annamay's condition including diagnoses, interventions, plan of care. Reviewed ID note. Mother asks about rehab. Explained that Sameera is weak and deconditioned (day 7 hospitalization) and will likely need rehab on discharge to build strength and also continue to receive IV antibiotics. PCCM MD also at bedside and provided mother an update. Mother is hopeful to keep heading in the right direction towards recovery for [facility].   Reassured of ongoing support from PMT this admission. Mother has PMT contact information.   Length of Stay: 7   Vital Signs: BP 106/69   Pulse 78   Temp 98.2 F (36.8 C)   Resp (!) 21   Ht 5\' 2"  (1.575 m)   Wt 54.1 kg Comment: bed broken  LMP  (LMP Unknown)   SpO2 98%   BMI 21.81 kg/m  SpO2: SpO2: 98 % O2 Device: O2 Device: Nasal Cannula O2 Flow Rate: O2 Flow Rate (L/min): 4 L/min       Palliative Assessment/Data: 40%     Palliative Care Plan   Patient Profile/HPI: 29 y.o. female  with past medical history of heroin use, MRSA bacteremia, tricuspid endocarditis with  pulmonary septic emboli who left [facility] twice during the week of October 25 , 2021 was admitted on 11/30/2019 with worsening weakness.  She was found to be in septic shock with acute kidney injury, and severe thrombocytopenia.    Patient's tricuspid valve vegetation has worsened since her 10/22 echo.  11/17 echo showed greater than 1 cm vegetation on the tricuspid valve, with a slightly smaller anterior leaflet vegetation. There is lack of coaptation and severe regurgitation.   Tricuspid valve regurgitation is severe.  As patient has an on-going addiction problem she is not currently a candidate for valve replacement surgery.  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 Patient extubated and stable this afternoon. PT/OT/SLP attempts when appropriate. PMT will continue to follow inpatient.   Code Status:  Full code  Prognosis: Guarded  Discharge Planning: To Be Determined   Thank you for allowing the Palliative Medicine Team to assist in the care of this patient.  Total time spent:  30  Greater than 50% of this time was spent counseling and coordinating care related to the above assessment and plan.   Vennie Homans, DNP, FNP-C Palliative Medicine Team  Phone: [phone] Fax: [phone]  Please contact Palliative MedicineTeam phone at [phone] for questions and concerns between 7 am - 7 pm.   Please see AMION for individual provider pager numbers.

## 2019-12-07 NOTE — Progress Notes (Signed)
Initial Nutrition Assessment  DOCUMENTATION CODES:   Not applicable  INTERVENTION:   Initiate tube feeding via Cortrak tube when placed today: Jevity 1.2 at 25 ml/h, increase by 10 ml every 8 hours to goal rate of 65 ml/h (1560 ml per day)  Provides 1872 kcal, 87 gm protein, 1264 ml free water daily  Monitor magnesium, potassium, and phosphorus, MD to replete as needed, as pt is at risk for refeeding syndrome given suspected malnutrition with recent poor intake.   NUTRITION DIAGNOSIS:   Inadequate oral intake related to inability to eat as evidenced by NPO status.  Ongoing  GOAL:   Patient will meet greater than or equal to 90% of their needs   Unmet  MONITOR:   Vent status, TF tolerance, Labs  REASON FOR ASSESSMENT:   Ventilator, Consult Enteral/tube feeding initiation and management  ASSESSMENT:   29 yo female admitted with septic shock, AKI. PMH includes tricuspid MRSA endocarditis, IVDU.   Intake since admission has been poor (full liquids x 1 full day and regular diet x 3 days prior to intubation). Cortrak ordered 11/23, to be placed today for TF. Patient was extubated this morning. Levophed is currently off.  Remains NPO at this time.  Labs reviewed.  CBG: [id number]  Medications reviewed and include colace, lasix, methadone, miralax, precedex.  Weight stable at 54.1 kg today I/O +11.9 L since admission   Diet Order:   Diet Order            Diet NPO time specified  Diet effective midnight                 EDUCATION NEEDS:   No education needs have been identified at this time  Skin:  Skin Assessment: Skin Integrity Issues: Skin Integrity Issues:: Incisions Incisions: R neck, R groin  Last BM:  11/24 small  Height:   Ht Readings from Last 1 Encounters:  12/05/19 5\' 2"  (1.575 m)    Weight:   Wt Readings from Last 1 Encounters:  12/07/19 54.1 kg    Ideal Body Weight:  50 kg  BMI:  Body mass index is 21.81 kg/m.  Estimated  Nutritional Needs:   Kcal:  1800-2000  Protein:  80-95 gm  Fluid:  >/= 1.5 L    12/09/19, RD, LDN, CNSC Please refer to Amion for contact information.

## 2019-12-07 NOTE — Procedures (Signed)
Cortrak  Person Inserting Tube:  Ayva Veilleux E, RD Tube Type:  Cortrak - 43 inches Tube Location:  Right nare Initial Placement:  Stomach Secured by: Bridle Technique Used to Measure Tube Placement:  Documented cm marking at nare/ corner of mouth Cortrak Secured At:  65 cm    Cortrak Tube Team Note:  Consult received to place a Cortrak feeding tube.   No x-ray is required. RN may begin using tube.    If the tube becomes dislodged please keep the tube and contact the Cortrak team at [email or web address] (password [email or web address]) for replacement.  If after hours and replacement cannot be delayed, place a NG tube and confirm placement with an abdominal x-ray.    Ahonesty Woodfin, MS, RD, LDN RD pager number and weekend/on-call pager number located in Amion.   

## 2019-12-07 NOTE — Progress Notes (Signed)
[facility]  Date of Admission:  11/30/2019     Total days of antibiotics 7         ASSESSMENT: 29 yo female ivdu with mrsa tv endocarditis first engaged in [facility] mid October, but had left [facility] a few times, readmitted again 11/22 with septic shock and persistent mrsa bacteremia  Patient is now s/p angiovac 11/22 Extubated 11/24  11/17 bcx mrsa Repeat bcx in progress. Worried for metastatic seeding such as vertebral om or intraabd abscess or cns process (able to communicate a little less worrisome for the latter)  Unclear what the lue rash is, but could consider imaging if becoming indurated. So far primary team presume LR infiltration but will keep wary for septic emboli with infarcted soft tissue  PLAN:  1. Continue daptomycin 2. Will review symptoms once she is more alert 3. Not opat candidate; ideally will need rehab/inpatient stay for iv abx. Anticipate at least 6 weeks, but if vertebral om or intraabd abscess could be longer 4. Weekly cpk, cbc, cmp  5.   F/u repeat bcx; no central line until persistent negative bcx of 72 hours  Principal Problem:   MRSA infection Active Problems:   Septic shock (HCC)   Opioid use with withdrawal (HCC)   Endocarditis of tricuspid valve   Palliative care encounter   . sodium chloride   Intravenous Once  . chlorhexidine  15 mL Mouth Rinse BID  . Chlorhexidine Gluconate Cloth  6 each Topical Daily  . docusate sodium  100 mg Oral BID  . enoxaparin (LOVENOX) injection  30 mg Subcutaneous Q24H  . mouth rinse  15 mL Mouth Rinse q12n4p  . methadone  10 mg Per Tube Q8H  . polyethylene glycol  17 g Oral Daily  . sodium chloride flush  10-40 mL Intracatheter Q12H    SUBJECTIVE: 11/17 readmission; positive blood culture 11/22 angiovac; clot growing staph aureus 11/23 repeat bcx ngtd 11/24 extubated today; abx to be switched to daptomycin today  Afebrile/hds    Allergies  Allergen Reactions   . Bee Venom Anaphylaxis     Review of Systems: Reports being hungry; unable to keep good mentation to engage in sustained history   OBJECTIVE: Vitals:   12/07/19 0600 12/07/19 0700 12/07/19 0754 12/07/19 0835  BP: 103/65 103/64    Pulse: 76  74   Resp: (!) 25 (!) 24    Temp: 99 F (37.2 C) 99 F (37.2 C)    TempSrc:      SpO2: 97%  100% 96%  Weight:      Height:       Body mass index is 21.81 kg/m.   Physical Exam: Chronically ill appearing, thin, no distress; arousable but unable to sustain conversation more than several seconds Heent: anasarca more in LE, normocephalic; per; conj clear Neck supple cv rrr, systolic murmur Skin multiple track marks upper ext; indurated/purpuric left forearm with superficial skin sloughing, warm. Well demarcated Neuro lethargic but awake and able to follows commands, moving extremities without obvious assymetry   Lab Results Lab Results  Component Value Date   WBC 20.9 (H) 12/07/2019   HGB 8.5 (L) 12/07/2019   HCT 27.4 (L) 12/07/2019   MCV 93.5 12/07/2019   PLT 102 (L) 12/07/2019    Lab Results  Component Value Date   CREATININE 0.81 12/07/2019   BUN 31 (H) 12/07/2019   NA 139 12/07/2019   K 3.7 12/07/2019   CL 109 12/07/2019  CO2 19 (L) 12/07/2019    Lab Results  Component Value Date   ALT 50 (H) 12/07/2019   AST 112 (H) 12/07/2019   ALKPHOS 120 12/07/2019   BILITOT 1.4 (H) 12/07/2019     Microbiology: Recent Results (from the past 240 hour(s))  Blood Culture (routine x 2)     Status: Abnormal   Collection Time: 11/30/19 12:28 PM   Specimen: BLOOD  Result Value Ref Range Status   Specimen Description BLOOD CTC  Final   Special Requests   Final    AEROBIC BOTTLE ONLY Blood Culture results may not be optimal due to an inadequate volume of blood received in culture bottles   Culture  Setup Time   Final    AEROBIC BOTTLE ONLY GRAM POSITIVE COCCI Organism ID to follow CRITICAL RESULT CALLED TO, READ BACK BY AND  VERIFIED WITH: Melven Sartorius [facility] 12/02/19 0211 JDW Performed at [facility], [address] [place], [place] [address]    Culture METHICILLIN RESISTANT STAPHYLOCOCCUS AUREUS (A)  Final   Report Status 12/04/2019 FINAL  Final   Organism ID, Bacteria METHICILLIN RESISTANT STAPHYLOCOCCUS AUREUS  Final      Susceptibility   Methicillin resistant staphylococcus aureus - MIC*    CIPROFLOXACIN >=8 RESISTANT Resistant     ERYTHROMYCIN >=8 RESISTANT Resistant     GENTAMICIN <=0.5 SENSITIVE Sensitive     OXACILLIN >=4 RESISTANT Resistant     TETRACYCLINE 2 SENSITIVE Sensitive     VANCOMYCIN 1 SENSITIVE Sensitive     TRIMETH/SULFA >=320 RESISTANT Resistant     CLINDAMYCIN >=8 RESISTANT Resistant     RIFAMPIN <=0.5 SENSITIVE Sensitive     Inducible Clindamycin NEGATIVE Sensitive     * METHICILLIN RESISTANT STAPHYLOCOCCUS AUREUS  Blood Culture ID Panel (Reflexed)     Status: Abnormal   Collection Time: 11/30/19 12:28 PM  Result Value Ref Range Status   Enterococcus faecalis NOT DETECTED NOT DETECTED Final   Enterococcus Faecium NOT DETECTED NOT DETECTED Final   Listeria monocytogenes NOT DETECTED NOT DETECTED Final   Staphylococcus species DETECTED (A) NOT DETECTED Final    Comment: CRITICAL RESULT CALLED TO, READ BACK BY AND VERIFIED WITH: J LEDFORD PHARMD 12/02/19 0211 JDW    Staphylococcus aureus (BCID) DETECTED (A) NOT DETECTED Final    Comment: Methicillin (oxacillin)-resistant Staphylococcus aureus (MRSA). MRSA is predictably resistant to beta-lactam antibiotics (except ceftaroline). Preferred therapy is vancomycin unless clinically contraindicated. Patient requires contact precautions if  hospitalized. CRITICAL RESULT CALLED TO, READ BACK BY AND VERIFIED WITH: J LEDFORD [facility] 12/02/19 0211 JDW    Staphylococcus epidermidis NOT DETECTED NOT DETECTED Final   Staphylococcus lugdunensis NOT DETECTED NOT DETECTED Final   Streptococcus species NOT DETECTED NOT DETECTED Final    Streptococcus agalactiae NOT DETECTED NOT DETECTED Final   Streptococcus pneumoniae NOT DETECTED NOT DETECTED Final   Streptococcus pyogenes NOT DETECTED NOT DETECTED Final   A.calcoaceticus-baumannii NOT DETECTED NOT DETECTED Final   Bacteroides fragilis NOT DETECTED NOT DETECTED Final   Enterobacterales NOT DETECTED NOT DETECTED Final   Enterobacter cloacae complex NOT DETECTED NOT DETECTED Final   Escherichia coli NOT DETECTED NOT DETECTED Final   Klebsiella aerogenes NOT DETECTED NOT DETECTED Final   Klebsiella oxytoca NOT DETECTED NOT DETECTED Final   Klebsiella pneumoniae NOT DETECTED NOT DETECTED Final   Proteus species NOT DETECTED NOT DETECTED Final   Salmonella species NOT DETECTED NOT DETECTED Final   Serratia marcescens NOT DETECTED NOT DETECTED Final   Haemophilus influenzae NOT DETECTED  NOT DETECTED Final   Neisseria meningitidis NOT DETECTED NOT DETECTED Final   Pseudomonas aeruginosa NOT DETECTED NOT DETECTED Final   Stenotrophomonas maltophilia NOT DETECTED NOT DETECTED Final   Candida albicans NOT DETECTED NOT DETECTED Final   Candida auris NOT DETECTED NOT DETECTED Final   Candida glabrata NOT DETECTED NOT DETECTED Final   Candida krusei NOT DETECTED NOT DETECTED Final   Candida parapsilosis NOT DETECTED NOT DETECTED Final   Candida tropicalis NOT DETECTED NOT DETECTED Final   Cryptococcus neoformans/gattii NOT DETECTED NOT DETECTED Final   Meth resistant mecA/C and MREJ DETECTED (A) NOT DETECTED Final    Comment: CRITICAL RESULT CALLED TO, READ BACK BY AND VERIFIED WITHMelven Sartorius [facility] 12/02/19 0211 JDW Performed at [facility], [address], [place], [place] [address]   Respiratory Panel by RT PCR (Flu A&B, Covid) - Nasopharyngeal Swab     Status: None   Collection Time: 11/30/19 12:53 PM   Specimen: Nasopharyngeal Swab  Result Value Ref Range Status   SARS Coronavirus 2 by RT PCR NEGATIVE NEGATIVE Final    Comment: (NOTE) SARS-CoV-2 target nucleic  acids are NOT DETECTED.  The SARS-CoV-2 RNA is generally detectable in upper respiratoy specimens during the acute phase of infection. The lowest concentration of SARS-CoV-2 viral copies this assay can detect is 131 copies/mL. A negative result does not preclude SARS-Cov-2 infection and should not be used as the sole basis for treatment or other patient management decisions. A negative result may occur with  improper specimen collection/handling, submission of specimen other than nasopharyngeal swab, presence of viral mutation(s) within the areas targeted by this assay, and inadequate number of viral copies (<131 copies/mL). A negative result must be combined with clinical observations, patient history, and epidemiological information. The expected result is Negative.  Fact Sheet for Patients:  https://www.moore.com/  Fact Sheet for Healthcare Providers:  https://www.young.biz/  This test is no t yet approved or cleared by the [place] FDA and  has been authorized for detection and/or diagnosis of SARS-CoV-2 by FDA under an Emergency Use Authorization (EUA). This EUA will remain  in effect (meaning this test can be used) for the duration of the COVID-19 declaration under Section 564(b)(1) of the Act, 21 U.S.C. section 360bbb-3(b)(1), unless the authorization is terminated or revoked sooner.     Influenza A by PCR NEGATIVE NEGATIVE Final   Influenza B by PCR NEGATIVE NEGATIVE Final    Comment: (NOTE) The Xpert Xpress SARS-CoV-2/FLU/RSV assay is intended as an aid in  the diagnosis of influenza from Nasopharyngeal swab specimens and  should not be used as a sole basis for treatment. Nasal washings and  aspirates are unacceptable for Xpert Xpress SARS-CoV-2/FLU/RSV  testing.  Fact Sheet for Patients: https://www.moore.com/  Fact Sheet for Healthcare Providers: https://www.young.biz/  This test  is not yet approved or cleared by the [place] FDA and  has been authorized for detection and/or diagnosis of SARS-CoV-2 by  FDA under an Emergency Use Authorization (EUA). This EUA will remain  in effect (meaning this test can be used) for the duration of the  Covid-19 declaration under Section 564(b)(1) of the Act, 21  U.S.C. section 360bbb-3(b)(1), unless the authorization is  terminated or revoked. Performed at [facility], [address] [place], [place] [address]   MRSA PCR Screening     Status: Abnormal   Collection Time: 11/30/19  4:58 PM   Specimen: Nasopharyngeal  Result Value Ref Range Status   MRSA by  PCR POSITIVE (A) NEGATIVE Final    Comment:        The GeneXpert MRSA Assay (FDA approved for NASAL specimens only), is one component of a comprehensive MRSA colonization surveillance program. It is not intended to diagnose MRSA infection nor to guide or monitor treatment for MRSA infections. RESULT CALLED TO, READ BACK BY AND VERIFIED WITH: Brooke DareJ JONES RN 11/30/19 AT 2231 SK Performed at [facility], [address] [place], [place] [address]   Blood Culture (routine x 2)     Status: Abnormal   Collection Time: 11/30/19  5:46 PM   Specimen: BLOOD RIGHT HAND  Result Value Ref Range Status   Specimen Description BLOOD RIGHT HAND  Final   Special Requests   Final    BOTTLES DRAWN AEROBIC AND ANAEROBIC Blood Culture results may not be optimal due to an inadequate volume of blood received in culture bottles   Culture  Setup Time   Final    GRAM POSITIVE COCCI IN BOTH AEROBIC AND ANAEROBIC BOTTLES CRITICAL VALUE NOTED.  VALUE IS CONSISTENT WITH PREVIOUSLY REPORTED AND CALLED VALUE.    Culture (A)  Final    STAPHYLOCOCCUS AUREUS SUSCEPTIBILITIES PERFORMED ON PREVIOUS CULTURE WITHIN THE LAST 5 DAYS. Performed at [facility], [address] [place], [place] [address]    Report Status 12/04/2019 FINAL  Final  Aerobic/Anaerobic Culture  (surgical/deep wound)     Status: None (Preliminary result)   Collection Time: 12/05/19 10:02 AM   Specimen: PATH Soft tissue  Result Value Ref Range Status   Specimen Description VALVE  Final   Special Requests A TRICUSPID VALVE VEGETATION  Final   Gram Stain   Final    ABUNDANT WBC PRESENT, PREDOMINANTLY PMN ABUNDANT GRAM POSITIVE COCCI    Culture   Final    ABUNDANT STAPHYLOCOCCUS AUREUS CULTURE REINCUBATED FOR BETTER GROWTH SUSCEPTIBILITIES TO FOLLOW Performed at [facility], [address] [place], [place] [address]    Report Status PENDING  Incomplete  Acid Fast Smear (AFB)     Status: None   Collection Time: 12/05/19 10:02 AM   Specimen: PATH Soft tissue  Result Value Ref Range Status   AFB Specimen Processing Comment  Final    Comment: Tissue Grinding and Digestion/Decontamination   Acid Fast Smear Negative  Final    Comment: (NOTE) Performed At: [facility] [address] [place], [place] [id number] Jolene SchimkeNagendra Sanjai MD OZ:[id number]:[phone]    Source (AFB) VALVE  Final    Comment: Performed at [facility], [address] [place], [place] [address]  Culture, blood (routine x 2)     Status: None (Preliminary result)   Collection Time: 12/06/19  5:52 PM   Specimen: BLOOD  Result Value Ref Range Status   Specimen Description BLOOD SITE NOT SPECIFIED  Final   Special Requests   Final    BOTTLES DRAWN AEROBIC AND ANAEROBIC Blood Culture adequate volume   Culture   Final    NO GROWTH < 24 HOURS Performed at [facility], [address] [place], [place] [address]    Report Status PENDING  Incomplete     Rutha Bouchardrung Jax Kentner, MD [facility]  12/07/2019  9:52 AM

## 2019-12-07 NOTE — Progress Notes (Signed)
     [address]       [place] [address]             [phone]       Stitches removed. Significant weeping from groin incision.  Likely related to anasarca. If continues despite diuresis will consider obtaining a groin ultrasound to evaluate for seroma.  Matie Dimaano [name]

## 2019-12-08 ENCOUNTER — Inpatient Hospital Stay (HOSPITAL_COMMUNITY): Payer: Self-pay

## 2019-12-08 DIAGNOSIS — R509 Fever, unspecified: Secondary | ICD-10-CM

## 2019-12-08 LAB — BPAM RBC
Blood Product Expiration Date: 202112152359
Blood Product Expiration Date: 202112202359
Blood Product Expiration Date: 202112202359
Blood Product Expiration Date: 202112212359
ISSUE DATE / TIME: 202111181847
ISSUE DATE / TIME: 202111220711
ISSUE DATE / TIME: 202111220711
Unit Type and Rh: 6200
Unit Type and Rh: 6200
Unit Type and Rh: 6200
Unit Type and Rh: 6200

## 2019-12-08 LAB — BASIC METABOLIC PANEL
Anion gap: 14 (ref 5–15)
BUN: 18 mg/dL (ref 6–20)
CO2: 23 mmol/L (ref 22–32)
Calcium: 7.9 mg/dL — ABNORMAL LOW (ref 8.9–10.3)
Chloride: 101 mmol/L (ref 98–111)
Creatinine, Ser: 0.57 mg/dL (ref 0.44–1.00)
GFR, Estimated: >60 mL/min (ref >60)
Glucose, Bld: 119 mg/dL — ABNORMAL HIGH (ref 70–99)
Potassium: 2.3 mmol/L — CL (ref 3.5–5.1)
Sodium: 138 mmol/L (ref 135–145)

## 2019-12-08 LAB — TYPE AND SCREEN
ABO/RH(D): A POS
Antibody Screen: NEGATIVE
Unit division: 0
Unit division: 0
Unit division: 0
Unit division: 0

## 2019-12-08 LAB — GLUCOSE, CAPILLARY
Glucose-Capillary: 113 mg/dL — ABNORMAL HIGH (ref 70–99)
Glucose-Capillary: 50 mg/dL — ABNORMAL LOW (ref 70–99)
Glucose-Capillary: 91 mg/dL (ref 70–99)
Glucose-Capillary: 131 mg/dL — ABNORMAL HIGH (ref 70–99)
Glucose-Capillary: 73 mg/dL (ref 70–99)
Glucose-Capillary: 95 mg/dL (ref 70–99)
Glucose-Capillary: 61 mg/dL — ABNORMAL LOW (ref 70–99)

## 2019-12-08 LAB — CK: Total CK: 85 U/L (ref 38–234)

## 2019-12-08 LAB — CBC
HCT: 24.1 % — ABNORMAL LOW (ref 36.0–46.0)
Hemoglobin: 7.2 g/dL — ABNORMAL LOW (ref 12.0–15.0)
MCH: 28.9 pg (ref 26.0–34.0)
MCHC: 29.9 g/dL — ABNORMAL LOW (ref 30.0–36.0)
MCV: 96.8 fL (ref 80.0–100.0)
Platelets: 106 10*3/uL — ABNORMAL LOW (ref 150–400)
RBC: 2.49 MIL/uL — ABNORMAL LOW (ref 3.87–5.11)
RDW: 26.9 % — ABNORMAL HIGH (ref 11.5–15.5)
WBC: 13.4 10*3/uL — ABNORMAL HIGH (ref 4.0–10.5)
nRBC: 0.7 % — ABNORMAL HIGH (ref 0.0–0.2)

## 2019-12-08 LAB — MAGNESIUM: Magnesium: 1.5 mg/dL — ABNORMAL LOW (ref 1.7–2.4)

## 2019-12-08 LAB — PHOSPHORUS: Phosphorus: 2 mg/dL — ABNORMAL LOW (ref 2.5–4.6)

## 2019-12-08 IMAGING — MR MR THORACIC SPINE WO/W CM
5 of 9 series · 24 of 48 positions shown · IV contrast (gadavist)
Comparison: [DATE]

CLINICAL DATA: IV drug abuse.  Staph infection.  Back pain.

EXAM:
MRI THORACIC WITHOUT AND WITH CONTRAST
TECHNIQUE: Multiplanar and multiecho pulse sequences of the thoracic spine were
obtained without and with intravenous contrast.
CONTRAST:  5mL GADAVIST GADOBUTROL 1 MMOL/ML IV SOLN

[Series 22: T1 · sagittal · 6.0mm · 1.23mm/px · 1 of 9 slices shown (1 of 3)]
[im 1/9]
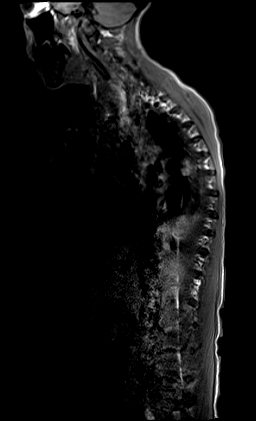

[Series 23: T2 · sagittal · 3.0mm · 0.76mm/px · 3 of 17 slices shown (1 of 2)]
[im 1/17]
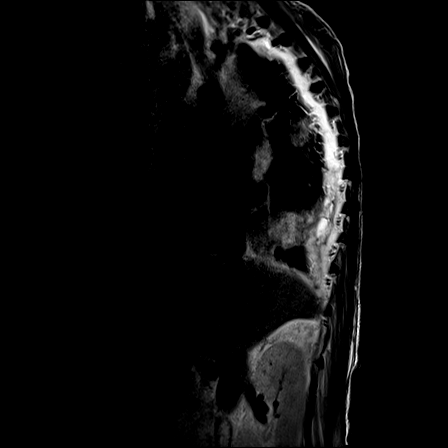
[im 9/17]
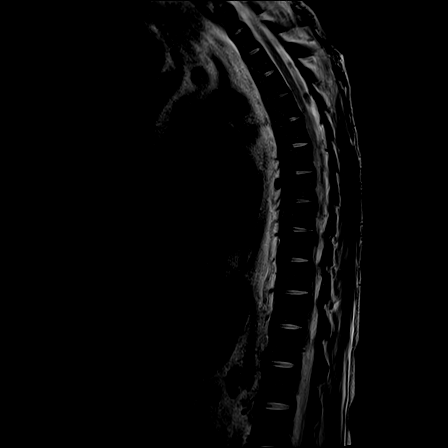
[im 17/17]
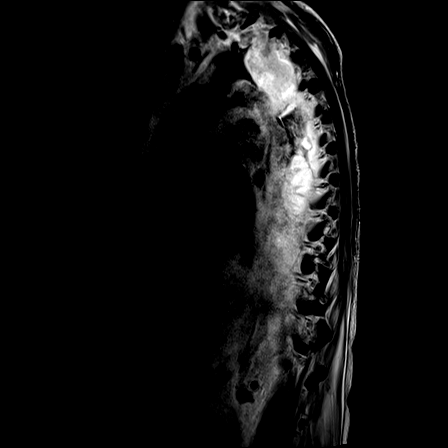

[Series 25: T1 · sagittal · 3.0mm · 0.76mm/px · 4 of 17 slices shown (2 of 3)]
[im 1/17]
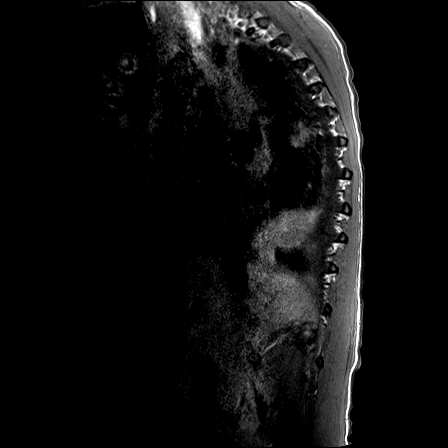
[im 6/17]
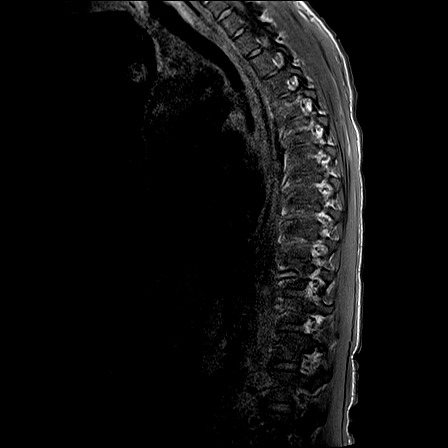
[im 11/17]
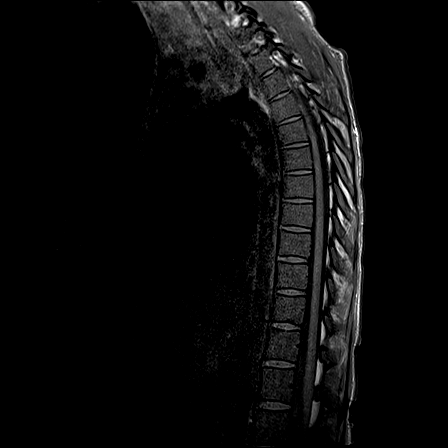
[im 17/17]
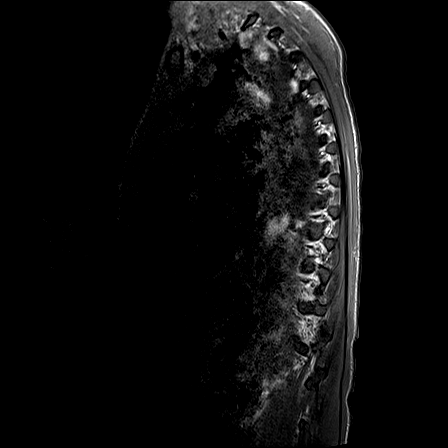

[Series 26: T2 · axial · 5.0mm · 0.59mm/px · z∈[-352,-104]mm · 8 of 39 slices shown (2 of 2)]
[im 1/39]
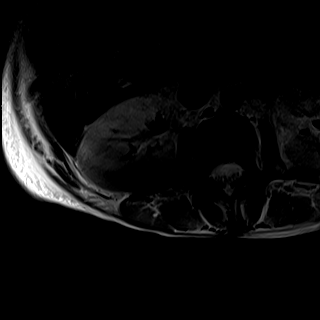
[im 6/39]
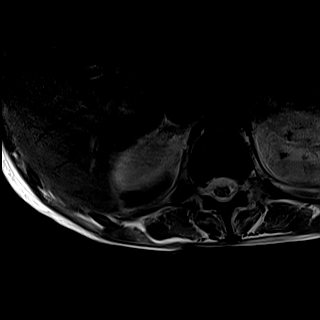
[im 11/39]
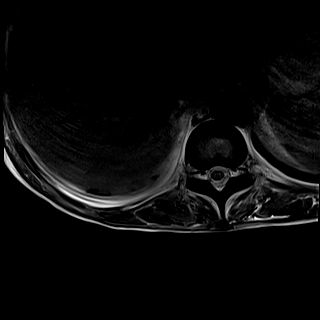
[im 17/39]
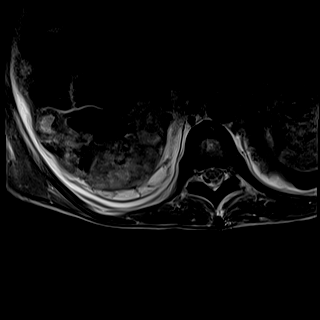
[im 22/39]
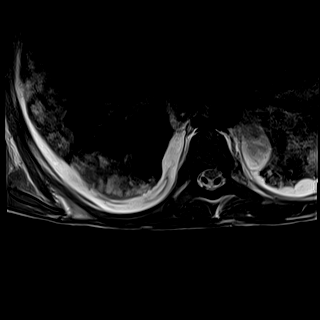
[im 28/39]
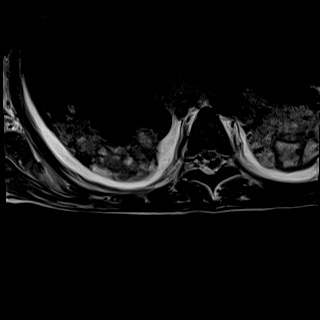
[im 33/39]
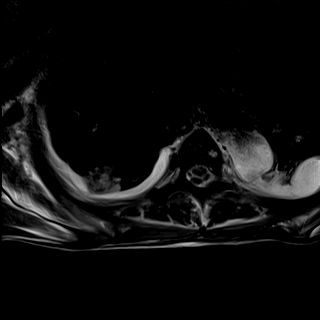
[im 39/39]
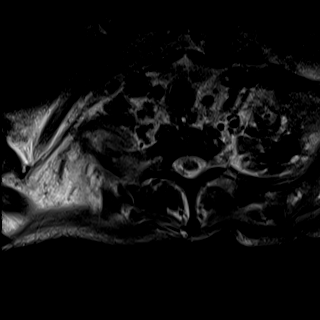

[Series 28: T1 · axial · non-contrast · 5.0mm · 0.31mm/px · z∈[-352,-104]mm · 8 of 39 slices shown (3 of 3)]
[im 1/39]
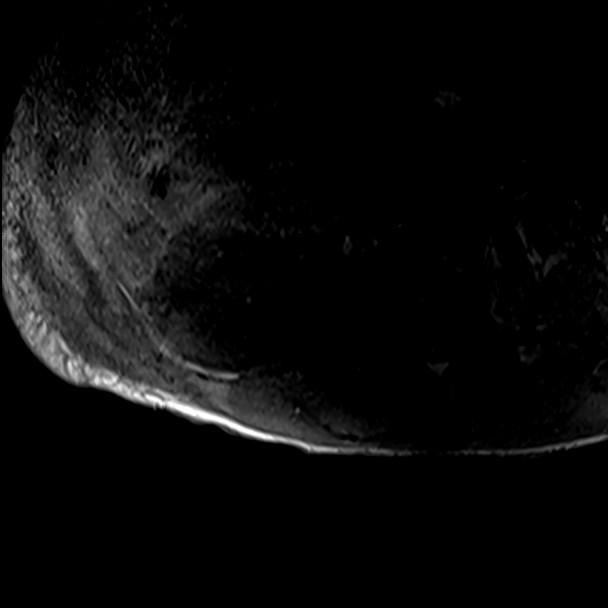
[im 6/39]
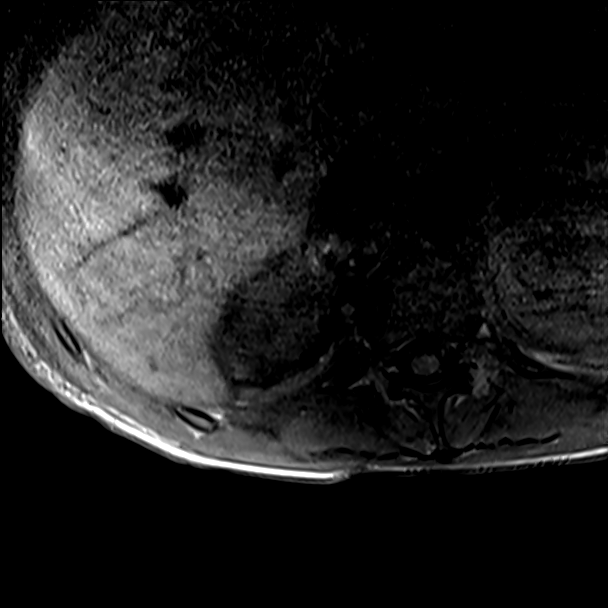
[im 11/39]
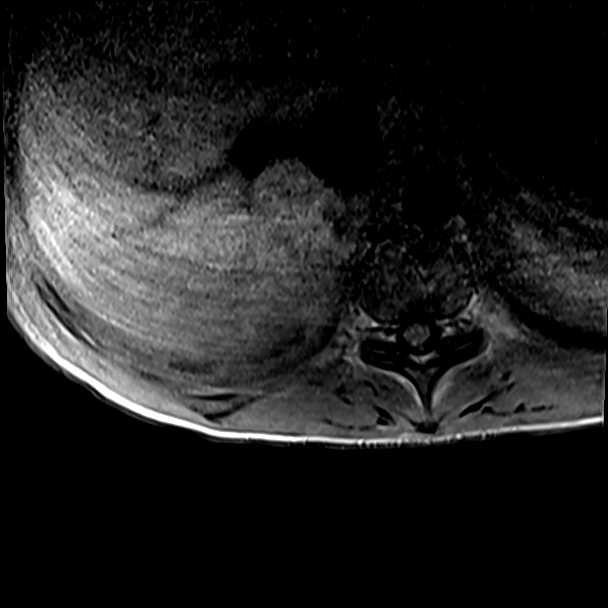
[im 17/39]
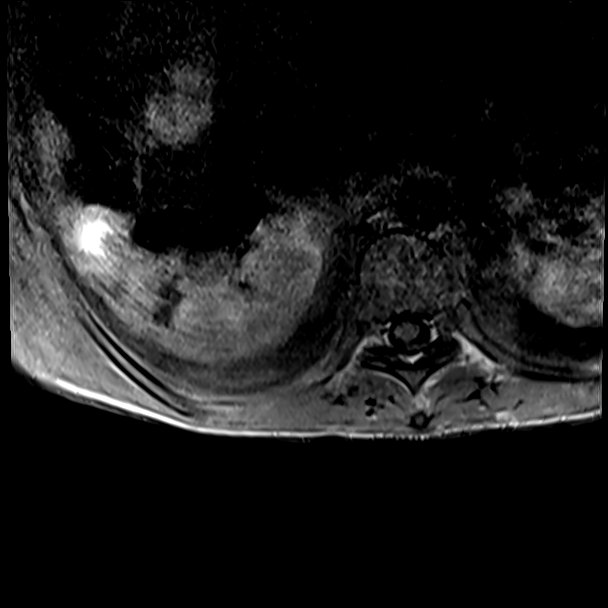
[im 22/39]
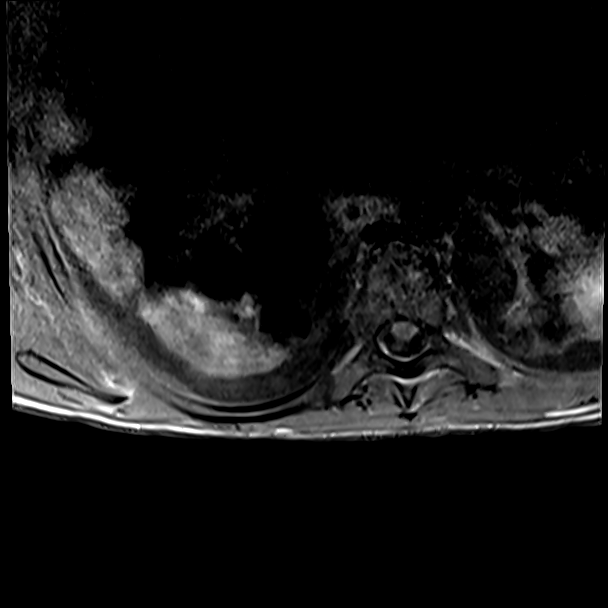
[im 28/39]
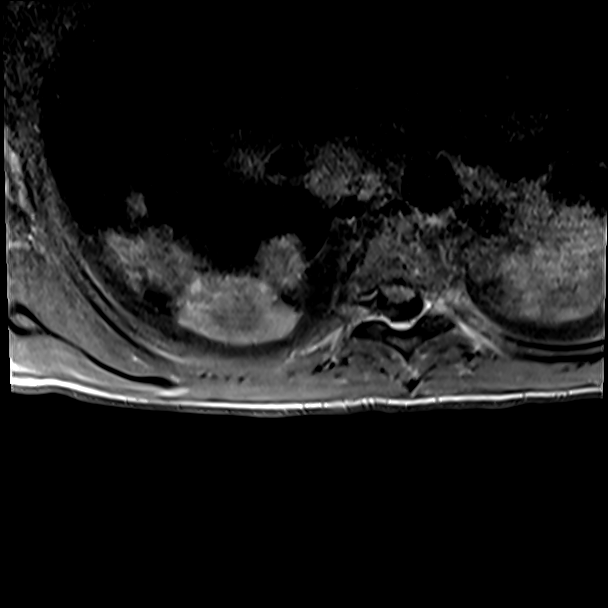
[im 33/39]
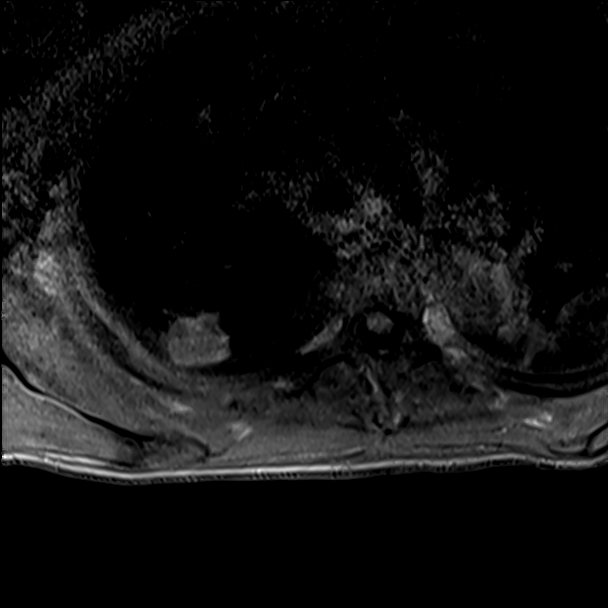
[im 39/39]
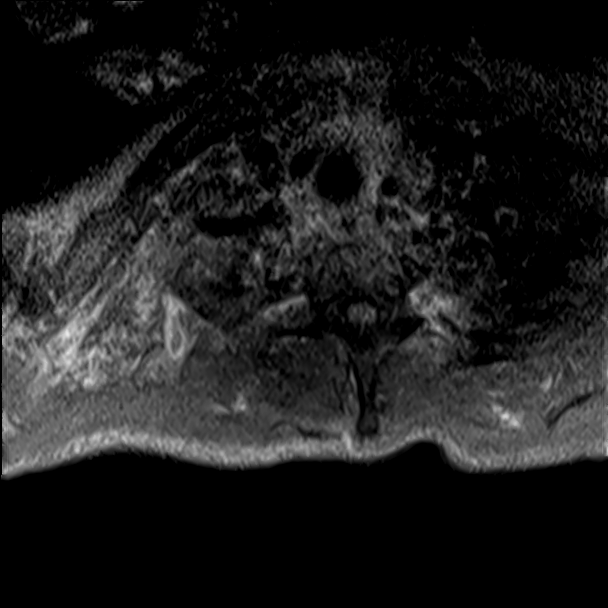

[24 of 48 positions shown; findings below may reference images not displayed]

FINDINGS: MRI THORACIC SPINE FINDINGS

Alignment:  Normal

Vertebrae: Normal

Cord:  Normal

Paraspinal and other soft tissues: Bilateral pleural effusions.
Bilateral lung disease with infiltrate and areas of cavitation.

Disc levels:

No evidence of disc degeneration or disc space infection. Wide
patency of the canal and foramina. No evidence of osteomyelitis or
septic facet arthritis.
IMPRESSION: 1. Normal MRI of the thoracic spine. No evidence of spinal
infection.
2. Bilateral pleural effusions. Bilateral lung disease with
infiltrate and areas of cavitation.

## 2019-12-08 IMAGING — MR MR CERVICAL SPINE WO/W CM
6 of 8 series · 32 of 48 positions shown · IV contrast (gadavist)
Comparison: None.

CLINICAL DATA: Intravenous drug abuse. Staph infection. Bacteremia.
Back pain.

EXAM:
MRI CERVICAL SPINE WITHOUT AND WITH CONTRAST
TECHNIQUE: Multiplanar and multiecho pulse sequences of the cervical spine, to
include the craniocervical junction and cervicothoracic junction,
were obtained without and with intravenous contrast.
CONTRAST:  5mL GADAVIST GADOBUTROL 1 MMOL/ML IV SOLN

[Series 1: T2 · sagittal · 3.0mm · 0.69mm/px · 4 of 15 slices shown (1 of 2)]
[im 1/15]
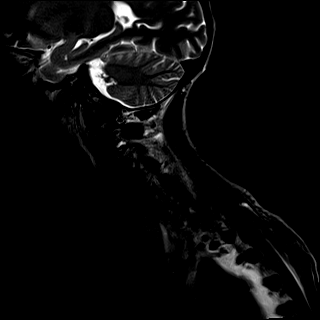
[im 5/15]
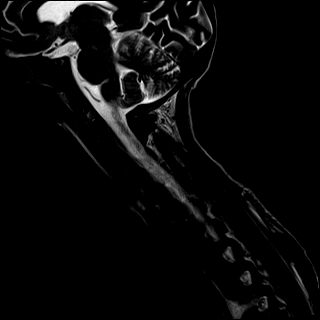
[im 10/15]
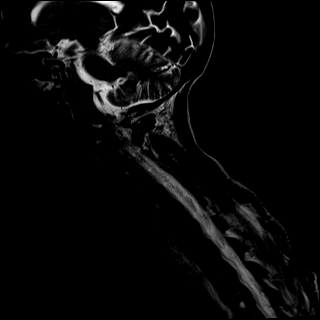
[im 15/15]
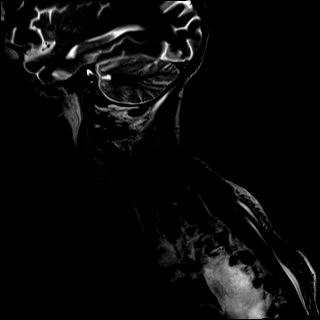

[Series 2: T1 · sagittal · 3.0mm · 0.69mm/px · 4 of 15 slices shown (1 of 2)]
[im 1/15]
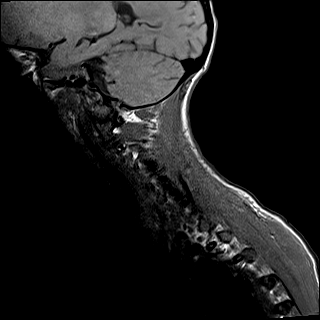
[im 5/15]
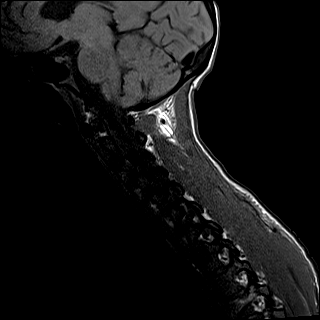
[im 10/15]
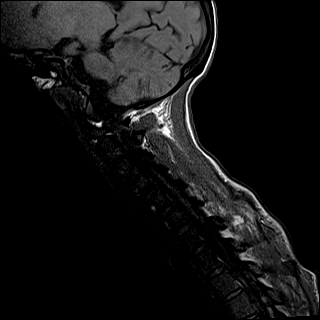
[im 15/15]
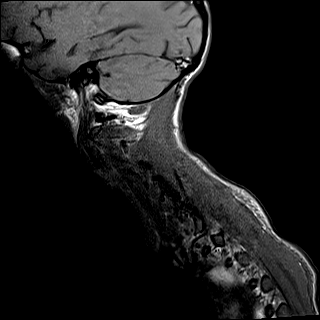

[Series 3: STIR · sagittal · 3.0mm · 0.86mm/px · 4 of 15 slices shown]
[im 1/15]
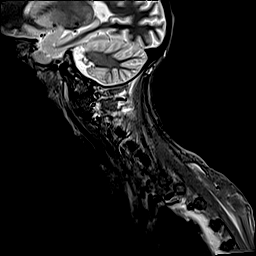
[im 5/15]
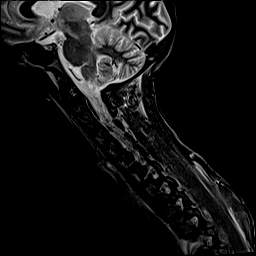
[im 10/15]
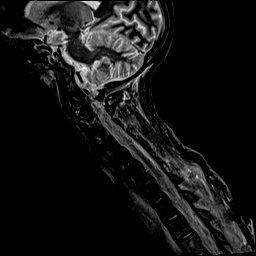
[im 15/15]
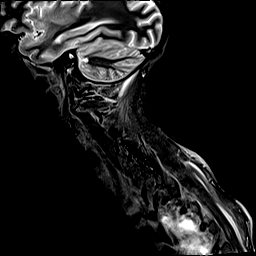

[Series 4: T2 · axial · 3.0mm · 0.66mm/px · z∈[-146,-54]mm · 8 of 35 slices shown (2 of 2)]
[im 1/35]
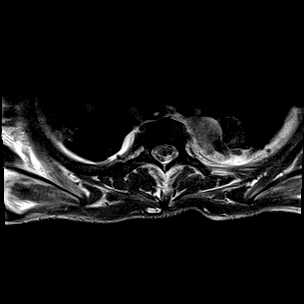
[im 5/35]
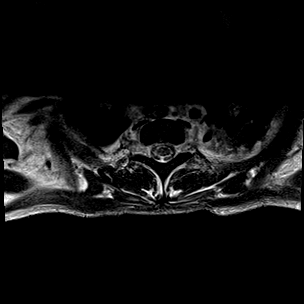
[im 10/35]
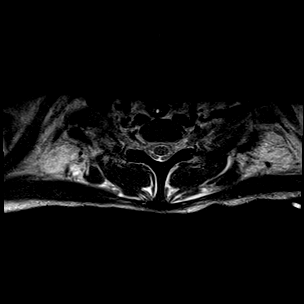
[im 15/35]
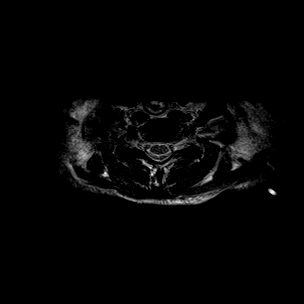
[im 20/35]
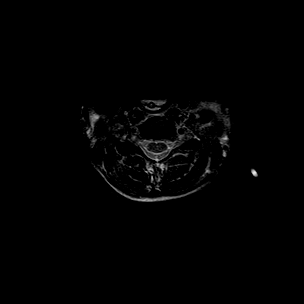
[im 25/35]
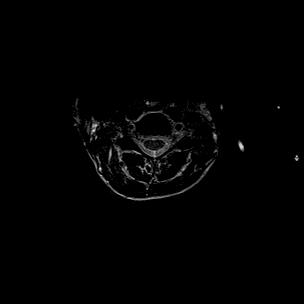
[im 30/35]
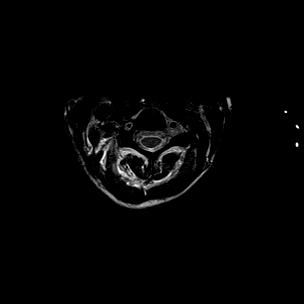
[im 35/35]
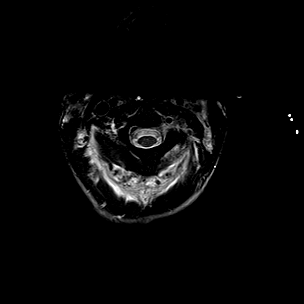

[Series 6: T1 · axial · 3.0mm · 0.35mm/px · z∈[-140,-49]mm · 8 of 35 slices shown (2 of 2)]
[im 1/35]
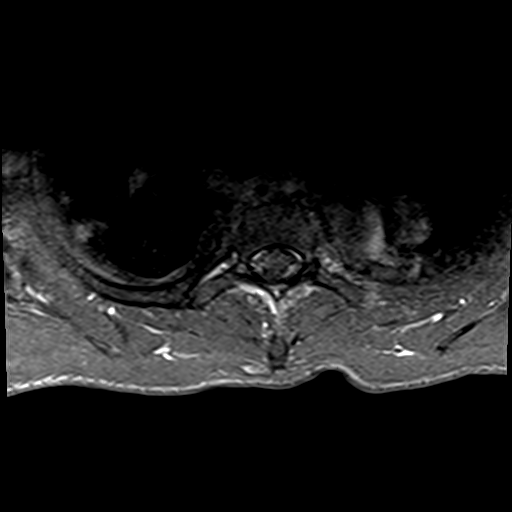
[im 5/35]
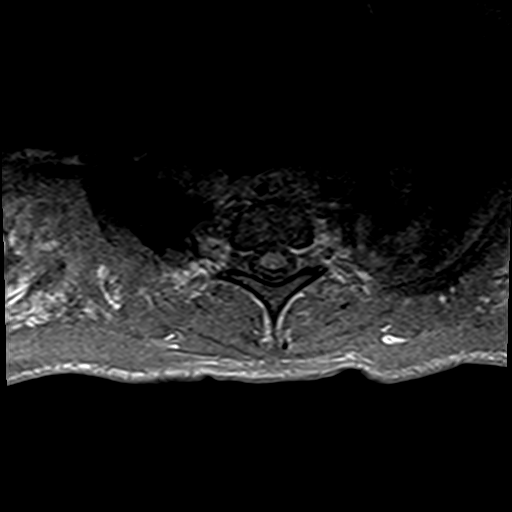
[im 10/35]
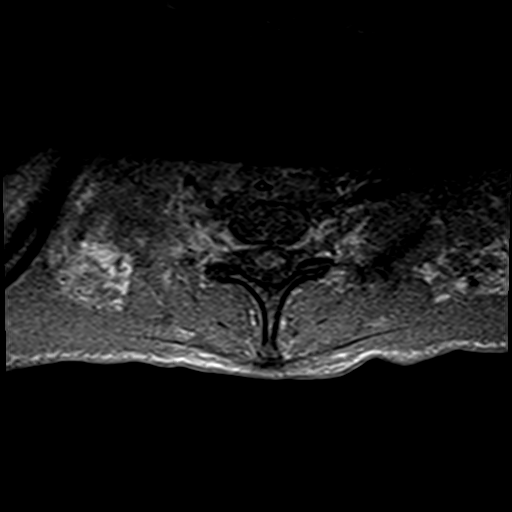
[im 15/35]
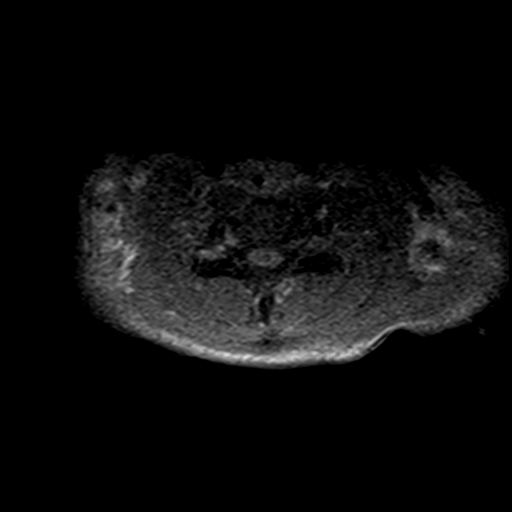
[im 20/35]
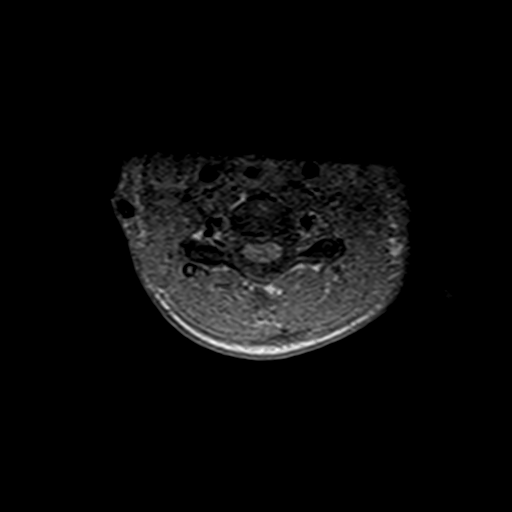
[im 25/35]
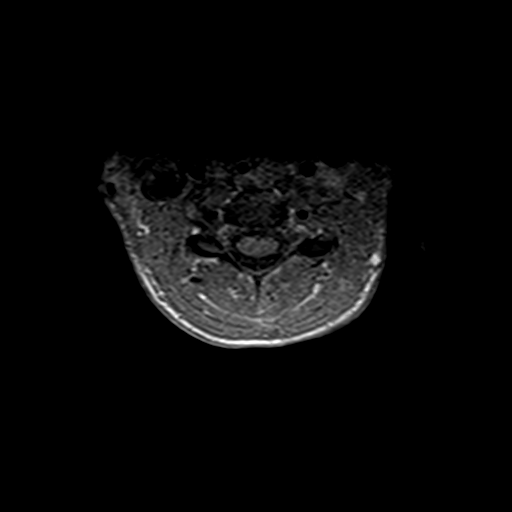
[im 30/35]
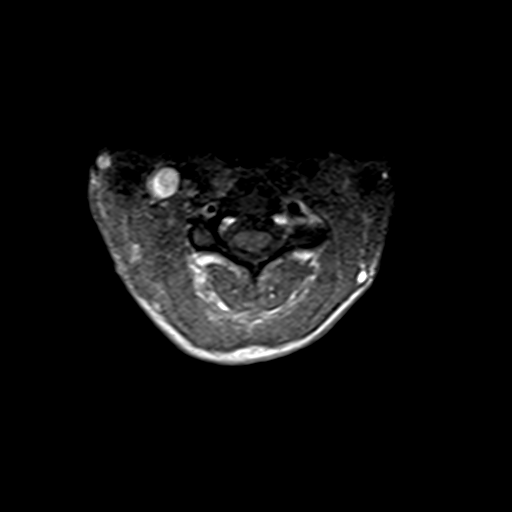
[im 35/35]
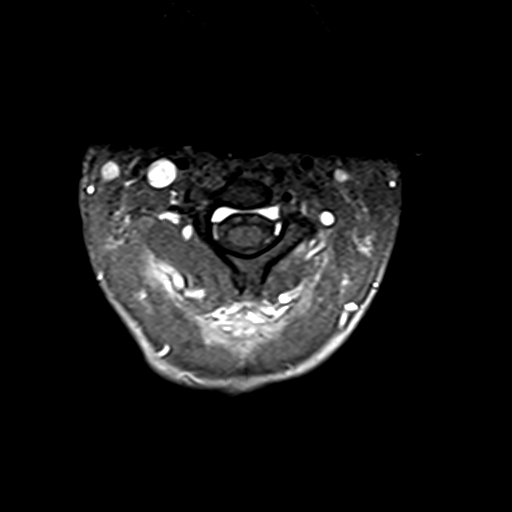

[Series 7: T1 post-contrast · sagittal · 3.0mm · 0.43mm/px · 4 of 15 slices shown]
[im 1/15]
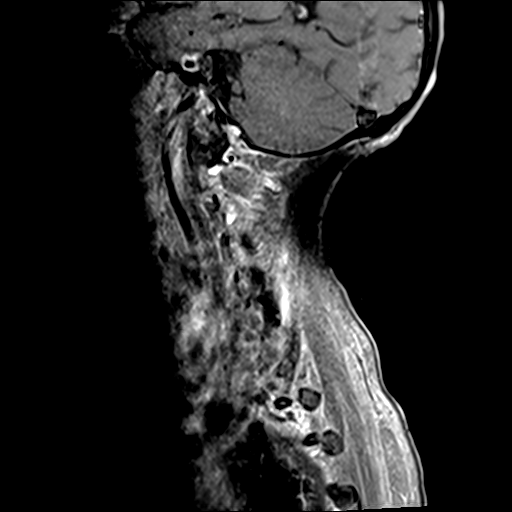
[im 5/15]
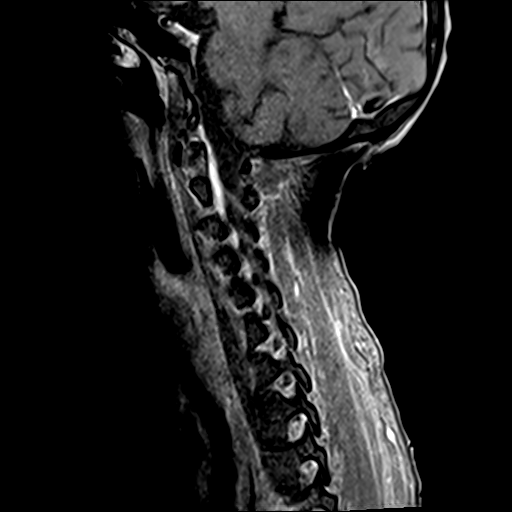
[im 10/15]
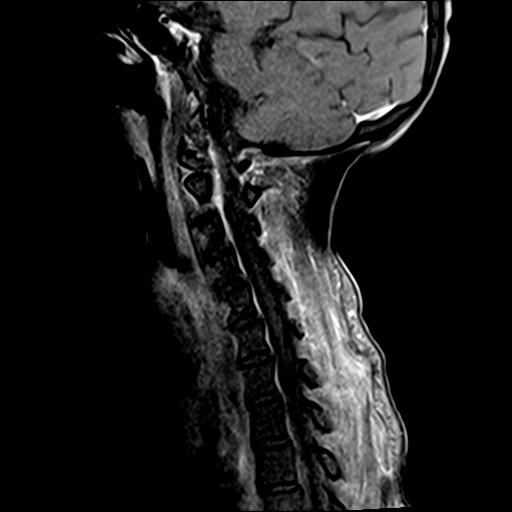
[im 15/15]
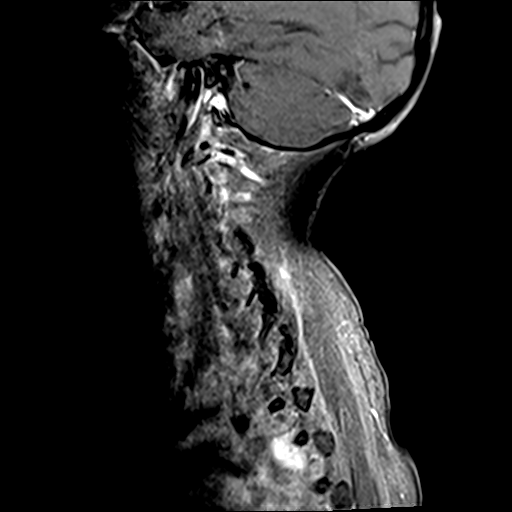

[32 of 48 positions shown; findings below may reference images not displayed]

FINDINGS: Alignment: Normal

Vertebrae: Normal. No sign of osteomyelitis, discitis or septic
facet arthritis.

Cord: Normal

Posterior Fossa, vertebral arteries, paraspinal tissues: Normal

Disc levels:

Normal. No disc degeneration. No stenosis of the canal or foramina.
IMPRESSION: Normal cervical spine MRI. No evidence of spinal infection.

## 2019-12-08 IMAGING — MR MR LUMBAR SPINE WO/W CM
4 of 8 series · 25 of 48 positions shown · IV contrast (gadavist)
Comparison: [DATE]

CLINICAL DATA: IV drug abuse.  Staph infection.  Back pain.

EXAM:
MRI LUMBAR SPINE WITHOUT AND WITH CONTRAST
TECHNIQUE: Multiplanar and multiecho pulse sequences of the lumbar spine were
obtained without and with intravenous contrast.
CONTRAST:  5mL GADAVIST GADOBUTROL 1 MMOL/ML IV SOLN

[Series 10: T2 · sagittal · 4.0mm · 0.73mm/px · 5 of 13 slices shown (1 of 2)]
[im 1/13]
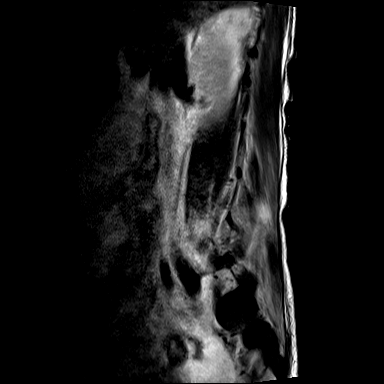
[im 4/13]
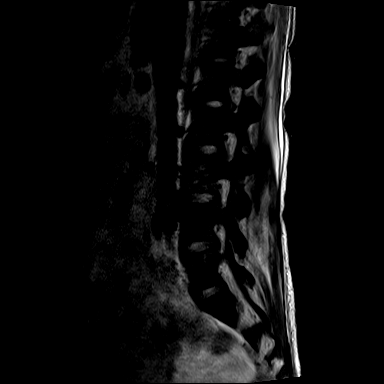
[im 7/13]
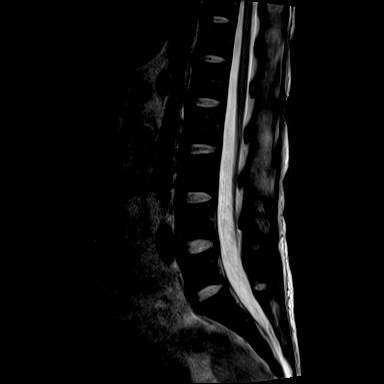
[im 10/13]
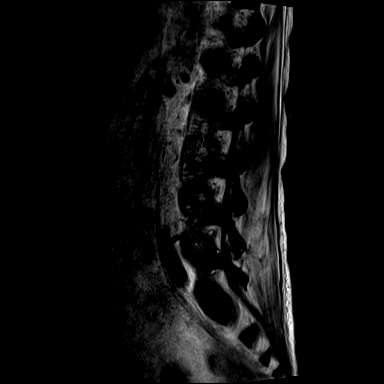
[im 13/13]
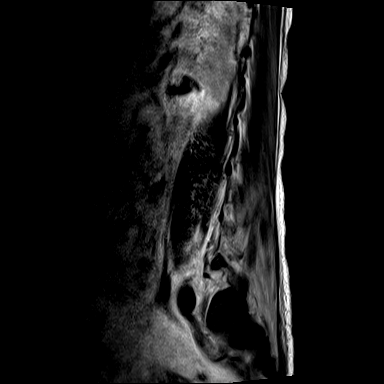

[Series 12: T1 · sagittal · 4.0mm · 0.88mm/px · 4 of 13 slices shown (1 of 2)]
[im 1/13]
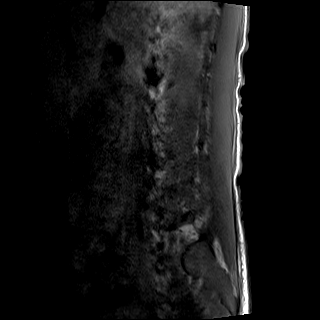
[im 5/13]
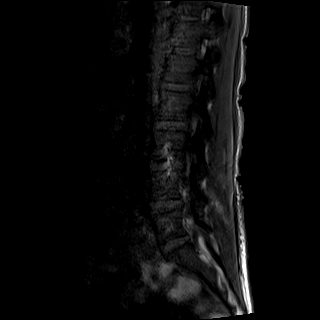
[im 9/13]
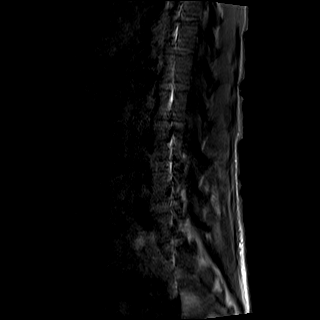
[im 13/13]
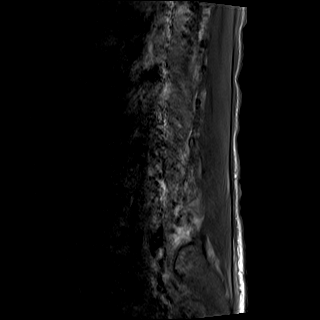

[Series 13: T2 · axial · 5.0mm · 0.57mm/px · z∈[-521,-309]mm · 9 of 25 slices shown (2 of 2)]
[im 1/25]
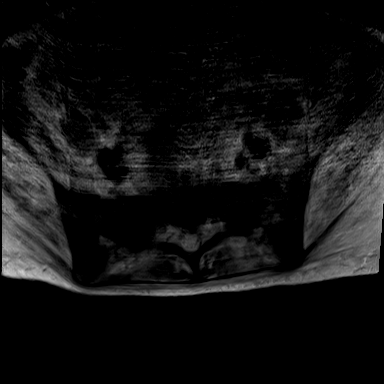
[im 4/25]
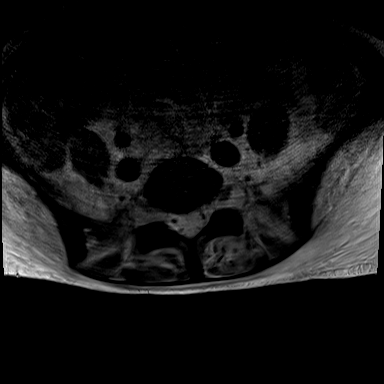
[im 7/25]
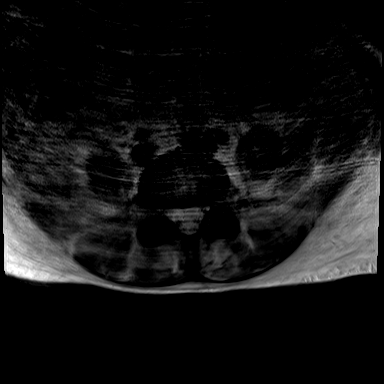
[im 10/25]
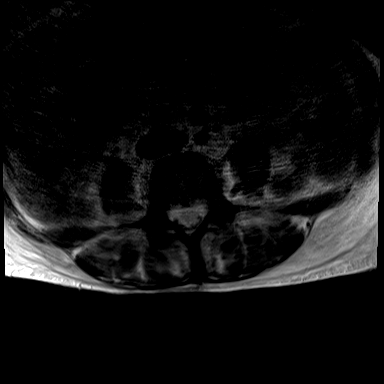
[im 13/25]
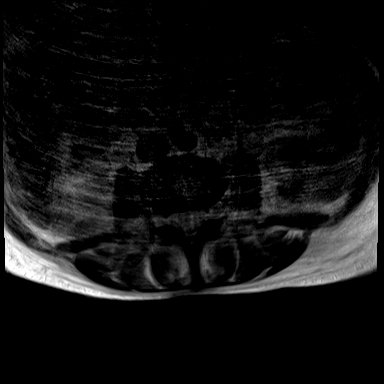
[im 16/25]
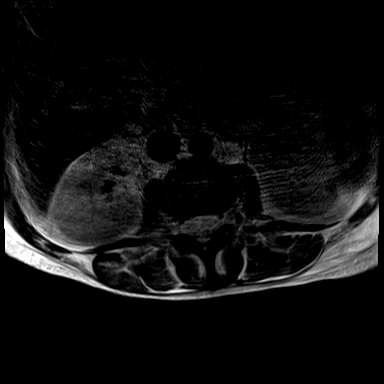
[im 19/25]
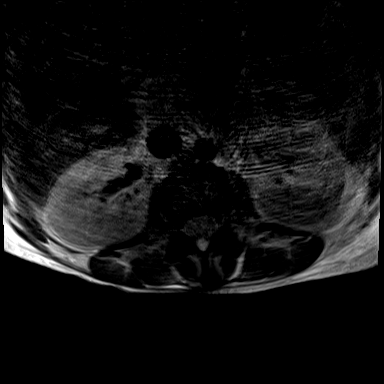
[im 22/25]
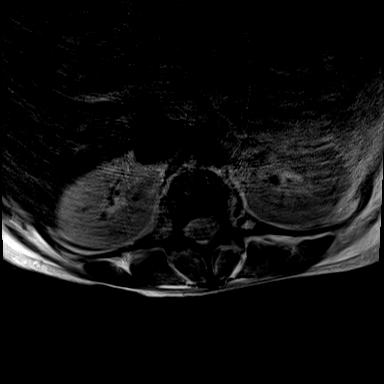
[im 25/25]
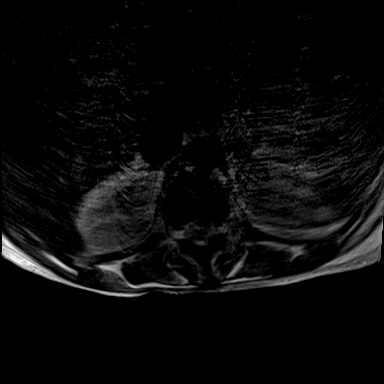

[Series 14: T1 · axial · 5.0mm · 0.34mm/px · z∈[-521,-331]mm · 7 of 25 slices shown (2 of 2)]
[im 1/25]
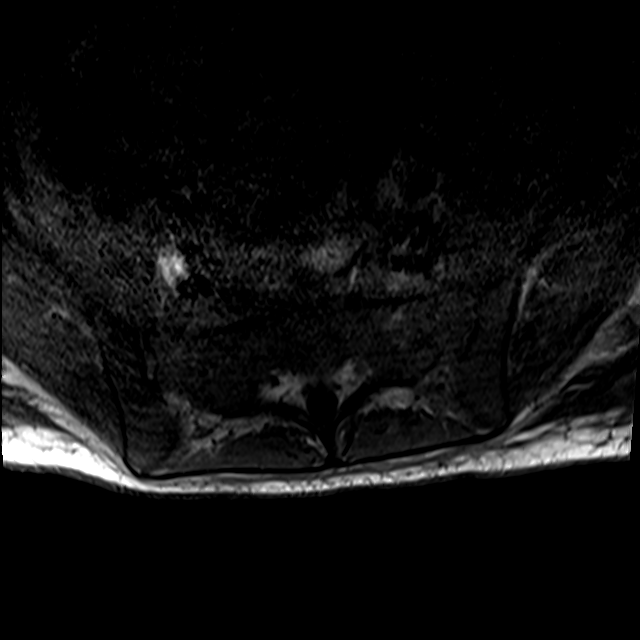
[im 4/25]
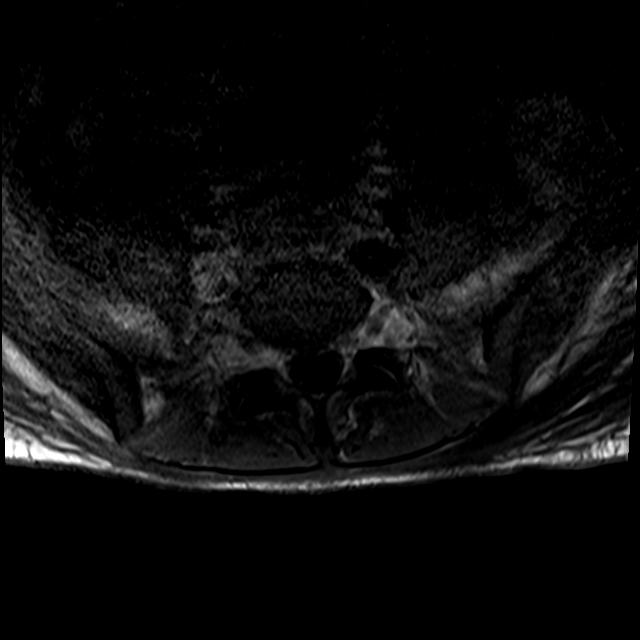
[im 7/25]
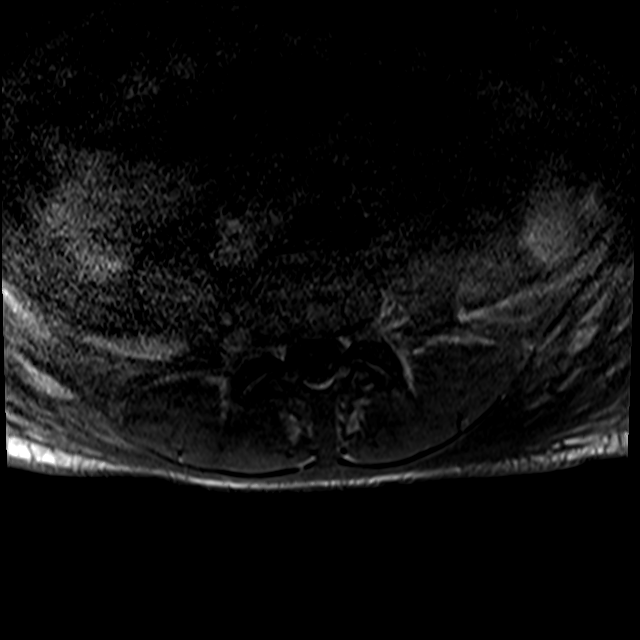
[im 10/25]
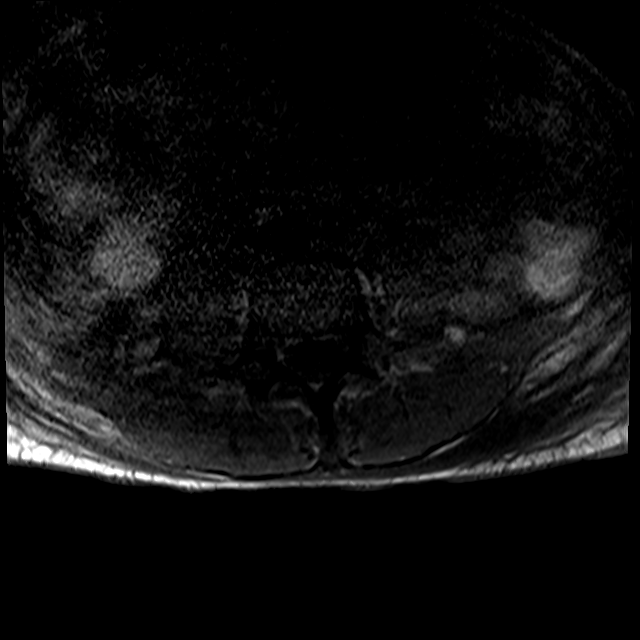
[im 13/25]
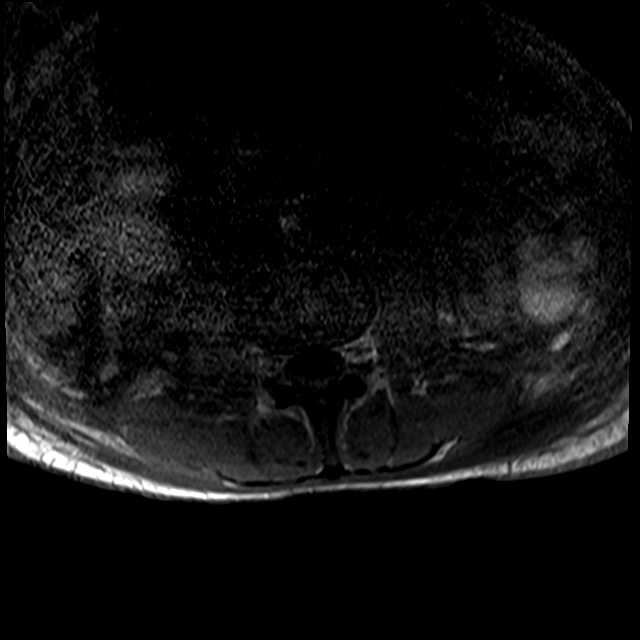
[im 16/25]
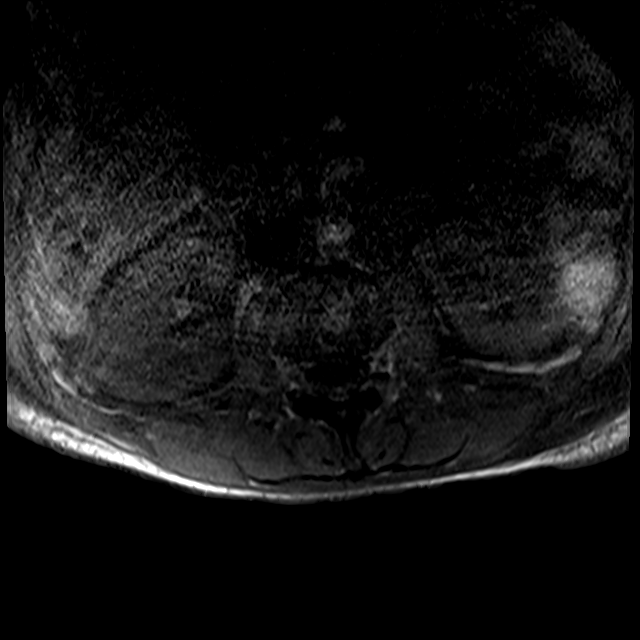
[im 22/25]
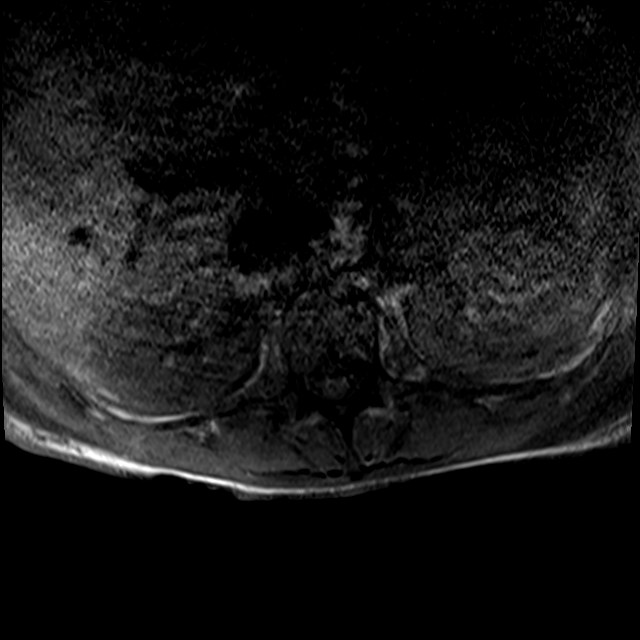

[25 of 48 positions shown; findings below may reference images not displayed]

FINDINGS: Segmentation:  5 lumbar type vertebral bodies.

Alignment:  Normal

Vertebrae:  Vertebral bodies are normal.

Conus medullaris and cauda equina: Conus extends to the L1-2 level.
Conus and cauda equina appear normal.

Paraspinal and other soft tissues: Negative

Disc levels:

No evidence of degenerative disc disease or disc space infection.
Minimal arthritic change at the L4-5 level, without any evidence of
progression or pronounced finding to lend any significant suspicion
of septic facet arthritis.
IMPRESSION: No evidence of spinal infection. Minimal arthritic change at the
L4-5 level. Lack of any progressive change since [REDACTED] argues
against infection.

## 2019-12-08 MED ORDER — GADOBUTROL 1 MMOL/ML IV SOLN
5.0000 mL | Freq: Once | INTRAVENOUS | Status: AC | PRN
  Administered 2019-12-08: 5 mL via INTRAVENOUS

## 2019-12-08 MED ORDER — POTASSIUM & SODIUM PHOSPHATES 280-160-250 MG PO PACK
1.0000 | PACK | Freq: Three times a day (TID) | ORAL | Status: AC
  Administered 2019-12-08 (×3): 1
  Filled 2019-12-08 (×3): qty 1

## 2019-12-08 MED ORDER — METHADONE HCL 10 MG PO TABS
20.0000 mg | ORAL_TABLET | Freq: Three times a day (TID) | ORAL | Status: DC
  Administered 2019-12-08 – 2020-01-02 (×74): 20 mg via ORAL
  Filled 2019-12-08 (×15): qty 2
  Filled 2019-12-08: qty 4
  Filled 2019-12-08 (×9): qty 2
  Filled 2019-12-08: qty 4
  Filled 2019-12-08 (×4): qty 2
  Filled 2019-12-08: qty 4
  Filled 2019-12-08 (×27): qty 2
  Filled 2019-12-08: qty 4
  Filled 2019-12-08 (×15): qty 2

## 2019-12-08 MED ORDER — PHENOL 1.4 % MT LIQD
1.0000 | OROMUCOSAL | Status: DC | PRN
  Administered 2019-12-08 – 2019-12-16 (×2): 1 via OROMUCOSAL
  Filled 2019-12-08 (×2): qty 177

## 2019-12-08 MED ORDER — POLYETHYLENE GLYCOL 3350 17 G PO PACK: 17.0000 g | PACK | Freq: Every day | ORAL | Status: DC | PRN

## 2019-12-08 MED ORDER — MAGNESIUM SULFATE 4 GM/100ML IV SOLN
4.0000 g | Freq: Once | INTRAVENOUS | Status: AC
  Administered 2019-12-08: 4 g via INTRAVENOUS
  Filled 2019-12-08: qty 100

## 2019-12-08 MED ORDER — POTASSIUM CHLORIDE CRYS ER 20 MEQ PO TBCR
40.0000 meq | EXTENDED_RELEASE_TABLET | Freq: Once | ORAL | Status: AC
  Administered 2019-12-08: 40 meq via ORAL
  Filled 2019-12-08: qty 2

## 2019-12-08 MED ORDER — CLONAZEPAM 0.5 MG PO TABS
0.5000 mg | ORAL_TABLET | Freq: Three times a day (TID) | ORAL | Status: DC
  Administered 2019-12-08 – 2019-12-12 (×12): 0.5 mg via ORAL
  Filled 2019-12-08 (×12): qty 1

## 2019-12-08 MED ORDER — K PHOS MONO-SOD PHOS DI & MONO 155-852-130 MG PO TABS
250.0000 mg | ORAL_TABLET | Freq: Three times a day (TID) | ORAL | Status: DC
  Filled 2019-12-08: qty 1

## 2019-12-08 MED ORDER — POTASSIUM CHLORIDE 10 MEQ/100ML IV SOLN
10.0000 meq | INTRAVENOUS | Status: AC
  Administered 2019-12-08 (×4): 10 meq via INTRAVENOUS
  Filled 2019-12-08: qty 100

## 2019-12-08 MED ORDER — POTASSIUM CHLORIDE 10 MEQ/50ML IV SOLN
10.0000 meq | INTRAVENOUS | Status: DC
  Administered 2019-12-08: 10 meq via INTRAVENOUS
  Filled 2019-12-08: qty 50

## 2019-12-08 MED FILL — Electrolyte-R (PH 7.4) Solution: INTRAVENOUS | Qty: 2000 | Status: AC

## 2019-12-08 NOTE — Progress Notes (Addendum)
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  Seen in consultation by cardiology, cardiothoracic surgery and infectious disease.  Not felt to require surgery/also not a candidate given ongoing IVDU.  She signed out AMA.  Has been living with her best friend since. Mom reports ongoing IVDU.  She had to be removed from her vehicle due to lethargy, needles noted on her arm by triage nurse.  She reports progressive weakness and pain in her sides.  Hypotensive in the ED, received 2 L of fluids and empiric Zosyn/vancomycin.  Past Medical History  IVDU  Significant Hospital Events   10/23-26 hospital admit for endocarditis, signed out [facility] 11/22 angiovac 11/23 off Ketamine drip 11/24 extubated Consults:  CT surgery Infectious disease  Procedures:  11/22 angiovac  Significant Diagnostic Tests:  CXR 11/17: Persistent bilateral nodular opacities reflecting septic emboli seen on prior chest CT. Echo 11/17 >> 2.36 cm X 1.24c vegetation on the tricuspid valve; predominantely on the septal and posterior leaflets, with severe regurgitation.   MR lumbar/thoracic spine 10/22 >> no evidence of discitis or osteomyelitis CT angio chest 10/21 >> numerous nodules with some cavitation, consistent with septic emboli CT foot left/CT right ankle >> evidence of cellulitis, no osteomyelitis  Micro Data:  Blood cultures 11/13 >> Blood cultures 11/17 >>MRSA,  MRSA PCR >> postive Antimicrobials:  Zosyn 11/17 >>11/18 Vanco 11/17 >> 11/23 Daptomycin  >>11/23 Interim history/subjective:  No vents. Throat is sore. Having diarrhea. Needing a lot of PRNs for agitation and pain.  Objective   Blood pressure 115/75, pulse (!) 116, temperature 99.9 F (37.7 C), resp. rate (!) 27, height 5\' 2"  (1.575 m), weight 54.1 kg, SpO2 99 %. 2L [place]     Intake/Output Summary (Last 24 hours) at 12/08/2019 0839 Last data filed at 12/08/2019 0800 Gross per 24 hour  Intake 2696.28 ml  Output 3905 ml  Net -1208.72 ml   Filed Weights   12/06/19 0500 12/07/19 0440 12/08/19 0419  Weight: 54.1 kg 54.1 kg 54.1 kg    Examination: Constitutional: no acute distress  Eyes: pupils equal and tracking Ears, nose, mouth, and throat: MM try, cortrak in place Cardiovascular: RRR, ext warm Respiratory: Scattered rhonci, no accessory muscle use Gastrointestinal: soft, +BS Skin: No rashes, normal turgor Neurologic: moves all 4 ext to command Psychiatric: RASS 0, anxious  K low: repleting CK okay WBC and Hgb downtrending   Resolved Hospital Problem list   AKI  Assessment & Plan:  MRSA bacteremia and tricusid valve endocarditis s/p angiovac Septic shock Postoperative shock- improved - Daptomycin per ID - Postop care per TCTS  Postoperative hypercarbic and hypoxemic respiratory failure  Continued hypoxemic respiratory failure due to septic emboli of lungs - IS and mobility as tolerated  Anemia, Thrombocytopenia 2/2 MRSA sepsis - Monitor with usual transfusion thresholds  Metabolic encephalopathy, IVDA, opiod dependence, anxiety - Increase methadone, check AM QTc - PRNs as ordered - Start klonipin - Try to keep off precedex  Protein calorie malnutrition, diarrhea - Low BMI, with poor oral intake even prior  to intubation - Continue TF - Change bowel regimen to PRN  IV infiltration LUE with infiltrated IV with skin breakdown - local wound care  Best practice:  Diet: TF supplement, advance PO as tolerated Pain/Anxiety/Delirium protocol (if  indicated): see above VAP protocol (if indicated): off DVT prophylaxis: Lovenox GI prophylaxis: N/A Glucose control: CBGs Mobility: Bedrest Code Status: Full Family Communication: updated patient Disposition: ICU pending precedex liberation   Patient critically ill due to metabolic encephalopathy Interventions to address this today include sedation titration, reorientation Risk of deterioration without these interventions is high  I personally spent 34 minutes providing critical care not including any separately billable procedures  Myrla Halsted MD [facility] 12/08/2019 8:45 AM Personal pager: [phone] If unanswered, please page CCM On-call: #[phone]

## 2019-12-08 NOTE — Progress Notes (Signed)
eLink Physician-Brief Progress Note Patient Name: Amber [name] DOB: Sep 03, 1990 MRN: [id number]   Date of Service  12/08/2019  HPI/Events of Note  Hypokalemia , getting lasix  eICU Interventions  Start with 60 meq IV Kcl  Check mag now, please let me know if less than 2.0 Would repeat levels after this and decide further     Intervention Category Major Interventions: Electrolyte abnormality - evaluation and management  Kaytlyn Din G Jordana Dugue 12/08/2019, 6:09 AM

## 2019-12-08 NOTE — Progress Notes (Addendum)
Pharmacy Electrolyte Replacement  Recent Labs:  Recent Labs    12/08/19 0423  K 2.3*  MG 1.5*  PHOS 2.0*  CREATININE 0.57    Low Critical Values (K </= 2.5, Phos </= 1, Mg </= 1) Present: K and Mg replaced earlier.   Phos now back at 2 - will replace per protocol  MD Contacted: Dr. Katrinka Blazing  Plan: Phos-NaK 250mg  per tube every 8 hours  x3 doses.

## 2019-12-08 NOTE — Progress Notes (Signed)
eLink Physician-Brief Progress Note Patient Name: CHASLYN EISEN DOB: 01-20-1990 MRN: [id number]   Date of Service  12/08/2019  HPI/Events of Note  Throat discomfort post extubation  eICU Interventions  Chloraseptic spray prn ordered     Intervention Category Minor Interventions: Routine modifications to care plan (e.g. PRN medications for pain, fever)  Rosalie Gums Shaney Deckman 12/08/2019, 12:57 AM

## 2019-12-08 NOTE — Progress Notes (Signed)
      [facility]  Date of Admission:  11/30/2019     Total days of antibiotics: 8         Current antibiotics: Daptomycin  Reason for visit: Follow up on MRSA bacteremia   ASSESSMENT AND PLAN:   Amber [name] is a 29 y.o. 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  S/p angiovac procedure 11/22 and extubated 11/24.  # MRSA Bacteremia complicated by TV IE s/p Angiovac 11/22 -- continue daptomycin -- weekly CK (last done 11/25) -- I am concerned re: metastatic infection (spinal abscess/OM, intra-abdominal, or CNS) given ongoing fevers and prolonged bacteremia -- MRI spine to start given her complaint of neck pain and back pain this morning with difficulty localizing symptoms.  MRI lumbar spine last month noted edema at L4-5 region -- follow up repeat blood cultures from 11/23  Principal Problem:   MRSA infection Active Problems:   Septic shock (HCC)   Opioid use with withdrawal (HCC)   Endocarditis of tricuspid valve   Palliative care encounter   SUBJECTIVE:   Feels "terrible all over".  Complains of pain her her extremities, back, and neck.  Reports fevers.  She is tearful.  Tmax 101.3  Review of Systems: As noted above.  All other systems reviewed and are negative.   OBJECTIVE:   Allergies  Allergen Reactions  . Bee Venom Anaphylaxis    Blood pressure 115/75, pulse (!) 116, temperature 99.9 F (37.7 C), resp. rate (!) 27, height 5\' 2"  (1.575 m), weight 54.1 kg, SpO2 99 %. Body mass index is 21.81 kg/m.  Physical Exam Chronically ill appearing, thin, no distress; alert, able to converse some HEENT: NCAT Neck supple Heart: RRR, systolic murmur Skin: multiple track marks upper extremities Neuro: alert and awake.  She is able to follows commands, moving extremities without obvious assymetry   Lab Results & Microbiology Lab Results  Component Value Date   WBC 13.4  (H) 12/08/2019   HGB 7.2 (L) 12/08/2019   HCT 24.1 (L) 12/08/2019   MCV 96.8 12/08/2019   PLT 106 (L) 12/08/2019    Lab Results  Component Value Date   NA 138 12/08/2019   K 2.3 (LL) 12/08/2019   CO2 23 12/08/2019   GLUCOSE 119 (H) 12/08/2019   BUN 18 12/08/2019   CREATININE 0.57 12/08/2019   CALCIUM 7.9 (L) 12/08/2019   GFRNONAA >60 12/08/2019   GFRAA >60 01/10/2019    Lab Results  Component Value Date   ALT 50 (H) 12/07/2019   AST 112 (H) 12/07/2019   ALKPHOS 120 12/07/2019   BILITOT 1.4 (H) 12/07/2019     I have reviewed the micro and lab results in Epic.  Imaging No results found.     12/09/2019 for Infectious Disease [facility] [phone] pager 12/08/2019, 8:33 AM

## 2019-12-09 LAB — CBC
HCT: 24.3 % — ABNORMAL LOW (ref 36.0–46.0)
Hemoglobin: 7 g/dL — ABNORMAL LOW (ref 12.0–15.0)
MCH: 28.9 pg (ref 26.0–34.0)
MCHC: 28.8 g/dL — ABNORMAL LOW (ref 30.0–36.0)
MCV: 100.4 fL — ABNORMAL HIGH (ref 80.0–100.0)
Platelets: 129 10*3/uL — ABNORMAL LOW (ref 150–400)
RBC: 2.42 MIL/uL — ABNORMAL LOW (ref 3.87–5.11)
RDW: 28.2 % — ABNORMAL HIGH (ref 11.5–15.5)
WBC: 9.7 10*3/uL (ref 4.0–10.5)
nRBC: 0.3 % — ABNORMAL HIGH (ref 0.0–0.2)

## 2019-12-09 LAB — GLUCOSE, CAPILLARY
Glucose-Capillary: 96 mg/dL (ref 70–99)
Glucose-Capillary: 92 mg/dL (ref 70–99)
Glucose-Capillary: 122 mg/dL — ABNORMAL HIGH (ref 70–99)
Glucose-Capillary: 91 mg/dL (ref 70–99)
Glucose-Capillary: 106 mg/dL — ABNORMAL HIGH (ref 70–99)
Glucose-Capillary: 119 mg/dL — ABNORMAL HIGH (ref 70–99)

## 2019-12-09 LAB — BASIC METABOLIC PANEL
Anion gap: 9 (ref 5–15)
BUN: 8 mg/dL (ref 6–20)
CO2: 25 mmol/L (ref 22–32)
Calcium: 7.7 mg/dL — ABNORMAL LOW (ref 8.9–10.3)
Chloride: 102 mmol/L (ref 98–111)
Creatinine, Ser: 0.55 mg/dL (ref 0.44–1.00)
GFR, Estimated: >60 mL/min (ref >60)
Glucose, Bld: 121 mg/dL — ABNORMAL HIGH (ref 70–99)
Potassium: 3.6 mmol/L (ref 3.5–5.1)
Sodium: 136 mmol/L (ref 135–145)

## 2019-12-09 LAB — MAGNESIUM: Magnesium: 1.7 mg/dL (ref 1.7–2.4)

## 2019-12-09 LAB — PHOSPHORUS: Phosphorus: 1.8 mg/dL — ABNORMAL LOW (ref 2.5–4.6)

## 2019-12-09 MED ORDER — DEXTROSE 50 % IV SOLN
INTRAVENOUS | Status: AC
  Filled 2019-12-09: qty 50

## 2019-12-09 MED ORDER — POTASSIUM PHOSPHATES 15 MMOLE/5ML IV SOLN
30.0000 mmol | Freq: Once | INTRAVENOUS | Status: AC
  Administered 2019-12-09: 30 mmol via INTRAVENOUS
  Filled 2019-12-09: qty 10

## 2019-12-09 MED ORDER — DEXTROSE 50 % IV SOLN
12.5000 g | INTRAVENOUS | Status: AC
  Administered 2019-12-09: 12.5 g via INTRAVENOUS

## 2019-12-09 MED ORDER — DOCUSATE SODIUM 100 MG PO CAPS
100.0000 mg | ORAL_CAPSULE | Freq: Two times a day (BID) | ORAL | Status: DC | PRN
  Administered 2020-01-01: 09:00:00 100 mg via ORAL
  Filled 2019-12-09: qty 1

## 2019-12-09 MED ORDER — MAGNESIUM SULFATE 2 GM/50ML IV SOLN
2.0000 g | Freq: Once | INTRAVENOUS | Status: AC
  Administered 2019-12-09: 2 g via INTRAVENOUS
  Filled 2019-12-09: qty 50

## 2019-12-09 NOTE — Progress Notes (Addendum)
Rapid Response Progress Note  Called for red MEWS r/t tachycardia-HR 112, and tachypnea- RR 30. Review of pt's vital signs reveals this is not an acute change. Per RN, assistance from rapid response RN is not needed at this time.   Plan: -Vital signs per MEWS protocol  Please call rapid response when assistance is needed.

## 2019-12-09 NOTE — Progress Notes (Addendum)
12/09/2019  I have seen and evaluated the patient for endocarditis  S:  Patient has pain near central line site. Her withdrawal symptoms seem better.  O: Blood pressure 122/81, pulse (!) 107, temperature 100 F (37.8 C), resp. rate (!) 23, height 5\' 2"  (1.575 m), weight 58.3 kg, SpO2 95 %.  Thin chronically ill woman in NAD 1+ LE edema Globally weak L arm line infiltration site with some skin sloughing Ext warm, heart sounds slightly tachycardic Moves all 4 ext to command  Labs pending MRI C/T/L benign Low grade fevers yesterday WBC trending down  A:  MRSA tricuspid endocarditis s/p debulking Postoperative respiratory failure resolved Postoperative shock resolved Active IVDA and polysubstance abuse PTA Withdraw delirium improved Severe protein calorie malnutrition, muscular deconditioning Various electrolyte deficiencies related to refeeding  P:  -Appreciate continued PT/OT help with getting patient mobilized -Continue to supplement diet with TF, monitor electrolytes and replete PRN, appreciate PharmD help -Abx per ID -Continue methadone at current dosing, check intermittent QTc, consider increasing if needs multiple PRNs -Line holiday then probably need PICC -Stable for progressive, patient knows she needs to stay in hospital if she is going to live through this   12/09/2019 12/11/2019 MD      NAME:  Amber [name], MRN:  Amber [name], DOB:  January 26, 1990, LOS: 9 ADMISSION DATE:  11/30/2019, CONSULTATION DATE:  12/09/2019  REFERRING MD:  12/11/2019, EDP, CHIEF COMPLAINT: Generalized weakness, hypotension  Brief History   29 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  Seen in consultation by cardiology, cardiothoracic surgery and  infectious disease.  Not felt to require surgery/also not a candidate given ongoing IVDU.  She signed out AMA.  Has been living with her best friend since. Mom reports ongoing IVDU.  She had to be removed from her vehicle due to lethargy, needles noted on her arm by triage nurse.  She reports progressive weakness and pain in her sides.  Hypotensive in the ED, received 2 L of fluids and empiric Zosyn/vancomycin.  Past Medical History  IVDU  Significant Hospital Events   10/23-26 hospital admit for endocarditis, signed out [facility] 11/22 angiovac 11/23 off Ketamine drip 11/24 extubated Consults:  CT surgery Infectious disease  Procedures:  11/22 angiovac  Significant Diagnostic Tests:  CXR 11/17: Persistent bilateral nodular opacities reflecting septic emboli seen on prior chest CT. Echo 11/17 >> 2.36 cm X 1.24c vegetation on the tricuspid valve; predominantely on the septal and posterior leaflets, with severe regurgitation.  MR lumbar/thoracic spine 10/22 >> no evidence of discitis or osteomyelitis CT angio chest 10/21 >> numerous nodules with some cavitation, consistent with septic emboli CT foot left/CT right ankle >> evidence of cellulitis, no osteomyelitis 11/25 MR Lumbar, thoracic, cervical spine > no evidence for spinal infection.   Micro Data:  Blood cultures 11/13 >> Blood cultures 11/17 >> MRSA MRSA PCR >> positive Blood 11/23 >>>  Antimicrobials:  Zosyn 11/17 >>11/18 Vanco 11/17 >> 11/23 Daptomycin 11/23 >>>  Interim history/subjective:  Doing better today. Pain better controlled.  Objective   Blood pressure 122/81, pulse (!) 107, temperature 100 F (37.8 C), resp. rate (!) 23, height 5\' 2"  (1.575 m), weight 58.3 kg, SpO2 95 %. 2L [place]     Intake/Output Summary (Last 24 hours) at 12/09/2019 [id number]  Last data filed at 12/09/2019 0500 Gross per 24 hour  Intake 2146.18 ml  Output 1085 ml  Net 1061.18 ml   Filed Weights   12/07/19 0440 12/08/19 0419 12/09/19 0500   Weight: 54.1 kg 54.1 kg 58.3 kg    Examination: Constitutional: young adult female in NAD HEENT: [place]/AT [place], no JVD Cardiovascular: RRR, 1+ pretibial edema.  Respiratory: Scattered coarse crackles, no distress. Gastrointestinal: Soft, non-tender, non-distended Skin: Grossly intact Neurologic: Alert, oriented, non-focal  Resolved Hospital Problem list   AKI Postoperative hypercarbic and hypoxemic respiratory failure   Assessment & Plan:  MRSA bacteremia and tricuspid valve endocarditis s/p angiovac Septic shock Postoperative shock- improved - Daptomycin per ID - Postop care per TCTS - DC central line. Manage with PIV. Will need PICC after line holiday for ongoing IV antibiotics.   Continued hypoxemic respiratory failure due to septic emboli of lungs - IS and mobility as tolerated  Anemia, Thrombocytopenia 2/2 MRSA sepsis - Monitor with usual transfusion thresholds - Labs 11/26 pending.   Metabolic encephalopathy, IVDA, opiod dependence, anxiety - Methadone - (QTC 479) - PRNs as ordered - Off Precedex 24 hours, will DC - Klonipin 0.5 mg TID  Protein calorie malnutrition, diarrhea - Low BMI, with poor oral intake even prior to intubation - Encourage orals - Continue TF - Change bowel regimen to PRN  IV infiltration - LUE with infiltrated IV with skin breakdown - local wound care  Transfer to med tele and TRH  Best practice:  Diet: TF supplement, advancing PO Pain/Anxiety/Delirium protocol (if indicated): see above VAP protocol (if indicated): off DVT prophylaxis: Lovenox GI prophylaxis: N/A Glucose control: CBGs Mobility: Bedrest Code Status: Full Family Communication: patient updated.  Disposition:med tele   Joneen Roach, AGACNP-BC [facility]  See Amion for personal pager PCCM on call pager [phone]  12/09/2019 7:52 AM

## 2019-12-09 NOTE — Progress Notes (Signed)
[facility]  Date of Admission:  11/30/2019     Total days of antibiotics: 9         Current antibiotics: Daptomycin  Reason for visit: Follow up on MRSA bacteremia   ASSESSMENT AND PLAN:   Amber [name] is a 29 y.o. 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  S/p angiovac procedure 11/22 and extubated 11/24.  # MRSA Bacteremia complicated by TV IE s/p Angiovac 11/22 # Fevers -- continue daptomycin -- weekly CK (last done 11/25) -- MRI spine was negative yesterday for metastatic infection, but still having fevers.  Will see about removing left sided CVC as this may be a nidus for her fevers -- follow up repeat blood cultures from 11/23 (NGTD)   SUBJECTIVE:   Still febrile but better overall today.  Back pain better.  Still complains of pain related to ET tube now removed.  No pain in her ankles or knees.  Review of Systems: As noted above.  All other systems reviewed and are negative.   OBJECTIVE:   Allergies  Allergen Reactions   Bee Venom Anaphylaxis    Blood pressure 122/81, pulse (!) 107, temperature 100 F (37.8 C), resp. rate (!) 23, height 5\' 2"  (1.575 m), weight 58.3 kg, SpO2 95 %. Body mass index is 23.51 kg/m.  Physical Exam Chronically ill appearing, thin, no distress; alert, able to converse some HEENT: NCAT Neck supple, Left CVC Heart: RRR, systolic murmur Skin: multiple track marks upper extremities MSK: pre tibial edema.  Able to move knees and ankles without pain Neuro: alert and awake.  She is able to follows commands, moving extremities without obvious assymetry   Lab Results & Microbiology Lab Results  Component Value Date   WBC 13.4 (H) 12/08/2019   HGB 7.2 (L) 12/08/2019   HCT 24.1 (L) 12/08/2019   MCV 96.8 12/08/2019   PLT 106 (L) 12/08/2019    Lab Results  Component Value Date   NA 138 12/08/2019   K 2.3 (LL) 12/08/2019   CO2  23 12/08/2019   GLUCOSE 119 (H) 12/08/2019   BUN 18 12/08/2019   CREATININE 0.57 12/08/2019   CALCIUM 7.9 (L) 12/08/2019   GFRNONAA >60 12/08/2019   GFRAA >60 01/10/2019    Lab Results  Component Value Date   ALT 50 (H) 12/07/2019   AST 112 (H) 12/07/2019   ALKPHOS 120 12/07/2019   BILITOT 1.4 (H) 12/07/2019     I have reviewed the micro and lab results in Epic.  Imaging MR CERVICAL SPINE W WO CONTRAST  Result Date: 12/08/2019 CLINICAL DATA:  Intravenous drug abuse. Staph infection. Bacteremia. Back pain. EXAM: MRI CERVICAL SPINE WITHOUT AND WITH CONTRAST TECHNIQUE: Multiplanar and multiecho pulse sequences of the cervical spine, to include the craniocervical junction and cervicothoracic junction, were obtained without and with intravenous contrast. CONTRAST:  74mL GADAVIST GADOBUTROL 1 MMOL/ML IV SOLN COMPARISON:  None. FINDINGS: Alignment: Normal Vertebrae: Normal. No sign of osteomyelitis, discitis or septic facet arthritis. Cord: Normal Posterior Fossa, vertebral arteries, paraspinal tissues: Normal Disc levels: Normal. No disc degeneration. No stenosis of the canal or foramina. IMPRESSION: Normal cervical spine MRI. No evidence of spinal infection. Electronically Signed   By: 4m M.D.   On: 12/08/2019 15:41   MR THORACIC SPINE W WO CONTRAST  Result Date: 12/08/2019 CLINICAL DATA:  IV drug abuse.  Staph infection.  Back pain. EXAM: MRI THORACIC WITHOUT  AND WITH CONTRAST TECHNIQUE: Multiplanar and multiecho pulse sequences of the thoracic spine were obtained without and with intravenous contrast. CONTRAST:  53mL GADAVIST GADOBUTROL 1 MMOL/ML IV SOLN COMPARISON:  11/04/2019 FINDINGS: MRI THORACIC SPINE FINDINGS Alignment:  Normal Vertebrae: Normal Cord:  Normal Paraspinal and other soft tissues: Bilateral pleural effusions. Bilateral lung disease with infiltrate and areas of cavitation. Disc levels: No evidence of disc degeneration or disc space infection. Wide patency of the  canal and foramina. No evidence of osteomyelitis or septic facet arthritis. IMPRESSION: 1. Normal MRI of the thoracic spine. No evidence of spinal infection. 2. Bilateral pleural effusions. Bilateral lung disease with infiltrate and areas of cavitation. Electronically Signed   By: Paulina Fusi M.D.   On: 12/08/2019 15:50   MR Lumbar Spine W Wo Contrast  Result Date: 12/08/2019 CLINICAL DATA:  IV drug abuse.  Staph infection.  Back pain. EXAM: MRI LUMBAR SPINE WITHOUT AND WITH CONTRAST TECHNIQUE: Multiplanar and multiecho pulse sequences of the lumbar spine were obtained without and with intravenous contrast. CONTRAST:  37mL GADAVIST GADOBUTROL 1 MMOL/ML IV SOLN COMPARISON:  11/04/2019 FINDINGS: Segmentation:  5 lumbar type vertebral bodies. Alignment:  Normal Vertebrae:  Vertebral bodies are normal. Conus medullaris and cauda equina: Conus extends to the L1-2 level. Conus and cauda equina appear normal. Paraspinal and other soft tissues: Negative Disc levels: No evidence of degenerative disc disease or disc space infection. Minimal arthritic change at the L4-5 level, without any evidence of progression or pronounced finding to lend any significant suspicion of septic facet arthritis. IMPRESSION: No evidence of spinal infection. Minimal arthritic change at the L4-5 level. Lack of any progressive change since October argues against infection. Electronically Signed   By: Paulina Fusi M.D.   On: 12/08/2019 15:48       Vedia Coffer for Infectious Disease [facility] [phone] pager 12/09/2019, 8:42 AM

## 2019-12-09 NOTE — Progress Notes (Addendum)
Received patient from 33M. Patient has wound on Lt. Arm posterior area, 33M nurse stated that it caused by infiltration and very tender, so patient refused to change dressing. Dressing was almost off and serosanguineous drainage. Patient refused to touch or apply anything on arm. Will address to MD & wound care nurse. Removed foley cath at 10 am in the morning by ICU nurse then she hasn't voided yet. Checked bladder scan was 504 ml. Patient tried to void but not success. Dr. Katrinka Blazing notified this matter and ordered in and out cath once then if patient still have urinary retention during at night, then put on the f-cath. Patient wanted to sit on the commode and patient voided 500 ml. HS [employer]

## 2019-12-09 NOTE — Progress Notes (Signed)
Pharmacy Electrolyte Replacement  Recent Labs:  Recent Labs    12/08/19 0423 12/09/19 0034 12/09/19 0723  K   < >  --  3.6  MG   < >  --  1.7  PHOS  --  1.8*  --   CREATININE   < >  --  0.55   < > = values in this interval not displayed.    Low Critical Values (K </= 2.5, Phos </= 1, Mg </= 1) Present: None  MD Contacted: Dr. Katrinka Blazing  Plan: KPhos 30 mmol IV x1 and Magnesium 2g IV x1. Repeat labs in AM.   Link Snuffer, PharmD, BCPS, BCCCP Clinical Pharmacist Please refer to [facility] for [facility] Pharmacy numbers 12/09/2019, 10:33 AM

## 2019-12-09 NOTE — Evaluation (Signed)
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 PMhx: admission 10/23-10/26 for endocarditis left AMA, IVDU, cellulitis, depression  Clinical Impression  Pt reporting pain at IJ site and fear of mobility due to fear of pain. Pt aware of situation and place but not time. Pt benefits from encouragement and reassurance throughout. Amber [name] plans to live at [facility] at D/C and states she will be available to assist. Pt with decreased strength, transfers, gait and function limited by pain and cardiopulmonary status who will benefit from acute therapy to maximize mobility and independence to decrease burden of care.   HR 135 with gait SpO2 88-95% on 2L    Follow Up Recommendations Home health PT;Supervision/Assistance - 24 hour    Equipment Recommendations  Rolling walker with 5" wheels;3in1 (PT)    Recommendations for Other Services OT consult     Precautions / Restrictions Precautions Precautions: Fall Precaution Comments: cortrak      Mobility  Bed Mobility Overal bed mobility: Needs Assistance Bed Mobility: Supine to Sit     Supine to sit: Min assist;HOB elevated     General bed mobility comments: HOB 30 degrees with cues and HHA to elevate trunk with increased time    Transfers Overall transfer level: Needs assistance   Transfers: Sit to/from Stand;Stand Pivot Transfers Sit to Stand: Min assist Stand pivot transfers: Min assist       General transfer comment: min assist to rise from surface, cues for hand placement. Pt able to pivot from bed to chair with HHA but had trouble weight shifting without bil UE support. With RW cues for hand placement  Ambulation/Gait Ambulation/Gait assistance: Min assist Gait Distance (Feet): 8 Feet Assistive device:  Rolling walker (2 wheeled) Gait Pattern/deviations: Step-through pattern;Decreased stride length;Shuffle;Trunk flexed   Gait velocity interpretation: 1.31 - 2.62 ft/sec, indicative of limited community ambulator General Gait Details: cues for posture, direction and assist to move RW limited by back pain  Stairs            Wheelchair Mobility    Modified Rankin (Stroke Patients Only)       Balance Overall balance assessment: Needs assistance Sitting-balance support: Feet supported;No upper extremity supported Sitting balance-[name]: Fair     Standing balance support: Single extremity supported;Bilateral upper extremity supported Standing balance-[name]: Poor Standing balance comment: single and bil UE support in standing with need for RW to step                             Pertinent Vitals/Pain Pain Assessment: 0-10 Pain Score: 8  Pain Location: right low back with walking, legs with initial standing, pain at left IJ site Pain Descriptors / Indicators: Aching;Guarding;Stabbing Pain Intervention(s): Limited activity within patient's tolerance;Monitored during session;Repositioned    Home Living Family/patient expects to be discharged to:: Private residence Living Arrangements: Parent Available Help at Discharge: Family Type of Home: House Home Access: Stairs to enter   Secretary/administrator of Steps: 4 Home Layout: One level Home Equipment: Shower seat - built in      Prior Function Level of Independence: Independent               Hand Dominance        Extremity/Trunk Assessment   Upper Extremity  Assessment Upper Extremity Assessment: Generalized weakness    Lower Extremity Assessment Lower Extremity Assessment: Generalized weakness    Cervical / Trunk Assessment Cervical / Trunk Assessment: Other exceptions Cervical / Trunk Exceptions: guarding with pain  Communication   Communication: No difficulties  Cognition  Arousal/Alertness: Awake/alert Behavior During Therapy: Anxious Overall Cognitive Status: Impaired/Different from baseline Area of Impairment: Orientation                 Orientation Level: Time                    General Comments      Exercises     Assessment/Plan    PT Assessment Patient needs continued PT services  PT Problem List Decreased strength;Decreased mobility;Decreased safety awareness;Decreased activity tolerance;Cardiopulmonary status limiting activity;Decreased balance;Decreased knowledge of use of DME;Pain       PT Treatment Interventions Gait training;Balance training;Functional mobility training;Therapeutic activities;Patient/family education;Stair training;DME instruction;Therapeutic exercise    PT Goals (Current goals can be found in the Care Plan section)  Acute Rehab PT Goals Patient Stated Goal: be able to walk without pain, play video games with my nephew PT Goal Formulation: With patient Time For Goal Achievement: 12/23/19 Potential to Achieve Goals: Fair    Frequency Min 3X/week   Barriers to discharge        Co-evaluation               AM-PAC PT "6 Clicks" Mobility  Outcome Measure Help needed turning from your back to your side while in a flat bed without using bedrails?: A Little Help needed moving from lying on your back to sitting on the side of a flat bed without using bedrails?: A Little Help needed moving to and from a bed to a chair (including a wheelchair)?: A Little Help needed standing up from a chair using your arms (e.g., wheelchair or bedside chair)?: A Little Help needed to walk in hospital room?: A Little Help needed climbing 3-5 steps with a railing? : Total 6 Click Score: 16    End of Session Equipment Utilized During Treatment: Oxygen Activity Tolerance: Patient limited by pain Patient left: in chair;with call bell/phone within reach;with chair alarm set Nurse Communication: Mobility status PT Visit  Diagnosis: Other abnormalities of gait and mobility (R26.89);Muscle weakness (generalized) (M62.81);Pain    Time: 9735-3299 PT Time Calculation (min) (ACUTE ONLY): 33 min   Charges:   PT Evaluation $PT Eval Moderate Complexity: 1 Mod PT Treatments $Therapeutic Activity: 8-22 mins        Yomara Toothman P, PT Acute Rehabilitation Services Pager: [phone] Office: [phone]   Glenna Brunkow B Ledford Goodson 12/09/2019, 10:39 AM

## 2019-12-10 LAB — BASIC METABOLIC PANEL
Anion gap: 8 (ref 5–15)
BUN: 8 mg/dL (ref 6–20)
CO2: 26 mmol/L (ref 22–32)
Calcium: 8 mg/dL — ABNORMAL LOW (ref 8.9–10.3)
Chloride: 98 mmol/L (ref 98–111)
Creatinine, Ser: 0.36 mg/dL — ABNORMAL LOW (ref 0.44–1.00)
GFR, Estimated: >60 mL/min (ref >60)
Glucose, Bld: 127 mg/dL — ABNORMAL HIGH (ref 70–99)
Potassium: 4.4 mmol/L (ref 3.5–5.1)
Sodium: 132 mmol/L — ABNORMAL LOW (ref 135–145)

## 2019-12-10 LAB — AEROBIC/ANAEROBIC CULTURE W GRAM STAIN (SURGICAL/DEEP WOUND)

## 2019-12-10 LAB — CBC
HCT: 26.6 % — ABNORMAL LOW (ref 36.0–46.0)
Hemoglobin: 7.8 g/dL — ABNORMAL LOW (ref 12.0–15.0)
MCH: 28.9 pg (ref 26.0–34.0)
MCHC: 29.3 g/dL — ABNORMAL LOW (ref 30.0–36.0)
MCV: 98.5 fL (ref 80.0–100.0)
Platelets: UNDETERMINED 10*3/uL (ref 150–400)
RBC: 2.7 MIL/uL — ABNORMAL LOW (ref 3.87–5.11)
RDW: 27.8 % — ABNORMAL HIGH (ref 11.5–15.5)
WBC: 9.5 10*3/uL (ref 4.0–10.5)
nRBC: 0.3 % — ABNORMAL HIGH (ref 0.0–0.2)

## 2019-12-10 LAB — GLUCOSE, CAPILLARY
Glucose-Capillary: 124 mg/dL — ABNORMAL HIGH (ref 70–99)
Glucose-Capillary: 119 mg/dL — ABNORMAL HIGH (ref 70–99)
Glucose-Capillary: 114 mg/dL — ABNORMAL HIGH (ref 70–99)
Glucose-Capillary: 121 mg/dL — ABNORMAL HIGH (ref 70–99)
Glucose-Capillary: 103 mg/dL — ABNORMAL HIGH (ref 70–99)
Glucose-Capillary: 105 mg/dL — ABNORMAL HIGH (ref 70–99)

## 2019-12-10 LAB — PHOSPHORUS: Phosphorus: 2.2 mg/dL — ABNORMAL LOW (ref 2.5–4.6)

## 2019-12-10 LAB — MAGNESIUM: Magnesium: 1.7 mg/dL (ref 1.7–2.4)

## 2019-12-10 MED ORDER — THIAMINE HCL 100 MG PO TABS
100.0000 mg | ORAL_TABLET | Freq: Every day | ORAL | Status: AC
  Administered 2019-12-10 – 2019-12-16 (×7): 100 mg via ORAL
  Filled 2019-12-10 (×7): qty 1

## 2019-12-10 MED ORDER — MAGNESIUM SULFATE 2 GM/50ML IV SOLN
2.0000 g | Freq: Once | INTRAVENOUS | Status: AC
  Administered 2019-12-10: 2 g via INTRAVENOUS
  Filled 2019-12-10: qty 50

## 2019-12-10 MED ORDER — POTASSIUM & SODIUM PHOSPHATES 280-160-250 MG PO PACK
1.0000 | PACK | Freq: Three times a day (TID) | ORAL | Status: AC
  Administered 2019-12-10 (×3): 1 via ORAL
  Filled 2019-12-10 (×3): qty 1

## 2019-12-10 NOTE — Progress Notes (Signed)
[facility] PROGRESS NOTE    KALEA PERINE  QMG:[id number] DOB: [birth year] DOA: 11/30/2019 PCP: Patient, No Pcp Per      Brief Narrative:  Ms Battaglini is a 29 y.o. F with IVDU and known tricuspid MRSA endocarditis signed out AMA x2 who presented with progressive infection.  10/21 - 10/23: Patient admitted with fever, blood cultures growing MRSA, CT chest showed septic emboli and edematous L4-5 facet joint, and tricuspid vegetation on CT.  ==> left AMA  10/23 - 10/25: Returned after 2 hours with severe back pain.  Underwent TEE that showed 1cm TV mass.  Seen by CT surgery who recommended medical management. ==> left again AMA  In the interim, patient was staying with friends for a few weeks.  Eventually had worsening shortness of breath, weakness, fatigue, malaise and nausea, and decided to return to the hospital.  Arrived in the parking lot obtunded with needle in car on 11/17.  In the ER, hypotensive and started on pressors and blood transfusion and vancomycin and admitted.  Taken to OR for angiovac on admission.         Assessment & Plan:  Septic shock due to TV MRSA endocarditis s/p Angiovac Septic emboli due to MRSA endocarditis Acute hypoxic respiratory failure due to Septic shock Follow-up cultures negative All vascular catheters have been removed MRI spine without deep-seated infections, no other metastatic foci identified, no focal joint pain right now.  TEE done last hospitalization, normal EF.  -Continue daptomycin -Consult ID, appreciate cares -Monitor CK       IVDU Opiate use disorder, severe Opiate withdrawal Delirium -Continue methadone -Continue clonazepam Withdrawal is improving.  Clonazepam will need to be weaned gradually.  Methadone is not an outpatient/discharge option, and so this will have to be replaced with Suboxone if MAT therapy is needed.  Also imperative to transition from IV to oral opiates.  Severe protein calorie  malnutrition Refeeding syndrome As evidenced by severe loss of subcutaneous muscle mass and fat, weight loss >20 lbs, poor PO intake and critical illness.  Refeeding syndrome here, mag and K now normal.  Phos still low -Supplement Phos -Trend Phos -Check hapto/bili   Anemia of chronic disease Thrombocytopenia due to septic shock Transfused 1 unit PRBCs on 11/17, 1 units on 11/22; transfused 1 pack platelets 11/21 Hgb low, at 7 now Platelets improving           Disposition: Status is: Inpatient  Remains inpatient appropriate because:Hemodynamically unstable and Persistent severe electrolyte disturbances  Ongoing IV therapies needed  Dispo: The patient is from: Home              Anticipated d/c is to: Home              Anticipated d/c date is: > 3 days              Patient currently is not medically stable to d/c.              MDM: The below labs and imaging reports were reviewed and summarized above.  Medication management as above. This is a severea cute illness  DVT prophylaxis: enoxaparin (LOVENOX) injection 30 mg Start: 12/01/19 2000 SCDs Start: 11/30/19 1413  Code Status: FULL Family Communication: mother at bedside    Consultants:   CCM  CT surgery  Procedures:   11/17 intubation   11/18 ketamine started  11/22 angiovac  11/23 ketamine stopped  11/24 extubation  11/26 Central line removed  Antimicrobials:  Vancomycin 11/17 >> 11/22  Daptomycin 11/24 >>    Culture data:   11/17 blood culture x2 --- MRSA in 2/2  11/22 angiovac leaflet culture -- mRSA  11/23 blood culture x2 -- NGTD          Subjective: Patient has a cough, sore throat.  She is generally weak.  No confusion, fever.  No vomiting.  No abdominal pain.  Objective: Vitals:   12/10/19 0900 12/10/19 1113 12/10/19 1300 12/10/19 1500  BP:  118/84    Pulse: (!) 112 (!) 113 (!) 110 (!) 110  Resp: (!) 25 20 (!) 23 (!) 26  Temp:  99.2 F (37.3 C)     TempSrc:  Oral    SpO2: 93% 94% 94% 94%  Weight:      Height:        Intake/Output Summary (Last 24 hours) at 12/10/2019 1552 Last data filed at 12/10/2019 1200 Gross per 24 hour  Intake 1495 ml  Output 500 ml  Net 995 ml   Filed Weights   12/08/19 0419 12/09/19 0500 12/10/19 0500  Weight: 54.1 kg 58.3 kg 48.8 kg    Examination: General appearance: Emaciated adult female, sleepy, but arouses and is interactive and in no obvious distress.   HEENT: Anicteric, conjunctiva pink, lids and lashes normal.  No nasal discharge, epistaxis.  Lips dry, dentition normal, oropharynx dry, no oral lesions, hearing normal.   Skin: Warm and dry.  Pale, no jaundice.  Sloughing of skin on the left arm from extravasation from her medicines. Cardiac: Tachycardic, regular, nl S1-S2, systolic murmur noted.  Capillary refill is brisk.  JVP not visible.  Noted LE edema.  Radial pulses 2+ and symmetric. Respiratory: Normal respiratory rate and rhythm.  CTAB without rales or wheezes. Abdomen: Abdomen soft.  No TTP or grimace to palpation or guarding. No ascites, distension, hepatosplenomegaly.   MSK: No deformities or effusions.  Severe diffuse loss of subcutaneous muscle mass and fat. Neuro: Awake and alert.  EOMI, moves all extremities with severe generalized weakness. Speech fluent.    Psych: Sensorium intact and responding to questions, attention normal. Affect blunted.  Judgment and insight appear normal.    Data Reviewed: I have personally reviewed following labs and imaging studies:  CBC: Recent Labs  Lab 12/05/19 1600 12/05/19 2020 12/06/19 1253 12/07/19 0405 12/08/19 0423 12/09/19 0950 12/10/19 0112  WBC 20.6*   < > 21.7* 20.9* 13.4* 9.7 9.5  NEUTROABS 18.5*  --   --   --   --   --   --   HGB 7.7*   < > 8.8* 8.5* 7.2* 7.0* 7.8*  HCT 25.3*   < > 28.3* 27.4* 24.1* 24.3* 26.6*  MCV 93.0   < > 91.0 93.5 96.8 100.4* 98.5  PLT 42*   < > 88* 102* 106* 129* PLATELET CLUMPS NOTED ON SMEAR,  UNABLE TO ESTIMATE   < > = values in this interval not displayed.   Basic Metabolic Panel: Recent Labs  Lab 12/06/19 0929 12/06/19 1700 12/07/19 0405 12/07/19 1735 12/08/19 0423 12/09/19 0034 12/09/19 0723 12/10/19 0439  NA 138  --  139  --  138  --  136 132*  K 4.5  --  3.7  --  2.3*  --  3.6 4.4  CL 107  --  109  --  101  --  102 98  CO2 18*  --  19*  --  23  --  25 26  GLUCOSE 126*  --  127*  --  119*  --  121* 127*  BUN 29*  --  31*  --  18  --  8 8  CREATININE 0.79  --  0.81  --  0.57  --  0.55 0.36*  CALCIUM 7.8*  --  7.6*  --  7.9*  --  7.7* 8.0*  MG 2.1   < > 1.8 1.6* 1.5*  --  1.7 1.7  PHOS 5.5*   < > 4.3 3.5 2.0* 1.8*  --  2.2*   < > = values in this interval not displayed.   GFR: Estimated Creatinine Clearance: 79.9 mL/min (A) (by C-G formula based on SCr of 0.36 mg/dL (L)). Liver Function Tests: Recent Labs  Lab 12/05/19 0120 12/07/19 0405  AST 126* 112*  ALT 60* 50*  ALKPHOS 188* 120  BILITOT 1.1 1.4*  PROT 6.0* 5.2*  ALBUMIN 1.5* 1.7*   No results for input(s): LIPASE, AMYLASE in the last 168 hours. No results for input(s): AMMONIA in the last 168 hours. Coagulation Profile: No results for input(s): INR, PROTIME in the last 168 hours. Cardiac Enzymes: Recent Labs  Lab 12/07/19 0405 12/08/19 0423  CKTOTAL 96 85   BNP (last 3 results) No results for input(s): PROBNP in the last 8760 hours. HbA1C: No results for input(s): HGBA1C in the last 72 hours. CBG: Recent Labs  Lab 12/09/19 2017 12/10/19 0101 12/10/19 0347 12/10/19 0736 12/10/19 1138  GLUCAP 119* 124* 119* 114* 121*   Lipid Profile: No results for input(s): CHOL, HDL, LDLCALC, TRIG, CHOLHDL, LDLDIRECT in the last 72 hours. Thyroid Function Tests: No results for input(s): TSH, T4TOTAL, FREET4, T3FREE, THYROIDAB in the last 72 hours. Anemia Panel: No results for input(s): VITAMINB12, FOLATE, FERRITIN, TIBC, IRON, RETICCTPCT in the last 72 hours. Urine analysis:    Component  Value Date/Time   COLORURINE AMBER (A) 11/03/2019 0818   APPEARANCEUR CLOUDY (A) 11/03/2019 0818   APPEARANCEUR Turbid 01/27/2014 1811   LABSPEC 1.017 11/03/2019 0818   LABSPEC 1.011 01/27/2014 1811   PHURINE 5.0 11/03/2019 0818   GLUCOSEU NEGATIVE 11/03/2019 0818   GLUCOSEU Negative 01/27/2014 1811   HGBUR NEGATIVE 11/03/2019 0818   BILIRUBINUR NEGATIVE 11/03/2019 0818   BILIRUBINUR Negative 01/27/2014 1811   KETONESUR NEGATIVE 11/03/2019 0818   PROTEINUR NEGATIVE 11/03/2019 0818   UROBILINOGEN 0.2 10/14/2012 2010   NITRITE NEGATIVE 11/03/2019 0818   LEUKOCYTESUR MODERATE (A) 11/03/2019 0818   LEUKOCYTESUR Negative 01/27/2014 1811   Sepsis Labs: @LABRCNTIP (procalcitonin:4,lacticacidven:4)  ) Recent Results (from the past 240 hour(s))  MRSA PCR Screening     Status: Abnormal   Collection Time: 11/30/19  4:58 PM   Specimen: Nasopharyngeal  Result Value Ref Range Status   MRSA by PCR POSITIVE (A) NEGATIVE Final    Comment:        The GeneXpert MRSA Assay (FDA approved for NASAL specimens only), is one component of a comprehensive MRSA colonization surveillance program. It is not intended to diagnose MRSA infection nor to guide or monitor treatment for MRSA infections. RESULT CALLED TO, READ BACK BY AND VERIFIED WITH: 12/02/19 RN 11/30/19 AT 2231 SK Performed at [facility], [address], [place], [place]   Blood Culture (routine x 2)     Status: Abnormal   Collection Time: 11/30/19  5:46 PM   Specimen: Blood  Result Value Ref Range Status   Specimen Description BLOOD RIGHT HAND  Final   Special Requests   Final    BOTTLES DRAWN AEROBIC  AND ANAEROBIC Blood Culture results may not be optimal due to an inadequate volume of blood received in culture bottles   Culture  Setup Time   Final    GRAM POSITIVE COCCI IN BOTH AEROBIC AND ANAEROBIC BOTTLES CRITICAL VALUE NOTED.  VALUE IS CONSISTENT WITH PREVIOUSLY REPORTED AND CALLED VALUE.    Culture (A)   Final    STAPHYLOCOCCUS AUREUS SUSCEPTIBILITIES PERFORMED ON PREVIOUS CULTURE WITHIN THE LAST 5 DAYS. Performed at [facility], [address] [place], [place] [address]    Report Status 12/04/2019 FINAL  Final  Fungus Culture With Stain     Status: None (Preliminary result)   Collection Time: 12/05/19 10:02 AM   Specimen: PATH Soft tissue  Result Value Ref Range Status   Fungus Stain Final report  Final    Comment: (NOTE) Performed At: [facility] [address] [place], [place] [id number] Jolene Schimke MD [id number]    Fungus (Mycology) Culture PENDING  Incomplete   Fungal Source VALVE  Final    Comment: Performed at [facility], [address] [place], [place] [address]  Aerobic/Anaerobic Culture (surgical/deep wound)     Status: None   Collection Time: 12/05/19 10:02 AM   Specimen: PATH Soft tissue  Result Value Ref Range Status   Specimen Description VALVE  Final   Special Requests A TRICUSPID VALVE VEGETATION  Final   Gram Stain   Final    ABUNDANT WBC PRESENT, PREDOMINANTLY PMN ABUNDANT GRAM POSITIVE COCCI    Culture   Final    ABUNDANT METHICILLIN RESISTANT STAPHYLOCOCCUS AUREUS NO ANAEROBES ISOLATED Performed at [facility], [address], [place], [place] [address]    Report Status 12/10/2019 FINAL  Final   Organism ID, Bacteria METHICILLIN RESISTANT STAPHYLOCOCCUS AUREUS  Final      Susceptibility   Methicillin resistant staphylococcus aureus - MIC*    CIPROFLOXACIN >=8 RESISTANT Resistant     ERYTHROMYCIN >=8 RESISTANT Resistant     GENTAMICIN <=0.5 SENSITIVE Sensitive     OXACILLIN >=4 RESISTANT Resistant     TETRACYCLINE 2 SENSITIVE Sensitive     VANCOMYCIN 1 SENSITIVE Sensitive     TRIMETH/SULFA >=320 RESISTANT Resistant     CLINDAMYCIN >=8 RESISTANT Resistant     RIFAMPIN <=0.5 SENSITIVE Sensitive     Inducible Clindamycin NEGATIVE Sensitive     * ABUNDANT METHICILLIN RESISTANT STAPHYLOCOCCUS AUREUS  Acid Fast Smear  (AFB)     Status: None   Collection Time: 12/05/19 10:02 AM   Specimen: PATH Soft tissue  Result Value Ref Range Status   AFB Specimen Processing Comment  Final    Comment: Tissue Grinding and Digestion/Decontamination   Acid Fast Smear Negative  Final    Comment: (NOTE) Performed At: [facility] [address] [place], [place] [id number] Jolene Schimke MD [id number]:[id number]    Source (AFB) VALVE  Final    Comment: Performed at [facility], [address] [place], [place] [address]  Fungus Culture Result     Status: None   Collection Time: 12/05/19 10:02 AM  Result Value Ref Range Status   Result 1 Comment  Final    Comment: (NOTE) KOH/Calcofluor preparation:  no fungus observed. Performed At: [facility] [address] [place], [place] [id number] Jolene Schimke MD [id number]:[id number]   Culture, blood (routine x 2)     Status: None (Preliminary result)   Collection Time: 12/06/19 11:16 AM   Specimen: BLOOD  Result Value Ref Range Status  Specimen Description BLOOD SITE NOT SPECIFIED  Final   Special Requests   Final    BOTTLES DRAWN AEROBIC AND ANAEROBIC Blood Culture adequate volume   Culture   Final    NO GROWTH 4 DAYS Performed at [facility], [address] [place], [place] [address]    Report Status PENDING  Incomplete  Culture, blood (routine x 2)     Status: None (Preliminary result)   Collection Time: 12/06/19  5:52 PM   Specimen: BLOOD  Result Value Ref Range Status   Specimen Description BLOOD SITE NOT SPECIFIED  Final   Special Requests   Final    BOTTLES DRAWN AEROBIC AND ANAEROBIC Blood Culture adequate volume   Culture   Final    NO GROWTH 4 DAYS Performed at [facility], [address] [place], [place] [address]    Report Status PENDING  Incomplete         Radiology Studies: No results found.      Scheduled Meds: . sodium chloride   Intravenous Once  . chlorhexidine  15 mL Mouth Rinse BID  . Chlorhexidine  Gluconate Cloth  6 each Topical Daily  . clonazePAM  0.5 mg Oral TID  . enoxaparin (LOVENOX) injection  30 mg Subcutaneous Q24H  . mouth rinse  15 mL Mouth Rinse q12n4p  . methadone  20 mg Oral Q8H  . potassium & sodium phosphates  1 packet Oral TID WC & HS  . sodium chloride flush  10-40 mL Intracatheter Q12H  . thiamine  100 mg Oral Daily   Continuous Infusions: . sodium chloride 10 mL/hr at 12/05/19 0617  . sodium chloride    . sodium chloride Stopped (12/09/19 1242)  . DAPTOmycin (CUBICIN)  IV 500 mg (12/09/19 2226)  . feeding supplement (JEVITY 1.2 CAL) 65 mL/hr at 12/10/19 0800  . magnesium sulfate bolus IVPB 2 g (12/10/19 1455)     LOS: 10 days    Time spent: 35 minutes    Alberteen Samhristopher P Roniel Halloran, MD [facility] 12/10/2019, 3:52 PM     Please page though AMION or Epic secure chat:  For [employer] password, Higher education careers advisercontact charge nurse

## 2019-12-11 ENCOUNTER — Inpatient Hospital Stay (HOSPITAL_COMMUNITY): Payer: Self-pay

## 2019-12-11 LAB — CBC
HCT: 23.8 % — ABNORMAL LOW (ref 36.0–46.0)
Hemoglobin: 6.9 g/dL — CL (ref 12.0–15.0)
MCH: 28.8 pg (ref 26.0–34.0)
MCHC: 29 g/dL — ABNORMAL LOW (ref 30.0–36.0)
MCV: 99.2 fL (ref 80.0–100.0)
Platelets: 123 10*3/uL — ABNORMAL LOW (ref 150–400)
RBC: 2.4 MIL/uL — ABNORMAL LOW (ref 3.87–5.11)
RDW: 26.4 % — ABNORMAL HIGH (ref 11.5–15.5)
WBC: 8.9 10*3/uL (ref 4.0–10.5)
nRBC: 0 % (ref 0.0–0.2)

## 2019-12-11 LAB — GLUCOSE, CAPILLARY
Glucose-Capillary: 103 mg/dL — ABNORMAL HIGH (ref 70–99)
Glucose-Capillary: 121 mg/dL — ABNORMAL HIGH (ref 70–99)
Glucose-Capillary: 138 mg/dL — ABNORMAL HIGH (ref 70–99)
Glucose-Capillary: 54 mg/dL — ABNORMAL LOW (ref 70–99)
Glucose-Capillary: 61 mg/dL — ABNORMAL LOW (ref 70–99)
Glucose-Capillary: 63 mg/dL — ABNORMAL LOW (ref 70–99)
Glucose-Capillary: 93 mg/dL (ref 70–99)
Glucose-Capillary: 99 mg/dL (ref 70–99)

## 2019-12-11 LAB — BILIRUBIN, FRACTIONATED(TOT/DIR/INDIR)
Bilirubin, Direct: 0.4 mg/dL — ABNORMAL HIGH (ref 0.0–0.2)
Indirect Bilirubin: 0.5 mg/dL (ref 0.3–0.9)
Total Bilirubin: 0.9 mg/dL (ref 0.3–1.2)

## 2019-12-11 LAB — MAGNESIUM: Magnesium: 1.7 mg/dL (ref 1.7–2.4)

## 2019-12-11 LAB — CULTURE, BLOOD (ROUTINE X 2)
Culture: NO GROWTH
Culture: NO GROWTH
Special Requests: ADEQUATE
Special Requests: ADEQUATE

## 2019-12-11 LAB — BASIC METABOLIC PANEL
Anion gap: 9 (ref 5–15)
BUN: 9 mg/dL (ref 6–20)
CO2: 27 mmol/L (ref 22–32)
Calcium: 7.9 mg/dL — ABNORMAL LOW (ref 8.9–10.3)
Chloride: 98 mmol/L (ref 98–111)
Creatinine, Ser: 0.39 mg/dL — ABNORMAL LOW (ref 0.44–1.00)
GFR, Estimated: >60 mL/min (ref >60)
Glucose, Bld: 131 mg/dL — ABNORMAL HIGH (ref 70–99)
Potassium: 4.7 mmol/L (ref 3.5–5.1)
Sodium: 134 mmol/L — ABNORMAL LOW (ref 135–145)

## 2019-12-11 LAB — PHOSPHORUS: Phosphorus: 2.4 mg/dL — ABNORMAL LOW (ref 2.5–4.6)

## 2019-12-11 LAB — PREPARE RBC (CROSSMATCH)

## 2019-12-11 LAB — CK: Total CK: 50 U/L (ref 38–234)

## 2019-12-11 IMAGING — DX DG CHEST 1V PORT
1 series · 1 of 1 positions shown · non-contrast
Comparison: Chest radiograph dated [DATE].

CLINICAL DATA: Shortness of breath

EXAM:
PORTABLE CHEST 1 VIEW

[chest]
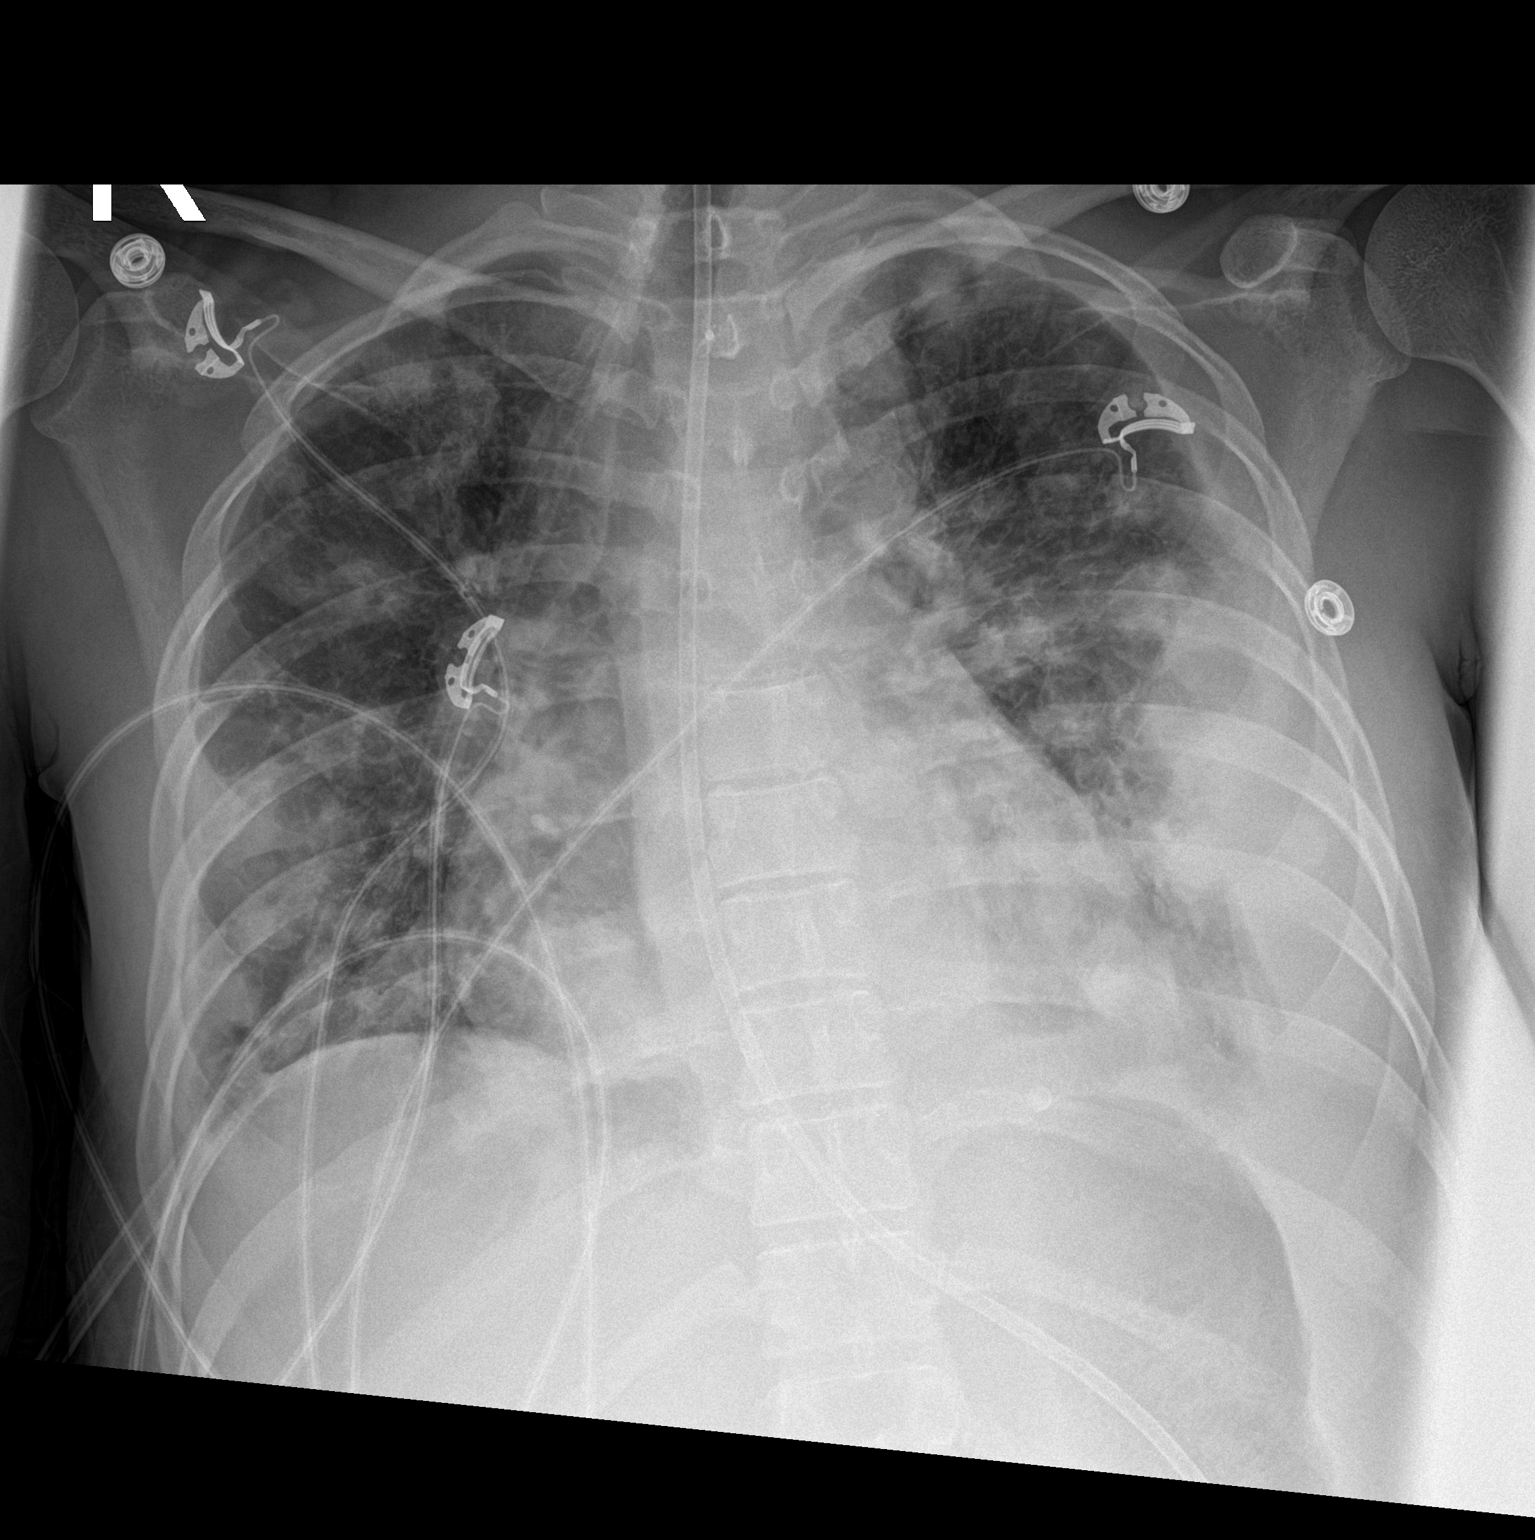

[1 of 1 positions shown; findings below may reference images not displayed]

FINDINGS: The cardiac silhouette is unchanged. There is increased
consolidation/opacity of the lower lateral left lung. Moderate
patchy bilateral airspace opacities are unchanged. A left pleural
effusion likely contributes. There is no right pleural effusion.
There is no pneumothorax. An enteric tube enters the stomach and
terminates below the field of view.
IMPRESSION: 1. Increased consolidation/opacity of the lower lateral left lung. A
left pleural effusion likely contributes.
2. Unchanged moderate patchy bilateral airspace opacities.

## 2019-12-11 IMAGING — DX DG ABD PORTABLE 1V
1 series · 1 of 1 positions shown · non-contrast
Comparison: Abdominal radiograph dated [DATE].

CLINICAL DATA: Abdominal pain and distension.

EXAM:
PORTABLE ABDOMEN - 1 VIEW

[abdomen]
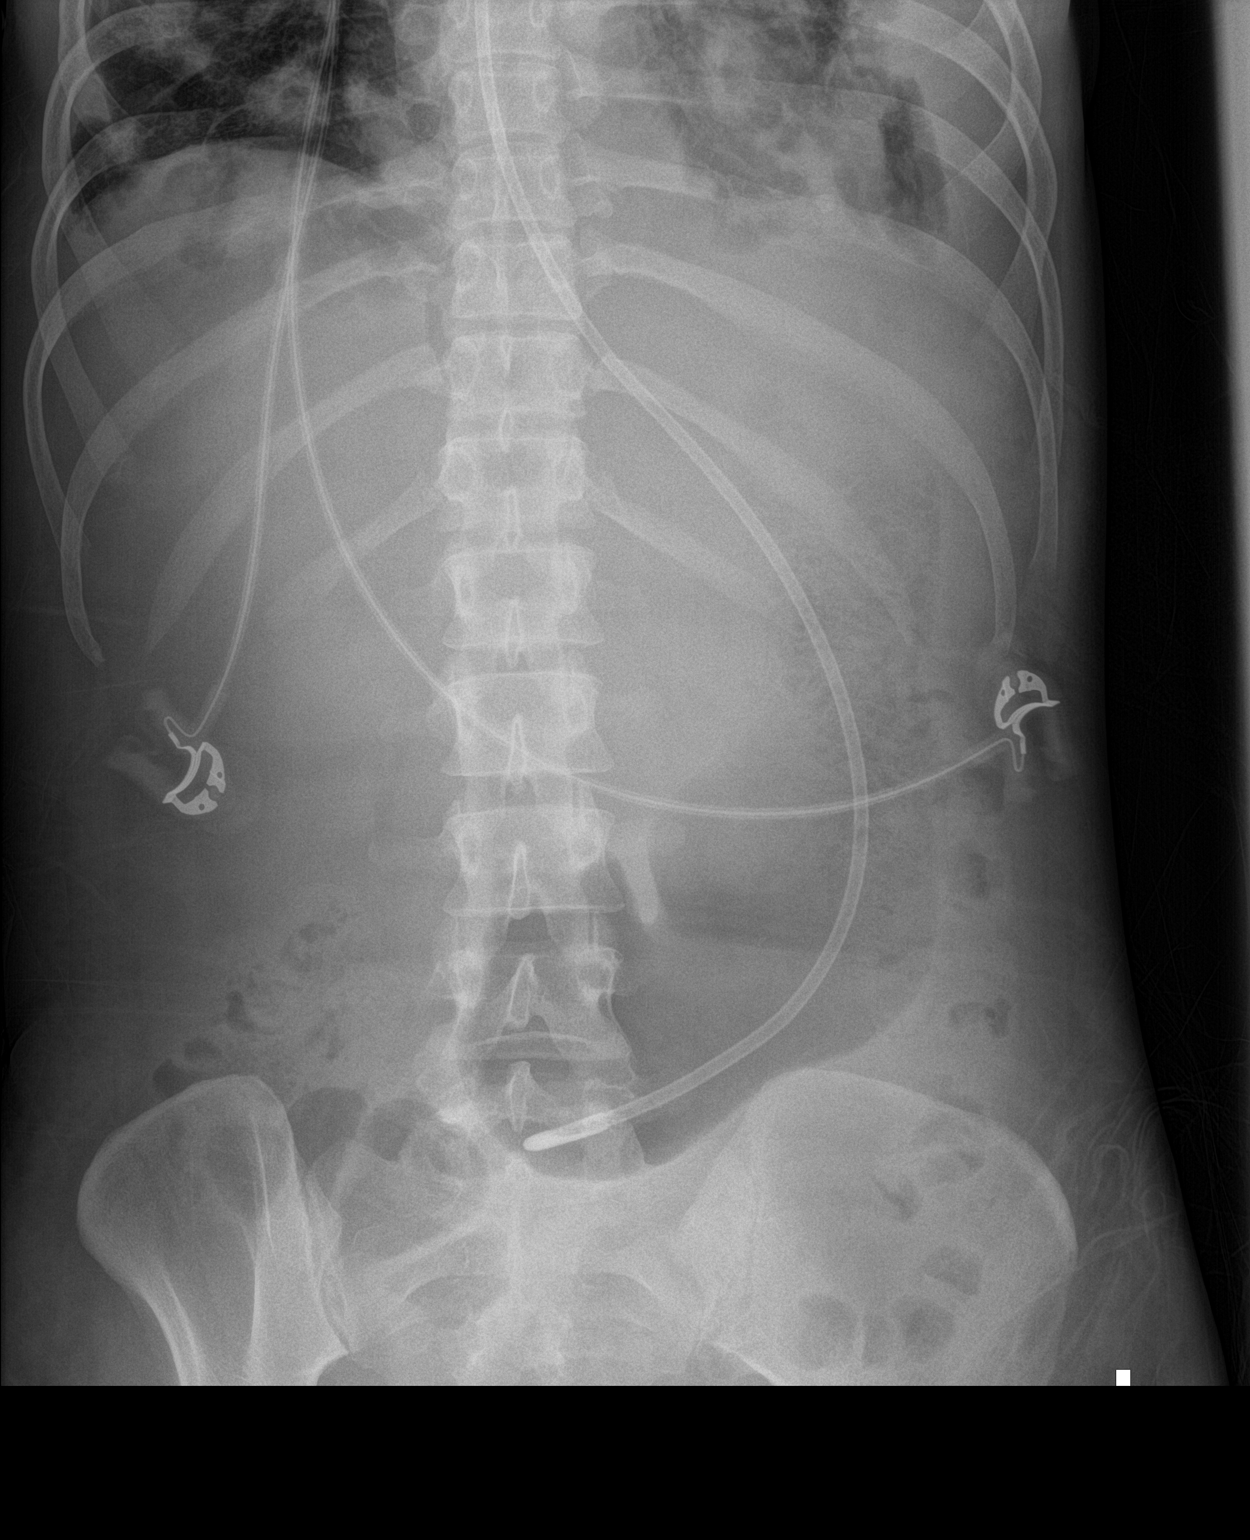

[1 of 1 positions shown; findings below may reference images not displayed]

FINDINGS: An enteric tube terminates in the stomach. There is a nonobstructive
bowel gas pattern. No radiopaque calculi are noted overlying the
urinary tract.
IMPRESSION: Enteric tube terminates in the stomach.

## 2019-12-11 MED ORDER — DIPHENHYDRAMINE HCL 25 MG PO CAPS: 25.0000 mg | ORAL_CAPSULE | Freq: Once | ORAL | Status: DC

## 2019-12-11 MED ORDER — DIPHENHYDRAMINE HCL 12.5 MG/5ML PO ELIX
25.0000 mg | ORAL_SOLUTION | Freq: Once | ORAL | Status: AC
  Administered 2019-12-11: 25 mg
  Filled 2019-12-11: qty 10

## 2019-12-11 MED ORDER — JEVITY 1.2 CAL PO LIQD
1000.0000 mL | ORAL | Status: DC
  Administered 2019-12-11: 1000 mL
  Filled 2019-12-11: qty 1000

## 2019-12-11 MED ORDER — SODIUM CHLORIDE 0.9% IV SOLUTION: Freq: Once | INTRAVENOUS | Status: DC

## 2019-12-11 MED ORDER — ACETAMINOPHEN 325 MG PO TABS
650.0000 mg | ORAL_TABLET | Freq: Once | ORAL | Status: AC
  Administered 2019-12-11: 650 mg via ORAL
  Filled 2019-12-11: qty 2

## 2019-12-11 NOTE — Progress Notes (Signed)
Hypoglycemic Event  CBG: 61  Treatment: 8 0z juice   Symptoms Asymptomatic  :Follow-up CBG: Time:0045  CBG Result: 99  Possible Reasons for Event: Patient skipped dinner  Comments/MD notified:Patient asymptomatic     Jodean Lima

## 2019-12-11 NOTE — Progress Notes (Signed)
Started blood transfusion and patient understood well premedications for it. Patient stated that her stomach feel full and pain, can't breathe. She requested to stop tube feeding, at the same time she was coughing. Dr. Maryfrances Bunnell notified this matter. HS [employer]

## 2019-12-11 NOTE — Progress Notes (Signed)
Addressed Dr. Maryfrances Bunnell regarding patient's Lt. Arm wound and need to do wound care consult for it. Dr. Maryfrances Bunnell will put on the order for wound care consult. Continue to check on wound, patient refused to touch or look at. HS [employer]

## 2019-12-11 NOTE — Progress Notes (Signed)
[facility] PROGRESS NOTE    Amber [name]  RUE:[id number] DOB: [birth year] DOA: 11/30/2019 PCP: Patient, No Pcp Per      Brief Narrative:  Ms Amber [name] is a 29 y.o. F with IVDU and known tricuspid MRSA endocarditis signed out AMA x2 who presented with progressive infection.  10/21 - 10/23: Patient admitted with fever, blood cultures growing MRSA, CT chest showed septic emboli and edematous L4-5 facet joint, and tricuspid vegetation on CT.  ==> left AMA  10/23 - 10/25: Returned after 2 hours with severe back pain.  Underwent TEE that showed 1cm TV mass.  Seen by CT surgery who recommended medical management. ==> left again AMA  In the interim, patient was staying with friends for a few weeks.  Eventually had worsening shortness of breath, weakness, fatigue, malaise and nausea, and decided to return to the hospital.  Arrived in the parking lot obtunded with needle in car on 11/17.  In the ER, hypotensive and started on pressors and blood transfusion and vancomycin and admitted.  Taken to OR for angiovac on admission.         Assessment & Plan:  Septic shock due to TV MRSA endocarditis s/p Angiovac Septic emboli due to MRSA endocarditis Acute hypoxic respiratory failure due to Septic shock Admitted and started on vancomycin.  Initial cultures growing MRSA still. Follow-up cultures 11/23-no growth to date. All vascular catheters have been removed MRI spine without deep-seated infections, no other metastatic foci identified, no focal joint pain right now.  TEE done last hospitalization, normal EF.  Afebrile, CK normal. -Continue daptomycin -Consult ID, appreciate cares -Monitor CK      Tricuspid insufficiency She remains tachycardic and edematous -Obtain limited echo to evaluate TV and EF   Ileus Patient with abdominal distention, abdominal pain, and feeling bloated today.  Abdomen x-ray personally reviewed, shows no obstruction, free air. -Stop tube  feeds for today, attempt to restart gradually tomorrow  IV infiltration The patient's left arm has a spot where one of her IVs infiltrated.  I presume this was a pressure, though documentation is unclear from the date in the ICU when this happened -Will discuss with Plastic surgery   IVDU Opiate use disorder, severe Opiate withdrawal Delirium -Continue methadone -Continue clonazepam Withdrawal is improving.  Clonazepam will need to be weaned gradually.  Methadone is not an outpatient/discharge option, and so this will have to be replaced with Suboxone if MAT therapy is needed.  Also imperative to transition from IV to oral opiates.  Severe protein calorie malnutrition Refeeding syndrome Patient has malnourishment as evidenced by severe loss of subcutaneous muscle mass and fat, weight loss >20 lbs, poor PO intake and critical illness.  She has also had mild refeeding syndrome evidenced by hypomagnesemia, hypokalemia, hypophosphatemia, and (to some extent) swelling. K, mag, and Phos repleted.  Thankfully no signs of hemolysis, rhabdo, or other dangerous complications of refeeding syndrome. -Trend Phos     Anemia of chronic disease Thrombocytopenia due to septic shock Transfused 1 unit PRBCs on 11/17, 1 units on 11/22; transfused 1 pack platelets 11/21 Hemoglobin 6.9 this morning Platelets improving No evidence of hemolysis -Transfuse 1 unit -Transfusion threshold 8 g/dL          Disposition: Status is: Inpatient  Remains inpatient appropriate because:Hemodynamically unstable and Persistent severe electrolyte disturbances  Ongoing IV therapies needed  Dispo: The patient is from: Home              Anticipated d/c is to:  Home              Anticipated d/c date is: > 3 days              Patient currently is not medically stable to d/c.    The patient was admitted with MRSA tricuspid endocarditis.  Although her infection appears to be defervescing, she is significantly  malnourished, anemic, and with tricuspid insufficiency.  ID are directing her antibiotic therapy.  We will repeat echo to evaluate TV function and enlist assistance of Cardiology if she has significant TR.      MDM: The below labs and imaging reports were reviewed and summarized above.  Medication management as above.  This is a severe life-threatening illness.  DVT prophylaxis: SCDs Start: 11/30/19 1413  Code Status: FULL Family Communication:      Consultants:   CCM  CT surgery  Procedures:   11/17 intubation   11/18 ketamine started  11/22 angiovac  11/23 ketamine stopped  11/24 extubation  11/26 Central line removed  Antimicrobials:   Vancomycin 11/17 >> 11/22  Daptomycin 11/24 >>    Culture data:   11/17 blood culture x2 --- MRSA in 2/2  11/22 angiovac leaflet culture -- mRSA  11/23 blood culture x2 -- NGTD          Subjective: She has abdominal pain, distention.  She has weakness, but no vomiting, diarrhea, confusion, or fever.  She has a persistent cough.  Objective: Vitals:   12/11/19 0734 12/11/19 1222 12/11/19 1301 12/11/19 1400  BP: 120/83 122/84    Pulse: (!) 108 (!) 115 (!) 108 (!) 103  Resp: 17 (!) 21 (!) 26 (!) 23  Temp: 99.7 F (37.6 C) 99.8 F (37.7 C) 99.1 F (37.3 C)   TempSrc: Oral Oral Oral   SpO2: 99% 96% 91% 97%  Weight:      Height:        Intake/Output Summary (Last 24 hours) at 12/11/2019 1608 Last data filed at 12/11/2019 1200 Gross per 24 hour  Intake 1453.75 ml  Output 1050 ml  Net 403.75 ml   Filed Weights   12/09/19 0500 12/10/19 0500 12/11/19 0300  Weight: 58.3 kg 48.8 kg 50 kg    Examination: General appearance: Emaciated adult female, sleepy, arouses and is interactive, but then falls back asleep     HEENT: Anicteric, conjunctival pink, stenosis emaciated.  Lips are dry, dentition normal, oropharynx dry, tongue smooth, no oral lesions, hearing normal Skin: Eschar is formed over the left arm  IV infiltration.  No surrounding cellulitis Cardiac: Tachycardic, regular, systolic murmur noted, I feel this radiates to the right.  Capillary refill normal, JVP not visible, pitting edema in both arms and legs. Respiratory: Slightly tachypneic, lung sounds diminished, no rales or wheezes.  Tachypneic mostly due to abdominal distention. Abdomen: Less distended, she has no focal tenderness palpation, no guarding, but some mild diffuse tenderness, and what feels like ascites. MSK: Diffuse loss of subcutaneous muscle mass intact Neuro: Awake but sleepy.  Moves all extremities with severe generalized weakness.  Speech fluent positive speech Psych: Responds to questions, attention diminished, affect blunted, judgment insight appear normal     Data Reviewed: I have personally reviewed following labs and imaging studies:  CBC: Recent Labs  Lab 12/05/19 1600 12/05/19 2020 12/07/19 0405 12/08/19 0423 12/09/19 0950 12/10/19 0112 12/11/19 0507  WBC 20.6*   < > 20.9* 13.4* 9.7 9.5 8.9  NEUTROABS 18.5*  --   --   --   --   --   --  HGB 7.7*   < > 8.5* 7.2* 7.0* 7.8* 6.9*  HCT 25.3*   < > 27.4* 24.1* 24.3* 26.6* 23.8*  MCV 93.0   < > 93.5 96.8 100.4* 98.5 99.2  PLT 42*   < > 102* 106* 129* PLATELET CLUMPS NOTED ON SMEAR, UNABLE TO ESTIMATE 123*   < > = values in this interval not displayed.   Basic Metabolic Panel: Recent Labs  Lab 12/07/19 0405 12/07/19 0405 12/07/19 1735 12/08/19 0423 12/09/19 0034 12/09/19 0723 12/10/19 0439 12/11/19 0507  NA 139  --   --  138  --  136 132* 134*  K 3.7  --   --  2.3*  --  3.6 4.4 4.7  CL 109  --   --  101  --  102 98 98  CO2 19*  --   --  23  --  25 26 27   GLUCOSE 127*  --   --  119*  --  121* 127* 131*  BUN 31*  --   --  18  --  8 8 9   CREATININE 0.81  --   --  0.57  --  0.55 0.36* 0.39*  CALCIUM 7.6*  --   --  7.9*  --  7.7* 8.0* 7.9*  MG 1.8   < > 1.6* 1.5*  --  1.7 1.7 1.7  PHOS 4.3   < > 3.5 2.0* 1.8*  --  2.2* 2.4*   < > = values in  this interval not displayed.   GFR: Estimated Creatinine Clearance: 81.9 mL/min (A) (by C-G formula based on SCr of 0.39 mg/dL (L)). Liver Function Tests: Recent Labs  Lab 12/05/19 0120 12/07/19 0405 12/11/19 0507  AST 126* 112*  --   ALT 60* 50*  --   ALKPHOS 188* 120  --   BILITOT 1.1 1.4* 0.9  PROT 6.0* 5.2*  --   ALBUMIN 1.5* 1.7*  --    No results for input(s): LIPASE, AMYLASE in the last 168 hours. No results for input(s): AMMONIA in the last 168 hours. Coagulation Profile: No results for input(s): INR, PROTIME in the last 168 hours. Cardiac Enzymes: Recent Labs  Lab 12/07/19 0405 12/08/19 0423 12/11/19 0507  CKTOTAL 96 85 50   BNP (last 3 results) No results for input(s): PROBNP in the last 8760 hours. HbA1C: No results for input(s): HGBA1C in the last 72 hours. CBG: Recent Labs  Lab 12/11/19 0046 12/11/19 0430 12/11/19 0725 12/11/19 1143 12/11/19 1600  GLUCAP 99 138* 121* 103* 93   Lipid Profile: No results for input(s): CHOL, HDL, LDLCALC, TRIG, CHOLHDL, LDLDIRECT in the last 72 hours. Thyroid Function Tests: No results for input(s): TSH, T4TOTAL, FREET4, T3FREE, THYROIDAB in the last 72 hours. Anemia Panel: No results for input(s): VITAMINB12, FOLATE, FERRITIN, TIBC, IRON, RETICCTPCT in the last 72 hours. Urine analysis:    Component Value Date/Time   COLORURINE AMBER (A) 11/03/2019 0818   APPEARANCEUR CLOUDY (A) 11/03/2019 0818   APPEARANCEUR Turbid 01/27/2014 1811   LABSPEC 1.017 11/03/2019 0818   LABSPEC 1.011 01/27/2014 1811   PHURINE 5.0 11/03/2019 0818   GLUCOSEU NEGATIVE 11/03/2019 0818   GLUCOSEU Negative 01/27/2014 1811   HGBUR NEGATIVE 11/03/2019 0818   BILIRUBINUR NEGATIVE 11/03/2019 0818   BILIRUBINUR Negative 01/27/2014 1811   KETONESUR NEGATIVE 11/03/2019 0818   PROTEINUR NEGATIVE 11/03/2019 0818   UROBILINOGEN 0.2 10/14/2012 2010   NITRITE NEGATIVE 11/03/2019 0818   LEUKOCYTESUR MODERATE (A) 11/03/2019 0818   LEUKOCYTESUR  Negative 01/27/2014  1811   Sepsis Labs: @LABRCNTIP (procalcitonin:4,lacticacidven:4)  ) Recent Results (from the past 240 hour(s))  Fungus Culture With Stain     Status: None (Preliminary result)   Collection Time: 12/05/19 10:02 AM   Specimen: PATH Soft tissue  Result Value Ref Range Status   Fungus Stain Final report  Final    Comment: (NOTE) Performed At: [facility] [address] [place], [place] [id number] MD Jolene Schimke    Fungus (Mycology) Culture PENDING  Incomplete   Fungal Source VALVE  Final    Comment: Performed at [facility], [address], [place], [place]  Aerobic/Anaerobic Culture (surgical/deep wound)     Status: None   Collection Time: 12/05/19 10:02 AM   Specimen: PATH Soft tissue  Result Value Ref Range Status   Specimen Description VALVE  Final   Special Requests A TRICUSPID VALVE VEGETATION  Final   Gram Stain   Final    ABUNDANT WBC PRESENT, PREDOMINANTLY PMN ABUNDANT GRAM POSITIVE COCCI    Culture   Final    ABUNDANT METHICILLIN RESISTANT STAPHYLOCOCCUS AUREUS NO ANAEROBES ISOLATED Performed at [facility], [address] [place], [place]    Report Status 12/10/2019 FINAL  Final   Organism ID, Bacteria METHICILLIN RESISTANT STAPHYLOCOCCUS AUREUS  Final      Susceptibility   Methicillin resistant staphylococcus aureus - MIC*    CIPROFLOXACIN >=8 RESISTANT Resistant     ERYTHROMYCIN >=8 RESISTANT Resistant     GENTAMICIN <=0.5 SENSITIVE Sensitive     OXACILLIN >=4 RESISTANT Resistant     TETRACYCLINE 2 SENSITIVE Sensitive     VANCOMYCIN 1 SENSITIVE Sensitive     TRIMETH/SULFA >=320 RESISTANT Resistant     CLINDAMYCIN >=8 RESISTANT Resistant     RIFAMPIN <=0.5 SENSITIVE Sensitive     Inducible Clindamycin NEGATIVE Sensitive     * ABUNDANT METHICILLIN RESISTANT STAPHYLOCOCCUS AUREUS  Acid Fast Smear (AFB)     Status: None   Collection Time: 12/05/19 10:02 AM   Specimen: PATH Soft  tissue  Result Value Ref Range Status   AFB Specimen Processing Comment  Final    Comment: Tissue Grinding and Digestion/Decontamination   Acid Fast Smear Negative  Final    Comment: (NOTE) Performed At: [facility] [address] [place], [place] [id number] MD Jolene Schimke    Source (AFB) VALVE  Final    Comment: Performed at [facility], [address] [place], [place]  Fungus Culture Result     Status: None   Collection Time: 12/05/19 10:02 AM  Result Value Ref Range Status   Result 1 Comment  Final    Comment: (NOTE) KOH/Calcofluor preparation:  no fungus observed. Performed At: [facility] [address] [place], [place] [phone] MD Jolene Schimke   Culture, blood (routine x 2)     Status: None   Collection Time: 12/06/19 11:16 AM   Specimen: BLOOD  Result Value Ref Range Status   Specimen Description BLOOD SITE NOT SPECIFIED  Final   Special Requests   Final    BOTTLES DRAWN AEROBIC AND ANAEROBIC Blood Culture adequate volume   Culture   Final    NO GROWTH 5 DAYS Performed at [facility], [address], [place], [place]    Report Status 12/11/2019 FINAL  Final  Culture, blood (routine x 2)     Status: None   Collection Time: 12/06/19  5:52 PM  Specimen: BLOOD  Result Value Ref Range Status   Specimen Description BLOOD SITE NOT SPECIFIED  Final   Special Requests   Final    BOTTLES DRAWN AEROBIC AND ANAEROBIC Blood Culture adequate volume   Culture   Final    NO GROWTH 5 DAYS Performed at [facility], [address] [place], [place] [address]    Report Status 12/11/2019 FINAL  Final         Radiology Studies: DG CHEST PORT 1 VIEW  Result Date: 12/11/2019 CLINICAL DATA:  Shortness of breath EXAM: PORTABLE CHEST 1 VIEW COMPARISON:  Chest radiograph dated 12/05/2019. FINDINGS: The cardiac silhouette is unchanged. There is increased consolidation/opacity of the lower  lateral left lung. Moderate patchy bilateral airspace opacities are unchanged. A left pleural effusion likely contributes. There is no right pleural effusion. There is no pneumothorax. An enteric tube enters the stomach and terminates below the field of view. IMPRESSION: 1. Increased consolidation/opacity of the lower lateral left lung. A left pleural effusion likely contributes. 2. Unchanged moderate patchy bilateral airspace opacities. Electronically Signed   By: Romona Curls M.D.   On: 12/11/2019 14:29   DG Abd Portable 1V  Result Date: 12/11/2019 CLINICAL DATA:  Abdominal pain and distension. EXAM: PORTABLE ABDOMEN - 1 VIEW COMPARISON:  Abdominal radiograph dated 12/05/2019. FINDINGS: An enteric tube terminates in the stomach. There is a nonobstructive bowel gas pattern. No radiopaque calculi are noted overlying the urinary tract. IMPRESSION: Enteric tube terminates in the stomach. Electronically Signed   By: Romona Curls M.D.   On: 12/11/2019 14:30        Scheduled Meds: . sodium chloride   Intravenous Once  . sodium chloride   Intravenous Once  . chlorhexidine  15 mL Mouth Rinse BID  . Chlorhexidine Gluconate Cloth  6 each Topical Daily  . clonazePAM  0.5 mg Oral TID  . mouth rinse  15 mL Mouth Rinse q12n4p  . methadone  20 mg Oral Q8H  . sodium chloride flush  10-40 mL Intracatheter Q12H  . thiamine  100 mg Oral Daily   Continuous Infusions: . sodium chloride 10 mL/hr at 12/05/19 0617  . sodium chloride    . sodium chloride Stopped (12/09/19 1242)  . DAPTOmycin (CUBICIN)  IV 500 mg (12/10/19 2228)  . feeding supplement (JEVITY 1.2 CAL) Stopped (12/11/19 1300)     LOS: 11 days    Time spent: 35 minutes    Alberteen Sam, MD [facility] 12/11/2019, 4:08 PM     Please page though AMION or Epic secure chat:  For [employer], Higher education careers adviser

## 2019-12-11 NOTE — Progress Notes (Signed)
CRITICAL VALUE ALERT  Critical Value: Hgb: 6.9  Date & Time Notied:  12/11/2019 @0605   Provider Notified: , MD  Orders Received/Actions taken: 1 Unit PRBC

## 2019-12-11 NOTE — Progress Notes (Signed)
Notified by RN that Hgb this am is 6.9. Hgb was 7.8 yesterday.  Transfuse one unit PRBC.  Recheck H/H 2 hours after transfusion.

## 2019-12-11 NOTE — Progress Notes (Signed)
Brief Nutrition Note:   Received page from MD regarding reassessment of nutrition poc given concern for refeeding syndrome.   Upon initiation of TF, pt with signs of refeeding given decreased in electrolytes with hypokalemia, hypomagnesemia and hypophosphatemia. TF started at low rate and advanced slowly to goal.  Currently potassium 4.4 (wdl), phosphorus 2.2 (L), magnesium 1.7 (wdl). Phosphorus being supplemented  Tolerating Jevity 1.2 at 65 ml/hr via Cortrak  Interventions:   Recommend Thiamine 100 mg daily x 7 days   Continue phosphorus supplementation   Continue Jevity 1.2 at goal rate of 65 ml/hr as tolerated  If phosphorus <2.0 or if pt with signs/symptoms of hypophosphatemia, consider reducing TF to half goal rate until phosphorus at goal   Romelle Starcher MS, RDN, LDN, CNSC Registered Dietitian III Clinical Nutrition RD Pager and On-Call Pager Number Located in [place]

## 2019-12-11 NOTE — Consult Note (Signed)
WOC Nurse Consult Note: Reason for Consult: left arm Wound type: IV inflitrate Pressure Injury POA:NA Measurement: 11cm x 6cm x 0.1cm  Wound CEY:EMVV purple with eschar formation centrally and blistering at the distal and proximal wound edges.  Drainage (amount, consistency, odor) bloody; serous filled blisters Periwound: edema; palpable radial pulses  Dressing procedure/placement/frequency: Non adherent (xeroform gauze) dry dressing topper, kerlix. Change every other day to protect and insulate.  Concerned with eschar formation this quickly post infiltrate that this will evolve further would recommend hand/arm surgeon or plastics to evaluate. Wound will worsen despite topical care.   Discussed POC with patient and bedside nurse.  Re consult if needed, will not follow at this time. Thanks  Rigby Swamy [employer], RN,CWOCN, CNS, CWON-AP [phone])

## 2019-12-11 NOTE — Progress Notes (Signed)
Patient was c/o abd pain with difficult breath. 20-30 minutes later patient was calm down and resting, sounds like sleep. Dr. Maryfrances Bunnell ordered CXR & Abd X-ray.  Dr. Maryfrances Bunnell checked patient in the room. Continue to stop tube feeding except meds. Wound care nurse came to assess on Lt. Arm wound. Applied Xeroform, dry gauze and covered wound. Updated to pt's mom. HS [employer]

## 2019-12-12 ENCOUNTER — Inpatient Hospital Stay (HOSPITAL_COMMUNITY): Payer: Self-pay

## 2019-12-12 DIAGNOSIS — R7989 Other specified abnormal findings of blood chemistry: Secondary | ICD-10-CM

## 2019-12-12 DIAGNOSIS — R059 Cough, unspecified: Secondary | ICD-10-CM

## 2019-12-12 DIAGNOSIS — I361 Nonrheumatic tricuspid (valve) insufficiency: Secondary | ICD-10-CM

## 2019-12-12 DIAGNOSIS — S51802A Unspecified open wound of left forearm, initial encounter: Secondary | ICD-10-CM

## 2019-12-12 DIAGNOSIS — T8089XA Other complications following infusion, transfusion and therapeutic injection, initial encounter: Secondary | ICD-10-CM

## 2019-12-12 LAB — BPAM RBC
Blood Product Expiration Date: 202112292359
ISSUE DATE / TIME: 202111281235
Unit Type and Rh: 6200

## 2019-12-12 LAB — GLUCOSE, CAPILLARY
Glucose-Capillary: 119 mg/dL — ABNORMAL HIGH (ref 70–99)
Glucose-Capillary: 53 mg/dL — ABNORMAL LOW (ref 70–99)
Glucose-Capillary: 55 mg/dL — ABNORMAL LOW (ref 70–99)
Glucose-Capillary: 63 mg/dL — ABNORMAL LOW (ref 70–99)
Glucose-Capillary: 65 mg/dL — ABNORMAL LOW (ref 70–99)
Glucose-Capillary: 67 mg/dL — ABNORMAL LOW (ref 70–99)
Glucose-Capillary: 71 mg/dL (ref 70–99)
Glucose-Capillary: 76 mg/dL (ref 70–99)
Glucose-Capillary: 91 mg/dL (ref 70–99)

## 2019-12-12 LAB — COMPREHENSIVE METABOLIC PANEL
ALT: 79 U/L — ABNORMAL HIGH (ref 0–44)
AST: 319 U/L — ABNORMAL HIGH (ref 15–41)
Albumin: 2.2 g/dL — ABNORMAL LOW (ref 3.5–5.0)
Alkaline Phosphatase: 147 U/L — ABNORMAL HIGH (ref 38–126)
Anion gap: 10 (ref 5–15)
BUN: 10 mg/dL (ref 6–20)
CO2: 23 mmol/L (ref 22–32)
Calcium: 8.1 mg/dL — ABNORMAL LOW (ref 8.9–10.3)
Chloride: 98 mmol/L (ref 98–111)
Creatinine, Ser: <0.3 mg/dL — ABNORMAL LOW (ref 0.44–1.00)
Glucose, Bld: 82 mg/dL (ref 70–99)
Potassium: 4.5 mmol/L (ref 3.5–5.1)
Sodium: 131 mmol/L — ABNORMAL LOW (ref 135–145)
Total Bilirubin: 1.4 mg/dL — ABNORMAL HIGH (ref 0.3–1.2)
Total Protein: 5.8 g/dL — ABNORMAL LOW (ref 6.5–8.1)

## 2019-12-12 LAB — ECHOCARDIOGRAM LIMITED
Height: 62 in
Weight: 1816.59 oz

## 2019-12-12 LAB — CBC
HCT: 28.8 % — ABNORMAL LOW (ref 36.0–46.0)
Hemoglobin: 8.8 g/dL — ABNORMAL LOW (ref 12.0–15.0)
MCH: 29 pg (ref 26.0–34.0)
MCHC: 30.6 g/dL (ref 30.0–36.0)
MCV: 95 fL (ref 80.0–100.0)
Platelets: 148 10*3/uL — ABNORMAL LOW (ref 150–400)
RBC: 3.03 MIL/uL — ABNORMAL LOW (ref 3.87–5.11)
RDW: 22.9 % — ABNORMAL HIGH (ref 11.5–15.5)
WBC: 7.3 10*3/uL (ref 4.0–10.5)
nRBC: 0 % (ref 0.0–0.2)

## 2019-12-12 LAB — MAGNESIUM: Magnesium: 1.7 mg/dL (ref 1.7–2.4)

## 2019-12-12 LAB — TYPE AND SCREEN
ABO/RH(D): A POS
Antibody Screen: NEGATIVE
Unit division: 0

## 2019-12-12 LAB — HAPTOGLOBIN: Haptoglobin: 119 mg/dL (ref 33–278)

## 2019-12-12 LAB — PHOSPHORUS: Phosphorus: 2.7 mg/dL (ref 2.5–4.6)

## 2019-12-12 IMAGING — US US ABDOMEN COMPLETE
1 series · 14 of 25 positions shown · non-contrast
Comparison: None.

CLINICAL DATA: Elevated LFTs

EXAM:
ABDOMEN ULTRASOUND COMPLETE

[Series 1: us abdomen complete · 14 of 119 slices shown]
[im 1/119]
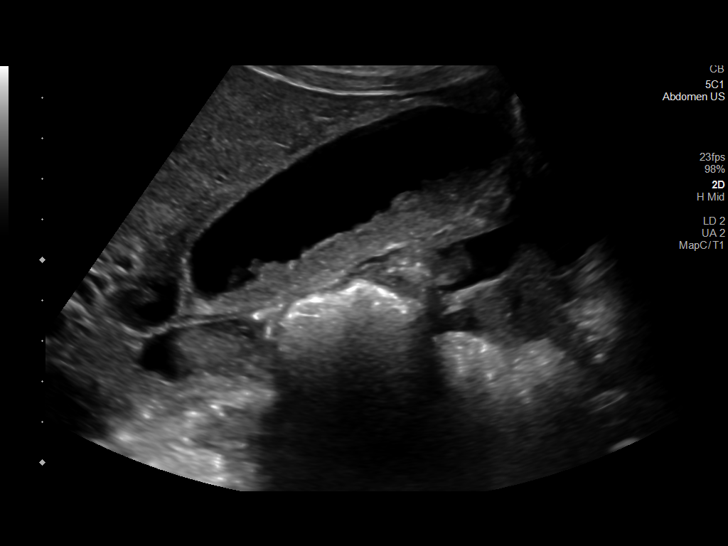
[im 10/119]
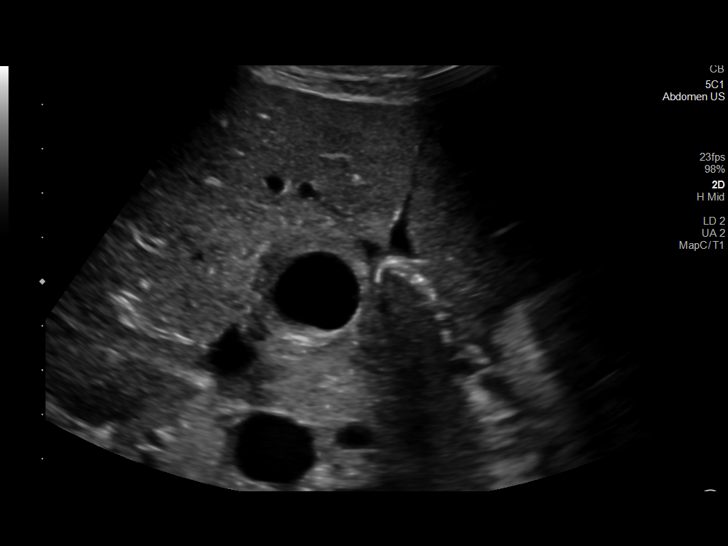
[im 20/119]
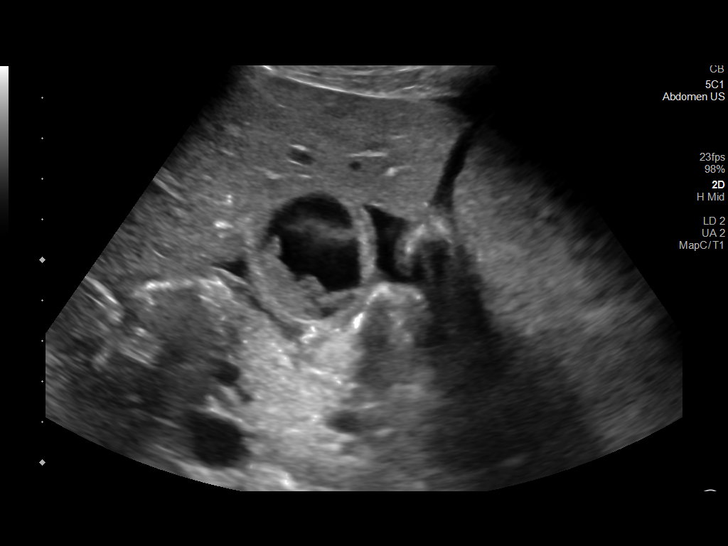
[im 30/119]
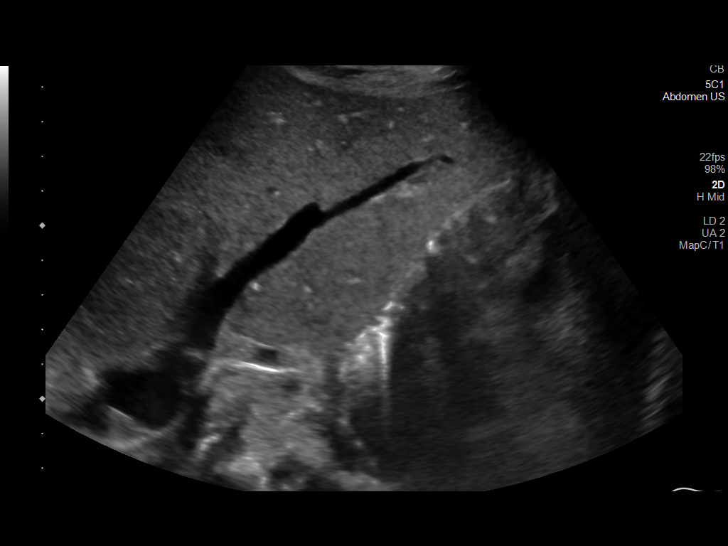
[im 40/119]
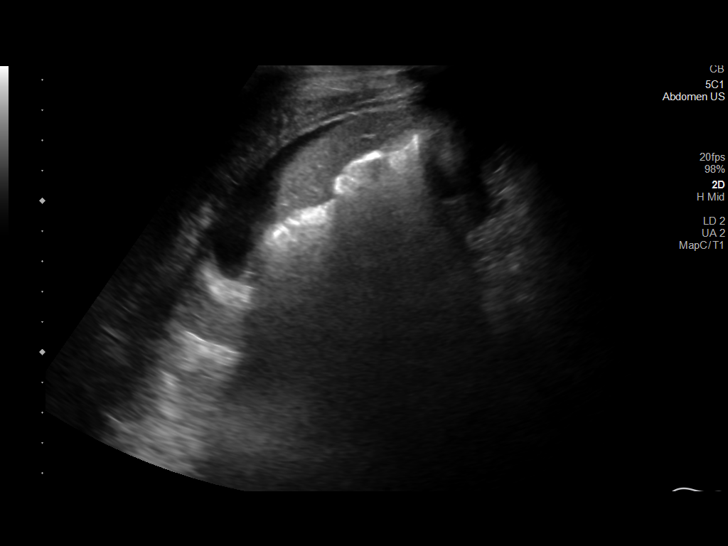
[im 45/119]
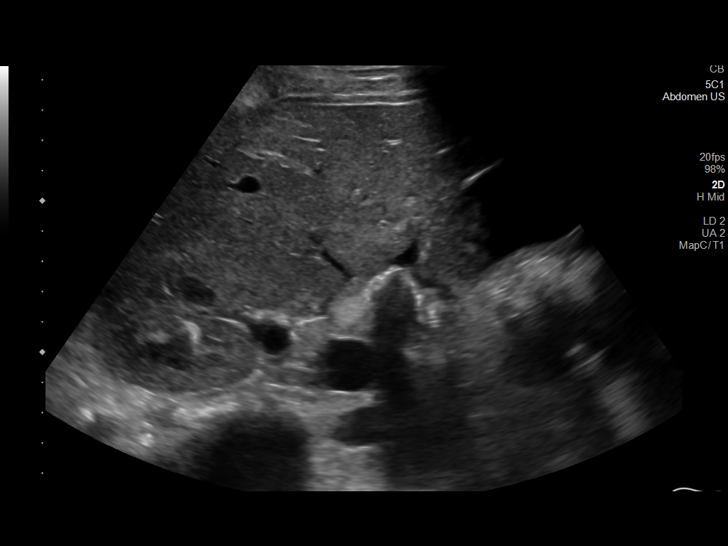
[im 55/119]
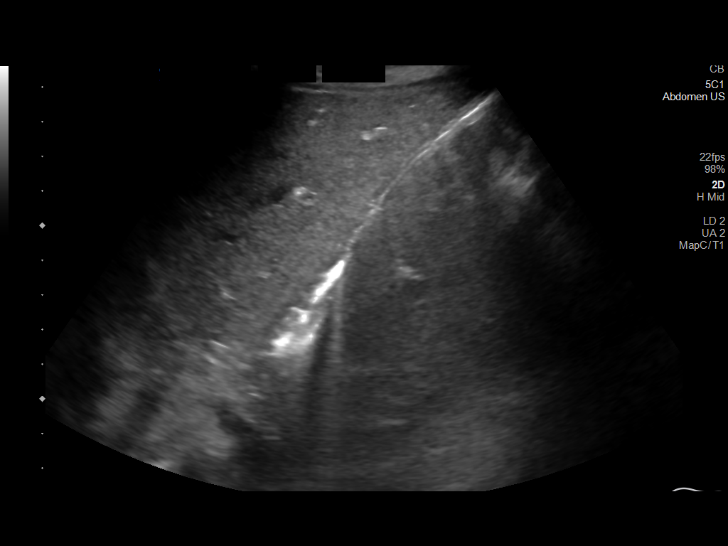
[im 64/119]
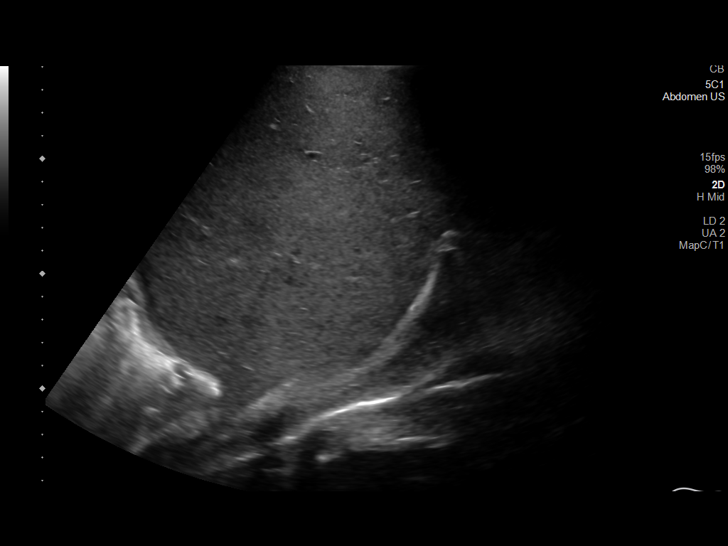
[im 74/119]
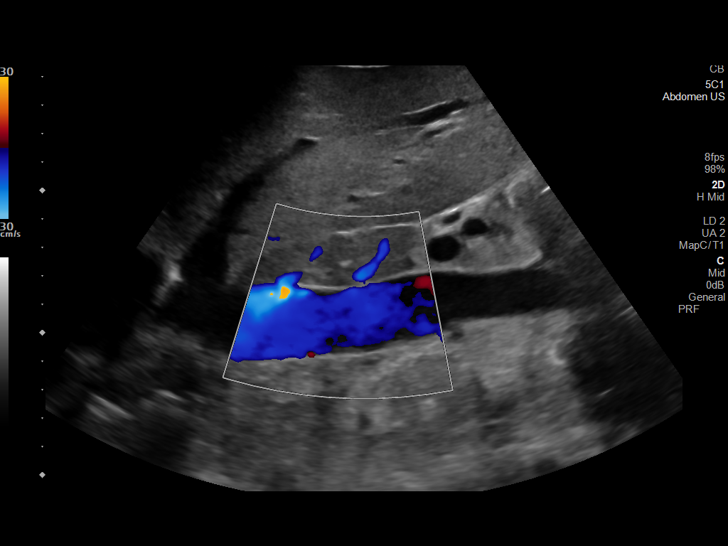
[im 79/119]
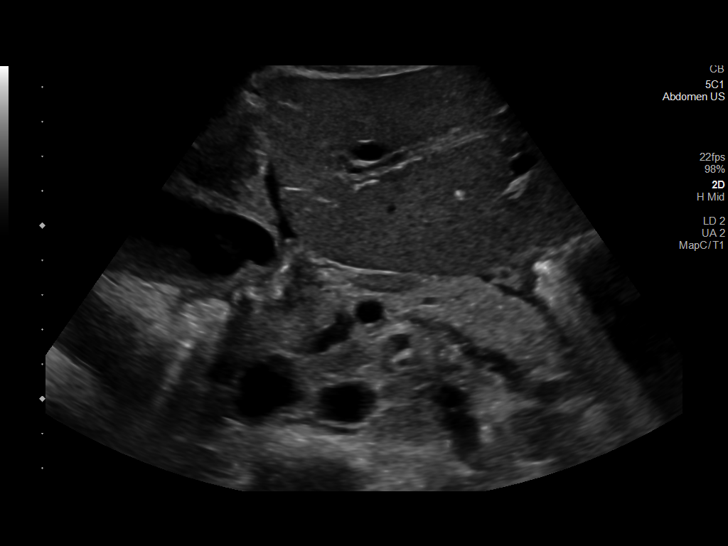
[im 89/119]
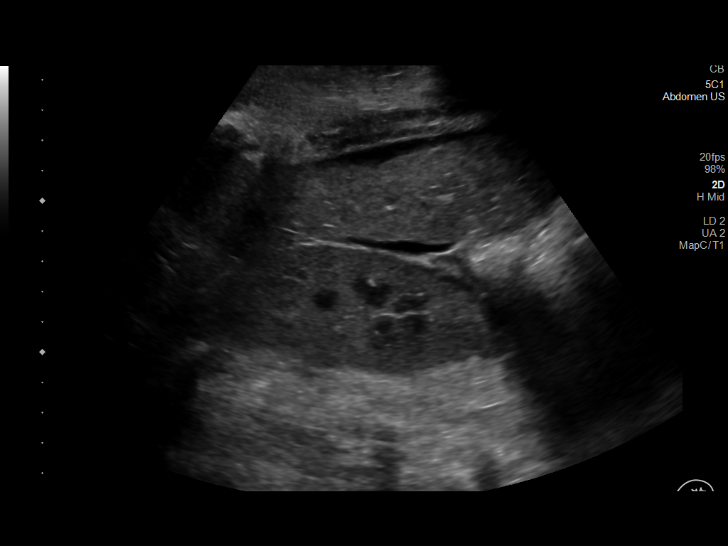
[im 99/119]
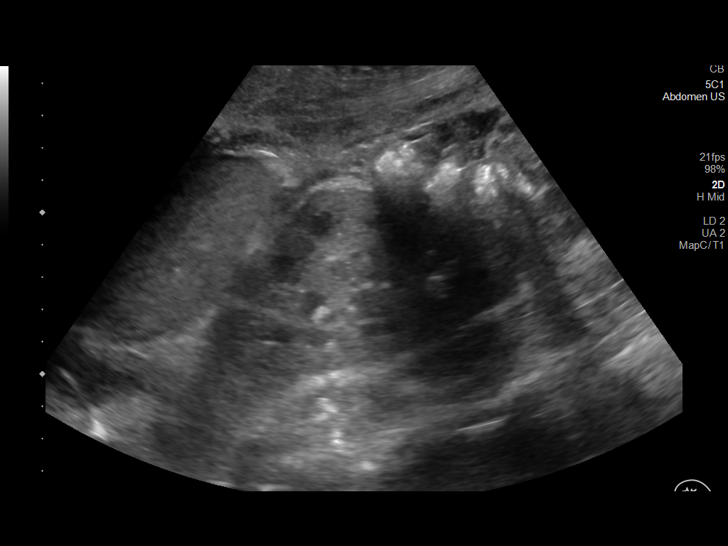
[im 109/119]
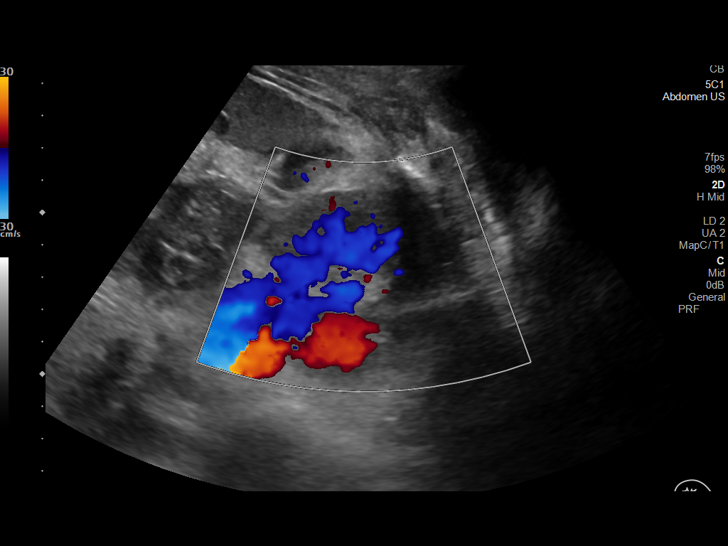
[im 119/119]
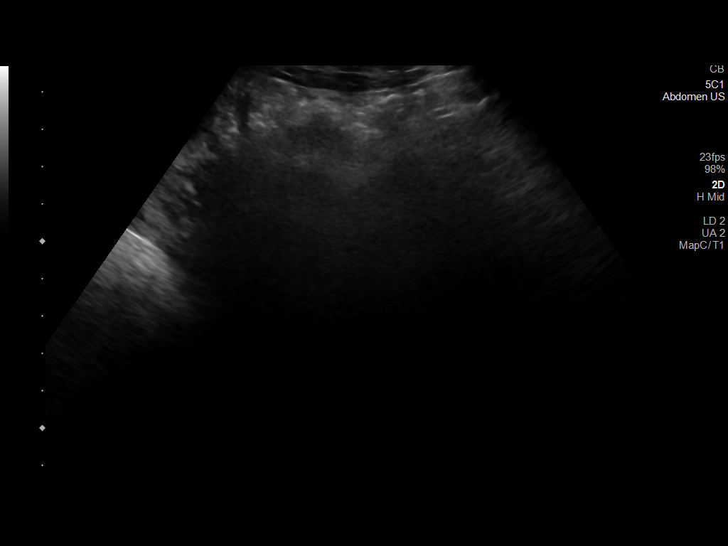

[14 of 25 positions shown; findings below may reference images not displayed]

FINDINGS: Gallbladder: Gallbladder is well distended with tumefactive sludge
identified. No wall thickening or pericholecystic fluid is noted. No
gallstones are seen.

Common bile duct: Diameter: 4.2 mm.

Liver: No focal lesion identified. Within normal limits in
parenchymal echogenicity. Portal vein is patent on color Doppler
imaging with normal direction of blood flow towards the liver.

IVC: No abnormality visualized.

Pancreas: Visualized portion unremarkable.

Spleen: Spleen is elongated but demonstrates its normal spleniform
shape. Calculated volume is within normal limits.

Right Kidney: Length: 12.2 cm. Echogenicity within normal limits.
No mass or hydronephrosis visualized.

Left Kidney: Length: 10.6 cm. Echogenicity within normal limits. No
mass or hydronephrosis visualized.

Abdominal aorta: No aneurysm visualized.

Other findings: Mild ascites is noted. Small peripancreatic lymph
nodes are noted but not significant by size criteria
IMPRESSION: Gallbladder sludge without complicating factors.

Elongated spleen with normal shape and normal volume.

Mild ascites.

## 2019-12-12 MED ORDER — DEXTROSE 50 % IV SOLN
INTRAVENOUS | Status: AC
  Administered 2019-12-12: 25 mL
  Filled 2019-12-12: qty 50

## 2019-12-12 MED ORDER — CLONAZEPAM 0.5 MG PO TABS
0.5000 mg | ORAL_TABLET | Freq: Two times a day (BID) | ORAL | Status: DC
  Administered 2019-12-12 – 2019-12-20 (×16): 0.5 mg via ORAL
  Filled 2019-12-12 (×17): qty 1

## 2019-12-12 MED ORDER — ENSURE ENLIVE PO LIQD
237.0000 mL | Freq: Three times a day (TID) | ORAL | Status: DC
  Administered 2019-12-12 – 2020-01-15 (×58): 237 mL via ORAL
  Filled 2019-12-12 (×2): qty 237

## 2019-12-12 MED ORDER — COLLAGENASE 250 UNIT/GM EX OINT
TOPICAL_OINTMENT | Freq: Every day | CUTANEOUS | Status: AC
  Administered 2019-12-25: 1 via TOPICAL
  Filled 2019-12-12 (×4): qty 30

## 2019-12-12 MED ORDER — DEXTROSE 50 % IV SOLN
INTRAVENOUS | Status: AC
  Administered 2019-12-12: 50 mL
  Filled 2019-12-12: qty 50

## 2019-12-12 NOTE — Progress Notes (Addendum)
Arrived to patient's room. Eating at this time, requested VAST to return when done. Nurse notified. Tomasita Morrow, RN VAST

## 2019-12-12 NOTE — Progress Notes (Signed)
Dressing changed to wound of the left forearm top with saline moist gauze with Kerlix. Pt tolerated fine.

## 2019-12-12 NOTE — Progress Notes (Signed)
[facility] PROGRESS NOTE    Amber [name]  LNL:[id number] DOB: 18-May-1990 DOA: 11/30/2019 PCP: Patient, No Pcp Per      Brief Narrative:  Amber [name] is a 29 y.o. F with IVDU and known tricuspid MRSA endocarditis signed out AMA x2 who presented with progressive infection.  10/21 - 10/23: Patient admitted with fever, blood cultures growing MRSA, CT chest showed septic emboli and edematous L4-5 facet joint, and tricuspid vegetation on CT.  ==> left AMA  10/23 - 10/25: Returned after 2 hours with severe back pain.  Underwent TEE that showed 1cm TV mass.  Seen by CT surgery who recommended medical management. ==> left again AMA  In the interim, patient was staying with friends for a few weeks.  Eventually had worsening shortness of breath, weakness, fatigue, malaise and nausea, and decided to return to the hospital.  Arrived in the parking lot obtunded with needle in car on 11/17.  In the ER, hypotensive and started on pressors and blood transfusion and vancomycin and admitted.  Taken to OR for angiovac on admission.         Assessment & Plan:  Septic shock due to TV MRSA endocarditis s/p Angiovac Septic emboli due to MRSA endocarditis Acute hypoxic respiratory failure due to Septic shock Admitted and started on vancomycin.  Initial cultures growing MRSA still. Follow-up cultures 11/23-no growth to date. All vascular catheters have been removed MRI spine without deep-seated infections, no other metastatic foci identified, no focal joint pain right now.  TEE done last hospitalization, normal EF.  ID are confident daptomycin will cover her septic lung emboli  Remains afebrile -Continue daptomycin -Consult ID, appreciate cares -Monitor CK      Tricuspid insufficiency She remains tachycardic and edematous -Obtain limited echo to evaluate TV and EF  Transaminitis Present previously in the hospitalization, and attributed to sepsis, still present now.   Possibly congestive given TR.  Possibly viral.  Less likely biliary obstruction.   -Obtain RUQ [name] -Check hep C rna  Hep C Ab postiive HIV negative -Check Hepatitis rna  Bloating/abdominal pain Patient with abdominal distention, abdominal pain, and feeling bloated 11/27, continues  Abd xray unremarkalbe.  Had BM yesterday, still bloated -Hold Tube feeds for now, try to advance diet -Check RUQ [name]  IV infiltration This occurred on 11/18 per notes.  Unclear what the infiltrate was.  However gradually some necrosis and eschar has developed on his arm.  Discussed with WOCN today, we will try enzymatic debridement over the next several days, if this does not improve things, she may need surgical debridement by plastic surgery but we both do not believe this is necessary at this point.    IVDU Opiate use disorder, severe Opiate withdrawal Delirium -Continue methadone -Continue clonazepam, taper dose Withdrawal is resolved, pain is controlled.    Methadone at 60 mg per day is not an outpatient/discharge option.  She will need to taper to 30-40 or replace this Suboxone prior to discharge.     Severe protein calorie malnutrition Refeeding syndrome Patient has malnourishment as evidenced by severe loss of subcutaneous muscle mass and fat, weight loss >20 lbs, poor PO intake and critical illness.  K, mag, and Phos resolved   Anemia of chronic disease Thrombocytopenia due to septic shock Transfused 1 unit PRBCs on 11/17, 1 units on 11/22; transfused 1 pack platelets 11/21 Transfused again 11/28 Platelets stable at 140s No evidence of hemolysis Hgb up to mid 8s today, improved -Transfusion threshold 8  g/dL          Disposition: Status is: Inpatient  Remains inpatient appropriate because:Hemodynamically unstable and Persistent severe electrolyte disturbances  Ongoing IV therapies needed  Dispo: The patient is from: Home              Anticipated d/c is to: Home               Anticipated d/c date is: > 3 days              Patient currently is not medically stable to d/c.    The patient was admitted with MRSA tricuspid endocarditis.  Although her infection appears to be defervescing, she is significantly malnourished, anemic, and with tricuspid insufficiency.  ID are directing her antibiotic therapy.  We will repeat echo to evaluate TV function and enlist assistance of Cardiology if she has significant TR.      MDM: The below labs and imaging reports were reviewed and summarized above.  Medication management as above.  This is a severe life-threatening illness.  DVT prophylaxis: SCDs Start: 11/30/19 1413  Code Status: FULL Family Communication:      Consultants:   CCM  CT surgery  Procedures:   11/17 intubation   11/18 ketamine started  11/22 angiovac  11/23 ketamine stopped  11/24 extubation  11/26 Central line removed  Antimicrobials:   Vancomycin 11/17 >> 11/22  Daptomycin 11/24 >>    Culture data:   11/17 blood culture x2 --- MRSA in 2/2  11/22 angiovac leaflet culture -- mRSA  11/23 blood culture x2 -- NGTD          Subjective: She still distended and has abdominal discomfort and nausea and less appetite.  No vomiting, diarrhea, confusion.  Her breathing feels okay, her cough is improving.  She is extremely weak generally.        Objective: Vitals:   12/12/19 0002 12/12/19 0300 12/12/19 0500 12/12/19 0752  BP: 111/79 116/77  (!) 139/100  Pulse: 91 90  (!) 105  Resp: 17 14  18   Temp: 98.2 F (36.8 C) 98 F (36.7 C)  99.4 F (37.4 C)  TempSrc: Oral Oral  Oral  SpO2: 96% 100%  96%  Weight:   51.5 kg   Height:        Intake/Output Summary (Last 24 hours) at 12/12/2019 1103 Last data filed at 12/12/2019 0430 Gross per 24 hour  Intake 908.75 ml  Output 950 ml  Net -41.25 ml   Filed Weights   12/10/19 0500 12/11/19 0300 12/12/19 0500  Weight: 48.8 kg 50 kg 51.5 kg    Examination: General  appearance: Adult female, appears weak, lying in bed, interactive     HEENT: Anicteric, conjunctival pink, nose emaciated.  Lips dry, dentition normal, oropharynx dry, tongue smooth, no oral lesions, hearing normal Skin: Eschar over the left arm. Cardiac: Tachycardic, regular, systolic murmur noted, mostly on the right, JVP not visible, pitting edema in arms and legs, slightly better from yesterday Respiratory: A little bit tachypneic, respiratory effort shallow, lung sounds diminished bilaterally, but no rales or wheezing Abdomen: Abdomen distended, feels like ascites, somewhat diffusely tender, no mass, no guarding MSK:  Neuro: Awake and alert, moves all extremities with generalized weakness, but symmetric strength.  Speech fluent. Psych: Attention normal, affect normal, judgment insight appear normal.         Data Reviewed: I have personally reviewed following labs and imaging studies:  CBC: Recent Labs  Lab 12/05/19 1600 12/05/19 2020  12/08/19 0423 12/09/19 0950 12/10/19 0112 12/11/19 0507 12/12/19 0047  WBC 20.6*   < > 13.4* 9.7 9.5 8.9 7.3  NEUTROABS 18.5*  --   --   --   --   --   --   HGB 7.7*   < > 7.2* 7.0* 7.8* 6.9* 8.8*  HCT 25.3*   < > 24.1* 24.3* 26.6* 23.8* 28.8*  MCV 93.0   < > 96.8 100.4* 98.5 99.2 95.0  PLT 42*   < > 106* 129* PLATELET CLUMPS NOTED ON SMEAR, UNABLE TO ESTIMATE 123* 148*   < > = values in this interval not displayed.   Basic Metabolic Panel: Recent Labs  Lab 12/08/19 0423 12/09/19 0034 12/09/19 0723 12/10/19 0439 12/11/19 0507 12/12/19 0047  NA 138  --  136 132* 134* 131*  K 2.3*  --  3.6 4.4 4.7 4.5  CL 101  --  102 98 98 98  CO2 23  --  25 26 27 23   GLUCOSE 119*  --  121* 127* 131* 82  BUN 18  --  8 8 9 10   CREATININE 0.57  --  0.55 0.36* 0.39* <0.30*  CALCIUM 7.9*  --  7.7* 8.0* 7.9* 8.1*  MG 1.5*  --  1.7 1.7 1.7 1.7  PHOS 2.0* 1.8*  --  2.2* 2.4* 2.7   GFR: CrCl cannot be calculated (This lab value cannot be used to  calculate CrCl because it is not a number: <0.30). Liver Function Tests: Recent Labs  Lab 12/07/19 0405 12/11/19 0507 12/12/19 0047  AST 112*  --  319*  ALT 50*  --  79*  ALKPHOS 120  --  147*  BILITOT 1.4* 0.9 1.4*  PROT 5.2*  --  5.8*  ALBUMIN 1.7*  --  2.2*   No results for input(s): LIPASE, AMYLASE in the last 168 hours. No results for input(s): AMMONIA in the last 168 hours. Coagulation Profile: No results for input(s): INR, PROTIME in the last 168 hours. Cardiac Enzymes: Recent Labs  Lab 12/07/19 0405 12/08/19 0423 12/11/19 0507  CKTOTAL 96 85 50   BNP (last 3 results) No results for input(s): PROBNP in the last 8760 hours. HbA1C: No results for input(s): HGBA1C in the last 72 hours. CBG: Recent Labs  Lab 12/12/19 0001 12/12/19 0414 12/12/19 0516 12/12/19 0841 12/12/19 1052  GLUCAP 91 65* 119* 76 53*   Lipid Profile: No results for input(s): CHOL, HDL, LDLCALC, TRIG, CHOLHDL, LDLDIRECT in the last 72 hours. Thyroid Function Tests: No results for input(s): TSH, T4TOTAL, FREET4, T3FREE, THYROIDAB in the last 72 hours. Anemia Panel: No results for input(s): VITAMINB12, FOLATE, FERRITIN, TIBC, IRON, RETICCTPCT in the last 72 hours. Urine analysis:    Component Value Date/Time   COLORURINE AMBER (A) 11/03/2019 0818   APPEARANCEUR CLOUDY (A) 11/03/2019 0818   APPEARANCEUR Turbid 01/27/2014 1811   LABSPEC 1.017 11/03/2019 0818   LABSPEC 1.011 01/27/2014 1811   PHURINE 5.0 11/03/2019 0818   GLUCOSEU NEGATIVE 11/03/2019 0818   GLUCOSEU Negative 01/27/2014 1811   HGBUR NEGATIVE 11/03/2019 0818   BILIRUBINUR NEGATIVE 11/03/2019 0818   BILIRUBINUR Negative 01/27/2014 1811   KETONESUR NEGATIVE 11/03/2019 0818   PROTEINUR NEGATIVE 11/03/2019 0818   UROBILINOGEN 0.2 10/14/2012 2010   NITRITE NEGATIVE 11/03/2019 0818   LEUKOCYTESUR MODERATE (A) 11/03/2019 0818   LEUKOCYTESUR Negative 01/27/2014 1811   Sepsis  Labs: @LABRCNTIP (procalcitonin:4,lacticacidven:4)  ) Recent Results (from the past 240 hour(s))  Fungus Culture With Stain     Status:  None (Preliminary result)   Collection Time: 12/05/19 10:02 AM   Specimen: PATH Soft tissue  Result Value Ref Range Status   Fungus Stain Final report  Final    Comment: (NOTE) Performed At: [facility] [address] [place], [place] [id number] Jolene SchimkeNagendra Sanjai MD WU:[id number]:[phone]    Fungus (Mycology) Culture PENDING  Incomplete   Fungal Source VALVE  Final    Comment: Performed at [facility], [address] [place], [place] [address]  Aerobic/Anaerobic Culture (surgical/deep wound)     Status: None   Collection Time: 12/05/19 10:02 AM   Specimen: PATH Soft tissue  Result Value Ref Range Status   Specimen Description VALVE  Final   Special Requests A TRICUSPID VALVE VEGETATION  Final   Gram Stain   Final    ABUNDANT WBC PRESENT, PREDOMINANTLY PMN ABUNDANT GRAM POSITIVE COCCI    Culture   Final    ABUNDANT METHICILLIN RESISTANT STAPHYLOCOCCUS AUREUS NO ANAEROBES ISOLATED Performed at [facility], [address] [place], [place] [address]    Report Status 12/10/2019 FINAL  Final   Organism ID, Bacteria METHICILLIN RESISTANT STAPHYLOCOCCUS AUREUS  Final      Susceptibility   Methicillin resistant staphylococcus aureus - MIC*    CIPROFLOXACIN >=8 RESISTANT Resistant     ERYTHROMYCIN >=8 RESISTANT Resistant     GENTAMICIN <=0.5 SENSITIVE Sensitive     OXACILLIN >=4 RESISTANT Resistant     TETRACYCLINE 2 SENSITIVE Sensitive     VANCOMYCIN 1 SENSITIVE Sensitive     TRIMETH/SULFA >=320 RESISTANT Resistant     CLINDAMYCIN >=8 RESISTANT Resistant     RIFAMPIN <=0.5 SENSITIVE Sensitive     Inducible Clindamycin NEGATIVE Sensitive     * ABUNDANT METHICILLIN RESISTANT STAPHYLOCOCCUS AUREUS  Acid Fast Smear (AFB)     Status: None   Collection Time: 12/05/19 10:02 AM   Specimen: PATH Soft tissue  Result Value Ref Range Status    AFB Specimen Processing Comment  Final    Comment: Tissue Grinding and Digestion/Decontamination   Acid Fast Smear Negative  Final    Comment: (NOTE) Performed At: [facility] [address] [place], [place] [id number] Jolene SchimkeNagendra Sanjai MD MW:[id number]:[phone]    Source (AFB) VALVE  Final    Comment: Performed at [facility], [address] [place], [place] [address]  Fungus Culture Result     Status: None   Collection Time: 12/05/19 10:02 AM  Result Value Ref Range Status   Result 1 Comment  Final    Comment: (NOTE) KOH/Calcofluor preparation:  no fungus observed. Performed At: [facility] [address] [place], [place] [id number] Jolene SchimkeNagendra Sanjai MD VZ:[id number]:[phone]   Culture, blood (routine x 2)     Status: None   Collection Time: 12/06/19 11:16 AM   Specimen: BLOOD  Result Value Ref Range Status   Specimen Description BLOOD SITE NOT SPECIFIED  Final   Special Requests   Final    BOTTLES DRAWN AEROBIC AND ANAEROBIC Blood Culture adequate volume   Culture   Final    NO GROWTH 5 DAYS Performed at [facility], [address] [place], [place] [address]    Report Status 12/11/2019 FINAL  Final  Culture, blood (routine x 2)     Status: None   Collection Time: 12/06/19  5:52 PM   Specimen: BLOOD  Result Value Ref Range Status   Specimen Description BLOOD SITE NOT SPECIFIED  Final   Special Requests   Final  BOTTLES DRAWN AEROBIC AND ANAEROBIC Blood Culture adequate volume   Culture   Final    NO GROWTH 5 DAYS Performed at [facility], [address] [place], [place] [address]    Report Status 12/11/2019 FINAL  Final         Radiology Studies: DG CHEST PORT 1 VIEW  Result Date: 12/11/2019 CLINICAL DATA:  Shortness of breath EXAM: PORTABLE CHEST 1 VIEW COMPARISON:  Chest radiograph dated 12/05/2019. FINDINGS: The cardiac silhouette is unchanged. There is increased consolidation/opacity of the lower lateral left lung. Moderate patchy  bilateral airspace opacities are unchanged. A left pleural effusion likely contributes. There is no right pleural effusion. There is no pneumothorax. An enteric tube enters the stomach and terminates below the field of view. IMPRESSION: 1. Increased consolidation/opacity of the lower lateral left lung. A left pleural effusion likely contributes. 2. Unchanged moderate patchy bilateral airspace opacities. Electronically Signed   By: Romona Curls M.D.   On: 12/11/2019 14:29   DG Abd Portable 1V  Result Date: 12/11/2019 CLINICAL DATA:  Abdominal pain and distension. EXAM: PORTABLE ABDOMEN - 1 VIEW COMPARISON:  Abdominal radiograph dated 12/05/2019. FINDINGS: An enteric tube terminates in the stomach. There is a nonobstructive bowel gas pattern. No radiopaque calculi are noted overlying the urinary tract. IMPRESSION: Enteric tube terminates in the stomach. Electronically Signed   By: Romona Curls M.D.   On: 12/11/2019 14:30        Scheduled Meds: . sodium chloride   Intravenous Once  . sodium chloride   Intravenous Once  . chlorhexidine  15 mL Mouth Rinse BID  . Chlorhexidine Gluconate Cloth  6 each Topical Daily  . clonazePAM  0.5 mg Oral TID  . collagenase   Topical Daily  . mouth rinse  15 mL Mouth Rinse q12n4p  . methadone  20 mg Oral Q8H  . sodium chloride flush  10-40 mL Intracatheter Q12H  . thiamine  100 mg Oral Daily   Continuous Infusions: . sodium chloride 10 mL/hr at 12/05/19 0617  . sodium chloride    . sodium chloride Stopped (12/09/19 1242)  . DAPTOmycin (CUBICIN)  IV 500 mg (12/11/19 2006)  . feeding supplement (JEVITY 1.2 CAL) Stopped (12/11/19 1300)     LOS: 12 days    Time spent: 35 minutes    Alberteen Sam, MD [facility] 12/12/2019, 11:03 AM     Please page though AMION or Epic secure chat:  For [employer], Higher education careers adviser

## 2019-12-12 NOTE — Progress Notes (Signed)
Hypoglycemic Event  CBG: 65 @ 0414  Treatment: Dextrose 50% solution  Symptoms: Asymptomatic  Follow-up CBG: Time: 0516 CBG Result:119  Possible Reasons for Event: Patient not eating and drinking well  Comments/MD notified: Patient asymptomatic. Encouraged patient to drink juice and gave some jello but patient is non compliant. Encouraged the patient to eat her breakfast this morning.     Jodean Lima

## 2019-12-12 NOTE — Consult Note (Signed)
WOC Nurse wound follow up Wound type: IV infiltration site Discussed patient with Dr. Maryfrances Bunnell this am, updated POC. Added enzymatic debridement ointment. Will add to [facility] team follow up list.   Dressing procedure/placement/frequency: Apply 1/4" thick layer of Santyl to wound bed of the left forearm, top with saline moist gauze. Top with dry dressing. Secure with kerlix. Change daily.  Monitor for evolution and need for possible more aggressive debridement.  WOC Nurse team will follow with you and see patient within 10 days for wound assessments.  Please notify WOC nurses of any acute changes in the wounds or any new areas of concern Gabriela Giannelli [facility] MSN, RN,CWOCN, CNS, CWON-AP [phone]

## 2019-12-12 NOTE — Progress Notes (Signed)
Physical Therapy Treatment Patient Details Name: Amber [name] MRN: [id number] DOB: Jul 23, 1990 Today's Date: 12/12/2019    History of Present Illness 29 yo admitted 11/17 with septic shock due to MRSA endocarditis with IVDU fentanyl and heroin day of admission. PMhx: admission 10/23-10/26 for endocarditis left AMA, IVDU, cellulitis, depression    PT Comments    Pt supine in bed on arrival.  Pt continues to require external assistance to mobilize.  Plan next session for progression of gt distance and increased activity tolerance.  Pt with complaints of pain on R side of sacrum that is very tender to the touch.  Informed nursing and requested pressure relief cushion.  Educated and informed patient to move from side to side to relieve pressure.      Follow Up Recommendations  Home health PT;Supervision/Assistance - 24 hour     Equipment Recommendations  Rolling walker with 5" wheels;3in1 (PT)    Recommendations for Other Services       Precautions / Restrictions Precautions Precautions: Fall Precaution Comments: cortrak Restrictions Weight Bearing Restrictions: No    Mobility  Bed Mobility Overal bed mobility: Needs Assistance Bed Mobility: Supine to Sit;Sit to Supine     Supine to sit: Min assist;HOB elevated Sit to supine: Min assist   General bed mobility comments: Pt required assistance for trunk elevation to move to edge of bed and for lift B LEs back to bed against gravity.  Transfers Overall transfer level: Needs assistance Equipment used: Rolling walker (2 wheeled) Transfers: Sit to/from Stand Sit to Stand: Min assist         General transfer comment: Cues for hand placement to and from seated surface. Pt reaching for hand grips on RW to pull into standing required cues to push.  Ambulation/Gait Ambulation/Gait assistance: Min assist Gait Distance (Feet): 20 Feet Assistive device: Rolling walker (2 wheeled) Gait Pattern/deviations: Step-through  pattern;Decreased stride length;Shuffle;Trunk flexed     General Gait Details: Cues for posture upper trunk control.  Pt guarded due to pain in R side of sacrum at bony prominence. Heavy use of RW due to pain.   Stairs             Wheelchair Mobility    Modified Rankin (Stroke Patients Only)       Balance Overall balance assessment: Needs assistance Sitting-balance support: Feet supported;No upper extremity supported Sitting balance-[name]: Fair       Standing balance-[name]: Poor Standing balance comment: single and bil UE support in standing with need for RW to step                            Cognition Arousal/Alertness: Awake/alert Behavior During Therapy: Anxious Overall Cognitive Status: Impaired/Different from baseline Area of Impairment: Orientation                 Orientation Level: Time (able to state month correctly but not day of the week.)                    Exercises      General Comments        Pertinent Vitals/Pain Pain Assessment: 0-10 Pain Score: 7  Pain Location: right low back and bony prominence on her bottom ( r side as well) Pain Descriptors / Indicators: Aching;Guarding;Stabbing Pain Intervention(s): Monitored during session;Repositioned    Home Living  Prior Function            PT Goals (current goals can now be found in the care plan section) Acute Rehab PT Goals Patient Stated Goal: To eat my lunch Potential to Achieve Goals: Fair Progress towards PT goals: Progressing toward goals    Frequency    Min 3X/week      PT Plan Current plan remains appropriate    Co-evaluation              AM-PAC PT "6 Clicks" Mobility   Outcome Measure  Help needed turning from your back to your side while in a flat bed without using bedrails?: A Little Help needed moving from lying on your back to sitting on the side of a flat bed without using bedrails?: A  Little Help needed moving to and from a bed to a chair (including a wheelchair)?: A Little Help needed standing up from a chair using your arms (e.g., wheelchair or bedside chair)?: A Little Help needed to walk in hospital room?: A Little Help needed climbing 3-5 steps with a railing? : A Little 6 Click Score: 18    End of Session Equipment Utilized During Treatment: Oxygen Activity Tolerance: Patient limited by pain Patient left: in chair;with call bell/phone within reach;with chair alarm set Nurse Communication: Mobility status PT Visit Diagnosis: Other abnormalities of gait and mobility (R26.89);Muscle weakness (generalized) (M62.81);Pain     Time: 1531-1556 PT Time Calculation (min) (ACUTE ONLY): 25 min  Charges:  $Gait Training: 8-22 mins $Therapeutic Activity: 8-22 mins                     Bonney Leitz , PTA Acute Rehabilitation Services Pager [phone] Office [phone]     Junie Engram Artis Delay 12/12/2019, 4:14 PM

## 2019-12-12 NOTE — Progress Notes (Signed)
Brief Nutrition Note  Received page from MD regarding pt's current TF regimen and diet order. Pt has been on clear liquid diet since 11/24 without diet advancement. MD to advance diet to Soft and plan is to start 24-hour calorie count. Discussed with RN. RD will d/c Jevity tube feeding orders. If pt unable to meet nutritional needs via PO intake, would recommend restarting tube feeds with fiber-free formula like Osmolite.  RD will follow up tomorrow, 12/13/19, and provide results of calorie count. RD to order Ensure Enlive TID to aid pt in meeting kcal and protein needs.   Mertie Clause, MS, RD, LDN Inpatient Clinical Dietitian Please see AMiON for contact information.

## 2019-12-12 NOTE — Progress Notes (Signed)
Echocardiogram 2D Echocardiogram has been performed.  Warren Lacy Yanelly Cantrelle 12/12/2019, 12:05 PM

## 2019-12-12 NOTE — Progress Notes (Signed)
Hypoglycemic Event  CBG: 54 @ 2028 11/28  Treatment: 8 oz juice  Symptoms: Asymptomatic  Follow-up CBG: Time: @2156  and @0001  CBG Result:63 and 91  Possible Reasons for Event: Tube feeding stopped  Comments/MD notified:Patient asymptomatic   Patient found to have CBG 63f 54 @2028 . Gave 8 oz of juice to drink but patient took an hour to finish half of the juice. CBG rechecked and was 63. Patient encouraged to finish the juice. CBG @0001  91. Will encourage the patient to eat frequently.     

## 2019-12-12 NOTE — Progress Notes (Signed)
[facility]  Date of Admission:  11/30/2019     Total days of antibiotics: 12         Current antibiotics: Daptomycin   Reason for visit: Follow up on MRSA bacteremia   ASSESSMENT AND PLAN:   Amber [name] is a 29 y.o. 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  S/p angiovac procedure 11/22 and extubated 11/24.  # MRSA Bacteremia with septic pulmonary emboli complicated by TV IE s/p Angiovac 11/22 # Fevers # LFT elevation # IV infiltration/left arm wound   -- continue daptomycin -- weekly CK (last done 11/28: 50) -- cultures cleared 11/23 -- RUQ and HCV RNA pending to eval LFTs -- consider advance diet/remove NG if able -- wean O2 -- wound care managing arm wound.  May need debridement but likely not necessary right now   SUBJECTIVE:   Breathing okay without SOB.  Coughing but thinks may be related to NG tube and/or PND.  No fevers, chills.  Generally weak but no focal deficits.   Review of Systems: As noted above.  All other systems reviewed and are negative.   OBJECTIVE:   Allergies  Allergen Reactions  . Bee Venom Anaphylaxis    Blood pressure 118/86, pulse (!) 104, temperature 99.4 F (37.4 C), temperature source Oral, resp. rate (!) 22, height 5\' 2"  (1.575 m), weight 51.5 kg, SpO2 93 %. Body mass index is 20.77 kg/m.  Physical Exam Chronically ill appearing, thin, no distress; alert, able to converse HEENT: NCAT Neck supple Heart: RRR, systolic murmur Skin: multiple track marks upper extremities.  See picture of eschar left arm MSK: pre tibial edema.  Able to move knees and ankles without pain Neuro: alert and awake.  She is able to follows commands, moving extremities without obvious assymetry       Lab Results & Microbiology Lab Results  Component Value Date   WBC 7.3 12/12/2019   HGB 8.8 (L) 12/12/2019   HCT 28.8 (L) 12/12/2019   MCV 95.0  12/12/2019   PLT 148 (L) 12/12/2019    Lab Results  Component Value Date   NA 131 (L) 12/12/2019   K 4.5 12/12/2019   CO2 23 12/12/2019   GLUCOSE 82 12/12/2019   BUN 10 12/12/2019   CREATININE <0.30 (L) 12/12/2019   CALCIUM 8.1 (L) 12/12/2019   GFRNONAA NOT CALCULATED 12/12/2019   GFRAA >60 01/10/2019    Lab Results  Component Value Date   ALT 79 (H) 12/12/2019   AST 319 (H) 12/12/2019   ALKPHOS 147 (H) 12/12/2019   BILITOT 1.4 (H) 12/12/2019     I have reviewed the micro and lab results in Epic.  Imaging DG CHEST PORT 1 VIEW  Result Date: 12/11/2019 CLINICAL DATA:  Shortness of breath EXAM: PORTABLE CHEST 1 VIEW COMPARISON:  Chest radiograph dated 12/05/2019. FINDINGS: The cardiac silhouette is unchanged. There is increased consolidation/opacity of the lower lateral left lung. Moderate patchy bilateral airspace opacities are unchanged. A left pleural effusion likely contributes. There is no right pleural effusion. There is no pneumothorax. An enteric tube enters the stomach and terminates below the field of view. IMPRESSION: 1. Increased consolidation/opacity of the lower lateral left lung. A left pleural effusion likely contributes. 2. Unchanged moderate patchy bilateral airspace opacities. Electronically Signed   By: 12/07/2019 M.D.   On: 12/11/2019 14:29   DG Abd Portable 1V  Result Date: 12/11/2019 CLINICAL DATA:  Abdominal pain and distension. EXAM: PORTABLE ABDOMEN - 1 VIEW COMPARISON:  Abdominal radiograph dated 12/05/2019. FINDINGS: An enteric tube terminates in the stomach. There is a nonobstructive bowel gas pattern. No radiopaque calculi are noted overlying the urinary tract. IMPRESSION: Enteric tube terminates in the stomach. Electronically Signed   By: Romona Curls M.D.   On: 12/11/2019 14:30   ECHOCARDIOGRAM LIMITED  Result Date: 12/12/2019    ECHOCARDIOGRAM LIMITED REPORT   Patient Name:   Amber [name] Date of Exam: 12/12/2019 Medical Rec #:  [id number]         Height:       62.0 in Accession #:    [id number]       Weight:       113.5 lb Date of Birth:  08-25-1990         BSA:          1.503 m Patient Age:    29 years         BP:           139/100 mmHg Patient Gender: F                HR:           100 bpm. Exam Location:  Inpatient Procedure: Limited Echo, Color Doppler and Cardiac Doppler Indications:    I36.9 Nonrheumatic tricuspid valve disorder, unspecified  History:        Patient has prior history of Echocardiogram examinations, most                 recent 12/05/2019. Risk Factors:IVDU. Angiovac debulking of                 tricuspid valve endocarditis 12/05/19.  Sonographer:    Irving Burton Senior RDCS Referring Phys: [id number] CHRISTOPHER P DANFORD  Sonographer Comments: Limited to evaluate TR and EF IMPRESSIONS  1. Left ventricular ejection fraction, by estimation, is 55 to 60%. The left ventricle has normal function.  2. Right ventricular systolic function is mildly reduced. The right ventricular size is moderately enlarged. There is normal pulmonary artery systolic pressure. The estimated right ventricular systolic pressure is 28.8 mmHg  3. A small pericardial effusion is present.  4. The inferior vena cava is normal in size with <50% respiratory variability, suggesting right atrial pressure of 8 mmHg.  5. Compared to prior echo, large vegetation on septal leaflet of tricuspid valve is no longer seen. However there remains poor coaptation of tricuspid valve leaflets. Tricuspid valve regurgitation is severe. FINDINGS  Left Ventricle: Left ventricular ejection fraction, by estimation, is 55 to 60%. The left ventricle has normal function. Right Ventricle: The right ventricular size is moderately enlarged. Right ventricular systolic function is mildly reduced. There is normal pulmonary artery systolic pressure. The tricuspid regurgitant velocity is 2.28 m/s, and with an assumed right atrial pressure of 8 mmHg, the estimated right ventricular systolic pressure is 28.8  mmHg. Pericardium: A small pericardial effusion is present. Mitral Valve: The mitral valve is normal in structure. Tricuspid Valve: The tricuspid valve is abnormal. Tricuspid valve regurgitation is severe. Aortic Valve: Aortic valve regurgitation is not visualized. Pulmonic Valve: The pulmonic valve was not well visualized. Pulmonic valve regurgitation is not visualized. Venous: The inferior vena cava is normal in size with less than 50% respiratory variability, suggesting right atrial pressure of 8 mmHg. TRICUSPID VALVE TR Peak grad:   20.8 mmHg TR Vmax:        228.00 cm/s Epifanio Lesches MD Electronically signed  by Epifanio Lesches MD Signature Date/Time: 12/12/2019/5:25:31 PM    Final        Vedia Coffer for Infectious [facility] [phone] pager 12/12/2019, 5:25 PM

## 2019-12-13 ENCOUNTER — Inpatient Hospital Stay: Payer: Self-pay

## 2019-12-13 DIAGNOSIS — J9601 Acute respiratory failure with hypoxia: Secondary | ICD-10-CM

## 2019-12-13 LAB — CBC
HCT: 31.7 % — ABNORMAL LOW (ref 36.0–46.0)
Hemoglobin: 9.4 g/dL — ABNORMAL LOW (ref 12.0–15.0)
MCH: 28.7 pg (ref 26.0–34.0)
MCHC: 29.7 g/dL — ABNORMAL LOW (ref 30.0–36.0)
MCV: 96.6 fL (ref 80.0–100.0)
Platelets: 166 10*3/uL (ref 150–400)
RBC: 3.28 MIL/uL — ABNORMAL LOW (ref 3.87–5.11)
RDW: 22.5 % — ABNORMAL HIGH (ref 11.5–15.5)
WBC: 7.5 10*3/uL (ref 4.0–10.5)
nRBC: 0 % (ref 0.0–0.2)

## 2019-12-13 LAB — COMPREHENSIVE METABOLIC PANEL
ALT: 87 U/L — ABNORMAL HIGH (ref 0–44)
AST: 305 U/L — ABNORMAL HIGH (ref 15–41)
Albumin: 2.3 g/dL — ABNORMAL LOW (ref 3.5–5.0)
Alkaline Phosphatase: 226 U/L — ABNORMAL HIGH (ref 38–126)
Anion gap: 11 (ref 5–15)
BUN: 7 mg/dL (ref 6–20)
CO2: 27 mmol/L (ref 22–32)
Calcium: 8.3 mg/dL — ABNORMAL LOW (ref 8.9–10.3)
Chloride: 94 mmol/L — ABNORMAL LOW (ref 98–111)
Creatinine, Ser: 0.49 mg/dL (ref 0.44–1.00)
GFR, Estimated: >60 mL/min (ref >60)
Glucose, Bld: 169 mg/dL — ABNORMAL HIGH (ref 70–99)
Potassium: 3.9 mmol/L (ref 3.5–5.1)
Sodium: 132 mmol/L — ABNORMAL LOW (ref 135–145)
Total Bilirubin: 1.3 mg/dL — ABNORMAL HIGH (ref 0.3–1.2)
Total Protein: 6.3 g/dL — ABNORMAL LOW (ref 6.5–8.1)

## 2019-12-13 LAB — GLUCOSE, CAPILLARY
Glucose-Capillary: 113 mg/dL — ABNORMAL HIGH (ref 70–99)
Glucose-Capillary: 141 mg/dL — ABNORMAL HIGH (ref 70–99)
Glucose-Capillary: 156 mg/dL — ABNORMAL HIGH (ref 70–99)
Glucose-Capillary: 162 mg/dL — ABNORMAL HIGH (ref 70–99)
Glucose-Capillary: 190 mg/dL — ABNORMAL HIGH (ref 70–99)
Glucose-Capillary: 60 mg/dL — ABNORMAL LOW (ref 70–99)
Glucose-Capillary: 80 mg/dL (ref 70–99)
Glucose-Capillary: 93 mg/dL (ref 70–99)

## 2019-12-13 LAB — PHOSPHORUS: Phosphorus: 2.8 mg/dL (ref 2.5–4.6)

## 2019-12-13 MED ORDER — MAGNESIUM SULFATE 2 GM/50ML IV SOLN
2.0000 g | Freq: Once | INTRAVENOUS | Status: AC
  Administered 2019-12-13: 2 g via INTRAVENOUS
  Filled 2019-12-13: qty 50

## 2019-12-13 MED ORDER — OSMOLITE 1.5 CAL PO LIQD
600.0000 mL | ORAL | Status: DC
  Administered 2019-12-13 – 2019-12-19 (×8): 600 mL
  Filled 2019-12-13: qty 1000
  Filled 2019-12-13: qty 711

## 2019-12-13 MED ORDER — SODIUM CHLORIDE 0.9% FLUSH
10.0000 mL | Freq: Two times a day (BID) | INTRAVENOUS | Status: DC
  Administered 2019-12-13 – 2020-01-04 (×35): 10 mL
  Administered 2020-01-05: 21:00:00 20 mL
  Administered 2020-01-06 – 2020-01-16 (×17): 10 mL

## 2019-12-13 MED ORDER — DEXTROSE 50 % IV SOLN
INTRAVENOUS | Status: AC
  Administered 2019-12-13: 25 mL
  Filled 2019-12-13: qty 50

## 2019-12-13 MED ORDER — SODIUM CHLORIDE 0.9% FLUSH
10.0000 mL | INTRAVENOUS | Status: DC | PRN
  Administered 2019-12-18 – 2020-01-03 (×3): 10 mL

## 2019-12-13 MED ORDER — DOXYCYCLINE HYCLATE 100 MG PO TABS
100.0000 mg | ORAL_TABLET | Freq: Two times a day (BID) | ORAL | Status: DC
  Administered 2019-12-13 – 2020-01-16 (×67): 100 mg via ORAL
  Filled 2019-12-13 (×68): qty 1

## 2019-12-13 NOTE — Progress Notes (Signed)
Peripherally Inserted Central Catheter Placement  The IV Nurse has discussed with the patient and/or persons authorized to consent for the patient, the purpose of this procedure and the potential benefits and risks involved with this procedure.  The benefits include less needle sticks, lab draws from the catheter, and the patient may be discharged home with the catheter. Risks include, but not limited to, infection, bleeding, blood clot (thrombus formation), and puncture of an artery; nerve damage and irregular heartbeat and possibility to perform a PICC exchange if needed/ordered by physician.  Alternatives to this procedure were also discussed.  Bard Power PICC patient education guide, fact sheet on infection prevention and patient information card has been provided to patient /or left at bedside.    PICC Placement Documentation  PICC Single Lumen 12/13/19 PICC Right Brachial 33 cm 0 cm (Active)  Indication for Insertion or Continuance of Line Prolonged intravenous therapies 12/13/19 1659  Exposed Catheter (cm) 0 cm 12/13/19 1659  Site Assessment Clean;Dry;Intact 12/13/19 1659  Line Status Flushed;Saline locked;Blood return noted 12/13/19 1659  Dressing Type Transparent 12/13/19 1659  Dressing Status Clean;Dry;Intact 12/13/19 1659  Antimicrobial disc in place? Yes 12/13/19 1659  Dressing Intervention New dressing 12/13/19 1659  Dressing Change Due 12/20/19 12/13/19 1659       Ethelda Chick 12/13/2019, 5:00 PM

## 2019-12-13 NOTE — Progress Notes (Signed)
[facility]  Date of Admission:  11/30/2019     Total days of antibiotics: 13         Current antibiotics: Daptomycin   Reason for visit: Follow up on MRSA bacteremia   ASSESSMENT AND PLAN:   Amber [name] is a 29 y.o. 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  S/p angiovac procedure 11/22 and extubated 11/24.  # MRSA Bacteremia with septic pulmonary emboli complicated by TV IE s/p Angiovac 11/22 with severe TR # LFT elevation # IV infiltration/left arm wound  -- continue daptomycin -- I think that daptomycin alone is okay since septic emboli are endovascular source, however, continues to have cough and hypoxia so will add doxycycline as well today -- weekly CK (last done 11/28: 50) -- cultures cleared 11/23 -- RUQ with sludge but no complicating factors.  Mild ascites. HCV RNA pending to eval LFTs which continue to rise.  Consider ? Cardiac etiology of hepatic congestion from severe TR -- consider advance diet/remove NG if able -- wean O2 -- wound care managing arm wound.  May need debridement but likely not necessary right now   SUBJECTIVE:   Continues to have some cough, no fevers, chills.  No abd pain. Trying to eat and drink more.   Endorses some right hip pain with walking.  Review of Systems: As noted above.  All other systems reviewed and are negative.   OBJECTIVE:   Allergies  Allergen Reactions  . Bee Venom Anaphylaxis    Blood pressure (!) 128/99, pulse (!) 108, temperature 99.2 F (37.3 C), temperature source Oral, resp. rate 20, height 5\' 2"  (1.575 m), weight 49.9 kg, SpO2 100 %. Body mass index is 20.12 kg/m.  Physical Exam Chronically ill appearing, thin, no distress; alert, able to converse HEENT: NCAT Neck supple Skin: multiple track marks upper extremities.  See picture of eschar left arm MSK: trace tibial edema.  Able to move knees and ankles  without pain Neuro: alert and awake.  She is able to follows commands, moving extremities without obvious assymetry  12/12/19      Lab Results & Microbiology Lab Results  Component Value Date   WBC 7.5 12/13/2019   HGB 9.4 (L) 12/13/2019   HCT 31.7 (L) 12/13/2019   MCV 96.6 12/13/2019   PLT 166 12/13/2019    Lab Results  Component Value Date   NA 132 (L) 12/13/2019   K 3.9 12/13/2019   CO2 27 12/13/2019   GLUCOSE 169 (H) 12/13/2019   BUN 7 12/13/2019   CREATININE 0.49 12/13/2019   CALCIUM 8.3 (L) 12/13/2019   GFRNONAA >60 12/13/2019   GFRAA >60 01/10/2019    Lab Results  Component Value Date   ALT 87 (H) 12/13/2019   AST 305 (H) 12/13/2019   ALKPHOS 226 (H) 12/13/2019   BILITOT 1.3 (H) 12/13/2019     I have reviewed the micro and lab results in Epic.  Imaging 12/15/2019 Abdomen Complete  Result Date: 12/13/2019 CLINICAL DATA:  Elevated LFTs EXAM: ABDOMEN ULTRASOUND COMPLETE COMPARISON:  None. FINDINGS: Gallbladder: Gallbladder is well distended with tumefactive sludge identified. No wall thickening or pericholecystic fluid is noted. No gallstones are seen. Common bile duct: Diameter: 4.2 mm. Liver: No focal lesion identified. Within normal limits in parenchymal echogenicity. Portal vein is patent on color Doppler imaging with normal direction of blood flow towards the liver. IVC: No abnormality visualized. Pancreas: Visualized portion  unremarkable. Spleen: Spleen is elongated but demonstrates its normal spleniform shape. Calculated volume is within normal limits. Right Kidney: Length: 12.2 cm. Echogenicity within normal limits. No mass or hydronephrosis visualized. Left Kidney: Length: 10.6 cm. Echogenicity within normal limits. No mass or hydronephrosis visualized. Abdominal aorta: No aneurysm visualized. Other findings: Mild ascites is noted. Small peripancreatic lymph nodes are noted but not significant by size criteria IMPRESSION: Gallbladder sludge without complicating  factors. Elongated spleen with normal shape and normal volume. Mild ascites. Electronically Signed   By: Alcide Clever M.D.   On: 12/13/2019 00:07   DG CHEST PORT 1 VIEW  Result Date: 12/11/2019 CLINICAL DATA:  Shortness of breath EXAM: PORTABLE CHEST 1 VIEW COMPARISON:  Chest radiograph dated 12/05/2019. FINDINGS: The cardiac silhouette is unchanged. There is increased consolidation/opacity of the lower lateral left lung. Moderate patchy bilateral airspace opacities are unchanged. A left pleural effusion likely contributes. There is no right pleural effusion. There is no pneumothorax. An enteric tube enters the stomach and terminates below the field of view. IMPRESSION: 1. Increased consolidation/opacity of the lower lateral left lung. A left pleural effusion likely contributes. 2. Unchanged moderate patchy bilateral airspace opacities. Electronically Signed   By: Romona Curls M.D.   On: 12/11/2019 14:29   DG Abd Portable 1V  Result Date: 12/11/2019 CLINICAL DATA:  Abdominal pain and distension. EXAM: PORTABLE ABDOMEN - 1 VIEW COMPARISON:  Abdominal radiograph dated 12/05/2019. FINDINGS: An enteric tube terminates in the stomach. There is a nonobstructive bowel gas pattern. No radiopaque calculi are noted overlying the urinary tract. IMPRESSION: Enteric tube terminates in the stomach. Electronically Signed   By: Romona Curls M.D.   On: 12/11/2019 14:30   ECHOCARDIOGRAM LIMITED  Result Date: 12/12/2019    ECHOCARDIOGRAM LIMITED REPORT   Patient Name:   Amber [name] Date of Exam: 12/12/2019 Medical Rec #:  [id number]        Height:       62.0 in Accession #:    [id number]       Weight:       113.5 lb Date of Birth:  01-12-1991         BSA:          1.503 m Patient Age:    29 years         BP:           139/100 mmHg Patient Gender: F                HR:           100 bpm. Exam Location:  Inpatient Procedure: Limited Echo, Color Doppler and Cardiac Doppler Indications:    I36.9 Nonrheumatic tricuspid  valve disorder, unspecified  History:        Patient has prior history of Echocardiogram examinations, most                 recent 12/05/2019. Risk Factors:IVDU. Angiovac debulking of                 tricuspid valve endocarditis 12/05/19.  Sonographer:    Irving Burton Senior RDCS Referring Phys: [id number] CHRISTOPHER P DANFORD  Sonographer Comments: Limited to evaluate TR and EF IMPRESSIONS  1. Left ventricular ejection fraction, by estimation, is 55 to 60%. The left ventricle has normal function.  2. Right ventricular systolic function is mildly reduced. The right ventricular size is moderately enlarged. There is normal pulmonary artery systolic pressure. The estimated right ventricular systolic pressure is 28.8 mmHg  3. A small pericardial effusion is present.  4. The inferior vena cava is normal in size with <50% respiratory variability, suggesting right atrial pressure of 8 mmHg.  5. Compared to prior echo, large vegetation on septal leaflet of tricuspid valve is no longer seen. However there remains poor coaptation of tricuspid valve leaflets. Tricuspid valve regurgitation is severe. FINDINGS  Left Ventricle: Left ventricular ejection fraction, by estimation, is 55 to 60%. The left ventricle has normal function. Right Ventricle: The right ventricular size is moderately enlarged. Right ventricular systolic function is mildly reduced. There is normal pulmonary artery systolic pressure. The tricuspid regurgitant velocity is 2.28 m/s, and with an assumed right atrial pressure of 8 mmHg, the estimated right ventricular systolic pressure is 28.8 mmHg. Pericardium: A small pericardial effusion is present. Mitral Valve: The mitral valve is normal in structure. Tricuspid Valve: The tricuspid valve is abnormal. Tricuspid valve regurgitation is severe. Aortic Valve: Aortic valve regurgitation is not visualized. Pulmonic Valve: The pulmonic valve was not well visualized. Pulmonic valve regurgitation is not visualized. Venous: The  inferior vena cava is normal in size with less than 50% respiratory variability, suggesting right atrial pressure of 8 mmHg. TRICUSPID VALVE TR Peak grad:   20.8 mmHg TR Vmax:        228.00 cm/s Epifanio Lesches MD Electronically signed by Epifanio Lesches MD Signature Date/Time: 12/12/2019/5:25:31 PM    Final        Vedia Coffer for Infectious Disease [facility] [phone] pager 12/13/2019, 12:11 PM

## 2019-12-13 NOTE — Progress Notes (Signed)
Daily Progress Note   Patient Name: Amber [name]       Date: 12/13/2019 DOB: 1990/04/16  Age: 29 y.o. MRN#: [id number] Attending Physician: Alberteen Sam, * Primary Care Physician: Patient, No Pcp Per Admit Date: 11/30/2019  Reason for Consultation/Follow-up: Other Family had concerns/questions around IV site wound and requested to speak with PMT  Subjective: Chart review performed. Received report from primary RN - no acute concerns.  Went to visit patient at bedside - no family/visitors present. Patient was lying in bed - lethargic but did wake to voice/gentle touch - was falling asleep during conversation. No signs or non-verbal gestures of pain or discomfort noted. No respiratory distress, increased work of breathing, or secretions noted. Patient denied pain. Dressage was in place to left arm wound - c/d/i.   Returned Amber [name]/aunt's phone call per her request. Amber [name] expressed concerns over the patient's arm and wanted to know what interventions were implemented to care for the wound. Amber [name] explained that she was able to speak with the wound care nurse yesterday who answered most of her questions - reviewed and confirmed information. Reviewed that dressing was still in place today and was c/d/i. Amber [name] had no further questions and expressed appreciation for return phone call today.  All questions and concerns addressed. Encouraged to call with questions and/or concerns.   Length of Stay: 13  Current Medications: Scheduled Meds:  . sodium chloride   Intravenous Once  . sodium chloride   Intravenous Once  . chlorhexidine  15 mL Mouth Rinse BID  . Chlorhexidine Gluconate Cloth  6 each Topical Daily  . clonazePAM  0.5 mg Oral BID  . collagenase   Topical Daily  .  doxycycline  100 mg Oral Q12H  . feeding supplement  237 mL Oral TID BM  . mouth rinse  15 mL Mouth Rinse q12n4p  . methadone  20 mg Oral Q8H  . sodium chloride flush  10-40 mL Intracatheter Q12H  . thiamine  100 mg Oral Daily    Continuous Infusions: . sodium chloride 10 mL/hr at 12/05/19 0617  . sodium chloride    . sodium chloride Stopped (12/13/19 0140)  . DAPTOmycin (CUBICIN)  IV Stopped (12/12/19 2124)  . magnesium sulfate bolus IVPB 2 g (12/13/19 1103)    PRN Meds: Place/Maintain arterial line **AND** sodium  chloride, acetaminophen, docusate sodium, HYDROcodone-homatropine, ondansetron (ZOFRAN) IV, phenol, polyethylene glycol, sodium chloride flush  Physical Exam Vitals and nursing note reviewed.  Constitutional:      General: She is not in acute distress.    Appearance: She is cachectic. She is ill-appearing.  Pulmonary:     Effort: No respiratory distress.  Skin:    General: Skin is warm and dry.     Findings: Wound present.  Neurological:     Mental Status: She is lethargic.     Motor: Weakness present.  Psychiatric:        Attention and Perception: Attention normal.        Behavior: Behavior is cooperative.        Cognition and Memory: Cognition and memory normal.             Vital Signs: BP (!) 128/99 (BP Location: Right Leg)   Pulse (!) 108   Temp 99.2 F (37.3 C) (Oral)   Resp 20   Ht 5\' 2"  (1.575 m)   Wt 49.9 kg   LMP  (LMP Unknown)   SpO2 100%   BMI 20.12 kg/m  SpO2: SpO2: 100 % O2 Device: O2 Device: Nasal Cannula O2 Flow Rate: O2 Flow Rate (L/min): 2 L/min  Intake/output summary:   Intake/Output Summary (Last 24 hours) at 12/13/2019 1149 Last data filed at 12/13/2019 0800 Gross per 24 hour  Intake 1346.42 ml  Output 550 ml  Net 796.42 ml   LBM: Last BM Date: 12/12/19 Baseline Weight: Weight: 44 kg Most recent weight: Weight: 49.9 kg       Palliative Assessment/Data: PPS 40%    Flowsheet Rows     Most Recent Value  Intake Tab   Referral Department Critical care  Unit at Time of Referral ICU  Palliative Care Primary Diagnosis Sepsis/Infectious Disease  Date Notified 12/01/19  Palliative Care Type New Palliative care  Reason for referral Clarify Goals of Care  Date of Admission 11/30/19  Date first seen by Palliative Care 12/02/19  # of days Palliative referral response time 1 Day(s)  # of days IP prior to Palliative referral 1  Clinical Assessment  Psychosocial & Spiritual Assessment  Palliative Care Outcomes      Patient Active Problem List   Diagnosis Date Noted  . MRSA infection 12/06/2019  . Opioid use with withdrawal (HCC)   . Endocarditis of tricuspid valve   . Palliative care encounter   . Septic shock (HCC) 11/30/2019  . Septic embolism (HCC) 11/05/2019  . AKI (acute kidney injury) (HCC) 11/05/2019  . Hyponatremia 11/05/2019  . Cellulitis of left foot 11/05/2019  . 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 Bacterial endocarditis 11/05/2019  . Acute bacterial endocarditis   . MRSA bacteremia   . Sepsis (HCC) 11/03/2019  . Abscess of face 08/20/2018  . Facial cellulitis 11/21/2017  . Depression   . IV drug abuse (HCC)   . Suicidal ideation     Palliative Care Assessment & Plan   Patient Profile: 29 y.o.femalewith past medical history of heroin use, MRSA bacteremia, tricuspid endocarditis with pulmonary septic emboliwho left AMA twice during the week of October 25 , 2021was admitted on 11/17/2021with worsening weakness. She was found to be in septic shock with acute kidney injury, and severe thrombocytopenia.Patient's tricuspid valve vegetation has worsened since her 10/22 echo. 11/17 echo showed greater than 1 cm vegetation on the tricuspid valve, with a slightly smaller anterior leaflet vegetation. There is lack of coaptation and severe regurgitation.Tricuspid valve regurgitation  is severe. As patient has an on-going addiction problem she is not currently a candidate for valve  replacement surgery.  Assessment: MRSA bacteremia Tricuspid valve endocarditis s/p angiovac Septic emboli Septic shock Postoperative hypercarbic respiratory failure Anemia Thrombocytopenia IVDU, opioid dependence Severe protein calorie malnutrition  Recommendations/Plan: Continue current medical treatment - full scope Continue full code status as previously documented Updated aunt on plan of care for left arm wound - answered all questions PMT will continue to follow peripherally. If there are any imminent needs please call the service directly  Goals of Care and Additional Recommendations: Limitations on Scope of Treatment: Full Scope Treatment  Code Status:    Code Status Orders  (From admission, onward)         Start     Ordered   11/30/19 1414  Full code  Continuous        11/30/19 1416        Code Status History    Date Active Date Inactive Code Status Order ID Comments User Context   11/05/2019 2209 11/07/2019 2135 Full Code [id number]  Synetta Fail, MD ED   11/03/2019 1526 11/05/2019 1934 Full Code [id number]  Teddy Spike, DO Inpatient   11/21/2017 0329 11/24/2017 2316 Full Code [id number]  Briscoe Deutscher, MD ED   09/29/2015 1359 09/30/2015 1943 Full Code [id number]  Samuel Jester, DO ED   Advance Care Planning Activity      Prognosis:  Unable to determine, guarded  Discharge Planning: To Be Determined  Care plan was discussed with patient, aunt/Amber [name]  Thank you for allowing the Palliative Medicine Team to assist in the care of this patient.   Total Time 15 minutes Prolonged Time Billed  no       Greater than 50%  of this time was spent counseling and coordinating care related to the above assessment and plan.  Haskel Khan, NP  Please contact Palliative Medicine Team phone at [phone] for questions and concerns.

## 2019-12-13 NOTE — TOC Initial Note (Signed)
Transition of Care [facility]) - Initial/Assessment Note    Patient Details  Name: Amber [name] MRN: [id number] Date of Birth: 1990-04-05  Transition of Care [facility] CM/SW Contact:    [address], [place] Phone Number: 12/13/2019, 3:40 PM  Clinical Narrative:                 CSW met with pt at bedside in reference to possible dc plan. Pt polite but very lethargic and at times pt's eyes would roll in the back of her head. CSW tried to let pt sleep and advised pt CSW would return in the morning but pt insisted on CSW staying. CSW discussed methadone clinics here in [place], provided contact information for both methadone clinics. Pt states she has a huge support system and plans on going home to her mom when she is able to dc. Pt further states that she wants to be clean. Pt was polite while speaking with CSW and thanked CSW for her time. CSW will continue to be available if pt needs anything else.   Expected Discharge Plan: Home/Self Care Barriers to Discharge: Continued Medical Work up   Patient Goals and CMS Choice        Expected Discharge Plan and Services Expected Discharge Plan: Home/Self Care   Discharge Planning Services: CM Consult                                          Prior Living Arrangements/Services   Lives with:: Friends Patient language and need for interpreter reviewed:: Yes        Need for Family Participation in Patient Care: Yes (Comment) Care giver support system in place?: Yes (comment)   Criminal Activity/Legal Involvement Pertinent to Current Situation/Hospitalization: No - Comment as needed  Activities of Daily Living      Permission Sought/Granted                  Emotional Assessment       Orientation: : Oriented to Self, Oriented to Place, Oriented to  Time, Oriented to Situation Alcohol / Substance Use: Illicit Drugs Psych Involvement: No (comment)  Admission diagnosis:  Septic shock ([place]) [A41.9, R65.21] Patient  Active Problem List   Diagnosis Date Noted  . MRSA infection 12/06/2019  . Opioid use with withdrawal ([place])   . Endocarditis of tricuspid valve   . Palliative care encounter   . Septic shock ([place]) 11/30/2019  . Septic embolism ([place]) 11/05/2019  . AKI (acute kidney injury) ([place]) 11/05/2019  . Hyponatremia 11/05/2019  . Cellulitis of left foot 11/05/2019  . 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 Bacterial endocarditis 11/05/2019  . Acute bacterial endocarditis   . MRSA bacteremia   . Sepsis ([place]) 11/03/2019  . Abscess of face 08/20/2018  . Facial cellulitis 11/21/2017  . Depression   . IV drug abuse ([place])   . Suicidal ideation    PCP:  Patient, No Pcp Per Pharmacy:   [facility], [address]. [address] [place]. [place] [address] Phone: [phone] Fax: [phone]     Social Determinants of Health (SDOH) Interventions    Readmission Risk Interventions No flowsheet data found.

## 2019-12-13 NOTE — Progress Notes (Signed)
Patient's magnesium IV was started in right forearm IV, then stopped about 1/2 way thru, patient complains of it hurting, rate was started at 32ml/hr, I reduced rate to 85ml/hr, but site was still bothering her, I have stopped administration and paged infectious disease to ask dr Earlene Plater if we can place PICC line now, trying to avoid any other infiltrations from IV sites

## 2019-12-13 NOTE — Progress Notes (Signed)
Nutrition Follow-up  DOCUMENTATION CODES:   Not applicable  INTERVENTION:   - Ensure Enlive po TID, each supplement provides 350 kcal and 20 grams of protein  Nocturnal tube feeds via Cortrak: - Osmolite 1.5 @ 50 ml/hr x 12 hours from 1800 to 0600 (total of 600 ml nightly)  Tube feeding regimen provides 900 kcal, 38 grams of protein, and 457 ml of H2O (meets 50% of kcal needs and 48% of protein needs).  NUTRITION DIAGNOSIS:   Inadequate oral intake related to decreased appetite as evidenced by per patient/family report.  Progressing  GOAL:   Patient will meet greater than or equal to 90% of their needs  Unmet at this time  MONITOR:   PO intake, Supplement acceptance, Labs, Weight trends, TF tolerance, Skin, I & O's  REASON FOR ASSESSMENT:   Consult Enteral/tube feeding initiation and management (nocturnal TF)  ASSESSMENT:   29 yo female admitted with septic shock, AKI. PMH includes tricuspid MRSA endocarditis, IVDU.  11/22 - s/p TEE with angiovac for right atrial mass and tricuspid valve vegetation 11/24 - extubated, Cortrak placed (tip gastric), TF initiated  A 24-hour calorie count was completed. Pt found to be meeting 55% of estimated kcal needs and 19% of estimated protein needs. Discussed plan with MD. Will trial nocturnal TF via Cortrak and allow pt to eat during the day. Discussed plan with pt.  Pt reports that her appetite is improving and that she feels that she is eating like she did PTA. Pt has not consumed an Ensure Enlive yet because they make her feel full. She is willing to consume 1 daily. Discussed with pt the importance of improving PO intake to remove Cortrak tube. Pt expresses understanding. Pt reports that her abdomen no longer feels bloated and full. She confirms that she has been having BMs.  Admit weight: 51.1 kg Current weight: 49.9 kg  Medications reviewed and include: Ensure Enlive TID, methadone, thiamine, IV abx  Labs reviewed: sodium  132, elevated LFTs, hemoglobin 9.4 CBG's: 55-190  UOP: 550 ml x 24 hours I/O's: +14.9 L since admit  Diet Order:   Diet Order            DIET SOFT Room service appropriate? Yes; Fluid consistency: Thin  Diet effective now                 EDUCATION NEEDS:   Education needs have been addressed  Skin:  Skin Assessment: Skin Integrity Issues: Incisions: right neck, right groin Other: infiltration/phlebitis to left arm  Last BM:  12/12/19  Height:   Ht Readings from Last 1 Encounters:  12/05/19 5\' 2"  (1.575 m)    Weight:   Wt Readings from Last 1 Encounters:  12/13/19 49.9 kg    Ideal Body Weight:  50 kg  BMI:  Body mass index is 20.12 kg/m.  Estimated Nutritional Needs:   Kcal:  1800-2000  Protein:  80-95 gm  Fluid:  >/= 1.5 L    12/15/19, MS, RD, LDN Inpatient Clinical Dietitian Please see [facility] for contact information.

## 2019-12-13 NOTE — Progress Notes (Signed)
[facility] PROGRESS NOTE    Amber [name]  GYB:[id number] DOB: 03-Feb-1990 DOA: 11/30/2019 PCP: Patient, No Pcp Per      Brief Narrative:  Amber [name] is a 29 y.o. F with IVDU and known tricuspid MRSA endocarditis signed out AMA x2 who presented with progressive infection.  10/21 - 10/23: Patient admitted with fever, blood cultures growing MRSA, CT chest showed septic emboli and edematous L4-5 facet joint, and tricuspid vegetation on CT.  ==> left AMA  10/23 - 10/25: Returned after 2 hours with severe back pain.  Underwent TEE that showed 1cm TV mass.  Seen by CT surgery who recommended medical management. ==> left again AMA  In the interim, patient was staying with friends for a few weeks.  Eventually had worsening shortness of breath, weakness, fatigue, malaise and nausea, and decided to return to the hospital.  Arrived in the parking lot obtunded with needle in car on 11/17.  In the ER, hypotensive and started on pressors and blood transfusion and vancomycin and admitted.  Taken to OR for angiovac on admission.         Assessment & Plan:  Septic shock due to TV MRSA endocarditis s/p Angiovac Septic emboli due to MRSA endocarditis Acute hypoxic respiratory failure due to Septic shock More detailed summary 11/29  She remains afebrile, seems to be making gradual improvement over the last several days.  Still very weak unable to stand. -Continue daptomycin and doxycycline, per ID -Consult infectious disease, appreciate expertise  -Monitor CK     Severe protein calorie malnutrition Refeeding syndrome Anasarca Tachycardia Patient with BMI 17 on admission, did experience some refeeding syndrome, but K, mag, and Phos resolved, and edema actually improving.  Mild ascites on ultrasound yesterday.  Discussed tachycardia with cardiology.  This is sinus, more likely related to hypoalbuminemia, deconditioning, malnutrition, and anemia.  Less likely cardiac  given normal LV function, only moderately reduced RV function.  In that setting beta-blocker not advisable, diuretic likely not helpful, possibly harmful.  As the patient's nutrition improves, albumin improves, and physical stamina improve, her tachycardia should resolve.  If not we will consult cardiology on or around December 5.       Tricuspid insufficiency As above, cardiology reviewed echocardiogram with me 11/30, feel that likely this is having little functional impact. -If still tachycardic, dyspneic without improvement as nutrition improves, will re-consult cardiology     Transaminitis RUQ US unremarkable.  LFTs stable today. -Follow hep C rna     Hep C Ab postiive Amber [name]       IV infiltration See summary 11/29 -Consult [facility], appreciate cares    IVDU Opiate use disorder, severe Opiate withdrawal Delirium -Continue methadone -Continue clonazepam, taper dose Withdrawal is resolved, pain is controlled.    Methadone at 60 mg per day is likely not an outpatient/discharge option (methadone clinics likely only able to start at lower dose).  She will need to taper to 30-40 or replace this Suboxone prior to discharge.    -Constul TOC for SUD     Anemia of chronic disease Thrombocytopenia due to septic shock Transfused 1 unit PRBCs on 11/17, 1 units on 11/22; transfused 1 pack platelets 11/21; Transfused again 11/28 Platelets stable at 140s No evidence of hemolysis  Hemoglobin stable -Transfusion threshold 8 g/dL   Hyponatremia Mild, asymptomatic              Disposition: Status is: Inpatient  Remains inpatient appropriate because:Hemodynamically unstable and Persistent severe electrolyte  disturbances  Ongoing IV therapies needed  Dispo: The patient is from: Home              Anticipated d/c is to: Home              Anticipated d/c date is: > 3 days              Patient currently is not medically stable to d/c.    The patient was  admitted with MRSA tricuspid endocarditis.  Although her infection appears to be defervescing, she is significantly malnourished, anemic, and will require extended course IV antibiotics.  ID are directing her antibiotic therapy.   She is advancing her diet, and may be able to have her NG tube removed in 2-5 days.  After this is removed, she may be a PICC lock, SNF or in hospital candidate       MDM: The below labs and imaging reports were reviewed and summarized above.  Medication management as above.     DVT prophylaxis: SCDs Start: 11/30/19 1413  Code Status: FULL Family Communication:  Mtoher at bedside    Consultants:   CCM  CT surgery  Procedures:   11/17 intubation   11/18 ketamine started  11/22 angiovac  11/23 ketamine stopped  11/24 extubation  11/26 Central line removed  Antimicrobials:   Vancomycin 11/17 >> 11/22  Daptomycin 11/24 >>    Doxycycline 11/30 >>  Culture data:   11/17 blood culture x2 --- MRSA in 2/2  11/22 angiovac leaflet culture -- mRSA  11/23 blood culture x2 -- NGTD          Subjective: Abdominal distention resolved, no nausea.  Appetite better.  Breathing okay, cough still present but not severe.  No hemoptysis or sputum.  Severely generally weak.  No fever.        Objective: Vitals:   12/13/19 0305 12/13/19 0515 12/13/19 0807 12/13/19 1255  BP: 116/86  (!) 128/99 (!) 138/95  Pulse: 98  (!) 108 (!) 108  Resp: (!) 21  20 16   Temp: 98.6 F (37 C)  99.2 F (37.3 C) 99 F (37.2 C)  TempSrc:   Oral Oral  SpO2: 97%  100% 91%  Weight:  49.9 kg    Height:        Intake/Output Summary (Last 24 hours) at 12/13/2019 1431 Last data filed at 12/13/2019 0800 Gross per 24 hour  Intake 1346.42 ml  Output 550 ml  Net 796.42 ml   Filed Weights   12/11/19 0300 12/12/19 0500 12/13/19 0515  Weight: 50 kg 51.5 kg 49.9 kg    Examination: General appearance: Severely deconditioned adult female, lying in bed,  watching TV and eating lunch     HEENT: Anicteric, conjunctival pink, nose emaciated, lips dry, dentition normal, oropharynx dry, no oral lesions, hearing normal. Skin: No spreading redness around the left arm eschar, see pictures from ID no Cardiac: Tachycardic, regular, systolic murmur, JVP not visible, edema is resolved Respiratory: Respiratory effort normal, lung sounds normal, no rales or wheezing Abdomen: Abdomen soft, mild diffuse tenderness, mild ascites, no distention   MSK: Fuhs loss of subcutaneous muscle mass and fat Neuro: Awake and alert, moves all extremities with generalized weakness, symmetric strength, speech fluent Psych: Attention normal, affect normal, judgment and insight appear normal         Data Reviewed: I have personally reviewed following labs and imaging studies:  CBC: Recent Labs  Lab 12/09/19 0950 12/10/19 0112 12/11/19 0507 12/12/19 0047 12/13/19  0039  WBC 9.7 9.5 8.9 7.3 7.5  HGB 7.0* 7.8* 6.9* 8.8* 9.4*  HCT 24.3* 26.6* 23.8* 28.8* 31.7*  MCV 100.4* 98.5 99.2 95.0 96.6  PLT 129* PLATELET CLUMPS NOTED ON SMEAR, UNABLE TO ESTIMATE 123* 148* 166   Basic Metabolic Panel: Recent Labs  Lab 12/08/19 0423 12/08/19 0423 12/09/19 0034 12/09/19 0723 12/10/19 0439 12/11/19 0507 12/12/19 0047 12/13/19 0039  NA 138   < >  --  136 132* 134* 131* 132*  K 2.3*   < >  --  3.6 4.4 4.7 4.5 3.9  CL 101   < >  --  102 98 98 98 94*  CO2 23   < >  --  GLUCOSE 119*   < >  --  121* 127* 131* 82 169*  BUN 18   < >  --  CREATININE 0.57   < >  --  0.55 0.36* 0.39* <0.30* 0.49  CALCIUM 7.9*   < >  --  7.7* 8.0* 7.9* 8.1* 8.3*  MG 1.5*  --   --  1.7 1.7 1.7 1.7  --   PHOS 2.0*   < > 1.8*  --  2.2* 2.4* 2.7 2.8   < > = values in this interval not displayed.   GFR: Estimated Creatinine Clearance: 81.7 mL/min (by C-G formula based on SCr of 0.49 mg/dL). Liver Function Tests: Recent Labs  Lab 12/07/19 0405 12/11/19 0507  12/12/19 0047 12/13/19 0039  AST 112*  --  319* 305*  ALT 50*  --  79* 87*  ALKPHOS 120  --  147* 226*  BILITOT 1.4* 0.9 1.4* 1.3*  PROT 5.2*  --  5.8* 6.3*  ALBUMIN 1.7*  --  2.2* 2.3*   No results for input(s): LIPASE, AMYLASE in the last 168 hours. No results for input(s): AMMONIA in the last 168 hours. Coagulation Profile: No results for input(s): INR, PROTIME in the last 168 hours. Cardiac Enzymes: Recent Labs  Lab 12/07/19 0405 12/08/19 0423 12/11/19 0507  CKTOTAL 96 85 50   BNP (last 3 results) No results for input(s): PROBNP in the last 8760 hours. HbA1C: No results for input(s): HGBA1C in the last 72 hours. CBG: Recent Labs  Lab 12/13/19 0022 12/13/19 0052 12/13/19 0427 12/13/19 0805 12/13/19 1239  GLUCAP 60* 162* 190* 80 93   Lipid Profile: No results for input(s): CHOL, HDL, LDLCALC, TRIG, CHOLHDL, LDLDIRECT in the last 72 hours. Thyroid Function Tests: No results for input(s): TSH, T4TOTAL, FREET4, T3FREE, THYROIDAB in the last 72 hours. Anemia Panel: No results for input(s): VITAMINB12, FOLATE, FERRITIN, TIBC, IRON, RETICCTPCT in the last 72 hours. Urine analysis:    Component Value Date/Time   COLORURINE AMBER (A) 11/03/2019 0818   APPEARANCEUR CLOUDY (A) 11/03/2019 0818   APPEARANCEUR Turbid 01/27/2014 1811   LABSPEC 1.017 11/03/2019 0818   LABSPEC 1.011 01/27/2014 1811   PHURINE 5.0 11/03/2019 0818   GLUCOSEU [name] 11/03/2019 0818   GLUCOSEU [name] 01/27/2014 1811   HGBUR [name] 11/03/2019 0818   BILIRUBINUR [name] 11/03/2019 0818   BILIRUBINUR [name] 01/27/2014 1811   KETONESUR [name] 11/03/2019 0818   PROTEINUR [name] 11/03/2019 0818   UROBILINOGEN 0.2 10/14/2012 2010   NITRITE [name] 11/03/2019 0818   LEUKOCYTESUR MODERATE (A) 11/03/2019 0818   LEUKOCYTESUR [name] 01/27/2014 1811   Sepsis Labs: (procalcitonin:4,lacticacidven:4)  ) Recent Results (from the past 240 hour(s))  Fungus Culture With Stain  Status: None (Preliminary result)   Collection Time: 12/05/19 10:02 AM   Specimen: PATH Soft tissue  Result Value Ref Range Status   Fungus Stain Final report  Final    Comment: (NOTE) Performed At: [facility] [address] [place], [place] [id number] Jolene Schimke MD SN:[id number]    Fungus (Mycology) Culture PENDING  Incomplete   Fungal Source VALVE  Final    Comment: Performed at [facility], [address] [place], [place] [address]  Aerobic/Anaerobic Culture (surgical/deep wound)     Status: None   Collection Time: 12/05/19 10:02 AM   Specimen: PATH Soft tissue  Result Value Ref Range Status   Specimen Description VALVE  Final   Special Requests A TRICUSPID VALVE VEGETATION  Final   Gram Stain   Final    ABUNDANT WBC PRESENT, PREDOMINANTLY PMN ABUNDANT GRAM POSITIVE COCCI    Culture   Final    ABUNDANT METHICILLIN RESISTANT STAPHYLOCOCCUS AUREUS NO ANAEROBES ISOLATED Performed at [facility], [address] [place], [place] [address]    Report Status 12/10/2019 FINAL  Final   Organism ID, Bacteria METHICILLIN RESISTANT STAPHYLOCOCCUS AUREUS  Final      Susceptibility   Methicillin resistant staphylococcus aureus - MIC*    CIPROFLOXACIN >=8 RESISTANT Resistant     ERYTHROMYCIN >=8 RESISTANT Resistant     GENTAMICIN <=0.5 SENSITIVE Sensitive     OXACILLIN >=4 RESISTANT Resistant     TETRACYCLINE 2 SENSITIVE Sensitive     VANCOMYCIN 1 SENSITIVE Sensitive     TRIMETH/SULFA >=320 RESISTANT Resistant     CLINDAMYCIN >=8 RESISTANT Resistant     RIFAMPIN <=0.5 SENSITIVE Sensitive     Inducible Clindamycin [name] Sensitive     * ABUNDANT METHICILLIN RESISTANT STAPHYLOCOCCUS AUREUS  Acid Fast Smear (AFB)     Status: None   Collection Time: 12/05/19 10:02 AM   Specimen: PATH Soft tissue  Result Value Ref Range Status   AFB Specimen Processing Comment  Final    Comment: Tissue Grinding and Digestion/Decontamination   Acid Fast Smear [name]   Final    Comment: (NOTE) Performed At: [facility] [address] [place], [place] [id number] Jolene Schimke MD AS:[id number]    Source (AFB) VALVE  Final    Comment: Performed at [facility], [address], [place], [place] [address]  Fungus Culture Result     Status: None   Collection Time: 12/05/19 10:02 AM  Result Value Ref Range Status   Result 1 Comment  Final    Comment: (NOTE) KOH/Calcofluor preparation:  no fungus observed. Performed At: [facility] [address] [place], [place] [id number] Jolene Schimke MD KG:[id number]   Culture, blood (routine x 2)     Status: None   Collection Time: 12/06/19 11:16 AM   Specimen: BLOOD  Result Value Ref Range Status   Specimen Description BLOOD SITE NOT SPECIFIED  Final   Special Requests   Final    BOTTLES DRAWN AEROBIC AND ANAEROBIC Blood Culture adequate volume   Culture   Final    NO GROWTH 5 DAYS Performed at [facility], [address] [place], [place] [address]    Report Status 12/11/2019 FINAL  Final  Culture, blood (routine x 2)     Status: None   Collection Time: 12/06/19  5:52 PM   Specimen: BLOOD  Result Value Ref Range Status   Specimen Description BLOOD SITE NOT SPECIFIED  Final   Special Requests  Final    BOTTLES DRAWN AEROBIC AND ANAEROBIC Blood Culture adequate volume   Culture   Final    NO GROWTH 5 DAYS Performed at [facility], [address] [place], [place] [id number]    Report Status 12/11/2019 FINAL  Final         Radiology Studies: US Abdomen Complete  Result Date: 12/13/2019 CLINICAL DATA:  Elevated LFTs EXAM: ABDOMEN ULTRASOUND COMPLETE COMPARISON:  None. FINDINGS: Gallbladder: Gallbladder is well distended with tumefactive sludge identified. No wall thickening or pericholecystic fluid is noted. No gallstones are seen. Common bile duct: Diameter: 4.2 mm. Liver: No focal lesion identified. Within normal limits in parenchymal echogenicity. Portal vein  is patent on color Doppler imaging with normal direction of blood flow towards the liver. IVC: No abnormality visualized. Pancreas: Visualized portion unremarkable. Spleen: Spleen is elongated but demonstrates its normal spleniform shape. Calculated volume is within normal limits. Right Kidney: Length: 12.2 cm. Echogenicity within normal limits. No mass or hydronephrosis visualized. Left Kidney: Length: 10.6 cm. Echogenicity within normal limits. No mass or hydronephrosis visualized. Abdominal aorta: No aneurysm visualized. Other findings: Mild ascites is noted. Small peripancreatic lymph nodes are noted but not significant by size criteria IMPRESSION: Gallbladder sludge without complicating factors. Elongated spleen with normal shape and normal volume. Mild ascites. Electronically Signed   By: Alcide CleverMark  Lukens M.D.   On: 12/13/2019 00:07   ECHOCARDIOGRAM LIMITED  Result Date: 12/12/2019    ECHOCARDIOGRAM LIMITED REPORT   Patient Name:   Amber CaroliRICA L [name] Date of Exam: 12/12/2019 Medical Rec #:  [id number]        Height:       62.0 in Accession #:    [id number]       Weight:       113.5 lb Date of Birth:  02/06/1990         BSA:          1.503 m Patient Age:    29 years         BP:           139/100 mmHg Patient Gender: F                HR:           100 bpm. Exam Location:  Inpatient Procedure: Limited Echo, Color Doppler and Cardiac Doppler Indications:    I36.9 Nonrheumatic tricuspid valve disorder, unspecified  History:        Patient has prior history of Echocardiogram examinations, most                 recent 12/05/2019. Risk Factors:IVDU. Angiovac debulking of                 tricuspid valve endocarditis 12/05/19.  Sonographer:    Irving BurtonEmily Senior RDCS Referring Phys: [id number] Iasha Mccalister P Aliese Brannum  Sonographer Comments: Limited to evaluate TR and EF IMPRESSIONS  1. Left ventricular ejection fraction, by estimation, is 55 to 60%. The left ventricle has normal function.  2. Right ventricular systolic function is  mildly reduced. The right ventricular size is moderately enlarged. There is normal pulmonary artery systolic pressure. The estimated right ventricular systolic pressure is 28.8 mmHg  3. A small pericardial effusion is present.  4. The inferior vena cava is normal in size with <50% respiratory variability, suggesting right atrial pressure of 8 mmHg.  5. Compared to prior echo, large vegetation on septal leaflet of tricuspid valve is no longer seen. However there remains poor  coaptation of tricuspid valve leaflets. Tricuspid valve regurgitation is severe. FINDINGS  Left Ventricle: Left ventricular ejection fraction, by estimation, is 55 to 60%. The left ventricle has normal function. Right Ventricle: The right ventricular size is moderately enlarged. Right ventricular systolic function is mildly reduced. There is normal pulmonary artery systolic pressure. The tricuspid regurgitant velocity is 2.28 m/s, and with an assumed right atrial pressure of 8 mmHg, the estimated right ventricular systolic pressure is 28.8 mmHg. Pericardium: A small pericardial effusion is present. Mitral Valve: The mitral valve is normal in structure. Tricuspid Valve: The tricuspid valve is abnormal. Tricuspid valve regurgitation is severe. Aortic Valve: Aortic valve regurgitation is not visualized. Pulmonic Valve: The pulmonic valve was not well visualized. Pulmonic valve regurgitation is not visualized. Venous: The inferior vena cava is normal in size with less than 50% respiratory variability, suggesting right atrial pressure of 8 mmHg. TRICUSPID VALVE TR Peak grad:   20.8 mmHg TR Vmax:        228.00 cm/s Epifanio Lesches MD Electronically signed by Epifanio Lesches MD Signature Date/Time: 12/12/2019/5:25:31 PM    Final    [name] EKG SITE RITE  Result Date: 12/13/2019 If Site Rite image not attached, placement could not be confirmed due to current cardiac rhythm.       Scheduled Meds: . sodium chloride   Intravenous Once  .  sodium chloride   Intravenous Once  . chlorhexidine  15 mL Mouth Rinse BID  . Chlorhexidine Gluconate Cloth  6 each Topical Daily  . clonazePAM  0.5 mg Oral BID  . collagenase   Topical Daily  . doxycycline  100 mg Oral Q12H  . feeding supplement  237 mL Oral TID BM  . feeding supplement (OSMOLITE 1.5 CAL)  600 mL Per Tube Q24H  . mouth rinse  15 mL Mouth Rinse q12n4p  . methadone  20 mg Oral Q8H  . sodium chloride flush  10-40 mL Intracatheter Q12H  . thiamine  100 mg Oral Daily   Continuous Infusions: . sodium chloride 10 mL/hr at 12/05/19 0617  . sodium chloride    . sodium chloride Stopped (12/13/19 0140)  . DAPTOmycin (CUBICIN)  IV Stopped (12/12/19 2124)     LOS: 13 days    Time spent: 35 minutes    Alberteen Sam, MD [facility] 12/13/2019, 2:31 PM     Please page though AMION or Epic secure chat:  For [employer], Higher education careers adviser

## 2019-12-13 NOTE — Progress Notes (Signed)
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 PMhx: admission 10/23-10/26 for endocarditis left AMA, IVDU, cellulitis, depression    PT Comments    Pt supine in bed on arrival this session.  Pt required encouragement to participate in PT session this pm.  Focused on functional mobility in room as patient is limited due to pain and coughing spell.  Once back to bed noted with productive cough x 4.  Pt continues to require min assistance to mobilize.     Follow Up Recommendations  Home health PT;Supervision/Assistance - 24 hour     Equipment Recommendations  Rolling walker with 5" wheels;3in1 (PT)    Recommendations for Other Services       Precautions / Restrictions Precautions Precautions: Fall Precaution Comments: cortrak Restrictions Weight Bearing Restrictions: No    Mobility  Bed Mobility Overal bed mobility: Needs Assistance Bed Mobility: Supine to Sit     Supine to sit: Min guard Sit to supine: Min assist   General bed mobility comments: Pt able to move to edge of bed with decreased assistance today.   Remains to required min assistance to move back to bed.  Transfers Overall transfer level: Needs assistance Equipment used: Rolling walker (2 wheeled) Transfers: Sit to/from Stand Sit to Stand: Min assist         General transfer comment: Pt continues to require assistance for hand placement to avoid pulling on the RW.  Min guard from edge of bed and min assistance from lower seated commode.  Ambulation/Gait Ambulation/Gait assistance: Min assist Gait Distance (Feet): 12 Feet (x2) Assistive device: Rolling walker (2 wheeled) Gait Pattern/deviations: Step-through pattern;Decreased stride length;Shuffle;Trunk flexed     General Gait Details:  Pt continues to require cues for body position in RW and posture.  Pt flexing forward due to coughing spell.   Stairs             Wheelchair Mobility    Modified Rankin (Stroke Patients Only)       Balance Overall balance assessment: Needs assistance Sitting-balance support: Feet supported;No upper extremity supported Sitting balance-[name]: Fair       Standing balance-[name]: Poor                              Cognition Arousal/Alertness: Awake/alert Behavior During Therapy: Anxious Overall Cognitive Status: Impaired/Different from baseline Area of Impairment: Safety/judgement;Problem solving                         Safety/Judgement: Decreased awareness of safety   Problem Solving: Slow processing;Requires verbal cues        Exercises      General Comments        Pertinent Vitals/Pain Pain Score: 9  Pain Location: L forearm, ribs cage when coughing Pain Descriptors / Indicators: Aching;Guarding;Stabbing Pain Intervention(s): Monitored during session;RN gave pain meds during session;Repositioned    Home Living                      Prior Function            PT Goals (current goals can now be found in the care plan section) Acute Rehab PT Goals Patient Stated Goal: to use the  bathroom Potential to Achieve Goals: Fair Progress towards PT goals: Progressing toward goals    Frequency    Min 3X/week      PT Plan Current plan remains appropriate    Co-evaluation              AM-PAC PT "6 Clicks" Mobility   Outcome Measure  Help needed turning from your back to your side while in a flat bed without using bedrails?: A Little Help needed moving from lying on your back to sitting on the side of a flat bed without using bedrails?: A Little Help needed moving to and from a bed to a chair (including a wheelchair)?: A Little Help needed standing up from a chair using your arms (e.g., wheelchair or  bedside chair)?: A Little Help needed to walk in hospital room?: A Little Help needed climbing 3-5 steps with a railing? : A Little 6 Click Score: 18    End of Session Equipment Utilized During Treatment: Oxygen;Gait belt (2-3L [place]) Activity Tolerance: Patient limited by pain (and coughing) Patient left: in bed;with call bell/phone within reach;with bed alarm set Nurse Communication: Mobility status PT Visit Diagnosis: Other abnormalities of gait and mobility (R26.89);Muscle weakness (generalized) (M62.81);Pain     Time: 9485-4627 PT Time Calculation (min) (ACUTE ONLY): 37 min  Charges:  $Gait Training: 8-22 mins $Therapeutic Activity: 8-22 mins                     Bonney Leitz , PTA Acute Rehabilitation Services Pager [phone] Office [phone]     [name] 12/13/2019, 4:19 PM

## 2019-12-13 NOTE — Progress Notes (Addendum)
Calorie Count Note  24-hour calorie count ordered. Calorie count started yesterday at lunch meal.  Only data for lunch yesterday was available in calorie count envelope on pt's door. RD spoke with pt and obtained information from the pt regarding what she ate yesterday for dinner and today for breakfast.  Pt has been refusing Ensure Enlive because it makes her feel full. Discussed with pt the importance of adequate PO intake in healing and maintaining lean muscle mass. Pt states that she will try to drink 1 a day.  Diet: soft diet with thin liquids Supplements: - Ensure Enlive po TID, each supplement provides 350 kcal and 20 grams of protein  Results: 11/29 Lunch: 315 kcal, 5 grams of protein 11/29 Dinner: 200 kcal, 2 grams of protein 11/30 Breakfast: 483, 8 grams of protein Supplements: 0 kcal, 0 grams of protein  Total 24-hour intake: 998 kcal (55% of minimum estimated needs)  15 grams protein (19% of minimum estimated needs)  Nutrition Diagnosis: Inadequate oral intake related to inability to eat as evidenced by NPO status.  Goal: Patient will meet greater than or equal to 90% of their needs   Intervention: - d/c calorie count - Continue Ensure Enlive po TID - Recommend nocturnal TF via Cortrak, will discuss with MD   Mertie Clause, MS, RD, LDN Inpatient Clinical Dietitian Please see AMiON for contact information.

## 2019-12-14 LAB — GLUCOSE, CAPILLARY
Glucose-Capillary: 139 mg/dL — ABNORMAL HIGH (ref 70–99)
Glucose-Capillary: 112 mg/dL — ABNORMAL HIGH (ref 70–99)
Glucose-Capillary: 100 mg/dL — ABNORMAL HIGH (ref 70–99)
Glucose-Capillary: 77 mg/dL (ref 70–99)
Glucose-Capillary: 115 mg/dL — ABNORMAL HIGH (ref 70–99)

## 2019-12-14 LAB — BASIC METABOLIC PANEL
Anion gap: 8 (ref 5–15)
BUN: 5 mg/dL — ABNORMAL LOW (ref 6–20)
CO2: 29 mmol/L (ref 22–32)
Calcium: 7.8 mg/dL — ABNORMAL LOW (ref 8.9–10.3)
Chloride: 95 mmol/L — ABNORMAL LOW (ref 98–111)
Creatinine, Ser: 0.38 mg/dL — ABNORMAL LOW (ref 0.44–1.00)
GFR, Estimated: >60 mL/min (ref >60)
Glucose, Bld: 137 mg/dL — ABNORMAL HIGH (ref 70–99)
Potassium: 3.3 mmol/L — ABNORMAL LOW (ref 3.5–5.1)
Sodium: 132 mmol/L — ABNORMAL LOW (ref 135–145)

## 2019-12-14 LAB — CBC
HCT: 30.5 % — ABNORMAL LOW (ref 36.0–46.0)
Hemoglobin: 9.1 g/dL — ABNORMAL LOW (ref 12.0–15.0)
MCH: 28.8 pg (ref 26.0–34.0)
MCHC: 29.8 g/dL — ABNORMAL LOW (ref 30.0–36.0)
MCV: 96.5 fL (ref 80.0–100.0)
Platelets: 189 10*3/uL (ref 150–400)
RBC: 3.16 MIL/uL — ABNORMAL LOW (ref 3.87–5.11)
RDW: 22.5 % — ABNORMAL HIGH (ref 11.5–15.5)
WBC: 8.5 10*3/uL (ref 4.0–10.5)
nRBC: 0 % (ref 0.0–0.2)

## 2019-12-14 LAB — PHOSPHORUS: Phosphorus: 2.6 mg/dL (ref 2.5–4.6)

## 2019-12-14 LAB — MAGNESIUM: Magnesium: 1.5 mg/dL — ABNORMAL LOW (ref 1.7–2.4)

## 2019-12-14 MED ORDER — POTASSIUM CHLORIDE CRYS ER 20 MEQ PO TBCR
40.0000 meq | EXTENDED_RELEASE_TABLET | Freq: Two times a day (BID) | ORAL | Status: DC
  Administered 2019-12-14 – 2019-12-15 (×3): 40 meq via ORAL
  Filled 2019-12-14 (×3): qty 2

## 2019-12-14 MED ORDER — GUAIFENESIN 100 MG/5ML PO SOLN
5.0000 mL | ORAL | Status: DC | PRN
  Administered 2019-12-14 – 2019-12-15 (×2): 100 mg via ORAL
  Filled 2019-12-14 (×2): qty 5

## 2019-12-14 MED ORDER — MAGNESIUM SULFATE 2 GM/50ML IV SOLN
2.0000 g | Freq: Once | INTRAVENOUS | Status: AC
  Administered 2019-12-14: 2 g via INTRAVENOUS
  Filled 2019-12-14: qty 50

## 2019-12-14 MED ORDER — POTASSIUM CHLORIDE CRYS ER 20 MEQ PO TBCR
40.0000 meq | EXTENDED_RELEASE_TABLET | Freq: Once | ORAL | Status: AC
  Administered 2019-12-14: 40 meq via ORAL
  Filled 2019-12-14: qty 2

## 2019-12-14 NOTE — Progress Notes (Addendum)
PROGRESS NOTE    Amber [name]  ZOX:[id number] DOB: 01/31/1990 DOA: 11/30/2019 PCP: Patient, No Pcp Per    Brief Narrative:  Ms Amber [name] is a 29 y.o. F with IVDU and known tricuspid MRSA endocarditis signed out AMA x2 who presented with progressive infection.  10/21 - 10/23: Patient admitted with fever, blood cultures growing MRSA, CT chest showed septic emboli and edematous L4-5 facet joint, and tricuspid vegetation on CT.  ==> left AMA  10/23 - 10/25: Returned after 2 hours with severe back pain.  Underwent TEE that showed 1cm TV mass.  Seen by CT surgery who recommended medical management. ==> left again AMA  In the interim, patient was staying with friends for a few weeks.  Eventually had worsening shortness of breath, weakness, fatigue, malaise and nausea, and decided to return to the hospital.  Arrived in the parking lot obtunded with needle in car on 11/17.  In the ER, hypotensive and started on pressors and blood transfusion and vancomycin and admitted.  Taken to OR for angiovac on admission.    Consultants:   ID, palliative care, critical care  Procedures:   Antimicrobials:   Doxycycline, daptomycin   Subjective: Has no complaints.  Denies shortness of breath, abdominal pain.  Reports being tired and sleepy.  Objective: Vitals:   12/14/19 0100 12/14/19 0330 12/14/19 0812 12/14/19 1147  BP:  114/78 124/90   Pulse:   (!) 112   Resp:   20   Temp:  98.7 F (37.1 C) 98.9 F (37.2 C) 98.8 F (37.1 C)  TempSrc:  Oral Oral Oral  SpO2:   97% 96%  Weight: 48.8 kg     Height:        Intake/Output Summary (Last 24 hours) at 12/14/2019 1238 Last data filed at 12/14/2019 [id number] Gross per 24 hour  Intake 1305.03 ml  Output 500 ml  Net 805.03 ml   Filed Weights   12/12/19 0500 12/13/19 0515 12/14/19 0100  Weight: 51.5 kg 49.9 kg 48.8 kg    Examination:  General exam: Appears calm and comfortable, sleepy NGT in place Respiratory system: Clear to  auscultation. Respiratory effort normal. Cardiovascular system: S1 & S2 heard, RRR. No gallops or clicks.  Gastrointestinal system: Abdomen is nondistended, soft and nontender.  Normal bowel sounds heard. Central nervous system: Alert and oriented.  Grossly intact Extremities: No edema Skin: Warm dry Psychiatry:  Mood & affect appropriate in current setting.     Data Reviewed: I have personally reviewed following labs and imaging studies  CBC: Recent Labs  Lab 12/10/19 0112 12/11/19 0507 12/12/19 0047 12/13/19 0039 12/14/19 0354  WBC 9.5 8.9 7.3 7.5 8.5  HGB 7.8* 6.9* 8.8* 9.4* 9.1*  HCT 26.6* 23.8* 28.8* 31.7* 30.5*  MCV 98.5 99.2 95.0 96.6 96.5  PLT PLATELET CLUMPS NOTED ON SMEAR, UNABLE TO ESTIMATE 123* 148* 166 189   Basic Metabolic Panel: Recent Labs  Lab 12/09/19 0723 12/09/19 0723 12/10/19 0439 12/11/19 0507 12/12/19 0047 12/13/19 0039 12/14/19 0354  NA 136   < > 132* 134* 131* 132* 132*  K 3.6   < > 4.4 4.7 4.5 3.9 3.3*  CL 102   < > 98 98 98 94* 95*  CO2 25   < > 26 27 23 27 29   GLUCOSE 121*   < > 127* 131* 82 169* 137*  BUN 8   < > 8 9 10 7  5*  CREATININE 0.55   < > 0.36* 0.39* <0.30* 0.49 0.38*  CALCIUM  7.7*   < > 8.0* 7.9* 8.1* 8.3* 7.8*  MG 1.7  --  1.7 1.7 1.7  --  1.5*  PHOS  --   --  2.2* 2.4* 2.7 2.8 2.6   < > = values in this interval not displayed.   GFR: Estimated Creatinine Clearance: 79.9 mL/min (A) (by C-G formula based on SCr of 0.38 mg/dL (L)). Liver Function Tests: Recent Labs  Lab 12/11/19 0507 12/12/19 0047 12/13/19 0039  AST  --  319* 305*  ALT  --  79* 87*  ALKPHOS  --  147* 226*  BILITOT 0.9 1.4* 1.3*  PROT  --  5.8* 6.3*  ALBUMIN  --  2.2* 2.3*   No results for input(s): LIPASE, AMYLASE in the last 168 hours. No results for input(s): AMMONIA in the last 168 hours. Coagulation Profile: No results for input(s): INR, PROTIME in the last 168 hours. Cardiac Enzymes: Recent Labs  Lab 12/08/19 0423 12/11/19 0507  CKTOTAL  85 50   BNP (last 3 results) No results for input(s): PROBNP in the last 8760 hours. HbA1C: No results for input(s): HGBA1C in the last 72 hours. CBG: Recent Labs  Lab 12/13/19 1944 12/13/19 2321 12/14/19 0314 12/14/19 0820 12/14/19 1144  GLUCAP 156* 113* 139* 112* 100*   Lipid Profile: No results for input(s): CHOL, HDL, LDLCALC, TRIG, CHOLHDL, LDLDIRECT in the last 72 hours. Thyroid Function Tests: No results for input(s): TSH, T4TOTAL, FREET4, T3FREE, THYROIDAB in the last 72 hours. Anemia Panel: No results for input(s): VITAMINB12, FOLATE, FERRITIN, TIBC, IRON, RETICCTPCT in the last 72 hours. Sepsis Labs: No results for input(s): PROCALCITON, LATICACIDVEN in the last 168 hours.  Recent Results (from the past 240 hour(s))  Fungus Culture With Stain     Status: None (Preliminary result)   Collection Time: 12/05/19 10:02 AM   Specimen: PATH Soft tissue  Result Value Ref Range Status   Fungus Stain Final report  Final    Comment: (NOTE) Performed At: [facility] [address] [place], [place] [id number] Jolene Schimke MD FU:[id number]    Fungus (Mycology) Culture PENDING  Incomplete   Fungal Source VALVE  Final    Comment: Performed at [facility], [address] [place], [place] [address]  Aerobic/Anaerobic Culture (surgical/deep wound)     Status: None   Collection Time: 12/05/19 10:02 AM   Specimen: PATH Soft tissue  Result Value Ref Range Status   Specimen Description VALVE  Final   Special Requests A TRICUSPID VALVE VEGETATION  Final   Gram Stain   Final    ABUNDANT WBC PRESENT, PREDOMINANTLY PMN ABUNDANT GRAM POSITIVE COCCI    Culture   Final    ABUNDANT METHICILLIN RESISTANT STAPHYLOCOCCUS AUREUS NO ANAEROBES ISOLATED Performed at [facility], [address], [place], [place] [address]    Report Status 12/10/2019 FINAL  Final   Organism ID, Bacteria METHICILLIN RESISTANT STAPHYLOCOCCUS AUREUS  Final      Susceptibility    Methicillin resistant staphylococcus aureus - MIC*    CIPROFLOXACIN >=8 RESISTANT Resistant     ERYTHROMYCIN >=8 RESISTANT Resistant     GENTAMICIN <=0.5 SENSITIVE Sensitive     OXACILLIN >=4 RESISTANT Resistant     TETRACYCLINE 2 SENSITIVE Sensitive     VANCOMYCIN 1 SENSITIVE Sensitive     TRIMETH/SULFA >=320 RESISTANT Resistant     CLINDAMYCIN >=8 RESISTANT Resistant     RIFAMPIN <=0.5 SENSITIVE Sensitive     Inducible Clindamycin NEGATIVE Sensitive     *  ABUNDANT METHICILLIN RESISTANT STAPHYLOCOCCUS AUREUS  Acid Fast Smear (AFB)     Status: None   Collection Time: 12/05/19 10:02 AM   Specimen: PATH Soft tissue  Result Value Ref Range Status   AFB Specimen Processing Comment  Final    Comment: Tissue Grinding and Digestion/Decontamination   Acid Fast Smear Negative  Final    Comment: (NOTE) Performed At: [facility] [address] [place], [place] [id number] Jolene Schimke MD WU:[id number]    Source (AFB) VALVE  Final    Comment: Performed at [facility], [address] [place], [place] [address]  Fungus Culture Result     Status: None   Collection Time: 12/05/19 10:02 AM  Result Value Ref Range Status   Result 1 Comment  Final    Comment: (NOTE) KOH/Calcofluor preparation:  no fungus observed. Performed At: [facility] [address] [place], [place] [id number] Jolene Schimke MD NG:[id number]   Culture, blood (routine x 2)     Status: None   Collection Time: 12/06/19 11:16 AM   Specimen: BLOOD  Result Value Ref Range Status   Specimen Description BLOOD SITE NOT SPECIFIED  Final   Special Requests   Final    BOTTLES DRAWN AEROBIC AND ANAEROBIC Blood Culture adequate volume   Culture   Final    NO GROWTH 5 DAYS Performed at [facility], [address], [place], [place] [address]    Report Status 12/11/2019 FINAL  Final  Culture, blood (routine x 2)     Status: None   Collection Time: 12/06/19  5:52 PM   Specimen: BLOOD  Result Value  Ref Range Status   Specimen Description BLOOD SITE NOT SPECIFIED  Final   Special Requests   Final    BOTTLES DRAWN AEROBIC AND ANAEROBIC Blood Culture adequate volume   Culture   Final    NO GROWTH 5 DAYS Performed at [facility], [address] [place], [place] [address]    Report Status 12/11/2019 FINAL  Final         Radiology Studies: US Abdomen Complete  Result Date: 12/13/2019 CLINICAL DATA:  Elevated LFTs EXAM: ABDOMEN ULTRASOUND COMPLETE COMPARISON:  None. FINDINGS: Gallbladder: Gallbladder is well distended with tumefactive sludge identified. No wall thickening or pericholecystic fluid is noted. No gallstones are seen. Common bile duct: Diameter: 4.2 mm. Liver: No focal lesion identified. Within normal limits in parenchymal echogenicity. Portal vein is patent on color Doppler imaging with normal direction of blood flow towards the liver. IVC: No abnormality visualized. Pancreas: Visualized portion unremarkable. Spleen: Spleen is elongated but demonstrates its normal spleniform shape. Calculated volume is within normal limits. Right Kidney: Length: 12.2 cm. Echogenicity within normal limits. No mass or hydronephrosis visualized. Left Kidney: Length: 10.6 cm. Echogenicity within normal limits. No mass or hydronephrosis visualized. Abdominal aorta: No aneurysm visualized. Other findings: Mild ascites is noted. Small peripancreatic lymph nodes are noted but not significant by size criteria IMPRESSION: Gallbladder sludge without complicating factors. Elongated spleen with normal shape and normal volume. Mild ascites. Electronically Signed   By: Alcide Clever M.D.   On: 12/13/2019 00:07   [name] EKG SITE RITE  Result Date: 12/13/2019 If Site Rite image not attached, placement could not be confirmed due to current cardiac rhythm.       Scheduled Meds: . sodium chloride   Intravenous Once  . sodium chloride   Intravenous Once  . chlorhexidine  15 mL Mouth Rinse BID  .  Chlorhexidine Gluconate Cloth  6 each Topical Daily  . clonazePAM  0.5 mg Oral BID  . collagenase   Topical Daily  . doxycycline  100 mg Oral Q12H  . feeding supplement  237 mL Oral TID BM  . feeding supplement (OSMOLITE 1.5 CAL)  600 mL Per Tube Q24H  . mouth rinse  15 mL Mouth Rinse q12n4p  . methadone  20 mg Oral Q8H  . potassium chloride  40 mEq Oral BID  . sodium chloride flush  10-40 mL Intracatheter Q12H  . sodium chloride flush  10-40 mL Intracatheter Q12H  . thiamine  100 mg Oral Daily   Continuous Infusions: . sodium chloride 10 mL/hr at 12/05/19 0617  . sodium chloride    . sodium chloride Stopped (12/14/19 0616)  . DAPTOmycin (CUBICIN)  IV Stopped (12/13/19 2125)    Assessment & Plan:   Principal Problem:   MRSA infection Active Problems:   Septic shock (HCC)   Opioid use with withdrawal (HCC)   Endocarditis of tricuspid valve   Palliative care encounter   Septic shock due to TV MRSA endocarditis s/p Angiovac Septic emboli due to MRSA endocarditis Acute hypoxic respiratory failure due to Septic shock Remains afebrile, no leukocytosis Hemodynamically stable with gradual improvement Still weak unable to stand ID following Continue daptomycin and doxycycline Monitor CK     Severe protein calorie malnutrition Refeeding syndrome Anasarca Tachycardia Patient with BMI 17 on admission, did experience some refeeding syndrome, but K, mag, and Phos resolved, and edema actually improving.  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  LFTs stable today. Hep BS nonreactive hep B core antibody IgM nonreactive, HCV Ab reactive  Hypokalemia-mild We will replace with potassium Monitor levels   Hep C Ab postiive HIV negative     IV infiltration PICC placement    IVDU/cocaine+ Opiate use disorder, severe Opiate withdrawal Delirium -Continue methadone -Continue clonazepam, taper dose Withdrawal is resolved, pain is controlled.   Methadone at 60 mg per day is likely not an outpatient/discharge option (methadone clinics likely only able to start at lower dose).  She will need to taper to 30-40 or replace this Suboxone prior to discharge.    -Consult TOC for SUD     Anemia of chronic disease Thrombocytopenia due to septic shock Transfused 1 unit PRBCs on 11/17, 1 units on 11/22; transfused 1 pack platelets 11/21; Transfused again 11/28 Platelets are slowly improving No evidence of hemolysis Hemoglobin stable -Transfusion threshold 8 g/dL   Hyponatremia Mild, asymptomatic      PT recommends home health      DVT prophylaxis: SCD Code Status: Full Family Communication: None at bedside  Status is: Inpatient  Remains inpatient appropriate because:IV treatments appropriate due to intensity of illness or inability to take PO   Dispo: The patient is from: Home              Anticipated d/c is to: Home vs SNF she  Anticipated d/c date is: > 3 days              Patient currently is not medically stable to d/c.Pt with h/xo IVDU, with Endocarditis, needing extended abx tx.Still with NGT.            LOS: 14 days   Time spent: 45 minutes with more than 50% on COC    Lynn Ito, MD [facility] Pager [phone]  If 7PM-7AM, please contact  night-coverage [email or web address] Password [facility] 12/14/2019, 12:38 PM

## 2019-12-15 LAB — BASIC METABOLIC PANEL
Anion gap: 6 (ref 5–15)
BUN: 5 mg/dL — ABNORMAL LOW (ref 6–20)
CO2: 29 mmol/L (ref 22–32)
Calcium: 8 mg/dL — ABNORMAL LOW (ref 8.9–10.3)
Chloride: 98 mmol/L (ref 98–111)
Creatinine, Ser: 0.35 mg/dL — ABNORMAL LOW (ref 0.44–1.00)
GFR, Estimated: >60 mL/min (ref >60)
Glucose, Bld: 105 mg/dL — ABNORMAL HIGH (ref 70–99)
Potassium: 5.1 mmol/L (ref 3.5–5.1)
Sodium: 133 mmol/L — ABNORMAL LOW (ref 135–145)

## 2019-12-15 LAB — HEPATIC FUNCTION PANEL
ALT: 49 U/L — ABNORMAL HIGH (ref 0–44)
AST: 71 U/L — ABNORMAL HIGH (ref 15–41)
Albumin: 2 g/dL — ABNORMAL LOW (ref 3.5–5.0)
Alkaline Phosphatase: 140 U/L — ABNORMAL HIGH (ref 38–126)
Bilirubin, Direct: 0.3 mg/dL — ABNORMAL HIGH (ref 0.0–0.2)
Indirect Bilirubin: 0.7 mg/dL (ref 0.3–0.9)
Total Bilirubin: 1 mg/dL (ref 0.3–1.2)
Total Protein: 5.7 g/dL — ABNORMAL LOW (ref 6.5–8.1)

## 2019-12-15 LAB — CBC
HCT: 30.2 % — ABNORMAL LOW (ref 36.0–46.0)
Hemoglobin: 8.8 g/dL — ABNORMAL LOW (ref 12.0–15.0)
MCH: 28.7 pg (ref 26.0–34.0)
MCHC: 29.1 g/dL — ABNORMAL LOW (ref 30.0–36.0)
MCV: 98.4 fL (ref 80.0–100.0)
Platelets: 167 10*3/uL (ref 150–400)
RBC: 3.07 MIL/uL — ABNORMAL LOW (ref 3.87–5.11)
RDW: 22.5 % — ABNORMAL HIGH (ref 11.5–15.5)
WBC: 6.6 10*3/uL (ref 4.0–10.5)
nRBC: 0 % (ref 0.0–0.2)

## 2019-12-15 LAB — GLUCOSE, CAPILLARY
Glucose-Capillary: 106 mg/dL — ABNORMAL HIGH (ref 70–99)
Glucose-Capillary: 115 mg/dL — ABNORMAL HIGH (ref 70–99)
Glucose-Capillary: 124 mg/dL — ABNORMAL HIGH (ref 70–99)
Glucose-Capillary: 74 mg/dL (ref 70–99)
Glucose-Capillary: 83 mg/dL (ref 70–99)
Glucose-Capillary: 90 mg/dL (ref 70–99)

## 2019-12-15 LAB — MAGNESIUM: Magnesium: 1.7 mg/dL (ref 1.7–2.4)

## 2019-12-15 LAB — CK: Total CK: 19 U/L — ABNORMAL LOW (ref 38–234)

## 2019-12-15 MED ORDER — GUAIFENESIN 100 MG/5ML PO SOLN
5.0000 mL | ORAL | Status: DC | PRN
  Administered 2019-12-16 – 2019-12-21 (×8): 100 mg via ORAL
  Filled 2019-12-15 (×9): qty 5

## 2019-12-15 NOTE — Progress Notes (Signed)
PROGRESS NOTE    Amber [name]  ZOX:[id number] DOB: 08/19/1990 DOA: 11/30/2019 PCP: Patient, No Pcp Per    Brief Narrative:  Ms Amber [name] is a 29 y.o. F with IVDU and known tricuspid MRSA endocarditis signed out AMA x2 who presented with progressive infection.  10/21 - 10/23: Patient admitted with fever, blood cultures growing MRSA, CT chest showed septic emboli and edematous L4-5 facet joint, and tricuspid vegetation on CT.  ==> left AMA  10/23 - 10/25: Returned after 2 hours with severe back pain.  Underwent TEE that showed 1cm TV mass.  Seen by CT surgery who recommended medical management. ==> left again AMA  In the interim, patient was staying with friends for a few weeks.  Eventually had worsening shortness of breath, weakness, fatigue, malaise and nausea, and decided to return to the hospital.  Arrived in the parking lot obtunded with needle in car on 11/17.  In the ER, hypotensive and started on pressors and blood transfusion and vancomycin and admitted.  Taken to OR for angiovac on admission.    Consultants:   ID, palliative care, critical care  Procedures:   Antimicrobials:   Doxycycline, daptomycin   Subjective: Has no complaints of sob, cp, fever, chills  Objective: Vitals:   12/14/19 2331 12/15/19 0325 12/15/19 0415 12/15/19 0737  BP: 119/84 130/71  124/88  Pulse: 94 97  (!) 106  Resp: 18 19  19   Temp: 97.6 F (36.4 C) 98.4 F (36.9 C)  98 F (36.7 C)  TempSrc: Oral Oral  Oral  SpO2: 98% 100%  100%  Weight:   49.2 kg   Height:        Intake/Output Summary (Last 24 hours) at 12/15/2019 0808 Last data filed at 12/15/2019 [id number] Gross per 24 hour  Intake 1009.72 ml  Output 1500 ml  Net -490.28 ml   Filed Weights   12/13/19 0515 12/14/19 0100 12/15/19 0415  Weight: 49.9 kg 48.8 kg 49.2 kg    Examination: Calm, comfortable CTA, no wheeze rales rhonchi Regular S1/S2 no gallop Benign positive bowel sounds nontender No edema Mood and affect  appropriate in current setting   Data Reviewed: I have personally reviewed following labs and imaging studies  CBC: Recent Labs  Lab 12/11/19 0507 12/12/19 0047 12/13/19 0039 12/14/19 0354 12/15/19 0325  WBC 8.9 7.3 7.5 8.5 6.6  HGB 6.9* 8.8* 9.4* 9.1* 8.8*  HCT 23.8* 28.8* 31.7* 30.5* 30.2*  MCV 99.2 95.0 96.6 96.5 98.4  PLT 123* 148* 166 189 167   Basic Metabolic Panel: Recent Labs  Lab 12/10/19 0439 12/10/19 0439 12/11/19 0507 12/12/19 0047 12/13/19 0039 12/14/19 0354 12/15/19 0325  NA 132*   < > 134* 131* 132* 132* 133*  K 4.4   < > 4.7 4.5 3.9 3.3* 5.1  CL 98   < > 98 98 94* 95* 98  CO2 26   < > 27 23 27 29 29   GLUCOSE 127*   < > 131* 82 169* 137* 105*  BUN 8   < > 9 10 7  5* 5*  CREATININE 0.36*   < > 0.39* <0.30* 0.49 0.38* 0.35*  CALCIUM 8.0*   < > 7.9* 8.1* 8.3* 7.8* 8.0*  MG 1.7  --  1.7 1.7  --  1.5* 1.7  PHOS 2.2*  --  2.4* 2.7 2.8 2.6  --    < > = values in this interval not displayed.   GFR: Estimated Creatinine Clearance: 80.6 mL/min (A) (by C-G formula based  on SCr of 0.35 mg/dL (L)). Liver Function Tests: Recent Labs  Lab 12/11/19 0507 12/12/19 0047 12/13/19 0039  AST  --  319* 305*  ALT  --  79* 87*  ALKPHOS  --  147* 226*  BILITOT 0.9 1.4* 1.3*  PROT  --  5.8* 6.3*  ALBUMIN  --  2.2* 2.3*   No results for input(s): LIPASE, AMYLASE in the last 168 hours. No results for input(s): AMMONIA in the last 168 hours. Coagulation Profile: No results for input(s): INR, PROTIME in the last 168 hours. Cardiac Enzymes: Recent Labs  Lab 12/11/19 0507 12/15/19 0325  CKTOTAL 50 19*   BNP (last 3 results) No results for input(s): PROBNP in the last 8760 hours. HbA1C: No results for input(s): HGBA1C in the last 72 hours. CBG: Recent Labs  Lab 12/14/19 1603 12/14/19 2020 12/15/19 0000 12/15/19 0325 12/15/19 0741  GLUCAP 77 115* 115* 106* 74   Lipid Profile: No results for input(s): CHOL, HDL, LDLCALC, TRIG, CHOLHDL, LDLDIRECT in the last  72 hours. Thyroid Function Tests: No results for input(s): TSH, T4TOTAL, FREET4, T3FREE, THYROIDAB in the last 72 hours. Anemia Panel: No results for input(s): VITAMINB12, FOLATE, FERRITIN, TIBC, IRON, RETICCTPCT in the last 72 hours. Sepsis Labs: No results for input(s): PROCALCITON, LATICACIDVEN in the last 168 hours.  Recent Results (from the past 240 hour(s))  Fungus Culture With Stain     Status: None (Preliminary result)   Collection Time: 12/05/19 10:02 AM   Specimen: PATH Soft tissue  Result Value Ref Range Status   Fungus Stain Final report  Final    Comment: (NOTE) Performed At: [facility] [address] [place], [place] [id number] Amber Schimke MD BM:[id number]    Fungus (Mycology) Culture PENDING  Incomplete   Fungal Source VALVE  Final    Comment: Performed at [facility], [address] [place], [place] [address]  Aerobic/Anaerobic Culture (surgical/deep wound)     Status: None   Collection Time: 12/05/19 10:02 AM   Specimen: PATH Soft tissue  Result Value Ref Range Status   Specimen Description VALVE  Final   Special Requests A TRICUSPID VALVE VEGETATION  Final   Gram Stain   Final    ABUNDANT WBC PRESENT, PREDOMINANTLY PMN ABUNDANT GRAM POSITIVE COCCI    Culture   Final    ABUNDANT METHICILLIN RESISTANT STAPHYLOCOCCUS AUREUS NO ANAEROBES ISOLATED Performed at [facility], [address] [place], [place] [address]    Report Status 12/10/2019 FINAL  Final   Organism ID, Bacteria METHICILLIN RESISTANT STAPHYLOCOCCUS AUREUS  Final      Susceptibility   Methicillin resistant staphylococcus aureus - MIC*    CIPROFLOXACIN >=8 RESISTANT Resistant     ERYTHROMYCIN >=8 RESISTANT Resistant     GENTAMICIN <=0.5 SENSITIVE Sensitive     OXACILLIN >=4 RESISTANT Resistant     TETRACYCLINE 2 SENSITIVE Sensitive     VANCOMYCIN 1 SENSITIVE Sensitive     TRIMETH/SULFA >=320 RESISTANT Resistant     CLINDAMYCIN >=8 RESISTANT Resistant     RIFAMPIN  <=0.5 SENSITIVE Sensitive     Inducible Clindamycin NEGATIVE Sensitive     * ABUNDANT METHICILLIN RESISTANT STAPHYLOCOCCUS AUREUS  Acid Fast Smear (AFB)     Status: None   Collection Time: 12/05/19 10:02 AM   Specimen: PATH Soft tissue  Result Value Ref Range Status   AFB Specimen Processing Comment  Final    Comment: Tissue Grinding and Digestion/Decontamination   Acid Fast Smear Negative  Final    Comment: (NOTE) Performed At: [facility] [address] [place], [place] [id number] Amber Schimke MD HD:[id number]    Source (AFB) VALVE  Final    Comment: Performed at [facility], [address] [place], [place] [address]  Fungus Culture Result     Status: None   Collection Time: 12/05/19 10:02 AM  Result Value Ref Range Status   Result 1 Comment  Final    Comment: (NOTE) KOH/Calcofluor preparation:  no fungus observed. Performed At: [facility] [address] [place], [place] [id number] Amber Schimke MD EH:[id number]   Culture, blood (routine x 2)     Status: None   Collection Time: 12/06/19 11:16 AM   Specimen: BLOOD  Result Value Ref Range Status   Specimen Description BLOOD SITE NOT SPECIFIED  Final   Special Requests   Final    BOTTLES DRAWN AEROBIC AND ANAEROBIC Blood Culture adequate volume   Culture   Final    NO GROWTH 5 DAYS Performed at [facility], [address], [place], [place] [address]    Report Status 12/11/2019 FINAL  Final  Culture, blood (routine x 2)     Status: None   Collection Time: 12/06/19  5:52 PM   Specimen: BLOOD  Result Value Ref Range Status   Specimen Description BLOOD SITE NOT SPECIFIED  Final   Special Requests   Final    BOTTLES DRAWN AEROBIC AND ANAEROBIC Blood Culture adequate volume   Culture   Final    NO GROWTH 5 DAYS Performed at [facility], [address] [place], [place] [address]    Report Status 12/11/2019 FINAL  Final         Radiology Studies: [name] EKG SITE RITE  Result  Date: 12/13/2019 If Site Rite image not attached, placement could not be confirmed due to current cardiac rhythm.       Scheduled Meds: . sodium chloride   Intravenous Once  . sodium chloride   Intravenous Once  . chlorhexidine  15 mL Mouth Rinse BID  . Chlorhexidine Gluconate Cloth  6 each Topical Daily  . clonazePAM  0.5 mg Oral BID  . collagenase   Topical Daily  . doxycycline  100 mg Oral Q12H  . feeding supplement  237 mL Oral TID BM  . feeding supplement (OSMOLITE 1.5 CAL)  600 mL Per Tube Q24H  . mouth rinse  15 mL Mouth Rinse q12n4p  . methadone  20 mg Oral Q8H  . potassium chloride  40 mEq Oral BID  . sodium chloride flush  10-40 mL Intracatheter Q12H  . sodium chloride flush  10-40 mL Intracatheter Q12H  . thiamine  100 mg Oral Daily   Continuous Infusions: . sodium chloride 10 mL/hr at 12/05/19 0617  . sodium chloride    . sodium chloride Stopped (12/15/19 0324)  . DAPTOmycin (CUBICIN)  IV Stopped (12/14/19 2131)    Assessment & Plan:   Principal Problem:   MRSA infection Active Problems:   Septic shock (HCC)   Opioid use with withdrawal (HCC)   Endocarditis of tricuspid valve   Palliative care encounter   Septic shock due to TV MRSA endocarditis s/p Angiovac Septic emboli due to MRSA endocarditis Acute hypoxic respiratory failure due to Septic shock Afebrile, no leukocytosis Hemodynamically stable Still weak unable to stand ID following-continue Dapto and doxycycline Monitor CK-trended down       Severe protein calorie malnutrition Refeeding syndrome Anasarca Tachycardia Patient with  BMI 17 on admission, did experience some refeeding syndrome, but K, mag, and Phos resolved, and edema actually improving.  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  LFTs stable today. 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   Methadone at 60 mg per day is likely not an outpatient/discharge option (methadone clinics likely only able to start at lower dose).  She will need to taper to 30-40 or replace this Suboxone prior to discharge.    -Consult TOC for SUD     Anemia of chronic disease Thrombocytopenia due to septic shock Transfused 1 unit PRBCs on 11/17, 1 units on 11/22; transfused 1 pack platelets 11/21; Transfused again 11/28 Platelets are slowly improving No evidence of hemolysis Hemoglobin stable -Transfusion threshold 8 g/dL   Hyponatremia Mild, asymptomatic      PT recommends home health      DVT prophylaxis: SCD Code Status: Full Family Communication: None at bedside  Status is: Inpatient  Remains inpatient appropriate because:IV treatments appropriate due to intensity of illness or inability to take PO   Dispo: The patient is from: Home              Anticipated d/c is to: Home vs SNF she              Anticipated d/c date is: > 3 days              Patient currently is not medically stable to d/c.Pt with h/xo IVDU, with Endocarditis,  needing extended abx tx.Still with NGT.            LOS: 15 days   Time spent: 45 minutes with more than 50% on COC    Lynn Ito, MD [facility] Pager [phone]  If 7PM-7AM, please contact night-coverage [email or web address] Password [email or web address] 12/15/2019, 8:08 AM

## 2019-12-16 ENCOUNTER — Inpatient Hospital Stay (HOSPITAL_COMMUNITY): Payer: Self-pay

## 2019-12-16 DIAGNOSIS — R768 Other specified abnormal immunological findings in serum: Secondary | ICD-10-CM

## 2019-12-16 LAB — COMPREHENSIVE METABOLIC PANEL
ALT: 42 U/L (ref 0–44)
AST: 52 U/L — ABNORMAL HIGH (ref 15–41)
Albumin: 2 g/dL — ABNORMAL LOW (ref 3.5–5.0)
Alkaline Phosphatase: 122 U/L (ref 38–126)
Anion gap: 5 (ref 5–15)
BUN: <5 mg/dL — ABNORMAL LOW (ref 6–20)
CO2: 30 mmol/L (ref 22–32)
Calcium: 8.3 mg/dL — ABNORMAL LOW (ref 8.9–10.3)
Chloride: 98 mmol/L (ref 98–111)
Creatinine, Ser: 0.32 mg/dL — ABNORMAL LOW (ref 0.44–1.00)
GFR, Estimated: >60 mL/min (ref >60)
Glucose, Bld: 102 mg/dL — ABNORMAL HIGH (ref 70–99)
Potassium: 4.7 mmol/L (ref 3.5–5.1)
Sodium: 133 mmol/L — ABNORMAL LOW (ref 135–145)
Total Bilirubin: 0.8 mg/dL (ref 0.3–1.2)
Total Protein: 5.7 g/dL — ABNORMAL LOW (ref 6.5–8.1)

## 2019-12-16 LAB — GLUCOSE, CAPILLARY
Glucose-Capillary: 105 mg/dL — ABNORMAL HIGH (ref 70–99)
Glucose-Capillary: 122 mg/dL — ABNORMAL HIGH (ref 70–99)
Glucose-Capillary: 129 mg/dL — ABNORMAL HIGH (ref 70–99)
Glucose-Capillary: 74 mg/dL (ref 70–99)
Glucose-Capillary: 76 mg/dL (ref 70–99)
Glucose-Capillary: 89 mg/dL (ref 70–99)

## 2019-12-16 LAB — CBC
HCT: 27.5 % — ABNORMAL LOW (ref 36.0–46.0)
Hemoglobin: 8.1 g/dL — ABNORMAL LOW (ref 12.0–15.0)
MCH: 28.8 pg (ref 26.0–34.0)
MCHC: 29.5 g/dL — ABNORMAL LOW (ref 30.0–36.0)
MCV: 97.9 fL (ref 80.0–100.0)
Platelets: 161 10*3/uL (ref 150–400)
RBC: 2.81 MIL/uL — ABNORMAL LOW (ref 3.87–5.11)
RDW: 21.4 % — ABNORMAL HIGH (ref 11.5–15.5)
WBC: 6.5 10*3/uL (ref 4.0–10.5)
nRBC: 0 % (ref 0.0–0.2)

## 2019-12-16 IMAGING — CT CT CHEST W/O CM
2 of 4 series · 15 of 36 positions shown, 18 images · non-contrast
Comparison: Chest x-ray [DATE]. CT scan of the chest
[DATE].

CLINICAL DATA: Persistent abnormal x-ray. Shortness of breath and
chest pain. A CT scan from [DATE] demonstrated signs of
probable septic emboli.

EXAM:
CT CHEST WITHOUT CONTRAST
TECHNIQUE: Multidetector CT imaging of the chest was performed following the
standard protocol without IV contrast.

[Series 4: thorax 2.0 · axial · 0.63mm/px · z∈[-190,+18]mm · 12 of 118 slices shown, 15 images]
[im 7/118  mediastinal]
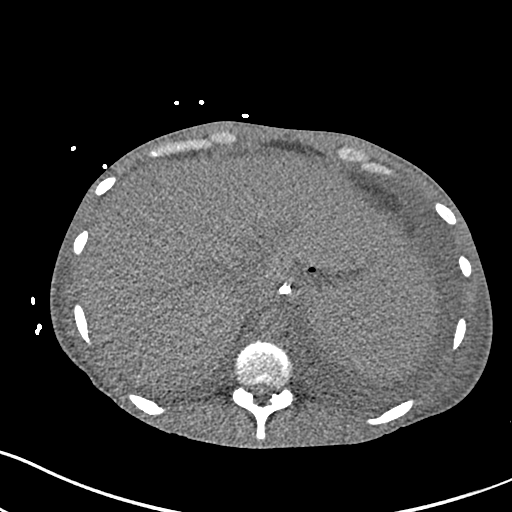
[im 7/118  lung]
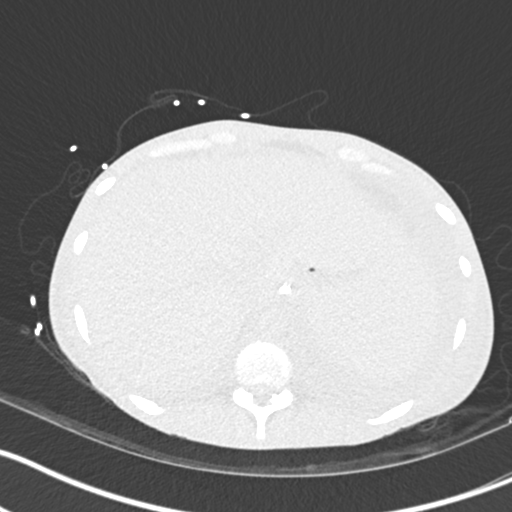
[im 19/118  lung]
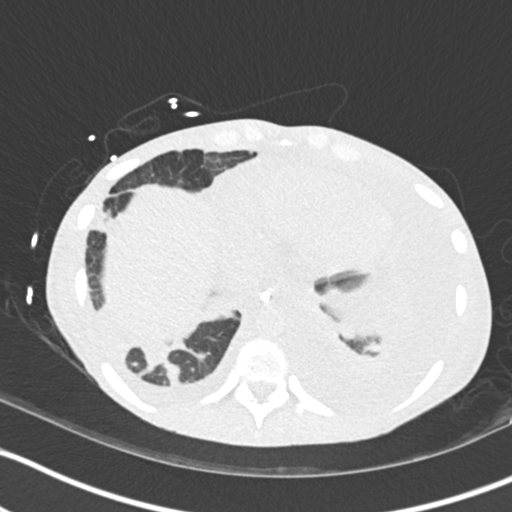
[im 25/118  lung]
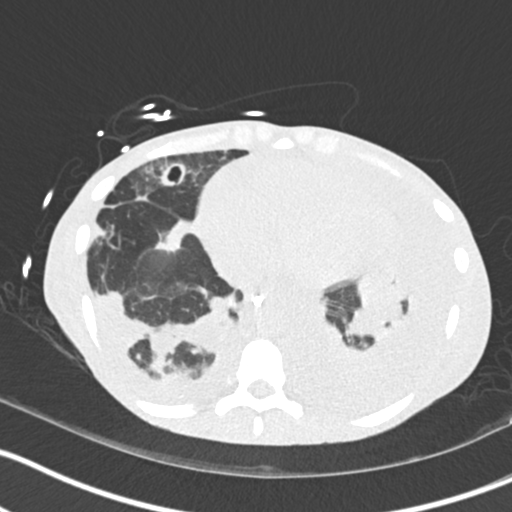
[im 37/118  lung]
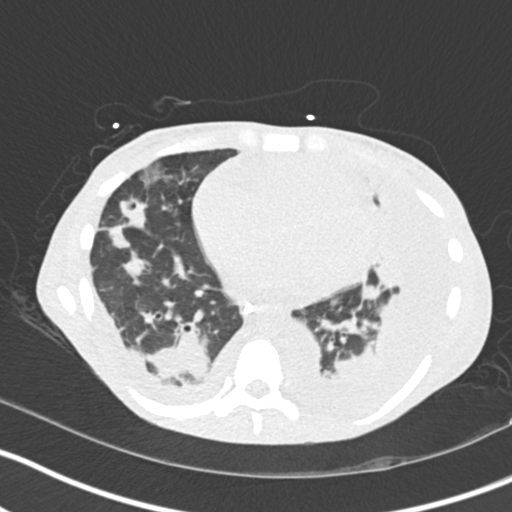
[im 44/118  mediastinal]
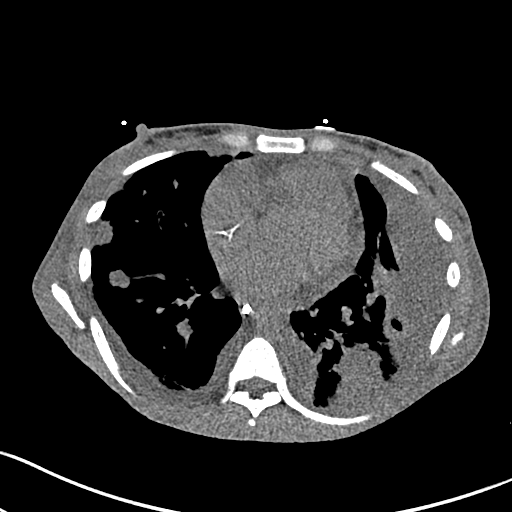
[im 44/118  lung]
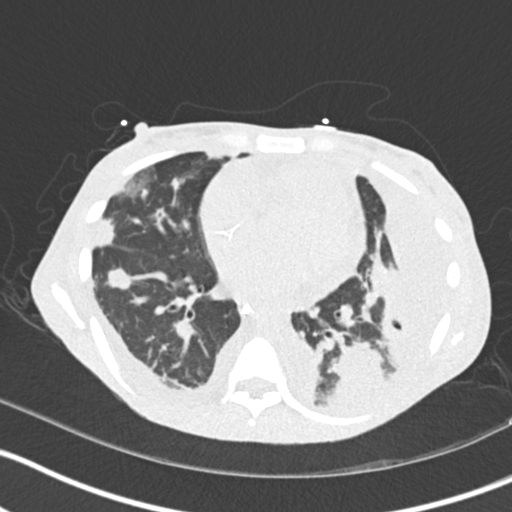
[im 56/118  lung]
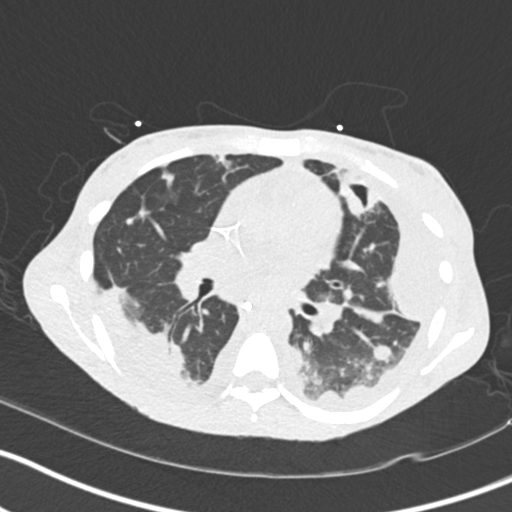
[im 62/118  lung]
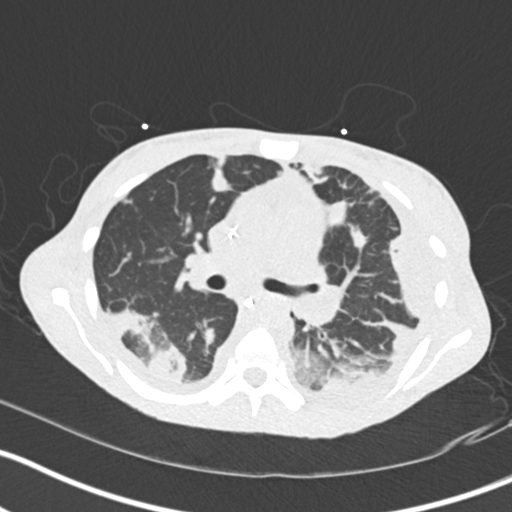
[im 74/118  lung]
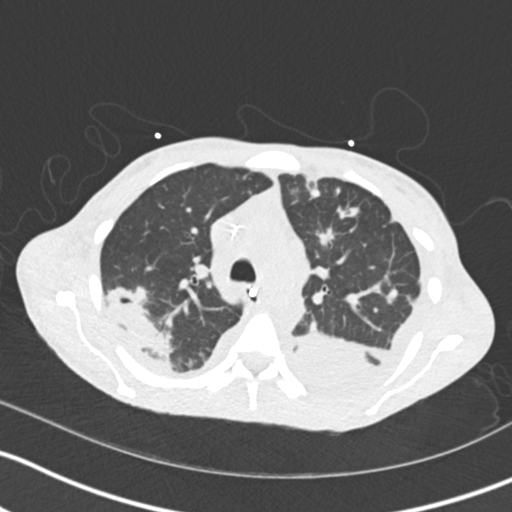
[im 81/118  mediastinal]
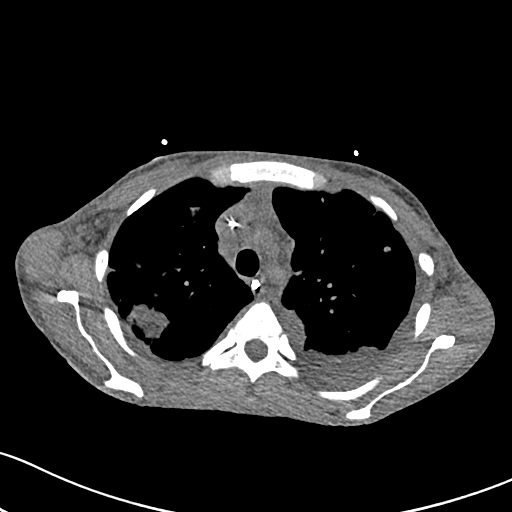
[im 81/118  lung]
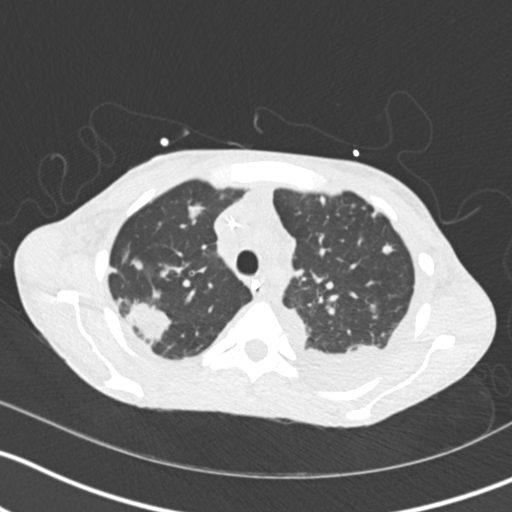
[im 93/118  lung]
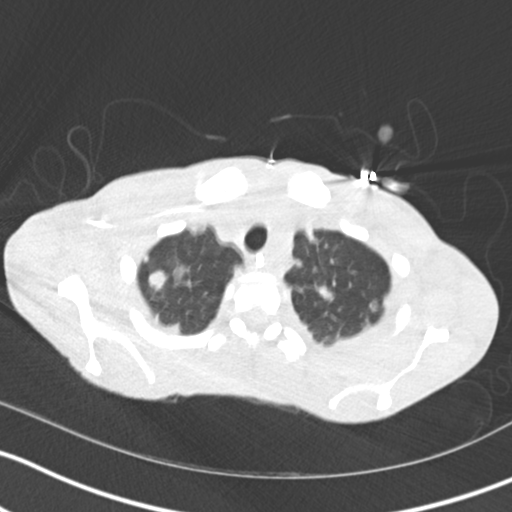
[im 99/118  lung]
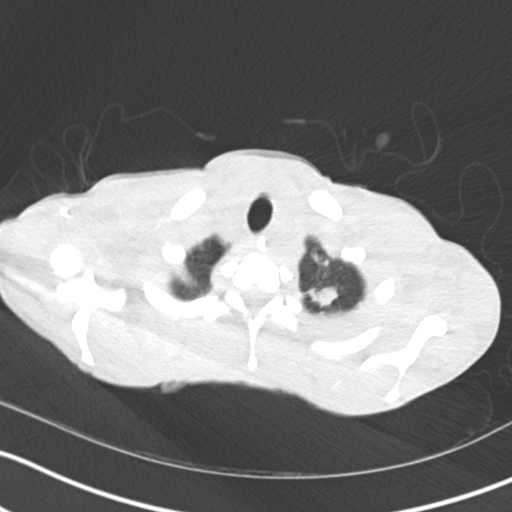
[im 111/118  lung]
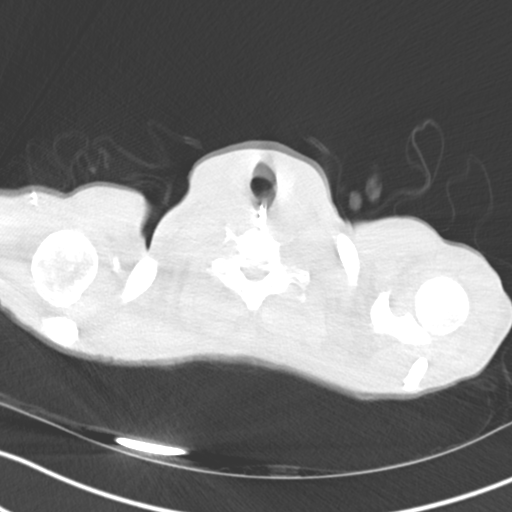

[Series 6: coronal · coronal · 0.49mm/px · 3 of 89 slices shown]
[im 18/89  lung]
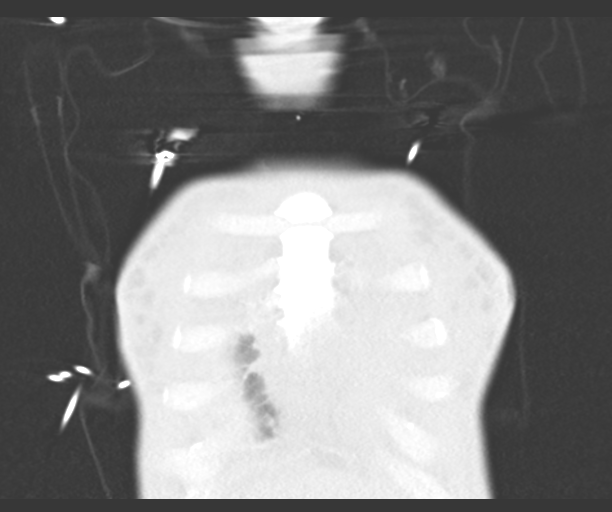
[im 36/89  lung]
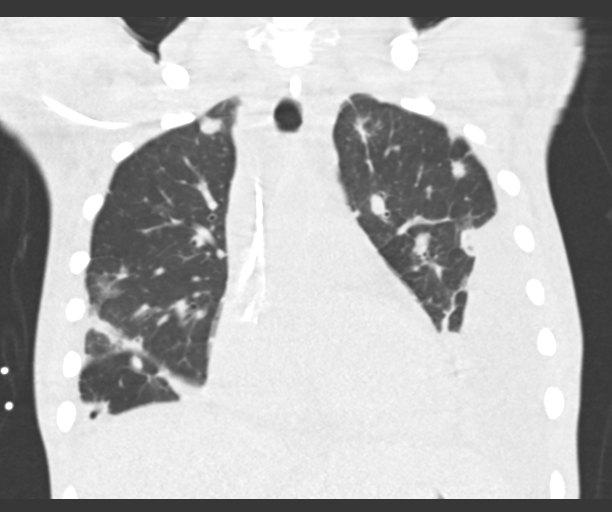
[im 53/89  lung]
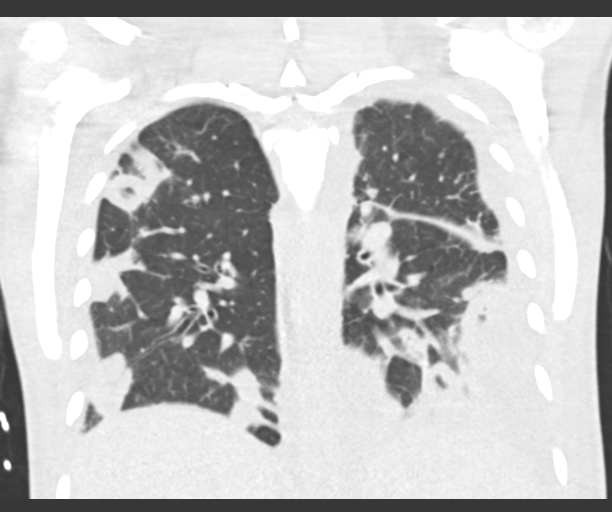

[15 of 36 positions shown; findings below may reference images not displayed]

FINDINGS: Cardiovascular: No significant vascular findings. Normal heart size.
No pericardial effusion.

Mediastinum/Nodes: A right PICC line terminates near the caval
atrial junction. A feeding tube terminates below today's film.
Bilateral pleural effusions are larger in the interval, small on the
right and small to moderate on the left. Portions of the left
effusion appear to be loculated in non dependent locations. No
pericardial effusion definitely seen. Evaluation for adenopathy is
limited due to lack of contrast but no definitive adenopathy
identified. No definitive thyroid or esophageal abnormalities noted.

Lungs/Pleura: Central airways are normal. No pneumothorax. Numerous
bilateral pulmonary nodules are identified, many of which are
cavitated. Several the nodule seen previously are smaller in the
interval. However, there are numerous new and larger nodules as
well. New pleural effusions are identified with associated
atelectasis.

Upper Abdomen: No acute abnormality.

Musculoskeletal: No chest wall mass or suspicious bone lesions
identified.
IMPRESSION: 1. Numerous bilateral pulmonary nodules and masslike regions,
several of which are cavitated. Several of the previously identified
nodules are smaller in the interval but there are other nodules
which are clearly new or larger. Given the history of heroin use,
this likely represents septic emboli. Other atypical infections
could have a similar appearance but are considered less likely given
history.
2. Bilateral pleural effusions are larger in the interval, small on
the right and small to moderate on the left. Portions of the left
effusion appear to be loculated.
3. No other changes.

## 2019-12-16 MED ORDER — DICLOFENAC SODIUM 1 % EX GEL
4.0000 g | Freq: Four times a day (QID) | CUTANEOUS | Status: DC
  Administered 2019-12-16 – 2020-01-09 (×28): 4 g via TOPICAL
  Filled 2019-12-16 (×2): qty 100

## 2019-12-16 MED ORDER — LIDOCAINE 5 % EX PTCH
2.0000 | MEDICATED_PATCH | CUTANEOUS | Status: DC
  Administered 2019-12-16: 2 via TRANSDERMAL
  Administered 2019-12-17: 1 via TRANSDERMAL
  Administered 2019-12-18 – 2020-01-09 (×17): 2 via TRANSDERMAL
  Filled 2019-12-16 (×4): qty 2
  Filled 2019-12-16: qty 1
  Filled 2019-12-16 (×24): qty 2

## 2019-12-16 MED ORDER — PANTOPRAZOLE SODIUM 40 MG PO TBEC
40.0000 mg | DELAYED_RELEASE_TABLET | Freq: Every day | ORAL | Status: DC
  Administered 2019-12-16 – 2020-01-16 (×31): 40 mg via ORAL
  Filled 2019-12-16 (×31): qty 1

## 2019-12-16 MED ORDER — KETOROLAC TROMETHAMINE 15 MG/ML IJ SOLN
15.0000 mg | Freq: Four times a day (QID) | INTRAMUSCULAR | Status: AC
  Administered 2019-12-16 – 2019-12-21 (×20): 15 mg via INTRAVENOUS
  Filled 2019-12-16 (×20): qty 1

## 2019-12-16 NOTE — Progress Notes (Signed)
Physical Therapy Treatment Patient Details Name: Amber [name] MRN: [id number] DOB: 03-12-1990 Today's Date: 12/16/2019    History of Present Illness 29 yo admitted 11/17 with septic shock due to MRSA endocarditis with IVDU fentanyl and heroin day of admission. PMhx: admission 10/23-10/26 for endocarditis left AMA, IVDU, cellulitis, depression    PT Comments    Pt sleeping on arrival and required increased time to wake for PT tx.  Pt voiced understanding of importance of OOB activity to improve respiratory function.  Pt able to increase gt distance this pm and HR stable.  Plan for continued acute PT to maximize functional gains before returning home.  Pt slated to be here until the 3rd of Jan.  Will inform supervising PT to order OT as she would benefit to improve her ability to care for herself.    Follow Up Recommendations  Home health PT;Supervision/Assistance - 24 hour     Equipment Recommendations  Rolling walker with 5" wheels;3in1 (PT)    Recommendations for Other Services       Precautions / Restrictions Precautions Precautions: Fall Precaution Comments: cortrak Restrictions Weight Bearing Restrictions: No    Mobility  Bed Mobility Overal bed mobility: Needs Assistance Bed Mobility: Supine to Sit     Supine to sit: Min guard Sit to supine: Supervision   General bed mobility comments: Increased time to move edge of bed with HOB bed elevated.  Transfers Overall transfer level: Needs assistance Equipment used: Rolling walker (2 wheeled) Transfers: Sit to/from Stand Sit to Stand: Min guard         General transfer comment: Pt required Cues for for hand placement to and from seated surface.  Ambulation/Gait Ambulation/Gait assistance: Min assist Gait Distance (Feet): 80 Feet Assistive device: Rolling walker (2 wheeled) Gait Pattern/deviations: Step-through pattern;Decreased stride length;Shuffle;Trunk flexed     General Gait Details: Pt continues to  require cues for body position in RW and posture.  Pt flexing forward due to coughing spell.  Able to increased gt distance.   Stairs             Wheelchair Mobility    Modified Rankin (Stroke Patients Only)       Balance Overall balance assessment: Needs assistance Sitting-balance support: Feet supported;No upper extremity supported Sitting balance-[name]: Fair       Standing balance-[name]: Poor                              Cognition Arousal/Alertness: Awake/alert Behavior During Therapy: Flat affect (appears depressed and voiced feeling depressed) Overall Cognitive Status: Impaired/Different from baseline Area of Impairment: Safety/judgement;Problem solving                         Safety/Judgement: Decreased awareness of safety   Problem Solving: Slow processing;Requires verbal cues        Exercises      General Comments        Pertinent Vitals/Pain Pain Assessment: 0-10 Pain Score: 6  Pain Location: L forearm, ribs cage when coughing Pain Descriptors / Indicators: Aching;Guarding;Stabbing Pain Intervention(s): Monitored during session;Repositioned    Home Living                      Prior Function            PT Goals (current goals can now be found in the care plan section) Acute Rehab PT Goals Patient  Stated Goal: to use the bathroom Potential to Achieve Goals: Fair Progress towards PT goals: Progressing toward goals    Frequency    Min 3X/week      PT Plan Current plan remains appropriate    Co-evaluation              AM-PAC PT "6 Clicks" Mobility   Outcome Measure  Help needed turning from your back to your side while in a flat bed without using bedrails?: None Help needed moving from lying on your back to sitting on the side of a flat bed without using bedrails?: A Little Help needed moving to and from a bed to a chair (including a wheelchair)?: A Little Help needed standing up  from a chair using your arms (e.g., wheelchair or bedside chair)?: A Little Help needed to walk in hospital room?: A Little Help needed climbing 3-5 steps with a railing? : A Little 6 Click Score: 19    End of Session Equipment Utilized During Treatment: Oxygen;Gait belt (3L [place]) Activity Tolerance: Patient tolerated treatment well Patient left: in bed;with call bell/phone within reach;with bed alarm set Nurse Communication: Mobility status PT Visit Diagnosis: Other abnormalities of gait and mobility (R26.89);Muscle weakness (generalized) (M62.81);Pain Pain - Right/Left: Left Pain - part of body: Arm     Time: 1610-9604 PT Time Calculation (min) (ACUTE ONLY): 30 min  Charges:  $Gait Training: 8-22 mins $Therapeutic Activity: 8-22 mins                     Bonney Leitz , PTA Acute Rehabilitation Services Pager [phone] Office [phone]     Amber [name] Artis Delay 12/16/2019, 3:27 PM

## 2019-12-16 NOTE — Progress Notes (Addendum)
°   ° °  [facility]  Date of Admission:  11/30/2019            Current antibiotics: Daptomycin   Reason for visit: Follow up on MRSA bacteremia   ASSESSMENT AND PLAN:   Amber [name] is a 29 y.o. 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  S/p angiovac procedure 11/22 and extubated 11/24.  # MRSA Bacteremia with septic pulmonary emboli complicated by TV IE s/p Angiovac 11/22 with severe TR # LFT elevation, normalizing # IV infiltration/left arm wound # HCV Ab positive  -- continue daptomycin -- continue doxy for pulmonary coverage in setting of septic emboli to lungs -- weekly CK (last done 12/2: 19) -- continue inpatient IV therapy.  Not a candidate for OPAT and patient agreeable to staying in patient -- end date for abx = 01/16/2020.  Pull picc after done -- will follow HCV RNA and make follow up if needed -- wound care managing arm wound.  May need debridement but likely not necessary right now -- will not continue to actively follow.  Please call as needed.    SUBJECTIVE:   Continues to feel stronger.  No acute complaints.  Aware of prior HCV diagnosis.  Waiting on RNA level.   Review of Systems: As noted above.  All other systems reviewed and are negative.   OBJECTIVE:   Allergies  Allergen Reactions   Bee Venom Anaphylaxis    Blood pressure 112/71, pulse 100, temperature 98.3 F (36.8 C), temperature source Oral, resp. rate 19, height 5\' 2"  (1.575 m), weight 48.4 kg, SpO2 97 %. Body mass index is 19.52 kg/m.  Physical Exam General: Thin appearing woman, no distress, looks better today.  HEENT: NCAT, on [place], NG tube Neck supple Skin: multiple track marks upper extremities.  See picture of eschar left arm.  Currently wrapped MSK: Able to move knees and ankles without pain Neuro: alert and awake.  She is able to follows commands, moving extremities, no focal  deficits  12/12/19      Lab Results & Microbiology Lab Results  Component Value Date   WBC 6.5 12/16/2019   HGB 8.1 (L) 12/16/2019   HCT 27.5 (L) 12/16/2019   MCV 97.9 12/16/2019   PLT 161 12/16/2019    Lab Results  Component Value Date   NA 133 (L) 12/16/2019   K 4.7 12/16/2019   CO2 30 12/16/2019   GLUCOSE 102 (H) 12/16/2019   BUN <5 (L) 12/16/2019   CREATININE 0.32 (L) 12/16/2019   CALCIUM 8.3 (L) 12/16/2019   GFRNONAA >60 12/16/2019   GFRAA >60 01/10/2019    Lab Results  Component Value Date   ALT 42 12/16/2019   AST 52 (H) 12/16/2019   ALKPHOS 122 12/16/2019   BILITOT 0.8 12/16/2019     I have reviewed the micro and lab results in Epic.  Imaging No results found.     14/03/2019 for Infectious Disease [facility] [phone] pager 12/16/2019, 9:13 AM   I have spent a total of 25 minutes with the patient reviewing hospital notes,  test results, labs and examining the patient as well as establishing an assessment and plan.

## 2019-12-16 NOTE — Progress Notes (Addendum)
[facility] PROGRESS NOTE  Amber [name] UDJ:[id number] DOB: September 23, 1990 DOA: 11/30/2019 PCP: Patient, No Pcp Per  Status: Remains inpatient appropriate because:Ongoing active pain requiring inpatient pain management, Ongoing diagnostic testing needed not appropriate for outpatient work up, Unsafe d/c plan, IV treatments appropriate due to intensity of illness or inability to take PO and Inpatient level of care appropriate due to severity of illness   Dispo: The patient is from: Home              Anticipated d/c is to: Home              Anticipated d/c date is: > 3 days              Patient currently is not medically stable to d/c.  Patient's primary reason for remaining in the hospital is to complete 6 weeks of IV antibiotics for her endocarditis with septic emboli well as possible early lumbar osteomyelitis.  She is currently requiring O2 and may be evolving pneumonia with current work-up in progress.  Code Status: Full Family Communication: Patient DVT prophylaxis: SCDs 2/2 recent thrombocytopenia due to sepsis Vaccination status: Has not been vaccinated against Covid  Foley catheter: No  HPI: 29 year old female patient with known IV drug abuse with known tricuspid MRSA endocarditis who signed out AMA x2 prior to this admission.  She presented with progressive infection.  On 10/21 through 10/23 she was admitted with fever with blood cultures positive for MRSA.  CT of the chest demonstrated septic emboli and edematous L4-5 facet joint (? Evolving osteomyelitis) as well as tricuspid vegetation on CT.  She left AMA.  She returned 2 hours later on 10/23 with severe back pain.  Underwent TEE that demonstrated a 1 cm tricuspid valve mass.  She was evaluated by CT surgery who recommended medical management.  Again she left AMA on 10/25.  Patient was staying with friends but presented back to the hospital due to increasing shortness of breath, weakness, fatigue malaise and nausea.  She  arrived in the parking lot obtunded and was found to have multiple needles in her car on 11/17.  In the ER she was hypotensive and started on pressors.  Hemoglobin was low and she was given a blood transfusion.  She was also started on empiric IV vancomycin for known MRSA infection  and was admitted to the critical care service.  She eventually was taken to the OR for angio VAC during this admission.  She has subsequently transitioned out of ICU to stepdown unit.  She has had an IV infiltration and has a superficial wound on her left forearm. ID continues to follow regarding the endocarditis and recommends a total of 6 weeks therapy be daptomycin.  She has continued to have hypoxemia and productive cough and on 11/30 ID added doxycycline.  She is reporting pleuritic chest pain as well.  She is also very malnourished and was started on tube feedings and developed mild electrolyte disturbances from refeeding syndrome which have resolved.  She has low-grade resting tachycardia with no significant abnormalities on echo and cardiology previously did not suspect cardiac etiology to her tachycardia.  Hepatitis C antibody has been positive; she is HIV negative.  She has had persistent elevation in her LFTs.  She is currently on methadone 20 mg 3 times a day but will need to be on no more than 40 mg per 24 hours at time of discharge to be eligible for outpatient methadone treatment.  Subjective: Patient alert and  somewhat anxious sitting in the bed.  Just hung up from speaking to mother on the phone.  Reporting pleuritic chest discomfort and less low back pain.  States left arm wound also very tender.  Reports productive cough and some shortness of breath at rest.  Objective: Vitals:   12/15/19 2359 12/16/19 0327  BP: 106/63 112/71  Pulse: 100 100  Resp: 20 19  Temp: 99.1 F (37.3 C) 98.3 F (36.8 C)  SpO2: 96% 97%    Intake/Output Summary (Last 24 hours) at 12/16/2019 1222 Last data filed at 12/16/2019  0342 Gross per 24 hour  Intake 1073.67 ml  Output 800 ml  Net 273.67 ml   Filed Weights   12/14/19 0100 12/15/19 0415 12/16/19 0529  Weight: 48.8 kg 49.2 kg 48.4 kg    Exam:  Constitutional: NAD, mildly anxious, comfortable Respiratory: Coarse to auscultation bilaterally on posterior exam somewhat diminished left mid field to base.  Expiratory rhonchi more audible on right.  3 L O2.  Normal respiratory effort at rest. No accessory muscle use.  Cardiovascular: Regular rate and rhythm, no murmurs / rubs / gallops. No extremity edema. 2+ pedal pulses. No carotid bruits.  Abdomen: no tenderness, Bowel sounds positive.  Core track tube in place for feeding Musculoskeletal: no clubbing / cyanosis. No joint deformity upper and lower extremities. Good ROM, no contractures. Normal muscle tone.  Skin: Left forearm dressing clean dry and intact. Neurologic: CN 2-12 grossly intact. Sensation intact, DTR normal. Strength 5/5 x all 4 extremities.  Psychiatric: Normal judgment and insight. Alert and oriented x 3. Normal mood.    Assessment/Plan: Acute problems: Septic shock 2/2 MRSA bacteremia and associated TV endocarditis status post angio VAC/abnormal lumbar imaging -12/3 is day 16 of 42 days of IV daptomycin via PICC inserted 11/30 (last dose should be 12/29) -Appreciate assistance of infectious disease team -Follow CK while on daptomycin -Had edema involving lumbar spine on initial imaging- 12/3 add lidocaine patch to help with discomfort-consider follow-up imaging prior to discharge  Acute hypoxemic respiratory failure (multifactorial) 1 acute sepsis 2 septic emboli 3 suspected evolving pneumonia/bilateral pleural effusions -Sepsis physiology has resolved -11/28 patient had abnormal chest x-ray concerning for possible consolidation left lung.  Due to persistent hypoxemia, low-grade fevers and productive cough on 11/30 infectious disease service added doxycycline -12/3 patient with  decreased lung sounds left base and reports of productive cough and pleuritic chest discomfort -12/3 CT chest revealed persistent stable pulmonary nodules and masslike region some which appear cavitated and are consistent with known septic emboli.  Also has enlarging bilateral pleural effusions larger on the left -Begin Toradol IV as well as Voltaren gel to anterior chest for pleuritic chest discomfort -Continue supportive care with pulmonary toileting and oxygen; no wheezing so no indication for bronchodilators -Encourage use of incentive spirometry  Persistent tachycardia/tricuspid valve insufficiency -Evaluated by CVTS and currently not a surgical candidate for cardiac valve replacement giving active IVDA prior to current admission (although was appropriate for angiovac surgery) -Echocardiogram this admission demonstrated only mild systolic LV dysfunction and cardiology felt this was an appropriate physiologic response to hypoalbuminemia/malnutrition, deconditioning and anemia. -Cardiology also stated that suppression of physiologic tachycardia with beta-blockers not advisable and utilization of diuretics would not be helpful and could possibly cause harm to the patient -If significant tachycardia persists/occurs please reconsult cardiology on 12/5  Severe protein calorie malnutrition with associated refeeding syndrome -Initial BMI 17 and with initiation of tube feedings patient had abnormalities with potassium, magnesium and phosphorus which  has resolved -Continue nocturnal tube feedings, regular diet Ensure feeding supplements Nutrition Problem: Inadequate oral intake Etiology: decreased appetite Signs/Symptoms: per patient/family report Interventions: Ensure Enlive (each supplement provides 350kcal and 20 grams of protein), Tube feeding Estimated body mass index is 19.52 kg/m as calculated from the following:   Height as of this encounter:  (1.575 m).   Weight as of this encounter:  48.4 kg.  Skin wound secondary to IV infiltration/right groin wound/right neck incision   -ID also following along regarding this wound.  No indication to change current management and no indication to consult surgery -Toradol added to assist with acute pain and patient on methadone Incision (Closed) 12/05/19 Neck Right (Active)  Date First Assessed/Time First Assessed: 12/05/19 1017   Location: Neck  Location Orientation: Right    Assessments 12/05/2019 12:00 PM 12/16/2019  7:24 AM  Dressing Type Gauze (Comment) Gauze (Comment)  Dressing Clean;Dry;Intact Clean;Dry;Intact  Dressing Change Frequency PRN PRN  Site / Wound Assessment Clean;Dry Clean;Dry  Margins -- Attached edges (approximated)  Closure -- None  Drainage Amount None None     No Linked orders to display     Incision (Closed) 12/05/19 Groin Right (Active)  Date First Assessed/Time First Assessed: 12/05/19 1017   Location: Groin  Location Orientation: Right    Assessments 12/05/2019 12:00 PM 12/15/2019  7:37 AM  Dressing Type Gauze (Comment) None  Dressing Dry;Intact;Clean Clean;Dry;Intact  Dressing Change Frequency PRN PRN  Site / Wound Assessment Clean;Dry Clean;Dry  Closure -- None  Drainage Amount None None  Treatment -- Other (Comment)     No Linked orders to display     Wound / Incision (Open or Dehisced) 12/10/19 Non-pressure wound Arm Left;Posterior;Lateral infiltration/phlebitis (Active)  Date First Assessed/Time First Assessed: 12/10/19 1915   Wound Type: Non-pressure wound  Location: Arm  Location Orientation: Left;Posterior;Lateral  Wound Description (Comments): infiltration/phlebitis    Assessments 12/10/2019 11:45 PM 12/16/2019  6:00 AM  Dressing Type Thin film;Non adherent;Gauze (Comment) Gauze (Comment)  Dressing Changed New Changed  Dressing Status Intact Clean;Dry;Intact  Dressing Change Frequency PRN Daily  Site / Wound Assessment Painful;Red;Bleeding Painful;Pink;Yellow  Margins -- Unattached  edges (unapproximated)  Closure None None  Drainage Amount Minimal Minimal  Drainage Description Serosanguineous Serosanguineous  Treatment Other (Comment) Cleansed;Packing (Saline gauze);Other (Comment)     No Linked orders to display    Chronic IV drug abuse with opiates/recent withdrawal syndrome -Currently on methadone 20 mg every 8 hours (60 mg per 24 hours ) and will need to be on no more than milligrams every 24 hours at time of discharge.  I did discuss this with the patient and she is agreeable to tapering closer to discharge -Currently on twice daily clonazepam-patient remains quite anxious will need to start further titration with plans to discontinue since this medication will not be prescribed at discharge -[facility] assisting with outpatient methadone clinic referrals      Other problems: Anemia of chronic disease/recent sepsis related thrombocytopenia -Thrombocytopenia has resolved -Hemoglobin stable around 8 -With initiation of NSAIDs daily Protonix has been started  Hyponatremia -Current sodium stable at 133  Hep C antibody positive with persistent transaminitis -Currently has nonobstructive transaminitis that has improved with improvement in nutrition -As of 12/3 albumin 2.0, AST 52, ALT normal at 42 and total bilirubin has been normal -HCVRNA quantitative pending   Data Reviewed: Basic Metabolic Panel: Recent Labs  Lab 12/10/19 0439 12/10/19 0439 12/11/19 0507 12/11/19 0507 12/12/19 0047 12/13/19 0039 12/14/19 0354 12/15/19 0325 12/16/19 0225  NA 132*   < > 134*   < > 131* 132* 132* 133* 133*  K 4.4   < > 4.7   < > 4.5 3.9 3.3* 5.1 4.7  CL 98   < > 98   < > 98 94* 95* 98 98  CO2 26   < > 27   < > 23 27 29 29 30   GLUCOSE 127*   < > 131*   < > 82 169* 137* 105* 102*  BUN 8   < > 9   < > 10 7 5* 5* <5*  CREATININE 0.36*   < > 0.39*   < > <0.30* 0.49 0.38* 0.35* 0.32*  CALCIUM 8.0*   < > 7.9*   < > 8.1* 8.3* 7.8* 8.0* 8.3*  MG 1.7  --  1.7  --  1.7  --   1.5* 1.7  --   PHOS 2.2*  --  2.4*  --  2.7 2.8 2.6  --   --    < > = values in this interval not displayed.   Liver Function Tests: Recent Labs  Lab 12/11/19 0507 12/12/19 0047 12/13/19 0039 12/15/19 0814 12/16/19 0225  AST  --  319* 305* 71* 52*  ALT  --  79* 87* 49* 42  ALKPHOS  --  147* 226* 140* 122  BILITOT 0.9 1.4* 1.3* 1.0 0.8  PROT  --  5.8* 6.3* 5.7* 5.7*  ALBUMIN  --  2.2* 2.3* 2.0* 2.0*   No results for input(s): LIPASE, AMYLASE in the last 168 hours. No results for input(s): AMMONIA in the last 168 hours. CBC: Recent Labs  Lab 12/12/19 0047 12/13/19 0039 12/14/19 0354 12/15/19 0325 12/16/19 0225  WBC 7.3 7.5 8.5 6.6 6.5  HGB 8.8* 9.4* 9.1* 8.8* 8.1*  HCT 28.8* 31.7* 30.5* 30.2* 27.5*  MCV 95.0 96.6 96.5 98.4 97.9  PLT 148* 166 189 167 161   Cardiac Enzymes: Recent Labs  Lab 12/11/19 0507 12/15/19 0325  CKTOTAL 50 19*   BNP (last 3 results) Recent Labs    11/30/19 1809  BNP 583.0*    ProBNP (last 3 results) No results for input(s): PROBNP in the last 8760 hours.  CBG: Recent Labs  Lab 12/15/19 1921 12/15/19 2359 12/16/19 0326 12/16/19 0741 12/16/19 1143  GLUCAP 124* 122* 105* 89 74    Recent Results (from the past 240 hour(s))  Culture, blood (routine x 2)     Status: None   Collection Time: 12/06/19  5:52 PM   Specimen: BLOOD  Result Value Ref Range Status   Specimen Description BLOOD SITE NOT SPECIFIED  Final   Special Requests   Final    BOTTLES DRAWN AEROBIC AND ANAEROBIC Blood Culture adequate volume   Culture   Final    NO GROWTH 5 DAYS Performed at [facility], [address] [place], [place]    Report Status 12/11/2019 FINAL  Final     Studies: No results found.  Scheduled Meds: . sodium chloride   Intravenous Once  . sodium chloride   Intravenous Once  . chlorhexidine  15 mL Mouth Rinse BID  . Chlorhexidine Gluconate Cloth  6 each Topical Daily  . clonazePAM  0.5 mg Oral BID  . collagenase    Topical Daily  . diclofenac Sodium  4 g Topical QID  . doxycycline  100 mg Oral Q12H  . feeding supplement  237 mL Oral TID BM  . feeding supplement (OSMOLITE 1.5 CAL)  600 mL Per Tube Q24H  . ketorolac  15 mg Intravenous Q6H  . lidocaine  2 patch Transdermal Q24H  . mouth rinse  15 mL Mouth Rinse q12n4p  . methadone  20 mg Oral Q8H  . pantoprazole  40 mg Oral Daily  . sodium chloride flush  10-40 mL Intracatheter Q12H  . sodium chloride flush  10-40 mL Intracatheter Q12H   Continuous Infusions: . sodium chloride 10 mL/hr at 12/05/19 0617  . sodium chloride    . sodium chloride Stopped (12/15/19 0324)  . DAPTOmycin (CUBICIN)  IV 500 mg (12/15/19 1931)    Principal Problem:   MRSA infection Active Problems:   Septic shock (HCC)   Opioid use with withdrawal (HCC)   Endocarditis of tricuspid valve   Palliative care encounter   Consultants:  PCCM  Infectious disease  CVTS  Cardiology  Palliative medicine  Procedures:  10/22 echocardiogram  10/24 TEE  11/17 complete echocardiogram  11/22 intraoperative TEE/application of angio [facility]  11/24 core track  11/29 Limited echocardiogram  Antibiotics: Anti-infectives (From admission, onward)   Start     Dose/Rate Route Frequency Ordered Stop   12/13/19 1115  doxycycline (VIBRA-TABS) tablet 100 mg        100 mg Oral Every 12 hours 12/13/19 1027 01/16/20 2359   12/07/19 2000  DAPTOmycin (CUBICIN) 500 mg in sodium chloride 0.9 % IVPB        500 mg 220 mL/hr over 30 Minutes Intravenous Daily 12/06/19 1345 01/16/20 2359   12/02/19 1400  vancomycin (VANCOREADY) IVPB 500 mg/100 mL  Status:  Discontinued        500 mg 100 mL/hr over 60 Minutes Intravenous Every 8 hours 12/02/19 1127 12/02/19 1131   12/02/19 1400  vancomycin (VANCOREADY) IVPB 750 mg/150 mL  Status:  Discontinued        750 mg 150 mL/hr over 60 Minutes Intravenous Every 8 hours 12/02/19 1131 12/06/19 1339   12/01/19 1200  vancomycin (VANCOREADY) IVPB 500  mg/100 mL  Status:  Discontinued        500 mg 100 mL/hr over 60 Minutes Intravenous Every 24 hours 11/30/19 1454 12/01/19 0946   12/01/19 1200  vancomycin (VANCOCIN) IVPB 1000 mg/200 mL premix  Status:  Discontinued        1,000 mg 200 mL/hr over 60 Minutes Intravenous Every 24 hours 12/01/19 0946 12/02/19 1127   11/30/19 1800  piperacillin-tazobactam (ZOSYN) IVPB 3.375 g  Status:  Discontinued        3.375 g 12.5 mL/hr over 240 Minutes Intravenous Every 8 hours 11/30/19 1454 12/02/19 0234   11/30/19 1115  piperacillin-tazobactam (ZOSYN) IVPB 3.375 g        3.375 g 100 mL/hr over 30 Minutes Intravenous  Once 11/30/19 1110 11/30/19 1306   11/30/19 1115  vancomycin (VANCOCIN) IVPB 1000 mg/200 mL premix        1,000 mg 200 mL/hr over 60 Minutes Intravenous  Once 11/30/19 1110 11/30/19 1337        Time spent: 35 minutes    [name] ANP  [facility] Pager [phone].

## 2019-12-16 NOTE — Plan of Care (Signed)
  Problem: Education: Goal: Knowledge of General Education information will improve Description: Including pain rating scale, medication(s)/side effects and non-pharmacologic comfort measures Outcome: Progressing   Problem: Clinical Measurements: Goal: Ability to maintain clinical measurements within normal limits will improve Outcome: Progressing   Problem: Clinical Measurements: Goal: Will remain free from infection Outcome: Progressing   Problem: Clinical Measurements: Goal: Diagnostic test results will improve Outcome: Progressing   Problem: Activity: Goal: Risk for activity intolerance will decrease Outcome: Progressing   Problem: Nutrition: Goal: Adequate nutrition will be maintained Outcome: Progressing   Problem: Pain Managment: Goal: General experience of comfort will improve Outcome: Progressing   Problem: Skin Integrity: Goal: Risk for impaired skin integrity will decrease Outcome: Progressing   

## 2019-12-16 NOTE — TOC Initial Note (Signed)
Transition of Care [facility]) - Initial/Assessment Note    Patient Details  Name: Amber [name] MRN: [id number] Date of Birth: 1990-09-21  Transition of Care [facility]) CM/SW Contact:    Leone Haven, RN Phone Number: 12/16/2019, 6:19 PM  Clinical Narrative:                 conts on cortrak tube feeds, hgb 8.1, for ct scan today for suspected PNA.  conts on 2 liters oxygen, TOC team to cont to follow.  Expected Discharge Plan: Home/Self Care Barriers to Discharge: Continued Medical Work up   Patient Goals and CMS Choice        Expected Discharge Plan and Services Expected Discharge Plan: Home/Self Care   Discharge Planning Services: CM Consult                                          Prior Living Arrangements/Services   Lives with:: Friends Patient language and need for interpreter reviewed:: Yes        Need for Family Participation in Patient Care: Yes (Comment) Care giver support system in place?: Yes (comment)   Criminal Activity/Legal Involvement Pertinent to Current Situation/Hospitalization: No - Comment as needed  Activities of Daily Living      Permission Sought/Granted                  Emotional Assessment       Orientation: : Oriented to Self, Oriented to Place, Oriented to  Time, Oriented to Situation Alcohol / Substance Use: Illicit Drugs Psych Involvement: No (comment)  Admission diagnosis:  Septic shock (HCC) [A41.9, R65.21] Patient Active Problem List   Diagnosis Date Noted  . MRSA infection 12/06/2019  . Opioid use with withdrawal (HCC)   . Endocarditis of tricuspid valve   . Palliative care encounter   . Septic shock (HCC) 11/30/2019  . Septic embolism (HCC) 11/05/2019  . AKI (acute kidney injury) (HCC) 11/05/2019  . Hyponatremia 11/05/2019  . Cellulitis of left foot 11/05/2019  . 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 Bacterial endocarditis 11/05/2019  . Acute bacterial endocarditis   . MRSA bacteremia   . Sepsis  (HCC) 11/03/2019  . Abscess of face 08/20/2018  . Facial cellulitis 11/21/2017  . Depression   . IV drug abuse (HCC)   . Suicidal ideation    PCP:  Patient, No Pcp Per Pharmacy:   [facility] [address], [place] - [address] [place] [address] Phone: [phone] Fax: [phone]     Social Determinants of Health (SDOH) Interventions    Readmission Risk Interventions No flowsheet data found.

## 2019-12-17 LAB — GLUCOSE, CAPILLARY
Glucose-Capillary: 102 mg/dL — ABNORMAL HIGH (ref 70–99)
Glucose-Capillary: 107 mg/dL — ABNORMAL HIGH (ref 70–99)
Glucose-Capillary: 108 mg/dL — ABNORMAL HIGH (ref 70–99)
Glucose-Capillary: 113 mg/dL — ABNORMAL HIGH (ref 70–99)
Glucose-Capillary: 93 mg/dL (ref 70–99)
Glucose-Capillary: 98 mg/dL (ref 70–99)

## 2019-12-17 LAB — CBC
HCT: 27.8 % — ABNORMAL LOW (ref 36.0–46.0)
Hemoglobin: 8.2 g/dL — ABNORMAL LOW (ref 12.0–15.0)
MCH: 28.6 pg (ref 26.0–34.0)
MCHC: 29.5 g/dL — ABNORMAL LOW (ref 30.0–36.0)
MCV: 96.9 fL (ref 80.0–100.0)
Platelets: 160 10*3/uL (ref 150–400)
RBC: 2.87 MIL/uL — ABNORMAL LOW (ref 3.87–5.11)
RDW: 21.1 % — ABNORMAL HIGH (ref 11.5–15.5)
WBC: 5.6 10*3/uL (ref 4.0–10.5)
nRBC: 0 % (ref 0.0–0.2)

## 2019-12-17 LAB — HCV RNA QUANT RFLX ULTRA OR GENOTYP
HCV RNA Qnt(log copy/mL): 5.029 log10 IU/mL
HepC Qn: 107000 IU/mL

## 2019-12-17 LAB — HEPATITIS C GENOTYPE

## 2019-12-17 MED ORDER — CALCIUM CARBONATE ANTACID 500 MG PO CHEW
1.0000 | CHEWABLE_TABLET | Freq: Three times a day (TID) | ORAL | Status: DC | PRN
  Administered 2019-12-17 – 2019-12-18 (×3): 200 mg via ORAL
  Filled 2019-12-17 (×3): qty 1

## 2019-12-17 MED ORDER — CALCIUM CARBONATE ANTACID 500 MG PO CHEW: 1.0000 | CHEWABLE_TABLET | Freq: Three times a day (TID) | ORAL | Status: DC

## 2019-12-17 NOTE — Progress Notes (Signed)
PROGRESS NOTE    Amber [name]  ZOX:[id number] DOB: 02/13/1990 DOA: 11/30/2019 PCP: Patient, No Pcp Per    Brief Narrative:  Amber [name] is a 29 y.o. F with IVDU and known tricuspid MRSA endocarditis signed out AMA x2 who presented with progressive infection.  10/21 - 10/23: Patient admitted with fever, blood cultures growing MRSA, CT chest showed septic emboli and edematous L4-5 facet joint, and tricuspid vegetation on CT.  ==> left AMA  10/23 - 10/25: Returned after 2 hours with severe back pain.  Underwent TEE that showed 1cm TV mass.  Seen by CT surgery who recommended medical management. ==> left again AMA  In the interim, patient was staying with friends for a few weeks.  Eventually had worsening shortness of breath, weakness, fatigue, malaise and nausea, and decided to return to the hospital.  Arrived in the parking lot obtunded with needle in car on 11/17.  In the ER, hypotensive and started on pressors and blood transfusion and vancomycin and admitted.  Taken to OR for angiovac on admission on 11/22. S/p extubation on 11/24  Amber [name] is currently on methadone 20 mg 3 times a day but will need to be on no more than 40 mg per 24 hours at time of discharge to be eligible for outpatient methadone treatment.  Consultants:   ID, palliative care, critical care  Procedures:  S/p Angiovac 11/22 CT Echo  Antimicrobials:   Doxycycline, daptomycin   Subjective: Mom at bedside. Amber [name] is coughing, some forcefully. No sob. No cp.  Objective: Vitals:   12/17/19 0323 12/17/19 0500 12/17/19 0810 12/17/19 1113  BP: 106/68  113/71 129/75  Pulse: 94  94 (!) 105  Resp: 20  (!) 22 18  Temp: 97.9 F (36.6 C)  97.8 F (36.6 C) 98.8 F (37.1 C)  TempSrc: Oral  Oral Oral  SpO2: 98%  100% 100%  Weight:  47.4 kg    Height:        Intake/Output Summary (Last 24 hours) at 12/17/2019 1311 Last data filed at 12/17/2019 0900 Gross per 24 hour  Intake 940.11 ml  Output --  Net 940.11 ml    Filed Weights   12/15/19 0415 12/16/19 0529 12/17/19 0500  Weight: 49.2 kg 48.4 kg 47.4 kg    Examination: nad cta no w/r/r Regular , mild tachy, s1/s2, no gallop Soft bening, +bs No edema Mood and affect appropriate in current setting   Data Reviewed: I have personally reviewed following labs and imaging studies  CBC: Recent Labs  Lab 12/13/19 0039 12/14/19 0354 12/15/19 0325 12/16/19 0225 12/17/19 0250  WBC 7.5 8.5 6.6 6.5 5.6  HGB 9.4* 9.1* 8.8* 8.1* 8.2*  HCT 31.7* 30.5* 30.2* 27.5* 27.8*  MCV 96.6 96.5 98.4 97.9 96.9  PLT 166 189 167 161 160   Basic Metabolic Panel: Recent Labs  Lab 12/11/19 0507 12/11/19 0507 12/12/19 0047 12/13/19 0039 12/14/19 0354 12/15/19 0325 12/16/19 0225  NA 134*   < > 131* 132* 132* 133* 133*  K 4.7   < > 4.5 3.9 3.3* 5.1 4.7  CL 98   < > 98 94* 95* 98 98  CO2 27   < > 23 27 29 29 30   GLUCOSE 131*   < > 82 169* 137* 105* 102*  BUN 9   < > 10 7 5* 5* <5*  CREATININE 0.39*   < > <0.30* 0.49 0.38* 0.35* 0.32*  CALCIUM 7.9*   < > 8.1* 8.3* 7.8* 8.0* 8.3*  MG  1.7  --  1.7  --  1.5* 1.7  --   PHOS 2.4*  --  2.7 2.8 2.6  --   --    < > = values in this interval not displayed.   GFR: Estimated Creatinine Clearance: 77.6 mL/min (A) (by C-G formula based on SCr of 0.32 mg/dL (L)). Liver Function Tests: Recent Labs  Lab 12/11/19 0507 12/12/19 0047 12/13/19 0039 12/15/19 0814 12/16/19 0225  AST  --  319* 305* 71* 52*  ALT  --  79* 87* 49* 42  ALKPHOS  --  147* 226* 140* 122  BILITOT 0.9 1.4* 1.3* 1.0 0.8  PROT  --  5.8* 6.3* 5.7* 5.7*  ALBUMIN  --  2.2* 2.3* 2.0* 2.0*   No results for input(s): LIPASE, AMYLASE in the last 168 hours. No results for input(s): AMMONIA in the last 168 hours. Coagulation Profile: No results for input(s): INR, PROTIME in the last 168 hours. Cardiac Enzymes: Recent Labs  Lab 12/11/19 0507 12/15/19 0325  CKTOTAL 50 19*   BNP (last 3 results) No results for input(s): PROBNP in the last  8760 hours. HbA1C: No results for input(s): HGBA1C in the last 72 hours. CBG: Recent Labs  Lab 12/16/19 1935 12/17/19 0015 12/17/19 0322 12/17/19 0812 12/17/19 1111  GLUCAP 129* 93 108* 113* 107*   Lipid Profile: No results for input(s): CHOL, HDL, LDLCALC, TRIG, CHOLHDL, LDLDIRECT in the last 72 hours. Thyroid Function Tests: No results for input(s): TSH, T4TOTAL, FREET4, T3FREE, THYROIDAB in the last 72 hours. Anemia Panel: No results for input(s): VITAMINB12, FOLATE, FERRITIN, TIBC, IRON, RETICCTPCT in the last 72 hours. Sepsis Labs: No results for input(s): PROCALCITON, LATICACIDVEN in the last 168 hours.  No results found for this or any previous visit (from the past 240 hour(s)).       Radiology Studies: CT CHEST WO CONTRAST  Result Date: 12/16/2019 CLINICAL DATA:  Persistent abnormal x-ray. Shortness of breath and chest pain. A CT scan from November 03, 2019 demonstrated signs of probable septic emboli. EXAM: CT CHEST WITHOUT CONTRAST TECHNIQUE: Multidetector CT imaging of the chest was performed following the standard protocol without IV contrast. COMPARISON:  Chest x-ray December 11, 2019. CT scan of the chest November 03, 2019. FINDINGS: Cardiovascular: No significant vascular findings. Normal heart size. No pericardial effusion. Mediastinum/Nodes: A right PICC line terminates near the caval atrial junction. A feeding tube terminates below today's film. Bilateral pleural effusions are larger in the interval, small on the right and small to moderate on the left. Portions of the left effusion appear to be loculated in non dependent locations. No pericardial effusion definitely seen. Evaluation for adenopathy is limited due to lack of contrast but no definitive adenopathy identified. No definitive thyroid or esophageal abnormalities noted. Lungs/Pleura: Central airways are normal. No pneumothorax. Numerous bilateral pulmonary nodules are identified, many of which are cavitated.  Several the nodule seen previously are smaller in the interval. However, there are numerous new and larger nodules as well. New pleural effusions are identified with associated atelectasis. Upper Abdomen: No acute abnormality. Musculoskeletal: No chest wall mass or suspicious bone lesions identified. IMPRESSION: 1. Numerous bilateral pulmonary nodules and masslike regions, several of which are cavitated. Several of the previously identified nodules are smaller in the interval but there are other nodules which are clearly new or larger. Given the history of heroin use, this likely represents septic emboli. Other atypical infections could have a similar appearance but are considered less likely given history. 2. Bilateral  pleural effusions are larger in the interval, small on the right and small to moderate on the left. Portions of the left effusion appear to be loculated. 3. No other changes. Electronically Signed   By: Gerome Sam III M.D   On: 12/16/2019 13:55        Scheduled Meds: . chlorhexidine  15 mL Mouth Rinse BID  . Chlorhexidine Gluconate Cloth  6 each Topical Daily  . clonazePAM  0.5 mg Oral BID  . collagenase   Topical Daily  . diclofenac Sodium  4 g Topical QID  . doxycycline  100 mg Oral Q12H  . feeding supplement  237 mL Oral TID BM  . feeding supplement (OSMOLITE 1.5 CAL)  600 mL Per Tube Q24H  . ketorolac  15 mg Intravenous Q6H  . lidocaine  2 patch Transdermal Q24H  . mouth rinse  15 mL Mouth Rinse q12n4p  . methadone  20 mg Oral Q8H  . pantoprazole  40 mg Oral Daily  . sodium chloride flush  10-40 mL Intracatheter Q12H  . sodium chloride flush  10-40 mL Intracatheter Q12H   Continuous Infusions: . sodium chloride 10 mL/hr at 12/05/19 0617  . sodium chloride Stopped (12/15/19 0324)  . DAPTOmycin (CUBICIN)  IV 500 mg (12/16/19 2042)    Assessment & Plan:   Principal Problem:   MRSA infection Active Problems:   Septic shock (HCC)   Opioid use with withdrawal  (HCC)   Endocarditis of tricuspid valve   Palliative care encounter   Septic shock due to TV MRSA endocarditis s/p Angiovac Septic emboli due to MRSA endocarditis Acute hypoxic respiratory failure due to Septic shock Afebrile, no leukocytosis, sepsis resolved. Hemodynamically stable Still weak unable to stand ID following 12/4-continue daptomycin -day 17 of 42 days via PICC inserted on 11/30. (last dose should be 12/29) Continue doxycycline for pulmonary coverage in setting of septic emboli to the lungs-seen on CT Weekly CK. Not a candidate for O AT and patient agreeable to stay inpatient Wound care management for left arm, IV infiltrated.      Severe protein calorie malnutrition Refeeding syndrome Anasarca Tachycardia Patient with BMI 17 on admission, did experience some refeeding syndrome, but K, mag, and Phos resolved, and edema actually improving.  Mild ascites on ultrasound  -Amber [name] has sinus tachycardia more related to hypoalbuminemia, deconditioning, malnutrition anemia Echo with normal LV function, mildly reduced heart function -Avoid diuretics as possibly harmful 12/4-no need for beta-blockers for tachycardia as it seems to to patient's low albumin status, physical stamina and hopefully as these improve so will her tachycardia.   Encouraged p.o. 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  LFTs stable today. 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   Methadone at 60 mg per day is likely not an outpatient/discharge option (methadone clinics likely only able  to start at lower dose).  Amber [name] will need to taper to 30-40 or replace this Suboxone prior to discharge.    TOC assisting with outpatient methadone clinic referrals     Anemia of chronic disease Thrombocytopenia due to septic shock Transfused 1 unit PRBCs on 11/17, 1 units on 11/22; transfused 1 pack platelets 11/21; Transfused again 11/28 Platelets are slowly improving No evidence of hemolysis Continue to monitor Transfuse if Hg <8   Hyponatremia Mild, asymptomatic      PT recommends home health      DVT prophylaxis: SCD Code Status: Full Family Communication: None at bedside  Status is: Inpatient  Remains inpatient appropriate because:IV treatments appropriate due to intensity of illness or inability to take PO   Dispo: The patient is from: Home              Anticipated d/c is to: Home vs SNF Amber [name]              Anticipated d/c date is: > 3 days              Patient currently is not medically stable to d/c.Pt with h/xo IVDU, with Endocarditis, needing extended abx tx.Still with FT.             LOS: 17 days   Time spent: 45 minutes with more than 50% on COC    Lynn Ito, MD [facility] Pager [phone]  If 7PM-7AM, please contact night-coverage [email or web address] Password [email or web address] 12/17/2019, 1:11 PM

## 2019-12-17 NOTE — Plan of Care (Signed)
  Problem: Health Behavior/Discharge Planning: Goal: Ability to manage health-related needs will improve Outcome: Progressing   Problem: Clinical Measurements: Goal: Ability to maintain clinical measurements within normal limits will improve Outcome: Progressing   Problem: Clinical Measurements: Goal: Diagnostic test results will improve Outcome: Progressing   Problem: Activity: Goal: Risk for activity intolerance will decrease Outcome: Progressing   Problem: Nutrition: Goal: Adequate nutrition will be maintained Outcome: Progressing   Problem: Pain Managment: Goal: General experience of comfort will improve Outcome: Progressing   Problem: Skin Integrity: Goal: Risk for impaired skin integrity will decrease Outcome: Progressing

## 2019-12-18 LAB — CBC
HCT: 26.8 % — ABNORMAL LOW (ref 36.0–46.0)
Hemoglobin: 7.9 g/dL — ABNORMAL LOW (ref 12.0–15.0)
MCH: 28.4 pg (ref 26.0–34.0)
MCHC: 29.5 g/dL — ABNORMAL LOW (ref 30.0–36.0)
MCV: 96.4 fL (ref 80.0–100.0)
Platelets: 173 10*3/uL (ref 150–400)
RBC: 2.78 MIL/uL — ABNORMAL LOW (ref 3.87–5.11)
RDW: 21 % — ABNORMAL HIGH (ref 11.5–15.5)
WBC: 5.4 10*3/uL (ref 4.0–10.5)
nRBC: 0 % (ref 0.0–0.2)

## 2019-12-18 LAB — GLUCOSE, CAPILLARY
Glucose-Capillary: 132 mg/dL — ABNORMAL HIGH (ref 70–99)
Glucose-Capillary: 78 mg/dL (ref 70–99)
Glucose-Capillary: 80 mg/dL (ref 70–99)
Glucose-Capillary: 86 mg/dL (ref 70–99)
Glucose-Capillary: 90 mg/dL (ref 70–99)
Glucose-Capillary: 98 mg/dL (ref 70–99)

## 2019-12-18 MED ORDER — ALUM & MAG HYDROXIDE-SIMETH 200-200-20 MG/5ML PO SUSP
15.0000 mL | ORAL | Status: DC | PRN
  Administered 2019-12-18 (×2): 15 mL via ORAL
  Filled 2019-12-18 (×2): qty 30

## 2019-12-18 NOTE — Plan of Care (Signed)
  Problem: Education: Goal: Knowledge of General Education information will improve Description: Including pain rating scale, medication(s)/side effects and non-pharmacologic comfort measures Outcome: Progressing   Problem: Health Behavior/Discharge Planning: Goal: Ability to manage health-related needs will improve Outcome: Progressing   Problem: Clinical Measurements: Goal: Ability to maintain clinical measurements within normal limits will improve Outcome: Progressing   Problem: Clinical Measurements: Goal: Will remain free from infection Outcome: Progressing   Problem: Clinical Measurements: Goal: Diagnostic test results will improve Outcome: Progressing   Problem: Activity: Goal: Risk for activity intolerance will decrease Outcome: Progressing   Problem: Nutrition: Goal: Adequate nutrition will be maintained Outcome: Progressing   Problem: Pain Managment: Goal: General experience of comfort will improve Outcome: Progressing   Problem: Skin Integrity: Goal: Risk for impaired skin integrity will decrease Outcome: Progressing   

## 2019-12-18 NOTE — Plan of Care (Signed)

## 2019-12-18 NOTE — Progress Notes (Signed)
Patient on the LLS team.  No current complaints.  Patient comfortable in bed.   Marlin Canary DO

## 2019-12-19 LAB — GLUCOSE, CAPILLARY
Glucose-Capillary: 132 mg/dL — ABNORMAL HIGH (ref 70–99)
Glucose-Capillary: 180 mg/dL — ABNORMAL HIGH (ref 70–99)
Glucose-Capillary: 53 mg/dL — ABNORMAL LOW (ref 70–99)
Glucose-Capillary: 55 mg/dL — ABNORMAL LOW (ref 70–99)
Glucose-Capillary: 65 mg/dL — ABNORMAL LOW (ref 70–99)
Glucose-Capillary: 66 mg/dL — ABNORMAL LOW (ref 70–99)
Glucose-Capillary: 72 mg/dL (ref 70–99)
Glucose-Capillary: 87 mg/dL (ref 70–99)
Glucose-Capillary: 97 mg/dL (ref 70–99)
Glucose-Capillary: 99 mg/dL (ref 70–99)

## 2019-12-19 LAB — CBC
HCT: 27.2 % — ABNORMAL LOW (ref 36.0–46.0)
Hemoglobin: 7.8 g/dL — ABNORMAL LOW (ref 12.0–15.0)
MCH: 27.4 pg (ref 26.0–34.0)
MCHC: 28.7 g/dL — ABNORMAL LOW (ref 30.0–36.0)
MCV: 95.4 fL (ref 80.0–100.0)
Platelets: 179 10*3/uL (ref 150–400)
RBC: 2.85 MIL/uL — ABNORMAL LOW (ref 3.87–5.11)
RDW: 21.1 % — ABNORMAL HIGH (ref 11.5–15.5)
WBC: 6.5 10*3/uL (ref 4.0–10.5)
nRBC: 0 % (ref 0.0–0.2)

## 2019-12-19 MED ORDER — DEXTROSE 50 % IV SOLN
1.0000 | Freq: Two times a day (BID) | INTRAVENOUS | Status: DC | PRN
  Administered 2019-12-19 – 2019-12-27 (×3): 50 mL via INTRAVENOUS
  Filled 2019-12-19 (×3): qty 50

## 2019-12-19 NOTE — Consult Note (Signed)
WOC Nurse wound follow up Patient receiving care in [facility] 2C11 Wound type: IV inflitrate Wound bed: 75% eschar 25% red moist with yellow globules Drainage (amount, consistency, odor) Serosanguinous on dressing.  Periwound: Erytmatous Dressing procedure/placement/frequency:Continue current orders for [employer] daily.  WOC will continue to follow weekly.  Re-consult if needed.  Renaldo Reel Katrinka Blazing, MSN, RN, CMSRN, Angus Seller, [facility] Wound Treatment Associate Pager [phone]

## 2019-12-19 NOTE — Progress Notes (Signed)
OT Cancellation Note  Patient Details Name: Amber [name] MRN: [id number] DOB: October 07, 1990   Cancelled Treatment:    Reason Eval/Treat Not Completed: Other (comment). Just worked with PT and laying down for nap. Will return as schedule allows.  Cleona Doubleday M Starnisha Batrez [place] MSOT, OTR/L Acute Rehab Pager: [phone] Office: [phone] 12/19/2019, 2:21 PM

## 2019-12-19 NOTE — Progress Notes (Signed)
Physical Therapy Treatment Patient Details Name: Amber [name] MRN: [id number] DOB: August 25, 1990 Today's Date: 12/19/2019    History of Present Illness 29 yo admitted 11/17 with septic shock due to MRSA endocarditis with IVDU fentanyl and heroin day of admission. PMhx: admission 10/23-10/26 for endocarditis left AMA, IVDU, cellulitis, depression    PT Comments    Pt standing at [facility] on entry, reports she was going back to bed but with encouragement pt agreeable to short walk before taking a nap. Pt is min guard for ambulation with RW, improved posture today. Pt min guard for stand to sit and min A for return to supine as pt asked her aunt to help her get her R leg back in the bed. D/c plans remain appropriate at this time. PT will continue to follow acutely.     Follow Up Recommendations  Home health PT;Supervision/Assistance - 24 hour     Equipment Recommendations  Rolling walker with 5" wheels;3in1 (PT)    Recommendations for Other Services OT consult     Precautions / Restrictions Precautions Precautions: Fall Precaution Comments: cortrak    Mobility  Bed Mobility Overal bed mobility: Needs Assistance Bed Mobility: Sit to Supine       Sit to supine: Min assist   General bed mobility comments: aunt helped her with getting R LE into bed, could have done it herself but asked for help  Transfers Overall transfer level: Needs assistance Equipment used: Rolling walker (2 wheeled) Transfers: Sit to/from Stand Sit to Stand: Min guard         General transfer comment: cues for hand placement with power up to RW  Ambulation/Gait Ambulation/Gait assistance: Min guard Gait Distance (Feet): 100 Feet Assistive device: Rolling walker (2 wheeled) Gait Pattern/deviations: Step-through pattern;Decreased stride length;Shuffle;Trunk flexed Gait velocity: slowed Gait velocity interpretation: <1.8 ft/sec, indicate of risk for recurrent falls General Gait Details: min guard for  safety, better posture today until she started coughing and then she was more flexed over the RW      Balance Overall balance assessment: Needs assistance Sitting-balance support: Feet supported;No upper extremity supported Sitting balance-[name]: Fair     Standing balance support: Single extremity supported;Bilateral upper extremity supported Standing balance-[name]: Poor Standing balance comment: single and bil UE support in standing with need for RW to step                            Cognition Arousal/Alertness: Awake/alert Behavior During Therapy: Flat affect Overall Cognitive Status: Impaired/Different from baseline Area of Impairment: Safety/judgement;Problem solving                         Safety/Judgement: Decreased awareness of safety   Problem Solving: Slow processing;Requires verbal cues General Comments: continues to require cuing for safety          General Comments General comments (skin integrity, edema, etc.): max HR noted 123bpm with ambulation, ambulated on 3L O2 via [place] O2 probe on toe with poor pleth with walking and stopped working Charity fundraiser to place new one, no symptoms of oxygen desaturation with ambulation       Pertinent Vitals/Pain Pain Assessment: 0-10 Pain Score: 6  Pain Location: ribs cage when coughing Pain Descriptors / Indicators: Aching;Guarding;Stabbing Pain Intervention(s): Heat applied;Limited activity within patient's tolerance;Premedicated before session;Repositioned           PT Goals (current goals can now be found in the care  plan section) Acute Rehab PT Goals Patient Stated Goal: to use the bathroom PT Goal Formulation: With patient Time For Goal Achievement: 12/23/19 Potential to Achieve Goals: Fair Progress towards PT goals: Progressing toward goals    Frequency    Min 3X/week      PT Plan Current plan remains appropriate       AM-PAC PT "6 Clicks" Mobility   Outcome Measure  Help  needed turning from your back to your side while in a flat bed without using bedrails?: None Help needed moving from lying on your back to sitting on the side of a flat bed without using bedrails?: A Little Help needed moving to and from a bed to a chair (including a wheelchair)?: A Little Help needed standing up from a chair using your arms (e.g., wheelchair or bedside chair)?: A Little Help needed to walk in hospital room?: A Little Help needed climbing 3-5 steps with a railing? : A Little 6 Click Score: 19    End of Session Equipment Utilized During Treatment: Oxygen;Gait belt (3L [place]) Activity Tolerance: Patient tolerated treatment well Patient left: in bed;with call bell/phone within reach;with family/visitor present Nurse Communication: Mobility status PT Visit Diagnosis: Other abnormalities of gait and mobility (R26.89);Muscle weakness (generalized) (M62.81);Pain Pain - Right/Left: Right Pain - part of body:  (ribs)     Time: 6546-5035 PT Time Calculation (min) (ACUTE ONLY): 20 min  Charges:  $Therapeutic Exercise: 8-22 mins                     Darcelle Herrada B. Beverely Risen PT, DPT Acute Rehabilitation Services Pager [phone] Office [phone]    Elon Alas Fleet 12/19/2019, 2:26 PM

## 2019-12-19 NOTE — Progress Notes (Signed)
[facility] PROGRESS NOTE  Amber [name]:[id number] DOB: September 23, 1990 DOA: 11/30/2019 PCP: Patient, No Pcp Per  Status: Remains inpatient appropriate because:Ongoing active pain requiring inpatient pain management, Ongoing diagnostic testing needed not appropriate for outpatient work up, Unsafe d/c plan, IV treatments appropriate due to intensity of illness or inability to take PO and Inpatient level of care appropriate due to severity of illness   Dispo: The patient is from: Home              Anticipated d/c is to: Home              Anticipated d/c date is: > 3 days              Patient currently is not medically stable to d/c.  Patient's primary reason for remaining in the hospital is to complete 6 weeks of IV antibiotics for her endocarditis with septic emboli well as possible early lumbar osteomyelitis.  She is currently requiring O2 and may be evolving pneumonia with current work-up in progress.  Code Status: Full Family Communication: Patient DVT prophylaxis: SCDs 2/2 recent thrombocytopenia due to sepsis Vaccination status: Has not been vaccinated against Covid  Foley catheter: No  HPI: 29 year old female patient with known IV drug abuse with known tricuspid MRSA endocarditis who signed out AMA x2 prior to this admission.  She presented with progressive infection.  On 10/21 through 10/23 she was admitted with fever with blood cultures positive for MRSA.  CT of the chest demonstrated septic emboli and edematous L4-5 facet joint (? Evolving osteomyelitis) as well as tricuspid vegetation on CT.  She left AMA.  She returned 2 hours later on 10/23 with severe back pain.  Underwent TEE that demonstrated a 1 cm tricuspid valve mass.  She was evaluated by CT surgery who recommended medical management.  Again she left AMA on 10/25.  Patient was staying with friends but presented back to the hospital due to increasing shortness of breath, weakness, fatigue malaise and nausea.  She  arrived in the parking lot obtunded and was found to have multiple needles in her car on 11/17.  In the ER she was hypotensive and started on pressors.  Hemoglobin was low and she was given a blood transfusion.  She was also started on empiric IV vancomycin for known MRSA infection  and was admitted to the critical care service.  She eventually was taken to the OR for angio VAC during this admission.  She has subsequently transitioned out of ICU to stepdown unit.  She has had an IV infiltration and has a superficial wound on her left forearm. ID continues to follow regarding the endocarditis and recommends a total of 6 weeks therapy be daptomycin.  She has continued to have hypoxemia and productive cough and on 11/30 ID added doxycycline.  She is reporting pleuritic chest pain as well.  She is also very malnourished and was started on tube feedings and developed mild electrolyte disturbances from refeeding syndrome which have resolved.  She has low-grade resting tachycardia with no significant abnormalities on echo and cardiology previously did not suspect cardiac etiology to her tachycardia.  Hepatitis C antibody has been positive; she is HIV negative.  She has had persistent elevation in her LFTs.  She is currently on methadone 20 mg 3 times a day but will need to be on no more than 40 mg per 24 hours at time of discharge to be eligible for outpatient methadone treatment.  Subjective: Patient alert and  in reporting near resolution of pleuritic chest pain with the addition of medications last week.  Continues to have significant pain with removal of dressings on left arm.  Objective: Vitals:   12/19/19 0314 12/19/19 0723  BP: 113/74 123/85  Pulse: (!) 104 98  Resp: 17 16  Temp: 98.5 F (36.9 C) 98 F (36.7 C)  SpO2: 100% 100%    Intake/Output Summary (Last 24 hours) at 12/19/2019 1006 Last data filed at 12/19/2019 0314 Gross per 24 hour  Intake 670.2 ml  Output --  Net 670.2 ml   Filed Weights    12/16/19 0529 12/17/19 0500 12/19/19 0314  Weight: 48.4 kg 47.4 kg 47.5 kg    Exam:  Constitutional: NAD, calm, mildly comfortable on [place] recent wound care to left upper extremity Respiratory: Coarse to auscultation bilaterally overall improved air movement.  Decreased to 2 L O2.  Normal respiratory effort at rest. No accessory muscle use.  Cardiovascular: Regular rate and rhythm, no murmurs / rubs / gallops. No extremity edema.  Good capillary refill Abdomen: no tenderness, Bowel sounds positive.  Core track tube in place for feeding-patient reports improved appetite Musculoskeletal: no clubbing / cyanosis. Good ROM, no contractures. Normal muscle tone.  Skin: Left forearm dressing clean dry and intact. Neurologic: CN 2-12 grossly intact. Sensation intact, DTR normal. Strength 5/5 x all 4 extremities.  Psychiatric: Normal judgment and insight. Alert and oriented x 3. Normal mood.    Assessment/Plan: Acute problems: Septic shock 2/2 MRSA bacteremia and associated TV endocarditis status post angio VAC/abnormal lumbar imaging -12/6 is day 19/42 days of IV daptomycin via PICC inserted 11/30 (last dose should be 12/29) -Appreciate assistance of infectious disease team -Follow CK while on daptomycin -Had edema involving lumbar spine on initial imaging- 12/3 added lidocaine patch to help with discomfort improvement in back pain reported-consider follow-up imaging prior to discharge  Acute hypoxemic respiratory failure (multifactorial) 1 acute sepsis 2 septic emboli 3 suspected evolving pneumonia/bilateral pleural effusions -Sepsis physiology has resolved -11/28 patient had abnormal chest x-ray concerning for possible consolidation left lung.  Due to persistent hypoxemia, low-grade fevers and productive cough on 11/30 infectious disease service added doxycycline -12/3 patient with decreased lung sounds left base and reports of productive cough and pleuritic chest discomfort -12/3 CT  chest revealed persistent stable pulmonary nodules and masslike region some which appear cavitated and are consistent with known septic emboli.  Also has enlarging bilateral pleural effusions larger on the left -Continue Toradol IV (ordered scheduled x5 days) transition to prn ibuprofen after completed.  Continue Voltaren gel to anterior chest for pleuritic chest discomfort-reporting improvement and pleuritic chest pain -Continue supportive care with pulmonary toileting and oxygen; no wheezing so no indication for bronchodilators -Encourage use of incentive spirometry  Persistent tachycardia/tricuspid valve insufficiency -Evaluated by CVTS and currently not a surgical candidate for cardiac valve replacement giving active IVDA prior to current admission (although was appropriate for angiovac surgery) -Echocardiogram this admission demonstrated only mild systolic LV dysfunction and cardiology felt this was an appropriate physiologic response to hypoalbuminemia/malnutrition, deconditioning and anemia. -Cardiology also stated that suppression of physiologic tachycardia with beta-blockers not advisable and utilization of diuretics would not be helpful and could possibly cause harm to the patient  Severe protein calorie malnutrition with associated refeeding syndrome -Initial BMI 17 and with initiation of tube feedings patient had abnormalities with potassium, magnesium and phosphorus which has resolved -Admission weight has gone up to nearly 105 pounds with a BMI now of 19.15 -Continue nocturnal  tube feedings, regular diet plus Ensure feeding supplements Nutrition Problem: Inadequate oral intake Etiology: decreased appetite Signs/Symptoms: per patient/family report Interventions: Ensure Enlive (each supplement provides 350kcal and 20 grams of protein), Tube feeding Estimated body mass index is 19.15 kg/m as calculated from the following:   Height as of this encounter: 5\' 2"  (1.575 m).   Weight as of  this encounter: 47.5 kg.  Skin wound secondary to IV infiltration/right groin wound/right neck incision   -ID also following along regarding this wound.  No indication to change current management and no indication to consult surgery -Toradol added to assist with acute pain and patient on methadone Incision (Closed) 12/05/19 Neck Right (Active)  Date First Assessed/Time First Assessed: 12/05/19 1017   Location: Neck  Location Orientation: Right    Assessments 12/05/2019 12:00 PM 12/19/2019  3:14 AM  Dressing Type Gauze (Comment) None  Dressing Clean;Dry;Intact --  Dressing Change Frequency PRN --  Site / Wound Assessment Clean;Dry Clean;Dry  Margins -- Attached edges (approximated)  Closure -- None  Drainage Amount None None     No Linked orders to display     Incision (Closed) 12/05/19 Groin Right (Active)  Date First Assessed/Time First Assessed: 12/05/19 1017   Location: Groin  Location Orientation: Right    Assessments 12/05/2019 12:00 PM 12/18/2019  7:46 AM  Dressing Type Gauze (Comment) None  Dressing Dry;Intact;Clean --  Dressing Change Frequency PRN --  Site / Wound Assessment Clean;Dry --  Drainage Amount None --     No Linked orders to display     Wound / Incision (Open or Dehisced) 12/10/19 Non-pressure wound Arm Left;Posterior;Lateral infiltration/phlebitis (Active)  Date First Assessed/Time First Assessed: 12/10/19 1915   Wound Type: Non-pressure wound  Location: Arm  Location Orientation: Left;Posterior;Lateral  Wound Description (Comments): infiltration/phlebitis    Assessments 12/10/2019 11:45 PM 12/19/2019  6:30 AM  Dressing Type Thin film;Non adherent;Gauze (Comment) Gauze (Comment)  Dressing Changed New Changed  Dressing Status Intact Clean;Dry;Intact  Dressing Change Frequency PRN Daily  Site / Wound Assessment Painful;Red;Bleeding Black;Pink;Red;Yellow;Painful  Margins -- Unattached edges (unapproximated)  Closure None None  Drainage Amount Minimal Minimal   Drainage Description Serosanguineous Serosanguineous  Treatment Other (Comment) Cleansed     No Linked orders to display    Chronic IV drug abuse with opiates/recent withdrawal syndrome -Currently on methadone 20 mg every 8 hours (60 mg per 24 hours ) and will need to be on no more than milligrams every 24 hours at time of discharge.  I did discuss this with the patient and she is agreeable to tapering closer to discharge -Currently on twice daily clonazepam-patient remains quite anxious will need to start further titration with plans to discontinue since this medication will not be prescribed at discharge [facility]      Other problems: Anemia of chronic disease/recent sepsis related thrombocytopenia -Thrombocytopenia has resolved -Hemoglobin stable around 8 -With initiation of NSAIDs daily Protonix has been started  Hyponatremia -Current sodium stable at 133  Hep C antibody positive with persistent transaminitis -Currently has nonobstructive transaminitis that has improved with improvement in nutrition -As of 12/3 albumin 2.0, AST 52, ALT normal at 42 and total bilirubin has been normal -HCVRNA quantitative pending   Data Reviewed: Basic Metabolic Panel: Recent Labs  Lab 12/13/19 0039 12/14/19 0354 12/15/19 0325 12/16/19 0225  NA 132* 132* 133* 133*  K 3.9 3.3* 5.1 4.7  CL 94* 95* 98 98  CO2 27 29 29 30   GLUCOSE  169* 137* 105* 102*  BUN 7 5* 5* <5*  CREATININE 0.49 0.38* 0.35* 0.32*  CALCIUM 8.3* 7.8* 8.0* 8.3*  MG  --  1.5* 1.7  --   PHOS 2.8 2.6  --   --    Liver Function Tests: Recent Labs  Lab 12/13/19 0039 12/15/19 0814 12/16/19 0225  AST 305* 71* 52*  ALT 87* 49* 42  ALKPHOS 226* 140* 122  BILITOT 1.3* 1.0 0.8  PROT 6.3* 5.7* 5.7*  ALBUMIN 2.3* 2.0* 2.0*   No results for input(s): LIPASE, AMYLASE in the last 168 hours. No results for input(s): AMMONIA in the last 168 hours. CBC: Recent Labs  Lab  12/15/19 0325 12/16/19 0225 12/17/19 0250 12/18/19 0230 12/19/19 0315  WBC 6.6 6.5 5.6 5.4 6.5  HGB 8.8* 8.1* 8.2* 7.9* 7.8*  HCT 30.2* 27.5* 27.8* 26.8* 27.2*  MCV 98.4 97.9 96.9 96.4 95.4  PLT 167 161 160 173 179   Cardiac Enzymes: Recent Labs  Lab 12/15/19 0325  CKTOTAL 19*   BNP (last 3 results) Recent Labs    11/30/19 1809  BNP 583.0*    ProBNP (last 3 results) No results for input(s): PROBNP in the last 8760 hours.  CBG: Recent Labs  Lab 12/18/19 1655 12/18/19 2013 12/19/19 0034 12/19/19 0455 12/19/19 0726  GLUCAP 90 132* 97 87 66*    No results found for this or any previous visit (from the past 240 hour(s)).   Studies: No results found.  Scheduled Meds: . chlorhexidine  15 mL Mouth Rinse BID  . Chlorhexidine Gluconate Cloth  6 each Topical Daily  . clonazePAM  0.5 mg Oral BID  . collagenase   Topical Daily  . diclofenac Sodium  4 g Topical QID  . doxycycline  100 mg Oral Q12H  . feeding supplement  237 mL Oral TID BM  . feeding supplement (OSMOLITE 1.5 CAL)  600 mL Per Tube Q24H  . ketorolac  15 mg Intravenous Q6H  . lidocaine  2 patch Transdermal Q24H  . mouth rinse  15 mL Mouth Rinse q12n4p  . methadone  20 mg Oral Q8H  . pantoprazole  40 mg Oral Daily  . sodium chloride flush  10-40 mL Intracatheter Q12H  . sodium chloride flush  10-40 mL Intracatheter Q12H   Continuous Infusions: . sodium chloride 10 mL/hr at 12/05/19 0617  . sodium chloride Stopped (12/15/19 0324)  . DAPTOmycin (CUBICIN)  IV 500 mg (12/18/19 1934)    Principal Problem:   MRSA infection Active Problems:   Septic shock (HCC)   Opioid use with withdrawal (HCC)   Endocarditis of tricuspid valve   Palliative care encounter   Consultants:  PCCM  Infectious disease  CVTS  Cardiology  Palliative medicine  Procedures:  10/22 echocardiogram  10/24 TEE  11/17 complete echocardiogram  11/22 intraoperative TEE/application of angio [facility]  11/24 core  track  11/29 Limited echocardiogram  Antibiotics: Anti-infectives (From admission, onward)   Start     Dose/Rate Route Frequency Ordered Stop   12/13/19 1115  doxycycline (VIBRA-TABS) tablet 100 mg        100 mg Oral Every 12 hours 12/13/19 1027 01/16/20 2359   12/07/19 2000  DAPTOmycin (CUBICIN) 500 mg in sodium chloride 0.9 % IVPB        500 mg 220 mL/hr over 30 Minutes Intravenous Daily 12/06/19 1345 01/16/20 2359   12/02/19 1400  vancomycin (VANCOREADY) IVPB 500 mg/100 mL  Status:  Discontinued        500  mg 100 mL/hr over 60 Minutes Intravenous Every 8 hours 12/02/19 1127 12/02/19 1131   12/02/19 1400  vancomycin (VANCOREADY) IVPB 750 mg/150 mL  Status:  Discontinued        750 mg 150 mL/hr over 60 Minutes Intravenous Every 8 hours 12/02/19 1131 12/06/19 1339   12/01/19 1200  vancomycin (VANCOREADY) IVPB 500 mg/100 mL  Status:  Discontinued        500 mg 100 mL/hr over 60 Minutes Intravenous Every 24 hours 11/30/19 1454 12/01/19 0946   12/01/19 1200  vancomycin (VANCOCIN) IVPB 1000 mg/200 mL premix  Status:  Discontinued        1,000 mg 200 mL/hr over 60 Minutes Intravenous Every 24 hours 12/01/19 0946 12/02/19 1127   11/30/19 1800  piperacillin-tazobactam (ZOSYN) IVPB 3.375 g  Status:  Discontinued        3.375 g 12.5 mL/hr over 240 Minutes Intravenous Every 8 hours 11/30/19 1454 12/02/19 0234   11/30/19 1115  piperacillin-tazobactam (ZOSYN) IVPB 3.375 g        3.375 g 100 mL/hr over 30 Minutes Intravenous  Once 11/30/19 1110 11/30/19 1306   11/30/19 1115  vancomycin (VANCOCIN) IVPB 1000 mg/200 mL premix        1,000 mg 200 mL/hr over 60 Minutes Intravenous  Once 11/30/19 1110 11/30/19 1337       Time spent: 35 minutes    [name] ANP  [facility] Pager [phone].

## 2019-12-19 NOTE — Progress Notes (Signed)
Called about symptomatic hypoglycemia.  First episode was this AM.  @nd  episode of 53 this afternoon.  Check AM cortisol and PRN dextrose.   JV

## 2019-12-19 NOTE — Progress Notes (Signed)
PT Cancellation Note  Patient Details Name: Amber [name] MRN: [id number] DOB: 27-Sep-1990   Cancelled Treatment:    Reason Eval/Treat Not Completed: (P) Patient declined, no reason specified PT checked on pt 2x 30 min apart and pt reports she was eating lunch. PT will follow back for treatment this afternoon as able.   Cristyn Crossno B. Beverely Risen PT, DPT Acute Rehabilitation Services Pager [phone] Office [phone]    Elon Alas Fleet 12/19/2019, 12:57 PM

## 2019-12-19 NOTE — Plan of Care (Signed)

## 2019-12-20 LAB — GLUCOSE, CAPILLARY
Glucose-Capillary: 117 mg/dL — ABNORMAL HIGH (ref 70–99)
Glucose-Capillary: 120 mg/dL — ABNORMAL HIGH (ref 70–99)
Glucose-Capillary: 212 mg/dL — ABNORMAL HIGH (ref 70–99)
Glucose-Capillary: 80 mg/dL (ref 70–99)
Glucose-Capillary: 88 mg/dL (ref 70–99)
Glucose-Capillary: 93 mg/dL (ref 70–99)

## 2019-12-20 LAB — CBC
HCT: 28.2 % — ABNORMAL LOW (ref 36.0–46.0)
Hemoglobin: 8.2 g/dL — ABNORMAL LOW (ref 12.0–15.0)
MCH: 27.9 pg (ref 26.0–34.0)
MCHC: 29.1 g/dL — ABNORMAL LOW (ref 30.0–36.0)
MCV: 95.9 fL (ref 80.0–100.0)
Platelets: 187 10*3/uL (ref 150–400)
RBC: 2.94 MIL/uL — ABNORMAL LOW (ref 3.87–5.11)
RDW: 21.1 % — ABNORMAL HIGH (ref 11.5–15.5)
WBC: 6.7 10*3/uL (ref 4.0–10.5)
nRBC: 0 % (ref 0.0–0.2)

## 2019-12-20 LAB — CORTISOL: Cortisol, Plasma: 15.2 ug/dL

## 2019-12-20 MED ORDER — CLONAZEPAM 0.25 MG PO TBDP
0.2500 mg | ORAL_TABLET | Freq: Two times a day (BID) | ORAL | Status: AC
  Administered 2019-12-20 – 2019-12-26 (×13): 0.25 mg via ORAL
  Filled 2019-12-20 (×13): qty 1

## 2019-12-20 MED ORDER — SIMETHICONE 80 MG PO CHEW
80.0000 mg | CHEWABLE_TABLET | Freq: Four times a day (QID) | ORAL | Status: DC | PRN
  Administered 2019-12-20: 80 mg via ORAL
  Filled 2019-12-20: qty 1

## 2019-12-20 MED ORDER — CLONAZEPAM 0.5 MG PO TABS: 0.2500 mg | ORAL_TABLET | Freq: Two times a day (BID) | ORAL | Status: DC

## 2019-12-20 MED ORDER — GABAPENTIN 600 MG PO TABS
300.0000 mg | ORAL_TABLET | Freq: Three times a day (TID) | ORAL | Status: DC
  Administered 2019-12-20 – 2020-01-16 (×80): 300 mg via ORAL
  Filled 2019-12-20 (×79): qty 1

## 2019-12-20 NOTE — Plan of Care (Signed)

## 2019-12-20 NOTE — Progress Notes (Signed)
Palliative Medicine RN Note: Discussed pt in team rounds. No palliative needs. Note plan for continued IV abx and d/c 12/29. Our team will sign off. Please re-consult [name] if Ms Calmes asks to speak to our team about changing her GOC.  Margret Chance Meliah Appleman, RN, BSN, [facility] Palliative Medicine Team 12/20/2019 10:37 AM Office [phone]

## 2019-12-20 NOTE — Progress Notes (Signed)
Nutrition Follow-up  DOCUMENTATION CODES:   Not applicable  INTERVENTION:   - Continue Ensure Enlive po TID, each supplement provides 350 kcal and 20 grams of protein  - d/c nocturnal tube feeds per NP and monitor PO intake  NUTRITION DIAGNOSIS:   Inadequate oral intake related to decreased appetite as evidenced by per patient/family report.  Progressing  GOAL:   Patient will meet greater than or equal to 90% of their needs  Progressing  MONITOR:   PO intake, Supplement acceptance, Labs, Weight trends, TF tolerance, Skin, I & O's  REASON FOR ASSESSMENT:   Consult Enteral/tube feeding initiation and management (nocturnal TF)  ASSESSMENT:   29 yo female admitted with septic shock, AKI. PMH includes tricuspid MRSA endocarditis, IVDU.  11/22 - s/p TEE with angiovac for right atrial mass and tricuspid valve vegetation 11/24 - extubated, Cortrak placed (tip gastric), TF initiated  Plan is for d/c on 12/29.  Spoke with pt at bedside. Pt reports that she is eating as best that she can with the Cortrak tube in place. Her throat is uncomfortable and sore and she  She feels like she will eat much better if it is removed.  Tube feeds have been d/c by NP. Plan is to remove Cortrak tomorrow if pt able to eat at least 50% of meals today. Discussed this plan with pt and encouraged PO intake. Pt reports that she is consuming about 1 Ensure supplement every other day. Encouraged pt to increase to one supplement daily.  Admit weight: 51.1 kg Current weight: 47.9 kg  Meal Completion: 25-75%  Medications reviewed and include: Ensure Enlive TID, protonix, IV abx  Labs reviewed: sodium 133 on 12/3, hemoglobin 8.2 CBG's: 53-212 x 24 hours  Diet Order:   Diet Order            Diet regular Room service appropriate? Yes; Fluid consistency: Thin  Diet effective now                 EDUCATION NEEDS:   Education needs have been addressed  Skin:  Skin Assessment: Skin Integrity  Issues: Incisions: right neck, right groin Other: infiltration/phlebitis to left arm  Last BM:  12/19/19  Height:   Ht Readings from Last 1 Encounters:  12/05/19 5\' 2"  (1.575 m)    Weight:   Wt Readings from Last 1 Encounters:  12/20/19 47.9 kg    Ideal Body Weight:  50 kg  BMI:  Body mass index is 19.31 kg/m.  Estimated Nutritional Needs:   Kcal:  1800-2000  Protein:  80-95 gm  Fluid:  >/= 1.5 L    14/07/21, MS, RD, LDN Inpatient Clinical Dietitian Please see AMiON for contact information.

## 2019-12-20 NOTE — Progress Notes (Addendum)
[facility] PROGRESS NOTE  Amber [name] ZDG:[id number] DOB: 03/31/1990 DOA: 11/30/2019 PCP: Patient, No Pcp Per  Status: Remains inpatient appropriate because:Ongoing active pain requiring inpatient pain management, Ongoing diagnostic testing needed not appropriate for outpatient work up, Unsafe d/c plan, IV treatments appropriate due to intensity of illness or inability to take PO and Inpatient level of care appropriate due to severity of illness   Dispo: The patient is from: Home              Anticipated d/c is to: Home              Anticipated d/c date is: > 3 days (01/16/20)              Patient currently is not medically stable to d/c.  Patient's primary reason for remaining in the hospital is to complete 6 weeks of IV antibiotics for her endocarditis with septic emboli well as possible early lumbar osteomyelitis.  She is currently requiring O2 and may be evolving pneumonia with current work-up in progress.  Code Status: Full Family Communication: Patient; mother at bedside on 12/7-discuss plan date of discharge on 12/29.  Rationale for weaning methadone dose and plans to wean and discontinue benzodiazepine DVT prophylaxis: SCDs 2/2 recent thrombocytopenia due to sepsis Vaccination status: Has not been vaccinated against Covid  Foley catheter: No  HPI: 29 year old female patient with known IV drug abuse with known tricuspid MRSA endocarditis who signed out AMA x2 prior to this admission.  She presented with progressive infection.  On 10/21 through 10/23 she was admitted with fever with blood cultures positive for MRSA.  CT of the chest demonstrated septic emboli and edematous L4-5 facet joint (? Evolving osteomyelitis) as well as tricuspid vegetation on CT.  She left AMA.  She returned 2 hours later on 10/23 with severe back pain.  Underwent TEE that demonstrated a 1 cm tricuspid valve mass.  She was evaluated by CT surgery who recommended medical management.  Again she left AMA on  10/25.  Patient was staying with friends but presented back to the hospital due to increasing shortness of breath, weakness, fatigue malaise and nausea.  She arrived in the parking lot obtunded and was found to have multiple needles in her car on 11/17.  In the ER she was hypotensive and started on pressors.  Hemoglobin was low and she was given a blood transfusion.  She was also started on empiric IV vancomycin for known MRSA infection  and was admitted to the critical care service.  She eventually was taken to the OR for angio VAC during this admission.  She has subsequently transitioned out of ICU to stepdown unit.  She has had an IV infiltration and has a superficial wound on her left forearm. ID continues to follow regarding the endocarditis and recommends a total of 6 weeks therapy be daptomycin.  She has continued to have hypoxemia and productive cough and on 11/30 ID added doxycycline.  She is reporting pleuritic chest pain as well.  She is also very malnourished and was started on tube feedings and developed mild electrolyte disturbances from refeeding syndrome which have resolved.  She has low-grade resting tachycardia with no significant abnormalities on echo and cardiology previously did not suspect cardiac etiology to her tachycardia.  Hepatitis C antibody has been positive; she is HIV negative.  She has had persistent elevation in her LFTs.  She is currently on methadone 20 mg 3 times a day but will need to be  on no more than 40 mg per 24 hours at time of discharge to be eligible for outpatient methadone treatment.  Subjective: Patient alert and reporting sore throat because she is breathing through mouth at night and she is also reporting significant discomfort from cortrack tube  Objective: Vitals:   12/20/19 0416 12/20/19 0909  BP:  122/67  Pulse:  98  Resp:  18  Temp: 98.4 F (36.9 C) 98.1 F (36.7 C)  SpO2:  98%    Intake/Output Summary (Last 24 hours) at 12/20/2019 1025 Last  data filed at 12/20/2019 0000 Gross per 24 hour  Intake 191.84 ml  Output --  Net 191.84 ml   Filed Weights   12/17/19 0500 12/19/19 0314 12/20/19 0417  Weight: 47.4 kg 47.5 kg 47.9 kg    Exam:  Constitutional: NAD, calm, mildly uncomfortable 2/2 cortrack tube Respiratory: Coarse to auscultation bilaterally overall improved air movement.  Decreased to 1 L O2.  Normal respiratory effort at rest. No accessory muscle use.  Cardiovascular: Regular rate and rhythm, no murmurs / rubs / gallops. No extremity edema.  Good capillary refill Abdomen: no tenderness, Bowel sounds positive.  Cortrack tube in place for feeding-tolerating solid diet without abdominal pain Musculoskeletal: no clubbing / cyanosis. Good ROM, no contractures. Normal muscle tone.  Skin: Left forearm dressing clean dry and intact. Neurologic: CN 2-12 grossly intact. Sensation intact, DTR normal. Strength 5/5 x all 4 extremities.  Psychiatric: Normal judgment and insight. Alert and oriented x 3. Normal mood.    Assessment/Plan: Acute problems: Septic shock 2/2 MRSA bacteremia and associated TV endocarditis status post angio VAC/abnormal lumbar imaging -12/6 is day 19/42 days of IV daptomycin via PICC inserted 11/30 (last dose should be 01/16/20) -Appreciate assistance of infectious disease team -Follow CK while on daptomycin -Had edema involving lumbar spine on initial imaging- 12/3 added lidocaine patch to help with discomfort improvement in back pain reported-consider follow-up imaging prior to discharge  Acute hypoxemic respiratory failure (multifactorial) 1 acute sepsis 2 septic emboli 3 suspected evolving pneumonia/bilateral pleural effusions -Sepsis physiology has resolved -11/28 patient had abnormal chest x-ray concerning for possible consolidation left lung.  Due to persistent hypoxemia, low-grade fevers and productive cough on 11/30 infectious disease service added doxycycline -12/3 patient with decreased lung  sounds left base and reports of productive cough and pleuritic chest discomfort -12/3 CT chest revealed persistent stable pulmonary nodules and masslike region some which appear cavitated and are consistent with known septic emboli.  Also has enlarging bilateral pleural effusions larger on the left -Continue Toradol IV (ordered scheduled x5 days) transition to prn ibuprofen after completed.  Continue Voltaren gel to anterior chest for pleuritic chest discomfort-reporting improvement and pleuritic chest pain -Continue supportive care with pulmonary toileting and oxygen; no wheezing so no indication for bronchodilators -Encourage use of incentive spirometry  Persistent tachycardia/tricuspid valve insufficiency -Evaluated by CVTS and currently not a surgical candidate for cardiac valve replacement giving active IVDA prior to current admission (although was appropriate for angiovac surgery) -Echocardiogram this admission demonstrated only mild systolic LV dysfunction and cardiology felt this was an appropriate physiologic response to hypoalbuminemia/malnutrition, deconditioning and anemia. -Cardiology also stated that suppression of physiologic tachycardia with beta-blockers not advisable and utilization of diuretics would not be helpful and could possibly cause harm to the patient  Severe protein calorie malnutrition with associated refeeding syndrome -Initial BMI 17 and with initiation of tube feedings patient had abnormalities with potassium, magnesium and phosphorus which has resolved -Admission weight has gone up  to nearly 105 pounds with a BMI now of 19.15 -12/7 we will discontinue tube feedings today and as long as patient tolerates at least 50% of her regular diet plan to remove core track tube on 12/8.  Continue Ensure feeding supplements Nutrition Problem: Inadequate oral intake Etiology: decreased appetite Signs/Symptoms: per patient/family report Interventions: Ensure Enlive (each  supplement provides 350kcal and 20 grams of protein), Tube feeding Estimated body mass index is 19.31 kg/m as calculated from the following:   Height as of this encounter: 5\' 2"  (1.575 m).   Weight as of this encounter: 47.9 kg.  Skin wound secondary to IV infiltration/right groin wound/right neck incision   11/30/19                        12/20/19  -ID also following along regarding this wound.  No indication to change current management and no indication to consult surgery -Toradol added to assist with acute pain and patient on methadone -12/7 added Gabapentin 300 mg TID for pain adjunct Incision (Closed) 12/05/19 Neck Right (Active)  Date First Assessed/Time First Assessed: 12/05/19 1017   Location: Neck  Location Orientation: Right    Assessments 12/05/2019 12:00 PM 12/19/2019  3:14 AM  Dressing Type Gauze (Comment) None  Dressing Clean;Dry;Intact --  Dressing Change Frequency PRN --  Site / Wound Assessment Clean;Dry Clean;Dry  Margins -- Attached edges (approximated)  Closure -- None  Drainage Amount None None     No Linked orders to display     Incision (Closed) 12/05/19 Groin Right (Active)  Date First Assessed/Time First Assessed: 12/05/19 1017   Location: Groin  Location Orientation: Right    Assessments 12/05/2019 12:00 PM 12/19/2019  9:30 AM  Dressing Type Gauze (Comment) None  Dressing Dry;Intact;Clean --  Dressing Change Frequency PRN PRN  Site / Wound Assessment Clean;Dry --  Drainage Amount None --     No Linked orders to display     Wound / Incision (Open or Dehisced) 12/10/19 Non-pressure wound Arm Left;Posterior;Lateral infiltration/phlebitis (Active)  Date First Assessed/Time First Assessed: 12/10/19 1915   Wound Type: Non-pressure wound  Location: Arm  Location Orientation: Left;Posterior;Lateral  Wound Description (Comments): infiltration/phlebitis    Assessments 12/10/2019 11:45 PM 12/19/2019  9:30 AM  Dressing Type Thin film;Non adherent;Gauze (Comment)  --  Dressing Changed New --  Dressing Status Intact Clean;Dry;Intact  Dressing Change Frequency PRN --  Site / Wound Assessment Painful;Red;Bleeding --  Closure None --  Drainage Amount Minimal --  Drainage Description Serosanguineous --  Treatment Other (Comment) --     No Linked orders to display    Chronic IV drug abuse with opiates/recent withdrawal syndrome -Currently on methadone 20 mg every 8 hours (60 mg per 24 hours ) and will need to be on no more than milligrams every 24 hours at time of discharge.  I did discuss this with the patient and she is agreeable to tapering closer to discharge -Currently on twice daily clonazepam-we will titrate down to 0.25 mg BID as of 12/7.  Patient's mother is aware that we will not be prescribing at discharge -[facility] assisting with outpatient methadone clinic referrals; mother also investigating clinics and has been made aware why methadone needs to be no higher than 40 mg/day.  Plan is to give short supply of methadone to transition from date of discharge until clinic appointment available-cording to mother this will likely be within 48 to 72 hours after discharge      Other  problems: Anemia of chronic disease/recent sepsis related thrombocytopenia -Thrombocytopenia has resolved -Hemoglobin stable around 8 -With initiation of NSAIDs daily Protonix has been started  Hyponatremia -Current sodium stable at 133  Hep C antibody positive with persistent transaminitis -Currently has nonobstructive transaminitis that has improved with improvement in nutrition -As of 12/3 albumin 2.0, AST 52, ALT normal at 42 and total bilirubin has been normal -HCVRNA quantitative pending   Data Reviewed: Basic Metabolic Panel: Recent Labs  Lab 12/14/19 0354 12/15/19 0325 12/16/19 0225  NA 132* 133* 133*  K 3.3* 5.1 4.7  CL 95* 98 98  CO2 29 29 30   GLUCOSE 137* 105* 102*  BUN 5* 5* <5*  CREATININE 0.38* 0.35* 0.32*  CALCIUM 7.8* 8.0* 8.3*  MG 1.5*  1.7  --   PHOS 2.6  --   --    Liver Function Tests: Recent Labs  Lab 12/15/19 0814 12/16/19 0225  AST 71* 52*  ALT 49* 42  ALKPHOS 140* 122  BILITOT 1.0 0.8  PROT 5.7* 5.7*  ALBUMIN 2.0* 2.0*   No results for input(s): LIPASE, AMYLASE in the last 168 hours. No results for input(s): AMMONIA in the last 168 hours. CBC: Recent Labs  Lab 12/16/19 0225 12/17/19 0250 12/18/19 0230 12/19/19 0315 12/20/19 0416  WBC 6.5 5.6 5.4 6.5 6.7  HGB 8.1* 8.2* 7.9* 7.8* 8.2*  HCT 27.5* 27.8* 26.8* 27.2* 28.2*  MCV 97.9 96.9 96.4 95.4 95.9  PLT 161 160 173 179 187   Cardiac Enzymes: Recent Labs  Lab 12/15/19 0325  CKTOTAL 19*   BNP (last 3 results) Recent Labs    11/30/19 1809  BNP 583.0*    ProBNP (last 3 results) No results for input(s): PROBNP in the last 8760 hours.  CBG: Recent Labs  Lab 12/19/19 1647 12/19/19 2006 12/19/19 2357 12/20/19 0413 12/20/19 0829  GLUCAP 180* 99 132* 93 120*    No results found for this or any previous visit (from the past 240 hour(s)).   Studies: No results found.  Scheduled Meds: . chlorhexidine  15 mL Mouth Rinse BID  . Chlorhexidine Gluconate Cloth  6 each Topical Daily  . clonazepam  0.25 mg Oral BID  . collagenase   Topical Daily  . diclofenac Sodium  4 g Topical QID  . doxycycline  100 mg Oral Q12H  . feeding supplement  237 mL Oral TID BM  . ketorolac  15 mg Intravenous Q6H  . lidocaine  2 patch Transdermal Q24H  . mouth rinse  15 mL Mouth Rinse q12n4p  . methadone  20 mg Oral Q8H  . pantoprazole  40 mg Oral Daily  . sodium chloride flush  10-40 mL Intracatheter Q12H  . sodium chloride flush  10-40 mL Intracatheter Q12H   Continuous Infusions: . sodium chloride 10 mL/hr at 12/05/19 0617  . sodium chloride Stopped (12/19/19 2156)  . DAPTOmycin (CUBICIN)  IV 500 mg (12/19/19 2028)    Principal Problem:   MRSA infection Active Problems:   Septic shock (HCC)   Opioid use with withdrawal (HCC)   Endocarditis of  tricuspid valve   Palliative care encounter   Consultants:  PCCM  Infectious disease  CVTS  Cardiology  Palliative medicine  Procedures:  10/22 echocardiogram  10/24 TEE  11/17 complete echocardiogram  11/22 intraoperative TEE/application of angio [facility]  11/24 core track  11/29 Limited echocardiogram  Antibiotics: Anti-infectives (From admission, onward)   Start     Dose/Rate Route Frequency Ordered Stop   12/13/19 1115  doxycycline (VIBRA-TABS) tablet 100 mg        100 mg Oral Every 12 hours 12/13/19 1027 01/16/20 2359   12/07/19 2000  DAPTOmycin (CUBICIN) 500 mg in sodium chloride 0.9 % IVPB        500 mg 220 mL/hr over 30 Minutes Intravenous Daily 12/06/19 1345 01/16/20 2359   12/02/19 1400  vancomycin (VANCOREADY) IVPB 500 mg/100 mL  Status:  Discontinued        500 mg 100 mL/hr over 60 Minutes Intravenous Every 8 hours 12/02/19 1127 12/02/19 1131   12/02/19 1400  vancomycin (VANCOREADY) IVPB 750 mg/150 mL  Status:  Discontinued        750 mg 150 mL/hr over 60 Minutes Intravenous Every 8 hours 12/02/19 1131 12/06/19 1339   12/01/19 1200  vancomycin (VANCOREADY) IVPB 500 mg/100 mL  Status:  Discontinued        500 mg 100 mL/hr over 60 Minutes Intravenous Every 24 hours 11/30/19 1454 12/01/19 0946   12/01/19 1200  vancomycin (VANCOCIN) IVPB 1000 mg/200 mL premix  Status:  Discontinued        1,000 mg 200 mL/hr over 60 Minutes Intravenous Every 24 hours 12/01/19 0946 12/02/19 1127   11/30/19 1800  piperacillin-tazobactam (ZOSYN) IVPB 3.375 g  Status:  Discontinued        3.375 g 12.5 mL/hr over 240 Minutes Intravenous Every 8 hours 11/30/19 1454 12/02/19 0234   11/30/19 1115  piperacillin-tazobactam (ZOSYN) IVPB 3.375 g        3.375 g 100 mL/hr over 30 Minutes Intravenous  Once 11/30/19 1110 11/30/19 1306   11/30/19 1115  vancomycin (VANCOCIN) IVPB 1000 mg/200 mL premix        1,000 mg 200 mL/hr over 60 Minutes Intravenous  Once 11/30/19 1110 11/30/19 1337        Time spent: 35 minutes    [name] ANP  [facility] Pager [phone].

## 2019-12-20 NOTE — Evaluation (Signed)
Occupational Therapy Evaluation Patient Details Name: Amber [name] MRN: [id number] DOB: [birth year] Today's Date: 12/20/2019    History of Present Illness 29 yo admitted 11/17 with septic shock due to MRSA endocarditis with IVDU fentanyl and heroin day of admission. PMhx: admission 10/23-10/26 for endocarditis left AMA, IVDU, cellulitis, depression   Clinical Impression   PTA, pt was independent and living with an ex-boyfriend and "going from place to place." Plans on going to her mother's home at dc. Pt currently performing ADLs and functional mobility at Supervision level with RW. Pt presenting with shortness of breath and heavy cough. Despite coughing and SOB, pt SpO2 >90% on RA throughout. HR elevating to 130s with mobility. Pt would benefit from further acute OT to facilitate safe dc. Recommend dc to home once medically stable per physician.   Follow Up Recommendations  No OT follow up    Equipment Recommendations  None recommended by OT    Recommendations for Other Services PT consult     Precautions / Restrictions        Mobility Bed Mobility Overal bed mobility: Needs Assistance Bed Mobility: Supine to Sit;Sit to Supine     Supine to sit: Supervision;HOB elevated Sit to supine: Supervision   General bed mobility comments: Supervision for safety; pt elevating HOB    Transfers Overall transfer level: Needs assistance Equipment used: Rolling walker (2 wheeled) Transfers: Sit to/from Stand Sit to Stand: Supervision         General transfer comment: Supervision for safety    Balance Overall balance assessment: Needs assistance Sitting-balance support: No upper extremity supported;Feet supported Sitting balance-[name]: Good     Standing balance support: No upper extremity supported;During functional activity Standing balance-[name]: Fair                             ADL either performed or assessed with clinical judgement   ADL  Overall ADL's : Needs assistance/impaired Eating/Feeding: Set up;Sitting   Grooming: Supervision/safety;Set up;Oral care;Standing Grooming Details (indicate cue type and reason): Pt performing oral care at sink with supervision. Requesting to use suction tooth brush. Requiring signficant time and brushing several times. SpO2 >90% on RA with good pleth Upper Body Bathing: Set up;Sitting   Lower Body Bathing: Supervison/ safety;Set up;Sit to/from stand   Upper Body Dressing : Set up;Sitting   Lower Body Dressing: Supervision/safety;Set up;Sit to/from stand   Toilet Transfer: Set up;Ambulation;RW;Supervision/safety (simulated in room)           Functional mobility during ADLs: Supervision/safety;Set up;Rolling walker General ADL Comments: Pt performing ADLs at supervision level. Requiring increased time. slight self limiting but agreeable to grooming and mobility     Vision         Perception     Praxis      Pertinent Vitals/Pain Pain Assessment: Faces Faces Pain Scale: Hurts little more Pain Location: ribs cage when coughing Pain Descriptors / Indicators: Aching;Guarding;Stabbing Pain Intervention(s): Monitored during session;Limited activity within patient's tolerance;Repositioned     Hand Dominance Right   Extremity/Trunk Assessment Upper Extremity Assessment Upper Extremity Assessment: Generalized weakness   Lower Extremity Assessment Lower Extremity Assessment: Defer to PT evaluation   Cervical / Trunk Assessment Cervical / Trunk Assessment: Other exceptions Cervical / Trunk Exceptions: guarding with pain   Communication Communication Communication: No difficulties   Cognition Arousal/Alertness: Awake/alert Behavior During Therapy: WFL for tasks assessed/performed;Flat affect Overall Cognitive Status: Impaired/Different from baseline Area of Impairment: Safety/judgement;Problem solving  Safety/Judgement: Decreased  awareness of safety;Decreased awareness of deficits   Problem Solving: Slow processing;Requires verbal cues General Comments: Pt requiring increased cues thorughout. Very anxious about O2. Poor awareness of deficits and performance level.    General Comments  HR elevating to 130s with activity. SpO2 maintaining in 90s on RA with good pleth line; at times dropping into 80-50s but pleth poor    Exercises     Shoulder Instructions      Home Living Family/patient expects to be discharged to:: Private residence Living Arrangements: Parent Available Help at Discharge: Family Type of Home: House Home Access: Stairs to enter Secretary/administrator of Steps: 4   Home Layout: One level     Bathroom Shower/Tub: Tub/shower unit;Walk-in Human resources officer: Standard     Home Equipment: Shower seat - built in          Prior Functioning/Environment Level of Independence: Independent        Comments: Was living with an ex-boyfriend and "just figuring things out" for ADLs and IADLs.         OT Problem List: Decreased range of motion;Decreased activity tolerance;Impaired balance (sitting and/or standing);Decreased knowledge of use of DME or AE;Decreased knowledge of precautions;Decreased strength      OT Treatment/Interventions: Self-care/ADL training;Therapeutic exercise;DME and/or AE instruction;Energy conservation;Therapeutic activities;Patient/family education    OT Goals(Current goals can be found in the care plan section) Acute Rehab OT Goals Patient Stated Goal: Stop coughing OT Goal Formulation: With patient Time For Goal Achievement: 01/03/20 Potential to Achieve Goals: Good  OT Frequency: Min 2X/week   Barriers to D/C:            Co-evaluation              AM-PAC OT "6 Clicks" Daily Activity     Outcome Measure Help from another person eating meals?: A Little Help from another person taking care of personal grooming?: A Little Help from another  person toileting, which includes using toliet, bedpan, or urinal?: A Little Help from another person bathing (including washing, rinsing, drying)?: A Little Help from another person to put on and taking off regular upper body clothing?: A Little Help from another person to put on and taking off regular lower body clothing?: A Little 6 Click Score: 18   End of Session Equipment Utilized During Treatment: Rolling walker Nurse Communication: Mobility status;Other (comment) (performed ADLs without O2)  Activity Tolerance: Patient tolerated treatment well Patient left: in bed;with call bell/phone within reach  OT Visit Diagnosis: Unsteadiness on feet (R26.81);Other abnormalities of gait and mobility (R26.89);Muscle weakness (generalized) (M62.81)                Time: 9528-4132 OT Time Calculation (min): 40 min Charges:  OT General Charges $OT Visit: 1 Visit OT Evaluation $OT Eval Moderate Complexity: 1 Mod OT Treatments $Self Care/Home Management : 23-37 mins  [name] [name] MSOT, OTR/L Acute Rehab Pager: [phone] Office: [phone]  Theodoro Grist Brisha Mccabe 12/20/2019, 3:11 PM

## 2019-12-21 LAB — GLUCOSE, CAPILLARY
Glucose-Capillary: 65 mg/dL — ABNORMAL LOW (ref 70–99)
Glucose-Capillary: 81 mg/dL (ref 70–99)
Glucose-Capillary: 69 mg/dL — ABNORMAL LOW (ref 70–99)
Glucose-Capillary: 103 mg/dL — ABNORMAL HIGH (ref 70–99)
Glucose-Capillary: 90 mg/dL (ref 70–99)
Glucose-Capillary: 60 mg/dL — ABNORMAL LOW (ref 70–99)
Glucose-Capillary: 83 mg/dL (ref 70–99)
Glucose-Capillary: 94 mg/dL (ref 70–99)

## 2019-12-21 MED ORDER — IBUPROFEN 200 MG PO TABS: 400.0000 mg | ORAL_TABLET | Freq: Four times a day (QID) | ORAL | Status: DC | PRN

## 2019-12-21 NOTE — Progress Notes (Signed)
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 PMhx: admission 10/23-10/26 for endocarditis left AMA, IVDU, cellulitis, depression    PT Comments    Pt just waking up on entry but agreeable to walking with therapy. Pt making good progress towards her goals, however continues to be limited by decreased safety awareness, strength and endurance. Pt is supervision for bed mobility and transfers and min guard for ambulation of 150 feet with RW. Pt requires standing rest breaks for coughing fits, encouraged pt to sit up in recliner or at least have bed in chair position to improve drainage in her lungs. With return to room pt brushed teeth and went to bathroom before getting back into bed to eat lunch. Pt able to maintain SaO2 >90% on RA but prefers supplemental O2 for ambulation. Discussed need for weaning back O2 for ambulation. D/c plans remain appropriate. PT will continue to follow acutely.   Follow Up Recommendations  Home health PT;Supervision/Assistance - 24 hour     Equipment Recommendations  Rolling walker with 5" wheels;3in1 (PT)    Recommendations for Other Services OT consult     Precautions / Restrictions Precautions Precautions: Fall Precaution Comments: cortrak Restrictions Weight Bearing Restrictions: No    Mobility  Bed Mobility Overal bed mobility: Needs Assistance Bed Mobility: Supine to Sit;Sit to Supine     Supine to sit: Supervision;HOB elevated Sit to supine: Supervision   General bed mobility comments: Supervision for safety; pt elevating HOB  Transfers Overall transfer level: Needs assistance Equipment used: Rolling walker (2 wheeled) Transfers: Sit to/from Stand Sit to Stand: Supervision         General transfer comment: Supervision  for safety, for power up from bed and toilet, good hand placement for power up   Ambulation/Gait Ambulation/Gait assistance: Min guard Gait Distance (Feet): 150 Feet Assistive device: Rolling walker (2 wheeled) Gait Pattern/deviations: Step-through pattern;Decreased stride length;Shuffle;Trunk flexed Gait velocity: slowed Gait velocity interpretation: <1.8 ft/sec, indicate of risk for recurrent falls General Gait Details: min guard for safety, improved proximity to RW, still limited by need for rest breaks due to coughing          Balance Overall balance assessment: Needs assistance Sitting-balance support: No upper extremity supported;Feet supported Sitting balance-[name]: Good     Standing balance support: No upper extremity supported;During functional activity Standing balance-[name]: Fair Standing balance comment: able to stand at sink and brush teeth without UE support                            Cognition Arousal/Alertness: Awake/alert Behavior During Therapy: WFL for tasks assessed/performed;Flat affect Overall Cognitive Status: Impaired/Different from baseline Area of Impairment: Safety/judgement;Problem solving                         Safety/Judgement: Decreased awareness of safety;Decreased awareness of deficits   Problem Solving: Slow processing;Requires verbal cues General Comments: continues to have decreased knowledge of deficits and slow processing however decreased anxiety about O2 with ambulation          General Comments General comments (skin integrity, edema, etc.): HR 120-132 with ambulation, SaO2 on 2L O2 via Post >90%O2 although poor pleth wave with ambulation rebounded to 90s when back in bed  Pertinent Vitals/Pain Pain Assessment: Faces Faces Pain Scale: Hurts little more Pain Location: ribs cage when coughing, taiklbone Pain Descriptors / Indicators: Aching;Guarding;Stabbing Pain Intervention(s): Limited  activity within patient's tolerance;Monitored during session;Repositioned           PT Goals (current goals can now be found in the care plan section) Acute Rehab PT Goals Patient Stated Goal: Stop coughing PT Goal Formulation: With patient Time For Goal Achievement: 12/23/19 Potential to Achieve Goals: Fair Progress towards PT goals: Progressing toward goals    Frequency    Min 3X/week      PT Plan Current plan remains appropriate    Co-evaluation              AM-PAC PT "6 Clicks" Mobility   Outcome Measure  Help needed turning from your back to your side while in a flat bed without using bedrails?: None Help needed moving from lying on your back to sitting on the side of a flat bed without using bedrails?: A Little Help needed moving to and from a bed to a chair (including a wheelchair)?: A Little Help needed standing up from a chair using your arms (e.g., wheelchair or bedside chair)?: A Little Help needed to walk in hospital room?: A Little Help needed climbing 3-5 steps with a railing? : A Little 6 Click Score: 19    End of Session Equipment Utilized During Treatment: Oxygen;Gait belt (2L [place]) Activity Tolerance: Patient tolerated treatment well Patient left: in bed;with call bell/phone within reach Nurse Communication: Mobility status PT Visit Diagnosis: Other abnormalities of gait and mobility (R26.89);Muscle weakness (generalized) (M62.81);Pain Pain - Right/Left: Right Pain - part of body:  (ribs)     Time: 3953-2023 PT Time Calculation (min) (ACUTE ONLY): 29 min  Charges:  $Gait Training: 8-22 mins $Therapeutic Activity: 8-22 mins                     Kim Oki B. Beverely Risen PT, DPT Acute Rehabilitation Services Pager [phone] Office [phone]    [facility] 12/21/2019, 12:36 PM

## 2019-12-21 NOTE — TOC Progression Note (Signed)
Transition of Care [facility]) - Progression Note    Patient Details  Name: Amber [name] MRN: [id number] Date of Birth: 26-Nov-1990  Transition of [facility]) CM/SW Contact  Huston Foley Jacklynn Ganong, RN Phone Number: 12/21/2019, 1:12 PM  Clinical Narrative:   Patient will remain in hospital for IV antibiotics until 01/11/20. Will need RW/3in1 at DC. PT recommends HH therapy, but patient is uninsured. Hopefully over next few weeiks she will be able to regain strength and mobility and not require HH servies. TOC Team will continue to monitor.     Expected Discharge Plan: Home/Self Care Barriers to Discharge: Continued Medical Work up  Expected Discharge Plan and Services Expected Discharge Plan: Home/Self Care   Discharge Planning Services: CM Consult                                           Social Determinants of Health (SDOH) Interventions    Readmission Risk Interventions No flowsheet data found.

## 2019-12-21 NOTE — Progress Notes (Addendum)
[facility] PROGRESS NOTE  Agustina Carolirica L Tzeng BJY:[id number] DOB: 10/19/1990 DOA: 11/30/2019 PCP: Patient, No Pcp Per  Status: Remains inpatient appropriate because:Ongoing active pain requiring inpatient pain management, Ongoing diagnostic testing needed not appropriate for outpatient work up, Unsafe d/c plan, IV treatments appropriate due to intensity of illness or inability to take PO and Inpatient level of care appropriate due to severity of illness   Dispo: The patient is from: Home              Anticipated d/c is to: Home              Anticipated d/c date is: > 3 days (01/16/20)              Patient currently is not medically stable to d/c.  Patient's primary reason for remaining in the hospital is to complete 6 weeks of IV antibiotics for her endocarditis with septic emboli well as possible early lumbar osteomyelitis.  She is currently requiring O2 2/2 sequelae from septic emboli.  Code Status: Full Family Communication: Patient; mother at bedside on 12/7 DVT prophylaxis: SCDs 2/2 recent thrombocytopenia due to sepsis Vaccination status: Has not been vaccinated against Covid  Foley catheter: No  HPI: 29 year old female patient with known IV drug abuse with known tricuspid MRSA endocarditis who signed out AMA x2 prior to this admission.  She presented with progressive infection.  On 10/21 through 10/23 she was admitted with fever with blood cultures positive for MRSA.  CT of the chest demonstrated septic emboli and edematous L4-5 facet joint (? Evolving osteomyelitis) as well as tricuspid vegetation on CT.  She left AMA.  She returned 2 hours later on 10/23 with severe back pain.  Underwent TEE that demonstrated a 1 cm tricuspid valve mass.  She was evaluated by CT surgery who recommended medical management.  Again she left AMA on 10/25.  Patient was staying with friends but presented back to the hospital due to increasing shortness of breath, weakness, fatigue malaise and nausea.  She  arrived in the parking lot obtunded and was found to have multiple needles in her car on 11/17.  In the ER she was hypotensive and started on pressors.  Hemoglobin was low and she was given a blood transfusion.  She was also started on empiric IV vancomycin for known MRSA infection  and was admitted to the critical care service.  She eventually was taken to the OR for angio VAC during this admission.  She has subsequently transitioned out of ICU to stepdown unit.  She has had an IV infiltration and has a superficial wound on her left forearm. ID continues to follow regarding the endocarditis and recommends a total of 6 weeks therapy be daptomycin.  She has continued to have hypoxemia and productive cough and on 11/30 ID added doxycycline.  She is reporting pleuritic chest pain as well.  She is also very malnourished and was started on tube feedings and developed mild electrolyte disturbances from refeeding syndrome which have resolved.  She has low-grade resting tachycardia with no significant abnormalities on echo and cardiology previously did not suspect cardiac etiology to her tachycardia.  Hepatitis C antibody has been positive; she is HIV negative.  She has had persistent elevation in her LFTs.  She is currently on methadone 20 mg 3 times a day but will need to be on no more than 40 mg per 24 hours at time of discharge to be eligible for outpatient methadone treatment.  Subjective: Patient awake.  PT at bedside.  Patient verbalizes anxiety about ambulating without oxygen.  Objective: Vitals:   12/21/19 0729 12/21/19 1153  BP: 113/86   Pulse: (!) 105   Resp: 20   Temp: 97.8 F (36.6 C) 98.5 F (36.9 C)  SpO2: 100%     Intake/Output Summary (Last 24 hours) at 12/21/2019 1227 Last data filed at 12/21/2019 0835 Gross per 24 hour  Intake 370 ml  Output --  Net 370 ml   Filed Weights   12/19/19 0314 12/20/19 0417 12/21/19 0729  Weight: 47.5 kg 47.9 kg 48.3 kg    Exam:  Constitutional:  NAD, calm, comfortable Respiratory: Coarse to auscultation bilaterally overall improved air movement.  Decreased to 1 L O2.  Normal respiratory effort at rest. No accessory muscle use.  Cardiovascular: Regular rate and rhythm, no murmurs / rubs / gallops. No extremity edema.  Good capillary refill Abdomen: no tenderness, Bowel sounds positive.  Cortrack tube removed earlier this morning -tolerating solid diet without abdominal pain Musculoskeletal: no clubbing / cyanosis. Good ROM, no contractures. Normal muscle tone.  Skin: Left forearm dressing clean dry and intact. Neurologic: CN 2-12 grossly intact. Sensation intact, DTR normal. Strength 5/5 x all 4 extremities.  Psychiatric: Normal judgment and insight. Alert and oriented x 3. Normal mood.    Assessment/Plan: Acute problems: Septic shock 2/2 MRSA bacteremia and associated TV endocarditis status post angio VAC/abnormal lumbar imaging -12/6 is day 19/42 days of IV daptomycin via PICC inserted 11/30 (last dose should be 01/16/20) -Appreciate assistance of infectious disease team -Follow CK while on daptomycin -Had edema involving lumbar spine on initial imaging- 12/3 added lidocaine patch to help with discomfort improvement in back pain reported-consider follow-up imaging prior to discharge  Acute hypoxemic respiratory failure (multifactorial) 1 acute sepsis 2 septic emboli 3 suspected evolving pneumonia/bilateral pleural effusions -Sepsis physiology has resolved -11/28 patient had abnormal chest x-ray concerning for possible consolidation left lung.  Due to persistent hypoxemia, low-grade fevers and productive cough on 11/30 infectious disease service added doxycycline -12/3 CT chest revealed persistent stable pulmonary nodules and masslike region some which appear cavitated consistent with known septic emboli.  Also has enlarging bilateral pleural effusions larger on the left -Slowly weaning O2; down to 1 to 2 L/min -Has completed days  of Toradol IV therefore will transition to ibuprofen as needed -continue Voltaren gel to anterior chest -Continue supportive care with pulmonary toileting and oxygen; no wheezing so no indication for bronchodilators -Encourage use of incentive spirometry  Persistent tachycardia/tricuspid valve insufficiency -Evaluated by CVTS and currently not a surgical candidate for cardiac valve replacement giving active IVDA prior to current admission (although was appropriate for angiovac surgery) -Echocardiogram this admission demonstrated only mild systolic LV dysfunction and cardiology felt this was an appropriate physiologic response to hypoalbuminemia/malnutrition, deconditioning and anemia. -Cardiology also stated that suppression of physiologic tachycardia with beta-blockers not advisable and utilization of diuretics would not be helpful and could possibly cause harm to the patient  Severe protein calorie malnutrition with associated refeeding syndrome -Initial BMI 17 and with initiation of tube feedings patient had abnormalities with potassium, magnesium and phosphorus which has resolved -Admission weight has gone up to nearly 105 pounds with a BMI now of 19.47 -12/7 we will discontinue tube feedings today and as long as patient tolerates at least 50% of her regular diet plan to remove core track tube on 12/8.  Continue Ensure feeding supplements Nutrition Problem: Inadequate oral intake Etiology: decreased appetite Signs/Symptoms: per patient/family report Interventions: Ensure  Enlive (each supplement provides 350kcal and 20 grams of protein), Tube feeding Estimated body mass index is 19.48 kg/m as calculated from the following:   Height as of this encounter: 5\' 2"  (1.575 m).   Weight as of this encounter: 48.3 kg.  Skin wound secondary to IV infiltration/right groin wound/right neck incision   11/30/19                        12/20/19  -ID also following along regarding this wound.  No  indication to change current management and no indication to consult surgery -Completed 5 days of IV Toradol added to assist with acute pain now transition to ibuprofen as needed (patient on methadone candidate for supplemental narcotics for pain) -12/7 added Gabapentin 300 mg TID for pain adjunct with improvement in pain Incision (Closed) 12/05/19 Neck Right (Active)  Date First Assessed/Time First Assessed: 12/05/19 1017   Location: Neck  Location Orientation: Right    Assessments 12/05/2019 12:00 PM 12/19/2019  3:14 AM  Dressing Type Gauze (Comment) None  Dressing Clean;Dry;Intact --  Dressing Change Frequency PRN --  Site / Wound Assessment Clean;Dry Clean;Dry  Margins -- Attached edges (approximated)  Closure -- None  Drainage Amount None None     No Linked orders to display     Incision (Closed) 12/05/19 Groin Right (Active)  Date First Assessed/Time First Assessed: 12/05/19 1017   Location: Groin  Location Orientation: Right    Assessments 12/05/2019 12:00 PM 12/19/2019  9:30 AM  Dressing Type Gauze (Comment) None  Dressing Dry;Intact;Clean --  Dressing Change Frequency PRN PRN  Site / Wound Assessment Clean;Dry --  Drainage Amount None --     No Linked orders to display     Wound / Incision (Open or Dehisced) 12/10/19 Non-pressure wound Arm Left;Posterior;Lateral infiltration/phlebitis (Active)  Date First Assessed/Time First Assessed: 12/10/19 1915   Wound Type: Non-pressure wound  Location: Arm  Location Orientation: Left;Posterior;Lateral  Wound Description (Comments): infiltration/phlebitis    Assessments 12/10/2019 11:45 PM 12/20/2019 11:00 AM  Dressing Type Thin film;Non adherent;Gauze (Comment) Gauze (Comment);Impregnated gauze (petrolatum)  Dressing Changed New Changed  Dressing Status Intact Clean;Dry;Intact  Dressing Change Frequency PRN --  Site / Wound Assessment Painful;Red;Bleeding --  Closure None --  Drainage Amount Minimal None  Drainage Description  Serosanguineous No odor  Treatment Other (Comment) Cleansed     No Linked orders to display    Chronic IV drug abuse with opiates/recent withdrawal syndrome -Currently on methadone 20 mg every 8 hours (60 mg per 24 hours ) and will need to be on no more than milligrams every 24 hours at time of discharge.  I did discuss this with the patient and she is agreeable to tapering closer to discharge -Currently on twice daily clonazepam-we will titrate down to 0.25 mg BID as of 12/7.  Patient's mother is aware that we will not be prescribing at discharge -[facility] assisting with outpatient methadone clinic referrals; mother also investigating clinics and has been made aware why methadone needs to be no higher than 40 mg/day.  Plan is to give short supply of methadone to transition from date of discharge until clinic appointment available-cording to mother this will likely be within 48 to 72 hours after discharge      Other problems: Anemia of chronic disease/recent sepsis related thrombocytopenia -Thrombocytopenia has resolved -Hemoglobin stable around 8 -With initiation of NSAIDs daily Protonix has been started  Hyponatremia -Current sodium stable at 133  Hep C  antibody positive with persistent transaminitis -Currently has nonobstructive transaminitis that has improved with improvement in nutrition -As of 12/3 albumin 2.0, AST 52, ALT normal at 42 and total bilirubin has been normal -HCVRNA quantitative pending   Data Reviewed: Basic Metabolic Panel: Recent Labs  Lab 12/15/19 0325 12/16/19 0225  NA 133* 133*  K 5.1 4.7  CL 98 98  CO2 29 30  GLUCOSE 105* 102*  BUN 5* <5*  CREATININE 0.35* 0.32*  CALCIUM 8.0* 8.3*  MG 1.7  --    Liver Function Tests: Recent Labs  Lab 12/15/19 0814 12/16/19 0225  AST 71* 52*  ALT 49* 42  ALKPHOS 140* 122  BILITOT 1.0 0.8  PROT 5.7* 5.7*  ALBUMIN 2.0* 2.0*   No results for input(s): LIPASE, AMYLASE in the last 168 hours. No results for  input(s): AMMONIA in the last 168 hours. CBC: Recent Labs  Lab 12/16/19 0225 12/17/19 0250 12/18/19 0230 12/19/19 0315 12/20/19 0416  WBC 6.5 5.6 5.4 6.5 6.7  HGB 8.1* 8.2* 7.9* 7.8* 8.2*  HCT 27.5* 27.8* 26.8* 27.2* 28.2*  MCV 97.9 96.9 96.4 95.4 95.9  PLT 161 160 173 179 187   Cardiac Enzymes: Recent Labs  Lab 12/15/19 0325  CKTOTAL 19*   BNP (last 3 results) Recent Labs    11/30/19 1809  BNP 583.0*    ProBNP (last 3 results) No results for input(s): PROBNP in the last 8760 hours.  CBG: Recent Labs  Lab 12/21/19 0335 12/21/19 0423 12/21/19 0743 12/21/19 0833 12/21/19 1056  GLUCAP 65* 81 69* 103* 90    No results found for this or any previous visit (from the past 240 hour(s)).   Studies: No results found.  Scheduled Meds: . chlorhexidine  15 mL Mouth Rinse BID  . Chlorhexidine Gluconate Cloth  6 each Topical Daily  . clonazepam  0.25 mg Oral BID  . collagenase   Topical Daily  . diclofenac Sodium  4 g Topical QID  . doxycycline  100 mg Oral Q12H  . feeding supplement  237 mL Oral TID BM  . gabapentin  300 mg Oral TID  . lidocaine  2 patch Transdermal Q24H  . mouth rinse  15 mL Mouth Rinse q12n4p  . methadone  20 mg Oral Q8H  . pantoprazole  40 mg Oral Daily  . sodium chloride flush  10-40 mL Intracatheter Q12H  . sodium chloride flush  10-40 mL Intracatheter Q12H   Continuous Infusions: . sodium chloride 10 mL/hr at 12/05/19 0617  . sodium chloride Stopped (12/19/19 2156)  . DAPTOmycin (CUBICIN)  IV Stopped (12/21/19 3235)    Principal Problem:   MRSA infection Active Problems:   Septic shock (HCC)   Opioid use with withdrawal (HCC)   Endocarditis of tricuspid valve   Palliative care encounter   Consultants:  PCCM  Infectious disease  CVTS  Cardiology  Palliative medicine  Procedures:  10/22 echocardiogram  10/24 TEE  11/17 complete echocardiogram  11/22 intraoperative TEE/application of angio [facility]  11/24 core  track  11/29 Limited echocardiogram  Antibiotics: Anti-infectives (From admission, onward)   Start     Dose/Rate Route Frequency Ordered Stop   12/13/19 1115  doxycycline (VIBRA-TABS) tablet 100 mg        100 mg Oral Every 12 hours 12/13/19 1027 01/16/20 2359   12/07/19 2000  DAPTOmycin (CUBICIN) 500 mg in sodium chloride 0.9 % IVPB        500 mg 220 mL/hr over 30 Minutes Intravenous Daily 12/06/19 1345  01/16/20 2359   12/02/19 1400  vancomycin (VANCOREADY) IVPB 500 mg/100 mL  Status:  Discontinued        500 mg 100 mL/hr over 60 Minutes Intravenous Every 8 hours 12/02/19 1127 12/02/19 1131   12/02/19 1400  vancomycin (VANCOREADY) IVPB 750 mg/150 mL  Status:  Discontinued        750 mg 150 mL/hr over 60 Minutes Intravenous Every 8 hours 12/02/19 1131 12/06/19 1339   12/01/19 1200  vancomycin (VANCOREADY) IVPB 500 mg/100 mL  Status:  Discontinued        500 mg 100 mL/hr over 60 Minutes Intravenous Every 24 hours 11/30/19 1454 12/01/19 0946   12/01/19 1200  vancomycin (VANCOCIN) IVPB 1000 mg/200 mL premix  Status:  Discontinued        1,000 mg 200 mL/hr over 60 Minutes Intravenous Every 24 hours 12/01/19 0946 12/02/19 1127   11/30/19 1800  piperacillin-tazobactam (ZOSYN) IVPB 3.375 g  Status:  Discontinued        3.375 g 12.5 mL/hr over 240 Minutes Intravenous Every 8 hours 11/30/19 1454 12/02/19 0234   11/30/19 1115  piperacillin-tazobactam (ZOSYN) IVPB 3.375 g        3.375 g 100 mL/hr over 30 Minutes Intravenous  Once 11/30/19 1110 11/30/19 1306   11/30/19 1115  vancomycin (VANCOCIN) IVPB 1000 mg/200 mL premix        1,000 mg 200 mL/hr over 60 Minutes Intravenous  Once 11/30/19 1110 11/30/19 1337       Time spent: 35 minutes    [name] ANP  [facility] Pager [phone].

## 2019-12-21 NOTE — Plan of Care (Signed)

## 2019-12-21 NOTE — Progress Notes (Signed)
Patients CBG 69. Patient asymptomatic. RN set up breakfast. RN will recheck CBG after breakfast and continue to monitor patient.

## 2019-12-22 ENCOUNTER — Inpatient Hospital Stay (HOSPITAL_COMMUNITY): Payer: Self-pay

## 2019-12-22 LAB — GLUCOSE, CAPILLARY
Glucose-Capillary: 123 mg/dL — ABNORMAL HIGH (ref 70–99)
Glucose-Capillary: 62 mg/dL — ABNORMAL LOW (ref 70–99)
Glucose-Capillary: 65 mg/dL — ABNORMAL LOW (ref 70–99)
Glucose-Capillary: 65 mg/dL — ABNORMAL LOW (ref 70–99)
Glucose-Capillary: 67 mg/dL — ABNORMAL LOW (ref 70–99)
Glucose-Capillary: 70 mg/dL (ref 70–99)
Glucose-Capillary: 72 mg/dL (ref 70–99)
Glucose-Capillary: 75 mg/dL (ref 70–99)
Glucose-Capillary: 88 mg/dL (ref 70–99)
Glucose-Capillary: 90 mg/dL (ref 70–99)
Glucose-Capillary: 99 mg/dL (ref 70–99)
Glucose-Capillary: 99 mg/dL (ref 70–99)

## 2019-12-22 LAB — CK: Total CK: 14 U/L — ABNORMAL LOW (ref 38–234)

## 2019-12-22 IMAGING — MR MR LUMBAR SPINE WO/W CM
4 of 7 series · 24 of 48 positions shown · IV contrast (gadavist)
Comparison: Total spine MRI [DATE], and earlier.

CLINICAL DATA: 29-year-old female with sepsis, MRSA bacteremia,
endocarditis.

Continued back pain, but no evidence of spinal infection on
[DATE] total spine MRI.
EXAM:
MRI LUMBAR SPINE WITHOUT AND WITH CONTRAST
TECHNIQUE: Multiplanar and multiecho pulse sequences of the lumbar spine were
obtained without and with intravenous contrast.
CONTRAST:  5mL GADAVIST GADOBUTROL 1 MMOL/ML IV SOLN

[Series 9: T2 · sagittal · 4.0mm · 0.73mm/px · 4 of 16 slices shown (1 of 2)]
[im 1/16]
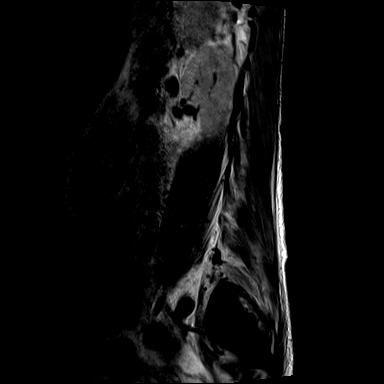
[im 6/16]
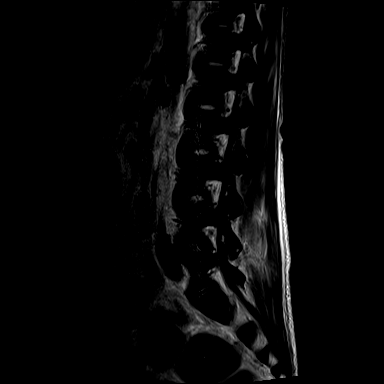
[im 11/16]
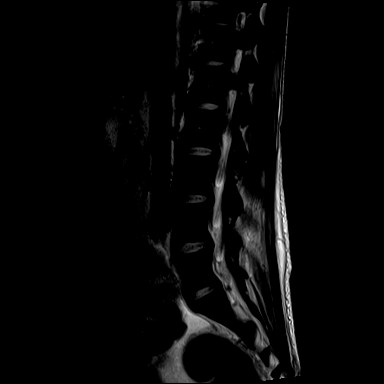
[im 16/16]
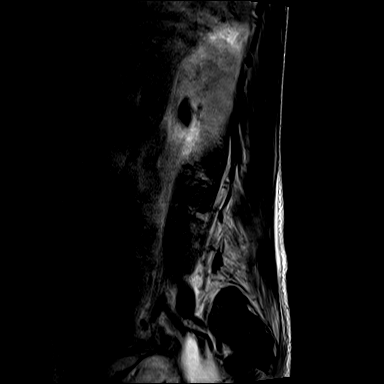

[Series 11: T1 · sagittal · 4.0mm · 0.88mm/px · 5 of 16 slices shown (1 of 2)]
[im 1/16]
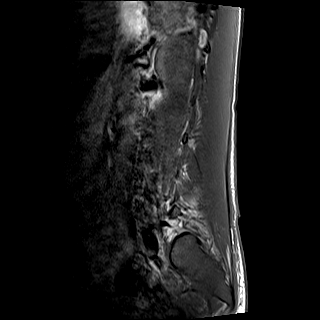
[im 4/16]
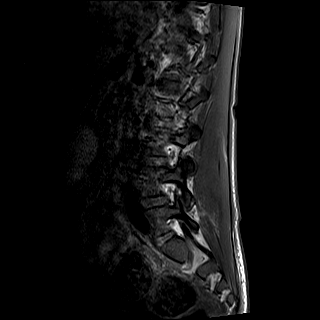
[im 8/16]
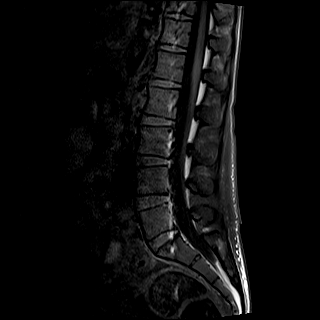
[im 12/16]
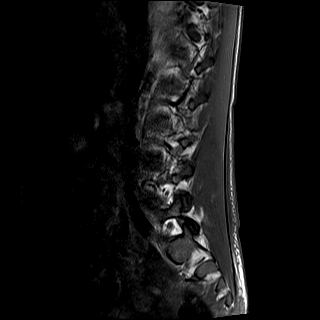
[im 16/16]
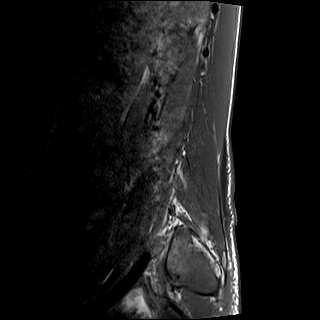

[Series 12: T2 · axial · 4.0mm · 0.57mm/px · z∈[+88,+275]mm · 8 of 33 slices shown (2 of 2)]
[im 1/33]
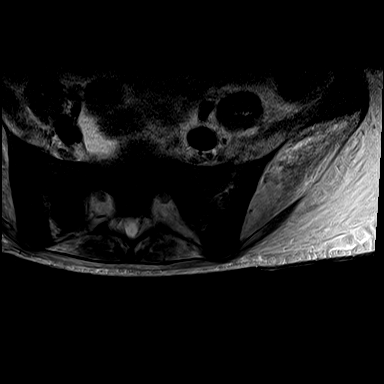
[im 4/33]
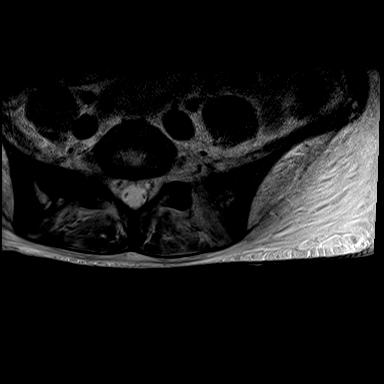
[im 11/33]
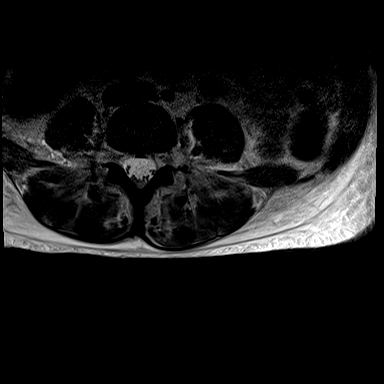
[im 15/33]
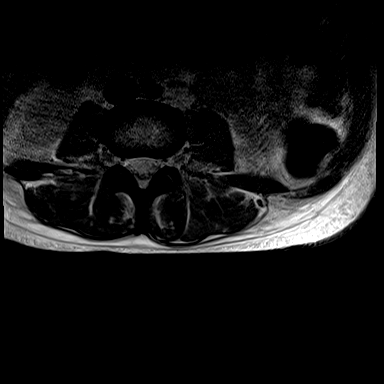
[im 18/33]
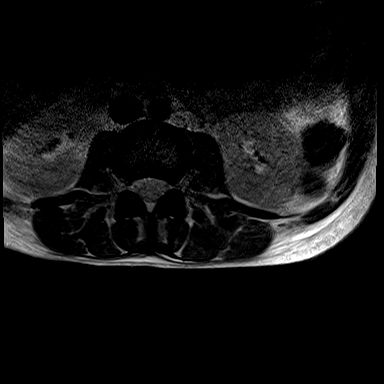
[im 22/33]
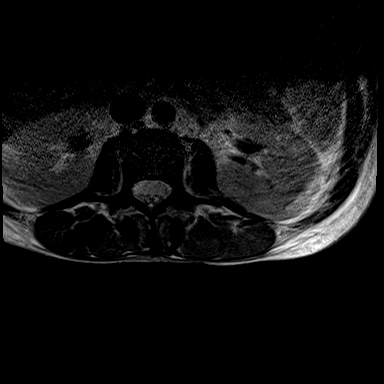
[im 29/33]
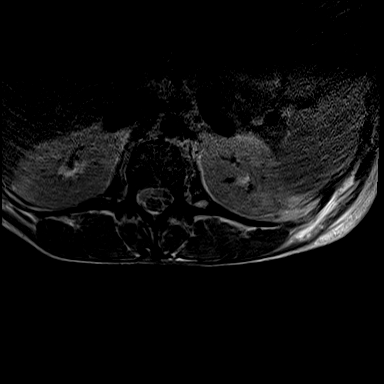
[im 33/33]
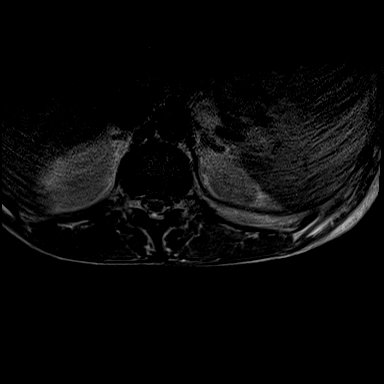

[Series 13: T1 · axial · 4.0mm · 0.34mm/px · z∈[+88,+255]mm · 7 of 33 slices shown (2 of 2)]
[im 1/33]
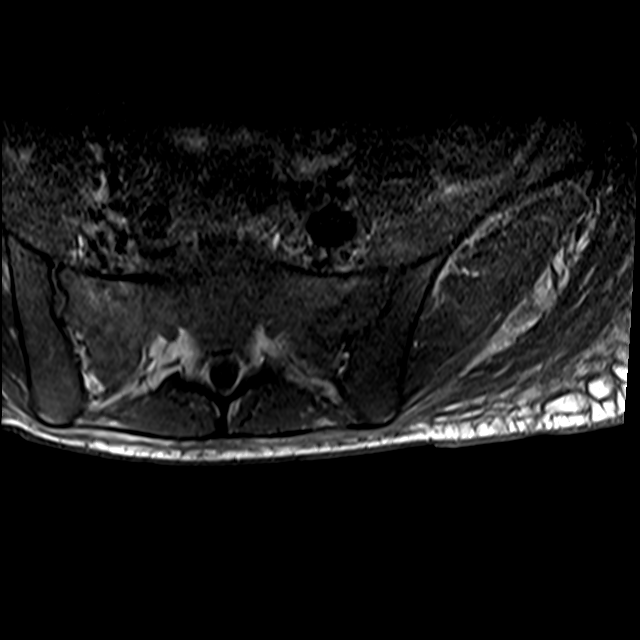
[im 4/33]
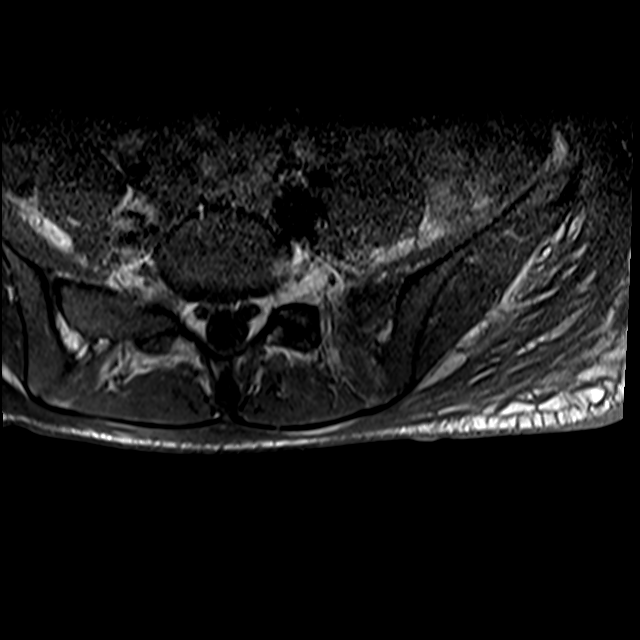
[im 11/33]
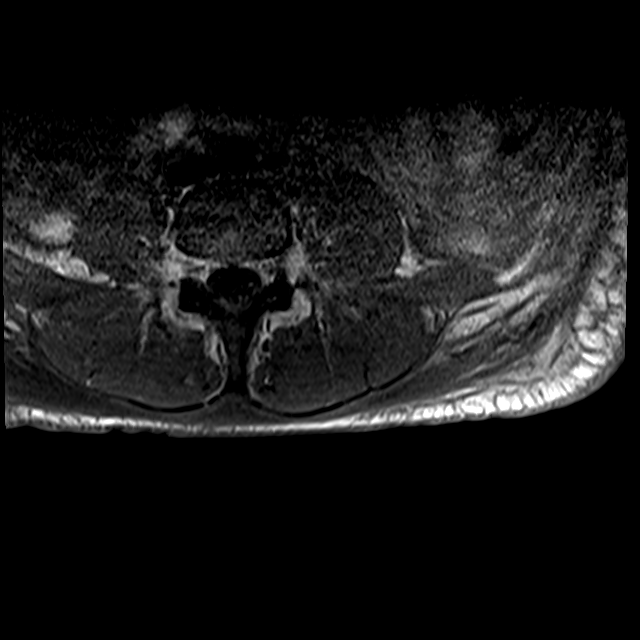
[im 15/33]
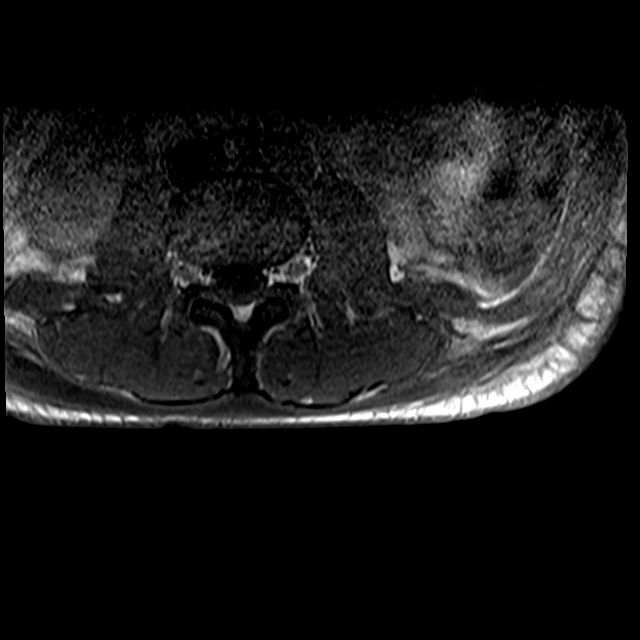
[im 18/33]
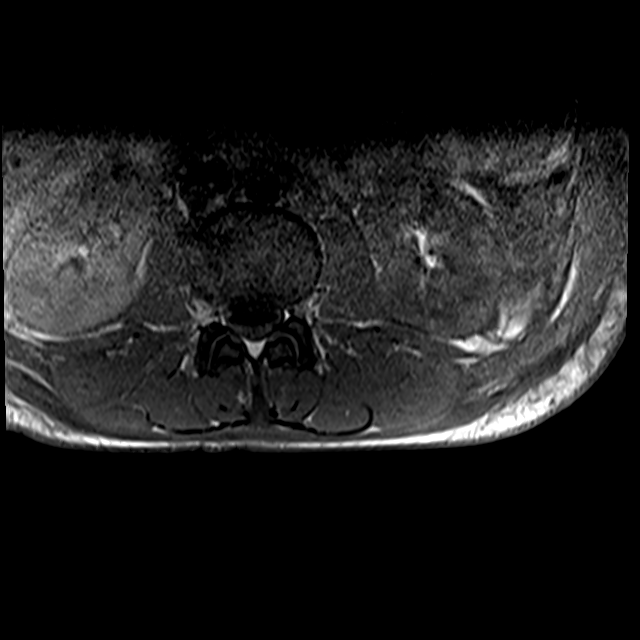
[im 22/33]
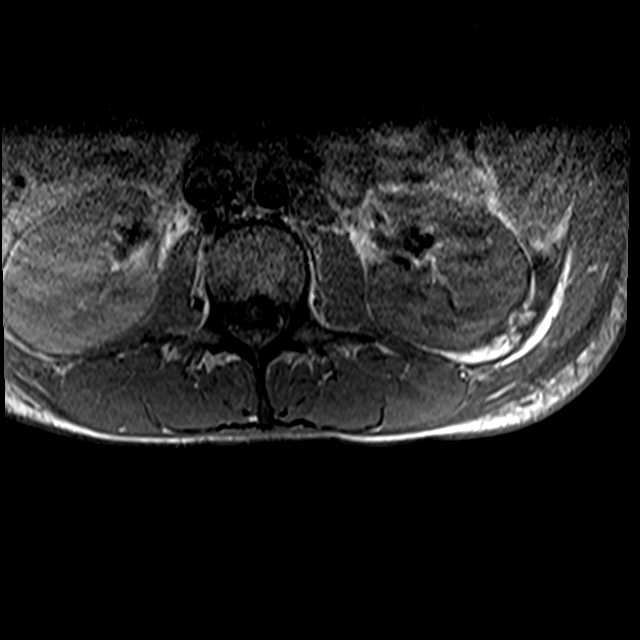
[im 29/33]
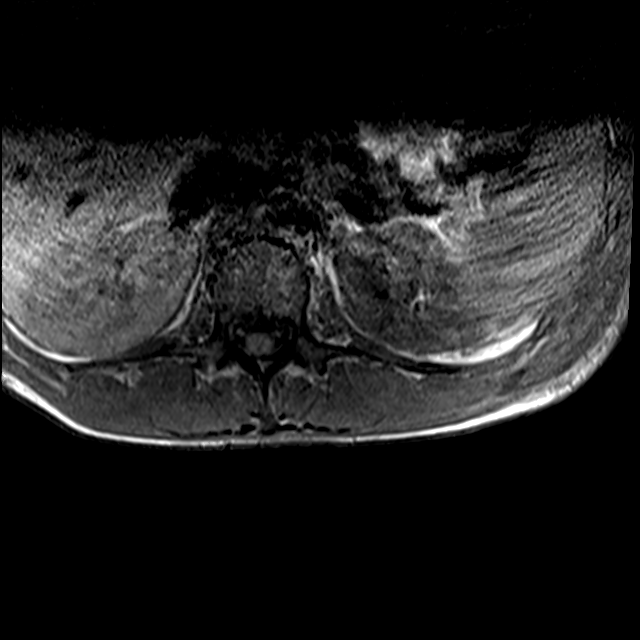

[24 of 48 positions shown; findings below may reference images not displayed]

FINDINGS: Segmentation: Lumbar segmentation appears to be normal and will be
designated as such for this report.

Alignment:  Stable lumbar lordosis. No spondylolisthesis.

Vertebrae: Generalized decreased T1 and T2 marrow signal in the
visible spine and pelvis, probably due to red marrow reactivation.
No marrow edema or evidence of acute osseous abnormality. Visible SI
joints appear normal.

Conus medullaris and cauda equina: Conus extends to the L1 level. No
lower spinal cord or conus signal abnormality. No abnormal
intradural enhancement. No dural thickening. Normal cauda equina
nerve roots.

Paraspinal and other soft tissues: Suggestion of anasarca, body wall
edema. Negative visible abdominal viscera. No focal lumbar
paraspinal soft tissue inflammation or enhancement.

Disc levels:

Intervertebral disc signal and morphology remains normal from
T11-T12 through L5-S1. No significant degenerative changes. No
spinal or foraminal stenosis.
IMPRESSION: 1. Stable, Negative MRI appearance of the Lumbar Spine aside from
probable red marrow reactivation throughout the visible spine and
pelvis.
2. Suggestion of generalized body wall edema, anasarca.

## 2019-12-22 MED ORDER — GADOBUTROL 1 MMOL/ML IV SOLN
5.0000 mL | Freq: Once | INTRAVENOUS | Status: AC | PRN
  Administered 2019-12-22: 5 mL via INTRAVENOUS

## 2019-12-22 MED ORDER — LORAZEPAM 1 MG PO TABS
1.0000 mg | ORAL_TABLET | Freq: Once | ORAL | Status: AC
  Administered 2019-12-22: 1 mg via ORAL
  Filled 2019-12-22: qty 1

## 2019-12-22 NOTE — Progress Notes (Signed)
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 PMhx: admission 10/23-10/26 for endocarditis left AMA, IVDU, cellulitis, depression   OT comments  Pt functioning at a supervision level for toileting, standing grooming and to ambulate in hall with RW and 2L 02. Pt with c/o sacral pain, reports her chest hurts if she tries to lay on her side.   Follow Up Recommendations  No OT follow up    Equipment Recommendations  None recommended by OT    Recommendations for Other Services      Precautions / Restrictions Precautions Precautions: Fall       Mobility Bed Mobility Overal bed mobility: Modified Independent             General bed mobility comments: HOB elevated  Transfers Overall transfer level: Needs assistance Equipment used: Rolling walker (2 wheeled) Transfers: Sit to/from Stand Sit to Stand: Supervision              Balance Overall balance assessment: Needs assistance   Sitting balance-[name]: Good       Standing balance-[name]: Fair                             ADL either performed or assessed with clinical judgement   ADL Overall ADL's : Needs assistance/impaired     Grooming: Wash/dry face;Wash/dry hands;Supervision/safety;Standing           Upper Body Dressing : Set up;Sitting   Lower Body Dressing: Set up;Sitting/lateral leans   Toilet Transfer: Supervision/safety;Ambulation;RW;Regular Toilet   Toileting- Architect and Hygiene: Supervision/safety;Sit to/from stand       Functional mobility during ADLs: Rolling walker       Vision       Perception     Praxis      Cognition Arousal/Alertness: Awake/alert Behavior During Therapy: Flat affect Overall Cognitive Status:  Impaired/Different from baseline Area of Impairment: Safety/judgement;Problem solving                         Safety/Judgement: Decreased awareness of safety;Decreased awareness of deficits   Problem Solving: Slow processing;Requires verbal cues General Comments: tangential        Exercises     Shoulder Instructions       General Comments      Pertinent Vitals/ Pain       Pain Assessment: Faces Faces Pain Scale: Hurts even more Pain Location: sacrum Pain Descriptors / Indicators: Sore Pain Intervention(s): Monitored during session  Home Living                                          Prior Functioning/Environment              Frequency  Min 2X/week        Progress Toward Goals  OT Goals(current goals can now be found in the care plan section)  Progress towards OT goals: Progressing toward goals  Acute Rehab OT Goals Patient Stated Goal: Stop coughing OT Goal Formulation: With patient Time For Goal Achievement: 01/03/20 Potential to Achieve Goals: Good  Plan Discharge plan remains appropriate    Co-evaluation  AM-PAC OT "6 Clicks" Daily Activity     Outcome Measure   Help from another person eating meals?: None Help from another person taking care of personal grooming?: A Little Help from another person toileting, which includes using toliet, bedpan, or urinal?: A Little Help from another person bathing (including washing, rinsing, drying)?: A Little Help from another person to put on and taking off regular upper body clothing?: None Help from another person to put on and taking off regular lower body clothing?: A Little 6 Click Score: 20    End of Session Equipment Utilized During Treatment: Rolling walker;Oxygen (2L)  OT Visit Diagnosis: Unsteadiness on feet (R26.81);Other abnormalities of gait and mobility (R26.89);Muscle weakness (generalized) (M62.81)   Activity Tolerance Patient tolerated  treatment well   Patient Left in bed;with call bell/phone within reach   Nurse Communication Mobility status        Time: 0347-4259 OT Time Calculation (min): 19 min  Charges: OT General Charges $OT Visit: 1 Visit OT Treatments $Self Care/Home Management : 8-22 mins  Martie Round, OTR/L Acute Rehabilitation Services Pager: [phone] Office: [phone]   Evern Bio 12/22/2019, 10:57 AM

## 2019-12-22 NOTE — Progress Notes (Signed)
[facility] PROGRESS NOTE  Amber [name] ZGY:[id number] DOB: 1990/05/18 DOA: 11/30/2019 PCP: Patient, No Pcp Per  Status: Remains inpatient appropriate because:Ongoing active pain requiring inpatient pain management, Ongoing diagnostic testing needed not appropriate for outpatient work up, Unsafe d/c plan, IV treatments appropriate due to intensity of illness or inability to take PO and Inpatient level of care appropriate due to severity of illness   Dispo: The patient is from: Home              Anticipated d/c is to: Home              Anticipated d/c date is: > 3 days (01/16/20)              Patient currently is not medically stable to d/c.  Patient's primary reason for remaining in the hospital is to complete 6 weeks of IV antibiotics for her endocarditis with septic emboli well as possible early lumbar osteomyelitis.  She is currently requiring O2 2/2 sequelae from septic emboli.  Code Status: Full Family Communication: Patient; mother at bedside on 12/7 again updated mother by telephone on 12/9 regarding clarification of anticipated discharge date on 01/16/2020 DVT prophylaxis: SCDs 2/2 recent thrombocytopenia due to sepsis Vaccination status: Has not been vaccinated against Covid  Foley catheter: No  HPI: 29 year old female patient with known IV drug abuse with known tricuspid MRSA endocarditis who signed out AMA x2 prior to this admission.  She presented with progressive infection.  On 10/21 through 10/23 she was admitted with fever with blood cultures positive for MRSA.  CT of the chest demonstrated septic emboli and edematous L4-5 facet joint (? Evolving osteomyelitis) as well as tricuspid vegetation on CT.  She left AMA.  She returned 2 hours later on 10/23 with severe back pain.  Underwent TEE that demonstrated a 1 cm tricuspid valve mass.  She was evaluated by CT surgery who recommended medical management.  Again she left AMA on 10/25.  Patient was staying with friends but  presented back to the hospital due to increasing shortness of breath, weakness, fatigue malaise and nausea.  She arrived in the parking lot obtunded and was found to have multiple needles in her car on 11/17.  In the ER she was hypotensive and started on pressors.  Hemoglobin was low and she was given a blood transfusion.  She was also started on empiric IV vancomycin for known MRSA infection  and was admitted to the critical care service.  She eventually was taken to the OR for angio VAC during this admission.  She has subsequently transitioned out of ICU to stepdown unit.  She has had an IV infiltration and has a superficial wound on her left forearm. ID continues to follow regarding the endocarditis and recommends a total of 6 weeks therapy be daptomycin.  She has continued to have hypoxemia and productive cough and on 11/30 ID added doxycycline.  She is reporting pleuritic chest pain as well.  She is also very malnourished and was started on tube feedings and developed mild electrolyte disturbances from refeeding syndrome which have resolved.  She has low-grade resting tachycardia with no significant abnormalities on echo and cardiology previously did not suspect cardiac etiology to her tachycardia.  Hepatitis C antibody has been positive; she is HIV negative.  She has had persistent elevation in her LFTs.  She is currently on methadone 20 mg 3 times a day but will need to be on no more than 40 mg per 24 hours  at time of discharge to be eligible for outpatient methadone treatment.  Subjective: Patient awakened.  Primarily complaining of low back pain with ambulation.  Denied shortness of breath states wound care more tolerable now that Vaseline dressing added  Objective: Vitals:   12/22/19 0338 12/22/19 0800  BP: 109/70 127/89  Pulse: 98 (!) 106  Resp: 19 20  Temp: 98.3 F (36.8 C) 98.1 F (36.7 C)  SpO2: 100% 100%    Intake/Output Summary (Last 24 hours) at 12/22/2019 1226 Last data filed at  12/22/2019 0500 Gross per 24 hour  Intake 10 ml  Output 950 ml  Net -940 ml   Filed Weights   12/20/19 0417 12/21/19 0729 12/22/19 0500  Weight: 47.9 kg 48.3 kg 47.9 kg    Exam:  Constitutional: NAD, calm, comfortable Respiratory: Coarse to auscultation bilaterally overall improved air movement.  2 L O2.  Normal respiratory effort at rest. No accessory muscle use.  Cardiovascular: Regular rate and rhythm, no murmurs / rubs / gallops. No extremity edema.  Good capillary refill Abdomen: no tenderness, Bowel sounds positive.  Cortrack tube removed earlier this morning -tolerating solid diet without abdominal pain Musculoskeletal: no clubbing / cyanosis. Good ROM, no contractures. Normal muscle tone.  Skin: Left forearm dressing clean dry and intact. Neurologic: CN 2-12 grossly intact. Sensation intact, DTR normal. Strength 5/5 x all 4 extremities.  Psychiatric: Normal judgment and insight. Alert and oriented x 3. Normal mood.    Assessment/Plan: Acute problems: Septic shock 2/2 MRSA bacteremia and associated TV endocarditis status post angio VAC/abnormal lumbar imaging -12/6 is day 19/42 days of IV daptomycin via PICC inserted 11/30 (last dose should be 01/16/20) -Appreciate assistance of infectious disease team -CK is been stable while on daptomycin -Had edema involving lumbar spine on initial imaging- 12/3 added lidocaine patch to help with discomfort improvement in back pain reported-consider follow-up imaging prior to discharge -Repeat imaging on 11/23 revealed no evidence of infection in lumbar spine.  Having increased lumbar pain likely related to musculoskeletal etiology we will go ahead and repeat MRI 1 more time on 12/9-will be given Ativan for antianxiety medication during imaging  Acute hypoxemic respiratory failure (multifactorial) 1 acute sepsis 2 septic emboli 3 suspected evolving pneumonia/bilateral pleural effusions -Sepsis physiology has resolved -11/28 patient had  abnormal chest x-ray concerning for possible consolidation left lung.  Due to persistent hypoxemia, low-grade fevers and productive cough on 11/30 infectious disease service added doxycycline -12/3 CT chest revealed persistent stable pulmonary nodules and masslike region some which appear cavitated consistent with known septic emboli.  Also has enlarging bilateral pleural effusions larger on the left -Slowly weaning O2; down to 2 L/min -Has completed days of Toradol IV therefore will transition to ibuprofen as needed -continue Voltaren gel to anterior chest -Continue supportive care with pulmonary toileting and oxygen; no wheezing so no indication for bronchodilators -Encourage use of incentive spirometry  Persistent tachycardia/tricuspid valve insufficiency -Evaluated by CVTS and currently not a surgical candidate for cardiac valve replacement giving active IVDA prior to current admission (although was appropriate for angiovac surgery) -Echocardiogram this admission demonstrated only mild systolic LV dysfunction and cardiology felt this was an appropriate physiologic response to hypoalbuminemia/malnutrition, deconditioning and anemia. -Cardiology also stated that suppression of physiologic tachycardia with beta-blockers not advisable and utilization of diuretics would not be helpful and could possibly cause harm to the patient  Severe protein calorie malnutrition with associated refeeding syndrome -Initial BMI 17 and with initiation of tube feedings patient had abnormalities with  potassium, magnesium and phosphorus which has resolved -Admission weight has gone up to nearly 105 pounds with a BMI now of 19.47 -12/7 we will discontinue tube feedings today and as long as patient tolerates at least 50% of her regular diet plan to remove core track tube on 12/8.  Continue Ensure feeding supplements Nutrition Problem: Inadequate oral intake Etiology: decreased appetite Signs/Symptoms: per patient/family  report Interventions: Ensure Enlive (each supplement provides 350kcal and 20 grams of protein),Tube feeding Estimated body mass index is 19.31 kg/m as calculated from the following:   Height as of this encounter: 5\' 2"  (1.575 m).   Weight as of this encounter: 47.9 kg.  Skin wound secondary to IV infiltration/right groin wound/right neck incision   11/30/19                        12/20/19  -ID also following along regarding this wound.  No indication to change current management and no indication to consult surgery -Completed 5 days of IV Toradol added to assist with acute pain now transition to ibuprofen as needed (patient on methadone candidate for supplemental narcotics for pain) -12/7 added Gabapentin 300 mg TID for pain adjunct with improvement in pain Incision (Closed) 12/05/19 Neck Right (Active)  Date First Assessed/Time First Assessed: 12/05/19 1017   Location: Neck  Location Orientation: Right    Assessments 12/05/2019 12:00 PM 12/19/2019  3:14 AM  Dressing Type Gauze (Comment) None  Dressing Clean;Dry;Intact --  Dressing Change Frequency PRN --  Site / Wound Assessment Clean;Dry Clean;Dry  Margins -- Attached edges (approximated)  Closure -- None  Drainage Amount None None     No Linked orders to display     Incision (Closed) 12/05/19 Groin Right (Active)  Date First Assessed/Time First Assessed: 12/05/19 1017   Location: Groin  Location Orientation: Right    Assessments 12/05/2019 12:00 PM 12/19/2019  9:30 AM  Dressing Type Gauze (Comment) None  Dressing Dry;Intact;Clean --  Dressing Change Frequency PRN PRN  Site / Wound Assessment Clean;Dry --  Drainage Amount None --     No Linked orders to display     Wound / Incision (Open or Dehisced) 12/10/19 Non-pressure wound Arm Left;Posterior;Lateral infiltration/phlebitis (Active)  Date First Assessed/Time First Assessed: 12/10/19 1915   Wound Type: Non-pressure wound  Location: Arm  Location Orientation:  Left;Posterior;Lateral  Wound Description (Comments): infiltration/phlebitis    Assessments 12/10/2019 11:45 PM 12/21/2019  8:40 PM  Dressing Type Thin film;Non adherent;Gauze (Comment) Gauze (Comment)  Dressing Changed New --  Dressing Status Intact Clean;Dry;Intact  Dressing Change Frequency PRN Daily  Site / Wound Assessment Painful;Red;Bleeding Dressing in place / Unable to assess  Closure None --  Drainage Amount Minimal --  Drainage Description Serosanguineous --  Treatment Other (Comment) --     No Linked orders to display    Chronic IV drug abuse with opiates/recent withdrawal syndrome -Currently on methadone 20 mg every 8 hours (60 mg per 24 hours ) and will need to be on no more than milligrams every 24 hours at time of discharge.  I did discuss this with the patient and she is agreeable to tapering closer to discharge -Currently on twice daily clonazepam-we will titrate down to 0.25 mg BID as of 12/7.  Patient's mother is aware that we will not be prescribing at discharge -[facility]; mother also investigating clinics and has been made aware why methadone needs to be no higher than 40 mg/day.  Plan is to give short supply of methadone to transition from date of discharge until clinic appointment available-cording to mother this will likely be within 48 to 72 hours after discharge      Other problems: Anemia of chronic disease/recent sepsis related thrombocytopenia -Thrombocytopenia has resolved -Hemoglobin stable around 8 -With initiation of NSAIDs daily Protonix has been started  Hyponatremia -Current sodium stable at 133  Hep C antibody positive with persistent transaminitis -Currently has nonobstructive transaminitis that has improved with improvement in nutrition -As of 12/3 albumin 2.0, AST 52, ALT normal at 42 and total bilirubin has been normal -HCVRNA quantitative pending   Data Reviewed: Basic Metabolic  Panel: Recent Labs  Lab 12/16/19 0225  NA 133*  K 4.7  CL 98  CO2 30  GLUCOSE 102*  BUN <5*  CREATININE 0.32*  CALCIUM 8.3*   Liver Function Tests: Recent Labs  Lab 12/16/19 0225  AST 52*  ALT 42  ALKPHOS 122  BILITOT 0.8  PROT 5.7*  ALBUMIN 2.0*   No results for input(s): LIPASE, AMYLASE in the last 168 hours. No results for input(s): AMMONIA in the last 168 hours. CBC: Recent Labs  Lab 12/16/19 0225 12/17/19 0250 12/18/19 0230 12/19/19 0315 12/20/19 0416  WBC 6.5 5.6 5.4 6.5 6.7  HGB 8.1* 8.2* 7.9* 7.8* 8.2*  HCT 27.5* 27.8* 26.8* 27.2* 28.2*  MCV 97.9 96.9 96.4 95.4 95.9  PLT 161 160 173 179 187   Cardiac Enzymes: Recent Labs  Lab 12/22/19 0535  CKTOTAL 14*   BNP (last 3 results) Recent Labs    11/30/19 1809  BNP 583.0*    ProBNP (last 3 results) No results for input(s): PROBNP in the last 8760 hours.  CBG: Recent Labs  Lab 12/22/19 0450 12/22/19 0526 12/22/19 0530 12/22/19 0727 12/22/19 1159  GLUCAP 65* 72 99 123* 62*    No results found for this or any previous visit (from the past 240 hour(s)).   Studies: No results found.  Scheduled Meds: . chlorhexidine  15 mL Mouth Rinse BID  . Chlorhexidine Gluconate Cloth  6 each Topical Daily  . clonazepam  0.25 mg Oral BID  . collagenase   Topical Daily  . diclofenac Sodium  4 g Topical QID  . doxycycline  100 mg Oral Q12H  . feeding supplement  237 mL Oral TID BM  . gabapentin  300 mg Oral TID  . lidocaine  2 patch Transdermal Q24H  . LORazepam  1 mg Oral Once  . mouth rinse  15 mL Mouth Rinse q12n4p  . methadone  20 mg Oral Q8H  . pantoprazole  40 mg Oral Daily  . sodium chloride flush  10-40 mL Intracatheter Q12H  . sodium chloride flush  10-40 mL Intracatheter Q12H   Continuous Infusions: . sodium chloride 10 mL/hr at 12/05/19 0617  . sodium chloride Stopped (12/19/19 2156)  . DAPTOmycin (CUBICIN)  IV Stopped (12/22/19 0703)    Principal Problem:   MRSA infection Active  Problems:   Septic shock (HCC)   Opioid use with withdrawal (HCC)   Endocarditis of tricuspid valve   Palliative care encounter   Consultants:  PCCM  Infectious disease  CVTS  Cardiology  Palliative medicine  Procedures:  10/22 echocardiogram  10/24 TEE  11/17 complete echocardiogram  11/22 intraoperative TEE/application of angio [facility]  11/24 core track  11/29 Limited echocardiogram  Antibiotics: Anti-infectives (From admission, onward)   Start     Dose/Rate Route Frequency Ordered Stop   12/13/19 1115  doxycycline (VIBRA-TABS) tablet 100 mg        100 mg Oral Every 12 hours 12/13/19 1027 01/16/20 2359   12/07/19 2000  DAPTOmycin (CUBICIN) 500 mg in sodium chloride 0.9 % IVPB        500 mg 220 mL/hr over 30 Minutes Intravenous Daily 12/06/19 1345 01/16/20 2359   12/02/19 1400  vancomycin (VANCOREADY) IVPB 500 mg/100 mL  Status:  Discontinued        500 mg 100 mL/hr over 60 Minutes Intravenous Every 8 hours 12/02/19 1127 12/02/19 1131   12/02/19 1400  vancomycin (VANCOREADY) IVPB 750 mg/150 mL  Status:  Discontinued        750 mg 150 mL/hr over 60 Minutes Intravenous Every 8 hours 12/02/19 1131 12/06/19 1339   12/01/19 1200  vancomycin (VANCOREADY) IVPB 500 mg/100 mL  Status:  Discontinued        500 mg 100 mL/hr over 60 Minutes Intravenous Every 24 hours 11/30/19 1454 12/01/19 0946   12/01/19 1200  vancomycin (VANCOCIN) IVPB 1000 mg/200 mL premix  Status:  Discontinued        1,000 mg 200 mL/hr over 60 Minutes Intravenous Every 24 hours 12/01/19 0946 12/02/19 1127   11/30/19 1800  piperacillin-tazobactam (ZOSYN) IVPB 3.375 g  Status:  Discontinued        3.375 g 12.5 mL/hr over 240 Minutes Intravenous Every 8 hours 11/30/19 1454 12/02/19 0234   11/30/19 1115  piperacillin-tazobactam (ZOSYN) IVPB 3.375 g        3.375 g 100 mL/hr over 30 Minutes Intravenous  Once 11/30/19 1110 11/30/19 1306   11/30/19 1115  vancomycin (VANCOCIN) IVPB 1000 mg/200 mL premix         1,000 mg 200 mL/hr over 60 Minutes Intravenous  Once 11/30/19 1110 11/30/19 1337       Time spent: 35 minutes    [name] ANP  [facility] Pager [phone].

## 2019-12-23 LAB — GLUCOSE, CAPILLARY
Glucose-Capillary: 67 mg/dL — ABNORMAL LOW (ref 70–99)
Glucose-Capillary: 80 mg/dL (ref 70–99)
Glucose-Capillary: 64 mg/dL — ABNORMAL LOW (ref 70–99)
Glucose-Capillary: 132 mg/dL — ABNORMAL HIGH (ref 70–99)
Glucose-Capillary: 64 mg/dL — ABNORMAL LOW (ref 70–99)
Glucose-Capillary: 60 mg/dL — ABNORMAL LOW (ref 70–99)
Glucose-Capillary: 94 mg/dL (ref 70–99)
Glucose-Capillary: 129 mg/dL — ABNORMAL HIGH (ref 70–99)

## 2019-12-23 MED ORDER — ESCITALOPRAM OXALATE 10 MG PO TABS
10.0000 mg | ORAL_TABLET | Freq: Every day | ORAL | Status: DC
  Administered 2019-12-23 – 2020-01-05 (×14): 10 mg via ORAL
  Filled 2019-12-23 (×14): qty 1

## 2019-12-23 NOTE — Progress Notes (Signed)
With on and off hacking cough,  Hycodan given. Continue to monitor.

## 2019-12-23 NOTE — Progress Notes (Signed)
CBG-64 asypmtomatic encouraged to eat breakfast, tolerated 70 %. MD aware. Repeat cbg 132mg /dl.

## 2019-12-23 NOTE — Progress Notes (Addendum)
[facility] PROGRESS NOTE  SRAH AKE ZOX:[id number] DOB: 1990-06-02 DOA: 11/30/2019 PCP: Patient, No Pcp Per  Status: Remains inpatient appropriate because:Ongoing active pain requiring inpatient pain management, Ongoing diagnostic testing needed not appropriate for outpatient work up, Unsafe d/c plan, IV treatments appropriate due to intensity of illness or inability to take PO and Inpatient level of care appropriate due to severity of illness   Dispo: The patient is from: Home              Anticipated d/c is to: Home              Anticipated d/c date is: > 3 days (01/16/20) **has requested to stay through 01/17/2020 due to personal/social issues-we will discuss with her mom and try to honor this request              Patient currently is not medically stable to d/c.  Patient's primary reason for remaining in the hospital is to complete 6 weeks of IV antibiotics for her endocarditis with septic emboli well as possible early lumbar osteomyelitis.  She is currently requiring O2 2/2 sequelae from septic emboli.  Code Status: Full Family Communication: Patient; mother at bedside on 12/7 again updated mother by telephone on 12/9 regarding clarification of anticipated discharge date on 01/16/2020 DVT prophylaxis: SCDs 2/2 recent thrombocytopenia due to sepsis Vaccination status: Has not been vaccinated against Covid  Foley catheter: No  HPI: 29 year old female patient with known IV drug abuse with known tricuspid MRSA endocarditis who signed out AMA x2 prior to this admission.  She presented with progressive infection.  On 10/21 through 10/23 she was admitted with fever with blood cultures positive for MRSA.  CT of the chest demonstrated septic emboli and edematous L4-5 facet joint (? Evolving osteomyelitis) as well as tricuspid vegetation on CT.  She left AMA.  She returned 2 hours later on 10/23 with severe back pain.  Underwent TEE that demonstrated a 1 cm tricuspid valve mass.  She was  evaluated by CT surgery who recommended medical management.  Again she left AMA on 10/25.  Patient was staying with friends but presented back to the hospital due to increasing shortness of breath, weakness, fatigue malaise and nausea.  She arrived in the parking lot obtunded and was found to have multiple needles in her car on 11/17.  In the ER she was hypotensive and started on pressors.  Hemoglobin was low and she was given a blood transfusion.  She was also started on empiric IV vancomycin for known MRSA infection  and was admitted to the critical care service.  She eventually was taken to the OR for angio VAC during this admission.  She has subsequently transitioned out of ICU to stepdown unit.  She has had an IV infiltration and has a superficial wound on her left forearm. ID continues to follow regarding the endocarditis and recommends a total of 6 weeks therapy be daptomycin.  She has continued to have hypoxemia and productive cough and on 11/30 ID added doxycycline.  She is reporting pleuritic chest pain as well.  She is also very malnourished and was started on tube feedings and developed mild electrolyte disturbances from refeeding syndrome which have resolved.  She has low-grade resting tachycardia with no significant abnormalities on echo and cardiology previously did not suspect cardiac etiology to her tachycardia.  Hepatitis C antibody has been positive; she is HIV negative.  She has had persistent elevation in her LFTs.  She is currently on  methadone 20 mg 3 times a day but will need to be on no more than 40 mg per 24 hours at time of discharge to be eligible for outpatient methadone treatment.  Subjective: Patient awake.  Just completed wound care to left upper extremity.  Exclaimed "that nasty scab finally came off".  Continues to have some mild low back pain especially with walking  Objective: Vitals:   12/23/19 0610 12/23/19 0732  BP: 101/69 135/81  Pulse: (!) 112 (!) 116  Resp: 18  (!) 22  Temp:  98.3 F (36.8 C)  SpO2: 98% 100%    Intake/Output Summary (Last 24 hours) at 12/23/2019 1042 Last data filed at 12/23/2019 0917 Gross per 24 hour  Intake 590 ml  Output --  Net 590 ml   Filed Weights   12/21/19 0729 12/22/19 0500 12/23/19 0510  Weight: 48.3 kg 47.9 kg 46.5 kg    Exam:  Constitutional: NAD, calm, comfortable Respiratory: Coarse to auscultation bilaterally overall improved air movement.  2 L O2.  Normal respiratory effort at rest. No accessory muscle use.  Cardiovascular: Regular rate and rhythm, no murmurs / rubs / gallops. No extremity edema.  Good capillary refill Abdomen: no tenderness, Bowel sounds positive.  Cortrack tube removed earlier this morning -tolerating solid diet without abdominal pain Musculoskeletal: no clubbing / cyanosis. Good ROM, no contractures. Normal muscle tone.  Tender low back Skin: Left forearm dressing clean dry and intact. Neurologic: CN 2-12 grossly intact. Sensation intact, DTR normal. Strength 5/5 x all 4 extremities.  Psychiatric: Normal judgment and insight. Alert and oriented x 3. Normal mood.    Assessment/Plan: Acute problems: Septic shock 2/2 MRSA bacteremia and associated TV endocarditis status post angio VAC/abnormal lumbar imaging -12/6 is day 19/42 days of IV daptomycin via PICC inserted 11/30 (last dose should be 01/16/20) -Appreciate assistance of infectious disease team -CK is been stable while on daptomycin -Had edema involving lumbar spine on initial imaging- 12/3 added lidocaine patch to help with discomfort improvement in back pain reported-consider follow-up imaging prior to discharge -Repeat imaging on 11/23 revealed no evidence of infection in lumbar spine.  With follow-up imaging on 12/9 unremarkable except for soft tissue edema.  Acute hypoxemic respiratory failure (multifactorial) 1 acute sepsis 2 septic emboli 3 suspected evolving pneumonia/bilateral pleural effusions -Sepsis physiology  has resolved -11/28 patient had abnormal chest x-ray concerning for possible consolidation left lung.  Due to persistent hypoxemia, low-grade fevers and productive cough on 11/30 infectious disease service added doxycycline -12/3 CT chest revealed persistent stable pulmonary nodules and masslike region some which appear cavitated consistent with known septic emboli.  Also has enlarging bilateral pleural effusions larger on the left -Slowly weaning O2; down to 2 L/min -Has completed days of Toradol IV therefore will transition to ibuprofen as needed -continue Voltaren gel to anterior chest -Continue supportive care with pulmonary toileting and oxygen; no wheezing so no indication for bronchodilators -Encourage use of incentive spirometry  Persistent tachycardia/tricuspid valve insufficiency -Evaluated by CVTS and currently not a surgical candidate for cardiac valve replacement giving active IVDA prior to current admission (although was appropriate for angiovac surgery) -Echocardiogram this admission demonstrated only mild systolic LV dysfunction and cardiology felt this was an appropriate physiologic response to hypoalbuminemia/malnutrition, deconditioning and anemia. -Cardiology also stated that suppression of physiologic tachycardia with beta-blockers not advisable and utilization of diuretics would not be helpful and could possibly cause harm to the patient  LBP -Musculoskeletal in etiology noting no significant abnormalities on recent MRI -Continue  ibuprofen prn -Add K pad  Severe protein calorie malnutrition with associated refeeding syndrome -Initial BMI 17 and with initiation of tube feedings patient had abnormalities with potassium, magnesium and phosphorus which has resolved -Admission weight has gone up to nearly 105 pounds with a BMI now of 19.47 -12/7 we will discontinue tube feedings today and as long as patient tolerates at least 50% of her regular diet plan to remove core track  tube on 12/8.  Continue Ensure feeding supplements Nutrition Problem: Inadequate oral intake Etiology: decreased appetite Signs/Symptoms: per patient/family report Interventions: Ensure Enlive (each supplement provides 350kcal and 20 grams of protein),Tube feeding Estimated body mass index is 18.75 kg/m as calculated from the following:   Height as of this encounter:  (1.575 m).   Weight as of this encounter: 46.5 kg.  Skin wound secondary to IV infiltration/right groin wound/right neck incision   11/30/19                        12/20/19  -ID also following along regarding this wound.  No indication to change current management and no indication to consult surgery -Completed 5 days of IV Toradol added to assist with acute pain now transition to ibuprofen as needed (patient on methadone candidate for supplemental narcotics for pain) -Continue gabapentin 300 mg TID for pain adjunct with improvement in pain Incision (Closed) 12/05/19 Neck Right (Active)  Date First Assessed/Time First Assessed: 12/05/19 1017   Location: Neck  Location Orientation: Right    Assessments 12/05/2019 12:00 PM 12/23/2019  7:32 AM  Dressing Type Gauze (Comment) Gauze (Comment)  Dressing Clean;Dry;Intact Clean;Intact;Dry  Dressing Change Frequency PRN PRN  Site / Wound Assessment Clean;Dry --  Margins -- Attached edges (approximated)  Closure -- Sutures  Drainage Amount None None  Treatment -- Off loading     No Linked orders to display     Incision (Closed) 12/05/19 Groin Right (Active)  Date First Assessed/Time First Assessed: 12/05/19 1017   Location: Groin  Location Orientation: Right    Assessments 12/05/2019 12:00 PM 12/19/2019  9:30 AM  Dressing Type Gauze (Comment) None  Dressing Dry;Intact;Clean --  Dressing Change Frequency PRN PRN  Site / Wound Assessment Clean;Dry --  Drainage Amount None --     No Linked orders to display     Wound / Incision (Open or Dehisced) 12/10/19 Non-pressure  wound Arm Left;Posterior;Lateral infiltration/phlebitis (Active)  Date First Assessed/Time First Assessed: 12/10/19 1915   Wound Type: Non-pressure wound  Location: Arm  Location Orientation: Left;Posterior;Lateral  Wound Description (Comments): infiltration/phlebitis    Assessments 12/10/2019 11:45 PM 12/23/2019  7:32 AM  Dressing Type Thin film;Non adherent;Gauze (Comment) Gauze (Comment)  Dressing Changed New Changed  Dressing Status Intact Clean;Dry;Intact  Dressing Change Frequency PRN Daily  Site / Wound Assessment Painful;Red;Bleeding --  Margins -- Unattached edges (unapproximated)  Closure None None  Drainage Amount Minimal Minimal  Drainage Description Serosanguineous Serosanguineous  Treatment Other (Comment) Cleansed;Other (Comment)     No Linked orders to display    Chronic IV drug abuse with opiates/recent withdrawal syndrome -Currently on methadone 20 mg every 8 hours (60 mg per 24 hours ) and will need to be on no more than milligrams every 24 hours at time of discharge.  I did discuss this with the patient and she is agreeable to tapering closer to discharge -Currently on twice daily clonazepam-we will titrate down to 0.25 mg BID as of 12/7.  Patient's mother is aware that  we will not be prescribing at discharge -[facility]; mother also investigating clinics and has been made aware why methadone needs to be no higher than 40 mg/day.  Plan is to give short supply of methadone to transition from date of discharge until clinic appointment available-cording to mother this will likely be within 48 to 72 hours after discharge  GAD -As noted above weaning and decreasing benzodiazepine -Discussed with patient's mother and as I have also noted patient has significant anxiety issues -12/10 we will start Lexapro 10 mg HS and plan to continue after discharge      Other problems: Anemia of chronic disease/recent sepsis related  thrombocytopenia -Thrombocytopenia has resolved -Hemoglobin stable around 8 -With initiation of NSAIDs daily Protonix has been started  Hyponatremia -Current sodium stable at 133  Hep C antibody positive with persistent transaminitis -Currently has nonobstructive transaminitis that has improved with improvement in nutrition -As of 12/3 albumin 2.0, AST 52, ALT normal at 42 and total bilirubin has been normal -HCVRNA quantitative pending   Data Reviewed: Basic Metabolic Panel: No results for input(s): NA, K, CL, CO2, GLUCOSE, BUN, CREATININE, CALCIUM, MG, PHOS in the last 168 hours. Liver Function Tests: No results for input(s): AST, ALT, ALKPHOS, BILITOT, PROT, ALBUMIN in the last 168 hours. No results for input(s): LIPASE, AMYLASE in the last 168 hours. No results for input(s): AMMONIA in the last 168 hours. CBC: Recent Labs  Lab 12/17/19 0250 12/18/19 0230 12/19/19 0315 12/20/19 0416  WBC 5.6 5.4 6.5 6.7  HGB 8.2* 7.9* 7.8* 8.2*  HCT 27.8* 26.8* 27.2* 28.2*  MCV 96.9 96.4 95.4 95.9  PLT 160 173 179 187   Cardiac Enzymes: Recent Labs  Lab 12/22/19 0535  CKTOTAL 14*   BNP (last 3 results) Recent Labs    11/30/19 1809  BNP 583.0*    ProBNP (last 3 results) No results for input(s): PROBNP in the last 8760 hours.  CBG: Recent Labs  Lab 12/22/19 2327 12/23/19 0346 12/23/19 0411 12/23/19 0729 12/23/19 0858  GLUCAP 70 67* 80 64* 132*    No results found for this or any previous visit (from the past 240 hour(s)).   Studies: MR Lumbar Spine W Wo Contrast  Result Date: 12/22/2019 CLINICAL DATA:  29 year old female with sepsis, MRSA bacteremia, endocarditis. Continued back pain, but no evidence of spinal infection on 12/08/2019 total spine MRI. EXAM: MRI LUMBAR SPINE WITHOUT AND WITH CONTRAST TECHNIQUE: Multiplanar and multiecho pulse sequences of the lumbar spine were obtained without and with intravenous contrast. CONTRAST:  5mL GADAVIST GADOBUTROL 1 MMOL/ML  IV SOLN COMPARISON:  Total spine MRI 12/08/2019, and earlier. FINDINGS: Segmentation: Lumbar segmentation appears to be normal and will be designated as such for this report. Alignment:  Stable lumbar lordosis. No spondylolisthesis. Vertebrae: Generalized decreased T1 and T2 marrow signal in the visible spine and pelvis, probably due to red marrow reactivation. No marrow edema or evidence of acute osseous abnormality. Visible SI joints appear normal. Conus medullaris and cauda equina: Conus extends to the L1 level. No lower spinal cord or conus signal abnormality. No abnormal intradural enhancement. No dural thickening. Normal cauda equina nerve roots. Paraspinal and other soft tissues: Suggestion of anasarca, body wall edema. Negative visible abdominal viscera. No focal lumbar paraspinal soft tissue inflammation or enhancement. Disc levels: Intervertebral disc signal and morphology remains normal from T11-T12 through L5-S1. No significant degenerative changes. No spinal or foraminal stenosis. IMPRESSION: 1. Stable, Negative MRI appearance of the Lumbar Spine aside  from probable red marrow reactivation throughout the visible spine and pelvis. 2. Suggestion of generalized body wall edema, anasarca. Electronically Signed   By: Odessa Fleming M.D.   On: 12/22/2019 15:47    Scheduled Meds: . chlorhexidine  15 mL Mouth Rinse BID  . Chlorhexidine Gluconate Cloth  6 each Topical Daily  . clonazepam  0.25 mg Oral BID  . collagenase   Topical Daily  . diclofenac Sodium  4 g Topical QID  . doxycycline  100 mg Oral Q12H  . feeding supplement  237 mL Oral TID BM  . gabapentin  300 mg Oral TID  . lidocaine  2 patch Transdermal Q24H  . mouth rinse  15 mL Mouth Rinse q12n4p  . methadone  20 mg Oral Q8H  . pantoprazole  40 mg Oral Daily  . sodium chloride flush  10-40 mL Intracatheter Q12H  . sodium chloride flush  10-40 mL Intracatheter Q12H   Continuous Infusions: . sodium chloride 10 mL/hr at 12/05/19 0617  .  sodium chloride Stopped (12/19/19 2156)  . DAPTOmycin (CUBICIN)  IV 500 mg (12/22/19 2031)    Principal Problem:   MRSA infection Active Problems:   Septic shock (HCC)   Opioid use with withdrawal (HCC)   Endocarditis of tricuspid valve   Palliative care encounter   Consultants:  PCCM  Infectious disease  CVTS  Cardiology  Palliative medicine  Procedures:  10/22 echocardiogram  10/24 TEE  11/17 complete echocardiogram  11/22 intraoperative TEE/application of angio [facility]  11/24 core track  11/29 Limited echocardiogram  Antibiotics: Anti-infectives (From admission, onward)   Start     Dose/Rate Route Frequency Ordered Stop   12/13/19 1115  doxycycline (VIBRA-TABS) tablet 100 mg        100 mg Oral Every 12 hours 12/13/19 1027 01/16/20 2359   12/07/19 2000  DAPTOmycin (CUBICIN) 500 mg in sodium chloride 0.9 % IVPB        500 mg 220 mL/hr over 30 Minutes Intravenous Daily 12/06/19 1345 01/16/20 2359   12/02/19 1400  vancomycin (VANCOREADY) IVPB 500 mg/100 mL  Status:  Discontinued        500 mg 100 mL/hr over 60 Minutes Intravenous Every 8 hours 12/02/19 1127 12/02/19 1131   12/02/19 1400  vancomycin (VANCOREADY) IVPB 750 mg/150 mL  Status:  Discontinued        750 mg 150 mL/hr over 60 Minutes Intravenous Every 8 hours 12/02/19 1131 12/06/19 1339   12/01/19 1200  vancomycin (VANCOREADY) IVPB 500 mg/100 mL  Status:  Discontinued        500 mg 100 mL/hr over 60 Minutes Intravenous Every 24 hours 11/30/19 1454 12/01/19 0946   12/01/19 1200  vancomycin (VANCOCIN) IVPB 1000 mg/200 mL premix  Status:  Discontinued        1,000 mg 200 mL/hr over 60 Minutes Intravenous Every 24 hours 12/01/19 0946 12/02/19 1127   11/30/19 1800  piperacillin-tazobactam (ZOSYN) IVPB 3.375 g  Status:  Discontinued        3.375 g 12.5 mL/hr over 240 Minutes Intravenous Every 8 hours 11/30/19 1454 12/02/19 0234   11/30/19 1115  piperacillin-tazobactam (ZOSYN) IVPB 3.375 g        3.375  g 100 mL/hr over 30 Minutes Intravenous  Once 11/30/19 1110 11/30/19 1306   11/30/19 1115  vancomycin (VANCOCIN) IVPB 1000 mg/200 mL premix        1,000 mg 200 mL/hr over 60 Minutes Intravenous  Once 11/30/19 1110 11/30/19 1337  Time spent: 20 minutes    [name] ANP  [facility] Pager [phone].

## 2019-12-23 NOTE — Progress Notes (Signed)
CBG- 60 mg/dl. Snacks offered took ice cream and sprite. Ensure also given. Dinner served  Tolerated 50 %. NP made aware . Continue to monitor.

## 2019-12-23 NOTE — Progress Notes (Signed)
Physical Therapy Treatment Patient Details Name: Amber [name] MRN: [id number] DOB: 1990-02-21 Today's Date: 12/23/2019    History of Present Illness 29 yo admitted 11/17 with septic shock due to MRSA endocarditis with IVDU fentanyl and heroin day of admission. PMhx: admission 10/23-10/26 for endocarditis left AMA, IVDU, cellulitis, depression    PT Comments    Pt sound asleep on entry, when awaken c/o of pain in her back. RN provides lidocaine patch prior to ambulation. With max encouragement pt ambulates in hallway, however has dry cough for the duration and requests to return to room. Pt continues to be self limiting. D/c plans remain appropriate. PT will continue to follow acutely.     Follow Up Recommendations  Home health PT;Supervision/Assistance - 24 hour     Equipment Recommendations  Rolling walker with 5" wheels;3in1 (PT)    Recommendations for Other Services OT consult     Precautions / Restrictions Precautions Precautions: Fall    Mobility  Bed Mobility Overal bed mobility: Needs Assistance Bed Mobility: Supine to Sit;Sit to Supine     Supine to sit: Supervision;HOB elevated Sit to supine: Supervision   General bed mobility comments: Supervision for safety; pt elevating HOB  Transfers Overall transfer level: Needs assistance Equipment used: Rolling walker (2 wheeled) Transfers: Sit to/from Stand Sit to Stand: Supervision         General transfer comment: supervision for safety, started to reach up to RW but then self corrected to push off from bed  Ambulation/Gait Ambulation/Gait assistance: Min guard Gait Distance (Feet): 180 Feet Assistive device: Rolling walker (2 wheeled) Gait Pattern/deviations: Step-through pattern;Decreased stride length;Shuffle;Trunk flexed Gait velocity: slowed Gait velocity interpretation: <1.8 ft/sec, indicate of risk for recurrent falls General Gait Details: min guard for safety, limited by constant non-productive  coughing asked to return to bed after limited ambulation         Balance Overall balance assessment: Needs assistance Sitting-balance support: No upper extremity supported;Feet supported Sitting balance-[name]: Good     Standing balance support: No upper extremity supported;During functional activity Standing balance-[name]: Fair Standing balance comment: able to stand at sink and brush teeth without UE support                            Cognition Arousal/Alertness: Awake/alert Behavior During Therapy: Flat affect;Anxious Overall Cognitive Status: Impaired/Different from baseline                               Problem Solving: Slow processing;Requires verbal cues General Comments: anxious about moving because she is having increasesed back pain         General Comments General comments (skin integrity, edema, etc.): HR in 130s with ambulation, ambulated on 3L O2 via [place]      Pertinent Vitals/Pain Pain Assessment: 0-10 Pain Score: 8  Pain Location: back Pain Descriptors / Indicators: Aching;Guarding;Stabbing Pain Intervention(s): Limited activity within patient's tolerance;Monitored during session;Repositioned           PT Goals (current goals can now be found in the care plan section) Acute Rehab PT Goals Patient Stated Goal: Stop coughing PT Goal Formulation: With patient Time For Goal Achievement: 12/23/19 Potential to Achieve Goals: Fair Progress towards PT goals: Not progressing toward goals - comment (limited by increased coughing with gait)    Frequency    Min 3X/week      PT Plan Current plan remains  appropriate       AM-PAC PT "6 Clicks" Mobility   Outcome Measure  Help needed turning from your back to your side while in a flat bed without using bedrails?: None Help needed moving from lying on your back to sitting on the side of a flat bed without using bedrails?: A Little Help needed moving to and from a bed  to a chair (including a wheelchair)?: A Little Help needed standing up from a chair using your arms (e.g., wheelchair or bedside chair)?: A Little Help needed to walk in hospital room?: A Little Help needed climbing 3-5 steps with a railing? : A Little 6 Click Score: 19    End of Session Equipment Utilized During Treatment: Oxygen;Gait belt (3L [place]) Activity Tolerance: Patient tolerated treatment well Patient left: in bed;with call bell/phone within reach Nurse Communication: Mobility status PT Visit Diagnosis: Other abnormalities of gait and mobility (R26.89);Muscle weakness (generalized) (M62.81);Pain Pain - Right/Left: Right Pain - part of body:  (ribs, back)     Time: 4854-6270 PT Time Calculation (min) (ACUTE ONLY): 31 min  Charges:  $Therapeutic Exercise: 8-22 mins                     Archita Lomeli B. Beverely Risen PT, DPT Acute Rehabilitation Services Pager [phone] Office [phone]    Elon Alas Fleet 12/23/2019, 3:17 PM

## 2019-12-24 LAB — CBC
HCT: 25.8 % — ABNORMAL LOW (ref 36.0–46.0)
Hemoglobin: 7.6 g/dL — ABNORMAL LOW (ref 12.0–15.0)
MCH: 27 pg (ref 26.0–34.0)
MCHC: 29.5 g/dL — ABNORMAL LOW (ref 30.0–36.0)
MCV: 91.8 fL (ref 80.0–100.0)
Platelets: 257 10*3/uL (ref 150–400)
RBC: 2.81 MIL/uL — ABNORMAL LOW (ref 3.87–5.11)
RDW: 21.6 % — ABNORMAL HIGH (ref 11.5–15.5)
WBC: 6.6 10*3/uL (ref 4.0–10.5)
nRBC: 0 % (ref 0.0–0.2)

## 2019-12-24 LAB — BASIC METABOLIC PANEL
Anion gap: 8 (ref 5–15)
BUN: 6 mg/dL (ref 6–20)
CO2: 28 mmol/L (ref 22–32)
Calcium: 8.5 mg/dL — ABNORMAL LOW (ref 8.9–10.3)
Chloride: 100 mmol/L (ref 98–111)
Creatinine, Ser: 0.35 mg/dL — ABNORMAL LOW (ref 0.44–1.00)
GFR, Estimated: >60 mL/min (ref >60)
Glucose, Bld: 76 mg/dL (ref 70–99)
Potassium: 3.8 mmol/L (ref 3.5–5.1)
Sodium: 136 mmol/L (ref 135–145)

## 2019-12-24 LAB — GLUCOSE, CAPILLARY
Glucose-Capillary: 64 mg/dL — ABNORMAL LOW (ref 70–99)
Glucose-Capillary: 107 mg/dL — ABNORMAL HIGH (ref 70–99)
Glucose-Capillary: 121 mg/dL — ABNORMAL HIGH (ref 70–99)
Glucose-Capillary: 78 mg/dL (ref 70–99)
Glucose-Capillary: 80 mg/dL (ref 70–99)
Glucose-Capillary: 118 mg/dL — ABNORMAL HIGH (ref 70–99)

## 2019-12-24 MED ORDER — IBUPROFEN 400 MG PO TABS
400.0000 mg | ORAL_TABLET | Freq: Four times a day (QID) | ORAL | Status: DC | PRN
  Administered 2020-01-03 – 2020-01-06 (×3): 400 mg via ORAL
  Filled 2019-12-24 (×3): qty 1

## 2019-12-24 NOTE — Progress Notes (Signed)
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  In the ER, she was noted to be hypertensive with anemia.  She was started on vasopressors, blood transfusion, and empiric antibiotics and originally admitted to the critical care service for infective endocarditis, MRSA bacteremia, septic pulmonary emboli.  Patient underwent debridement of right atrial mass and debridement of tricuspid valve vegetation by CTS on 12/05/2019.  Infectious disease was consulted and patient's antibiotics were deescalated to daptomycin and doxycycline.  Patient was transferred to hospitalist service on 12/10/2019.  Patient remains hospitalized to complete a 6-week IV antibiotic course for endocarditis in the setting of persistent IV drug abuse.   Assessment & Plan:   Principal Problem:   MRSA infection Active Problems:   Septic shock (HCC)   Opioid use with withdrawal (HCC)   Endocarditis of tricuspid valve   Palliative care encounter   MRSA septicemia with shock, present on admission Infective endocarditis Tricuspid valve vegetation Septic pulmonary emboli Patient representing to the ED after being found obtunded by friends following multiple AMA discharges with infective endocarditis complicated by tricuspid valve vegetation and septic pulmonary emboli initially requiring vasopressors in the ICU.  Patient underwent angio Vac procedure on 12/05/2019 by CTS.  Seen by ID, recommend --Daptomycin --Continue doxycycline for pulmonary coverage in the setting of septic emboli to lungs --Continue supplemental oxygen, titrate to maintain SPO2 greater than 92%, on 2 L nasal  cannula --Weekly CK level --Planned completion of antibiotics 01/16/2020  Left arm wound secondary to IV infiltration --Continue antibiotics with daptomycin and doxycycline as above --Continue local wound care  Hepatitis C antibody positive --Outpatient follow-up with infectious disease  Opioid use disorder --Methadone 20 mg p.o. every 8 hours  Anxiety/depression --Lexapro 10 mg p.o. nightly --Clonazepam 0.25 mg p.o. twice daily  GERD: Continue PPI  Weakness, deconditioning, gait disturbance --Continue PT/OT efforts while inpatient  Severe protein calorie malnutrition Body mass index is 18.19 kg/m. Nutrition Status: Nutrition Problem: Inadequate oral intake Etiology: decreased appetite Signs/Symptoms: per patient/family report Interventions: Ensure Enlive (each supplement provides 350kcal and 20 grams of protein),Tube feeding  BMI initially 17 on presentation requiring tube feeds due to poor oral intake and septic shock, requiring vasopressors and mechanical ventilation.  Also refeeding syndrome noted with abnormalities with potassium, magnesium and phosphorus earlier in hospitalization.  Continues with poor oral intake, but slightly improved.  Patient is very thin and cachectic in appearance; likely related to her chronic disease process with continued IV drug abuse and infectious endocarditis. --Continue to encourage increased oral intake with nutritional supplements now off tube feeds --Dietitian following, appreciate assistance   DVT prophylaxis: Ambulation Code Status: Full code Family Communication: No family present at bedside this morning  Disposition Plan:  Status is: Inpatient  Remains inpatient appropriate because:Unsafe d/c plan, IV treatments appropriate due to intensity of illness or inability to take PO and Inpatient level of care appropriate due to severity of illness   Dispo: The patient is from: Home              Anticipated d/c is to: Home               Anticipated d/c date  is: > 3 days              Patient currently is not medically stable to d/c.  Needs to complete 6-week IV antibiotic course inpatient as unsafe discharge with PICC line given her current IV drug abuse    Consultants:   Infectious disease  PCCM  Cardiothoracic surgery  Procedures:   Angio VAC with debridement of left atrial mass/tricuspid vegetation, cardiothoracic surgery, Dr. Cliffton Asters 12/05/2019  Antimicrobials:   Daptomycin  Doxycycline   Subjective: Patient seen and examined at bedside, resting comfortably.  Eating breakfast.  States feels like she is still losing weight.  Energy is slightly improved.  Overall feels slightly better each day.  Asking about discharge date with concerns of starting her methadone clinic.  No other questions or concerns at this time.  Denies headache, no fever/chills/night sweats, no nausea/vomiting/diarrhea, no chest pain, palpitations, no abdominal pain.  No acute events overnight per nursing staff.  Objective: Vitals:   12/23/19 2302 12/24/19 0322 12/24/19 0506 12/24/19 0806  BP: 106/74 94/67    Pulse: (!) 108 99  (!) 102  Resp: 20 14  18   Temp: 99 F (37.2 C) 98.5 F (36.9 C)  98.4 F (36.9 C)  TempSrc: Oral Oral  Oral  SpO2: 97% 100%  100%  Weight:   45.1 kg   Height:        Intake/Output Summary (Last 24 hours) at 12/24/2019 1107 Last data filed at 12/24/2019 14/11/2019 Gross per 24 hour  Intake 964 ml  Output 350 ml  Net 614 ml   Filed Weights   12/22/19 0500 12/23/19 0510 12/24/19 0506  Weight: 47.9 kg 46.5 kg 45.1 kg    Examination:  General exam: Appears calm and comfortable, thin and cachectic in appearance Respiratory system: Clear to auscultation. Respiratory effort normal.  On 2 L nasal cannula Cardiovascular system: S1 & S2 heard, RRR. No JVD, murmurs, rubs, gallops or clicks. No pedal edema. Gastrointestinal system: Abdomen is nondistended, soft and nontender. No organomegaly or masses felt.  Normal bowel sounds heard. Central nervous system: Alert and oriented. No focal neurological deficits. Extremities: Symmetric 5 x 5 power.  Noted muscle wasting Skin: No rashes, lesions or ulcers Psychiatry: Judgement and insight appear normal. Mood & affect appropriate.     Data Reviewed: I have personally reviewed following labs and imaging studies  CBC: Recent Labs  Lab 12/18/19 0230 12/19/19 0315 12/20/19 0416 12/24/19 0325  WBC 5.4 6.5 6.7 6.6  HGB 7.9* 7.8* 8.2* 7.6*  HCT 26.8* 27.2* 28.2* 25.8*  MCV 96.4 95.4 95.9 91.8  PLT 173 179 187 257   Basic Metabolic Panel: Recent Labs  Lab 12/24/19 0325  NA 136  K 3.8  CL 100  CO2 28  GLUCOSE 76  BUN 6  CREATININE 0.35*  CALCIUM 8.5*   GFR: Estimated Creatinine Clearance: 73.9 mL/min (A) (by C-G formula based on SCr of 0.35 mg/dL (L)). Liver Function Tests: No results for input(s): AST, ALT, ALKPHOS, BILITOT, PROT, ALBUMIN in the last 168 hours. No results for input(s): LIPASE, AMYLASE in the last 168 hours. No results for input(s): AMMONIA in the last 168 hours. Coagulation Profile: No results for input(s): INR, PROTIME in the last 168 hours. Cardiac Enzymes: Recent Labs  Lab 12/22/19 0535  CKTOTAL 14*   BNP (last 3 results) No results for input(s): PROBNP in the last 8760 hours. HbA1C: No results for input(s): HGBA1C in the last 72 hours. CBG: Recent Labs  Lab 12/23/19 2012  12/23/19 2300 12/24/19 0357 12/24/19 0437 12/24/19 0810  GLUCAP 94 129* 64* 107* 121*   Lipid Profile: No results for input(s): CHOL, HDL, LDLCALC, TRIG, CHOLHDL, LDLDIRECT in the last 72 hours. Thyroid Function Tests: No results for input(s): TSH, T4TOTAL, FREET4, T3FREE, THYROIDAB in the last 72 hours. Anemia Panel: No results for input(s): VITAMINB12, FOLATE, FERRITIN, TIBC, IRON, RETICCTPCT in the last 72 hours. Sepsis Labs: No results for input(s): PROCALCITON, LATICACIDVEN in the last 168 hours.  No results found for  this or any previous visit (from the past 240 hour(s)).       Radiology Studies: MR Lumbar Spine W Wo Contrast  Result Date: 12/22/2019 CLINICAL DATA:  29 year old female with sepsis, MRSA bacteremia, endocarditis. Continued back pain, but no evidence of spinal infection on 12/08/2019 total spine MRI. EXAM: MRI LUMBAR SPINE WITHOUT AND WITH CONTRAST TECHNIQUE: Multiplanar and multiecho pulse sequences of the lumbar spine were obtained without and with intravenous contrast. CONTRAST:  45mL GADAVIST GADOBUTROL 1 MMOL/ML IV SOLN COMPARISON:  Total spine MRI 12/08/2019, and earlier. FINDINGS: Segmentation: Lumbar segmentation appears to be normal and will be designated as such for this report. Alignment:  Stable lumbar lordosis. No spondylolisthesis. Vertebrae: Generalized decreased T1 and T2 marrow signal in the visible spine and pelvis, probably due to red marrow reactivation. No marrow edema or evidence of acute osseous abnormality. Visible SI joints appear normal. Conus medullaris and cauda equina: Conus extends to the L1 level. No lower spinal cord or conus signal abnormality. No abnormal intradural enhancement. No dural thickening. Normal cauda equina nerve roots. Paraspinal and other soft tissues: Suggestion of anasarca, body wall edema. Negative visible abdominal viscera. No focal lumbar paraspinal soft tissue inflammation or enhancement. Disc levels: Intervertebral disc signal and morphology remains normal from T11-T12 through L5-S1. No significant degenerative changes. No spinal or foraminal stenosis. IMPRESSION: 1. Stable, Negative MRI appearance of the Lumbar Spine aside from probable red marrow reactivation throughout the visible spine and pelvis. 2. Suggestion of generalized body wall edema, anasarca. Electronically Signed   By: Odessa Fleming M.D.   On: 12/22/2019 15:47        Scheduled Meds: . chlorhexidine  15 mL Mouth Rinse BID  . Chlorhexidine Gluconate Cloth  6 each Topical Daily  .  clonazepam  0.25 mg Oral BID  . collagenase   Topical Daily  . diclofenac Sodium  4 g Topical QID  . doxycycline  100 mg Oral Q12H  . escitalopram  10 mg Oral QHS  . feeding supplement  237 mL Oral TID BM  . gabapentin  300 mg Oral TID  . lidocaine  2 patch Transdermal Q24H  . mouth rinse  15 mL Mouth Rinse q12n4p  . methadone  20 mg Oral Q8H  . pantoprazole  40 mg Oral Daily  . sodium chloride flush  10-40 mL Intracatheter Q12H  . sodium chloride flush  10-40 mL Intracatheter Q12H   Continuous Infusions: . sodium chloride 10 mL/hr at 12/05/19 0617  . sodium chloride Stopped (12/19/19 2156)  . DAPTOmycin (CUBICIN)  IV Stopped (12/23/19 2044)     LOS: 24 days    Time spent: 36 minutes spent on chart review, discussion with nursing staff, consultants, updating family and interview/physical exam; more than 50% of that time was spent in counseling and/or coordination of care.    Alvira Philips [place], DO [facility] Available via Epic secure chat 7am-7pm After these hours, please refer to coverage provider listed on [email or web address] 12/24/2019, 11:07 AM

## 2019-12-24 NOTE — Plan of Care (Signed)

## 2019-12-25 LAB — GLUCOSE, CAPILLARY
Glucose-Capillary: 101 mg/dL — ABNORMAL HIGH (ref 70–99)
Glucose-Capillary: 106 mg/dL — ABNORMAL HIGH (ref 70–99)
Glucose-Capillary: 130 mg/dL — ABNORMAL HIGH (ref 70–99)
Glucose-Capillary: 66 mg/dL — ABNORMAL LOW (ref 70–99)
Glucose-Capillary: 70 mg/dL (ref 70–99)
Glucose-Capillary: 86 mg/dL (ref 70–99)
Glucose-Capillary: 90 mg/dL (ref 70–99)

## 2019-12-25 NOTE — Progress Notes (Signed)
PROGRESS NOTE    [name]  IPJ:[id number] DOB: 09-27-1990 DOA: 11/30/2019 PCP: Patient, No Pcp Per    Brief Narrative:  Amber [name] is a 29 year old female with past medical history significant for current IV drug abuse with known tricuspid MRSA endocarditis with multiple AMA discharges who he presented to the ED after being brought by friends after being found obtunded.  In the ER, she was noted to be hypertensive with anemia.  She was started on vasopressors, blood transfusion, and empiric antibiotics and originally admitted to the critical care service for infective endocarditis, MRSA bacteremia, septic pulmonary emboli.  Patient underwent debridement of right atrial mass and debridement of tricuspid valve vegetation by CTS on 12/05/2019.  Infectious disease was consulted and patient's antibiotics were deescalated to daptomycin and doxycycline.  Patient was transferred to hospitalist service on 12/10/2019.  Patient remains hospitalized to complete a 6-week IV antibiotic course for endocarditis in the setting of persistent IV drug abuse.   Assessment & Plan:   Principal Problem:   MRSA infection Active Problems:   Septic shock (HCC)   Opioid use with withdrawal (HCC)   Endocarditis of tricuspid valve   Palliative care encounter   MRSA septicemia with shock, present on admission Infective endocarditis Tricuspid valve vegetation Septic pulmonary emboli Patient representing to the ED after being found obtunded by friends following multiple AMA discharges with infective endocarditis complicated by tricuspid valve vegetation and septic pulmonary emboli initially requiring vasopressors in the ICU.  Patient underwent angio Vac procedure on 12/05/2019 by CTS.  Seen by ID, recommend --Daptomycin --Continue doxycycline for pulmonary coverage in the setting of septic emboli to lungs --Continue supplemental oxygen, titrate to maintain SPO2 greater than 92%, on 2 L nasal  cannula --Weekly CK level; 14 on 12/22/2019 --Planned completion of antibiotics 01/16/2020  Left arm wound secondary to IV infiltration --Continue antibiotics with daptomycin and doxycycline as above --Continue local wound care  Hepatitis C antibody positive --Outpatient follow-up with infectious disease  Opioid use disorder --Methadone 20 mg p.o. every 8 hours  Anxiety/depression --Lexapro 10 mg p.o. nightly --Clonazepam 0.25 mg p.o. twice daily  GERD: Continue PPI  Weakness, deconditioning, gait disturbance --Continue PT/OT efforts while inpatient  Severe protein calorie malnutrition Body mass index is 18.27 kg/m. Nutrition Status: Nutrition Problem: Inadequate oral intake Etiology: decreased appetite Signs/Symptoms: per patient/family report Interventions: Ensure Enlive (each supplement provides 350kcal and 20 grams of protein),Tube feeding  BMI initially 17 on presentation requiring tube feeds due to poor oral intake and septic shock, requiring vasopressors and mechanical ventilation.  Also refeeding syndrome noted with abnormalities with potassium, magnesium and phosphorus earlier in hospitalization.  Continues with poor oral intake, but slightly improved.  Patient is very thin and cachectic in appearance; likely related to her chronic disease process with continued IV drug abuse and infectious endocarditis. --Continue to encourage increased oral intake with nutritional supplements now off tube feeds --Dietitian following, appreciate assistance   DVT prophylaxis: Ambulation Code Status: Full code Family Communication: No family present at bedside this morning  Disposition Plan:  Status is: Inpatient  Remains inpatient appropriate because:Unsafe d/c plan, IV treatments appropriate due to intensity of illness or inability to take PO and Inpatient level of care appropriate due to severity of illness   Dispo: The patient is from: Home              Anticipated d/c is to:  Home  Anticipated d/c date is: > 3 days              Patient currently is not medically stable to d/c.  Needs to complete 6-week IV antibiotic course inpatient as unsafe discharge with PICC line given her current IV drug abuse    Consultants:   Infectious disease  PCCM  Cardiothoracic surgery  Procedures:   Angio VAC with debridement of left atrial mass/tricuspid vegetation, cardiothoracic surgery, Dr. Cliffton Asters 12/05/2019  Antimicrobials:   Daptomycin  Doxycycline   Subjective: Patient seen and examined at bedside, resting comfortably.  Does not like the breakfast this morning.  Requesting assistance with ambulation in the halls today.  No other complaints or concerns at this time. Denies headache, no fever/chills/night sweats, no nausea/vomiting/diarrhea, no chest pain, palpitations, no abdominal pain.  No acute events overnight per nursing staff.  Objective: Vitals:   12/24/19 2003 12/24/19 2135 12/25/19 0041 12/25/19 0305  BP: 100/68 107/79 96/73 100/67  Pulse: (!) 104  97   Resp: 20 18 17 16   Temp: 99.4 F (37.4 C)  98.7 F (37.1 C)   TempSrc: Oral  Oral   SpO2: 98% 100% 100% 100%  Weight:   45.3 kg   Height:        Intake/Output Summary (Last 24 hours) at 12/25/2019 0831 Last data filed at 12/24/2019 1700 Gross per 24 hour  Intake 250 ml  Output --  Net 250 ml   Filed Weights   12/23/19 0510 12/24/19 0506 12/25/19 0041  Weight: 46.5 kg 45.1 kg 45.3 kg    Examination:  General exam: Appears calm and comfortable, thin and cachectic in appearance Respiratory system: Clear to auscultation. Respiratory effort normal.  On 2 L nasal cannula Cardiovascular system: S1 & S2 heard, RRR. No JVD, murmurs, rubs, gallops or clicks. No pedal edema. Gastrointestinal system: Abdomen is nondistended, soft and nontender. No organomegaly or masses felt. Normal bowel sounds heard. Central nervous system: Alert and oriented. No focal neurological  deficits. Extremities: Symmetric 5 x 5 power.  Noted muscle wasting Skin: No rashes, lesions or ulcers Psychiatry: Judgement and insight appear normal. Mood & affect appropriate.     Data Reviewed: I have personally reviewed following labs and imaging studies  CBC: Recent Labs  Lab 12/19/19 0315 12/20/19 0416 12/24/19 0325  WBC 6.5 6.7 6.6  HGB 7.8* 8.2* 7.6*  HCT 27.2* 28.2* 25.8*  MCV 95.4 95.9 91.8  PLT 179 187 257   Basic Metabolic Panel: Recent Labs  Lab 12/24/19 0325  NA 136  K 3.8  CL 100  CO2 28  GLUCOSE 76  BUN 6  CREATININE 0.35*  CALCIUM 8.5*   GFR: Estimated Creatinine Clearance: 74.2 mL/min (A) (by C-G formula based on SCr of 0.35 mg/dL (L)). Liver Function Tests: No results for input(s): AST, ALT, ALKPHOS, BILITOT, PROT, ALBUMIN in the last 168 hours. No results for input(s): LIPASE, AMYLASE in the last 168 hours. No results for input(s): AMMONIA in the last 168 hours. Coagulation Profile: No results for input(s): INR, PROTIME in the last 168 hours. Cardiac Enzymes: Recent Labs  Lab 12/22/19 0535  CKTOTAL 14*   BNP (last 3 results) No results for input(s): PROBNP in the last 8760 hours. HbA1C: No results for input(s): HGBA1C in the last 72 hours. CBG: Recent Labs  Lab 12/24/19 1610 12/24/19 2030 12/25/19 0006 12/25/19 0411 12/25/19 0507  GLUCAP 80 118* 70 66* 90   Lipid Profile: No results for input(s): CHOL, HDL, LDLCALC, TRIG, CHOLHDL, LDLDIRECT  in the last 72 hours. Thyroid Function Tests: No results for input(s): TSH, T4TOTAL, FREET4, T3FREE, THYROIDAB in the last 72 hours. Anemia Panel: No results for input(s): VITAMINB12, FOLATE, FERRITIN, TIBC, IRON, RETICCTPCT in the last 72 hours. Sepsis Labs: No results for input(s): PROCALCITON, LATICACIDVEN in the last 168 hours.  No results found for this or any previous visit (from the past 240 hour(s)).       Radiology Studies: No results found.      Scheduled Meds: .  chlorhexidine  15 mL Mouth Rinse BID  . Chlorhexidine Gluconate Cloth  6 each Topical Daily  . clonazepam  0.25 mg Oral BID  . collagenase   Topical Daily  . diclofenac Sodium  4 g Topical QID  . doxycycline  100 mg Oral Q12H  . escitalopram  10 mg Oral QHS  . feeding supplement  237 mL Oral TID BM  . gabapentin  300 mg Oral TID  . lidocaine  2 patch Transdermal Q24H  . mouth rinse  15 mL Mouth Rinse q12n4p  . methadone  20 mg Oral Q8H  . pantoprazole  40 mg Oral Daily  . sodium chloride flush  10-40 mL Intracatheter Q12H  . sodium chloride flush  10-40 mL Intracatheter Q12H   Continuous Infusions: . sodium chloride 10 mL/hr at 12/05/19 0617  . sodium chloride Stopped (12/19/19 2156)  . DAPTOmycin (CUBICIN)  IV 500 mg (12/24/19 2000)     LOS: 25 days    Time spent: 35 minutes spent on chart review, discussion with nursing staff, consultants, updating family and interview/physical exam; more than 50% of that time was spent in counseling and/or coordination of care.    Alvira Philips [place], DO [facility] Available via Epic secure chat 7am-7pm After these hours, please refer to coverage provider listed on [email or web address] 12/25/2019, 8:31 AM

## 2019-12-25 NOTE — Plan of Care (Signed)

## 2019-12-26 LAB — GLUCOSE, CAPILLARY
Glucose-Capillary: 109 mg/dL — ABNORMAL HIGH (ref 70–99)
Glucose-Capillary: 61 mg/dL — ABNORMAL LOW (ref 70–99)
Glucose-Capillary: 68 mg/dL — ABNORMAL LOW (ref 70–99)
Glucose-Capillary: 69 mg/dL — ABNORMAL LOW (ref 70–99)
Glucose-Capillary: 87 mg/dL (ref 70–99)
Glucose-Capillary: 97 mg/dL (ref 70–99)

## 2019-12-26 MED ORDER — COLLAGENASE 250 UNIT/GM EX OINT
TOPICAL_OINTMENT | Freq: Every day | CUTANEOUS | Status: DC
  Administered 2020-01-10: 1 via TOPICAL
  Filled 2019-12-26 (×3): qty 30

## 2019-12-26 MED ORDER — CLONAZEPAM 0.25 MG PO TBDP
0.2500 mg | ORAL_TABLET | Freq: Every day | ORAL | Status: DC
  Administered 2019-12-27 – 2020-01-11 (×15): 0.25 mg via ORAL
  Filled 2019-12-26 (×15): qty 1

## 2019-12-26 NOTE — Plan of Care (Signed)

## 2019-12-26 NOTE — Consult Note (Signed)
WOC Nurse wound follow up Patient receiving care in [facility] 2C11. Wound type: IV infiltration site to LAC area Measurement: 9 cm x 7 cm x no measurable depth Wound bed: pink with thin layer of yellow slough in scattered areas. Drainage (amount, consistency, odor) none Periwound: intact Dressing procedure/placement/frequency: Order for the following was re-entered: Apply 1/4" thick layer of Santyl to wound bed of the left forearm, top with vaseline gauze. Top with dry dressing. Secure with kerlix. Change daily. The patient and talked about the importance of healthy foods, adequate calories and lean proteins to facilitate wound healing. Patient verbalized her understanding. Helmut Muster, RN, MSN, CWOCN, CNS-BC, pager [phone]

## 2019-12-26 NOTE — Progress Notes (Addendum)
[facility] PROGRESS NOTE  REI [name]:[id number] DOB: [birth year] DOA: 11/30/2019 PCP: Amber [name], No Pcp Per  Status: Remains inpatient appropriate because:Ongoing active pain requiring inpatient pain management, Ongoing diagnostic testing needed not appropriate for outpatient work up, Unsafe d/c plan, IV treatments appropriate due to intensity of illness or inability to take PO and Inpatient level of care appropriate due to severity of illness   Dispo: The Amber [name] is from: Home              Anticipated d/c is to: Home              Anticipated d/c date is: > 3 days (01/16/20)              Amber [name] currently is not medically stable to d/c.  Amber [name]'s primary reason for remaining in the hospital is to complete 6 weeks of IV and oral antibiotics for her endocarditis with septic emboli .  She is currently requiring O2 2/2 sequelae from septic emboli.  Code Status: Full Family Communication: Amber [name]; mother at bedside on 12/7 again updated mother by telephone on 12/9 regarding clarification of anticipated discharge date on 01/16/2020 DVT prophylaxis: SCDs 2/2 recent thrombocytopenia due to sepsis Vaccination status: Has not been vaccinated against Covid  Foley catheter: No  HPI: 29 year old female Amber [name] with known IV drug abuse with known tricuspid MRSA endocarditis who signed out AMA x2 prior to this admission.  She presented with progressive infection.  On 10/21 through 10/23 she was admitted with fever with blood cultures positive for MRSA.  CT of the chest demonstrated septic emboli and edematous L4-5 facet joint (? Evolving osteomyelitis) as well as tricuspid vegetation on CT.  She left AMA.  She returned 2 hours later on 10/23 with severe back pain.  Underwent TEE that demonstrated a 1 cm tricuspid valve mass.  She was evaluated by CT surgery who recommended medical management.  Again she left AMA on 10/25.  Amber [name] was staying with friends but presented back to the hospital due to  increasing shortness of breath, weakness, fatigue malaise and nausea.  She arrived in the parking lot obtunded and was found to have multiple needles in her car on 11/17.  In the ER she was hypotensive and started on pressors.  Hemoglobin was low and she was given a blood transfusion.  She was also started on empiric IV vancomycin for known MRSA infection  and was admitted to the critical care service.  She eventually was taken to the OR for angio VAC during this admission.  She has subsequently transitioned out of ICU to stepdown unit.  She has had an IV infiltration and has a superficial wound on her left forearm. ID continues to follow regarding the endocarditis and recommends a total of 6 weeks therapy be daptomycin.  She has continued to have hypoxemia and productive cough and on 11/30 ID added doxycycline.  She is reporting pleuritic chest pain as well.  She is also very malnourished and was started on tube feedings and developed mild electrolyte disturbances from refeeding syndrome which have resolved.  She has low-grade resting tachycardia with no significant abnormalities on echo and cardiology previously did not suspect cardiac etiology to her tachycardia.  Hepatitis C antibody has been positive; she is HIV negative.  She has had persistent elevation in her LFTs.  She is currently on methadone 20 mg 3 times a day but will need to be on no more than 40 mg per 24 hours at time of  discharge to be eligible for outpatient methadone treatment.  Subjective: Amber [name] awakened.  No specific complaints.  Did have questions regarding discharge meds.  Objective: Vitals:   12/25/19 2206 12/26/19 0317  BP: 116/67 94/62  Pulse: 92 93  Resp: 17 19  Temp: 97.7 F (36.5 C) 98 F (36.7 C)  SpO2: 100% 100%   No intake or output data in the 24 hours ending 12/26/19 1047 Filed Weights   12/24/19 0506 12/25/19 0041 12/26/19 0317  Weight: 45.1 kg 45.3 kg 44.2 kg    Exam:  Constitutional: NAD, calm,  comfortable Respiratory: Coarse to auscultation bilaterally overall improved air movement.  2 L O2.  Normal respiratory effort at rest. No accessory muscle use.  Cardiovascular: Regular rate and rhythm, no murmurs / rubs / gallops. No extremity edema.  Good capillary refill Abdomen: no tenderness, Bowel sounds positive. LBM 12/11 Musculoskeletal: no clubbing / cyanosis. Good ROM, Normal muscle tone.   Skin: Left forearm dressing clean dry and intact. Neurologic: CN 2-12 grossly intact. Sensation intact, Strength 5/5 x all 4 extremities.  Psychiatric: Normal judgment and insight. Alert and oriented x 3. Normal mood.    Assessment/Plan: Acute problems: Septic shock 2/2 MRSA bacteremia and associated TV endocarditis status post angio VAC/abnormal lumbar imaging -12/6 is day 19/42 days of IV daptomycin via PICC inserted 11/30 (last dose should be 01/16/20) -Appreciate assistance of infectious disease team-CK stable while on IV daptomycin -Continue lidocaine patch for LBP  -Repeat imaging on 11/23 revealed no evidence of infection in lumbar spine.  With follow-up imaging on 12/9 unremarkable except for soft tissue edema.  Acute hypoxemic respiratory failure (multifactorial) 1 acute sepsis 2 septic emboli 3 suspected evolving pneumonia/bilateral pleural effusions -Sepsis physiology resolved -Oral doxycycline for sequela from septic pulmonary emboli -Slowly weaning O2; down to 2 L/min -Continue supportive care for pleuritic chest discomfort -We will need to check room air ambulatory sats prior to discharge to determine if home O2 indicated -Encourage use of incentive spirometry  Persistent tachycardia/tricuspid valve insufficiency -Evaluated by CVTS and currently not a surgical candidate for cardiac valve replacement giving active IVDA prior to current admission (although was appropriate for angiovac surgery) -Echocardiogram: mild systolic LV dysfunction -cardiology felt this was  physiologic  response to hypoalbuminemia/malnutrition, deconditioning and anemia. -Cardiology recommended that may not suppress physiologic tachycardia with beta-blockers nor utilize diuretics prophylactically due to increased risk of harm   LBP -Musculoskeletal in etiology noting no significant abnormalities on recent MRI -Continue ibuprofen prn  Severe protein calorie malnutrition with associated refeeding syndrome -Initial BMI 17 Amber [name] briefly required tube feedings which have now been discontinued. -BMI continues to trend upward on regular diet with supplementation as below Ensure feeding supplements Nutrition Problem: Inadequate oral intake Etiology: decreased appetite Signs/Symptoms: per Amber [name]/family report Interventions: Ensure Enlive (each supplement provides 350kcal and 20 grams of protein),Tube feeding Estimated body mass index is 17.82 kg/m as calculated from the following:   Height as of this encounter: 5\' 2"  (1.575 m).   Weight as of this encounter: 44.2 kg.  Skin wound secondary to IV infiltration/right groin wound/right neck incision   11/30/19                        12/20/19  -ID also following along regarding this wound.  No indication to change current management and no indication to consult surgery -Dark eschar fell off on 12/10 -Continue gabapentin 300 mg TID  Incision (Closed) 12/05/19 Neck Right (Active)  Date First  Assessed/Time First Assessed: 12/05/19 1017   Location: Neck  Location Orientation: Right    Assessments 12/05/2019 12:00 PM 12/25/2019  9:19 AM  Dressing Type Gauze (Comment) None  Dressing Clean;Dry;Intact Clean;Intact  Dressing Change Frequency PRN --  Site / Wound Assessment Clean;Dry Clean;Dry  Margins -- Attached edges (approximated)  Drainage Amount None --     No Linked orders to display     Incision (Closed) 12/05/19 Groin Right (Active)  Date First Assessed/Time First Assessed: 12/05/19 1017   Location: Groin  Location Orientation: Right     Assessments 12/05/2019 12:00 PM 12/25/2019  9:19 AM  Dressing Type Gauze (Comment) None  Dressing Dry;Intact;Clean Clean;Dry;Intact  Dressing Change Frequency PRN --  Site / Wound Assessment Clean;Dry Clean;Dry  Margins -- Attached edges (approximated)  Closure -- Skin glue  Drainage Amount None --     No Linked orders to display     Wound / Incision (Open or Dehisced) 12/10/19 Non-pressure wound Arm Left;Posterior;Lateral infiltration/phlebitis (Active)  Date First Assessed/Time First Assessed: 12/10/19 1915   Wound Type: Non-pressure wound  Location: Arm  Location Orientation: Left;Posterior;Lateral  Wound Description (Comments): infiltration/phlebitis    Assessments 12/10/2019 11:45 PM 12/26/2019  6:00 AM  Dressing Type Thin film;Non adherent;Gauze (Comment) Petroleum;Gauze (Comment)  Dressing Changed New Changed  Dressing Status Intact Clean;Dry;Intact  Dressing Change Frequency PRN Daily  Site / Wound Assessment Painful;Red;Bleeding Pink;Red  Peri-wound Assessment -- Pink  Margins -- Unattached edges (unapproximated)  Closure None --  Drainage Amount Minimal Minimal  Drainage Description Serosanguineous Serosanguineous  Treatment Other (Comment) --     No Linked orders to display    Chronic IV drug abuse with opiates/recent withdrawal syndrome -12/13: Discussed with Amber [name] that plan is to decrease to discharge dose of methadone 40 mg daily by next Monday 12/20 -Pam has been decreased to 0.25 mg BID and dose will be decreased to once daily today  GAD -As noted above weaning and decreasing benzodiazepine -Discussed with Amber [name]'s mother and as I have also noted Amber [name] has significant anxiety issues -12/10: Lexapro 10 mg HS initiated and plan to continue after discharge      Other problems: Anemia of chronic disease/recent sepsis related thrombocytopenia -Thrombocytopenia has resolved -Hemoglobin stable around 8 -With initiation of NSAIDs daily Protonix has been  started  Hyponatremia -Current sodium stable at 133  Hep C antibody positive with persistent transaminitis -Currently has nonobstructive transaminitis that has improved with improvement in nutrition -As of 12/3 albumin 2.0, AST 52, ALT normal at 42 and total bilirubin has been normal -HCVRNA quantitative pending   Data Reviewed: Basic Metabolic Panel: Recent Labs  Lab 12/24/19 0325  NA 136  K 3.8  CL 100  CO2 28  GLUCOSE 76  BUN 6  CREATININE 0.35*  CALCIUM 8.5*   Liver Function Tests: No results for input(s): AST, ALT, ALKPHOS, BILITOT, PROT, ALBUMIN in the last 168 hours. No results for input(s): LIPASE, AMYLASE in the last 168 hours. No results for input(s): AMMONIA in the last 168 hours. CBC: Recent Labs  Lab 12/20/19 0416 12/24/19 0325  WBC 6.7 6.6  HGB 8.2* 7.6*  HCT 28.2* 25.8*  MCV 95.9 91.8  PLT 187 257   Cardiac Enzymes: Recent Labs  Lab 12/22/19 0535  CKTOTAL 14*   BNP (last 3 results) Recent Labs    11/30/19 1809  BNP 583.0*    ProBNP (last 3 results) No results for input(s): PROBNP in the last 8760 hours.  CBG: Recent Labs  Lab 12/25/19  1629 12/25/19 2016 12/26/19 0022 12/26/19 0359 12/26/19 0754  GLUCAP 130* 101* 69* 87 68*    No results found for this or any previous visit (from the past 240 hour(s)).   Studies: No results found.  Scheduled Meds: . chlorhexidine  15 mL Mouth Rinse BID  . Chlorhexidine Gluconate Cloth  6 each Topical Daily  . clonazepam  0.25 mg Oral BID  . diclofenac Sodium  4 g Topical QID  . doxycycline  100 mg Oral Q12H  . escitalopram  10 mg Oral QHS  . feeding supplement  237 mL Oral TID BM  . gabapentin  300 mg Oral TID  . lidocaine  2 patch Transdermal Q24H  . mouth rinse  15 mL Mouth Rinse q12n4p  . methadone  20 mg Oral Q8H  . pantoprazole  40 mg Oral Daily  . sodium chloride flush  10-40 mL Intracatheter Q12H  . sodium chloride flush  10-40 mL Intracatheter Q12H   Continuous Infusions: .  sodium chloride 10 mL/hr at 12/05/19 0617  . sodium chloride Stopped (12/19/19 2156)  . DAPTOmycin (CUBICIN)  IV 500 mg (12/25/19 1934)    Principal Problem:   MRSA infection Active Problems:   Septic shock (HCC)   Opioid use with withdrawal (HCC)   Endocarditis of tricuspid valve   Palliative care encounter   Consultants:  PCCM  Infectious disease  CVTS  Cardiology  Palliative medicine  Procedures:  10/22 echocardiogram  10/24 TEE  11/17 complete echocardiogram  11/22 intraoperative TEE/application of angio [facility]  11/24 core track  11/29 Limited echocardiogram  Antibiotics: Anti-infectives (From admission, onward)   Start     Dose/Rate Route Frequency Ordered Stop   12/13/19 1115  doxycycline (VIBRA-TABS) tablet 100 mg        100 mg Oral Every 12 hours 12/13/19 1027 01/16/20 2359   12/07/19 2000  DAPTOmycin (CUBICIN) 500 mg in sodium chloride 0.9 % IVPB        500 mg 220 mL/hr over 30 Minutes Intravenous Daily 12/06/19 1345 01/16/20 2359   12/02/19 1400  vancomycin (VANCOREADY) IVPB 500 mg/100 mL  Status:  Discontinued        500 mg 100 mL/hr over 60 Minutes Intravenous Every 8 hours 12/02/19 1127 12/02/19 1131   12/02/19 1400  vancomycin (VANCOREADY) IVPB 750 mg/150 mL  Status:  Discontinued        750 mg 150 mL/hr over 60 Minutes Intravenous Every 8 hours 12/02/19 1131 12/06/19 1339   12/01/19 1200  vancomycin (VANCOREADY) IVPB 500 mg/100 mL  Status:  Discontinued        500 mg 100 mL/hr over 60 Minutes Intravenous Every 24 hours 11/30/19 1454 12/01/19 0946   12/01/19 1200  vancomycin (VANCOCIN) IVPB 1000 mg/200 mL premix  Status:  Discontinued        1,000 mg 200 mL/hr over 60 Minutes Intravenous Every 24 hours 12/01/19 0946 12/02/19 1127   11/30/19 1800  piperacillin-tazobactam (ZOSYN) IVPB 3.375 g  Status:  Discontinued        3.375 g 12.5 mL/hr over 240 Minutes Intravenous Every 8 hours 11/30/19 1454 12/02/19 0234   11/30/19 1115   piperacillin-tazobactam (ZOSYN) IVPB 3.375 g        3.375 g 100 mL/hr over 30 Minutes Intravenous  Once 11/30/19 1110 11/30/19 1306   11/30/19 1115  vancomycin (VANCOCIN) IVPB 1000 mg/200 mL premix        1,000 mg 200 mL/hr over 60 Minutes Intravenous  Once 11/30/19 1110 11/30/19  1337       Time spent: 20 minutes    [name] ANP  [facility] Pager [phone].

## 2019-12-26 NOTE — Progress Notes (Signed)
Physical Therapy Treatment Patient Details Name: Amber [name] MRN: [id number] DOB: 1990-12-02 Today's Date: 12/26/2019    History of Present Illness 29 yo admitted 11/17 with septic shock due to MRSA endocarditis with IVDU fentanyl and heroin day of admission. PMhx: admission 10/23-10/26 for endocarditis left AMA, IVDU, cellulitis, depression    PT Comments    Pt reports 8/10 L hip pain, which reportedly happened after walking yesterday. Pt reports she was going to use today as a rest day. Pt is self limiting. Explained to pt that she is extremely weak and needs to work on Print production planner. Pt refuses to walk with therapy however is willing to perform therex. Pt able to perform 10x sit<>stand and LE strengthening in chair.  D/c plans remain appropriate. PT will continue to follow acutely.    Follow Up Recommendations  Home health PT;Supervision/Assistance - 24 hour     Equipment Recommendations  Rolling walker with 5" wheels;3in1 (PT)    Recommendations for Other Services OT consult     Precautions / Restrictions Precautions Precautions: Fall Restrictions Weight Bearing Restrictions: No    Mobility  Bed Mobility Overal bed mobility: Needs Assistance Bed Mobility: Supine to Sit     Supine to sit: Supervision;HOB elevated Sit to supine: Supervision   General bed mobility comments: up in chair on entry  Transfers Overall transfer level: Needs assistance Equipment used: Rolling walker (2 wheeled) Transfers: Sit to/from Stand Sit to Stand: Supervision Stand pivot transfers: Min guard       General transfer comment: 10x sit<>stand, vc for eccentric lowering with LE not UE  Ambulation/Gait Ambulation/Gait assistance: Min guard   Assistive device: Rolling walker (2 wheeled) Gait Pattern/deviations: Step-through pattern;Decreased stride length;Shuffle;Trunk flexed Gait velocity: slowed   General Gait Details: refused due to L hip pain         Balance Overall  balance assessment: Needs assistance Sitting-balance support: No upper extremity supported;Feet supported Sitting balance-[name]: Good     Standing balance support: No upper extremity supported;During functional activity Standing balance-[name]: Fair Standing balance comment: able to stand at sink and brush teeth without UE support                            Cognition Arousal/Alertness: Awake/alert Behavior During Therapy: WFL for tasks assessed/performed Overall Cognitive Status: Within Functional Limits for tasks assessed                               Problem Solving: Slow processing;Requires verbal cues General Comments: self-limiting due to pain      Exercises General Exercises - Lower Extremity Ankle Circles/Pumps: AROM;Both;10 reps;Seated Gluteal Sets: AROM;Both;10 reps;Seated Long Arc Quad: AROM;Both;10 reps;Seated Hip ABduction/ADduction: AROM;Both;Seated    General Comments General comments (skin integrity, edema, etc.): pt on 2L O2 via [place] SaO2 >90%O2, HR in 130s with sit<>stand returned to 110s with seated exercise      Pertinent Vitals/Pain Pain Assessment: 0-10 Pain Score: 8  Pain Location: L hip Pain Descriptors / Indicators: Aching;Guarding;Stabbing Pain Intervention(s): Limited activity within patient's tolerance;Monitored during session;Heat applied     PT Goals (current goals can now be found in the care plan section) Acute Rehab PT Goals Patient Stated Goal: Stop coughing PT Goal Formulation: With patient Time For Goal Achievement: 12/23/19 Potential to Achieve Goals: Fair Progress towards PT goals: Not progressing toward goals - comment (unable to progress mobility due to L  hip pain)    Frequency    Min 3X/week      PT Plan Current plan remains appropriate    Co-evaluation              AM-PAC PT "6 Clicks" Mobility   Outcome Measure  Help needed turning from your back to your side while in a flat  bed without using bedrails?: None Help needed moving from lying on your back to sitting on the side of a flat bed without using bedrails?: A Little Help needed moving to and from a bed to a chair (including a wheelchair)?: A Little Help needed standing up from a chair using your arms (e.g., wheelchair or bedside chair)?: A Little Help needed to walk in hospital room?: A Little Help needed climbing 3-5 steps with a railing? : A Little 6 Click Score: 19    End of Session Equipment Utilized During Treatment: Oxygen Activity Tolerance: Patient limited by pain;Other (comment) (self limiting due to pain) Patient left: in bed;with call bell/phone within reach Nurse Communication: Mobility status PT Visit Diagnosis: Other abnormalities of gait and mobility (R26.89);Muscle weakness (generalized) (M62.81);Pain Pain - Right/Left: Right Pain - part of body:  (ribs, back)     Time: 3536-1443 PT Time Calculation (min) (ACUTE ONLY): 20 min  Charges:  $Therapeutic Exercise: 8-22 mins                     Amber [name] B. Beverely Risen PT, DPT Acute Rehabilitation Services Pager [phone] Office [phone]    Amber [name] 12/26/2019, 2:51 PM

## 2019-12-26 NOTE — Plan of Care (Signed)
  Problem: Education: Goal: Knowledge of General Education information will improve Description: Including pain rating scale, medication(s)/side effects and non-pharmacologic comfort measures Outcome: Progressing   Problem: Clinical Measurements: Goal: Ability to maintain clinical measurements within normal limits will improve Outcome: Progressing Goal: Will remain free from infection Outcome: Progressing Goal: Diagnostic test results will improve Outcome: Progressing Goal: Respiratory complications will improve Outcome: Progressing Goal: Cardiovascular complication will be avoided Outcome: Progressing   Problem: Activity: Goal: Risk for activity intolerance will decrease Outcome: Progressing   Problem: Nutrition: Goal: Adequate nutrition will be maintained Outcome: Progressing   Problem: Pain Managment: Goal: General experience of comfort will improve Outcome: Progressing   Problem: Safety: Goal: Ability to remain free from injury will improve Outcome: Progressing   Problem: Skin Integrity: Goal: Risk for impaired skin integrity will decrease Outcome: Progressing   

## 2019-12-26 NOTE — Progress Notes (Signed)
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 PMhx: admission 10/23-10/26 for endocarditis left AMA, IVDU, cellulitis, depression   OT comments  Pt making steady progress towards OT goals this session. Pt continues to present with increased pain ( L hip), and decreased activity tolerance impacting pts ability to complete BADLs independently. Pt agreeable to limited session secondary to L hip pain. Pt able to transfer from EOB>recliner with min guard and RW. Pt completed seated UB ADLs with set- up assist. Pt on 2L [place] with SpO2 WFL, HR tachy during session up to 130s with minmal activity, BP from supine 101/69 pre mobility. Agree with DC plan below, will follow acutely per POC.    Follow Up Recommendations  No OT follow up    Equipment Recommendations  None recommended by OT    Recommendations for Other Services      Precautions / Restrictions Precautions Precautions: Fall Restrictions Weight Bearing Restrictions: No       Mobility Bed Mobility Overal bed mobility: Needs Assistance Bed Mobility: Supine to Sit     Supine to sit: Supervision;HOB elevated     General bed mobility comments: supervision mostly for line mgmt  Transfers Overall transfer level: Needs assistance Equipment used: Rolling walker (2 wheeled) Transfers: Sit to/from [employer] Sit to Stand: Supervision Stand pivot transfers: Min guard       General transfer comment: supervision- minguard mostly for line mgmt and safety    Balance Overall balance assessment: Needs assistance Sitting-balance support: No upper extremity supported;Feet supported Sitting balance-[name]: Good     Standing balance support: Bilateral upper extremity supported;During functional  activity Standing balance-[name]: Poor Standing balance comment: reliant on BUE support during mobility                           ADL either performed or assessed with clinical judgement   ADL Overall ADL's : Needs assistance/impaired     Grooming: Wash/dry face;Wash/dry hands;Oral care;Sitting;Set up Grooming Details (indicate cue type and reason): from recliner; deferred standing grooming tasks d/t pain in L hip                 Toilet Transfer: Min Camera operator Details (indicate cue type and reason): min guard for simulated toilet transder via stand pivot from EOB>recliner with RW         Functional mobility during ADLs: Rolling walker;Min guard General ADL Comments: pt able to perform seated ADLs from recliner, session limited by L hip this session     Vision       Perception     Praxis      Cognition Arousal/Alertness: Awake/alert Behavior During Therapy: WFL for tasks assessed/performed Overall Cognitive Status: No family/caregiver present to determine baseline cognitive functioning                                 General Comments: overall WFL, perseverating on pain but pleasant and agreeable to limited session        Exercises     Shoulder Instructions       General Comments pt on 2L [place] with SpO2 WFL, HR tachy during session up to 130s with minmal activity, BP  from supine 101/69 pre mobility    Pertinent Vitals/ Pain       Pain Assessment: 0-10 Pain Score: 8  Pain Location: L hip Pain Descriptors / Indicators: Aching;Guarding;Stabbing;Discomfort Pain Intervention(s): Limited activity within patient's tolerance;Monitored during session;Repositioned  Home Living                                          Prior Functioning/Environment              Frequency  Min 2X/week        Progress Toward Goals  OT Goals(current goals can now be found in the care plan  section)  Progress towards OT goals: Progressing toward goals  Acute Rehab OT Goals Patient Stated Goal: Stop coughing OT Goal Formulation: With patient Time For Goal Achievement: 01/03/20 Potential to Achieve Goals: Good  Plan Discharge plan remains appropriate;Frequency remains appropriate    Co-evaluation                 AM-PAC OT "6 Clicks" Daily Activity     Outcome Measure   Help from another person eating meals?: None Help from another person taking care of personal grooming?: A Little Help from another person toileting, which includes using toliet, bedpan, or urinal?: A Little Help from another person bathing (including washing, rinsing, drying)?: A Little Help from another person to put on and taking off regular upper body clothing?: None Help from another person to put on and taking off regular lower body clothing?: A Little 6 Click Score: 20    End of Session Equipment Utilized During Treatment: Rolling walker;Oxygen;Other (comment) (2L [place])  OT Visit Diagnosis: Unsteadiness on feet (R26.81);Other abnormalities of gait and mobility (R26.89);Muscle weakness (generalized) (M62.81)   Activity Tolerance Patient tolerated treatment well   Patient Left in chair;with call bell/phone within reach;with chair alarm set   Nurse Communication Mobility status        Time: 2979-8921 OT Time Calculation (min): 27 min  Charges: OT General Charges $OT Visit: 1 Visit OT Treatments $Self Care/Home Management : 23-37 mins  Audery Amel., COTA/L Acute Rehabilitation Services [phone]    Angelina Pih 12/26/2019, 2:09 PM

## 2019-12-27 DIAGNOSIS — E43 Unspecified severe protein-calorie malnutrition: Secondary | ICD-10-CM | POA: Diagnosis present

## 2019-12-27 LAB — GLUCOSE, CAPILLARY
Glucose-Capillary: 63 mg/dL — ABNORMAL LOW (ref 70–99)
Glucose-Capillary: 104 mg/dL — ABNORMAL HIGH (ref 70–99)
Glucose-Capillary: 68 mg/dL — ABNORMAL LOW (ref 70–99)
Glucose-Capillary: 120 mg/dL — ABNORMAL HIGH (ref 70–99)
Glucose-Capillary: 99 mg/dL (ref 70–99)
Glucose-Capillary: 78 mg/dL (ref 70–99)

## 2019-12-27 MED ORDER — NEPRO/CARBSTEADY PO LIQD: 237.0000 mL | Freq: Three times a day (TID) | ORAL | Status: DC | PRN

## 2019-12-27 MED ORDER — ADULT MULTIVITAMIN W/MINERALS CH
1.0000 | ORAL_TABLET | Freq: Every day | ORAL | Status: DC
  Administered 2019-12-27 – 2020-01-16 (×20): 1 via ORAL
  Filled 2019-12-27 (×20): qty 1

## 2019-12-27 NOTE — Progress Notes (Signed)
Nutrition Follow-up  DOCUMENTATION CODES:   Severe malnutrition in context of acute illness/injury,Underweight  INTERVENTION:   - Continue Ensure Enlive/Ensure Plus po TID, each supplement provides 350 kcal and 20 grams of protein (can substitute with vanilla Nepro if vanilla Ensure is not available)  - Magic Cup TID with meals, each supplement provides 290 kcal and 9 grams of protein  - MVI with minerals daily  NUTRITION DIAGNOSIS:   Severe Malnutrition related to acute illness (MRSA endocarditis) as evidenced by energy intake < or equal to 50% for > or equal to 5 days,percent weight loss (8.5% weight loss in less than 2 months).  New diagnosis  GOAL:   Patient will meet greater than or equal to 90% of their needs  Progressing  MONITOR:   PO intake,Supplement acceptance,Labs,Weight trends,Skin  REASON FOR ASSESSMENT:   Consult Enteral/tube feeding initiation and management (nocturnal TF)  ASSESSMENT:   29 yo female admitted with septic shock, AKI. PMH includes tricuspid MRSA endocarditis, IVDU.  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  Spoke with pt at bedside. Pt lethargic at time of RD visit. Noted ~25% complete breakfast meal tray at bedside. RD assisted pt in getting set up for lunch. Pt requesting vanilla Ensure; however, this flavor was not available. Provided pt with a vanilla Nepro which she stated was "fine."  Discussed with pt the importance of PO intake in preventing additional weight loss and promoting wound healing. Pt states that she is "sorry" and she is trying the best that she can.  Discussed pt with RN. RN confirms pt is not eating much. Discussed possibility of substituting vanilla Nepro in place of vanilla Ensure if it is not available.  Pt reports that she typically weighs 105 lbs. Pt with  a weight loss of 4.2 kg since 11/07/19. This is an 8.5% weight loss in less than 2 months which is significant for timeframe. Pt meets criteria for severe acute malnutrition.  Admit weight: 51.1 kg Current weight: 44.1 kg  Meal Completion: 45-75% x last 8 documented meals  Medications reviewed and include: Ensure Enlive TID, methadone, protonix, IV abx  Labs reviewed: hemoglobin 7.6 CBG's: 61-120 x 24 hours  Diet Order:   Diet Order            Diet regular Room service appropriate? Yes; Fluid consistency: Thin  Diet effective now                 EDUCATION NEEDS:   Education needs have been addressed  Skin:  Skin Assessment: Skin Integrity Issues: Incisions: right neck, right groin Other: infiltration/phlebitis to left arm  Last BM:  12/26/19  Height:   Ht Readings from Last 1 Encounters:  12/05/19 5\' 2"  (1.575 m)    Weight:   Wt Readings from Last 1 Encounters:  12/27/19 44.1 kg    Ideal Body Weight:  50 kg  BMI:  Body mass index is 17.78 kg/m.  Estimated Nutritional Needs:   Kcal:  1800-2000  Protein:  80-95 gm  Fluid:  >/= 1.5 L    12/29/19, MS, RD, LDN Inpatient Clinical Dietitian Please see [facility] for contact information.

## 2019-12-27 NOTE — Progress Notes (Signed)
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 PMhx: admission 10/23-10/26 for endocarditis left AMA, IVDU, cellulitis, depression    PT Comments    Focus of session, education on physical activity and normal discomfort associated with increased cardiopulmonary demand. Pt is on 2L O2 via [place] at rest with SaO2 100%O2. Removed [place] and SaO2 96%O2. Pt able to ambulate on RA with SaO2 >94%O2 with return to supine on RA SaO2 93%O2. Encouraged pt not to replace supplemental O2. Pt is min guard for all mobility and was able to ambulate with no increase in L hip pain. Pt reports understanding of current deconditioning and need to work through discomfort to improve her strength and endurance. D/c plans remain appropriate. PT will continue to follow acutely.   Follow Up Recommendations  Home health PT;Supervision/Assistance - 24 hour     Equipment Recommendations  Rolling walker with 5" wheels;3in1 (PT)    Recommendations for Other Services OT consult     Precautions / Restrictions Precautions Precautions: Fall    Mobility  Bed Mobility Overal bed mobility: Needs Assistance Bed Mobility: Supine to Sit;Sit to Supine     Supine to sit: Supervision;HOB elevated Sit to supine: Supervision;HOB elevated   General bed mobility comments: supervision for safety and line mangement  Transfers Overall transfer level: Needs assistance Equipment used: None Transfers: Sit to/from Stand Sit to Stand: Supervision         General transfer comment: supervision for power up and steadying, able to static stand at EoB while RW placed in front of her  Ambulation/Gait Ambulation/Gait assistance: Min guard Gait Distance (Feet): 180 Feet Assistive device: Rolling walker (2 wheeled) Gait  Pattern/deviations: Step-through pattern;Decreased step length - right;Decreased step length - left;Decreased weight shift to left Gait velocity: slowed Gait velocity interpretation: <1.31 ft/sec, indicative of household ambulator General Gait Details: min guard for safety with slowed gait, decreased weight shift to L LE, does not report any increase in L hip pain with movement          Balance Overall balance assessment: Needs assistance Sitting-balance support: No upper extremity supported;Feet supported Sitting balance-[name]: Good     Standing balance support: No upper extremity supported;During functional activity Standing balance-[name]: Fair Standing balance comment: able to static stand EoB                            Cognition Arousal/Alertness: Awake/alert Behavior During Therapy: WFL for tasks assessed/performed Overall Cognitive Status: Within Functional Limits for tasks assessed                               Problem Solving: Slow processing;Requires verbal cues General Comments: self-limiting due to avoidance of discomfort, ie pain, increased respiratory rate, increased HR         General Comments General comments (skin integrity, edema, etc.): pt on 2L O2 via [place] on entry with SaO2 100%O2, pt reports she needs it otherwise it is hard to breathe when she walkes. Educated on need to wean O2 so that she does not become dependant and that given her level of deconditioning she will be out of breath with walking. Pt is able to ambulate on RA while maintaining SaO2 >  90%O2. Pt agreeable to try to keep supplemental O2 off unless her saturation levels drop.      Pertinent Vitals/Pain Pain Assessment: Faces Faces Pain Scale: Hurts a little bit Pain Location: L hip Pain Descriptors / Indicators: Aching;Guarding;Stabbing Pain Intervention(s): Limited activity within patient's tolerance;Monitored during session;Repositioned;Heat applied            PT Goals (current goals can now be found in the care plan section) Acute Rehab PT Goals Patient Stated Goal: Stop coughing PT Goal Formulation: With patient Time For Goal Achievement: 12/23/19 Potential to Achieve Goals: Fair Progress towards PT goals: Progressing toward goals    Frequency    Min 3X/week      PT Plan Current plan remains appropriate       AM-PAC PT "6 Clicks" Mobility   Outcome Measure  Help needed turning from your back to your side while in a flat bed without using bedrails?: None Help needed moving from lying on your back to sitting on the side of a flat bed without using bedrails?: A Little Help needed moving to and from a bed to a chair (including a wheelchair)?: A Little Help needed standing up from a chair using your arms (e.g., wheelchair or bedside chair)?: A Little Help needed to walk in hospital room?: A Little Help needed climbing 3-5 steps with a railing? : A Little 6 Click Score: 19    End of Session   Activity Tolerance: Other (comment);Patient tolerated treatment well (self limiting due to discomfort with increased respiratory demand of exercise) Patient left: in bed;with call bell/phone within reach Nurse Communication: Mobility status PT Visit Diagnosis: Other abnormalities of gait and mobility (R26.89);Muscle weakness (generalized) (M62.81);Pain Pain - Right/Left: Right Pain - part of body:  (ribs, back)     Time: 1017-5102 PT Time Calculation (min) (ACUTE ONLY): 18 min  Charges:  $Gait Training: 8-22 mins                     Alecia Doi B. Beverely Risen PT, DPT Acute Rehabilitation Services Pager [phone] Office [phone]    Elon Alas Fleet 12/27/2019, 10:58 AM

## 2019-12-27 NOTE — Plan of Care (Signed)
  Problem: Education: Goal: Knowledge of General Education information will improve Description: Including pain rating scale, medication(s)/side effects and non-pharmacologic comfort measures Outcome: Progressing   Problem: Health Behavior/Discharge Planning: Goal: Ability to manage health-related needs will improve Outcome: Progressing   Problem: Clinical Measurements: Goal: Ability to maintain clinical measurements within normal limits will improve Outcome: Progressing Goal: Diagnostic test results will improve Outcome: Progressing Goal: Respiratory complications will improve Outcome: Progressing Goal: Cardiovascular complication will be avoided Outcome: Progressing   Problem: Activity: Goal: Risk for activity intolerance will decrease Outcome: Progressing   Problem: Coping: Goal: Level of anxiety will decrease Outcome: Progressing   Problem: Elimination: Goal: Will not experience complications related to bowel motility Outcome: Progressing Goal: Will not experience complications related to urinary retention Outcome: Progressing   Problem: Pain Managment: Goal: General experience of comfort will improve Outcome: Progressing

## 2019-12-27 NOTE — Progress Notes (Addendum)
[facility] PROGRESS NOTE  Amber [name] PZW:[id number] DOB: [birth year] DOA: 11/30/2019 PCP: Patient, No Pcp Per  Status: Remains inpatient appropriate because:Ongoing active pain requiring inpatient pain management, Ongoing diagnostic testing needed not appropriate for outpatient work up, Unsafe d/c plan, IV treatments appropriate due to intensity of illness or inability to take PO and Inpatient level of care appropriate due to severity of illness   Dispo: The patient is from: Home              Anticipated d/c is to: Home              Anticipated d/c date is: > 3 days (01/16/20)              Patient currently is not medically stable to d/c.  Patient's primary reason for remaining in the hospital is to complete 6 weeks of IV and oral antibiotics for her endocarditis with septic emboli .  She is currently requiring O2 2/2 sequelae from septic emboli.  Code Status: Full Family Communication: Patient; mother at bedside on 12/14  DVT prophylaxis: SCDs 2/2 recent thrombocytopenia due to sepsis Vaccination status: Has not been vaccinated against Covid  Foley catheter: No  HPI: 29 year old female patient with known IV drug abuse with known tricuspid MRSA endocarditis who signed out AMA x2 prior to this admission.  She presented with progressive infection.  On 10/21 through 10/23 she was admitted with fever with blood cultures positive for MRSA.  CT of the chest demonstrated septic emboli and edematous L4-5 facet joint (? Evolving osteomyelitis) as well as tricuspid vegetation on CT.  She left AMA.  She returned 2 hours later on 10/23 with severe back pain.  Underwent TEE that demonstrated a 1 cm tricuspid valve mass.  She was evaluated by CT surgery who recommended medical management.  Again she left AMA on 10/25.  Patient was staying with friends but presented back to the hospital due to increasing shortness of breath, weakness, fatigue malaise and nausea.  She arrived in the parking lot  obtunded and was found to have multiple needles in her car on 11/17.  In the ER she was hypotensive and started on pressors.  Hemoglobin was low and she was given a blood transfusion.  She was also started on empiric IV vancomycin for known MRSA infection  and was admitted to the critical care service.  She eventually was taken to the OR for angio VAC during this admission.  She has subsequently transitioned out of ICU to stepdown unit.  She has had an IV infiltration and has a superficial wound on her left forearm. ID continues to follow regarding the endocarditis and recommends a total of 6 weeks therapy be daptomycin.  She has continued to have hypoxemia and productive cough and on 11/30 ID added doxycycline.  She is reporting pleuritic chest pain as well.  She is also very malnourished and was started on tube feedings and developed mild electrolyte disturbances from refeeding syndrome which have resolved.  She has low-grade resting tachycardia with no significant abnormalities on echo and cardiology previously did not suspect cardiac etiology to her tachycardia.  Hepatitis C antibody has been positive; she is HIV negative.  She has had persistent elevation in her LFTs.  She is currently on methadone 20 mg 3 times a day but will need to be on no more than 40 mg per 24 hours at time of discharge to be eligible for outpatient methadone treatment.  Subjective: Alert.  No specific  complaints verbalized.  Mother at bedside.  Objective: Vitals:   12/27/19 0902 12/27/19 0945  BP:  92/64  Pulse:  (!) 103  Resp:  (!) 22  Temp: 98.4 F (36.9 C) 98.4 F (36.9 C)  SpO2:  100%    Intake/Output Summary (Last 24 hours) at 12/27/2019 1021 Last data filed at 12/27/2019 0325 Gross per 24 hour  Intake 927 ml  Output --  Net 927 ml   Filed Weights   12/25/19 0041 12/26/19 0317 12/27/19 0325  Weight: 45.3 kg 44.2 kg 44.1 kg    Exam: General: Awake, pleasant no acute distress Pulmonary: Lung sounds clear  to auscultation no increased work of breathing, liters oxygen Cardiac: S1 S2 without any murmurs, is regular with subtle resting tachycardia, no peripheral edema Abdomen: Soft nontender nondistended.  Tolerating diet LBM 12/13 Extremities: Symmetrical, left upper extremity wound covered by dressing   Assessment/Plan: Acute problems: Septic shock 2/2 MRSA bacteremia and associated TV endocarditis status post angio VAC/abnormal lumbar imaging -12/6 is day 19/42 days of IV daptomycin via PICC inserted 11/30 (last dose should be 01/16/20)-CK stable -Appreciate assistance of infectious disease team -Repeat imaging on 11/23 revealed no evidence of infection in lumbar spine.  With follow-up imaging on 12/9 unremarkable except for soft tissue edema.  Acute hypoxemic respiratory failure (multifactorial) 1 acute sepsis 2 septic emboli with sequela of small pleural effusion -Sepsis physiology resolved -Oral doxycycline for sequela from septic pulmonary emboli-will DC at discharge -Slowly weaning O2; down to 2 L/min -Continue supportive care for pleuritic chest discomfort -12/14 per PT note: "Pt is on 2L O2 via [place] at rest with SaO2 100%O2. Removed [place] and SaO2 96%O2. Pt able to ambulate on RA with SaO2 >94%O2 with return to supine on RA SaO2 93%O2." -Encourage use of incentive spirometry  Persistent tachycardia/tricuspid valve insufficiency -Evaluated by CVTS and currently not a surgical candidate for cardiac valve replacement giving active IVDA prior to current admission (although was appropriate for angiovac surgery) -Echocardiogram: mild systolic LV dysfunction -cardiology felt this was  physiologic response to hypoalbuminemia/malnutrition, deconditioning and anemia. -Cardiology recommended that may not suppress physiologic tachycardia with beta-blockers nor utilize diuretics prophylactically due to increased risk of harm   LBP -Musculoskeletal in etiology noting no significant abnormalities on  recent MRI -Continue ibuprofen prn and lidocaine patch  Severe protein calorie malnutrition with associated refeeding syndrome -Initial BMI 17 patient briefly required tube feedings which have now been discontinued. -BMI continues to trend upward on regular diet with supplementation as below Ensure feeding supplements Nutrition Problem: Inadequate oral intake Etiology: decreased appetite Signs/Symptoms: per patient/family report Interventions: Ensure Enlive (each supplement provides 350kcal and 20 grams of protein),Tube feeding Estimated body mass index is 17.78 kg/m as calculated from the following:   Height as of this encounter: 5\' 2"  (1.575 m).   Weight as of this encounter: 44.1 kg.  Skin wound secondary to IV infiltration/right groin wound/right neck incision   11/30/19                        12/20/19  -ID also following along regarding this wound.  No indication to change current management and no indication to consult surgery -Dark eschar fell off on 12/10 -Continue gabapentin 300 mg TID  Incision (Closed) 12/05/19 Neck Right (Active)  Date First Assessed/Time First Assessed: 12/05/19 1017   Location: Neck  Location Orientation: Right    Assessments 12/05/2019 12:00 PM 12/26/2019  8:09 PM  Dressing Type Gauze (Comment)  None  Dressing Clean;Dry;Intact --  Dressing Change Frequency PRN --  Site / Wound Assessment Clean;Dry Clean;Dry  Margins -- Attached edges (approximated)  Closure -- Skin glue  Drainage Amount None None     No Linked orders to display     Incision (Closed) 12/05/19 Groin Right (Active)  Date First Assessed/Time First Assessed: 12/05/19 1017   Location: Groin  Location Orientation: Right    Assessments 12/05/2019 12:00 PM 12/26/2019  8:09 PM  Dressing Type Gauze (Comment) None  Dressing Dry;Intact;Clean --  Dressing Change Frequency PRN --  Site / Wound Assessment Clean;Dry Clean;Dry  Margins -- Attached edges (approximated)  Closure -- Skin glue   Drainage Amount None None     No Linked orders to display     Wound / Incision (Open or Dehisced) 12/10/19 Non-pressure wound Arm Left;Posterior;Lateral infiltration/phlebitis (Active)  Date First Assessed/Time First Assessed: 12/10/19 1915   Wound Type: Non-pressure wound  Location: Arm  Location Orientation: Left;Posterior;Lateral  Wound Description (Comments): infiltration/phlebitis    Assessments 12/10/2019 11:45 PM 12/27/2019  3:25 AM  Dressing Type Thin film;Non adherent;Gauze (Comment) Gauze (Comment);Petroleum  Dressing Changed New Changed  Dressing Status Intact Clean;Dry;Intact  Dressing Change Frequency PRN Daily  Site / Wound Assessment Painful;Red;Bleeding Pink;Red;Yellow;Painful  Peri-wound Assessment -- Pink  Margins -- Unattached edges (unapproximated)  Closure None None  Drainage Amount Minimal Minimal  Drainage Description Serosanguineous Serosanguineous  Treatment Other (Comment) Cleansed;Other (Comment)     No Linked orders to display    Chronic IV drug abuse with opiates/recent withdrawal syndrome -12/13: Discussed with patient that plan is to decrease to discharge dose of methadone 40 mg daily by next Monday 12/20 -Pam has been decreased to 0.25 mg BID and dose will be decreased to once daily today  GAD -As noted above weaning and decreasing benzodiazepine -Discussed with patient's mother and as I have also noted patient has significant anxiety issues -12/10: Lexapro 10 mg HS initiated and plan to continue after discharge      Other problems: Anemia of chronic disease/recent sepsis related thrombocytopenia -Thrombocytopenia has resolved -Hemoglobin stable around 8 -With initiation of NSAIDs daily Protonix has been started  Hyponatremia -Current sodium stable at 133  Hep C antibody positive with persistent transaminitis -Currently has nonobstructive transaminitis that has improved with improvement in nutrition -As of 12/3 albumin 2.0, AST 52, ALT  normal at 42 and total bilirubin has been normal -HCVRNA quantitative pending   Data Reviewed: Basic Metabolic Panel: Recent Labs  Lab 12/24/19 0325  NA 136  K 3.8  CL 100  CO2 28  GLUCOSE 76  BUN 6  CREATININE 0.35*  CALCIUM 8.5*   Liver Function Tests: No results for input(s): AST, ALT, ALKPHOS, BILITOT, PROT, ALBUMIN in the last 168 hours. No results for input(s): LIPASE, AMYLASE in the last 168 hours. No results for input(s): AMMONIA in the last 168 hours. CBC: Recent Labs  Lab 12/24/19 0325  WBC 6.6  HGB 7.6*  HCT 25.8*  MCV 91.8  PLT 257   Cardiac Enzymes: Recent Labs  Lab 12/22/19 0535  CKTOTAL 14*   BNP (last 3 results) Recent Labs    11/30/19 1809  BNP 583.0*    ProBNP (last 3 results) No results for input(s): PROBNP in the last 8760 hours.  CBG: Recent Labs  Lab 12/27/19 0128 12/27/19 0207 12/27/19 0347 12/27/19 0503 12/27/19 0858  GLUCAP 63* 104* 68* 120* 99    No results found for this or any previous visit (from the  past 240 hour(s)).   Studies: No results found.  Scheduled Meds: . chlorhexidine  15 mL Mouth Rinse BID  . Chlorhexidine Gluconate Cloth  6 each Topical Daily  . clonazepam  0.25 mg Oral Daily  . collagenase   Topical Daily  . diclofenac Sodium  4 g Topical QID  . doxycycline  100 mg Oral Q12H  . escitalopram  10 mg Oral QHS  . feeding supplement  237 mL Oral TID BM  . gabapentin  300 mg Oral TID  . lidocaine  2 patch Transdermal Q24H  . mouth rinse  15 mL Mouth Rinse q12n4p  . methadone  20 mg Oral Q8H  . pantoprazole  40 mg Oral Daily  . sodium chloride flush  10-40 mL Intracatheter Q12H  . sodium chloride flush  10-40 mL Intracatheter Q12H   Continuous Infusions: . sodium chloride 10 mL/hr at 12/05/19 0617  . sodium chloride Stopped (12/19/19 2156)  . DAPTOmycin (CUBICIN)  IV 220 mL/hr at 12/27/19 0010    Principal Problem:   MRSA infection Active Problems:   Septic shock (HCC)   Opioid use with  withdrawal (HCC)   Endocarditis of tricuspid valve   Palliative care encounter   Consultants:  PCCM  Infectious disease  CVTS  Cardiology  Palliative medicine  Procedures:  10/22 echocardiogram  10/24 TEE  11/17 complete echocardiogram  11/22 intraoperative TEE/application of angio [facility]  11/24 core track  11/29 Limited echocardiogram  Antibiotics: Anti-infectives (From admission, onward)   Start     Dose/Rate Route Frequency Ordered Stop   12/13/19 1115  doxycycline (VIBRA-TABS) tablet 100 mg        100 mg Oral Every 12 hours 12/13/19 1027 01/16/20 2359   12/07/19 2000  DAPTOmycin (CUBICIN) 500 mg in sodium chloride 0.9 % IVPB        500 mg 220 mL/hr over 30 Minutes Intravenous Daily 12/06/19 1345 01/16/20 2359   12/02/19 1400  vancomycin (VANCOREADY) IVPB 500 mg/100 mL  Status:  Discontinued        500 mg 100 mL/hr over 60 Minutes Intravenous Every 8 hours 12/02/19 1127 12/02/19 1131   12/02/19 1400  vancomycin (VANCOREADY) IVPB 750 mg/150 mL  Status:  Discontinued        750 mg 150 mL/hr over 60 Minutes Intravenous Every 8 hours 12/02/19 1131 12/06/19 1339   12/01/19 1200  vancomycin (VANCOREADY) IVPB 500 mg/100 mL  Status:  Discontinued        500 mg 100 mL/hr over 60 Minutes Intravenous Every 24 hours 11/30/19 1454 12/01/19 0946   12/01/19 1200  vancomycin (VANCOCIN) IVPB 1000 mg/200 mL premix  Status:  Discontinued        1,000 mg 200 mL/hr over 60 Minutes Intravenous Every 24 hours 12/01/19 0946 12/02/19 1127   11/30/19 1800  piperacillin-tazobactam (ZOSYN) IVPB 3.375 g  Status:  Discontinued        3.375 g 12.5 mL/hr over 240 Minutes Intravenous Every 8 hours 11/30/19 1454 12/02/19 0234   11/30/19 1115  piperacillin-tazobactam (ZOSYN) IVPB 3.375 g        3.375 g 100 mL/hr over 30 Minutes Intravenous  Once 11/30/19 1110 11/30/19 1306   11/30/19 1115  vancomycin (VANCOCIN) IVPB 1000 mg/200 mL premix        1,000 mg 200 mL/hr over 60 Minutes Intravenous   Once 11/30/19 1110 11/30/19 1337       Time spent: 20 minutes    [name] ANP  [facility] Pager [phone].

## 2019-12-27 NOTE — Progress Notes (Signed)
Hypoglycemic Event  CBG: 63  Treatment: 8 oz Juice  Symptoms:Asymptomatic Follow-up CBG: Time:0205 CBG Result: 94  Possible Reasons for Event Patient not eating well   Comments/MD notified: Encouraged patient to eat frequently.     Amber [name]

## 2019-12-28 LAB — BASIC METABOLIC PANEL
Anion gap: 8 (ref 5–15)
BUN: 8 mg/dL (ref 6–20)
CO2: 31 mmol/L (ref 22–32)
Calcium: 8.8 mg/dL — ABNORMAL LOW (ref 8.9–10.3)
Chloride: 97 mmol/L — ABNORMAL LOW (ref 98–111)
Creatinine, Ser: 0.41 mg/dL — ABNORMAL LOW (ref 0.44–1.00)
GFR, Estimated: >60 mL/min (ref >60)
Glucose, Bld: 124 mg/dL — ABNORMAL HIGH (ref 70–99)
Potassium: 4 mmol/L (ref 3.5–5.1)
Sodium: 136 mmol/L (ref 135–145)

## 2019-12-28 NOTE — Plan of Care (Signed)

## 2019-12-28 NOTE — Progress Notes (Signed)
[facility] PROGRESS NOTE  Amber [name] ZCH:[id number] DOB: 24-Mar-1990 DOA: 11/30/2019 PCP: Patient, No Pcp Per     12/15  Status: Remains inpatient appropriate because:Ongoing active pain requiring inpatient pain management, Ongoing diagnostic testing needed not appropriate for outpatient work up, Unsafe d/c plan, IV treatments appropriate due to intensity of illness or inability to take PO and Inpatient level of care appropriate due to severity of illness   Dispo: The patient is from: Home              Anticipated d/c is to: Home              Anticipated d/c date is: > 3 days (01/16/20)              Patient currently is not medically stable to d/c.  Patient's primary reason for remaining in the hospital is to complete 6 weeks of IV and oral antibiotics for her endocarditis with septic emboli .  She is currently requiring O2 2/2 sequelae from septic emboli.  Code Status: Full Family Communication: Patient; mother at bedside on 12/14  DVT prophylaxis: SCDs 2/2 recent thrombocytopenia due to sepsis Vaccination status: Has not been vaccinated against Covid  Foley catheter: No  HPI: 29 year old female patient with known IV drug abuse with known tricuspid MRSA endocarditis who signed out AMA x2 prior to this admission.  She presented with progressive infection.  On 10/21 through 10/23 she was admitted with fever with blood cultures positive for MRSA.  CT of the chest demonstrated septic emboli and edematous L4-5 facet joint (? Evolving osteomyelitis) as well as tricuspid vegetation on CT.  She left AMA.  She returned 2 hours later on 10/23 with severe back pain.  Underwent TEE that demonstrated a 1 cm tricuspid valve mass.  She was evaluated by CT surgery who recommended medical management.  Again she left AMA on 10/25.  Patient was staying with friends but presented back to the hospital due to increasing shortness of breath, weakness, fatigue malaise and nausea.  She arrived in the  parking lot obtunded and was found to have multiple needles in her car on 11/17.  In the ER she was hypotensive and started on pressors.  Hemoglobin was low and she was given a blood transfusion.  She was also started on empiric IV vancomycin for known MRSA infection  and was admitted to the critical care service.  She eventually was taken to the OR for angio VAC during this admission.  She has subsequently transitioned out of ICU to stepdown unit.  She has had an IV infiltration and has a superficial wound on her left forearm. ID continues to follow regarding the endocarditis and recommends a total of 6 weeks therapy be daptomycin.  She has continued to have hypoxemia and productive cough and on 11/30 ID added doxycycline.  She is reporting pleuritic chest pain as well.  She is also very malnourished and was started on tube feedings and developed mild electrolyte disturbances from refeeding syndrome which have resolved.  She has low-grade resting tachycardia with no significant abnormalities on echo and cardiology previously did not suspect cardiac etiology to her tachycardia.  Hepatitis C antibody has been positive; she is HIV negative.  She has had persistent elevation in her LFTs.  She is currently on methadone 20 mg 3 times a day but will need to be on no more than 40 mg per 24 hours at time of discharge to be eligible for outpatient methadone treatment.  Subjective: Awake.  States fingers feel better now that we have stopped checking CBGs.  Left upper extremity dressing changed last night due to issue with dressing.  No complaints.  States has okay appetite.  Objective: Vitals:   12/28/19 0757 12/28/19 1156  BP: 119/84 (!) 160/70  Pulse:  (!) 103  Resp:  (!) 22  Temp: 98.1 F (36.7 C) 98.5 F (36.9 C)  SpO2: 100%     Intake/Output Summary (Last 24 hours) at 12/28/2019 1205 Last data filed at 12/28/2019 1000 Gross per 24 hour  Intake 480 ml  Output --  Net 480 ml   Filed Weights    12/26/19 0317 12/27/19 0325 12/28/19 0339  Weight: 44.2 kg 44.1 kg 43.4 kg    Exam: General: Awake, pleasant without distress Pulmonary: Lung sounds clear to auscultation, no increased work of breathing, stable on room air Cardiac: S1 S2 without any murmurs, is regular with subtle resting tachycardia, no peripheral edema-appropriate capillary refill Abdomen: Soft nontender nondistended.  Tolerating diet- LBM 12/15 Extremities: Symmetrical, left upper extremity wound covered by dressing   Assessment/Plan: Acute problems: Septic shock 2/2 MRSA bacteremia and associated TV endocarditis status post angio VAC/abnormal lumbar imaging -12/6 is day 19/42 days of IV daptomycin via PICC inserted 11/30 (last dose should be 01/16/20)-CK stable -ID following intermittently -Repeat imaging on 11/23 revealed no evidence of infection in lumbar spine.  With follow-up imaging on 12/9 unremarkable except for soft tissue edema.  Acute hypoxemic respiratory failure (multifactorial) 1 acute sepsis 2 septic emboli with sequela of small pleural effusion -Sepsis physiology and hypoxemia resolved -Oral doxycycline for sequela from septic pulmonary emboli-will DC at discharge -Continue supportive care for pleuritic chest discomfort -12/14 per PT note: "Pt is on 2L O2 via [place] at rest with SaO2 100%O2. Removed [place] and SaO2 96%O2. Pt able to ambulate on RA with SaO2 >94%O2 with return to supine on RA SaO2 93%O2." -Encourage use of incentive spirometry  Persistent tachycardia/tricuspid valve insufficiency -Evaluated by CVTS and currently not a surgical candidate for cardiac valve replacement giving active IVDA prior to current admission (although was appropriate for angiovac surgery) -Echocardiogram: mild systolic LV dysfunction -cardiology felt this was  physiologic response to hypoalbuminemia/malnutrition, deconditioning and anemia. -Cardiology recommended that may not suppress physiologic tachycardia with  beta-blockers nor utilize diuretics prophylactically due to increased risk of harm   LBP -Musculoskeletal in etiology noting no significant abnormalities on recent MRI -Continue ibuprofen prn and lidocaine patch  Severe protein calorie malnutrition with associated refeeding syndrome -Initial BMI 17 patient briefly required tube feedings which have now been discontinued. -BMI continues to trend upward on regular diet with supplementation as below Ensure feeding supplements Nutrition Problem: Severe Malnutrition Etiology: acute illness (MRSA endocarditis) Signs/Symptoms: energy intake < or equal to 50% for > or equal to 5 days,percent weight loss (8.5% weight loss in less than 2 months) Percent weight loss: 8.5 % Interventions: Ensure Enlive (each supplement provides 350kcal and 20 grams of protein),Magic cup,MVI Estimated body mass index is 17.49 kg/m as calculated from the following:   Height as of this encounter:  (1.575 m).   Weight as of this encounter: 43.4 kg.  Skin wound secondary to IV infiltration/right groin wound/right neck incision   11/30/19                        12/20/19  -ID also following along regarding this wound.  No indication to change current management and no indication to  consult surgery -Dark eschar fell off on 12/10 -Continue gabapentin 300 mg TID  Incision (Closed) 12/05/19 Neck Right (Active)  Date First Assessed/Time First Assessed: 12/05/19 1017   Location: Neck  Location Orientation: Right    Assessments 12/05/2019 12:00 PM 12/28/2019  8:00 AM  Dressing Type Gauze (Comment) None  Dressing Clean;Dry;Intact Clean;Dry  Dressing Change Frequency PRN --  Site / Wound Assessment Clean;Dry --  Drainage Amount None --     No Linked orders to display     Incision (Closed) 12/05/19 Groin Right (Active)  Date First Assessed/Time First Assessed: 12/05/19 1017   Location: Groin  Location Orientation: Right    Assessments 12/05/2019 12:00 PM 12/28/2019   8:00 AM  Dressing Type Gauze (Comment) None  Dressing Dry;Intact;Clean --  Dressing Change Frequency PRN --  Site / Wound Assessment Clean;Dry --  Drainage Amount None --     No Linked orders to display     Wound / Incision (Open or Dehisced) 12/10/19 Non-pressure wound Arm Left;Posterior;Lateral infiltration/phlebitis (Active)  Date First Assessed/Time First Assessed: 12/10/19 1915   Wound Type: Non-pressure wound  Location: Arm  Location Orientation: Left;Posterior;Lateral  Wound Description (Comments): infiltration/phlebitis    Assessments 12/10/2019 11:45 PM 12/28/2019  8:00 AM  Dressing Type Thin film;Non adherent;Gauze (Comment) Non adherent;Gauze (Comment)  Dressing Changed New Changed  Dressing Status Intact Clean;Dry;Intact  Dressing Change Frequency PRN Daily  Site / Wound Assessment Painful;Red;Bleeding --  Closure None --  Drainage Amount Minimal --  Drainage Description Serosanguineous --  Treatment Other (Comment) Cleansed     No Linked orders to display    Chronic IV drug abuse with opiates/recent withdrawal syndrome -12/13: Discussed with patient that plan is to decrease to discharge dose of methadone 40 mg daily by next Monday 12/20 -Pam has been decreased to 0.25 mg BID and dose will be decreased to once daily today  GAD -As noted above weaning and decreasing benzodiazepine -Discussed with patient's mother and as I have also noted patient has significant anxiety issues -12/10: Lexapro 10 mg HS initiated and plan to continue after discharge      Other problems: Anemia of chronic disease/recent sepsis related thrombocytopenia -Thrombocytopenia has resolved -Hemoglobin stable around 8 -With initiation of NSAIDs daily Protonix has been started  Hyponatremia -Current sodium stable at 133  Hep C antibody positive with persistent transaminitis -Currently has nonobstructive transaminitis that has improved with improvement in nutrition -As of 12/3 albumin  2.0, AST 52, ALT normal at 42 and total bilirubin has been normal -HCVRNA quantitative pending   Data Reviewed: Basic Metabolic Panel: Recent Labs  Lab 12/24/19 0325 12/28/19 0530  NA 136 136  K 3.8 4.0  CL 100 97*  CO2 28 31  GLUCOSE 76 124*  BUN 6 8  CREATININE 0.35* 0.41*  CALCIUM 8.5* 8.8*   Liver Function Tests: No results for input(s): AST, ALT, ALKPHOS, BILITOT, PROT, ALBUMIN in the last 168 hours. No results for input(s): LIPASE, AMYLASE in the last 168 hours. No results for input(s): AMMONIA in the last 168 hours. CBC: Recent Labs  Lab 12/24/19 0325  WBC 6.6  HGB 7.6*  HCT 25.8*  MCV 91.8  PLT 257   Cardiac Enzymes: Recent Labs  Lab 12/22/19 0535  CKTOTAL 14*   BNP (last 3 results) Recent Labs    11/30/19 1809  BNP 583.0*    ProBNP (last 3 results) No results for input(s): PROBNP in the last 8760 hours.  CBG: Recent Labs  Lab 12/27/19 0207  12/27/19 0347 12/27/19 0503 12/27/19 0858 12/27/19 1145  GLUCAP 104* 68* 120* 99 78    No results found for this or any previous visit (from the past 240 hour(s)).   Studies: No results found.  Scheduled Meds: . chlorhexidine  15 mL Mouth Rinse BID  . Chlorhexidine Gluconate Cloth  6 each Topical Daily  . clonazepam  0.25 mg Oral Daily  . collagenase   Topical Daily  . diclofenac Sodium  4 g Topical QID  . doxycycline  100 mg Oral Q12H  . escitalopram  10 mg Oral QHS  . feeding supplement  237 mL Oral TID BM  . gabapentin  300 mg Oral TID  . lidocaine  2 patch Transdermal Q24H  . mouth rinse  15 mL Mouth Rinse q12n4p  . methadone  20 mg Oral Q8H  . multivitamin with minerals  1 tablet Oral Daily  . pantoprazole  40 mg Oral Daily  . sodium chloride flush  10-40 mL Intracatheter Q12H  . sodium chloride flush  10-40 mL Intracatheter Q12H   Continuous Infusions: . sodium chloride 10 mL/hr at 12/05/19 0617  . sodium chloride Stopped (12/19/19 2156)  . DAPTOmycin (CUBICIN)  IV 500 mg (12/27/19  2022)    Principal Problem:   MRSA infection Active Problems:   Septic shock (HCC)   Opioid use with withdrawal (HCC)   Endocarditis of tricuspid valve   Palliative care encounter   Protein-calorie malnutrition, severe   Consultants:  PCCM  Infectious disease  CVTS  Cardiology  Palliative medicine  Procedures:  10/22 echocardiogram  10/24 TEE  11/17 complete echocardiogram  11/22 intraoperative TEE/application of angio [facility]  11/24 core track  11/29 Limited echocardiogram  Antibiotics: Anti-infectives (From admission, onward)   Start     Dose/Rate Route Frequency Ordered Stop   12/13/19 1115  doxycycline (VIBRA-TABS) tablet 100 mg        100 mg Oral Every 12 hours 12/13/19 1027 01/16/20 2359   12/07/19 2000  DAPTOmycin (CUBICIN) 500 mg in sodium chloride 0.9 % IVPB        500 mg 220 mL/hr over 30 Minutes Intravenous Daily 12/06/19 1345 01/16/20 2359   12/02/19 1400  vancomycin (VANCOREADY) IVPB 500 mg/100 mL  Status:  Discontinued        500 mg 100 mL/hr over 60 Minutes Intravenous Every 8 hours 12/02/19 1127 12/02/19 1131   12/02/19 1400  vancomycin (VANCOREADY) IVPB 750 mg/150 mL  Status:  Discontinued        750 mg 150 mL/hr over 60 Minutes Intravenous Every 8 hours 12/02/19 1131 12/06/19 1339   12/01/19 1200  vancomycin (VANCOREADY) IVPB 500 mg/100 mL  Status:  Discontinued        500 mg 100 mL/hr over 60 Minutes Intravenous Every 24 hours 11/30/19 1454 12/01/19 0946   12/01/19 1200  vancomycin (VANCOCIN) IVPB 1000 mg/200 mL premix  Status:  Discontinued        1,000 mg 200 mL/hr over 60 Minutes Intravenous Every 24 hours 12/01/19 0946 12/02/19 1127   11/30/19 1800  piperacillin-tazobactam (ZOSYN) IVPB 3.375 g  Status:  Discontinued        3.375 g 12.5 mL/hr over 240 Minutes Intravenous Every 8 hours 11/30/19 1454 12/02/19 0234   11/30/19 1115  piperacillin-tazobactam (ZOSYN) IVPB 3.375 g        3.375 g 100 mL/hr over 30 Minutes Intravenous  Once  11/30/19 1110 11/30/19 1306   11/30/19 1115  vancomycin (VANCOCIN) IVPB 1000 mg/200 mL  premix        1,000 mg 200 mL/hr over 60 Minutes Intravenous  Once 11/30/19 1110 11/30/19 1337       Time spent: 20 minutes    Junious Silkllison Terriyah Westra ANP  [facility] Pager [phone].

## 2019-12-29 LAB — CK: Total CK: 10 U/L — ABNORMAL LOW (ref 38–234)

## 2019-12-29 NOTE — Progress Notes (Signed)
Admitted from 2C, alert and oriented, not in any distress, lines intact. VSS. Oriented to room and staff, will continue to monitor.

## 2019-12-29 NOTE — Progress Notes (Signed)
Physical Therapy Treatment Patient Details Name: Amber [name] MRN: [id number] DOB: 1990/03/12 Today's Date: 12/29/2019    History of Present Illness 29 yo admitted 11/17 with septic shock due to MRSA endocarditis with IVDU fentanyl and heroin day of admission. PMhx: admission 10/23-10/26 for endocarditis left AMA, IVDU, cellulitis, depression    PT Comments    Pt is happy to see therapy today, reports she is being moved to another unit today. Pt with c/o of L hip pain today that is worse with ambulation, however pt is agreeable to walking. Pt is supervision for bed mobility, transfers and min guard for ambulation of 200 feet. Pt able to walk with steady gait for 150 feet, when pt began limping and utilizing heavy UE support to advance L LE. D/c plans continue to be appropriate. PT will continue to follow acutely.    Follow Up Recommendations  Home health PT;Supervision/Assistance - 24 hour     Equipment Recommendations  Rolling walker with 5" wheels;3in1 (PT)    Recommendations for Other Services OT consult     Precautions / Restrictions Precautions Precautions: Fall Restrictions Weight Bearing Restrictions: No    Mobility  Bed Mobility Overal bed mobility: Needs Assistance Bed Mobility: Supine to Sit;Sit to Supine     Supine to sit: Supervision;HOB elevated Sit to supine: Supervision;HOB elevated   General bed mobility comments: supervision for safety and line mangement  Transfers Overall transfer level: Needs assistance Equipment used: None Transfers: Sit to/from Stand Sit to Stand: Supervision         General transfer comment: supervision for power up and steadying from bed and from toilet  Ambulation/Gait Ambulation/Gait assistance: Min guard Gait Distance (Feet): 200 Feet Assistive device: Rolling walker (2 wheeled) Gait Pattern/deviations: Step-through pattern;Decreased step length - right;Decreased step length - left;Decreased weight shift to  left Gait velocity: slowed Gait velocity interpretation: <1.31 ft/sec, indicative of household ambulator General Gait Details: min guard for safety with slowed gait, is reporting increase L hip pain with moving today         Balance Overall balance assessment: Needs assistance Sitting-balance support: No upper extremity supported;Feet supported Sitting balance-[name]: Good     Standing balance support: No upper extremity supported;During functional activity Standing balance-[name]: Fair Standing balance comment: able to static stand EoB                            Cognition Arousal/Alertness: Awake/alert Behavior During Therapy: WFL for tasks assessed/performed Overall Cognitive Status: Within Functional Limits for tasks assessed                               Problem Solving: Slow processing;Requires verbal cues General Comments: continues to be self-limiting         General Comments General comments (skin integrity, edema, etc.): ambulated on RA HR 121-129 bpm with ambulation      Pertinent Vitals/Pain Pain Assessment: 0-10 Pain Score: 4  Pain Location: L hip Pain Descriptors / Indicators: Aching;Guarding;Stabbing Pain Intervention(s): Limited activity within patient's tolerance;Monitored during session;Repositioned           PT Goals (current goals can now be found in the care plan section) Acute Rehab PT Goals Patient Stated Goal: Stop coughing PT Goal Formulation: With patient Time For Goal Achievement: 12/23/19 Potential to Achieve Goals: Fair Progress towards PT goals: Progressing toward goals    Frequency  Min 3X/week      PT Plan Current plan remains appropriate    Co-evaluation              AM-PAC PT "6 Clicks" Mobility   Outcome Measure  Help needed turning from your back to your side while in a flat bed without using bedrails?: None Help needed moving from lying on your back to sitting on the side  of a flat bed without using bedrails?: A Little Help needed moving to and from a bed to a chair (including a wheelchair)?: A Little Help needed standing up from a chair using your arms (e.g., wheelchair or bedside chair)?: A Little Help needed to walk in hospital room?: A Little Help needed climbing 3-5 steps with a railing? : A Little 6 Click Score: 19    End of Session   Activity Tolerance: Other (comment);Patient limited by pain (increased L hip pain today) Patient left: in bed;with call bell/phone within reach Nurse Communication: Mobility status PT Visit Diagnosis: Other abnormalities of gait and mobility (R26.89);Muscle weakness (generalized) (M62.81);Pain Pain - Right/Left: Left Pain - part of body: Hip     Time: 9381-8299 PT Time Calculation (min) (ACUTE ONLY): 21 min  Charges:  $Gait Training: 8-22 mins                     Derran Sear B. Beverely Risen PT, DPT Acute Rehabilitation Services Pager [phone] Office [phone]    Elon Alas Fleet 12/29/2019, 2:53 PM

## 2019-12-29 NOTE — Progress Notes (Signed)
[facility] PROGRESS NOTE  VERNE COVE DUK:[id number] DOB: 1990-08-01 DOA: 11/30/2019 PCP: Patient, No Pcp Per     12/15  Status: Remains inpatient appropriate because:Ongoing active pain requiring inpatient pain management, Ongoing diagnostic testing needed not appropriate for outpatient work up, Unsafe d/c plan, IV treatments appropriate due to intensity of illness or inability to take PO and Inpatient level of care appropriate due to severity of illness   Dispo: The patient is from: Home              Anticipated d/c is to: Home              Anticipated d/c date is: > 3 days (01/16/20)              Patient currently is not medically stable to d/c.  Patient's primary reason for remaining in the hospital is to complete 6 weeks of IV and oral antibiotics for her endocarditis with septic emboli .  She is currently requiring O2 2/2 sequelae from septic emboli.  Code Status: Full Family Communication: Patient; mother at bedside on 12/14  DVT prophylaxis: SCDs 2/2 recent thrombocytopenia due to sepsis Vaccination status: Has not been vaccinated against Covid  Foley catheter: No  HPI: 29 year old female patient with known IV drug abuse with known tricuspid MRSA endocarditis who signed out AMA x2 prior to this admission.  She presented with progressive infection.  On 10/21 through 10/23 she was admitted with fever with blood cultures positive for MRSA.  CT of the chest demonstrated septic emboli and edematous L4-5 facet joint (? Evolving osteomyelitis) as well as tricuspid vegetation on CT.  She left AMA.  She returned 2 hours later on 10/23 with severe back pain.  Underwent TEE that demonstrated a 1 cm tricuspid valve mass.  She was evaluated by CT surgery who recommended medical management.  Again she left AMA on 10/25.  Patient was staying with friends but presented back to the hospital due to increasing shortness of breath, weakness, fatigue malaise and nausea.  She arrived in the  parking lot obtunded and was found to have multiple needles in her car on 11/17.  In the ER she was hypotensive and started on pressors.  Hemoglobin was low and she was given a blood transfusion.  She was also started on empiric IV vancomycin for known MRSA infection  and was admitted to the critical care service.  She eventually was taken to the OR for angio VAC during this admission.  She has subsequently transitioned out of ICU to stepdown unit.  She has had an IV infiltration and has a superficial wound on her left forearm. ID continues to follow regarding the endocarditis and recommends a total of 6 weeks therapy be daptomycin.  She has continued to have hypoxemia and productive cough and on 11/30 ID added doxycycline.  She is reporting pleuritic chest pain as well.  She is also very malnourished and was started on tube feedings and developed mild electrolyte disturbances from refeeding syndrome which have resolved.  She has low-grade resting tachycardia with no significant abnormalities on echo and cardiology previously did not suspect cardiac etiology to her tachycardia.  Hepatitis C antibody has been positive; she is HIV negative.  She has had persistent elevation in her LFTs.  She is currently on methadone 20 mg 3 times a day but will need to be on no more than 40 mg per 24 hours at time of discharge to be eligible for outpatient methadone treatment.  Subjective: Awakened.  No specific complaints.  Denies shortness of breath or abdominal discomfort.  Objective: Vitals:   12/28/19 1943 12/29/19 0348  BP: 116/69 97/66  Pulse: (!) 105 98  Resp: 20 17  Temp: 98 F (36.7 C) 98.5 F (36.9 C)  SpO2: 98% 99%    Intake/Output Summary (Last 24 hours) at 12/29/2019 0934 Last data filed at 12/28/2019 1000 Gross per 24 hour  Intake 240 ml  Output --  Net 240 ml   Filed Weights   12/27/19 0325 12/28/19 0339 12/29/19 0348  Weight: 44.1 kg 43.4 kg 43.3 kg    Exam: General: Awake, calm, no  acute distress Pulmonary: Bilateral lung sounds clear to auscultation anteriorly, room air, no reported dyspnea on exertion Cardiac: Heart sounds S1-S2 without rubs murmurs thrills or gallops, no JVD peripheral edema, adequate capillary refill Abdomen: Soft and scaphoid, bowel sounds present, tolerating solid diet. LBM 12/15 Extremities: Symmetrical, dressing clean dry and intact and has recently been changed.  Self-report back patient states wound is significantly improving and is having less pain especially with wound care   Assessment/Plan: Acute problems: Septic shock 2/2 MRSA bacteremia and associated TV endocarditis status post angio VAC/abnormal lumbar imaging -12/6 is day 19/42 days of IV daptomycin via PICC inserted 11/30 (last dose should be 01/16/20)-CK stable -ID following  -Repeat lumbar MRI revealed no evidence of infection in lumbar spine or other significant abnormality  Acute hypoxemic respiratory failure (multifactorial) 1 acute sepsis 2 septic emboli with sequela of small pleural effusion -Sepsis physiology and hypoxemia resolved -Oral doxycycline for sequela from septic pulmonary emboli-will DC at discharge -Continue supportive care for pleuritic chest discomfort -12/14 per PT note: "Pt is on 2L O2 via [place] at rest with SaO2 100%O2. Removed [place] and SaO2 96%O2. Pt able to ambulate on RA with SaO2 >94%O2 with return to supine on RA SaO2 93%O2." -Encourage use of incentive spirometry  Persistent tachycardia/tricuspid valve insufficiency -Evaluated by CVTS and currently not a surgical candidate for cardiac valve replacement giving active IVDA prior to current admission (although was appropriate for angiovac surgery) -Echocardiogram: mild systolic LV dysfunction -cardiology felt this was  physiologic response to hypoalbuminemia/malnutrition, deconditioning and anemia. -Cardiology recommended that may not suppress physiologic tachycardia with beta-blockers nor utilize diuretics  prophylactically due to increased risk of harm   LBP -Musculoskeletal in etiology noting no significant abnormalities on recent MRI -Continue supportive care with ibuprofen prn and lidocaine patch  Severe protein calorie malnutrition with associated refeeding syndrome -Initial BMI 17 patient briefly required tube feedings which have now been discontinued. -BMI continues to trend upward on regular diet with supplementation as below Ensure feeding supplements Nutrition Status: Nutrition Problem: Severe Malnutrition Etiology: acute illness (MRSA endocarditis) Signs/Symptoms: energy intake < or equal to 50% for > or equal to 5 days,percent weight loss (8.5% weight loss in less than 2 months) Percent weight loss: 8.5 % Interventions: Ensure Enlive (each supplement provides 350kcal and 20 grams of protein),Magic cup,MVI Estimated body mass index is 17.46 kg/m as calculated from the following:   Height as of this encounter: 5\' 2"  (1.575 m).   Weight as of this encounter: 43.3 kg.  Skin wound secondary to IV infiltration/right groin wound/right neck incision   11/30/19                        12/20/19  -ID also following along regarding this wound.  No indication to change current management and no indication to consult surgery -  Dark eschar fell off on 12/10 -Continue gabapentin 300 mg TID  Incision (Closed) 12/05/19 Neck Right (Active)  Date First Assessed/Time First Assessed: 12/05/19 1017   Location: Neck  Location Orientation: Right    Assessments 12/05/2019 12:00 PM 12/29/2019  3:48 AM  Dressing Type Gauze (Comment) None  Dressing Clean;Dry;Intact --  Dressing Change Frequency PRN --  Site / Wound Assessment Clean;Dry --  Drainage Amount None --     No Linked orders to display     Incision (Closed) 12/05/19 Groin Right (Active)  Date First Assessed/Time First Assessed: 12/05/19 1017   Location: Groin  Location Orientation: Right    Assessments 12/05/2019 12:00 PM 12/28/2019   8:00 AM  Dressing Type Gauze (Comment) None  Dressing Dry;Intact;Clean --  Dressing Change Frequency PRN --  Site / Wound Assessment Clean;Dry --  Drainage Amount None --     No Linked orders to display     Wound / Incision (Open or Dehisced) 12/10/19 Non-pressure wound Arm Left;Posterior;Lateral infiltration/phlebitis (Active)  Date First Assessed/Time First Assessed: 12/10/19 1915   Wound Type: Non-pressure wound  Location: Arm  Location Orientation: Left;Posterior;Lateral  Wound Description (Comments): infiltration/phlebitis    Assessments 12/10/2019 11:45 PM 12/29/2019  3:48 AM  Dressing Type Thin film;Non adherent;Gauze (Comment) Gauze (Comment);Non adherent  Dressing Changed New --  Dressing Status Intact Clean;Dry;Intact  Dressing Change Frequency PRN --  Site / Wound Assessment Painful;Red;Bleeding --  Closure None --  Drainage Amount Minimal --  Drainage Description Serosanguineous --  Treatment Other (Comment) --     No Linked orders to display    Chronic IV drug abuse with opiates/recent withdrawal syndrome -12/13: Discussed with patient that plan is to decrease to discharge dose of methadone 40 mg daily by next Monday 12/20 -Pam has been decreased to 0.25 mg BID and dose will be decreased to once daily today  GAD -As noted above weaning and decreasing benzodiazepine -Discussed with patient's mother and as I have also noted patient has significant anxiety issues -12/10: Lexapro 10 mg HS initiated and plan to continue after discharge      Other problems: Anemia of chronic disease/recent sepsis related thrombocytopenia -Thrombocytopenia has resolved -Hemoglobin stable around 8 -With initiation of NSAIDs daily Protonix has been started  Hyponatremia -Current sodium stable at 133  Hep C antibody positive with persistent transaminitis -Currently has nonobstructive transaminitis that has improved with improvement in nutrition -As of 12/3 albumin 2.0, AST 52, ALT  normal at 42 and total bilirubin has been normal -HCVRNA quantitative pending   Data Reviewed: Basic Metabolic Panel: Recent Labs  Lab 12/24/19 0325 12/28/19 0530  NA 136 136  K 3.8 4.0  CL 100 97*  CO2 28 31  GLUCOSE 76 124*  BUN 6 8  CREATININE 0.35* 0.41*  CALCIUM 8.5* 8.8*   Liver Function Tests: No results for input(s): AST, ALT, ALKPHOS, BILITOT, PROT, ALBUMIN in the last 168 hours. No results for input(s): LIPASE, AMYLASE in the last 168 hours. No results for input(s): AMMONIA in the last 168 hours. CBC: Recent Labs  Lab 12/24/19 0325  WBC 6.6  HGB 7.6*  HCT 25.8*  MCV 91.8  PLT 257   Cardiac Enzymes: Recent Labs  Lab 12/29/19 0500  CKTOTAL 10*   BNP (last 3 results) Recent Labs    11/30/19 1809  BNP 583.0*    ProBNP (last 3 results) No results for input(s): PROBNP in the last 8760 hours.  CBG: Recent Labs  Lab 12/27/19 0207 12/27/19 0347  12/27/19 0503 12/27/19 0858 12/27/19 1145  GLUCAP 104* 68* 120* 99 78    No results found for this or any previous visit (from the past 240 hour(s)).   Studies: No results found.  Scheduled Meds: . chlorhexidine  15 mL Mouth Rinse BID  . Chlorhexidine Gluconate Cloth  6 each Topical Daily  . clonazepam  0.25 mg Oral Daily  . collagenase   Topical Daily  . diclofenac Sodium  4 g Topical QID  . doxycycline  100 mg Oral Q12H  . escitalopram  10 mg Oral QHS  . feeding supplement  237 mL Oral TID BM  . gabapentin  300 mg Oral TID  . lidocaine  2 patch Transdermal Q24H  . mouth rinse  15 mL Mouth Rinse q12n4p  . methadone  20 mg Oral Q8H  . multivitamin with minerals  1 tablet Oral Daily  . pantoprazole  40 mg Oral Daily  . sodium chloride flush  10-40 mL Intracatheter Q12H  . sodium chloride flush  10-40 mL Intracatheter Q12H   Continuous Infusions: . sodium chloride 10 mL/hr at 12/05/19 0617  . sodium chloride Stopped (12/19/19 2156)  . DAPTOmycin (CUBICIN)  IV 500 mg (12/28/19 2121)     Principal Problem:   MRSA infection Active Problems:   Septic shock (HCC)   Opioid use with withdrawal (HCC)   Endocarditis of tricuspid valve   Palliative care encounter   Protein-calorie malnutrition, severe   Consultants:  PCCM  Infectious disease  CVTS  Cardiology  Palliative medicine  Procedures:  10/22 echocardiogram  10/24 TEE  11/17 complete echocardiogram  11/22 intraoperative TEE/application of angio [facility]  11/24 core track  11/29 Limited echocardiogram  Antibiotics: Anti-infectives (From admission, onward)   Start     Dose/Rate Route Frequency Ordered Stop   12/13/19 1115  doxycycline (VIBRA-TABS) tablet 100 mg        100 mg Oral Every 12 hours 12/13/19 1027 01/16/20 2359   12/07/19 2000  DAPTOmycin (CUBICIN) 500 mg in sodium chloride 0.9 % IVPB        500 mg 220 mL/hr over 30 Minutes Intravenous Daily 12/06/19 1345 01/16/20 2359   12/02/19 1400  vancomycin (VANCOREADY) IVPB 500 mg/100 mL  Status:  Discontinued        500 mg 100 mL/hr over 60 Minutes Intravenous Every 8 hours 12/02/19 1127 12/02/19 1131   12/02/19 1400  vancomycin (VANCOREADY) IVPB 750 mg/150 mL  Status:  Discontinued        750 mg 150 mL/hr over 60 Minutes Intravenous Every 8 hours 12/02/19 1131 12/06/19 1339   12/01/19 1200  vancomycin (VANCOREADY) IVPB 500 mg/100 mL  Status:  Discontinued        500 mg 100 mL/hr over 60 Minutes Intravenous Every 24 hours 11/30/19 1454 12/01/19 0946   12/01/19 1200  vancomycin (VANCOCIN) IVPB 1000 mg/200 mL premix  Status:  Discontinued        1,000 mg 200 mL/hr over 60 Minutes Intravenous Every 24 hours 12/01/19 0946 12/02/19 1127   11/30/19 1800  piperacillin-tazobactam (ZOSYN) IVPB 3.375 g  Status:  Discontinued        3.375 g 12.5 mL/hr over 240 Minutes Intravenous Every 8 hours 11/30/19 1454 12/02/19 0234   11/30/19 1115  piperacillin-tazobactam (ZOSYN) IVPB 3.375 g        3.375 g 100 mL/hr over 30 Minutes Intravenous  Once 11/30/19  1110 11/30/19 1306   11/30/19 1115  vancomycin (VANCOCIN) IVPB 1000 mg/200 mL premix  1,000 mg 200 mL/hr over 60 Minutes Intravenous  Once 11/30/19 1110 11/30/19 1337       Time spent: 20 minutes    Junious Silkllison Rockne Dearinger ANP  [facility] Pager [phone].

## 2019-12-30 NOTE — Progress Notes (Signed)
[facility] PROGRESS NOTE  VERNE COVE DUK:[id number] DOB: 1990-08-01 DOA: 11/30/2019 PCP: Patient, No Pcp Per     12/15  Status: Remains inpatient appropriate because:Ongoing active pain requiring inpatient pain management, Ongoing diagnostic testing needed not appropriate for outpatient work up, Unsafe d/c plan, IV treatments appropriate due to intensity of illness or inability to take PO and Inpatient level of care appropriate due to severity of illness   Dispo: The patient is from: Home              Anticipated d/c is to: Home              Anticipated d/c date is: > 3 days (01/16/20)              Patient currently is not medically stable to d/c.  Patient's primary reason for remaining in the hospital is to complete 6 weeks of IV and oral antibiotics for her endocarditis with septic emboli .  She is currently requiring O2 2/2 sequelae from septic emboli.  Code Status: Full Family Communication: Patient; mother at bedside on 12/14  DVT prophylaxis: SCDs 2/2 recent thrombocytopenia due to sepsis Vaccination status: Has not been vaccinated against Covid  Foley catheter: No  HPI: 29 year old female patient with known IV drug abuse with known tricuspid MRSA endocarditis who signed out AMA x2 prior to this admission.  She presented with progressive infection.  On 10/21 through 10/23 she was admitted with fever with blood cultures positive for MRSA.  CT of the chest demonstrated septic emboli and edematous L4-5 facet joint (? Evolving osteomyelitis) as well as tricuspid vegetation on CT.  She left AMA.  She returned 2 hours later on 10/23 with severe back pain.  Underwent TEE that demonstrated a 1 cm tricuspid valve mass.  She was evaluated by CT surgery who recommended medical management.  Again she left AMA on 10/25.  Patient was staying with friends but presented back to the hospital due to increasing shortness of breath, weakness, fatigue malaise and nausea.  She arrived in the  parking lot obtunded and was found to have multiple needles in her car on 11/17.  In the ER she was hypotensive and started on pressors.  Hemoglobin was low and she was given a blood transfusion.  She was also started on empiric IV vancomycin for known MRSA infection  and was admitted to the critical care service.  She eventually was taken to the OR for angio VAC during this admission.  She has subsequently transitioned out of ICU to stepdown unit.  She has had an IV infiltration and has a superficial wound on her left forearm. ID continues to follow regarding the endocarditis and recommends a total of 6 weeks therapy be daptomycin.  She has continued to have hypoxemia and productive cough and on 11/30 ID added doxycycline.  She is reporting pleuritic chest pain as well.  She is also very malnourished and was started on tube feedings and developed mild electrolyte disturbances from refeeding syndrome which have resolved.  She has low-grade resting tachycardia with no significant abnormalities on echo and cardiology previously did not suspect cardiac etiology to her tachycardia.  Hepatitis C antibody has been positive; she is HIV negative.  She has had persistent elevation in her LFTs.  She is currently on methadone 20 mg 3 times a day but will need to be on no more than 40 mg per 24 hours at time of discharge to be eligible for outpatient methadone treatment.  Subjective: Awake.  Discussed alternatives to prepackage protein drinks that might be more affordable and provide more variety.  Encouraged to utilize protein powder flavored yogurt to make custom shakes at home.  Verbalized concerns over the fact she is losing some weight after having some gains.  Assured her this is not uncommon especially now that her activity levels are increasing.  Objective: Vitals:   12/30/19 0053 12/30/19 0504  BP: 115/63 122/68  Pulse: 98 92  Resp: 17 18  Temp: 98.2 F (36.8 C) 98.3 F (36.8 C)  SpO2: 100% 100%     Intake/Output Summary (Last 24 hours) at 12/30/2019 1301 Last data filed at 12/30/2019 1122 Gross per 24 hour  Intake 1180 ml  Output 1500 ml  Net -320 ml   Filed Weights   12/29/19 0348 12/29/19 1618 12/30/19 0504  Weight: 43.3 kg 43.1 kg 42.2 kg    Exam: General: Awake, calm, no acute distress Pulmonary: Lung sounds are clear to auscultation anteriorly, stable on room air, no dyspnea on exertion Cardiac: Heart sounds S1-S2, no JVD peripheral edema, adequate capillary refill Abdomen: Soft and scaphoid, bowel sounds present, tolerating solid diet. LBM 12/17 Extremities: Symmetrical, LUE wound base is clean with minimal fibrin and pink.  Dressing reapplied by nursing staff.   Assessment/Plan: Acute problems: Septic shock 2/2 MRSA bacteremia and associated TV endocarditis status post angio VAC/abnormal lumbar imaging -ID commended 42 days of IV daptomycin via PICC inserted 11/30 (last dose should be 01/16/20)-CK stable -ID following  -Repeat lumbar MRI revealed no evidence of infection in lumbar spine or other significant abnormality  Acute hypoxemic respiratory failure (multifactorial) 1 acute sepsis 2 septic emboli with sequela of small pleural effusion -Sepsis physiology and hypoxemia resolved -Continue oral doxycycline for sequela from septic pulmonary emboli-will DC at discharge -Continue supportive care for pleuritic chest discomfort -12/14 per PT note: "Pt is on 2L O2 via [place] at rest with SaO2 100%O2. Removed [place] and SaO2 96%O2. Pt able to ambulate on RA with SaO2 >94%O2 with return to supine on RA SaO2 93%O2." -Encourage use of incentive spirometry  Persistent tachycardia/tricuspid valve insufficiency -Evaluated by CVTS and currently not a surgical candidate for cardiac valve replacement giving active IVDA prior to current admission (although was appropriate for angiovac surgery) -Echocardiogram: mild systolic LV dysfunction -cardiology felt this was  physiologic  response to hypoalbuminemia/malnutrition, deconditioning and anemia. -Cardiology recommended that may not suppress physiologic tachycardia with beta-blockers nor utilize diuretics prophylactically due to increased risk of harm   LBP -Musculoskeletal in etiology noting no significant abnormalities on recent MRI -Continue supportive care with ibuprofen prn and lidocaine patch  Severe protein calorie malnutrition with associated refeeding syndrome -Initial BMI 17 patient briefly required tube feedings which have now been discontinued. -BMI continues to trend upward on regular diet with supplementation as below Ensure feeding supplements Nutrition Status: Nutrition Problem: Severe Malnutrition Etiology: acute illness (MRSA endocarditis) Signs/Symptoms: energy intake < or equal to 50% for > or equal to 5 days,percent weight loss (8.5% weight loss in less than 2 months) Percent weight loss: 8.5 % Interventions: Ensure Enlive (each supplement provides 350kcal and 20 grams of protein),Magic cup,MVI Estimated body mass index is 17.57 kg/m as calculated from the following:   Height as of this encounter: 5\' 1"  (1.549 m).   Weight as of this encounter: 42.2 kg.  Skin wound secondary to IV infiltration/right groin wound/right neck incision     11/30/19  12/20/19                         12/30/19  -ID also following along regarding this wound.  No indication to change current management and no indication to consult surgery -Dark eschar fell off on 12/10 as evidenced by pictures above wound continues to improve -Continue gabapentin 300 mg TID  Incision (Closed) 12/05/19 Neck Right (Active)  Date First Assessed/Time First Assessed: 12/05/19 1017   Location: Neck  Location Orientation: Right    Assessments 12/05/2019 12:00 PM 12/29/2019  3:48 AM  Dressing Type Gauze (Comment) None  Dressing Clean;Dry;Intact --  Dressing Change Frequency PRN --  Site / Wound Assessment  Clean;Dry --  Drainage Amount None --     No Linked orders to display     Incision (Closed) 12/05/19 Groin Right (Active)  Date First Assessed/Time First Assessed: 12/05/19 1017   Location: Groin  Location Orientation: Right    Assessments 12/05/2019 12:00 PM 12/28/2019  8:00 AM  Dressing Type Gauze (Comment) None  Dressing Dry;Intact;Clean --  Dressing Change Frequency PRN --  Site / Wound Assessment Clean;Dry --  Drainage Amount None --     No Linked orders to display     Wound / Incision (Open or Dehisced) 12/10/19 Non-pressure wound Arm Left;Posterior;Lateral infiltration/phlebitis (Active)  Date First Assessed/Time First Assessed: 12/10/19 1915   Wound Type: Non-pressure wound  Location: Arm  Location Orientation: Left;Posterior;Lateral  Wound Description (Comments): infiltration/phlebitis    Assessments 12/10/2019 11:45 PM 12/30/2019 11:00 AM  Dressing Type Thin film;Non adherent;Gauze (Comment) Gauze (Comment)  Dressing Changed New --  Dressing Status Intact Clean;Dry;Intact  Dressing Change Frequency PRN PRN  Site / Wound Assessment Painful;Red;Bleeding Painful;Red;Pink  Closure None --  Drainage Amount Minimal None  Drainage Description Serosanguineous --  Treatment Other (Comment) --     No Linked orders to display    Chronic IV drug abuse with opiates/recent withdrawal syndrome -12/13: Discussed with patient that plan is to decrease to discharge dose of methadone 40 mg daily by next Monday 12/20 -Pam has been decreased to 0.25 mg BID and dose will be decreased to once daily today  GAD -As noted above weaning and decreasing benzodiazepine -Discussed with patient's mother and as I have also noted patient has significant anxiety issues -12/10: Lexapro 10 mg HS initiated and plan to continue after discharge      Other problems: Anemia of chronic disease/recent sepsis related thrombocytopenia -Thrombocytopenia has resolved -Hemoglobin stable around 8 -With  initiation of NSAIDs daily Protonix has been started  Hyponatremia -Current sodium stable at 133  Hep C antibody positive with persistent transaminitis -Currently has nonobstructive transaminitis that has improved with improvement in nutrition -As of 12/3 albumin 2.0, AST 52, ALT normal at 42 and total bilirubin has been normal -HCVRNA quantitative pending   Data Reviewed: Basic Metabolic Panel: Recent Labs  Lab 12/24/19 0325 12/28/19 0530  NA 136 136  K 3.8 4.0  CL 100 97*  CO2 28 31  GLUCOSE 76 124*  BUN 6 8  CREATININE 0.35* 0.41*  CALCIUM 8.5* 8.8*   Liver Function Tests: No results for input(s): AST, ALT, ALKPHOS, BILITOT, PROT, ALBUMIN in the last 168 hours. No results for input(s): LIPASE, AMYLASE in the last 168 hours. No results for input(s): AMMONIA in the last 168 hours. CBC: Recent Labs  Lab 12/24/19 0325  WBC 6.6  HGB 7.6*  HCT 25.8*  MCV 91.8  PLT 257  Cardiac Enzymes: Recent Labs  Lab 12/29/19 0500  CKTOTAL 10*   BNP (last 3 results) Recent Labs    11/30/19 1809  BNP 583.0*    ProBNP (last 3 results) No results for input(s): PROBNP in the last 8760 hours.  CBG: Recent Labs  Lab 12/27/19 0207 12/27/19 0347 12/27/19 0503 12/27/19 0858 12/27/19 1145  GLUCAP 104* 68* 120* 99 78    No results found for this or any previous visit (from the past 240 hour(s)).   Studies: No results found.  Scheduled Meds: . chlorhexidine  15 mL Mouth Rinse BID  . Chlorhexidine Gluconate Cloth  6 each Topical Daily  . clonazepam  0.25 mg Oral Daily  . collagenase   Topical Daily  . diclofenac Sodium  4 g Topical QID  . doxycycline  100 mg Oral Q12H  . escitalopram  10 mg Oral QHS  . feeding supplement  237 mL Oral TID BM  . gabapentin  300 mg Oral TID  . lidocaine  2 patch Transdermal Q24H  . mouth rinse  15 mL Mouth Rinse q12n4p  . methadone  20 mg Oral Q8H  . multivitamin with minerals  1 tablet Oral Daily  . pantoprazole  40 mg Oral Daily   . sodium chloride flush  10-40 mL Intracatheter Q12H  . sodium chloride flush  10-40 mL Intracatheter Q12H   Continuous Infusions: . sodium chloride 10 mL/hr at 12/05/19 0617  . sodium chloride Stopped (12/19/19 2156)  . DAPTOmycin (CUBICIN)  IV Stopped (12/30/19 0756)    Principal Problem:   MRSA infection Active Problems:   Septic shock (HCC)   Opioid use with withdrawal (HCC)   Endocarditis of tricuspid valve   Palliative care encounter   Protein-calorie malnutrition, severe   Consultants:  PCCM  Infectious disease  CVTS  Cardiology  Palliative medicine  Procedures:  10/22 echocardiogram  10/24 TEE  11/17 complete echocardiogram  11[facility]22 [facility]  11/24 core track  11/29 Limited echocardiogram  Antibiotics: Anti-infectives (From admission, onward)   Start     Dose/Rate Route Frequency Ordered Stop   12/13/19 1115  doxycycline (VIBRA-TABS) tablet 100 mg        100 mg Oral Every 12 hours 12/13/19 1027 01/16/20 2359   12/07/19 2000  DAPTOmycin (CUBICIN) 500 mg in sodium chloride 0.9 % IVPB        500 mg 220 mL/hr over 30 Minutes Intravenous Daily 12/06/19 1345 01/16/20 2359   12/02/19 1400  vancomycin (VANCOREADY) IVPB 500 mg/100 mL  Status:  Discontinued        500 mg 100 mL/hr over 60 Minutes Intravenous Every 8 hours 12/02/19 1127 12/02/19 1131   12/02/19 1400  vancomycin (VANCOREADY) IVPB 750 mg/150 mL  Status:  Discontinued        750 mg 150 mL/hr over 60 Minutes Intravenous Every 8 hours 12/02/19 1131 12/06/19 1339   12/01/19 1200  vancomycin (VANCOREADY) IVPB 500 mg/100 mL  Status:  Discontinued        500 mg 100 mL/hr over 60 Minutes Intravenous Every 24 hours 11/30/19 1454 12/01/19 0946   12/01/19 1200  vancomycin (VANCOCIN) IVPB 1000 mg/200 mL premix  Status:  Discontinued        1,000 mg 200 mL/hr over 60 Minutes Intravenous Every 24 hours 12/01/19 0946 12/02/19 1127   11/30/19 1800  piperacillin-tazobactam  (ZOSYN) IVPB 3.375 g  Status:  Discontinued        3.375 g 12.5 mL/hr over 240  Minutes Intravenous Every 8 hours 11/30/19 1454 12/02/19 0234   11/30/19 1115  piperacillin-tazobactam (ZOSYN) IVPB 3.375 g        3.375 g 100 mL/hr over 30 Minutes Intravenous  Once 11/30/19 1110 11/30/19 1306   11/30/19 1115  vancomycin (VANCOCIN) IVPB 1000 mg/200 mL premix        1,000 mg 200 mL/hr over 60 Minutes Intravenous  Once 11/30/19 1110 11/30/19 1337       Time spent: 20 minutes    [name] ANP  [facility] Pager [phone].

## 2019-12-31 NOTE — Progress Notes (Signed)
Patient on the LOS team.  Continue IV abx for 6 weeks (stop date 1/3).  No overnight events. Marlin Canary DO

## 2020-01-01 NOTE — Progress Notes (Signed)
Patient on the LLOS team.  No overnight events.  Continue IV abx through 1/3.  Methadone is being weaned down as well. Marlin Canary DO

## 2020-01-02 MED ORDER — METHADONE HCL 10 MG PO TABS
20.0000 mg | ORAL_TABLET | Freq: Two times a day (BID) | ORAL | Status: DC
Start: 2020-01-02 — End: 2020-01-16
  Administered 2020-01-02 – 2020-01-16 (×27): 20 mg via ORAL
  Filled 2020-01-02 (×28): qty 2

## 2020-01-02 NOTE — Consult Note (Signed)
WOC Nurse wound follow up Patient receiving care in [facility] [id number]. Wound to right arm f/u completed remotely after review of image posted on 12/17. Wound type: healing extravasation wound Measurement: To be provided by the bedside RN in the flowsheet section Wound bed: pink, clean Drainage (amount, consistency, odor)  Periwound: intact Dressing procedure/placement/frequency: santyl to area discontinued.  The following order placed to be started today:  Wash right arm wound with saline. Pat dry. Place a Xeroform gauze over the wound, then secure with kerlex.  Change daily. WOC nurse will not follow at this time.  Please re-consult the WOC team if needed.  Helmut Muster, RN, MSN, CWOCN, CNS-BC, pager [phone]

## 2020-01-02 NOTE — Progress Notes (Signed)
[facility] PROGRESS NOTE  VERNE COVE DUK:[id number] DOB: 1990-08-01 DOA: 11/30/2019 PCP: Patient, No Pcp Per     12/15  Status: Remains inpatient appropriate because:Ongoing active pain requiring inpatient pain management, Ongoing diagnostic testing needed not appropriate for outpatient work up, Unsafe d/c plan, IV treatments appropriate due to intensity of illness or inability to take PO and Inpatient level of care appropriate due to severity of illness   Dispo: The patient is from: Home              Anticipated d/c is to: Home              Anticipated d/c date is: > 3 days (01/16/20)              Patient currently is not medically stable to d/c.  Patient's primary reason for remaining in the hospital is to complete 6 weeks of IV and oral antibiotics for her endocarditis with septic emboli .  She is currently requiring O2 2/2 sequelae from septic emboli.  Code Status: Full Family Communication: Patient; mother at bedside on 12/14  DVT prophylaxis: SCDs 2/2 recent thrombocytopenia due to sepsis Vaccination status: Has not been vaccinated against Covid  Foley catheter: No  HPI: 29 year old female patient with known IV drug abuse with known tricuspid MRSA endocarditis who signed out AMA x2 prior to this admission.  She presented with progressive infection.  On 10/21 through 10/23 she was admitted with fever with blood cultures positive for MRSA.  CT of the chest demonstrated septic emboli and edematous L4-5 facet joint (? Evolving osteomyelitis) as well as tricuspid vegetation on CT.  She left AMA.  She returned 2 hours later on 10/23 with severe back pain.  Underwent TEE that demonstrated a 1 cm tricuspid valve mass.  She was evaluated by CT surgery who recommended medical management.  Again she left AMA on 10/25.  Patient was staying with friends but presented back to the hospital due to increasing shortness of breath, weakness, fatigue malaise and nausea.  She arrived in the  parking lot obtunded and was found to have multiple needles in her car on 11/17.  In the ER she was hypotensive and started on pressors.  Hemoglobin was low and she was given a blood transfusion.  She was also started on empiric IV vancomycin for known MRSA infection  and was admitted to the critical care service.  She eventually was taken to the OR for angio VAC during this admission.  She has subsequently transitioned out of ICU to stepdown unit.  She has had an IV infiltration and has a superficial wound on her left forearm. ID continues to follow regarding the endocarditis and recommends a total of 6 weeks therapy be daptomycin.  She has continued to have hypoxemia and productive cough and on 11/30 ID added doxycycline.  She is reporting pleuritic chest pain as well.  She is also very malnourished and was started on tube feedings and developed mild electrolyte disturbances from refeeding syndrome which have resolved.  She has low-grade resting tachycardia with no significant abnormalities on echo and cardiology previously did not suspect cardiac etiology to her tachycardia.  Hepatitis C antibody has been positive; she is HIV negative.  She has had persistent elevation in her LFTs.  She is currently on methadone 20 mg 3 times a day but will need to be on no more than 40 mg per 24 hours at time of discharge to be eligible for outpatient methadone treatment.  Subjective: Awake.  Has questions regarding my IV fluids continuously attached to PICC line.  Reports this is impairing her mobility.  Would prefer to walk to bathroom instead of using bedside commode.  Objective: Vitals:   01/01/20 1956 01/02/20 0315  BP: (!) 135/56 (!) 139/57  Pulse: 99 98  Resp: 18 18  Temp: 98.3 F (36.8 C) 98.4 F (36.9 C)  SpO2: 96% 96%    Intake/Output Summary (Last 24 hours) at 01/02/2020 1145 Last data filed at 01/02/2020 0600 Gross per 24 hour  Intake 840 ml  Output 1500 ml  Net -660 ml   Filed Weights    12/30/19 0504 12/31/19 0647 01/02/20 0500  Weight: 42.2 kg 41.4 kg 41.4 kg    Exam: General: Awake, calm, no acute distress Pulmonary: Bilateral lung sounds are clear to auscultation posteriorly, stable on room air, no increased work of breathing at rest. Cardiac: Heart sounds S1-S2 without rubs murmurs thrills or gallops, no JVD, no peripheral edema.  Pulses regular with stable subtle tachycardia Abdomen: Soft, nontender nondistended.  Tolerating diet well. LBM 12/18 Extremities: Symmetrical, LUE wound base is clean with minimal fibrin and pink.  Dressing reapplied by nursing staff.   Assessment/Plan: Acute problems: Septic shock 2/2 MRSA bacteremia and associated TV endocarditis status post angio VAC/abnormal lumbar imaging -ID commended 42 days of IV daptomycin via PICC inserted 11/30 (last dose should be 01/16/20)-CK stable -ID following  -Repeat lumbar MRI revealed no evidence of infection in lumbar spine or other significant abnormality  Acute hypoxemic respiratory failure (multifactorial) 1 acute sepsis 2 septic emboli with sequela of small pleural effusion -Sepsis physiology and hypoxemia resolved -Continue oral doxycycline for sequela from septic pulmonary emboli-will DC at discharge -Continue supportive care for pleuritic chest discomfort -As of 12/14 has remained stable off of nasal cannula oxygen -Encourage use of incentive spirometry  Persistent tachycardia/tricuspid valve insufficiency -Evaluated by CVTS and currently not a surgical candidate for cardiac valve replacement giving active IVDA prior to current admission (although was appropriate for angiovac surgery) -Echocardiogram: mild systolic LV dysfunction -cardiology felt this was  physiologic response to hypoalbuminemia/malnutrition, deconditioning and anemia. -Cardiology recommended to suppress physiologic tachycardia with beta-blockers nor utilize diuretics prophylactically due to increased risk of harm    LBP -Musculoskeletal in etiology noting no significant abnormalities on recent MRI -Continue supportive care with ibuprofen prn and lidocaine patch  Severe protein calorie malnutrition with associated refeeding syndrome -Initial BMI 17 patient briefly required tube feedings which have now been discontinued. -BMI continues to trend upward on regular diet with supplementation as below Ensure feeding supplements Nutrition Status: Nutrition Problem: Severe Malnutrition Etiology: acute illness (MRSA endocarditis) Signs/Symptoms: energy intake < or equal to 50% for > or equal to 5 days,percent weight loss (8.5% weight loss in less than 2 months) Percent weight loss: 8.5 % Interventions: Ensure Enlive (each supplement provides 350kcal and 20 grams of protein),Magic cup,MVI Estimated body mass index is 17.27 kg/m as calculated from the following:   Height as of this encounter: 5\' 1"  (1.549 m).   Weight as of this encounter: 41.4 kg.  Skin wound secondary to IV infiltration/right groin wound/right neck incision     11/30/19                        12/20/19                         12/30/19  -ID also following along regarding  this wound.  No indication to change current management and no indication to consult surgery -Dark eschar fell off on 12/10 as evidenced by pictures above wound continues to improve -Continue gabapentin 300 mg TID  Incision (Closed) 12/05/19 Neck Right (Active)  Date First Assessed/Time First Assessed: 12/05/19 1017   Location: Neck  Location Orientation: Right    Assessments 12/05/2019 12:00 PM 12/29/2019  3:48 AM  Dressing Type Gauze (Comment) None  Dressing Clean;Dry;Intact --  Dressing Change Frequency PRN --  Site / Wound Assessment Clean;Dry --  Drainage Amount None --     No Linked orders to display     Incision (Closed) 12/05/19 Groin Right (Active)  Date First Assessed/Time First Assessed: 12/05/19 1017   Location: Groin  Location Orientation: Right     Assessments 12/05/2019 12:00 PM 12/28/2019  8:00 AM  Dressing Type Gauze (Comment) None  Dressing Dry;Intact;Clean --  Dressing Change Frequency PRN --  Site / Wound Assessment Clean;Dry --  Drainage Amount None --     No Linked orders to display     Wound / Incision (Open or Dehisced) 12/10/19 Non-pressure wound Arm Left;Posterior;Lateral infiltration/phlebitis (Active)  Date First Assessed/Time First Assessed: 12/10/19 1915   Wound Type: Non-pressure wound  Location: Arm  Location Orientation: Left;Posterior;Lateral  Wound Description (Comments): infiltration/phlebitis    Assessments 12/10/2019 11:45 PM 01/01/2020  9:30 PM  Dressing Type Thin film;Non adherent;Gauze (Comment) Non adherent  Dressing Changed New Changed  Dressing Status Intact Clean;Dry;Intact  Dressing Change Frequency PRN PRN  Site / Wound Assessment Painful;Red;Bleeding Painful;Red;Pink  Closure None --  Drainage Amount Minimal --  Drainage Description Serosanguineous --  Treatment Other (Comment) --     No Linked orders to display    Chronic IV drug abuse with opiates/recent withdrawal syndrome -12/13: Discussed with patient that plan is to decrease to discharge dose of methadone 40 mg daily by next Monday 12/20 -Pam has been decreased to 0.25 mg BID and dose will be decreased to once daily today  GAD -As noted above weaning and decreasing benzodiazepine -Discussed with patient's mother and as I have also noted patient has significant anxiety issues -12/10: Lexapro 10 mg HS initiated and plan to continue after discharge      Other problems: Anemia of chronic disease/recent sepsis related thrombocytopenia -Thrombocytopenia has resolved -Hemoglobin stable around 8 -With initiation of NSAIDs daily Protonix has been started  Hyponatremia -Current sodium stable at 133  Hep C antibody positive with persistent transaminitis -Currently has nonobstructive transaminitis that has improved with improvement  in nutrition -As of 12/3 albumin 2.0, AST 52, ALT normal at 42 and total bilirubin has been normal -HCVRNA quantitative pending   Data Reviewed: Basic Metabolic Panel: Recent Labs  Lab 12/28/19 0530  NA 136  K 4.0  CL 97*  CO2 31  GLUCOSE 124*  BUN 8  CREATININE 0.41*  CALCIUM 8.8*   Liver Function Tests: No results for input(s): AST, ALT, ALKPHOS, BILITOT, PROT, ALBUMIN in the last 168 hours. No results for input(s): LIPASE, AMYLASE in the last 168 hours. No results for input(s): AMMONIA in the last 168 hours. CBC: No results for input(s): WBC, NEUTROABS, HGB, HCT, MCV, PLT in the last 168 hours. Cardiac Enzymes: Recent Labs  Lab 12/29/19 0500  CKTOTAL 10*   BNP (last 3 results) Recent Labs    11/30/19 1809  BNP 583.0*    ProBNP (last 3 results) No results for input(s): PROBNP in the last 8760 hours.  CBG: Recent Labs  Lab 12/27/19 0207 12/27/19 0347 12/27/19 0503 12/27/19 0858 12/27/19 1145  GLUCAP 104* 68* 120* 99 78    No results found for this or any previous visit (from the past 240 hour(s)).   Studies: No results found.  Scheduled Meds: . chlorhexidine  15 mL Mouth Rinse BID  . Chlorhexidine Gluconate Cloth  6 each Topical Daily  . clonazepam  0.25 mg Oral Daily  . collagenase   Topical Daily  . diclofenac Sodium  4 g Topical QID  . doxycycline  100 mg Oral Q12H  . escitalopram  10 mg Oral QHS  . feeding supplement  237 mL Oral TID BM  . gabapentin  300 mg Oral TID  . lidocaine  2 patch Transdermal Q24H  . mouth rinse  15 mL Mouth Rinse q12n4p  . methadone  20 mg Oral Q12H  . multivitamin with minerals  1 tablet Oral Daily  . pantoprazole  40 mg Oral Daily  . sodium chloride flush  10-40 mL Intracatheter Q12H  . sodium chloride flush  10-40 mL Intracatheter Q12H   Continuous Infusions: . sodium chloride 10 mL/hr at 12/05/19 0617  . sodium chloride Stopped (12/19/19 2156)  . DAPTOmycin (CUBICIN)  IV 500 mg (01/01/20 2035)     Principal Problem:   MRSA infection Active Problems:   Septic shock (HCC)   Opioid use with withdrawal (HCC)   Endocarditis of tricuspid valve   Palliative care encounter   Protein-calorie malnutrition, severe   Consultants:  PCCM  Infectious disease  CVTS  Cardiology  Palliative medicine  Procedures:  10/22 echocardiogram  10/24 TEE  11/17 complete echocardiogram  11/22 intraoperative TEE/application of angio [facility]  11/24 core track  11/29 Limited echocardiogram  Antibiotics: Anti-infectives (From admission, onward)   Start     Dose/Rate Route Frequency Ordered Stop   12/13/19 1115  doxycycline (VIBRA-TABS) tablet 100 mg        100 mg Oral Every 12 hours 12/13/19 1027 01/16/20 2359   12/07/19 2000  DAPTOmycin (CUBICIN) 500 mg in sodium chloride 0.9 % IVPB        500 mg 220 mL/hr over 30 Minutes Intravenous Daily 12/06/19 1345 01/16/20 2359   12/02/19 1400  vancomycin (VANCOREADY) IVPB 500 mg/100 mL  Status:  Discontinued        500 mg 100 mL/hr over 60 Minutes Intravenous Every 8 hours 12/02/19 1127 12/02/19 1131   12/02/19 1400  vancomycin (VANCOREADY) IVPB 750 mg/150 mL  Status:  Discontinued        750 mg 150 mL/hr over 60 Minutes Intravenous Every 8 hours 12/02/19 1131 12/06/19 1339   12/01/19 1200  vancomycin (VANCOREADY) IVPB 500 mg/100 mL  Status:  Discontinued        500 mg 100 mL/hr over 60 Minutes Intravenous Every 24 hours 11/30/19 1454 12/01/19 0946   12/01/19 1200  vancomycin (VANCOCIN) IVPB 1000 mg/200 mL premix  Status:  Discontinued        1,000 mg 200 mL/hr over 60 Minutes Intravenous Every 24 hours 12/01/19 0946 12/02/19 1127   11/30/19 1800  piperacillin-tazobactam (ZOSYN) IVPB 3.375 g  Status:  Discontinued        3.375 g 12.5 mL/hr over 240 Minutes Intravenous Every 8 hours 11/30/19 1454 12/02/19 0234   11/30/19 1115  piperacillin-tazobactam (ZOSYN) IVPB 3.375 g        3.375 g 100 mL/hr over 30 Minutes Intravenous  Once 11/30/19  1110 11/30/19 1306   11/30/19 1115  vancomycin (VANCOCIN) IVPB  1000 mg/200 mL premix        1,000 mg 200 mL/hr over 60 Minutes Intravenous  Once 11/30/19 1110 11/30/19 1337       Time spent: 20 minutes    [name] ANP  [facility] Pager [phone].

## 2020-01-02 NOTE — Plan of Care (Signed)

## 2020-01-02 NOTE — Progress Notes (Signed)
Physical Therapy Treatment Patient Details Name: Amber [name] MRN: [id number] DOB: [birth year] Today's Date: 01/02/2020    History of Present Illness 29 yo admitted 11/17 with septic shock due to MRSA endocarditis with IVDU fentanyl and heroin day of admission. PMhx: admission 10/23-10/26 for endocarditis left AMA, IVDU, cellulitis, depression    PT Comments    Patient received in bed, Reports her left leg continues to hurt. Reports it has been hurting for a while. She is agreeable to PT session. Patient is mod independent with bed mobility, transfers with supervision. Ambulated 200 feet with supervision and RW. Antalgic gait pattern due to L LE pain. No lob with mobility. Patient will continue to benefit from skilled PT while here to improve functional independence and strength.       Follow Up Recommendations  Outpatient PT;Other (comment) (if hip pain continues)     Equipment Recommendations  Rolling walker with 5" wheels;3in1 (PT)    Recommendations for Other Services       Precautions / Restrictions Precautions Precaution Comments: low fall Restrictions Weight Bearing Restrictions: No    Mobility  Bed Mobility Overal bed mobility: Modified Independent Bed Mobility: Supine to Sit;Sit to Supine     Supine to sit: Modified independent (Device/Increase time) Sit to supine: Modified independent (Device/Increase time)   General bed mobility comments: patient is getting up to [facility] independently required no physical assist with bed mobility. No lines this visit to manage.  Transfers Overall transfer level: Needs assistance Equipment used: Rolling walker (2 wheeled) Transfers: Sit to/from Stand Sit to Stand: Supervision         General transfer comment: supervision only  Ambulation/Gait Ambulation/Gait assistance: Supervision Gait Distance (Feet): 200 Feet Assistive device: Rolling walker (2 wheeled) Gait Pattern/deviations: Step-through pattern;Decreased step  length - right;Decreased step length - left;Decreased weight shift to left Gait velocity: slowed   General Gait Details: supervision for ambulation. No lob, initially ambulating on toes of left foot due to reported pain. With increased distance she is able to put foot flat.   Stairs             Wheelchair Mobility    Modified Rankin (Stroke Patients Only)       Balance Overall balance assessment: Modified Independent Sitting-balance support: Feet supported Sitting balance-[name]: Normal     Standing balance support: Bilateral upper extremity supported;During functional activity Standing balance-[name]: Good Standing balance comment: no lob with RW. Painful L LE                            Cognition Arousal/Alertness: Awake/alert Behavior During Therapy: WFL for tasks assessed/performed Overall Cognitive Status: Within Functional Limits for tasks assessed                                        Exercises      General Comments        Pertinent Vitals/Pain Pain Assessment: Faces Faces Pain Scale: Hurts little more Pain Location: L hip Pain Descriptors / Indicators: Aching;Guarding;Discomfort Pain Intervention(s): Limited activity within patient's tolerance;Monitored during session;Repositioned    Home Living                      Prior Function            PT Goals (current goals can now be found in the  care plan section) Acute Rehab PT Goals Patient Stated Goal: to decrease pain in left hip PT Goal Formulation: With patient Time For Goal Achievement: 01/13/20 Potential to Achieve Goals: Fair Progress towards PT goals: Progressing toward goals    Frequency    Min 3X/week      PT Plan Current plan remains appropriate    Co-evaluation              AM-PAC PT "6 Clicks" Mobility   Outcome Measure  Help needed turning from your back to your side while in a flat bed without using bedrails?:  None Help needed moving from lying on your back to sitting on the side of a flat bed without using bedrails?: None Help needed moving to and from a bed to a chair (including a wheelchair)?: None Help needed standing up from a chair using your arms (e.g., wheelchair or bedside chair)?: None Help needed to walk in hospital room?: A Little Help needed climbing 3-5 steps with a railing? : A Little 6 Click Score: 22    End of Session   Activity Tolerance: Patient limited by pain Patient left: in bed;with call bell/phone within reach Nurse Communication: Mobility status PT Visit Diagnosis: Difficulty in walking, not elsewhere classified (R26.2);Pain Pain - Right/Left: Left Pain - part of body: Hip     Time: 8127-5170 PT Time Calculation (min) (ACUTE ONLY): 14 min  Charges:  $Gait Training: 8-22 mins                     Melisa Donofrio, PT, GCS 01/02/20,2:15 PM

## 2020-01-03 ENCOUNTER — Inpatient Hospital Stay (HOSPITAL_COMMUNITY): Payer: Self-pay

## 2020-01-03 DIAGNOSIS — J9383 Other pneumothorax: Secondary | ICD-10-CM

## 2020-01-03 IMAGING — DX DG CHEST 1V PORT
1 series · 1 of 1 positions shown · non-contrast
Comparison: [DATE]

CLINICAL DATA: Pleuritic chest pain.  History of drug overdose

EXAM:
PORTABLE CHEST 1 VIEW

[chest]
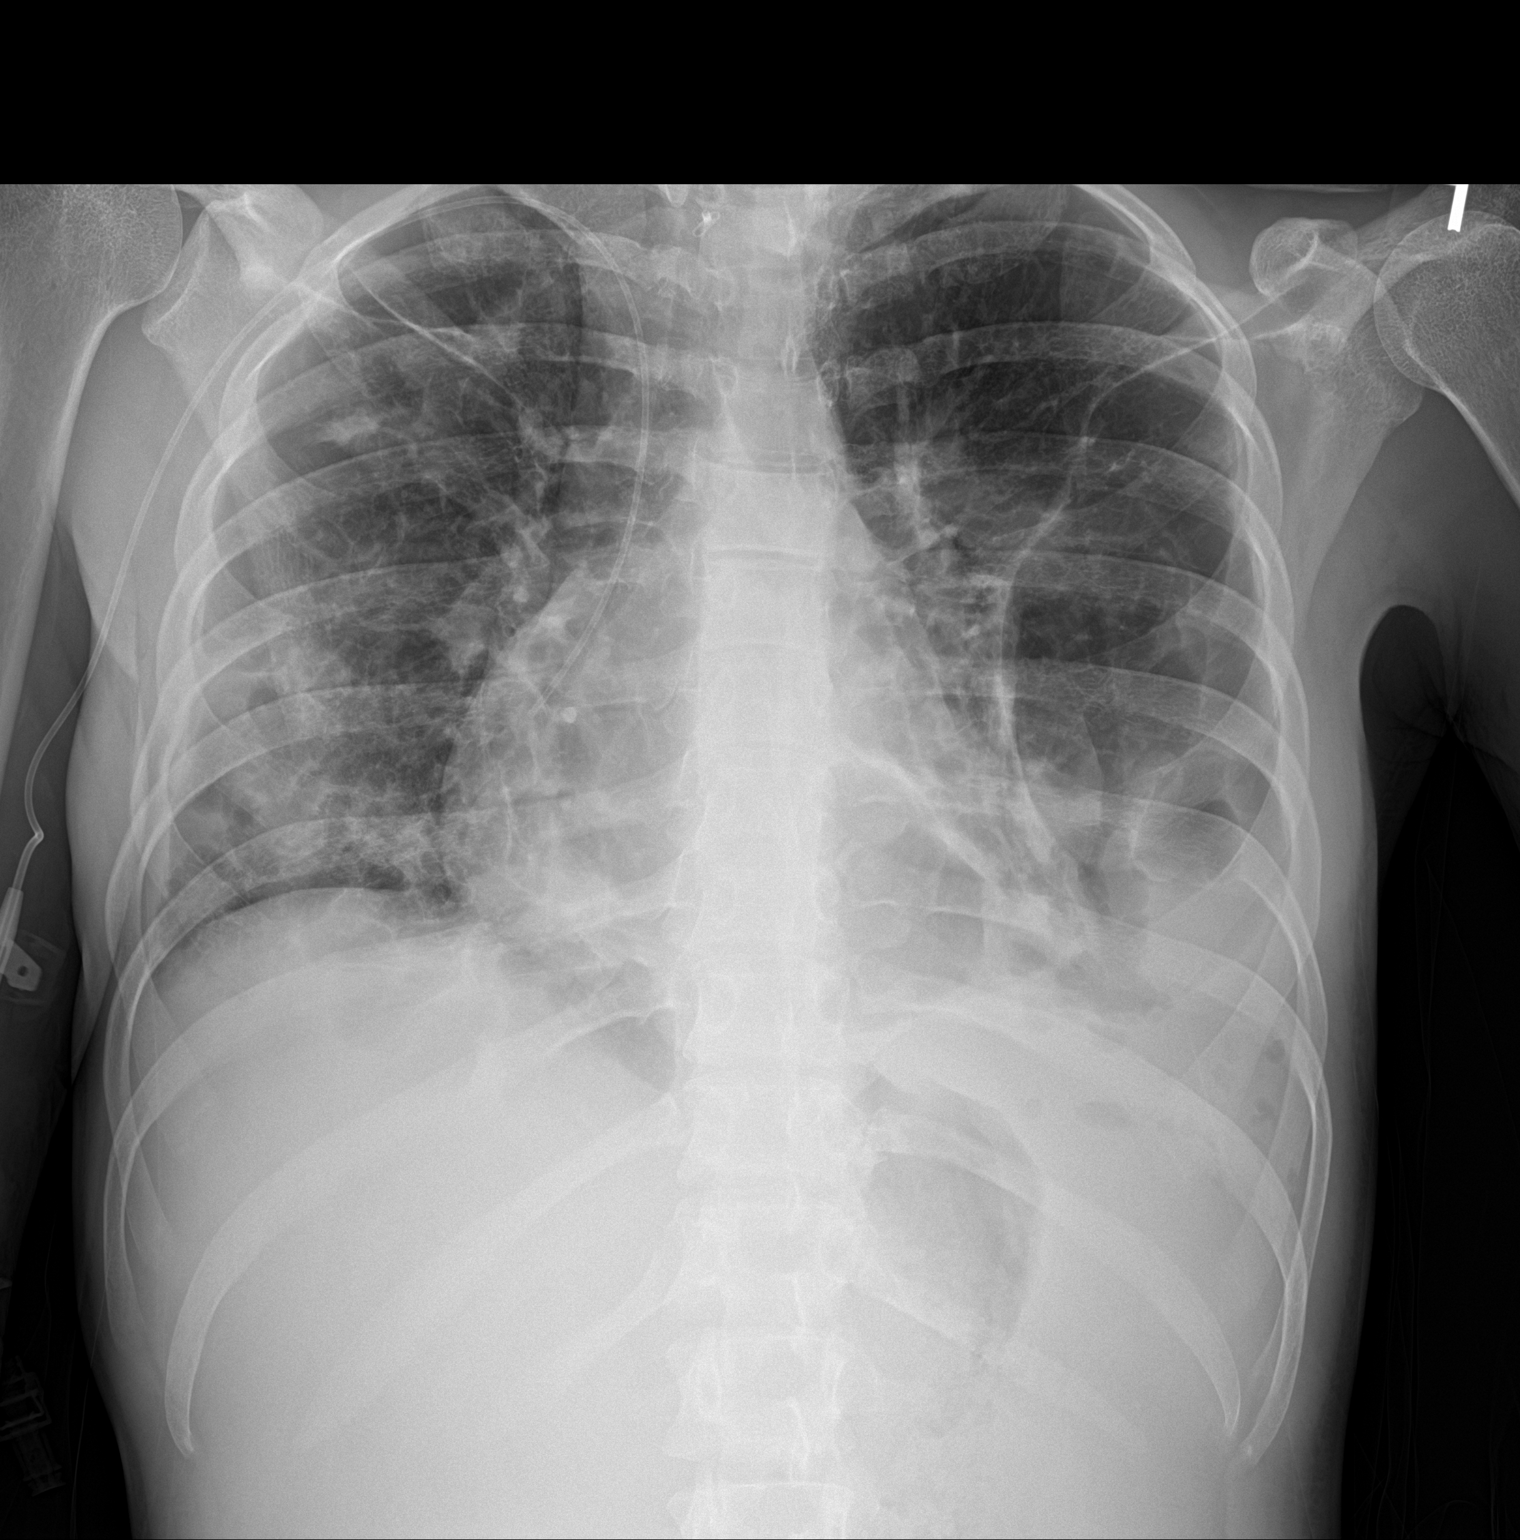

[1 of 1 positions shown; findings below may reference images not displayed]

FINDINGS: Right PICC with tip at the upper right atrium. Patchy pulmonary
opacity with cavitary features as previously demonstrated. In place
of loculated pleural fluid at the lateral left chest there is now an
ovoid lucent area. Normal heart size. Probable splenomegaly
IMPRESSION: Lucent area in the lateral left chest where there was previously
loculated pleural fluid; suspect loculated pneumothorax. Consider
chest CT to evaluate for bronchopleural fistula.

Bilateral cavitary pneumonia which has likely improved.

## 2020-01-03 IMAGING — CT CT CHEST W/ CM
2 of 3 series · 15 of 36 positions shown, 18 images · IV contrast (omnipaque)
Comparison: Chest radiograph dated [DATE]. CT chest dated
[DATE]

CLINICAL DATA: Suspected loculated pneumothorax, evaluate for
bronchopleural fistula

EXAM:
CT CHEST WITH CONTRAST
TECHNIQUE: Multidetector CT imaging of the chest was performed during
intravenous contrast administration.
CONTRAST:  75mL OMNIPAQUE IOHEXOL 300 MG/ML  SOLN

[Series 3: chest with 2mm st · axial · 0.62mm/px · z∈[+1301,+1541]mm · 12 of 142 slices shown, 15 images]
[im 11/142  mediastinal]
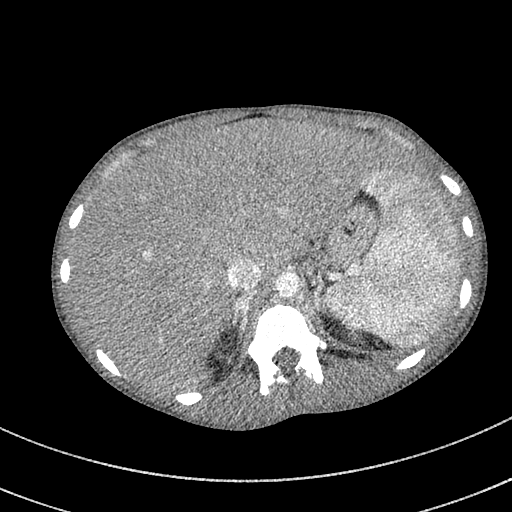
[im 11/142  lung]
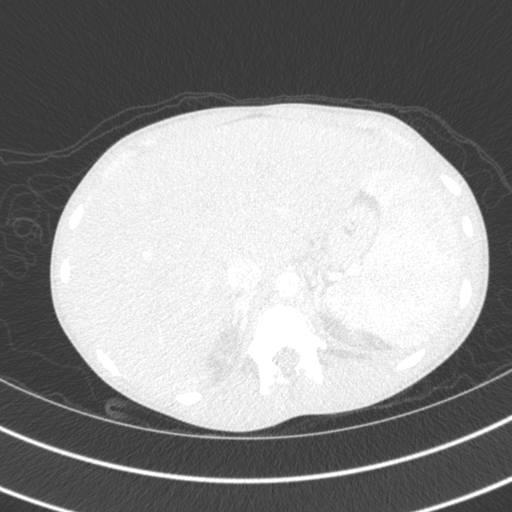
[im 21/142  lung]
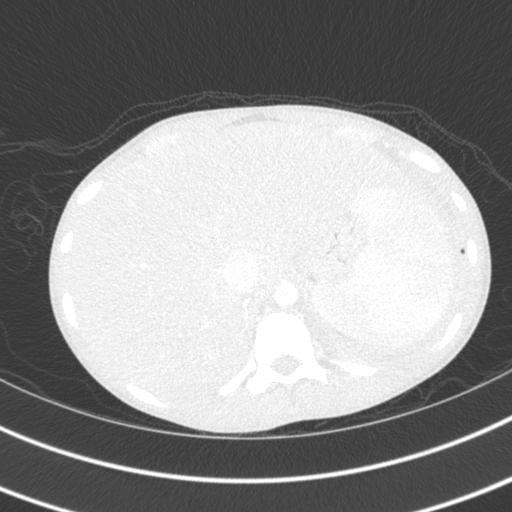
[im 32/142  lung]
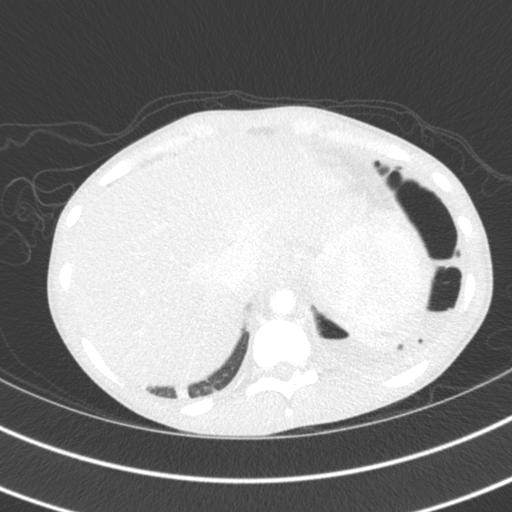
[im 42/142  lung]
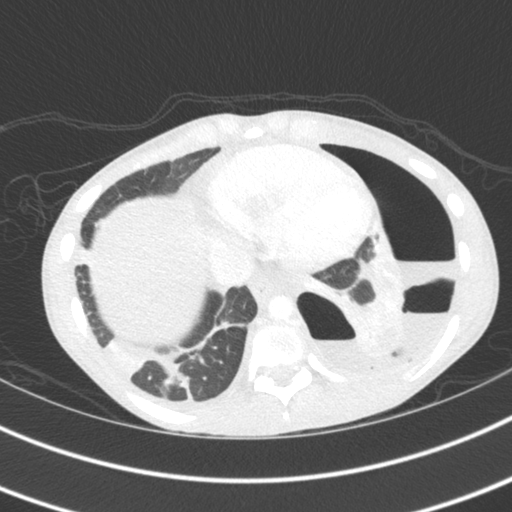
[im 53/142  mediastinal]
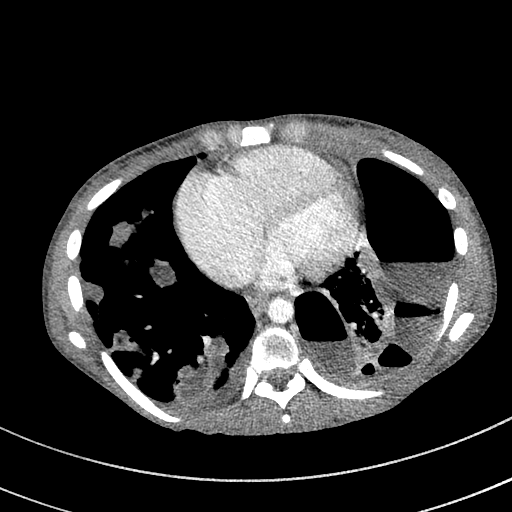
[im 53/142  lung]
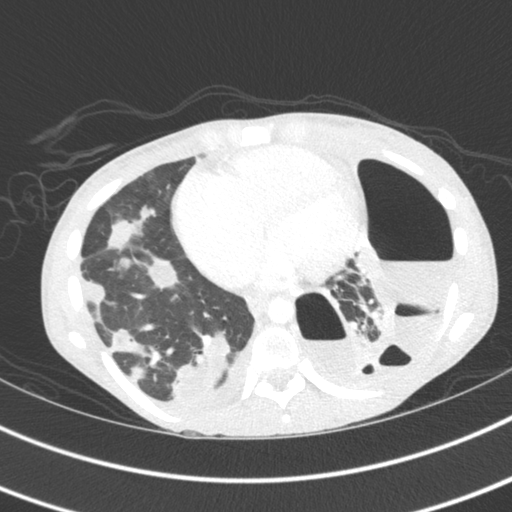
[im 63/142  lung]
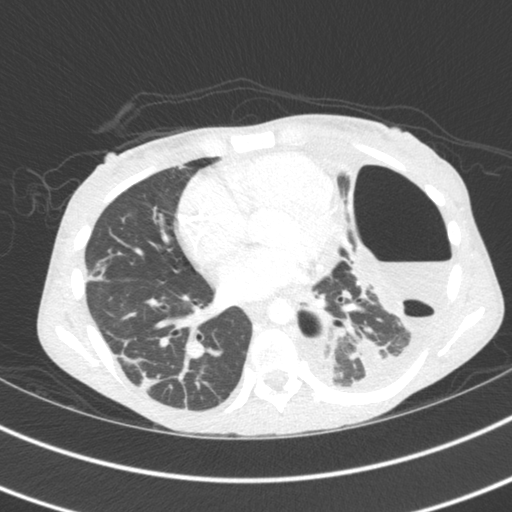
[im 79/142  lung]
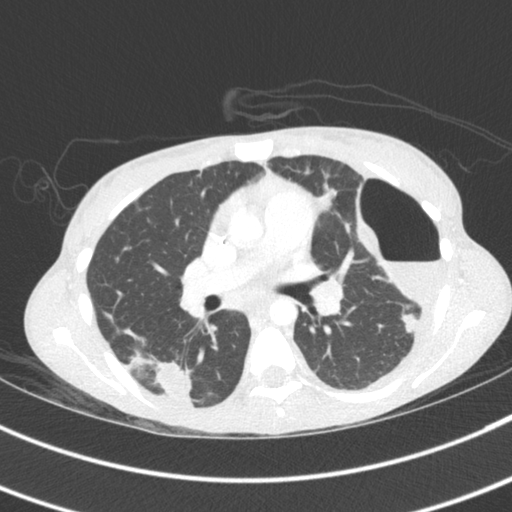
[im 89/142  lung]
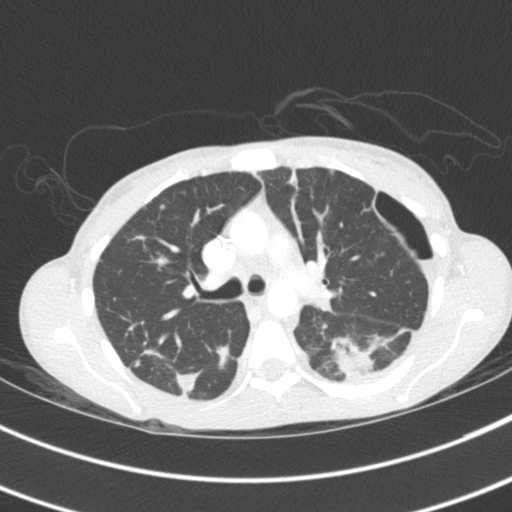
[im 100/142  mediastinal]
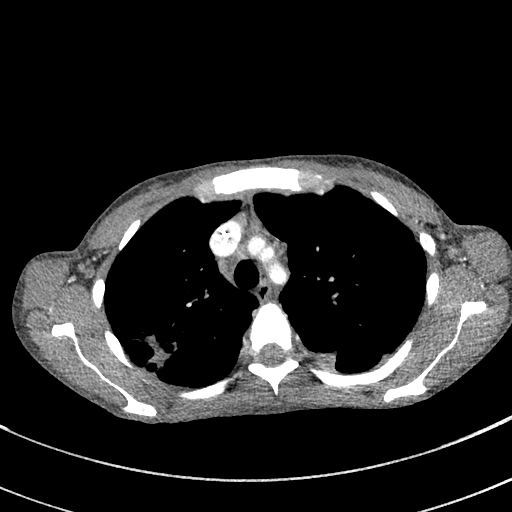
[im 100/142  lung]
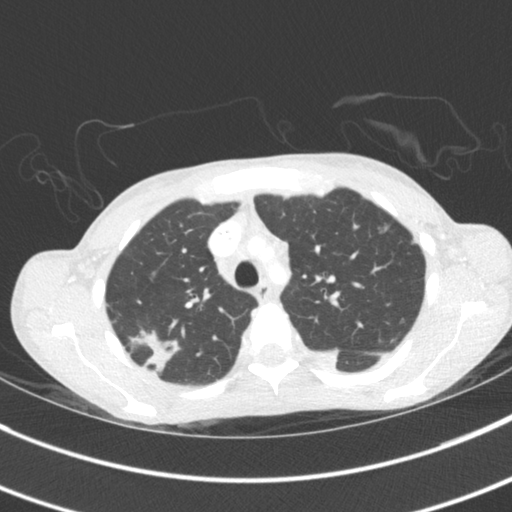
[im 110/142  lung]
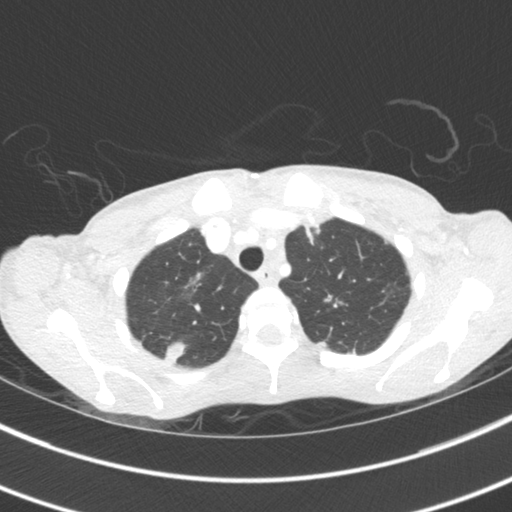
[im 121/142  lung]
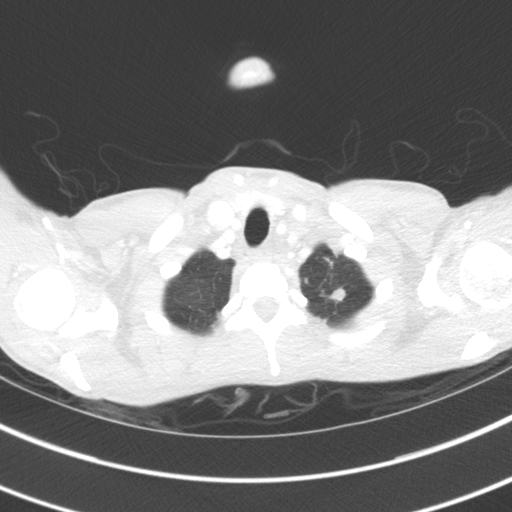
[im 131/142  lung]
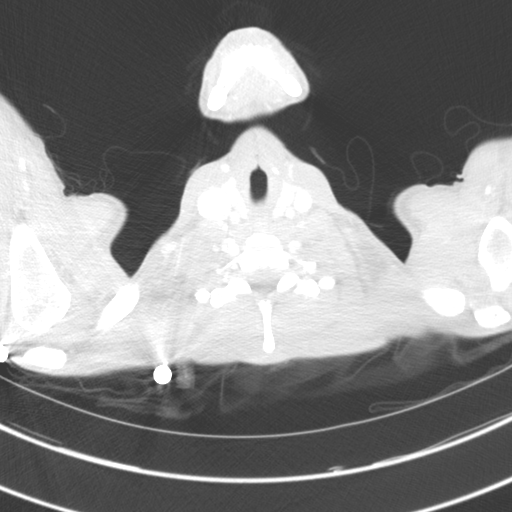

[Series 6: chest with 2mm st cor · coronal · 0.56mm/px · 3 of 122 slices shown]
[im 25/122  lung]
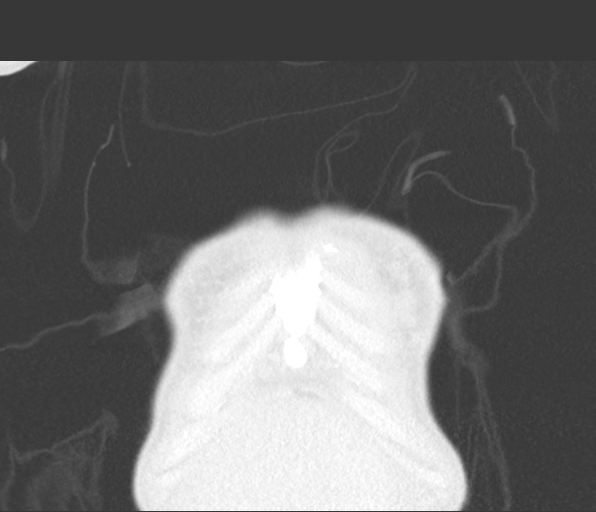
[im 49/122  lung]
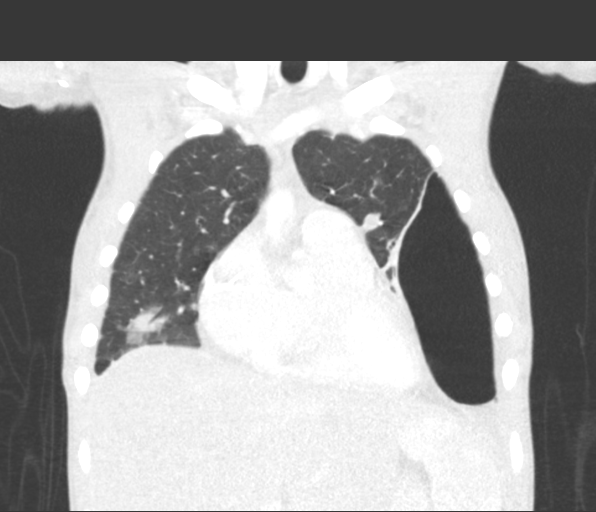
[im 73/122  lung]
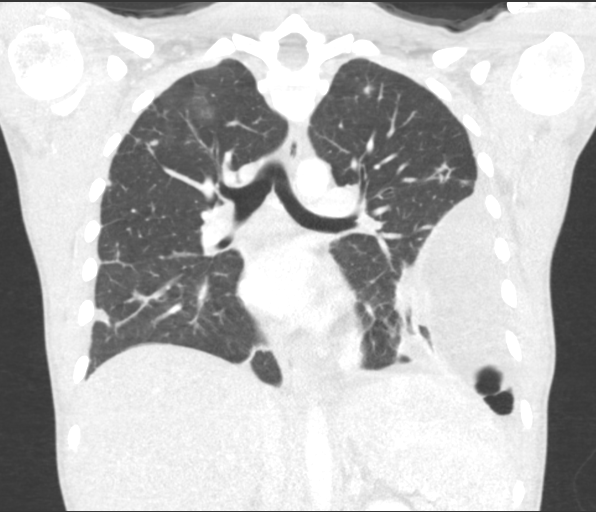

[15 of 36 positions shown; findings below may reference images not displayed]

FINDINGS: Cardiovascular: The heart is normal in size. Trace inferior
pericardial fluid.

No evidence of thoracic aortic aneurysm.

Right arm PICC terminates in the upper right atrium.

Mediastinum/Nodes: Small mediastinal lymph nodes which do not meet
pathologic CT size criteria.

Visualized thyroid is unremarkable.

Lungs/Pleura: Multiple patchy/nodular opacities in the lungs
bilaterally, some of which are cavitary, improved when compared to
the prior. For example, a 17 x 11 mm nodule in the posterior right
upper lobe (series 4/image 35), previously measured 22 x 16 mm. A 19
x 9 mm nodule in the medial left upper lobe (series 4/image 62)
previously measured 20 x 13 mm. This appearance continues to favor
multifocal pneumonia on the basis of septic emboli.

Moderate gas and fluid collection in the left pleural space,
previously reflecting pleural fluid. Suspected direct communication
via a dilated terminal bronchiole in the left anterior lingula
(series 4/images 70-71), reflecting a bronchopleural fistula.

Mild patchy opacities in the left lower lobe, atelectasis versus
pneumonia. Trace right pleural effusion.

Upper Abdomen: Visualized upper abdomen is grossly unremarkable.

Musculoskeletal: Visualized osseous structures are within normal
limits.
IMPRESSION: Improving multifocal pneumonia, likely on the basis of septic
emboli.

Moderate left hydropneumothorax on the basis of a bronchopleural
fistula in the left anterior lingula.

Trace right pleural effusion.

## 2020-01-03 MED ORDER — IOHEXOL 300 MG/ML  SOLN
75.0000 mL | Freq: Once | INTRAMUSCULAR | Status: AC | PRN
  Administered 2020-01-03: 75 mL via INTRAVENOUS

## 2020-01-03 NOTE — Progress Notes (Signed)
°   01/03/20 1933  Assess: MEWS Score  Temp 99.5 F (37.5 C)  BP 123/69  Pulse Rate (!) 113  Resp 18  Level of Consciousness Alert  SpO2 98 %  O2 Device Room Air  Patient Activity (if Appropriate) In bed  Assess: MEWS Score  MEWS Temp 0  MEWS Systolic 0  MEWS Pulse 2  MEWS RR 0  MEWS LOC 0  MEWS Score 2  MEWS Score Color Yellow  Assess: if the MEWS score is Yellow or Red  Were vital signs taken at a resting state? Yes  Focused Assessment No change from prior assessment  Early Detection of Sepsis Score *See Row Information* Low  MEWS guidelines implemented *See Row Information* Yes  Treat  Pain Scale 0-10  Pain Score 3  Pain Type Chronic pain;Acute pain  Pain Location Arm  Pain Orientation Left  Pain Descriptors / Indicators Aching  Pain Frequency Intermittent  Pain Onset Gradual  Patients Stated Pain Goal 0  Pain Intervention(s) Medication (See eMAR)  Multiple Pain Sites No  Take Vital Signs  Increase Vital Sign Frequency  Yellow: Q 2hr X 2 then Q 4hr X 2, if remains yellow, continue Q 4hrs  Escalate  MEWS: Escalate Yellow: discuss with charge nurse/RN and consider discussing with provider and RRT  Notify: Charge Nurse/RN  Name of Charge Nurse/RN Notified Carla,RN  Date Charge Nurse/RN Notified 01/03/20  Time Charge Nurse/RN Notified 1940  Notify: Provider  Provider Name/Title M.Denny,NP  Date Provider Notified 01/03/20  Time Provider Notified 1935  Notification Type Page  Response No new orders

## 2020-01-03 NOTE — Progress Notes (Signed)
     [address]       [name] [id number]             [phone]       Amber [name] is well-known to our service.  She has undergone angiographic debridement of her tricuspid valve for endocarditis this hospitalization.  I reviewed her CT scan, and she does have a loculated hydropneumothorax on the left side.  I would recommend that an image guided chest tube be placed for management of the hydropneumothorax.  She continues to appear very deconditioned and frail, and would only reserve surgery if pigtail catheter is not successful in treating the loculated collection.  We will continue to follow peripherally.  Datron Brakebill [name]

## 2020-01-03 NOTE — Progress Notes (Signed)
Nutrition Follow-up  DOCUMENTATION CODES:   Severe malnutrition in context of acute illness/injury,Underweight  INTERVENTION:   -Continue Ensure Enlive po TID, each supplement provides 350 kcal and 20 grams of protein -Continue Magic cup TID with meals, each supplement provides 290 kcal and 9 grams of protein -Continue MVI with minerals daily  NUTRITION DIAGNOSIS:   Severe Malnutrition related to acute illness (MRSA endocarditis) as evidenced by energy intake < or equal to 50% for > or equal to 5 days,percent weight loss (8.5% weight loss in less than 2 months).  Ongoing  GOAL:   Patient will meet greater than or equal to 90% of their needs  Progressing   MONITOR:   PO intake,Supplement acceptance,Labs,Weight trends,Skin  REASON FOR ASSESSMENT:   Consult Enteral/tube feeding initiation and management (nocturnal TF)  ASSESSMENT:   29 yo female admitted with septic shock, AKI. PMH includes tricuspid MRSA endocarditis, IVDU.  Reviewed I/O's: -2.5 L x 24 hours and -2.4 L since 12/20/19  UOP: 3.4 L x 24 hours  Attempted to speak with pt via call to hospital room phone, however, unable to reach.   Per MD notes, plan to complete IV antibiotics on 01/16/19.  Pt with improved oral intake. Noted meal completions 75-100% of meals. Pt has been accepting the past several doses of Ensure Enlive supplements.  Labs reviewed: CBGS: 78-99.   Diet Order:   Diet Order            Diet regular Room service appropriate? Yes; Fluid consistency: Thin  Diet effective now                 EDUCATION NEEDS:   Education needs have been addressed  Skin:  Skin Assessment: Skin Integrity Issues: Skin Integrity Issues:: Incisions,Other (Comment) Incisions: right neck, right groin Other: infiltration/phlebitis to left arm  Last BM:  01/02/20  Height:   Ht Readings from Last 1 Encounters:  12/29/19 5\' 1"  (1.549 m)    Weight:   Wt Readings from Last 1 Encounters:  01/02/20 41.4  kg    Ideal Body Weight:  50 kg  BMI:  Body mass index is 17.27 kg/m.  Estimated Nutritional Needs:   Kcal:  1800-2000  Protein:  80-95 gm  Fluid:  >/= 1.5 L    01/04/20, RD, LDN, CDCES Registered Dietitian II Certified Diabetes Care and Education Specialist Please refer to [facility] for RD and/or RD on-call/weekend/after hours pager

## 2020-01-03 NOTE — Progress Notes (Signed)
Occupational Therapy Treatment Patient Details Name: Amber [name] MRN: [id number] DOB: 1990-05-25 Today's Date: 01/03/2020    History of present illness 29 yo admitted 11/17 with septic shock due to MRSA endocarditis with IVDU fentanyl and heroin day of admission. PMhx: admission 10/23-10/26 for endocarditis left AMA, IVDU, cellulitis, depression   OT comments  Pt making progress with functional goals. Pt c/o L hip pain, however is able to sit EOB, stand and transfer to [facility] for toileting tasks min guard A - Sup. OT will continue to follow acutely to maximize level of function and safety  Follow Up Recommendations  No OT follow up    Equipment Recommendations  None recommended by OT    Recommendations for Other Services      Precautions / Restrictions Precautions Precautions: Fall Restrictions Weight Bearing Restrictions: No       Mobility Bed Mobility Overal bed mobility: Modified Independent Bed Mobility: Supine to Sit;Sit to Supine     Supine to sit: Modified independent (Device/Increase time) Sit to supine: Modified independent (Device/Increase time)   General bed mobility comments: patient is getting up to [facility] independently required no physical assist with bed mobility. No lines this visit to manage.  Transfers Overall transfer level: Needs assistance Equipment used: Rolling walker (2 wheeled);1 person hand held assist Transfers: Sit to/from Stand Sit to Stand: Supervision Stand pivot transfers: Supervision            Balance Overall balance assessment: Modified Independent Sitting-balance support: Feet supported Sitting balance-[name]: Good     Standing balance support: Bilateral upper extremity supported;During functional activity Standing balance-[name]: Good                             ADL either performed or assessed with clinical judgement   ADL Overall ADL's : Needs assistance/impaired     Grooming: Wash/dry  face;Wash/dry hands;Min Production designer, theatre/television/film: RW;Stand-pivot;Min guard;Supervision/safety;Ambulation;BSC   Toileting- Architect and Hygiene: Supervision/safety;Sit to/from stand       Functional mobility during ADLs: Min guard;Supervision/safety       Vision Patient Visual Report: No change from baseline     Perception     Praxis      Cognition Arousal/Alertness: Awake/alert Behavior During Therapy: WFL for tasks assessed/performed Overall Cognitive Status: Within Functional Limits for tasks assessed                                          Exercises     Shoulder Instructions       General Comments      Pertinent Vitals/ Pain       Pain Assessment: 0-10 Pain Score: 5  Pain Location: L hip Pain Descriptors / Indicators: Aching;Guarding;Discomfort Pain Intervention(s): Monitored during session;Limited activity within patient's tolerance;Repositioned  Home Living                                          Prior Functioning/Environment              Frequency  Min 2X/week        Progress Toward Goals  OT Goals(current goals can now be found  in the care plan section)  Progress towards OT goals: Progressing toward goals  Acute Rehab OT Goals Patient Stated Goal: less pain in L hip  Plan Discharge plan remains appropriate    Co-evaluation                 AM-PAC OT "6 Clicks" Daily Activity     Outcome Measure   Help from another person eating meals?: None Help from another person taking care of personal grooming?: A Little Help from another person toileting, which includes using toliet, bedpan, or urinal?: A Little Help from another person bathing (including washing, rinsing, drying)?: A Little Help from another person to put on and taking off regular upper body clothing?: None Help from another person to put on and taking off regular lower body clothing?: A  Little 6 Click Score: 20    End of Session Equipment Utilized During Treatment: Rolling walker;Other (comment) (BSC)  OT Visit Diagnosis: Unsteadiness on feet (R26.81);Other abnormalities of gait and mobility (R26.89);Muscle weakness (generalized) (M62.81)   Activity Tolerance Patient limited by pain   Patient Left in bed;with call bell/phone within reach   Nurse Communication          Time: 1324-4010 OT Time Calculation (min): 16 min  Charges: OT General Charges $OT Visit: 1 Visit OT Treatments $Self Care/Home Management : 8-22 mins     Galen Manila 01/03/2020, 2:56 PM

## 2020-01-03 NOTE — Progress Notes (Addendum)
[facility] PROGRESS NOTE  Amber [name] JSE:[id number] DOB: 1990-04-18 DOA: 11/30/2019 PCP: Patient, No Pcp Per     12/15  Status: Remains inpatient appropriate because:Ongoing active pain requiring inpatient pain management, Ongoing diagnostic testing needed not appropriate for outpatient work up, Unsafe d/c plan, IV treatments appropriate due to intensity of illness or inability to take PO and Inpatient level of care appropriate due to severity of illness   Dispo: The patient is from: Home              Anticipated d/c is to: Home              Anticipated d/c date is: > 3 days (01/16/20)              Patient currently is not medically stable to d/c.  Patient's primary reason for remaining in the hospital is to complete 6 weeks of IV and oral antibiotics for her endocarditis with septic emboli.  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  Code Status: Full Family Communication: Patient; mother at bedside on 12/14  DVT prophylaxis: SCDs 2/2 recent thrombocytopenia due to sepsis Vaccination status: Has not been vaccinated against Covid  Foley catheter: No  HPI: 29 year old female patient with known IV drug abuse with known tricuspid MRSA endocarditis who signed out AMA x2 prior to this admission.  She presented with progressive infection.  On 10/21 through 10/23 she was admitted with fever with blood cultures positive for MRSA.  CT of the chest demonstrated septic emboli and edematous L4-5 facet joint (? Evolving osteomyelitis) as well as tricuspid vegetation on CT.  She left AMA.  She returned 2 hours later on 10/23 with severe back pain.  Underwent TEE that demonstrated a 1 cm tricuspid valve mass.  She was evaluated by CT surgery who recommended medical management.  Again she left AMA on 10/25.  Patient was staying with friends but presented back to the hospital due to  increasing shortness of breath, weakness, fatigue malaise and nausea.  She arrived in the parking lot obtunded and was found to have multiple needles in her car on 11/17.  In the ER she was hypotensive and started on pressors.  Hemoglobin was low and she was given a blood transfusion.  She was also started on empiric IV vancomycin for known MRSA infection  and was admitted to the critical care service.  She eventually was taken to the OR for angio VAC during this admission.  She has subsequently transitioned out of ICU to stepdown unit.  She has had an IV infiltration and has a superficial wound on her left forearm. ID continues to follow regarding the endocarditis and recommends a total of 6 weeks therapy be daptomycin.  She has continued to have hypoxemia and productive cough and on 11/30 ID added doxycycline.  She is reporting pleuritic chest pain as well.  She is also very malnourished and was started on tube feedings and developed mild electrolyte disturbances from refeeding syndrome which have resolved.  She has low-grade resting tachycardia with no significant abnormalities on echo and cardiology previously did not suspect cardiac etiology to her tachycardia.  Hepatitis C antibody has been positive; she is HIV negative.  She has had persistent elevation in her LFTs.  She is currently on methadone 20 mg 3 times a day but will need to be on no more than 40 mg per 24 hours at time of  discharge to be eligible for outpatient methadone treatment.  Subjective: Awake. Reporting very sharp pleuritic lateral chest pain radiating to axilla with deep breathing. Dyspnea on exertion or shortness of breath.  Objective: Vitals:   01/02/20 2240 01/03/20 0514  BP: (!) 97/57 102/63  Pulse: (!) 102 (!) 105  Resp: 18 17  Temp: 99 F (37.2 C) 98.8 F (37.1 C)  SpO2: 95% 95%    Intake/Output Summary (Last 24 hours) at 01/03/2020 1105 Last data filed at 01/03/2020 0904 Gross per 24 hour  Intake 850 ml  Output  1900 ml  Net -1050 ml   Filed Weights   12/30/19 0504 12/31/19 0647 01/02/20 0500  Weight: 42.2 kg 41.4 kg 41.4 kg    Exam: General: Awake, no acute distress other than pleuritic chest discomfort, calm Pulmonary: Lung sounds are clear to auscultation, slightly tender left lateral chest, room air Cardiac: S1 S2 without murmurs, no JVD, no peripheral edema, stable subtle tachycardia Abdomen: Soft, nontender nondistended.  Tolerating diet well. LBM 12/20 Extremities: Symmetrical, LUE wound base is clean with minimal fibrin and pink.  Dressing reapplied by nursing staff.   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 Cliffton Asters will see patient later today.  He is currently in the operating room. -Patient is stable on room air but continues to have pleuritic chest discomfort -Patient and mother updated on diagnosis and possible treatment plan  Acute hypoxemic respiratory failure (multifactorial) 1 acute sepsis 2 septic emboli with sequela of small pleural effusion -Sepsis physiology and hypoxemia resolved -Continue oral doxycycline for sequela from septic pulmonary emboli-will DC at discharge -Continue supportive care for pleuritic chest discomfort; 12/21 due to reemergence of discomfort chest x-ray completed and does reveal loculated pleural fluid possibly loculated  pneumothorax-augmentation made to obtain CT of the chest to evaluate for bronchopleural fistula -As of 12/14 has remained stable off of nasal cannula oxygen -Encourage use of incentive spirometry  Persistent tachycardia/tricuspid valve insufficiency -Evaluated by CVTS and currently not a surgical candidate for cardiac valve replacement giving active IVDA prior to current admission (although was appropriate for angiovac surgery) -Echocardiogram: mild systolic LV dysfunction -cardiology felt this was  physiologic response to hypoalbuminemia/malnutrition, deconditioning and anemia. -Cardiology recommended to suppress physiologic tachycardia with beta-blockers nor utilize diuretics prophylactically due to increased risk of harm   LBP -Musculoskeletal in etiology noting no significant abnormalities on recent MRI -Continue supportive care with ibuprofen prn and lidocaine patch  Severe protein calorie malnutrition with associated refeeding syndrome -Initial BMI 17 patient briefly required tube feedings which have now been discontinued. -BMI continues to trend upward on regular diet with supplementation as below Ensure feeding supplements Nutrition Problem: Severe Malnutrition Etiology: acute illness (MRSA endocarditis) Signs/Symptoms: energy intake < or equal to 50% for > or equal to 5 days,percent weight loss (8.5% weight loss in less than 2 months) Percent weight loss: 8.5 % Interventions: Ensure Enlive (each supplement provides 350kcal and 20 grams of protein),Magic cup,MVI Estimated body mass index is 17.27 kg/m as calculated from the following:  Height as of this encounter:  (1.549 m).   Weight as of this encounter: 41.4 kg.  Skin wound secondary to IV infiltration/right groin wound/right neck incision     11/30/19                        12/20/19                         12/30/19  -ID also following along regarding this wound.  No indication to change current management and no  indication to consult surgery -Dark eschar fell off on 12/10 as evidenced by pictures above wound continues to improve -Continue gabapentin 300 mg TID  Wound / Incision (Open or Dehisced) 12/10/19 Non-pressure wound Arm Left;Posterior;Lateral infiltration/phlebitis (Active)  Date First Assessed/Time First Assessed: 12/10/19 1915   Wound Type: Non-pressure wound  Location: Arm  Location Orientation: Left;Posterior;Lateral  Wound Description (Comments): infiltration/phlebitis    Assessments 12/10/2019 11:45 PM 01/02/2020  9:00 PM  Dressing Type Thin film;Non adherent;Gauze (Comment) Gauze (Comment)  Dressing Changed New Reinforced  Dressing Status Intact Clean;Dry;Intact  Dressing Change Frequency PRN Daily  Site / Wound Assessment Painful;Red;Bleeding Clean;Dry  Closure None --  Drainage Amount Minimal --  Drainage Description Serosanguineous --  Treatment Other (Comment) --     No Linked orders to display    Chronic IV drug abuse with opiates/recent withdrawal syndrome -12/13: Discussed with patient that plan is to decrease to discharge dose of methadone 40 mg daily by next Monday 12/20 -Pam has been decreased to 0.25 mg BID and dose will be decreased to once daily today  GAD -As noted above weaning and decreasing benzodiazepine -Discussed with patient's mother and as I have also noted patient has significant anxiety issues -12/10: Lexapro 10 mg HS initiated and plan to continue after discharge      Other problems: Anemia of chronic disease/recent sepsis related thrombocytopenia -Thrombocytopenia has resolved -Hemoglobin stable around 8 -With initiation of NSAIDs daily Protonix has been started  Hyponatremia -Current sodium stable at 133  Hep C antibody positive with persistent transaminitis -Currently has nonobstructive transaminitis that has improved with improvement in nutrition -As of 12/3 albumin 2.0, AST 52, ALT normal at 42 and total bilirubin has been  normal -HCVRNA quantitative pending   Data Reviewed: Basic Metabolic Panel: Recent Labs  Lab 12/28/19 0530  NA 136  K 4.0  CL 97*  CO2 31  GLUCOSE 124*  BUN 8  CREATININE 0.41*  CALCIUM 8.8*   Liver Function Tests: No results for input(s): AST, ALT, ALKPHOS, BILITOT, PROT, ALBUMIN in the last 168 hours. No results for input(s): LIPASE, AMYLASE in the last 168 hours. No results for input(s): AMMONIA in the last 168 hours. CBC: No results for input(s): WBC, NEUTROABS, HGB, HCT, MCV, PLT in the last 168 hours. Cardiac Enzymes: Recent Labs  Lab 12/29/19 0500  CKTOTAL 10*   BNP (last 3 results) Recent Labs    11/30/19 1809  BNP 583.0*    ProBNP (last 3 results) No results for input(s): PROBNP in the last 8760 hours.  CBG: Recent Labs  Lab 12/27/19 1145  GLUCAP 78    No results found for this or any previous visit (from the past 240 hour(s)).   Studies: DG CHEST PORT 1 VIEW  Result Date: 01/03/2020 CLINICAL DATA:  Pleuritic chest pain.  History of drug overdose EXAM: PORTABLE CHEST 1 VIEW COMPARISON:  12/11/2019 FINDINGS: Right PICC with tip  at the upper right atrium. Patchy pulmonary opacity with cavitary features as previously demonstrated. In place of loculated pleural fluid at the lateral left chest there is now an ovoid lucent area. Normal heart size. Probable splenomegaly IMPRESSION: Lucent area in the lateral left chest where there was previously loculated pleural fluid; suspect loculated pneumothorax. Consider chest CT to evaluate for bronchopleural fistula. Bilateral cavitary pneumonia which has likely improved. Electronically Signed   By: Marnee Spring M.D.   On: 01/03/2020 08:52    Scheduled Meds: . chlorhexidine  15 mL Mouth Rinse BID  . Chlorhexidine Gluconate Cloth  6 each Topical Daily  . clonazepam  0.25 mg Oral Daily  . collagenase   Topical Daily  . diclofenac Sodium  4 g Topical QID  . doxycycline  100 mg Oral Q12H  . escitalopram  10 mg  Oral QHS  . feeding supplement  237 mL Oral TID BM  . gabapentin  300 mg Oral TID  . lidocaine  2 patch Transdermal Q24H  . mouth rinse  15 mL Mouth Rinse q12n4p  . methadone  20 mg Oral Q12H  . multivitamin with minerals  1 tablet Oral Daily  . pantoprazole  40 mg Oral Daily  . sodium chloride flush  10-40 mL Intracatheter Q12H  . sodium chloride flush  10-40 mL Intracatheter Q12H   Continuous Infusions: . sodium chloride 10 mL/hr at 12/05/19 0617  . sodium chloride Stopped (12/19/19 2156)  . DAPTOmycin (CUBICIN)  IV 500 mg (01/02/20 2044)    Principal Problem:   MRSA infection Active Problems:   Septic shock (HCC)   Opioid use with withdrawal (HCC)   Endocarditis of tricuspid valve   Palliative care encounter   Protein-calorie malnutrition, severe   Consultants:  PCCM  Infectious disease  CVTS  Cardiology  Palliative medicine  Procedures:  10/22 echocardiogram  10/24 TEE  11/17 complete echocardiogram  11/22 intraoperative TEE/application of angio [facility]  11/24 core track  11/29 Limited echocardiogram  Antibiotics: Anti-infectives (From admission, onward)   Start     Dose/Rate Route Frequency Ordered Stop   12/13/19 1115  doxycycline (VIBRA-TABS) tablet 100 mg        100 mg Oral Every 12 hours 12/13/19 1027 01/16/20 2359   12/07/19 2000  DAPTOmycin (CUBICIN) 500 mg in sodium chloride 0.9 % IVPB        500 mg 220 mL/hr over 30 Minutes Intravenous Daily 12/06/19 1345 01/16/20 2359   12/02/19 1400  vancomycin (VANCOREADY) IVPB 500 mg/100 mL  Status:  Discontinued        500 mg 100 mL/hr over 60 Minutes Intravenous Every 8 hours 12/02/19 1127 12/02/19 1131   12/02/19 1400  vancomycin (VANCOREADY) IVPB 750 mg/150 mL  Status:  Discontinued        750 mg 150 mL/hr over 60 Minutes Intravenous Every 8 hours 12/02/19 1131 12/06/19 1339   12/01/19 1200  vancomycin (VANCOREADY) IVPB 500 mg/100 mL  Status:  Discontinued        500 mg 100 mL/hr over 60 Minutes  Intravenous Every 24 hours 11/30/19 1454 12/01/19 0946   12/01/19 1200  vancomycin (VANCOCIN) IVPB 1000 mg/200 mL premix  Status:  Discontinued        1,000 mg 200 mL/hr over 60 Minutes Intravenous Every 24 hours 12/01/19 0946 12/02/19 1127   11/30/19 1800  piperacillin-tazobactam (ZOSYN) IVPB 3.375 g  Status:  Discontinued        3.375 g 12.5 mL/hr over 240 Minutes Intravenous Every  8 hours 11/30/19 1454 12/02/19 0234   11/30/19 1115  piperacillin-tazobactam (ZOSYN) IVPB 3.375 g        3.375 g 100 mL/hr over 30 Minutes Intravenous  Once 11/30/19 1110 11/30/19 1306   11/30/19 1115  vancomycin (VANCOCIN) IVPB 1000 mg/200 mL premix        1,000 mg 200 mL/hr over 60 Minutes Intravenous  Once 11/30/19 1110 11/30/19 1337       Time spent: 20 minutes    Junious Silkllison Neka Bise ANP  [facility] Pager [phone].

## 2020-01-04 LAB — BASIC METABOLIC PANEL
Anion gap: 8 (ref 5–15)
BUN: 10 mg/dL (ref 6–20)
CO2: 28 mmol/L (ref 22–32)
Calcium: 8.7 mg/dL — ABNORMAL LOW (ref 8.9–10.3)
Chloride: 97 mmol/L — ABNORMAL LOW (ref 98–111)
Creatinine, Ser: 0.5 mg/dL (ref 0.44–1.00)
GFR, Estimated: >60 mL/min (ref >60)
Glucose, Bld: 87 mg/dL (ref 70–99)
Potassium: 3.7 mmol/L (ref 3.5–5.1)
Sodium: 133 mmol/L — ABNORMAL LOW (ref 135–145)

## 2020-01-04 LAB — CBC
HCT: 29.4 % — ABNORMAL LOW (ref 36.0–46.0)
Hemoglobin: 9.1 g/dL — ABNORMAL LOW (ref 12.0–15.0)
MCH: 27.5 pg (ref 26.0–34.0)
MCHC: 31 g/dL (ref 30.0–36.0)
MCV: 88.8 fL (ref 80.0–100.0)
Platelets: 224 10*3/uL (ref 150–400)
RBC: 3.31 MIL/uL — ABNORMAL LOW (ref 3.87–5.11)
RDW: 20.7 % — ABNORMAL HIGH (ref 11.5–15.5)
WBC: 11.4 10*3/uL — ABNORMAL HIGH (ref 4.0–10.5)
nRBC: 0 % (ref 0.0–0.2)

## 2020-01-04 LAB — FUNGUS CULTURE WITH STAIN

## 2020-01-04 LAB — FUNGUS CULTURE RESULT

## 2020-01-04 LAB — FUNGAL ORGANISM REFLEX

## 2020-01-04 NOTE — Progress Notes (Signed)
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 PMhx: admission 10/23-10/26 for endocarditis left AMA, IVDU, cellulitis, depression   OT comments  Pt making good progress with functional goals. OT states that she is feeling better today ad agreeable to OOB activity. Pt ambulated with RW to bathroom for toilet transfers, toileting, hygiene and oral care standing at sink. OT will continue to follow acutely to maximize level of function and safety  Follow Up Recommendations  No OT follow up    Equipment Recommendations  None recommended by OT    Recommendations for Other Services      Precautions / Restrictions Precautions Precautions: Fall Precaution Comments: low fall Restrictions Weight Bearing Restrictions: No       Mobility Bed Mobility Overal bed mobility: Modified Independent Bed Mobility: Supine to Sit     Supine to sit: Modified independent (Device/Increase time)        Transfers Overall transfer level: Needs assistance Equipment used: Rolling walker (2 wheeled);1 person hand held assist Transfers: Sit to/from Stand Sit to Stand: Supervision         General transfer comment: Pt ambulated to bathroom with RW or toileiting and grooming tass at sink    Balance Overall balance assessment: Modified Independent Sitting-balance support: Feet supported       Standing balance support: Bilateral upper extremity supported;During functional activity Standing balance-[name]: Good                             ADL either performed or assessed with clinical judgement   ADL Overall ADL's : Needs assistance/impaired     Grooming: Wash/dry face;Wash/dry hands;Min guard;Standing;Oral care Grooming Details (indicate cue type and  reason): pt stood at sink             Lower Body Dressing: Min guard;Sit to/from stand Lower Body Dressing Details (indicate cue type and reason): simulated Toilet Transfer: RW;Supervision/safety;Ambulation;Regular Toilet;Grab bars   Toileting- Clothing Manipulation and Hygiene: Supervision/safety;Sit to/from stand       Functional mobility during ADLs: Min guard;Supervision/safety       Vision Patient Visual Report: No change from baseline     Perception     Praxis      Cognition Arousal/Alertness: Awake/alert Behavior During Therapy: WFL for tasks assessed/performed Overall Cognitive Status: Within Functional Limits for tasks assessed                                          Exercises     Shoulder Instructions       General Comments      Pertinent Vitals/ Pain       Pain Assessment: 0-10 Pain Score: 2  Pain Location: L hip Pain Descriptors / Indicators: Aching;Guarding;Discomfort Pain Intervention(s): Monitored during session;Repositioned  Home Living                                          Prior Functioning/Environment              Frequency  Min 2X/week  Progress Toward Goals  OT Goals(current goals can now be found in the care plan section)  Progress towards OT goals: Progressing toward goals  Acute Rehab OT Goals Patient Stated Goal: less pain in L hip  Plan Discharge plan remains appropriate    Co-evaluation                 AM-PAC OT "6 Clicks" Daily Activity     Outcome Measure   Help from another person eating meals?: None Help from another person taking care of personal grooming?: A Little Help from another person toileting, which includes using toliet, bedpan, or urinal?: A Little Help from another person bathing (including washing, rinsing, drying)?: A Little Help from another person to put on and taking off regular upper body clothing?: None Help from another person to  put on and taking off regular lower body clothing?: A Little 6 Click Score: 20    End of Session Equipment Utilized During Treatment: Rolling walker  OT Visit Diagnosis: Unsteadiness on feet (R26.81);Other abnormalities of gait and mobility (R26.89);Muscle weakness (generalized) (M62.81)   Activity Tolerance Patient tolerated treatment well   Patient Left in bed;with call bell/phone within reach   Nurse Communication          Time: 6754-4920 OT Time Calculation (min): 25 min  Charges: OT General Charges $OT Visit: 1 Visit OT Treatments $Self Care/Home Management : 8-22 mins $Therapeutic Activity: 8-22 mins     Galen Manila 01/04/2020, 4:00 PM

## 2020-01-04 NOTE — Progress Notes (Signed)
[facility] PROGRESS NOTE  Amber [name] JSE:[id number] DOB: 1990-04-18 DOA: 11/30/2019 PCP: Patient, No Pcp Per     12/15  Status: Remains inpatient appropriate because:Ongoing active pain requiring inpatient pain management, Ongoing diagnostic testing needed not appropriate for outpatient work up, Unsafe d/c plan, IV treatments appropriate due to intensity of illness or inability to take PO and Inpatient level of care appropriate due to severity of illness   Dispo: The patient is from: Home              Anticipated d/c is to: Home              Anticipated d/c date is: > 3 days (01/16/20)              Patient currently is not medically stable to d/c.  Patient's primary reason for remaining in the hospital is to complete 6 weeks of IV and oral antibiotics for her endocarditis with septic emboli.  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  Code Status: Full Family Communication: Patient; mother at bedside on 12/14  DVT prophylaxis: SCDs 2/2 recent thrombocytopenia due to sepsis Vaccination status: Has not been vaccinated against Covid  Foley catheter: No  HPI: 29 year old female patient with known IV drug abuse with known tricuspid MRSA endocarditis who signed out AMA x2 prior to this admission.  She presented with progressive infection.  On 10/21 through 10/23 she was admitted with fever with blood cultures positive for MRSA.  CT of the chest demonstrated septic emboli and edematous L4-5 facet joint (? Evolving osteomyelitis) as well as tricuspid vegetation on CT.  She left AMA.  She returned 2 hours later on 10/23 with severe back pain.  Underwent TEE that demonstrated a 1 cm tricuspid valve mass.  She was evaluated by CT surgery who recommended medical management.  Again she left AMA on 10/25.  Patient was staying with friends but presented back to the hospital due to  increasing shortness of breath, weakness, fatigue malaise and nausea.  She arrived in the parking lot obtunded and was found to have multiple needles in her car on 11/17.  In the ER she was hypotensive and started on pressors.  Hemoglobin was low and she was given a blood transfusion.  She was also started on empiric IV vancomycin for known MRSA infection  and was admitted to the critical care service.  She eventually was taken to the OR for angio VAC during this admission.  She has subsequently transitioned out of ICU to stepdown unit.  She has had an IV infiltration and has a superficial wound on her left forearm. ID continues to follow regarding the endocarditis and recommends a total of 6 weeks therapy be daptomycin.  She has continued to have hypoxemia and productive cough and on 11/30 ID added doxycycline.  She is reporting pleuritic chest pain as well.  She is also very malnourished and was started on tube feedings and developed mild electrolyte disturbances from refeeding syndrome which have resolved.  She has low-grade resting tachycardia with no significant abnormalities on echo and cardiology previously did not suspect cardiac etiology to her tachycardia.  Hepatitis C antibody has been positive; she is HIV negative.  She has had persistent elevation in her LFTs.  She is currently on methadone 20 mg 3 times a day but will need to be on no more than 40 mg per 24 hours at time of  discharge to be eligible for outpatient methadone treatment.  Subjective: Reports decreased discomfort on left side when breathing.  Objective: Vitals:   01/04/20 0337 01/04/20 0943  BP: 107/62 102/62  Pulse: 95 (!) 106  Resp: 18 18  Temp: 97.9 F (36.6 C) 98.4 F (36.9 C)  SpO2: 97% 96%    Intake/Output Summary (Last 24 hours) at 01/04/2020 1243 Last data filed at 01/04/2020 [id number] Gross per 24 hour  Intake 1000 ml  Output 900 ml  Net 100 ml   Filed Weights   12/31/19 0647 01/02/20 0500 01/04/20 0624   Weight: 41.4 kg 41.4 kg 42.1 kg    Exam: General: Awake, minimal distress is related by ongoing but improved left-sided chest discomfort, calm Pulmonary: Lung sounds CTA, room air, no increased work of breathing Cardiac: S1 S2 without murmurs, no JVD, no peripheral edema, stable subtle tachycardia Abdomen: Soft, nontender nondistended.  Tolerating diet well. 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 -CT completed today revealed hydropneumothorax secondary to bronchopleural fistula in the left anterior lingula -Thoracic surgery on reevaluation today to determine if chest tube warranted -Patient is stable on room air but continues to have pleuritic chest discomfort -Patient and mother updated on diagnosis and possible treatment plan  Acute hypoxemic respiratory failure (multifactorial) 1 acute sepsis 2 septic emboli with sequela of small pleural effusion -Sepsis physiology and hypoxemia resolved -Continue oral doxycycline for sequela from septic pulmonary emboli-will DC at discharge -Continue supportive care for pleuritic chest discomfort; 12/21 due to reemergence of discomfort chest x-ray completed and does reveal loculated pleural fluid possibly loculated pneumothorax-augmentation made to obtain CT of the chest to evaluate for bronchopleural fistula -As of 12/14 has remained stable off of nasal cannula oxygen -Encourage use of incentive  spirometry  Persistent tachycardia/tricuspid valve insufficiency -Evaluated by CVTS and currently not a surgical candidate for cardiac valve replacement giving active IVDA prior to current admission (although was appropriate for angiovac surgery) -Echocardiogram: mild systolic LV dysfunction -cardiology felt this was  physiologic response to hypoalbuminemia/malnutrition, deconditioning and anemia. -Cardiology recommended to suppress physiologic tachycardia with beta-blockers nor utilize diuretics prophylactically due to increased risk of harm   LBP -Musculoskeletal in etiology noting no significant abnormalities on recent MRI -Continue supportive care with ibuprofen prn and lidocaine patch  Severe protein calorie malnutrition with associated refeeding syndrome -Initial BMI 17 patient briefly required tube feedings which have now been discontinued. -BMI continues to trend upward on regular diet with supplementation as below Ensure feeding supplements Nutrition Problem: Severe Malnutrition Etiology: acute illness (MRSA endocarditis) Signs/Symptoms: energy intake < or equal to 50% for > or equal to 5 days,percent weight loss (8.5% weight loss in less than 2 months) Percent weight loss: 8.5 % Interventions: Ensure Enlive (each supplement provides 350kcal and 20 grams of protein),Magic cup,MVI Estimated body mass index is 17.54 kg/m as calculated from the following:   Height as of this encounter: 5\' 1"  (1.549 m).   Weight as of this encounter: 42.1 kg.  Skin wound secondary to IV infiltration/right groin wound/right neck  incision     11/30/19                        12/20/19                         12/30/19  -ID also following along regarding this wound.  No indication to change current management and no indication to consult surgery -Dark eschar fell off on 12/10 as evidenced by pictures above wound continues to improve -Continue gabapentin 300 mg TID  Wound / Incision (Open or Dehisced)  12/10/19 Non-pressure wound Arm Left;Posterior;Lateral infiltration/phlebitis (Active)  Date First Assessed/Time First Assessed: 12/10/19 1915   Wound Type: Non-pressure wound  Location: Arm  Location Orientation: Left;Posterior;Lateral  Wound Description (Comments): infiltration/phlebitis    Assessments 12/10/2019 11:45 PM 01/04/2020 10:01 AM  Dressing Type Thin film;Non adherent;Gauze (Comment) --  Dressing Changed New --  Dressing Status Intact Clean;Dry;Intact  Dressing Change Frequency PRN --  Site / Wound Assessment Painful;Red;Bleeding Dressing in place / Unable to assess  Closure None --  Drainage Amount Minimal --  Drainage Description Serosanguineous --  Treatment Other (Comment) --     No Linked orders to display    Chronic IV drug abuse with opiates/recent withdrawal syndrome -12/13: Discussed with patient that plan is to decrease to discharge dose of methadone 40 mg daily by next Monday 12/20 -Pam has been decreased to 0.25 mg BID and dose will be decreased to once daily today  GAD -As noted above weaning and decreasing benzodiazepine -Discussed with patient's mother and as I have also noted patient has significant anxiety issues -12/10: Lexapro 10 mg HS initiated and plan to continue after discharge      Other problems: Anemia of chronic disease/recent sepsis related thrombocytopenia -Thrombocytopenia has resolved -Hemoglobin stable around 8 -With initiation of NSAIDs daily Protonix has been started  Hyponatremia -Current sodium stable at 133  Hep C antibody positive with persistent transaminitis -Currently has nonobstructive transaminitis that has improved with improvement in nutrition -As of 12/3 albumin 2.0, AST 52, ALT normal at 42 and total bilirubin has been normal -HCVRNA quantitative pending   Data Reviewed: Basic Metabolic Panel: Recent Labs  Lab 01/04/20 0325  NA 133*  K 3.7  CL 97*  CO2 28  GLUCOSE 87  BUN 10  CREATININE 0.50  CALCIUM  8.7*   Liver Function Tests: No results for input(s): AST, ALT, ALKPHOS, BILITOT, PROT, ALBUMIN in the last 168 hours. No results for input(s): LIPASE, AMYLASE in the last 168 hours. No results for input(s): AMMONIA in the last 168 hours. CBC: Recent Labs  Lab 01/04/20 0325  WBC 11.4*  HGB 9.1*  HCT 29.4*  MCV 88.8  PLT 224   Cardiac Enzymes: Recent Labs  Lab 12/29/19 0500  CKTOTAL 10*   BNP (last 3 results) Recent Labs    11/30/19 1809  BNP 583.0*    ProBNP (last 3 results) No results for input(s): PROBNP in the last 8760 hours.  CBG: No results for input(s): GLUCAP in the last 168 hours.  No results found for this or any previous visit (from the past 240 hour(s)).   Studies: CT CHEST W CONTRAST  Result Date: 01/03/2020 CLINICAL DATA:  Suspected loculated pneumothorax, evaluate for bronchopleural fistula EXAM: CT CHEST WITH CONTRAST TECHNIQUE: Multidetector CT imaging of the chest was performed during intravenous contrast administration. CONTRAST:  67mL OMNIPAQUE IOHEXOL 300 MG/ML  SOLN COMPARISON:  Chest radiograph dated  01/03/2020. CT chest dated 12/16/2019 FINDINGS: Cardiovascular: The heart is normal in size. Trace inferior pericardial fluid. No evidence of thoracic aortic aneurysm. Right arm PICC terminates in the upper right atrium. Mediastinum/Nodes: Small mediastinal lymph nodes which do not meet pathologic CT size criteria. Visualized thyroid is unremarkable. Lungs/Pleura: Multiple patchy/nodular opacities in the lungs bilaterally, some of which are cavitary, improved when compared to the prior. For example, a 17 x 11 mm nodule in the posterior right upper lobe (series 4/image 35), previously measured 22 x 16 mm. A 19 x 9 mm nodule in the medial left upper lobe (series 4/image 62) previously measured 20 x 13 mm. This appearance continues to favor multifocal pneumonia on the basis of septic emboli. Moderate gas and fluid collection in the left pleural space,  previously reflecting pleural fluid. Suspected direct communication via a dilated terminal bronchiole in the left anterior lingula (series 4/images 70-71), reflecting a bronchopleural fistula. Mild patchy opacities in the left lower lobe, atelectasis versus pneumonia. Trace right pleural effusion. Upper Abdomen: Visualized upper abdomen is grossly unremarkable. Musculoskeletal: Visualized osseous structures are within normal limits. IMPRESSION: Improving multifocal pneumonia, likely on the basis of septic emboli. 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 Trace right pleural effusion. Electronically Signed   By: Charline Bills M.D.   On: 01/03/2020 13:08   DG CHEST PORT 1 VIEW  Result Date: 01/03/2020 CLINICAL DATA:  Pleuritic chest pain.  History of drug overdose EXAM: PORTABLE CHEST 1 VIEW COMPARISON:  12/11/2019 FINDINGS: Right PICC with tip at the upper right atrium. Patchy pulmonary opacity with cavitary features as previously demonstrated. In place of loculated pleural fluid at the lateral left chest there is now an ovoid lucent area. Normal heart size. Probable splenomegaly IMPRESSION: Lucent area in the lateral left chest where there was previously loculated pleural fluid; suspect loculated pneumothorax. Consider chest CT to evaluate for bronchopleural fistula. Bilateral cavitary pneumonia which has likely improved. Electronically Signed   By: Marnee Spring M.D.   On: 01/03/2020 08:52    Scheduled Meds: . chlorhexidine  15 mL Mouth Rinse BID  . Chlorhexidine Gluconate Cloth  6 each Topical Daily  . clonazepam  0.25 mg Oral Daily  . collagenase   Topical Daily  . diclofenac Sodium  4 g Topical QID  . doxycycline  100 mg Oral Q12H  . escitalopram  10 mg Oral QHS  . feeding supplement  237 mL Oral TID BM  . gabapentin  300 mg Oral TID  . lidocaine  2 patch Transdermal Q24H  . mouth rinse  15 mL Mouth Rinse q12n4p  . methadone   20 mg Oral Q12H  . multivitamin with minerals  1 tablet Oral Daily  . pantoprazole  40 mg Oral Daily  . sodium chloride flush  10-40 mL Intracatheter Q12H  . sodium chloride flush  10-40 mL Intracatheter Q12H   Continuous Infusions: . sodium chloride 10 mL/hr at 12/05/19 0617  . sodium chloride Stopped (12/19/19 2156)  . DAPTOmycin (CUBICIN)  IV 500 mg (01/03/20 2008)    Principal Problem:   MRSA infection Active Problems:   Septic shock (HCC)   Opioid use with withdrawal (HCC)   Endocarditis of tricuspid valve   Palliative care encounter   Protein-calorie malnutrition, severe   Consultants:  PCCM  Infectious disease  CVTS  Cardiology  Palliative medicine  Procedures:  10/22 echocardiogram  10/24 TEE  11/17 complete echocardiogram  11/22 intraoperative TEE/application of  angio [facility]  11/24 core track  11/29 Limited echocardiogram  Antibiotics: Anti-infectives (From admission, onward)   Start     Dose/Rate Route Frequency Ordered Stop   12/13/19 1115  doxycycline (VIBRA-TABS) tablet 100 mg        100 mg Oral Every 12 hours 12/13/19 1027 01/16/20 2359   12/07/19 2000  DAPTOmycin (CUBICIN) 500 mg in sodium chloride 0.9 % IVPB        500 mg 220 mL/hr over 30 Minutes Intravenous Daily 12/06/19 1345 01/16/20 2359   12/02/19 1400  vancomycin (VANCOREADY) IVPB 500 mg/100 mL  Status:  Discontinued        500 mg 100 mL/hr over 60 Minutes Intravenous Every 8 hours 12/02/19 1127 12/02/19 1131   12/02/19 1400  vancomycin (VANCOREADY) IVPB 750 mg/150 mL  Status:  Discontinued        750 mg 150 mL/hr over 60 Minutes Intravenous Every 8 hours 12/02/19 1131 12/06/19 1339   12/01/19 1200  vancomycin (VANCOREADY) IVPB 500 mg/100 mL  Status:  Discontinued        500 mg 100 mL/hr over 60 Minutes Intravenous Every 24 hours 11/30/19 1454 12/01/19 0946   12/01/19 1200  vancomycin (VANCOCIN) IVPB 1000 mg/200 mL premix  Status:  Discontinued        1,000 mg 200 mL/hr over 60  Minutes Intravenous Every 24 hours 12/01/19 0946 12/02/19 1127   11/30/19 1800  piperacillin-tazobactam (ZOSYN) IVPB 3.375 g  Status:  Discontinued        3.375 g 12.5 mL/hr over 240 Minutes Intravenous Every 8 hours 11/30/19 1454 12/02/19 0234   11/30/19 1115  piperacillin-tazobactam (ZOSYN) IVPB 3.375 g        3.375 g 100 mL/hr over 30 Minutes Intravenous  Once 11/30/19 1110 11/30/19 1306   11/30/19 1115  vancomycin (VANCOCIN) IVPB 1000 mg/200 mL premix        1,000 mg 200 mL/hr over 60 Minutes Intravenous  Once 11/30/19 1110 11/30/19 1337       Time spent: 20 minutes    [name] ANP  [facility] Pager [phone].

## 2020-01-05 ENCOUNTER — Inpatient Hospital Stay (HOSPITAL_COMMUNITY): Payer: Self-pay

## 2020-01-05 LAB — CK: Total CK: 24 U/L — ABNORMAL LOW (ref 38–234)

## 2020-01-05 LAB — PROTIME-INR
INR: 1.3 — ABNORMAL HIGH (ref 0.8–1.2)
Prothrombin Time: 15.6 seconds — ABNORMAL HIGH (ref 11.4–15.2)

## 2020-01-05 IMAGING — CT CT PERC PLEURAL DRAIN W/INDWELL CATH W/IMG GUIDE
1 of 3 series · 13 of 32 positions shown, 18 images · non-contrast
Comparison: none

INDICATION: 29-year-old with known IV drug abuse and tricuspid MRSA
endocarditis. Patient has a left hydropneumothorax likely secondary
to a bronchopleural fistula. CT surgery recommended placement of a
image guided chest tube.

[Series 2: i-spiral 5.0 b40f · axial · 0.69mm/px · z∈[+1179,+1378]mm · 13 of 65 slices shown, 18 images]
[im 4/65  soft-tissue]
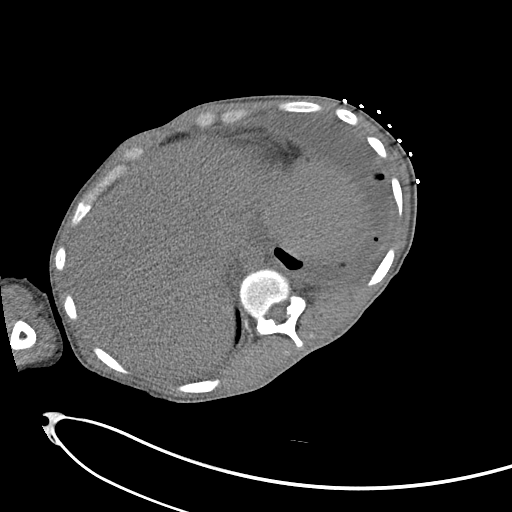
[im 4/65  bone]
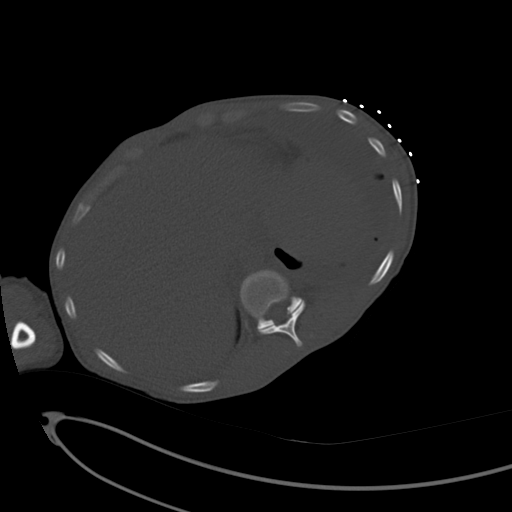
[im 11/65  soft-tissue]
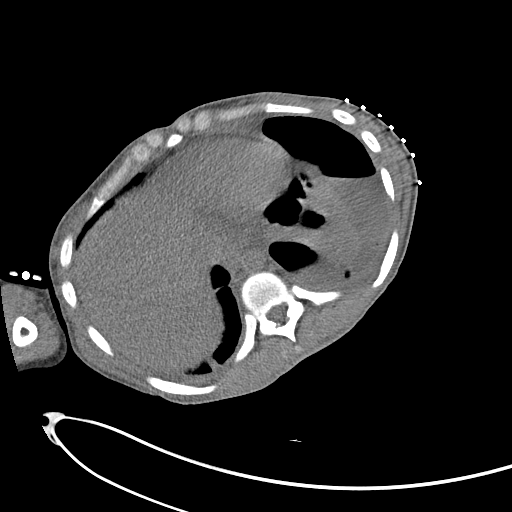
[im 14/65  soft-tissue]
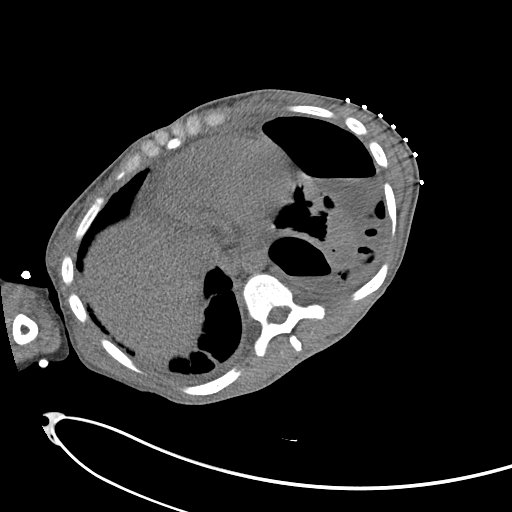
[im 21/65  soft-tissue]
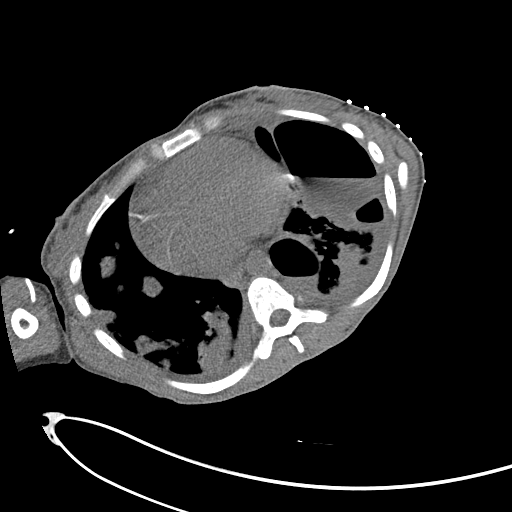
[im 24/65  soft-tissue]
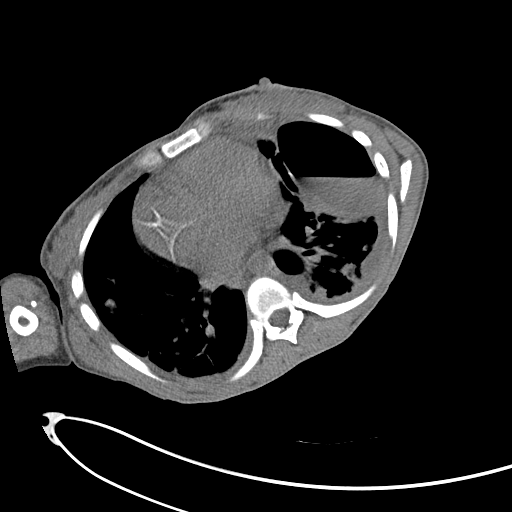
[im 31/65  soft-tissue]
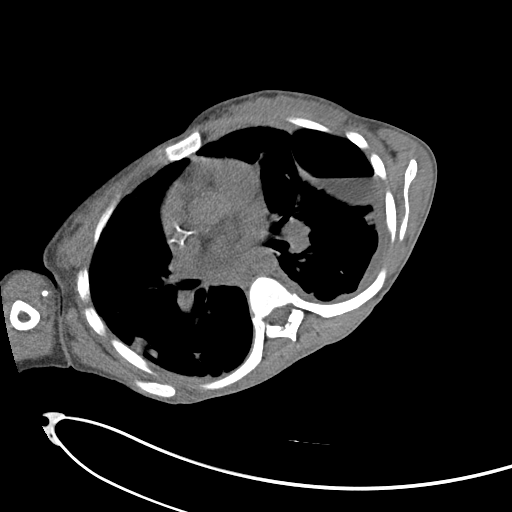
[im 34/65  soft-tissue]
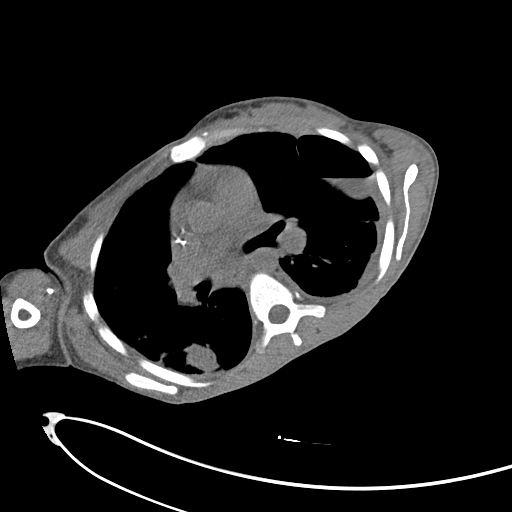
[im 41/65  soft-tissue]
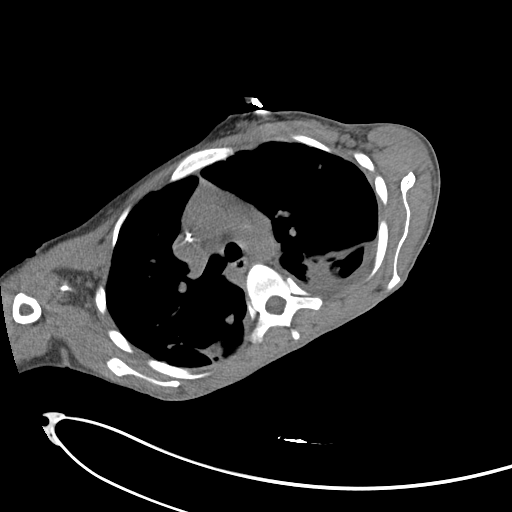
[im 44/65  soft-tissue]
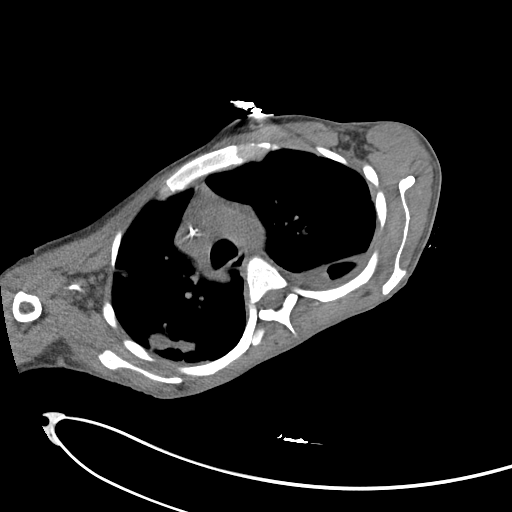
[im 44/65  bone]
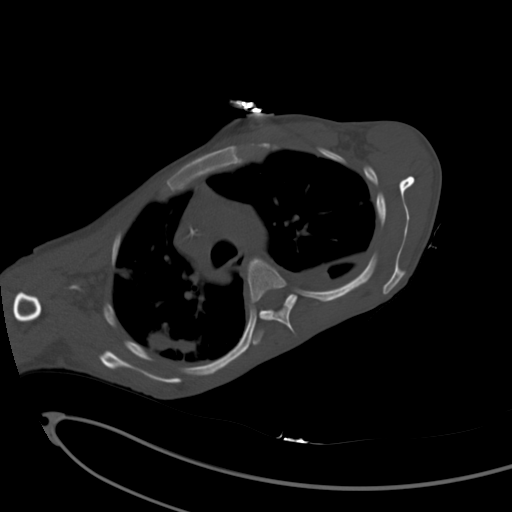
[im 51/65  soft-tissue]
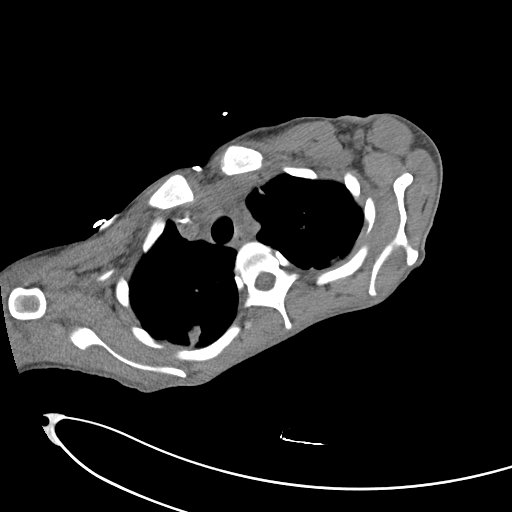
[im 51/65  lung]
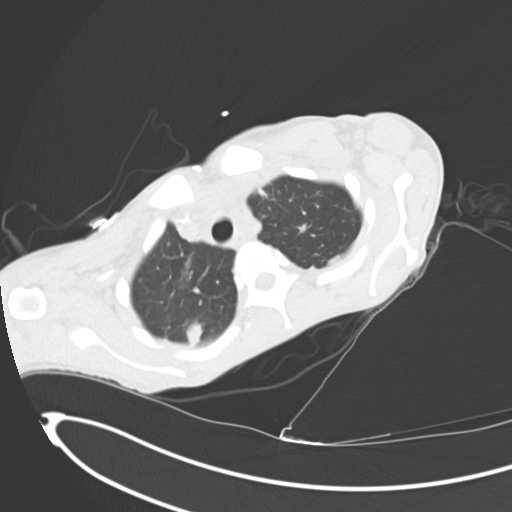
[im 54/65  soft-tissue]
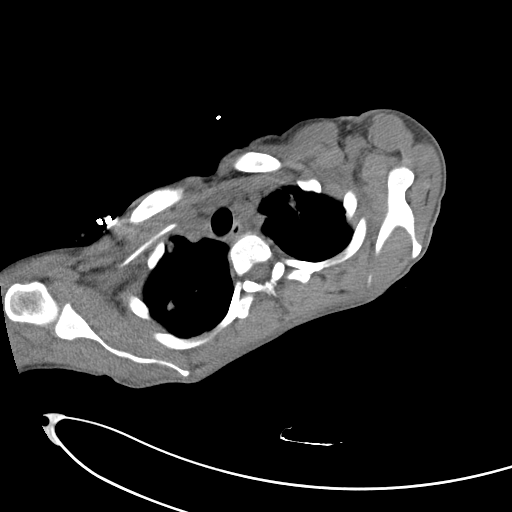
[im 54/65  lung]
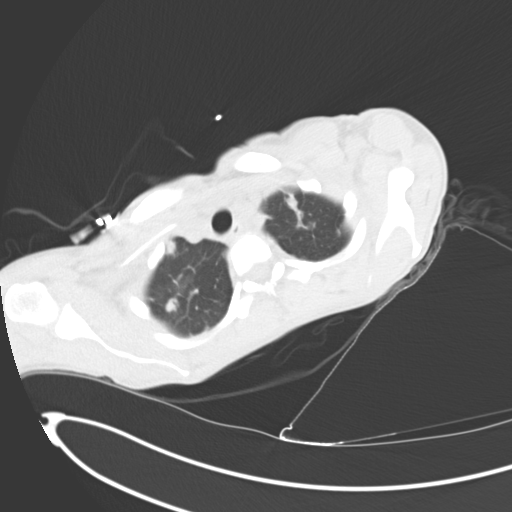
[im 58/65  lung]
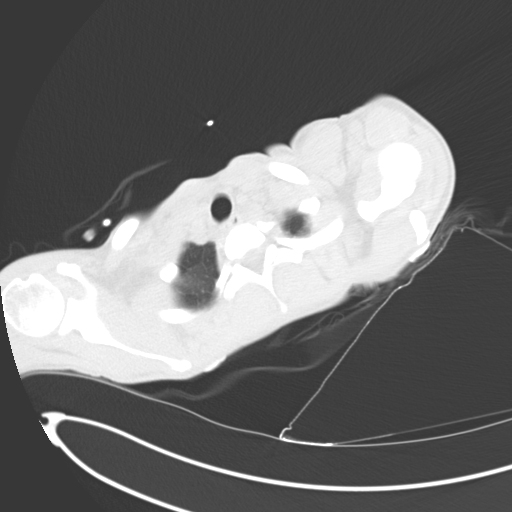
[im 61/65  soft-tissue]
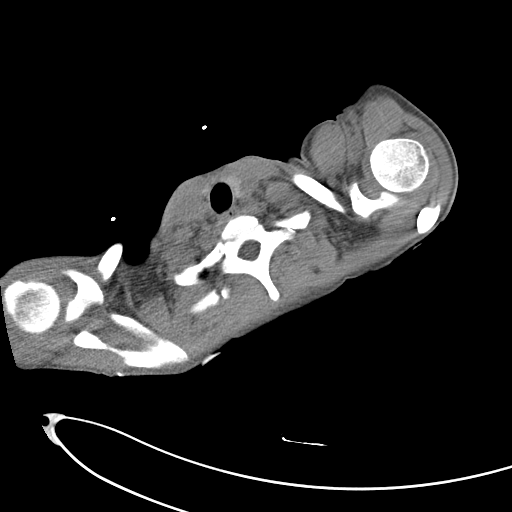
[im 61/65  lung]
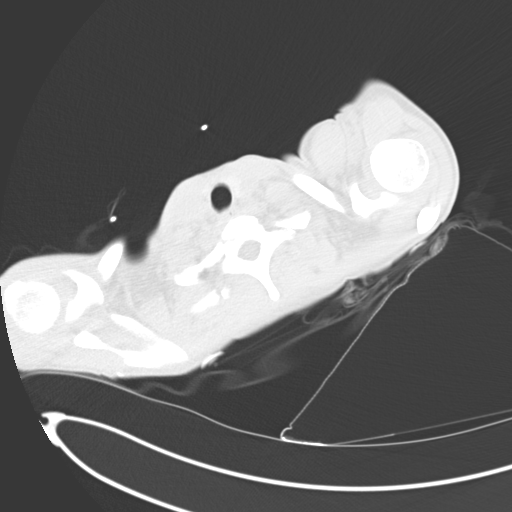

[13 of 32 positions shown; findings below may reference images not displayed]

EXAM:
CT-GUIDED CHEST TUBE PLACEMENT

MEDICATIONS:
Moderate sedation

ANESTHESIA/SEDATION:
Fentanyl 50 mcg IV; Versed 1.0 mg IV

Moderate Sedation Time:  23 minutes

The patient was continuously monitored during the procedure by the
interventional radiology nurse under my direct supervision.

COMPLICATIONS:
None immediate.

PROCEDURE:
Informed written consent was obtained from the patient after a
thorough discussion of the procedural risks, benefits and
alternatives. All questions were addressed. Maximal Sterile Barrier
Technique was utilized including caps, mask, sterile gowns, sterile
gloves, sterile drape, hand hygiene and skin antiseptic. A timeout
was performed prior to the initiation of the procedure.

Patient was placed supine on the CT scanner with the left side
mildly elevated. CT images through the chest were obtained. Left mid
axillary region was prepped with chlorhexidine and sterile field was
created. Skin and soft tissues were anesthetized using 1% lidocaine.
Small incision was made. Using CT guidance, a Yueh catheter was
directed into the left pleural space. Gas was aspirated. A small
amount of yellow fluid was aspirated. Superstiff Amplatz wire was
advanced into the pleural space and the tract was dilated to
accommodate a 14 French multipurpose drain. Catheter was attached to
chest tube drainage system and attached to wall suction. Follow up
CT images were obtained. Catheter was sutured to skin.
FINDINGS: Again noted are numerous irregular opacities throughout both lungs
and concerning for septic emboli. There is a large air-fluid
collection in the left lower chest and similar to the recent CT on
[DATE]. Hydropneumothorax was slightly smaller in size on the
post drain images. Gas within the subcutaneous tissues on the post
drain images.
IMPRESSION: CT-guided placement of a chest tube within the left
hydropneumothorax.

## 2020-01-05 MED ORDER — CLONAZEPAM 0.5 MG PO TABS
0.5000 mg | ORAL_TABLET | Freq: Once | ORAL | Status: AC
  Administered 2020-01-05: 11:00:00 0.5 mg via ORAL
  Filled 2020-01-05: qty 1

## 2020-01-05 MED ORDER — MIDAZOLAM HCL 2 MG/2ML IJ SOLN
INTRAMUSCULAR | Status: AC | PRN
  Administered 2020-01-05: 1 mg via INTRAVENOUS

## 2020-01-05 MED ORDER — FENTANYL CITRATE (PF) 100 MCG/2ML IJ SOLN
INTRAMUSCULAR | Status: AC
  Filled 2020-01-05: qty 2

## 2020-01-05 MED ORDER — FENTANYL CITRATE (PF) 100 MCG/2ML IJ SOLN
INTRAMUSCULAR | Status: AC | PRN
  Administered 2020-01-05: 25 ug via INTRAVENOUS

## 2020-01-05 MED ORDER — DIPHENHYDRAMINE HCL 50 MG/ML IJ SOLN
INTRAMUSCULAR | Status: AC
  Filled 2020-01-05: qty 1

## 2020-01-05 MED ORDER — MIDAZOLAM HCL 2 MG/2ML IJ SOLN
INTRAMUSCULAR | Status: AC
  Filled 2020-01-05: qty 2

## 2020-01-05 MED ORDER — OXYCODONE HCL 5 MG PO TABS
10.0000 mg | ORAL_TABLET | ORAL | Status: DC | PRN
  Administered 2020-01-05 – 2020-01-06 (×4): 10 mg via ORAL
  Filled 2020-01-05 (×4): qty 2

## 2020-01-05 NOTE — Progress Notes (Signed)
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 PMhx: admission 10/23-10/26 for endocarditis left AMA, IVDU, cellulitis, depression    PT Comments    Patient refused PT initially, with encouragement patient agreeable to in room ambulation. Patient ambulated 73' with RW and supervision, initially toe walking on L but progressed to foot flat. Patient continues to be limited by generalized weakness, decreased activity tolerance. Continue to recommend OPPT following discharge if hip pain continues.     Follow Up Recommendations  Outpatient PT;Other (comment) (if hip pain continues)     Equipment Recommendations  Rolling Seichi Kaufhold with 5" wheels;3in1 (PT)    Recommendations for Other Services       Precautions / Restrictions Precautions Precautions: Fall Precaution Comments: low fall Restrictions Weight Bearing Restrictions: No    Mobility  Bed Mobility Overal bed mobility: Modified Independent                Transfers Overall transfer level: Modified independent Equipment used: Rolling Calisa Luckenbaugh (2 wheeled)                Ambulation/Gait Ambulation/Gait assistance: Supervision Gait Distance (Feet): 110 Feet Assistive device: Rolling Jamecia Lerman (2 wheeled) Gait Pattern/deviations: Step-through pattern;Decreased stride length;Wide base of support     General Gait Details: supervision for safety, initially toe touch on L with ambulation with progression to foot flat. Refused therapy initially, however with encouragement, agreeable to in room ambulation   Stairs             Wheelchair Mobility    Modified Rankin (Stroke Patients Only)       Balance Overall balance assessment: Modified Independent                                           Cognition Arousal/Alertness: Awake/alert Behavior During Therapy: Flat affect Overall Cognitive Status: Within Functional Limits for tasks assessed                                        Exercises General Exercises - Lower Extremity Long Arc Quad: AROM;Both;10 reps;Seated    General Comments        Pertinent Vitals/Pain Pain Assessment: Faces Faces Pain Scale: Hurts a little bit Pain Location: L hip Pain Descriptors / Indicators: Discomfort;Guarding Pain Intervention(s): Monitored during session    Home Living                      Prior Function            PT Goals (current goals can now be found in the care plan section) Acute Rehab PT Goals Patient Stated Goal: less pain in L hip PT Goal Formulation: With patient Time For Goal Achievement: 01/13/20 Potential to Achieve Goals: Fair Progress towards PT goals: Progressing toward goals    Frequency    Min 3X/week      PT Plan Current plan remains appropriate    Co-evaluation              AM-PAC PT "6 Clicks" Mobility   Outcome Measure  Help needed turning from your back  to your side while in a flat bed without using bedrails?: None Help needed moving from lying on your back to sitting on the side of a flat bed without using bedrails?: None Help needed moving to and from a bed to a chair (including a wheelchair)?: None Help needed standing up from a chair using your arms (e.g., wheelchair or bedside chair)?: None Help needed to walk in hospital room?: A Little Help needed climbing 3-5 steps with a railing? : A Little 6 Click Score: 22    End of Session   Activity Tolerance: Patient tolerated treatment well Patient left: in bed;with call bell/phone within reach Nurse Communication: Mobility status PT Visit Diagnosis: Difficulty in walking, not elsewhere classified (R26.2);Pain Pain - Right/Left: Left Pain - part of body: Hip     Time: 4627-0350 PT Time  Calculation (min) (ACUTE ONLY): 12 min  Charges:  $Therapeutic Activity: 8-22 mins                     Gregor Hams, PT, DPT Acute Rehabilitation Services Pager [phone] Office [phone]    Zannie Kehr Allred 01/05/2020, 9:56 AM

## 2020-01-05 NOTE — Progress Notes (Addendum)
[facility] PROGRESS NOTE  Amber [name]:[id number] DOB: [birth year] DOA: 11/30/2019 PCP: Patient, No Pcp Per     12/15  Status: Remains inpatient appropriate because:Ongoing active pain requiring inpatient pain management, Ongoing diagnostic testing needed not appropriate for outpatient work up, Unsafe d/c plan, IV treatments appropriate due to intensity of illness or inability to take PO and Inpatient level of care appropriate due to severity of illness   Dispo: The patient is from: Home              Anticipated d/c is to: Home              Anticipated d/c date is: > 3 days (01/16/20)              Patient currently is not medically stable to d/c.  Patient's primary reason for remaining in the hospital is to complete 6 weeks of IV and oral antibiotics for her endocarditis with septic emboli.  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  Code Status: Full Family Communication: Patient; mother 12/21 and 12/23 DVT prophylaxis: SCDs 2/2 recent thrombocytopenia due to sepsis Vaccination status: Has not been vaccinated against Covid  Foley catheter: No  HPI: 29 year old female patient with known IV drug abuse with known tricuspid MRSA endocarditis who signed out AMA x2 prior to this admission.  She presented with progressive infection.  On 10/21 through 10/23 she was admitted with fever with blood cultures positive for MRSA.  CT of the chest demonstrated septic emboli and edematous L4-5 facet joint (? Evolving osteomyelitis) as well as tricuspid vegetation on CT.  She left AMA.  She returned 2 hours later on 10/23 with severe back pain.  Underwent TEE that demonstrated a 1 cm tricuspid valve mass.  She was evaluated by CT surgery who recommended medical management.  Again she left AMA on 10/25.  Patient was staying with friends but presented back to the hospital due to increasing  shortness of breath, weakness, fatigue malaise and nausea.  She arrived in the parking lot obtunded and was found to have multiple needles in her car on 11/17.  In the ER she was hypotensive and started on pressors.  Hemoglobin was low and she was given a blood transfusion.  She was also started on empiric IV vancomycin for known MRSA infection  and was admitted to the critical care service.  She eventually was taken to the OR for angio VAC during this admission.  She has subsequently transitioned out of ICU to stepdown unit.  She has had an IV infiltration and has a superficial wound on her left forearm. ID continues to follow regarding the endocarditis and recommends a total of 6 weeks therapy be daptomycin.  She has continued to have hypoxemia and productive cough and on 11/30 ID added doxycycline.  She is reporting pleuritic chest pain as well.  She is also very malnourished and was started on tube feedings and developed mild electrolyte disturbances from refeeding syndrome which have resolved.  She has low-grade resting tachycardia with no significant abnormalities on echo and cardiology previously did not suspect cardiac etiology to her tachycardia.  Hepatitis C antibody has been positive; she is HIV negative.  She has had persistent elevation in her LFTs.  She is currently on methadone 20 mg 3 times a day but will need to be on no more than 40 mg per 24 hours at time of discharge to  be eligible for outpatient methadone treatment.  Subjective: Very anxious regarding upcoming procedure.  Requested imaging be completed prior to procedure stating "I feel better and what ever this is may be gone now".  Discussed with patient that the procedure actually require CT imaging to locate the hydropneumothorax therefore if it had improved or resolved this would aid the radiologist and decision regarding whether or not to proceed.  She is agreeable to undergoing procedure if necessary.  She is aware she has one-time  dose of Klonopin available to help with her anxiety.  Objective: Vitals:   01/04/20 1956 01/05/20 0330  BP: 125/86 123/80  Pulse: (!) 106 (!) 110  Resp: 19 18  Temp: 98.6 F (37 C) 98.8 F (37.1 C)  SpO2: 100% 96%    Intake/Output Summary (Last 24 hours) at 01/05/2020 1236 Last data filed at 01/05/2020 1012 Gross per 24 hour  Intake 280 ml  Output --  Net 280 ml   Filed Weights   12/31/19 0647 01/02/20 0500 01/04/20 0624  Weight: 41.4 kg 41.4 kg 42.1 kg    Exam: General: Awake, no distress, reports improved near resolved left side chest discomfort, calm Pulmonary: Bilateral lung sounds clear to auscultation anteriorly, room air, no increased work of breathing at rest Cardiac: Also is regular and slightly tachycardic at rest, S1-S2 without any murmurs, no peripheral edema Abdomen: Soft, nontender nondistended.  NPO. 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 Lightfoot's assistance.  Plan is to proceed with CT-guided chest tube placement today on 12/23; patient will be given one-time dose of Klonopin preprocedure to aid in anxiety -Patient is stable on room air but continues to have pleuritic chest discomfort  **1430: Underwent CT guided CT placement with the following findings:14 Fr drain placed.  Small  amount of yellow fluid removed.  Large amount of air aspirated. CT connected to CT drainage system w/ recommendation for TCTS to follow/manage  Acute hypoxemic respiratory failure (multifactorial) 1 acute sepsis 2 septic emboli with sequela of small pleural effusion -Sepsis physiology and hypoxemia resolved -Continue oral doxycycline for sequela from septic pulmonary emboli-will DC at discharge -Continue supportive care for pleuritic chest discomfort; 12/21 due to reemergence of discomfort chest x-ray completed and does reveal loculated pleural fluid possibly loculated pneumothorax-augmentation made to obtain CT of the chest to evaluate for bronchopleural fistula -As of 12/14 has remained stable off of nasal cannula oxygen -Encourage use of incentive spirometry  Persistent tachycardia/tricuspid valve insufficiency -Evaluated by CVTS and currently not a surgical candidate for cardiac valve replacement giving active IVDA prior to current admission (although was appropriate for angiovac surgery) -Echocardiogram: mild systolic LV dysfunction -cardiology felt this was  physiologic response to hypoalbuminemia/malnutrition, deconditioning and anemia. -Cardiology recommended to suppress physiologic tachycardia with beta-blockers nor utilize diuretics prophylactically due to increased risk of harm   LBP -Musculoskeletal in etiology noting no significant abnormalities on recent MRI -Continue supportive care with ibuprofen prn and lidocaine patch  Severe protein calorie malnutrition with associated refeeding syndrome -Initial BMI 17 patient briefly required tube feedings which have now been discontinued. -BMI continues to trend upward on regular diet with supplementation as below Ensure feeding supplements Nutrition Problem: Severe Malnutrition Etiology: acute illness (MRSA endocarditis) Signs/Symptoms: energy intake < or equal to 50% for > or equal to 5 days,percent weight loss (8.5% weight loss in  less than 2 months) Percent weight loss: 8.5 % Interventions: Ensure Enlive (each supplement provides 350kcal and 20 grams of protein),Magic cup,MVI Estimated body mass index is 17.54 kg/m as calculated from the following:   Height as of this encounter: 5\' 1"  (1.549 m).   Weight as of this encounter: 42.1 kg.  Skin wound secondary to IV infiltration/right groin wound/right neck incision     11/30/19                        12/20/19                         12/30/19  -ID also following along regarding this wound.  No indication to change current management and no indication to consult surgery -Dark eschar fell off on 12/10 as evidenced by pictures above wound continues to improve -Continue gabapentin 300 mg TID  Wound / Incision (Open or Dehisced) 12/10/19 Non-pressure wound Arm Left;Posterior;Lateral infiltration/phlebitis (Active)  Date First Assessed/Time First Assessed: 12/10/19 1915   Wound Type: Non-pressure wound  Location: Arm  Location Orientation: Left;Posterior;Lateral  Wound Description (Comments): infiltration/phlebitis    Assessments 12/10/2019 11:45 PM 01/04/2020  3:55 PM  Dressing Type Thin film;Non adherent;Gauze (Comment) Other (Comment)  Dressing Changed New Changed  Dressing Status Intact Clean;Dry;Intact  Dressing Change Frequency PRN --  Site / Wound Assessment Painful;Red;Bleeding Painful;Pink;Red;Dry;Clean  Peri-wound Assessment -- Intact;Pink  Closure None None  Drainage Amount Minimal Minimal  Drainage Description Serosanguineous Serosanguineous  Treatment Other (Comment) --     No Linked orders to display    Chronic IV drug abuse with opiates/recent withdrawal syndrome -12/13: Discussed with patient that plan is to decrease to discharge dose of methadone 40 mg daily by next Monday 12/20 -Pam has been decreased to 0.25 mg BID and dose will be decreased to once daily today  GAD -As noted above weaning and decreasing benzodiazepine -Discussed with  patient's mother and as I have also noted patient has significant anxiety issues -12/10: Lexapro 10 mg HS initiated and plan to continue after discharge      Other problems: Anemia of chronic disease/recent sepsis related thrombocytopenia -Thrombocytopenia has resolved -Hemoglobin stable around 8 -With initiation of NSAIDs daily Protonix has been started  Hyponatremia -Current sodium stable at 133  Hep C antibody positive with persistent transaminitis -Currently has nonobstructive transaminitis that has improved with improvement in nutrition -As of 12/3 albumin 2.0, AST 52, ALT normal at 42 and total bilirubin has been normal -HCVRNA quantitative pending   Data Reviewed: Basic Metabolic Panel: Recent Labs  Lab 01/04/20 0325  NA 133*  K 3.7  CL 97*  CO2 28  GLUCOSE 87  BUN 10  CREATININE 0.50  CALCIUM 8.7*   Liver Function Tests: No results for input(s): AST, ALT, ALKPHOS, BILITOT, PROT, ALBUMIN in the last 168 hours. No results for input(s): LIPASE, AMYLASE in the last 168 hours. No results for input(s): AMMONIA in the last 168 hours. CBC: Recent Labs  Lab 01/04/20 0325  WBC 11.4*  HGB 9.1*  HCT 29.4*  MCV 88.8  PLT 224   Cardiac Enzymes: Recent Labs  Lab 01/05/20 0317  CKTOTAL 24*   BNP (last 3 results) Recent Labs    11/30/19 1809  BNP 583.0*    ProBNP (last 3 results) No results for input(s): PROBNP in the last 8760 hours.  CBG: No results for input(s): GLUCAP in the last 168 hours.  No results found for this or any previous visit (from the past 240 hour(s)).   Studies: No results found.  Scheduled Meds: . chlorhexidine  15 mL Mouth Rinse BID  . Chlorhexidine Gluconate Cloth  6 each Topical Daily  . clonazepam  0.25 mg Oral Daily  . collagenase   Topical Daily  . diclofenac Sodium  4 g Topical QID  . doxycycline  100 mg Oral Q12H  . escitalopram  10 mg Oral QHS  . feeding supplement  237 mL Oral TID BM  . gabapentin  300 mg Oral  TID  . lidocaine  2 patch Transdermal Q24H  . mouth rinse  15 mL Mouth Rinse q12n4p  . methadone  20 mg Oral Q12H  . multivitamin with minerals  1 tablet Oral Daily  . pantoprazole  40 mg Oral Daily  . sodium chloride flush  10-40 mL Intracatheter Q12H  . sodium chloride flush  10-40 mL Intracatheter Q12H   Continuous Infusions: . sodium chloride 10 mL/hr at 12/05/19 0617  . sodium chloride Stopped (12/19/19 2156)  . DAPTOmycin (CUBICIN)  IV 500 mg (01/04/20 2104)    Principal Problem:   MRSA infection Active Problems:   Septic shock (HCC)   Opioid use with withdrawal (HCC)   Endocarditis of tricuspid valve   Palliative care encounter   Protein-calorie malnutrition, severe   Consultants:  PCCM  Infectious disease  CVTS  Cardiology  Palliative medicine  Procedures:  10/22 echocardiogram  10/24 TEE  11/17 complete echocardiogram  11/22 intraoperative TEE/application of angio [facility]  11/24 core track  11/29 Limited echocardiogram  Antibiotics: Anti-infectives (From admission, onward)   Start     Dose/Rate Route Frequency Ordered Stop   12/13/19 1115  doxycycline (VIBRA-TABS) tablet 100 mg        100 mg Oral Every 12 hours 12/13/19 1027 01/16/20 2359   12/07/19 2000  DAPTOmycin (CUBICIN) 500 mg in sodium chloride 0.9 % IVPB        500 mg 220 mL/hr over 30 Minutes Intravenous Daily 12/06/19 1345 01/16/20 2359   12/02/19 1400  vancomycin (VANCOREADY) IVPB 500 mg/100 mL  Status:  Discontinued        500 mg 100 mL/hr over 60 Minutes Intravenous Every 8 hours 12/02/19 1127 12/02/19 1131   12/02/19 1400  vancomycin (VANCOREADY) IVPB 750 mg/150 mL  Status:  Discontinued        750 mg 150 mL/hr over 60 Minutes Intravenous Every 8 hours 12/02/19 1131 12/06/19 1339   12/01/19 1200  vancomycin (VANCOREADY) IVPB 500 mg/100 mL  Status:  Discontinued        500 mg 100 mL/hr over 60 Minutes Intravenous Every 24 hours 11/30/19 1454 12/01/19 0946   12/01/19 1200   vancomycin (VANCOCIN) IVPB 1000 mg/200 mL premix  Status:  Discontinued        1,000 mg 200 mL/hr over 60 Minutes Intravenous Every 24 hours 12/01/19 0946 12/02/19 1127   11/30/19 1800  piperacillin-tazobactam (ZOSYN) IVPB 3.375 g  Status:  Discontinued        3.375 g 12.5 mL/hr over 240 Minutes Intravenous Every 8 hours  11/30/19 1454 12/02/19 0234   11/30/19 1115  piperacillin-tazobactam (ZOSYN) IVPB 3.375 g        3.375 g 100 mL/hr over 30 Minutes Intravenous  Once 11/30/19 1110 11/30/19 1306   11/30/19 1115  vancomycin (VANCOCIN) IVPB 1000 mg/200 mL premix        1,000 mg 200 mL/hr over 60 Minutes Intravenous  Once 11/30/19 1110 11/30/19 1337       Time spent: 20 minutes    [name] ANP  [facility] 7 am - 330 pm/M-F

## 2020-01-05 NOTE — Progress Notes (Signed)
Pt received back from IR with CT left side, we set it up to 20 cm suction as ordered.

## 2020-01-05 NOTE — Procedures (Signed)
Interventional Radiology Procedure:   Indications: Left hydropneumothorax.  Procedure: CT guided chest tube placement  Findings: 14 Fr drain placed.  Small amount of yellow fluid removed.  Large amount of air aspirated.  Complications: None     EBL: less than 5 ml  Plan: Chest tube to chest tube drainage system.   Management per CT surgery.     Keelin Sheridan R. Lowella Dandy, MD  Pager: [phone]

## 2020-01-05 NOTE — Consult Note (Signed)
Chief Complaint: Patient was seen in consultation today for left hydropneumothorax chest tube placement Chief Complaint  Patient presents with  . Drug Overdose   at the request of Dr Joanna Puff  Supervising Physician: Richarda Overlie  Patient Status: [facility] - In-pt  History of Present Illness: Amber [name] is a 29 y.o. female   IV dug abuse MRSA bacteremia Known Tricuspid valve endocarditis Has signed out AMA  2 x before this admission  Re admitted 11/17 for SOB and weakness and fatigue Noted arm wound from IV infiltration-- treatment for this Hypoxemia; cough; PNA Malnutrition Methadone treatment ongoing  CT 12/21: IMPRESSION: Improving multifocal pneumonia, likely on the basis of septic emboli. 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 Trace right pleural effusion.  Dr Cliffton Asters note 12/21:  Amber [name] is well-known to our service.  She has undergone angiographic debridement of her tricuspid valve for endocarditis this hospitalization.  I reviewed her CT scan, and she does have a loculated hydropneumothorax on the left side.  I would recommend that an image guided chest tube be placed for management of the hydropneumothorax.  She continues to appear very deconditioned and frail, and would only reserve surgery if pigtail catheter is not successful in treating the loculated collection  Pt is feeling better today Less pain; less cough; less SOB  Dr Lowella Dandy has reviewed imaging and approves procedure   Past Medical History:  Diagnosis Date  . Heroin abuse (HCC)   . Kidney stones   . MRSA (methicillin resistant staph aureus) culture positive   . Ovarian cyst   . UTI (lower urinary tract infection)     Past Surgical History:  Procedure Laterality Date  . APPLICATION OF ANGIOVAC N/A 12/05/2019   Procedure: APPLICATION OF ANGIOVAC;  Surgeon: Corliss Skains, MD;  Location: [facility] OR;  Service: Vascular;   Laterality: N/A;  Patient will need intraoperative TEE Can be performed in OR 14, 15, or 17 if OR 16 is not available  . FINGER SURGERY    . TEE WITHOUT CARDIOVERSION N/A 11/07/2019   Procedure: TRANSESOPHAGEAL ECHOCARDIOGRAM (TEE);  Surgeon: Wendall Stade, MD;  Location: [facility] ENDOSCOPY;  Service: Cardiovascular;  Laterality: N/A;  . TEE WITHOUT CARDIOVERSION  12/05/2019   Procedure: TRANSESOPHAGEAL ECHOCARDIOGRAM (TEE);  Surgeon: Corliss Skains, MD;  Location: [facility] OR;  Service: Vascular;;  . TEE WITHOUT CARDIOVERSION N/A 12/05/2019   Procedure: TRANSESOPHAGEAL ECHOCARDIOGRAM (TEE);  Surgeon: Corliss Skains, MD;  Location: [facility] OR;  Service: Thoracic;  Laterality: N/A;    Allergies: Bee venom  Medications: Prior to Admission medications   Medication Sig Start Date End Date Taking? Authorizing Provider  acetaminophen (TYLENOL) 500 MG tablet Take 1,000 mg by mouth every 6 (six) hours as needed for headache.    Yes [provider]  ibuprofen (ADVIL) 600 MG tablet Take 1 tablet (600 mg total) by mouth every 6 (six) hours as needed. Patient not taking: Reported on 11/03/2019 08/20/18   Fayrene Helper, PA-C  ondansetron (ZOFRAN ODT) 4 MG disintegrating tablet Take 1 tablet (4 mg total) by mouth every 8 (eight) hours as needed for nausea or vomiting. Patient not taking: Reported on 11/03/2019 01/11/19   Liberty Handy, PA-C     Family History  Problem Relation Age of Onset  . Valvular heart disease Maternal Grandmother     Social History   Socioeconomic History  . Marital status: Single    Spouse name: Not on file  .  Number of children: Not on file  . Years of education: Not on file  . Highest education level: Not on file  Occupational History  . Not on file  Tobacco Use  . Smoking status: Former Smoker    Types: Cigarettes  . Smokeless tobacco: Never Used  Vaping Use  . Vaping Use: Never used  Substance and Sexual Activity  . Alcohol use: No  . Drug use: Yes     Types: Marijuana, IV    Comment: opiods- pain medication/heroin  . Sexual activity: Yes    Birth control/protection: None  Other Topics Concern  . Not on file  Social History Narrative  . Not on file   Social Determinants of Health   Financial Resource Strain: Not on file  Food Insecurity: Not on file  Transportation Needs: Not on file  Physical Activity: Not on file  Stress: Not on file  Social Connections: Not on file    Review of Systems: A 12 point ROS discussed and pertinent positives are indicated in the HPI above.  All other systems are negative.  Review of Systems  Constitutional: Positive for activity change and fatigue. Negative for fever.  Respiratory: Positive for cough and shortness of breath.   Cardiovascular: Negative for chest pain.  Musculoskeletal: Positive for back pain.  Neurological: Positive for weakness.  Psychiatric/Behavioral: Negative for behavioral problems and confusion.    Vital Signs: BP 123/80 (BP Location: Right Leg)   Pulse (!) 110   Temp 98.8 F (37.1 C)   Resp 18   Ht 5\' 1"  (1.549 m)   Wt 92 lb 13 oz (42.1 kg)   LMP  (LMP Unknown)   SpO2 96%   BMI 17.54 kg/m   Physical Exam Vitals reviewed.  HENT:     Mouth/Throat:     Mouth: Mucous membranes are moist.  Cardiovascular:     Rate and Rhythm: Normal rate and regular rhythm.     Heart sounds: Normal heart sounds.  Pulmonary:     Effort: No respiratory distress.     Breath sounds: Wheezing present.  Abdominal:     Palpations: Abdomen is soft.  Musculoskeletal:        General: Normal range of motion.  Skin:    General: Skin is warm.  Neurological:     Mental Status: She is alert and oriented to person, place, and time.  Psychiatric:        Behavior: Behavior normal.     Imaging: CT CHEST WO CONTRAST  Result Date: 12/16/2019 CLINICAL DATA:  Persistent abnormal x-ray. Shortness of breath and chest pain. A CT scan from November 03, 2019 demonstrated signs of probable  septic emboli. EXAM: CT CHEST WITHOUT CONTRAST TECHNIQUE: Multidetector CT imaging of the chest was performed following the standard protocol without IV contrast. COMPARISON:  Chest x-ray December 11, 2019. CT scan of the chest November 03, 2019. FINDINGS: Cardiovascular: No significant vascular findings. Normal heart size. No pericardial effusion. Mediastinum/Nodes: A right PICC line terminates near the caval atrial junction. A feeding tube terminates below today's film. Bilateral pleural effusions are larger in the interval, small on the right and small to moderate on the left. Portions of the left effusion appear to be loculated in non dependent locations. No pericardial effusion definitely seen. Evaluation for adenopathy is limited due to lack of contrast but no definitive adenopathy identified. No definitive thyroid or esophageal abnormalities noted. Lungs/Pleura: Central airways are normal. No pneumothorax. Numerous bilateral pulmonary nodules are identified, many of which  are cavitated. Several the nodule seen previously are smaller in the interval. However, there are numerous new and larger nodules as well. New pleural effusions are identified with associated atelectasis. Upper Abdomen: No acute abnormality. Musculoskeletal: No chest wall mass or suspicious bone lesions identified. IMPRESSION: 1. Numerous bilateral pulmonary nodules and masslike regions, several of which are cavitated. Several of the previously identified nodules are smaller in the interval but there are other nodules which are clearly new or larger. Given the history of heroin use, this likely represents septic emboli. Other atypical infections could have a similar appearance but are considered less likely given history. 2. Bilateral pleural effusions are larger in the interval, small on the right and small to moderate on the left. Portions of the left effusion appear to be loculated. 3. No other changes. Electronically Signed   By: Gerome Sam III M.D   On: 12/16/2019 13:55   CT CHEST W CONTRAST  Result Date: 01/03/2020 CLINICAL DATA:  Suspected loculated pneumothorax, evaluate for bronchopleural fistula EXAM: CT CHEST WITH CONTRAST TECHNIQUE: Multidetector CT imaging of the chest was performed during intravenous contrast administration. CONTRAST:  69mL OMNIPAQUE IOHEXOL 300 MG/ML  SOLN COMPARISON:  Chest radiograph dated 01/03/2020. CT chest dated 12/16/2019 FINDINGS: Cardiovascular: The heart is normal in size. Trace inferior pericardial fluid. No evidence of thoracic aortic aneurysm. Right arm PICC terminates in the upper right atrium. Mediastinum/Nodes: Small mediastinal lymph nodes which do not meet pathologic CT size criteria. Visualized thyroid is unremarkable. Lungs/Pleura: Multiple patchy/nodular opacities in the lungs bilaterally, some of which are cavitary, improved when compared to the prior. For example, a 17 x 11 mm nodule in the posterior right upper lobe (series 4/image 35), previously measured 22 x 16 mm. A 19 x 9 mm nodule in the medial left upper lobe (series 4/image 62) previously measured 20 x 13 mm. This appearance continues to favor multifocal pneumonia on the basis of septic emboli. Moderate gas and fluid collection in the left pleural space, previously reflecting pleural fluid. Suspected direct communication via a dilated terminal bronchiole in the left anterior lingula (series 4/images 70-71), reflecting a bronchopleural fistula. Mild patchy opacities in the left lower lobe, atelectasis versus pneumonia. Trace right pleural effusion. Upper Abdomen: Visualized upper abdomen is grossly unremarkable. Musculoskeletal: Visualized osseous structures are within normal limits. IMPRESSION: Improving multifocal pneumonia, likely on the basis of septic emboli. 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 Trace right pleural effusion. Electronically Signed   By: Charline Bills M.D.   On: 01/03/2020 13:08   MR CERVICAL SPINE W WO CONTRAST  Result Date: 12/08/2019 CLINICAL DATA:  Intravenous drug abuse. Staph infection. Bacteremia. Back pain. EXAM: MRI CERVICAL SPINE WITHOUT AND WITH CONTRAST TECHNIQUE: Multiplanar and multiecho pulse sequences of the cervical spine, to include the craniocervical junction and cervicothoracic junction, were obtained without and with intravenous contrast. CONTRAST:  71mL GADAVIST GADOBUTROL 1 MMOL/ML IV SOLN COMPARISON:  None. FINDINGS: Alignment: Normal Vertebrae: Normal. No sign of osteomyelitis, discitis or septic facet arthritis. Cord: Normal Posterior Fossa, vertebral arteries, paraspinal tissues: Normal Disc levels: Normal. No disc degeneration. No stenosis of the canal or foramina. IMPRESSION: Normal cervical spine MRI. No evidence of spinal infection. Electronically Signed   By: Paulina Fusi M.D.   On: 12/08/2019 15:41   MR THORACIC SPINE W WO CONTRAST  Result Date: 12/08/2019 CLINICAL DATA:  IV drug abuse.  Staph infection.  Back pain. EXAM: MRI THORACIC WITHOUT AND  WITH CONTRAST TECHNIQUE: Multiplanar and multiecho pulse sequences of the thoracic spine were obtained without and with intravenous contrast. CONTRAST:  20mL GADAVIST GADOBUTROL 1 MMOL/ML IV SOLN COMPARISON:  11/04/2019 FINDINGS: MRI THORACIC SPINE FINDINGS Alignment:  Normal Vertebrae: Normal Cord:  Normal Paraspinal and other soft tissues: Bilateral pleural effusions. Bilateral lung disease with infiltrate and areas of cavitation. Disc levels: No evidence of disc degeneration or disc space infection. Wide patency of the canal and foramina. No evidence of osteomyelitis or septic facet arthritis. IMPRESSION: 1. Normal MRI of the thoracic spine. No evidence of spinal infection. 2. Bilateral pleural effusions. Bilateral lung disease with infiltrate and areas of cavitation. Electronically Signed   By: Paulina Fusi M.D.   On: 12/08/2019 15:50   MR Lumbar Spine W Wo  Contrast  Result Date: 12/22/2019 CLINICAL DATA:  29 year old female with sepsis, MRSA bacteremia, endocarditis. Continued back pain, but no evidence of spinal infection on 12/08/2019 total spine MRI. EXAM: MRI LUMBAR SPINE WITHOUT AND WITH CONTRAST TECHNIQUE: Multiplanar and multiecho pulse sequences of the lumbar spine were obtained without and with intravenous contrast. CONTRAST:  70mL GADAVIST GADOBUTROL 1 MMOL/ML IV SOLN COMPARISON:  Total spine MRI 12/08/2019, and earlier. FINDINGS: Segmentation: Lumbar segmentation appears to be normal and will be designated as such for this report. Alignment:  Stable lumbar lordosis. No spondylolisthesis. Vertebrae: Generalized decreased T1 and T2 marrow signal in the visible spine and pelvis, probably due to red marrow reactivation. No marrow edema or evidence of acute osseous abnormality. Visible SI joints appear normal. Conus medullaris and cauda equina: Conus extends to the L1 level. No lower spinal cord or conus signal abnormality. No abnormal intradural enhancement. No dural thickening. Normal cauda equina nerve roots. Paraspinal and other soft tissues: Suggestion of anasarca, body wall edema. Negative visible abdominal viscera. No focal lumbar paraspinal soft tissue inflammation or enhancement. Disc levels: Intervertebral disc signal and morphology remains normal from T11-T12 through L5-S1. No significant degenerative changes. No spinal or foraminal stenosis. IMPRESSION: 1. Stable, Negative MRI appearance of the Lumbar Spine aside from probable red marrow reactivation throughout the visible spine and pelvis. 2. Suggestion of generalized body wall edema, anasarca. Electronically Signed   By: Odessa Fleming M.D.   On: 12/22/2019 15:47   MR Lumbar Spine W Wo Contrast  Result Date: 12/08/2019 CLINICAL DATA:  IV drug abuse.  Staph infection.  Back pain. EXAM: MRI LUMBAR SPINE WITHOUT AND WITH CONTRAST TECHNIQUE: Multiplanar and multiecho pulse sequences of the lumbar spine  were obtained without and with intravenous contrast. CONTRAST:  56mL GADAVIST GADOBUTROL 1 MMOL/ML IV SOLN COMPARISON:  11/04/2019 FINDINGS: Segmentation:  5 lumbar type vertebral bodies. Alignment:  Normal Vertebrae:  Vertebral bodies are normal. Conus medullaris and cauda equina: Conus extends to the L1-2 level. Conus and cauda equina appear normal. Paraspinal and other soft tissues: Negative Disc levels: No evidence of degenerative disc disease or disc space infection. Minimal arthritic change at the L4-5 level, without any evidence of progression or pronounced finding to lend any significant suspicion of septic facet arthritis. IMPRESSION: No evidence of spinal infection. Minimal arthritic change at the L4-5 level. Lack of any progressive change since October argues against infection. Electronically Signed   By: Paulina Fusi M.D.   On: 12/08/2019 15:48   US Abdomen Complete  Result Date: 12/13/2019 CLINICAL DATA:  Elevated LFTs EXAM: ABDOMEN ULTRASOUND COMPLETE COMPARISON:  None. FINDINGS: Gallbladder: Gallbladder is well distended with tumefactive sludge identified. No wall thickening or pericholecystic fluid is noted. No  gallstones are seen. Common bile duct: Diameter: 4.2 mm. Liver: No focal lesion identified. Within normal limits in parenchymal echogenicity. Portal vein is patent on color Doppler imaging with normal direction of blood flow towards the liver. IVC: No abnormality visualized. Pancreas: Visualized portion unremarkable. Spleen: Spleen is elongated but demonstrates its normal spleniform shape. Calculated volume is within normal limits. Right Kidney: Length: 12.2 cm. Echogenicity within normal limits. No mass or hydronephrosis visualized. Left Kidney: Length: 10.6 cm. Echogenicity within normal limits. No mass or hydronephrosis visualized. Abdominal aorta: No aneurysm visualized. Other findings: Mild ascites is noted. Small peripancreatic lymph nodes are noted but not significant by size  criteria IMPRESSION: Gallbladder sludge without complicating factors. Elongated spleen with normal shape and normal volume. Mild ascites. Electronically Signed   By: Alcide CleverMark  Lukens M.D.   On: 12/13/2019 00:07   DG CHEST PORT 1 VIEW  Result Date: 01/03/2020 CLINICAL DATA:  Pleuritic chest pain.  History of drug overdose EXAM: PORTABLE CHEST 1 VIEW COMPARISON:  12/11/2019 FINDINGS: Right PICC with tip at the upper right atrium. Patchy pulmonary opacity with cavitary features as previously demonstrated. In place of loculated pleural fluid at the lateral left chest there is now an ovoid lucent area. Normal heart size. Probable splenomegaly IMPRESSION: Lucent area in the lateral left chest where there was previously loculated pleural fluid; suspect loculated pneumothorax. Consider chest CT to evaluate for bronchopleural fistula. Bilateral cavitary pneumonia which has likely improved. Electronically Signed   By: Marnee SpringJonathon  Watts M.D.   On: 01/03/2020 08:52   DG CHEST PORT 1 VIEW  Result Date: 12/11/2019 CLINICAL DATA:  Shortness of breath EXAM: PORTABLE CHEST 1 VIEW COMPARISON:  Chest radiograph dated 12/05/2019. FINDINGS: The cardiac silhouette is unchanged. There is increased consolidation/opacity of the lower lateral left lung. Moderate patchy bilateral airspace opacities are unchanged. A left pleural effusion likely contributes. There is no right pleural effusion. There is no pneumothorax. An enteric tube enters the stomach and terminates below the field of view. IMPRESSION: 1. Increased consolidation/opacity of the lower lateral left lung. A left pleural effusion likely contributes. 2. Unchanged moderate patchy bilateral airspace opacities. Electronically Signed   By: Romona Curlsyler  Litton M.D.   On: 12/11/2019 14:29   DG Abd Portable 1V  Result Date: 12/11/2019 CLINICAL DATA:  Abdominal pain and distension. EXAM: PORTABLE ABDOMEN - 1 VIEW COMPARISON:  Abdominal radiograph dated 12/05/2019. FINDINGS: An enteric  tube terminates in the stomach. There is a nonobstructive bowel gas pattern. No radiopaque calculi are noted overlying the urinary tract. IMPRESSION: Enteric tube terminates in the stomach. Electronically Signed   By: Romona Curlsyler  Litton M.D.   On: 12/11/2019 14:30   ECHOCARDIOGRAM LIMITED  Result Date: 12/12/2019    ECHOCARDIOGRAM LIMITED REPORT   Patient Name:   Amber CaroliRICA L Mair Date of Exam: 12/12/2019 Medical Rec #:  [id number]        Height:       62.0 in Accession #:    [id number]       Weight:       113.5 lb Date of Birth:  04/20/1990         BSA:          1.503 m Patient Age:    29 years         BP:           139/100 mmHg Patient Gender: F                HR:  100 bpm. Exam Location:  Inpatient Procedure: Limited Echo, Color Doppler and Cardiac Doppler Indications:    I36.9 Nonrheumatic tricuspid valve disorder, unspecified  History:        Patient has prior history of Echocardiogram examinations, most                 recent 12/05/2019. Risk Factors:IVDU. Angiovac debulking of                 tricuspid valve endocarditis 12/05/19.  Sonographer:    Irving Burton Senior RDCS Referring Phys: [id number] CHRISTOPHER P DANFORD  Sonographer Comments: Limited to evaluate TR and EF IMPRESSIONS  1. Left ventricular ejection fraction, by estimation, is 55 to 60%. The left ventricle has normal function.  2. Right ventricular systolic function is mildly reduced. The right ventricular size is moderately enlarged. There is normal pulmonary artery systolic pressure. The estimated right ventricular systolic pressure is 28.8 mmHg  3. A small pericardial effusion is present.  4. The inferior vena cava is normal in size with <50% respiratory variability, suggesting right atrial pressure of 8 mmHg.  5. Compared to prior echo, large vegetation on septal leaflet of tricuspid valve is no longer seen. However there remains poor coaptation of tricuspid valve leaflets. Tricuspid valve regurgitation is severe. FINDINGS  Left Ventricle: Left  ventricular ejection fraction, by estimation, is 55 to 60%. The left ventricle has normal function. Right Ventricle: The right ventricular size is moderately enlarged. Right ventricular systolic function is mildly reduced. There is normal pulmonary artery systolic pressure. The tricuspid regurgitant velocity is 2.28 m/s, and with an assumed right atrial pressure of 8 mmHg, the estimated right ventricular systolic pressure is 28.8 mmHg. Pericardium: A small pericardial effusion is present. Mitral Valve: The mitral valve is normal in structure. Tricuspid Valve: The tricuspid valve is abnormal. Tricuspid valve regurgitation is severe. Aortic Valve: Aortic valve regurgitation is not visualized. Pulmonic Valve: The pulmonic valve was not well visualized. Pulmonic valve regurgitation is not visualized. Venous: The inferior vena cava is normal in size with less than 50% respiratory variability, suggesting right atrial pressure of 8 mmHg. TRICUSPID VALVE TR Peak grad:   20.8 mmHg TR Vmax:        228.00 cm/s Epifanio Lesches MD Electronically signed by Epifanio Lesches MD Signature Date/Time: 12/12/2019/5:25:31 PM    Final    [name] EKG SITE RITE  Result Date: 12/13/2019 If Site Rite image not attached, placement could not be confirmed due to current cardiac rhythm.   Labs:  CBC: Recent Labs    12/19/19 0315 12/20/19 0416 12/24/19 0325 01/04/20 0325  WBC 6.5 6.7 6.6 11.4*  HGB 7.8* 8.2* 7.6* 9.1*  HCT 27.2* 28.2* 25.8* 29.4*  PLT 179 187 257 224    COAGS: Recent Labs    11/03/19 0950 11/04/19 0434 11/30/19 1226 01/05/20 0800  INR 1.3* 1.5* 1.7* 1.3*  APTT 32  --  34  --     BMP: Recent Labs    01/10/19 2200 11/03/19 0950 12/16/19 0225 12/24/19 0325 12/28/19 0530 01/04/20 0325  NA 128*   < > 133* 136 136 133*  K 3.8   < > 4.7 3.8 4.0 3.7  CL 94*   < > 98 100 97* 97*  CO2 22   < > 30 28 31 28   GLUCOSE 185*   < > 102* 76 124* 87  BUN 20   < > <5* 6 8 10   CALCIUM 8.9   < >  8.3* 8.5* 8.8* 8.7*  CREATININE 0.86   < > 0.32* 0.35* 0.41* 0.50  GFRNONAA >60   < > >60 >60 >60 >60  GFRAA >60  --   --   --   --   --    < > = values in this interval not displayed.    LIVER FUNCTION TESTS: Recent Labs    12/12/19 0047 12/13/19 0039 12/15/19 0814 12/16/19 0225  BILITOT 1.4* 1.3* 1.0 0.8  AST 319* 305* 71* 52*  ALT 79* 87* 49* 42  ALKPHOS 147* 226* 140* 122  PROT 5.8* 6.3* 5.7* 5.7*  ALBUMIN 2.2* 2.3* 2.0* 2.0*    TUMOR MARKERS: No results for input(s): AFPTM, CEA, CA199, CHROMGRNA in the last 8760 hours.  Assessment and Plan:  Left hydropneumothorax; bronchopleural fistula Scheduled for Chest tube placement in IR Pt is aware of procedure benefits and risks Including but not limited to Infection; bleeding; damage to surrounding structure Agreeable to proceed Consent signed and in chart  Thank you for this interesting consult.  I greatly enjoyed meeting Amber [name] and look forward to participating in their care.  A copy of this report was sent to the requesting provider on this date.  Electronically Signed: Robet Leu, PA-C 01/05/2020, 9:22 AM   I spent a total of 40 Minutes    in face to face in clinical consultation, greater than 50% of which was counseling/coordinating care for left chest tube

## 2020-01-06 MED ORDER — MORPHINE SULFATE (PF) 2 MG/ML IV SOLN
2.0000 mg | Freq: Once | INTRAVENOUS | Status: AC
Start: 2020-01-06 — End: 2020-01-06
  Administered 2020-01-06: 10:00:00 2 mg via INTRAVENOUS
  Filled 2020-01-06: qty 1

## 2020-01-06 MED ORDER — ESCITALOPRAM OXALATE 20 MG PO TABS
20.0000 mg | ORAL_TABLET | Freq: Every day | ORAL | Status: DC
  Administered 2020-01-06 – 2020-01-15 (×10): 20 mg via ORAL
  Filled 2020-01-06 (×10): qty 1

## 2020-01-06 MED ORDER — OXYCODONE HCL 5 MG PO TABS
15.0000 mg | ORAL_TABLET | Freq: Once | ORAL | Status: AC
  Administered 2020-01-06: 10:00:00 15 mg via ORAL
  Filled 2020-01-06: qty 3

## 2020-01-06 MED ORDER — OXYCODONE HCL 5 MG PO TABS
15.0000 mg | ORAL_TABLET | ORAL | Status: DC | PRN
  Administered 2020-01-06 – 2020-01-10 (×20): 15 mg via ORAL
  Filled 2020-01-06 (×20): qty 3

## 2020-01-06 NOTE — Plan of Care (Signed)
  Problem: Education: Goal: Knowledge of General Education information will improve Description Including pain rating scale, medication(s)/side effects and non-pharmacologic comfort measures Outcome: Progressing   

## 2020-01-06 NOTE — Progress Notes (Signed)
[facility] PROGRESS NOTE  Amber [name] ZOX:[id number] DOB: [birth year] DOA: 11/30/2019 PCP: Patient, No Pcp Per     12/15  Status: Remains inpatient appropriate because:Ongoing active pain requiring inpatient pain management, Ongoing diagnostic testing needed not appropriate for outpatient work up, Unsafe d/c plan, IV treatments appropriate due to intensity of illness or inability to take PO and Inpatient level of care appropriate due to severity of illness   Dispo: The patient is from: Home              Anticipated d/c is to: Home              Anticipated d/c date is: > 3 days (01/16/20)              Patient currently is not medically stable to d/c.  Patient's primary reason for remaining in the hospital is to complete 6 weeks of IV and oral antibiotics for her endocarditis with septic emboli.  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  Code Status: Full Family Communication: Patient; mother 12/21 and 12/23 DVT prophylaxis: SCDs 2/2 recent thrombocytopenia due to sepsis Vaccination status: Has not been vaccinated against Covid  Foley catheter: No  HPI: 29 year old female patient with known IV drug abuse with known tricuspid MRSA endocarditis who signed out AMA x2 prior to this admission.  She presented with progressive infection.  On 10/21 through 10/23 she was admitted with fever with blood cultures positive for MRSA.  CT of the chest demonstrated septic emboli and edematous L4-5 facet joint (? Evolving osteomyelitis) as well as tricuspid vegetation on CT.  She left AMA.  She returned 2 hours later on 10/23 with severe back pain.  Underwent TEE that demonstrated a 1 cm tricuspid valve mass.  She was evaluated by CT surgery who recommended medical management.  Again she left AMA on 10/25.  Patient was staying with friends but presented back to the hospital due to increasing  shortness of breath, weakness, fatigue malaise and nausea.  She arrived in the parking lot obtunded and was found to have multiple needles in her car on 11/17.  In the ER she was hypotensive and started on pressors.  Hemoglobin was low and she was given a blood transfusion.  She was also started on empiric IV vancomycin for known MRSA infection  and was admitted to the critical care service.  She eventually was taken to the OR for angio VAC during this admission.  She has subsequently transitioned out of ICU to stepdown unit.  She has had an IV infiltration and has a superficial wound on her left forearm. ID continues to follow regarding the endocarditis and recommends a total of 6 weeks therapy be daptomycin.  She has continued to have hypoxemia and productive cough and on 11/30 ID added doxycycline.  She is reporting pleuritic chest pain as well.  She is also very malnourished and was started on tube feedings and developed mild electrolyte disturbances from refeeding syndrome which have resolved.  She has low-grade resting tachycardia with no significant abnormalities on echo and cardiology previously did not suspect cardiac etiology to her tachycardia.  Hepatitis C antibody has been positive; she is HIV negative.  She has had persistent elevation in her LFTs.  She is currently on methadone 20 mg 3 times a day but will need to be on no more than 40 mg per 24 hours at time of discharge to  be eligible for outpatient methadone treatment.  Subjective: Porting inadequate pain control post pigtail chest tube placement the addition of oxycodone 10 mg q 4 hrs prn on top of her baseline methadone.  Also somewhat anxious.  Objective: Vitals:   01/05/20 1948 01/06/20 0507  BP: 113/65 (!) 120/59  Pulse: 93 95  Resp: 20 20  Temp: 98.4 F (36.9 C) 98.2 F (36.8 C)  SpO2: 98% 97%    Intake/Output Summary (Last 24 hours) at 01/06/2020 1234 Last data filed at 01/06/2020 0604 Gross per 24 hour  Intake 180 ml   Output 450 ml  Net -270 ml   Filed Weights   12/31/19 0647 01/02/20 0500 01/04/20 0624  Weight: 41.4 kg 41.4 kg 42.1 kg    Exam: General: Awake and reports pain in left chest wall after catheter placed on 12/23. Pulmonary: Sounds clear to auscultation with some splinting noted.  Pigtail catheter to close drainage system per left chest with serous fluid noted.  Able on room air with O2 sats 97 to 100% Cardiac: S1-S2 with no murmur.  Pulses regular and intermittently tachycardic.  No peripheral edema Abdomen: Soft, nontender nondistended. 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 Lightfoot's assistance.  Plan is to proceed with CT-guided chest tube placement today on 12/23; patient will be given one-time dose of Klonopin preprocedure to aid in anxiety -Patient is stable on room air but continues to have pleuritic chest discomfort -Place pigtail catheter placement to left chest on 12/23.  Small amount of fluid but large amount of air obtained.  Currently attached to closed collection system. -Anticipate will remain to water suction several days with eventual attempt to clamp tube and follow chest x-ray to see if pneumothorax recurs prior to removal -An adequate pain control and does have significant tolerance to narcotics  therefore will increase oxycodone to 15 mg every 4 hours prn-give one-time dose now along with IV morphine 2 mg x 1  Acute hypoxemic respiratory failure (multifactorial) 1 acute sepsis 2 septic emboli with sequela of small pleural effusion -Sepsis physiology and hypoxemia resolved -Continue oral doxycycline for sequela from septic pulmonary emboli-will DC at discharge -Continue supportive care for pleuritic chest discomfort; 12/21 due to reemergence of discomfort chest x-ray completed and does reveal loculated pleural fluid possibly loculated pneumothorax-augmentation made to obtain CT of the chest to evaluate for bronchopleural fistula -As of 12/14 has remained stable off of nasal cannula oxygen -Encourage use of incentive spirometry  Persistent tachycardia/tricuspid valve insufficiency -Evaluated by CVTS and currently not a surgical candidate for cardiac valve replacement giving active IVDA prior to current admission (although was appropriate for angiovac surgery) -Echocardiogram: mild systolic LV dysfunction -cardiology felt this was  physiologic response to hypoalbuminemia/malnutrition, deconditioning and anemia. -Cardiology recommended to suppress physiologic tachycardia with beta-blockers nor utilize diuretics prophylactically due to increased risk of harm   LBP -Musculoskeletal in etiology noting no significant abnormalities on recent MRI -Continue supportive care with ibuprofen  prn and lidocaine patch  Severe protein calorie malnutrition with associated refeeding syndrome -Initial BMI 17 patient briefly required tube feedings which have now been discontinued. -BMI continues to trend upward on regular diet with supplementation as below Ensure feeding supplements Nutrition Problem: Severe Malnutrition Etiology: acute illness (MRSA endocarditis) Signs/Symptoms: energy intake < or equal to 50% for > or equal to 5 days,percent weight loss (8.5% weight loss in less than 2 months) Percent  weight loss: 8.5 % Interventions: Ensure Enlive (each supplement provides 350kcal and 20 grams of protein),Magic cup,MVI Estimated body mass index is 17.54 kg/m as calculated from the following:   Height as of this encounter: 5\' 1"  (1.549 m).   Weight as of this encounter: 42.1 kg.  Skin wound secondary to IV infiltration/right groin wound/right neck incision     11/30/19                        12/20/19                         12/30/19  -ID also following along regarding this wound.  No indication to change current management and no indication to consult surgery -Dark eschar fell off on 12/10 as evidenced by pictures above wound continues to improve -Continue gabapentin 300 mg TID  Wound / Incision (Open or Dehisced) 12/10/19 Non-pressure wound Arm Left;Posterior;Lateral infiltration/phlebitis (Active)  Date First Assessed/Time First Assessed: 12/10/19 1915   Wound Type: Non-pressure wound  Location: Arm  Location Orientation: Left;Posterior;Lateral  Wound Description (Comments): infiltration/phlebitis    Assessments 12/10/2019 11:45 PM 01/06/2020  6:04 AM  Dressing Type Thin film;Non adherent;Gauze (Comment) Compression wrap  Dressing Changed New Changed  Dressing Status Intact --  Dressing Change Frequency PRN Daily  Site / Wound Assessment Painful;Red;Bleeding Painful;Pink;Red  Margins -- Unattached edges (unapproximated)  Closure None None  Drainage Amount Minimal --  Drainage Description Serosanguineous Serosanguineous  Treatment Other (Comment) Cleansed     No Linked orders to display    Chronic IV drug abuse with opiates/recent withdrawal syndrome -12/13: Discussed with patient that plan is to decrease to discharge dose of methadone 40 mg daily by next Monday 12/20 -Pam has been decreased to 0.25 mg BID and dose will be decreased to once daily today  GAD -As noted above weaning and decreasing benzodiazepine -Discussed with patient's mother and as I have also noted  patient has significant anxiety issues -12/10: Lexapro 10 mg HS initiated and as of 12/24 will increase to 20 mg -Given increased anxiety post chest tube placed will not complete Klonopin taper/DC this medication at this time      Other problems: Anemia of chronic disease/recent sepsis related thrombocytopenia -Thrombocytopenia has resolved -Hemoglobin stable around 8 -With initiation of NSAIDs daily Protonix has been started  Hyponatremia -Current sodium stable at 133  Hep C antibody positive with persistent transaminitis -Currently has nonobstructive transaminitis that has improved with improvement in nutrition -As of 12/3 albumin 2.0, AST 52, ALT normal at 42 and total bilirubin has been normal -HCVRNA quantitative pending   Data Reviewed: Basic Metabolic Panel: Recent Labs  Lab 01/04/20 0325  NA 133*  K 3.7  CL 97*  CO2 28  GLUCOSE 87  BUN 10  CREATININE 0.50  CALCIUM 8.7*   Liver Function Tests: No results for input(s): AST, ALT, ALKPHOS, BILITOT, PROT, ALBUMIN in the last 168 hours. No results for input(s): LIPASE, AMYLASE in the last 168 hours. No  results for input(s): AMMONIA in the last 168 hours. CBC: Recent Labs  Lab 01/04/20 0325  WBC 11.4*  HGB 9.1*  HCT 29.4*  MCV 88.8  PLT 224   Cardiac Enzymes: Recent Labs  Lab 01/05/20 0317  CKTOTAL 24*   BNP (last 3 results) Recent Labs    11/30/19 1809  BNP 583.0*    ProBNP (last 3 results) No results for input(s): PROBNP in the last 8760 hours.  CBG: No results for input(s): GLUCAP in the last 168 hours.  Recent Results (from the past 240 hour(s))  Body fluid culture     Status: None (Preliminary result)   Collection Time: 01/05/20  2:56 PM   Specimen: Pleura  Result Value Ref Range Status   Specimen Description PLEURAL FLUID  Final   Special Requests LEFT CHEST  Final   Gram Stain PENDING  Incomplete   Culture   Final    NO GROWTH < 24 HOURS Performed at [facility], [address], [place], [place] [address]    Report Status PENDING  Incomplete     Studies: CT [facility] PLEURAL DRAIN W/INDWELL CATH W/IMG GUIDE  Result Date: 01/05/2020 INDICATION: 29 year old with known IV drug abuse and tricuspid MRSA endocarditis. 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 CT surgery recommended placement of a image guided chest tube. EXAM: CT-GUIDED CHEST TUBE PLACEMENT MEDICATIONS: Moderate sedation ANESTHESIA/SEDATION: Fentanyl 50 mcg IV; Versed 1.0 mg IV Moderate Sedation Time:  23 minutes The patient was continuously monitored during the procedure by the interventional radiology nurse under my direct supervision. COMPLICATIONS: None immediate. PROCEDURE: Informed written consent was obtained from the patient after a thorough discussion of the procedural risks, benefits and alternatives. All questions were addressed. Maximal Sterile Barrier Technique was utilized including caps, mask, sterile gowns, sterile gloves, sterile drape, hand hygiene and skin antiseptic. A timeout was performed prior to the initiation of the procedure. Patient was placed supine on the CT scanner with the left side mildly elevated. CT images through the chest were obtained. Left mid axillary region was prepped with chlorhexidine and sterile field was created. Skin and soft tissues were anesthetized using 1% lidocaine. Small incision was made. Using CT guidance, a Yueh catheter was directed into the left pleural space. Gas was aspirated. A small amount of yellow fluid was aspirated. Superstiff Amplatz wire was advanced into the pleural space and the tract was dilated to accommodate a 14 [place] multipurpose drain. Catheter was attached to chest tube drainage system and attached to wall suction. Follow up CT images were obtained. Catheter was sutured to skin. FINDINGS: Again noted are numerous irregular opacities throughout both lungs and concerning for septic emboli. There is a large  air-fluid collection in the left lower chest and similar to the recent CT on 01/03/2020. Hydropneumothorax was slightly smaller in size on the post drain images. Gas within the subcutaneous tissues on the post drain images. IMPRESSION: CT-guided placement of a chest tube within the left hydropneumothorax. Electronically Signed   By: Richarda Overlie M.D.   On: 01/05/2020 18:46    Scheduled Meds: . chlorhexidine  15 mL Mouth Rinse BID  . Chlorhexidine Gluconate Cloth  6 each Topical Daily  . clonazepam  0.25 mg Oral Daily  . collagenase   Topical Daily  . diclofenac Sodium  4 g Topical QID  . doxycycline  100 mg Oral Q12H  . escitalopram  20 mg Oral QHS  . feeding supplement  237 mL  Oral TID BM  . gabapentin  300 mg Oral TID  . lidocaine  2 patch Transdermal Q24H  . mouth rinse  15 mL Mouth Rinse q12n4p  . methadone  20 mg Oral Q12H  . multivitamin with minerals  1 tablet Oral Daily  . pantoprazole  40 mg Oral Daily  . sodium chloride flush  10-40 mL Intracatheter Q12H  . sodium chloride flush  10-40 mL Intracatheter Q12H   Continuous Infusions: . sodium chloride 10 mL/hr at 12/05/19 0617  . sodium chloride Stopped (12/19/19 2156)  . DAPTOmycin (CUBICIN)  IV 500 mg (01/05/20 2109)    Principal Problem:   MRSA infection Active Problems:   Septic shock (HCC)   Opioid use with withdrawal (HCC)   Endocarditis of tricuspid valve   Palliative care encounter   Protein-calorie malnutrition, severe   Consultants:  PCCM  Infectious disease  CVTS  Cardiology  Palliative medicine  Procedures:  10/22 echocardiogram  10/24 TEE  11/17 complete echocardiogram  11/22 intraoperative TEE/[facility]  11/24 core track  11/29 Limited echocardiogram  Antibiotics: Anti-infectives (From admission, onward)   Start     Dose/Rate Route Frequency Ordered Stop   12/13/19 1115  doxycycline (VIBRA-TABS) tablet 100 mg        100 mg Oral Every 12 hours 12/13/19 1027 01/16/20  2359   12/07/19 2000  DAPTOmycin (CUBICIN) 500 mg in sodium chloride 0.9 % IVPB        500 mg 220 mL/hr over 30 Minutes Intravenous Daily 12/06/19 1345 01/16/20 2359   12/02/19 1400  vancomycin (VANCOREADY) IVPB 500 mg/100 mL  Status:  Discontinued        500 mg 100 mL/hr over 60 Minutes Intravenous Every 8 hours 12/02/19 1127 12/02/19 1131   12/02/19 1400  vancomycin (VANCOREADY) IVPB 750 mg/150 mL  Status:  Discontinued        750 mg 150 mL/hr over 60 Minutes Intravenous Every 8 hours 12/02/19 1131 12/06/19 1339   12/01/19 1200  vancomycin (VANCOREADY) IVPB 500 mg/100 mL  Status:  Discontinued        500 mg 100 mL/hr over 60 Minutes Intravenous Every 24 hours 11/30/19 1454 12/01/19 0946   12/01/19 1200  vancomycin (VANCOCIN) IVPB 1000 mg/200 mL premix  Status:  Discontinued        1,000 mg 200 mL/hr over 60 Minutes Intravenous Every 24 hours 12/01/19 0946 12/02/19 1127   11/30/19 1800  piperacillin-tazobactam (ZOSYN) IVPB 3.375 g  Status:  Discontinued        3.375 g 12.5 mL/hr over 240 Minutes Intravenous Every 8 hours 11/30/19 1454 12/02/19 0234   11/30/19 1115  piperacillin-tazobactam (ZOSYN) IVPB 3.375 g        3.375 g 100 mL/hr over 30 Minutes Intravenous  Once 11/30/19 1110 11/30/19 1306   11/30/19 1115  vancomycin (VANCOCIN) IVPB 1000 mg/200 mL premix        1,000 mg 200 mL/hr over 60 Minutes Intravenous  Once 11/30/19 1110 11/30/19 1337       Time spent: 20 minutes    Junious SilkAllison Dantrell Schertzer ANP  [facility] 7 am - 330 pm/M-F 37 days

## 2020-01-07 MED ORDER — MORPHINE SULFATE (PF) 2 MG/ML IV SOLN
2.0000 mg | Freq: Once | INTRAVENOUS | Status: AC
  Administered 2020-01-07: 13:00:00 2 mg via INTRAVENOUS
  Filled 2020-01-07: qty 1

## 2020-01-07 NOTE — Progress Notes (Signed)
PROGRESS NOTE    Amber [name]  NUU:[id number] DOB: 20-Oct-1990 DOA: 11/30/2019 PCP: Patient, No Pcp Per   Brief Narrative:  29 year old female patient with known IV drug abuse with known tricuspid MRSA endocarditis who signed out AMA x2 prior to this admission.  She presented with progressive infection.  On 10/21 through 10/23 she was admitted with fever with blood cultures positive for MRSA.  CT of the chest demonstrated septic emboli and edematous L4-5 facet joint (? Evolving osteomyelitis) as well as tricuspid vegetation on CT.  She left AMA.  She returned 2 hours later on 10/23 with severe back pain.  Underwent TEE that demonstrated a 1 cm tricuspid valve mass.  She was evaluated by CT surgery who recommended medical management.  Again she left AMA on 10/25.  Patient was staying with friends but presented back to the hospital due to increasing shortness of breath, weakness, fatigue malaise and nausea.  She arrived in the parking lot obtunded and was found to have multiple needles in her car on 11/17.  In the ER she was hypotensive and started on pressors.  Hemoglobin was low and she was given a blood transfusion.  She was also started on empiric IV vancomycin for known MRSA infection  and was admitted to the critical care service.  She eventually was taken to the OR for angio VAC during this admission.  She has subsequently transitioned out of ICU to stepdown unit.  She has had an IV infiltration and has a superficial wound on her left forearm. ID continues to follow regarding the endocarditis and recommends a total of 6 weeks therapy be daptomycin.  She has continued to have hypoxemia and productive cough and on 11/30 ID added doxycycline.  She is reporting pleuritic chest pain as well.  She is also very malnourished and was started on tube feedings and developed mild electrolyte disturbances from refeeding syndrome which have resolved.  She has low-grade resting tachycardia with no significant  abnormalities on echo and cardiology previously did not suspect cardiac etiology to her tachycardia.  Hepatitis C antibody has been positive; she is HIV negative.  She has had persistent elevation in her LFTs.  She is currently on methadone 20 mg 3 times a day but will need to be on no more than 40 mg per 24 hours at time of discharge to be eligible for outpatient methadone treatment.  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  Patient complaining of constant pain this morning.  Will add morphine 2 mg IV once.  Acute hypoxemic respiratory failure (multifactorial) -In the setting of acute sepsis, septic emboli with sequelae of small pleural effusion -CT chest from 12/21 showed improving multifocal pneumonia.  Moderate left hydropneumothorax on the basis of bronchopleural fistula.  Trace right pleural effusion. -Continue oral doxycycline-will DC at discharge.  Currently on room air.  Continue incentive spirometry.  Persistent tachycardia/tricuspid valve insufficiency -Evaluated by CVTS and currently not a surgical candidate for cardiac valve replacement giving active IVDA  prior to current admission (although was appropriate for angiovac surgery) -Echocardiogram: mild systolic LV dysfunction -cardiology felt this was  physiologic response to hypoalbuminemia/malnutrition, deconditioning and anemia.  Severe protein calorie  malnutrition with associated refeeding syndrome -Initial BMI 17 patient briefly required tube feedings which have now been discontinued. -Continue multivitamin and feeding supplements.  Skin wound secondary to IV infiltration/right groin wound/right neck incision -ID also following along regarding this wound.  No indication to change current management and no indication to consult surgery -Continue gabapentin 300 mg TID   Chronic IV drug abuse with opiates/recent withdrawal syndrome -12/13: Discussed with patient that plan is to decrease to discharge dose of methadone 40 mg daily by next Monday 12/20  GAD -Continue Lexapro and Klonopin  Anemia of chronic disease/recent sepsis related thrombocytopenia -Thrombocytopenia has resolved -Hemoglobin stable around 8  Hyponatremia -Current sodium stable at 133  Hep C antibody positive with persistent transaminitis -As of 12/3 albumin 2.0, AST 52, ALT normal at 42 and total bilirubin has been normal -HCVRNA quantitative 5.029/107000  DVT prophylaxis: SCD, no chemical anticoagulation due to thrombocytopenia in the setting of sepsis Code Status: Full code Family Communication:  None present at bedside.  Plan of care discussed with patient in length and she verbalized understanding and agreed with it. Disposition Plan: To be determined Consultants:   ID  Cardiothoracic surgery  Cardiology  Procedures:   Chest tube placement  Antimicrobials:   Daptomycin  Doxycycline  Status is: Inpatient  Remains inpatient appropriate because:Ongoing active pain requiring inpatient pain management   Dispo: The patient is from: Home              Anticipated d/c is to: Home              Anticipated d/c date is: > 3 days              Patient currently is not medically stable to d/c.         Subjective: Patient seen and examined.  Complaining of pain at chest tube site.  Tells me that oxycodone 15mg  every 4 hour helps me for short  time.  She is sleepy but very pleasant and denies any other complaints.  Objective: Vitals:   01/06/20 0507 01/06/20 1406 01/06/20 2017 01/07/20 0452  BP: (!) 120/59 106/68 (!) 100/57 (!) 129/112  Pulse: 95 (!) 101 100 91  Resp: 20 19 17 18   Temp: 98.2 F (36.8 C) 98.2 F (36.8 C) 98.4 F (36.9 C) 97.6 F (36.4 C)  TempSrc:  Oral Oral Oral  SpO2: 97% 99% 100% 99%  Weight:      Height:        Intake/Output Summary (Last 24 hours) at 01/07/2020 1115 Last data filed at 01/06/2020 2037 Gross per 24 hour  Intake 452 ml  Output --  Net 452 ml   Filed Weights   12/31/19 0647 01/02/20 0500 01/04/20 0624  Weight: 41.4 kg 41.4 kg 42.1 kg    Examination:  General exam: Appears calm and comfortable, on room air, thin and lean, communicating well Respiratory system: Clear to auscultation. Respiratory effort normal.  Pigtail catheter to close drainage noted. Cardiovascular system: S1 & S2 heard, RRR. No JVD, murmurs, rubs, gallops or clicks. No pedal edema. Gastrointestinal system: Abdomen is nondistended, soft and nontender. No organomegaly or masses felt. Normal bowel sounds heard. Central nervous system: Alert and oriented. No focal neurological deficits. Extremities: Left upper extremity: Dressing dry and intact. Skin: No rashes, lesions or ulcers  Psychiatry: Judgement and insight appear normal. Mood & affect appropriate.    Data Reviewed: I have personally reviewed following labs and imaging studies  CBC: Recent Labs  Lab 01/04/20 0325  WBC 11.4*  HGB 9.1*  HCT 29.4*  MCV 88.8  PLT 224   Basic Metabolic Panel: Recent Labs  Lab 01/04/20 0325  NA 133*  K 3.7  CL 97*  CO2 28  GLUCOSE 87  BUN 10  CREATININE 0.50  CALCIUM 8.7*   GFR: Estimated Creatinine Clearance: 69 mL/min (by C-G formula based on SCr of 0.5 mg/dL). Liver Function Tests: No results for input(s): AST, ALT, ALKPHOS, BILITOT, PROT, ALBUMIN in the last 168 hours. No results for input(s):  LIPASE, AMYLASE in the last 168 hours. No results for input(s): AMMONIA in the last 168 hours. Coagulation Profile: Recent Labs  Lab 01/05/20 0800  INR 1.3*   Cardiac Enzymes: Recent Labs  Lab 01/05/20 0317  CKTOTAL 24*   BNP (last 3 results) No results for input(s): PROBNP in the last 8760 hours. HbA1C: No results for input(s): HGBA1C in the last 72 hours. CBG: No results for input(s): GLUCAP in the last 168 hours. Lipid Profile: No results for input(s): CHOL, HDL, LDLCALC, TRIG, CHOLHDL, LDLDIRECT in the last 72 hours. Thyroid Function Tests: No results for input(s): TSH, T4TOTAL, FREET4, T3FREE, THYROIDAB in the last 72 hours. Anemia Panel: No results for input(s): VITAMINB12, FOLATE, FERRITIN, TIBC, IRON, RETICCTPCT in the last 72 hours. Sepsis Labs: No results for input(s): PROCALCITON, LATICACIDVEN in the last 168 hours.  Recent Results (from the past 240 hour(s))  Body fluid culture     Status: None (Preliminary result)   Collection Time: 01/05/20  2:56 PM   Specimen: Pleura  Result Value Ref Range Status   Specimen Description PLEURAL FLUID  Final   Special Requests LEFT CHEST  Final   Gram Stain NO WBC SEEN NO ORGANISMS SEEN   Final   Culture   Final    NO GROWTH 2 DAYS Performed at [facility], [address] [place], [place] [address]    Report Status PENDING  Incomplete      Radiology Studies: CT [facility] PLEURAL DRAIN W/INDWELL CATH W/IMG GUIDE  Result Date: 01/05/2020 INDICATION: 29 year old with known IV drug abuse and tricuspid MRSA endocarditis. 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 CT surgery recommended placement of a image guided chest tube. EXAM: CT-GUIDED CHEST TUBE PLACEMENT MEDICATIONS: Moderate sedation ANESTHESIA/SEDATION: Fentanyl 50 mcg IV; Versed 1.0 mg IV Moderate Sedation Time:  23 minutes The patient was continuously monitored during the procedure by the interventional radiology nurse  under my direct supervision. COMPLICATIONS: None immediate. PROCEDURE: Informed written consent was obtained from the patient after a thorough discussion of the procedural risks, benefits and alternatives. All questions were addressed. Maximal Sterile Barrier Technique was utilized including caps, mask, sterile gowns, sterile gloves, sterile drape, hand hygiene and skin antiseptic. A timeout was performed prior to the initiation of the procedure. Patient was placed supine on the CT scanner with the left side mildly elevated. CT images through the chest were obtained. Left mid axillary region was prepped with chlorhexidine and sterile field was created. Skin and soft tissues were anesthetized using 1% lidocaine. Small incision was made. Using CT guidance, a Yueh catheter was directed into the left pleural space. Gas was aspirated. A small amount of yellow fluid was aspirated. Superstiff Amplatz wire was advanced into the pleural space and the tract was  dilated to accommodate a 14 [place] multipurpose drain. Catheter was attached to chest tube drainage system and attached to wall suction. Follow up CT images were obtained. Catheter was sutured to skin. FINDINGS: Again noted are numerous irregular opacities throughout both lungs and concerning for septic emboli. There is a large air-fluid collection in the left lower chest and similar to the recent CT on 01/03/2020. Hydropneumothorax was slightly smaller in size on the post drain images. Gas within the subcutaneous tissues on the post drain images. IMPRESSION: CT-guided placement of a chest tube within the left hydropneumothorax. Electronically Signed   By: Richarda OverlieAdam  Henn M.D.   On: 01/05/2020 18:46    Scheduled Meds: . chlorhexidine  15 mL Mouth Rinse BID  . Chlorhexidine Gluconate Cloth  6 each Topical Daily  . clonazepam  0.25 mg Oral Daily  . collagenase   Topical Daily  . diclofenac Sodium  4 g Topical QID  . doxycycline  100 mg Oral Q12H  . escitalopram  20  mg Oral QHS  . feeding supplement  237 mL Oral TID BM  . gabapentin  300 mg Oral TID  . lidocaine  2 patch Transdermal Q24H  . mouth rinse  15 mL Mouth Rinse q12n4p  . methadone  20 mg Oral Q12H  .  morphine injection  2 mg Intravenous Once  . multivitamin with minerals  1 tablet Oral Daily  . pantoprazole  40 mg Oral Daily  . sodium chloride flush  10-40 mL Intracatheter Q12H  . sodium chloride flush  10-40 mL Intracatheter Q12H   Continuous Infusions: . sodium chloride 10 mL/hr at 12/05/19 0617  . sodium chloride Stopped (12/19/19 2156)  . DAPTOmycin (CUBICIN)  IV 500 mg (01/06/20 1957)     LOS: 38 days   Time spent: 35 minutes  Melrose Kearse Estill Cotta Jamesa Tedrick, MD [facility]  If 7PM-7AM, please contact night-coverage [email or web address] 01/07/2020, 11:15 AM

## 2020-01-08 ENCOUNTER — Inpatient Hospital Stay (HOSPITAL_COMMUNITY): Payer: Self-pay

## 2020-01-08 LAB — BODY FLUID CULTURE
Culture: NO GROWTH
Gram Stain: NONE SEEN

## 2020-01-08 IMAGING — DX DG CHEST 1V PORT
1 series · 1 of 1 positions shown · non-contrast
Comparison: Five days ago

CLINICAL DATA: Evaluate pleural fluid and pneumothorax

EXAM:
PORTABLE CHEST 1 VIEW

[chest]
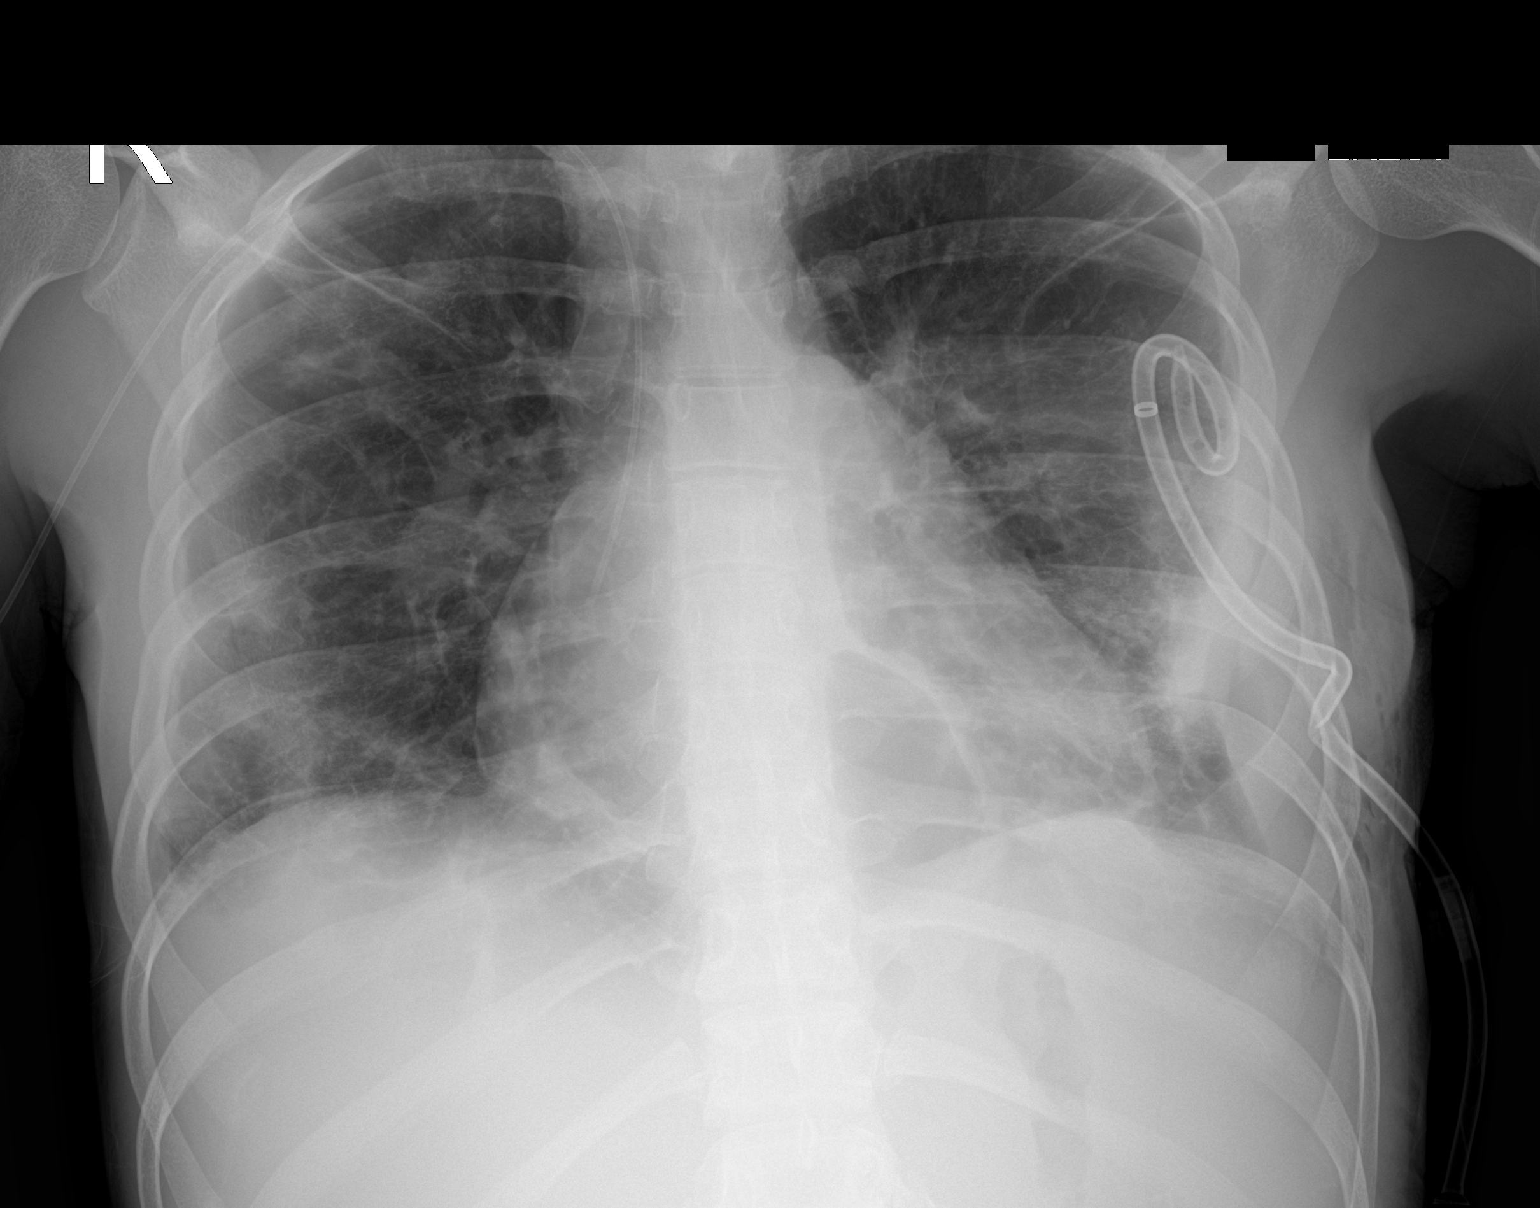

[1 of 1 positions shown; findings below may reference images not displayed]

FINDINGS: Chest drain on the left with no visible residual gas collection.
There is adjacent pleural fluid/thickening along the lateral chest
wall. Multiple pulmonary nodules, some cavitary (as along the
paramediastinal left lower lobe). No apical pneumothorax. Stable
heart size. Right PICC with tip at the upper cavoatrial junction.
IMPRESSION: Improvement in fluid collection in the left chest after percutaneous
drainage. No new abnormality.

## 2020-01-08 MED ORDER — MORPHINE SULFATE (PF) 2 MG/ML IV SOLN
2.0000 mg | Freq: Once | INTRAVENOUS | Status: AC
  Administered 2020-01-08: 04:00:00 2 mg via INTRAVENOUS
  Filled 2020-01-08: qty 1

## 2020-01-08 MED ORDER — MORPHINE SULFATE (PF) 2 MG/ML IV SOLN
2.0000 mg | Freq: Once | INTRAVENOUS | Status: AC
Start: 2020-01-08 — End: 2020-01-08
  Administered 2020-01-08: 09:00:00 2 mg via INTRAVENOUS
  Filled 2020-01-08: qty 1

## 2020-01-08 NOTE — Progress Notes (Signed)
PROGRESS NOTE    Amber [name]  DDU:[id number] DOB: [birth year] DOA: 11/30/2019 PCP: Patient, No Pcp Per   Brief Narrative:  29 year old female patient with known IV drug abuse with known tricuspid MRSA endocarditis who signed out AMA x2 prior to this admission.  She presented with progressive infection.  On 10/21 through 10/23 she was admitted with fever with blood cultures positive for MRSA.  CT of the chest demonstrated septic emboli and edematous L4-5 facet joint (? Evolving osteomyelitis) as well as tricuspid vegetation on CT.  She left AMA.  She returned 2 hours later on 10/23 with severe back pain.  Underwent TEE that demonstrated a 1 cm tricuspid valve mass.  She was evaluated by CT surgery who recommended medical management.  Again she left AMA on 10/25.  Patient was staying with friends but presented back to the hospital due to increasing shortness of breath, weakness, fatigue malaise and nausea.  She arrived in the parking lot obtunded and was found to have multiple needles in her car on 11/17.  In the ER she was hypotensive and started on pressors.  Hemoglobin was low and she was given a blood transfusion.  She was also started on empiric IV vancomycin for known MRSA infection  and was admitted to the critical care service.  She eventually was taken to the OR for angio VAC during this admission.  She has subsequently transitioned out of ICU to stepdown unit.  She has had an IV infiltration and has a superficial wound on her left forearm. ID continues to follow regarding the endocarditis and recommends a total of 6 weeks therapy be daptomycin.  She has continued to have hypoxemia and productive cough and on 11/30 ID added doxycycline.  She is reporting pleuritic chest pain as well.  She is also very malnourished and was started on tube feedings and developed mild electrolyte disturbances from refeeding syndrome which have resolved.  She has low-grade resting tachycardia with no significant  abnormalities on echo and cardiology previously did not suspect cardiac etiology to her tachycardia.  Hepatitis C antibody has been positive; she is HIV negative.  She has had persistent elevation in her LFTs.  She is currently on methadone 20 mg 3 times a day but will need to be on no more than 40 mg per 24 hours at time of discharge to be eligible for outpatient methadone treatment.  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  -Patient complaining of 10 out of 10 pain this morning.  Will add morphine 2 mg IV once.  Acute hypoxemic respiratory failure (multifactorial) -In the setting of acute sepsis, septic emboli with sequelae of small pleural effusion -CT chest from 12/21 showed improving multifocal pneumonia.  Moderate left hydropneumothorax on the basis of bronchopleural fistula.  Trace right pleural effusion. -Continue oral doxycycline-will DC at discharge.  Currently on room air.  Continue incentive spirometry.  Persistent tachycardia/tricuspid valve insufficiency -Evaluated by CVTS and currently not a surgical candidate for cardiac valve replacement  giving active IVDA prior to current admission (although was appropriate for angiovac surgery) -Echocardiogram: mild systolic LV dysfunction -cardiology felt this was  physiologic response to hypoalbuminemia/malnutrition, deconditioning and anemia.  Severe protein calorie  malnutrition with associated refeeding syndrome -Initial BMI 17 patient briefly required tube feedings which have now been discontinued. -Continue multivitamin and feeding supplements.  Skin wound secondary to IV infiltration/right groin wound/right neck incision -ID also following along regarding this wound.  No indication to change current management and no indication to consult surgery -Continue gabapentin 300 mg TID   Chronic IV drug abuse with opiates/recent withdrawal syndrome -12/13: Discussed with patient that plan is to decrease to discharge dose of methadone 40 mg daily by next Monday 12/20  GAD -Continue Lexapro and Klonopin  Anemia of chronic disease/recent sepsis related thrombocytopenia -Thrombocytopenia has resolved -Hemoglobin stable around 8  Hyponatremia -Current sodium stable at 133  Hep C antibody positive with persistent transaminitis -As of 12/3 albumin 2.0, AST 52, ALT normal at 42 and total bilirubin has been normal -HCVRNA quantitative 5.029/107000  DVT prophylaxis: SCD, no chemical anticoagulation due to thrombocytopenia in the setting of sepsis Code Status: Full code Family Communication:  None present at bedside.  Plan of care discussed with patient in length and she verbalized understanding and agreed with it. Disposition Plan: To be determined Consultants:   ID  Cardiothoracic surgery  Cardiology  Procedures:   Chest tube placement  Antimicrobials:   Daptomycin  Doxycycline  Status is: Inpatient  Remains inpatient appropriate because:Ongoing active pain requiring inpatient pain management   Dispo: The patient is from: Home              Anticipated d/c is to: Home              Anticipated d/c date is: > 3 days              Patient currently is not medically stable to d/c.   Subjective: Patient seen and examined.  Resting comfortably on the bed.  Tells me that she has 10 out of 10 pain at her chest tube site.  She is  requesting for pain medications.  Other than pain she denies any other symptoms including shortness of breath, nausea, vomiting, palpitation, lightheadedness or dizziness. Objective: Vitals:   01/07/20 0452 01/07/20 1540 01/07/20 2002 01/08/20 0646  BP: (!) 129/112 118/68 (!) 106/57 110/65  Pulse: 91 (!) 101 (!) 108 99  Resp: 18 18 17 17   Temp: 97.6 F (36.4 C) 98.4 F (36.9 C) 98.7 F (37.1 C) 98.6 F (37 C)  TempSrc: Oral Oral Oral Oral  SpO2: 99%  100% 100%  Weight:      Height:        Intake/Output Summary (Last 24 hours) at 01/08/2020 1125 Last data filed at 01/08/2020 0650 Gross per 24 hour  Intake 537 ml  Output 1320 ml  Net -783 ml   Filed Weights   12/31/19 0647 01/02/20 0500 01/04/20 0624  Weight: 41.4 kg 41.4 kg 42.1 kg    Examination:  General exam: Appears calm and comfortable, on room air, thin and lean, communicating well, very pleasant. Respiratory system: Clear to auscultation. Respiratory effort normal.  Pigtail catheter to close drainage noted. Cardiovascular system: S1 & S2 heard, RRR. No JVD, murmurs, rubs, gallops or clicks. No pedal edema. Gastrointestinal system: Abdomen is nondistended, soft and nontender. No organomegaly or masses felt. Normal bowel sounds heard. Central nervous system: Alert and oriented. No focal neurological deficits. Extremities:  Left upper extremity: Dressing dry and intact. Skin: No rashes, lesions or ulcers Psychiatry: Judgement and insight appear normal. Mood & affect appropriate.    Data Reviewed: I have personally reviewed following labs and imaging studies  CBC: Recent Labs  Lab 01/04/20 0325  WBC 11.4*  HGB 9.1*  HCT 29.4*  MCV 88.8  PLT 224   Basic Metabolic Panel: Recent Labs  Lab 01/04/20 0325  NA 133*  K 3.7  CL 97*  CO2 28  GLUCOSE 87  BUN 10  CREATININE 0.50  CALCIUM 8.7*   GFR: Estimated Creatinine Clearance: 69 mL/min (by C-G formula based on SCr of 0.5 mg/dL). Liver Function Tests: No  results for input(s): AST, ALT, ALKPHOS, BILITOT, PROT, ALBUMIN in the last 168 hours. No results for input(s): LIPASE, AMYLASE in the last 168 hours. No results for input(s): AMMONIA in the last 168 hours. Coagulation Profile: Recent Labs  Lab 01/05/20 0800  INR 1.3*   Cardiac Enzymes: Recent Labs  Lab 01/05/20 0317  CKTOTAL 24*   BNP (last 3 results) No results for input(s): PROBNP in the last 8760 hours. HbA1C: No results for input(s): HGBA1C in the last 72 hours. CBG: No results for input(s): GLUCAP in the last 168 hours. Lipid Profile: No results for input(s): CHOL, HDL, LDLCALC, TRIG, CHOLHDL, LDLDIRECT in the last 72 hours. Thyroid Function Tests: No results for input(s): TSH, T4TOTAL, FREET4, T3FREE, THYROIDAB in the last 72 hours. Anemia Panel: No results for input(s): VITAMINB12, FOLATE, FERRITIN, TIBC, IRON, RETICCTPCT in the last 72 hours. Sepsis Labs: No results for input(s): PROCALCITON, LATICACIDVEN in the last 168 hours.  Recent Results (from the past 240 hour(s))  Body fluid culture     Status: None   Collection Time: 01/05/20  2:56 PM   Specimen: Pleura  Result Value Ref Range Status   Specimen Description PLEURAL FLUID  Final   Special Requests LEFT CHEST  Final   Gram Stain NO WBC SEEN NO ORGANISMS SEEN   Final   Culture   Final    NO GROWTH Performed at [facility], [address], [place], [place] [address]    Report Status 01/08/2020 FINAL  Final      Radiology Studies: DG Chest Port 1 View  Result Date: 01/08/2020 CLINICAL DATA:  Evaluate pleural fluid and pneumothorax EXAM: PORTABLE CHEST 1 VIEW COMPARISON:  Five days ago FINDINGS: Chest drain on the left with no visible residual gas collection. There is adjacent pleural fluid/thickening along the lateral chest wall. Multiple pulmonary nodules, some cavitary (as along the paramediastinal left lower lobe). No apical pneumothorax. Stable heart size. Right PICC with tip at the upper  cavoatrial junction. IMPRESSION: Improvement in fluid collection in the left chest after percutaneous drainage. No new abnormality. Electronically Signed   By: Marnee Spring M.D.   On: 01/08/2020 07:14    Scheduled Meds: . chlorhexidine  15 mL Mouth Rinse BID  . Chlorhexidine Gluconate Cloth  6 each Topical Daily  . clonazepam  0.25 mg Oral Daily  . collagenase   Topical Daily  . diclofenac Sodium  4 g Topical QID  . doxycycline  100 mg Oral Q12H  . escitalopram  20 mg Oral QHS  . feeding supplement  237 mL Oral TID BM  . gabapentin  300 mg Oral TID  . lidocaine  2 patch Transdermal Q24H  . mouth rinse  15 mL Mouth Rinse q12n4p  . methadone  20 mg Oral Q12H  . multivitamin with  minerals  1 tablet Oral Daily  . pantoprazole  40 mg Oral Daily  . sodium chloride flush  10-40 mL Intracatheter Q12H  . sodium chloride flush  10-40 mL Intracatheter Q12H   Continuous Infusions: . sodium chloride 10 mL/hr at 12/05/19 0617  . sodium chloride Stopped (12/19/19 2156)  . DAPTOmycin (CUBICIN)  IV 500 mg (01/07/20 1959)     LOS: 39 days   Time spent: 35 minutes  Alfons Sulkowski Estill Cotta, MD [facility]  If 7PM-7AM, please contact night-coverage [email or web address] 01/08/2020, 11:25 AM

## 2020-01-09 ENCOUNTER — Inpatient Hospital Stay (HOSPITAL_COMMUNITY): Payer: Self-pay

## 2020-01-09 LAB — BASIC METABOLIC PANEL
Anion gap: 8 (ref 5–15)
BUN: 7 mg/dL (ref 6–20)
CO2: 27 mmol/L (ref 22–32)
Calcium: 8.8 mg/dL — ABNORMAL LOW (ref 8.9–10.3)
Chloride: 100 mmol/L (ref 98–111)
Creatinine, Ser: 0.49 mg/dL (ref 0.44–1.00)
GFR, Estimated: >60 mL/min (ref >60)
Glucose, Bld: 110 mg/dL — ABNORMAL HIGH (ref 70–99)
Potassium: 4 mmol/L (ref 3.5–5.1)
Sodium: 135 mmol/L (ref 135–145)

## 2020-01-09 LAB — CBC
HCT: 31.8 % — ABNORMAL LOW (ref 36.0–46.0)
Hemoglobin: 9.2 g/dL — ABNORMAL LOW (ref 12.0–15.0)
MCH: 25.8 pg — ABNORMAL LOW (ref 26.0–34.0)
MCHC: 28.9 g/dL — ABNORMAL LOW (ref 30.0–36.0)
MCV: 89.1 fL (ref 80.0–100.0)
Platelets: 289 10*3/uL (ref 150–400)
RBC: 3.57 MIL/uL — ABNORMAL LOW (ref 3.87–5.11)
RDW: 19.9 % — ABNORMAL HIGH (ref 11.5–15.5)
WBC: 7.6 10*3/uL (ref 4.0–10.5)
nRBC: 0 % (ref 0.0–0.2)

## 2020-01-09 IMAGING — CT CT CHEST W/O CM
2 of 5 series · 13 of 36 positions shown, 16 images · non-contrast
Comparison: Chest CT dated [DATE].

CLINICAL DATA: History of LEFT hydropneumothorax status post LEFT
chest tube placement on [DATE].

EXAM:
CT CHEST WITHOUT CONTRAST
TECHNIQUE: Multidetector CT imaging of the chest was performed following the
standard protocol without IV contrast.

[Series 4: thorax 2.0 · axial · 0.63mm/px · z∈[+1355,+1561]mm · 10 of 127 slices shown, 13 images]
[im 12/127  mediastinal]
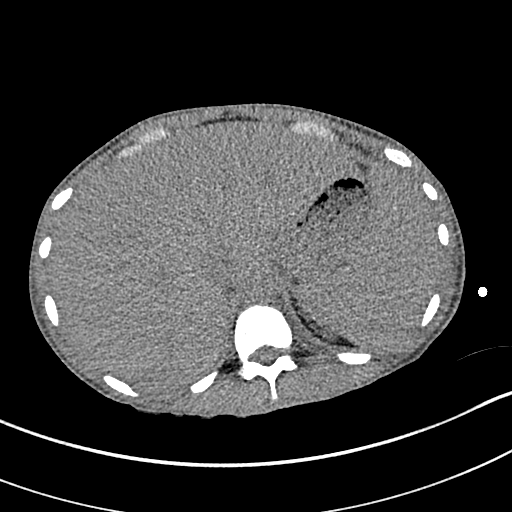
[im 12/127  lung]
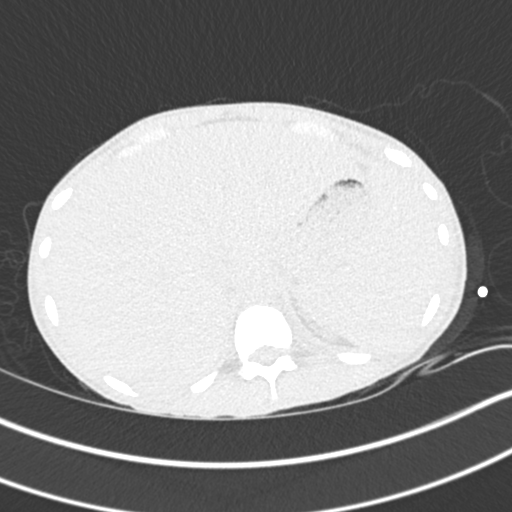
[im 23/127  lung]
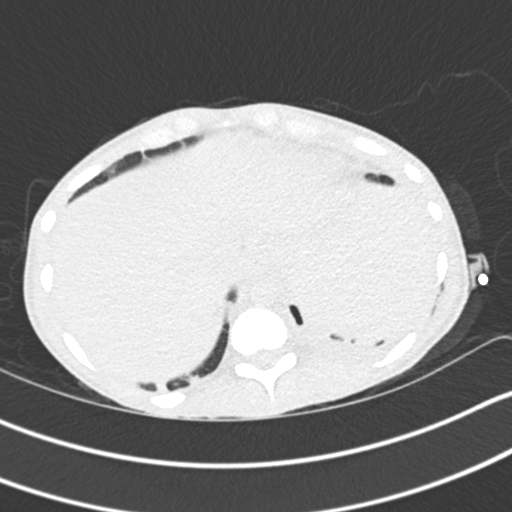
[im 35/127  lung]
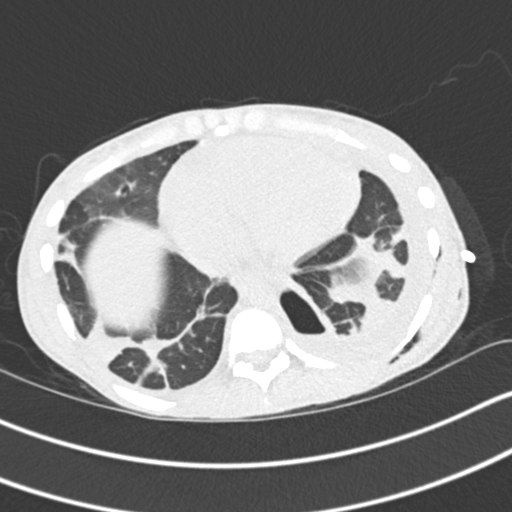
[im 46/127  lung]
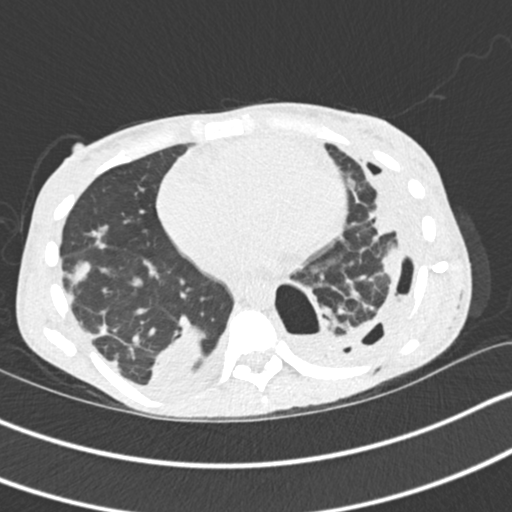
[im 58/127  mediastinal]
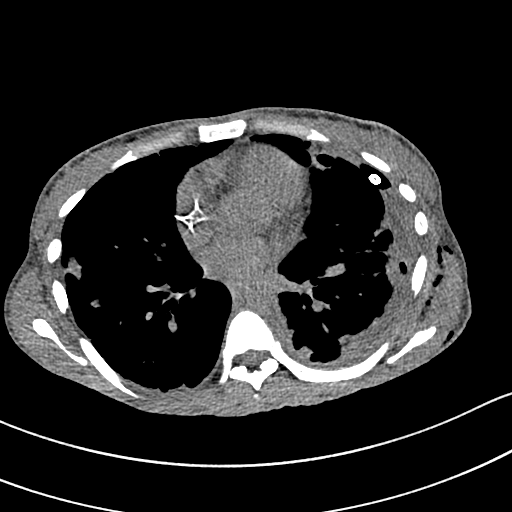
[im 58/127  lung]
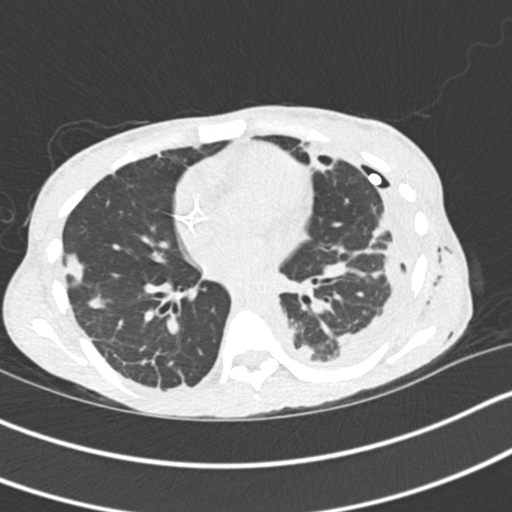
[im 69/127  lung]
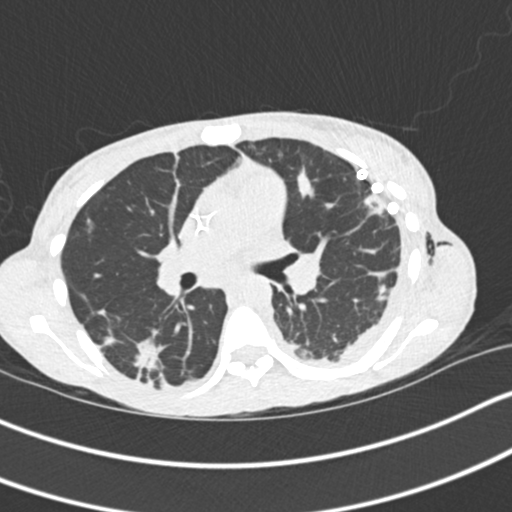
[im 81/127  lung]
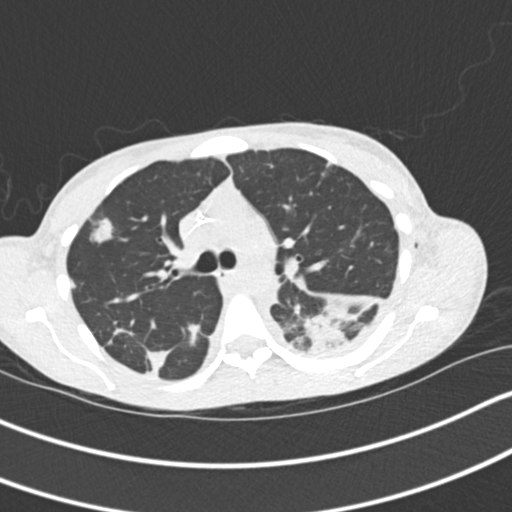
[im 92/127  lung]
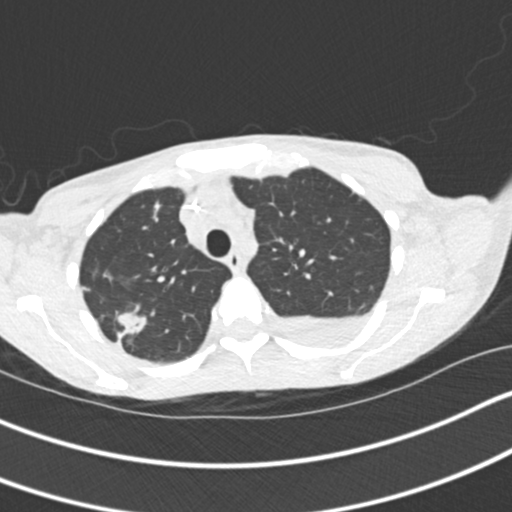
[im 104/127  mediastinal]
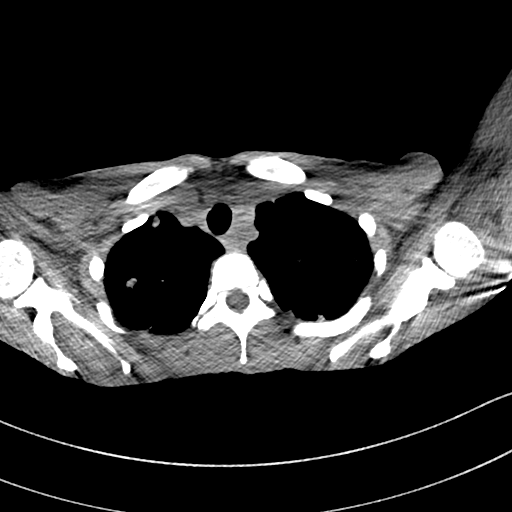
[im 104/127  lung]
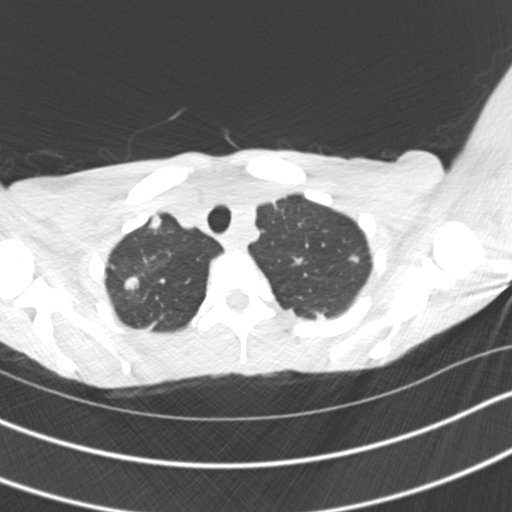
[im 115/127  lung]
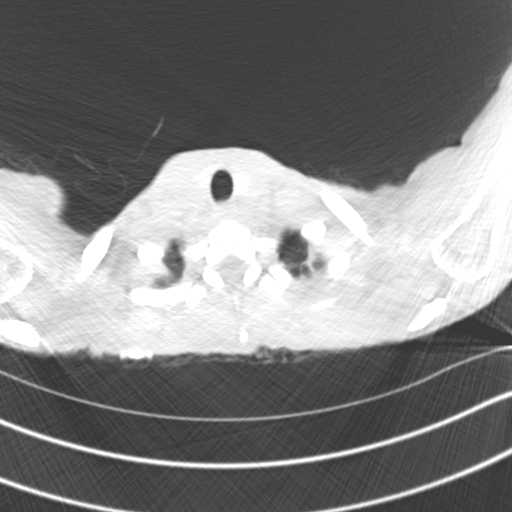

[Series 7: coronal · coronal · 0.51mm/px · 3 of 101 slices shown]
[im 21/101  lung]
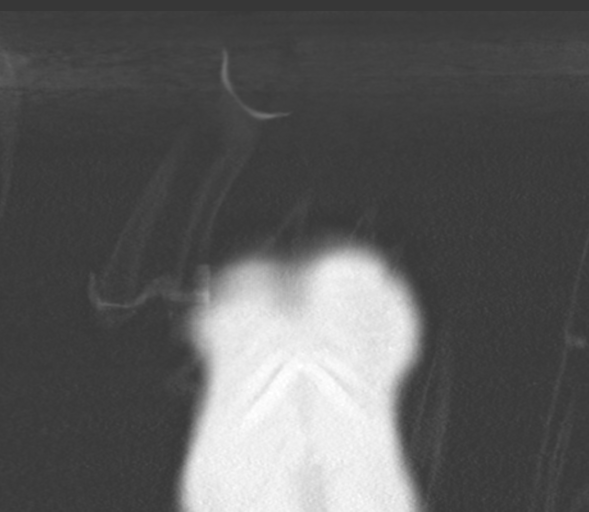
[im 41/101  lung]
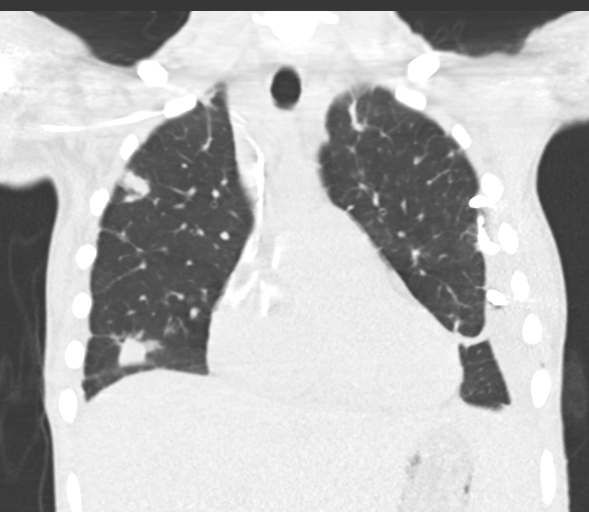
[im 61/101  lung]
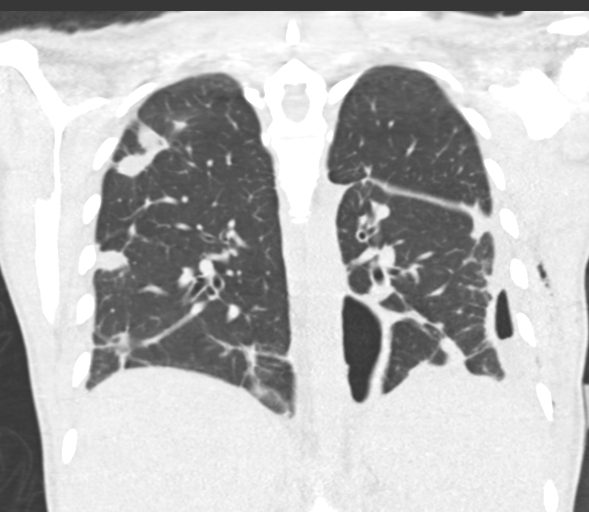

[13 of 36 positions shown; findings below may reference images not displayed]

FINDINGS: Cardiovascular: Thoracic aorta is normal in caliber and
configuration. No pericardial effusion.

Mediastinum/Nodes: No mass or enlarged lymph nodes are seen within
the mediastinum. Trachea and central bronchi are unremarkable.

Lungs/Pleura: LEFT-sided chest tube in place. Chest tube is well
positioned within the previously described complex collection of
fluid and air in the LEFT pleural space. This component of the
LEFT-sided pleural collection is nearly completely resolved. There
remains complex loculated-appearing collections of fluid and air
along the posterior and medial aspects of the LEFT lower lobe,
measuring 4 cm and 5 cm greatest dimension respectively.

The additional previous described multiple patchy/nodular opacities
within the bilateral lungs, some cavitary, are not significantly
changed in the short-term interval, compatible with atypical
pneumonia versus septic emboli.

Upper Abdomen: Limited images of the upper abdomen are unremarkable.

Musculoskeletal: No acute appearing osseous abnormality
IMPRESSION: 1. LEFT-sided chest tube is well positioned within the previously
described complex collection of fluid and air in the LEFT pleural
space. This component of the LEFT-sided pleural collection is nearly
completely resolved.
2. There remains complex loculated-appearing collections of fluid
and air along the posterior and medial aspects of the LEFT lower
lobe, largest component at the medial aspect of the LEFT lung base
measuring 5 cm greatest dimension. These may be secondary to
bronchopleural fistula, as previously suggested.
3. Multiple patchy/nodular consolidations are again seen throughout
the bilateral lungs, some cavitary, not significantly changed in the
short-term interval, compatible with atypical pneumonia versus
septic emboli. Favor septic emboli based on clinical data of drug
abuse provided on previous exams.

## 2020-01-09 IMAGING — CR DG CHEST 2V
2 series · 2 of 2 positions shown · non-contrast
Comparison: [DATE] chest radiograph; chest CT [DATE]

CLINICAL DATA: Recent chest tube placement

EXAM:
CHEST - 2 VIEW

[chest lat]
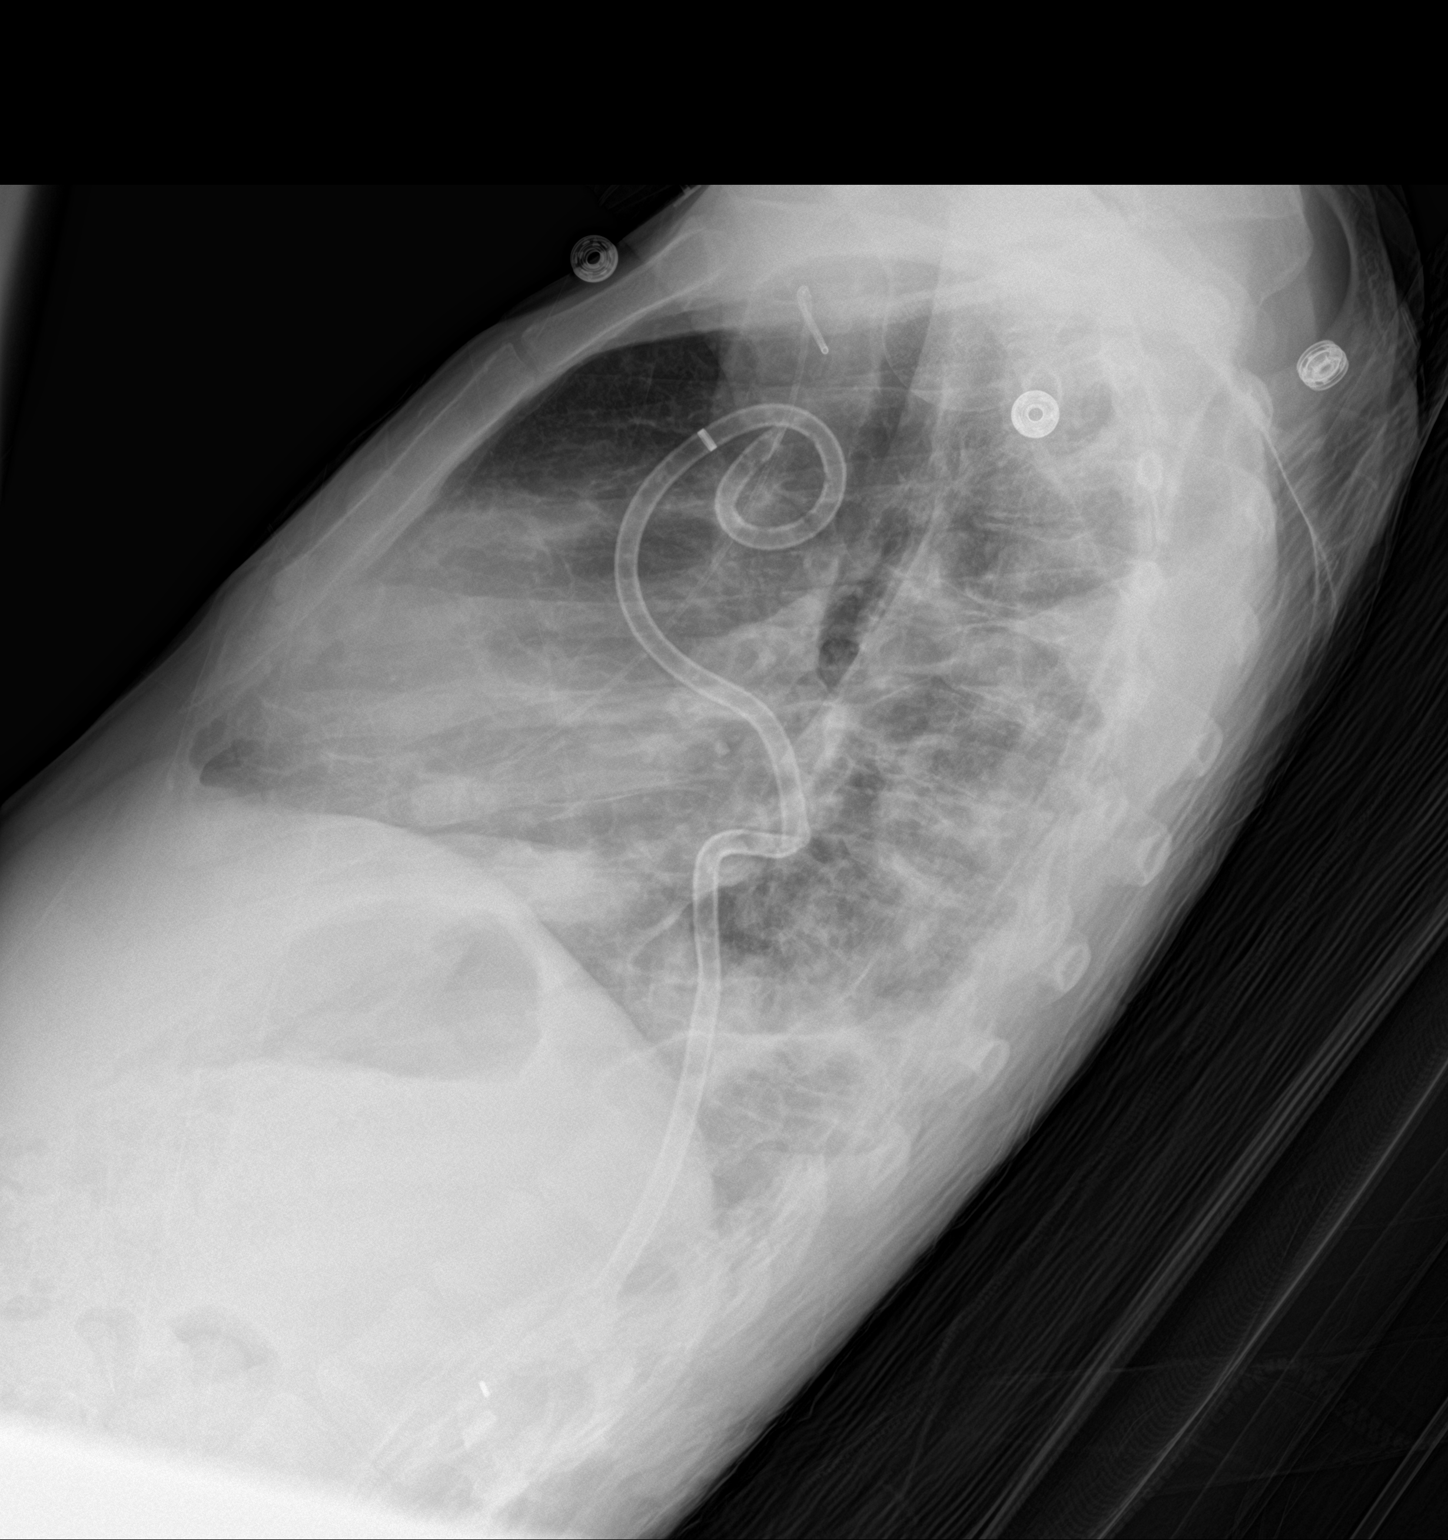

[chest ap]
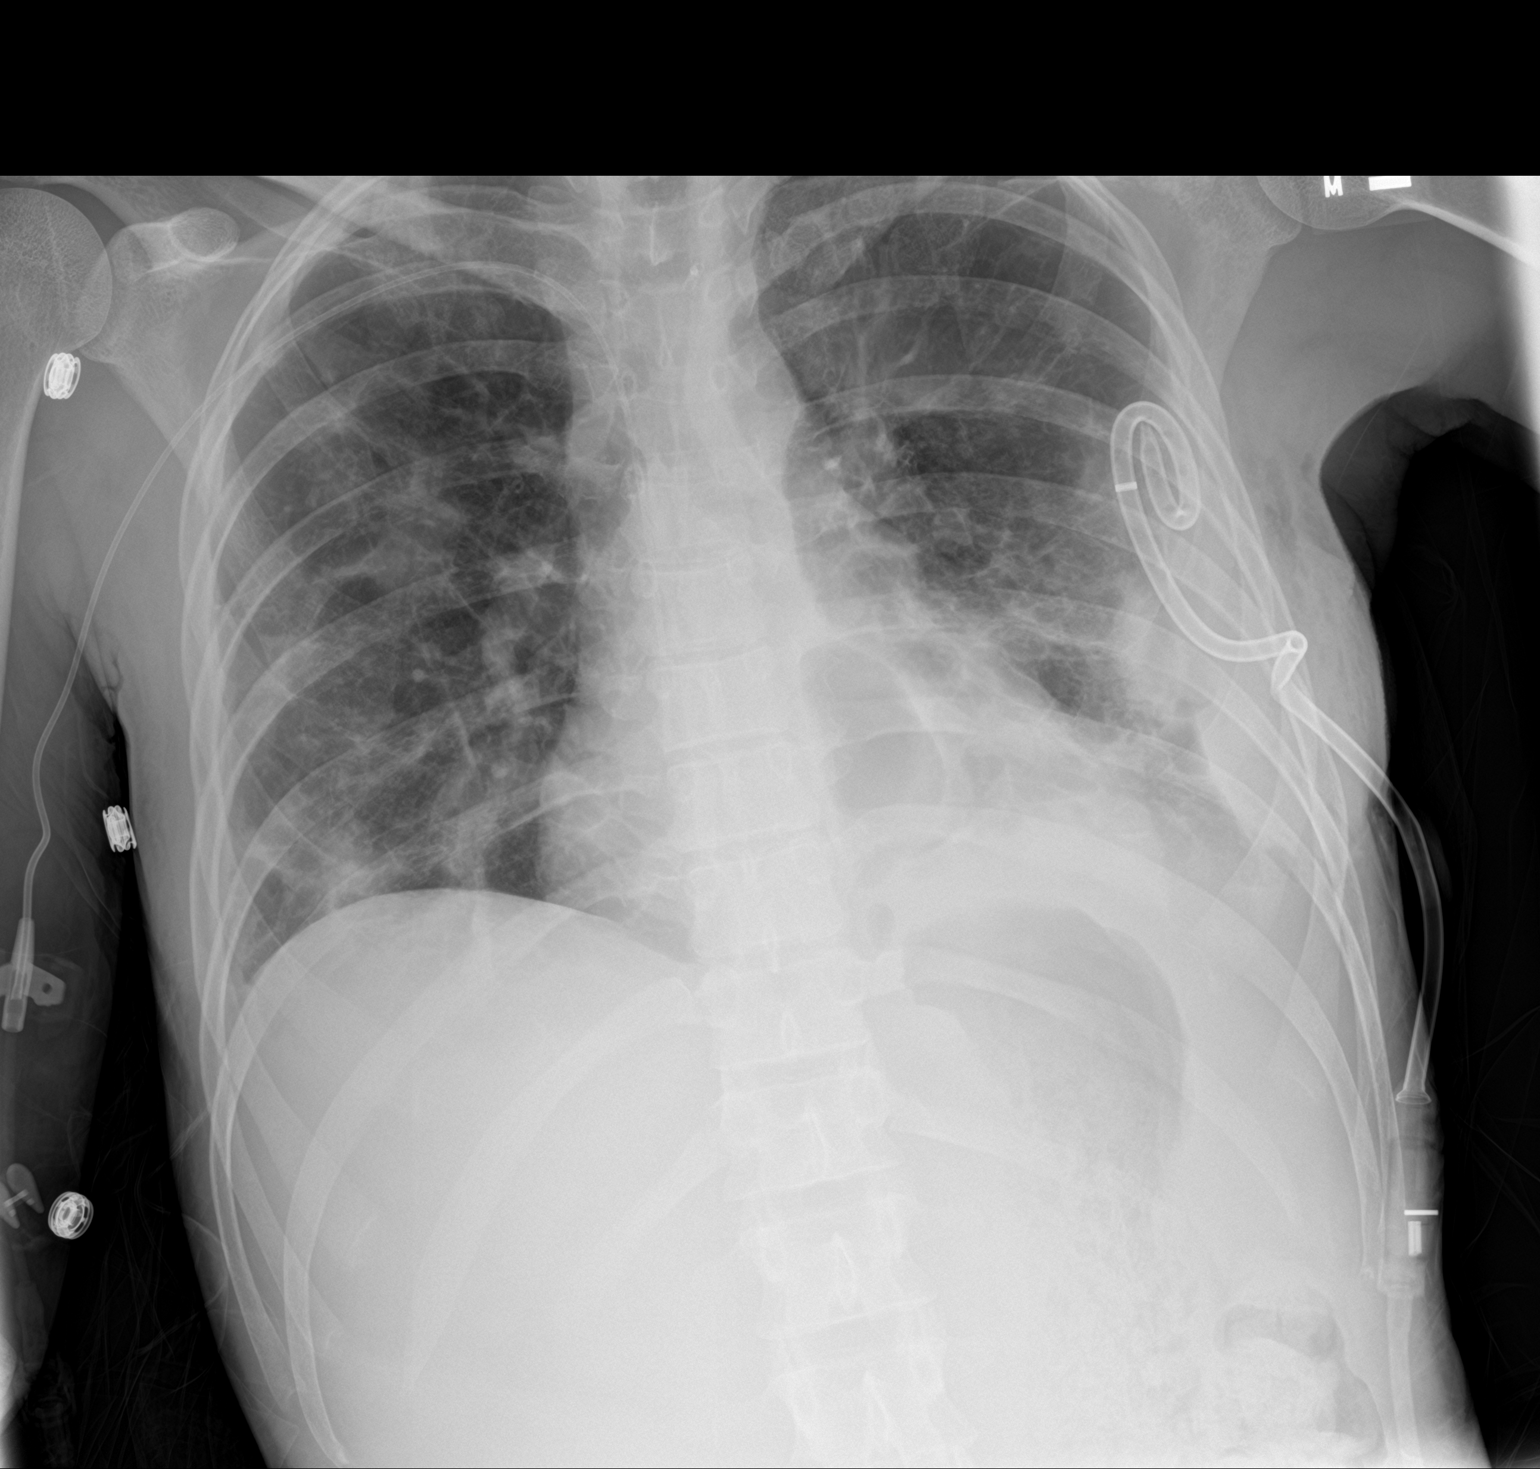

[2 of 2 positions shown; findings below may reference images not displayed]

FINDINGS: Chest tube again noted on the left. No appreciable pneumothorax.
Consolidation along the lateral aspect of the left lung appears
stable. There is mild subcutaneous air on the left.

Central catheter tip is in the superior vena cava near the
cavoatrial junction. Patchy airspace opacity on the right remains
with areas of subtle cavitation, better delineated on recent CT.
There is atelectatic change in the medial left lower lung region,
similar to 1 day prior. Heart size and pulmonary vascularity are
normal. No adenopathy. No appreciable bone lesions.
IMPRESSION: Tube and catheter positions are unchanged. No pneumothorax.
Persistent airspace opacity in the area of chest tube in the
periphery of the left mid and lower lung regions. Subtle areas of
cavitation at multiple sites on the right with multiple foci of
airspace opacity on the right. Atelectasis left base. No new opacity
evident. Stable cardiac silhouette. No adenopathy appreciable by
radiography.

## 2020-01-09 MED ORDER — PROSOURCE PLUS PO LIQD
30.0000 mL | Freq: Three times a day (TID) | ORAL | Status: DC
  Filled 2020-01-09 (×8): qty 30

## 2020-01-09 NOTE — Progress Notes (Signed)
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 Patient awake and alert laying in bed with no complaints at this time. Refusing to let me remove chest tube dressing secondary to "possible pain with touching".  CXR this AM: 1. Tube and catheter positions are unchanged. No pneumothorax. Persistent airspace opacity in the area of chest tube in the periphery of the left mid and lower lung regions. Subtle areas of cavitation at multiple sites on the right with multiple foci of airspace opacity on the right. Atelectasis left base. No new opacity evident. Stable cardiac silhouette. No adenopathy appreciable by radiography.   Allergies: Bee venom  Medications: Prior to Admission medications   Medication Sig Start Date End Date Taking? Authorizing Provider  acetaminophen (TYLENOL) 500 MG tablet Take 1,000 mg by mouth every 6 (six) hours as needed for headache.    Yes [provider]  ibuprofen (ADVIL) 600 MG tablet Take 1 tablet (600 mg total) by mouth every 6 (six) hours as needed. Patient not taking: Reported on 11/03/2019 08/20/18   Fayrene Helper, PA-C  ondansetron (ZOFRAN ODT) 4 MG disintegrating tablet Take 1 tablet (4 mg total) by mouth every 8 (eight) hours as needed for nausea or vomiting. Patient not taking: Reported on 11/03/2019 01/11/19   Liberty Handy, PA-C     Vital Signs: BP 105/67   Pulse 95   Temp 98.2 F (36.8 C) (Oral)   Resp 17   Ht 5\' 1"  (1.549 m)   Wt 92 lb 13 oz (42.1 kg)   LMP  (LMP Unknown)   SpO2 97%   BMI 17.54 kg/m   Physical Exam Vitals and nursing note reviewed.  Constitutional:      General: She is not in acute distress. Pulmonary:     Effort: Pulmonary effort is normal. No respiratory distress.      Comments: On RA. Unable to assess chest tube insertion site secondary to patient refusal; pleure-vac with approximately 110 cc of serosanguinous fluid; tube to suction with (-) air leak. Skin:    General: Skin is warm and dry.  Neurological:     Mental Status: She is alert and oriented to person, place, and time.     Imaging: DG Chest 2 View  Result Date: 01/09/2020 CLINICAL DATA:  Recent chest tube placement EXAM: CHEST - 2 VIEW COMPARISON:  January 08, 2020 chest radiograph; chest CT January 03, 2020 FINDINGS: Chest tube again noted on the left. No appreciable pneumothorax. Consolidation along the lateral aspect of the left lung appears stable. There is mild subcutaneous air on the left. Central catheter tip is in the superior vena cava near the cavoatrial junction. Patchy airspace opacity on the right remains with areas of subtle cavitation, better delineated on recent CT. There is atelectatic change in the medial left lower lung region, similar to 1 day prior. Heart size and pulmonary vascularity are normal. No adenopathy. No appreciable bone lesions. IMPRESSION: Tube and catheter positions are unchanged. No pneumothorax. Persistent airspace opacity in the area of chest tube in the periphery of the left mid and lower lung regions. Subtle areas of cavitation at multiple sites on the right with multiple foci of airspace opacity on the right. Atelectasis left base. No new opacity evident. Stable cardiac silhouette. No adenopathy appreciable by radiography. Electronically  Signed   By: Bretta Bang III M.D.   On: 01/09/2020 09:26   DG Chest Port 1 View  Result Date: 01/08/2020 CLINICAL DATA:  Evaluate pleural fluid and pneumothorax EXAM: PORTABLE CHEST 1 VIEW COMPARISON:  Five days ago FINDINGS: Chest drain on the left with no visible residual gas collection. There is adjacent pleural fluid/thickening along the lateral chest wall. Multiple pulmonary nodules, some cavitary (as along the  paramediastinal left lower lobe). No apical pneumothorax. Stable heart size. Right PICC with tip at the upper cavoatrial junction. IMPRESSION: Improvement in fluid collection in the left chest after percutaneous drainage. No new abnormality. Electronically Signed   By: Marnee Spring M.D.   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 CT surgery recommended placement of a image guided chest tube. EXAM: CT-GUIDED CHEST TUBE PLACEMENT MEDICATIONS: Moderate sedation ANESTHESIA/SEDATION: Fentanyl 50 mcg IV; Versed 1.0 mg IV Moderate Sedation Time:  23 minutes The patient was continuously monitored during the procedure by the interventional radiology nurse under my direct supervision. COMPLICATIONS: None immediate. PROCEDURE: Informed written consent was obtained from the patient after a thorough discussion of the procedural risks, benefits and alternatives. All questions were addressed. Maximal Sterile Barrier Technique was utilized including caps, mask, sterile gowns, sterile gloves, sterile drape, hand hygiene and skin antiseptic. A timeout was performed prior to the initiation of the procedure. Patient was placed supine on the CT scanner with the left side mildly elevated. CT images through the chest were obtained. Left mid axillary region was prepped with chlorhexidine and sterile field was created. Skin and soft tissues were anesthetized using 1% lidocaine. Small incision was made. Using CT guidance, a Yueh catheter was directed into the left pleural space. Gas was aspirated. A small amount of yellow fluid was aspirated. Superstiff Amplatz wire was advanced into the pleural space and the tract was dilated to accommodate a 14 [place] multipurpose drain. Catheter was attached to chest tube drainage  system and attached to wall suction. Follow up CT images were obtained. Catheter was sutured to skin. FINDINGS: Again noted are numerous irregular opacities throughout both lungs and concerning for septic emboli. There is a large air-fluid collection in the left lower chest and similar to the recent CT on 01/03/2020. Hydropneumothorax was slightly smaller in size on the post drain images. Gas within the subcutaneous tissues on the post drain images. IMPRESSION: CT-guided placement of a chest tube within the left hydropneumothorax. Electronically Signed   By: Richarda Overlie M.D.   On: 01/05/2020 18:46    Labs:  CBC: Recent Labs    12/20/19 0416 12/24/19 0325 01/04/20 0325 01/09/20 0419  WBC 6.7 6.6 11.4* 7.6  HGB 8.2* 7.6* 9.1* 9.2*  HCT 28.2* 25.8* 29.4* 31.8*  PLT 187 257 224 289    COAGS: Recent Labs    11/03/19 0950 11/04/19 0434 11/30/19 1226 01/05/20 0800  INR 1.3* 1.5* 1.7* 1.3*  APTT 32  --  34  --     BMP: Recent Labs    01/10/19 2200 11/03/19 0950 12/24/19 0325 12/28/19 0530 01/04/20 0325 01/09/20 0419  NA 128*   < > 136 136 133* 135  K 3.8   < > 3.8 4.0 3.7 4.0  CL 94*   < > 100 97* 97* 100  CO2 22   < >  28 31 28 27   GLUCOSE 185*   < > 76 124* 87 110*  BUN 20   < > 6 8 10 7   CALCIUM 8.9   < > 8.5* 8.8* 8.7* 8.8*  CREATININE 0.86   < > 0.35* 0.41* 0.50 0.49  GFRNONAA >60   < > >60 >60 >60 >60  GFRAA >60  --   --   --   --   --    < > = values in this interval not displayed.    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 Left chest tube stable with approximately 110 serosanguinous fluid in  pleure-vac, tube to suction with (-) air leak. Per TCTS tube ready for removal. Discussed with Dr. 14/03/21 who recommends repeat CT chest prior to removal- order placed, IR to follow and give further recommendations once complete. Continue current tube management- tube to suction at this time. Further plans per [facility]- appreciate and agree with management. IR to follow.   Electronically Signed: Elby Showers, PA-C 01/09/2020, 12:43 PM   I spent a total of 25 Minutes at the the patient's bedside AND on the patient's hospital floor or unit, greater than 50% of which was counseling/coordinating care for left hydropneumothorax s/p left chest tube placement.

## 2020-01-09 NOTE — Progress Notes (Signed)
Nutrition Follow-up  DOCUMENTATION CODES:   Severe malnutrition in context of acute illness/injury,Underweight  INTERVENTION:   -Continue Ensure Enlive po TID, each supplement provides 350 kcal and 20 grams of protein -Continue Magic cup TID with meals, each supplement provides 290 kcal and 9 grams of protein -Continue MVI with minerals daily -30 ml Prosource Plus TID, each supplement provides 100 kcals and 15 grams protein  NUTRITION DIAGNOSIS:   Severe Malnutrition related to acute illness (MRSA endocarditis) as evidenced by energy intake < or equal to 50% for > or equal to 5 days,percent weight loss (8.5% weight loss in less than 2 months).  Ongoing  GOAL:   Patient will meet greater than or equal to 90% of their needs  Progressing   MONITOR:   PO intake,Supplement acceptance,Labs,Weight trends,Skin  REASON FOR ASSESSMENT:   Consult Enteral/tube feeding initiation and management (nocturnal TF)  ASSESSMENT:   29 yo female admitted with septic shock, AKI. PMH includes tricuspid MRSA endocarditis, IVDU.  Reviewed I/O's: +80 ml x 24 hours and -3.3 L since 12/26/19  UOP: 300 ml x 24 hours  Chest tube output: 100 ml x 24 hours  Per MD notes, plan to complete IV antibiotics on 01/16/19.  Pt with erratic oral intake. Noted meal completions 25-75%. She is intermittently accepting of Ensure Enlive supplements.   Labs reviewed.   Diet Order:   Diet Order            Diet regular Room service appropriate? Yes; Fluid consistency: Thin  Diet effective now                 EDUCATION NEEDS:   Education needs have been addressed  Skin:  Skin Assessment: Skin Integrity Issues: Skin Integrity Issues:: Incisions,Other (Comment) Incisions: right neck, right groin Other: infiltration/phlebitis to left arm  Last BM:  01/04/20  Height:   Ht Readings from Last 1 Encounters:  12/29/19 5\' 1"  (1.549 m)    Weight:   Wt Readings from Last 1 Encounters:  01/04/20 42.1  kg    Ideal Body Weight:  50 kg  BMI:  Body mass index is 17.54 kg/m.  Estimated Nutritional Needs:   Kcal:  1800-2000  Protein:  80-95 gm  Fluid:  >/= 1.5 L    01/06/20, RD, LDN, CDCES Registered Dietitian II Certified Diabetes Care and Education Specialist Please refer to [facility] for RD and/or RD on-call/weekend/after hours pager

## 2020-01-09 NOTE — Progress Notes (Signed)
Physical Therapy Treatment Patient Details Name: Amber [name] MRN: [id number] DOB: 1990/12/20 Today's Date: 01/09/2020    History of Present Illness 29 yo admitted 11/17 with septic shock due to MRSA endocarditis with IVDU fentanyl and heroin day of admission. PMhx: admission 10/23-10/26 for endocarditis left AMA, IVDU, cellulitis, depression    PT Comments    Ambulation progression limited by chest tube on suction to wall. Patient modI for bed mobility and transfers. Patient ambulated 68' with RW and supervision for safety. Assisted patient with oral care with set upA. Patient continues to be limited by pain, decreased activity tolerance, generalized weakness. Recommend OPPT following discharge if hip pain persists.     Follow Up Recommendations  Outpatient PT;Other (comment) (if hip pain persists)     Equipment Recommendations  Rolling Herman Mell with 5" wheels;3in1 (PT)    Recommendations for Other Services       Precautions / Restrictions Precautions Precautions: Fall Precaution Comments: L chest tube on suction Restrictions Weight Bearing Restrictions: No    Mobility  Bed Mobility Overal bed mobility: Modified Independent             General bed mobility comments: hesitant this session due to L chest tube  Transfers Overall transfer level: Modified independent Equipment used: Rolling Allayah Raineri (2 wheeled) Transfers: Sit to/from [employer] Sit to Stand: Modified independent (Device/Increase time) Stand pivot transfers: Modified independent (Device/Increase time)       General transfer comment: stand pivot t/f bed>BSC  Ambulation/Gait Ambulation/Gait assistance: Supervision Gait Distance (Feet): 20 Feet Assistive device: Rolling Constanza Mincy (2 wheeled) Gait Pattern/deviations: Step-through pattern;Decreased stride length;Wide base of support     General Gait Details: supervision for safety and management of suction tubing   Stairs              Wheelchair Mobility    Modified Rankin (Stroke Patients Only)       Balance Overall balance assessment: Modified Independent                                          Cognition Arousal/Alertness: Awake/alert Behavior During Therapy: WFL for tasks assessed/performed Overall Cognitive Status: Within Functional Limits for tasks assessed                                        Exercises      General Comments General comments (skin integrity, edema, etc.): motivated to move and walk in the room      Pertinent Vitals/Pain Pain Assessment: Faces Faces Pain Scale: Hurts even more Pain Location: L chest tube site Pain Descriptors / Indicators: Discomfort;Guarding Pain Intervention(s): Monitored during session    Home Living                      Prior Function            PT Goals (current goals can now be found in the care plan section) Acute Rehab PT Goals Patient Stated Goal: to get this chest tube out PT Goal Formulation: With patient Time For Goal Achievement: 01/13/20 Potential to Achieve Goals: Good Progress towards PT goals: Progressing toward goals    Frequency    Min 3X/week      PT Plan Current plan remains appropriate    Co-evaluation  AM-PAC PT "6 Clicks" Mobility   Outcome Measure  Help needed turning from your back to your side while in a flat bed without using bedrails?: None Help needed moving from lying on your back to sitting on the side of a flat bed without using bedrails?: None Help needed moving to and from a bed to a chair (including a wheelchair)?: None Help needed standing up from a chair using your arms (e.g., wheelchair or bedside chair)?: None Help needed to walk in hospital room?: A Little Help needed climbing 3-5 steps with a railing? : A Little 6 Click Score: 22    End of Session Equipment Utilized During Treatment: Other (comment) (L chest tube) Activity  Tolerance: Patient tolerated treatment well Patient left: in bed;with call bell/phone within reach Nurse Communication: Mobility status PT Visit Diagnosis: Difficulty in walking, not elsewhere classified (R26.2);Pain Pain - Right/Left: Left Pain - part of body:  (chest tube site)     Time: 9485-4627 PT Time Calculation (min) (ACUTE ONLY): 27 min  Charges:  $Therapeutic Activity: 23-37 mins                     Gregor Hams, PT, DPT Acute Rehabilitation Services Pager [phone] Office [phone]    [name] 01/09/2020, 1:24 PM

## 2020-01-09 NOTE — Progress Notes (Signed)
[facility] PROGRESS NOTE  Amber [name] OZH:[id number] DOB: 12-Apr-1990 DOA: 11/30/2019 PCP: Patient, No Pcp Per     12/15  Status: Remains inpatient appropriate because:Ongoing active pain requiring inpatient pain management, Ongoing diagnostic testing needed not appropriate for outpatient work up, Unsafe d/c plan, IV treatments appropriate due to intensity of illness or inability to take PO and Inpatient level of care appropriate due to severity of illness   Dispo: The patient is from: Home              Anticipated d/c is to: Home              Anticipated d/c date is: > 3 days (01/16/20)              Patient currently is not medically stable to d/c.  Patient's primary reason for remaining in the hospital is to complete 6 weeks of IV and oral antibiotics for her endocarditis with septic emboli.  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  Code Status: Full Family Communication: Patient; mother 12/21 and 12/23 DVT prophylaxis: SCDs 2/2 recent thrombocytopenia due to sepsis Vaccination status: Has not been vaccinated against Covid  Foley catheter: No  HPI: 29 year old female patient with known IV drug abuse with known tricuspid MRSA endocarditis who signed out AMA x2 prior to this admission.  She presented with progressive infection.  On 10/21 through 10/23 she was admitted with fever with blood cultures positive for MRSA.  CT of the chest demonstrated septic emboli and edematous L4-5 facet joint (? Evolving osteomyelitis) as well as tricuspid vegetation on CT.  She left AMA.  She returned 2 hours later on 10/23 with severe back pain.  Underwent TEE that demonstrated a 1 cm tricuspid valve mass.  She was evaluated by CT surgery who recommended medical management.  Again she left AMA on 10/25.  Patient was staying with friends but presented back  to the hospital due to increasing shortness of breath, weakness, fatigue malaise and nausea.  She arrived in the parking lot obtunded and was found to have multiple needles in her car on 11/17.  In the ER she was hypotensive and started on pressors.  Hemoglobin was low and she was given a blood transfusion.  She was also started on empiric IV vancomycin for known MRSA infection  and was admitted to the critical care service.  She eventually was taken to the OR for angio VAC during this admission.  She has subsequently transitioned out of ICU to stepdown unit.  She has had an IV infiltration and has a superficial wound on her left forearm. ID continues to follow regarding the endocarditis and recommends a total of 6 weeks therapy be daptomycin.  She has continued to have hypoxemia and productive cough and on 11/30 ID added doxycycline.  She is reporting pleuritic chest pain as well.  She is also very malnourished and was started on tube feedings and developed mild electrolyte disturbances from refeeding syndrome which have resolved.  She has low-grade resting tachycardia with no significant abnormalities on echo and cardiology previously did not suspect cardiac etiology to her tachycardia.  Hepatitis C antibody has been positive; she is HIV negative.  She has had persistent elevation in her LFTs.  She is currently on methadone 20 mg 3 times a day but will need to be on no more than 40 mg per 24 hours at  time of discharge to be eligible for outpatient methadone treatment.  Subjective: Continues to have pain at pigtail catheter insertion site but better than previous- no other complaints reported.  Objective: Vitals:   01/08/20 1931 01/09/20 0650  BP: 109/67 105/67  Pulse: (!) 103 95  Resp: 18 17  Temp: 98.3 F (36.8 C) 98.2 F (36.8 C)  SpO2: 99% 97%    Intake/Output Summary (Last 24 hours) at 01/09/2020 1243 Last data filed at 01/09/2020 0507 Gross per 24 hour  Intake 480 ml  Output 400 ml  Net  80 ml   Filed Weights   12/31/19 0647 01/02/20 0500 01/04/20 0624  Weight: 41.4 kg 41.4 kg 42.1 kg    Exam: General: Awake, more calm today, continues to report pain at pigtail catheter insertion site Pulmonary: Bilateral lung sounds clear to auscultation stable on room air with O2 sats percent.  Pigtail catheter to close drainage system.  No insertion site crepitus Cardiac: S1-S2 with regular pulse, no peripheral edema Abdomen: Soft, nontender nondistended. 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 Lightfoot's assistance.  -Pigtail catheter to close drainage system placed on 12/23 -Follow-up chest x-ray on 12/27 solution of pneumothorax.  There are persistent areas of opacity in the area of the chest tube with subtle areas of cavitation at multiple sites in the right with multiple foci of airspace opacity on the right.  Dr. Cliffton AstersLightfoot has reviewed chest x-ray and recommends removal -12/27 IR consulted to remove chest tube  Acute hypoxemic respiratory failure (multifactorial) 1 acute sepsis 2 septic emboli with sequela of small pleural effusion -Sepsis physiology and hypoxemia resolved -Continue oral doxycycline for sequela from septic pulmonary emboli-will DC at discharge -As of 12/14 has remained stable off of nasal  cannula oxygen -Continue supportive care for pleuritic chest discomfort -Chest x-ray 12/21 revealed loculated pleural fluid possibly loculated pneumothorax-CT of the chest confirmed bronchopleural fistula -Encourage use of incentive spirometry  Persistent tachycardia/tricuspid valve insufficiency -Evaluated by CVTS and currently not a surgical candidate for cardiac valve replacement giving active IVDA prior to current admission (although was appropriate for angiovac surgery) -Echocardiogram: mild systolic LV dysfunction -cardiology felt this was  physiologic response to hypoalbuminemia/malnutrition, deconditioning and anemia. -Cardiology recommended to suppress physiologic tachycardia with beta-blockers nor utilize diuretics prophylactically due to increased risk of harm   LBP -Musculoskeletal in etiology noting no significant abnormalities on recent MRI -Continue supportive care with ibuprofen prn and lidocaine patch  Severe protein calorie malnutrition with associated refeeding syndrome -Initial BMI 17 patient briefly required tube feedings which have now been discontinued. -BMI continues to trend upward on regular diet with supplementation as below Ensure feeding supplements Nutrition Problem: Severe Malnutrition Etiology: acute illness (MRSA endocarditis) Signs/Symptoms: energy intake < or equal to 50% for > or equal to 5 days,percent weight loss (8.5% weight loss in less than 2 months) Percent weight loss: 8.5 % Interventions: Ensure Enlive (each supplement provides 350kcal and 20 grams of protein),Magic  cup,MVI Estimated body mass index is 17.54 kg/m as calculated from the following:   Height as of this encounter:  (1.549 m).   Weight as of this encounter: 42.1 kg.  Skin wound secondary to IV infiltration/right groin wound/right neck incision     11/30/19                        12/20/19                         12/30/19  -ID also following along regarding this wound.  No  indication to change current management and no indication to consult surgery -Dark eschar fell off on 12/10 as evidenced by pictures above wound continues to improve -Continue gabapentin 300 mg TID  Wound / Incision (Open or Dehisced) 12/10/19 Non-pressure wound Arm Left;Posterior;Lateral infiltration/phlebitis (Active)  Date First Assessed/Time First Assessed: 12/10/19 1915   Wound Type: Non-pressure wound  Location: Arm  Location Orientation: Left;Posterior;Lateral  Wound Description (Comments): infiltration/phlebitis    Assessments 12/10/2019 11:45 PM 01/08/2020  9:30 PM  Dressing Type Thin film;Non adherent;Gauze (Comment) Impregnated gauze (bismuth)  Dressing Changed New Reinforced  Dressing Status Intact Clean;Dry;Intact  Dressing Change Frequency PRN --  Site / Wound Assessment Painful;Red;Bleeding --  Closure None --  Drainage Amount Minimal --  Drainage Description Serosanguineous --  Treatment Other (Comment) --     No Linked orders to display    Chronic IV drug abuse with opiates/recent withdrawal syndrome -12/13: Discussed with patient that plan is to decrease to discharge dose of methadone 40 mg daily by next Monday 12/20 -Pam has been decreased to 0.25 mg BID and dose will be decreased to once daily today  GAD -As noted above weaning and decreasing benzodiazepine -Discussed with patient's mother and as I have also noted patient has significant anxiety issues -12/10: Lexapro 10 mg HS initiated and as of 12/24 will increase to 20 mg -Given increased anxiety post chest tube placed will not complete Klonopin taper/DC this medication at this time      Other problems: Anemia of chronic disease/recent sepsis related thrombocytopenia -Thrombocytopenia has resolved -Hemoglobin stable around 8 -With initiation of NSAIDs daily Protonix has been started  Hyponatremia -Current sodium stable at 133  Hep C antibody positive with persistent transaminitis -Currently has  nonobstructive transaminitis that has improved with improvement in nutrition -As of 12/3 albumin 2.0, AST 52, ALT normal at 42 and total bilirubin has been normal -HCVRNA quantitative pending   Data Reviewed: Basic Metabolic Panel: Recent Labs  Lab 01/04/20 0325 01/09/20 0419  NA 133* 135  K 3.7 4.0  CL 97* 100  CO2 28 27  GLUCOSE 87 110*  BUN 10 7  CREATININE 0.50 0.49  CALCIUM 8.7* 8.8*   Liver Function Tests: No results for input(s): AST, ALT, ALKPHOS, BILITOT, PROT, ALBUMIN in the last 168 hours. No results for input(s): LIPASE, AMYLASE in the last 168 hours. No results for input(s): AMMONIA in the last 168 hours. CBC: Recent Labs  Lab 01/04/20 0325 01/09/20 0419  WBC 11.4* 7.6  HGB 9.1* 9.2*  HCT 29.4* 31.8*  MCV 88.8 89.1  PLT 224 289   Cardiac Enzymes: Recent Labs  Lab 01/05/20 0317  CKTOTAL 24*   BNP (last 3 results) Recent Labs    11/30/19 1809  BNP 583.0*    ProBNP (last 3 results) No results for input(s): PROBNP in the last 8760 hours.  CBG: No  results for input(s): GLUCAP in the last 168 hours.  Recent Results (from the past 240 hour(s))  Body fluid culture     Status: None   Collection Time: 01/05/20  2:56 PM   Specimen: Pleura  Result Value Ref Range Status   Specimen Description PLEURAL FLUID  Final   Special Requests LEFT CHEST  Final   Gram Stain NO WBC SEEN NO ORGANISMS SEEN   Final   Culture   Final    NO GROWTH Performed at [facility], [address], [place], [place] [address]    Report Status 01/08/2020 FINAL  Final     Studies: DG Chest 2 View  Result Date: 01/09/2020 CLINICAL DATA:  Recent chest tube placement EXAM: CHEST - 2 VIEW COMPARISON:  January 08, 2020 chest radiograph; chest CT January 03, 2020 FINDINGS: Chest tube again noted on the left. No appreciable pneumothorax. Consolidation along the lateral aspect of the left lung appears stable. There is mild subcutaneous air on the left. Central catheter  tip is in the superior vena cava near the cavoatrial junction. Patchy airspace opacity on the right remains with areas of subtle cavitation, better delineated on recent CT. There is atelectatic change in the medial left lower lung region, similar to 1 day prior. Heart size and pulmonary vascularity are normal. No adenopathy. No appreciable bone lesions. IMPRESSION: Tube and catheter positions are unchanged. No pneumothorax. Persistent airspace opacity in the area of chest tube in the periphery of the left mid and lower lung regions. Subtle areas of cavitation at multiple sites on the right with multiple foci of airspace opacity on the right. Atelectasis left base. No new opacity evident. Stable cardiac silhouette. No adenopathy appreciable by radiography. Electronically Signed   By: Bretta Bang III M.D.   On: 01/09/2020 09:26   DG Chest Port 1 View  Result Date: 01/08/2020 CLINICAL DATA:  Evaluate pleural fluid and pneumothorax EXAM: PORTABLE CHEST 1 VIEW COMPARISON:  Five days ago FINDINGS: Chest drain on the left with no visible residual gas collection. There is adjacent pleural fluid/thickening along the lateral chest wall. Multiple pulmonary nodules, some cavitary (as along the paramediastinal left lower lobe). No apical pneumothorax. Stable heart size. Right PICC with tip at the upper cavoatrial junction. IMPRESSION: Improvement in fluid collection in the left chest after percutaneous drainage. No new abnormality. Electronically Signed   By: Marnee Spring M.D.   On: 01/08/2020 07:14    Scheduled Meds: . chlorhexidine  15 mL Mouth Rinse BID  . Chlorhexidine Gluconate Cloth  6 each Topical Daily  . clonazepam  0.25 mg Oral Daily  . collagenase   Topical Daily  . diclofenac Sodium  4 g Topical QID  . doxycycline  100 mg Oral Q12H  . escitalopram  20 mg Oral QHS  . feeding supplement  237 mL Oral TID BM  . gabapentin  300 mg Oral TID  . lidocaine  2 patch Transdermal Q24H  . mouth rinse   15 mL Mouth Rinse q12n4p  . methadone  20 mg Oral Q12H  . multivitamin with minerals  1 tablet Oral Daily  . pantoprazole  40 mg Oral Daily  . sodium chloride flush  10-40 mL Intracatheter Q12H  . sodium chloride flush  10-40 mL Intracatheter Q12H   Continuous Infusions: . sodium chloride 10 mL/hr at 12/05/19 0617  . sodium chloride Stopped (12/19/19 2156)  . DAPTOmycin (CUBICIN)  IV 500 mg (01/08/20 2138)    Principal Problem:  MRSA infection Active Problems:   Septic shock (HCC)   Opioid use with withdrawal (HCC)   Endocarditis of tricuspid valve   Palliative care encounter   Protein-calorie malnutrition, severe   Consultants:  PCCM  Infectious disease  CVTS  Cardiology  Palliative medicine  Procedures:  10/22 echocardiogram  10/24 TEE  11/17 complete echocardiogram  11/22 intraoperative TEE/application of angio [facility]  11/24 core track  11/29 Limited echocardiogram  Antibiotics: Anti-infectives (From admission, onward)   Start     Dose/Rate Route Frequency Ordered Stop   12/13/19 1115  doxycycline (VIBRA-TABS) tablet 100 mg        100 mg Oral Every 12 hours 12/13/19 1027 01/16/20 2359   12/07/19 2000  DAPTOmycin (CUBICIN) 500 mg in sodium chloride 0.9 % IVPB        500 mg 220 mL/hr over 30 Minutes Intravenous Daily 12/06/19 1345 01/16/20 2359   12/02/19 1400  vancomycin (VANCOREADY) IVPB 500 mg/100 mL  Status:  Discontinued        500 mg 100 mL/hr over 60 Minutes Intravenous Every 8 hours 12/02/19 1127 12/02/19 1131   12/02/19 1400  vancomycin (VANCOREADY) IVPB 750 mg/150 mL  Status:  Discontinued        750 mg 150 mL/hr over 60 Minutes Intravenous Every 8 hours 12/02/19 1131 12/06/19 1339   12/01/19 1200  vancomycin (VANCOREADY) IVPB 500 mg/100 mL  Status:  Discontinued        500 mg 100 mL/hr over 60 Minutes Intravenous Every 24 hours 11/30/19 1454 12/01/19 0946   12/01/19 1200  vancomycin (VANCOCIN) IVPB 1000 mg/200 mL premix  Status:   Discontinued        1,000 mg 200 mL/hr over 60 Minutes Intravenous Every 24 hours 12/01/19 0946 12/02/19 1127   11/30/19 1800  piperacillin-tazobactam (ZOSYN) IVPB 3.375 g  Status:  Discontinued        3.375 g 12.5 mL/hr over 240 Minutes Intravenous Every 8 hours 11/30/19 1454 12/02/19 0234   11/30/19 1115  piperacillin-tazobactam (ZOSYN) IVPB 3.375 g        3.375 g 100 mL/hr over 30 Minutes Intravenous  Once 11/30/19 1110 11/30/19 1306   11/30/19 1115  vancomycin (VANCOCIN) IVPB 1000 mg/200 mL premix        1,000 mg 200 mL/hr over 60 Minutes Intravenous  Once 11/30/19 1110 11/30/19 1337       Time spent: 30 minutes    [name] ANP  [facility] 7 am - 330 pm/M-F 40 days

## 2020-01-09 NOTE — Plan of Care (Signed)
  Problem: Education: Goal: Knowledge of General Education information will improve Description Including pain rating scale, medication(s)/side effects and non-pharmacologic comfort measures Outcome: Progressing   

## 2020-01-10 ENCOUNTER — Inpatient Hospital Stay (HOSPITAL_COMMUNITY): Payer: Self-pay

## 2020-01-10 IMAGING — DX DG CHEST 1V PORT
1 series · 1 of 1 positions shown · non-contrast
Comparison: CT chest [DATE], plain film [DATE]

CLINICAL DATA: 29-year-old female with prior chest tube

EXAM:
PORTABLE CHEST 1 VIEW

[chest ap]
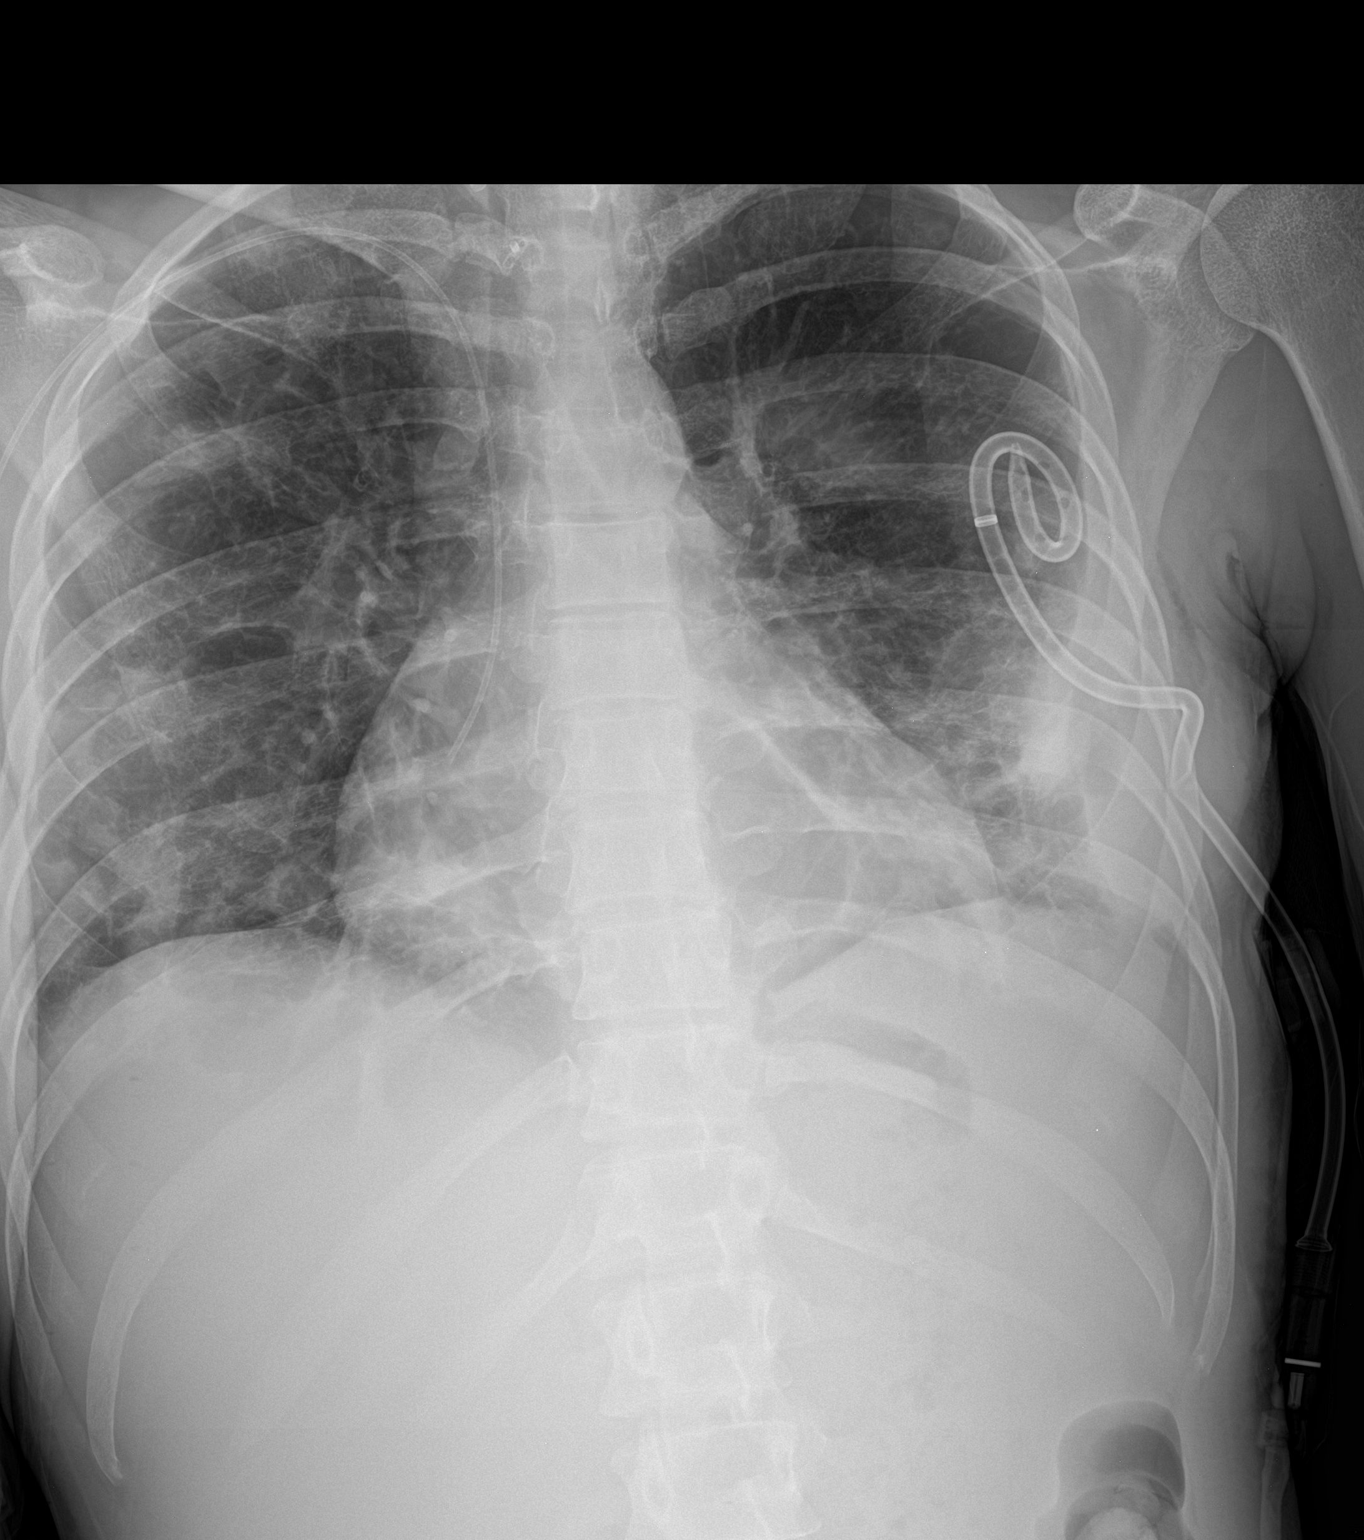

[1 of 1 positions shown; findings below may reference images not displayed]

FINDINGS: Cardiomediastinal silhouette unchanged in size and contour.

Similar appearance focal nodular opacities of the lungs as well as
pleuroparenchymal thickening along the left lateral pleura.
Unchanged position of left pigtail thoracostomy with a slight kink
at the chest wall and trance site.

No pneumothorax.

Unchanged right upper extremity PICC.
IMPRESSION: Unchanged appearance of the chest x-ray with persisting left pigtail
thoracostomy tube and residual lateral pleural thickening/fluid.

Unchanged right upper extremity PICC, and plain film appearance of
known infectious lung nodules.

## 2020-01-10 IMAGING — DX DG CHEST 1V
1 series · 1 of 1 positions shown · non-contrast
Comparison: Same day.

CLINICAL DATA: Chest tube removal.

EXAM:
CHEST  1 VIEW

[chest]
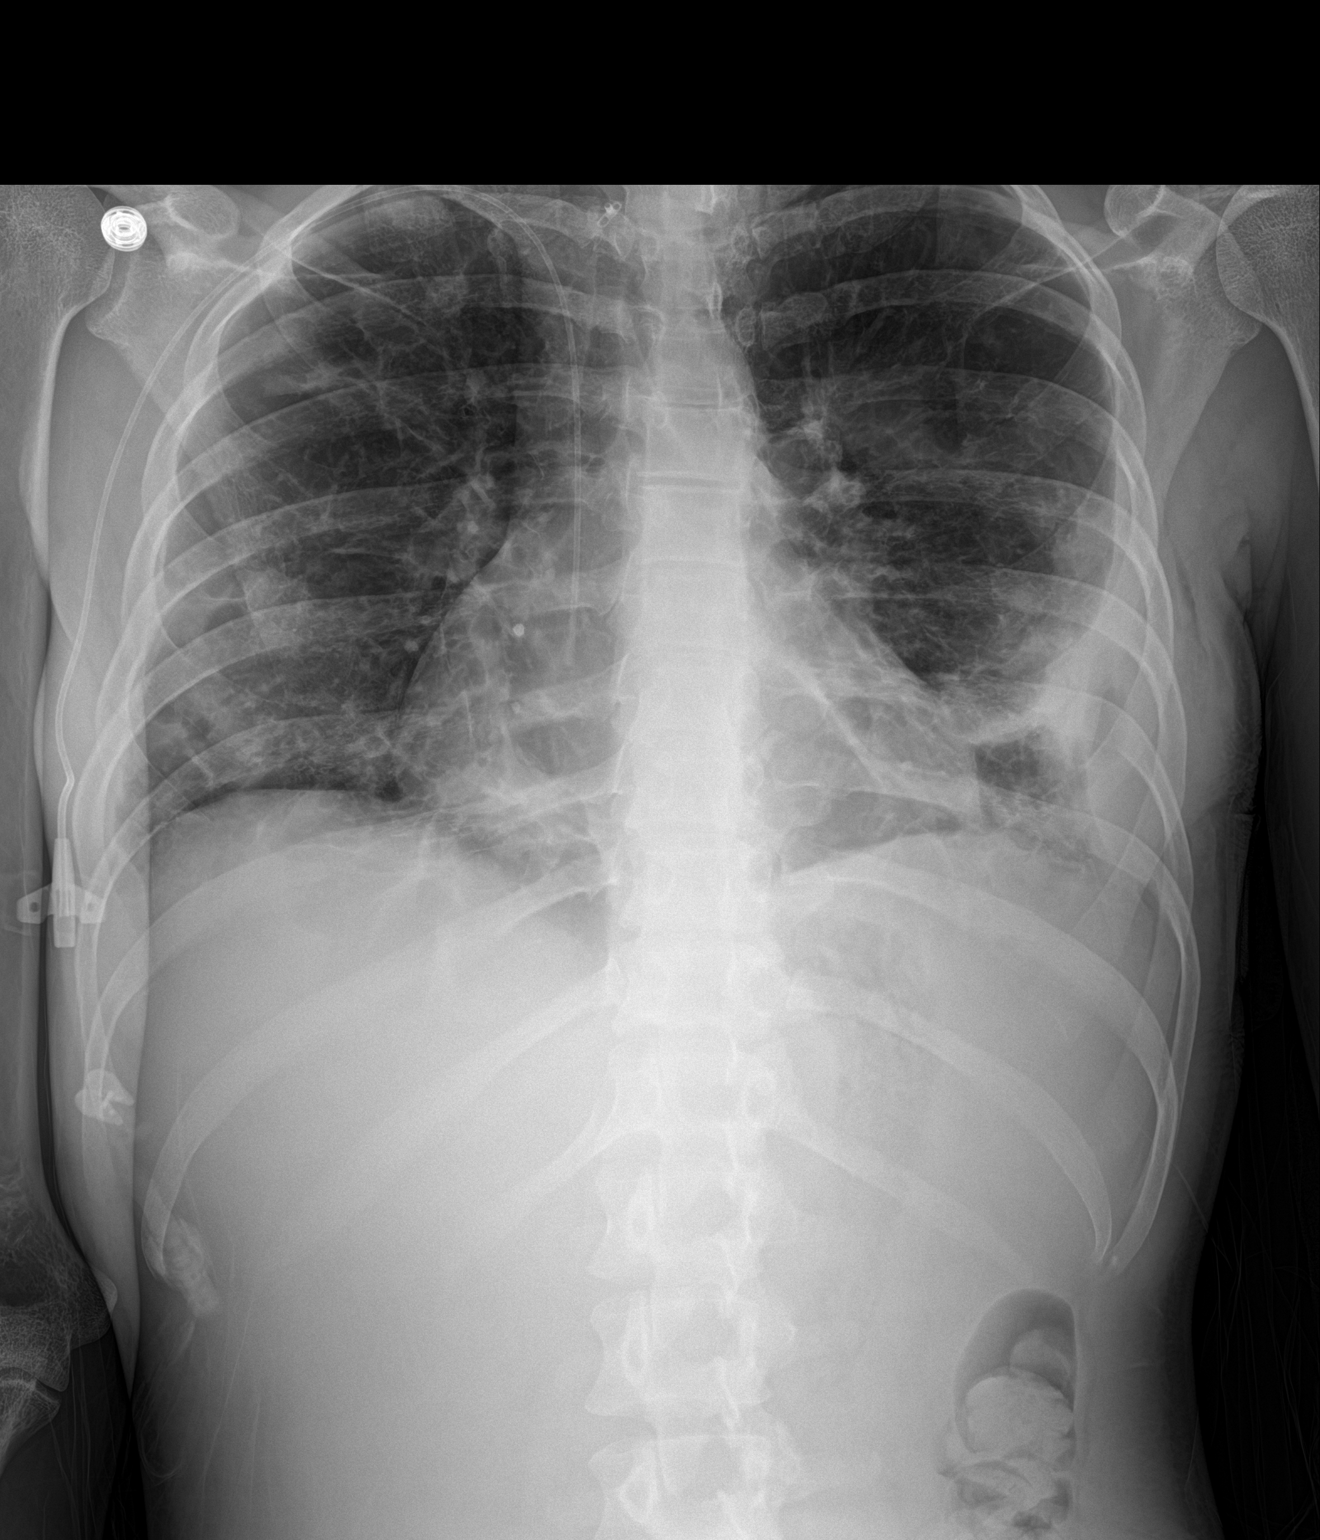

[1 of 1 positions shown; findings below may reference images not displayed]

FINDINGS: Stable cardiomediastinal silhouette. Left-sided chest tube has been
removed. No pneumothorax is noted. Right-sided PICC line is
unchanged. Stable pleural thickening or loculated pleural effusion
is noted laterally in left lung base. Stable other bilateral lung
opacities are noted concerning for multifocal inflammation. Bony
thorax is unremarkable.
IMPRESSION: Stable pleural thickening or loculated pleural effusion laterally in
left lung base. Stable other bilateral lung opacities are noted
concerning for multifocal inflammation. No pneumothorax is noted
status post chest tube removal.

## 2020-01-10 MED ORDER — OXYCODONE HCL 5 MG PO TABS
10.0000 mg | ORAL_TABLET | ORAL | Status: DC | PRN
  Administered 2020-01-10 – 2020-01-12 (×7): 10 mg via ORAL
  Filled 2020-01-10 (×8): qty 2

## 2020-01-10 NOTE — Progress Notes (Addendum)
[facility] PROGRESS NOTE  AZIZI BALLY JSE:[id number] DOB: 12/14/1990 DOA: 11/30/2019 PCP: Patient, No Pcp Per     12/15  Status: Remains inpatient appropriate because:Ongoing active pain requiring inpatient pain management, Ongoing diagnostic testing needed not appropriate for outpatient work up, Unsafe d/c plan, IV treatments appropriate due to intensity of illness or inability to take PO and Inpatient level of care appropriate due to severity of illness   Dispo: The patient is from: Home              Anticipated d/c is to: Home              Anticipated d/c date is: > 3 days (01/16/20)              Patient currently is not medically stable to d/c.  Patient's primary reason for remaining in the hospital is to complete 6 weeks of IV and oral antibiotics for her endocarditis with septic emboli.  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  Code Status: Full Family Communication: Patient; mother 12/28 DVT prophylaxis: SCDs 2/2 recent thrombocytopenia due to sepsis Vaccination status: Has not been vaccinated against Covid  Foley catheter: No  HPI: 29 year old female patient with known IV drug abuse with known tricuspid MRSA endocarditis who signed out AMA x2 prior to this admission.  She presented with progressive infection.  On 10/21 through 10/23 she was admitted with fever with blood cultures positive for MRSA.  CT of the chest demonstrated septic emboli and edematous L4-5 facet joint (? Evolving osteomyelitis) as well as tricuspid vegetation on CT.  She left AMA.  She returned 2 hours later on 10/23 with severe back pain.  Underwent TEE that demonstrated a 1 cm tricuspid valve mass.  She was evaluated by CT surgery who recommended medical management.  Again she left AMA on 10/25.  Patient was staying with friends but presented back to the  hospital due to increasing shortness of breath, weakness, fatigue malaise and nausea.  She arrived in the parking lot obtunded and was found to have multiple needles in her car on 11/17.  In the ER she was hypotensive and started on pressors.  Hemoglobin was low and she was given a blood transfusion.  She was also started on empiric IV vancomycin for known MRSA infection  and was admitted to the critical care service.  She eventually was taken to the OR for angio VAC during this admission.  She has subsequently transitioned out of ICU to stepdown unit.  She has had an IV infiltration and has a superficial wound on her left forearm. ID continues to follow regarding the endocarditis and recommends a total of 6 weeks therapy be daptomycin.  She has continued to have hypoxemia and productive cough and on 11/30 ID added doxycycline.  She is reporting pleuritic chest pain as well.  She is also very malnourished and was started on tube feedings and developed mild electrolyte disturbances from refeeding syndrome which have resolved.  She has low-grade resting tachycardia with no significant abnormalities on echo and cardiology previously did not suspect cardiac etiology to her tachycardia.  Hepatitis C antibody has been positive; she is HIV negative.  She has had persistent elevation in her LFTs.  She is currently on methadone 20 mg 3 times a day but will need to be on no more than 40 mg per 24 hours at time of  discharge to be eligible for outpatient methadone treatment.  Subjective: Less pain at catheter insertion site.  Has questions regarding the possibility of removal of chest tube later today.  Objective: Vitals:   01/09/20 2032 01/10/20 0458  BP: 113/65 108/70  Pulse: 99 92  Resp: 17 17  Temp: 98 F (36.7 C) 98.3 F (36.8 C)  SpO2: 100% 98%    Intake/Output Summary (Last 24 hours) at 01/10/2020 1255 Last data filed at 01/10/2020 0926 Gross per 24 hour  Intake --  Output 2 ml  Net -2 ml   Filed  Weights   12/31/19 0647 01/02/20 0500 01/04/20 0624  Weight: 41.4 kg 41.4 kg 42.1 kg    Exam: General: Alert, calm, no acute distress Pulmonary: Lung sounds are clear to auscultation on posterior exam.  Room air with O2 saturation 98%.  Pigtail catheter to close drainage system.   Cardiac: Pulses regular and nontachycardic at rest.  Heart sounds are S1-S2 without any murmurs or rubs. Abdomen: Bowel sounds are present, abdomen soft nontender nondistended.  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 Lightfoot's assistance.  -Pigtail catheter to close drainage system placed on 12/23 -Follow-up chest x-ray on 12/27 solution of pneumothorax.  There are persistent areas of opacity in the area of the chest tube with subtle areas of cavitation at multiple sites in the right with multiple foci of airspace opacity on the right.  Dr. Cliffton Asters has reviewed chest x-ray and recommends removal -12/27 IR consulted to remove chest tube-recommended follow-up CT chest which was stable therefore on 12/28 plan was to clamp chest tube for 3 hours and repeat chest x-ray and if no recurrence of pneumothorax plan is to discontinue chest tube -Has been requiring oxycodone 15 mg frequently as needed to manage chest tube pain.   Will need to taper and discontinue prior to discharge **1417 CT removed by IR-will decrease OXY to 10 mg  Acute hypoxemic respiratory failure (multifactorial) 1 acute sepsis 2 septic emboli with sequela of small pleural effusion -Sepsis physiology and hypoxemia resolved -Continue oral doxycycline for sequela from septic pulmonary emboli-will stop at discharge -Chest x-ray 12/21 revealed loculated pleural fluid possibly loculated pneumothorax-CT of the chest confirmed bronchopleural fistula -Encourage use of incentive spirometry  Persistent tachycardia/tricuspid valve insufficiency -Evaluated by CVTS and currently not a surgical candidate for cardiac valve replacement giving active IVDA prior to current admission (although was appropriate for angiovac surgery) -Echocardiogram: mild systolic LV dysfunction -cardiology felt this was  physiologic response to hypoalbuminemia/malnutrition, deconditioning and anemia. -Cardiology recommended to suppress physiologic tachycardia with beta-blockers nor utilize diuretics prophylactically due to increased risk of harm   Severe protein calorie malnutrition with associated refeeding syndrome -Initial BMI 17 patient briefly required tube feedings which have now been discontinued. -BMI continues to trend upward on regular diet with supplementation as below Ensure feeding supplements Nutrition Problem: Severe Malnutrition Etiology: acute illness (MRSA endocarditis) Signs/Symptoms: energy intake < or equal to  50% for > or equal to 5 days,percent weight loss (8.5% weight loss in less than 2 months) Percent weight loss: 8.5 % Interventions: Ensure Enlive (each supplement provides 350kcal and 20 grams of protein),Magic cup,MVI Estimated body mass index is 17.54 kg/m as calculated from the following:   Height as of this encounter: 5\' 1"  (1.549 m).   Weight as of this encounter: 42.1 kg.  Skin wound secondary to IV infiltration/right groin wound/right neck  incision     11/30/19                        12/20/19                         12/30/19  -ID also following along regarding this wound.  No indication to change current management and no indication to consult surgery -Dark eschar fell off on 12/10 as evidenced by pictures above wound continues to improve -Continue gabapentin 300 mg TID  Wound / Incision (Open or Dehisced) 12/10/19 Non-pressure wound Arm Left;Posterior;Lateral infiltration/phlebitis (Active)  Date First Assessed/Time First Assessed: 12/10/19 1915   Wound Type: Non-pressure wound  Location: Arm  Location Orientation: Left;Posterior;Lateral  Wound Description (Comments): infiltration/phlebitis    Assessments 12/10/2019 11:45 PM 01/10/2020  8:07 AM  Dressing Type Thin film;Non adherent;Gauze (Comment) Gauze (Comment)  Dressing Changed New --  Dressing Status Intact Clean;Dry;Intact  Dressing Change Frequency PRN Daily  Site / Wound Assessment Painful;Red;Bleeding Dressing in place / Unable to assess  Closure None --  Drainage Amount Minimal --  Drainage Description Serosanguineous --  Treatment Other (Comment) --     No Linked orders to display    Chronic IV drug abuse with opiates/recent withdrawal syndrome -12/13: Discussed with patient that plan is to decrease to discharge dose of methadone 40 mg daily by next Monday 12/20 -Pam has been decreased to 0.25 mg BID and dose will be decreased to once daily today  GAD -As noted above weaning and decreasing benzodiazepine -Discussed with patient's mother and as I have also noted patient has significant anxiety issues -12/10: Lexapro 10 mg HS initiated and as of 12/24 will increase to 20 mg -Given increased anxiety post chest tube placed will not complete Klonopin taper/DC this medication at this time      Other problems: Anemia of chronic disease/recent sepsis related thrombocytopenia -Thrombocytopenia has resolved -Hemoglobin stable around 8 -With initiation of  NSAIDs daily Protonix has been started  Hyponatremia -Current sodium stable at 133  Hep C antibody positive with persistent transaminitis -Currently has nonobstructive transaminitis that has improved with improvement in nutrition -As of 12/3 albumin 2.0, AST 52, ALT normal at 42 and total bilirubin has been normal -HCVRNA quantitative pending  LBP -Musculoskeletal in etiology noting no significant abnormalities on recent MRI -Continue supportive care with ibuprofen prn and lidocaine patch   Data Reviewed: Basic Metabolic Panel: Recent Labs  Lab 01/04/20 0325 01/09/20 0419  NA 133* 135  K 3.7 4.0  CL 97* 100  CO2 28 27  GLUCOSE 87 110*  BUN 10 7  CREATININE 0.50 0.49  CALCIUM 8.7* 8.8*   Liver Function Tests: No results for input(s): AST, ALT, ALKPHOS, BILITOT, PROT, ALBUMIN in the last 168 hours. No results for input(s): LIPASE, AMYLASE in the last 168 hours. No results for input(s): AMMONIA in the last 168 hours. CBC: Recent Labs  Lab 01/04/20 0325 01/09/20 0419  WBC 11.4* 7.6  HGB 9.1* 9.2*  HCT 29.4* 31.8*  MCV 88.8 89.1  PLT 224 289   Cardiac Enzymes: Recent Labs  Lab 01/05/20 0317  CKTOTAL 24*   BNP (last 3 results) Recent Labs    11/30/19 1809  BNP 583.0*    ProBNP (last 3 results) No results for input(s): PROBNP in the last 8760 hours.  CBG: No results for input(s): GLUCAP in the last 168 hours.  Recent Results (from the past 240 hour(s))  Body fluid culture     Status: None   Collection Time: 01/05/20  2:56 PM   Specimen: Pleura  Result Value Ref Range Status   Specimen Description PLEURAL FLUID  Final   Special Requests LEFT CHEST  Final   Gram Stain NO WBC SEEN NO ORGANISMS SEEN   Final   Culture   Final    NO GROWTH Performed at [facility], [address] [place], [place] [address]    Report Status 01/08/2020 FINAL  Final     Studies: DG Chest 2 View  Result Date: 01/09/2020 CLINICAL DATA:  Recent chest tube  placement EXAM: CHEST - 2 VIEW COMPARISON:  January 08, 2020 chest radiograph; chest CT January 03, 2020 FINDINGS: Chest tube again noted on the left. No appreciable pneumothorax. Consolidation along the lateral aspect of the left lung appears stable. There is mild subcutaneous air on the left. Central catheter tip is in the superior vena cava near the cavoatrial junction. Patchy airspace opacity on the right remains with areas of subtle cavitation, better delineated on recent CT. There is atelectatic change in the medial left lower lung region, similar to 1 day prior. Heart size and pulmonary vascularity are normal. No adenopathy. No appreciable bone lesions. IMPRESSION: Tube and catheter positions are unchanged. No pneumothorax. Persistent airspace opacity in the area of chest tube in the periphery of the left mid and lower lung regions. Subtle areas of cavitation at multiple sites on the right with multiple foci of airspace opacity on the right. Atelectasis left base. No new opacity evident. Stable cardiac silhouette. No adenopathy appreciable by radiography. Electronically Signed   By: Bretta Bang III M.D.   On: 01/09/2020 09:26   CT CHEST WO CONTRAST  Result Date: 01/09/2020 CLINICAL DATA:  History of LEFT hydropneumothorax status post LEFT chest tube placement on 01/05/2020. EXAM: CT CHEST WITHOUT CONTRAST TECHNIQUE: Multidetector CT imaging of the chest was performed following the standard protocol without IV contrast. COMPARISON:  Chest CT dated 01/03/2020. FINDINGS: Cardiovascular: Thoracic aorta is normal in caliber and configuration. No pericardial effusion. Mediastinum/Nodes: No mass or enlarged lymph nodes are seen within the mediastinum. Trachea and central bronchi are unremarkable. Lungs/Pleura: LEFT-sided chest tube in place. Chest tube is well positioned within the previously described complex collection of fluid and air in the LEFT pleural space. This component of the LEFT-sided  pleural collection is nearly completely resolved. There remains complex loculated-appearing collections of fluid and air along the posterior and medial aspects of the LEFT lower lobe, measuring 4 cm and 5 cm greatest dimension respectively. The additional previous described multiple patchy/nodular opacities within the bilateral lungs, some cavitary, are not significantly changed in the short-term interval, compatible with atypical pneumonia versus septic emboli. Upper Abdomen: Limited images of the upper abdomen are unremarkable. Musculoskeletal: No acute appearing osseous abnormality IMPRESSION: 1. LEFT-sided chest tube is well positioned within the previously described complex collection of fluid and air in the LEFT pleural space. This component of the LEFT-sided pleural collection is nearly completely resolved.  2. There remains complex loculated-appearing collections of fluid and air along the posterior and medial aspects of the LEFT lower lobe, largest component at the medial aspect of the LEFT lung base measuring 5 cm greatest dimension. These may be secondary to bronchopleural fistula, as previously suggested. 3. Multiple patchy/nodular consolidations are again seen throughout the bilateral lungs, some cavitary, not significantly changed in the short-term interval, compatible with atypical pneumonia versus septic emboli. Favor septic emboli based on clinical data of drug abuse provided on previous exams. Electronically Signed   By: Bary RichardStan  Maynard M.D.   On: 01/09/2020 17:51    Scheduled Meds: . (feeding supplement) PROSource Plus  30 mL Oral TID BM  . chlorhexidine  15 mL Mouth Rinse BID  . Chlorhexidine Gluconate Cloth  6 each Topical Daily  . clonazepam  0.25 mg Oral Daily  . collagenase   Topical Daily  . diclofenac Sodium  4 g Topical QID  . doxycycline  100 mg Oral Q12H  . escitalopram  20 mg Oral QHS  . feeding supplement  237 mL Oral TID BM  . gabapentin  300 mg Oral TID  . lidocaine  2  patch Transdermal Q24H  . mouth rinse  15 mL Mouth Rinse q12n4p  . methadone  20 mg Oral Q12H  . multivitamin with minerals  1 tablet Oral Daily  . pantoprazole  40 mg Oral Daily  . sodium chloride flush  10-40 mL Intracatheter Q12H  . sodium chloride flush  10-40 mL Intracatheter Q12H   Continuous Infusions: . sodium chloride 10 mL/hr at 12/05/19 0617  . sodium chloride Stopped (12/19/19 2156)  . DAPTOmycin (CUBICIN)  IV 500 mg (01/09/20 2011)    Principal Problem:   MRSA infection Active Problems:   Septic shock (HCC)   Opioid use with withdrawal (HCC)   Endocarditis of tricuspid valve   Palliative care encounter   Protein-calorie malnutrition, severe   Consultants:  PCCM  Infectious disease  CVTS  Cardiology  Palliative medicine  Procedures:  10/22 echocardiogram  10/24 TEE  11/17 complete echocardiogram  11/22 intraoperative TEE[facility]  11/24 core track  11/29 Limited echocardiogram  Antibiotics: Anti-infectives (From admission, onward)   Start     Dose/Rate Route Frequency Ordered Stop   12/13/19 1115  doxycycline (VIBRA-TABS) tablet 100 mg        100 mg Oral Every 12 hours 12/13/19 1027 01/16/20 2359   12/07/19 2000  DAPTOmycin (CUBICIN) 500 mg in sodium chloride 0.9 % IVPB        500 mg 220 mL/hr over 30 Minutes Intravenous Daily 12/06/19 1345 01/16/20 2359   12/02/19 1400  vancomycin (VANCOREADY) IVPB 500 mg/100 mL  Status:  Discontinued        500 mg 100 mL/hr over 60 Minutes Intravenous Every 8 hours 12/02/19 1127 12/02/19 1131   12/02/19 1400  vancomycin (VANCOREADY) IVPB 750 mg/150 mL  Status:  Discontinued        750 mg 150 mL/hr over 60 Minutes Intravenous Every 8 hours 12/02/19 1131 12/06/19 1339   12/01/19 1200  vancomycin (VANCOREADY) IVPB 500 mg/100 mL  Status:  Discontinued        500 mg 100 mL/hr over 60 Minutes Intravenous Every 24 hours 11/30/19 1454 12/01/19 0946   12/01/19 1200  vancomycin (VANCOCIN) IVPB  1000 mg/200 mL premix  Status:  Discontinued        1,000 mg 200 mL/hr over 60 Minutes Intravenous Every 24 hours 12/01/19 0946 12/02/19 1127  11/30/19 1800  piperacillin-tazobactam (ZOSYN) IVPB 3.375 g  Status:  Discontinued        3.375 g 12.5 mL/hr over 240 Minutes Intravenous Every 8 hours 11/30/19 1454 12/02/19 0234   11/30/19 1115  piperacillin-tazobactam (ZOSYN) IVPB 3.375 g        3.375 g 100 mL/hr over 30 Minutes Intravenous  Once 11/30/19 1110 11/30/19 1306   11/30/19 1115  vancomycin (VANCOCIN) IVPB 1000 mg/200 mL premix        1,000 mg 200 mL/hr over 60 Minutes Intravenous  Once 11/30/19 1110 11/30/19 1337       Time spent: 20 minutes    [name] ANP  [facility] 7 am - 330 pm/M-F 41 days

## 2020-01-10 NOTE — Progress Notes (Signed)
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 Patient awake and alert sitting in bed eating breakfast with no complaints at this time. Again refusing to let me remove chest tube dressing secondary to "possible pain with touching".  CT chest 01/09/2020: 1. LEFT-sided chest tube is well positioned within the previously described complex collection of fluid and air in the LEFT pleural space. This component of the LEFT-sided pleural collection is nearly completely resolved.  2. There remains complex loculated-appearing collections of fluid and air along the posterior and medial aspects of the LEFT lower lobe, largest component at the medial aspect of the LEFT lung base measuring 5 cm greatest dimension. These may be secondary to bronchopleural fistula, as previously suggested. 3. Multiple patchy/nodular consolidations are again seen throughout the bilateral lungs, some cavitary, not significantly changed in the short-term interval, compatible with atypical pneumonia versus septic emboli. Favor septic emboli based on clinical data of drug abuse provided on previous exams.   Allergies: Bee venom  Medications: Prior to Admission medications   Medication Sig Start Date End Date Taking? Authorizing Provider  acetaminophen (TYLENOL) 500 MG tablet Take 1,000 mg by mouth every 6 (six) hours as needed for headache.    Yes [provider]  ibuprofen (ADVIL) 600 MG tablet Take 1 tablet (600 mg total) by mouth every 6 (six) hours as needed. Patient not taking: Reported on 11/03/2019 08/20/18   Fayrene Helper, PA-C  ondansetron (ZOFRAN ODT) 4 MG disintegrating tablet Take 1 tablet (4 mg total) by mouth every 8 (eight) hours as needed for nausea or  vomiting. Patient not taking: Reported on 11/03/2019 01/11/19   Liberty Handy, PA-C     Vital Signs: BP 108/70 (BP Location: Left Leg)   Pulse 92   Temp 98.3 F (36.8 C)   Resp 17   Ht 5\' 1"  (1.549 m)   Wt 92 lb 13 oz (42.1 kg)   LMP  (LMP Unknown)   SpO2 98%   BMI 17.54 kg/m   Physical Exam Constitutional:      General: She is not in acute distress. Pulmonary:     Effort: Pulmonary effort is normal. No respiratory distress.     Comments: On RA. Unable to assess chest tube insertion site secondary to patient refusal; pleure-vac with approximately 120 cc of serosanguinous fluid; tube to suction with (-) air leak- tube was taken off suction today (switched to water seal). Skin:    General: Skin is warm and dry.  Neurological:     Mental Status: She is alert and oriented to person, place, and time.     Imaging: DG Chest 2 View  Result Date: 01/09/2020 CLINICAL DATA:  Recent chest tube placement EXAM: CHEST - 2 VIEW COMPARISON:  January 08, 2020 chest radiograph; chest CT January 03, 2020 FINDINGS: Chest tube again noted on the left. No appreciable pneumothorax. Consolidation along the lateral aspect of the left lung appears stable. There is mild subcutaneous air on the left. Central catheter tip is in the superior vena cava near the cavoatrial junction. Patchy airspace opacity on the right remains with areas of subtle cavitation, better delineated on recent CT. There is atelectatic change in the medial left lower lung region, similar to 1 day prior. Heart size and pulmonary vascularity are normal. No  adenopathy. No appreciable bone lesions. IMPRESSION: Tube and catheter positions are unchanged. No pneumothorax. Persistent airspace opacity in the area of chest tube in the periphery of the left mid and lower lung regions. Subtle areas of cavitation at multiple sites on the right with multiple foci of airspace opacity on the right. Atelectasis left base. No new opacity evident.  Stable cardiac silhouette. No adenopathy appreciable by radiography. Electronically Signed   By: Bretta Bang III M.D.   On: 01/09/2020 09:26   CT CHEST WO CONTRAST  Result Date: 01/09/2020 CLINICAL DATA:  History of LEFT hydropneumothorax status post LEFT chest tube placement on 01/05/2020. EXAM: CT CHEST WITHOUT CONTRAST TECHNIQUE: Multidetector CT imaging of the chest was performed following the standard protocol without IV contrast. COMPARISON:  Chest CT dated 01/03/2020. FINDINGS: Cardiovascular: Thoracic aorta is normal in caliber and configuration. No pericardial effusion. Mediastinum/Nodes: No mass or enlarged lymph nodes are seen within the mediastinum. Trachea and central bronchi are unremarkable. Lungs/Pleura: LEFT-sided chest tube in place. Chest tube is well positioned within the previously described complex collection of fluid and air in the LEFT pleural space. This component of the LEFT-sided pleural collection is nearly completely resolved. There remains complex loculated-appearing collections of fluid and air along the posterior and medial aspects of the LEFT lower lobe, measuring 4 cm and 5 cm greatest dimension respectively. The additional previous described multiple patchy/nodular opacities within the bilateral lungs, some cavitary, are not significantly changed in the short-term interval, compatible with atypical pneumonia versus septic emboli. Upper Abdomen: Limited images of the upper abdomen are unremarkable. Musculoskeletal: No acute appearing osseous abnormality IMPRESSION: 1. LEFT-sided chest tube is well positioned within the previously described complex collection of fluid and air in the LEFT pleural space. This component of the LEFT-sided pleural collection is nearly completely resolved. 2. There remains complex loculated-appearing collections of fluid and air along the posterior and medial aspects of the LEFT lower lobe, largest component at the medial aspect of the LEFT lung  base measuring 5 cm greatest dimension. These may be secondary to bronchopleural fistula, as previously suggested. 3. Multiple patchy/nodular consolidations are again seen throughout the bilateral lungs, some cavitary, not significantly changed in the short-term interval, compatible with atypical pneumonia versus septic emboli. Favor septic emboli based on clinical data of drug abuse provided on previous exams. Electronically Signed   By: Bary Richard M.D.   On: 01/09/2020 17:51   DG Chest Port 1 View  Result Date: 01/08/2020 CLINICAL DATA:  Evaluate pleural fluid and pneumothorax EXAM: PORTABLE CHEST 1 VIEW COMPARISON:  Five days ago FINDINGS: Chest drain on the left with no visible residual gas collection. There is adjacent pleural fluid/thickening along the lateral chest wall. Multiple pulmonary nodules, some cavitary (as along the paramediastinal left lower lobe). No apical pneumothorax. Stable heart size. Right PICC with tip at the upper cavoatrial junction. IMPRESSION: Improvement in fluid collection in the left chest after percutaneous drainage. No new abnormality. Electronically Signed   By: Marnee Spring M.D.   On: 01/08/2020 07:14    Labs:  CBC: Recent Labs    12/20/19 0416 12/24/19 0325 01/04/20 0325 01/09/20 0419  WBC 6.7 6.6 11.4* 7.6  HGB 8.2* 7.6* 9.1* 9.2*  HCT 28.2* 25.8* 29.4* 31.8*  PLT 187 257 224 289    COAGS: Recent Labs    11/03/19 0950 11/04/19 0434 11/30/19 1226 01/05/20 0800  INR 1.3* 1.5* 1.7* 1.3*  APTT 32  --  34  --  BMP: Recent Labs    01/10/19 2200 11/03/19 0950 12/24/19 0325 12/28/19 0530 01/04/20 0325 01/09/20 0419  NA 128*   < > 136 136 133* 135  K 3.8   < > 3.8 4.0 3.7 4.0  CL 94*   < > 100 97* 97* 100  CO2 22   < > 28 31 28 27   GLUCOSE 185*   < > 76 124* 87 110*  BUN 20   < > 6 8 10 7   CALCIUM 8.9   < > 8.5* 8.8* 8.7* 8.8*  CREATININE 0.86   < > 0.35* 0.41* 0.50 0.49  GFRNONAA >60   < > >60 >60 >60 >60  GFRAA >60  --   --    --   --   --    < > = values in this interval not displayed.    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 Left chest tube stable with approximately 120 serosanguinous fluid in pleure-vac, tube to suction with (-) air leak. CT chest reviewed today by Dr. 14/03/21 who recommends water seal trial x 3 hours (followed by CXR) prior to removal- suction turned off tube. Patient aware to call RN if develops dyspnea, RN aware to turn suction back on if this occurs. Continue current tube management- tube to water seal, will check CXR in 3 hours regarding possible removal. Further plans per [facility]- appreciate and agree with management. IR to follow.   Electronically Signed: Archer Asa, PA-C 01/10/2020, 10:22 AM   I spent a total of 25 Minutes at the the patient's bedside AND on the patient's hospital floor or unit, greater than 50% of which was counseling/coordinating care for left hydropneumothorax s/p left chest tube placement.

## 2020-01-10 NOTE — Progress Notes (Signed)
IR.  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  Post-water seal trial CXR reviewed by Dr. Archer Asa who recommends left chest tube removal. Left chest tube removed bedside, removed intact. Pressure dressing (petrollum jelly gauze, gauze, and tegaderm) applied to site. Post-removal CXR ordered. Will make Amber Silk, NP aware.  Please call IR with questions/concerns.   Amber Boga Pamelia Botto, PA-C 01/10/2020, 2:23 PM

## 2020-01-10 NOTE — Plan of Care (Signed)

## 2020-01-11 ENCOUNTER — Other Ambulatory Visit (HOSPITAL_COMMUNITY): Payer: Self-pay | Admitting: Nurse Practitioner

## 2020-01-11 LAB — CK: Total CK: 23 U/L — ABNORMAL LOW (ref 38–234)

## 2020-01-11 MED ORDER — ESCITALOPRAM OXALATE 20 MG PO TABS: 20.0000 mg | ORAL_TABLET | Freq: Every day | ORAL | 6 refills | Status: AC

## 2020-01-11 MED ORDER — ADULT MULTIVITAMIN W/MINERALS CH: 1.0000 | ORAL_TABLET | Freq: Every day | ORAL | 6 refills | Status: DC

## 2020-01-11 MED ORDER — METHADONE HCL 10 MG PO TABS: 20.0000 mg | ORAL_TABLET | Freq: Two times a day (BID) | ORAL | 0 refills | Status: DC

## 2020-01-11 MED ORDER — GABAPENTIN 600 MG PO TABS: 300.0000 mg | ORAL_TABLET | Freq: Three times a day (TID) | ORAL | 6 refills | Status: DC

## 2020-01-11 MED FILL — METHADONE HCL 10 MG TABLET: 10 | 7 days supply | Qty: 30 | Fill #0

## 2020-01-11 MED FILL — GABAPENTIN 600 MG TABLET: 600 | 30 days supply | Qty: 45 | Fill #0

## 2020-01-11 MED FILL — ESCITALOPRAM 20 MG TABLET: 20 | 30 days supply | Qty: 30 | Fill #0

## 2020-01-11 NOTE — Progress Notes (Signed)
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 PMhx: admission 10/23-10/26 for endocarditis left AMA, IVDU, cellulitis, depression    PT Comments    Patient currently modI for bed mobility with HOB elevated and sit to stand t/f with RW. Patient ambulated 150' in room with RW and supervision, initially L toe touch due to pain but progressed to foot flat. Patient continues to be limited by decreased activity tolerance, L hip pain, generalized weakness. Recommend OPPT following discharge for strengthening and return to PLOF.    Follow Up Recommendations  Outpatient PT     Equipment Recommendations  Rolling Kele Barthelemy with 5" wheels    Recommendations for Other Services       Precautions / Restrictions Precautions Precautions: Fall Precaution Comments: contact Restrictions Weight Bearing Restrictions: No    Mobility  Bed Mobility Overal bed mobility: Modified Independent                Transfers Overall transfer level: Modified independent Equipment used: Rolling Merilynn Haydu (2 wheeled)                Ambulation/Gait Ambulation/Gait assistance: Supervision Gait Distance (Feet): 150 Feet Assistive device: Rolling Shem Plemmons (2 wheeled) Gait Pattern/deviations: Step-through pattern;Decreased stride length     General Gait Details: supervision for safety, initially ambulating with L toe touch but progressed to foot flat. Patient request to ambulate in the room. Discussed about attempting stairs next session for safe d/c home   Stairs             Wheelchair Mobility    Modified Rankin (Stroke Patients Only)       Balance Overall balance assessment: Modified Independent                                          Cognition  Arousal/Alertness: Awake/alert Behavior During Therapy: WFL for tasks assessed/performed Overall Cognitive Status: Within Functional Limits for tasks assessed                                        Exercises      General Comments        Pertinent Vitals/Pain Pain Assessment: Faces Faces Pain Scale: Hurts a little bit Pain Location: L hip Pain Descriptors / Indicators: Discomfort;Guarding Pain Intervention(s): Monitored during session    Home Living                      Prior Function            PT Goals (current goals can now be found in the care plan section) Acute Rehab PT Goals Patient Stated Goal: to go home PT Goal Formulation: With patient Time For Goal Achievement: 01/13/20 Potential to Achieve Goals: Good Progress towards PT goals: Progressing toward goals    Frequency    Min 3X/week      PT Plan Current plan remains appropriate    Co-evaluation              AM-PAC PT "6 Clicks" Mobility   Outcome Measure  Help needed turning from your back to your side  while in a flat bed without using bedrails?: None Help needed moving from lying on your back to sitting on the side of a flat bed without using bedrails?: None Help needed moving to and from a bed to a chair (including a wheelchair)?: None Help needed standing up from a chair using your arms (e.g., wheelchair or bedside chair)?: None Help needed to walk in hospital room?: A Little Help needed climbing 3-5 steps with a railing? : A Little 6 Click Score: 22    End of Session   Activity Tolerance: Patient tolerated treatment well Patient left: in bed;with call bell/phone within reach Nurse Communication: Mobility status PT Visit Diagnosis: Difficulty in walking, not elsewhere classified (R26.2);Pain Pain - Right/Left: Left Pain - part of body: Hip     Time: 0165-5374 PT Time Calculation (min) (ACUTE ONLY): 13 min  Charges:  $Therapeutic Activity: 8-22 mins                      Gregor Hams, PT, DPT Acute Rehabilitation Services Pager [phone] Office [phone]    Amber [name] 01/11/2020, 10:02 AM

## 2020-01-11 NOTE — Progress Notes (Signed)
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 PMhx: admission 10/23-10/26 for endocarditis left AMA, IVDU, cellulitis, depression   OT comments  Pt progressing towards OT goals this session, performing standing grooming at sink, required min A for LLE dressing due to hip pain. MOd I for bed mobility and supervision for in room mobility with RW. Current POC remains appropriate.    Follow Up Recommendations  No OT follow up    Equipment Recommendations  None recommended by OT    Recommendations for Other Services      Precautions / Restrictions Precautions Precautions: Fall Precaution Comments: contact Restrictions Weight Bearing Restrictions: No       Mobility Bed Mobility Overal bed mobility: Modified Independent                Transfers Overall transfer level: Modified independent Equipment used: Rolling walker (2 wheeled)             General transfer comment: good hand placement    Balance Overall balance assessment: Modified Independent                                         ADL either performed or assessed with clinical judgement   ADL Overall ADL's : Needs assistance/impaired     Grooming: Wash/dry hands;Wash/dry face;Oral care;Min guard;Standing Grooming Details (indicate cue type and reason): sink level             Lower Body Dressing: Sit to/from stand;Minimal assistance Lower Body Dressing Details (indicate cue type and reason): to get sock started on L foot Toilet Transfer: RW;Supervision/safety;Ambulation;Regular Toilet;Grab bars Toilet Transfer Details (indicate cue type and reason): into bathroom Toileting- Clothing Manipulation and Hygiene: Supervision/safety;Sit to/from stand        Functional mobility during ADLs: Supervision/safety;Rolling walker       Vision       Perception     Praxis      Cognition Arousal/Alertness: Awake/alert Behavior During Therapy: WFL for tasks assessed/performed Overall Cognitive Status: Within Functional Limits for tasks assessed                                 General Comments: Pt more motivated to work with therapy today        Exercises     Shoulder Instructions       General Comments overall increased motivation to participate in therapies    Pertinent Vitals/ Pain       Pain Assessment: Faces Faces Pain Scale: Hurts a little bit Pain Location: L hip Pain Descriptors / Indicators: Discomfort;Guarding Pain Intervention(s): Monitored during session;Repositioned  Home Living                                          Prior Functioning/Environment              Frequency  Min 2X/week        Progress Toward Goals  OT Goals(current goals can now be found in the care plan section)  Progress towards OT goals: Progressing toward  goals  Acute Rehab OT Goals Patient Stated Goal: to go home before Bday OT Goal Formulation: With patient Time For Goal Achievement: 01/25/20 Potential to Achieve Goals: Good  Plan Discharge plan remains appropriate    Co-evaluation                 AM-PAC OT "6 Clicks" Daily Activity     Outcome Measure   Help from another person eating meals?: None Help from another person taking care of personal grooming?: A Little Help from another person toileting, which includes using toliet, bedpan, or urinal?: A Little Help from another person bathing (including washing, rinsing, drying)?: A Little Help from another person to put on and taking off regular upper body clothing?: None Help from another person to put on and taking off regular lower body clothing?: A Little 6 Click Score: 20    End of Session Equipment Utilized During  Treatment: Rolling walker  OT Visit Diagnosis: Unsteadiness on feet (R26.81);Other abnormalities of gait and mobility (R26.89);Muscle weakness (generalized) (M62.81)   Activity Tolerance Patient tolerated treatment well   Patient Left in bed;with call bell/phone within reach   Nurse Communication Mobility status        Time: 4696-2952 OT Time Calculation (min): 22 min  Charges: OT General Charges $OT Visit: 1 Visit OT Treatments $Self Care/Home Management : 8-22 mins  Amber [name] OTR/L Acute Rehabilitation Services Pager: [phone] Office: [phone]   Amber [name] 01/11/2020, 12:30 PM

## 2020-01-11 NOTE — Progress Notes (Addendum)
[facility] PROGRESS NOTE ADDENDUM I have seen and examined this patient myself. I have also discussed this patient in detail with [name], NP. I have reviewed her note. I am in agreement with her evaluation and plan.   Amber [name] QQP:[id number] DOB: 09-17-1990 DOA: 11/30/2019 PCP: Patient, No Pcp Per     12/15  Status: Remains inpatient appropriate because:Ongoing active pain requiring inpatient pain management, Ongoing diagnostic testing needed not appropriate for outpatient work up, Unsafe d/c plan, IV treatments appropriate due to intensity of illness or inability to take PO and Inpatient level of care appropriate due to severity of illness   Dispo: The patient is from: Home              Anticipated d/c is to: Home              Anticipated d/c date is: > 3 days (01/16/20)              Patient currently is not medically stable to d/c.  Patient's primary reason for remaining in the hospital is to complete 6 weeks of IV and oral antibiotics for her endocarditis with septic emboli.  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  Code Status: Full Family Communication: Patient; mother 12/28 DVT prophylaxis: SCDs 2/2 recent thrombocytopenia due to sepsis Vaccination status: Has not been vaccinated against Covid  Foley catheter: No  HPI: 29 year old female patient with known IV drug abuse with known tricuspid MRSA endocarditis who signed out AMA x2 prior to this admission.  She presented with progressive infection.  On 10/21 through 10/23 she was admitted with fever with blood cultures positive for MRSA.  CT of the chest demonstrated septic emboli and edematous L4-5 facet joint (? Evolving osteomyelitis) as well as tricuspid vegetation on CT.  She left AMA.  She returned 2 hours later on 10/23 with severe back pain.  Underwent TEE that  demonstrated a 1 cm tricuspid valve mass.  She was evaluated by CT surgery who recommended medical management.  Again she left AMA on 10/25.  Patient was staying with friends but presented back to the hospital due to increasing shortness of breath, weakness, fatigue malaise and nausea.  She arrived in the parking lot obtunded and was found to have multiple needles in her car on 11/17.  In the ER she was hypotensive and started on pressors.  Hemoglobin was low and she was given a blood transfusion.  She was also started on empiric IV vancomycin for known MRSA infection  and was admitted to the critical care service.  She eventually was taken to the OR for angio VAC during this admission.  She has subsequently transitioned out of ICU to stepdown unit.  She has had an IV infiltration and has a superficial wound on her left forearm. ID continues to follow regarding the endocarditis and recommends a total of 6 weeks therapy be daptomycin.  She has continued to have hypoxemia and productive cough and on 11/30 ID added doxycycline.  She is reporting pleuritic chest pain as well.  She is also very malnourished and was started on tube feedings and developed mild electrolyte disturbances from refeeding syndrome which have resolved.  She has low-grade resting tachycardia with no significant abnormalities on echo and cardiology previously did not suspect cardiac etiology to her tachycardia.  Hepatitis C antibody has been positive; she is HIV negative.  She  has had persistent elevation in her LFTs.  She is currently on methadone 20 mg 3 times a day but will need to be on no more than 40 mg per 24 hours at time of discharge to be eligible for outpatient methadone treatment.  Subjective: No specific complaints.  States left arm remains sore but pain is tolerable.  To discharge home next week.  Objective: Vitals:   01/10/20 2041 01/11/20 0436  BP: 110/69 109/73  Pulse: 98 89  Resp: 18 17  Temp: 98 F (36.7 C) 98.2 F  (36.8 C)  SpO2: 99% 97%    Intake/Output Summary (Last 24 hours) at 01/11/2020 1319 Last data filed at 01/11/2020 0941 Gross per 24 hour  Intake 480 ml  Output 2 ml  Net 478 ml   Filed Weights   12/31/19 0647 01/02/20 0500 01/04/20 0624  Weight: 41.4 kg 41.4 kg 42.1 kg    Exam: General: Wake, no acute distress Pulmonary: Bilateral lung sounds clear to auscultation anteriorly, slightly tender over previous left lateral chest tube site.  Room air sats stable at 97% Cardiac: Heart sounds S1-S2 without murmurs including over tricuspid area, no JVD and no peripheral edema.  Pulses regular and nontachycardic at rest Abdomen: Soft nontender nondistended with normoactive bowel sounds auscultated.Marland KitchenLBM 12/28 Extremities: Symmetrical, LUE dressing clean dry and intact    Assessment/Plan: Acute problems: Septic shock 2/2 MRSA bacteremia and associated TV endocarditis status post angio VAC/abnormal lumbar imaging -ID commended 42 days of IV daptomycin via PICC inserted 11/30 (last dose should be 01/16/20)-CK stable -ID following  -Repeat echo 11/29 shows resolution of endocarditis but persistent severe tricuspid regurgitation -Repeat lumbar MRI revealed no evidence of infection in lumbar spine or other significant abnormality  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 Lightfoot's assistance.  -Pigtail catheter to close drainage system placed on 12/23 -Chest tube removed on 12/28 with follow-up chest x-ray unremarkable -Short acting Oxy decreased from 15 mg to 10 mg on 12/28.  On 12/30 will decrease to 5 mg and continue for 2 days then discontinue noting patient currently on chronic methadone.  Acute hypoxemic respiratory failure (multifactorial) 1 acute sepsis 2 septic emboli with sequela of small pleural effusion -Sepsis physiology and hypoxemia  resolved -Continue oral doxycycline for sequela from septic pulmonary emboli-will stop at discharge -Chest x-ray 12/21 revealed loculated pleural fluid possibly loculated pneumothorax-CT of the chest confirmed bronchopleural fistula -Encourage use of incentive spirometry  Persistent tachycardia/tricuspid valve insufficiency -Evaluated by CVTS and currently not a surgical candidate for cardiac valve replacement giving active IVDA prior to current admission (although was appropriate for angiovac surgery) -Echocardiogram: mild systolic LV dysfunction -cardiology felt this was  physiologic response to hypoalbuminemia/malnutrition, deconditioning and anemia. -Cardiology recommended to suppress physiologic tachycardia with beta-blockers nor utilize diuretics prophylactically due to increased risk of harm   Severe protein calorie malnutrition with associated refeeding syndrome -Initial BMI 17 patient briefly required tube feedings which have now been discontinued. -BMI continues to trend upward on regular diet with supplementation as below Ensure feeding supplements Nutrition Problem: Severe Malnutrition Etiology: acute illness (MRSA endocarditis) Signs/Symptoms: energy intake < or equal to 50% for > or equal to 5 days,percent weight loss (8.5% weight loss in less than 2 months) Percent weight loss: 8.5 % Interventions: Ensure Enlive (each supplement provides 350kcal and 20 grams of protein),Magic cup,MVI Estimated body mass index is 17.54 kg/m as calculated from the following:  Height as of this encounter: 5\' 1"  (1.549 m).   Weight as of this encounter: 42.1 kg.  Skin wound secondary to IV infiltration/right groin wound/right neck incision     11/30/19                        12/20/19                         12/30/19  -ID also following along regarding this wound.  No indication to change current management and no indication to consult surgery -Dark eschar fell off on 12/10 as evidenced by  pictures above wound continues to improve -Continue gabapentin 300 mg TID  Wound / Incision (Open or Dehisced) 12/10/19 Non-pressure wound Arm Left;Posterior;Lateral infiltration/phlebitis (Active)  Date First Assessed/Time First Assessed: 12/10/19 1915   Wound Type: Non-pressure wound  Location: Arm  Location Orientation: Left;Posterior;Lateral  Wound Description (Comments): infiltration/phlebitis    Assessments 12/10/2019 11:45 PM 01/11/2020  8:17 AM  Dressing Type Thin film;Non adherent;Gauze (Comment) Abdominal binder;Impregnated gauze (petrolatum)  Dressing Changed New --  Dressing Status Intact Clean;Dry;Intact  Dressing Change Frequency PRN Daily  Site / Wound Assessment Painful;Red;Bleeding Dressing in place / Unable to assess  Closure None --  Drainage Amount Minimal --  Drainage Description Serosanguineous --  Treatment Other (Comment) --     No Linked orders to display    Chronic IV drug abuse with opiates/recent withdrawal syndrome -12/13: Discussed with patient that plan is to decrease to discharge dose of methadone 40 mg daily by next Monday 12/20 -Pam has been decreased to 0.25 mg BID and dose will be decreased to once daily today  GAD -As noted above weaning and decreasing benzodiazepine -Discussed with patient's mother and as I have also noted patient has significant anxiety issues -12/10: Lexapro 10 mg HS initiated and as of 12/24 will increase to 20 mg -Klonopin discontinued today 12/29      Other problems: Anemia of chronic disease/recent sepsis related thrombocytopenia -Thrombocytopenia has resolved -Hemoglobin stable around 8 -With initiation of NSAIDs daily Protonix has been started  Hyponatremia -Current sodium stable at 133  Hep C antibody positive with persistent transaminitis -Currently has nonobstructive transaminitis that has improved with improvement in nutrition -As of 12/3 albumin 2.0, AST 52, ALT normal at 42 and total bilirubin has been  normal -HCVRNA quantitative pending  LBP -Musculoskeletal in etiology noting no significant abnormalities on recent MRI -Continue supportive care with ibuprofen prn and lidocaine patch   Data Reviewed: Basic Metabolic Panel: Recent Labs  Lab 01/09/20 0419  NA 135  K 4.0  CL 100  CO2 27  GLUCOSE 110*  BUN 7  CREATININE 0.49  CALCIUM 8.8*   Liver Function Tests: No results for input(s): AST, ALT, ALKPHOS, BILITOT, PROT, ALBUMIN in the last 168 hours. No results for input(s): LIPASE, AMYLASE in the last 168 hours. No results for input(s): AMMONIA in the last 168 hours. CBC: Recent Labs  Lab 01/09/20 0419  WBC 7.6  HGB 9.2*  HCT 31.8*  MCV 89.1  PLT 289   Cardiac Enzymes: Recent Labs  Lab 01/05/20 0317  CKTOTAL 24*   BNP (last 3 results) Recent Labs    11/30/19 1809  BNP 583.0*    ProBNP (last 3 results) No results for input(s): PROBNP in the last 8760 hours.  CBG: No results for input(s): GLUCAP in the last 168 hours.  Recent Results (from the past 240 hour(s))  Body fluid culture     Status: None   Collection Time: 01/05/20  2:56 PM   Specimen: Pleura  Result Value Ref Range Status   Specimen Description PLEURAL FLUID  Final   Special Requests LEFT CHEST  Final   Gram Stain NO WBC SEEN NO ORGANISMS SEEN   Final   Culture   Final    NO GROWTH Performed at [facility], [address], [place], [place] [address]    Report Status 01/08/2020 FINAL  Final     Studies: DG Chest 1 View  Result Date: 01/10/2020 CLINICAL DATA:  Chest tube removal. EXAM: CHEST  1 VIEW COMPARISON:  Same day. FINDINGS: Stable cardiomediastinal silhouette. Left-sided chest tube has been removed. No pneumothorax is noted. Right-sided PICC line is unchanged. Stable pleural thickening or loculated pleural effusion is noted laterally in left lung base. Stable other bilateral lung opacities are noted concerning for multifocal inflammation. Bony thorax is unremarkable.  IMPRESSION: Stable pleural thickening or loculated pleural effusion laterally in left lung base. Stable other bilateral lung opacities are noted concerning for multifocal inflammation. No pneumothorax is noted status post chest tube removal. Electronically Signed   By: Lupita Raider M.D.   On: 01/10/2020 15:35   CT CHEST WO CONTRAST  Result Date: 01/09/2020 CLINICAL DATA:  History of LEFT hydropneumothorax status post LEFT chest tube placement on 01/05/2020. EXAM: CT CHEST WITHOUT CONTRAST TECHNIQUE: Multidetector CT imaging of the chest was performed following the standard protocol without IV contrast. COMPARISON:  Chest CT dated 01/03/2020. FINDINGS: Cardiovascular: Thoracic aorta is normal in caliber and configuration. No pericardial effusion. Mediastinum/Nodes: No mass or enlarged lymph nodes are seen within the mediastinum. Trachea and central bronchi are unremarkable. Lungs/Pleura: LEFT-sided chest tube in place. Chest tube is well positioned within the previously described complex collection of fluid and air in the LEFT pleural space. This component of the LEFT-sided pleural collection is nearly completely resolved. There remains complex loculated-appearing collections of fluid and air along the posterior and medial aspects of the LEFT lower lobe, measuring 4 cm and 5 cm greatest dimension respectively. The additional previous described multiple patchy/nodular opacities within the bilateral lungs, some cavitary, are not significantly changed in the short-term interval, compatible with atypical pneumonia versus septic emboli. Upper Abdomen: Limited images of the upper abdomen are unremarkable. Musculoskeletal: No acute appearing osseous abnormality IMPRESSION: 1. LEFT-sided chest tube is well positioned within the previously described complex collection of fluid and air in the LEFT pleural space. This component of the LEFT-sided pleural collection is nearly completely resolved. 2. There remains complex  loculated-appearing collections of fluid and air along the posterior and medial aspects of the LEFT lower lobe, largest component at the medial aspect of the LEFT lung base measuring 5 cm greatest dimension. These may be secondary to bronchopleural fistula, as previously suggested. 3. Multiple patchy/nodular consolidations are again seen throughout the bilateral lungs, some cavitary, not significantly changed in the short-term interval, compatible with atypical pneumonia versus septic emboli. Favor septic emboli based on clinical data of drug abuse provided on previous exams. Electronically Signed   By: Bary Richard M.D.   On: 01/09/2020 17:51   DG Chest Port 1 View  Result Date: 01/10/2020 CLINICAL DATA:  29 year old female with prior chest tube EXAM: PORTABLE CHEST 1 VIEW COMPARISON:  CT chest 01/09/2020, plain film 01/09/2020 FINDINGS: Cardiomediastinal silhouette unchanged in size and contour. Similar appearance focal nodular opacities of the lungs as well as pleuroparenchymal thickening along the  left lateral pleura. Unchanged position of left pigtail thoracostomy with a slight kink at the chest wall and trance site. No pneumothorax. Unchanged right upper extremity PICC. IMPRESSION: Unchanged appearance of the chest x-ray with persisting left pigtail thoracostomy tube and residual lateral pleural thickening/fluid. Unchanged right upper extremity PICC, and plain film appearance of known infectious lung nodules. Electronically Signed   By: Gilmer Mor D.O.   On: 01/10/2020 13:41    Scheduled Meds: . (feeding supplement) PROSource Plus  30 mL Oral TID BM  . chlorhexidine  15 mL Mouth Rinse BID  . Chlorhexidine Gluconate Cloth  6 each Topical Daily  . clonazepam  0.25 mg Oral Daily  . collagenase   Topical Daily  . diclofenac Sodium  4 g Topical QID  . doxycycline  100 mg Oral Q12H  . escitalopram  20 mg Oral QHS  . feeding supplement  237 mL Oral TID BM  . gabapentin  300 mg Oral TID  .  lidocaine  2 patch Transdermal Q24H  . mouth rinse  15 mL Mouth Rinse q12n4p  . methadone  20 mg Oral Q12H  . multivitamin with minerals  1 tablet Oral Daily  . pantoprazole  40 mg Oral Daily  . sodium chloride flush  10-40 mL Intracatheter Q12H  . sodium chloride flush  10-40 mL Intracatheter Q12H   Continuous Infusions: . sodium chloride 10 mL/hr at 12/05/19 0617  . sodium chloride Stopped (12/19/19 2156)  . DAPTOmycin (CUBICIN)  IV 500 mg (01/10/20 2039)    Principal Problem:   MRSA infection Active Problems:   Septic shock (HCC)   Opioid use with withdrawal (HCC)   Endocarditis of tricuspid valve   Palliative care encounter   Protein-calorie malnutrition, severe   Consultants:  PCCM  Infectious disease  CVTS  Cardiology  Palliative medicine  Procedures:  10/22 echocardiogram  10/24 TEE  11/17 complete echocardiogram  11/22 intraoperative TEE/application of angio [facility]  11/24 core track  11/29 Limited echocardiogram  Antibiotics: Anti-infectives (From admission, onward)   Start     Dose/Rate Route Frequency Ordered Stop   12/13/19 1115  doxycycline (VIBRA-TABS) tablet 100 mg        100 mg Oral Every 12 hours 12/13/19 1027 01/16/20 2359   12/07/19 2000  DAPTOmycin (CUBICIN) 500 mg in sodium chloride 0.9 % IVPB        500 mg 220 mL/hr over 30 Minutes Intravenous Daily 12/06/19 1345 01/16/20 2359   12/02/19 1400  vancomycin (VANCOREADY) IVPB 500 mg/100 mL  Status:  Discontinued        500 mg 100 mL/hr over 60 Minutes Intravenous Every 8 hours 12/02/19 1127 12/02/19 1131   12/02/19 1400  vancomycin (VANCOREADY) IVPB 750 mg/150 mL  Status:  Discontinued        750 mg 150 mL/hr over 60 Minutes Intravenous Every 8 hours 12/02/19 1131 12/06/19 1339   12/01/19 1200  vancomycin (VANCOREADY) IVPB 500 mg/100 mL  Status:  Discontinued        500 mg 100 mL/hr over 60 Minutes Intravenous Every 24 hours 11/30/19 1454 12/01/19 0946   12/01/19 1200  vancomycin  (VANCOCIN) IVPB 1000 mg/200 mL premix  Status:  Discontinued        1,000 mg 200 mL/hr over 60 Minutes Intravenous Every 24 hours 12/01/19 0946 12/02/19 1127   11/30/19 1800  piperacillin-tazobactam (ZOSYN) IVPB 3.375 g  Status:  Discontinued        3.375 g 12.5 mL/hr over 240 Minutes Intravenous  Every 8 hours 11/30/19 1454 12/02/19 0234   11/30/19 1115  piperacillin-tazobactam (ZOSYN) IVPB 3.375 g        3.375 g 100 mL/hr over 30 Minutes Intravenous  Once 11/30/19 1110 11/30/19 1306   11/30/19 1115  vancomycin (VANCOCIN) IVPB 1000 mg/200 mL premix        1,000 mg 200 mL/hr over 60 Minutes Intravenous  Once 11/30/19 1110 11/30/19 1337       Time spent: 20 minutes    [name] ANP  [facility] 7 am - 330 pm/M-F 42 days

## 2020-01-11 NOTE — Plan of Care (Signed)

## 2020-01-12 LAB — CK: Total CK: 16 U/L — ABNORMAL LOW (ref 38–234)

## 2020-01-12 NOTE — Progress Notes (Signed)
[facility] PROGRESS NOTE ADDENDUM I have seen and examined this patient myself. I have also discussed this patient in detail with Amber Silk, NP. I have reviewed her note. I am in agreement with her evaluation and plan.   Amber [name]:[id number] DOB: Dec 19, 1990 DOA: 11/30/2019 PCP: Patient, No Pcp Per     12/15  Status: Remains inpatient appropriate because:Ongoing active pain requiring inpatient pain management, Ongoing diagnostic testing needed not appropriate for outpatient work up, Unsafe d/c plan, IV treatments appropriate due to intensity of illness or inability to take PO and Inpatient level of care appropriate due to severity of illness   Dispo: The patient is from: Home              Anticipated d/c is to: Home              Anticipated d/c date is: > 3 days (01/16/20)              Patient currently is not medically stable to d/c.  Patient's primary reason for remaining in the hospital is to complete 6 weeks of IV and oral antibiotics for her endocarditis with septic emboli.  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  Code Status: Full Family Communication: Patient; mother 12/28 DVT prophylaxis: SCDs 2/2 recent thrombocytopenia due to sepsis Vaccination status: Has not been vaccinated against Covid  Foley catheter: No  HPI: 29 year old female patient with known IV drug abuse with known tricuspid MRSA endocarditis who signed out AMA x2 prior to this admission.  She presented with progressive infection.  On 10/21 through 10/23 she was admitted with fever with blood cultures positive for MRSA.  CT of the chest demonstrated septic emboli and edematous L4-5 facet joint (? Evolving osteomyelitis) as well as tricuspid vegetation on CT.  She left AMA.  She returned 2 hours later on 10/23 with severe back pain.  Underwent TEE  that demonstrated a 1 cm tricuspid valve mass.  She was evaluated by CT surgery who recommended medical management.  Again she left AMA on 10/25.  Patient was staying with friends but presented back to the hospital due to increasing shortness of breath, weakness, fatigue malaise and nausea.  She arrived in the parking lot obtunded and was found to have multiple needles in her car on 11/17.  In the ER she was hypotensive and started on pressors.  Hemoglobin was low and she was given a blood transfusion.  She was also started on empiric IV vancomycin for known MRSA infection  and was admitted to the critical care service.  She eventually was taken to the OR for angio VAC during this admission.  She has subsequently transitioned out of ICU to stepdown unit.  She has had an IV infiltration and has a superficial wound on her left forearm. ID continues to follow regarding the endocarditis and recommends a total of 6 weeks therapy be daptomycin.  She has continued to have hypoxemia and productive cough and on 11/30 ID added doxycycline.  She is reporting pleuritic chest pain as well.  She is also very malnourished and was started on tube feedings and developed mild electrolyte disturbances from refeeding syndrome which have resolved.  She has low-grade resting tachycardia with no significant abnormalities on echo and cardiology previously did not suspect cardiac etiology to her tachycardia.  Hepatitis C antibody has been positive; she is HIV  negative.  She has had persistent elevation in her LFTs.  She is currently on methadone 20 mg 3 times a day but will need to be on no more than 40 mg per 24 hours at time of discharge to be eligible for outpatient methadone treatment.  Subjective: Up in bed.  States is still having some minor pain left lateral rib cage and did require OxyIR overnight.  Objective: Vitals:   01/11/20 2049 01/12/20 0550  BP: 130/72 91/71  Pulse: 98 90  Resp: 18 18  Temp: 98.3 F (36.8 C)  98.2 F (36.8 C)  SpO2: 98% 98%    Intake/Output Summary (Last 24 hours) at 01/12/2020 1234 Last data filed at 01/12/2020 1112 Gross per 24 hour  Intake 537 ml  Output 3 ml  Net 534 ml   Filed Weights   12/31/19 0647 01/02/20 0500 01/04/20 0624  Weight: 41.4 kg 41.4 kg 42.1 kg    Exam: General: Awake, calm, no acute distress Pulmonary: Bilateral lung sounds clear to auscultation anteriorly, slightly tender over previous left lateral chest tube site.  Room air sats stable at 97% Cardiac: Heart sounds S1-S2 without murmurs including over tricuspid area, no JVD and no peripheral edema.  Pulses regular and nontachycardic at rest Abdomen: Soft nontender nondistended with normoactive bowel sounds auscultated.Marland Kitchen.LBM 12/28 Extremities: Symmetrical, LUE dressing clean dry and intact    Assessment/Plan: Acute problems: Septic shock 2/2 MRSA bacteremia and associated TV endocarditis status post angio VAC/abnormal lumbar imaging -ID commended 42 days of IV daptomycin via PICC inserted 11/30 (last dose should be 01/16/20)-CK stable -ID following  -Repeat echo 11/29 shows resolution of endocarditis but persistent severe tricuspid regurgitation -Repeat lumbar MRI revealed no evidence of infection in lumbar spine or other significant abnormality  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 Lightfoot's assistance.  -Pigtail catheter to close drainage system placed on 12/23 -Chest tube removed on 12/28 with follow-up chest x-ray unremarkable -Short acting Oxy decreased from 15 mg to 10 mg on 12/28.  On 12/30 will decrease to 5 mg and continue for 2 days then discontinue noting patient currently on chronic methadone.  Acute hypoxemic respiratory failure (multifactorial) 1 acute sepsis 2 septic emboli with sequela of small pleural effusion -Sepsis physiology  and hypoxemia resolved -Continue oral doxycycline for sequela from septic pulmonary emboli-will stop at discharge -Chest x-ray 12/21 revealed loculated pleural fluid possibly loculated pneumothorax-CT of the chest confirmed bronchopleural fistula -Encourage use of incentive spirometry  Persistent tachycardia/tricuspid valve insufficiency -Evaluated by CVTS and currently not a surgical candidate for cardiac valve replacement giving active IVDA prior to current admission (although was appropriate for angiovac surgery) -Echocardiogram: mild systolic LV dysfunction -cardiology felt this was  physiologic response to hypoalbuminemia/malnutrition, deconditioning and anemia. -Cardiology recommended to suppress physiologic tachycardia with beta-blockers nor utilize diuretics prophylactically due to increased risk of harm   Severe protein calorie malnutrition with associated refeeding syndrome -Initial BMI 17 patient briefly required tube feedings which have now been discontinued. -BMI continues to trend upward on regular diet with supplementation as below Ensure feeding supplements Nutrition Problem: Severe Malnutrition Etiology: acute illness (MRSA endocarditis) Signs/Symptoms: energy intake < or equal to 50% for > or equal to 5 days,percent weight loss (8.5% weight loss in less than 2 months) Percent weight loss: 8.5 % Interventions: Ensure Enlive (each supplement provides 350kcal and 20 grams of protein),Magic cup,MVI Estimated body mass index is 17.54 kg/m  as calculated from the following:   Height as of this encounter: 5\' 1"  (1.549 m).   Weight as of this encounter: 42.1 kg.  Skin wound secondary to IV infiltration/right groin wound/right neck incision     11/30/19                        12/20/19                         12/30/19  -ID also following along regarding this wound.  No indication to change current management and no indication to consult surgery -Dark eschar fell off on 12/10 as  evidenced by pictures above wound continues to improve -Continue gabapentin 300 mg TID  Wound / Incision (Open or Dehisced) 12/10/19 Non-pressure wound Arm Left;Posterior;Lateral infiltration/phlebitis (Active)  Date First Assessed/Time First Assessed: 12/10/19 1915   Wound Type: Non-pressure wound  Location: Arm  Location Orientation: Left;Posterior;Lateral  Wound Description (Comments): infiltration/phlebitis    Assessments 12/10/2019 11:45 PM 01/11/2020  9:00 PM  Dressing Type Thin film;Non adherent;Gauze (Comment) Abdominal pads;Gauze (Comment)  Dressing Changed New New  Dressing Status Intact Clean;Dry;Intact;Other (Comment)  Dressing Change Frequency PRN Daily  Site / Wound Assessment Painful;Red;Bleeding --  Closure None --  Drainage Amount Minimal --  Drainage Description Serosanguineous --  Treatment Other (Comment) --     No Linked orders to display    Chronic IV drug abuse with opiates/recent withdrawal syndrome -12/13: Discussed with patient that plan is to decrease to discharge dose of methadone 40 mg daily by next Monday 12/20 -Pam has been decreased to 0.25 mg BID and dose will be decreased to once daily today  GAD -As noted above weaning and decreasing benzodiazepine -Discussed with patient's mother and as I have also noted patient has significant anxiety issues -12/10: Lexapro 10 mg HS initiated and as of 12/24 will increase to 20 mg -Klonopin discontinued today 12/29      Other problems: Anemia of chronic disease/recent sepsis related thrombocytopenia -Thrombocytopenia has resolved -Hemoglobin stable around 8 -With initiation of NSAIDs daily Protonix has been started  Hyponatremia -Current sodium stable at 133  Hep C antibody positive with persistent transaminitis -Currently has nonobstructive transaminitis that has improved with improvement in nutrition -As of 12/3 albumin 2.0, AST 52, ALT normal at 42 and total bilirubin has been normal -HCVRNA  quantitative pending  LBP -Musculoskeletal in etiology noting no significant abnormalities on recent MRI -Continue supportive care with ibuprofen prn and lidocaine patch   Data Reviewed: Basic Metabolic Panel: Recent Labs  Lab 01/09/20 0419  NA 135  K 4.0  CL 100  CO2 27  GLUCOSE 110*  BUN 7  CREATININE 0.49  CALCIUM 8.8*   Liver Function Tests: No results for input(s): AST, ALT, ALKPHOS, BILITOT, PROT, ALBUMIN in the last 168 hours. No results for input(s): LIPASE, AMYLASE in the last 168 hours. No results for input(s): AMMONIA in the last 168 hours. CBC: Recent Labs  Lab 01/09/20 0419  WBC 7.6  HGB 9.2*  HCT 31.8*  MCV 89.1  PLT 289   Cardiac Enzymes: Recent Labs  Lab 01/11/20 1126 01/12/20 0346  CKTOTAL 23* 16*   BNP (last 3 results) Recent Labs    11/30/19 1809  BNP 583.0*    ProBNP (last 3 results) No results for input(s): PROBNP in the last 8760 hours.  CBG: No results for input(s): GLUCAP in the last 168 hours.  Recent Results (from  the past 240 hour(s))  Body fluid culture     Status: None   Collection Time: 01/05/20  2:56 PM   Specimen: Pleura  Result Value Ref Range Status   Specimen Description PLEURAL FLUID  Final   Special Requests LEFT CHEST  Final   Gram Stain NO WBC SEEN NO ORGANISMS SEEN   Final   Culture   Final    NO GROWTH Performed at [facility], [address] [place], [place] [address]    Report Status 01/08/2020 FINAL  Final     Studies: DG Chest 1 View  Result Date: 01/10/2020 CLINICAL DATA:  Chest tube removal. EXAM: CHEST  1 VIEW COMPARISON:  Same day. FINDINGS: Stable cardiomediastinal silhouette. Left-sided chest tube has been removed. No pneumothorax is noted. Right-sided PICC line is unchanged. Stable pleural thickening or loculated pleural effusion is noted laterally in left lung base. Stable other bilateral lung opacities are noted concerning for multifocal inflammation. Bony thorax is unremarkable.  IMPRESSION: Stable pleural thickening or loculated pleural effusion laterally in left lung base. Stable other bilateral lung opacities are noted concerning for multifocal inflammation. No pneumothorax is noted status post chest tube removal. Electronically Signed   By: Lupita Raider M.D.   On: 01/10/2020 15:35   DG Chest Port 1 View  Result Date: 01/10/2020 CLINICAL DATA:  29 year old female with prior chest tube EXAM: PORTABLE CHEST 1 VIEW COMPARISON:  CT chest 01/09/2020, plain film 01/09/2020 FINDINGS: Cardiomediastinal silhouette unchanged in size and contour. Similar appearance focal nodular opacities of the lungs as well as pleuroparenchymal thickening along the left lateral pleura. Unchanged position of left pigtail thoracostomy with a slight kink at the chest wall and trance site. No pneumothorax. Unchanged right upper extremity PICC. IMPRESSION: Unchanged appearance of the chest x-ray with persisting left pigtail thoracostomy tube and residual lateral pleural thickening/fluid. Unchanged right upper extremity PICC, and plain film appearance of known infectious lung nodules. Electronically Signed   By: Gilmer Mor D.O.   On: 01/10/2020 13:41    Scheduled Meds: . (feeding supplement) PROSource Plus  30 mL Oral TID BM  . chlorhexidine  15 mL Mouth Rinse BID  . Chlorhexidine Gluconate Cloth  6 each Topical Daily  . collagenase   Topical Daily  . diclofenac Sodium  4 g Topical QID  . doxycycline  100 mg Oral Q12H  . escitalopram  20 mg Oral QHS  . feeding supplement  237 mL Oral TID BM  . gabapentin  300 mg Oral TID  . lidocaine  2 patch Transdermal Q24H  . mouth rinse  15 mL Mouth Rinse q12n4p  . methadone  20 mg Oral Q12H  . multivitamin with minerals  1 tablet Oral Daily  . pantoprazole  40 mg Oral Daily  . sodium chloride flush  10-40 mL Intracatheter Q12H  . sodium chloride flush  10-40 mL Intracatheter Q12H   Continuous Infusions: . sodium chloride 10 mL/hr at 12/05/19 0617   . sodium chloride Stopped (12/19/19 2156)  . DAPTOmycin (CUBICIN)  IV 500 mg (01/11/20 2003)    Principal Problem:   MRSA infection Active Problems:   Septic shock (HCC)   Opioid use with withdrawal (HCC)   Endocarditis of tricuspid valve   Palliative care encounter   Protein-calorie malnutrition, severe   Consultants:  PCCM  Infectious disease  CVTS  Cardiology  Palliative medicine  Procedures:  10/22 echocardiogram  10/24 TEE  11/17 complete echocardiogram  11/22 intraoperative TEE/application of angio VAC  11/24 core track  11/29 Limited echocardiogram  Antibiotics: Anti-infectives (From admission, onward)   Start     Dose/Rate Route Frequency Ordered Stop   12/13/19 1115  doxycycline (VIBRA-TABS) tablet 100 mg        100 mg Oral Every 12 hours 12/13/19 1027 01/16/20 2359   12/07/19 2000  DAPTOmycin (CUBICIN) 500 mg in sodium chloride 0.9 % IVPB        500 mg 220 mL/hr over 30 Minutes Intravenous Daily 12/06/19 1345 01/16/20 2359   12/02/19 1400  vancomycin (VANCOREADY) IVPB 500 mg/100 mL  Status:  Discontinued        500 mg 100 mL/hr over 60 Minutes Intravenous Every 8 hours 12/02/19 1127 12/02/19 1131   12/02/19 1400  vancomycin (VANCOREADY) IVPB 750 mg/150 mL  Status:  Discontinued        750 mg 150 mL/hr over 60 Minutes Intravenous Every 8 hours 12/02/19 1131 12/06/19 1339   12/01/19 1200  vancomycin (VANCOREADY) IVPB 500 mg/100 mL  Status:  Discontinued        500 mg 100 mL/hr over 60 Minutes Intravenous Every 24 hours 11/30/19 1454 12/01/19 0946   12/01/19 1200  vancomycin (VANCOCIN) IVPB 1000 mg/200 mL premix  Status:  Discontinued        1,000 mg 200 mL/hr over 60 Minutes Intravenous Every 24 hours 12/01/19 0946 12/02/19 1127   11/30/19 1800  piperacillin-tazobactam (ZOSYN) IVPB 3.375 g  Status:  Discontinued        3.375 g 12.5 mL/hr over 240 Minutes Intravenous Every 8 hours 11/30/19 1454 12/02/19 0234   11/30/19 1115   piperacillin-tazobactam (ZOSYN) IVPB 3.375 g        3.375 g 100 mL/hr over 30 Minutes Intravenous  Once 11/30/19 1110 11/30/19 1306   11/30/19 1115  vancomycin (VANCOCIN) IVPB 1000 mg/200 mL premix        1,000 mg 200 mL/hr over 60 Minutes Intravenous  Once 11/30/19 1110 11/30/19 1337       Time spent: 20 minutes    Amber [name] ANP  [facility] 7 am - 330 pm/M-F 43 days

## 2020-01-12 NOTE — Plan of Care (Signed)

## 2020-01-13 MED ORDER — OXYCODONE HCL 5 MG PO TABS
5.0000 mg | ORAL_TABLET | ORAL | Status: DC | PRN
  Administered 2020-01-13 – 2020-01-15 (×2): 5 mg via ORAL
  Filled 2020-01-13 (×3): qty 1

## 2020-01-13 NOTE — Progress Notes (Addendum)
[facility] PROGRESS NOTE  ADDENDUM I have seen and examined this patient myself. I have also discussed this patient in detail with [name], NP. I have reviewed her note. I am in agreement with her evaluation and plan.  Amber [name] VHQ:[id number] DOB: [birth year] DOA: 11/30/2019 PCP: Patient, No Pcp Per     12/15  Status: Remains inpatient appropriate because:Ongoing active pain requiring inpatient pain management, Ongoing diagnostic testing needed not appropriate for outpatient work up, Unsafe d/c plan, IV treatments appropriate due to intensity of illness or inability to take PO and Inpatient level of care appropriate due to severity of illness   Dispo: The patient is from: Home              Anticipated d/c is to: Home              Anticipated d/c date is: > 3 days (01/16/20)              Patient currently is not medically stable to d/c.  Patient's primary reason for remaining in the hospital is to complete 6 weeks of IV and oral antibiotics for her endocarditis with septic emboli.  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  Discharge medications have been sent to the [facility] pharmacy including methadone.  Code Status: Full Family Communication: Patient; mother 12/28 DVT prophylaxis: SCDs 2/2 recent thrombocytopenia due to sepsis Vaccination status: Has not been vaccinated against Covid  Foley catheter: No  HPI: 29 year old female patient with known IV drug abuse with known tricuspid MRSA endocarditis who signed out AMA x2 prior to this admission.  She presented with progressive infection.  On 10/21 through 10/23 she was admitted with fever with blood cultures positive for MRSA.  CT of the chest demonstrated septic emboli and edematous L4-5 facet joint (? Evolving osteomyelitis) as well as tricuspid vegetation on CT.  She left  AMA.  She returned 2 hours later on 10/23 with severe back pain.  Underwent TEE that demonstrated a 1 cm tricuspid valve mass.  She was evaluated by CT surgery who recommended medical management.  Again she left AMA on 10/25.  Patient was staying with friends but presented back to the hospital due to increasing shortness of breath, weakness, fatigue malaise and nausea.  She arrived in the parking lot obtunded and was found to have multiple needles in her car on 11/17.  In the ER she was hypotensive and started on pressors.  Hemoglobin was low and she was given a blood transfusion.  She was also started on empiric IV vancomycin for known MRSA infection  and was admitted to the critical care service.  She eventually was taken to the OR for angio VAC during this admission.  She has subsequently transitioned out of ICU to stepdown unit.  She has had an IV infiltration and has a superficial wound on her left forearm. ID continues to follow regarding the endocarditis and recommends a total of 6 weeks therapy be daptomycin.  She has continued to have hypoxemia and productive cough and on 11/30 ID added doxycycline.  She is reporting pleuritic chest pain as well.  She is also very malnourished and was started on tube feedings and developed mild electrolyte disturbances from refeeding syndrome which have resolved.  She has low-grade resting tachycardia with no significant abnormalities on echo and cardiology previously did not suspect cardiac etiology to  her tachycardia.  Hepatitis C antibody has been positive; she is HIV negative.  She has had persistent elevation in her LFTs.  She is currently on methadone 20 mg 3 times a day but will need to be on no more than 40 mg per 24 hours at time of discharge to be eligible for outpatient methadone treatment.  Subjective: States LUE in left lateral rib cage pain markedly improved.  No specific complaints or requests.  Objective: Vitals:   01/12/20 2042 01/13/20 0444  BP:  105/64 106/61  Pulse: 93 87  Resp: 17 17  Temp: 98.2 F (36.8 C) 98.1 F (36.7 C)  SpO2: 99% 98%    Intake/Output Summary (Last 24 hours) at 01/13/2020 1354 Last data filed at 01/13/2020 1014 Gross per 24 hour  Intake 480 ml  Output 450 ml  Net 30 ml   Filed Weights   12/31/19 0647 01/02/20 0500 01/04/20 0624  Weight: 41.4 kg 41.4 kg 42.1 kg    Exam: General: Alert, in no acute distress, calm watching TV Pulmonary: Room air saturations stable between 96 to 98%.  Bilateral lung sounds are clear to auscultation. Cardiac: Heart sounds S1-S2 without murmurs including over tricuspid area, no peripheral edema.  Pulses regular and nontachycardic at rest Abdomen: Soft nontender nondistended with normoactive bowel sounds auscultated.Marland KitchenLBM 12/30 Extremities: Symmetrical, LUE dressing clean dry and intact    Assessment/Plan: Acute problems: Septic shock 2/2 MRSA bacteremia and associated TV endocarditis status post angio VAC/abnormal lumbar imaging -ID commended 42 days of IV daptomycin via PICC inserted 11/30 (last dose should be 01/16/20)-CK stable -ID following  -Repeat echo 11/29 shows resolution of endocarditis but persistent severe tricuspid regurgitation -Repeat lumbar MRI revealed no evidence of infection in lumbar spine or other significant abnormality  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 Lightfoot's assistance.  -Pigtail catheter to close drainage system placed on 12/23 -Chest tube removed on 12/28 with follow-up chest x-ray unremarkable -Short acting Oxy decreased from 15 mg to 10 mg on 12/28.  On 12/30 will decrease to 5 mg and continue for 2 days then discontinue noting patient currently on chronic methadone.  Acute hypoxemic respiratory failure (multifactorial) 1 acute sepsis 2 septic emboli with sequela of small pleural  effusion -Sepsis physiology and hypoxemia resolved -Continue oral doxycycline for sequela from septic pulmonary emboli-will stop at discharge -Chest x-ray 12/21 revealed loculated pleural fluid possibly loculated pneumothorax-CT of the chest confirmed bronchopleural fistula -Encourage use of incentive spirometry  Persistent tachycardia/tricuspid valve insufficiency -Evaluated by CVTS and currently not a surgical candidate for cardiac valve replacement giving active IVDA prior to current admission (although was appropriate for angiovac surgery) -Echocardiogram: mild systolic LV dysfunction -cardiology felt this was  physiologic response to hypoalbuminemia/malnutrition, deconditioning and anemia. -Cardiology recommended to suppress physiologic tachycardia with beta-blockers nor utilize diuretics prophylactically due to increased risk of harm   Severe protein calorie malnutrition with associated refeeding syndrome -Initial BMI 17 patient briefly required tube feedings which have now been discontinued. -BMI continues to trend upward on regular diet with supplementation as below Ensure feeding supplements Nutrition Problem: Severe Malnutrition Etiology: acute illness (MRSA endocarditis) Signs/Symptoms: energy intake < or equal to 50% for > or equal to 5 days,percent weight loss (8.5% weight loss in less than 2 months) Percent weight loss: 8.5 % Interventions: Ensure Enlive (each supplement provides 350kcal and 20 grams of protein),Magic cup,MVI Estimated body mass index is 17.54  kg/m as calculated from the following:   Height as of this encounter: 5\' 1"  (1.549 m).   Weight as of this encounter: 42.1 kg.  Skin wound secondary to IV infiltration/right groin wound/right neck incision     11/30/19                        12/20/19                         12/30/19  -ID also following along regarding this wound.  No indication to change current management and no indication to consult surgery -Dark  eschar fell off on 12/10 as evidenced by pictures above wound continues to improve -Continue gabapentin 300 mg TID  Wound / Incision (Open or Dehisced) 12/10/19 Non-pressure wound Arm Left;Posterior;Lateral infiltration/phlebitis (Active)  Date First Assessed/Time First Assessed: 12/10/19 1915   Wound Type: Non-pressure wound  Location: Arm  Location Orientation: Left;Posterior;Lateral  Wound Description (Comments): infiltration/phlebitis    Assessments 12/10/2019 11:45 PM 01/12/2020 10:00 PM  Dressing Type Thin film;Non adherent;Gauze (Comment) --  Dressing Changed New Changed  Dressing Status Intact --  Dressing Change Frequency PRN --  Site / Wound Assessment Painful;Red;Bleeding --  Closure None --  Drainage Amount Minimal --  Drainage Description Serosanguineous --  Treatment Other (Comment) --     No Linked orders to display    Chronic IV drug abuse with opiates/recent withdrawal syndrome -12/13: Discussed with patient that plan is to decrease to discharge dose of methadone 40 mg daily by next Monday 12/20 -Pam has been decreased to 0.25 mg BID and dose will be decreased to once daily today  GAD -As noted above weaning and decreasing benzodiazepine -Discussed with patient's mother and as I have also noted patient has significant anxiety issues -12/10: Lexapro 10 mg HS initiated and as of 12/24 will increase to 20 mg -Klonopin discontinued today 12/29      Other problems: Anemia of chronic disease/recent sepsis related thrombocytopenia -Thrombocytopenia has resolved -Hemoglobin stable around 8 -With initiation of NSAIDs daily Protonix has been started  Hyponatremia -Current sodium stable at 133  Hep C antibody positive with persistent transaminitis -Currently has nonobstructive transaminitis that has improved with improvement in nutrition -As of 12/3 albumin 2.0, AST 52, ALT normal at 42 and total bilirubin has been normal -HCVRNA quantitative  pending  LBP -Musculoskeletal in etiology noting no significant abnormalities on recent MRI -Continue supportive care with ibuprofen prn and lidocaine patch   Data Reviewed: Basic Metabolic Panel: Recent Labs  Lab 01/09/20 0419  NA 135  K 4.0  CL 100  CO2 27  GLUCOSE 110*  BUN 7  CREATININE 0.49  CALCIUM 8.8*   Liver Function Tests: No results for input(s): AST, ALT, ALKPHOS, BILITOT, PROT, ALBUMIN in the last 168 hours. No results for input(s): LIPASE, AMYLASE in the last 168 hours. No results for input(s): AMMONIA in the last 168 hours. CBC: Recent Labs  Lab 01/09/20 0419  WBC 7.6  HGB 9.2*  HCT 31.8*  MCV 89.1  PLT 289   Cardiac Enzymes: Recent Labs  Lab 01/11/20 1126 01/12/20 0346  CKTOTAL 23* 16*   BNP (last 3 results) Recent Labs    11/30/19 1809  BNP 583.0*    ProBNP (last 3 results) No results for input(s): PROBNP in the last 8760 hours.  CBG: No results for input(s): GLUCAP in the last 168 hours.  Recent Results (from the past 240  hour(s))  Body fluid culture     Status: None   Collection Time: 01/05/20  2:56 PM   Specimen: Pleura  Result Value Ref Range Status   Specimen Description PLEURAL FLUID  Final   Special Requests LEFT CHEST  Final   Gram Stain NO WBC SEEN NO ORGANISMS SEEN   Final   Culture   Final    NO GROWTH Performed at [facility], [address] [place], [place] [address]    Report Status 01/08/2020 FINAL  Final     Studies: No results found.  Scheduled Meds: . chlorhexidine  15 mL Mouth Rinse BID  . Chlorhexidine Gluconate Cloth  6 each Topical Daily  . collagenase   Topical Daily  . diclofenac Sodium  4 g Topical QID  . doxycycline  100 mg Oral Q12H  . escitalopram  20 mg Oral QHS  . feeding supplement  237 mL Oral TID BM  . gabapentin  300 mg Oral TID  . lidocaine  2 patch Transdermal Q24H  . mouth rinse  15 mL Mouth Rinse q12n4p  . methadone  20 mg Oral Q12H  . multivitamin with minerals  1  tablet Oral Daily  . pantoprazole  40 mg Oral Daily  . sodium chloride flush  10-40 mL Intracatheter Q12H  . sodium chloride flush  10-40 mL Intracatheter Q12H   Continuous Infusions: . sodium chloride 10 mL/hr at 12/05/19 0617  . sodium chloride Stopped (12/19/19 2156)  . DAPTOmycin (CUBICIN)  IV 500 mg (01/12/20 2056)    Principal Problem:   MRSA infection Active Problems:   Septic shock (HCC)   Opioid use with withdrawal (HCC)   Endocarditis of tricuspid valve   Palliative care encounter   Protein-calorie malnutrition, severe   Consultants:  PCCM  Infectious disease  CVTS  Cardiology  Palliative medicine  Procedures:  10/22 echocardiogram  10/24 TEE  11/17 complete echocardiogram  11/22 intraoperative TEE/application of angio [facility]  11/24 core track  11/29 Limited echocardiogram  Antibiotics: Anti-infectives (From admission, onward)   Start     Dose/Rate Route Frequency Ordered Stop   12/13/19 1115  doxycycline (VIBRA-TABS) tablet 100 mg        100 mg Oral Every 12 hours 12/13/19 1027 01/16/20 2359   12/07/19 2000  DAPTOmycin (CUBICIN) 500 mg in sodium chloride 0.9 % IVPB        500 mg 220 mL/hr over 30 Minutes Intravenous Daily 12/06/19 1345 01/16/20 2359   12/02/19 1400  vancomycin (VANCOREADY) IVPB 500 mg/100 mL  Status:  Discontinued        500 mg 100 mL/hr over 60 Minutes Intravenous Every 8 hours 12/02/19 1127 12/02/19 1131   12/02/19 1400  vancomycin (VANCOREADY) IVPB 750 mg/150 mL  Status:  Discontinued        750 mg 150 mL/hr over 60 Minutes Intravenous Every 8 hours 12/02/19 1131 12/06/19 1339   12/01/19 1200  vancomycin (VANCOREADY) IVPB 500 mg/100 mL  Status:  Discontinued        500 mg 100 mL/hr over 60 Minutes Intravenous Every 24 hours 11/30/19 1454 12/01/19 0946   12/01/19 1200  vancomycin (VANCOCIN) IVPB 1000 mg/200 mL premix  Status:  Discontinued        1,000 mg 200 mL/hr over 60 Minutes Intravenous Every 24 hours 12/01/19 0946  12/02/19 1127   11/30/19 1800  piperacillin-tazobactam (ZOSYN) IVPB 3.375 g  Status:  Discontinued        3.375 g 12.5 mL/hr  over 240 Minutes Intravenous Every 8 hours 11/30/19 1454 12/02/19 0234   11/30/19 1115  piperacillin-tazobactam (ZOSYN) IVPB 3.375 g        3.375 g 100 mL/hr over 30 Minutes Intravenous  Once 11/30/19 1110 11/30/19 1306   11/30/19 1115  vancomycin (VANCOCIN) IVPB 1000 mg/200 mL premix        1,000 mg 200 mL/hr over 60 Minutes Intravenous  Once 11/30/19 1110 11/30/19 1337       Time spent: 20 minutes    [name] ANP  [facility] 7 am - 330 pm/M-F 44 days

## 2020-01-13 NOTE — Progress Notes (Signed)
Nutrition Follow-up  DOCUMENTATION CODES:   Severe malnutrition in context of acute illness/injury,Underweight  INTERVENTION:   -ContinueEnsure Enlive poTID, each supplement provides 350 kcal and 20 grams of protein -ContinueMagic cup TID with meals, each supplement provides 290 kcal and 9 grams of protein -Continue MVI with minerals daily -D/c 30 ml Prosource Plus TID, each supplement provides 100 kcals and 15 grams protein  NUTRITION DIAGNOSIS:   Severe Malnutrition related to acute illness (MRSA endocarditis) as evidenced by energy intake < or equal to 50% for > or equal to 5 days,percent weight loss (8.5% weight loss in less than 2 months).  Ongoing  GOAL:   Patient will meet greater than or equal to 90% of their needs  Progressing   MONITOR:   PO intake,Supplement acceptance,Labs,Weight trends,Skin  REASON FOR ASSESSMENT:   Consult Enteral/tube feeding initiation and management (nocturnal TF)  ASSESSMENT:   29 yo female admitted with septic shock, AKI. PMH includes tricuspid MRSA endocarditis, IVDU.  12/28- chest tube removed  Reviewed I/O's: +237 ml x 24 hours and -4.5 L since 12/30/19  UOP: 450 ml x 24 hours  Oral intake has improved. Noted meal completion 50-100%. Pt has been refusing Prosource supplements, however, taking Ensure.   Per MD notes, plan to d/c on 01/16/19, once IV antibiotics are completed.   Labs reviewed.   Diet Order:   Diet Order            Diet regular Room service appropriate? Yes; Fluid consistency: Thin  Diet effective now                 EDUCATION NEEDS:   Education needs have been addressed  Skin:  Skin Assessment: Skin Integrity Issues: Skin Integrity Issues:: Incisions,Other (Comment) Incisions: right neck, right groin Other: infiltration/phlebitis to left arm  Last BM:  01/12/20  Height:   Ht Readings from Last 1 Encounters:  12/29/19 5\' 1"  (1.549 m)    Weight:   Wt Readings from Last 1 Encounters:   01/04/20 42.1 kg    Ideal Body Weight:  50 kg  BMI:  Body mass index is 17.54 kg/m.  Estimated Nutritional Needs:   Kcal:  1800-2000  Protein:  80-95 gm  Fluid:  >/= 1.5 L    01/06/20, RD, LDN, CDCES Registered Dietitian II Certified Diabetes Care and Education Specialist Please refer to [facility] for RD and/or RD on-call/weekend/after hours pager

## 2020-01-13 NOTE — Plan of Care (Signed)
Patient alert and cooperative this shift. Mother at bedside at this time. Patient ambulating in the room and eating at regular intervals and taking nutritional drinks as ordered.    Problem: Education: Goal: Knowledge of General Education information will improve Description: Including pain rating scale, medication(s)/side effects and non-pharmacologic comfort measures Outcome: Progressing   Problem: Health Behavior/Discharge Planning: Goal: Ability to manage health-related needs will improve Outcome: Progressing   Problem: Clinical Measurements: Goal: Ability to maintain clinical measurements within normal limits will improve Outcome: Progressing Goal: Will remain free from infection Outcome: Progressing Goal: Diagnostic test results will improve Outcome: Progressing Goal: Respiratory complications will improve Outcome: Progressing Goal: Cardiovascular complication will be avoided Outcome: Progressing   Problem: Activity: Goal: Risk for activity intolerance will decrease Outcome: Progressing   Problem: Nutrition: Goal: Adequate nutrition will be maintained Outcome: Progressing   Problem: Coping: Goal: Level of anxiety will decrease Outcome: Progressing   Problem: Elimination: Goal: Will not experience complications related to bowel motility Outcome: Progressing Goal: Will not experience complications related to urinary retention Outcome: Progressing   Problem: Pain Managment: Goal: General experience of comfort will improve Outcome: Progressing   Problem: Safety: Goal: Ability to remain free from injury will improve Outcome: Progressing   Problem: Skin Integrity: Goal: Risk for impaired skin integrity will decrease Outcome: Progressing   Problem: Education: Goal: Knowledge of disease or condition will improve Outcome: Progressing Goal: Understanding of discharge needs will improve Outcome: Progressing   Problem: Health Behavior/Discharge Planning: Goal:  Ability to identify changes in lifestyle to reduce recurrence of condition will improve Outcome: Progressing Goal: Identification of resources available to assist in meeting health care needs will improve Outcome: Progressing   Problem: Physical Regulation: Goal: Complications related to the disease process, condition or treatment will be avoided or minimized Outcome: Progressing   Problem: Safety: Goal: Ability to remain free from injury will improve Outcome: Progressing

## 2020-01-13 NOTE — Progress Notes (Signed)
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 PMhx: admission 10/23-10/26 for endocarditis left AMA, IVDU, cellulitis, depression    PT Comments    Pt able to progress distance ambulated to >/= 225 ft with a RW and only supervision for safety as she ambulates at a slow pace with decreased weight shift and stance time on the L due to pain. She initially described the pain as being in the "hip", but upon her pointing the pain as it was reproduced she pointed directly to her SIJ. The pain occurs during stance phase of gait only. The pain was reproduced with SIJ provocative tests, including the compression test and thigh thrust. Pain is also reproduced with Figure 4 and excessive hip flexion. She also had a positive standing hip flexion test on the L side and the L innominate appeared to move incorrectly with AROM hip flexion in standing compared to the R. Possible L innominate anteriorly rotated with resulting SIJ pain? DO notified. Pt educated on heat/cold application with stetching after heat and also educated on HEP of glut activation on L with muscle energy technique. Will continue to follow acutely. Current recommendations remain appropriate. Pt and pt's mother able to recall directions for proper stair negotiation leading "up with the good and down with the bad" along with proper guarding positioning in prep for d/c home.   Follow Up Recommendations  Outpatient PT     Equipment Recommendations  Rolling walker with 5" wheels    Recommendations for Other Services       Precautions / Restrictions Precautions Precautions: Fall Precaution Comments: contact Restrictions Weight Bearing Restrictions: No    Mobility  Bed Mobility Overal bed mobility: Modified Independent Bed  Mobility: Rolling;Supine to Sit;Sit to Supine Rolling: Modified independent (Device/Increase time)   Supine to sit: Modified independent (Device/Increase time) Sit to supine: Modified independent (Device/Increase time)   General bed mobility comments: Increased time and use of bed controls and bed rails for all mobility due to pain.  Transfers Overall transfer level: Modified independent Equipment used: Rolling walker (2 wheeled) Transfers: Sit to/from Stand Sit to Stand: Modified independent (Device/Increase time)         General transfer comment: Sit to stand with RW safely in appropriate time frame. Cues to reach back for bed to return to sit rather than hold onto RW though.  Ambulation/Gait Ambulation/Gait assistance: Supervision Gait Distance (Feet): 225 Feet Assistive device: Rolling walker (2 wheeled) Gait Pattern/deviations: Step-through pattern;Decreased stride length;Decreased stance time - left;Decreased weight shift to left;Trunk flexed Gait velocity: slowed Gait velocity interpretation: <1.31 ft/sec, indicative of household ambulator General Gait Details: supervision for safety but no LOB. Decreased L weight shift and stance time due to pain, placing weight through arms on RW to alleviate it. Pt with slightly flexed trunk also due to pain and pushing up on RW.   Stairs             Wheelchair Mobility    Modified Rankin (Stroke Patients Only)       Balance Overall balance assessment: Modified Independent Sitting-balance support: Feet supported Sitting balance-[name]: Good     Standing balance support: Bilateral upper extremity supported;During functional activity Standing balance-[name]: Good Standing balance comment: no lob with RW. Painful L LE  Cognition Arousal/Alertness: Awake/alert Behavior During Therapy: WFL for tasks assessed/performed Overall Cognitive Status: Within Functional Limits for  tasks assessed                                 General Comments: Pt aware of deficits and safety concerns.      Exercises      General Comments General comments (skin integrity, edema, etc.): She has shooting pain in her L "hip" (but then pointed to SIJ location) when standing on the leg and the pain was reproduced with SIJ provocative tests, including the compression test and thigh thrust. Pain is also reproduced with Figure 4 and excessive hip flexion. She also had a positive standing hip flexion test on the L side and the L innominate appeared to move incorrectly with AROM hip flexion in standing compared to the R. Possible L innominate anteriorly rotated with resulting SIJ pain? DO notified. Pt educated on heat/cold application with stetching after heat and also educated on HEP of glut activation on L with muscle energy technique.      Pertinent Vitals/Pain Pain Assessment: Faces Faces Pain Scale: Hurts even more Pain Location: L hip at SIJ location Pain Descriptors / Indicators: Discomfort;Guarding;Shooting;Moaning;Grimacing Pain Intervention(s): Limited activity within patient's tolerance;Monitored during session;Repositioned (notified DO of findings)    Home Living                      Prior Function            PT Goals (current goals can now be found in the care plan section) Acute Rehab PT Goals Patient Stated Goal: to go home PT Goal Formulation: With patient Time For Goal Achievement: 01/20/20 Potential to Achieve Goals: Good Progress towards PT goals: Progressing toward goals    Frequency    Min 3X/week      PT Plan Current plan remains appropriate    Co-evaluation              AM-PAC PT "6 Clicks" Mobility   Outcome Measure  Help needed turning from your back to your side while in a flat bed without using bedrails?: None Help needed moving from lying on your back to sitting on the side of a flat bed without using bedrails?:  None Help needed moving to and from a bed to a chair (including a wheelchair)?: None Help needed standing up from a chair using your arms (e.g., wheelchair or bedside chair)?: None Help needed to walk in hospital room?: A Little Help needed climbing 3-5 steps with a railing? : A Little 6 Click Score: 22    End of Session   Activity Tolerance: Patient tolerated treatment well Patient left: in bed;with call bell/phone within reach;with family/visitor present   PT Visit Diagnosis: Difficulty in walking, not elsewhere classified (R26.2);Pain;Unsteadiness on feet (R26.81);Other abnormalities of gait and mobility (R26.89) Pain - Right/Left: Left Pain - part of body: Hip (SIJ location)     Time: 9924-2683 PT Time Calculation (min) (ACUTE ONLY): 27 min  Charges:  $Gait Training: 8-22 mins $Therapeutic Activity: 8-22 mins                     Raymond Gurney, PT, DPT Acute Rehabilitation Services  Pager: [phone] Office: [phone]    Jewel Baize 01/13/2020, 5:34 PM

## 2020-01-14 LAB — MRSA PCR SCREENING: MRSA by PCR: NEGATIVE

## 2020-01-14 MED ORDER — COVID-19 MRNA VACCINE (PFIZER) 30 MCG/0.3ML IM SUSP
0.3000 mL | Freq: Once | INTRAMUSCULAR | Status: AC
  Filled 2020-01-14 (×2): qty 0.3

## 2020-01-14 NOTE — Progress Notes (Signed)
[facility] PROGRESS NOTE    GAE BIHL KGM:[id number] DOB: 1990-06-14 DOA: 11/30/2019 PCP: Patient, No Pcp Per     12/15  Status: Remains inpatient appropriate because:Ongoing active pain requiring inpatient pain management, Ongoing diagnostic testing needed not appropriate for outpatient work up, Unsafe d/c plan, IV treatments appropriate due to intensity of illness or inability to take PO and Inpatient level of care appropriate due to severity of illness   Dispo: The patient is from: Home              Anticipated d/c is to: Home              Anticipated d/c date is: > 3 days (01/16/20)              Patient currently is not medically stable to d/c.  Patient's primary reason for remaining in the hospital is to complete 6 weeks of IV and oral antibiotics for her endocarditis with septic emboli.  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  Discharge medications have been sent to the [facility] pharmacy including methadone.  Code Status: Full Family Communication: Patient; mother 12/28 DVT prophylaxis: SCDs 2/2 recent thrombocytopenia due to sepsis Vaccination status: Has not been vaccinated against Covid  Foley catheter: No  HPI: 30 year old female patient with known IV drug abuse with known tricuspid MRSA endocarditis who signed out AMA x2 prior to this admission.  She presented with progressive infection.  On 10/21 through 10/23 she was admitted with fever with blood cultures positive for MRSA.  CT of the chest demonstrated septic emboli and edematous L4-5 facet joint (? Evolving osteomyelitis) as well as tricuspid vegetation on CT.  She left AMA.  She returned 2 hours later on 10/23 with severe back pain.  Underwent TEE that demonstrated a 1 cm tricuspid valve mass.  She was evaluated by CT surgery who recommended medical management.   Again she left AMA on 10/25.  Patient was staying with friends but presented back to the hospital due to increasing shortness of breath, weakness, fatigue malaise and nausea.  She arrived in the parking lot obtunded and was found to have multiple needles in her car on 11/17.  In the ER she was hypotensive and started on pressors.  Hemoglobin was low and she was given a blood transfusion.  She was also started on empiric IV vancomycin for known MRSA infection  and was admitted to the critical care service.  She eventually was taken to the OR for angio VAC during this admission.  She has subsequently transitioned out of ICU to stepdown unit.  She has had an IV infiltration and has a superficial wound on her left forearm. ID continues to follow regarding the endocarditis and recommends a total of 6 weeks therapy be daptomycin.  She has continued to have hypoxemia and productive cough and on 11/30 ID added doxycycline.  She is reporting pleuritic chest pain as well.  She is also very malnourished and was started on tube feedings and developed mild electrolyte disturbances from refeeding syndrome which have resolved.  She has low-grade resting tachycardia with no significant abnormalities on echo and cardiology previously did not suspect cardiac etiology to her tachycardia.  Hepatitis C antibody has been positive; she is HIV negative.  She has had persistent elevation in her LFTs.  She is currently on methadone 20 mg 3 times a day  but will need to be on no more than 40 mg per 24 hours at time of discharge to be eligible for outpatient methadone treatment.  Subjective: States LUE in left lateral rib cage pain markedly improved.  No specific complaints or requests.  Objective: Vitals:   01/14/20 0513 01/14/20 1447  BP: 96/69 116/68  Pulse: 84 91  Resp: 17 14  Temp: 98.1 F (36.7 C) 97.8 F (36.6 C)  SpO2: 98% 99%    Intake/Output Summary (Last 24 hours) at 01/14/2020 1809 Last data filed at 01/14/2020  1000 Gross per 24 hour  Intake 540 ml  Output 400 ml  Net 140 ml   Filed Weights   12/31/19 0647 01/02/20 0500 01/04/20 0624  Weight: 41.4 kg 41.4 kg 42.1 kg    Exam: General: Alert, in no acute distress, calm watching TV Pulmonary: Room air saturations stable between 96 to 98%.  Bilateral lung sounds are clear to auscultation. Cardiac: Heart sounds S1-S2 without murmurs including over tricuspid area, no peripheral edema.  Pulses regular and nontachycardic at rest Abdomen: Soft nontender nondistended with normoactive bowel sounds auscultated.Marland KitchenLBM 12/30 Extremities: Symmetrical, LUE dressing clean dry and intact    Assessment/Plan: Acute problems: Septic shock 2/2 MRSA bacteremia and associated TV endocarditis status post angio VAC/abnormal lumbar imaging -ID commended 42 days of IV daptomycin via PICC inserted 11/30 (last dose should be 01/16/20)-CK stable -ID following  -Repeat echo 11/29 shows resolution of endocarditis but persistent severe tricuspid regurgitation -Repeat lumbar MRI revealed no evidence of infection in lumbar spine or other significant abnormality  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 Lightfoot's assistance.  -Pigtail catheter to close drainage system placed on 12/23 -Chest tube removed on 12/28 with follow-up chest x-ray unremarkable -Short acting Oxy decreased from 15 mg to 10 mg on 12/28.  On 12/30 will decrease to 5 mg and continue for 2 days then discontinue noting patient currently on chronic methadone.  Acute hypoxemic respiratory failure (multifactorial) 1 acute sepsis 2 septic emboli with sequela of small pleural effusion -Sepsis physiology and hypoxemia resolved -Continue oral doxycycline for sequela from septic pulmonary emboli-will stop at discharge -Chest x-ray 12/21 revealed loculated pleural fluid  possibly loculated pneumothorax-CT of the chest confirmed bronchopleural fistula -Encourage use of incentive spirometry  Persistent tachycardia/tricuspid valve insufficiency -Evaluated by CVTS and currently not a surgical candidate for cardiac valve replacement giving active IVDA prior to current admission (although was appropriate for angiovac surgery) -Echocardiogram: mild systolic LV dysfunction -cardiology felt this was  physiologic response to hypoalbuminemia/malnutrition, deconditioning and anemia. -Cardiology recommended to suppress physiologic tachycardia with beta-blockers nor utilize diuretics prophylactically due to increased risk of harm   Severe protein calorie malnutrition with associated refeeding syndrome -Initial BMI 17 patient briefly required tube feedings which have now been discontinued. -BMI continues to trend upward on regular diet with supplementation as below Ensure feeding supplements Nutrition Problem: Severe Malnutrition Etiology: acute illness (MRSA endocarditis) Signs/Symptoms: energy intake < or equal to 50% for > or equal to 5 days,percent weight loss (8.5% weight loss in less than 2 months) Percent weight loss: 8.5 % Interventions: Ensure Enlive (each supplement provides 350kcal and 20 grams of protein),Magic cup,MVI Estimated body mass index is 17.54 kg/m as calculated from the following:   Height as of this encounter: 5\' 1"  (1.549 m).   Weight as of this encounter: 42.1 kg.  Skin wound secondary to IV infiltration/right groin  wound/right neck incision     11/30/19                        12/20/19                         12/30/19  -ID also following along regarding this wound.  No indication to change current management and no indication to consult surgery -Dark eschar fell off on 12/10 as evidenced by pictures above wound continues to improve -Continue gabapentin 300 mg TID  Wound / Incision (Open or Dehisced) 12/10/19 Non-pressure wound Arm  Left;Posterior;Lateral infiltration/phlebitis (Active)  Date First Assessed/Time First Assessed: 12/10/19 1915   Wound Type: Non-pressure wound  Location: Arm  Location Orientation: Left;Posterior;Lateral  Wound Description (Comments): infiltration/phlebitis    Assessments 12/10/2019 11:45 PM 01/14/2020 10:27 AM  Dressing Type Thin film;Non adherent;Gauze (Comment) Impregnated gauze (petrolatum)  Dressing Changed New Changed  Dressing Status Intact Dry;Clean;Intact  Dressing Change Frequency PRN Daily  Site / Wound Assessment Painful;Red;Bleeding Bleeding;Red;Painful  % Wound base Red or Granulating -- 95%  % Wound base Yellow/Fibrinous Exudate -- 5%  % Wound base Other/Granulation Tissue (Comment) -- 0%  Peri-wound Assessment -- Intact  Closure None --  Drainage Amount Minimal Scant  Drainage Description Serosanguineous Serosanguineous  Treatment Other (Comment) Cleansed;Tape changed     No Linked orders to display    Chronic IV drug abuse with opiates/recent withdrawal syndrome -12/13: Discussed with patient that plan is to decrease to discharge dose of methadone 40 mg daily by next Monday 12/20 -Pam has been decreased to 0.25 mg BID and dose will be decreased to once daily today  GAD -As noted above weaning and decreasing benzodiazepine -Discussed with patient's mother and as I have also noted patient has significant anxiety issues -12/10: Lexapro 10 mg HS initiated and as of 12/24 will increase to 20 mg -Klonopin discontinued today 12/29      Other problems: Anemia of chronic disease/recent sepsis related thrombocytopenia -Thrombocytopenia has resolved -Hemoglobin stable around 8 -With initiation of NSAIDs daily Protonix has been started  Hyponatremia -Current sodium stable at 133  Hep C antibody positive with persistent transaminitis -Currently has nonobstructive transaminitis that has improved with improvement in nutrition -As of 12/3 albumin 2.0, AST 52, ALT normal  at 42 and total bilirubin has been normal -HCVRNA quantitative pending  LBP -Musculoskeletal in etiology noting no significant abnormalities on recent MRI -Continue supportive care with ibuprofen prn and lidocaine patch   Data Reviewed: Basic Metabolic Panel: Recent Labs  Lab 01/09/20 0419  NA 135  K 4.0  CL 100  CO2 27  GLUCOSE 110*  BUN 7  CREATININE 0.49  CALCIUM 8.8*   Liver Function Tests: No results for input(s): AST, ALT, ALKPHOS, BILITOT, PROT, ALBUMIN in the last 168 hours. No results for input(s): LIPASE, AMYLASE in the last 168 hours. No results for input(s): AMMONIA in the last 168 hours. CBC: Recent Labs  Lab 01/09/20 0419  WBC 7.6  HGB 9.2*  HCT 31.8*  MCV 89.1  PLT 289   Cardiac Enzymes: Recent Labs  Lab 01/11/20 1126 01/12/20 0346  CKTOTAL 23* 16*   BNP (last 3 results) Recent Labs    11/30/19 1809  BNP 583.0*    ProBNP (last 3 results) No results for input(s): PROBNP in the last 8760 hours.  CBG: No results for input(s): GLUCAP in the last 168 hours.  Recent Results (from the past 240  hour(s))  Body fluid culture     Status: None   Collection Time: 01/05/20  2:56 PM   Specimen: Pleura  Result Value Ref Range Status   Specimen Description PLEURAL FLUID  Final   Special Requests LEFT CHEST  Final   Gram Stain NO WBC SEEN NO ORGANISMS SEEN   Final   Culture   Final    NO GROWTH Performed at [facility], [address], [place], [place] [address]    Report Status 01/08/2020 FINAL  Final  MRSA PCR Screening     Status: None   Collection Time: 01/14/20 12:01 PM   Specimen: Nasal Mucosa; Nasopharyngeal  Result Value Ref Range Status   MRSA by PCR NEGATIVE NEGATIVE Final    Comment:        The GeneXpert MRSA Assay (FDA approved for NASAL specimens only), is one component of a comprehensive MRSA colonization surveillance program. It is not intended to diagnose MRSA infection nor to guide or monitor treatment for MRSA  infections. Performed at [facility], [place] [address], [place] [address]      Studies: No results found.  Scheduled Meds: . chlorhexidine  15 mL Mouth Rinse BID  . Chlorhexidine Gluconate Cloth  6 each Topical Daily  . collagenase   Topical Daily  . diclofenac Sodium  4 g Topical QID  . doxycycline  100 mg Oral Q12H  . escitalopram  20 mg Oral QHS  . feeding supplement  237 mL Oral TID BM  . gabapentin  300 mg Oral TID  . lidocaine  2 patch Transdermal Q24H  . mouth rinse  15 mL Mouth Rinse q12n4p  . methadone  20 mg Oral Q12H  . multivitamin with minerals  1 tablet Oral Daily  . pantoprazole  40 mg Oral Daily  . sodium chloride flush  10-40 mL Intracatheter Q12H  . sodium chloride flush  10-40 mL Intracatheter Q12H   Continuous Infusions: . sodium chloride 10 mL/hr at 12/05/19 0617  . sodium chloride Stopped (12/19/19 2156)  . DAPTOmycin (CUBICIN)  IV 500 mg (01/13/20 2052)    Principal Problem:   MRSA infection Active Problems:   Septic shock ([place])   Opioid use with withdrawal ([place])   Endocarditis of tricuspid valve   Palliative care encounter   Protein-calorie malnutrition, severe   Consultants:  PCCM  Infectious disease  CVTS  Cardiology  Palliative medicine  Procedures:  10/22 echocardiogram  10/24 TEE  11/17 complete echocardiogram  11/22 intraoperative TEE/application of angio [facility]  11/24 core track  11/29 Limited echocardiogram  Antibiotics: Anti-infectives (From admission, onward)   Start     Dose/Rate Route Frequency Ordered Stop   12/13/19 1115  doxycycline (VIBRA-TABS) tablet 100 mg        100 mg Oral Every 12 hours 12/13/19 1027 01/16/20 2359   12/07/19 2000  DAPTOmycin (CUBICIN) 500 mg in sodium chloride 0.9 % IVPB        500 mg 220 mL/hr over 30 Minutes Intravenous Daily 12/06/19 1345 01/16/20 2359   12/02/19 1400  vancomycin (VANCOREADY) IVPB 500 mg/100 mL  Status:  Discontinued        500 mg 100 mL/hr over 60  Minutes Intravenous Every 8 hours 12/02/19 1127 12/02/19 1131   12/02/19 1400  vancomycin (VANCOREADY) IVPB 750 mg/150 mL  Status:  Discontinued        750 mg 150 mL/hr over 60 Minutes Intravenous Every 8 hours 12/02/19 1131 12/06/19 1339  12/01/19 1200  vancomycin (VANCOREADY) IVPB 500 mg/100 mL  Status:  Discontinued        500 mg 100 mL/hr over 60 Minutes Intravenous Every 24 hours 11/30/19 1454 12/01/19 0946   12/01/19 1200  vancomycin (VANCOCIN) IVPB 1000 mg/200 mL premix  Status:  Discontinued        1,000 mg 200 mL/hr over 60 Minutes Intravenous Every 24 hours 12/01/19 0946 12/02/19 1127   11/30/19 1800  piperacillin-tazobactam (ZOSYN) IVPB 3.375 g  Status:  Discontinued        3.375 g 12.5 mL/hr over 240 Minutes Intravenous Every 8 hours 11/30/19 1454 12/02/19 0234   11/30/19 1115  piperacillin-tazobactam (ZOSYN) IVPB 3.375 g        3.375 g 100 mL/hr over 30 Minutes Intravenous  Once 11/30/19 1110 11/30/19 1306   11/30/19 1115  vancomycin (VANCOCIN) IVPB 1000 mg/200 mL premix        1,000 mg 200 mL/hr over 60 Minutes Intravenous  Once 11/30/19 1110 11/30/19 1337       Time spent: 32 minutes    Amazin Pincock , DO  [facility]  45 days

## 2020-01-15 LAB — CBC WITH DIFFERENTIAL/PLATELET
Abs Immature Granulocytes: 0.03 10*3/uL (ref 0.00–0.07)
Basophils Absolute: 0.1 10*3/uL (ref 0.0–0.1)
Basophils Relative: 1 %
Eosinophils Absolute: 0.2 10*3/uL (ref 0.0–0.5)
Eosinophils Relative: 3 %
HCT: 30.2 % — ABNORMAL LOW (ref 36.0–46.0)
Hemoglobin: 9.2 g/dL — ABNORMAL LOW (ref 12.0–15.0)
Immature Granulocytes: 0 %
Lymphocytes Relative: 26 %
Lymphs Abs: 2.1 10*3/uL (ref 0.7–4.0)
MCH: 26.2 pg (ref 26.0–34.0)
MCHC: 30.5 g/dL (ref 30.0–36.0)
MCV: 86 fL (ref 80.0–100.0)
Monocytes Absolute: 0.8 10*3/uL (ref 0.1–1.0)
Monocytes Relative: 10 %
Neutro Abs: 4.9 10*3/uL (ref 1.7–7.7)
Neutrophils Relative %: 60 %
Platelets: 254 10*3/uL (ref 150–400)
RBC: 3.51 MIL/uL — ABNORMAL LOW (ref 3.87–5.11)
RDW: 19.6 % — ABNORMAL HIGH (ref 11.5–15.5)
WBC: 8.1 10*3/uL (ref 4.0–10.5)
nRBC: 0 % (ref 0.0–0.2)

## 2020-01-15 LAB — BASIC METABOLIC PANEL
Anion gap: 7 (ref 5–15)
BUN: 11 mg/dL (ref 6–20)
CO2: 28 mmol/L (ref 22–32)
Calcium: 9.1 mg/dL (ref 8.9–10.3)
Chloride: 98 mmol/L (ref 98–111)
Creatinine, Ser: 0.43 mg/dL — ABNORMAL LOW (ref 0.44–1.00)
GFR, Estimated: >60 mL/min (ref >60)
Glucose, Bld: 92 mg/dL (ref 70–99)
Potassium: 4.2 mmol/L (ref 3.5–5.1)
Sodium: 133 mmol/L — ABNORMAL LOW (ref 135–145)

## 2020-01-15 NOTE — Progress Notes (Signed)
[facility]  PROGRESS NOTE  Amber [name] ZOX:[id number] DOB: 01/03/1991 DOA: 11/30/2019 PCP: Patient, No Pcp Per Admit date - 11/30/2019   Admitting Physician Oretha Milchakesh Alva V, MD  Outpatient Primary MD for the patient is Patient, No Pcp Per  LOS - 46 Brief Narrative   Amber [name] is a 30 y.o. year old female with medical history significant for known IV drug use and known tricuspid MRSA endocarditis who presented with progressive infection after leaving AMA and was admitted for progressive MRSA infection in setting of leaving AGAINST MEDICAL ADVICE with hospital course complicated by hypoxemia, hypotension, pleuritic chest pain and tachycardia as well as elevated LFTs found to have septic shock secondary to MRSA bacteremia complicated by septic emboli, left bronchopleural fistula requiring chest tube and acute hypoxic respiratory failure on the setting of tricuspid valve endocarditis    Subjective  No acute complaints, no acute events overnight A & P    Septic shock secondary to MRSA bacteremia and associated tricuspid valve endocarditis status post NGO VAC.  Septic shock resolved. -Continue IV daptomycin via PICC as recommended by ID  Chest pain secondary to left bronchopleural fistula, stable.  Denies any chest pain this morning.  Chest tube removed on 12/28.  Acute hypoxic respiratory failure multifactorial etiology from sepsis as well as septic emboli and small pleural effusion.  Sepsis physiology and hypoxemia resolved.  Stable on room air. -Continue doxycycline, encourage incentive spirometry  No changes to remaining chronic comorbidities managed during this prolonged hospitalization as mentioned in progress note on 01/14/2020    Family Communication  : None  Code Status : Full  Disposition Plan  :  Patient is from home. Anticipated d/c date:  Greater than 3 days. Barriers to d/c or necessity for inpatient status:  Needs to complete full course of IV antibiotic  therapy for endocarditis with septic emboli while in hospital due to high risk for poor adherence in setting of persistent substance abuse   DVT Prophylaxis  : SCDs MDM: The below labs and imaging reports were reviewed and summarized above.  Medication management as above.  Lab Results  Component Value Date   PLT 254 01/15/2020    Diet :  Diet Order            Diet regular Room service appropriate? Yes; Fluid consistency: Thin  Diet effective now                  Inpatient Medications Scheduled Meds: . chlorhexidine  15 mL Mouth Rinse BID  . Chlorhexidine Gluconate Cloth  6 each Topical Daily  . collagenase   Topical Daily  . COVID-19 mRNA vaccine (Pfizer)  0.3 mL Intramuscular ONCE-1600  . diclofenac Sodium  4 g Topical QID  . doxycycline  100 mg Oral Q12H  . escitalopram  20 mg Oral QHS  . feeding supplement  237 mL Oral TID BM  . gabapentin  300 mg Oral TID  . lidocaine  2 patch Transdermal Q24H  . mouth rinse  15 mL Mouth Rinse q12n4p  . methadone  20 mg Oral Q12H  . multivitamin with minerals  1 tablet Oral Daily  . pantoprazole  40 mg Oral Daily  . sodium chloride flush  10-40 mL Intracatheter Q12H  . sodium chloride flush  10-40 mL Intracatheter Q12H   Continuous Infusions: . sodium chloride 10 mL/hr at 12/05/19 0617  . sodium chloride Stopped (12/19/19 2156)  . DAPTOmycin (CUBICIN)  IV 500 mg (01/15/20 2012)  PRN Meds:.acetaminophen, alum & mag hydroxide-simeth, dextrose, docusate sodium, feeding supplement (NEPRO CARB STEADY), guaiFENesin, HYDROcodone-homatropine, ibuprofen, ondansetron (ZOFRAN) IV, oxyCODONE, phenol, polyethylene glycol, simethicone, sodium chloride flush, sodium chloride flush  Antibiotics  :   Anti-infectives (From admission, onward)   Start     Dose/Rate Route Frequency Ordered Stop   12/13/19 1115  doxycycline (VIBRA-TABS) tablet 100 mg        100 mg Oral Every 12 hours 12/13/19 1027 01/16/20 2359   12/07/19 2000  DAPTOmycin  (CUBICIN) 500 mg in sodium chloride 0.9 % IVPB        500 mg 220 mL/hr over 30 Minutes Intravenous Daily 12/06/19 1345 01/16/20 2359   12/02/19 1400  vancomycin (VANCOREADY) IVPB 500 mg/100 mL  Status:  Discontinued        500 mg 100 mL/hr over 60 Minutes Intravenous Every 8 hours 12/02/19 1127 12/02/19 1131   12/02/19 1400  vancomycin (VANCOREADY) IVPB 750 mg/150 mL  Status:  Discontinued        750 mg 150 mL/hr over 60 Minutes Intravenous Every 8 hours 12/02/19 1131 12/06/19 1339   12/01/19 1200  vancomycin (VANCOREADY) IVPB 500 mg/100 mL  Status:  Discontinued        500 mg 100 mL/hr over 60 Minutes Intravenous Every 24 hours 11/30/19 1454 12/01/19 0946   12/01/19 1200  vancomycin (VANCOCIN) IVPB 1000 mg/200 mL premix  Status:  Discontinued        1,000 mg 200 mL/hr over 60 Minutes Intravenous Every 24 hours 12/01/19 0946 12/02/19 1127   11/30/19 1800  piperacillin-tazobactam (ZOSYN) IVPB 3.375 g  Status:  Discontinued        3.375 g 12.5 mL/hr over 240 Minutes Intravenous Every 8 hours 11/30/19 1454 12/02/19 0234   11/30/19 1115  piperacillin-tazobactam (ZOSYN) IVPB 3.375 g        3.375 g 100 mL/hr over 30 Minutes Intravenous  Once 11/30/19 1110 11/30/19 1306   11/30/19 1115  vancomycin (VANCOCIN) IVPB 1000 mg/200 mL premix        1,000 mg 200 mL/hr over 60 Minutes Intravenous  Once 11/30/19 1110 11/30/19 1337       Objective   Vitals:   01/14/20 2049 01/15/20 0444 01/15/20 1633 01/15/20 2001  BP: 98/61 104/65 108/61 110/68  Pulse: 91 91 90 93  Resp: 17   18  Temp: 98.7 F (37.1 C) 98.3 F (36.8 C) 97.8 F (36.6 C) 97.8 F (36.6 C)  TempSrc: Oral Oral Oral Oral  SpO2: 99% 97% 100% 100%  Weight:      Height:        SpO2: 100 % O2 Flow Rate (L/min): 2 L/min FiO2 (%): 40 %  Wt Readings from Last 3 Encounters:  01/04/20 42.1 kg  11/07/19 49.3 kg  11/05/19 50.4 kg     Intake/Output Summary (Last 24 hours) at 01/15/2020 2025 Last data filed at 01/15/2020  1900 Gross per 24 hour  Intake 360 ml  Output 152 ml  Net 208 ml    Physical Exam:     Frail young woman who looks older than stated age, flat affect, resting comfortably in bed No new F.N deficits,  [place].AT, Normal respiratory effort on room air, CTAB RRR,No Gallops,Rubs or new Murmurs,  +ve B.Sounds, Abd Soft, No tenderness, No rebound, guarding or rigidity. No Cyanosis, No new Rash or bruise     I have personally reviewed the following:   Data Reviewed:  CBC Recent Labs  Lab 01/09/20 0419 01/15/20 0404  WBC 7.6 8.1  HGB 9.2* 9.2*  HCT 31.8* 30.2*  PLT 289 254  MCV 89.1 86.0  MCH 25.8* 26.2  MCHC 28.9* 30.5  RDW 19.9* 19.6*  LYMPHSABS  --  2.1  MONOABS  --  0.8  EOSABS  --  0.2  BASOSABS  --  0.1    Chemistries  Recent Labs  Lab 01/09/20 0419 01/15/20 0404  NA 135 133*  K 4.0 4.2  CL 100 98  CO2 27 28  GLUCOSE 110* 92  BUN 7 11  CREATININE 0.49 0.43*  CALCIUM 8.8* 9.1   ------------------------------------------------------------------------------------------------------------------ No results for input(s): CHOL, HDL, LDLCALC, TRIG, CHOLHDL, LDLDIRECT in the last 72 hours.  Lab Results  Component Value Date   HGBA1C 6.3 (H) 11/04/2019   ------------------------------------------------------------------------------------------------------------------ No results for input(s): TSH, T4TOTAL, T3FREE, THYROIDAB in the last 72 hours.  Invalid input(s): FREET3 ------------------------------------------------------------------------------------------------------------------ No results for input(s): VITAMINB12, FOLATE, FERRITIN, TIBC, IRON, RETICCTPCT in the last 72 hours.  Coagulation profile No results for input(s): INR, PROTIME in the last 168 hours.  No results for input(s): DDIMER in the last 72 hours.  Cardiac Enzymes No results for input(s): CKMB, TROPONINI, MYOGLOBIN in the last 168 hours.  Invalid input(s):  CK ------------------------------------------------------------------------------------------------------------------    Component Value Date/Time   BNP 583.0 (H) 11/30/2019 1809    Micro Results Recent Results (from the past 240 hour(s))  MRSA PCR Screening     Status: None   Collection Time: 01/14/20 12:01 PM   Specimen: Nasal Mucosa; Nasopharyngeal  Result Value Ref Range Status   MRSA by PCR NEGATIVE NEGATIVE Final    Comment:        The GeneXpert MRSA Assay (FDA approved for NASAL specimens only), is one component of a comprehensive MRSA colonization surveillance program. It is not intended to diagnose MRSA infection nor to guide or monitor treatment for MRSA infections. Performed at [facility], [address] [place], [place] [address]     Radiology Reports DG Chest 1 View  Result Date: 01/10/2020 CLINICAL DATA:  Chest tube removal. EXAM: CHEST  1 VIEW COMPARISON:  Same day. FINDINGS: Stable cardiomediastinal silhouette. Left-sided chest tube has been removed. No pneumothorax is noted. Right-sided PICC line is unchanged. Stable pleural thickening or loculated pleural effusion is noted laterally in left lung base. Stable other bilateral lung opacities are noted concerning for multifocal inflammation. Bony thorax is unremarkable. IMPRESSION: Stable pleural thickening or loculated pleural effusion laterally in left lung base. Stable other bilateral lung opacities are noted concerning for multifocal inflammation. No pneumothorax is noted status post chest tube removal. Electronically Signed   By: Lupita Raider M.D.   On: 01/10/2020 15:35   DG Chest 2 View  Result Date: 01/09/2020 CLINICAL DATA:  Recent chest tube placement EXAM: CHEST - 2 VIEW COMPARISON:  January 08, 2020 chest radiograph; chest CT January 03, 2020 FINDINGS: Chest tube again noted on the left. No appreciable pneumothorax. Consolidation along the lateral aspect of the left lung appears stable.  There is mild subcutaneous air on the left. Central catheter tip is in the superior vena cava near the cavoatrial junction. Patchy airspace opacity on the right remains with areas of subtle cavitation, better delineated on recent CT. There is atelectatic change in the medial left lower lung region, similar to 1 day prior. Heart size and pulmonary vascularity are normal. No adenopathy. No appreciable bone lesions. IMPRESSION: Tube and catheter positions are unchanged. No pneumothorax. Persistent airspace opacity in the area  of chest tube in the periphery of the left mid and lower lung regions. Subtle areas of cavitation at multiple sites on the right with multiple foci of airspace opacity on the right. Atelectasis left base. No new opacity evident. Stable cardiac silhouette. No adenopathy appreciable by radiography. Electronically Signed   By: Bretta Bang III M.D.   On: 01/09/2020 09:26   CT CHEST WO CONTRAST  Result Date: 01/09/2020 CLINICAL DATA:  History of LEFT hydropneumothorax status post LEFT chest tube placement on 01/05/2020. EXAM: CT CHEST WITHOUT CONTRAST TECHNIQUE: Multidetector CT imaging of the chest was performed following the standard protocol without IV contrast. COMPARISON:  Chest CT dated 01/03/2020. FINDINGS: Cardiovascular: Thoracic aorta is normal in caliber and configuration. No pericardial effusion. Mediastinum/Nodes: No mass or enlarged lymph nodes are seen within the mediastinum. Trachea and central bronchi are unremarkable. Lungs/Pleura: LEFT-sided chest tube in place. Chest tube is well positioned within the previously described complex collection of fluid and air in the LEFT pleural space. This component of the LEFT-sided pleural collection is nearly completely resolved. There remains complex loculated-appearing collections of fluid and air along the posterior and medial aspects of the LEFT lower lobe, measuring 4 cm and 5 cm greatest dimension respectively. The additional  previous described multiple patchy/nodular opacities within the bilateral lungs, some cavitary, are not significantly changed in the short-term interval, compatible with atypical pneumonia versus septic emboli. Upper Abdomen: Limited images of the upper abdomen are unremarkable. Musculoskeletal: No acute appearing osseous abnormality IMPRESSION: 1. LEFT-sided chest tube is well positioned within the previously described complex collection of fluid and air in the LEFT pleural space. This component of the LEFT-sided pleural collection is nearly completely resolved. 2. There remains complex loculated-appearing collections of fluid and air along the posterior and medial aspects of the LEFT lower lobe, largest component at the medial aspect of the LEFT lung base measuring 5 cm greatest dimension. These may be secondary to bronchopleural fistula, as previously suggested. 3. Multiple patchy/nodular consolidations are again seen throughout the bilateral lungs, some cavitary, not significantly changed in the short-term interval, compatible with atypical pneumonia versus septic emboli. Favor septic emboli based on clinical data of drug abuse provided on previous exams. Electronically Signed   By: Bary Richard M.D.   On: 01/09/2020 17:51   CT CHEST W CONTRAST  Result Date: 01/03/2020 CLINICAL DATA:  Suspected loculated pneumothorax, evaluate for bronchopleural fistula EXAM: CT CHEST WITH CONTRAST TECHNIQUE: Multidetector CT imaging of the chest was performed during intravenous contrast administration. CONTRAST:  46mL OMNIPAQUE IOHEXOL 300 MG/ML  SOLN COMPARISON:  Chest radiograph dated 01/03/2020. CT chest dated 12/16/2019 FINDINGS: Cardiovascular: The heart is normal in size. Trace inferior pericardial fluid. No evidence of thoracic aortic aneurysm. Right arm PICC terminates in the upper right atrium. Mediastinum/Nodes: Small mediastinal lymph nodes which do not meet pathologic CT size criteria. Visualized thyroid is  unremarkable. Lungs/Pleura: Multiple patchy/nodular opacities in the lungs bilaterally, some of which are cavitary, improved when compared to the prior. For example, a 17 x 11 mm nodule in the posterior right upper lobe (series 4/image 35), previously measured 22 x 16 mm. A 19 x 9 mm nodule in the medial left upper lobe (series 4/image 62) previously measured 20 x 13 mm. This appearance continues to favor multifocal pneumonia on the basis of septic emboli. Moderate gas and fluid collection in the left pleural space, previously reflecting pleural fluid. Suspected direct communication via a dilated terminal bronchiole in the left anterior lingula (series 4/images  70-71), reflecting a bronchopleural fistula. Mild patchy opacities in the left lower lobe, atelectasis versus pneumonia. Trace right pleural effusion. Upper Abdomen: Visualized upper abdomen is grossly unremarkable. Musculoskeletal: Visualized osseous structures are within normal limits. IMPRESSION: Improving multifocal pneumonia, likely on the basis of septic emboli. 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 Trace right pleural effusion. Electronically Signed   By: Charline Bills M.D.   On: 01/03/2020 13:08   MR Lumbar Spine W Wo Contrast  Result Date: 12/22/2019 CLINICAL DATA:  30 year old female with sepsis, MRSA bacteremia, endocarditis. Continued back pain, but no evidence of spinal infection on 12/08/2019 total spine MRI. EXAM: MRI LUMBAR SPINE WITHOUT AND WITH CONTRAST TECHNIQUE: Multiplanar and multiecho pulse sequences of the lumbar spine were obtained without and with intravenous contrast. CONTRAST:  21mL GADAVIST GADOBUTROL 1 MMOL/ML IV SOLN COMPARISON:  Total spine MRI 12/08/2019, and earlier. FINDINGS: Segmentation: Lumbar segmentation appears to be normal and will be designated as such for this report. Alignment:  Stable lumbar lordosis. No spondylolisthesis. Vertebrae:  Generalized decreased T1 and T2 marrow signal in the visible spine and pelvis, probably due to red marrow reactivation. No marrow edema or evidence of acute osseous abnormality. Visible SI joints appear normal. Conus medullaris and cauda equina: Conus extends to the L1 level. No lower spinal cord or conus signal abnormality. No abnormal intradural enhancement. No dural thickening. Normal cauda equina nerve roots. Paraspinal and other soft tissues: Suggestion of anasarca, body wall edema. Negative visible abdominal viscera. No focal lumbar paraspinal soft tissue inflammation or enhancement. Disc levels: Intervertebral disc signal and morphology remains normal from T11-T12 through L5-S1. No significant degenerative changes. No spinal or foraminal stenosis. IMPRESSION: 1. Stable, Negative MRI appearance of the Lumbar Spine aside from probable red marrow reactivation throughout the visible spine and pelvis. 2. Suggestion of generalized body wall edema, anasarca. Electronically Signed   By: Odessa Fleming M.D.   On: 12/22/2019 15:47   DG Chest Port 1 View  Result Date: 01/10/2020 CLINICAL DATA:  30 year old female with prior chest tube EXAM: PORTABLE CHEST 1 VIEW COMPARISON:  CT chest 01/09/2020, plain film 01/09/2020 FINDINGS: Cardiomediastinal silhouette unchanged in size and contour. Similar appearance focal nodular opacities of the lungs as well as pleuroparenchymal thickening along the left lateral pleura. Unchanged position of left pigtail thoracostomy with a slight kink at the chest wall and trance site. No pneumothorax. Unchanged right upper extremity PICC. IMPRESSION: Unchanged appearance of the chest x-ray with persisting left pigtail thoracostomy tube and residual lateral pleural thickening/fluid. Unchanged right upper extremity PICC, and plain film appearance of known infectious lung nodules. Electronically Signed   By: Gilmer Mor D.O.   On: 01/10/2020 13:41   DG Chest Port 1 View  Result Date:  01/08/2020 CLINICAL DATA:  Evaluate pleural fluid and pneumothorax EXAM: PORTABLE CHEST 1 VIEW COMPARISON:  Five days ago FINDINGS: Chest drain on the left with no visible residual gas collection. There is adjacent pleural fluid/thickening along the lateral chest wall. Multiple pulmonary nodules, some cavitary (as along the paramediastinal left lower lobe). No apical pneumothorax. Stable heart size. Right PICC with tip at the upper cavoatrial junction. IMPRESSION: Improvement in fluid collection in the left chest after percutaneous drainage. No new abnormality. Electronically Signed   By: Marnee Spring M.D.   On: 01/08/2020 07:14   DG CHEST PORT 1 VIEW  Result Date: 01/03/2020 CLINICAL DATA:  Pleuritic chest pain.  History of drug overdose EXAM: PORTABLE CHEST  1 VIEW COMPARISON:  12/11/2019 FINDINGS: Right PICC with tip at the upper right atrium. Patchy pulmonary opacity with cavitary features as previously demonstrated. In place of loculated pleural fluid at the lateral left chest there is now an ovoid lucent area. Normal heart size. Probable splenomegaly IMPRESSION: Lucent area in the lateral left chest where there was previously loculated pleural fluid; suspect loculated pneumothorax. Consider chest CT to evaluate for bronchopleural fistula. Bilateral cavitary pneumonia which has likely improved. Electronically Signed   By: Marnee Spring M.D.   On: 01/03/2020 08:52   CT [facility] PLEURAL DRAIN W/INDWELL CATH W/IMG GUIDE  Result Date: 01/05/2020 INDICATION: 30 year old with known IV drug abuse and tricuspid MRSA endocarditis. 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 CT surgery recommended placement of a image guided chest tube. EXAM: CT-GUIDED CHEST TUBE PLACEMENT MEDICATIONS: Moderate sedation ANESTHESIA/SEDATION: Fentanyl 50 mcg IV; Versed 1.0 mg IV Moderate Sedation Time:  23 minutes The patient was continuously monitored during the procedure by the  interventional radiology nurse under my direct supervision. COMPLICATIONS: None immediate. PROCEDURE: Informed written consent was obtained from the patient after a thorough discussion of the procedural risks, benefits and alternatives. All questions were addressed. Maximal Sterile Barrier Technique was utilized including caps, mask, sterile gowns, sterile gloves, sterile drape, hand hygiene and skin antiseptic. A timeout was performed prior to the initiation of the procedure. Patient was placed supine on the CT scanner with the left side mildly elevated. CT images through the chest were obtained. Left mid axillary region was prepped with chlorhexidine and sterile field was created. Skin and soft tissues were anesthetized using 1% lidocaine. Small incision was made. Using CT guidance, a Yueh catheter was directed into the left pleural space. Gas was aspirated. A small amount of yellow fluid was aspirated. Superstiff Amplatz wire was advanced into the pleural space and the tract was dilated to accommodate a 14 [place] multipurpose drain. Catheter was attached to chest tube drainage system and attached to wall suction. Follow up CT images were obtained. Catheter was sutured to skin. FINDINGS: Again noted are numerous irregular opacities throughout both lungs and concerning for septic emboli. There is a large air-fluid collection in the left lower chest and similar to the recent CT on 01/03/2020. Hydropneumothorax was slightly smaller in size on the post drain images. Gas within the subcutaneous tissues on the post drain images. IMPRESSION: CT-guided placement of a chest tube within the left hydropneumothorax. Electronically Signed   By: Richarda Overlie M.D.   On: 01/05/2020 18:46     Time Spent in minutes  30     Laverna Peace M.D on 01/15/2020 at 8:25 PM  To page go to [email or web address] - password [facility]

## 2020-01-16 ENCOUNTER — Other Ambulatory Visit (HOSPITAL_COMMUNITY): Payer: Self-pay | Admitting: Nurse Practitioner

## 2020-01-16 ENCOUNTER — Encounter (HOSPITAL_COMMUNITY): Payer: Self-pay | Admitting: Pulmonary Disease

## 2020-01-16 DIAGNOSIS — F411 Generalized anxiety disorder: Secondary | ICD-10-CM | POA: Diagnosis present

## 2020-01-16 DIAGNOSIS — D638 Anemia in other chronic diseases classified elsewhere: Secondary | ICD-10-CM | POA: Diagnosis present

## 2020-01-16 DIAGNOSIS — I071 Rheumatic tricuspid insufficiency: Secondary | ICD-10-CM | POA: Diagnosis present

## 2020-01-16 DIAGNOSIS — J948 Other specified pleural conditions: Secondary | ICD-10-CM | POA: Diagnosis not present

## 2020-01-16 DIAGNOSIS — I2690 Septic pulmonary embolism without acute cor pulmonale: Secondary | ICD-10-CM | POA: Diagnosis present

## 2020-01-16 DIAGNOSIS — B182 Chronic viral hepatitis C: Secondary | ICD-10-CM | POA: Diagnosis present

## 2020-01-16 DIAGNOSIS — T801XXD Vascular complications following infusion, transfusion and therapeutic injection, subsequent encounter: Secondary | ICD-10-CM

## 2020-01-16 MED ORDER — DICLOFENAC SODIUM 1 % EX GEL: 4.0000 g | Freq: Four times a day (QID) | CUTANEOUS | 6 refills | Status: DC

## 2020-01-16 MED ORDER — LIDOCAINE 5 % EX PTCH: 2.0000 | MEDICATED_PATCH | CUTANEOUS | 0 refills | Status: DC

## 2020-01-16 MED ORDER — SODIUM CHLORIDE 0.9 % IV SOLN
500.0000 mg | Freq: Once | INTRAVENOUS | Status: AC
  Administered 2020-01-16: 500 mg via INTRAVENOUS
  Filled 2020-01-16 (×2): qty 10

## 2020-01-16 MED FILL — METHADONE HCL 10 MG TABLET: 10 | 7 days supply | Qty: 30 | Fill #0

## 2020-01-16 MED FILL — LIDOCAINE PATCH 5%: 5 | 15 days supply | Qty: 30 | Fill #0

## 2020-01-16 MED FILL — ESCITALOPRAM 20 MG TABLET: 20 | 30 days supply | Qty: 30 | Fill #0

## 2020-01-16 MED FILL — GABAPENTIN 600 MG TABLET: 600 | 30 days supply | Qty: 45 | Fill #0

## 2020-01-16 MED FILL — DICLOFENAC SODIUM 1 % GEL: 1 | 18 days supply | Qty: 300 | Fill #0

## 2020-01-16 NOTE — Progress Notes (Signed)
Patient refused dressing at this time since she wanted to go back to sleep and requested that it will be done by day shift RN later today.  Will endorse to day shift RN accordingly.

## 2020-01-16 NOTE — Discharge Summary (Signed)
Physician Discharge Summary  Amber [name] AVW:[id number] DOB: 03/06/1990 DOA: 11/30/2019  PCP: Patient, No Pcp Per  Admit date: 11/30/2019 Discharge date: 01/16/2020  Time spent: 43 miutes       Recommendations for Outpatient Follow-up:  1. Patient will need to follow-up with cardiothoracic surgeon Dr. Cliffton AstersLightfoot after discharge.  If patient is able to remain free of IV drugs including heroin for multiple months she will likely be a candidate for tricuspid valve replacement. 2. Patient will need to follow-up at methadone clinic after discharge.  Her mother has been assisting with outpatient options patient will [facility]. 3. Patient will continue to need daily wound care to left upper extremity wound.  Nonstick dressing or Vaseline gauze can be applied directly to the wound and covered with dry dressing and wrapped in Kerlix. 4. Patient will need to establish with a primary care physician after discharge.  5. Patient will require outpatient PT and a rolling walker at discharge.  This has been arranged on date of discharge.   Discharge Diagnoses:  Principal Problem:   MRSA infection Active Problems:   Septic shock (HCC)   Opioid use with withdrawal (HCC)   Endocarditis of tricuspid valve   Palliative care encounter   Protein-calorie malnutrition, severe   IV infiltrate, subsequent encounter   Septic pulmonary embolism without acute cor pulmonale (HCC)   Hydropneumothorax   Severe tricuspid regurgitation   GAD (generalized anxiety disorder)   Hepatitis C, chronic (HCC)   Anemia, chronic disease   Discharge Condition: Stable  Diet recommendation: Regular  Filed Weights   12/31/19 0647 01/02/20 0500 01/04/20 0624  Weight: 41.4 kg 41.4 kg 42.1 kg    History of present illness:  30 year old female patient with known IV drug abuse with known tricuspid MRSA endocarditis who signed out AMA x2 prior to this admission.  She presented with progressive infection.   On 10/21 through 10/23 she was admitted with fever with blood cultures positive for MRSA.  CT of the chest demonstrated septic emboli and edematous L4-5 facet joint (? Evolving osteomyelitis) as well as tricuspid vegetation on CT.  She left AMA.  She returned 2 hours later on 10/23 with severe back pain.  Underwent TEE that demonstrated a 1 cm tricuspid valve mass.  She was evaluated by CT surgery who recommended medical management.  Again she left AMA on 10/25.  Patient was staying with friends but presented back to the hospital due to increasing shortness of breath, weakness, fatigue malaise and nausea.  She arrived in the parking lot obtunded and was found to have multiple needles in her car on 11/17.  In the ER she was hypotensive and started on pressors.  Hemoglobin was low and she was given a blood transfusion.  She was also started on empiric IV vancomycin for known MRSA infection  and was admitted to the critical care service.  She eventually was taken to the OR for angio VAC during this admission.  She has subsequently transitioned out of ICU to stepdown unit.  She has had an IV infiltration and has a superficial wound on her left forearm. ID continues to follow regarding the endocarditis and recommends a total of 6 weeks therapy be daptomycin.  She has continued to have hypoxemia and productive cough and on 11/30 ID added doxycycline.  She is reporting pleuritic chest pain as well.  She is also very malnourished and was started on tube feedings and developed mild electrolyte disturbances from refeeding syndrome which  have resolved.  She has low-grade resting tachycardia with no significant abnormalities on echo and cardiology previously did not suspect cardiac etiology to her tachycardia.  Hepatitis C antibody has been positive; she is HIV negative.  She has had persistent elevation in her LFTs.  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 Cliffton Asters with TCTS recommended placement of pigtail catheter to closeD drainage system placed on 12/23 by IR -Chest tube removed on 12/28 with follow-up chest x-ray unremarkable  Acute hypoxemic respiratory failure (multifactorial) 1 acute sepsis 2 septic emboli with sequela of small pleural effusion -Sepsis physiology and hypoxemia resolved -Will complete oral doxycycline on date of discharge. 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 -Cardiology recommended to suppress physiologic tachycardia with beta-blockers nor utilize diuretics prophylactically due to increased risk of harm   Severe protein calorie malnutrition with associated refeeding syndrome -Initial BMI 17 patient briefly required tube feedings which have now been discontinued. -BMI continues to trend upward on regular diet with supplementation as  below Ensure feeding supplements Nutrition Problem: Severe Malnutrition Etiology: acute illness (MRSA endocarditis) Signs/Symptoms: energy intake < or equal to 50% for > or equal to 5 days,percent weight loss (8.5% weight loss in less than 2 months) Percent weight loss: 8.5 % Interventions: Ensure Enlive (each supplement provides 350kcal and 20 grams of protein),Magic cup,MVI Estimated body mass index is 17.54 kg/m as calculated from the following:   Height as of this encounter: 5\' 1"  (1.549 m).   Weight as of this encounter: 42.1 kg.    Skin wound secondary to IV infiltration/right groin wound/right neck incision  11/30/2019                  12/20/2019    12/30/2019                  01/16/2020  -ID also following along regarding this wound.  No indication to change current management and no indication to consult surgery -Dark eschar fell off on 12/10 as evidenced by pictures above wound continues to improve -Continue gabapentin 300 mg TID     Wound / Incision (Open or Dehisced) 12/10/19 Non-pressure wound Arm Left;Posterior;Lateral infiltration/phlebitis (Active)  Date First Assessed/Time First Assessed: 12/10/19 1915   Wound Type: Non-pressure wound  Location: Arm  Location Orientation: Left;Posterior;Lateral  Wound Description (Comments): infiltration/phlebitis    Assessments 12/10/2019 11:45 PM 01/11/2020  9:00 PM  Dressing Type Thin film;Non adherent;Gauze (Comment) Abdominal pads;Gauze (Comment)  Dressing Changed New New  Dressing Status Intact Clean;Dry;Intact;Other (Comment)  Dressing Change Frequency PRN Daily  Site / Wound Assessment Painful;Red;Bleeding --  Closure None --  Drainage Amount Minimal --  Drainage Description Serosanguineous --  Treatment Other (Comment) --     No Linked orders to display    Chronic IV drug abuse with opiates/recent withdrawal syndrome -12/13: Discussed with patient that plan is to decrease to discharge dose of methadone 40 mg  daily by next Monday 12/20 -Pam has been decreased to 0.25 mg BID and dose will be decreased to once daily today  GAD -As noted above weaning and decreasing benzodiazepine -Discussed with patient's mother and as I have also noted patient has significant anxiety issues -12/10: Lexapro 10 mg HS initiated and as of 12/24 increased to 20 mg -Klonopin discontinued 12/29    Other problems: Anemia of chronic disease/recent sepsis related thrombocytopenia -Thrombocytopenia has resolved -Hemoglobin stable around 8 -With initiation of NSAIDs daily Protonix has been started  Hyponatremia -Current sodium stable at 133  Hep C antibody positive with persistent transaminitis -Currently has nonobstructive transaminitis that has improved with improvement in nutrition -As of 12/3 albumin 2.0, AST 52, ALT normal at 42 and total bilirubin has been normal -HCVRNA quantitative pending  LBP -Musculoskeletal in etiology noting no significant abnormalities on recent MRI -Continue supportive care with ibuprofen prn and lidocaine patch    Procedures:  10/22 echocardiogram  10/24 TEE  11/17 complete echocardiogram  11/22 intraoperative TEE/application of angio [facility]  11/24 core track  11/29 Limited echocardiogram    Consultations:  PCCM  Infectious disease  CVTS  Cardiology  Palliative medicine   Discharge Exam: Vitals:   01/15/20 2001 01/16/20 0435  BP: 110/68 109/66  Pulse: 93 92  Resp: 18 17  Temp: 97.8 F (36.6 C) 97.9 F (36.6 C)  SpO2: 100% 100%   General: Awake, calm, no acute distress Pulmonary: Bilateral lung sounds clear to auscultation anteriorly, slightly tender over previous left lateral chest tube site.  Room air sats stable at 97% Cardiac: Heart sounds S1-S2 without murmurs including over tricuspid area, no JVD and no peripheral edema.  Pulses regular and nontachycardic at rest Abdomen: Soft nontender nondistended with normoactive bowel sounds  auscultated.Marland KitchenLBM 12/28 Extremities: Symmetrical, LUE dressing clean dry and intact   Discharge Instructions   Discharge Instructions    Call MD for:  extreme fatigue   Complete by: As directed    Call MD for:  persistant dizziness or light-headedness   Complete by: As directed    Call MD for:  redness, tenderness, or signs of infection (pain, swelling, redness, odor or green/yellow discharge around incision site)   Complete by: As directed    Call MD for:  severe uncontrolled pain   Complete by: As directed    Call MD for:  temperature >100.4   Complete by: As directed    Diet - low sodium heart healthy   Complete by: As directed    Discharge instructions   Complete by: As directed    Please follow-up with [facility] or methadone treatment facility of your choice as previously discussed.  You have been given a prescription for methadone for a total of 5 days.  Please continue wound care to left upper extremity.  He will likely need to establish with a primary care physician as well to manage your basic medical problems and to follow-up on this wound.  You were started on Lexapro this admission to help with your anxiety.  Please continue this medication unless otherwise discontinued by your physician.  Make sure you call the office of Dr. Kipp Brood to schedule follow-up appointment.  If you are able to remain "clean" for a sustained amount of time he will likely be eligible for repair of your leaky tricuspid valve.   Discharge wound care:   Complete by: As directed    Cleanse wound with gentle soap and water daily.  Apply a nonstick dressing such as a Telfa pad (if you can obtain a Vaseline gauze dressing you can use this instead).  Cover dressing with a dry dressing and wrapped in gauze   Increase activity slowly   Complete by: As directed      Allergies as of 01/16/2020      Reactions   Bee Venom Anaphylaxis      Medication List    STOP taking these medications   ondansetron 4  MG disintegrating tablet Commonly known as: Zofran ODT     TAKE these medications   acetaminophen 500 MG tablet Commonly known as: TYLENOL Take 1,000 mg by mouth every 6 (six) hours as needed for headache.   diclofenac Sodium 1 % Gel Commonly known as: VOLTAREN Apply 4 g topically 4 (four) times daily.   escitalopram 20 MG tablet Commonly known as: LEXAPRO Take 1 tablet (20 mg total) by mouth at bedtime.   gabapentin 600 MG tablet Commonly known as: NEURONTIN Take 0.5 tablets (300 mg total) by mouth 3 (three) times daily.   ibuprofen 600 MG tablet Commonly known as: ADVIL Take 1 tablet (600 mg total) by mouth every 6 (six) hours as needed.   lidocaine 5 % Commonly known as: LIDODERM Place 2 patches onto the skin daily. Remove & Discard patch within 12 hours or as directed by MD Start taking on: January 17, 2020   methadone 10 MG tablet Commonly known as: DOLOPHINE Take 2 tablets (20 mg total) by mouth every 12 (twelve) hours.   multivitamin with minerals Tabs tablet Take 1 tablet by mouth daily.            Discharge Care Instructions  (From admission, onward)         Start     Ordered   01/16/20 0000  Discharge wound care:       Comments: Cleanse wound with gentle soap and water daily.  Apply a nonstick dressing such as a Telfa pad (if you can obtain a Vaseline gauze dressing you can use this instead).  Cover dressing with a dry dressing and wrapped in gauze   01/16/20 1052         Allergies  Allergen Reactions  . Bee Venom Anaphylaxis    Follow-up Information    Lightfoot, Eliezer Lofts, MD. Schedule an appointment as soon as possible for a visit in 4 week(s).   Specialty: Cardiothoracic Surgery Contact information: [address] [place] [address] [phone]                The results of significant diagnostics from this hospitalization (including imaging, microbiology, ancillary and laboratory) are listed below for reference.     Significant Diagnostic Studies: DG Chest 1 View  Result Date: 01/10/2020 CLINICAL DATA:  Chest tube removal. EXAM: CHEST  1 VIEW COMPARISON:  Same day. FINDINGS: Stable cardiomediastinal silhouette. Left-sided chest tube has been removed. No pneumothorax is noted. Right-sided PICC line is unchanged. Stable pleural thickening or loculated pleural effusion is noted laterally in left lung base. Stable other bilateral lung opacities are noted concerning for multifocal inflammation. Bony thorax is unremarkable. IMPRESSION: Stable pleural thickening or loculated pleural effusion laterally in left lung base.  Stable other bilateral lung opacities are noted concerning for multifocal inflammation. No pneumothorax is noted status post chest tube removal. Electronically Signed   By: Lupita Raider M.D.   On: 01/10/2020 15:35   DG Chest 2 View  Result Date: 01/09/2020 CLINICAL DATA:  Recent chest tube placement EXAM: CHEST - 2 VIEW COMPARISON:  January 08, 2020 chest radiograph; chest CT January 03, 2020 FINDINGS: Chest tube again noted on the left. No appreciable pneumothorax. Consolidation along the lateral aspect of the left lung appears stable. There is mild subcutaneous air on the left. Central catheter tip is in the superior vena cava near the cavoatrial junction. Patchy airspace opacity on the right remains with areas of subtle cavitation, better delineated on recent CT. There is atelectatic change in the medial left lower lung region, similar to 1 day prior. Heart size and pulmonary vascularity are normal. No adenopathy. No appreciable bone lesions. IMPRESSION: Tube and catheter positions are unchanged. No pneumothorax. Persistent airspace opacity in the area of chest tube in the periphery of the left mid and lower lung regions. Subtle areas of cavitation at multiple sites on the right with multiple foci of airspace opacity on the right. Atelectasis left base. No new opacity evident. Stable cardiac  silhouette. No adenopathy appreciable by radiography. Electronically Signed   By: Bretta Bang III M.D.   On: 01/09/2020 09:26   CT CHEST WO CONTRAST  Result Date: 01/09/2020 CLINICAL DATA:  History of LEFT hydropneumothorax status post LEFT chest tube placement on 01/05/2020. EXAM: CT CHEST WITHOUT CONTRAST TECHNIQUE: Multidetector CT imaging of the chest was performed following the standard protocol without IV contrast. COMPARISON:  Chest CT dated 01/03/2020. FINDINGS: Cardiovascular: Thoracic aorta is normal in caliber and configuration. No pericardial effusion. Mediastinum/Nodes: No mass or enlarged lymph nodes are seen within the mediastinum. Trachea and central bronchi are unremarkable. Lungs/Pleura: LEFT-sided chest tube in place. Chest tube is well positioned within the previously described complex collection of fluid and air in the LEFT pleural space. This component of the LEFT-sided pleural collection is nearly completely resolved. There remains complex loculated-appearing collections of fluid and air along the posterior and medial aspects of the LEFT lower lobe, measuring 4 cm and 5 cm greatest dimension respectively. The additional previous described multiple patchy/nodular opacities within the bilateral lungs, some cavitary, are not significantly changed in the short-term interval, compatible with atypical pneumonia versus septic emboli. Upper Abdomen: Limited images of the upper abdomen are unremarkable. Musculoskeletal: No acute appearing osseous abnormality IMPRESSION: 1. LEFT-sided chest tube is well positioned within the previously described complex collection of fluid and air in the LEFT pleural space. This component of the LEFT-sided pleural collection is nearly completely resolved. 2. There remains complex loculated-appearing collections of fluid and air along the posterior and medial aspects of the LEFT lower lobe, largest component at the medial aspect of the LEFT lung base measuring  5 cm greatest dimension. These may be secondary to bronchopleural fistula, as previously suggested. 3. Multiple patchy/nodular consolidations are again seen throughout the bilateral lungs, some cavitary, not significantly changed in the short-term interval, compatible with atypical pneumonia versus septic emboli. Favor septic emboli based on clinical data of drug abuse provided on previous exams. Electronically Signed   By: Bary Richard M.D.   On: 01/09/2020 17:51   CT CHEST W CONTRAST  Result Date: 01/03/2020 CLINICAL DATA:  Suspected loculated pneumothorax, evaluate for bronchopleural fistula EXAM: CT CHEST WITH CONTRAST TECHNIQUE: Multidetector CT imaging of the chest was  performed during intravenous contrast administration. CONTRAST:  64mL OMNIPAQUE IOHEXOL 300 MG/ML  SOLN COMPARISON:  Chest radiograph dated 01/03/2020. CT chest dated 12/16/2019 FINDINGS: Cardiovascular: The heart is normal in size. Trace inferior pericardial fluid. No evidence of thoracic aortic aneurysm. Right arm PICC terminates in the upper right atrium. Mediastinum/Nodes: Small mediastinal lymph nodes which do not meet pathologic CT size criteria. Visualized thyroid is unremarkable. Lungs/Pleura: Multiple patchy/nodular opacities in the lungs bilaterally, some of which are cavitary, improved when compared to the prior. For example, a 17 x 11 mm nodule in the posterior right upper lobe (series 4/image 35), previously measured 22 x 16 mm. A 19 x 9 mm nodule in the medial left upper lobe (series 4/image 62) previously measured 20 x 13 mm. This appearance continues to favor multifocal pneumonia on the basis of septic emboli. Moderate gas and fluid collection in the left pleural space, previously reflecting pleural fluid. Suspected direct communication via a dilated terminal bronchiole in the left anterior lingula (series 4/images 70-71), reflecting a bronchopleural fistula. Mild patchy opacities in the left lower lobe, atelectasis versus  pneumonia. Trace right pleural effusion. Upper Abdomen: Visualized upper abdomen is grossly unremarkable. Musculoskeletal: Visualized osseous structures are within normal limits. IMPRESSION: Improving multifocal pneumonia, likely on the basis of septic emboli. 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 Trace right pleural effusion. Electronically Signed   By: Charline Bills M.D.   On: 01/03/2020 13:08   MR Lumbar Spine W Wo Contrast  Result Date: 12/22/2019 CLINICAL DATA:  30 year old female with sepsis, MRSA bacteremia, endocarditis. Continued back pain, but no evidence of spinal infection on 12/08/2019 total spine MRI. EXAM: MRI LUMBAR SPINE WITHOUT AND WITH CONTRAST TECHNIQUE: Multiplanar and multiecho pulse sequences of the lumbar spine were obtained without and with intravenous contrast. CONTRAST:  43mL GADAVIST GADOBUTROL 1 MMOL/ML IV SOLN COMPARISON:  Total spine MRI 12/08/2019, and earlier. FINDINGS: Segmentation: Lumbar segmentation appears to be normal and will be designated as such for this report. Alignment:  Stable lumbar lordosis. No spondylolisthesis. Vertebrae: Generalized decreased T1 and T2 marrow signal in the visible spine and pelvis, probably due to red marrow reactivation. No marrow edema or evidence of acute osseous abnormality. Visible SI joints appear normal. Conus medullaris and cauda equina: Conus extends to the L1 level. No lower spinal cord or conus signal abnormality. No abnormal intradural enhancement. No dural thickening. Normal cauda equina nerve roots. Paraspinal and other soft tissues: Suggestion of anasarca, body wall edema. Negative visible abdominal viscera. No focal lumbar paraspinal soft tissue inflammation or enhancement. Disc levels: Intervertebral disc signal and morphology remains normal from T11-T12 through L5-S1. No significant degenerative changes. No spinal or foraminal stenosis. IMPRESSION: 1. Stable,  Negative MRI appearance of the Lumbar Spine aside from probable red marrow reactivation throughout the visible spine and pelvis. 2. Suggestion of generalized body wall edema, anasarca. Electronically Signed   By: Odessa Fleming M.D.   On: 12/22/2019 15:47   DG Chest Port 1 View  Result Date: 01/10/2020 CLINICAL DATA:  30 year old female with prior chest tube EXAM: PORTABLE CHEST 1 VIEW COMPARISON:  CT chest 01/09/2020, plain film 01/09/2020 FINDINGS: Cardiomediastinal silhouette unchanged in size and contour. Similar appearance focal nodular opacities of the lungs as well as pleuroparenchymal thickening along the left lateral pleura. Unchanged position of left pigtail thoracostomy with a slight kink at the chest wall and trance site. No pneumothorax. Unchanged right upper extremity PICC. IMPRESSION: Unchanged appearance of the  chest x-ray with persisting left pigtail thoracostomy tube and residual lateral pleural thickening/fluid. Unchanged right upper extremity PICC, and plain film appearance of known infectious lung nodules. Electronically Signed   By: Gilmer Mor D.O.   On: 01/10/2020 13:41   DG Chest Port 1 View  Result Date: 01/08/2020 CLINICAL DATA:  Evaluate pleural fluid and pneumothorax EXAM: PORTABLE CHEST 1 VIEW COMPARISON:  Five days ago FINDINGS: Chest drain on the left with no visible residual gas collection. There is adjacent pleural fluid/thickening along the lateral chest wall. Multiple pulmonary nodules, some cavitary (as along the paramediastinal left lower lobe). No apical pneumothorax. Stable heart size. Right PICC with tip at the upper cavoatrial junction. IMPRESSION: Improvement in fluid collection in the left chest after percutaneous drainage. No new abnormality. Electronically Signed   By: Marnee Spring M.D.   On: 01/08/2020 07:14   DG CHEST PORT 1 VIEW  Result Date: 01/03/2020 CLINICAL DATA:  Pleuritic chest pain.  History of drug overdose EXAM: PORTABLE CHEST 1 VIEW COMPARISON:   12/11/2019 FINDINGS: Right PICC with tip at the upper right atrium. Patchy pulmonary opacity with cavitary features as previously demonstrated. In place of loculated pleural fluid at the lateral left chest there is now an ovoid lucent area. Normal heart size. Probable splenomegaly IMPRESSION: Lucent area in the lateral left chest where there was previously loculated pleural fluid; suspect loculated pneumothorax. Consider chest CT to evaluate for bronchopleural fistula. Bilateral cavitary pneumonia which has likely improved. Electronically Signed   By: Marnee Spring M.D.   On: 01/03/2020 08:52   CT [facility] PLEURAL DRAIN W/INDWELL CATH W/IMG GUIDE  Result Date: 01/05/2020 INDICATION: 30 year old with known IV drug abuse and tricuspid MRSA endocarditis. 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 CT surgery recommended placement of a image guided chest tube. EXAM: CT-GUIDED CHEST TUBE PLACEMENT MEDICATIONS: Moderate sedation ANESTHESIA/SEDATION: Fentanyl 50 mcg IV; Versed 1.0 mg IV Moderate Sedation Time:  23 minutes The patient was continuously monitored during the procedure by the interventional radiology nurse under my direct supervision. COMPLICATIONS: None immediate. PROCEDURE: Informed written consent was obtained from the patient after a thorough discussion of the procedural risks, benefits and alternatives. All questions were addressed. Maximal Sterile Barrier Technique was utilized including caps, mask, sterile gowns, sterile gloves, sterile drape, hand hygiene and skin antiseptic. A timeout was performed prior to the initiation of the procedure. Patient was placed supine on the CT scanner with the left side mildly elevated. CT images through the chest were obtained. Left mid axillary region was prepped with chlorhexidine and sterile field was created. Skin and soft tissues were anesthetized using 1% lidocaine. Small incision was made. Using CT guidance, a Yueh  catheter was directed into the left pleural space. Gas was aspirated. A small amount of yellow fluid was aspirated. Superstiff Amplatz wire was advanced into the pleural space and the tract was dilated to accommodate a 14 [place] multipurpose drain. Catheter was attached to chest tube drainage system and attached to wall suction. Follow up CT images were obtained. Catheter was sutured to skin. FINDINGS: Again noted are numerous irregular opacities throughout both lungs and concerning for septic emboli. There is a large air-fluid collection in the left lower chest and similar to the recent CT on 01/03/2020. Hydropneumothorax was slightly smaller in size on the post drain images. Gas within the subcutaneous tissues on the post drain images. IMPRESSION: CT-guided placement of a chest tube within the left hydropneumothorax. Electronically Signed   By:  Richarda OverlieAdam  Henn M.D.   On: 01/05/2020 18:46    Microbiology: Recent Results (from the past 240 hour(s))  MRSA PCR Screening     Status: None   Collection Time: 01/14/20 12:01 PM   Specimen: Nasal Mucosa; Nasopharyngeal  Result Value Ref Range Status   MRSA by PCR NEGATIVE NEGATIVE Final    Comment:        The GeneXpert MRSA Assay (FDA approved for NASAL specimens only), is one component of a comprehensive MRSA colonization surveillance program. It is not intended to diagnose MRSA infection nor to guide or monitor treatment for MRSA infections. Performed at [facility], [address] [place], [place] [address]      Labs: Basic Metabolic Panel: Recent Labs  Lab 01/15/20 0404  NA 133*  K 4.2  CL 98  CO2 28  GLUCOSE 92  BUN 11  CREATININE 0.43*  CALCIUM 9.1   Liver Function Tests: No results for input(s): AST, ALT, ALKPHOS, BILITOT, PROT, ALBUMIN in the last 168 hours. No results for input(s): LIPASE, AMYLASE in the last 168 hours. No results for input(s): AMMONIA in the last 168 hours. CBC: Recent Labs  Lab 01/15/20 0404  WBC  8.1  NEUTROABS 4.9  HGB 9.2*  HCT 30.2*  MCV 86.0  PLT 254   Cardiac Enzymes: Recent Labs  Lab 01/11/20 1126 01/12/20 0346  CKTOTAL 23* 16*   BNP: BNP (last 3 results) Recent Labs    11/30/19 1809  BNP 583.0*    ProBNP (last 3 results) No results for input(s): PROBNP in the last 8760 hours.  CBG: No results for input(s): GLUCAP in the last 168 hours.     Signed:  Junious Silkllison Tevon Berhane ANP [facility] 01/16/2020, 10:54 AM 47 days

## 2020-01-16 NOTE — Progress Notes (Addendum)
[name] to be D/C'd  per MD order. Discussed with the patient and all questions fully answered.  V/S stable. Patient sent home with wound care of LUE  instructions and supply via mother. Education via teach back provided and efficient. Walker delivered to bedside. Medications delivered to bedside by [facility] pharmacy.    An After Visit Summary was printed and given to the patient. Patient received prescription.  D/c education completed with patient/family including follow up instructions, medication list, d/c activities limitations if indicated, with other d/c instructions as indicated by MD - patient able to verbalize understanding, all questions fully answered.   Patient instructed to return to ED, call 911, or call MD for any changes in condition.   Patient to be escorted via WC, and D/C home via private auto.

## 2020-01-16 NOTE — Progress Notes (Signed)
VAST consulted to remove PICC for discharge.  Upon arrival at pt's room, unit RN stated pt needs another dose of IV antibiotics prior to PICC dc. Unit RN to place IV team consult once IV antibiotic infusion complete and PICC will be removed for discharge after that time.

## 2020-01-16 NOTE — Progress Notes (Signed)
PT Cancellation Note  Patient Details Name: Amber [name] MRN: [id number] DOB: [birth year]   Cancelled Treatment:    Reason Eval/Treat Not Completed: Other (comment).  Pt is leaving today per her report and declined therapy.  Will re-attempt at another time as inpt stay permits.   [name] 01/16/2020, 3:25 PM   [name], PT MS Acute Rehab Dept. Number: [facility] [id number] and [facility] [phone]

## 2020-01-16 NOTE — TOC Initial Note (Signed)
Transition of Care [facility]) - Initial/Assessment Note    Patient Details  Name: Amber [name] MRN: [id number] Date of Birth: 1990/10/03  Transition [facility]) CM/SW Contact:    Janae Bridgeman, RN Phone Number: 01/16/2020, 11:41 AM  Clinical Narrative:                 Case management spoke with the patient's mother on the phone regarding transitions of care to home today.  The patient plans to discharge home with her mom to [address], [place], [place] [address].  The patient's mother is able to provide transportation for the patient so the patient was set up with Outpatient PT referral to [facility] and the mother is aware.    I called and spoke with [employer] and ordered a rolling walker to have it delivered to the patient's room prior to discharge.  TOC is providing discharge medications to the patient and the mother is able to pay the existing copay.  The patient will be able to discharge to home once her TOC medications are delivered and the RW from [employer] arrives to the hospital room.  Expected Discharge Plan: OP Rehab Barriers to Discharge: No Barriers Identified   Patient Goals and CMS Choice Patient states their goals for this hospitalization and ongoing recovery are:: Patient plans to discharge home with her mom today. CMS Medicare.gov Compare Post Acute Care list provided to:: Patient Choice offered to / list presented to : Patient  Expected Discharge Plan and Services Expected Discharge Plan: OP Rehab   Discharge Planning Services: CM Consult Post Acute Care Choice: Durable Medical Equipment Living arrangements for the past 2 months: Single Family Home Expected Discharge Date: 01/16/20               DME Arranged: Dan Humphreys rolling DME Agency: [employer] Date DME Agency Contacted: 01/16/20 Time DME Agency Contacted: [phone] Representative spoke with at DME Agency: Leeroy Bock, CM with [employer]            Prior Living Arrangements/Services Living  arrangements for the past 2 months: Single Family Home Lives with:: Parents Patient language and need for interpreter reviewed:: Yes Do you feel safe going back to the place where you live?: Yes      Need for Family Participation in Patient Care: Yes (Comment) Care giver support system in place?: Yes (comment)   Criminal Activity/Legal Involvement Pertinent to Current Situation/Hospitalization: No - Comment as needed  Activities of Daily Living Home Assistive Devices/Equipment: None ADL Screening (condition at time of admission) Patient's cognitive ability adequate to safely complete daily activities?: Yes Is the patient deaf or have difficulty hearing?: No Does the patient have difficulty seeing, even when wearing glasses/contacts?: No Does the patient have difficulty concentrating, remembering, or making decisions?: No Patient able to express need for assistance with ADLs?: Yes Does the patient have difficulty dressing or bathing?: No Independently performs ADLs?: Yes (appropriate for developmental age) Does the patient have difficulty walking or climbing stairs?: No Weakness of Legs: Both Weakness of Arms/Hands: None  Permission Sought/Granted Permission sought to share information with : Case Manager Permission granted to share information with : Yes, Verbal Permission Granted     Permission granted to share info w AGENCY: OP Rehab  Permission granted to share info w Relationship: patient's mom     Emotional Assessment Appearance:: Appears stated age Attitude/Demeanor/Rapport: Engaged Affect (typically observed): Accepting Orientation: : Oriented to Self,Oriented to Place,Oriented to  Time,Oriented to Situation Alcohol /  Substance Use: Illicit Drugs Psych Involvement: No (comment)  Admission diagnosis:  Septic shock (HCC) [A41.9, R65.21] Patient Active Problem List   Diagnosis Date Noted  . IV infiltrate, subsequent encounter 01/16/2020  . Septic pulmonary embolism  without acute cor pulmonale (HCC) 01/16/2020  . Hydropneumothorax 01/16/2020  . Severe tricuspid regurgitation 01/16/2020  . GAD (generalized anxiety disorder) 01/16/2020  . Hepatitis C, chronic (HCC) 01/16/2020  . Anemia, chronic disease 01/16/2020  . Protein-calorie malnutrition, severe 12/27/2019  . MRSA infection 12/06/2019  . Opioid use with withdrawal (HCC)   . Endocarditis of tricuspid valve   . Palliative care encounter   . Septic shock (HCC) 11/30/2019  . Septic embolism (HCC) 11/05/2019  . AKI (acute kidney injury) (HCC) 11/05/2019  . Hyponatremia 11/05/2019  . Cellulitis of left foot 11/05/2019  . 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 Bacterial endocarditis 11/05/2019  . Acute bacterial endocarditis   . MRSA bacteremia   . Sepsis (HCC) 11/03/2019  . Abscess of face 08/20/2018  . Facial cellulitis 11/21/2017  . Depression   . IV drug abuse (HCC)   . Suicidal ideation    PCP:  Patient, No Pcp Per Pharmacy:   [facility] [address], [place] - [address] [place] [address] Phone: [phone] Fax: [phone]  [facility] [place], [place] - [address] [place] [address] Phone: [phone] Fax: [phone]     Social Determinants of Health (SDOH) Interventions    Readmission Risk Interventions Readmission Risk Prevention Plan 01/16/2020  Transportation Screening Complete  PCP or Specialist Appt within 3-5 Days Complete  HRI or Home Care Consult Complete  Social Work Consult for Recovery Care Planning/Counseling Complete  Palliative Care Screening Complete  Medication Review Oceanographer) Complete  Some recent data might be hidden

## 2020-01-16 NOTE — Progress Notes (Signed)
RA SL PICC removed per protocol per MD order. Manual pressure applied for 5 mins. No bleeding or swelling noted. Instructed patient to remain in bed for thirty mins. Educated patient about S/S of infection and when to call MD; no heavy lifting or pressure on right side for 24 hours; keep dressing dry and intact for 24 hours. Pt verbalized comprehension. 

## 2020-01-18 LAB — ACID FAST CULTURE WITH REFLEXED SENSITIVITIES (MYCOBACTERIA): Acid Fast Culture: NEGATIVE

## 2020-01-25 ENCOUNTER — Inpatient Hospital Stay: Payer: Medicaid Other | Admitting: Infectious Diseases

## 2020-01-25 ENCOUNTER — Ambulatory Visit (HOSPITAL_COMMUNITY): Payer: Medicaid Other | Attending: Nurse Practitioner | Admitting: Physical Therapy

## 2020-01-26 ENCOUNTER — Ambulatory Visit (INDEPENDENT_AMBULATORY_CARE_PROVIDER_SITE_OTHER): Payer: Self-pay | Admitting: Infectious Diseases

## 2020-01-26 ENCOUNTER — Encounter: Payer: Self-pay | Admitting: Infectious Diseases

## 2020-01-26 ENCOUNTER — Other Ambulatory Visit: Payer: Self-pay

## 2020-01-26 VITALS — BP 106/73 | HR 103 | Wt 99.0 lb

## 2020-01-26 DIAGNOSIS — I079 Rheumatic tricuspid valve disease, unspecified: Secondary | ICD-10-CM

## 2020-01-26 DIAGNOSIS — B182 Chronic viral hepatitis C: Secondary | ICD-10-CM

## 2020-01-26 DIAGNOSIS — Z7185 Encounter for immunization safety counseling: Secondary | ICD-10-CM | POA: Insufficient documentation

## 2020-01-26 DIAGNOSIS — F1910 Other psychoactive substance abuse, uncomplicated: Secondary | ICD-10-CM

## 2020-01-26 NOTE — Assessment & Plan Note (Signed)
Discussed about COVID vaccine Check for serology for Hepatitis A and B and vaccinate if non immune

## 2020-01-26 NOTE — Progress Notes (Addendum)
[facility]                                      [address], [place], [place], [address]                                               Phn. [phone]; Fax: [phone]                                                               Date:  Reason for Visit: Hepatitis C    HPI: Amber [name] is a 30 y.o.old female with PMH of IV drug use and Hepatitis C who is referred for evaluation and management of Hepatitis C. Patient was tested positive for HCV ab in 11/04/19. She has a h/o extensive IV drug use. She says she used to do IV heroin /methamphetamine almost every day for last 9 years but has been clean for last 50 days. She was recently admitted in the hospital 11/30/19-01/16/20 for MRSA bacteremia with septic pulmonary emboli, TV endocarditis s/p angiovac. She completed treatment with IV abx on 01/16/20 and was discharged on the same day.  She says he has been working with PT for her recovery. She is living with her mother. Denies using any IVD for last 50 days.  Smokes a pack for a week and half , no alcohol.   Her boyfriend also has  Hepatitis C, denies sharing of toothbrushes/razors. Denies personal or family history of liver disease.  She has not received treatment to date.  Denies yellowish discoloration of sclera and skin, abdominal pain/distension, hematemesis. Denies malena, joint pains or rashes   ROS: Denies cough, fever, chills, nightsweats, nausea, vomiting, diarrhea, constipation, weight loss, recent hospitalizations, shortness of breath, chest pain, headaches, dysuria .  Current Outpatient Medications on File Prior to Visit  Medication Sig Dispense Refill  . acetaminophen (TYLENOL) 500 MG tablet Take 1,000 mg by mouth every 6 (six) hours as needed for headache.     . diclofenac Sodium (VOLTAREN) 1 % GEL Apply 4 g topically 4 (four) times daily. 4 g 6  . escitalopram (LEXAPRO) 20 MG tablet Take 1 tablet  (20 mg total) by mouth at bedtime. 30 tablet 6  . gabapentin (NEURONTIN) 600 MG tablet Take 0.5 tablets (300 mg total) by mouth 3 (three) times daily. 90 tablet 6  . ibuprofen (ADVIL) 600 MG tablet Take 1 tablet (600 mg total) by mouth every 6 (six) hours as needed. 30 tablet 0  . methadone (DOLOPHINE) 10 MG tablet Take 2 tablets (20 mg total) by mouth every 12 (twelve) hours. 30 tablet 0  . Multiple Vitamin (MULTIVITAMIN WITH MINERALS) TABS tablet Take 1 tablet by mouth daily. 30 tablet 6  . lidocaine (LIDODERM) 5 % Place 2 patches onto the skin daily. Remove & Discard patch within 12 hours or as directed by MD 30 patch 0   No current facility-administered medications on file prior to visit.     Allergies  Allergen Reactions  . Bee Venom Anaphylaxis  Past Medical History:  Diagnosis Date  . Heroin abuse (HCC)   . Kidney stones   . MRSA (methicillin resistant staph aureus) culture positive   . Ovarian cyst   . UTI (lower urinary tract infection)    Past Surgical History:  Procedure Laterality Date  . APPLICATION OF ANGIOVAC N/A 12/05/2019   Procedure: APPLICATION OF ANGIOVAC;  Surgeon: Corliss Skains, MD;  Location: [facility] OR;  Service: Vascular;  Laterality: N/A;  Patient will need intraoperative TEE Can be performed in OR 14, 15, or 17 if OR 16 is not available  . FINGER SURGERY    . TEE WITHOUT CARDIOVERSION N/A 11/07/2019   Procedure: TRANSESOPHAGEAL ECHOCARDIOGRAM (TEE);  Surgeon: Wendall Stade, MD;  Location: [facility] ENDOSCOPY;  Service: Cardiovascular;  Laterality: N/A;  . TEE WITHOUT CARDIOVERSION  12/05/2019   Procedure: TRANSESOPHAGEAL ECHOCARDIOGRAM (TEE);  Surgeon: Corliss Skains, MD;  Location: [facility] OR;  Service: Vascular;;  . TEE WITHOUT CARDIOVERSION N/A 12/05/2019   Procedure: TRANSESOPHAGEAL ECHOCARDIOGRAM (TEE);  Surgeon: Corliss Skains, MD;  Location: [facility] OR;  Service: Thoracic;  Laterality: N/A;   Social History   Socioeconomic History  . Marital  status: Single    Spouse name: Not on file  . Number of children: Not on file  . Years of education: Not on file  . Highest education level: Not on file  Occupational History  . Not on file  Tobacco Use  . Smoking status: Former Smoker    Types: Cigarettes  . Smokeless tobacco: Never Used  Vaping Use  . Vaping Use: Never used  Substance and Sexual Activity  . Alcohol use: No  . Drug use: Yes    Types: Marijuana, IV    Comment: opiods- pain medication/heroin  . Sexual activity: Yes    Birth control/protection: None  Other Topics Concern  . Not on file  Social History Narrative  . Not on file   Social Determinants of Health   Financial Resource Strain: Not on file  Food Insecurity: Not on file  Transportation Needs: Not on file  Physical Activity: Not on file  Stress: Not on file  Social Connections: Not on file  Intimate Partner Violence: Not on file   Vitals  BP 106/73   Pulse (!) 103   Wt 99 lb (44.9 kg)   LMP  (LMP Unknown)   BMI 18.71 kg/m   Gen: Alert and oriented x 3, no acute distress HEENT: [place]/AT, PERL, no scleral icterus, no pale conjunctivae, hearing normal, oral mucosa moist, NO ORAL THRUSH Neck: Supple, no lymphadenopathy Cardio: Regular rate and rhythm; +S1 and S2; no murmurs, gallops, or rubs Resp: CTAB; no wheezes, rhonchi, or rales GI: Soft, nontender, nondistended, bowel sounds present GU: Musc: Extremities: No cyanosis, clubbing, or edema; +2 PT and DP pulses Skin: No rashes, lesions, or ecchymoses, TATTOOS IN THE LOWER ABDOMEN, BACK+ Neuro: No focal deficits Psych: Calm, cooperative   Laboratory  CBC Latest Ref Rng & Units 01/15/2020 01/09/2020 01/04/2020  WBC 4.0 - 10.5 K/uL 8.1 7.6 11.4(H)  Hemoglobin 12.0 - 15.0 g/dL 3.5(H) 2.9(J) 2.4(Q)  Hematocrit 36.0 - 46.0 % 30.2(L) 31.8(L) 29.4(L)  Platelets 150 - 400 K/uL 254 289 224   CMP Latest Ref Rng & Units 01/15/2020 01/09/2020 01/04/2020  Glucose 70 - 99 mg/dL 92 683(M) 87  BUN 6 - 20  mg/dL 11 7 10   Creatinine 0.44 - 1.00 mg/dL ) 1.96(Q 2.29  Sodium 135 - 145 mmol/L 133(L) 135 133(L)  Potassium 3.5 -  5.1 mmol/L 4.2 4.0 3.7  Chloride 98 - 111 mmol/L 98 100 97(L)  CO2 22 - 32 mmol/L 28 27 28   Calcium 8.9 - 10.3 mg/dL 9.1 ) 2.1(R)  Total Protein 6.5 - 8.1 g/dL - - -  Total Bilirubin 0.3 - 1.2 mg/dL - - -  Alkaline Phos 38 - 126 U/L - - -  AST 15 - 41 U/L - - -  ALT 0 - 44 U/L - - -    Assessment/Plan: 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 Prior treatment: none GT: 1a Evidence of cirrhosis: Fibrosure and 1.7(B elastography ordered Interested in treatment: yes  Substance use Counseled   MSRA bacteremia with septic emboli Native TV Endocarditis s/p angiovac S/p completion of IV abx on 01/16/20 TTE 12/12/19 Severe TVR Instructed to follow up with CT Sx Dr 12/14/19 foot as planned before for follow on TV replacement   Immunization Counseling Discussed about COVID vaccination   Counseling done on the following -Natural progression of hep c, transmission (avoid sharing personal hygiene equipment), prevention, risks of left untreated and treatment options  -Avoid hepatotoxins like alcohol and excessive acetamaminphen (no more than 2 gram a day) -Avoid eating raw sea food -Risks of re-infection  -Hepatitis coinfection and vaccination  I spent greater approx 60 minutes for this visit including review of prior medical records with greater than 50% of time in face to face counsel of the patient.   Patient's labs were reviewed as well as his previous records. Patients questions were addressed and answered.   Electronically signed by:  Sherlynn Stalls, MD Infectious Diseases  Office phone [phone] Fax no. [phone]

## 2020-01-26 NOTE — Assessment & Plan Note (Signed)
S/p angiovac and completion of IV abx on 01/16/20 Instructed to follow up with CT Sx Dr Cliffton Asters

## 2020-01-26 NOTE — Assessment & Plan Note (Signed)
Will get more labs for planning treatment of Hep C + [name] elastography  Fu in 4 weeks

## 2020-01-26 NOTE — Assessment & Plan Note (Signed)
Counseled

## 2020-01-31 LAB — LIVER FIBROSIS, FIBROTEST-ACTITEST
ALT: 20 U/L (ref 6–29)
Alpha-2-Macroglobulin: 166 mg/dL (ref 106–279)
Apolipoprotein A1: 111 mg/dL (ref 101–198)
Bilirubin: 0.6 mg/dL (ref 0.2–1.2)
Fibrosis Score: 0.17
GGT: 118 U/L — ABNORMAL HIGH (ref 3–50)
Haptoglobin: 217 mg/dL — ABNORMAL HIGH (ref 43–212)
Necroinflammat ACT Score: 0.07
Reference ID: 3700984

## 2020-01-31 LAB — HEPATITIS B CORE ANTIBODY, TOTAL: Hep B Core Total Ab: NONREACTIVE

## 2020-01-31 LAB — HEPATITIS A ANTIBODY, TOTAL: Hepatitis A AB,Total: REACTIVE — AB

## 2020-01-31 LAB — RPR: RPR Ser Ql: REACTIVE — AB

## 2020-01-31 LAB — HEPATITIS B SURFACE ANTIBODY,QUALITATIVE: Hep B S Ab: REACTIVE — AB

## 2020-01-31 LAB — HIV ANTIBODY (ROUTINE TESTING W REFLEX): HIV 1&2 Ab, 4th Generation: NONREACTIVE

## 2020-01-31 LAB — FLUORESCENT TREPONEMAL AB(FTA)-IGG-BLD: Fluorescent Treponemal ABS: NONREACTIVE

## 2020-01-31 LAB — RPR TITER: RPR Titer: 1:1 {titer} — ABNORMAL HIGH

## 2020-02-01 ENCOUNTER — Ambulatory Visit (HOSPITAL_COMMUNITY): Payer: Self-pay

## 2020-02-02 NOTE — Progress Notes (Signed)
[facility] PROGRESS NOTE    GAE BIHL KGM:[id number] DOB: 1990-06-14 DOA: 11/30/2019 PCP: Patient, No Pcp Per     12/15  Status: Remains inpatient appropriate because:Ongoing active pain requiring inpatient pain management, Ongoing diagnostic testing needed not appropriate for outpatient work up, Unsafe d/c plan, IV treatments appropriate due to intensity of illness or inability to take PO and Inpatient level of care appropriate due to severity of illness   Dispo: The patient is from: Home              Anticipated d/c is to: Home              Anticipated d/c date is: > 3 days (01/16/20)              Patient currently is not medically stable to d/c.  Patient's primary reason for remaining in the hospital is to complete 6 weeks of IV and oral antibiotics for her endocarditis with septic emboli.  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  Discharge medications have been sent to the [facility] pharmacy including methadone.  Code Status: Full Family Communication: Patient; mother 12/28 DVT prophylaxis: SCDs 2/2 recent thrombocytopenia due to sepsis Vaccination status: Has not been vaccinated against Covid  Foley catheter: No  HPI: 30 year old female patient with known IV drug abuse with known tricuspid MRSA endocarditis who signed out AMA x2 prior to this admission.  She presented with progressive infection.  On 10/21 through 10/23 she was admitted with fever with blood cultures positive for MRSA.  CT of the chest demonstrated septic emboli and edematous L4-5 facet joint (? Evolving osteomyelitis) as well as tricuspid vegetation on CT.  She left AMA.  She returned 2 hours later on 10/23 with severe back pain.  Underwent TEE that demonstrated a 1 cm tricuspid valve mass.  She was evaluated by CT surgery who recommended medical management.   Again she left AMA on 10/25.  Patient was staying with friends but presented back to the hospital due to increasing shortness of breath, weakness, fatigue malaise and nausea.  She arrived in the parking lot obtunded and was found to have multiple needles in her car on 11/17.  In the ER she was hypotensive and started on pressors.  Hemoglobin was low and she was given a blood transfusion.  She was also started on empiric IV vancomycin for known MRSA infection  and was admitted to the critical care service.  She eventually was taken to the OR for angio VAC during this admission.  She has subsequently transitioned out of ICU to stepdown unit.  She has had an IV infiltration and has a superficial wound on her left forearm. ID continues to follow regarding the endocarditis and recommends a total of 6 weeks therapy be daptomycin.  She has continued to have hypoxemia and productive cough and on 11/30 ID added doxycycline.  She is reporting pleuritic chest pain as well.  She is also very malnourished and was started on tube feedings and developed mild electrolyte disturbances from refeeding syndrome which have resolved.  She has low-grade resting tachycardia with no significant abnormalities on echo and cardiology previously did not suspect cardiac etiology to her tachycardia.  Hepatitis C antibody has been positive; she is HIV negative.  She has had persistent elevation in her LFTs.  She is currently on methadone 20 mg 3 times a day  but will need to be on no more than 40 mg per 24 hours at time of discharge to be eligible for outpatient methadone treatment.  Subjective: States LUE in left lateral rib cage pain markedly improved.  No specific complaints or requests.  Objective: Vitals:   01/16/20 0435 01/16/20 1300  BP: 109/66 114/83  Pulse: 92 96  Resp: 17 18  Temp: 97.9 F (36.6 C) 98.2 F (36.8 C)  SpO2: 100% 100%   No intake or output data in the 24 hours ending 02/02/20 1749 Filed Weights   12/31/19  0647 01/02/20 0500 01/04/20 0624  Weight: 41.4 kg 41.4 kg 42.1 kg    Exam: General: Alert, in no acute distress, calm watching TV Pulmonary: Room air saturations stable between 96 to 98%.  Bilateral lung sounds are clear to auscultation. Cardiac: Heart sounds S1-S2 without murmurs including over tricuspid area, no peripheral edema.  Pulses regular and nontachycardic at rest Abdomen: Soft nontender nondistended with normoactive bowel sounds auscultated.Marland KitchenLBM 12/30 Extremities: Symmetrical, LUE dressing clean dry and intact    Assessment/Plan: Acute problems: Septic shock 2/2 MRSA bacteremia and associated TV endocarditis status post angio VAC/abnormal lumbar imaging -ID commended 42 days of IV daptomycin via PICC inserted 11/30 (last dose should be 01/16/20)-CK stable -ID following  -Repeat echo 11/29 shows resolution of endocarditis but persistent severe tricuspid regurgitation -Repeat lumbar MRI revealed no evidence of infection in lumbar spine or other significant abnormality  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 Lightfoot's assistance.  -Pigtail catheter to close drainage system placed on 12/23 -Chest tube removed on 12/28 with follow-up chest x-ray unremarkable -Short acting Oxy decreased from 15 mg to 10 mg on 12/28.  On 12/30 will decrease to 5 mg and continue for 2 days then discontinue noting patient currently on chronic methadone.  Acute hypoxemic respiratory failure (multifactorial) 1 acute sepsis 2 septic emboli with sequela of small pleural effusion -Sepsis physiology and hypoxemia resolved -Continue oral doxycycline for sequela from septic pulmonary emboli-will stop at discharge -Chest x-ray 12/21 revealed loculated pleural fluid possibly loculated pneumothorax-CT of the chest confirmed bronchopleural fistula -Encourage use of  incentive spirometry  Persistent tachycardia/tricuspid valve insufficiency -Evaluated by CVTS and currently not a surgical candidate for cardiac valve replacement giving active IVDA prior to current admission (although was appropriate for angiovac surgery) -Echocardiogram: mild systolic LV dysfunction -cardiology felt this was  physiologic response to hypoalbuminemia/malnutrition, deconditioning and anemia. -Cardiology recommended to suppress physiologic tachycardia with beta-blockers nor utilize diuretics prophylactically due to increased risk of harm   Severe protein calorie malnutrition with associated refeeding syndrome -Initial BMI 17 patient briefly required tube feedings which have now been discontinued. -BMI continues to trend upward on regular diet with supplementation as below Ensure feeding supplements Nutrition Problem: Severe Malnutrition Etiology: acute illness (MRSA endocarditis) Signs/Symptoms: energy intake < or equal to 50% for > or equal to 5 days,percent weight loss (8.5% weight loss in less than 2 months) Percent weight loss: 8.5 % Interventions: Ensure Enlive (each supplement provides 350kcal and 20 grams of protein),Magic cup,MVI Estimated body mass index is 17.54 kg/m as calculated from the following:   Height as of this encounter: 5\' 1"  (1.549 m).   Weight as of this encounter: 42.1 kg.  Skin wound secondary to IV infiltration/right groin wound/right neck incision     11/30/19  12/20/19                         12/30/19  -ID also following along regarding this wound.  No indication to change current management and no indication to consult surgery -Dark eschar fell off on 12/10 as evidenced by pictures above wound continues to improve -Continue gabapentin 300 mg TID  Wound / Incision (Open or Dehisced) 12/10/19 Non-pressure wound Arm Left;Posterior;Lateral infiltration/phlebitis (Active)  Date First Assessed/Time First Assessed: 12/10/19 1915    Wound Type: Non-pressure wound  Location: Arm  Location Orientation: Left;Posterior;Lateral  Wound Description (Comments): infiltration/phlebitis    Assessments 12/10/2019 11:45 PM 01/16/2020 10:15 AM  Dressing Type Thin film;Non adherent;Gauze (Comment) Impregnated gauze (petrolatum);Gauze (Comment)  Dressing Changed New Changed  Dressing Status Intact Clean;Intact;New drainage  Dressing Change Frequency PRN Daily  Site / Wound Assessment Painful;Red;Bleeding --  Closure None --  Drainage Amount Minimal Scant  Drainage Description Serosanguineous Purulent  Treatment Other (Comment) --     No Linked orders to display    Chronic IV drug abuse with opiates/recent withdrawal syndrome -12/13: Discussed with patient that plan is to decrease to discharge dose of methadone 40 mg daily by next Monday 12/20 -Pam has been decreased to 0.25 mg BID and dose will be decreased to once daily today  GAD -As noted above weaning and decreasing benzodiazepine -Discussed with patient's mother and as I have also noted patient has significant anxiety issues -12/10: Lexapro 10 mg HS initiated and as of 12/24 will increase to 20 mg -Klonopin discontinued today 12/29      Other problems: Anemia of chronic disease/recent sepsis related thrombocytopenia -Thrombocytopenia has resolved -Hemoglobin stable around 8 -With initiation of NSAIDs daily Protonix has been started  Hyponatremia -Current sodium stable at 133  Hep C antibody positive with persistent transaminitis -Currently has nonobstructive transaminitis that has improved with improvement in nutrition -As of 12/3 albumin 2.0, AST 52, ALT normal at 42 and total bilirubin has been normal -HCVRNA quantitative pending  LBP -Musculoskeletal in etiology noting no significant abnormalities on recent MRI -Continue supportive care with ibuprofen prn and lidocaine patch   Data Reviewed: Basic Metabolic Panel: No results for input(s): NA, K, CL,  CO2, GLUCOSE, BUN, CREATININE, CALCIUM, MG, PHOS in the last 168 hours. Liver Function Tests: No results for input(s): AST, ALT, ALKPHOS, BILITOT, PROT, ALBUMIN in the last 168 hours. No results for input(s): LIPASE, AMYLASE in the last 168 hours. No results for input(s): AMMONIA in the last 168 hours. CBC: No results for input(s): WBC, NEUTROABS, HGB, HCT, MCV, PLT in the last 168 hours. Cardiac Enzymes: No results for input(s): CKTOTAL, CKMB, CKMBINDEX, TROPONINI in the last 168 hours. BNP (last 3 results) Recent Labs    11/30/19 1809  BNP 583.0*    ProBNP (last 3 results) No results for input(s): PROBNP in the last 8760 hours.  CBG: No results for input(s): GLUCAP in the last 168 hours.  No results found for this or any previous visit (from the past 240 hour(s)).   Studies: No results found.  Scheduled Meds:  Continuous Infusions:   Principal Problem:   MRSA infection Active Problems:   Septic shock (HCC)   Opioid use with withdrawal (HCC)   Endocarditis of tricuspid valve   Palliative care encounter   Protein-calorie malnutrition, severe   IV infiltrate, subsequent encounter   Septic pulmonary embolism without acute cor pulmonale (HCC)   Hydropneumothorax   Severe tricuspid regurgitation  GAD (generalized anxiety disorder)   Hepatitis C, chronic (HCC)   Anemia, chronic disease   Consultants:  PCCM  Infectious disease  CVTS  Cardiology  Palliative medicine  Procedures:  10/22 echocardiogram  10/24 TEE  11/17 complete echocardiogram  11/22 intraoperative TEE/application of angio [facility]  11/24 core track  11/29 Limited echocardiogram  Antibiotics: Anti-infectives (From admission, onward)   Start     Dose/Rate Route Frequency Ordered Stop   01/16/20 1130  DAPTOmycin (CUBICIN) 500 mg in sodium chloride 0.9 % IVPB        500 mg 220 mL/hr over 30 Minutes Intravenous  Once 01/16/20 1124 01/16/20 1352   12/13/19 1115  doxycycline (VIBRA-TABS)  tablet 100 mg  Status:  Discontinued        100 mg Oral Every 12 hours 12/13/19 1027 01/16/20 2221   12/07/19 2000  DAPTOmycin (CUBICIN) 500 mg in sodium chloride 0.9 % IVPB  Status:  Discontinued        500 mg 220 mL/hr over 30 Minutes Intravenous Daily 12/06/19 1345 01/16/20 1124   12/02/19 1400  vancomycin (VANCOREADY) IVPB 500 mg/100 mL  Status:  Discontinued        500 mg 100 mL/hr over 60 Minutes Intravenous Every 8 hours 12/02/19 1127 12/02/19 1131   12/02/19 1400  vancomycin (VANCOREADY) IVPB 750 mg/150 mL  Status:  Discontinued        750 mg 150 mL/hr over 60 Minutes Intravenous Every 8 hours 12/02/19 1131 12/06/19 1339   12/01/19 1200  vancomycin (VANCOREADY) IVPB 500 mg/100 mL  Status:  Discontinued        500 mg 100 mL/hr over 60 Minutes Intravenous Every 24 hours 11/30/19 1454 12/01/19 0946   12/01/19 1200  vancomycin (VANCOCIN) IVPB 1000 mg/200 mL premix  Status:  Discontinued        1,000 mg 200 mL/hr over 60 Minutes Intravenous Every 24 hours 12/01/19 0946 12/02/19 1127   11/30/19 1800  piperacillin-tazobactam (ZOSYN) IVPB 3.375 g  Status:  Discontinued        3.375 g 12.5 mL/hr over 240 Minutes Intravenous Every 8 hours 11/30/19 1454 12/02/19 0234   11/30/19 1115  piperacillin-tazobactam (ZOSYN) IVPB 3.375 g        3.375 g 100 mL/hr over 30 Minutes Intravenous  Once 11/30/19 1110 11/30/19 1306   11/30/19 1115  vancomycin (VANCOCIN) IVPB 1000 mg/200 mL premix        1,000 mg 200 mL/hr over 60 Minutes Intravenous  Once 11/30/19 1110 11/30/19 1337       Time spent: 32 minutes    Tom Macpherson , DO  [facility]  47 days

## 2020-02-08 ENCOUNTER — Other Ambulatory Visit: Payer: Self-pay

## 2020-02-08 ENCOUNTER — Ambulatory Visit (HOSPITAL_COMMUNITY)
Admission: RE | Admit: 2020-02-08 | Discharge: 2020-02-08 | Disposition: A | Discharge status: Home / Self Care | Payer: Self-pay | Source: Ambulatory Visit | Attending: Infectious Diseases | Admitting: Infectious Diseases

## 2020-02-08 DIAGNOSIS — B182 Chronic viral hepatitis C: Secondary | ICD-10-CM | POA: Insufficient documentation

## 2020-02-08 IMAGING — US US ABDOMEN COMPLETE W/ ELASTOGRAPHY
1 series · 12 of 25 positions shown · non-contrast
Comparison: [DATE] abdominal ultrasound.  CT [DATE].

CLINICAL DATA: Hepatitis C.

EXAM:
ULTRASOUND ABDOMEN
ULTRASOUND HEPATIC ELASTOGRAPHY
TECHNIQUE: Sonography of the upper abdomen was performed. In addition,
ultrasound elastography evaluation of the liver was performed. A
region of interest was placed within the right lobe of the liver.
Following application of a compressive sonographic pulse, tissue
compressibility was assessed. Multiple assessments were performed at
the selected site. Median tissue compressibility was determined.
Previously, hepatic stiffness was assessed by shear wave velocity.
Based on recently published Society of Radiologists in Ultrasound
consensus article, reporting is now recommended to be performed in
the SI units of pressure (kiloPascals) representing hepatic
stiffness/elasticity. The obtained result is compared to the
published reference standards. (cACLD = compensated Advanced Chronic
Liver Disease)

[Series 1: us abdomen complete w/elastography · 12 of 94 slices shown]
[im 4/94]
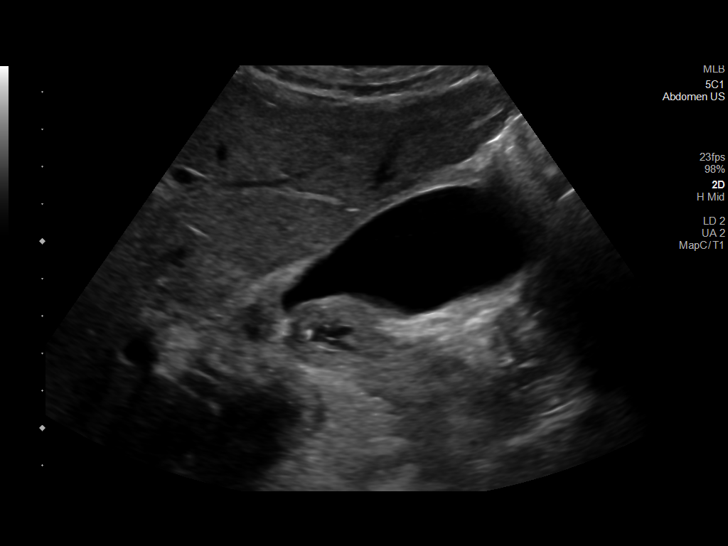
[im 12/94]
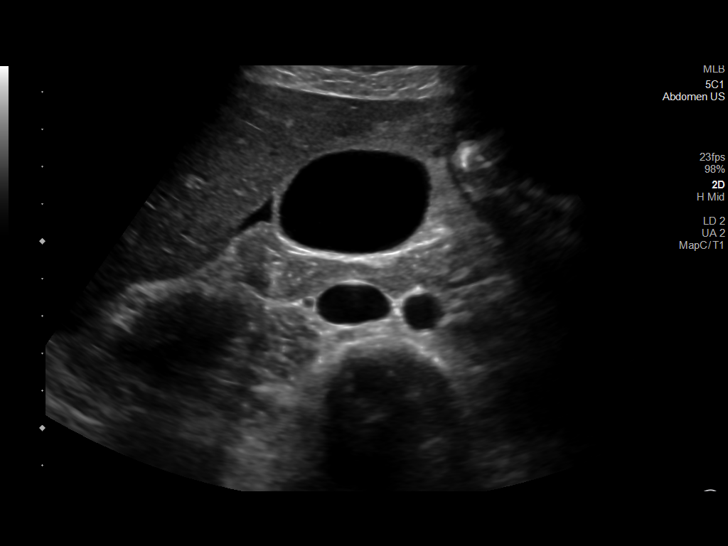
[im 20/94]
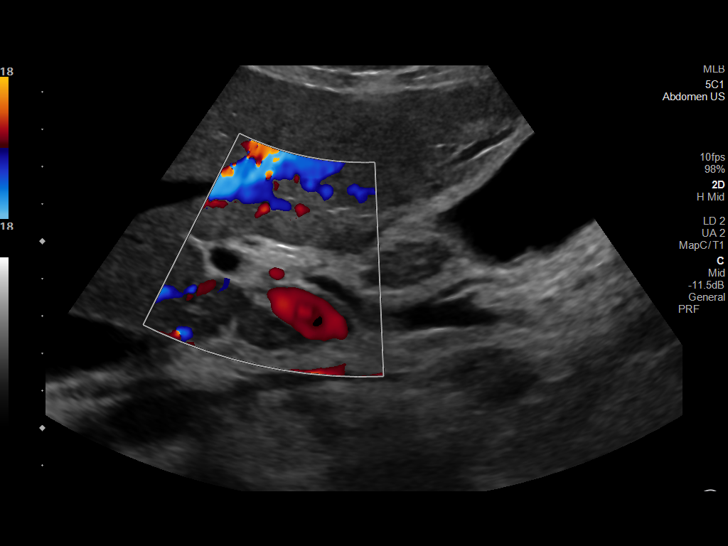
[im 28/94]
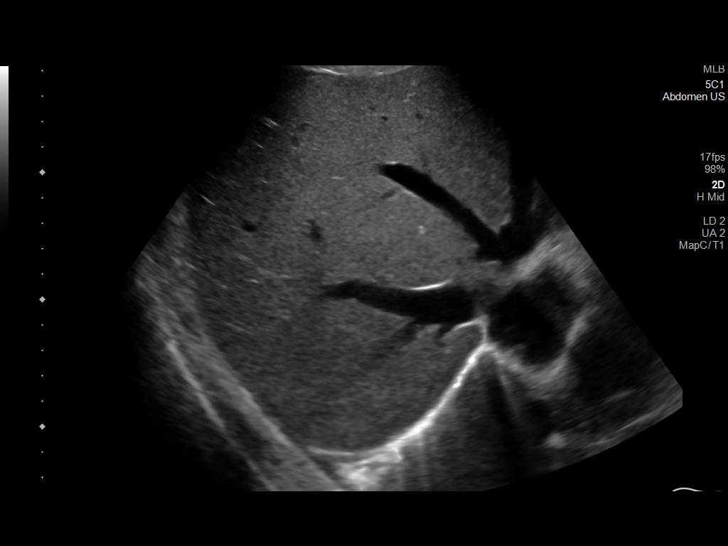
[im 35/94]
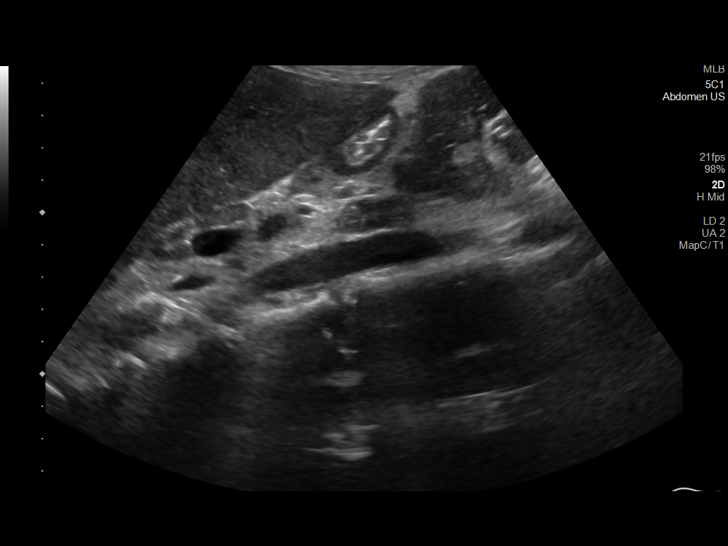
[im 43/94]
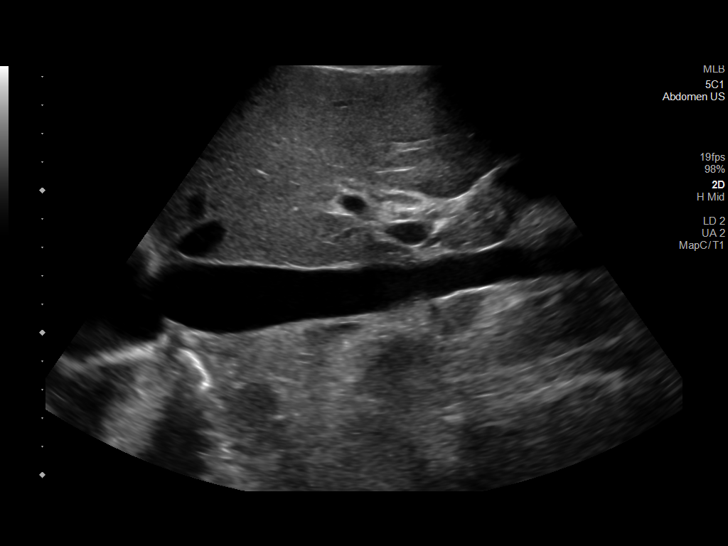
[im 51/94]
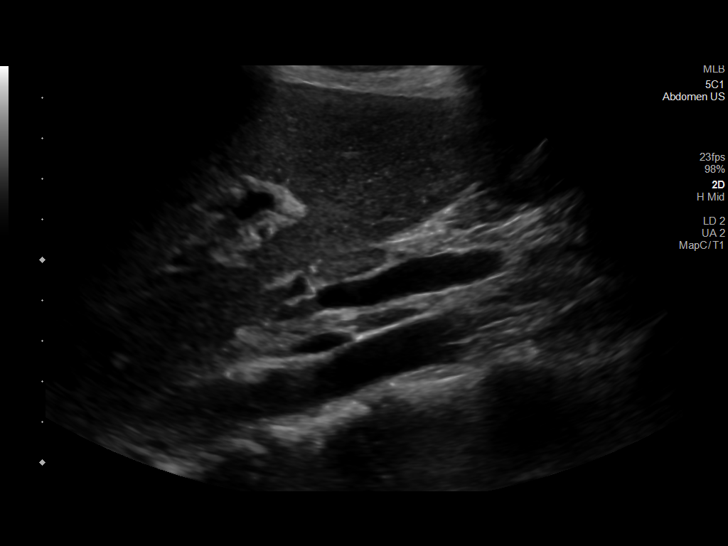
[im 59/94]
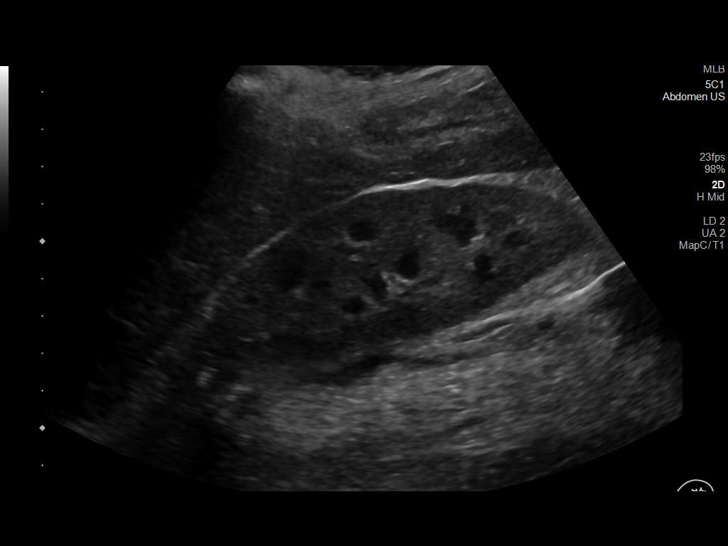
[im 66/94]
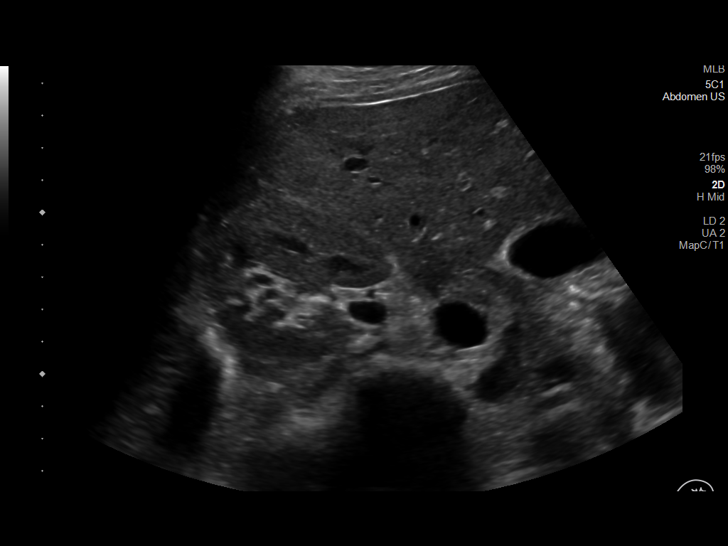
[im 74/94]
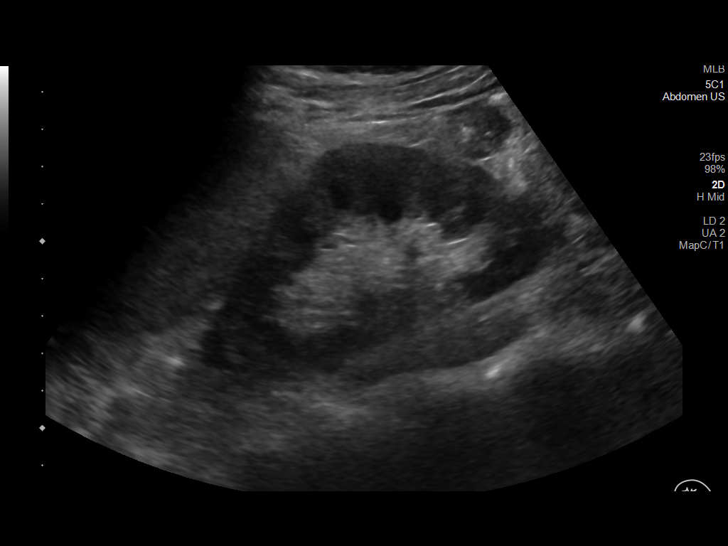
[im 82/94]
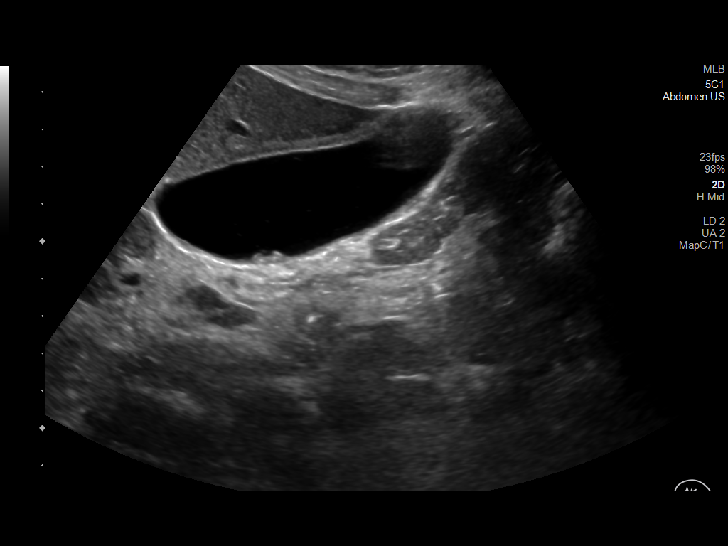
[im 90/94]
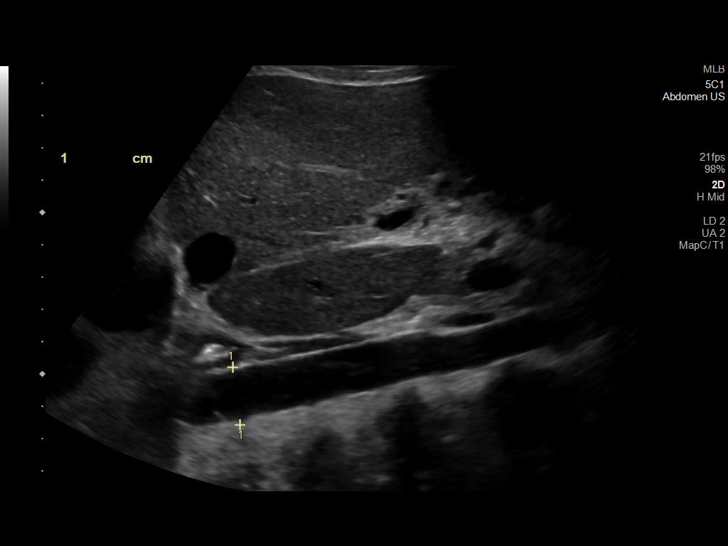

[12 of 25 positions shown; findings below may reference images not displayed]

FINDINGS: ULTRASOUND ABDOMEN

Gallbladder: Gallbladder sludge. No wall thickening or
pericholecystic fluid. Sonographic Murphy's sign was not elicited.

Common bile duct: Diameter: Normal, 3 mm.

Liver: No focal lesion identified. Within normal limits in
parenchymal echogenicity. Portal vein is patent on color Doppler
imaging with normal direction of blood flow towards the liver.

IVC: No abnormality visualized.

Pancreas: Visualized portion unremarkable.

Spleen: Borderline to mild splenomegaly, 12.6 cm and splenic volume
of 444 cc.

Right Kidney: Length: 12.9 cm. Echogenicity within normal limits. No
mass or hydronephrosis visualized.

Left Kidney: Length: 10.1 cm. Apparent decreased length relative to
the right kidney, favored to be due to technique/obliquity.
Echogenicity within normal limits. No mass or hydronephrosis
visualized.

Abdominal aorta: No aneurysm visualized.

Other findings: No ascites.

ULTRASOUND HEPATIC ELASTOGRAPHY

Device: Siemens Helix VTQ

Patient position: Oblique

Transducer 5C1

Number of measurements: 10

Hepatic segment:  8

Median kPa:

IQR:

IQR/Median kPa ratio:

Data quality:  Good

Diagnostic category: > or =17 kPa: highly suggestive of cACLD with
an increased probability of clinically significant portal
hypertension

The use of hepatic elastography is applicable to patients with viral
hepatitis and non-alcoholic fatty liver disease. At this time, there
is insufficient data for the referenced cut-off values and use in
other causes of liver disease, including alcoholic liver disease.
Patients, however, may be assessed by elastography and serve as
their own reference standard/baseline.

In patients with non-alcoholic liver disease, the values suggesting
compensated advanced chronic liver disease (cACLD) may be lower, and
patients may need additional testing with elasticity results of [DATE]
kPa.

Please note that abnormal hepatic elasticity and shear wave
velocities may also be identified in clinical settings other than
with hepatic fibrosis, such as: acute hepatitis, elevated right
heart and central venous pressures including use of beta blockers,
JORI disease (JORI), infiltrative processes such as
mastocytosis/amyloidosis/infiltrative tumor/lymphoma, extrahepatic
cholestasis, with hyperemia in the post-prandial state, and with
liver transplantation. Correlation with patient history, laboratory
data, and clinical condition recommended.

Diagnostic Categories:

< or =5 kPa: high probability of being normal

< or =9 kPa: in the absence of other known clinical signs, rules [DATE] kPa and ?13 kPa: suggestive of cACLD, but needs further testing

>13 kPa: highly suggestive of cACLD

> or =17 kPa: highly suggestive of cACLD with an increased
probability of clinically significant portal hypertension
IMPRESSION: ULTRASOUND ABDOMEN:

Borderline to mild splenomegaly.

Gallbladder sludge.

ULTRASOUND HEPATIC ELASTOGRAPHY:

Median kPa:

Diagnostic category: > or =17 kPa: highly suggestive of cACLD with
an increased probability of clinically significant portal
hypertension

## 2020-02-08 IMAGING — US US ABDOMEN COMPLETE W/ ELASTOGRAPHY
1 series · 12 of 25 positions shown · non-contrast
Comparison: [DATE] abdominal ultrasound.  CT [DATE].

CLINICAL DATA: Hepatitis C.

EXAM:
ULTRASOUND ABDOMEN
ULTRASOUND HEPATIC ELASTOGRAPHY
TECHNIQUE: Sonography of the upper abdomen was performed. In addition,
ultrasound elastography evaluation of the liver was performed. A
region of interest was placed within the right lobe of the liver.
Following application of a compressive sonographic pulse, tissue
compressibility was assessed. Multiple assessments were performed at
the selected site. Median tissue compressibility was determined.
Previously, hepatic stiffness was assessed by shear wave velocity.
Based on recently published Society of Radiologists in Ultrasound
consensus article, reporting is now recommended to be performed in
the SI units of pressure (kiloPascals) representing hepatic
stiffness/elasticity. The obtained result is compared to the
published reference standards. (cACLD = compensated Advanced Chronic
Liver Disease)

[Series 1: us abdomen complete w/elastography · 12 of 32 slices shown]
[im 2/32]
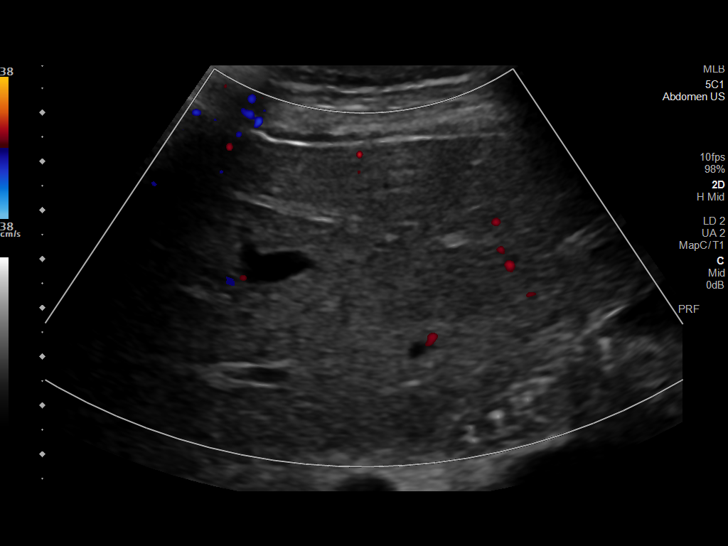
[im 4/32]
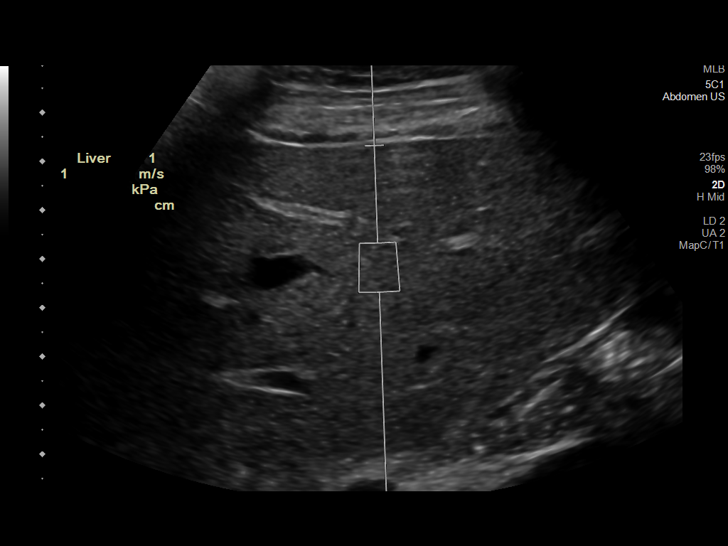
[im 7/32]
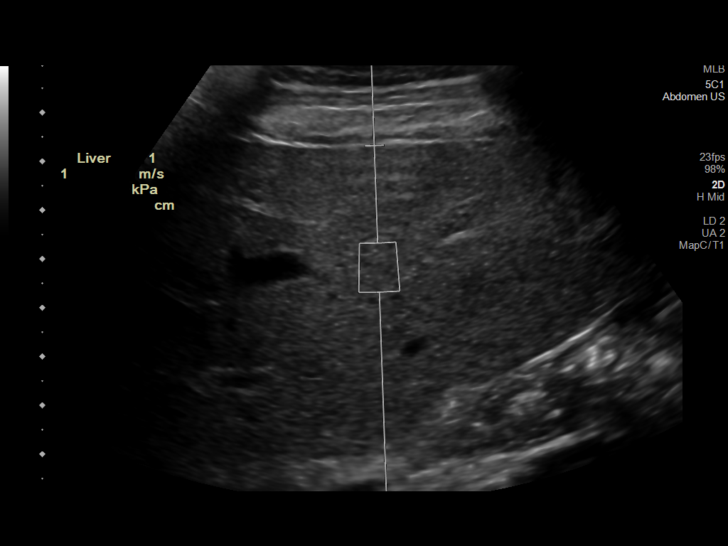
[im 10/32]
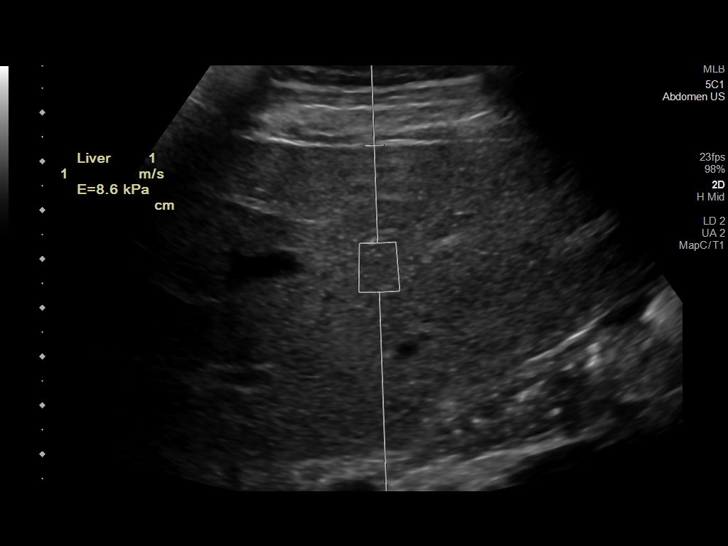
[im 12/32]
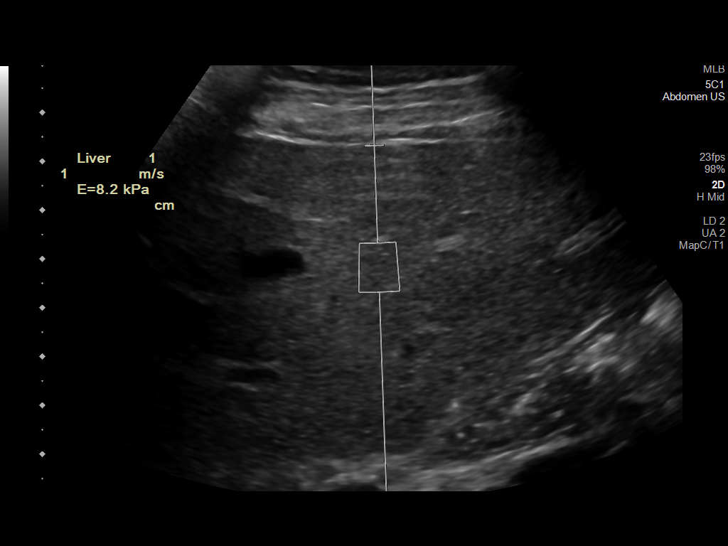
[im 15/32]
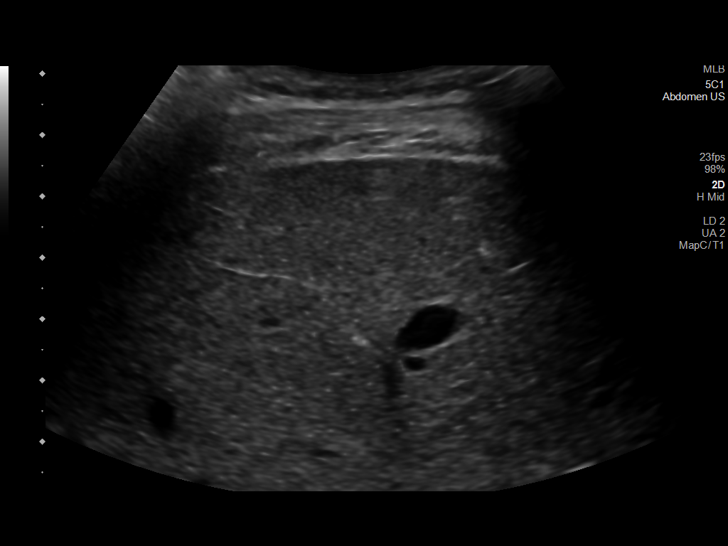
[im 17/32]
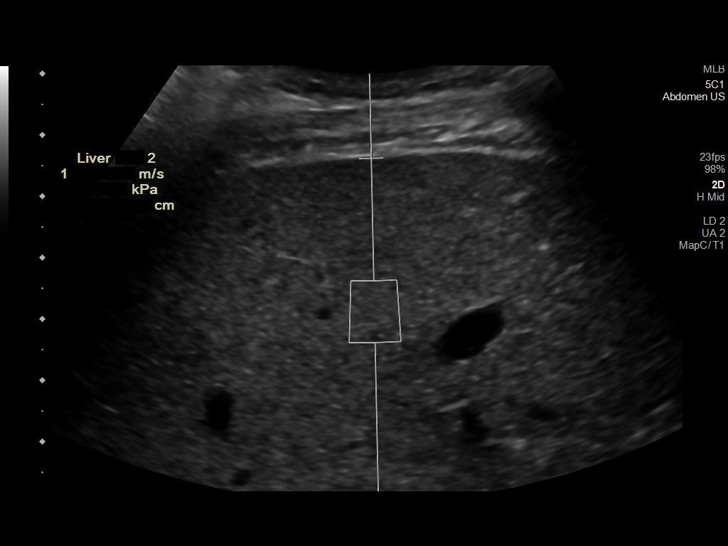
[im 20/32]
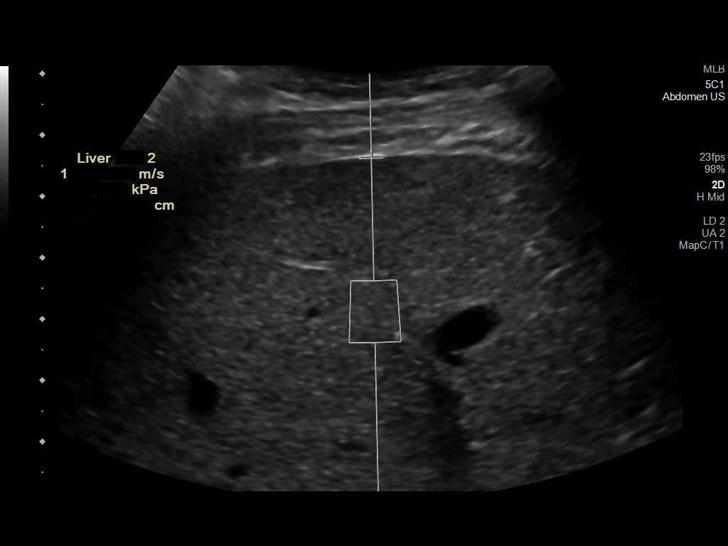
[im 22/32]
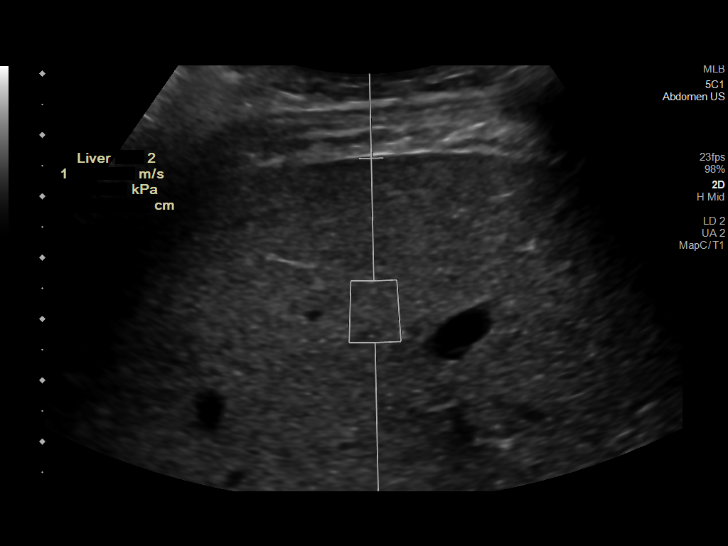
[im 25/32]
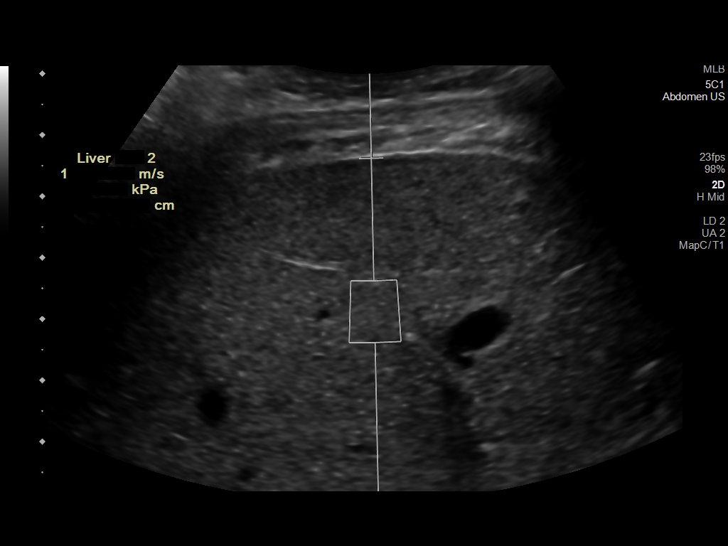
[im 28/32]
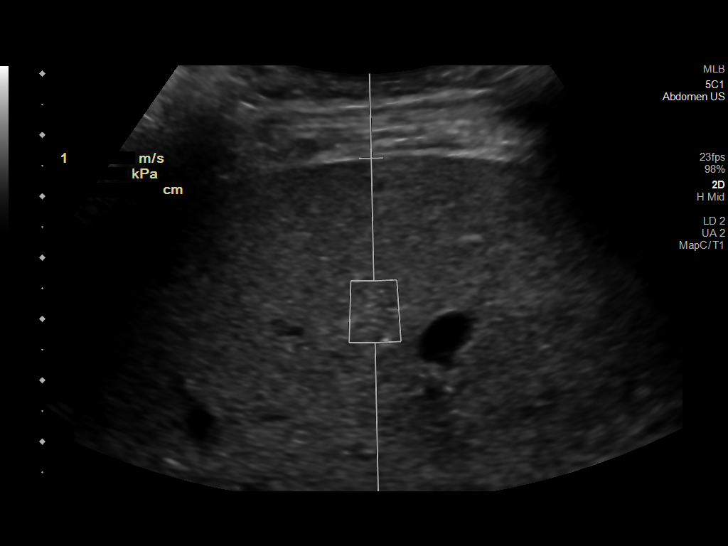
[im 30/32]
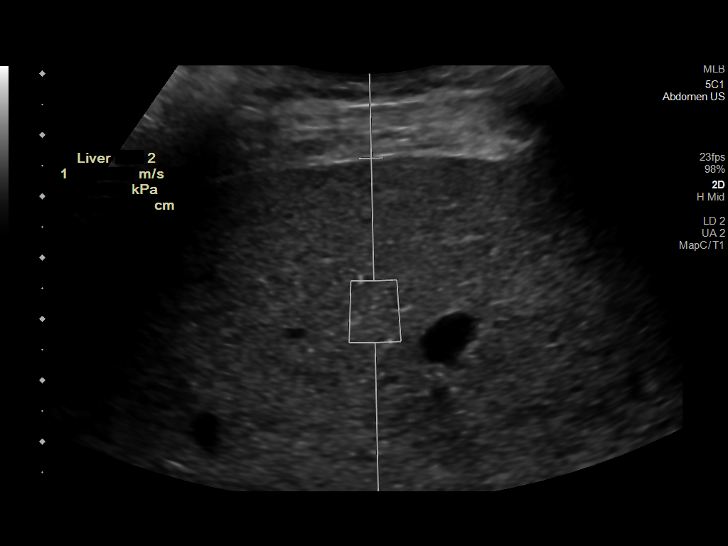

[12 of 25 positions shown; findings below may reference images not displayed]

FINDINGS: ULTRASOUND ABDOMEN

Gallbladder: Gallbladder sludge. No wall thickening or
pericholecystic fluid. Sonographic Murphy's sign was not elicited.

Common bile duct: Diameter: Normal, 3 mm.

Liver: No focal lesion identified. Within normal limits in
parenchymal echogenicity. Portal vein is patent on color Doppler
imaging with normal direction of blood flow towards the liver.

IVC: No abnormality visualized.

Pancreas: Visualized portion unremarkable.

Spleen: Borderline to mild splenomegaly, 12.6 cm and splenic volume
of 444 cc.

Right Kidney: Length: 12.9 cm. Echogenicity within normal limits. No
mass or hydronephrosis visualized.

Left Kidney: Length: 10.1 cm. Apparent decreased length relative to
the right kidney, favored to be due to technique/obliquity.
Echogenicity within normal limits. No mass or hydronephrosis
visualized.

Abdominal aorta: No aneurysm visualized.

Other findings: No ascites.

ULTRASOUND HEPATIC ELASTOGRAPHY

Device: Siemens Helix VTQ

Patient position: Oblique

Transducer 5C1

Number of measurements: 10

Hepatic segment:  8

Median kPa:

IQR:

IQR/Median kPa ratio:

Data quality:  Good

Diagnostic category: > or =17 kPa: highly suggestive of cACLD with
an increased probability of clinically significant portal
hypertension

The use of hepatic elastography is applicable to patients with viral
hepatitis and non-alcoholic fatty liver disease. At this time, there
is insufficient data for the referenced cut-off values and use in
other causes of liver disease, including alcoholic liver disease.
Patients, however, may be assessed by elastography and serve as
their own reference standard/baseline.

In patients with non-alcoholic liver disease, the values suggesting
compensated advanced chronic liver disease (cACLD) may be lower, and
patients may need additional testing with elasticity results of [DATE]
kPa.

Please note that abnormal hepatic elasticity and shear wave
velocities may also be identified in clinical settings other than
with hepatic fibrosis, such as: acute hepatitis, elevated right
heart and central venous pressures including use of beta blockers,
JORI disease (JORI), infiltrative processes such as
mastocytosis/amyloidosis/infiltrative tumor/lymphoma, extrahepatic
cholestasis, with hyperemia in the post-prandial state, and with
liver transplantation. Correlation with patient history, laboratory
data, and clinical condition recommended.

Diagnostic Categories:

< or =5 kPa: high probability of being normal

< or =9 kPa: in the absence of other known clinical signs, rules [DATE] kPa and ?13 kPa: suggestive of cACLD, but needs further testing

>13 kPa: highly suggestive of cACLD

> or =17 kPa: highly suggestive of cACLD with an increased
probability of clinically significant portal hypertension
IMPRESSION: ULTRASOUND ABDOMEN:

Borderline to mild splenomegaly.

Gallbladder sludge.

ULTRASOUND HEPATIC ELASTOGRAPHY:

Median kPa:

Diagnostic category: > or =17 kPa: highly suggestive of cACLD with
an increased probability of clinically significant portal
hypertension

## 2020-02-17 ENCOUNTER — Encounter: Payer: Self-pay | Admitting: Thoracic Surgery (Cardiothoracic Vascular Surgery)

## 2020-02-17 ENCOUNTER — Other Ambulatory Visit: Payer: Self-pay

## 2020-02-17 ENCOUNTER — Ambulatory Visit (INDEPENDENT_AMBULATORY_CARE_PROVIDER_SITE_OTHER): Payer: Self-pay | Admitting: Thoracic Surgery (Cardiothoracic Vascular Surgery)

## 2020-02-17 VITALS — BP 106/78 | HR 88 | Temp 98.2°F | Resp 20 | Ht 61.0 in | Wt 99.0 lb

## 2020-02-17 DIAGNOSIS — I079 Rheumatic tricuspid valve disease, unspecified: Secondary | ICD-10-CM

## 2020-02-17 NOTE — Progress Notes (Signed)
      [address]       [place] [address]             [phone]        Amber [name] [facility] Medical Record #[id number] Date of Birth: Jul 30, 1990  Referring: Oretha Milch, MD Primary Care: Patient, No Pcp Per Primary Cardiologist:No primary care provider on file.  Reason for visit:   follow-up  History of Present Illness:     Amber [name] comes in for her first follow-up appointment.  She underwent an angio VAC debridement of the tricuspid valve vegetation on December 05, 2019.  Her cultures originally grew MRSA.  Prior to the debridement she had severe tricuspid valve regurgitation.  She recently completed a course of IV antibiotics and has been discharged from the hospital.  Overall she is doing well while staying with her mother.  Her urine drug screen is only positive for methadone, and she appears resolute and remaining clean.  She denies any lower extremity swelling or abdominal swelling.  She denies any abdominal pain as well.  Physical Exam: BP 106/78 (BP Location: Right Arm, Patient Position: Sitting)   Pulse 88   Temp 98.2 F (36.8 C)   Resp 20   Ht 5\' 1"  (1.549 m)   Wt 99 lb (44.9 kg)   SpO2 98% Comment: RA with mask on  BMI 18.71 kg/m   Alert NAD.  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  She also has a history of IV drug abuse.  She remains clean since being discharged now that she has better family support.  We had a long discussion about the importance of remaining drug-free, as well as the state of her tricuspid valve.  I think that it is important that she meet with the heart failure team for further surveillance.  Her last echocardiogram was at the end of November at which point her RV function was only mildly reduced.  If  she continues to to remain asymptomatic, and there is no evidence of RV dysfunction then we can delay any tricuspid valve surgery.  She will need to remain clean to undergo any tricuspid valve surgery in the future but at this point I think that there is no strong indication.  Referral to heart failure clinic Will touch base with her cardiologist in regards to her echocardiograms.  December 02/17/2020 10:21 AM

## 2020-02-23 ENCOUNTER — Ambulatory Visit: Payer: Medicaid Other | Admitting: Infectious Diseases

## 2020-02-29 ENCOUNTER — Ambulatory Visit (HOSPITAL_COMMUNITY): Payer: Self-pay | Attending: Nurse Practitioner | Admitting: Physical Therapy

## 2020-02-29 ENCOUNTER — Other Ambulatory Visit: Payer: Self-pay

## 2020-02-29 ENCOUNTER — Encounter (HOSPITAL_COMMUNITY): Payer: Self-pay | Admitting: Physical Therapy

## 2020-02-29 DIAGNOSIS — M6281 Muscle weakness (generalized): Secondary | ICD-10-CM | POA: Insufficient documentation

## 2020-02-29 DIAGNOSIS — M79604 Pain in right leg: Secondary | ICD-10-CM | POA: Insufficient documentation

## 2020-02-29 DIAGNOSIS — R262 Difficulty in walking, not elsewhere classified: Secondary | ICD-10-CM | POA: Insufficient documentation
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
## 2020-02-29 NOTE — Patient Instructions (Signed)
Date: 02/29/2020 Prepared by: Georges Lynch  Exercises Hooklying Isometric Hip Abduction with Belt - 3 x daily - 7 x weekly - 1 sets - 10 reps - 5 second hold Supine Hip Adduction Isometric with Ball - 3 x daily - 7 x weekly - 1 sets - 10 reps - second hold Supine Bridge - 3 x daily - 7 x weekly - 1 sets - 10 reps - second hold

## 2020-03-09 ENCOUNTER — Telehealth (HOSPITAL_COMMUNITY): Payer: Self-pay | Admitting: Physical Therapy

## 2020-03-09 ENCOUNTER — Ambulatory Visit (HOSPITAL_COMMUNITY): Payer: Self-pay | Admitting: Physical Therapy

## 2020-03-09 NOTE — Telephone Encounter (Signed)
pt called to cx due to she is sick

## 2020-03-14 ENCOUNTER — Telehealth (HOSPITAL_COMMUNITY): Payer: Self-pay | Admitting: Physical Therapy

## 2020-03-14 ENCOUNTER — Ambulatory Visit (HOSPITAL_COMMUNITY): Payer: Self-pay | Admitting: Physical Therapy

## 2020-03-14 ENCOUNTER — Telehealth: Payer: Self-pay

## 2020-03-14 NOTE — Telephone Encounter (Signed)
Received call today from patient stating she will be having emergent dental appointment. Would like to know if she will need to be on any antibiotics before this can be done.  Will forward message to MD to advise.

## 2020-03-14 NOTE — Telephone Encounter (Signed)
pt called to cx due to she has a dental appt today

## 2020-03-14 NOTE — Telephone Encounter (Signed)
Spoke with Dr. Earlene Plater who recommended patient be on Amoxicillin. Relayed this to patient's mother who will update oral surgeon.

## 2020-03-15 ENCOUNTER — Telehealth: Payer: Self-pay

## 2020-03-15 ENCOUNTER — Other Ambulatory Visit: Payer: Self-pay

## 2020-03-15 ENCOUNTER — Ambulatory Visit (INDEPENDENT_AMBULATORY_CARE_PROVIDER_SITE_OTHER): Payer: Self-pay | Admitting: Infectious Diseases

## 2020-03-15 ENCOUNTER — Encounter: Payer: Self-pay | Admitting: Infectious Diseases

## 2020-03-15 VITALS — BP 105/66 | HR 77 | Wt 106.0 lb

## 2020-03-15 DIAGNOSIS — B182 Chronic viral hepatitis C: Secondary | ICD-10-CM

## 2020-03-15 DIAGNOSIS — I079 Rheumatic tricuspid valve disease, unspecified: Secondary | ICD-10-CM

## 2020-03-15 NOTE — Telephone Encounter (Signed)
[facility] Patient Advocate Encounter  Completed and sent Support Path application for EPCLUSA for this patient who is uninsured.    Patient assistance phone number for follow up is [phone].   This encounter will be updated until final determination.   Donna Dean, CPhT Specialty Pharmacy Patient [facility] Phone: [phone] Fax:  [phone]  

## 2020-03-15 NOTE — Progress Notes (Addendum)
[facility]                                      [address], [place], [place], [address]                                               Phn. [phone]; Fax: [phone]                                                               Date: 03/15/20 Reason for Follow Up - Hepatitis C treatmnent   HPI: Amber [name] is a 30 y.o.old female with PMH of IVDU and currently on rehabilitation( on methadone/suboxone) and hepatitis C who is here for follow up for discussion of prior lab results and having a treatment plan.   Discussed prior Hep C lab results with her. She is willing to be started on treatment for it.  Will plan for Epclusa for 12 weeks. No significant DDIS with her current medications.   She is currently taking Amoxicillin that was prescribed to her due to dental pain. She is scheduled to have her 2 teeth removed next Tuesday.  She was  admitted in the hospital 11/30/19-01/16/20 for MRSA bacteremia with septic pulmonary emboli, TV endocarditis s/p angiovac. She completed treatment with IV abx on 01/16/20 and was discharged on the same day. She has also followed up with CT SX Dr Sherlynn Stalls Foot on 2/4 where it was mentioned that " she will need to remain clean to undergo any tricuspid valve surgery in the future but at this point, there is no strong indication" She was referred to Heart failure clinic with a plan to follow up on her echocardiogram.   Smoking cigarettes 2 a day, Denies alcohol and IVDU Living with her mother.  Feels great and no complaints today  ROS: Denies yellowish discoloration of sclera and skin, abdominal pain/distension, hematemesis.            Denies fever, chills, nightsweats, nausea, vomiting, diarrhea, constipation, weight loss, recent hospitalizations, rashes, joint complaints, shortness of breath, chest pain, headaches, dysuria .  Past Medical History:  Diagnosis Date  . Heroin abuse  (HCC)   . Kidney stones   . MRSA (methicillin resistant staph aureus) culture positive   . Ovarian cyst   . UTI (lower urinary tract infection)    Past Surgical History:  Procedure Laterality Date  . APPLICATION OF ANGIOVAC N/A 12/05/2019   Procedure: APPLICATION OF ANGIOVAC;  Surgeon: Corliss Skains, MD;  Location: [facility] OR;  Service: Vascular;  Laterality: N/A;  Patient will need intraoperative TEE Can be performed in OR 14, 15, or 17 if OR 16 is not available  . FINGER SURGERY    . TEE WITHOUT CARDIOVERSION N/A 11/07/2019   Procedure: TRANSESOPHAGEAL ECHOCARDIOGRAM (TEE);  Surgeon: Wendall Stade, MD;  [facility] ENDOSCOPY;  Service: Cardiovascular;  Laterality: N/A;  . TEE WITHOUT CARDIOVERSION  12/05/2019   Procedure: TRANSESOPHAGEAL ECHOCARDIOGRAM (TEE);  Surgeon: Corliss Skains, MD;  Location: [facility] OR;  Service: Vascular;;  .  TEE WITHOUT CARDIOVERSION N/A 12/05/2019   Procedure: TRANSESOPHAGEAL ECHOCARDIOGRAM (TEE);  Surgeon: Corliss Skains, MD;  Location: [facility] OR;  Service: Thoracic;  Laterality: N/A;   Current Outpatient Medications on File Prior to Visit  Medication Sig Dispense Refill  . acetaminophen (TYLENOL) 500 MG tablet Take 1,000 mg by mouth every 6 (six) hours as needed for headache.     . buprenorphine-naloxone (SUBOXONE) 8-2 mg SUBL SL tablet Place 2 tablets under the tongue daily.    . diclofenac Sodium (VOLTAREN) 1 % GEL Apply 4 g topically 4 (four) times daily. 4 g 6  . escitalopram (LEXAPRO) 20 MG tablet Take 1 tablet (20 mg total) by mouth at bedtime. 30 tablet 6  . gabapentin (NEURONTIN) 600 MG tablet Take 0.5 tablets (300 mg total) by mouth 3 (three) times daily. 90 tablet 6  . ibuprofen (ADVIL) 600 MG tablet Take 1 tablet (600 mg total) by mouth every 6 (six) hours as needed. 30 tablet 0  . lidocaine (LIDODERM) 5 % Place 2 patches onto the skin daily. Remove & Discard patch within 12 hours or as directed by MD 30 patch 0  . Multiple Vitamin  (MULTIVITAMIN WITH MINERALS) TABS tablet Take 1 tablet by mouth daily. 30 tablet 6  . methadone (DOLOPHINE) 10 MG tablet Take 2 tablets (20 mg total) by mouth every 12 (twelve) hours. (Patient not taking: Reported on 03/15/2020) 30 tablet 0   No current facility-administered medications on file prior to visit.    Allergies  Allergen Reactions  . Bee Venom Anaphylaxis  . Other     Bee sting   Social History   Socioeconomic History  . Marital status: Single    Spouse name: Not on file  . Number of children: Not on file  . Years of education: Not on file  . Highest education level: Not on file  Occupational History  . Not on file  Tobacco Use  . Smoking status: Former Smoker    Types: Cigarettes  . Smokeless tobacco: Never Used  Vaping Use  . Vaping Use: Never used  Substance and Sexual Activity  . Alcohol use: No  . Drug use: Yes    Types: Marijuana, IV    Comment: opiods- pain medication/heroin  . Sexual activity: Yes    Birth control/protection: None  Other Topics Concern  . Not on file  Social History Narrative  . Not on file   Social Determinants of Health   Financial Resource Strain: Not on file  Food Insecurity: Not on file  Transportation Needs: Not on file  Physical Activity: Not on file  Stress: Not on file  Social Connections: Not on file  Intimate Partner Violence: Not on file    Vitals  Wt 106 lb (48.1 kg)   BMI 20.03 kg/m   Gen: Alert and oriented x 3, no acute distress, BALD HEAD HEENT: [place]/AT, PERL, no scleral icterus, no pale conjunctivae, hearing normal, oral mucosa moist, DENTAL CAVITIES IN THE LEFT LOWER MOLAR Neck: Supple, no lymphadenopathy Cardio: Regular rate and rhythm; +S1 and S2; no murmurs, gallops, or rubs Resp: CTAB; no wheezes, rhonchi, or rales GI: Soft, nontender, nondistended, bowel sounds present GU: Musc: Extremities: No cyanosis, clubbing, or edema; +2 PT and DP pulses, HEALED WOUND IN THE LEFT FOREARM Skin: No rashes,  lesions, or ecchymoses Neuro: No focal deficits Psych: Calm, cooperative   Laboratory  CBC Latest Ref Rng & Units 01/15/2020 01/09/2020 01/04/2020  WBC 4.0 - 10.5 K/uL 8.1 7.6 11.4(H)  Hemoglobin 12.0 - 15.0 g/dL 8.3(M) 1.9(Q) 2.2(W)  Hematocrit 36.0 - 46.0 % 30.2(L) 31.8(L) 29.4(L)  Platelets 150 - 400 K/uL 254 289 224   CMP Latest Ref Rng & Units 01/26/2020 01/15/2020 01/09/2020  Glucose 70 - 99 mg/dL - 92 979(G)  BUN 6 - 20 mg/dL - 11 7  Creatinine 9.21 - 1.00 mg/dL - 1.94(R) 7.40  Sodium 135 - 145 mmol/L - 133(L) 135  Potassium 3.5 - 5.1 mmol/L - 4.2 4.0  Chloride 98 - 111 mmol/L - 98 100  CO2 22 - 32 mmol/L - 28 27  Calcium 8.9 - 10.3 mg/dL - 9.1 8.1(K)  Total Protein 6.5 - 8.1 g/dL - - -  Total Bilirubin 0.3 - 1.2 mg/dL - - -  Alkaline Phos 38 - 126 U/L - - -  AST 15 - 41 U/L - - -  ALT 6 - 29 U/L 20 - -    Assessment/Plan: Hepatitis C in the setting or prior IVDU Prior treatment: none  GT: 1a  Evidence of cirrhosis: yes, on Ultrasound. However, Fibrotest was F0 and Fib 4 score was 1.18 Interested in treatment: yes Potential DDI: none   Will plan for Epclusa for 12 weeks Checked her current drug list for DDIs Referral to GI to r/o varices Discussed with her about screening US for [facility] every 6 months, next due in end of July and August 2022   Smoking Counseled   Counseling done on the following -Natural progression of hep c, transmission (avoid sharing personal hygiene equipment), prevention, risks of left untreated and treatment options  -Avoid hepatotoxins like alcohol and excessive acetamaminphen (no more than 2 gram a day) -Avoid eating raw sea food -Risks of re-infection  -Hepatitis coinfection and vaccination( Pneumococcal vaccination in the cirrhotics  - HCC screening with [name] every 6 months - EGD to r/o varices in cirrhotics  I spent 30 minutes with the patient including review of prior medical records with greater than 50% of time in face to face counsel of  the patient.    Patient's labs were reviewed as well as his previous records. Patients questions were addressed and answered.   Electronically signed by:  Odette Fraction, MD Infectious Diseases  Office phone [phone] Fax no. [phone]

## 2020-03-16 LAB — RPR: RPR Ser Ql: NONREACTIVE

## 2020-03-20 ENCOUNTER — Telehealth: Payer: Self-pay

## 2020-03-20 NOTE — Telephone Encounter (Signed)
[facility] Patient Advocate Encounter  Completed and sent Support Path application for Epclusa for this patient who is uninsured.    Patient is approved from 03/20/20 to 06/12/20. [employer] will contact the patient to set up delivery.  I will reach out to the patient to get a start date and make an appointment for a Hep-C follow up.  This encounter will be updated until final determination.   Clearance Coots, CPhT Specialty Pharmacy Patient [facility] for Infectious Disease Phone: [phone] Fax:  [phone]

## 2020-03-20 NOTE — Telephone Encounter (Signed)
[facility] Patient Advocate Encounter  Completed and sent Support Path application for EPCLUSA for this patient who is uninsured.    Patient was approved I sent a prescription for Epclusa to [facility].   I will follow up with patient for her delivery date and set up her Hep-C follow up.  This encounter will be updated until final determination.   Clearance Coots, CPhT Specialty Pharmacy Patient [facility] for Infectious Disease Phone: [phone] Fax:  [phone]

## 2020-03-20 NOTE — Telephone Encounter (Signed)
Thanks Lupita Leash. Fu can be made in a month if start date

## 2020-03-23 ENCOUNTER — Ambulatory Visit (HOSPITAL_COMMUNITY): Payer: Self-pay

## 2020-03-28 ENCOUNTER — Ambulatory Visit: Payer: Medicaid Other | Admitting: Internal Medicine

## 2020-03-29 ENCOUNTER — Ambulatory Visit (HOSPITAL_COMMUNITY): Payer: Self-pay | Admitting: Physical Therapy

## 2020-04-16 ENCOUNTER — Encounter: Payer: Self-pay | Admitting: Internal Medicine

## 2020-04-16 ENCOUNTER — Ambulatory Visit (INDEPENDENT_AMBULATORY_CARE_PROVIDER_SITE_OTHER): Payer: Self-pay | Admitting: Internal Medicine

## 2020-04-16 ENCOUNTER — Other Ambulatory Visit: Payer: Self-pay

## 2020-04-16 VITALS — BP 130/74 | HR 57 | Ht 61.0 in | Wt 109.0 lb

## 2020-04-16 DIAGNOSIS — I50810 Right heart failure, unspecified: Secondary | ICD-10-CM

## 2020-04-16 DIAGNOSIS — I071 Rheumatic tricuspid insufficiency: Secondary | ICD-10-CM

## 2020-04-16 MED ORDER — TORSEMIDE 10 MG PO TABS
10.0000 mg | ORAL_TABLET | Freq: Every day | ORAL | 3 refills | Status: DC | PRN
Start: 2020-04-16 — End: 2021-01-21

## 2020-04-16 NOTE — Progress Notes (Signed)
Cardiology Office Note:    Date:  04/16/2020   ID:  Amber [name], DOB 09-07-1990, MRN [id number]  PCP:  Patient, No Pcp Per (Inactive)  Cardiologist:  No primary care provider on file.  Electrophysiologist:  None   Referring MD: Amber Skains, MD   Chief Complaint/Reason for Referral: Tricuspid valve endocarditis  History of Present Illness:    Amber [name] is a 30 y.o. female with a history of IV drug use and hepatitis C who presented to the hospital in November 2021 for worsening SOB, weakness and fatigue, found obtunded in her car in the ER parking lot. 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 She had a prolonged hospitalization and has received IV antibiotics for treatment of septic shock with MRSA bacteremia with endocarditis.   She is referred by Dr. Cliffton Asters for cardiovascular review of right heart function for timing of eventual tricuspid valve replacement.   She is overall well but does experience fatigue. Seems to have class II NYHA symptoms. No CP, mild SOB, no LE edema. No palpitations, no syncope.   She has been able to remain drug use free and is continued on suboxone.   Past Medical History:  Diagnosis Date  . Heroin abuse (HCC)   . Kidney stones   . MRSA (methicillin resistant staph aureus) culture positive   . Ovarian cyst   . UTI (lower urinary tract infection)     Past Surgical History:  Procedure Laterality Date  . APPLICATION OF ANGIOVAC N/A 12/05/2019   Procedure: APPLICATION OF ANGIOVAC;  Surgeon: Amber Skains, MD;  Location: [facility] OR;  Service: Vascular;  Laterality: N/A;  Patient will need intraoperative TEE Can be performed in OR 14, 15, or 17 if OR 16 is not available  . FINGER SURGERY    . TEE WITHOUT CARDIOVERSION N/A 11/07/2019   Procedure: TRANSESOPHAGEAL ECHOCARDIOGRAM (TEE);  Surgeon: Wendall Stade, MD;  Location: [facility] ENDOSCOPY;  Service: Cardiovascular;   Laterality: N/A;  . TEE WITHOUT CARDIOVERSION  12/05/2019   Procedure: TRANSESOPHAGEAL ECHOCARDIOGRAM (TEE);  Surgeon: Amber Skains, MD;  Location: [facility] OR;  Service: Vascular;;  . TEE WITHOUT CARDIOVERSION N/A 12/05/2019   Procedure: TRANSESOPHAGEAL ECHOCARDIOGRAM (TEE);  Surgeon: Amber Skains, MD;  Location: [facility] OR;  Service: Thoracic;  Laterality: N/A;    Current Medications: Current Meds  Medication Sig  . acetaminophen (TYLENOL) 500 MG tablet Take 1,000 mg by mouth every 6 (six) hours as needed for headache.   . buprenorphine-naloxone (SUBOXONE) 8-2 mg SUBL SL tablet Place 2 tablets under the tongue daily.  . diclofenac Sodium (VOLTAREN) 1 % GEL Apply 4 g topically 4 (four) times daily.  Marland Kitchen escitalopram (LEXAPRO) 20 MG tablet Take 1 tablet (20 mg total) by mouth at bedtime.  . gabapentin (NEURONTIN) 600 MG tablet Take 0.5 tablets (300 mg total) by mouth 3 (three) times daily.  Marland Kitchen ibuprofen (ADVIL) 600 MG tablet Take 1 tablet (600 mg total) by mouth every 6 (six) hours as needed.  . lidocaine (LIDODERM) 5 % PLACE 2 PATCHES ONTO THE SKIN DAILY. REMOVE & DISCARD PATCH WITHIN 12 HOURS OR AS DIRECTED BY MD  . methadone (DOLOPHINE) 10 MG tablet TAKE 2 TABLETS (20 MG TOTAL) BY MOUTH EVERY TWELVE HOURS.  . mirtazapine (REMERON) 15 MG tablet Take 15 mg by mouth at bedtime.  . Multiple Vitamin (MULTIVITAMIN WITH MINERALS) TABS tablet Take 1 tablet by mouth daily.     Allergies:  Bee venom and Other   Social History   Tobacco Use  . Smoking status: Former Smoker    Types: Cigarettes  . Smokeless tobacco: Never Used  Vaping Use  . Vaping Use: Never used  Substance Use Topics  . Alcohol use: No  . Drug use: Yes    Types: Marijuana, IV    Comment: opiods- pain medication/heroin     Family History: The patient's family history includes Valvular heart disease in her maternal grandmother.  ROS:   Please see the history of present illness.    All other systems reviewed and  are negative.  EKGs/Labs/Other Studies Reviewed:    The following studies were reviewed today:  EKG:  Sinus Bradycardia, LAE, T wave abnl anteriorly    Recent Labs: 11/03/2019: TSH 0.402 11/30/2019: B Natriuretic Peptide 583.0 12/15/2019: Magnesium 1.7 01/15/2020: BUN 11; Creatinine, Ser 0.43; Hemoglobin 9.2; Platelets 254; Potassium 4.2; Sodium 133 01/26/2020: ALT 20  Recent Lipid Panel    Component Value Date/Time   TRIG 114 12/06/2019 1700    Physical Exam:    VS:  BP 130/74   Pulse (!) 57   Ht 5\' 1"  (1.549 m)   Wt 109 lb (49.4 kg)   SpO2 98%   BMI 20.60 kg/m     Wt Readings from Last 5 Encounters:  04/16/20 109 lb (49.4 kg)  03/15/20 106 lb (48.1 kg)  02/17/20 99 lb (44.9 kg)  01/26/20 99 lb (44.9 kg)  01/04/20 92 lb 13 oz (42.1 kg)    Constitutional: No acute distress Eyes: sclera non-icteric, normal conjunctiva and lids ENMT: normal dentition, moist mucous membranes Cardiovascular: regular rhythm, normal rate, 2/6 holosystolic murmur along sternal border. S1 and S2 normal. Radial pulses normal bilaterally. V wave noted with JVD to angle of jaw supine.  Respiratory: clear to auscultation bilaterally GI : normal bowel sounds, soft and nontender. No distention.   MSK: extremities warm, well perfused. No edema.  NEURO: grossly nonfocal exam, moves all extremities. PSYCH: alert and oriented x 3, normal mood and affect.   ASSESSMENT:    1. Right-sided heart failure, unspecified HF chronicity (HCC)   2. Severe tricuspid regurgitation    PLAN:    I have independently reviewed the images from her serial echocardiograms in November including the intraoperative TEE.  There was a large tricuspid valve vegetation which is no longer present, but there is apparent tricuspid valve destruction secondary to endocarditis.  The tricuspid valve appears to have wide-open regurgitation.  We discussed in detail of the optimal management for timing of tricuspid valve replacement.  We  will plan to base this on the function and size of the right heart as well as her symptoms.  While she overall does well, she does note some fatigue and exertional dyspnea which should be taken into account in considering tricuspid valve surgery.  I think the best way to quantitate right heart size and function would be with cardiac MRI, and she is amenable to proceeding.  Her mother joins her for the visit today and provides collaborative history and also agrees this is the next best step.  We discussed that if RV volume index reaches a certain threshold, in the setting of mild symptoms it may be reasonable to consider repair sooner rather than later.  Her initial consultation was also intended to occur with heart failure clinic.  I offered her consultation with heart failure clinic after her cardiac MRI for continued optimization, and she would like to pursue this.  We will proceed with cardiac MRI to help define tricuspid valve anatomy if able to be visualized, but more importantly to quantitate RV volume index and right ventricular function.  Total time of encounter: 60 minutes total time of encounter, including 40 minutes spent in face-to-face patient care on the date of this encounter. This time includes coordination of care and counseling regarding above mentioned problem list. Remainder of non-face-to-face time involved reviewing chart documents/testing relevant to the patient encounter and documentation in the medical record. I have independently reviewed documentation from referring provider.   Weston Brass, MD, [facility] [place]  [facility]    Medication Adjustments/Labs and Tests Ordered: Current medicines are reviewed at length with the patient today.  Concerns regarding medicines are outlined above.   Orders Placed This Encounter  Procedures  . MR CARDIAC MORPHOLOGY W WO CONTRAST  . AMB referral to CHF clinic  . EKG 12-Lead    Meds ordered this encounter  Medications  .  torsemide (DEMADEX) 10 MG tablet    Sig: Take 1 tablet (10 mg total) by mouth daily as needed (AS NEEDED FOR SHORTNESS OF BREATH OR SWELLING).    Dispense:  30 tablet    Refill:  3    Patient Instructions  Medication Instructions:  START: TORSEMIDE 10mg  DAILY AS NEEDED FOR SWELLING OR SHORTNESS OF BREATH  *If you need a refill on your cardiac medications before your next appointment, please call your pharmacy*  Testing/Procedures: Your physician has requested that you have a cardiac MRI. Cardiac MRI uses a computer to create images of your heart as its beating, producing both still and moving pictures of your heart and major blood vessels. For further information please visit . Please follow the instruction sheet given to you today for more information.  Follow-Up: At [facility], you and your health needs are our priority.  As part of our continuing mission to provide you with exceptional heart care, we have created designated Provider Care Teams.  These Care Teams include your primary Cardiologist (physician) and Advanced Practice Providers (APPs -  Physician Assistants and Nurse Practitioners) who all work together to provide you with the care you need, when you need it.  Your next appointment:   AFTER CARDIAC MRI  The format for your next appointment:   In Person  Provider:   [facility], MD  Other Instructions AMBULATORY REFERRAL TO ADVANCED HEART FAILURE CLINIC- SOMEONE WILL REACH OUT TO YOU TO SCHEDULE THIS.

## 2020-04-16 NOTE — Patient Instructions (Signed)
Medication Instructions:  START: TORSEMIDE 10mg  DAILY AS NEEDED FOR SWELLING OR SHORTNESS OF BREATH  *If you need a refill on your cardiac medications before your next appointment, please call your pharmacy*  Testing/Procedures: Your physician has requested that you have a cardiac MRI. Cardiac MRI uses a computer to create images of your heart as its beating, producing both still and moving pictures of your heart and major blood vessels. For further information please visit . Please follow the instruction sheet given to you today for more information.  Follow-Up: At [facility], you and your health needs are our priority.  As part of our continuing mission to provide you with exceptional heart care, we have created designated Provider Care Teams.  These Care Teams include your primary Cardiologist (physician) and Advanced Practice Providers (APPs -  Physician Assistants and Nurse Practitioners) who all work together to provide you with the care you need, when you need it.  Your next appointment:   AFTER CARDIAC MRI  The format for your next appointment:   In Person  Provider:   [facility], MD  Other Instructions AMBULATORY REFERRAL TO ADVANCED HEART FAILURE CLINIC- SOMEONE WILL REACH OUT TO YOU TO SCHEDULE THIS.

## 2020-04-17 ENCOUNTER — Telehealth: Payer: Self-pay | Admitting: Internal Medicine

## 2020-04-17 NOTE — Telephone Encounter (Signed)
Called to discuss Friday 05/25/20 8:00 am Cardiac MRI at [facility]---arrival time is 7:30 am--1st floor admissions office--voice mail is full ---I will continue to try and reach patient by phone.---Patient does have My Chart.

## 2020-04-19 ENCOUNTER — Encounter: Payer: Self-pay | Admitting: Internal Medicine

## 2020-04-19 NOTE — Telephone Encounter (Signed)
Spoke with pateint regarding the Friday 05/25/20 8:00 am Cardiac MRI appointment at [facility]---arrival time is 7:30 am--1st floor admissions office for check in---will mail information to patient and she voiced her understanding.

## 2020-04-26 ENCOUNTER — Ambulatory Visit (INDEPENDENT_AMBULATORY_CARE_PROVIDER_SITE_OTHER): Payer: Self-pay | Admitting: Infectious Diseases

## 2020-04-26 ENCOUNTER — Encounter: Payer: Self-pay | Admitting: Infectious Diseases

## 2020-04-26 ENCOUNTER — Other Ambulatory Visit: Payer: Self-pay

## 2020-04-26 VITALS — BP 94/67 | HR 71 | Temp 97.6°F | Wt 108.0 lb

## 2020-04-26 DIAGNOSIS — F17200 Nicotine dependence, unspecified, uncomplicated: Secondary | ICD-10-CM

## 2020-04-26 DIAGNOSIS — Z5181 Encounter for therapeutic drug level monitoring: Secondary | ICD-10-CM

## 2020-04-26 DIAGNOSIS — B182 Chronic viral hepatitis C: Secondary | ICD-10-CM

## 2020-04-26 NOTE — Progress Notes (Signed)
[facility] for Infectious Diseases                                      [address], [place], [place], [address]                                               Phn. [phone]; Fax: [phone]                                                               Date: 04/25/20  Reason for Follow Up - Hepatitis C Follow Up   HPI: Amber [name] is a 30 y.o.old female with PMH of IVDU and currently on rehabilitation( on methadone/suboxone) and hepatitis C who is here for follow up for follow up on Hepatitis C.  She does not remember the exact date when she started her Dorita Fray but says she started her second bottle on April 6th. Taking Epclusa as instructed, Denies missing any doses. Denies any concerning side effects other than feeling fatigue. She has started taking Torsemide and suboxone,  No concerning side effects except for fatigue. She has not been seen by GI yet. No new complaints.   She was  admitted in the hospital 11/30/19-01/16/20 for MRSA bacteremia with septic pulmonary emboli, TV endocarditis s/p angiovac. She completed treatment with IV abx on 01/16/20 and was discharged on the same day. She has also followed up with CT SX Dr Sherlynn Stalls Foot on 2/4 where it was mentioned that " she will need to remain clean to undergo any tricuspid valve surgery in the future but at this point, there is no strong indication" She was referred to Heart failure clinic with a plan to follow up on her echocardiogram.   ROS: Denies yellowish discoloration of sclera and skin, abdominal pain/distension, hematemesis.            Denies fever, chills, nightsweats, nausea, vomiting, diarrhea, constipation, weight loss, recent hospitalizations, rashes, joint complaints, shortness of breath, chest pain, headaches, dysuria .  Past Medical History:  Diagnosis Date  . Heroin abuse (HCC)   . Kidney stones   . MRSA (methicillin resistant staph aureus) culture positive   .  Ovarian cyst   . UTI (lower urinary tract infection)    Past Surgical History:  Procedure Laterality Date  . APPLICATION OF ANGIOVAC N/A 12/05/2019   Procedure: APPLICATION OF ANGIOVAC;  Surgeon: Corliss Skains, MD;  Location: [facility] OR;  Service: Vascular;  Laterality: N/A;  Patient will need intraoperative TEE Can be performed in OR 14, 15, or 17 if OR 16 is not available  . FINGER SURGERY    . TEE WITHOUT CARDIOVERSION N/A 11/07/2019   Procedure: TRANSESOPHAGEAL ECHOCARDIOGRAM (TEE);  Surgeon: Wendall Stade, MD;  Location: [facility] ENDOSCOPY;  Service: Cardiovascular;  Laterality: N/A;  . TEE WITHOUT CARDIOVERSION  12/05/2019   Procedure: TRANSESOPHAGEAL ECHOCARDIOGRAM (TEE);  Surgeon: Corliss Skains, MD;  Location: [facility] OR;  Service: Vascular;;  . TEE WITHOUT CARDIOVERSION N/A 12/05/2019   Procedure: TRANSESOPHAGEAL ECHOCARDIOGRAM (TEE);  Surgeon: Corliss Skains, MD;  Location: [facility] OR;  Service: Thoracic;  Laterality: N/A;   Current Outpatient Medications on File Prior to Visit  Medication Sig Dispense Refill  . acetaminophen (TYLENOL) 500 MG tablet Take 1,000 mg by mouth every 6 (six) hours as needed for headache.     . buprenorphine-naloxone (SUBOXONE) 8-2 mg SUBL SL tablet Place 2 tablets under the tongue daily.    . diclofenac Sodium (VOLTAREN) 1 % GEL Apply 4 g topically 4 (four) times daily. 4 g 6  . escitalopram (LEXAPRO) 20 MG tablet Take 1 tablet (20 mg total) by mouth at bedtime. 30 tablet 6  . gabapentin (NEURONTIN) 600 MG tablet Take 0.5 tablets (300 mg total) by mouth 3 (three) times daily. 90 tablet 6  . ibuprofen (ADVIL) 600 MG tablet Take 1 tablet (600 mg total) by mouth every 6 (six) hours as needed. 30 tablet 0  . lidocaine (LIDODERM) 5 % PLACE 2 PATCHES ONTO THE SKIN DAILY. REMOVE & DISCARD PATCH WITHIN 12 HOURS OR AS DIRECTED BY MD 30 patch 0  . mirtazapine (REMERON) 15 MG tablet Take 15 mg by mouth at bedtime.    . Multiple Vitamin (MULTIVITAMIN WITH  MINERALS) TABS tablet Take 1 tablet by mouth daily. 30 tablet 6  . torsemide (DEMADEX) 10 MG tablet Take 1 tablet (10 mg total) by mouth daily as needed (AS NEEDED FOR SHORTNESS OF BREATH OR SWELLING). 30 tablet 3  . methadone (DOLOPHINE) 10 MG tablet TAKE 2 TABLETS (20 MG TOTAL) BY MOUTH EVERY TWELVE HOURS. (Patient not taking: Reported on 04/26/2020) 30 tablet 0   No current facility-administered medications on file prior to visit.     Allergies  Allergen Reactions  . Bee Venom Anaphylaxis  . Other     Bee sting   Social History   Socioeconomic History  . Marital status: Single    Spouse name: Not on file  . Number of children: Not on file  . Years of education: Not on file  . Highest education level: Not on file  Occupational History  . Not on file  Tobacco Use  . Smoking status: Former Smoker    Types: Cigarettes  . Smokeless tobacco: Never Used  Vaping Use  . Vaping Use: Never used  Substance and Sexual Activity  . Alcohol use: No  . Drug use: Yes    Types: Marijuana, IV    Comment: opiods- pain medication/heroin  . Sexual activity: Yes    Birth control/protection: None  Other Topics Concern  . Not on file  Social History Narrative  . Not on file   Social Determinants of Health   Financial Resource Strain: Not on file  Food Insecurity: Not on file  Transportation Needs: Not on file  Physical Activity: Not on file  Stress: Not on file  Social Connections: Not on file  Intimate Partner Violence: Not on file    Vitals  BP 94/67   Pulse 71   Temp 97.6 F (36.4 C) (Oral)   Wt 108 lb (49 kg)   SpO2 97%   BMI 20.41 kg/m   Gen: Alert and oriented x 3, no acute distres HEENT: [employer]/AT, PERL, no scleral icterus, no pale conjunctivae, hearing normal, oral mucosa moist, DENTAL CAVITIES IN THE LEFT LOWER MOLAR Neck: Supple, no lymphadenopathy Cardio: Regular rate and rhythm; +S1 and S2; no murmurs, gallops, or rubs Resp: CTAB; no wheezes, rhonchi, or rales GI:  Soft, nontender, nondistended, bowel sounds present GU: Musc: Extremities: No cyanosis, clubbing, or edema; +2 PT  and DP pulses Skin: No rashes, lesions, or ecchymoses Neuro: No focal deficits Psych: Calm, cooperative   Laboratory  CBC Latest Ref Rng & Units 01/15/2020 01/09/2020 01/04/2020  WBC 4.0 - 10.5 K/uL 8.1 7.6 11.4(H)  Hemoglobin 12.0 - 15.0 g/dL 7.4(Y) 8.1(K) 4.8(J)  Hematocrit 36.0 - 46.0 % 30.2(L) 31.8(L) 29.4(L)  Platelets 150 - 400 K/uL 254 289 224   CMP Latest Ref Rng & Units 01/26/2020 01/15/2020 01/09/2020  Glucose 70 - 99 mg/dL - 92 856(D)  BUN 6 - 20 mg/dL - 11 7  Creatinine 1.49 - 1.00 mg/dL - 7.02(O) 3.78  Sodium 135 - 145 mmol/L - 133(L) 135  Potassium 3.5 - 5.1 mmol/L - 4.2 4.0  Chloride 98 - 111 mmol/L - 98 100  CO2 22 - 32 mmol/L - 28 27  Calcium 8.9 - 10.3 mg/dL - 9.1 5.8(I)  Total Protein 6.5 - 8.1 g/dL - - -  Total Bilirubin 0.3 - 1.2 mg/dL - - -  Alkaline Phos 38 - 126 U/L - - -  AST 15 - 41 U/L - - -  ALT 6 - 29 U/L 20 - -    Assessment/Plan: Problem List Items Addressed This Visit      Digestive   Hepatitis C, chronic (HCC) - Primary   Relevant Orders   Hepatitis C RNA quantitative   Ambulatory referral to Gastroenterology    Other Visit Diagnoses    Medication monitoring encounter         Hepatitis C in the setting or prior IVDU Prior treatment: none  GT: 1a  Evidence of cirrhosis: yes, on Ultrasound. However, Fibrotest was F0 and Fib 4 score was 1.18 Interested in treatment: yes Potential DDI: none   Continue Epclusa for 12 weeks total  Referral to GI to r/o varices given cirrhosis seen in [name] Discussed with her about screening US for [facility] every 6 months, next due in end of July and August 2022 Fu in 5-6 months for checking HCV VL for SVR    Smoking Counseled   Immunization Immune to Hepatitis A and B  Counseling done on the following -Natural progression of hep c, transmission (avoid sharing personal hygiene equipment),  prevention, risks of left untreated and treatment options  -Avoid hepatotoxins like alcohol and excessive acetamaminphen (no more than 2 gram a day) -Avoid eating raw sea food -Risks of re-infection  -Hepatitis coinfection and vaccination( Pneumococcal vaccination in the cirrhotics  - HCC screening with [name] every 6 months - EGD to r/o varices in cirrhotics  I spent 30 minutes with the patient including review of prior medical records with greater than 50% of time in face to face counsel of the patient.   Patient's labs were reviewed as well as his previous records. Patients questions were addressed and answered.   Electronically signed by:  Odette Fraction, MD Infectious Diseases  Office phone [phone] Fax no. [phone]

## 2020-04-28 LAB — HEPATITIS C RNA QUANTITATIVE
HCV Quantitative Log: <1.18 log IU/mL
HCV RNA, PCR, QN: <15 IU/mL

## 2020-05-23 ENCOUNTER — Telehealth (HOSPITAL_COMMUNITY): Payer: Self-pay | Admitting: Emergency Medicine

## 2020-05-23 NOTE — Telephone Encounter (Signed)
Reaching out to patient to offer assistance regarding upcoming cardiac imaging study; pt verbalizes understanding of appt date/time, parking situation and where to check in, pre-test NPO status and medications ordered, and verified current allergies; name and call back number provided for further questions should they arise Amber Alexandria RN Navigator Cardiac Imaging [facility] [phone] office [phone] cell  Spoke with patients mother: Dermal piercing (titanium) -center chest  R leg rod/screws from [facility]  Not claustro, but anxiety  Amber [name]

## 2020-05-25 ENCOUNTER — Other Ambulatory Visit: Payer: Self-pay

## 2020-05-25 ENCOUNTER — Ambulatory Visit (HOSPITAL_COMMUNITY)
Admission: RE | Admit: 2020-05-25 | Discharge: 2020-05-25 | Disposition: A | Discharge status: Home / Self Care | Payer: Self-pay | Source: Ambulatory Visit | Attending: Internal Medicine | Admitting: Internal Medicine

## 2020-05-25 DIAGNOSIS — I50810 Right heart failure, unspecified: Secondary | ICD-10-CM | POA: Insufficient documentation

## 2020-05-25 IMAGING — MR MR CARD MORPHOLOGY WO/W CM
44 of 48 series · 44 of 48 positions shown · IV contrast (gadavist)
Comparison: none

CLINICAL DATA: 30F with IVDA, tricuspid valve endocarditis

EXAM:
CARDIAC MRI
TECHNIQUE: The patient was scanned on a 1.5 Tesla Siemens magnet. A dedicated
cardiac coil was used. Functional imaging was done using Fiesta
sequences. [DATE], and 4 chamber views were done to assess for RWMA's.
Modified BERILL rule using a short axis stack was used to
calculate an ejection fraction on a dedicated work station using
Circle software. The patient received 5 cc of Gadavist. After 10
minutes inversion recovery sequences were used to assess for
infiltration and scar tissue.
CONTRAST:  5 cc  of Gadavist

[Series 4: [id number] · axial · 8.0mm · 1.41mm/px · 1 of 18 slices shown]
[im 1/18]
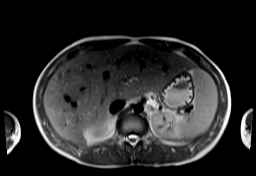

[Series 9: bSSFP · oblique · 8.0mm · 1.61mm/px · 1 of 25 slices shown (1 of 18)]
[im 1/25]
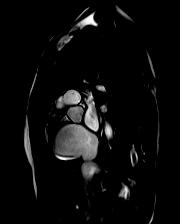

[Series 10: bSSFP · oblique · 8.0mm · 1.61mm/px · 1 of 25 slices shown (2 of 18)]
[im 1/25]
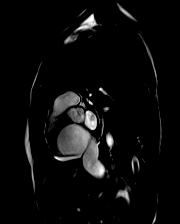

[Series 11: bSSFP · oblique · 8.0mm · 1.61mm/px · 1 of 25 slices shown (3 of 18)]
[im 1/25]
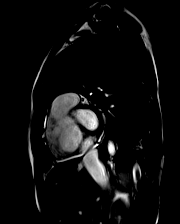

[Series 12: bSSFP · oblique · 8.0mm · 1.61mm/px · 1 of 25 slices shown (4 of 18)]
[im 1/25]
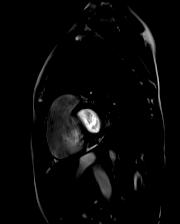

[Series 13: bSSFP · oblique · 8.0mm · 1.61mm/px · 1 of 25 slices shown (5 of 18)]
[im 1/25]
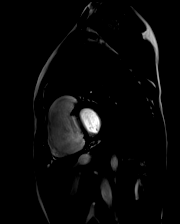

[Series 14: bSSFP · oblique · 8.0mm · 1.61mm/px · 1 of 25 slices shown (6 of 18)]
[im 1/25]
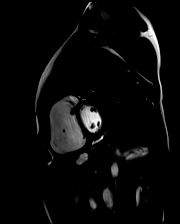

[Series 15: bSSFP · oblique · 8.0mm · 1.61mm/px · 1 of 25 slices shown (7 of 18)]
[im 1/25]
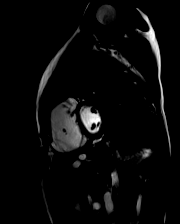

[Series 16: bSSFP · oblique · 8.0mm · 1.61mm/px · 1 of 25 slices shown (8 of 18)]
[im 1/25]
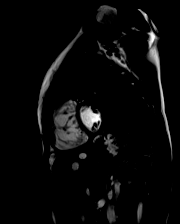

[Series 17: bSSFP · oblique · 8.0mm · 1.61mm/px · 1 of 25 slices shown (9 of 18)]
[im 1/25]
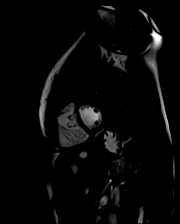

[Series 18: bSSFP · oblique · 8.0mm · 1.61mm/px · 1 of 25 slices shown (10 of 18)]
[im 1/25]
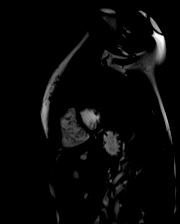

[Series 19: bSSFP · oblique · 8.0mm · 1.61mm/px · 1 of 25 slices shown (11 of 18)]
[im 1/25]
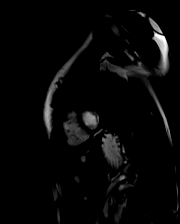

[Series 20: bSSFP · oblique · 8.0mm · 1.61mm/px · 1 of 25 slices shown (12 of 18)]
[im 1/25]
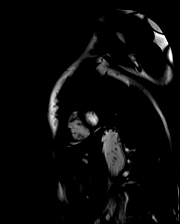

[Series 21: bSSFP · oblique · 8.0mm · 1.61mm/px · 1 of 25 slices shown (13 of 18)]
[im 1/25]
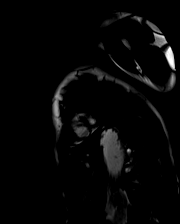

[Series 22: bSSFP · oblique · 8.0mm · 1.61mm/px · 1 of 25 slices shown (14 of 18)]
[im 1/25]
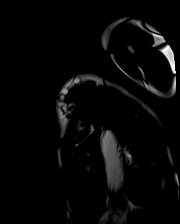

[Series 23: bSSFP · oblique · 8.0mm · 1.61mm/px · 1 of 25 slices shown (15 of 18)]
[im 1/25]
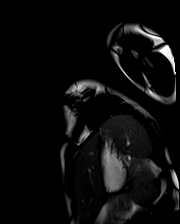

[Series 24: STIR · oblique · 8.0mm · 1.92mm/px · 1 of 17 slices shown]
[im 1/17]
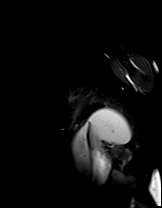

[Series 25: bSSFP · oblique · 6.0mm · 1.41mm/px · 1 of 25 slices shown (16 of 18)]
[im 1/25]
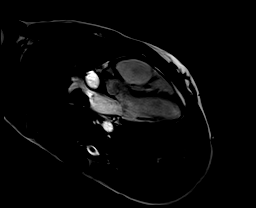

[Series 26: bSSFP · coronal · 6.0mm · 1.41mm/px · 1 of 25 slices shown (17 of 18)]
[im 1/25]
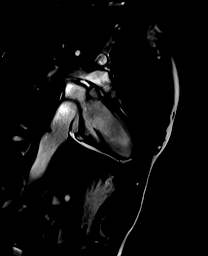

[Series 27: bSSFP · oblique · 6.0mm · 1.41mm/px · 1 of 25 slices shown (18 of 18)]
[im 1/25]
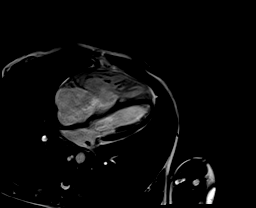

[Series 28: 4ch stack · oblique · 8.0mm · 2.73mm/px · 1 of 6 slices shown (1 of 8)]
[im 1/6]
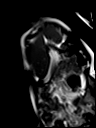

[Series 28: 4ch stack · oblique · 8.0mm · 2.73mm/px · 1 of 20 slices shown (2 of 8)]
[im 1/20]
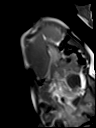

[Series 28: 4ch stack · oblique · 8.0mm · 2.73mm/px · 1 of 20 slices shown (3 of 8)]
[im 1/20]
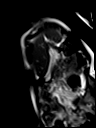

[Series 28: 4ch stack · oblique · 8.0mm · 2.73mm/px · 1 of 20 slices shown (4 of 8)]
[im 1/20]
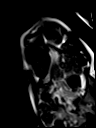

[Series 28: 4ch stack · oblique · 8.0mm · 2.73mm/px · 1 of 20 slices shown (5 of 8)]
[im 1/20]
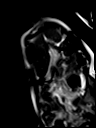

[Series 29: cine_trufi_cs_rt_short axis · oblique · 8.0mm · 1.73mm/px · 1 of 49 slices shown (1 of 16)]
[im 1/49]
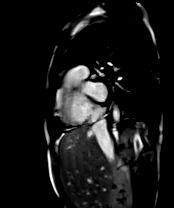

[Series 29: cine_trufi_cs_rt_short axis · oblique · 8.0mm · 1.73mm/px · 1 of 49 slices shown (2 of 16)]
[im 1/49]
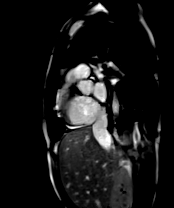

[Series 29: cine_trufi_cs_rt_short axis · oblique · 8.0mm · 1.73mm/px · 1 of 49 slices shown (3 of 16)]
[im 1/49]
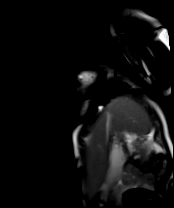

[Series 29: cine_trufi_cs_rt_short axis · oblique · 8.0mm · 1.73mm/px · 1 of 49 slices shown (4 of 16)]
[im 1/49]
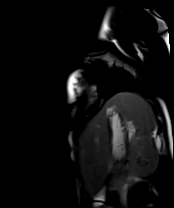

[Series 29: cine_trufi_cs_rt_short axis · oblique · 8.0mm · 1.73mm/px · 1 of 49 slices shown (5 of 16)]
[im 1/49]
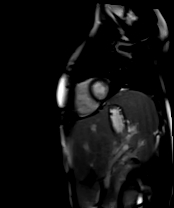

[Series 29: cine_trufi_cs_rt_short axis · oblique · 8.0mm · 1.73mm/px · 1 of 49 slices shown (6 of 16)]
[im 1/49]
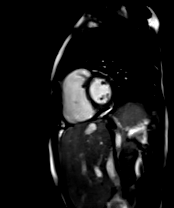

[Series 29: cine_trufi_cs_rt_short axis · oblique · 8.0mm · 1.73mm/px · 1 of 49 slices shown (7 of 16)]
[im 1/49]
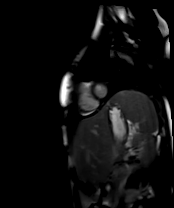

[Series 29: cine_trufi_cs_rt_short axis · oblique · 8.0mm · 1.73mm/px · 1 of 49 slices shown (8 of 16)]
[im 1/49]
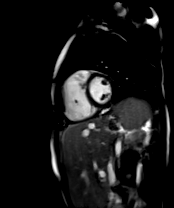

[Series 29: cine_trufi_cs_rt_short axis · oblique · 8.0mm · 1.73mm/px · 1 of 49 slices shown (9 of 16)]
[im 1/49]
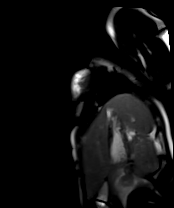

[Series 29: cine_trufi_cs_rt_short axis · oblique · 8.0mm · 1.73mm/px · 1 of 49 slices shown (10 of 16)]
[im 1/49]
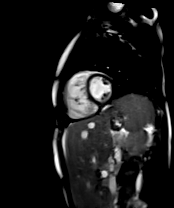

[Series 29: cine_trufi_cs_rt_short axis · oblique · 8.0mm · 1.73mm/px · 1 of 49 slices shown (11 of 16)]
[im 1/49]
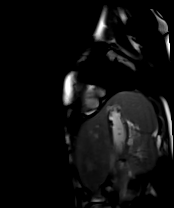

[Series 29: cine_trufi_cs_rt_short axis · oblique · 8.0mm · 1.73mm/px · 1 of 49 slices shown (12 of 16)]
[im 1/49]
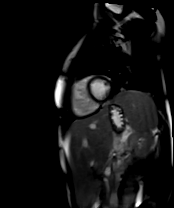

[Series 29: cine_trufi_cs_rt_short axis · oblique · 8.0mm · 1.73mm/px · 1 of 49 slices shown (13 of 16)]
[im 1/49]
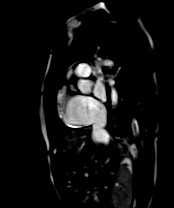

[Series 29: cine_trufi_cs_rt_short axis · oblique · 8.0mm · 1.73mm/px · 1 of 49 slices shown (14 of 16)]
[im 1/49]
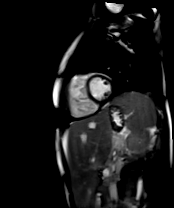

[Series 29: cine_trufi_cs_rt_short axis · oblique · 8.0mm · 1.73mm/px · 1 of 49 slices shown (15 of 16)]
[im 1/49]
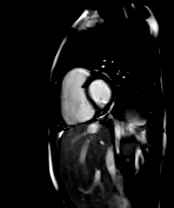

[Series 29: cine_trufi_cs_rt_short axis · oblique · 8.0mm · 1.73mm/px · 1 of 49 slices shown (16 of 16)]
[im 1/49]
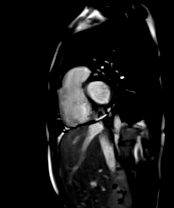

[Series 30: 4ch stack · oblique · 6.0mm · 1.61mm/px · 1 of 25 slices shown (6 of 8)]
[im 1/25]
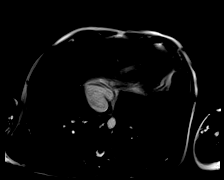

[Series 30: 4ch stack · oblique · 6.0mm · 1.61mm/px · 1 of 25 slices shown (7 of 8)]
[im 1/25]
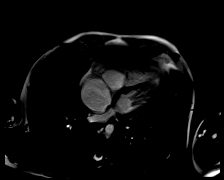

[Series 30: 4ch stack · oblique · 6.0mm · 1.61mm/px · 1 of 25 slices shown (8 of 8)]
[im 1/25]
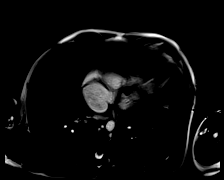

[44 of 48 positions shown; findings below may reference images not displayed]

FINDINGS: Left ventricle:

-Normal size

-Normal systolic function

-Septal flattening during diastole consistent with RV volume
overload

-Nonspecific elevation in ECV (33%, assuming Hct 40%)

-RV insertion site LGE

LV EF:  57% (Normal 56-78%)

Absolute volumes:

LV EDV: 86mL (Normal 52-141 mL)

LV ESV: 37mL (Normal 13-51 mL)

LV SV: 49mL (Normal 33-97 mL)

CO: 2.9L/min (Normal 2.7-6.0 L/min)

Indexed volumes:

LV EDV: 59mL/sq-m (Normal 41-81 mL/sq-m)

LV ESV: 25mL/sq-m (Normal 12-21 mL/sq-m)

LV SV: 34mL/sq-m (Normal 26-56 mL/sq-m)

CI: 2.0L/min/sq-m (Normal 1.8-3.8 L/min/sq-m)

Right ventricle:

-Severe dilatation

-Mild systolic dysfunction

RV EF: 45% (Normal 47-80%)

Absolute volumes:

RV EDV: 251mL (Normal 58-154 mL)

RV ESV: 138mL (Normal 12-68 mL)

RV SV: 112mL (Normal 35-98 mL)

CO: 6.8L/min (Normal 2.7-6 L/min)

Indexed volumes:

RV EDV: 173mL/sq-m (Normal 48-87 mL/sq-m)

RV ESV: 95mL/sq-m (Normal 11-28 mL/sq-m)

RV SV: 77mL/sq-m (Normal 27-57 mL/sq-m)

CI: 4.7L/min/sq-m (Normal 1.8-3.8 L/min/sq-m)

Left atrium: Normal size

Right atrium: Moderate enlargement

Mitral valve: No regurgitation

Aortic valve: No regurgitation

Tricuspid valve: Severe regurgitation (regurgitant fraction 62%)

Pulmonic valve: No regurgitation

Aorta: Normal proximal ascending aorta

Pericardium: Small effusion
IMPRESSION: 1. Severe RV dilatation (RVEDVI 173 mL/m^2) with mild systolic
dysfunction (EF 45%)

2.  Severe tricuspid regurgitation (regurgitant fraction 62%)

3. Normal LV size and systolic function (EF 57%). Septal flattening
during diastole consistent with RV volume overload

4. RV insertion site late gadolinium enhancement, which is a
nonspecific finding often seen in setting of elevated pulmonary
pressures

## 2020-05-25 MED ORDER — GADOBUTROL 1 MMOL/ML IV SOLN
5.0000 mL | Freq: Once | INTRAVENOUS | Status: AC | PRN
  Administered 2020-05-25: 5 mL via INTRAVENOUS

## 2020-06-11 ENCOUNTER — Emergency Department (HOSPITAL_COMMUNITY)
Admission: EM | Admit: 2020-06-11 | Discharge: 2020-06-11 | Disposition: A | Discharge status: Home / Self Care | Payer: Medicaid Other | Attending: Emergency Medicine | Admitting: Emergency Medicine

## 2020-06-11 ENCOUNTER — Encounter (HOSPITAL_COMMUNITY): Payer: Self-pay | Admitting: *Deleted

## 2020-06-11 ENCOUNTER — Other Ambulatory Visit: Payer: Self-pay

## 2020-06-11 DIAGNOSIS — L02415 Cutaneous abscess of right lower limb: Secondary | ICD-10-CM | POA: Insufficient documentation

## 2020-06-11 DIAGNOSIS — L0291 Cutaneous abscess, unspecified: Secondary | ICD-10-CM

## 2020-06-11 DIAGNOSIS — F17210 Nicotine dependence, cigarettes, uncomplicated: Secondary | ICD-10-CM | POA: Insufficient documentation

## 2020-06-11 MED ORDER — POVIDONE-IODINE 10 % EX SOLN
CUTANEOUS | Status: AC
  Filled 2020-06-11: qty 15

## 2020-06-11 MED ORDER — LIDOCAINE HCL (PF) 1 % IJ SOLN: 5.0000 mL | Freq: Once | INTRAMUSCULAR | Status: AC

## 2020-06-11 MED ORDER — LIDOCAINE HCL (PF) 1 % IJ SOLN
INTRAMUSCULAR | Status: AC
  Administered 2020-06-11: 5 mL
  Filled 2020-06-11: qty 5

## 2020-06-11 MED ORDER — DOXYCYCLINE HYCLATE 100 MG PO CAPS: 100.0000 mg | ORAL_CAPSULE | Freq: Two times a day (BID) | ORAL | 0 refills | Status: AC

## 2020-06-11 NOTE — ED Provider Notes (Signed)
[facility] EMERGENCY DEPARTMENT Provider Note   CSN: [id number] Arrival date & time: 06/11/20  1138     History Chief Complaint  Patient presents with  . Knee Pain    Amber [name] is a 30 y.o. female.  HPI   Patient with significant medical history of polysubstance abuse, bacteremia secondary due to MRSA, endocarditis, presents with chief complaint of Right lateral aspect of knee.  Patient states she noticed this approximately one week ago and has progressively gotten larger, she states the area is slightly more red and is painful when she ambulates, not tender to th touches, she denies active drainage or discharge, she denies recent trauma to the area, states that she has had an abscess in the past but does not feel like a abscess.   She denies recent IV drug use, she denies paresthesia or weakness in the lower extremities, she denies chest pain or shortness of breath.  Patient is not currently on antibiotics at this time.  She denies alleviating factors.  Patient denies headaches, fevers, chills, shortness of breath, chest pain, abdominal pain, nausea, vomiting, diarrhea.  Past Medical History:  Diagnosis Date  . Heroin abuse (HCC)   . Kidney stones   . MRSA (methicillin resistant staph aureus) culture positive   . Ovarian cyst   . UTI (lower urinary tract infection)     Patient Active Problem List   Diagnosis Date Noted  . Immunization counseling 01/26/2020  . IV infiltrate, subsequent encounter 01/16/2020  . Septic pulmonary embolism without acute cor pulmonale (HCC) 01/16/2020  . Hydropneumothorax 01/16/2020  . Severe tricuspid regurgitation 01/16/2020  . GAD (generalized anxiety disorder) 01/16/2020  . Hepatitis C, chronic (HCC) 01/16/2020  . Anemia, chronic disease 01/16/2020  . Protein-calorie malnutrition, severe 12/27/2019  . MRSA infection 12/06/2019  . Opioid use with withdrawal (HCC)   . Endocarditis of tricuspid valve   . Palliative care encounter   .  Septic shock (HCC) 11/30/2019  . Septic embolism (HCC) 11/05/2019  . AKI (acute kidney injury) (HCC) 11/05/2019  . Hyponatremia 11/05/2019  . Cellulitis of left foot 11/05/2019  . 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 Bacterial endocarditis 11/05/2019  . Acute bacterial endocarditis   . MRSA bacteremia   . Sepsis (HCC) 11/03/2019  . Abscess of face 08/20/2018  . Facial cellulitis 11/21/2017  . Depression   . IV drug abuse (HCC)   . Suicidal ideation     Past Surgical History:  Procedure Laterality Date  . APPLICATION OF ANGIOVAC N/A 12/05/2019   Procedure: APPLICATION OF ANGIOVAC;  Surgeon: Corliss Skains, MD;  Location: [facility] OR;  Service: Vascular;  Laterality: N/A;  Patient will need intraoperative TEE Can be performed in OR 14, 15, or 17 if OR 16 is not available  . FINGER SURGERY    . TEE WITHOUT CARDIOVERSION N/A 11/07/2019   Procedure: TRANSESOPHAGEAL ECHOCARDIOGRAM (TEE);  Surgeon: Wendall Stade, MD;  Location: [facility] ENDOSCOPY;  Service: Cardiovascular;  Laterality: N/A;  . TEE WITHOUT CARDIOVERSION  12/05/2019   Procedure: TRANSESOPHAGEAL ECHOCARDIOGRAM (TEE);  Surgeon: Corliss Skains, MD;  Location: [facility] OR;  Service: Vascular;;  . TEE WITHOUT CARDIOVERSION N/A 12/05/2019   Procedure: TRANSESOPHAGEAL ECHOCARDIOGRAM (TEE);  Surgeon: Corliss Skains, MD;  Location: [facility] OR;  Service: Thoracic;  Laterality: N/A;     OB History   No obstetric history on file.     Family History  Problem Relation Age of Onset  . Valvular heart disease Maternal Grandmother  Social History   Tobacco Use  . Smoking status: Current Every Day Smoker    Types: Cigarettes  . Smokeless tobacco: Never Used  Vaping Use  . Vaping Use: Never used  Substance Use Topics  . Alcohol use: No  . Drug use: Yes    Types: Marijuana, IV    Comment: opiods- pain medication/heroin    Home Medications Prior to Admission medications   Medication Sig Start Date End Date Taking?  Authorizing Provider  acetaminophen (TYLENOL) 500 MG tablet Take 1,000 mg by mouth every 6 (six) hours as needed for headache.    Yes [provider]  buprenorphine-naloxone (SUBOXONE) 8-2 mg SUBL SL tablet Place 2 tablets under the tongue daily.   Yes [provider]  diclofenac Sodium (VOLTAREN) 1 % GEL Apply 4 g topically 4 (four) times daily. 01/16/20  Yes Russella DarEllis, Allison L, NP  doxycycline (VIBRAMYCIN) 100 MG capsule Take 1 capsule (100 mg total) by mouth 2 (two) times daily for 7 days. 06/11/20 06/18/20 Yes Carroll SageFaulkner, Adriana Quinby J, PA-C  escitalopram (LEXAPRO) 20 MG tablet Take 1 tablet (20 mg total) by mouth at bedtime. 01/11/20  Yes Russella DarEllis, Allison L, NP  gabapentin (NEURONTIN) 600 MG tablet Take 0.5 tablets (300 mg total) by mouth 3 (three) times daily. 01/11/20  Yes Russella DarEllis, Allison L, NP  ibuprofen (ADVIL) 600 MG tablet Take 1 tablet (600 mg total) by mouth every 6 (six) hours as needed. 08/20/18  Yes Fayrene Helperran, Bowie, PA-C  lidocaine (LIDODERM) 5 % PLACE 2 PATCHES ONTO THE SKIN DAILY. REMOVE & DISCARD PATCH WITHIN 12 HOURS OR AS DIRECTED BY MD Patient taking differently: Place 2 patches onto the skin daily. 01/16/20 01/15/21 Yes Russella DarEllis, Allison L, NP  mirtazapine (REMERON) 15 MG tablet Take 15 mg by mouth daily as needed (bad day). 03/27/20  Yes [provider]  Multiple Vitamin (MULTIVITAMIN WITH MINERALS) TABS tablet Take 1 tablet by mouth daily. 01/12/20  Yes Russella DarEllis, Allison L, NP  torsemide (DEMADEX) 10 MG tablet Take 1 tablet (10 mg total) by mouth daily as needed (AS NEEDED FOR SHORTNESS OF BREATH OR SWELLING). 04/16/20  Yes Parke PoissonAcharya, Gayatri A, MD    Allergies    Bee venom and Other  Review of Systems   Review of Systems  Constitutional: Negative for chills and fever.  HENT: Negative for congestion.   Respiratory: Negative for shortness of breath.   Cardiovascular: Negative for chest pain.  Gastrointestinal: Negative for abdominal pain.  Genitourinary: Negative for enuresis.   Musculoskeletal: Negative for back pain.  Skin: Negative for rash.       Patient has a boil on the right lower leg.  Neurological: Negative for dizziness.  Hematological: Does not bruise/bleed easily.    Physical Exam Updated Vital Signs BP 111/65 (BP Location: Right Arm)   Pulse 79   Temp 98 F (36.7 C) (Oral)   Resp 18   Ht 5\' 1"  (1.549 m)   Wt 48.5 kg   SpO2 97%   BMI 20.22 kg/m   Physical Exam Vitals and nursing note reviewed.  Constitutional:      General: She is not in acute distress.    Appearance: She is not ill-appearing.  HENT:     Head: Normocephalic and atraumatic.     Nose: No congestion.  Eyes:     Conjunctiva/sclera: Conjunctivae normal.  Cardiovascular:     Rate and Rhythm: Normal rate and regular rhythm.     Pulses: Normal pulses.     Heart sounds:  No murmur heard. No friction rub. No gallop.   Pulmonary:     Effort: No respiratory distress.     Breath sounds: No wheezing, rhonchi or rales.  Musculoskeletal:     Comments: Lower extremities were visualized on patient's right lower leg there is a noted surgical scar on the lateral aspect crossing the knee, at the distal end of the scar at the proximal end of the fibula there is a large fluctuant papule, without significant surrounding erythema, no induration present, not warm to the touch, nontender to palpation.  Neurovascular intact in lower extremities.  Skin:    General: Skin is warm and dry.  Neurological:     Mental Status: She is alert.  Psychiatric:        Mood and Affect: Mood normal.     ED Results / Procedures / Treatments   Labs (all labs ordered are listed, but only abnormal results are displayed) Labs Reviewed - No data to display  EKG None  Radiology No results found.  Procedures .Marland KitchenIncision and Drainage  Date/Time: 06/11/2020 2:31 PM Performed by: Carroll Sage, PA-C Authorized by: Carroll Sage, PA-C   Consent:    Consent obtained:  Verbal   Consent given  by:  Patient   Risks discussed:  Bleeding, incomplete drainage, pain, damage to other organs and infection   Alternatives discussed:  No treatment Universal protocol:    Patient identity confirmed:  Verbally with patient Location:    Type:  Abscess   Size:  3cm   Location:  Lower extremity   Lower extremity location:  Leg   Leg location:  R lower leg Pre-procedure details:    Skin preparation:  Povidone-iodine Sedation:    Sedation type:  None Anesthesia:    Anesthesia method:  Local infiltration   Local anesthetic:  Lidocaine 1% w/o epi Procedure type:    Complexity:  Complex Procedure details:    Ultrasound guidance: no     Incision types:  Single straight   Incision depth:  Dermal   Wound management:  Probed and deloculated   Drainage:  Bloody and purulent   Drainage amount:  Moderate   Wound treatment:  Wound left open   Packing materials:  1/4 in iodoform gauze   Amount 1/4" iodoform:  4cm Post-procedure details:    Procedure completion:  Tolerated well, no immediate complications     Medications Ordered in ED Medications  povidone-iodine (BETADINE) 10 % external solution (has no administration in time range)  lidocaine (PF) (XYLOCAINE) 1 % injection 5 mL (5 mLs Infiltration Given 06/11/20 1404)    ED Course  I have reviewed the triage vital signs and the nursing notes.  Pertinent labs & imaging results that were available during my care of the patient were reviewed by me and considered in my medical decision making (see chart for details).    MDM Rules/Calculators/A&P                         Initial impression-patient presents with boil on her right lower leg.  She is alert, does not appear in acute stress, vital signs reassuring.  Patient is up-to-date on her tetanus shot, will recommend I&D for further evaluation.  Work-up-due to well-appearing patient, benign physical exam, further lab or imaging ordered at this time.  Reassessment-patient was agreeable to  I&D. Patient with skin abscess  located right lower leg, amenable to incision and drainage.  Patient tolerated the procedure well.  Wound was large enough to warrant packing, will start patient on antibiotics due to her history of MRSA infections.    Rule out-low suspicion for cellulitis as there was not a significant amount of surrounding erythema on exam.  Low suspicion for deep tissue infection as there is no fluctuance or indurations present after I&D. Low suspicion for systemic infection as patient nontoxic-appearing, vital signs reassuring.   Plan-  1.  Boil on right leg-suspect it was a abscess, area was successfully I&D, packing placed, will start patient antibiotics.  Have her follow-up in next 2 to 3 days for packing removal and wound assessment.  Vital signs have remained stable, no indication for hospital admission.  Patient discussed with attending and they agreed with assessment and plan.  Patient given at home care as well strict return precautions.  Patient verbalized that they understood agreed to said plan.   Final Clinical Impression(s) / ED Diagnoses Final diagnoses:  Abscess    Rx / DC Orders ED Discharge Orders         Ordered    doxycycline (VIBRAMYCIN) 100 MG capsule  2 times daily        06/11/20 1424           Barnie Del 06/11/20 1433    Long, Arlyss Repress, MD 06/14/20 [phone]

## 2020-06-11 NOTE — ED Triage Notes (Signed)
Red raised area right knee

## 2020-06-11 NOTE — Discharge Instructions (Addendum)
I suspect you had an abscess on your right lower leg.  I have placed packing in it please leave packing in.  I have started you on antibiotics please take as prescribed.  I would like you to apply warm compresses to the area 2-3 times daily for the next 7 days.  For pain you may take over-the-counter pain medication like ibuprofen and or Tylenol every 6 hours as needed.  Please come back to the emergency department, urgent care, your PCP in the next 2 to 3 days for a wound check.  Come back to the emergency department if you develop chest pain, shortness of breath, severe abdominal pain, uncontrolled nausea, vomiting, diarrhea.
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
## 2020-06-14 ENCOUNTER — Other Ambulatory Visit: Payer: Self-pay

## 2020-06-14 ENCOUNTER — Emergency Department (HOSPITAL_COMMUNITY)
Admission: EM | Admit: 2020-06-14 | Discharge: 2020-06-14 | Disposition: A | Discharge status: Home / Self Care | Payer: Medicaid Other | Attending: Emergency Medicine | Admitting: Emergency Medicine

## 2020-06-14 ENCOUNTER — Encounter (HOSPITAL_COMMUNITY): Payer: Self-pay

## 2020-06-14 DIAGNOSIS — F17210 Nicotine dependence, cigarettes, uncomplicated: Secondary | ICD-10-CM | POA: Insufficient documentation

## 2020-06-14 DIAGNOSIS — Z5189 Encounter for other specified aftercare: Secondary | ICD-10-CM

## 2020-06-14 DIAGNOSIS — Z4800 Encounter for change or removal of nonsurgical wound dressing: Secondary | ICD-10-CM | POA: Insufficient documentation

## 2020-06-14 NOTE — ED Triage Notes (Signed)
Pt here for recheck of abcess on right leg.

## 2020-06-14 NOTE — ED Notes (Signed)
Area cleaned and dressed 

## 2020-06-14 NOTE — ED Provider Notes (Signed)
[facility] EMERGENCY DEPARTMENT Provider Note   CSN: [id number] Arrival date & time: 06/14/20  0854     History Chief Complaint  Patient presents with  . Wound Check    Amber [name] is a 30 y.o. female.  HPI   Patient with significant medical history of polysubstance abuse, bacteremia secondary due to MRSA, endocarditis presents to the emergency department for a wound check.  Patient seen in the emergency department on 05/30 for abscess of her right lower leg.  Abscess was drained and purulent discharge was expelled, packing was in place and started on doxycycline.   Patient states her leg feels much better, she states she has been tolerating the antibiotics Difficulty, states that she has some drainage coming from the wound but overall feels better.  She denies systemic infection fevers or chills, denies worsening redness, swelling or pain within the area.  Patient has no other complaints at this time.  Patient denies headaches, fevers, chills, shortness breath, chest pain, abdominal pain.  Past Medical History:  Diagnosis Date  . Heroin abuse (HCC)   . Kidney stones   . MRSA (methicillin resistant staph aureus) culture positive   . Ovarian cyst   . UTI (lower urinary tract infection)     Patient Active Problem List   Diagnosis Date Noted  . Immunization counseling 01/26/2020  . IV infiltrate, subsequent encounter 01/16/2020  . Septic pulmonary embolism without acute cor pulmonale (HCC) 01/16/2020  . Hydropneumothorax 01/16/2020  . Severe tricuspid regurgitation 01/16/2020  . GAD (generalized anxiety disorder) 01/16/2020  . Hepatitis C, chronic (HCC) 01/16/2020  . Anemia, chronic disease 01/16/2020  . Protein-calorie malnutrition, severe 12/27/2019  . MRSA infection 12/06/2019  . Opioid use with withdrawal (HCC)   . Endocarditis of tricuspid valve   . Palliative care encounter   . Septic shock (HCC) 11/30/2019  . Septic embolism (HCC) 11/05/2019  . AKI (acute kidney  injury) (HCC) 11/05/2019  . Hyponatremia 11/05/2019  . Cellulitis of left foot 11/05/2019  . 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 Bacterial endocarditis 11/05/2019  . Acute bacterial endocarditis   . MRSA bacteremia   . Sepsis (HCC) 11/03/2019  . Abscess of face 08/20/2018  . Facial cellulitis 11/21/2017  . Depression   . IV drug abuse (HCC)   . Suicidal ideation     Past Surgical History:  Procedure Laterality Date  . APPLICATION OF ANGIOVAC N/A 12/05/2019   Procedure: APPLICATION OF ANGIOVAC;  Surgeon: Corliss Skains, MD;  Location: [facility] OR;  Service: Vascular;  Laterality: N/A;  Patient will need intraoperative TEE Can be performed in OR 14, 15, or 17 if OR 16 is not available  . FINGER SURGERY    . TEE WITHOUT CARDIOVERSION N/A 11/07/2019   Procedure: TRANSESOPHAGEAL ECHOCARDIOGRAM (TEE);  Surgeon: Wendall Stade, MD;  Location: [facility] ENDOSCOPY;  Service: Cardiovascular;  Laterality: N/A;  . TEE WITHOUT CARDIOVERSION  12/05/2019   Procedure: TRANSESOPHAGEAL ECHOCARDIOGRAM (TEE);  Surgeon: Corliss Skains, MD;  Location: [facility] OR;  Service: Vascular;;  . TEE WITHOUT CARDIOVERSION N/A 12/05/2019   Procedure: TRANSESOPHAGEAL ECHOCARDIOGRAM (TEE);  Surgeon: Corliss Skains, MD;  Location: [facility] OR;  Service: Thoracic;  Laterality: N/A;     OB History   No obstetric history on file.     Family History  Problem Relation Age of Onset  . Valvular heart disease Maternal Grandmother     Social History   Tobacco Use  . Smoking status: Current Every Day Smoker  Types: Cigarettes  . Smokeless tobacco: Never Used  Vaping Use  . Vaping Use: Never used  Substance Use Topics  . Alcohol use: No  . Drug use: Yes    Types: Marijuana, IV    Comment: opiods- pain medication/heroin    Home Medications Prior to Admission medications   Medication Sig Start Date End Date Taking? Authorizing Provider  acetaminophen (TYLENOL) 500 MG tablet Take 1,000 mg by mouth every 6 (six)  hours as needed for headache.     [provider]  buprenorphine-naloxone (SUBOXONE) 8-2 mg SUBL SL tablet Place 2 tablets under the tongue daily.    [provider]  diclofenac Sodium (VOLTAREN) 1 % GEL Apply 4 g topically 4 (four) times daily. 01/16/20   Russella Dar, NP  doxycycline (VIBRAMYCIN) 100 MG capsule Take 1 capsule (100 mg total) by mouth 2 (two) times daily for 7 days. 06/11/20 06/18/20  Carroll Sage, PA-C  escitalopram (LEXAPRO) 20 MG tablet Take 1 tablet (20 mg total) by mouth at bedtime. 01/11/20   Russella Dar, NP  gabapentin (NEURONTIN) 600 MG tablet Take 0.5 tablets (300 mg total) by mouth 3 (three) times daily. 01/11/20   Russella Dar, NP  ibuprofen (ADVIL) 600 MG tablet Take 1 tablet (600 mg total) by mouth every 6 (six) hours as needed. 08/20/18   Fayrene Helper, PA-C  lidocaine (LIDODERM) 5 % PLACE 2 PATCHES ONTO THE SKIN DAILY. REMOVE & DISCARD PATCH WITHIN 12 HOURS OR AS DIRECTED BY MD Patient taking differently: Place 2 patches onto the skin daily. 01/16/20 01/15/21  Russella Dar, NP  mirtazapine (REMERON) 15 MG tablet Take 15 mg by mouth daily as needed (bad day). 03/27/20   [provider]  Multiple Vitamin (MULTIVITAMIN WITH MINERALS) TABS tablet Take 1 tablet by mouth daily. 01/12/20   Russella Dar, NP  torsemide (DEMADEX) 10 MG tablet Take 1 tablet (10 mg total) by mouth daily as needed (AS NEEDED FOR SHORTNESS OF BREATH OR SWELLING). 04/16/20   Parke Poisson, MD    Allergies    Bee venom and Other  Review of Systems   Review of Systems  Constitutional: Negative for chills and fever.  HENT: Negative for congestion.   Respiratory: Negative for shortness of breath.   Cardiovascular: Negative for chest pain.  Gastrointestinal: Negative for abdominal pain.  Genitourinary: Negative for enuresis.  Musculoskeletal: Negative for back pain.  Skin: Positive for wound. Negative for rash.       Abscess of right lower leg   Neurological: Negative for dizziness.  Hematological: Does not bruise/bleed easily.    Physical Exam Updated Vital Signs BP 100/67   Pulse 65   Temp 97.8 F (36.6 C) (Oral)   Ht 5\' 1"  (1.549 m)   Wt 48.5 kg   SpO2 99%   BMI 20.22 kg/m   Physical Exam Vitals and nursing note reviewed.  Constitutional:      General: She is not in acute distress.    Appearance: Normal appearance. She is not ill-appearing or diaphoretic.  HENT:     Head: Normocephalic and atraumatic.     Nose: No congestion.  Eyes:     Conjunctiva/sclera: Conjunctivae normal.  Cardiovascular:     Rate and Rhythm: Normal rate and regular rhythm.  Pulmonary:     Effort: Pulmonary effort is normal.  Musculoskeletal:     Cervical back: Neck supple.  Skin:    General: Skin is warm and dry.  Coloration: Skin is not jaundiced or pale.     Findings: Lesion present.     Comments: Patient's right lower leg was visualized she has a noted open abscess with purulent drainage present, there is slight surrounding erythema.  Area was palpated.  Discharge was expelled, abscess is still actively draining at this time.  Neurological:     Mental Status: She is alert.  Psychiatric:        Mood and Affect: Mood normal.     ED Results / Procedures / Treatments   Labs (all labs ordered are listed, but only abnormal results are displayed) Labs Reviewed - No data to display  EKG None  Radiology No results found.  Procedures Procedures   Medications Ordered in ED Medications - No data to display  ED Course  I have reviewed the triage vital signs and the nursing notes.  Pertinent labs & imaging results that were available during my care of the patient were reviewed by me and considered in my medical decision making (see chart for details).    MDM Rules/Calculators/A&P                          Initial impression-patient presents with a wound check.  She is alert, does not appear in acute distress, vital signs  reassuring.  Work-up-due to well-appearing patient, benign physical exam, further lab or imaging not warranted at this time.  Reassessment-patient was agreeable for wound examination, using a cotton tip applicator area was probed and mechanically massaged to expel purulent discharge.  Area was slightly bleeding after procedure, patient tolerated the procedure well.  Wound was washed out and redressed.   Rule out-I have low suspicion for worsening abscess and/or cellulitis as area has markedly improved from few days ago, there is no increased surrounding erythema, area only has slight amount discharge at this time.  Will defer packing as area is still actively draining.   low suspicion for systemic infection as patient is nontoxic-appearing, vital signs reassuring.  Plan-  1.  Abscess-abscess appears to be healing appropriately and will defer change of antibiotics at this time.  will have her continue with current regiment, recommend that she continues to do warm compresses.  Follow-up with her PCP as needed.  Vital signs have remained stable, no indication for hospital admission. Patient given at home care as well strict return precautions.  Patient verbalized that they understood agreed to said plan.   Final Clinical Impression(s) / ED Diagnoses Final diagnoses:  Visit for wound check    Rx / DC Orders ED Discharge Orders    None       Carroll Sage, PA-C 06/14/20 [id number]    Maia Plan, MD 06/15/20 [phone]

## 2020-06-14 NOTE — Discharge Instructions (Addendum)
Your wound appears to be healing well, please continue with the antibiotics as prescribed.  For pain you may take over-the-counter pain medication like ibuprofen and or Tylenol every 6 or as needed.  I want you to continue to apply warm compresses to the area 3 times daily for next 7 days.  Please keep the area clean, I recommend rinsing out the area daily and change out the dressings.  Please follow-up with your PCP as needed.  Please come back if you notice you develop fevers, chills, worsening leg pain, worsening redness or swelling as these are symptoms concerning for worsening infection.

## 2020-06-19 ENCOUNTER — Encounter: Payer: Self-pay | Admitting: Internal Medicine

## 2020-06-19 ENCOUNTER — Ambulatory Visit (INDEPENDENT_AMBULATORY_CARE_PROVIDER_SITE_OTHER): Payer: Self-pay | Admitting: Internal Medicine

## 2020-06-19 ENCOUNTER — Other Ambulatory Visit: Payer: Self-pay

## 2020-06-19 VITALS — BP 88/60 | HR 69 | Ht 61.0 in | Wt 105.2 lb

## 2020-06-19 DIAGNOSIS — I071 Rheumatic tricuspid insufficiency: Secondary | ICD-10-CM

## 2020-06-19 NOTE — Patient Instructions (Signed)
Medication Instructions:  No Changes In Medications at this time.  *If you need a refill on your cardiac medications before your next appointment, please call your pharmacy*  Follow-Up: At [facility], you and your health needs are our priority.  As part of our continuing mission to provide you with exceptional heart care, we have created designated Provider Care Teams.  These Care Teams include your primary Cardiologist (physician) and Advanced Practice Providers (APPs -  Physician Assistants and Nurse Practitioners) who all work together to provide you with the care you need, when you need it.  Your next appointment:   3-4 Months month(s)  The format for your next appointment:   In Person  Provider:   Weston Brass, MD

## 2020-06-19 NOTE — Progress Notes (Signed)
Cardiology Office Note:    Date:  06/19/2020   ID:  Amber [name], DOB 05-13-1990, MRN [id number]  PCP:  [facility]  Cardiologist:  Parke Poisson, MD  Electrophysiologist:  None   Referring MD: No ref. provider found   Chief Complaint/Reason for Referral: Tricuspid valve endocarditis  History of Present Illness:    Amber [name] is a 30 y.o. female with a history of IV drug use and hepatitis C who presented to the hospital in November 2021 for worsening SOB, weakness and fatigue, found obtunded in her car in the ER parking lot. 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 She had a prolonged hospitalization and has received IV antibiotics for treatment of septic shock with MRSA bacteremia with endocarditis.   She is referred by Dr. Cliffton Asters for cardiovascular review of right heart function for timing of eventual tricuspid valve replacement. She is overall well but does experience fatigue, this is improving. Class I-II NYHA symptoms. No CP, mild SOB, no LE edema. No palpitations, no syncope.   She has been able to remain drug use free and is continued on suboxone.   Presents with her mother. We reviewed results of cardiac MRI.  Past Medical History:  Diagnosis Date   Complication of anesthesia    Hard to wake up   Endocarditis    Hepatitis    Hep C   Heroin abuse (HCC)    History of kidney stones    Hypertension    MRSA (methicillin resistant staph aureus) culture positive    Ovarian cyst    UTI (lower urinary tract infection)     Past Surgical History:  Procedure Laterality Date   APPLICATION OF ANGIOVAC N/A 12/05/2019   Procedure: APPLICATION OF ANGIOVAC;  Surgeon: Corliss Skains, MD;  Location: [facility] OR;  Service: Vascular;  Laterality: N/A;  Patient will need intraoperative TEE Can be performed in OR 14, 15, or 17 if OR 16 is not available   CARDIAC CATHETERIZATION     FINGER  SURGERY     HARDWARE REMOVAL Right 09/28/2020   Procedure: REMOVAL DEEP HARDWARE RIGHT TIBIA;  Surgeon: Nadara Mustard, MD;  Location: [facility] OR;  Service: Orthopedics;  Laterality: Right;   RIGHT HEART CATH N/A 09/25/2020   Procedure: RIGHT HEART CATH;  Surgeon: Laurey Morale, MD;  Location: [facility] INVASIVE CV LAB;  Service: Cardiovascular;  Laterality: N/A;   TEE WITHOUT CARDIOVERSION N/A 11/07/2019   Procedure: TRANSESOPHAGEAL ECHOCARDIOGRAM (TEE);  Surgeon: Wendall Stade, MD;  Location: [facility] ENDOSCOPY;  Service: Cardiovascular;  Laterality: N/A;   TEE WITHOUT CARDIOVERSION  12/05/2019   Procedure: TRANSESOPHAGEAL ECHOCARDIOGRAM (TEE);  Surgeon: Corliss Skains, MD;  Location: [facility] OR;  Service: Vascular;;   TEE WITHOUT CARDIOVERSION N/A 12/05/2019   Procedure: TRANSESOPHAGEAL ECHOCARDIOGRAM (TEE);  Surgeon: Corliss Skains, MD;  Location: [facility] OR;  Service: Thoracic;  Laterality: N/A;    Current Medications: Current Meds  Medication Sig   acetaminophen (TYLENOL) 500 MG tablet Take 1,000 mg by mouth every 6 (six) hours as needed for headache.    buprenorphine-naloxone (SUBOXONE) 8-2 mg SUBL SL tablet Place 2 tablets under the tongue daily.   escitalopram (LEXAPRO) 20 MG tablet Take 1 tablet (20 mg total) by mouth at bedtime.   Multiple Vitamin (MULTIVITAMIN WITH MINERALS) TABS tablet Take 1 tablet by mouth daily.   torsemide (DEMADEX) 10 MG tablet Take 1 tablet (10 mg total) by mouth daily  as needed (AS NEEDED FOR SHORTNESS OF BREATH OR SWELLING).   [DISCONTINUED] diclofenac Sodium (VOLTAREN) 1 % GEL Apply 4 g topically 4 (four) times daily. (Patient taking differently: Apply 4 g topically daily as needed (pain).)   [DISCONTINUED] gabapentin (NEURONTIN) 600 MG tablet Take 0.5 tablets (300 mg total) by mouth 3 (three) times daily. (Patient taking differently: Take 600 mg by mouth at bedtime.)   [DISCONTINUED] ibuprofen (ADVIL) 600 MG tablet Take 1 tablet (600 mg total) by mouth every 6 (six)  hours as needed.   [DISCONTINUED] lidocaine (LIDODERM) 5 % PLACE 2 PATCHES ONTO THE SKIN DAILY. REMOVE & DISCARD PATCH WITHIN 12 HOURS OR AS DIRECTED BY MD (Patient taking differently: Place 2 patches onto the skin daily.)   [DISCONTINUED] mirtazapine (REMERON) 15 MG tablet Take 15 mg by mouth daily as needed (bad day).     Allergies:   Bee venom and Other   Social History   Tobacco Use   Smoking status: Former    Types: Cigarettes   Smokeless tobacco: Never   Tobacco comments:    Two weeks without smoking  Vaping Use   Vaping Use: Never used  Substance Use Topics   Alcohol use: No   Drug use: Yes    Types: Marijuana, IV    Comment: opiods- pain medication/heroin     Family History: The patient's family history includes Valvular heart disease in her maternal grandmother.  ROS:   Please see the history of present illness.    All other systems reviewed and are negative.  EKGs/Labs/Other Studies Reviewed:    The following studies were reviewed today:  EKG:  06/19/20: SR, T wave abnl anteriorly 04/16/20: Sinus Bradycardia, LAE, T wave abnl anteriorly  Recent Labs: 12/15/2019: Magnesium 1.7 09/18/2020: B Natriuretic Peptide 101.9 09/25/2020: Hemoglobin 13.6; Platelets 170 10/11/2020: ALT 16; BUN 12; Creatinine, Ser 0.80; Potassium 4.4; Sodium 137  Recent Lipid Panel    Component Value Date/Time   TRIG 114 12/06/2019 1700    Physical Exam:    VS:  BP (!) 88/60 (BP Location: Right Arm, Patient Position: Sitting, Cuff Size: Normal)   Pulse 69   Ht 5\' 1"  (1.549 m)   Wt 105 lb 3.2 oz (47.7 kg)   BMI 19.88 kg/m     Wt Readings from Last 5 Encounters:  11/30/20 109 lb 12.8 oz (49.8 kg)  11/22/20 108 lb (49 kg)  10/23/20 102 lb 9.6 oz (46.5 kg)  10/11/20 102 lb (46.3 kg)  10/10/20 101 lb (45.8 kg)    Constitutional: No acute distress Eyes: sclera non-icteric, normal conjunctiva and lids ENMT: normal dentition, moist mucous membranes Cardiovascular: regular rhythm, normal  rate, 2/6 holosystolic murmur along sternal border. S1 and S2 normal. Radial pulses normal bilaterally. V wave noted with JVD to angle of jaw supine.  Respiratory: clear to auscultation bilaterally GI : normal bowel sounds, soft and nontender. No distention.   MSK: extremities warm, well perfused. No edema.  NEURO: grossly nonfocal exam, moves all extremities. PSYCH: alert and oriented x 3, normal mood and affect.   ASSESSMENT:    1. Severe tricuspid regurgitation    PLAN:    Reviewed results of cardiac MRI which show severe TR and severe RV dilation with end diastolic volume index of 173 mL/m2 with mild systolic dysfunction, which would meet surgical criteria for RV enlargement. However, she has been dealing with a right leg abscess, and prior to any consideration of tricuspid valve surgery, so as to avoid recurrent endocarditis of  prosthesis. She understands this and we will await resolution of infection prior to revisiting surgical timing. TV will need to be replaced at some point down the road, and we discussed this in detail.   I will also have her seen by HF clinic in case they can provide additional guidance on timing of intervention.   Class I-II symptoms at this time, continue torsemide as needed for SOB and LE edema. No requiring daily. Torsemide 10 mg prn.    Total time of encounter: 30 minutes total time of encounter, including 25 minutes spent in face-to-face patient care on the date of this encounter. This time includes coordination of care and counseling regarding above mentioned problem list. Remainder of non-face-to-face time involved reviewing chart documents/testing relevant to the patient encounter and documentation in the medical record. I have independently reviewed documentation from referring provider.   Weston Brass, MD, [facility] [place]  [facility]     Medication Adjustments/Labs and Tests Ordered: Current medicines are reviewed at length with the  patient today.  Concerns regarding medicines are outlined above.   Orders Placed This Encounter  Procedures   EKG 12-Lead    No orders of the defined types were placed in this encounter.   Patient Instructions  Medication Instructions:  No Changes In Medications at this time.  *If you need a refill on your cardiac medications before your next appointment, please call your pharmacy*  Follow-Up: At [facility], you and your health needs are our priority.  As part of our continuing mission to provide you with exceptional heart care, we have created designated Provider Care Teams.  These Care Teams include your primary Cardiologist (physician) and Advanced Practice Providers (APPs -  Physician Assistants and Nurse Practitioners) who all work together to provide you with the care you need, when you need it.  Your next appointment:   3-4 Months month(s)  The format for your next appointment:   In Person  Provider:   Weston Brass, MD

## 2020-07-03 ENCOUNTER — Other Ambulatory Visit: Payer: Self-pay

## 2020-07-03 DIAGNOSIS — I361 Nonrheumatic tricuspid (valve) insufficiency: Secondary | ICD-10-CM

## 2020-07-03 NOTE — Progress Notes (Signed)
Will call patient to have random urine drug screen before appointment with Dr. Cliffton Asters 07/13/20.

## 2020-07-10 ENCOUNTER — Telehealth: Payer: Self-pay | Admitting: *Deleted

## 2020-07-10 NOTE — Telephone Encounter (Signed)
Called patient's mother Altamese Cabal. Informed her that Dr. Cliffton Asters is requiring a urine drug screen for patient prior to her appt on Friday. Mother verbalizes understanding of having this done at [facility]. No questions at this time.

## 2020-07-13 ENCOUNTER — Ambulatory Visit (INDEPENDENT_AMBULATORY_CARE_PROVIDER_SITE_OTHER): Payer: Medicaid Other | Admitting: Thoracic Surgery (Cardiothoracic Vascular Surgery)

## 2020-07-13 ENCOUNTER — Encounter: Payer: Self-pay | Admitting: Thoracic Surgery (Cardiothoracic Vascular Surgery)

## 2020-07-13 ENCOUNTER — Other Ambulatory Visit: Payer: Self-pay | Admitting: *Deleted

## 2020-07-13 ENCOUNTER — Other Ambulatory Visit: Payer: Self-pay

## 2020-07-13 VITALS — BP 108/72 | HR 82 | Resp 20 | Ht 61.0 in | Wt 102.0 lb

## 2020-07-13 DIAGNOSIS — R7401 Elevation of levels of liver transaminase levels: Secondary | ICD-10-CM

## 2020-07-13 DIAGNOSIS — I079 Rheumatic tricuspid valve disease, unspecified: Secondary | ICD-10-CM

## 2020-07-13 DIAGNOSIS — I361 Nonrheumatic tricuspid (valve) insufficiency: Secondary | ICD-10-CM

## 2020-07-13 NOTE — Progress Notes (Signed)
      [address]       [place] [address]             [phone]        CYRSTAL LEITZ [facility] Medical Record #[id number] Date of Birth: 01-Oct-1990  Referring: Parke Poisson, MD Primary Care: [facility] Primary Cardiologist:Gayatri Wynell Balloon, MD  Reason for visit:   follow-up  History of Present Illness:     Mrs. Harvie presents for follow-up appointment.  She recently underwent an MRI which showed a dilated RV and ongoing severe tricuspid valve regurgitation.  Symptomatically she is doing quite well.  She remains drug-free.  She does have early satiety and usually only eats about 4-5 bites of her meals.  She was recently seen in the emergency department for an abscess drainage in her right leg.  Physical Exam: BP 108/72   Pulse 82   Resp 20   Ht 5\' 1"  (1.549 m)   Wt 102 lb (46.3 kg)   SpO2 97% Comment: RA  BMI 19.27 kg/m   Alert NAD Regular rate. Easy work of breathing. She does have some fullness over her right anterior tibia which is in the same line is a scar from her knee surgery.   Diagnostic Studies & Laboratory data: Cardiac MRI:  IMPRESSION: 1. Severe RV dilatation (RVEDVI 173 mL/m^2) with mild systolic dysfunction (EF 45%)   2.  Severe tricuspid regurgitation (regurgitant fraction 62%)   3. Normal LV size and systolic function (EF 57%). Septal flattening during diastole consistent with RV volume overload   4. RV insertion site late gadolinium enhancement, which is a nonspecific finding often seen in setting of elevated pulmonary pressures    Assessment / Plan:   30 year old female with a history of IV drug abuse and tricuspid valve endocarditis who has been off IV drugs for over 140 days.  She comes in today to discuss potential surgical planning for her tricuspid valve repair/replacement.  On review of her cardiac MRI she does have significant dilation in her right ventricle, as well as severe tricuspid  valve regurgitation.  1 thing that concerns me is the RV dilation which may be related to elevated pulmonary pressures in the setting of multiple septic emboli when she originally presented with her endocarditis.  I think that the tricuspid valve regurgitation is currently acting as a pop-off valve and this should be evaluated before proceeding with surgery.    The other area is concern is a recurrent right knee abscesses that required drainage.  She had knee surgery in the past and currently has a titanium rod in place and I question whether or not this titanium rod is seated.  If that is the case then this will need to be addressed prior to undergoing any surgical repair.  In the meantime I will order right upper quadrant ultrasound along with LFTs to ensure that she is not developing an transaminitis from her tricuspid valve regurgitation.  I will speak with Dr. 26 to coordinate further care.     Jacques Navy 07/13/2020 11:32 AM  09/13/2020 Shane Crutch

## 2020-07-14 LAB — COMPREHENSIVE METABOLIC PANEL
AG Ratio: 1.1 (calc) (ref 1.0–2.5)
ALT: 10 U/L (ref 6–29)
AST: 19 U/L (ref 10–30)
Albumin: 3.9 g/dL (ref 3.6–5.1)
Alkaline phosphatase (APISO): 289 U/L — ABNORMAL HIGH (ref 31–125)
BUN: 15 mg/dL (ref 7–25)
CO2: 25 mmol/L (ref 20–32)
Calcium: 9.4 mg/dL (ref 8.6–10.2)
Chloride: 103 mmol/L (ref 98–110)
Creat: 0.71 mg/dL (ref 0.50–1.10)
Globulin: 3.7 g/dL (calc) (ref 1.9–3.7)
Glucose, Bld: 98 mg/dL (ref 65–99)
Potassium: 4.1 mmol/L (ref 3.5–5.3)
Sodium: 138 mmol/L (ref 135–146)
Total Bilirubin: 0.8 mg/dL (ref 0.2–1.2)
Total Protein: 7.6 g/dL (ref 6.1–8.1)

## 2020-07-17 ENCOUNTER — Ambulatory Visit
Admission: RE | Admit: 2020-07-17 | Discharge: 2020-07-17 | Disposition: A | Discharge status: Home / Self Care | Payer: Medicaid Other | Source: Ambulatory Visit | Attending: Thoracic Surgery (Cardiothoracic Vascular Surgery) | Admitting: Thoracic Surgery (Cardiothoracic Vascular Surgery)

## 2020-07-17 DIAGNOSIS — R7401 Elevation of levels of liver transaminase levels: Secondary | ICD-10-CM

## 2020-07-17 IMAGING — US US ABDOMEN LIMITED
1 series · 14 of 25 positions shown · non-contrast
Comparison: [DATE]

CLINICAL DATA: Transaminitis

Hepatitis C
EXAM:
ULTRASOUND ABDOMEN LIMITED RIGHT UPPER QUADRANT

[Series 1: us abdomen limited · 0.15mm/px · 14 of 44 slices shown]
[im 1/44]
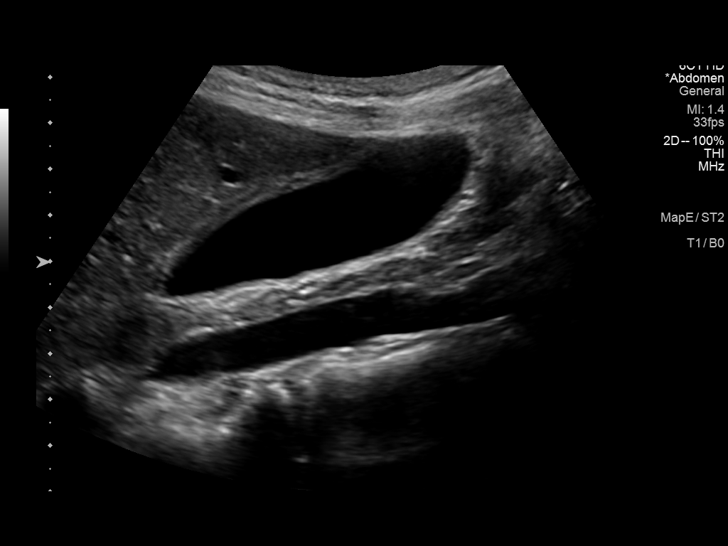
[im 4/44]
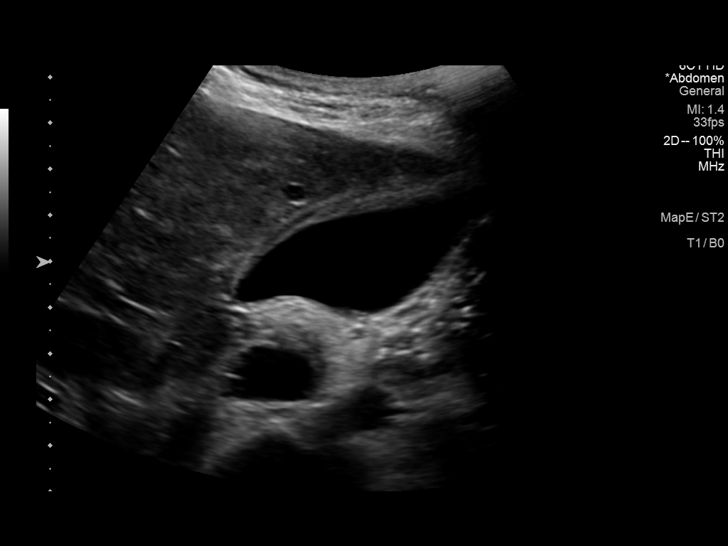
[im 8/44]
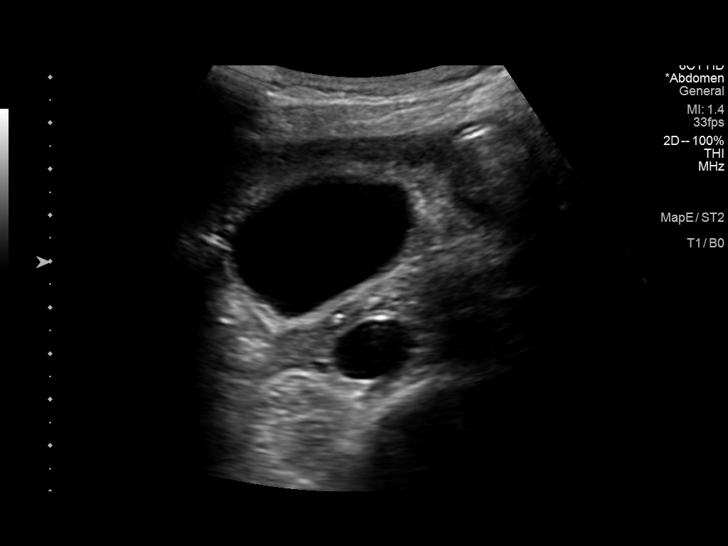
[im 11/44]
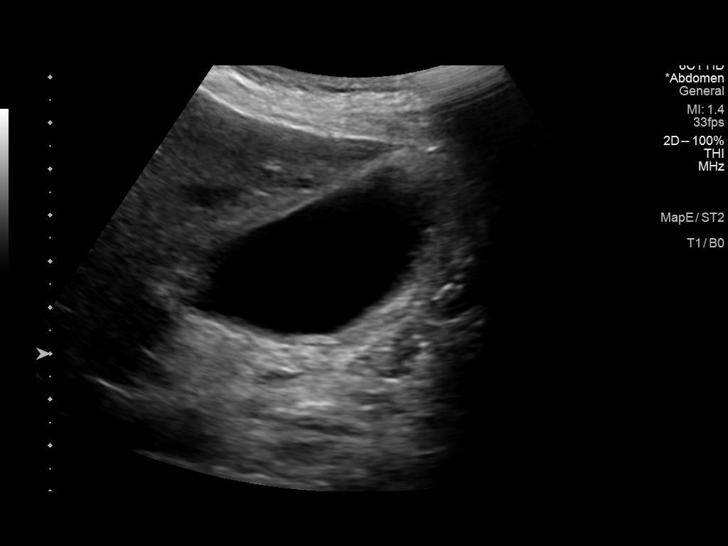
[im 15/44]
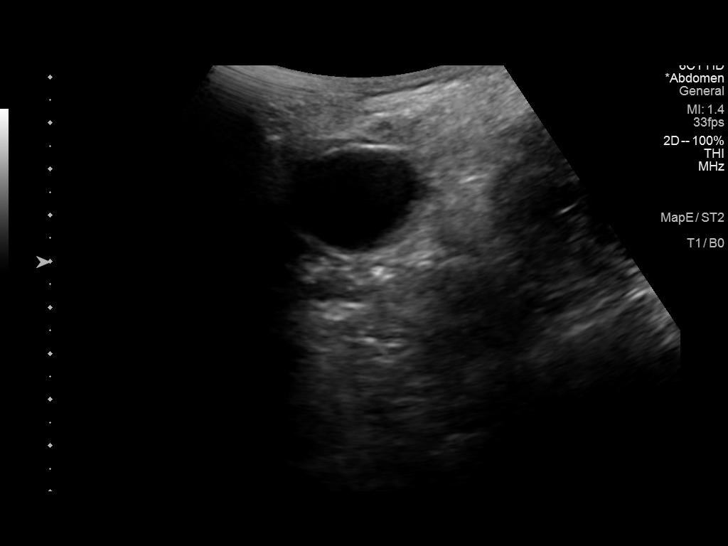
[im 17/44]
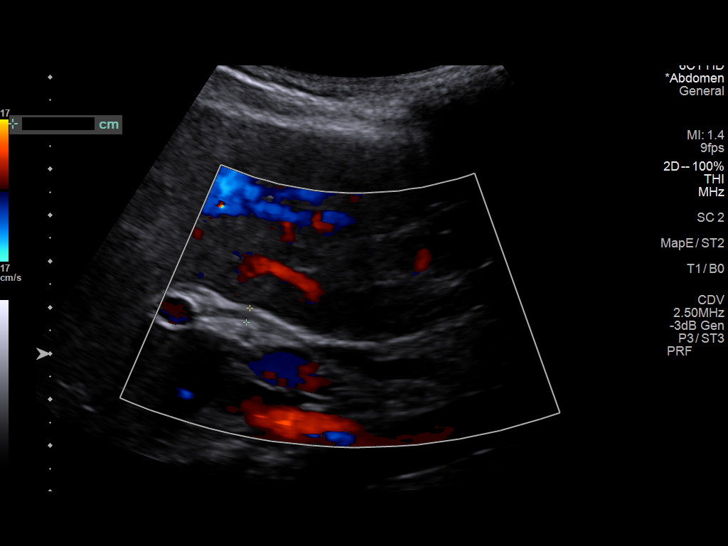
[im 20/44]
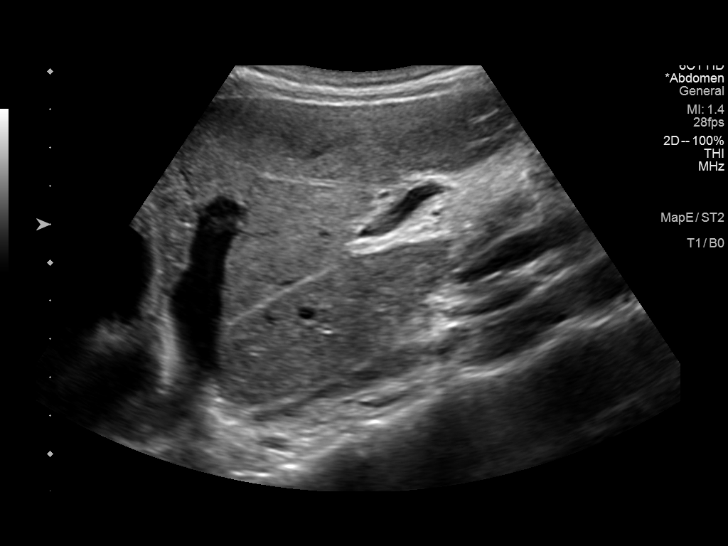
[im 24/44]
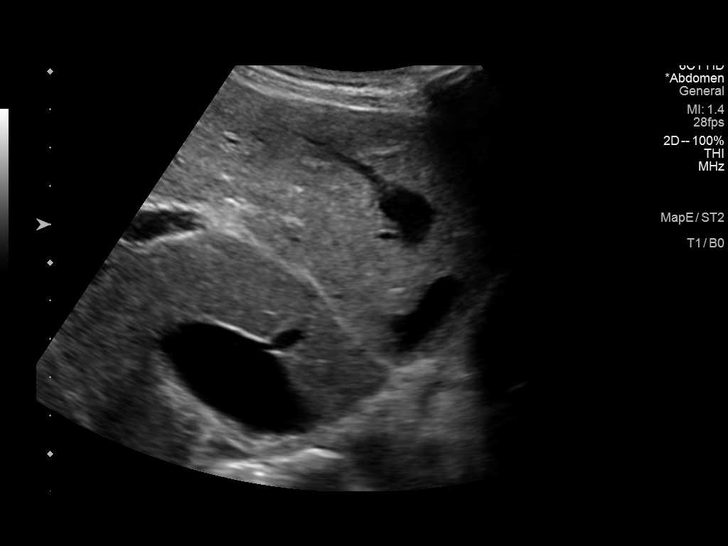
[im 27/44]
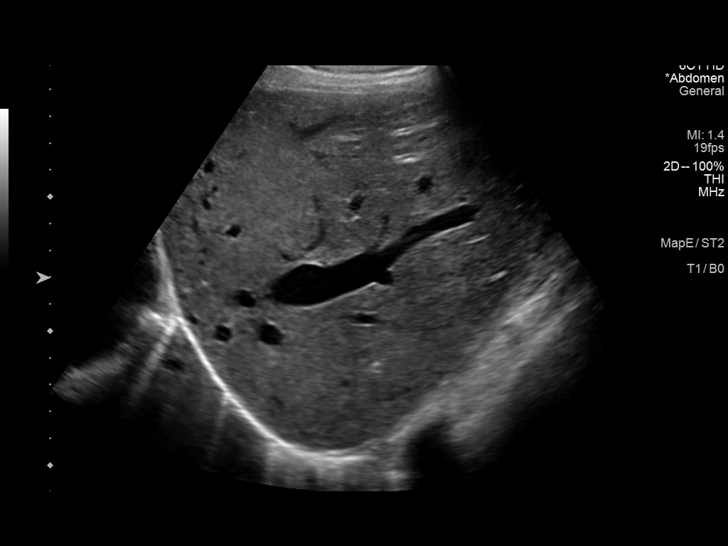
[im 29/44]
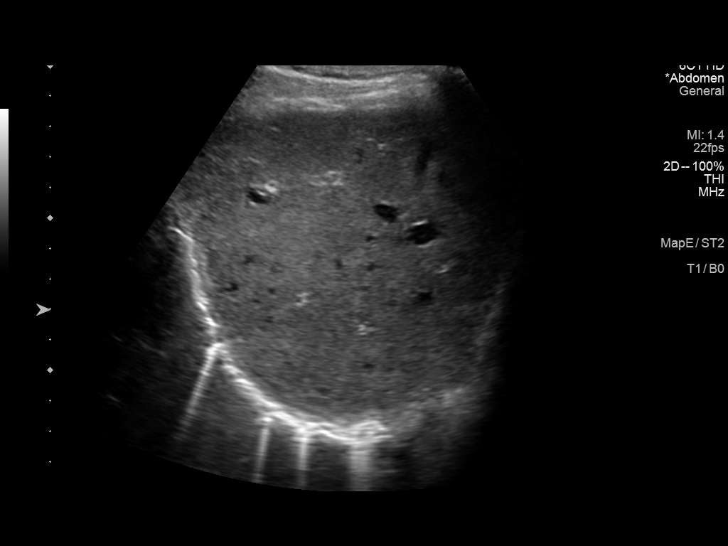
[im 33/44]
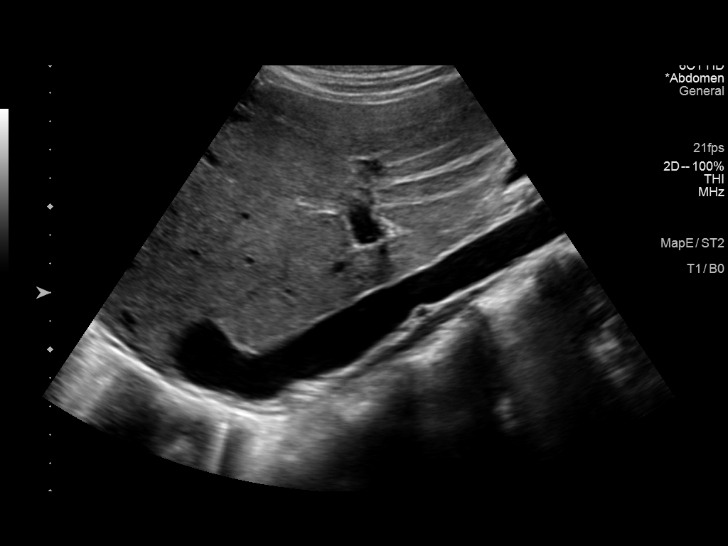
[im 36/44]
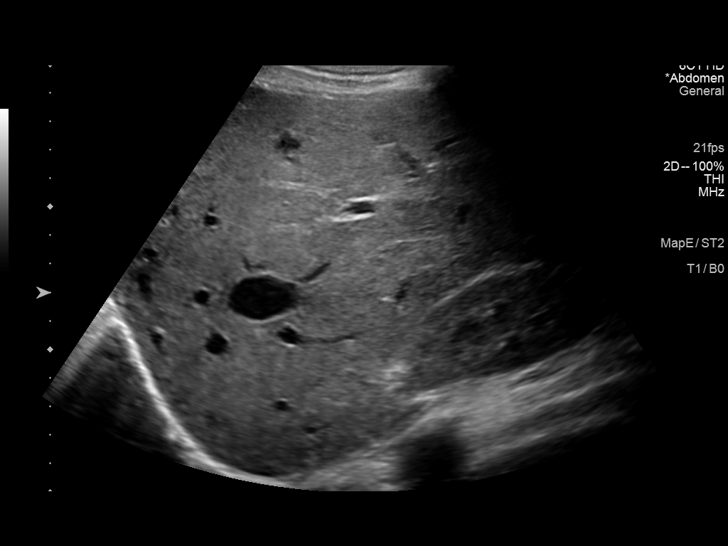
[im 40/44]
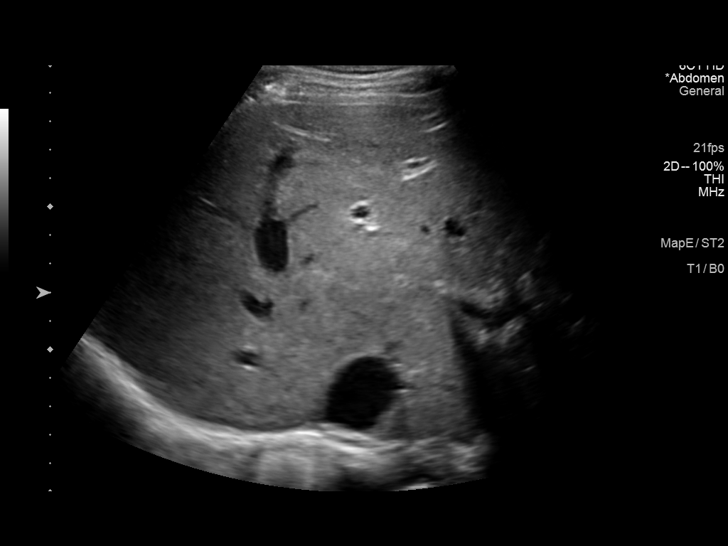
[im 44/44]
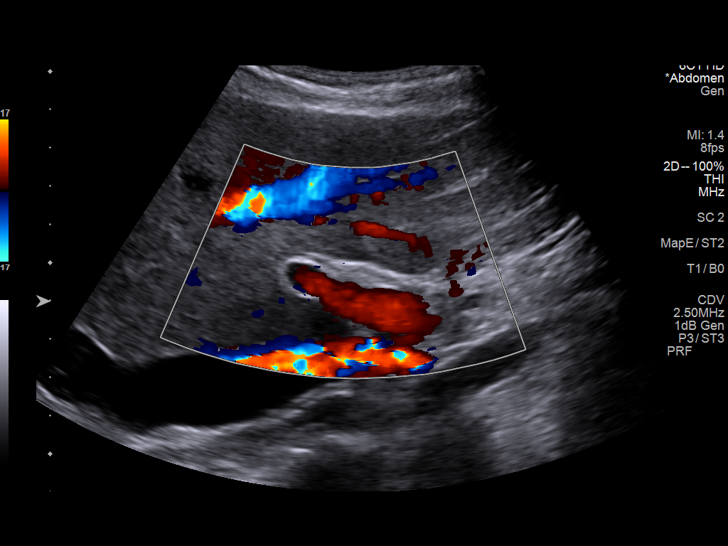

[14 of 25 positions shown; findings below may reference images not displayed]

FINDINGS: Gallbladder:

No gallstones or wall thickening visualized. No sonographic Murphy
sign noted by sonographer.

Common bile duct:

Diameter: 3 mm

Liver:

No focal lesion identified. Within normal limits in parenchymal
echogenicity. Portal vein is patent on color Doppler imaging with
normal direction of blood flow towards the liver.

Other: None.
IMPRESSION: No significant sonographic abnormality of the liver or gallbladder

## 2020-07-18 ENCOUNTER — Ambulatory Visit
Admission: RE | Admit: 2020-07-18 | Discharge: 2020-07-18 | Disposition: A | Discharge status: Home / Self Care | Payer: Medicaid Other | Source: Ambulatory Visit | Attending: Internal Medicine | Admitting: Internal Medicine

## 2020-07-18 ENCOUNTER — Other Ambulatory Visit: Payer: Self-pay

## 2020-07-18 ENCOUNTER — Encounter (HOSPITAL_BASED_OUTPATIENT_CLINIC_OR_DEPARTMENT_OTHER): Payer: Medicaid Other | Attending: Internal Medicine | Admitting: Internal Medicine

## 2020-07-18 ENCOUNTER — Other Ambulatory Visit (HOSPITAL_COMMUNITY)
Admission: RE | Admit: 2020-07-18 | Discharge: 2020-07-18 | Disposition: A | Discharge status: Home / Self Care | Payer: Medicaid Other | Source: Other Acute Inpatient Hospital | Attending: Internal Medicine | Admitting: Internal Medicine

## 2020-07-18 DIAGNOSIS — S81801A Unspecified open wound, right lower leg, initial encounter: Secondary | ICD-10-CM

## 2020-07-18 DIAGNOSIS — Z86711 Personal history of pulmonary embolism: Secondary | ICD-10-CM | POA: Insufficient documentation

## 2020-07-18 DIAGNOSIS — B182 Chronic viral hepatitis C: Secondary | ICD-10-CM | POA: Insufficient documentation

## 2020-07-18 DIAGNOSIS — L97818 Non-pressure chronic ulcer of other part of right lower leg with other specified severity: Secondary | ICD-10-CM | POA: Insufficient documentation

## 2020-07-18 DIAGNOSIS — I361 Nonrheumatic tricuspid (valve) insufficiency: Secondary | ICD-10-CM | POA: Insufficient documentation

## 2020-07-18 IMAGING — DX DG TIBIA/FIBULA 2V*R*
2 series · 2 of 2 positions shown · non-contrast
Comparison: None.

CLINICAL DATA: History of prior lateral tibial plateau fracture
with hardware in place. There is a wound involving the tibia.

EXAM:
RIGHT TIBIA AND FIBULA - 2 VIEW

[dg tibia/fibula right (1 of 2)]
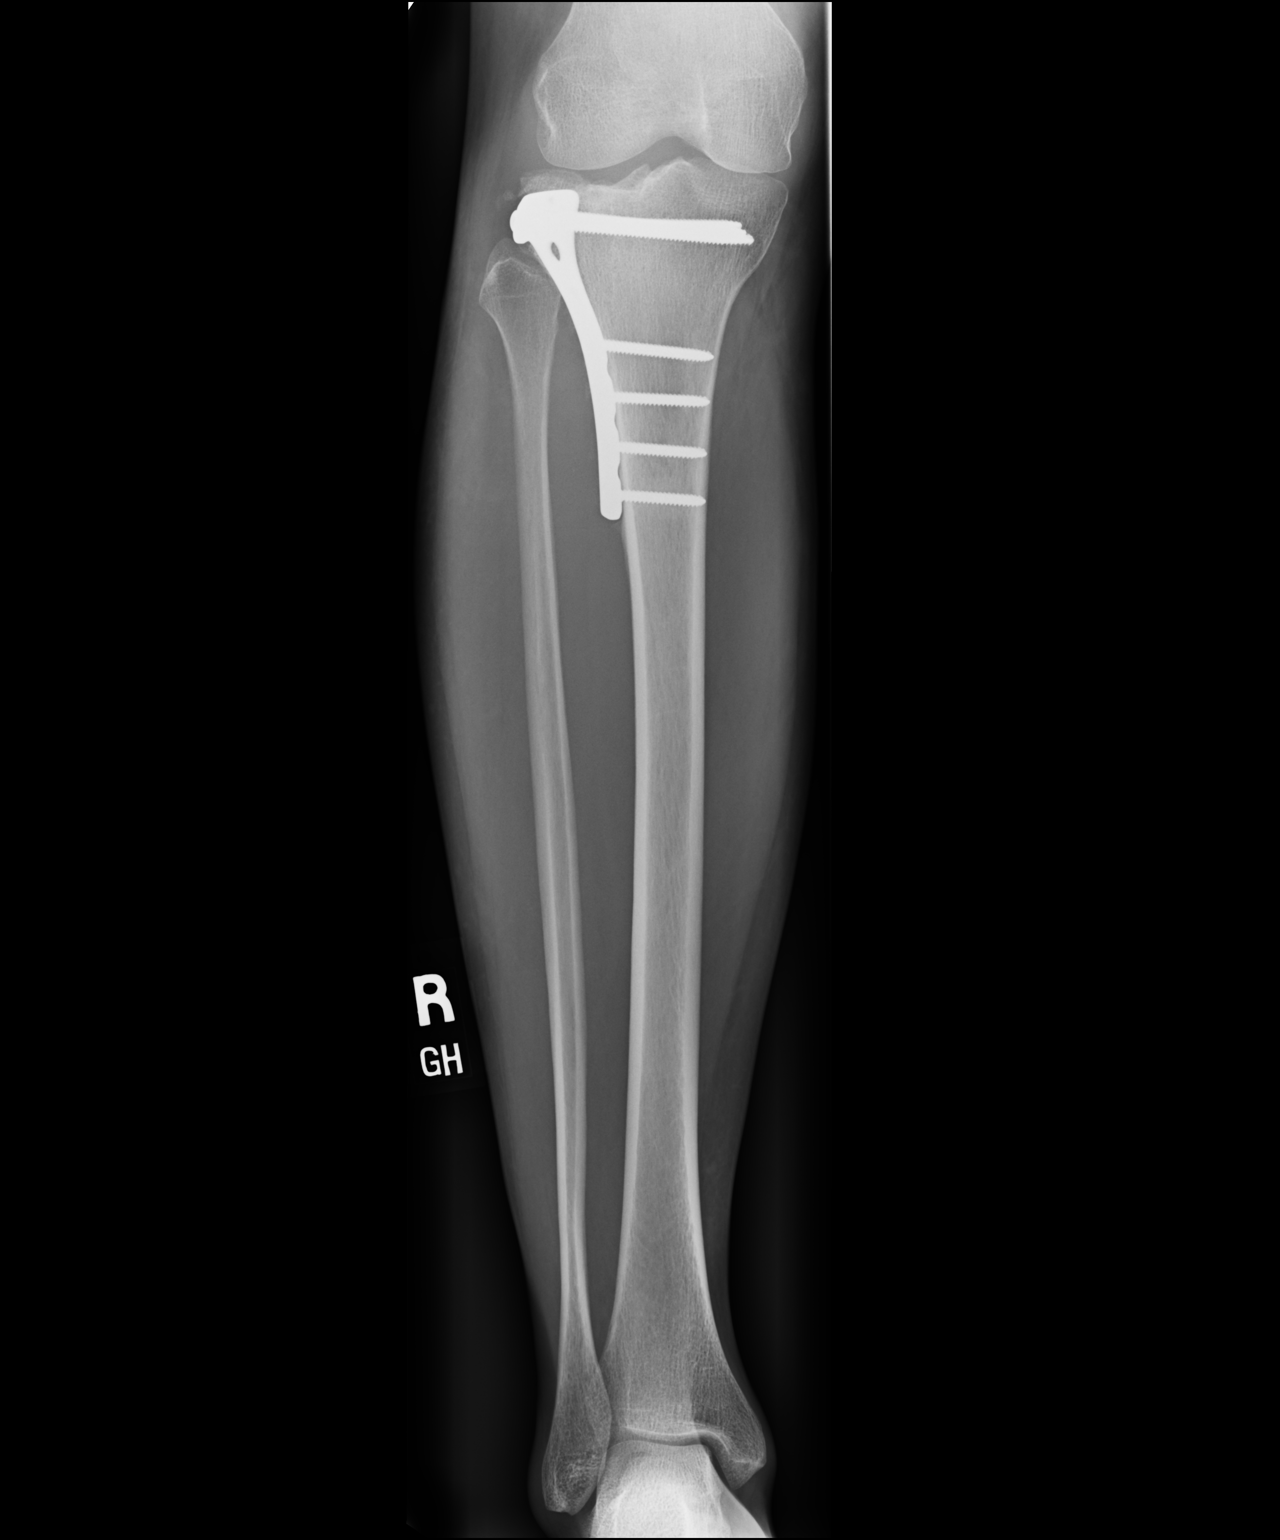

[dg tibia/fibula right (2 of 2)]
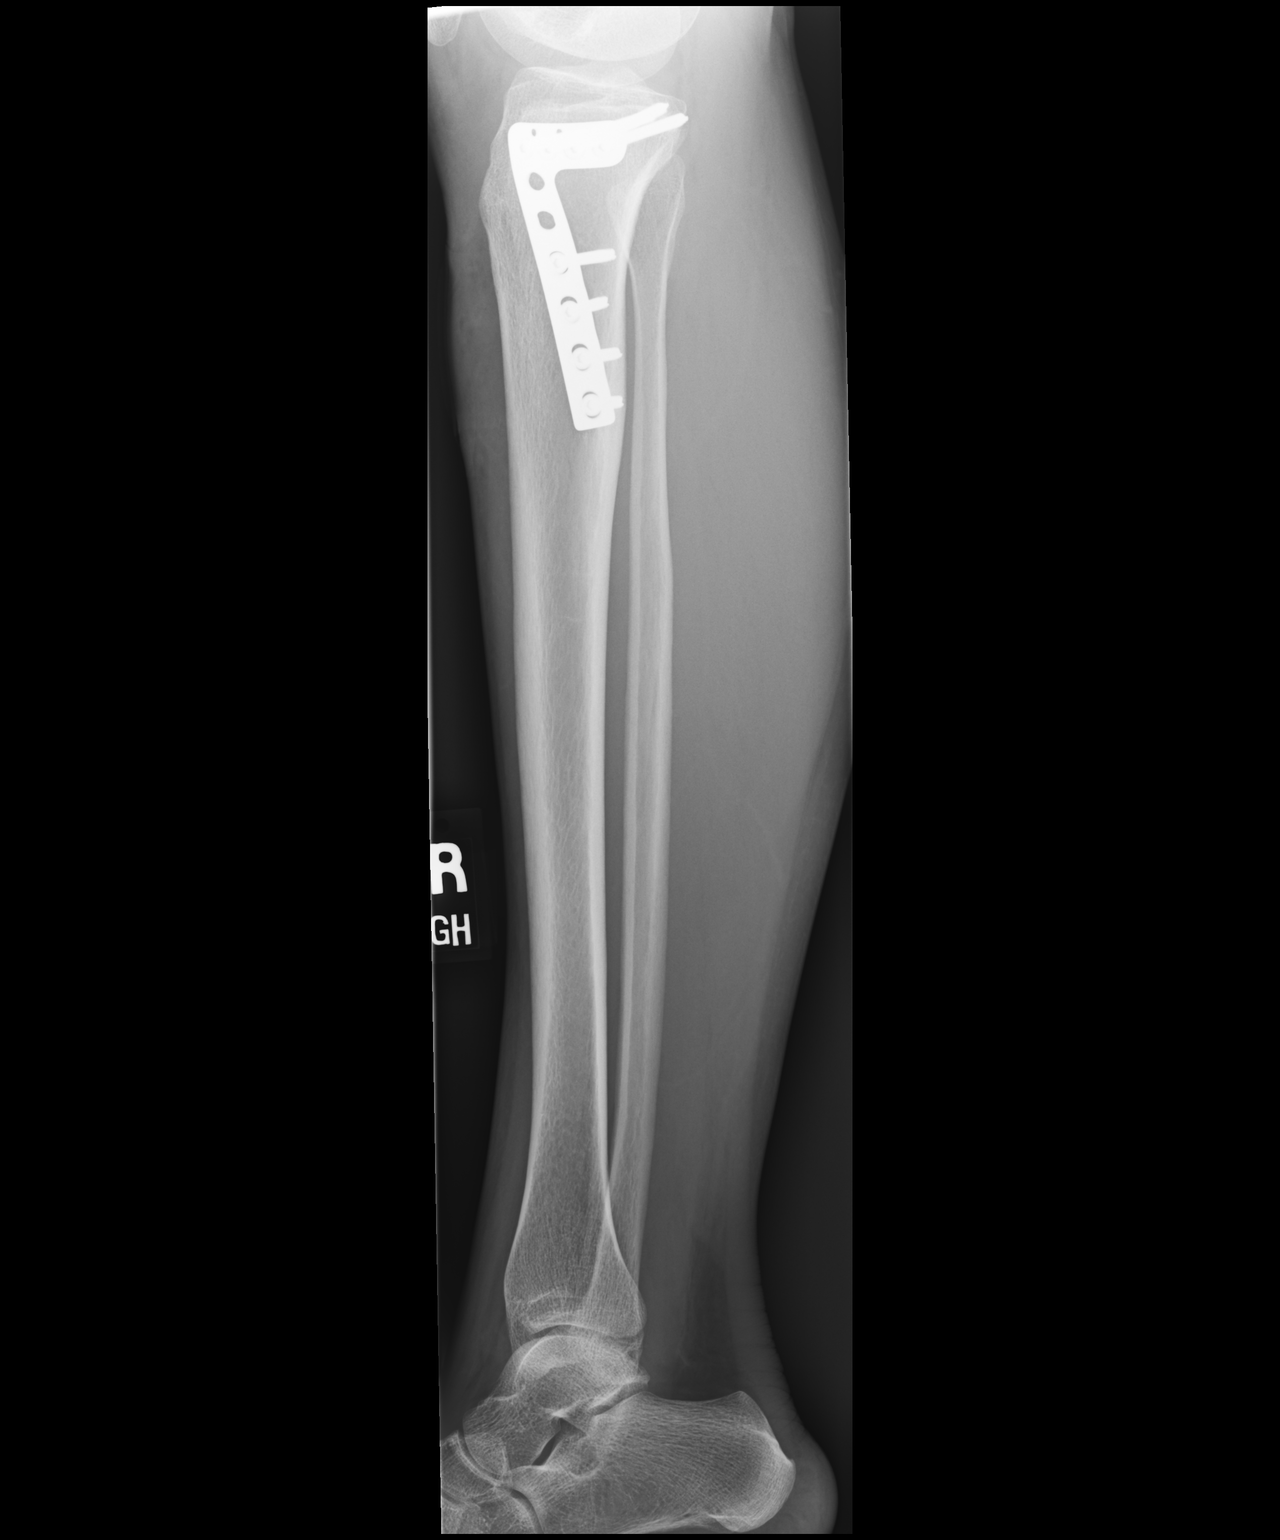

[2 of 2 positions shown; findings below may reference images not displayed]

FINDINGS: The fixation hardware is in place. No complicating features are
identified. The tibia and fibula are intact. The ankle joint is
maintained.

No destructive bony changes to suggest osteomyelitis. No abnormal
soft tissue calcifications.
IMPRESSION: 1. Intact tibial hardware without complicating features.
2. No acute bony findings.

## 2020-07-18 NOTE — Progress Notes (Signed)
[name], Amber L. ([id number]) Visit Report for 07/18/2020 Allergy List Details Patient Name: Date of Service: Amber [name], Amber [name] 07/18/2020 1:15 PM Medical Record Number: [id number] Patient Account Number: [id number] Date of Birth/Sex: Treating RN: 02/22/1990 (30 y.o. Female) Amber [name] Primary Care Amber [name]: Amber [name] Other Clinician: Referring Amber [name]: Treating Amber [name]/Extender: Amber [name] in Treatment: 0 Allergies Active Allergies bee venom protein (honey bee) Reaction: anaphylaxis Allergy Notes Electronic Signature(s) Signed: 07/18/2020 6:50:03 PM By: Amber [name] Entered By: Amber [name] on 07/18/2020 13:44:13 -------------------------------------------------------------------------------- Arrival Information Details Patient Name: Date of Service: Amber [name]. 07/18/2020 1:15 PM Medical Record Number: [id number] Patient Account Number: [id number] Date of Birth/Sex: Treating RN: 07-Jan-1991 (30 y.o. Female) Amber [name] Primary Care Amber [name]: Amber [name] Other Clinician: Referring Amber [name]: Treating Amber [name]/Extender: Amber [name] in Treatment: 0 Visit Information Patient Arrived: Ambulatory Arrival Time: 13:13 Accompanied By: mother Transfer Assistance: None Patient Identification Verified: Yes Secondary Verification Process Completed: Yes Patient Requires Transmission-Based Precautions: No Patient Has Alerts: No Electronic Signature(s) Signed: 07/18/2020 6:50:03 PM By: Amber [name] Entered By: Amber [name] on 07/18/2020 13:41:55 -------------------------------------------------------------------------------- Clinic Level of Care Assessment Details Patient Name: Date of Service: Amber [name] 07/18/2020 1:15 PM Medical Record Number: [id number] Patient Account Number: [id number] Date of Birth/Sex: Treating RN: 1990/05/21 (30 y.o. Female) Amber [name] Primary Care Kalecia Hartney: Amber [name] Other  Clinician: Referring Amber [name]: Treating Amber [name]/Extender: Amber [name] in Treatment: 0 Clinic Level of Care Assessment Items TOOL 1 Quantity Score X- 1 0 Use when EandM and Procedure is performed on INITIAL visit ASSESSMENTS - Nursing Assessment / Reassessment X- 1 20 General Physical Exam (combine w/ comprehensive assessment (listed just below) when performed on new pt. evals) X- 1 25 Comprehensive Assessment (HX, ROS, Risk Assessments, Wounds Hx, etc.) ASSESSMENTS - Wound and Skin Assessment / Reassessment []  - 0 Dermatologic / Skin Assessment (not related to wound area) ASSESSMENTS - Ostomy and/or Continence Assessment and Care []  - 0 Incontinence Assessment and Management []  - 0 Ostomy Care Assessment and Management (repouching, etc.) PROCESS - Coordination of Care X - Simple Patient / Family Education for ongoing care 1 15 []  - 0 Complex (extensive) Patient / Family Education for ongoing care X- 1 10 Staff obtains , Records, T Results / Process Orders est []  - 0 Staff telephones HHA, Nursing Homes / Clarify orders / etc []  - 0 Routine Transfer to another Facility (non-emergent condition) []  - 0 Routine Hospital Admission (non-emergent condition) X- 1 15 New Admissions / / Ordering NPWT Apligraf, etc. , []  - 0 Emergency Hospital Admission (emergent condition) PROCESS - Special Needs []  - 0 Pediatric / Minor Patient Management []  - 0 Isolation Patient Management []  - 0 Hearing / Language / Visual special needs []  - 0 Assessment of Community assistance (transportation, D/C planning, etc.) []  - 0 Additional assistance / Altered mentation []  - 0 Support Surface(s) Assessment (bed, cushion, seat, etc.) INTERVENTIONS - Miscellaneous []  - 0 External ear exam []  - 0 Patient Transfer (multiple staff / / Similar devices) []  - 0 Simple Staple / Suture removal (25 or less) []  - 0 Complex Staple /  Suture removal (26 or more) []  - 0 Hypo/Hyperglycemic Management (do not check if billed separately) X- 1 15 Ankle / Brachial Index (ABI) - do not check if billed separately Has the patient been seen at the hospital within the last three years: Yes Total Score: 100 Level Of Care:  New/Established - Level 3 Electronic Signature(s) Signed: 07/18/2020 7:11:13 PM By: Amber Abts RN, BSN Entered By: Amber [name] on 07/18/2020 18:27:09 -------------------------------------------------------------------------------- Lower Extremity Assessment Details Patient Name: Date of Service: Amber LATREECE, MOCHIZUKI 07/18/2020 1:15 PM Medical Record Number: [id number] Patient Account Number: [id number] Date of Birth/Sex: Treating RN: [birth year] (30 y.o. Female) Amber [name] Primary Care Amber [name]: Amber [name] Other Clinician: Referring Amber [name]: Treating Amber [name]/Extender: Amber [name] in Treatment: 0 Amber [name]: No] Amber [name]: Yes] Amber: [Left: [name]] [Right: o] Calf Left: Right: Point of Measurement: 26 cm From Medial Instep 30 cm Ankle Left: Right: Point of Measurement: 7 cm From Medial Instep 19 cm Knee To Floor Left: Right: From Medial Instep 36 cm Vascular Assessment Pulses: Dorsalis Pedis Palpable: [Right:Yes] Doppler Audible: [Right:Yes] Posterior Tibial Palpable: [Right:Yes] Doppler Audible: [Right:Yes] Blood Pressure: Brachial: [Right:104] Ankle: [Right:Dorsalis Pedis: 120 1.15] Electronic Signature(s) Signed: 07/18/2020 6:50:03 PM By: Amber [name] Entered By: Amber [name] on 07/18/2020 14:04:09 -------------------------------------------------------------------------------- Multi Wound Chart Details Patient Name: Date of Service: Amber [name], Amber L. 07/18/2020 1:15 PM Medical Record Number: [id number] Patient Account Number: [id number] Date of Birth/Sex: Treating RN: [birth year] (30 y.o. Female) Amber [name] Primary Care  Amber [name]: Amber [name] Other Clinician: Referring Amber [name]: Treating Amber [name]/Extender: Amber [name] in Treatment: 0 Vital Signs Height(in): 62 Pulse(bpm): 56 Weight(lbs): 102 Blood Pressure(mmHg): 104/56 Body Mass Index(BMI): 19 Temperature(F): 98.0 Respiratory Rate(breaths/min): 18 Photos: [1:No Photos Right, Anterior Lower Leg] [[name]/A:[name]/A [name]/A] Wound Location: [1:Gradually Appeared] [[name]/A:[name]/A] Wounding Event: [1:Abscess] [[name]/A:[name]/A] Primary Etiology: [1:Hepatitis C, Neuropathy] [[name]/A:[name]/A] Comorbid History: [1:06/21/2020] [[name]/A:[name]/A] Date Acquired: [1:0] [[name]/A:[name]/A] Weeks of Treatment: [1:Open] [[name]/A:[name]/A] Wound Status: [1:0.5x0.3x0.2] [[name]/A:[name]/A] Measurements L x W x D (cm) [1:0.118] [[name]/A:[name]/A] A (cm) : rea [1:0.024] [[name]/A:[name]/A] Volume (cm) : [1:Full Thickness Without Exposed] [[name]/A:[name]/A] Classification: [1:Support Structures Medium] [[name]/A:[name]/A] Exudate A mount: [1:Purulent] [[name]/A:[name]/A] Exudate Type: [1:yellow, brown, green] [[name]/A:[name]/A] Exudate Color: [1:Distinct, outline attached] [[name]/A:[name]/A] Wound Margin: [1:Small (1-33%)] [[name]/A:[name]/A] Granulation A mount: [1:Red, Pink] [[name]/A:[name]/A] Granulation Quality: [1:Large (67-100%)] [[name]/A:[name]/A] Necrotic A mount: [1:Fat Layer (Subcutaneous Tissue): Yes [name]/A] Exposed Structures: [1:Fascia: No Tendon: No Muscle: No Joint: No Bone: No None] [[name]/A:[name]/A] Epithelialization: [1:Debridement - Excisional] [[name]/A:[name]/A] Debridement: Pre-procedure Verification/Time Out 14:18 [[name]/A:[name]/A] Taken: [1:Subcutaneous, Slough] [[name]/A:[name]/A] Tissue Debrided: [1:Skin/Subcutaneous Tissue] [[name]/A:[name]/A] Level: [1:0.15] [[name]/A:[name]/A] Debridement A (sq cm): [1:rea Curette] [[name]/A:[name]/A] Instrument: [1:Minimum] [[name]/A:[name]/A] Bleeding: [1:Pressure] [[name]/A:[name]/A] Hemostasis A chieved: [1:4] [[name]/A:[name]/A] Procedural Pain: [1:2] [[name]/A:[name]/A] Post Procedural Pain: [1:Procedure was tolerated well] [[name]/A:[name]/A] Debridement Treatment Response: [1:0.5x0.3x0.2] [[name]/A:[name]/A] Post Debridement Measurements L x W x  D (cm) [1:0.024] [[name]/A:[name]/A] Post Debridement Volume: (cm) [1:Debridement] [[name]/A:[name]/A] Treatment Notes Electronic Signature(s) Signed: 07/18/2020 5:13:41 PM By: Baltazar Najjar MD Signed: 07/18/2020 7:11:13 PM By: Amber Abts RN, BSN Entered By: Baltazar Najjar on 07/18/2020 15:22:51 -------------------------------------------------------------------------------- Multi-Disciplinary Care Plan Details Patient Name: Date of Service: Amber Divers L. 07/18/2020 1:15 PM Medical Record Number: [id number] Patient Account Number: [id number] Date of Birth/Sex: Treating RN: 04-Feb-1990 (30 y.o. Female) Amber [name] Primary Care Bradin Mcadory: Amber [name] Other Clinician: Referring Orilla Templeman: Treating Adrienna Karis/Extender: Amber [name] in Treatment: 0 Multidisciplinary Care Plan reviewed with physician Active Inactive Wound/Skin Impairment Nursing Diagnoses: Impaired tissue integrity Knowledge deficit related to ulceration/compromised skin integrity Goals: Patient/caregiver will verbalize understanding of skin care regimen Date Initiated: 07/18/2020 Target Resolution Date: 08/17/2020 Goal Status: Active Ulcer/skin breakdown will have a volume reduction of 30% by week 4 Date Initiated: 07/18/2020 Target Resolution Date: 08/17/2020 Goal Status: Active Interventions: Assess patient/caregiver ability to obtain necessary  supplies Assess patient/caregiver ability to perform ulcer/skin care regimen upon admission and as needed Assess ulceration(s) every visit Provide education on ulcer and skin care Notes: Electronic Signature(s) Signed: 07/18/2020 7:11:13 PM By: Amber Abts RN, BSN Entered By: Amber [name] on 07/18/2020 18:26:40 -------------------------------------------------------------------------------- Pain Assessment Details Patient Name: Date of Service: Amber [name]. 07/18/2020 1:15 PM Medical Record Number: [id number] Patient Account Number: [id number] Date  of Birth/Sex: Treating RN: [birth year] (30 y.o. Female) Amber [name] Primary Care Omeka Holben: Amber [name] Other Clinician: Referring Eben Choinski: Treating Layson Bertsch/Extender: Amber [name] in Treatment: 0 Active Problems Location of Pain Severity and Description of Pain Patient Has Paino Yes Site Locations Pain Location: Pain in Ulcers Rate the pain. Current Pain Level: 8 Worst Pain Level: 10 Least Pain Level: 9 Tolerable Pain Level: 9 Pain Management and Medication Current Pain Management: Medication: No Cold Application: No Rest: No Massage: No Activity: No T.E.[name].S.: No Heat Application: No Leg drop or elevation: No Is the Current Pain Management Adequate: Adequate How does your wound impact your activities of daily livingo Sleep: No Bathing: No Appetite: No Relationship With Others: No Bladder Continence: No Emotions: No Bowel Continence: No Work: No Toileting: No Drive: No Dressing: No Hobbies: No Electronic Signature(s) Signed: 07/18/2020 6:50:03 PM By: Amber [name] Entered By: Amber [name] on 07/18/2020 13:49:26 -------------------------------------------------------------------------------- Patient/Caregiver Education Details Patient Name: Date of Service: Amber [name] 7/6/2022andnbsp1:15 PM Medical Record Number: [id number] Patient Account Number: [id number] Date of Birth/Gender: Treating RN: April 11, 1990 (30 y.o. Female) Amber [name] Primary Care Physician: Amber [name] Other Clinician: Referring Physician: Treating Physician/Extender: Amber [name] in Treatment: 0 Education Assessment Education Provided To: Patient Education Topics Provided Wound/Skin Impairment: Methods: Explain/Verbal Responses: State content correctly Electronic Signature(s) Signed: 07/18/2020 7:11:13 PM By: Amber Abts RN, BSN Entered By: Amber [name] on 07/18/2020  18:26:47 -------------------------------------------------------------------------------- Wound Assessment Details Patient Name: Date of Service: Amber [name] 07/18/2020 1:15 PM Medical Record Number: [id number] Patient Account Number: [id number] Date of Birth/Sex: Treating RN: [birth year] (30 y.o. Female) Amber [name] Primary Care Trenesha Alcaide: Amber [name] Other Clinician: Referring Betsy Rosello: Treating Ryett Hamman/Extender: Amber [name] in Treatment: 0 Wound Status Wound Number: 1 Primary Etiology: Abscess Wound Location: Right, Anterior Lower Leg Wound Status: Open Wounding Event: Gradually Appeared Comorbid History: Hepatitis C, Neuropathy Date Acquired: 06/21/2020 Weeks Of Treatment: 0 Clustered Wound: No Photos Wound Measurements Length: (cm) 0.5 Width: (cm) 0.3 Depth: (cm) 0.2 Area: (cm) 0.118 Volume: (cm) 0.024 % Reduction in Area: 0% % Reduction in Volume: 0% Epithelialization: None Tunneling: No Undermining: No Wound Description Classification: Full Thickness Without Exposed Support Structu Wound Margin: Distinct, outline attached Exudate Amount: Medium Exudate Type: Purulent Exudate Color: yellow, brown, green res Foul Odor After Cleansing: No Slough/Fibrino Yes Wound Bed Granulation Amount: Small (1-33%) Exposed Structure Granulation Quality: Red, Pink Fascia Exposed: No Necrotic Amount: Large (67-100%) Fat Layer (Subcutaneous Tissue) Exposed: Yes Necrotic Quality: Adherent Slough Tendon Exposed: No Muscle Exposed: No Joint Exposed: No Bone Exposed: No Treatment Notes Wound #1 (Lower Leg) Wound Laterality: Right, Anterior Cleanser Soap and Water Discharge Instruction: May shower and wash wound with dial antibacterial soap and water prior to dressing change. Peri-Wound Care Skin Prep Discharge Instruction: Use skin prep as directed Topical Primary Dressing KerraCel Ag Gelling Fiber Dressing, 2x2 in (silver  alginate) Discharge Instruction: Apply silver alginate to wound bed as instructed Secondary Dressing Bordered Gauze, 2x2 in Discharge Instruction: Apply over primary dressing as directed. Secured With Compression  Wrap Compression Stockings Add-Ons Electronic Signature(s) Signed: 07/18/2020 5:22:20 PM By: Karl Itoawkins, Destiny Signed: 07/18/2020 6:50:03 PM By: Amber Stalleaton, Bobbi Entered By: Karl Itoawkins, Destiny on 07/18/2020 17:03:09 -------------------------------------------------------------------------------- Vitals Details Patient Name: Date of Service: Amber [name], Amber L. 07/18/2020 1:15 PM Medical Record Number: [id number] Patient Account Number: [id number] Date of Birth/Sex: Treating RN: 03/23/1990 (30 y.o. Female) Amber AbtsLynch, Shatara Primary Care Ariadne Rissmiller: Amber CrutchMarkley, Linda Other Clinician: Referring Real Cona: Treating Madinah Quarry/Extender: Amber Danesobson, Michael Markley, Linda Weeks in Treatment: 0 Vital Signs Time Taken: 13:15 Temperature (F): 98.0 Height (in): 62 Pulse (bpm): 56 Source: Stated Respiratory Rate (breaths/min): 18 Weight (lbs): 102 Blood Pressure (mmHg): 104/56 Source: Stated Reference Range: 80 - 120 mg / dl Body Mass Index (BMI): 18.7 Electronic Signature(s) Signed: 07/18/2020 5:22:20 PM By: Karl Itoawkins, Destiny Entered By: Karl Itoawkins, Destiny on 07/18/2020 13:16:02

## 2020-07-18 NOTE — Progress Notes (Signed)
[name], Amber L. ([id number]) Visit Report for 07/18/2020 Abuse/Suicide Risk Screen Details Patient Name: Date of Service: Amber [name], LEITER 07/18/2020 1:15 PM Medical Record Number: [id number] Patient Account Number: [id number] Date of Birth/Sex: Treating RN: 20-Sep-1990 (30 y.o. Female) Shawn Stall Primary Care Ryland Tungate: Shane Crutch Other Clinician: Referring Diamante Rubin: Treating Levester Waldridge/Extender: [name] in Treatment: 0 Abuse/Suicide Risk Screen Items Answer ABUSE RISK SCREEN: Has anyone close to you tried to hurt or harm you recentlyo No Do you feel uncomfortable with anyone in your familyo No Has anyone forced you do things that you didnt want to doo No Electronic Signature(s) Signed: 07/18/2020 6:50:03 PM By: Shawn Stall Entered By: Shawn Stall on 07/18/2020 13:47:31 -------------------------------------------------------------------------------- Activities of Daily Living Details Patient Name: Date of Service: Amber [name], DIVELBISS 07/18/2020 1:15 PM Medical Record Number: [id number] Patient Account Number: [id number] Date of Birth/Sex: Treating RN: [birth year] (30 y.o. Female) Shawn Stall Primary Care Neal Trulson: Shane Crutch Other Clinician: Referring Ruari Duggan: Treating Nell Gales/Extender: [name] in Treatment: 0 Activities of Daily Living Items Answer Activities of Daily Living (Please select one for each item) Drive Automobile Completely Able T Medications ake Completely Able Use T elephone Completely Able Care for Appearance Completely Able Use T oilet Completely Able Bath / Shower Completely Able Dress Self Completely Able Feed Self Completely Able Walk Completely Able Get In / Out Bed Completely Able Housework Completely Able Prepare Meals Completely Able Handle Money Completely Able Shop for Self Completely Able Electronic Signature(s) Signed: 07/18/2020 6:50:03 PM By: Shawn Stall Entered By:  Shawn Stall on 07/18/2020 13:47:50 -------------------------------------------------------------------------------- Education Screening Details Patient Name: Date of Service: Amber [name]. 07/18/2020 1:15 PM Medical Record Number: [id number] Patient Account Number: [id number] Date of Birth/Sex: Treating RN: 1990/08/20 (30 y.o. Female) Shawn Stall Primary Care Krystalyn Kubota: Shane Crutch Other Clinician: Referring Zaccheus Edmister: Treating Verba Ainley/Extender: [name] in Treatment: 0 Primary Learner Assessed: Patient Learning Preferences/Education Level/Primary Language Learning Preference: Explanation, Demonstration, Printed Material Highest Education Level: College or Above Preferred Language: English Cognitive Barrier Language Barrier: No Translator Needed: No Memory Deficit: No Emotional Barrier: No Cultural/Religious Beliefs Affecting Medical Care: No Physical Barrier Impaired Vision: No Impaired Hearing: No Decreased Hand dexterity: No Knowledge/Comprehension Knowledge Level: High Comprehension Level: High Ability to understand written instructions: High Ability to understand verbal instructions: High Motivation Anxiety Level: Calm Cooperation: Cooperative Education Importance: Acknowledges Need Interest in Health Problems: Asks Questions Perception: Coherent Willingness to Engage in Self-Management High Activities: Readiness to Engage in Self-Management High Activities: Electronic Signature(s) Signed: 07/18/2020 6:50:03 PM By: Shawn Stall Entered By: Shawn Stall on 07/18/2020 13:48:15 -------------------------------------------------------------------------------- Fall Risk Assessment Details Patient Name: Date of Service: RO Lorne Skeens. 07/18/2020 1:15 PM Medical Record Number: [id number] Patient Account Number: [id number] Date of Birth/Sex: Treating RN: 10/05/1990 (30 y.o. Female) Shawn Stall Primary Care Zayden Maffei: Shane Crutch Other Clinician: Referring Jaylene Arrowood: Treating Gracen Southwell/Extender: [name] in Treatment: 0 Fall Risk Assessment Items Have you had 2 or more falls in the last 12 monthso 0 No Have you had any fall that resulted in injury in the last 12 monthso 0 No FALLS RISK SCREEN History of falling - immediate or within 3 months 0 No Secondary diagnosis (Do you have 2 or more medical diagnoseso) 0 No Ambulatory aid None/bed rest/wheelchair/nurse 0 Yes Crutches/cane/walker 0 No Furniture 0 No Intravenous therapy Access/Saline/Heparin Lock 0 No Gait/Transferring Normal/ bed rest/ wheelchair 0 Yes Weak (short steps with or  without shuffle, stooped but able to lift head while walking, may seek 0 No support from furniture) Impaired (short steps with shuffle, may have difficulty arising from chair, head down, impaired 0 No balance) Mental Status Oriented to own ability 0 Yes Electronic Signature(s) Signed: 07/18/2020 6:50:03 PM By: Shawn Stall Entered By: Shawn Stall on 07/18/2020 13:48:40 -------------------------------------------------------------------------------- Foot Assessment Details Patient Name: Date of Service: Amber [name]. 07/18/2020 1:15 PM Medical Record Number: [id number] Patient Account Number: [id number] Date of Birth/Sex: Treating RN: 05/27/1990 (30 y.o. Female) Shawn Stall Primary Care Darnisha Vernet: Shane Crutch Other Clinician: Referring Angeleigh Chiasson: Treating Mayme Profeta/Extender: [name] in Treatment: 0 Foot Assessment Items Site Locations + = Sensation present, - = Sensation absent, C = Callus, U = Ulcer R = Redness, W = Warmth, M = Maceration, PU = Pre-ulcerative lesion F = Fissure, S = Swelling, D = Dryness Assessment Right: Left: Other Deformity: No No Prior Foot Ulcer: No No Prior Amputation: No No Charcot Joint: No No Ambulatory Status: Ambulatory Without Help Gait: Steady Electronic  Signature(s) Signed: 07/18/2020 6:50:03 PM By: Shawn Stall Entered By: Shawn Stall on 07/18/2020 14:00:51 -------------------------------------------------------------------------------- Nutrition Risk Screening Details Patient Name: Date of Service: Amber [name], Amber [name] 07/18/2020 1:15 PM Medical Record Number: [id number] Patient Account Number: [id number] Date of Birth/Sex: Treating RN: 1990/06/17 (30 y.o. Female) Shawn Stall Primary Care Gianni [name]: Shane Crutch Other Clinician: Referring Bani Gianfrancesco: Treating Neelam Tiggs/Extender: [name] in Treatment: 0 Height (in): 62 Weight (lbs): 102 Body Mass Index (BMI): 18.7 Nutrition Risk Screening Items Score Screening NUTRITION RISK SCREEN: I have an illness or condition that made me change the kind and/or amount of food I eat 0 No I eat fewer than two meals per day 0 No I eat few fruits and vegetables, or milk products 0 No I have three or more drinks of beer, liquor or wine almost every day 0 No I have tooth or mouth problems that make it hard for me to eat 0 No I don't always have enough money to buy the food I need 0 No I eat alone most of the time 0 No I take three or more different prescribed or over-the-counter drugs a day 1 Yes Without wanting to, I have lost or gained 10 pounds in the last six months 0 No I am not always physically able to shop, cook and/or feed myself 0 No Nutrition Protocols Good Risk Protocol 0 No interventions needed Moderate Risk Protocol High Risk Proctocol Risk Level: Good Risk Score: 1 Electronic Signature(s) Signed: 07/18/2020 6:50:03 PM By: Shawn Stall Entered By: Shawn Stall on 07/18/2020 13:48:57
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
## 2020-07-20 LAB — DRUG MONITORING, PANEL 7 WITH CONFIRMATION, URINE
6 Acetylmorphine: NEGATIVE ng/mL (ref <10)
Alcohol Metabolites: NEGATIVE ng/mL (ref <500)
Amphetamines: NEGATIVE ng/mL (ref <500)
Barbiturates: NEGATIVE ng/mL (ref <300)
Benzodiazepines: NEGATIVE ng/mL (ref <100)
Cocaine Metabolite: NEGATIVE ng/mL (ref <150)
Creatinine: 86.9 mg/dL (ref >=20.0)
Marijuana Metabolite: 3478 ng/mL — ABNORMAL HIGH (ref <5)
Marijuana Metabolite: POSITIVE ng/mL — AB (ref <20)
Methadone Metabolite: NEGATIVE ng/mL (ref <100)
Opiates: NEGATIVE ng/mL (ref <100)
Oxidant: NEGATIVE ug/mL (ref <200)
Oxycodone: NEGATIVE ng/mL (ref <100)
pH: 6.6 (ref 4.5–9.0)

## 2020-07-20 LAB — DM TEMPLATE

## 2020-07-22 LAB — AEROBIC CULTURE W GRAM STAIN (SUPERFICIAL SPECIMEN): Gram Stain: NONE SEEN

## 2020-07-25 ENCOUNTER — Other Ambulatory Visit: Payer: Self-pay

## 2020-07-25 ENCOUNTER — Encounter (HOSPITAL_BASED_OUTPATIENT_CLINIC_OR_DEPARTMENT_OTHER): Payer: Medicaid Other | Admitting: Internal Medicine
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
## 2020-07-30 NOTE — Progress Notes (Signed)
Brandenburger, Joss L. ([id number]) Visit Report for 07/25/2020 Arrival Information Details Patient Name: Date of Service: Amber [name], Amber [name] 07/25/2020 3:30 PM Medical Record Number: [id number] Patient Account Number: [id number] Date of Birth/Sex: Treating RN: April 17, 1990 (30 y.o. Toniann Fail Primary Care Davisha Linth[place]: [place], [place] KZ Other Clinician: Referring Corean Yoshimura: Treating Vickii Volland/Extender: Lehman Prom, SCO TT Weeks in Treatment: 1 Visit Information History Since Last Visit Added or deleted any medications: No Patient Arrived: Ambulatory Any new allergies or adverse reactions: No Arrival Time: 15:55 Had a fall or experienced change in No Accompanied By: self activities of daily living that may affect Transfer Assistance: None risk of falls: Patient Identification Verified: Yes Signs or symptoms of abuse/neglect since last visito No Secondary Verification Process Completed: Yes Hospitalized since last visit: No Patient Requires Transmission-Based Precautions: No Implantable device outside of the clinic excluding No Patient Has Alerts: No cellular tissue based products placed in the center since last visit: Has Dressing in Place as Prescribed: Yes Pain Present Now: No Electronic Signature(s) Signed: 07/25/2020 6:12:45 PM By: Fonnie Mu RN Entered By: Fonnie Mu on 07/25/2020 15:55:52 -------------------------------------------------------------------------------- Clinic Level of Care Assessment Details Patient Name: Date of Service: Amber [name] 07/25/2020 3:30 PM Medical Record Number: [id number] Patient Account Number: [id number] Date of Birth/Sex: Treating RN: 06-17-1990 (30 y.o. Wynelle Link Primary Care Trayven Lumadue: [place], [place] TD Other Clinician: Referring Kyler Germer: Treating Travon Crochet/Extender: Lehman Prom, SCO TT Weeks in Treatment: 1 Clinic Level of Care Assessment Items TOOL 4 Quantity Score X- 1 0 Use when only an  EandM is performed on FOLLOW-UP visit ASSESSMENTS - Nursing Assessment / Reassessment X- 1 10 Reassessment of Co-morbidities (includes updates in patient status) X- 1 5 Reassessment of Adherence to Treatment Plan ASSESSMENTS - Wound and Skin A ssessment / Reassessment X - Simple Wound Assessment / Reassessment - one wound 1 5 []  - 0 Complex Wound Assessment / Reassessment - multiple wounds []  - 0 Dermatologic / Skin Assessment (not related to wound area) ASSESSMENTS - Focused Assessment []  - 0 Circumferential Edema Measurements - multi extremities []  - 0 Nutritional Assessment / Counseling / Intervention X- 1 5 Lower Extremity Assessment (monofilament, tuning fork, pulses) []  - 0 Peripheral Arterial Disease Assessment (using hand held doppler) ASSESSMENTS - Ostomy and/or Continence Assessment and Care []  - 0 Incontinence Assessment and Management []  - 0 Ostomy Care Assessment and Management (repouching, etc.) PROCESS - Coordination of Care X - Simple Patient / Family Education for ongoing care 1 15 []  - 0 Complex (extensive) Patient / Family Education for ongoing care X- 1 10 Staff obtains , Records, T Results / Process Orders est []  - 0 Staff telephones HHA, Nursing Homes / Clarify orders / etc []  - 0 Routine Transfer to another Facility (non-emergent condition) []  - 0 Routine Hospital Admission (non-emergent condition) []  - 0 New Admissions / / Ordering NPWT Apligraf, etc. , []  - 0 Emergency Hospital Admission (emergent condition) X- 1 10 Simple Discharge Coordination []  - 0 Complex (extensive) Discharge Coordination PROCESS - Special Needs []  - 0 Pediatric / Minor Patient Management []  - 0 Isolation Patient Management []  - 0 Hearing / Language / Visual special needs []  - 0 Assessment of Community assistance (transportation, D/C planning, etc.) []  - 0 Additional assistance / Altered mentation []  - 0 Support Surface(s)  Assessment (bed, cushion, seat, etc.) INTERVENTIONS - Wound Cleansing / Measurement X - Simple Wound Cleansing - one wound 1 5 []  - 0 Complex  Wound Cleansing - multiple wounds X- 1 5 Wound Imaging (photographs - any number of wounds) []  - 0 Wound Tracing (instead of photographs) X- 1 5 Simple Wound Measurement - one wound []  - 0 Complex Wound Measurement - multiple wounds INTERVENTIONS - Wound Dressings X - Small Wound Dressing one or multiple wounds 1 10 []  - 0 Medium Wound Dressing one or multiple wounds []  - 0 Large Wound Dressing one or multiple wounds X- 1 5 Application of Medications - topical []  - 0 Application of Medications - injection INTERVENTIONS - Miscellaneous []  - 0 External ear exam []  - 0 Specimen Collection (cultures, biopsies, blood, body fluids, etc.) []  - 0 Specimen(s) / Culture(s) sent or taken to Lab for analysis []  - 0 Patient Transfer (multiple staff / / Similar devices) []  - 0 Simple Staple / Suture removal (25 or less) []  - 0 Complex Staple / Suture removal (26 or more) []  - 0 Hypo / Hyperglycemic Management (close monitor of Blood Glucose) []  - 0 Ankle / Brachial Index (ABI) - do not check if billed separately X- 1 5 Vital Signs Has the patient been seen at the hospital within the last three years: Yes Total Score: 95 Level Of Care: New/Established - Level 3 Electronic Signature(s) Signed: 07/26/2020 6:01:10 PM By: RN, BSN Entered By: on 07/25/2020 16:25:20 -------------------------------------------------------------------------------- Encounter Discharge Information Details Patient Name: Date of Service: L. 07/25/2020 3:30 PM Medical Record Number: Patient Account Number: Date of Birth/Sex: Treating RN: [birth year] (30 y.o. Primary Care Wahneta Derocher: [place],  Other Clinician: Referring Owens Hara: Treating Domenique Quest/Extender: 07/28/2020, SCO TT Weeks in Treatment: 1 Encounter Discharge Information Items Discharge Condition: Stable Ambulatory Status: Ambulatory Discharge Destination: Home Transportation: Private Auto Accompanied By: mother Schedule Follow-up Appointment: Yes Clinical Summary of Care: Patient Declined Electronic Signature(s) Signed: 07/26/2020 6:01:10 PM By: Zandra Abts RN, BSN Entered By: 07/27/2020 on 07/25/2020 16:26:20 -------------------------------------------------------------------------------- Lower Extremity Assessment Details Patient Name: Date of Service: 07/27/2020. 07/25/2020 3:30 PM Medical Record Number: [id number] Patient Account Number: 03/17/1990 Date of Birth/Sex: Treating RN: 11/20/1990 (30 y.o. [place] Primary Care Breccan Galant: [place], [place] Lehman Prom Other Clinician: Referring Jeromie Gainor: Treating Riel Hirschman/Extender: 07/28/2020, SCO TT Weeks in Treatment: 1 Edema Assessment Assessed: [Left: No] [Right: Yes] Edema: [Left: N] [Right: o] Calf Left: Right: Point of Measurement: 26 cm From Medial Instep 30 cm Ankle Left: Right: Point of Measurement: 7 cm From Medial Instep 19 cm Vascular Assessment Pulses: Dorsalis Pedis Palpable: [Right:Yes] Posterior Tibial Palpable: [Right:Yes] Electronic Signature(s) Signed: 07/25/2020 6:12:45 PM By: Zandra Abts RN Entered By: 07/27/2020 on 07/25/2020 16:06:02 -------------------------------------------------------------------------------- Multi Wound Chart Details Patient Name: Date of Service: 07/27/2020, Nevah L. 07/25/2020 3:30 PM Medical Record Number: [id number] Patient Account Number: 03/17/1990 Date of Birth/Sex: Treating RN: [birth year] (30 y.o. [place] Primary Care Prisilla Kocsis: [place], [place] Lehman Prom Other Clinician: Referring Deshanta Lady: Treating Mehmet Scally/Extender: 07/27/2020, SCO TT Weeks in Treatment: 1 Vital Signs Height(in): 62 Pulse(bpm):  65 Weight(lbs): 102 Blood Pressure(mmHg): 100/66 Body Mass Index(BMI): 19 Temperature(F): 97.7 Respiratory Rate(breaths/min): 17 Photos: [1:No Photos Right, Anterior Lower Leg] [N/A:N/A N/A] Wound Location: [1:Gradually Appeared] [N/A:N/A] Wounding Event: [1:Abscess] [N/A:N/A] Primary Etiology: [1:Hepatitis C, Neuropathy] [N/A:N/A] Comorbid History: [1:06/21/2020] [N/A:N/A] Date Acquired: [1:1] [N/A:N/A] Weeks of Treatment: [1:Open] [N/A:N/A] Wound Status: [1:1x0.5x0.7] [N/A:N/A] Measurements L x W x D (cm) [1:0.393] [N/A:N/A] A (cm) : rea [1:0.275] [N/A:N/A]  Volume (cm) : [1:-233.10%] [N/A:N/A] % Reduction in A rea: [1:-1045.80%] [N/A:N/A] % Reduction in Volume: [1:12] Starting Position 1 (o'clock): [1:12] Ending Position 1 (o'clock): [1:1.8] Maximum Distance 1 (cm): [1:Yes] [N/A:N/A] Undermining: [1:Full Thickness Without Exposed] [N/A:N/A] Classification: [1:Support Structures Medium] [N/A:N/A] Exudate Amount: [1:Purulent] [N/A:N/A] Exudate Type: [1:yellow, brown, green] [N/A:N/A] Exudate Color: [1:Distinct, outline attached] [N/A:N/A] Wound Margin: [1:Medium (34-66%)] [N/A:N/A] Granulation Amount: [1:Red, Pink] [N/A:N/A] Granulation Quality: [1:Medium (34-66%)] [N/A:N/A] Necrotic Amount: [1:Fat Layer (Subcutaneous Tissue): Yes N/A] Exposed Structures: [1:Fascia: No Tendon: No Muscle: No Joint: No Bone: No Small (1-33%)] [N/A:N/A] Treatment Notes Wound #1 (Lower Leg) Wound Laterality: Right, Anterior Cleanser Soap and Water Discharge Instruction: May shower and wash wound with dial antibacterial soap and water prior to dressing change. Peri-Wound Care Skin Prep Discharge Instruction: Use skin prep as directed Topical Mupirocin Ointment Discharge Instruction: Apply Mupirocin (Bactroban) to wound bed, under alginate Primary Dressing KerraCel Ag Gelling Fiber Dressing, 2x2 in (silver alginate) Discharge Instruction: Apply silver alginate to wound bed as  instructed Secondary Dressing Bordered Gauze, 2x2 in Discharge Instruction: Apply over primary dressing as directed. Secured With Compression Wrap Compression Stockings Facilities managerAdd-Ons Electronic Signature(s) Signed: 07/25/2020 5:03:28 PM By: Baltazar Najjarobson, Michael MD Signed: 07/26/2020 6:01:10 PM By: Zandra AbtsLynch, Shatara RN, BSN Entered By: Baltazar Najjarobson, Michael on 07/25/2020 16:47:11 -------------------------------------------------------------------------------- Multi-Disciplinary Care Plan Details Patient Name: Date of Service: Christiana FuchsO BINSO N, Andreal L. 07/25/2020 3:30 PM Medical Record Number: [id number] Patient Account Number: [id number] Date of Birth/Sex: Treating RN: 05/12/1990 (30 y.o. [name]) Lynch, Shatara Primary Care T[place]onda Pinkus: [place] Other Clinician: Referring Dionisia Pacholski: Treating Michalle Rademaker/Extender: Lehman Promobson, Michael CENTER, SCO TT Weeks in Treatment: 1 Multidisciplinary Care Plan reviewed with physician Active Inactive Wound/Skin Impairment Nursing Diagnoses: Impaired tissue integrity Knowledge deficit related to ulceration/compromised skin integrity Goals: Patient/caregiver will verbalize understanding of skin care regimen Date Initiated: 07/18/2020 Target Resolution Date: 08/17/2020 Goal Status: Active Ulcer/skin breakdown will have a volume reduction of 30% by week 4 Date Initiated: 07/18/2020 Target Resolution Date: 08/17/2020 Goal Status: Active Interventions: Assess patient/caregiver ability to obtain necessary supplies Assess patient/caregiver ability to perform ulcer/skin care regimen upon admission and as needed Assess ulceration(s) every visit Provide education on ulcer and skin care Notes: Electronic Signature(s) Signed: 07/26/2020 6:01:10 PM By: Zandra AbtsLynch, Shatara RN, BSN Entered By: Zandra AbtsLynch, Shatara on 07/25/2020 16:19:11 -------------------------------------------------------------------------------- Pain Assessment Details Patient Name: Date of Service: [name], Anallely L. 07/25/2020  3:30 PM Medical Record Number: [id number] Patient Account Number: [id number] Date of Birth/Sex: Treating RN: 11/25/1990 (30 y.o. Toniann FailF) Breedlove, Lauren Primary Care Teion Ballin: [place] Other Clinician: Referring Jilliam Bellmore: Treating Shauntea Lok/Extender: Lehman Promobson, Michael CENTER, SCO TT Weeks in Treatment: 1 Active Problems Location of Pain Severity and Description of Pain Patient Has Paino No Site Locations Pain Management and Medication Current Pain Management: Electronic Signature(s) Signed: 07/25/2020 6:12:45 PM By: Fonnie MuBreedlove, Lauren RN Entered By: Fonnie MuBreedlove, Lauren on 07/25/2020 16:05:53 -------------------------------------------------------------------------------- Patient/Caregiver Education Details Patient Name: Date of Service: RO Lorne SkeensBINSO N, Danise L. 7/13/2022andnbsp3:30 PM Medical Record Number: [id number] Patient Account Number: [id number] Date of Birth/Gender: Treating RN: 06/29/1990 (30 y.o. [name]) Lynch, Shatara Primary Care Physician: [place] Other Clinician: Referring Physician: Treating Physician/Extender: Lehman Promobson, Michael CENTER, SCO TT Weeks in Treatment: 1 Education Assessment Education Provided To: Patient Education Topics Provided Wound/Skin Impairment: Methods: Explain/Verbal Responses: State content correctly Nash-Finch CompanyElectronic Signature(s) Signed: 07/26/2020 6:01:10 PM By: Zandra AbtsLynch, Shatara RN, BSN Entered By: Zandra AbtsLynch, Shatara on 07/25/2020 16:20:05 -------------------------------------------------------------------------------- Wound Assessment Details Patient Name: Date of Service: [name], Radiance L. 07/25/2020 3:30 PM  Medical Record Number: [id number] Patient Account Number: [id number] Date of Birth/Sex: Treating RN: 03-03-1990 (30 y.o. Toniann Fail Primary Care Shawntee Mainwaring: [place], [place] ZS Other Clinician: Referring Tonjia Parillo: Treating Carolee Channell/Extender: Lehman Prom, SCO TT Weeks in Treatment: 1 Wound Status Wound Number: 1 Primary  Etiology: Abscess Wound Location: Right, Anterior Lower Leg Wound Status: Open Wounding Event: Gradually Appeared Comorbid History: Hepatitis C, Neuropathy Date Acquired: 06/21/2020 Weeks Of Treatment: 1 Clustered Wound: No Photos Wound Measurements Length: (cm) 1 Width: (cm) 0.5 Depth: (cm) 0.7 Area: (cm) 0.393 Volume: (cm) 0.275 % Reduction in Area: -233.1% % Reduction in Volume: -1045.8% Epithelialization: Small (1-33%) Tunneling: No Undermining: Yes Starting Position (o'clock): 12 Ending Position (o'clock): 12 Maximum Distance: (cm) 1.8 Wound Description Classification: Full Thickness Without Exposed Support Structures Wound Margin: Distinct, outline attached Exudate Amount: Medium Exudate Type: Purulent Exudate Color: yellow, brown, green Foul Odor After Cleansing: No Slough/Fibrino Yes Wound Bed Granulation Amount: Medium (34-66%) Exposed Structure Granulation Quality: Red, Pink Fascia Exposed: No Necrotic Amount: Medium (34-66%) Fat Layer (Subcutaneous Tissue) Exposed: Yes Necrotic Quality: Adherent Slough Tendon Exposed: No Muscle Exposed: No Joint Exposed: No Bone Exposed: No Treatment Notes Wound #1 (Lower Leg) Wound Laterality: Right, Anterior Cleanser Soap and Water Discharge Instruction: May shower and wash wound with dial antibacterial soap and water prior to dressing change. Peri-Wound Care Skin Prep Discharge Instruction: Use skin prep as directed Topical Mupirocin Ointment Discharge Instruction: Apply Mupirocin (Bactroban) to wound bed, under alginate Primary Dressing KerraCel Ag Gelling Fiber Dressing, 2x2 in (silver alginate) Discharge Instruction: Apply silver alginate to wound bed as instructed Secondary Dressing Bordered Gauze, 2x2 in Discharge Instruction: Apply over primary dressing as directed. Secured With Compression Wrap Compression Stockings Facilities manager) Signed: 07/26/2020 6:01:49 PM By: Fonnie Mu  RN Signed: 07/30/2020 3:49:49 PM By: Karl Ito Previous Signature: 07/25/2020 6:12:45 PM Version By: Fonnie Mu RN Entered By: Karl Ito on 07/26/2020 08:54:23 -------------------------------------------------------------------------------- Vitals Details Patient Name: Date of Service: [name], Shamell L. 07/25/2020 3:30 PM Medical Record Number: [id number] Patient Account Number: [id number] Date of Birth/Sex: Treating RN: [birth year] (30 y.o. Toniann Fail Primary Care Zelia Yzaguirre: [place], [place] DD Other Clinician: Referring Javian Nudd: Treating Kienan Doublin/Extender: Lehman Prom, SCO TT Weeks in Treatment: 1 Vital Signs Time Taken: 15:58 Temperature (F): 97.7 Height (in): 62 Pulse (bpm): 65 Weight (lbs): 102 Respiratory Rate (breaths/min): 17 Body Mass Index (BMI): 18.7 Blood Pressure (mmHg): 100/66 Reference Range: 80 - 120 mg / dl Electronic Signature(s) Signed: 07/25/2020 6:12:45 PM By: Fonnie Mu RN Entered By: Fonnie Mu on 07/25/2020 15:58:33

## 2020-08-01 ENCOUNTER — Encounter (HOSPITAL_BASED_OUTPATIENT_CLINIC_OR_DEPARTMENT_OTHER): Payer: Medicaid Other | Admitting: Internal Medicine

## 2020-08-01 ENCOUNTER — Other Ambulatory Visit: Payer: Self-pay

## 2020-08-06 NOTE — Progress Notes (Signed)
[name], Amber L. ([id number]) Visit Report for 08/01/2020 Arrival Information Details Patient Name: Date of Service: Amber [name], Amber L. 08/01/2020 1:30 PM Medical Record Number: [id number] Patient Account Number: [id number] Date of Birth/Sex: Treating RN: 10/04/1990 (30 y.o. Amber [name]) [name], Amber Primary Care Amber [name]: Amber [name], [place] QMTT Other Clinician: Referring Amber [name]: Treating Amber [name]/Extender: Amber [name], Amber [name] CENTER, [facility] TT Weeks in Treatment: 2 Visit Information History Since Last Visit Added or deleted any medications: No Patient Arrived: Ambulatory Any new allergies or adverse reactions: No Arrival Time: 13:40 Had a fall or experienced change in No Accompanied By: alone activities of daily living that may affect Transfer Assistance: None risk of falls: Patient Identification Verified: Yes Signs or symptoms of abuse/neglect since last visito No Secondary Verification Process Completed: Yes Hospitalized since last visit: No Patient Requires Transmission-Based Precautions: No Implantable device outside of the clinic excluding No Patient Has Alerts: No cellular tissue based products placed in the center since last visit: Has Dressing in Place as Prescribed: Yes Pain Present Now: Yes Electronic Signature(s) Signed: 08/06/2020 4:44:07 PM By: Amber [name], Shatara RN, BSN Entered By: Amber [name], Amber on 08/01/2020 13:40:28 -------------------------------------------------------------------------------- Clinic Level of Care Assessment Details Patient Name: Date of Service: Amber [name], Amber L. 08/01/2020 1:30 PM Medical Record Number: [id number] Patient Account Number: [id number] Date of Birth/Sex: Treating RN: 10/25/1990 (30 y.o. Amber [name]) [name], Amber Primary Care Malory Spurr: Amber [name], [place] BMTT Other Clinician: Referring Kadyn Guild: Treating Amber [name]/Extender: Amber [name], Amber [name] CENTER, [facility] TT Weeks in Treatment: 2 Clinic Level of Care Assessment Items TOOL 4 Quantity Score X- 1 0 Use when only an  EandM is performed on FOLLOW-UP visit ASSESSMENTS - Nursing Assessment / Reassessment X- 1 10 Reassessment of Co-morbidities (includes updates in patient status) X- 1 5 Reassessment of Adherence to Treatment Plan ASSESSMENTS - Wound and Skin A ssessment / Reassessment X - Simple Wound Assessment / Reassessment - one wound 1 5 []  - 0 Complex Wound Assessment / Reassessment - multiple wounds []  - 0 Dermatologic / Skin Assessment (not related to wound area) ASSESSMENTS - Focused Assessment []  - 0 Circumferential Edema Measurements - multi extremities []  - 0 Nutritional Assessment / Counseling / Intervention []  - 0 Lower Extremity Assessment (monofilament, tuning fork, pulses) []  - 0 Peripheral Arterial Disease Assessment (using hand held doppler) ASSESSMENTS - Ostomy and/or Continence Assessment and Care []  - 0 Incontinence Assessment and Management []  - 0 Ostomy Care Assessment and Management (repouching, etc.) PROCESS - Coordination of Care X - Simple Patient / Family Education for ongoing care 1 15 []  - 0 Complex (extensive) Patient / Family Education for ongoing care X- 1 10 Staff obtains ChiropractorConsents, Records, T Results / Process Orders est []  - 0 Staff telephones HHA, Nursing Homes / Clarify orders / etc []  - 0 Routine Transfer to another Facility (non-emergent condition) []  - 0 Routine Hospital Admission (non-emergent condition) []  - 0 New Admissions / Manufacturing engineernsurance Authorizations / Ordering NPWT Apligraf, etc. , []  - 0 Emergency Hospital Admission (emergent condition) X- 1 10 Simple Discharge Coordination []  - 0 Complex (extensive) Discharge Coordination PROCESS - Special Needs []  - 0 Pediatric / Minor Patient Management []  - 0 Isolation Patient Management []  - 0 Hearing / Language / Visual special needs []  - 0 Assessment of Community assistance (transportation, D/C planning, etc.) []  - 0 Additional assistance / Altered mentation []  - 0 Support Surface(s)  Assessment (bed, cushion, seat, etc.) INTERVENTIONS - Wound Cleansing / Measurement X - Simple Wound Cleansing - one wound 1 5 []  - 0  Complex Wound Cleansing - multiple wounds X- 1 5 Wound Imaging (photographs - any number of wounds) []  - 0 Wound Tracing (instead of photographs) X- 1 5 Simple Wound Measurement - one wound []  - 0 Complex Wound Measurement - multiple wounds INTERVENTIONS - Wound Dressings X - Small Wound Dressing one or multiple wounds 1 10 []  - 0 Medium Wound Dressing one or multiple wounds []  - 0 Large Wound Dressing one or multiple wounds X- 1 5 Application of Medications - topical []  - 0 Application of Medications - injection INTERVENTIONS - Miscellaneous []  - 0 External ear exam []  - 0 Specimen Collection (cultures, biopsies, blood, body fluids, etc.) []  - 0 Specimen(s) / Culture(s) sent or taken to Lab for analysis []  - 0 Patient Transfer (multiple staff / / Similar devices) []  - 0 Simple Staple / Suture removal (25 or less) []  - 0 Complex Staple / Suture removal (26 or more) []  - 0 Hypo / Hyperglycemic Management (close monitor of Blood Glucose) []  - 0 Ankle / Brachial Index (ABI) - do not check if billed separately X- 1 5 Vital Signs Has the patient been seen at the hospital within the last three years: Yes Total Score: 90 Level Of Care: New/Established - Level 3 Electronic Signature(s) Signed: 08/06/2020 4:44:07 PM By: RN, BSN Entered By: on 08/01/2020 18:09:01 -------------------------------------------------------------------------------- Encounter Discharge Information Details Patient Name: Date of Service: , Amber L. 08/01/2020 1:30 PM Medical Record Number: Patient Account Number: Date of Birth/Sex: Treating RN: 1990/12/30 (30 y.o. Primary Care Syed Zukas: [place],  Other Clinician: Referring Armarion Greek: Treating Keano Guggenheim/Extender: 08/08/2020, [facility] TT Weeks in Treatment: 2 Encounter Discharge Information Items Discharge Condition: Stable Ambulatory Status: Ambulatory Discharge Destination: Home Transportation: Private Auto Accompanied By: self Schedule Follow-up Appointment: Yes Clinical Summary of Care: Patient Declined Electronic Signature(s) Signed: 08/02/2020 7:48:44 PM By: Amber Abts RN Entered By: 08/03/2020 on 08/01/2020 16:53:10 -------------------------------------------------------------------------------- Lower Extremity Assessment Details Patient Name: Date of Service: 08/03/2020. 08/01/2020 1:30 PM Medical Record Number: [id number] Patient Account Number: 03/17/1990 Date of Birth/Sex: Treating RN: 1990/12/22 (30 y.o. [place] Primary Care Jacody Beneke: [place], [place] Amber Prom Other Clinician: Referring Audi Conover: Treating Taje Littler/Extender: 08/04/2020, [facility] TT Weeks in Treatment: 2 Edema Assessment Assessed: [Left: No] [Right: No] Edema: [Left: [name]] [Right: o] Calf Left: Right: Point of Measurement: 26 cm From Medial Instep 30 cm Ankle Left: Right: Point of Measurement: 7 cm From Medial Instep 19 cm Vascular Assessment Pulses: Dorsalis Pedis Palpable: [Right:Yes] Electronic Signature(s) Signed: 08/06/2020 4:44:07 PM By: Fonnie Mu RN, BSN Entered By: 08/03/2020 on 08/01/2020 13:44:28 -------------------------------------------------------------------------------- Multi Wound Chart Details Patient Name: Date of Service: 08/03/2020, Keshara L. 08/01/2020 1:30 PM Medical Record Number: [id number] Patient Account Number: 03/17/1990 Date of Birth/Sex: Treating RN: 11/07/1990 (30 y.o. [place] Primary Care Sinclaire Artiga: [place], [place] Amber Prom Other Clinician: Referring Nikkolas Coomes: Treating Gracielynn Birkel/Extender: 08/08/2020, [facility] TT Weeks in Treatment: 2 Vital Signs Height(in): 62 Pulse(bpm): 59 Weight(lbs): 102 Blood Pressure(mmHg): 99/67 Body Mass  Index(BMI): 19 Temperature(F): 97.9 Respiratory Rate(breaths/min): 16 Photos: [1:No Photos Right, Anterior Lower Leg] [[name]/A:[name]/A [name]/A] Wound Location: [1:Gradually Appeared] [[name]/A:[name]/A] Wounding Event: [1:Abscess] [[name]/A:[name]/A] Primary Etiology: [1:Hepatitis C, Neuropathy] [[name]/A:[name]/A] Comorbid History: [1:06/21/2020] [[name]/A:[name]/A] Date Acquired: [1:2] [[name]/A:[name]/A] Weeks of Treatment: [1:Open] [[name]/A:[name]/A] Wound Status: [1:1x0.5x0.4] [[name]/A:[name]/A] Measurements L x W x D (cm) [1:0.393] [[name]/A:[name]/A] A (cm) : rea [1:0.157] [[name]/A:[name]/A] Volume (cm) : [  1:-233.10%] [[name]/A:[name]/A] % Reduction in A rea: [1:-554.20%] [[name]/A:[name]/A] % Reduction in Volume: [1:12] Starting Position 1 (o'clock): [1:6] Ending Position 1 (o'clock): [1:1.5] Maximum Distance 1 (cm): [1:Yes] [[name]/A:[name]/A] Undermining: [1:Full Thickness Without Exposed] [[name]/A:[name]/A] Classification: [1:Support Structures Medium] [[name]/A:[name]/A] Exudate Amount: [1:Serosanguineous] [[name]/A:[name]/A] Exudate Type: [1:red, brown] [[name]/A:[name]/A] Exudate Color: [1:Distinct, outline attached] [[name]/A:[name]/A] Wound Margin: [1:Medium (34-66%)] [[name]/A:[name]/A] Granulation Amount: [1:Red, Pink] [[name]/A:[name]/A] Granulation Quality: [1:Medium (34-66%)] [[name]/A:[name]/A] Necrotic Amount: [1:Fat Layer (Subcutaneous Tissue): Yes [name]/A] Exposed Structures: [1:Fascia: No Tendon: No Muscle: No Joint: No Bone: No Small (1-33%)] [[name]/A:[name]/A] Treatment Notes Electronic Signature(s) Signed: 08/01/2020 4:58:30 PM By: Baltazar Najjar MD Signed: 08/06/2020 4:44:07 PM By: Amber Abts RN, BSN Entered By: Baltazar Najjar on 08/01/2020 13:57:16 -------------------------------------------------------------------------------- Multi-Disciplinary Care Plan Details Patient Name: Date of Service: Amber [name], Amber L. 08/01/2020 1:30 PM Medical Record Number: [id number] Patient Account Number: [id number] Date of Birth/Sex: Treating RN: March 28, 1990 (30 y.o. Amber Link Primary Care Britny Riel: [place], [place] Y[place] Other Clinician: Referring Belvin Gauss: Treating  Lorene Klimas/Extender: Amber Prom, [facility] TT Weeks in Treatment: 2 Multidisciplinary Care Plan reviewed with physician Active Inactive Wound/Skin Impairment Nursing Diagnoses: Impaired tissue integrity Knowledge deficit related to ulceration/compromised skin integrity Goals: Patient/caregiver will verbalize understanding of skin care regimen Date Initiated: 07/18/2020 Target Resolution Date: 08/17/2020 Goal Status: Active Ulcer/skin breakdown will have a volume reduction of 30% by week 4 Date Initiated: 07/18/2020 Target Resolution Date: 08/17/2020 Goal Status: Active Interventions: Assess patient/caregiver ability to obtain necessary supplies Assess patient/caregiver ability to perform ulcer/skin care regimen upon admission and as needed Assess ulceration(s) every visit Provide education on ulcer and skin care Notes: Electronic Signature(s) Signed: 08/06/2020 4:44:07 PM By: Amber Abts RN, BSN Entered By: Amber Abts on 08/01/2020 18:08:24 -------------------------------------------------------------------------------- Pain Assessment Details Patient Name: Date of Service: Amber [name], Amber L. 08/01/2020 1:30 PM Medical Record Number: [id number] Patient Account Number: [id number] Date of Birth/Sex: Treating RN: 08/20/1990 (30 y.o. Amber Link Primary Care [place] Freyre: [place], [place] [facility] Other Clinician: Referring Ketra Duchesne: Treating Shabnam Ladd/Extender: Amber Prom, [facility] TT Weeks in Treatment: 2 Active Problems Location of Pain Severity and Description of Pain Patient Has Paino Yes Site Locations Pain Location: Pain Location: Pain in Ulcers With Dressing Change: Yes Duration of the Pain. Constant / Intermittento Intermittent Rate the pain. Current Pain Level: 5 Character of Pain Describe the Pain: Tender Pain Management and Medication Current Pain Management: Medication: Yes Cold Application: No Rest: No Massage: No Activity: No T.E.[name].S.: No Heat  Application: No Leg drop or elevation: No Is the Current Pain Management Adequate: Adequate How does your wound impact your activities of daily livingo Sleep: No Bathing: No Appetite: No Relationship With Others: No Bladder Continence: No Emotions: No Bowel Continence: No Work: No Toileting: No Drive: No Dressing: No Hobbies: No Electronic Signature(s) Signed: 08/06/2020 4:44:07 PM By: Amber Abts RN, BSN Entered By: Amber Abts on 08/01/2020 13:41:05 -------------------------------------------------------------------------------- Patient/Caregiver Education Details Patient Name: Date of Service: Amber Amber [name] 7/20/2022andnbsp1:30 PM Medical Record Number: [id number] Patient Account Number: [id number] Date of Birth/Gender: Treating RN: 1990/11/19 (30 y.o. Amber Link Primary Care Physician: [place], [place] EZ Other Clinician: Referring Physician: Treating Physician/Extender: Amber Prom, [facility] TT Weeks in Treatment: 2 Education Assessment Education Provided To: Patient Education Topics Provided Wound/Skin Impairment: Methods: Explain/Verbal Responses: State content correctly [employer]) Signed: 08/06/2020 4:44:07 PM By: Amber Abts RN, BSN Entered By: Amber Abts on 08/01/2020 18:08:34 -------------------------------------------------------------------------------- Wound Assessment Details Patient Name: Date of Service: Amber Winfield Cunas, Amber L. 08/01/2020 1:30  PM Medical Record Number: [id number] Patient Account Number: [id number] Date of Birth/Sex: Treating RN: Jun 02, 1990 (30 y.o. Amber Link Primary Care Undrea Shipes: [place], [place] NK Other Clinician: Referring Thetis Schwimmer: Treating Dujuan Stankowski/Extender: Amber Prom, [facility] TT Weeks in Treatment: 2 Wound Status Wound Number: 1 Primary Etiology: Abscess Wound Location: Right, Anterior Lower Leg Wound Status: Open Wounding Event: Gradually Appeared Comorbid History:  Hepatitis C, Neuropathy Date Acquired: 06/21/2020 Weeks Of Treatment: 2 Clustered Wound: No Photos Photo Uploaded By: Haywood Pao on 08/02/2020 13:57:17 Wound Measurements Length: (cm) 1 Width: (cm) 0.5 Depth: (cm) 0.4 Area: (cm) 0.393 Volume: (cm) 0.157 % Reduction in Area: -233.1% % Reduction in Volume: -554.2% Epithelialization: Small (1-33%) Tunneling: No Undermining: Yes Starting Position (o'clock): 12 Ending Position (o'clock): 6 Maximum Distance: (cm) 1.5 Wound Description Classification: Full Thickness Without Exposed Support Structures Wound Margin: Distinct, outline attached Exudate Amount: Medium Exudate Type: Serosanguineous Exudate Color: red, brown Foul Odor After Cleansing: No Slough/Fibrino Yes Wound Bed Granulation Amount: Medium (34-66%) Exposed Structure Granulation Quality: Red, Pink Fascia Exposed: No Necrotic Amount: Medium (34-66%) Fat Layer (Subcutaneous Tissue) Exposed: Yes Necrotic Quality: Adherent Slough Tendon Exposed: No Muscle Exposed: No Joint Exposed: No Bone Exposed: No Treatment Notes Wound #1 (Lower Leg) Wound Laterality: Right, Anterior Cleanser Soap and Water Discharge Instruction: May shower and wash wound with dial antibacterial soap and water prior to dressing change. Peri-Wound Care Skin Prep Discharge Instruction: Use skin prep as directed Topical Mupirocin Ointment Discharge Instruction: Apply Mupirocin (Bactroban) to wound bed, under alginate Primary Dressing KerraCel Ag Gelling Fiber Dressing, 2x2 in (silver alginate) Discharge Instruction: Apply silver alginate to wound bed as instructed Secondary Dressing Bordered Gauze, 2x2 in Discharge Instruction: Apply over primary dressing as directed. Secured With Compression Wrap Compression Stockings Facilities manager) Signed: 08/06/2020 4:44:07 PM By: Amber Abts RN, BSN Entered By: Amber Abts on 08/01/2020  13:45:20 -------------------------------------------------------------------------------- Vitals Details Patient Name: Date of Service: Amber [name], Amber L. 08/01/2020 1:30 PM Medical Record Number: [id number] Patient Account Number: [id number] Date of Birth/Sex: Treating RN: 09-03-1990 (30 y.o. Amber Link Primary Care Kerrianne Jeng: [place], [place] PF Other Clinician: Referring King Pinzon: Treating Cathi Hazan/Extender: Amber Prom, [facility] TT Weeks in Treatment: 2 Vital Signs Time Taken: 13:40 Temperature (F): 97.9 Height (in): 62 Pulse (bpm): 59 Weight (lbs): 102 Respiratory Rate (breaths/min): 16 Body Mass Index (BMI): 18.7 Blood Pressure (mmHg): 99/67 Reference Range: 80 - 120 mg / dl Electronic Signature(s) Signed: 08/06/2020 4:44:07 PM By: Amber Abts RN, BSN Entered By: Amber Abts on 08/01/2020 13:40:46
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
## 2020-08-13 ENCOUNTER — Other Ambulatory Visit: Payer: Self-pay

## 2020-08-13 ENCOUNTER — Encounter (HOSPITAL_BASED_OUTPATIENT_CLINIC_OR_DEPARTMENT_OTHER): Payer: Self-pay | Attending: Internal Medicine | Admitting: Internal Medicine

## 2020-08-13 DIAGNOSIS — Z86711 Personal history of pulmonary embolism: Secondary | ICD-10-CM | POA: Insufficient documentation

## 2020-08-13 DIAGNOSIS — L97818 Non-pressure chronic ulcer of other part of right lower leg with other specified severity: Secondary | ICD-10-CM

## 2020-08-13 DIAGNOSIS — I361 Nonrheumatic tricuspid (valve) insufficiency: Secondary | ICD-10-CM | POA: Insufficient documentation

## 2020-08-13 DIAGNOSIS — Z8614 Personal history of Methicillin resistant Staphylococcus aureus infection: Secondary | ICD-10-CM | POA: Insufficient documentation

## 2020-08-13 DIAGNOSIS — L97812 Non-pressure chronic ulcer of other part of right lower leg with fat layer exposed: Secondary | ICD-10-CM | POA: Insufficient documentation

## 2020-08-13 NOTE — Progress Notes (Signed)
Savas, Chandrika L. ([id number]) Visit Report for 08/13/2020 Arrival Information Details Patient Name: Date of Service: Amber FuchsRO BINSO N, Amber L. 08/13/2020 2:15 PM Medical Record Number: [id number] Patient Account Number: [id number] Date of Birth/Sex: Treating RN: 03/11/1990 (30 y.o. Amber StandardF) Boehlein, Linda Primary Care Malynn Lucy: [place] Other Clinician: Referring Olimpia Tinch: Treating Virdie Penning/Extender: Geralyn CorwinHoffman, Jessica CENTER, [facility] TT Weeks in Treatment: 3 Visit Information History Since Last Visit Added or deleted any medications: No Patient Arrived: Ambulatory Any new allergies or adverse reactions: No Arrival Time: 14:01 Had a fall or experienced change in No Accompanied By: self activities of daily living that may affect Transfer Assistance: None risk of falls: Patient Identification Verified: Yes Signs or symptoms of abuse/neglect since last visito No Secondary Verification Process Completed: Yes Hospitalized since last visit: No Patient Requires Transmission-Based Precautions: No Implantable device outside of the clinic excluding No Patient Has Alerts: No cellular tissue based products placed in the center since last visit: Has Dressing in Place as Prescribed: Yes Pain Present Now: Yes Electronic Signature(s) Signed: 08/13/2020 6:11:21 PM By: Zenaida DeedBoehlein, Linda RN, BSN Entered By: Zenaida DeedBoehlein, Linda on 08/13/2020 14:04:41 -------------------------------------------------------------------------------- Clinic Level of Care Assessment Details Patient Name: Date of Service: RO Lorne SkeensBINSO N, Shatarra L. 08/13/2020 2:15 PM Medical Record Number: [id number] Patient Account Number: [id number] Date of Birth/Sex: Treating RN: 05/19/1990 (30 y.o. Amber StandardF) Boehlein, Linda Primary Care Mary-Anne Polizzi: [place] Other Clinician: Referring Rexanna Louthan: Treating Liviana Mills/Extender: Geralyn CorwinHoffman, Jessica CENTER, [facility] Weeks in Treatment: 3 Clinic Level of Care Assessment Items TOOL 4 Quantity Score []  - 0 Use when only an  EandM is performed on FOLLOW-UP visit ASSESSMENTS - Nursing Assessment / Reassessment X- 1 10 Reassessment of Co-morbidities (includes updates in patient status) X- 1 5 Reassessment of Adherence to Treatment Plan ASSESSMENTS - Wound and Skin A ssessment / Reassessment X - Simple Wound Assessment / Reassessment - one wound 1 5 []  - 0 Complex Wound Assessment / Reassessment - multiple wounds []  - 0 Dermatologic / Skin Assessment (not related to wound area) ASSESSMENTS - Focused Assessment []  - 0 Circumferential Edema Measurements - multi extremities []  - 0 Nutritional Assessment / Counseling / Intervention X- 1 5 Lower Extremity Assessment (monofilament, tuning fork, pulses) []  - 0 Peripheral Arterial Disease Assessment (using hand held doppler) ASSESSMENTS - Ostomy and/or Continence Assessment and Care []  - 0 Incontinence Assessment and Management []  - 0 Ostomy Care Assessment and Management (repouching, etc.) PROCESS - Coordination of Care X - Simple Patient / Family Education for ongoing care 1 15 []  - 0 Complex (extensive) Patient / Family Education for ongoing care X- 1 10 Staff obtains ChiropractorConsents, Records, T Results / Process Orders est []  - 0 Staff telephones HHA, Nursing Homes / Clarify orders / etc []  - 0 Routine Transfer to another Facility (non-emergent condition) []  - 0 Routine Hospital Admission (non-emergent condition) []  - 0 New Admissions / Manufacturing engineernsurance Authorizations / Ordering NPWT Apligraf, etc. , []  - 0 Emergency Hospital Admission (emergent condition) X- 1 10 Simple Discharge Coordination []  - 0 Complex (extensive) Discharge Coordination PROCESS - Special Needs []  - 0 Pediatric / Minor Patient Management []  - 0 Isolation Patient Management []  - 0 Hearing / Language / Visual special needs []  - 0 Assessment of Community assistance (transportation, D/C planning, etc.) []  - 0 Additional assistance / Altered mentation []  - 0 Support Surface(s)  Assessment (bed, cushion, seat, etc.) INTERVENTIONS - Wound Cleansing / Measurement X - Simple Wound Cleansing - one wound 1 5 []  - 0  Complex Wound Cleansing - multiple wounds X- 1 5 Wound Imaging (photographs - any number of wounds) []  - 0 Wound Tracing (instead of photographs) X- 1 5 Simple Wound Measurement - one wound []  - 0 Complex Wound Measurement - multiple wounds INTERVENTIONS - Wound Dressings X - Small Wound Dressing one or multiple wounds 1 10 []  - 0 Medium Wound Dressing one or multiple wounds []  - 0 Large Wound Dressing one or multiple wounds X- 1 5 Application of Medications - topical []  - 0 Application of Medications - injection INTERVENTIONS - Miscellaneous []  - 0 External ear exam []  - 0 Specimen Collection (cultures, biopsies, blood, body fluids, etc.) []  - 0 Specimen(s) / Culture(s) sent or taken to Lab for analysis []  - 0 Patient Transfer (multiple staff / / Similar devices) []  - 0 Simple Staple / Suture removal (25 or less) []  - 0 Complex Staple / Suture removal (26 or more) []  - 0 Hypo / Hyperglycemic Management (close monitor of Blood Glucose) []  - 0 Ankle / Brachial Index (ABI) - do not check if billed separately X- 1 5 Vital Signs Has the patient been seen at the hospital within the last three years: Yes Total Score: 95 Level Of Care: New/Established - Level 3 Electronic Signature(s) Signed: 08/13/2020 6:11:21 PM By: RN, BSN Entered By: on 08/13/2020 15:16:13 -------------------------------------------------------------------------------- Encounter Discharge Information Details Patient Name: Date of Service: . 08/13/2020 2:15 PM Medical Record Number: Patient Account Number: Date of Birth/Sex: Treating RN: 1990-03-29 (30 y.o. Primary Care Ambika Zettlemoyer: [place],  Other Clinician: Referring Doshie Maggi: Treating Mischele Detter/Extender: 10/13/2020 C[facility], [facility] Weeks in Treatment: 3 Encounter Discharge Information Items Discharge Condition: Stable Ambulatory Status: Ambulatory Discharge Destination: Home Transportation: Private Auto Accompanied By: self Schedule Follow-up Appointment: Yes Clinical Summary of Care: Patient Declined Electronic Signature(s) Signed: 08/13/2020 6:11:21 PM By: Zenaida Deed RN, BSN Entered By: 10/13/2020 on 08/13/2020 15:21:02 -------------------------------------------------------------------------------- Lower Extremity Assessment Details Patient Name: Date of Service: 10/13/2020. 08/13/2020 2:15 PM Medical Record Number: [id number] Patient Account Number: 03/17/1990 Date of Birth/Sex: Treating RN: November 01, 1990 (30 y.o. [place] Primary Care [place] Behlke: [place], [place] Geralyn Corwin Other Clinician: Referring Charnetta Wulff: Treating Dagny Fiorentino/Extender: 10/13/2020 CENTER, [facility] Weeks in Treatment: 3 Edema Assessment Assessed: [Left: No] [Right: No] Edema: [Left: N] [Right: o] Calf Left: Right: Point of Measurement: 26 cm From Medial Instep 30 cm Ankle Left: Right: Point of Measurement: 7 cm From Medial Instep 19 cm Vascular Assessment Pulses: Dorsalis Pedis Palpable: [Right:Yes] Electronic Signature(s) Signed: 08/13/2020 6:11:21 PM By: Zenaida Deed RN, BSN Entered By: 10/13/2020 on 08/13/2020 14:06:08 -------------------------------------------------------------------------------- Multi Wound Chart Details Patient Name: Date of Service: 10/13/2020. 08/13/2020 2:15 PM Medical Record Number: [id number] Patient Account Number: 03/17/1990 Date of Birth/Sex: Treating RN: Jan 01, 1991 (30 y.o. [place] Primary Care Hubbert Landrigan: [place], [place] Geralyn Corwin Other Clinician: Referring Rileyann Florance: Treating Shirly Bartosiewicz/Extender: 10/13/2020 CENTER, [facility] TT Weeks in Treatment: 3 Vital Signs Height(in): 62 Pulse(bpm): 68 Weight(lbs): 102 Blood Pressure(mmHg): 120/67 Body  Mass Index(BMI): 19 Temperature(F): 98.2 Respiratory Rate(breaths/min): 18 Photos: [N/A:N/A] Right, Anterior Lower Leg N/A N/A Wound Location: Gradually Appeared N/A N/A Wounding Event: Abscess N/A N/A Primary Etiology: Hepatitis C, Neuropathy N/A N/A Comorbid History: 06/21/2020 N/A N/A Date Acquired: 3 N/A N/A Weeks of Treatment: Open N/A N/A Wound Status: 0.6x0.3x0.7 N/A N/A Measurements L x W x D (cm) 0.141 N/A N/A A (  cm) : rea 0.099 N/A N/A Volume (cm) : -19.50% N/A N/A % Reduction in A rea: -312.50% N/A N/A % Reduction in Volume: 4 Position 1 (o'clock): 1.4 Maximum Distance 1 (cm): Yes N/A N/A Tunneling: Full Thickness Without Exposed N/A N/A Classification: Support Structures Medium N/A N/A Exudate Amount: Serosanguineous N/A N/A Exudate Type: red, brown N/A N/A Exudate Color: Well defined, not attached N/A N/A Wound Margin: Large (67-100%) N/A N/A Granulation Amount: Red N/A N/A Granulation Quality: None Present (0%) N/A N/A Necrotic Amount: Fat Layer (Subcutaneous Tissue): Yes N/A N/A Exposed Structures: Fascia: No Tendon: No Muscle: No Joint: No Bone: No Small (1-33%) N/A N/A Epithelialization: Treatment Notes Wound #1 (Lower Leg) Wound Laterality: Right, Anterior Cleanser Soap and Water Discharge Instruction: May shower and wash wound with dial antibacterial soap and water prior to dressing change. Peri-Wound Care Skin Prep Discharge Instruction: Use skin prep as directed Topical Mupirocin Ointment Discharge Instruction: Apply Mupirocin (Bactroban) to wound bed, under alginate Primary Dressing KerraCel Ag Gelling Fiber Dressing, 2x2 in (silver alginate) Discharge Instruction: Apply silver alginate to wound bed as instructed Secondary Dressing Bordered Gauze, 2x2 in Discharge Instruction: Apply over primary dressing as directed. Secured With Compression Wrap Compression Stockings Facilities manager) Signed:  08/13/2020 3:52:36 PM By: Geralyn Corwin DO Signed: 08/13/2020 5:51:46 PM By: Zandra Abts RN, BSN Entered By: Geralyn Corwin on 08/13/2020 15:48:16 -------------------------------------------------------------------------------- Multi-Disciplinary Care Plan Details Patient Name: Date of Service: Amber [name]. 08/13/2020 2:15 PM Medical Record Number: [id number] Patient Account Number: [id number] Date of Birth/Sex: Treating RN: [birth year] (30 y.o. Amber [name] Primary Care Willard Madrigal: [place], [place] Other Clinician: Referring Elisabel Hanover: Treating Nihar Klus/Extender: Geralyn Corwin CENTER, [facility] Weeks in Treatment: 3 Multidisciplinary Care Plan reviewed with physician Active Inactive Wound/Skin Impairment Nursing Diagnoses: Impaired tissue integrity Knowledge deficit related to ulceration/compromised skin integrity Goals: Patient/caregiver will verbalize understanding of skin care regimen Date Initiated: 07/18/2020 Target Resolution Date: 08/17/2020 Goal Status: Active Ulcer/skin breakdown will have a volume reduction of 30% by week 4 Date Initiated: 07/18/2020 Target Resolution Date: 08/17/2020 Goal Status: Active Interventions: Assess patient/caregiver ability to obtain necessary supplies Assess patient/caregiver ability to perform ulcer/skin care regimen upon admission and as needed Assess ulceration(s) every visit Provide education on ulcer and skin care Notes: Electronic Signature(s) Signed: 08/13/2020 6:11:21 PM By: Zenaida Deed RN, BSN Entered By: Zenaida Deed on 08/13/2020 14:14:54 -------------------------------------------------------------------------------- Pain Assessment Details Patient Name: Date of Service: Amber [name], Amber L. 08/13/2020 2:15 PM Medical Record Number: [id number] Patient Account Number: [id number] Date of Birth/Sex: Treating RN: 1990-02-19 (30 y.o. Amber [name] Primary Care Errick Salts: [place], [place] TS Other Clinician: Referring  Mizael Sagar: Treating Jailynne Opperman/Extender: Geralyn Corwin CENTER, [facility] TT Weeks in Treatment: 3 Active Problems Location of Pain Severity and Description of Pain Patient Has Paino Yes Site Locations Pain Location: Pain in Ulcers With Dressing Change: Yes Duration of the Pain. Constant / Intermittento Intermittent Rate the pain. Current Pain Level: 4 Least Pain Level: 0 Character of Pain Describe the Pain: Other: sore Pain Management and Medication Current Pain Management: Other: time Is the Current Pain Management Adequate: Adequate How does your wound impact your activities of daily livingo Sleep: No Bathing: No Appetite: No Relationship With Others: No Bladder Continence: No Emotions: No Bowel Continence: No Work: No Toileting: No Drive: No Dressing: No Hobbies: No Electronic Signature(s) Signed: 08/13/2020 6:11:21 PM By: Zenaida Deed RN, BSN Entered By: Zenaida Deed on 08/13/2020 14:05:48 -------------------------------------------------------------------------------- Patient/Caregiver Education Details Patient Name: Date of Service: RO  JURNEI, LATINI 8/1/2022andnbsp2:15 PM Medical Record Number: [id number] Patient Account Number: [id number] Date of Birth/Gender: Treating RN: 05-Sep-1990 (30 y.o. Amber [name] Primary Care Physician: [place], [place] Other Clinician: Referring Physician: Treating Physician/Extender: Geralyn Corwin CENTER, [facility] TT Weeks in Treatment: 3 Education Assessment Education Provided To: Patient Education Topics Provided Wound/Skin Impairment: Methods: Explain/Verbal Responses: Reinforcements needed, State content correctly [employer]) Signed: 08/13/2020 6:11:21 PM By: Zenaida Deed RN, BSN Entered By: Zenaida Deed on 08/13/2020 14:15:07 -------------------------------------------------------------------------------- Wound Assessment Details Patient Name: Date of Service: Amber [name]. 08/13/2020 2:15  PM Medical Record Number: [id number] Patient Account Number: [id number] Date of Birth/Sex: Treating RN: 11-06-1990 (30 y.o. Amber [name] Primary Care Jasmen Emrich: [place], [place] TI Other Clinician: Referring Javaya [place]: Treating Damiya Sandefur/Extender: Geralyn Corwin C[facility], [facility] Weeks in Treatment: 3 Wound Status Wound Number: 1 Primary Etiology: Abscess Wound Location: Right, Anterior Lower Leg Wound Status: Open Wounding Event: Gradually Appeared Comorbid History: Hepatitis C, Neuropathy Date Acquired: 06/21/2020 Weeks Of Treatment: 3 Clustered Wound: No Photos Wound Measurements Length: (cm) 0.6 Width: (cm) 0.3 Depth: (cm) 0.7 Area: (cm) 0.141 Volume: (cm) 0.099 % Reduction in Area: -19.5% % Reduction in Volume: -312.5% Epithelialization: Small (1-33%) Tunneling: Yes Position (o'clock): 4 Maximum Distance: (cm) 1.4 Undermining: No Wound Description Classification: Full Thickness Without Exposed Support Structu Wound Margin: Well defined, not attached Exudate Amount: Medium Exudate Type: Serosanguineous Exudate Color: red, brown res Foul Odor After Cleansing: No Slough/Fibrino Yes Wound Bed Granulation Amount: Large (67-100%) Exposed Structure Granulation Quality: Red Fascia Exposed: No Necrotic Amount: None Present (0%) Fat Layer (Subcutaneous Tissue) Exposed: Yes Tendon Exposed: No Muscle Exposed: No Joint Exposed: No Bone Exposed: No Treatment Notes Wound #1 (Lower Leg) Wound Laterality: Right, Anterior Cleanser Soap and Water Discharge Instruction: May shower and wash wound with dial antibacterial soap and water prior to dressing change. Peri-Wound Care Skin Prep Discharge Instruction: Use skin prep as directed Topical Mupirocin Ointment Discharge Instruction: Apply Mupirocin (Bactroban) to wound bed, under alginate Primary Dressing KerraCel Ag Gelling Fiber Dressing, 2x2 in (silver alginate) Discharge Instruction: Apply silver alginate to wound bed  as instructed Secondary Dressing Bordered Gauze, 2x2 in Discharge Instruction: Apply over primary dressing as directed. Secured With Compression Wrap Compression Stockings Facilities manager) Signed: 08/13/2020 5:51:46 PM By: Zandra Abts RN, BSN Signed: 08/13/2020 6:11:21 PM By: Zenaida Deed RN, BSN Entered By: Zandra Abts on 08/13/2020 14:12:32 -------------------------------------------------------------------------------- Vitals Details Patient Name: Date of Service: RO Lorne Skeens. 08/13/2020 2:15 PM Medical Record Number: [id number] Patient Account Number: [id number] Date of Birth/Sex: Treating RN: June 22, 1990 (30 y.o. Amber [name] Primary Care Dyneshia Baccam: [place], [place] QQ Other Clinician: Referring Marcelino Campos: Treating Malli Falotico/Extender: Geralyn Corwin CENTER, [facility] Weeks in Treatment: 3 Vital Signs Time Taken: 14:04 Temperature (F): 98.2 Height (in): 62 Pulse (bpm): 68 Source: Stated Respiratory Rate (breaths/min): 18 Weight (lbs): 102 Blood Pressure (mmHg): 120/67 Source: Stated Reference Range: 80 - 120 mg / dl Body Mass Index (BMI): 18.7 Electronic Signature(s) Signed: 08/13/2020 6:11:21 PM By: Zenaida Deed RN, BSN Entered By: Zenaida Deed on 08/13/2020 14:05:05
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
## 2020-08-17 ENCOUNTER — Telehealth (INDEPENDENT_AMBULATORY_CARE_PROVIDER_SITE_OTHER): Payer: Self-pay | Admitting: Thoracic Surgery (Cardiothoracic Vascular Surgery)

## 2020-08-17 ENCOUNTER — Other Ambulatory Visit: Payer: Self-pay

## 2020-08-17 DIAGNOSIS — I071 Rheumatic tricuspid insufficiency: Secondary | ICD-10-CM

## 2020-08-17 NOTE — Progress Notes (Signed)
     [address]       [name] [address]             [phone]       Patient: Home Provider: Office Consent for Telemedicine visit obtained.  Today's visit was completed via a real-time telehealth (see specific modality noted below). The patient/authorized person provided oral consent at the time of the visit to engage in a telemedicine encounter with the present provider at [facility]. The patient/authorized person was informed of the potential benefits, limitations, and risks of telemedicine. The patient/authorized person expressed understanding that the laws that protect confidentiality also apply to telemedicine. The patient/authorized person acknowledged understanding that telemedicine does not provide emergency services and that he or she would need to call 911 or proceed to the nearest hospital for help if such a need arose.   Total time spent in the clinical discussion 10 minutes.  Telehealth Modality: Phone visit (audio only)  I had a telephone visit with [name].  She is a young lady with severe tricuspid valve regurgitation due to endocarditis from IV drug abuse.  She underwent angio VAC debridement and November 2021.  Overall doing well.  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  She is on more doxycyline for her leg wound, and has grown out MRSA.  There is a question of whether or not she will need IV antibiotics for treatment of this.    Given this indolent source of infection, I explained to her that valve repair basement should be delayed until this is completely addressed.  She is not symptomatic from a cardiopulmonary standpoint, and her LFTs were relatively normal on last check.  She will continue to follow-up with cardiology, and will touch base with [name] if she starts becoming symptomatic.

## 2020-08-29 ENCOUNTER — Other Ambulatory Visit: Payer: Self-pay

## 2020-08-29 ENCOUNTER — Encounter (HOSPITAL_BASED_OUTPATIENT_CLINIC_OR_DEPARTMENT_OTHER): Payer: Self-pay | Admitting: Internal Medicine
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
## 2020-09-03 NOTE — Progress Notes (Signed)
Clos, Ellamae L. ([id number]) Visit Report for 08/29/2020 Arrival Information Details Patient Name: Date of Service: Amber [name], Amber [name] 08/29/2020 3:00 PM Medical Record Number: [id number] Patient Account Number: [id number] Date of Birth/Sex: Treating RN: 09/01/1990 (30 y.o. Nancy Fetter Primary Care Haven Pylant: [place], [place] Other Clinician: Donavan Burnet Referring Sherronda Sweigert: Treating Kei Langhorst/Extender: Linton Ham [facility], [facility] [place] Weeks in Treatment: 6 Visit Information History Since Last Visit All ordered tests and consults were completed: Yes Patient Arrived: Ambulatory Added or deleted any medications: No Arrival Time: 15:53 Any new allergies or adverse reactions: No Accompanied By: mother Had a fall or experienced change in No Transfer Assistance: None activities of daily living that may affect Patient Identification Verified: Yes risk of falls: Secondary Verification Process Completed: Yes Signs or symptoms of abuse/neglect since last visito No Patient Requires Transmission-Based Precautions: No Hospitalized since last visit: No Patient Has Alerts: No Implantable device outside of the clinic excluding No cellular tissue based products placed in the center since last visit: Pain Present Now: No Electronic Signature(s) Signed: 09/03/2020 9:53:06 AM By: Donavan Burnet EMT Entered By: Donavan Burnet on 08/29/2020 16:02:17 -------------------------------------------------------------------------------- Clinic Level of Care Assessment Details Patient Name: Date of Service: BETHA, SHADIX 08/29/2020 3:00 PM Medical Record Number: [id number] Patient Account Number: [id number] Date of Birth/Sex: Treating RN: [birth year] (30 y.o. Nancy Fetter Primary Care Yailine Ballard: [place], [place] TT Other Clinician: Donavan Burnet Referring Aleeya Veitch: Treating Ondine Gemme/Extender: Johnn Hai, [facility] [place] Weeks in Treatment: 6 Clinic Level of Care Assessment  Items TOOL 4 Quantity Score X- 1 0 Use when only an EandM is performed on FOLLOW-UP visit ASSESSMENTS - Nursing Assessment / Reassessment X- 1 10 Reassessment of Co-morbidities (includes updates in patient status) X- 1 5 Reassessment of Adherence to Treatment Plan ASSESSMENTS - Wound and Skin A ssessment / Reassessment X - Simple Wound Assessment / Reassessment - one wound 1 5 '[]'  - 0 Complex Wound Assessment / Reassessment - multiple wounds '[]'  - 0 Dermatologic / Skin Assessment (not related to wound area) ASSESSMENTS - Focused Assessment '[]'  - 0 Circumferential Edema Measurements - multi extremities '[]'  - 0 Nutritional Assessment / Counseling / Intervention '[]'  - 0 Lower Extremity Assessment (monofilament, tuning fork, pulses) '[]'  - 0 Peripheral Arterial Disease Assessment (using hand held doppler) ASSESSMENTS - Ostomy and/or Continence Assessment and Care '[]'  - 0 Incontinence Assessment and Management '[]'  - 0 Ostomy Care Assessment and Management (repouching, etc.) PROCESS - Coordination of Care X - Simple Patient / Family Education for ongoing care 1 15 '[]'  - 0 Complex (extensive) Patient / Family Education for ongoing care X- 1 10 Staff obtains Programmer, systems, Records, T Results / Process Orders est '[]'  - 0 Staff telephones HHA, Nursing Homes / Clarify orders / etc '[]'  - 0 Routine Transfer to another Facility (non-emergent condition) '[]'  - 0 Routine Hospital Admission (non-emergent condition) '[]'  - 0 New Admissions / Biomedical engineer / Ordering NPWT Apligraf, etc. , '[]'  - 0 Emergency Hospital Admission (emergent condition) X- 1 10 Simple Discharge Coordination '[]'  - 0 Complex (extensive) Discharge Coordination PROCESS - Special Needs '[]'  - 0 Pediatric / Minor Patient Management '[]'  - 0 Isolation Patient Management '[]'  - 0 Hearing / Language / Visual special needs '[]'  - 0 Assessment of Community assistance (transportation, D/C planning, etc.) '[]'  - 0 Additional  assistance / Altered mentation '[]'  - 0 Support Surface(s) Assessment (bed, cushion, seat, etc.) INTERVENTIONS - Wound Cleansing / Measurement X - Simple Wound Cleansing - one wound 1  5 '[]'  - 0 Complex Wound Cleansing - multiple wounds X- 1 5 Wound Imaging (photographs - any number of wounds) '[]'  - 0 Wound Tracing (instead of photographs) X- 1 5 Simple Wound Measurement - one wound '[]'  - 0 Complex Wound Measurement - multiple wounds INTERVENTIONS - Wound Dressings X - Small Wound Dressing one or multiple wounds 1 10 '[]'  - 0 Medium Wound Dressing one or multiple wounds '[]'  - 0 Large Wound Dressing one or multiple wounds '[]'  - 0 Application of Medications - topical '[]'  - 0 Application of Medications - injection INTERVENTIONS - Miscellaneous '[]'  - 0 External ear exam '[]'  - 0 Specimen Collection (cultures, biopsies, blood, body fluids, etc.) '[]'  - 0 Specimen(s) / Culture(s) sent or taken to Lab for analysis '[]'  - 0 Patient Transfer (multiple staff / Civil Service fast streamer / Similar devices) '[]'  - 0 Simple Staple / Suture removal (25 or less) '[]'  - 0 Complex Staple / Suture removal (26 or more) '[]'  - 0 Hypo / Hyperglycemic Management (close monitor of Blood Glucose) '[]'  - 0 Ankle / Brachial Index (ABI) - do not check if billed separately X- 1 5 Vital Signs Has the patient been seen at the hospital within the last three years: Yes Total Score: 85 Level Of Care: New/Established - Level 3 Electronic Signature(s) Signed: 08/29/2020 6:11:36 PM By: Levan Hurst RN, BSN Entered By: Levan Hurst on 08/29/2020 18:01:47 -------------------------------------------------------------------------------- Encounter Discharge Information Details Patient Name: Date of Service: Amber [name], Amber L. 08/29/2020 3:00 PM Medical Record Number: [id number] Patient Account Number: [id number] Date of Birth/Sex: Treating RN: 1990-09-15 (30 y.o. Nancy Fetter Primary Care Edric Fetterman: [place], [place] TT Other Clinician:  Donavan Burnet Referring Keanu Frickey: Treating Sharlena Kristensen/Extender: Johnn Hai, [place] Weeks in Treatment: 6 Encounter Discharge Information Items Discharge Condition: Stable Ambulatory Status: Ambulatory Discharge Destination: Home Transportation: Private Auto Accompanied By: alone Schedule Follow-up Appointment: Yes Clinical Summary of Care: Patient Declined Electronic Signature(s) Signed: 08/29/2020 6:11:36 PM By: Levan Hurst RN, BSN Entered By: Levan Hurst on 08/29/2020 18:02:19 -------------------------------------------------------------------------------- Lower Extremity Assessment Details Patient Name: Date of Service: Amber [name], Amber L. 08/29/2020 3:00 PM Medical Record Number: [id number] Patient Account Number: [id number] Date of Birth/Sex: Treating RN: [birth year] (30 y.o. Nancy Fetter Primary Care Dilon Lank: [place], [place] TT Other Clinician: Donavan Burnet Referring Jaryan Chicoine: Treating Vermell Madrid/Extender: Johnn Hai, [facility] Weeks in Treatment: 6 Edema Assessment Assessed: [Left: Yes] [Right: No] Edema: [Left: No] [Right: No] Calf Left: Right: Point of Measurement: 26 cm From Medial Instep 32.1 cm 30 cm Ankle Left: Right: Point of Measurement: 7 cm From Medial Instep 22.4 cm 19 cm Vascular Assessment Pulses: Dorsalis Pedis Palpable: [Left:Yes] Electronic Signature(s) Signed: 08/29/2020 6:11:36 PM By: Levan Hurst RN, BSN Signed: 09/03/2020 9:53:06 AM By: Donavan Burnet EMT Entered By: Donavan Burnet on 08/29/2020 16:06:43 -------------------------------------------------------------------------------- Multi Wound Chart Details Patient Name: Date of Service: Amber [name], Amber L. 08/29/2020 3:00 PM Medical Record Number: [id number] Patient Account Number: [id number] Date of Birth/Sex: Treating RN: 09-18-1990 (30 y.o. Nancy Fetter Primary Care Beckett Hickmon: [place], [place] TT Other Clinician: Donavan Burnet Referring  Rashida Ladouceur: Treating Salihah Peckham/Extender: Johnn Hai, [facility] Weeks in Treatment: 6 Vital Signs Height(in): 62 Pulse(bpm): 64 Weight(lbs): 102 Blood Pressure(mmHg): 108/73 Body Mass Index(BMI): 19 Temperature(F): 98.3 Respiratory Rate(breaths/min): 18 Photos: [1:No Photos Right, Anterior Lower Leg] [N/A:N/A N/A] Wound Location: [1:Gradually Appeared] [N/A:N/A] Wounding Event: [1:Abscess] [N/A:N/A] Primary Etiology: [1:Hepatitis C, Neuropathy] [N/A:N/A] Comorbid History: [1:06/21/2020] [N/A:N/A] Date Acquired: [1:6] [N/A:N/A] Weeks of Treatment: [1:Open] [  N/A:N/A] Wound Status: [1:0.2x0.2x0.2] [N/A:N/A] Measurements L x W x D (cm) [1:0.031] [N/A:N/A] A (cm) : rea [1:0.006] [N/A:N/A] Volume (cm) : [1:73.70%] [N/A:N/A] % Reduction in A rea: [1:75.00%] [N/A:N/A] % Reduction in Volume: [1:Full Thickness Without Exposed] [N/A:N/A] Classification: [1:Support Structures Small] [N/A:N/A] Exudate Amount: [1:Serous] [N/A:N/A] Exudate Type: [1:amber] [N/A:N/A] Exudate Color: [1:Well defined, not attached] [N/A:N/A] Wound Margin: [1:Large (67-100%)] [N/A:N/A] Granulation Amount: [1:Pink] [N/A:N/A] Granulation Quality: [1:None Present (0%)] [N/A:N/A] Necrotic Amount: [1:Fat Layer (Subcutaneous Tissue): Yes N/A] Exposed Structures: [1:Fascia: No Tendon: No Muscle: No Joint: No Bone: No Large (67-100%)] [N/A:N/A] Treatment Notes Electronic Signature(s) Signed: 08/29/2020 5:36:21 PM By: Linton Ham MD Signed: 08/29/2020 6:11:36 PM By: Levan Hurst RN, BSN Entered By: Linton Ham on 08/29/2020 17:03:46 -------------------------------------------------------------------------------- Multi-Disciplinary Care Plan Details Patient Name: Date of Service: Amber [name], Amber L. 08/29/2020 3:00 PM Medical Record Number: [id number] Patient Account Number: [id number] Date of Birth/Sex: Treating RN: [birth year] (30 y.o. Nancy Fetter Primary Care Nnenna Meador: [place], [place] TT Other  Clinician: Donavan Burnet Referring Ervin Hensley: Treating Yanci Bachtell/Extender: Johnn Hai, [facility] Weeks in Treatment: [place] reviewed with physician Active Inactive Wound/Skin Impairment Nursing Diagnoses: Impaired tissue integrity Knowledge deficit related to ulceration/compromised skin integrity Goals: Patient/caregiver will verbalize understanding of skin care regimen Date Initiated: 07/18/2020 Target Resolution Date: 09/28/2020 Goal Status: Active Ulcer/skin breakdown will have a volume reduction of 30% by week 4 Date Initiated: 07/18/2020 Date Inactivated: 08/29/2020 Target Resolution Date: 08/17/2020 Goal Status: Met Interventions: Assess patient/caregiver ability to obtain necessary supplies Assess patient/caregiver ability to perform ulcer/skin care regimen upon admission and as needed Assess ulceration(s) every visit Provide education on ulcer and skin care Notes: Electronic Signature(s) Signed: 08/29/2020 6:11:36 PM By: Levan Hurst RN, BSN Entered By: Levan Hurst on 08/29/2020 16:43:16 -------------------------------------------------------------------------------- Pain Assessment Details Patient Name: Date of Service: Amber [name], Amber L. 08/29/2020 3:00 PM Medical Record Number: [id number] Patient Account Number: [id number] Date of Birth/Sex: Treating RN: 08/17/1990 (30 y.o. Nancy Fetter Primary Care Numan Zylstra: [place], [place] TT Other Clinician: Donavan Burnet Referring Jakyri Brunkhorst: Treating Nadene Witherspoon/Extender: Johnn Hai, [place] TT Weeks in Treatment: 6 Active Problems Location of Pain Severity and Description of Pain Patient Has Paino No Site Locations Pain Management and Medication Current Pain Management: Electronic Signature(s) Signed: 08/29/2020 6:11:36 PM By: Levan Hurst RN, BSN Signed: 09/03/2020 9:53:06 AM By: Donavan Burnet EMT Entered By: Donavan Burnet on 08/29/2020  16:02:34 -------------------------------------------------------------------------------- Patient/Caregiver Education Details Patient Name: Date of Service: Greggory Stallion 8/17/2022andnbsp3:00 PM Medical Record Number: [id number] Patient Account Number: [id number] Date of Birth/Gender: Treating RN: 1990/11/11 (30 y.o. Nancy Fetter Primary Care Physician: [place], [place] TT Other Clinician: Donavan Burnet Referring Physician: Treating Physician/Extender: Johnn Hai, [place] TT Weeks in Treatment: 6 Education Assessment Education Provided To: Patient Education Topics Provided Wound/Skin Impairment: Methods: Explain/Verbal Responses: State content correctly [employer]) Signed: 08/29/2020 6:11:36 PM By: Levan Hurst RN, BSN Entered By: Levan Hurst on 08/29/2020 18:01:18 -------------------------------------------------------------------------------- Wound Assessment Details Patient Name: Date of Service: Amber [name], Amber L. 08/29/2020 3:00 PM Medical Record Number: [id number] Patient Account Number: [id number] Date of Birth/Sex: Treating RN: 12/13/1990 (30 y.o. Nancy Fetter Primary Care Adrionna Delcid: [place], [place] TT Other Clinician: Donavan Burnet Referring Ajaya Crutchfield: Treating Liela Rylee/Extender: Johnn Hai, [facility] Weeks in Treatment: 6 Wound Status Wound Number: 1 Primary Etiology: Abscess Wound Location: Right, Anterior Lower Leg Wound Status: Open Wounding Event: Gradually Appeared Comorbid History: Hepatitis C, Neuropathy Date Acquired: 06/21/2020 Weeks Of Treatment: 6 Clustered  Wound: No Wound Measurements Length: (cm) 0.2 Width: (cm) 0.2 Depth: (cm) 0.2 Area: (cm) 0.031 Volume: (cm) 0.006 % Reduction in Area: 73.7% % Reduction in Volume: 75% Epithelialization: Large (67-100%) Tunneling: No Undermining: No Wound Description Classification: Full Thickness Without Exposed Support Structures Wound Margin: Well  defined, not attached Exudate Amount: Small Exudate Type: Serous Exudate Color: amber Foul Odor After Cleansing: No Slough/Fibrino Yes Wound Bed Granulation Amount: Large (67-100%) Exposed Structure Granulation Quality: Pink Fascia Exposed: No Necrotic Amount: None Present (0%) Fat Layer (Subcutaneous Tissue) Exposed: Yes Tendon Exposed: No Muscle Exposed: No Joint Exposed: No Bone Exposed: No Treatment Notes Wound #1 (Lower Leg) Wound Laterality: Right, Anterior Cleanser Soap and Water Discharge Instruction: May shower and wash wound with dial antibacterial soap and water prior to dressing change. Peri-Wound Care Skin Prep Discharge Instruction: Use skin prep as directed Topical Mupirocin Ointment Discharge Instruction: Apply Mupirocin (Bactroban) to wound bed, under alginate Primary Dressing KerraCel Ag Gelling Fiber Dressing, 2x2 in (silver alginate) Discharge Instruction: Apply silver alginate to wound bed as instructed Secondary Dressing Bordered Gauze, 2x2 in Discharge Instruction: Apply over primary dressing as directed. Secured With Compression Wrap Compression Stockings Environmental education officer) Signed: 08/29/2020 6:11:36 PM By: Levan Hurst RN, BSN Entered By: Levan Hurst on 08/29/2020 16:42:44 -------------------------------------------------------------------------------- [place] Details Patient Name: Date of Service: Amber [name], Amber L. 08/29/2020 3:00 PM Medical Record Number: [id number] Patient Account Number: [id number] Date of Birth/Sex: Treating RN: 1990-09-16 (30 y.o. Nancy Fetter Primary Care Matteo Banke: [place], [place] TT Other Clinician: Donavan Burnet Referring Devaun Hernandez: Treating Kellen Hover/Extender: Johnn Hai, [facility] TT Weeks in Treatment: 6 Vital Signs Time Taken: 15:53 Temperature (F): 98.3 Height (in): 62 Pulse (bpm): 64 Weight (lbs): 102 Respiratory Rate (breaths/min): 18 Body Mass Index (BMI): 18.7 Blood  Pressure (mmHg): 108/73 Reference Range: 80 - 120 mg / dl Electronic Signature(s) Signed: 09/03/2020 9:53:06 AM By: Donavan Burnet EMT Entered By: Donavan Burnet on 08/29/2020 16:02:27

## 2020-09-12 ENCOUNTER — Other Ambulatory Visit: Payer: Self-pay

## 2020-09-12 ENCOUNTER — Encounter (HOSPITAL_BASED_OUTPATIENT_CLINIC_OR_DEPARTMENT_OTHER): Payer: Medicaid Other | Admitting: Internal Medicine

## 2020-09-13 ENCOUNTER — Other Ambulatory Visit (HOSPITAL_COMMUNITY): Payer: Self-pay
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
## 2020-09-13 NOTE — Progress Notes (Signed)
Derego, Nguyen L. ([id number]) Visit Report for 09/12/2020 Arrival Information Details Patient Name: Date of Service: Amber, [name] 09/12/2020 2:45 PM Medical Record Number: [id number] Patient Account Number: [id number] Date of Birth/Sex: Treating RN: Aug 12, 1990 (30 y.o. Nancy Fetter Primary Care Shaena Parkerson: [place], [place] TT Other Clinician: Referring Junice Fei: Treating Nabeeha Badertscher/Extender: Johnn Hai, SCO TT Weeks in Treatment: 8 Visit Information History Since Last Visit Added or deleted any medications: No Patient Arrived: Ambulatory Any new allergies or adverse reactions: No Arrival Time: 14:57 Had a fall or experienced change in No Accompanied By: self activities of daily living that may affect Transfer Assistance: None risk of falls: Patient Identification Verified: Yes Signs or symptoms of abuse/neglect since last visito No Secondary Verification Process Completed: Yes Hospitalized since last visit: No Patient Requires Transmission-Based Precautions: No Implantable device outside of the clinic excluding No Patient Has Alerts: No cellular tissue based products placed in the center since last visit: Has Dressing in Place as Prescribed: Yes Pain Present Now: No Electronic Signature(s) Signed: 09/12/2020 3:05:52 PM By: Sandre Kitty Entered By: Sandre Kitty on 09/12/2020 14:58:15 -------------------------------------------------------------------------------- Clinic Level of Care Assessment Details Patient Name: Date of Service: Amber [name] 09/12/2020 2:45 PM Medical Record Number: [id number] Patient Account Number: [id number] Date of Birth/Sex: Treating RN: [birth year] (30 y.o. Benjaman Lobe Primary Care Abdias Hickam: [place], [place] TT Other Clinician: Referring Naithen Rivenburg: Treating Hobson Lax/Extender: Johnn Hai, SCO TT Weeks in Treatment: 8 Clinic Level of Care Assessment Items TOOL 4 Quantity Score X- 1 0 Use when only an  EandM is performed on FOLLOW-UP visit ASSESSMENTS - Nursing Assessment / Reassessment X- 1 10 Reassessment of Co-morbidities (includes updates in patient status) X- 1 5 Reassessment of Adherence to Treatment Plan ASSESSMENTS - Wound and Skin A ssessment / Reassessment X - Simple Wound Assessment / Reassessment - one wound 1 5 '[]'  - 0 Complex Wound Assessment / Reassessment - multiple wounds X- 1 10 Dermatologic / Skin Assessment (not related to wound area) ASSESSMENTS - Focused Assessment '[]'  - 0 Circumferential Edema Measurements - multi extremities '[]'  - 0 Nutritional Assessment / Counseling / Intervention '[]'  - 0 Lower Extremity Assessment (monofilament, tuning fork, pulses) '[]'  - 0 Peripheral Arterial Disease Assessment (using hand held doppler) ASSESSMENTS - Ostomy and/or Continence Assessment and Care '[]'  - 0 Incontinence Assessment and Management '[]'  - 0 Ostomy Care Assessment and Management (repouching, etc.) PROCESS - Coordination of Care X - Simple Patient / Family Education for ongoing care 1 15 '[]'  - 0 Complex (extensive) Patient / Family Education for ongoing care X- 1 10 Staff obtains Programmer, systems, Records, T Results / Process Orders est '[]'  - 0 Staff telephones HHA, Nursing Homes / Clarify orders / etc '[]'  - 0 Routine Transfer to another Facility (non-emergent condition) '[]'  - 0 Routine Hospital Admission (non-emergent condition) '[]'  - 0 New Admissions / Biomedical engineer / Ordering NPWT Apligraf, etc. , '[]'  - 0 Emergency Hospital Admission (emergent condition) X- 1 10 Simple Discharge Coordination '[]'  - 0 Complex (extensive) Discharge Coordination PROCESS - Special Needs '[]'  - 0 Pediatric / Minor Patient Management '[]'  - 0 Isolation Patient Management '[]'  - 0 Hearing / Language / Visual special needs '[]'  - 0 Assessment of Community assistance (transportation, D/C planning, etc.) '[]'  - 0 Additional assistance / Altered mentation '[]'  - 0 Support Surface(s)  Assessment (bed, cushion, seat, etc.) INTERVENTIONS - Wound Cleansing / Measurement X - Simple Wound Cleansing - one wound 1 5 '[]'  - 0 Complex Wound  Cleansing - multiple wounds X- 1 5 Wound Imaging (photographs - any number of wounds) '[]'  - 0 Wound Tracing (instead of photographs) X- 1 5 Simple Wound Measurement - one wound '[]'  - 0 Complex Wound Measurement - multiple wounds INTERVENTIONS - Wound Dressings X - Small Wound Dressing one or multiple wounds 1 10 '[]'  - 0 Medium Wound Dressing one or multiple wounds '[]'  - 0 Large Wound Dressing one or multiple wounds '[]'  - 0 Application of Medications - topical '[]'  - 0 Application of Medications - injection INTERVENTIONS - Miscellaneous '[]'  - 0 External ear exam '[]'  - 0 Specimen Collection (cultures, biopsies, blood, body fluids, etc.) '[]'  - 0 Specimen(s) / Culture(s) sent or taken to Lab for analysis '[]'  - 0 Patient Transfer (multiple staff / Civil Service fast streamer / Similar devices) '[]'  - 0 Simple Staple / Suture removal (25 or less) '[]'  - 0 Complex Staple / Suture removal (26 or more) '[]'  - 0 Hypo / Hyperglycemic Management (close monitor of Blood Glucose) '[]'  - 0 Ankle / Brachial Index (ABI) - do not check if billed separately X- 1 5 Vital Signs Has the patient been seen at the hospital within the last three years: Yes Total Score: 95 Level Of Care: New/Established - Level 3 Electronic Signature(s) Signed: 09/13/2020 4:23:50 PM By: Rhae Hammock RN Entered By: Rhae Hammock on 09/12/2020 16:02:33 -------------------------------------------------------------------------------- Encounter Discharge Information Details Patient Name: Date of Service: Amber [name]. 09/12/2020 2:45 PM Medical Record Number: [id number] Patient Account Number: [id number] Date of Birth/Sex: Treating RN: 02/06/1990 (30 y.o. Benjaman Lobe Primary Care Aycen Porreca: [place], [place] TT Other Clinician: Referring Kelsy Polack: Treating Noel Henandez/Extender: Johnn Hai, SCO TT Weeks in Treatment: 8 Encounter Discharge Information Items Discharge Condition: Stable Ambulatory Status: Ambulatory Discharge Destination: Home Transportation: Private Auto Accompanied By: SELF Schedule Follow-up Appointment: Yes Clinical Summary of Care: Electronic Signature(s) Signed: 09/13/2020 4:23:50 PM By: Rhae Hammock RN Entered By: Rhae Hammock on 09/12/2020 16:47:26 -------------------------------------------------------------------------------- Lower Extremity Assessment Details Patient Name: Date of Service: Amber [name]. 09/12/2020 2:45 PM Medical Record Number: [id number] Patient Account Number: [id number] Date of Birth/Sex: Treating RN: [birth year] (30 y.o. Benjaman Lobe Primary Care Luan Urbani: [place], [place] TT Other Clinician: Referring Vikki Gains: Treating Klare Criss/Extender: Johnn Hai, SCO TT Weeks in Treatment: 8 Edema Assessment Assessed: [Left: Yes] [Right: No] Edema: [Left: N] [Right: o] Calf Left: Right: Point of Measurement: 26 cm From Medial Instep 32.1 cm Ankle Left: Right: Point of Measurement: 7 cm From Medial Instep 22.4 cm Vascular Assessment Pulses: Dorsalis Pedis Palpable: [Left:Yes] Posterior Tibial Palpable: [Left:Yes] Electronic Signature(s) Signed: 09/13/2020 4:23:50 PM By: Rhae Hammock RN Entered By: Rhae Hammock on 09/12/2020 15:14:00 -------------------------------------------------------------------------------- Multi Wound Chart Details Patient Name: Date of Service: Amber [name]. 09/12/2020 2:45 PM Medical Record Number: [id number] Patient Account Number: [id number] Date of Birth/Sex: Treating RN: Oct 22, 1990 (30 y.o. Nancy Fetter Primary Care Reygan Heagle: [place], [place] TT Other Clinician: Referring Mearl Harewood: Treating Janesia Joswick/Extender: Johnn Hai, SCO TT Weeks in Treatment: 8 Vital Signs Height(in): 62 Pulse(bpm): 60 Weight(lbs): 102 Blood  Pressure(mmHg): 115/75 Body Mass Index(BMI): 19 Temperature(F): 98.4 Respiratory Rate(breaths/min): 17 Photos: [N/A:N/A] Right, Anterior Lower Leg N/A N/A Wound Location: Gradually Appeared N/A N/A Wounding Event: Abscess N/A N/A Primary Etiology: Hepatitis C, Neuropathy N/A N/A Comorbid History: 06/21/2020 N/A N/A Date Acquired: 8 N/A N/A Weeks of Treatment: Open N/A N/A Wound Status: 0.1x0.1x0.1 N/A N/A Measurements L x W x D (cm) 0.008 N/A N/A A (cm) : rea  0.001 N/A N/A Volume (cm) : 93.20% N/A N/A % Reduction in A rea: 95.80% N/A N/A % Reduction in Volume: Full Thickness Without Exposed N/A N/A Classification: Support Structures Small N/A N/A Exudate Amount: Serous N/A N/A Exudate Type: amber N/A N/A Exudate Color: Well defined, not attached N/A N/A Wound Margin: Large (67-100%) N/A N/A Granulation Amount: Pink N/A N/A Granulation Quality: None Present (0%) N/A N/A Necrotic Amount: Fat Layer (Subcutaneous Tissue): Yes N/A N/A Exposed Structures: Fascia: No Tendon: No Muscle: No Joint: No Bone: No Large (67-100%) N/A N/A Epithelialization: Treatment Notes Electronic Signature(s) Signed: 09/12/2020 4:28:25 PM By: Linton Ham MD Signed: 09/13/2020 5:19:32 PM By: Levan Hurst RN, BSN Entered By: Linton Ham on 09/12/2020 15:37:48 -------------------------------------------------------------------------------- Multi-Disciplinary Care Plan Details Patient Name: Date of Service: Amber [name]. 09/12/2020 2:45 PM Medical Record Number: [id number] Patient Account Number: [id number] Date of Birth/Sex: Treating RN: 01-18-1990 (30 y.o. Benjaman Lobe Primary Care Korrin Waterfield: [place], [place] TT Other Clinician: Referring Samiksha Pellicano: Treating Donna Silverman/Extender: Johnn Hai, SCO TT Weeks in Treatment: 8 [place] reviewed with physician Active Inactive Wound/Skin Impairment Nursing Diagnoses: Impaired tissue  integrity Knowledge deficit related to ulceration/compromised skin integrity Goals: Patient/caregiver will verbalize understanding of skin care regimen Date Initiated: 07/18/2020 Target Resolution Date: 10/04/2020 Goal Status: Active Ulcer/skin breakdown will have a volume reduction of 30% by week 4 Date Initiated: 07/18/2020 Date Inactivated: 08/29/2020 Target Resolution Date: 08/17/2020 Goal Status: Met Interventions: Assess patient/caregiver ability to obtain necessary supplies Assess patient/caregiver ability to perform ulcer/skin care regimen upon admission and as needed Assess ulceration(s) every visit Provide education on ulcer and skin care Notes: Electronic Signature(s) Signed: 09/13/2020 4:23:50 PM By: Rhae Hammock RN Entered By: Rhae Hammock on 09/12/2020 16:00:15 -------------------------------------------------------------------------------- Pain Assessment Details Patient Name: Date of Service: Amber [name]. 09/12/2020 2:45 PM Medical Record Number: [id number] Patient Account Number: [id number] Date of Birth/Sex: Treating RN: Apr 02, 1990 (30 y.o. Nancy Fetter Primary Care Susie Pousson: [place], [place] TT Other Clinician: Referring Chrissa Meetze: Treating Cali Hope/Extender: Johnn Hai, [place] TT Weeks in Treatment: 8 Active Problems Location of Pain Severity and Description of Pain Patient Has Paino No Site Locations Pain Management and Medication Current Pain Management: Electronic Signature(s) Signed: 09/12/2020 3:05:52 PM By: Sandre Kitty Signed: 09/13/2020 5:19:32 PM By: Levan Hurst RN, BSN Entered By: Sandre Kitty on 09/12/2020 14:58:34 -------------------------------------------------------------------------------- Patient/Caregiver Education Details Patient Name: Date of Service: RO Leonard Schwartz 8/31/2022andnbsp2:45 PM Medical Record Number: [id number] Patient Account Number: [id number] Date of Birth/Gender: Treating  RN: [birth year] (30 y.o. Benjaman Lobe Primary Care Physician: [place], [place] TT Other Clinician: Referring Physician: Treating Physician/Extender: Johnn Hai, SCO TT Weeks in Treatment: 8 Education Assessment Education Provided To: Patient Education Topics Provided Wound/Skin Impairment: Methods: Explain/Verbal Responses: State content correctly [employer]) Signed: 09/13/2020 4:23:50 PM By: Rhae Hammock RN Entered By: Rhae Hammock on 09/12/2020 16:00:26 -------------------------------------------------------------------------------- Wound Assessment Details Patient Name: Date of Service: Amber [name]. 09/12/2020 2:45 PM Medical Record Number: [id number] Patient Account Number: [id number] Date of Birth/Sex: Treating RN: [birth year] (30 y.o. Nancy Fetter Primary Care Rosealynn Mateus: Other Clinician: [facility], [place] Referring Abrish Erny: Treating Wladyslawa Disbro/Extender: Johnn Hai, SCO TT Weeks in Treatment: 8 Wound Status Wound Number: 1 Primary Etiology: Abscess Wound Location: Right, Anterior Lower Leg Wound Status: Open Wounding Event: Gradually Appeared Comorbid History: Hepatitis C, Neuropathy Date Acquired: 06/21/2020 Weeks Of Treatment: 8 Clustered Wound: No Photos Wound Measurements Length: (cm) 0.1 Width: (cm) 0.1 Depth: (cm) 0.1 Area: (  cm) 0.008 Volume: (cm) 0.001 % Reduction in Area: 93.2% % Reduction in Volume: 95.8% Epithelialization: Large (67-100%) Wound Description Classification: Full Thickness Without Exposed Support Structures Wound Margin: Well defined, not attached Exudate Amount: Small Exudate Type: Serous Exudate Color: amber Foul Odor After Cleansing: No Slough/Fibrino Yes Wound Bed Granulation Amount: Large (67-100%) Exposed Structure Granulation Quality: Pink Fascia Exposed: No Necrotic Amount: None Present (0%) Fat Layer (Subcutaneous Tissue) Exposed: Yes Tendon Exposed: No Muscle  Exposed: No Joint Exposed: No Bone Exposed: No Treatment Notes Wound #1 (Lower Leg) Wound Laterality: Right, Anterior Cleanser Soap and Water Discharge Instruction: May shower and wash wound with dial antibacterial soap and water prior to dressing change. Peri-Wound Care Skin Prep Discharge Instruction: Use skin prep as directed Topical Triple Antibiotic Ointment, 1 (oz) Tube Primary Dressing Secondary Dressing Bordered Gauze, 2x2 in Discharge Instruction: Apply over primary dressing as directed. Secured With Compression Wrap Compression Stockings Environmental education officer) Signed: 09/12/2020 3:05:52 PM By: Sandre Kitty Signed: 09/13/2020 5:19:32 PM By: Levan Hurst RN, BSN Entered By: Sandre Kitty on 09/12/2020 15:02:04 -------------------------------------------------------------------------------- Vitals Details Patient Name: Date of Service: Amber [name]. 09/12/2020 2:45 PM Medical Record Number: [id number] Patient Account Number: [id number] Date of Birth/Sex: Treating RN: 09-04-1990 (30 y.o. Nancy Fetter Primary Care Ransome Helwig: [place], [place] TT Other Clinician: Referring Seletha Zimmermann: Treating Alani Lacivita/Extender: Johnn Hai, SCO TT Weeks in Treatment: 8 Vital Signs Time Taken: 14:58 Temperature (F): 98.4 Height (in): 62 Pulse (bpm): 77 Weight (lbs): 102 Respiratory Rate (breaths/min): 17 Body Mass Index (BMI): 18.7 Blood Pressure (mmHg): 115/75 Reference Range: 80 - 120 mg / dl Electronic Signature(s) Signed: 09/12/2020 3:05:52 PM By: Sandre Kitty Entered By: Sandre Kitty on 09/12/2020 14:58:28

## 2020-09-18 ENCOUNTER — Other Ambulatory Visit (HOSPITAL_COMMUNITY): Payer: Self-pay

## 2020-09-18 ENCOUNTER — Encounter (HOSPITAL_COMMUNITY): Payer: Self-pay | Admitting: Cardiology

## 2020-09-18 ENCOUNTER — Ambulatory Visit (HOSPITAL_COMMUNITY)
Admission: RE | Admit: 2020-09-18 | Discharge: 2020-09-18 | Disposition: A | Discharge status: Home / Self Care | Payer: Medicaid Other | Source: Ambulatory Visit | Attending: Cardiology | Admitting: Cardiology

## 2020-09-18 ENCOUNTER — Other Ambulatory Visit: Payer: Self-pay

## 2020-09-18 VITALS — BP 92/60 | HR 80 | Wt 102.0 lb

## 2020-09-18 DIAGNOSIS — I079 Rheumatic tricuspid valve disease, unspecified: Secondary | ICD-10-CM | POA: Insufficient documentation

## 2020-09-18 DIAGNOSIS — I5022 Chronic systolic (congestive) heart failure: Secondary | ICD-10-CM

## 2020-09-18 DIAGNOSIS — Z8614 Personal history of Methicillin resistant Staphylococcus aureus infection: Secondary | ICD-10-CM | POA: Insufficient documentation

## 2020-09-18 DIAGNOSIS — I50810 Right heart failure, unspecified: Secondary | ICD-10-CM

## 2020-09-18 DIAGNOSIS — Z86711 Personal history of pulmonary embolism: Secondary | ICD-10-CM | POA: Insufficient documentation

## 2020-09-18 DIAGNOSIS — I071 Rheumatic tricuspid insufficiency: Secondary | ICD-10-CM

## 2020-09-18 DIAGNOSIS — F17210 Nicotine dependence, cigarettes, uncomplicated: Secondary | ICD-10-CM | POA: Insufficient documentation

## 2020-09-18 DIAGNOSIS — Z79899 Other long term (current) drug therapy: Secondary | ICD-10-CM | POA: Insufficient documentation

## 2020-09-18 DIAGNOSIS — M009 Pyogenic arthritis, unspecified: Secondary | ICD-10-CM | POA: Insufficient documentation

## 2020-09-18 DIAGNOSIS — F1290 Cannabis use, unspecified, uncomplicated: Secondary | ICD-10-CM | POA: Insufficient documentation

## 2020-09-18 LAB — BRAIN NATRIURETIC PEPTIDE: B Natriuretic Peptide: 101.9 pg/mL — ABNORMAL HIGH (ref 0.0–100.0)

## 2020-09-18 MED ORDER — SODIUM CHLORIDE 0.9% FLUSH: 3.0000 mL | Freq: Two times a day (BID) | INTRAVENOUS | Status: DC
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
## 2020-09-18 NOTE — Patient Instructions (Signed)
No medication changes today  EKG done in office  Labs today We will only contact you if something comes back abnormal or we need to make some changes. Otherwise no news is good news!   Your physician has requested that you have an echocardiogram. Echocardiography is a painless test that uses sound waves to create images of your heart. It provides your doctor with information about the size and shape of your heart and how well your heart's chambers and valves are working. This procedure takes approximately one hour. There are no restrictions for this procedure.  Your physician recommends that you schedule a follow-up appointment in: 4 weeks with Dr Shirlee Latch  Please call office at [phone] option 2 if you have any questions or concerns.    [facility] [address] [id number] [place] [address] Dept: [id number] Loc: [phone]  Amber [name]  09/18/2020  You are scheduled for a Cardiac Catheterization on Tuesday, September 13 with Dr. Marca Ancona.  1. Please arrive at the [facility] (Main Entrance A) at [facility]: [address] [place], [place] [address] at 8:30 AM (This time is two hours before your procedure to ensure your preparation). Free valet parking service is available.   Special note: Every effort is made to have your procedure done on time. Please understand that emergencies sometimes delay scheduled procedures.   2. Diet: Do not eat solid foods after midnight.     3. Labs: Drawn today in office   4. Medication instructions in preparation for your procedure:   Take morning medications with a small sip of water.  HOLD Advil/Motrin for 24hrs before your procedure  5. Plan for one night stay--bring personal belongings. 6. Bring a current list of your medications and current insurance cards. 7. You MUST have a responsible person to drive you home. 8. Someone  MUST be with you the first 24 hours after you arrive home or your discharge will be delayed. 9. Please wear clothes that are easy to get on and off and wear slip-on shoes.  Thank you for allowing [name] to care for you!   -- [facility]
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
## 2020-09-19 ENCOUNTER — Encounter (HOSPITAL_BASED_OUTPATIENT_CLINIC_OR_DEPARTMENT_OTHER): Payer: Medicaid Other | Attending: Internal Medicine | Admitting: Internal Medicine

## 2020-09-19 DIAGNOSIS — L97818 Non-pressure chronic ulcer of other part of right lower leg with other specified severity: Secondary | ICD-10-CM | POA: Insufficient documentation

## 2020-09-19 DIAGNOSIS — B182 Chronic viral hepatitis C: Secondary | ICD-10-CM | POA: Insufficient documentation

## 2020-09-19 DIAGNOSIS — I361 Nonrheumatic tricuspid (valve) insufficiency: Secondary | ICD-10-CM | POA: Insufficient documentation

## 2020-09-19 DIAGNOSIS — Z91030 Bee allergy status: Secondary | ICD-10-CM | POA: Insufficient documentation

## 2020-09-19 DIAGNOSIS — Z86711 Personal history of pulmonary embolism: Secondary | ICD-10-CM | POA: Insufficient documentation

## 2020-09-20 ENCOUNTER — Other Ambulatory Visit (HOSPITAL_COMMUNITY)
Admit: 2020-09-20 | Discharge: 2020-09-20 | Disposition: A | Discharge status: Home / Self Care | Payer: Medicaid Other | Source: Ambulatory Visit | Attending: Internal Medicine | Admitting: Internal Medicine

## 2020-09-20 DIAGNOSIS — L97818 Non-pressure chronic ulcer of other part of right lower leg with other specified severity: Secondary | ICD-10-CM | POA: Diagnosis not present

## 2020-09-20 DIAGNOSIS — L0889 Other specified local infections of the skin and subcutaneous tissue: Secondary | ICD-10-CM | POA: Insufficient documentation

## 2020-09-20 DIAGNOSIS — B9562 Methicillin resistant Staphylococcus aureus infection as the cause of diseases classified elsewhere: Secondary | ICD-10-CM | POA: Insufficient documentation

## 2020-09-20 NOTE — Progress Notes (Addendum)
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 Facility Procedures CPT4 Code: [id number] Description: 10060 - I and D Abscess; simple/single ICD-10 Diagnosis Description L97.818 Non-pressure chronic ulcer of other part of right lower leg with other specifie Modifier: d severity Quantity: 1 Physician Procedures : CPT4 Code Description Modifier [id number] 10060 - I and D Abscess; simple/single ICD-10 Diagnosis Description L97.818 Non-pressure chronic ulcer of other part of right lower leg with other specified severity Quantity: 1 Electronic Signature(s) Signed: 09/19/2020 3:40:46 PM By: Baltazar Najjarobson, Dimond Crotty MD Signed: 09/20/2020 6:08:32 PM By: Fonnie MuBreedlove, Lauren RN Entered By: Fonnie MuBreedlove, Lauren on 09/19/2020 14:39:45

## 2020-09-20 NOTE — Progress Notes (Addendum)
Virnig, Colie L. ([id number]) Visit Report for 09/19/2020 Arrival Information Details Patient Name: Date of Service: Amber [name] 09/19/2020 12:30 PM Medical Record Number: [id number] Patient Account Number: [id number] Date of Birth/Sex: Treating RN: 1990-02-04 (30 y.o. Toniann Fail Primary Care Amber [name]: [place], [place] Other Clinician: Referring Amber [name]: Treating Amber [name]/Extender: Lehman Prom, SCO TT Weeks in Treatment: 9 Visit Information History Since Last Visit Added or deleted any medications: No Patient Arrived: Ambulatory Any new allergies or adverse reactions: No Arrival Time: 12:43 Had a fall or experienced change in No Accompanied By: self activities of daily living that may affect Transfer Assistance: None risk of falls: Patient Identification Verified: Yes Signs or symptoms of abuse/neglect since last visito No Secondary Verification Process Completed: Yes Hospitalized since last visit: No Patient Requires Transmission-Based Precautions: No Implantable device outside of the clinic excluding No Patient Has Alerts: No cellular tissue based products placed in the center since last visit: Has Dressing in Place as Prescribed: Yes Pain Present Now: No Electronic Signature(s) Signed: 09/20/2020 6:08:32 PM By: Fonnie Mu RN Entered By: Fonnie Mu on 09/19/2020 12:43:54 -------------------------------------------------------------------------------- Encounter Discharge Information Details Patient Name: Date of Service: Amber [name], Amber L. 09/19/2020 12:30 PM Medical Record Number: [id number] Patient Account Number: [id number] Date of Birth/Sex: Treating RN: 01-07-1991 (30 y.o. Toniann Fail Primary Care Amber [name]: Amber [name] Other Clinician: Referring Amber [name]: Treating Amber [name]/Extender: Lehman Prom, SCO TT Weeks in Treatment: 9 Encounter Discharge Information Items Discharge Condition: Stable Ambulatory  Status: Ambulatory Discharge Destination: Home Transportation: Private Auto Accompanied By: self Schedule Follow-up Appointment: Yes Clinical Summary of Care: Patient Declined Electronic Signature(s) Signed: 09/20/2020 6:08:32 PM By: Fonnie Mu RN Entered By: Fonnie Mu on 09/19/2020 13:21:05 -------------------------------------------------------------------------------- Lower Extremity Assessment Details Patient Name: Date of Service: Amber Fuchs. 09/19/2020 12:30 PM Medical Record Number: [id number] Patient Account Number: [id number] Date of Birth/Sex: Treating RN: Apr 07, 1990 (30 y.o. Toniann Fail Primary Care Breawna Montenegro: Amber [name] Other Clinician: Referring Marleni Gallardo: Treating Aveya Beal/Extender: Lehman Prom, SCO TT Weeks in Treatment: 9 Edema Assessment Assessed: [Left: Yes] [Right: No] Edema: [Left: N] [Right: o] Calf Left: Right: Point of Measurement: 26 cm From Medial Instep 32.1 cm Ankle Left: Right: Point of Measurement: 7 cm From Medial Instep 22.4 cm Vascular Assessment Pulses: Dorsalis Pedis Palpable: [Left:Yes] Posterior Tibial Palpable: [Left:Yes] Electronic Signature(s) Signed: 09/20/2020 6:08:32 PM By: Fonnie Mu RN Entered By: Fonnie Mu on 09/19/2020 12:44:24 -------------------------------------------------------------------------------- Multi Wound Chart Details Patient Name: Date of Service: Amber [name], Amber L. 09/19/2020 12:30 PM Medical Record Number: [id number] Patient Account Number: [id number] Date of Birth/Sex: Treating RN: Jul 25, 1990 (30 y.o. Wynelle Link Primary Care Farzad Tibbetts: Amber [name] Other Clinician: Referring Aleaha Fickling: Treating Heberto Sturdevant/Extender: Lehman Prom, SCO TT Weeks in Treatment: 9 Vital Signs Height(in): 62 Pulse(bpm): 74 Weight(lbs): 102 Blood Pressure(mmHg): 115/74 Body Mass Index(BMI): 19 Temperature(F): 97.4 Respiratory Rate(breaths/min):  17 Photos: [1:No Photos Right, Anterior Lower Leg] [N/A:N/A N/A] Wound Location: [1:Gradually Appeared] [N/A:N/A] Wounding Event: [1:Abscess] [N/A:N/A] Primary Etiology: [1:Hepatitis C, Neuropathy] [N/A:N/A] Comorbid History: [1:06/21/2020] [N/A:N/A] Date Acquired: [1:9] [N/A:N/A] Weeks of Treatment: [1:Open] [N/A:N/A] Wound Status: [1:0.1x0.1x0.1] [N/A:N/A] Measurements L x W x D (cm) [1:0.008] [N/A:N/A] A (cm) : rea [1:0.001] [N/A:N/A] Volume (cm) : [1:93.20%] [N/A:N/A] % Reduction in Area: [1:95.80%] [N/A:N/A] % Reduction in Volume: [1:Full Thickness Without Exposed] [N/A:N/A] Classification: [1:Support Structures Small] [N/A:N/A] Exudate Amount: [1:Serous] [N/A:N/A] Exudate Type: [1:Amber] [N/A:N/A] Exudate Color: [1:Well defined, not attached] [N/A:N/A] Wound Margin: [1:Large (67-100%)] [  N/A:N/A] Granulation Amount: [1:Pink] [N/A:N/A] Granulation Quality: [1:None Present (0%)] [N/A:N/A] Necrotic Amount: [1:Fat Layer (Subcutaneous Tissue): Yes N/A] Exposed Structures: [1:Fascia: No Tendon: No Muscle: No Joint: No Bone: No Large (67-100%)] [N/A:N/A] Treatment Notes Wound #1 (Lower Leg) Wound Laterality: Right, Anterior Cleanser Soap and Water Discharge Instruction: May shower and wash wound with dial antibacterial soap and water prior to dressing change. Peri-Wound Care Skin Prep Discharge Instruction: Use skin prep as directed Topical Primary Dressing KerraCel Ag Gelling Fiber Dressing, 4x5 in (silver alginate) Discharge Instruction: Apply silver alginate to wound bed as instructed Secondary Dressing Bordered Gauze, 2x2 in Discharge Instruction: Apply over primary dressing as directed. Secured With Compression Wrap Compression Stockings Facilities manager) Signed: 09/19/2020 3:40:46 PM By: Baltazar Najjar MD Signed: 09/19/2020 5:49:01 PM By: Zandra Abts RN, BSN Entered By: Baltazar Najjar on 09/19/2020  13:31:23 -------------------------------------------------------------------------------- Multi-Disciplinary Care Plan Details Patient Name: Date of Service: Amber Divers L. 09/19/2020 12:30 PM Medical Record Number: [id number] Patient Account Number: [id number] Date of Birth/Sex: Treating RN: 11-12-1990 (30 y.o. Toniann Fail Primary Care Gilberte Gorley: [place], [place] NA Other Clinician: Referring Deangela Randleman: Treating Erskin Zinda/Extender: Lehman Prom, SCO TT Weeks in Treatment: 9 Multidisciplinary Care Plan reviewed with physician Active Inactive Electronic Signature(s) Signed: 01/31/2021 12:39:41 PM By: Fonnie Mu RN Previous Signature: 09/20/2020 6:08:32 PM Version By: Fonnie Mu RN Entered By: Fonnie Mu on 10/23/2020 09:50:12 -------------------------------------------------------------------------------- Pain Assessment Details Patient Name: Date of Service: Amber [name], Amber L. 09/19/2020 12:30 PM Medical Record Number: [id number] Patient Account Number: [id number] Date of Birth/Sex: Treating RN: [birth year] (30 y.o. Toniann Fail Primary Care Natika Geyer: [place], [place] R[place] Other Clinician: Referring Shandreka Dante: Treating Meaghann Choo/Extender: Lehman Prom, SCO TT Weeks in Treatment: 9 Active Problems Location of Pain Severity and Description of Pain Patient Has Paino No Site Locations Pain Management and Medication Current Pain Management: Electronic Signature(s) Signed: 09/20/2020 6:08:32 PM By: Fonnie Mu RN Entered By: Fonnie Mu on 09/19/2020 12:44:15 -------------------------------------------------------------------------------- Patient/Caregiver Education Details Patient Name: Date of Service: RO Lorne Skeens 9/7/2022andnbsp12:30 PM Medical Record Number: [id number] Patient Account Number: [id number] Date of Birth/Gender: Treating RN: 06/05/1990 (30 y.o. Toniann Fail Primary Care Physician: [place], [place]  [place] Other Clinician: Referring Physician: Treating Physician/Extender: Lehman Prom, SCO TT Weeks in Treatment: 9 Education Assessment Education Provided To: Patient Education Topics Provided Wound/Skin Impairment: Methods: Explain/Verbal Responses: Reinforcements needed Electronic Signature(s) Signed: 09/20/2020 6:08:32 PM By: Fonnie Mu RN Entered By: Fonnie Mu on 09/19/2020 13:16:26 -------------------------------------------------------------------------------- Wound Assessment Details Patient Name: Date of Service: Amber [name], Amber L. 09/19/2020 12:30 PM Medical Record Number: [id number] Patient Account Number: [id number] Date of Birth/Sex: Treating RN: May 09, 1990 (30 y.o. Toniann Fail Primary Care Anan Dapolito: [place], [place] Other Clinician: Referring Christmas Faraci: Treating Tyrhonda Georgiades/Extender: Lehman Prom, SCO TT Weeks in Treatment: 9 Wound Status Wound Number: 1 Primary Etiology: Abscess Wound Location: Right, Anterior Lower Leg Wound Status: Open Wounding Event: Gradually Appeared Comorbid History: Hepatitis C, Neuropathy Date Acquired: 06/21/2020 Weeks Of Treatment: 9 Clustered Wound: No Photos Photo Uploaded By: Haywood Pao on 10/27/2020 19:20:30 Wound Measurements Length: (cm) 0.1 Width: (cm) 0.1 Depth: (cm) 0.1 Area: (cm) 0.008 Volume: (cm) 0.001 % Reduction in Area: 93.2% % Reduction in Volume: 95.8% Epithelialization: Large (67-100%) Tunneling: No Undermining: No Wound Description Classification: Full Thickness Without Exposed Support Structures Wound Margin: Well defined, not attached Exudate Amount: Small Exudate Type: Serous Exudate Color: Amber Foul Odor After Cleansing: No Slough/Fibrino Yes Wound Bed Granulation Amount: Large (67-100%) Exposed  Structure Granulation Quality: Pink Fascia Exposed: No Necrotic Amount: None Present (0%) Fat Layer (Subcutaneous Tissue) Exposed: Yes Tendon Exposed:  No Muscle Exposed: No Joint Exposed: No Bone Exposed: No Electronic Signature(s) Signed: 09/20/2020 6:08:32 PM By: Fonnie Mu RN Entered By: Fonnie Mu on 09/19/2020 13:05:29 -------------------------------------------------------------------------------- Vitals Details Patient Name: Date of Service: Amber [name], Amber L. 09/19/2020 12:30 PM Medical Record Number: [id number] Patient Account Number: [id number] Date of Birth/Sex: Treating RN: 1990-12-23 (30 y.o. Toniann Fail Primary Care Angellina Ferdinand: [place], [place] Other Clinician: Referring Kaedyn Belardo: Treating Kainat Pizana/Extender: Lehman Prom, SCO TT Weeks in Treatment: 9 Vital Signs Time Taken: 12:43 Temperature (F): 97.4 Height (in): 62 Pulse (bpm): 74 Weight (lbs): 102 Respiratory Rate (breaths/min): 17 Body Mass Index (BMI): 18.7 Blood Pressure (mmHg): 115/74 Reference Range: 80 - 120 mg / dl Electronic Signature(s) Signed: 09/20/2020 6:08:32 PM By: Fonnie Mu RN Entered By: Fonnie Mu on 09/19/2020 12:44:10

## 2020-09-22 LAB — AEROBIC CULTURE W GRAM STAIN (SUPERFICIAL SPECIMEN): Gram Stain: NONE SEEN

## 2020-09-24 ENCOUNTER — Ambulatory Visit (INDEPENDENT_AMBULATORY_CARE_PROVIDER_SITE_OTHER): Payer: Self-pay | Admitting: Orthopedic Surgery

## 2020-09-24 ENCOUNTER — Other Ambulatory Visit: Payer: Self-pay

## 2020-09-24 ENCOUNTER — Other Ambulatory Visit: Payer: Self-pay | Admitting: Physician Assistant

## 2020-09-24 DIAGNOSIS — T847XXA Infection and inflammatory reaction due to other internal orthopedic prosthetic devices, implants and grafts, initial encounter: Secondary | ICD-10-CM

## 2020-09-24 DIAGNOSIS — M86461 Chronic osteomyelitis with draining sinus, right tibia and fibula: Secondary | ICD-10-CM

## 2020-09-25 ENCOUNTER — Ambulatory Visit (HOSPITAL_COMMUNITY)
Admission: RE | Admit: 2020-09-25 | Discharge: 2020-09-25 | Disposition: A | Discharge status: Home / Self Care | Payer: Medicaid Other | Attending: Cardiology | Admitting: Cardiology

## 2020-09-25 ENCOUNTER — Other Ambulatory Visit: Payer: Self-pay

## 2020-09-25 ENCOUNTER — Encounter (HOSPITAL_COMMUNITY): Payer: Self-pay | Admitting: Cardiology

## 2020-09-25 ENCOUNTER — Ambulatory Visit (HOSPITAL_COMMUNITY)
Admission: RE | Disposition: A | Discharge status: Home / Self Care | Payer: Self-pay | Source: Home / Self Care | Attending: Cardiology

## 2020-09-25 DIAGNOSIS — F17210 Nicotine dependence, cigarettes, uncomplicated: Secondary | ICD-10-CM | POA: Insufficient documentation

## 2020-09-25 DIAGNOSIS — I272 Pulmonary hypertension, unspecified: Secondary | ICD-10-CM

## 2020-09-25 DIAGNOSIS — Z86711 Personal history of pulmonary embolism: Secondary | ICD-10-CM | POA: Insufficient documentation

## 2020-09-25 DIAGNOSIS — F1911 Other psychoactive substance abuse, in remission: Secondary | ICD-10-CM | POA: Insufficient documentation

## 2020-09-25 DIAGNOSIS — I071 Rheumatic tricuspid insufficiency: Secondary | ICD-10-CM | POA: Insufficient documentation

## 2020-09-25 DIAGNOSIS — Z79899 Other long term (current) drug therapy: Secondary | ICD-10-CM | POA: Insufficient documentation

## 2020-09-25 DIAGNOSIS — I50810 Right heart failure, unspecified: Secondary | ICD-10-CM

## 2020-09-25 HISTORY — PX: RIGHT HEART CATH: CATH118263

## 2020-09-25 LAB — POCT I-STAT EG7
Acid-Base Excess: 0 mmol/L (ref 0.0–2.0)
Acid-Base Excess: 1 mmol/L (ref 0.0–2.0)
Bicarbonate: 26.3 mmol/L (ref 20.0–28.0)
Bicarbonate: 27.4 mmol/L (ref 20.0–28.0)
Calcium, Ion: 1.2 mmol/L (ref 1.15–1.40)
Calcium, Ion: 1.27 mmol/L (ref 1.15–1.40)
HCT: 40 % (ref 36.0–46.0)
HCT: 42 % (ref 36.0–46.0)
Hemoglobin: 13.6 g/dL (ref 12.0–15.0)
Hemoglobin: 14.3 g/dL (ref 12.0–15.0)
O2 Saturation: 69 %
O2 Saturation: 70 %
Potassium: 3.8 mmol/L (ref 3.5–5.1)
Potassium: 4 mmol/L (ref 3.5–5.1)
Sodium: 141 mmol/L (ref 135–145)
Sodium: 143 mmol/L (ref 135–145)
TCO2: 28 mmol/L (ref 22–32)
TCO2: 29 mmol/L (ref 22–32)
pCO2, Ven: 50.1 mmHg (ref 44.0–60.0)
pCO2, Ven: 51 mmHg (ref 44.0–60.0)
pH, Ven: 7.328 (ref 7.250–7.430)
pH, Ven: 7.338 (ref 7.250–7.430)
pO2, Ven: 39 mmHg (ref 32.0–45.0)
pO2, Ven: 39 mmHg (ref 32.0–45.0)

## 2020-09-25 LAB — CBC
HCT: 44 % (ref 36.0–46.0)
Hemoglobin: 14.2 g/dL (ref 12.0–15.0)
MCH: 27.4 pg (ref 26.0–34.0)
MCHC: 32.3 g/dL (ref 30.0–36.0)
MCV: 84.8 fL (ref 80.0–100.0)
Platelets: 170 10*3/uL (ref 150–400)
RBC: 5.19 MIL/uL — ABNORMAL HIGH (ref 3.87–5.11)
RDW: 16.7 % — ABNORMAL HIGH (ref 11.5–15.5)
WBC: 4 10*3/uL (ref 4.0–10.5)
nRBC: 0 % (ref 0.0–0.2)

## 2020-09-25 LAB — BASIC METABOLIC PANEL
Anion gap: 6 (ref 5–15)
BUN: 8 mg/dL (ref 6–20)
CO2: 27 mmol/L (ref 22–32)
Calcium: 9.4 mg/dL (ref 8.9–10.3)
Chloride: 104 mmol/L (ref 98–111)
Creatinine, Ser: 0.72 mg/dL (ref 0.44–1.00)
GFR, Estimated: >60 mL/min (ref >60)
Glucose, Bld: 88 mg/dL (ref 70–99)
Potassium: 4.4 mmol/L (ref 3.5–5.1)
Sodium: 137 mmol/L (ref 135–145)

## 2020-09-25 LAB — PREGNANCY, URINE: Preg Test, Ur: NEGATIVE

## 2020-09-25 SURGERY — RIGHT HEART CATH
Anesthesia: LOCAL

## 2020-09-25 MED ORDER — SODIUM CHLORIDE 0.9 % IV SOLN: INTRAVENOUS | Status: DC

## 2020-09-25 MED ORDER — SODIUM CHLORIDE 0.9 % IV SOLN: 250.0000 mL | INTRAVENOUS | Status: DC | PRN

## 2020-09-25 MED ORDER — ONDANSETRON HCL 4 MG/2ML IJ SOLN: 4.0000 mg | Freq: Four times a day (QID) | INTRAMUSCULAR | Status: DC | PRN

## 2020-09-25 MED ORDER — FENTANYL CITRATE (PF) 100 MCG/2ML IJ SOLN
INTRAMUSCULAR | Status: DC | PRN
  Administered 2020-09-25: 12.5 ug via INTRAVENOUS

## 2020-09-25 MED ORDER — LIDOCAINE HCL (PF) 1 % IJ SOLN
INTRAMUSCULAR | Status: DC | PRN
  Administered 2020-09-25 (×2): 3 mL

## 2020-09-25 MED ORDER — ACETAMINOPHEN 325 MG PO TABS: 650.0000 mg | ORAL_TABLET | ORAL | Status: DC | PRN

## 2020-09-25 MED ORDER — FENTANYL CITRATE (PF) 100 MCG/2ML IJ SOLN
INTRAMUSCULAR | Status: AC
  Filled 2020-09-25: qty 2

## 2020-09-25 MED ORDER — HEPARIN (PORCINE) IN NACL 1000-0.9 UT/500ML-% IV SOLN
INTRAVENOUS | Status: DC | PRN
  Administered 2020-09-25: 500 mL

## 2020-09-25 MED ORDER — HEPARIN (PORCINE) IN NACL 1000-0.9 UT/500ML-% IV SOLN
INTRAVENOUS | Status: AC
  Filled 2020-09-25: qty 500

## 2020-09-25 MED ORDER — ASPIRIN 81 MG PO CHEW: 81.0000 mg | CHEWABLE_TABLET | ORAL | Status: DC

## 2020-09-25 MED ORDER — SODIUM CHLORIDE 0.9% FLUSH: 3.0000 mL | INTRAVENOUS | Status: DC | PRN

## 2020-09-25 MED ORDER — LIDOCAINE HCL (PF) 1 % IJ SOLN
INTRAMUSCULAR | Status: AC
  Filled 2020-09-25: qty 30

## 2020-09-25 MED ORDER — LABETALOL HCL 5 MG/ML IV SOLN: 10.0000 mg | INTRAVENOUS | Status: DC | PRN

## 2020-09-25 MED ORDER — MIDAZOLAM HCL 2 MG/2ML IJ SOLN
INTRAMUSCULAR | Status: DC | PRN
  Administered 2020-09-25: .5 mg via INTRAVENOUS

## 2020-09-25 MED ORDER — HYDRALAZINE HCL 20 MG/ML IJ SOLN: 10.0000 mg | INTRAMUSCULAR | Status: DC | PRN

## 2020-09-25 MED ORDER — SODIUM CHLORIDE 0.9% FLUSH: 3.0000 mL | Freq: Two times a day (BID) | INTRAVENOUS | Status: DC

## 2020-09-25 MED ORDER — MIDAZOLAM HCL 2 MG/2ML IJ SOLN
INTRAMUSCULAR | Status: AC
  Filled 2020-09-25: qty 2

## 2020-09-25 SURGICAL SUPPLY — 7 items
CATH BALLN WEDGE 5F 110CM (CATHETERS) ×1 IMPLANT
KIT HEART LEFT (KITS) ×2 IMPLANT
PACK CARDIAC CATHETERIZATION (CUSTOM PROCEDURE TRAY) ×2 IMPLANT
SHEATH GLIDE SLENDER 4/5FR (SHEATH) ×1 IMPLANT
SHEATH PROBE COVER 6X72 (BAG) ×1 IMPLANT
TRANSDUCER W/STOPCOCK (MISCELLANEOUS) ×2 IMPLANT
WIRE MICROINTRODUCER 60CM (WIRE) ×1 IMPLANT

## 2020-09-25 NOTE — Interval H&P Note (Signed)
History and Physical Interval Note:  09/25/2020 8:36 AM  Amber [name]  has presented today for surgery, with the diagnosis of CHF.  The various methods of treatment have been discussed with the patient and family. After consideration of risks, benefits and other options for treatment, the patient has consented to  Procedure(s): RIGHT HEART CATH (N/A) as a surgical intervention.  The patient's history has been reviewed, patient examined, no change in status, stable for surgery.  I have reviewed the patient's chart and labs.  Questions were answered to the patient's satisfaction.     Novalie Leamy [employer]

## 2020-09-26 ENCOUNTER — Ambulatory Visit (INDEPENDENT_AMBULATORY_CARE_PROVIDER_SITE_OTHER): Payer: Self-pay | Admitting: Infectious Diseases

## 2020-09-26 ENCOUNTER — Other Ambulatory Visit: Payer: Self-pay

## 2020-09-26 ENCOUNTER — Encounter (HOSPITAL_COMMUNITY): Payer: Self-pay | Admitting: Orthopedic Surgery

## 2020-09-26 VITALS — Ht 62.0 in | Wt 102.0 lb

## 2020-09-26 DIAGNOSIS — I079 Rheumatic tricuspid valve disease, unspecified: Secondary | ICD-10-CM

## 2020-09-26 DIAGNOSIS — Z5181 Encounter for therapeutic drug level monitoring: Secondary | ICD-10-CM | POA: Insufficient documentation

## 2020-09-26 DIAGNOSIS — B182 Chronic viral hepatitis C: Secondary | ICD-10-CM | POA: Insufficient documentation

## 2020-09-26 DIAGNOSIS — M869 Osteomyelitis, unspecified: Secondary | ICD-10-CM

## 2020-09-26 MED FILL — Heparin Sod (Porcine)-NaCl IV Soln 1000 Unit/500ML-0.9%: INTRAVENOUS | Qty: 500 | Status: AC

## 2020-09-26 NOTE — Progress Notes (Signed)
PCP - [facility]  Cardiologist - Dr. Shirlee Latch  EP-Denies  Endocrine-Denies  Pulm-Denies  Chest x-ray - 01/10/20 (E)  EKG - 09/18/20 (E)  Stress Test - Denies  ECHO - 12/12/19 (E)  Cardiac Cath - 09/25/20 (E)  AICD-na PM-na LOOP-na  Dialysis-Denies  Sleep Study - Denies CPAP - Denies  LABS- 09/25/20 (E): CBC, BMP, UPreg 09/28/20: COVID, PCR  ASA-Denies  ERAS- Yes- Clears until 0530  HA1C- Denies  Anesthesia- Yes- Cardiac history- PA Revonda Standard made aware  Pt denies having chest pain, sob, or fever during the pre-op phone call. All instructions explained to the pt, with a verbal understanding of the material including: as of today, stop taking all Aspirin (unless instructed by your doctor) and Other Aspirin containing products, Vitamins, Fish oils, and Herbal medications. Also stop all NSAIDS i.e. Advil, Ibuprofen, Motrin, Aleve, Anaprox, Naproxen, BC, Goody Powders, and all Supplements. Pt also instructed to wear a mask and social distance if she has to go out prior to her procedure. The opportunity to ask questions was provided.    Coronavirus Screening  Have you experienced the following symptoms:  Cough yes/no: No Fever (>100.39F)  yes/no: No Runny nose yes/no: No Sore throat yes/no: No Difficulty breathing/shortness of breath  yes/no: No  Have you or a family member traveled in the last 14 days and where? yes/no: No   If the patient indicates "YES" to the above questions, their PAT will be rescheduled to limit the exposure to others and, the surgeon will be notified. THE PATIENT WILL NEED TO BE ASYMPTOMATIC FOR 14 DAYS.   If the patient is not experiencing any of these symptoms, the PAT nurse will instruct them to NOT bring anyone with them to their appointment since they may have these symptoms or traveled as well.   Please remind your patients and families that hospital visitation restrictions are in effect and the importance of the restrictions.

## 2020-09-26 NOTE — Anesthesia Preprocedure Evaluation (Addendum)
Anesthesia Evaluation  Patient identified by MRN, date of birth, ID band Patient awake    Reviewed: Allergy & Precautions, H&P , NPO status , Patient's Chart, lab work & pertinent test results  History of Anesthesia Complications (+) PROLONGED EMERGENCE and history of anesthetic complications  Airway Mallampati: II  TM Distance: >3 FB Neck ROM: Full    Dental no notable dental hx. (+) Teeth Intact, Dental Advisory Given   Pulmonary neg pulmonary ROS, Current Smoker and Patient abstained from smoking.,    Pulmonary exam normal breath sounds clear to auscultation       Cardiovascular Exercise Tolerance: Good hypertension, Normal cardiovascular exam+ Valvular Problems/Murmurs  Rhythm:Regular Rate:Normal  RHC 09/25/20: 1. Normal PA pressure.  2. Normal PCWP 3. RA pressure normal but there are prominent V-waves suggestive of significant tricuspid regurgitation.  - This study is suggestive of well-compensated tricuspid regurgitation.    MRI Cardiac 05/25/20: IMPRESSION: 1. 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 Severe tricuspid regurgitation (regurgitant fraction 62%) 3. Normal LV size and systolic function (EF 57%). Septal flattening during diastole consistent with RV volume overload 4. RV insertion site late gadolinium enhancement, which is a nonspecific finding often seen in setting of elevated pulmonary pressures   Echo (limited, post Angiovac TV vegetation) 12/12/19: IMPRESSIONS  1. Left ventricular ejection fraction, by estimation, is 55 to 60%. The  left ventricle has normal function.  2. Right ventricular systolic function is mildly reduced. The right  ventricular size is moderately enlarged. There is normal pulmonary artery  systolic pressure. The estimated right ventricular systolic pressure is  28.8 mmHg  3. A small pericardial effusion is present.  4. The inferior  vena cava is normal in size with <50% respiratory  variability, suggesting right atrial pressure of 8 mmHg.  5. Compared to prior echo, large vegetation on septal leaflet of  tricuspid valve is no longer seen. However there remains poor coaptation  of tricuspid valve leaflets. Tricuspid valve regurgitation is severe.     Neuro/Psych PSYCHIATRIC DISORDERS Anxiety Depression negative neurological ROS     GI/Hepatic negative GI ROS, (+)     substance abuse  IV drug use, Hepatitis -, C  Endo/Other  negative endocrine ROS  Renal/GU ARFRenal disease  negative genitourinary   Musculoskeletal negative musculoskeletal ROS (+) narcotic dependent  Abdominal   Peds negative pediatric ROS (+)  Hematology  (+) Blood dyscrasia, anemia ,   Anesthesia Other Findings   Reproductive/Obstetrics negative OB ROS                           Anesthesia Physical Anesthesia Plan  ASA: 4  Anesthesia Plan: General   Post-op Pain Management:    Induction: Intravenous  PONV Risk Score and Plan: 3 and Ondansetron, Dexamethasone and Treatment may vary due to age or medical condition  Airway Management Planned: LMA  Additional Equipment: None  Intra-op Plan:   Post-operative Plan:   Informed Consent: I have reviewed the patients History and Physical, chart, labs and discussed the procedure including the risks, benefits and alternatives for the proposed anesthesia with the patient or authorized representative who has indicated his/her understanding and acceptance.       Plan Discussed with: CRNA, Surgeon and Anesthesiologist  Anesthesia Plan Comments: (See PAT note written 09/26/2020 by Shonna Chock, PA-C. She is being considered for TV repair for severe TR but needs RLE infection addressed first. 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 HF cardiologist is Dr. Shirlee Latch. )     Anesthesia Quick Evaluation
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
## 2020-09-27 ENCOUNTER — Encounter (HOSPITAL_BASED_OUTPATIENT_CLINIC_OR_DEPARTMENT_OTHER): Payer: Medicaid Other | Admitting: Internal Medicine

## 2020-09-27 ENCOUNTER — Inpatient Hospital Stay (HOSPITAL_COMMUNITY): Admission: RE | Admit: 2020-09-27 | Payer: Medicaid Other | Source: Ambulatory Visit

## 2020-09-27 ENCOUNTER — Telehealth: Payer: Self-pay

## 2020-09-27 NOTE — Telephone Encounter (Signed)
Patient's family called to cancel IV infusion today due to anxiety of patient related to surgery tomorrow. RN contacted Short Stay to cancel infusion appointment. Will forward to provider to make her aware. Patient will be admitted to hospital pending surgery and will reschedule while admitted.   Broghan Pannone Loyola Mast, RN

## 2020-09-28 ENCOUNTER — Inpatient Hospital Stay (HOSPITAL_COMMUNITY)
Admission: RE | Admit: 2020-09-28 | Discharge: 2020-10-02 | DRG: 493 | Disposition: A | Discharge status: Home / Self Care | Payer: Self-pay | Attending: Orthopedic Surgery | Admitting: Orthopedic Surgery

## 2020-09-28 ENCOUNTER — Inpatient Hospital Stay (HOSPITAL_COMMUNITY): Payer: Self-pay | Admitting: Vascular Surgery

## 2020-09-28 ENCOUNTER — Encounter (HOSPITAL_COMMUNITY): Payer: Self-pay | Admitting: Orthopedic Surgery

## 2020-09-28 ENCOUNTER — Other Ambulatory Visit: Payer: Self-pay

## 2020-09-28 ENCOUNTER — Encounter (HOSPITAL_COMMUNITY)
Admission: RE | Disposition: A | Discharge status: Home / Self Care | Payer: Self-pay | Source: Home / Self Care | Attending: Orthopedic Surgery

## 2020-09-28 DIAGNOSIS — Z87442 Personal history of urinary calculi: Secondary | ICD-10-CM

## 2020-09-28 DIAGNOSIS — F17210 Nicotine dependence, cigarettes, uncomplicated: Secondary | ICD-10-CM | POA: Diagnosis present

## 2020-09-28 DIAGNOSIS — Z79899 Other long term (current) drug therapy: Secondary | ICD-10-CM

## 2020-09-28 DIAGNOSIS — L03116 Cellulitis of left lower limb: Secondary | ICD-10-CM | POA: Diagnosis present

## 2020-09-28 DIAGNOSIS — Z86711 Personal history of pulmonary embolism: Secondary | ICD-10-CM

## 2020-09-28 DIAGNOSIS — I10 Essential (primary) hypertension: Secondary | ICD-10-CM | POA: Diagnosis present

## 2020-09-28 DIAGNOSIS — B9562 Methicillin resistant Staphylococcus aureus infection as the cause of diseases classified elsewhere: Secondary | ICD-10-CM | POA: Diagnosis present

## 2020-09-28 DIAGNOSIS — L02415 Cutaneous abscess of right lower limb: Secondary | ICD-10-CM | POA: Diagnosis present

## 2020-09-28 DIAGNOSIS — T847XXS Infection and inflammatory reaction due to other internal orthopedic prosthetic devices, implants and grafts, sequela: Secondary | ICD-10-CM

## 2020-09-28 DIAGNOSIS — Z20822 Contact with and (suspected) exposure to covid-19: Secondary | ICD-10-CM | POA: Diagnosis present

## 2020-09-28 DIAGNOSIS — A4902 Methicillin resistant Staphylococcus aureus infection, unspecified site: Secondary | ICD-10-CM

## 2020-09-28 DIAGNOSIS — T84622A Infection and inflammatory reaction due to internal fixation device of right tibia, initial encounter: Principal | ICD-10-CM | POA: Diagnosis present

## 2020-09-28 DIAGNOSIS — M869 Osteomyelitis, unspecified: Secondary | ICD-10-CM | POA: Diagnosis present

## 2020-09-28 DIAGNOSIS — Z23 Encounter for immunization: Secondary | ICD-10-CM

## 2020-09-28 DIAGNOSIS — Y831 Surgical operation with implant of artificial internal device as the cause of abnormal reaction of the patient, or of later complication, without mention of misadventure at the time of the procedure: Secondary | ICD-10-CM | POA: Diagnosis present

## 2020-09-28 HISTORY — PX: HARDWARE REMOVAL: SHX979

## 2020-09-28 HISTORY — DX: Endocarditis, valve unspecified: I38

## 2020-09-28 HISTORY — DX: Personal history of urinary calculi: Z87.442

## 2020-09-28 HISTORY — DX: Inflammatory liver disease, unspecified: K75.9

## 2020-09-28 HISTORY — DX: Essential (primary) hypertension: I10

## 2020-09-28 HISTORY — DX: Other complications of anesthesia, initial encounter: T88.59XA

## 2020-09-28 LAB — SEDIMENTATION RATE: Sed Rate: 9 mm/h (ref 0–20)

## 2020-09-28 LAB — BASIC METABOLIC PANEL
Anion gap: 8 (ref 5–15)
BUN: 10 mg/dL (ref 6–20)
CO2: 24 mmol/L (ref 22–32)
Calcium: 9.2 mg/dL (ref 8.9–10.3)
Chloride: 105 mmol/L (ref 98–111)
Creatinine, Ser: 0.72 mg/dL (ref 0.44–1.00)
GFR, Estimated: >60 mL/min (ref >60)
Glucose, Bld: 106 mg/dL — ABNORMAL HIGH (ref 70–99)
Potassium: 4.1 mmol/L (ref 3.5–5.1)
Sodium: 137 mmol/L (ref 135–145)

## 2020-09-28 LAB — HEPATITIS C RNA QUANTITATIVE
HCV Quantitative Log: <1.18 log IU/mL
HCV RNA, PCR, QN: <15 IU/mL

## 2020-09-28 LAB — POCT PREGNANCY, URINE: Preg Test, Ur: NEGATIVE

## 2020-09-28 LAB — SARS CORONAVIRUS 2 BY RT PCR (HOSPITAL ORDER, PERFORMED IN ~~LOC~~ HOSPITAL LAB): SARS Coronavirus 2: NEGATIVE

## 2020-09-28 LAB — C-REACTIVE PROTEIN: CRP: 2.3 mg/L (ref <8.0)

## 2020-09-28 SURGERY — REMOVAL, HARDWARE
Anesthesia: General | Laterality: Right

## 2020-09-28 MED ORDER — OXYCODONE HCL 5 MG/5ML PO SOLN
5.0000 mg | Freq: Once | ORAL | Status: DC | PRN
Start: 2020-09-28 — End: 2020-09-28

## 2020-09-28 MED ORDER — BUPIVACAINE HCL (PF) 0.25 % IJ SOLN
INTRAMUSCULAR | Status: AC
  Filled 2020-09-28: qty 20

## 2020-09-28 MED ORDER — DOCUSATE SODIUM 100 MG PO CAPS
100.0000 mg | ORAL_CAPSULE | Freq: Two times a day (BID) | ORAL | Status: DC
  Administered 2020-09-28 – 2020-10-02 (×9): 100 mg via ORAL
  Filled 2020-09-28 (×9): qty 1

## 2020-09-28 MED ORDER — SODIUM CHLORIDE 0.9 % IV SOLN: INTRAVENOUS | Status: DC

## 2020-09-28 MED ORDER — DEXMEDETOMIDINE (PRECEDEX) IN NS 20 MCG/5ML (4 MCG/ML) IV SYRINGE
PREFILLED_SYRINGE | INTRAVENOUS | Status: DC | PRN
  Administered 2020-09-28 (×3): 4 ug via INTRAVENOUS

## 2020-09-28 MED ORDER — FENTANYL CITRATE (PF) 100 MCG/2ML IJ SOLN
INTRAMUSCULAR | Status: AC
  Filled 2020-09-28: qty 2

## 2020-09-28 MED ORDER — KETOROLAC TROMETHAMINE 30 MG/ML IJ SOLN
INTRAMUSCULAR | Status: AC
  Filled 2020-09-28: qty 1

## 2020-09-28 MED ORDER — ONDANSETRON HCL 4 MG/2ML IJ SOLN: 4.0000 mg | Freq: Once | INTRAMUSCULAR | Status: DC | PRN

## 2020-09-28 MED ORDER — BUPRENORPHINE HCL-NALOXONE HCL 8-2 MG SL SUBL
2.0000 | SUBLINGUAL_TABLET | Freq: Every day | SUBLINGUAL | Status: DC
Start: 2020-09-28 — End: 2020-10-02
  Administered 2020-09-29 – 2020-10-02 (×4): 2 via SUBLINGUAL
  Filled 2020-09-28 (×5): qty 2

## 2020-09-28 MED ORDER — ONDANSETRON HCL 4 MG/2ML IJ SOLN
INTRAMUSCULAR | Status: AC
  Filled 2020-09-28: qty 2

## 2020-09-28 MED ORDER — TORSEMIDE 10 MG PO TABS
10.0000 mg | ORAL_TABLET | Freq: Every day | ORAL | Status: DC | PRN
  Filled 2020-09-28: qty 1

## 2020-09-28 MED ORDER — MIDAZOLAM HCL 5 MG/5ML IJ SOLN
INTRAMUSCULAR | Status: DC | PRN
  Administered 2020-09-28: 2 mg via INTRAVENOUS

## 2020-09-28 MED ORDER — LIDOCAINE 2% (20 MG/ML) 5 ML SYRINGE
INTRAMUSCULAR | Status: AC
  Filled 2020-09-28: qty 15

## 2020-09-28 MED ORDER — OXYCODONE HCL 5 MG PO TABS
5.0000 mg | ORAL_TABLET | ORAL | Status: DC | PRN
  Administered 2020-09-28 (×2): 10 mg via ORAL
  Filled 2020-09-28 (×2): qty 2

## 2020-09-28 MED ORDER — ASPIRIN EC 325 MG PO TBEC
325.0000 mg | DELAYED_RELEASE_TABLET | Freq: Every day | ORAL | Status: DC
Start: 2020-09-28 — End: 2020-10-02
  Administered 2020-09-28 – 2020-10-02 (×5): 325 mg via ORAL
  Filled 2020-09-28 (×5): qty 1

## 2020-09-28 MED ORDER — METOCLOPRAMIDE HCL 5 MG/ML IJ SOLN: 5.0000 mg | Freq: Three times a day (TID) | INTRAMUSCULAR | Status: DC | PRN

## 2020-09-28 MED ORDER — VANCOMYCIN HCL IN DEXTROSE 1-5 GM/200ML-% IV SOLN
1000.0000 mg | Freq: Once | INTRAVENOUS | Status: AC
  Administered 2020-09-28: 1000 mg via INTRAVENOUS
  Filled 2020-09-28 (×2): qty 200

## 2020-09-28 MED ORDER — ONDANSETRON HCL 4 MG PO TABS: 4.0000 mg | ORAL_TABLET | Freq: Four times a day (QID) | ORAL | Status: DC | PRN

## 2020-09-28 MED ORDER — CHLORHEXIDINE GLUCONATE 0.12 % MT SOLN
OROMUCOSAL | Status: AC
  Administered 2020-09-28: 15 mL via OROMUCOSAL
  Filled 2020-09-28: qty 15

## 2020-09-28 MED ORDER — LACTATED RINGERS IV SOLN: INTRAVENOUS | Status: DC

## 2020-09-28 MED ORDER — CEFAZOLIN SODIUM-DEXTROSE 2-4 GM/100ML-% IV SOLN
2.0000 g | INTRAVENOUS | Status: AC
  Administered 2020-09-28: 2 g via INTRAVENOUS

## 2020-09-28 MED ORDER — HYDROMORPHONE HCL 1 MG/ML IJ SOLN
INTRAMUSCULAR | Status: AC
  Filled 2020-09-28: qty 0.5

## 2020-09-28 MED ORDER — METOCLOPRAMIDE HCL 5 MG PO TABS: 5.0000 mg | ORAL_TABLET | Freq: Three times a day (TID) | ORAL | Status: DC | PRN

## 2020-09-28 MED ORDER — DEXAMETHASONE SODIUM PHOSPHATE 10 MG/ML IJ SOLN
INTRAMUSCULAR | Status: DC | PRN
  Administered 2020-09-28: 5 mg via INTRAVENOUS

## 2020-09-28 MED ORDER — GENTAMICIN SULFATE 40 MG/ML IJ SOLN
INTRAMUSCULAR | Status: AC
  Filled 2020-09-28: qty 6

## 2020-09-28 MED ORDER — ORAL CARE MOUTH RINSE: 15.0000 mL | Freq: Once | OROMUCOSAL | Status: AC

## 2020-09-28 MED ORDER — ONDANSETRON HCL 4 MG/2ML IJ SOLN: 4.0000 mg | Freq: Four times a day (QID) | INTRAMUSCULAR | Status: DC | PRN

## 2020-09-28 MED ORDER — DEXAMETHASONE SODIUM PHOSPHATE 10 MG/ML IJ SOLN
INTRAMUSCULAR | Status: AC
  Filled 2020-09-28: qty 1

## 2020-09-28 MED ORDER — MEPERIDINE HCL 25 MG/ML IJ SOLN: 6.2500 mg | INTRAMUSCULAR | Status: DC | PRN

## 2020-09-28 MED ORDER — DIPHENHYDRAMINE HCL 50 MG/ML IJ SOLN
INTRAMUSCULAR | Status: AC
  Filled 2020-09-28: qty 1

## 2020-09-28 MED ORDER — GENTAMICIN SULFATE 40 MG/ML IJ SOLN
INTRAMUSCULAR | Status: DC | PRN
  Administered 2020-09-28 (×3): 60 mg

## 2020-09-28 MED ORDER — KETOROLAC TROMETHAMINE 30 MG/ML IJ SOLN
INTRAMUSCULAR | Status: DC | PRN
  Administered 2020-09-28: 30 mg via INTRAVENOUS

## 2020-09-28 MED ORDER — HYDROMORPHONE HCL 1 MG/ML IJ SOLN
INTRAMUSCULAR | Status: DC | PRN
  Administered 2020-09-28: .5 mg via INTRAVENOUS

## 2020-09-28 MED ORDER — KETOROLAC TROMETHAMINE 15 MG/ML IJ SOLN
15.0000 mg | Freq: Four times a day (QID) | INTRAMUSCULAR | Status: AC
  Administered 2020-09-28 – 2020-09-29 (×4): 15 mg via INTRAVENOUS
  Filled 2020-09-28 (×4): qty 1

## 2020-09-28 MED ORDER — GLYCOPYRROLATE 0.2 MG/ML IJ SOLN
INTRAMUSCULAR | Status: DC | PRN
  Administered 2020-09-28 (×2): .1 mg via INTRAVENOUS

## 2020-09-28 MED ORDER — BUPIVACAINE-EPINEPHRINE (PF) 0.25% -1:200000 IJ SOLN
INTRAMUSCULAR | Status: AC
  Filled 2020-09-28: qty 30

## 2020-09-28 MED ORDER — ESCITALOPRAM OXALATE 20 MG PO TABS
20.0000 mg | ORAL_TABLET | Freq: Every day | ORAL | Status: DC
  Administered 2020-09-28 – 2020-10-01 (×4): 20 mg via ORAL
  Filled 2020-09-28 (×4): qty 1

## 2020-09-28 MED ORDER — VANCOMYCIN HCL 1000 MG IV SOLR
INTRAVENOUS | Status: AC
  Filled 2020-09-28: qty 20

## 2020-09-28 MED ORDER — FENTANYL CITRATE (PF) 100 MCG/2ML IJ SOLN
25.0000 ug | INTRAMUSCULAR | Status: DC | PRN
  Administered 2020-09-28 (×2): 50 ug via INTRAVENOUS

## 2020-09-28 MED ORDER — 0.9 % SODIUM CHLORIDE (POUR BTL) OPTIME
TOPICAL | Status: DC | PRN
  Administered 2020-09-28: 1000 mL

## 2020-09-28 MED ORDER — DEXMEDETOMIDINE (PRECEDEX) IN NS 20 MCG/5ML (4 MCG/ML) IV SYRINGE
PREFILLED_SYRINGE | INTRAVENOUS | Status: AC
  Filled 2020-09-28: qty 5

## 2020-09-28 MED ORDER — ONDANSETRON HCL 4 MG/2ML IJ SOLN
INTRAMUSCULAR | Status: DC | PRN
Start: 2020-09-28 — End: 2020-09-28
  Administered 2020-09-28: 4 mg via INTRAVENOUS

## 2020-09-28 MED ORDER — MIDAZOLAM HCL 2 MG/2ML IJ SOLN
INTRAMUSCULAR | Status: AC
  Filled 2020-09-28: qty 2

## 2020-09-28 MED ORDER — GLYCOPYRROLATE PF 0.2 MG/ML IJ SOSY
PREFILLED_SYRINGE | INTRAMUSCULAR | Status: AC
  Filled 2020-09-28: qty 1

## 2020-09-28 MED ORDER — ACETAMINOPHEN 10 MG/ML IV SOLN
INTRAVENOUS | Status: DC | PRN
  Administered 2020-09-28: 1000 mg via INTRAVENOUS

## 2020-09-28 MED ORDER — ACETAMINOPHEN 160 MG/5ML PO SOLN: 325.0000 mg | ORAL | Status: DC | PRN

## 2020-09-28 MED ORDER — ACETAMINOPHEN 325 MG PO TABS: 325.0000 mg | ORAL_TABLET | ORAL | Status: DC | PRN

## 2020-09-28 MED ORDER — FENTANYL CITRATE (PF) 100 MCG/2ML IJ SOLN
INTRAMUSCULAR | Status: DC | PRN
  Administered 2020-09-28 (×5): 50 ug via INTRAVENOUS

## 2020-09-28 MED ORDER — LIDOCAINE 2% (20 MG/ML) 5 ML SYRINGE
INTRAMUSCULAR | Status: DC | PRN
  Administered 2020-09-28: 40 mg via INTRAVENOUS

## 2020-09-28 MED ORDER — VANCOMYCIN HCL IN DEXTROSE 1-5 GM/200ML-% IV SOLN
1000.0000 mg | INTRAVENOUS | Status: DC
  Administered 2020-09-29 – 2020-10-01 (×3): 1000 mg via INTRAVENOUS
  Filled 2020-09-28 (×4): qty 200

## 2020-09-28 MED ORDER — FENTANYL CITRATE (PF) 100 MCG/2ML IJ SOLN
INTRAMUSCULAR | Status: AC
  Administered 2020-09-28: 50 ug via INTRAVENOUS
  Filled 2020-09-28: qty 2

## 2020-09-28 MED ORDER — PROPOFOL 10 MG/ML IV BOLUS
INTRAVENOUS | Status: AC
  Filled 2020-09-28: qty 20

## 2020-09-28 MED ORDER — PROPOFOL 10 MG/ML IV BOLUS
INTRAVENOUS | Status: DC | PRN
  Administered 2020-09-28: 40 mg via INTRAVENOUS
  Administered 2020-09-28: 100 mg via INTRAVENOUS

## 2020-09-28 MED ORDER — VANCOMYCIN HCL 500 MG IV SOLR
INTRAVENOUS | Status: DC | PRN
  Administered 2020-09-28: 1000 mg via TOPICAL

## 2020-09-28 MED ORDER — MIRTAZAPINE 15 MG PO TABS: 15.0000 mg | ORAL_TABLET | Freq: Every evening | ORAL | Status: DC | PRN

## 2020-09-28 MED ORDER — SODIUM CHLORIDE 0.9 % IV SOLN
2.0000 g | Freq: Three times a day (TID) | INTRAVENOUS | Status: DC
  Administered 2020-09-28: 2 g via INTRAVENOUS
  Filled 2020-09-28: qty 2

## 2020-09-28 MED ORDER — CEFAZOLIN SODIUM-DEXTROSE 2-4 GM/100ML-% IV SOLN
INTRAVENOUS | Status: AC
  Filled 2020-09-28: qty 100

## 2020-09-28 MED ORDER — FENTANYL CITRATE (PF) 250 MCG/5ML IJ SOLN
INTRAMUSCULAR | Status: AC
  Filled 2020-09-28: qty 5

## 2020-09-28 MED ORDER — DIPHENHYDRAMINE HCL 50 MG/ML IJ SOLN
INTRAMUSCULAR | Status: DC | PRN
  Administered 2020-09-28: 6.25 mg via INTRAVENOUS

## 2020-09-28 MED ORDER — ACETAMINOPHEN 10 MG/ML IV SOLN
INTRAVENOUS | Status: AC
  Filled 2020-09-28: qty 100

## 2020-09-28 MED ORDER — KETOROLAC TROMETHAMINE 15 MG/ML IJ SOLN: 15.0000 mg | Freq: Four times a day (QID) | INTRAMUSCULAR | Status: DC

## 2020-09-28 MED ORDER — CHLORHEXIDINE GLUCONATE 0.12 % MT SOLN: 15.0000 mL | Freq: Once | OROMUCOSAL | Status: AC

## 2020-09-28 MED ORDER — OXYCODONE HCL 5 MG PO TABS: 5.0000 mg | ORAL_TABLET | Freq: Once | ORAL | Status: DC | PRN

## 2020-09-28 SURGICAL SUPPLY — 47 items
BAG COUNTER SPONGE SURGICOUNT (BAG) ×2 IMPLANT
BANDAGE ESMARK 6X9 LF (GAUZE/BANDAGES/DRESSINGS) IMPLANT
BNDG COHESIVE 1X5 TAN STRL LF (GAUZE/BANDAGES/DRESSINGS) ×2 IMPLANT
BNDG COHESIVE 4X5 TAN STRL (GAUZE/BANDAGES/DRESSINGS) IMPLANT
BNDG ESMARK 6X9 LF (GAUZE/BANDAGES/DRESSINGS)
BNDG GAUZE ELAST 4 BULKY (GAUZE/BANDAGES/DRESSINGS) ×2 IMPLANT
COVER SURGICAL LIGHT HANDLE (MISCELLANEOUS) ×4 IMPLANT
CUFF TOURN SGL QUICK 34 (TOURNIQUET CUFF)
CUFF TOURN SGL QUICK 42 (TOURNIQUET CUFF) IMPLANT
CUFF TRNQT CYL 34X4.125X (TOURNIQUET CUFF) IMPLANT
DRAPE C-ARM 42X72 X-RAY (DRAPES) IMPLANT
DRAPE INCISE IOBAN 66X45 STRL (DRAPES) IMPLANT
DRAPE ORTHO SPLIT 77X108 STRL (DRAPES)
DRAPE SURG ORHT 6 SPLT 77X108 (DRAPES) IMPLANT
DRAPE U-SHAPE 47X51 STRL (DRAPES) ×2 IMPLANT
DRESSING PEEL AND PLC PRVNA 13 (GAUZE/BANDAGES/DRESSINGS) ×1 IMPLANT
DRSG EMULSION OIL 3X3 NADH (GAUZE/BANDAGES/DRESSINGS) ×2 IMPLANT
DRSG PAD ABDOMINAL 8X10 ST (GAUZE/BANDAGES/DRESSINGS) ×2 IMPLANT
DRSG PEEL AND PLACE PREVENA 13 (GAUZE/BANDAGES/DRESSINGS) ×2
DURAPREP 26ML APPLICATOR (WOUND CARE) ×2 IMPLANT
ELECT REM PT RETURN 9FT ADLT (ELECTROSURGICAL) ×2
ELECTRODE REM PT RTRN 9FT ADLT (ELECTROSURGICAL) ×1 IMPLANT
GAUZE SPONGE 4X4 12PLY STRL (GAUZE/BANDAGES/DRESSINGS) ×2 IMPLANT
GLOVE SURG ORTHO LTX SZ9 (GLOVE) ×2 IMPLANT
GLOVE SURG UNDER POLY LF SZ9 (GLOVE) ×2 IMPLANT
GOWN STRL REUS W/ TWL XL LVL3 (GOWN DISPOSABLE) ×3 IMPLANT
GOWN STRL REUS W/TWL XL LVL3 (GOWN DISPOSABLE) ×3
KIT BASIN OR (CUSTOM PROCEDURE TRAY) ×2 IMPLANT
KIT STIMULAN  10CC (Orthopedic Implant) ×1 IMPLANT
KIT STIMULAN 10CC (Orthopedic Implant) ×1 IMPLANT
KIT TURNOVER KIT B (KITS) ×2 IMPLANT
MANIFOLD NEPTUNE II (INSTRUMENTS) ×2 IMPLANT
NS IRRIG 1000ML POUR BTL (IV SOLUTION) ×2 IMPLANT
PACK ORTHO EXTREMITY (CUSTOM PROCEDURE TRAY) ×2 IMPLANT
PAD ARMBOARD 7.5X6 YLW CONV (MISCELLANEOUS) ×4 IMPLANT
SPONGE T-LAP 18X18 ~~LOC~~+RFID (SPONGE) IMPLANT
STAPLER VISISTAT 35W (STAPLE) IMPLANT
STOCKINETTE IMPERVIOUS 9X36 MD (GAUZE/BANDAGES/DRESSINGS) IMPLANT
SUT ETHILON 2 0 PSLX (SUTURE) IMPLANT
SUT VIC AB 0 CT1 27 (SUTURE)
SUT VIC AB 0 CT1 27XBRD ANBCTR (SUTURE) IMPLANT
SUT VIC AB 2-0 CT1 27 (SUTURE)
SUT VIC AB 2-0 CT1 TAPERPNT 27 (SUTURE) IMPLANT
TOWEL GREEN STERILE (TOWEL DISPOSABLE) ×2 IMPLANT
TOWEL GREEN STERILE FF (TOWEL DISPOSABLE) ×2 IMPLANT
UNDERPAD 30X36 HEAVY ABSORB (UNDERPADS AND DIAPERS) ×2 IMPLANT
WATER STERILE IRR 1000ML POUR (IV SOLUTION) ×2 IMPLANT

## 2020-09-28 NOTE — Transfer of Care (Signed)
Immediate Anesthesia Transfer of Care Note  Patient: Amber [name]  Procedure(s) Performed: REMOVAL DEEP HARDWARE RIGHT TIBIA (Right)  Patient Location: PACU  Anesthesia Type:General  Level of Consciousness: drowsy  Airway & Oxygen Therapy: Patient Spontanous Breathing and Patient connected to face mask oxygen  Post-op Assessment: Report given to RN and Post -op Vital signs reviewed and stable  Post vital signs: Reviewed and stable  Last Vitals:  Vitals Value Taken Time  BP 114/84 09/28/20 0945  Temp 36.5 C 09/28/20 0945  Pulse 65 09/28/20 0946  Resp 19 09/28/20 0946  SpO2 100 % 09/28/20 0946  Vitals shown include unvalidated device data.  Last Pain:  Vitals:   09/28/20 0640  TempSrc:   PainSc: 0-No pain      Patients Stated Pain Goal: 3 (09/26/20 1316)  Complications: No notable events documented.

## 2020-09-28 NOTE — H&P (Signed)
Amber [name] is an 30 y.o. female.   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  Patient has had a chronic draining sinus tract with abscess and osteomyelitis of the proximal tibia.  She has failed prolonged conservative wound care.  Past Medical History:  Diagnosis Date   Complication of anesthesia    Hard to wake up   Endocarditis    Hepatitis    Hep C   Heroin abuse (HCC)    History of kidney stones    Hypertension    MRSA (methicillin resistant staph aureus) culture positive    Ovarian cyst    UTI (lower urinary tract infection)     Past Surgical History:  Procedure Laterality Date   APPLICATION OF ANGIOVAC N/A 12/05/2019   Procedure: APPLICATION OF ANGIOVAC;  Surgeon: Corliss Skains, MD;  Location: [facility] OR;  Service: Vascular;  Laterality: N/A;  Patient will need intraoperative TEE Can be performed in OR 14, 15, or 17 if OR 16 is not available   CARDIAC CATHETERIZATION     FINGER SURGERY     RIGHT HEART CATH N/A 09/25/2020   Procedure: RIGHT HEART CATH;  Surgeon: Laurey Morale, MD;  Location: [facility] INVASIVE CV LAB;  Service: Cardiovascular;  Laterality: N/A;   TEE WITHOUT CARDIOVERSION N/A 11/07/2019   Procedure: TRANSESOPHAGEAL ECHOCARDIOGRAM (TEE);  Surgeon: Wendall Stade, MD;  Location: [facility] ENDOSCOPY;  Service: Cardiovascular;  Laterality: N/A;   TEE WITHOUT CARDIOVERSION  12/05/2019   Procedure: TRANSESOPHAGEAL ECHOCARDIOGRAM (TEE);  Surgeon: Corliss Skains, MD;  Location: [facility] OR;  Service: Vascular;;   TEE WITHOUT CARDIOVERSION N/A 12/05/2019   Procedure: TRANSESOPHAGEAL ECHOCARDIOGRAM (TEE);  Surgeon: Corliss Skains, MD;  Location: [facility] OR;  Service: Thoracic;  Laterality: N/A;    Family History  Problem Relation Age of Onset   Valvular heart disease Maternal Grandmother    Social  History:  reports that she has been smoking cigarettes. She has never used smokeless tobacco. She reports current drug use. Drugs: Marijuana and IV. She reports that she does not drink alcohol.  Allergies:  Allergies  Allergen Reactions   Bee Venom Anaphylaxis   Other Anaphylaxis    Bee sting    Medications Prior to Admission  Medication Sig Dispense Refill   acetaminophen (TYLENOL) 500 MG tablet Take 1,000 mg by mouth every 6 (six) hours as needed for headache.      buprenorphine-naloxone (SUBOXONE) 8-2 mg SUBL SL tablet Place 2 tablets under the tongue daily.     escitalopram (LEXAPRO) 20 MG tablet Take 1 tablet (20 mg total) by mouth at bedtime. 30 tablet 6   gabapentin (NEURONTIN) 600 MG tablet Take 0.5 tablets (300 mg total) by mouth 3 (three) times daily. (Patient taking differently: Take 600 mg by mouth at bedtime.) 90 tablet 6   ibuprofen (ADVIL) 600 MG tablet Take 1 tablet (600 mg total) by mouth every 6 (six) hours as needed. 30 tablet 0   mirtazapine (REMERON) 15 MG tablet Take 15 mg by mouth at bedtime as needed (sleep).     mupirocin ointment (BACTROBAN) 2 % Apply 1 application topically daily.     OVER THE COUNTER MEDICATION Apply 1 application topically daily at 2 am. silver alginate mepilex     torsemide (DEMADEX) 10 MG tablet Take 1 tablet (10 mg total) by mouth daily as needed (AS NEEDED  FOR SHORTNESS OF BREATH OR SWELLING). 30 tablet 3   diclofenac Sodium (VOLTAREN) 1 % GEL Apply 4 g topically 4 (four) times daily. (Patient taking differently: Apply 4 g topically daily as needed (pain).) 4 g 6   doxycycline (MONODOX) 100 MG capsule Take 100 mg by mouth daily.     lidocaine (LIDODERM) 5 % Place 2 patches onto the skin daily as needed (pain). Remove & Discard patch within 12 hours or as directed by MD     Multiple Vitamin (MULTIVITAMIN WITH MINERALS) TABS tablet Take 1 tablet by mouth daily. 30 tablet 6    Results for orders placed or performed in visit on 09/26/20 (from  the past 48 hour(s))  Sedimentation rate     Status: None   Collection Time: 09/26/20  2:48 PM  Result Value Ref Range   Sed Rate 9 0 - 20 mm/h  C-reactive protein     Status: None   Collection Time: 09/26/20  2:48 PM  Result Value Ref Range   CRP 2.3 <8.0 mg/L   No results found.  Review of Systems  All other systems reviewed and are negative.  Blood pressure 103/66, pulse 60, temperature 98.2 F (36.8 C), temperature source Oral, resp. rate 18, height 5\' 1"  (1.549 m), weight 46.3 kg, SpO2 98 %. Physical Exam  Examination patient is alert oriented no adenopathy well-dressed normal affect normal respiratory effort she has a chronic draining sinus tract from the right proximal tibia lateral incision with exposed hardware.  Radiograph shows depression of the lateral tibial plateau with hardware loosening. Assessment/Plan Assessment: Infected proximal right tibia status post open reduction internal fixation with chronic draining sinus tract.  Plan: We will plan for removal of deep retained hardware placement of antibiotic beads and anticipate long-term antibiotics depending on culture results.  Risks and benefits were discussed including repeat debridement surgery persistent infection need for additional surgery.  Patient states she understands wished to proceed at this time.  , MD 09/28/2020, 6:54 AM

## 2020-09-28 NOTE — Anesthesia Postprocedure Evaluation (Signed)
Anesthesia Post Note  Patient: Amber [name]  Procedure(s) Performed: REMOVAL DEEP HARDWARE RIGHT TIBIA (Right)     Patient location during evaluation: PACU Anesthesia Type: General Level of consciousness: awake and alert Pain management: pain level controlled Vital Signs Assessment: post-procedure vital signs reviewed and stable Respiratory status: spontaneous breathing, nonlabored ventilation, respiratory function stable and patient connected to nasal cannula oxygen Cardiovascular status: blood pressure returned to baseline and stable Postop Assessment: no apparent nausea or vomiting Anesthetic complications: no   No notable events documented.  Last Vitals:  Vitals:   09/28/20 1100 09/28/20 1150  BP: 95/64 90/63  Pulse: 64 69  Resp: 17 16  Temp:  36.7 C  SpO2: 97% 97%    Last Pain:  Vitals:   09/28/20 1152  TempSrc:   PainSc: 4                  Dru Laurel

## 2020-09-28 NOTE — Anesthesia Procedure Notes (Signed)
Procedure Name: LMA Insertion Date/Time: 09/28/2020 8:51 AM Performed by: Marny Lowenstein, CRNA Pre-anesthesia Checklist: Patient identified, Emergency Drugs available, Suction available and Patient being monitored Patient Re-evaluated:Patient Re-evaluated prior to induction Oxygen Delivery Method: Circle system utilized Preoxygenation: Pre-oxygenation with 100% oxygen Induction Type: IV induction Ventilation: Mask ventilation without difficulty LMA: LMA inserted LMA Size: 3.0 Number of attempts: 1 Placement Confirmation: positive ETCO2 and breath sounds checked- equal and bilateral Tube secured with: Tape Dental Injury: Teeth and Oropharynx as per pre-operative assessment
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
## 2020-09-28 NOTE — Progress Notes (Addendum)
Pharmacy Antibiotic Note  Amber [name] is a 30 y.o. female admitted on 09/28/2020 with infected proximal right tibia status post open reduction internal fixation with chronic draining sinus tract. S/p removal of deep retained hardware on 9/16. Pharmacy has been consulted for vancomycin/cefepime dosing for osteomyelitis. SCr 0.72 (last 9/13). Surgical wound culture pending. Patient received pre-op dose of cefazolin today.  Plan: Cefepime 2g IV q8h Vancomycin 1g IV q24h. Goal AUC 400-550. Expected AUC: 449 SCr used: 0.8 Monitor clinical progress, c/s, renal function F/u de-escalation plan/LOT, vancomycin levels as indicated Recheck bmet today to ensure stable  ADDENDUM - SCr stable this afternoon at 0.72. Antibiotic dosing remains appropriate   Height: 5\' 1"  (154.9 cm) Weight: 46.3 kg (102 lb) IBW/kg (Calculated) : 47.8  Temp (24hrs), Avg:98 F (36.7 C), Min:97.7 F (36.5 C), Max:98.2 F (36.8 C)  Recent Labs  Lab 09/25/20 0637  WBC 4.0  CREATININE 0.72    Estimated Creatinine Clearance: 75.2 mL/min (by C-G formula based on SCr of 0.72 mg/dL).    Allergies  Allergen Reactions   Bee Venom Anaphylaxis   Other Anaphylaxis    Bee sting    Antimicrobials this admission: 9/16 vancomycin >>  9/16 cefepime >>   Dose adjustments this admission:   Microbiology results:   10/16, PharmD, BCPS Please check AMION for all [facility] contact numbers Clinical Pharmacist 09/28/2020 12:09 PM

## 2020-09-28 NOTE — Op Note (Signed)
09/28/2020  9:49 AM  PATIENT:  Amber [name]    PRE-OPERATIVE DIAGNOSIS:  Osteomyelitis and Hardware Infection Right Tibia  POST-OPERATIVE DIAGNOSIS:  Same  PROCEDURE:  REMOVAL DEEP HARDWARE RIGHT TIBIA Local tissue rearrangement for wound closure 13 x 4 cm. Placement of stimulant antibiotic beads with 1 g vancomycin and 240 mg gentamicin. Application of 13 cm Prevena wound VAC. Tissue sent for cultures. Excision of bone soft tissue muscle and fascia.  SURGEON:  Nadara Mustard, MD  PHYSICIAN ASSISTANT:None ANESTHESIA:   General  PREOPERATIVE INDICATIONS:  [name] is a  30 y.o. female with a diagnosis of Osteomyelitis and Hardware Infection Right Tibia who failed conservative measures and elected for surgical management.    The risks benefits and alternatives were discussed with the patient preoperatively including but not limited to the risks of infection, bleeding, nerve injury, cardiopulmonary complications, the need for revision surgery, among others, and the patient was willing to proceed.  OPERATIVE IMPLANTS: Antibiotic beads placed stimulant 10 g with 1 g vancomycin and 240 mg gentamicin  @ENCIMAGES @  OPERATIVE FINDINGS: Purulent drainage sinus tract tissue sent for cultures  OPERATIVE PROCEDURE: Patient was brought the operating room and underwent a general anesthetic.  After adequate levels anesthesia were obtained patient's right lower extremity was prepped using DuraPrep draped into a sterile field a timeout was called.  An elliptical incision was made around the ulcerative tissue this left a wound that was 13 x 4 cm.  The tissue and draining soft tissue tract was sent for tissue cultures.  The 8 screws and plate were removed 1 distal screw had broken but this was outside the zone of the infection in the screw within the bone was left in place.  Bone soft tissue muscle skin and fascia was excised.  Antibiotic beads were placed adjacent to the bone.  Local tissue  rearrangement was used to close the wound 13 x 4 cm.  A Prevena wound VAC was applied this was overwrapped with Coban.  This had a good suction fit.  The local wound was infiltrated with 10 cc of half percent Marcaine plain.  Patient was extubated taken the PACU in stable condition.  Debridement type: Excisional Debridement  Side: right  Body Location: proximal tibia   Tools used for debridement: scalpel, curette, and rongeur  Pre-debridement Wound size (cm):   Length: 1        Width: 1     Depth: 1   Post-debridement Wound size (cm):   Length: 13        Width: 4     Depth: 3   Debridement depth beyond dead/damaged tissue down to healthy viable tissue: yes  Tissue layer involved: skin, subcutaneous tissue, muscle / fascia, bone  Nature of tissue removed: Slough, Necrotic, Devitalized Tissue, Non-viable tissue, and Purulence  Irrigation volume: 1 liter     Irrigation fluid type: Normal Saline     DISCHARGE PLANNING:  Antibiotic duration: Antibiotics start with Vanco and Maxipime and adjust according to tissue cultures.  Weightbearing: Weightbearing as tolerated on the right  Pain medication: Opioid pathway.  Patient received Toradol and IV Tylenol interoperatively.  Patient received local infiltration locally.  Dressing care/ Wound VAC: Continue wound VAC for 1 week  Ambulatory devices: Walker or crutches  Discharge to: Anticipate discharge to home with antibiotics pending culture sensitivities.  Follow-up: In the office 1 week post operative.

## 2020-09-29 LAB — BASIC METABOLIC PANEL
Anion gap: 10 (ref 5–15)
BUN: 16 mg/dL (ref 6–20)
CO2: 24 mmol/L (ref 22–32)
Calcium: 9.6 mg/dL (ref 8.9–10.3)
Chloride: 101 mmol/L (ref 98–111)
Creatinine, Ser: 0.84 mg/dL (ref 0.44–1.00)
GFR, Estimated: >60 mL/min (ref >60)
Glucose, Bld: 144 mg/dL — ABNORMAL HIGH (ref 70–99)
Potassium: 4.1 mmol/L (ref 3.5–5.1)
Sodium: 135 mmol/L (ref 135–145)

## 2020-09-29 MED ORDER — SODIUM CHLORIDE 0.9 % IV SOLN
2.0000 g | Freq: Three times a day (TID) | INTRAVENOUS | Status: DC
  Administered 2020-09-29 – 2020-10-01 (×7): 2 g via INTRAVENOUS
  Filled 2020-09-29 (×8): qty 2

## 2020-09-29 MED ORDER — ACETAMINOPHEN 325 MG PO TABS
650.0000 mg | ORAL_TABLET | Freq: Four times a day (QID) | ORAL | Status: DC | PRN
  Administered 2020-09-29 – 2020-10-01 (×3): 650 mg via ORAL
  Filled 2020-09-29 (×3): qty 2

## 2020-09-29 MED ORDER — INFLUENZA VAC SPLIT QUAD 0.5 ML IM SUSY
0.5000 mL | PREFILLED_SYRINGE | INTRAMUSCULAR | Status: AC
  Administered 2020-10-01: 0.5 mL via INTRAMUSCULAR
  Filled 2020-09-29: qty 0.5

## 2020-09-29 NOTE — Progress Notes (Signed)
Patient ID: Amber [name], female   DOB: 1990-12-09, 30 y.o.   MRN: [id number] Patient is postoperative day 1 removal of deep infected hardware right leg.  There is no drainage in the wound VAC canister.  Cultures are pending.  I have added Maxipen to the vancomycin antibiotic regimen.

## 2020-09-29 NOTE — Evaluation (Signed)
Occupational Therapy Evaluation/Discharge Patient Details Name: Amber [name] MRN: [id number] DOB: August 04, 1990 Today's Date: 09/29/2020   History of Present Illness Pt is a 30 y/o female with chronic draining sinus tract with osteomyelitis of R proximal tibia. Pt underwent removal of R tibial hardward on 9/16. PMH: R tibial fx with ORIF, Hep C, endocarditis, heroin abuse, HTN, MRSA   Clinical Impression   PTA, pt lives with her mother and reports Independence in all daily tasks. Pt presents now s/p procedure above with tendency to avoid WB through R LE though pt verbalized understanding of WBAT precautions. Pt able to ambulate in room using RW at Supervision level without safety concerns and with no signs of increased pain. Pt overall Modified Independent with ADLs and verbalized understanding of safe DME use, techniques for wound vac mgmt in daily routine and compensatory strategies to decrease fall risk during ADLs. No further skilled OT services needed at this time. OT to sign off.       Recommendations for follow up therapy are one component of a multi-disciplinary discharge planning process, led by the attending physician.  Recommendations may be updated based on patient status, additional functional criteria and insurance authorization.   Follow Up Recommendations  No OT follow up    Equipment Recommendations  None recommended by OT    Recommendations for Other Services       Precautions / Restrictions Precautions Precautions: Fall;Other (comment) Precaution Comments: R tibial wound vac Restrictions Weight Bearing Restrictions: Yes RLE Weight Bearing: Weight bearing as tolerated      Mobility Bed Mobility Overal bed mobility: Modified Independent                  Transfers Overall transfer level: Modified independent Equipment used: None;Rolling walker (2 wheeled)             General transfer comment: initially stood without AD at bedside though LOB (self  NWB). Balance improved with use of RW    Balance Overall balance assessment: Needs assistance Sitting-balance support: No upper extremity supported;Feet supported Sitting balance-[name]: Normal     Standing balance support: During functional activity;No upper extremity supported;Bilateral upper extremity supported Standing balance-[name]: Fair Standing balance comment: fair static standing, reliant on BUE support for mobility due to self NWB                           ADL either performed or assessed with clinical judgement   ADL Overall ADL's : Needs assistance/impaired Eating/Feeding: Independent   Grooming: Modified independent;Standing   Upper Body Bathing: Independent   Lower Body Bathing: Modified independent;Sitting/lateral leans;Sit to/from stand   Upper Body Dressing : Independent   Lower Body Dressing: Modified independent;Sitting/lateral leans;Sit to/from stand Lower Body Dressing Details (indicate cue type and reason): educated on strategies to don affected LE first and doff last. Educated on easier clothing to Production manager: Supervision/safety;Ambulation;RW Toilet Transfer Details (indicate cue type and reason): for safety Toileting- Clothing Manipulation and Hygiene: Modified independent;Sitting/lateral lean;Sit to/from stand       Functional mobility during ADLs: Supervision/safety;Rolling walker General ADL Comments: Pt able to move relatively well without signs of increased pain. Pt verbalized understanding of all education for ADL techniques     Vision Baseline Vision/History: 0 No visual deficits Ability to See in Adequate Light: 0 Adequate Patient Visual Report: No change from baseline Vision Assessment?: No apparent visual deficits     Perception  Praxis      Pertinent Vitals/Pain Pain Assessment: Faces Faces Pain Scale: No hurt Pain Intervention(s): Monitored during session     Hand Dominance Right    Extremity/Trunk Assessment Upper Extremity Assessment Upper Extremity Assessment: Overall WFL for tasks assessed   Lower Extremity Assessment Lower Extremity Assessment: Defer to PT evaluation   Cervical / Trunk Assessment Cervical / Trunk Assessment: Normal   Communication Communication Communication: No difficulties   Cognition Arousal/Alertness: Awake/alert Behavior During Therapy: WFL for tasks assessed/performed Overall Cognitive Status: Within Functional Limits for tasks assessed                                     General Comments       Exercises     Shoulder Instructions      Home Living Family/patient expects to be discharged to:: Private residence Living Arrangements: Parent Available Help at Discharge: Family;Available 24 hours/day Type of Home: House Home Access: Stairs to enter [employer] of Steps: 1 threshold   Home Layout: One level     Bathroom Shower/Tub: Tub/shower unit;Walk-in shower   Bathroom Toilet: Standard     Home Equipment: Shower seat - built in;Walker - 2 wheels;Shower seat          Prior Functioning/Environment Level of Independence: Independent        Comments: lives with mother, reports Independent with ADLs and shares IADLs with mother        OT Problem List:        OT Treatment/Interventions:      OT Goals(Current goals can be found in the care plan section) Acute Rehab OT Goals Patient Stated Goal: go home soon OT Goal Formulation: All assessment and education complete, DC therapy  OT Frequency:     Barriers to D/C:            Co-evaluation              AM-PAC OT "6 Clicks" Daily Activity     Outcome Measure Help from another person eating meals?: None Help from another person taking care of personal grooming?: None Help from another person toileting, which includes using toliet, bedpan, or urinal?: None Help from another person bathing (including washing, rinsing,  drying)?: None Help from another person to put on and taking off regular upper body clothing?: None Help from another person to put on and taking off regular lower body clothing?: None 6 Click Score: 24   End of Session Equipment Utilized During Treatment: Rolling walker Nurse Communication: Mobility status  Activity Tolerance: Patient tolerated treatment well Patient left: in bed;with call bell/phone within reach  OT Visit Diagnosis: Other abnormalities of gait and mobility (R26.89)                Time: 7741-2878 OT Time Calculation (min): 19 min Charges:  OT General Charges $OT Visit: 1 Visit OT Evaluation $OT Eval Low Complexity: 1 Low  Bradd Canary, OTR/L Acute Rehab Services Office: [phone]   Lorre Munroe 09/29/2020, 10:14 AM

## 2020-09-29 NOTE — Evaluation (Addendum)
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 Pt underwent removal of R tibial hardward on 9/16. PMH: R tibial fx with ORIF, Hep C, endocarditis, heroin abuse, HTN, MRSA  Clinical Impression  PTA, patient lives with mother and reports independence. Patient is now WBAT on R LE, however treats as NWB during session. Encouraged weightbearing through R LE during ambulation. Patient overall modI for mobiltiy with RW. Educated patient on increasing weightbearing within tolerance and working on ROM of R ankle. No further skilled PT needs required acutely. Recommend OPPT following discharge to address strength, balance, and ROM deficits.      Recommendations for follow up therapy are one component of a multi-disciplinary discharge planning process, led by the attending physician.  Recommendations may be updated based on patient status, additional functional criteria and insurance authorization.  Follow Up Recommendations Outpatient PT    Equipment Recommendations  None recommended by PT    Recommendations for Other Services       Precautions / Restrictions Precautions Precautions: Fall;Other (comment) Precaution Comments: R tibial wound vac Restrictions Weight Bearing Restrictions: Yes RLE Weight Bearing: Weight bearing as tolerated      Mobility  Bed Mobility Overal bed mobility: Modified Independent                  Transfers Overall transfer level: Modified independent Equipment used: Rolling Amber [name] (2 wheeled);None             General transfer comment: Stood from EOB without AD prior to therapist handing RW over to her. Patient treating R LE as NWB. Encouraged WBing  Ambulation/Gait Ambulation/Gait assistance: Modified independent (Device/Increase  time) Gait Distance (Feet): 20 Feet Assistive device: Rolling Amber [name] (2 wheeled) Gait Pattern/deviations: Step-to pattern Gait velocity: decreased   General Gait Details: treating R LE as NWB, encouraging weight bearing through R LE with mobility. No LOB and no physical assistance needed.  Stairs            Wheelchair Mobility    Modified Rankin (Stroke Patients Only)       Balance Overall balance assessment: Modified Independent Sitting-balance support: No upper extremity supported;Feet supported Sitting balance-Amber [name]: Normal     Standing balance support: During functional activity;No upper extremity supported;Bilateral upper extremity supported Standing balance-Amber [name]: Fair Standing balance comment: fair static standing, reliant on BUE support for mobility due to self NWB                             Pertinent Vitals/Pain Pain Assessment: No/denies pain Faces Pain [name]: No hurt Pain Intervention(s): Monitored during session    Home Living Family/patient expects to be discharged to:: Private residence Living Arrangements: Parent Available Help at Discharge: Family;Available 24 hours/day Type of Home: House Home Access: Stairs to enter   [employer] of Steps: 1 threshold Home Layout: One level Home Equipment: Shower seat - built in;Amber [name] - 2 wheels;Shower seat      Prior Function Level of Independence: Independent         Comments: lives with mother, reports Independent with ADLs and shares IADLs with mother     Hand Dominance   Dominant Hand: Right    Extremity/Trunk Assessment   Upper Extremity Assessment Upper Extremity Assessment: Defer to OT evaluation  Lower Extremity Assessment Lower Extremity Assessment: RLE deficits/detail RLE Deficits / Details: patient able to lift R LE off bed but unable to actively move ankle due to swelling, wrap and pain RLE: Unable to fully assess due to pain    Cervical /  Trunk Assessment Cervical / Trunk Assessment: Normal  Communication   Communication: No difficulties  Cognition Arousal/Alertness: Awake/alert Behavior During Therapy: WFL for tasks assessed/performed Overall Cognitive Status: Within Functional Limits for tasks assessed                                        General Comments      Exercises     Assessment/Plan    PT Assessment All further PT needs can be met in the next venue of care  PT Problem List Decreased strength;Decreased activity tolerance       PT Treatment Interventions      PT Goals (Current goals can be found in the Care Plan section)  Acute Rehab PT Goals Patient Stated Goal: go home soon PT Goal Formulation: With patient    Frequency     Barriers to discharge        Co-evaluation               AM-PAC PT "6 Clicks" Mobility  Outcome Measure Help needed turning from your back to your side while in a flat bed without using bedrails?: None Help needed moving from lying on your back to sitting on the side of a flat bed without using bedrails?: None Help needed moving to and from a bed to a chair (including a wheelchair)?: None Help needed standing up from a chair using your arms (e.g., wheelchair or bedside chair)?: None Help needed to walk in hospital room?: None Help needed climbing 3-5 steps with a railing? : None 6 Click Score: 24    End of Session   Activity Tolerance: Patient tolerated treatment well Patient left: in bed;with call bell/phone within reach Nurse Communication: Mobility status PT Visit Diagnosis: Muscle weakness (generalized) (M62.81)    Time: 1610-9604 PT Time Calculation (min) (ACUTE ONLY): 12 min   Charges:   PT Evaluation $PT Eval Low Complexity: 1 Low          Amber [name] A. Amber [name] PT, DPT Acute Rehabilitation Services Pager [phone] Office [phone]   Amber [name] 09/29/2020, 12:00 PM

## 2020-09-30 NOTE — Progress Notes (Signed)
Patient ID: Amber [name], female   DOB: 08-Sep-1990, 30 y.o.   MRN: [id number] Patient is status post removal of deep abscess and infected hardware with placement of antibiotic beads.  The cultures are still negative.  Wound VAC has no drainage with a good suction fit.  Continue inpatient admission until cultures are finalized.

## 2020-10-01 ENCOUNTER — Encounter (HOSPITAL_COMMUNITY): Payer: Self-pay | Admitting: Orthopedic Surgery

## 2020-10-01 ENCOUNTER — Other Ambulatory Visit (HOSPITAL_COMMUNITY): Payer: Self-pay

## 2020-10-01 LAB — VANCOMYCIN, TROUGH: Vancomycin Tr: 4 ug/mL — ABNORMAL LOW (ref 15–20)

## 2020-10-01 LAB — VANCOMYCIN, PEAK: Vancomycin Pk: 40 ug/mL (ref 30–40)

## 2020-10-01 MED ORDER — ORITAVANCIN DIPHOSPHATE 400 MG IV SOLR
1200.0000 mg | Freq: Once | INTRAVENOUS | Status: AC
  Administered 2020-10-02: 1200 mg via INTRAVENOUS
  Filled 2020-10-01: qty 120

## 2020-10-01 NOTE — Progress Notes (Signed)
Pharmacy Antibiotic Note  Amber [name] is a 30 y.o. female admitted on 09/28/2020 with infected proximal right tibia status post open reduction internal fixation with chronic draining sinus tract. S/p removal of deep retained hardware on 9/16. Pharmacy has been consulted for vancomycin/cefepime dosing for osteomyelitis.   SCr 0.84 (last 9/17). Surgical wound culture positive for MRSA. Current regimen of vancomycin 1000mg  Q24H resulted in subtherapeutic AUC of 388 with VancP 40 mcg/mL and VancT 4 mcg/mL.  Plan: - per ID recommendations, patient to be discharged 9/20 AM on either dalbavancin or oritavancin (vancomycin/cefepime will be discontinued). Due to this, no changes will be made at this time (next dose vancomcyin would be after planned discharge) - If patient remains IP, consider increasing vancomycin to 1250mg  IV Q24H.  Goal AUC 400-550. Expected AUC: 484 ; VancP @ 40, VancT @ 4 - Monitor clinical progress, c/s, renal function - F/u plan/LOT, and transition to dalbavancin/oritavancin prior to discharge   Height: 5\' 1"  (154.9 cm) Weight: 46.3 kg (102 lb) IBW/kg (Calculated) : 47.8  Temp (24hrs), Avg:98.7 F (37.1 C), Min:98.6 F (37 C), Max:98.8 F (37.1 C)  Recent Labs  Lab 09/25/20 0637 09/28/20 1232 09/29/20 0046 10/01/20 1139 10/01/20 1545  WBC 4.0  --   --   --   --   CREATININE 0.72 0.72 0.84  --   --   VANCOTROUGH  --   --   --  4*  --   VANCOPEAK  --   --   --   --  40    Estimated Creatinine Clearance: 71.6 mL/min (by C-G formula based on SCr of 0.84 mg/dL).    Allergies  Allergen Reactions   Bee Venom Anaphylaxis   Other Anaphylaxis    Bee sting    Antimicrobials this admission: 9/16 vancomycin >>  9/16 cefepime >>   Dose adjustments this admission: 9/20 vancomycin 1000mg  Q24H > 1250mg  Q24H if remaining inpatient  Microbiology results: 9/16 R leg abscess: MRSA   Thank you for allowing pharmacy to be a part of this patient's care.  10/16, PharmD Clinical Pharmacist

## 2020-10-01 NOTE — Progress Notes (Addendum)
[facility]  Date of Admission:  09/28/2020     Total days of antibiotics 4         ASSESSMENT:  Amber [name]'s cultures are positive for MRSA. Clinically stable and appears to be tolerating vancomycin with no evidence of nephrotoxicity. Discussed plan of care to continue with vancomycin through day of discharge and then administer 1 dose of oritivancin/delbavancin followed in 10 days by a second dose of medication as an outpatient.  Continue wound care per orthopedics. Has follow up scheduled with Dr. Elinor [name]. Ok for discharge from ID standpoint.   PLAN:  Continue vancomycin Prior to discharge 1 dose of delbavancin or oritivancin then second dose in 10 days as outpatient.  Continue wound care per orthopedics. Follow up with Dr. Elinor [name] as planned. Ok for discharge from ID standpoint.    ------------------ Addendum  Patient s/p hardware removal for chronic mrsa osteomyelitis. 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 Has concern about a piece of screw that was left.   Allergies  Allergen Reactions   Bee Venom Anaphylaxis   Other Anaphylaxis    Bee sting     Review of Systems: Review of Systems  Constitutional:  Negative for chills, fever and weight loss.  Respiratory:  Negative for cough, shortness of  breath and wheezing.   Cardiovascular:  Negative for chest pain and leg swelling.  Gastrointestinal:  Negative for abdominal pain, constipation, diarrhea, nausea and vomiting.  Skin:  Negative for rash.     OBJECTIVE: Vitals:   09/30/20 1643 09/30/20 1940 10/01/20 0450 10/01/20 1005  BP: 118/87 125/87 126/84 116/78  Pulse: 63 64 72 65  Resp: 18 16 17 18   Temp: 98.3 F (36.8 C) 98.7 F (37.1 C) 98.8 F (37.1 C) 98.6 F (37 C)  TempSrc: Oral Oral Oral Oral  SpO2: 98% 100% 98% 97%  Weight:      Height:       Body mass index is 19.27 kg/m.  Physical Exam Constitutional:      General: She is not in acute distress.    Appearance: She is well-developed.  Cardiovascular:     Rate and Rhythm: Normal rate and regular rhythm.     Heart sounds: Normal heart sounds.  Pulmonary:     Effort: Pulmonary effort is normal.     Breath sounds: Normal breath sounds.  Musculoskeletal:     Comments: Wound VAC in place.   Skin:    General: Skin is warm and dry.  Neurological:     Mental Status: She is alert and oriented to person, place, and time.  Psychiatric:        Behavior: Behavior normal.        Thought Content: Thought content normal.        Judgment: Judgment normal.  Lab Results Lab Results  Component Value Date   WBC 4.0 09/25/2020   HGB 13.6 09/25/2020   HCT 40.0 09/25/2020   MCV 84.8 09/25/2020   PLT 170 09/25/2020    Lab Results  Component Value Date   CREATININE 0.84 09/29/2020   BUN 16 09/29/2020   NA 135 09/29/2020   K 4.1 09/29/2020   CL 101 09/29/2020   CO2 24 09/29/2020    Lab Results  Component Value Date   ALT 10 07/13/2020   AST 19 07/13/2020   GGT 118 (H) 01/26/2020   ALKPHOS 122 12/16/2019   BILITOT 0.8 07/13/2020     Microbiology: Recent Results (from the past 240 hour(s))  SARS Coronavirus 2 by RT PCR (hospital order, performed in [facility] hospital lab) Nasopharyngeal Nasopharyngeal Swab     Status: None   Collection Time: 09/28/20   6:20 AM   Specimen: Nasopharyngeal Swab  Result Value Ref Range Status   SARS Coronavirus 2 NEGATIVE NEGATIVE Final    Comment: (NOTE) SARS-CoV-2 target nucleic acids are NOT DETECTED.  The SARS-CoV-2 RNA is generally detectable in upper and lower respiratory specimens during the acute phase of infection. The lowest concentration of SARS-CoV-2 viral copies this assay can detect is 250 copies / mL. A negative result does not preclude SARS-CoV-2 infection and should not be used as the sole basis for treatment or other patient management decisions.  A negative result may occur with improper specimen collection / handling, submission of specimen other than nasopharyngeal swab, presence of viral mutation(s) within the areas targeted by this assay, and inadequate number of viral copies (<250 copies / mL). A negative result must be combined with clinical observations, patient history, and epidemiological information.  Fact Sheet for Patients:   BoilerBrush.com.cy  Fact Sheet for Healthcare Providers: https://pope.com/  This test is not yet approved or  cleared by the [place] FDA and has been authorized for detection and/or diagnosis of SARS-CoV-2 by FDA under an Emergency Use Authorization (EUA).  This EUA will remain in effect (meaning this test can be used) for the duration of the COVID-19 declaration under Section 564(b)(1) of the Act, 21 U.S.C. section 360bbb-3(b)(1), unless the authorization is terminated or revoked sooner.  Performed at [facility], [address], [place], [place] [address]   Aerobic/Anaerobic Culture w Gram Stain (surgical/deep wound)     Status: None (Preliminary result)   Collection Time: 09/28/20  9:01 AM   Specimen: PATH Soft tissue  Result Value Ref Range Status   Specimen Description TISSUE  Final   Special Requests RIGHT LEG ABCESS SPEC A  Final   Gram Stain   Final    NO WBC SEEN NO  ORGANISMS SEEN Performed at [facility], [address], [place], [place] [address]    Culture   Final    RARE METHICILLIN RESISTANT STAPHYLOCOCCUS AUREUS NO ANAEROBES ISOLATED; CULTURE IN PROGRESS FOR 5 DAYS    Report Status PENDING  Incomplete   Organism ID, Bacteria METHICILLIN RESISTANT STAPHYLOCOCCUS AUREUS  Final      Susceptibility   Methicillin resistant staphylococcus aureus - MIC*    CIPROFLOXACIN >=8 RESISTANT Resistant     ERYTHROMYCIN >=8 RESISTANT Resistant     GENTAMICIN <=0.5 SENSITIVE Sensitive     OXACILLIN >=4 RESISTANT Resistant     TETRACYCLINE <=1 SENSITIVE Sensitive     VANCOMYCIN <=0.5 SENSITIVE Sensitive     TRIMETH/SULFA 160 RESISTANT Resistant     CLINDAMYCIN >=8 RESISTANT  Resistant     RIFAMPIN <=0.5 SENSITIVE Sensitive     Inducible Clindamycin NEGATIVE Sensitive     * RARE METHICILLIN RESISTANT STAPHYLOCOCCUS AUREUS     Marcos Eke, NP [facility]  10/01/2020  1:05 PM

## 2020-10-01 NOTE — Progress Notes (Signed)
Patient status post removal of infected hardware she is comfortable lying in bed.  Appropriate to conversation.  Cultures are growing staph aureus but no sensitivities yet   .  Plan for discharge tomorrow hopefully sensitivities acquired today

## 2020-10-01 NOTE — TOC Initial Note (Signed)
Transition of Care [facility]) - Initial/Assessment Note    Patient Details  Name: Amber [name] MRN: [id number] Date of Birth: [birth year]  [facility] CM/SW Contact:    Kingsley Plan, RN Phone Number: 10/01/2020, 4:03 PM  Clinical Narrative:                 PAtient from home, lives with her mother.   Discussed OP PT, patient choice [facility], order entered in Epic, information on AVS.   Confirmed patient has PCP.   PAtient does not have insurance. Discussed MATCH and TOC pharmacy . Patient can afford $3 co pay per prescription.   Patient entered in [facility]   Expected Discharge Plan: Home/Self Care     Patient Goals and CMS Choice Patient states their goals for this hospitalization and ongoing recovery are:: to return home CMS Medicare.gov Compare Post Acute Care list provided to:: Patient Choice offered to / list presented to : Patient  Expected Discharge Plan and Services Expected Discharge Plan: Home/Self Care   Discharge Planning Services: CM Consult, MATCH Program, Medication Assistance Post Acute Care Choice:  (OP PT) Living arrangements for the past 2 months: Single Family Home                 DME Arranged: N/A         HH Arranged: NA          Prior Living Arrangements/Services Living arrangements for the past 2 months: Single Family Home   Patient language and need for interpreter reviewed:: Yes Do you feel safe going back to the place where you live?: Yes      Need for Family Participation in Patient Care: Yes (Comment) Care giver support system in place?: Yes (comment)   Criminal Activity/Legal Involvement Pertinent to Current Situation/Hospitalization: No - Comment as needed  Activities of Daily Living Home Assistive Devices/Equipment: Scales ADL Screening (condition at time of admission) Patient's cognitive ability adequate to safely complete daily activities?: Yes Is the patient deaf or have difficulty hearing?: No Does the  patient have difficulty seeing, even when wearing glasses/contacts?: No Does the patient have difficulty concentrating, remembering, or making decisions?: No Patient able to express need for assistance with ADLs?: Yes Does the patient have difficulty dressing or bathing?: No Independently performs ADLs?: Yes (appropriate for developmental age) Does the patient have difficulty walking or climbing stairs?: No Weakness of Legs: None Weakness of Arms/Hands: None  Permission Sought/Granted   Permission granted to share information with : No              Emotional Assessment Appearance:: Appears stated age Attitude/Demeanor/Rapport: Engaged Affect (typically observed): Accepting Orientation: : Oriented to Self, Oriented to Place, Oriented to  Time, Oriented to Situation Alcohol / Substance Use: Not Applicable Psych Involvement: No (comment)  Admission diagnosis:  Abscess of right leg [L02.415] Patient Active Problem List   Diagnosis Date Noted   Abscess of right leg 09/28/2020   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, sequela    Chronic hepatitis C without hepatic coma (HCC) 09/26/2020   Medication monitoring encounter 09/26/2020   Immunization counseling 01/26/2020   IV infiltrate, subsequent encounter 01/16/2020   Septic pulmonary embolism without acute cor pulmonale (HCC) 01/16/2020   Hydropneumothorax 01/16/2020   Severe tricuspid regurgitation 01/16/2020   GAD (generalized anxiety disorder) 01/16/2020   Hepatitis C, chronic (HCC) 01/16/2020   Anemia, chronic disease 01/16/2020   Protein-calorie malnutrition, severe 12/27/2019   MRSA infection 12/06/2019   Opioid use  with withdrawal (HCC)    Endocarditis of tricuspid valve    Palliative care encounter    Septic shock (HCC) 11/30/2019   Septic embolism (HCC) 11/05/2019   AKI (acute kidney injury) (HCC) 11/05/2019   Hyponatremia 11/05/2019   Cellulitis of left foot 11/05/2019   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   Bacterial  endocarditis 11/05/2019   Acute bacterial endocarditis    MRSA bacteremia    Sepsis (HCC) 11/03/2019   Abscess of face 08/20/2018   Facial cellulitis 11/21/2017   Depression    IV drug abuse (HCC)    Suicidal ideation    PCP:  [facility] Pharm[facility]:   [facility] [employer] - [place], [place] - [address] [place] [address] Phone: [phone] Fax: [phone]  [facility] [address] [place] [address] Phone: [phone] Fax: [phone]     Social Determinants of Health (SDOH) Interventions    Readmission Risk Interventions Readmission Risk Prevention Plan 01/16/2020  Transportation Screening Complete  PCP or Specialist Appt within 3-5 Days Complete  HRI or Home Care Consult Complete  Social Work Consult for Recovery Care Planning/Counseling Complete  Palliative Care Screening Complete  Medication Review Oceanographer) Complete  Some recent data might be hidden

## 2020-10-02 ENCOUNTER — Other Ambulatory Visit (HOSPITAL_COMMUNITY): Payer: Self-pay

## 2020-10-02 ENCOUNTER — Encounter: Payer: Self-pay | Admitting: Orthopedic Surgery

## 2020-10-02 ENCOUNTER — Telehealth: Payer: Self-pay | Admitting: Orthopedic Surgery

## 2020-10-02 ENCOUNTER — Other Ambulatory Visit: Payer: Self-pay | Admitting: Physician Assistant

## 2020-10-02 MED ORDER — OXYCODONE HCL 5 MG PO TABS
5.0000 mg | ORAL_TABLET | ORAL | 0 refills | Status: DC | PRN
  Filled 2020-10-02: qty 20, 3d supply, fill #0

## 2020-10-02 MED ORDER — IBUPROFEN 600 MG PO TABS: 600.0000 mg | ORAL_TABLET | Freq: Four times a day (QID) | ORAL | 0 refills | Status: DC | PRN

## 2020-10-02 NOTE — Progress Notes (Signed)
Amber [name] to be D/C'd  per MD order.  Discussed with the patient and all questions fully answered.  VSS, Skin clean, dry and intact without evidence of skin break down, no evidence of skin tears noted.  IV catheter discontinued intact. Site without signs and symptoms of complications. Dressing and pressure applied.  An After Visit Summary was printed and given to the patient and mother. TOC delivery her prescription but patient is taking suboxone, mother stated patient can not take the oxycodone while taking the suboxone. Amber [name] (mother) called Dr.Duda office to get ibuprofen 600 mg order.  D/c re-education completed with patient/family including follow up instructions, medication list, d/c activities limitations if indicated, with other d/c instructions as indicated by MD - patient able to verbalize understanding, all questions fully answered.   Patient instructed to return to ED, call 911, or call MD for any changes in condition.   Patient to be escorted via WC, and D/C home via private auto.

## 2020-10-02 NOTE — Discharge Summary (Signed)
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 female who was admitted 09/28/2020 with a chief complaint of painful hardwar right tibia, with a final diagnosis of Osteomyelitis and Hardware Infection Right Tibia.  Patient was brought to the operating room on 09/28/2020 and underwent Procedure(s): REMOVAL DEEP HARDWARE RIGHT TIBIA.    Patient was given perioperative antibiotics:  Anti-infectives (From admission, onward)    Start     Dose/Rate Route Frequency Ordered Stop   10/02/20 1000  Oritavancin Diphosphate (ORBACTIV) 1,200 mg in dextrose 5 % IVPB        1,200 mg 333.3 mL/hr over 180 Minutes Intravenous Once 10/01/20 1501 10/02/20 1253   09/29/20 1400  vancomycin (VANCOCIN) IVPB 1000 mg/200 mL premix  Status:  Discontinued        1,000 mg 200 mL/hr over 60 Minutes Intravenous Every 24 hours 09/28/20 1216 10/02/20 0853   09/29/20 0930  ceFEPIme (MAXIPIME) 2 g in sodium chloride 0.9 % 100 mL IVPB  Status:  Discontinued        2 g 200 mL/hr over 30 Minutes Intravenous Every 8 hours 09/29/20 0834 10/01/20 0904   09/28/20 1400  vancomycin (VANCOCIN) IVPB 1000 mg/200 mL premix        1,000 mg 200 mL/hr over 60 Minutes Intravenous  Once 09/28/20 1114 09/28/20 1728   09/28/20 1315  ceFEPIme (MAXIPIME) 2 g in sodium chloride 0.9 % 100 mL IVPB  Status:  Discontinued        2 g 200 mL/hr over 30 Minutes Intravenous Every 8 hours 09/28/20 1216 09/28/20 1425   09/28/20 0840  vancomycin (VANCOCIN) powder  Status:  Discontinued          As needed 09/28/20 0840 09/28/20 0942   09/28/20 0839  gentamicin (GARAMYCIN) injection  Status:  Discontinued          As needed 09/28/20 0839 09/28/20 0942   09/28/20 0645  ceFAZolin (ANCEF) IVPB 2g/100 mL premix        2 g 200  mL/hr over 30 Minutes Intravenous On call to O.R. 09/28/20 0630 09/28/20 0921   09/28/20 0634  ceFAZolin (ANCEF) 2-4 GM/100ML-% IVPB       Note to Pharmacy: Jamelle Rushing, GRETA   : cabinet override      09/28/20 0634 09/28/20 0903     .  Patient was given sequential compression devices, early ambulation, and aspirin for DVT prophylaxis.  Recent vital signs: Patient Vitals for the past 24 hrs:  BP Temp Temp src Pulse Resp SpO2  10/02/20 0804 112/75 98.1 F (36.7 C) Oral 63 17 98 %  10/02/20 0500 104/70 97.8 F (36.6 C) Oral 64 18 99 %  10/01/20 2054 (!) 126/92 98.4 F (36.9 C) Oral 81 18 99 %  .  Recent laboratory studies: No results found.  Discharge Medications:   Allergies as of 10/02/2020       Reactions   Bee Venom Anaphylaxis   Other Anaphylaxis   Bee sting        Medication List     STOP taking these medications    doxycycline 100 MG capsule Commonly known as: MONODOX       TAKE these medications    acetaminophen 500 MG tablet Commonly known as: TYLENOL Take 1,000  mg by mouth every 6 (six) hours as needed for headache.   buprenorphine-naloxone 8-2 mg Subl SL tablet Commonly known as: SUBOXONE Place 2 tablets under the tongue daily.   diclofenac Sodium 1 % Gel Commonly known as: VOLTAREN Apply 4 g topically 4 (four) times daily. What changed:  when to take this reasons to take this   escitalopram 20 MG tablet Commonly known as: LEXAPRO Take 1 tablet (20 mg total) by mouth at bedtime.   gabapentin 600 MG tablet Commonly known as: NEURONTIN Take 0.5 tablets (300 mg total) by mouth 3 (three) times daily. What changed:  how much to take when to take this   ibuprofen 600 MG tablet Commonly known as: ADVIL Take 1 tablet (600 mg total) by mouth every 6 (six) hours as needed.   lidocaine 5 % Commonly known as: LIDODERM Place 2 patches onto the skin daily as needed (pain). Remove & Discard patch within 12 hours or as directed by MD   mirtazapine 15  MG tablet Commonly known as: REMERON Take 15 mg by mouth at bedtime as needed (sleep).   multivitamin with minerals Tabs tablet Take 1 tablet by mouth daily.   mupirocin ointment 2 % Commonly known as: BACTROBAN Apply 1 application topically daily.   OVER THE COUNTER MEDICATION Apply 1 application topically daily at 2 am. silver alginate mepilex   torsemide 10 MG tablet Commonly known as: DEMADEX Take 1 tablet (10 mg total) by mouth daily as needed (AS NEEDED FOR SHORTNESS OF BREATH OR SWELLING).        Diagnostic Studies: CARDIAC CATHETERIZATION  Result Date: 09/25/2020 1. Normal PA pressure. 2. Normal PCWP 3. RA pressure normal but there are prominent V-waves suggestive of significant tricuspid regurgitation. This study is suggestive of well-compensated tricuspid regurgitation.    Patient benefited maximally from their hospital stay and there were no complications.     Disposition: Discharge disposition: 01-Home or Self Care      Discharge Instructions     Ambulatory referral to Physical Therapy   Complete by: As directed    Iontophoresis - 4 mg/ml of dexamethasone: No   T.E.N.S. Unit Evaluation and Dispense as Indicated: No   Call MD / Call 911   Complete by: As directed    If you experience chest pain or shortness of breath, CALL 911 and be transported to the hospital emergency room.  If you develope a fever above 101 F, pus (white drainage) or increased drainage or redness at the wound, or calf pain, call your surgeon's office.   Constipation Prevention   Complete by: As directed    Drink plenty of fluids.  Prune juice may be helpful.  You may use a stool softener, such as Colace (over the counter) 100 mg twice a day.  Use MiraLax (over the counter) for constipation as needed.   Diet - low sodium heart healthy   Complete by: As directed    Discharge instructions   Complete by: As directed    Keep dressing dry.   Increase activity slowly as tolerated   Complete  by: As directed    Negative Pressure Wound Therapy - Incisional   Complete by: As directed    Show patient how to attach vac. Should call office if alarms   Negative Pressure Wound Therapy - Incisional   Complete by: As directed    Show patient how to attach preveena vac   Post-operative opioid taper instructions:   Complete by: As directed  POST-OPERATIVE OPIOID TAPER INSTRUCTIONS: It is important to wean off of your opioid medication as soon as possible. If you do not need pain medication after your surgery it is ok to stop day one. Opioids include: Codeine, Hydrocodone(Norco, Vicodin), Oxycodone(Percocet, oxycontin) and hydromorphone amongst others.  Long term and even short term use of opiods can cause: Increased pain response Dependence Constipation Depression Respiratory depression And more.  Withdrawal symptoms can include Flu like symptoms Nausea, vomiting And more Techniques to manage these symptoms Hydrate well Eat regular healthy meals Stay active Use relaxation techniques(deep breathing, meditating, yoga) Do Not substitute Alcohol to help with tapering If you have been on opioids for less than two weeks and do not have pain than it is ok to stop all together.  Plan to wean off of opioids This plan should start within one week post op of your joint replacement. Maintain the same interval or time between taking each dose and first decrease the dose.  Cut the total daily intake of opioids by one tablet each day Next start to increase the time between doses. The last dose that should be eliminated is the evening dose.          Follow-up Information     Nadara Mustard, MD Follow up in 1 week(s).   Specialty: Orthopedic Surgery Contact information: [address] [place] [address] [phone]         [facility] Follow up.   Why: [address], [address] [place] , [place] [address]   phone [phone]         Odette Fraction, MD Follow up.   Specialty: Infectious Diseases Why: 10/10/20 at 2:30pm with Dr. Elinor Parkinson. Please call to reschedule if you unable to make this appointment. Contact information: [address] [employer] [address] [place] [address] [phone]                  Signed: [place] Jonah Nestle 10/02/2020, 3:46 PM

## 2020-10-02 NOTE — Telephone Encounter (Signed)
Patient's mother Altamese Cabal called advised patient is being discharges from the hospital and can not take the Oxycodone because it interact with Suboxene. Merry asked if a Rx for Ibuprofen can be sent to the pharmacy at [facility] in [place]. The pharmacy phone number id [phone]

## 2020-10-02 NOTE — Discharge Summary (Signed)
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 female who was admitted 09/28/2020 with a chief complaint of right leg pain, with a final diagnosis of Osteomyelitis and Hardware Infection Right Tibia.  Patient was brought to the operating room on 09/28/2020 and underwent Procedure(s): REMOVAL DEEP HARDWARE RIGHT TIBIA.    Patient was given perioperative antibiotics:  Anti-infectives (From admission, onward)    Start     Dose/Rate Route Frequency Ordered Stop   10/02/20 1000  Oritavancin Diphosphate (ORBACTIV) 1,200 mg in dextrose 5 % IVPB        1,200 mg 333.3 mL/hr over 180 Minutes Intravenous Once 10/01/20 1501     09/29/20 1400  vancomycin (VANCOCIN) IVPB 1000 mg/200 mL premix  Status:  Discontinued        1,000 mg 200 mL/hr over 60 Minutes Intravenous Every 24 hours 09/28/20 1216 10/02/20 0853   09/29/20 0930  ceFEPIme (MAXIPIME) 2 g in sodium chloride 0.9 % 100 mL IVPB  Status:  Discontinued        2 g 200 mL/hr over 30 Minutes Intravenous Every 8 hours 09/29/20 0834 10/01/20 0904   09/28/20 1400  vancomycin (VANCOCIN) IVPB 1000 mg/200 mL premix        1,000 mg 200 mL/hr over 60 Minutes Intravenous  Once 09/28/20 1114 09/28/20 1728   09/28/20 1315  ceFEPIme (MAXIPIME) 2 g in sodium chloride 0.9 % 100 mL IVPB  Status:  Discontinued        2 g 200 mL/hr over 30 Minutes Intravenous Every 8 hours 09/28/20 1216 09/28/20 1425   09/28/20 0840  vancomycin (VANCOCIN) powder  Status:  Discontinued          As needed 09/28/20 0840 09/28/20 0942   09/28/20 0839  gentamicin (GARAMYCIN) injection  Status:  Discontinued          As needed 09/28/20 0839 09/28/20 0942   09/28/20 0645  ceFAZolin (ANCEF) IVPB 2g/100 mL premix        2 g 200 mL/hr over 30 Minutes  Intravenous On call to O.R. 09/28/20 0630 09/28/20 0921   09/28/20 0634  ceFAZolin (ANCEF) 2-4 GM/100ML-% IVPB       Note to Pharmacy: Jamelle Rushing, GRETA   : cabinet override      09/28/20 0634 09/28/20 0903     .  Patient was given sequential compression devices, early ambulation, and aspirin for DVT prophylaxis.  Recent vital signs: Patient Vitals for the past 24 hrs:  BP Temp Temp src Pulse Resp SpO2  10/02/20 0804 112/75 98.1 F (36.7 C) Oral 63 17 98 %  10/02/20 0500 104/70 97.8 F (36.6 C) Oral 64 18 99 %  10/01/20 2054 (!) 126/92 98.4 F (36.9 C) Oral 81 18 99 %  .  Recent laboratory studies: No results found.  Discharge Medications:   Allergies as of 10/02/2020       Reactions   Bee Venom Anaphylaxis   Other Anaphylaxis   Bee sting        Medication List     STOP taking these medications    doxycycline 100 MG capsule Commonly known as: MONODOX       TAKE these medications    acetaminophen 500 MG tablet Commonly known as: TYLENOL Take 1,000 mg  by mouth every 6 (six) hours as needed for headache.   buprenorphine-naloxone 8-2 mg Subl SL tablet Commonly known as: SUBOXONE Place 2 tablets under the tongue daily.   diclofenac Sodium 1 % Gel Commonly known as: VOLTAREN Apply 4 g topically 4 (four) times daily. What changed:  when to take this reasons to take this   escitalopram 20 MG tablet Commonly known as: LEXAPRO Take 1 tablet (20 mg total) by mouth at bedtime.   gabapentin 600 MG tablet Commonly known as: NEURONTIN Take 0.5 tablets (300 mg total) by mouth 3 (three) times daily. What changed:  how much to take when to take this   ibuprofen 600 MG tablet Commonly known as: ADVIL Take 1 tablet (600 mg total) by mouth every 6 (six) hours as needed.   lidocaine 5 % Commonly known as: LIDODERM Place 2 patches onto the skin daily as needed (pain). Remove & Discard patch within 12 hours or as directed by MD   mirtazapine 15 MG tablet Commonly  known as: REMERON Take 15 mg by mouth at bedtime as needed (sleep).   multivitamin with minerals Tabs tablet Take 1 tablet by mouth daily.   mupirocin ointment 2 % Commonly known as: BACTROBAN Apply 1 application topically daily.   OVER THE COUNTER MEDICATION Apply 1 application topically daily at 2 am. silver alginate mepilex   oxyCODONE 5 MG immediate release tablet Commonly known as: Oxy IR/ROXICODONE Take 1-2 tablets (5-10 mg total) by mouth every 4 (four) hours as needed for moderate pain or severe pain.   torsemide 10 MG tablet Commonly known as: DEMADEX Take 1 tablet (10 mg total) by mouth daily as needed (AS NEEDED FOR SHORTNESS OF BREATH OR SWELLING).        Diagnostic Studies: CARDIAC CATHETERIZATION  Result Date: 09/25/2020 1. Normal PA pressure. 2. Normal PCWP 3. RA pressure normal but there are prominent V-waves suggestive of significant tricuspid regurgitation. This study is suggestive of well-compensated tricuspid regurgitation.    Patient benefited maximally from their hospital stay and there were no complications.     Disposition: Discharge disposition: 01-Home or Self Care      Discharge Instructions     Ambulatory referral to Physical Therapy   Complete by: As directed    Iontophoresis - 4 mg/ml of dexamethasone: No   T.E.N.S. Unit Evaluation and Dispense as Indicated: No   Call MD / Call 911   Complete by: As directed    If you experience chest pain or shortness of breath, CALL 911 and be transported to the hospital emergency room.  If you develope a fever above 101 F, pus (white drainage) or increased drainage or redness at the wound, or calf pain, call your surgeon's office.   Constipation Prevention   Complete by: As directed    Drink plenty of fluids.  Prune juice may be helpful.  You may use a stool softener, such as Colace (over the counter) 100 mg twice a day.  Use MiraLax (over the counter) for constipation as needed.   Diet - low sodium  heart healthy   Complete by: As directed    Discharge instructions   Complete by: As directed    Keep dressing dry.   Increase activity slowly as tolerated   Complete by: As directed    Negative Pressure Wound Therapy - Incisional   Complete by: As directed    Show patient how to attach vac. Should call office if alarms   Negative Pressure Wound Therapy -  Incisional   Complete by: As directed    Show patient how to attach preveena vac   Post-operative opioid taper instructions:   Complete by: As directed    POST-OPERATIVE OPIOID TAPER INSTRUCTIONS: It is important to wean off of your opioid medication as soon as possible. If you do not need pain medication after your surgery it is ok to stop day one. Opioids include: Codeine, Hydrocodone(Norco, Vicodin), Oxycodone(Percocet, oxycontin) and hydromorphone amongst others.  Long term and even short term use of opiods can cause: Increased pain response Dependence Constipation Depression Respiratory depression And more.  Withdrawal symptoms can include Flu like symptoms Nausea, vomiting And more Techniques to manage these symptoms Hydrate well Eat regular healthy meals Stay active Use relaxation techniques(deep breathing, meditating, yoga) Do Not substitute Alcohol to help with tapering If you have been on opioids for less than two weeks and do not have pain than it is ok to stop all together.  Plan to wean off of opioids This plan should start within one week post op of your joint replacement. Maintain the same interval or time between taking each dose and first decrease the dose.  Cut the total daily intake of opioids by one tablet each day Next start to increase the time between doses. The last dose that should be eliminated is the evening dose.          Follow-up Information     Nadara Mustard, MD Follow up in 1 week(s).   Specialty: Orthopedic Surgery Contact information: [address] [place]  [address] [phone]         [facility] Follow up.   Why: [address] [place] , [place] [address]   phone [phone]        Odette Fraction, MD Follow up.   Specialty: Infectious Diseases Why: 10/10/20 at 2:30pm with Dr. Elinor Parkinson. Please call to reschedule if you unable to make this appointment. Contact information: [address] [employer] [address] [place] [address] [phone]                  Signed: [place] Ryaan Vanwagoner 10/02/2020, 10:12 AM

## 2020-10-02 NOTE — Telephone Encounter (Signed)
Done

## 2020-10-02 NOTE — Progress Notes (Signed)
Patient ID: Amber [name], female   DOB: [birth year], 30 y.o.   MRN: [id number] Patient is status post removal of infected deep retained hardware and placement of antibiotic beads.  Cultures are positive for MRSA.  Plan for discharge to home today antibiotics per infectious disease.  I will follow-up in the office in 1 week.

## 2020-10-02 NOTE — Progress Notes (Signed)
Office Visit Note   Patient: Amber [name]           Date of Birth: 1990/08/10           MRN: [id number] Visit Date: 09/24/2020              Requested by: Center, [facility] [address] [place],  [place] [address] PCP: [facility], [facility]  Chief Complaint  Patient presents with   Right Leg - Pain    Hx 2013 ORIF tibial plateau fx at [facility]  Infected hardware       HPI: Patient is a 30 year old woman who is seen for initial evaluation and referral from Dr. Leanord Hawking.  Patient underwent open duction internal fixation of a tibial plateau fracture at [facility] in 2013.  Patient has had chronic infection she has a history of tricuspid valve infection and will need the tricuspid valve surgery after the hardware and proximal tibia infection has been resolved.  Patient has a persistent chronic draining sinus tract over the hardware.  Patient has a history of MRSA sepsis  Assessment & Plan: Visit Diagnoses:  1. Hardware complicating wound infection, initial encounter (HCC)   2. 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   Ortho Exam  Patient is alert, oriented, no adenopathy, well-dressed, normal affect, normal respiratory effort. Examination the right lower extremity patient has a chronic draining sinus tract over the surgical incision for the tibial plateau hardware.  The ulcer probes to hardware.  There is no surrounding cellulitis.  Review of the radiographs do not show any destructive bony changes.  Patient is a smoker hemoglobin A1c the last year was 6.3.  Most recent sed rate is 9 with a C-reactive protein of 2.3.  Albumin of 2.0.  Hemoglobin 13.6.  Imaging: No results found. No images are attached to the  encounter.  Labs: Lab Results  Component Value Date   HGBA1C 6.3 (H) 11/04/2019   ESRSEDRATE 9 09/26/2020   CRP 2.3 09/26/2020   REPTSTATUS PENDING 09/28/2020   GRAMSTAIN  09/28/2020    NO WBC SEEN NO ORGANISMS SEEN Performed at [facility], [address], [place], [place] [address]    CULT  09/28/2020    RARE METHICILLIN RESISTANT STAPHYLOCOCCUS AUREUS NO ANAEROBES ISOLATED; CULTURE IN PROGRESS FOR 5 DAYS    LABORGA METHICILLIN RESISTANT STAPHYLOCOCCUS AUREUS 09/28/2020     Lab Results  Component Value Date   ALBUMIN 2.0 (L) 12/16/2019   ALBUMIN 2.0 (L) 12/15/2019   ALBUMIN 2.3 (L) 12/13/2019    Lab Results  Component Value Date   MG 1.7 12/15/2019   MG 1.5 (L) 12/14/2019   MG 1.7 12/12/2019   No results found for: VD25OH  No results found for: PREALBUMIN CBC EXTENDED Latest Ref Rng & Units 09/25/2020 09/25/2020 09/25/2020  WBC 4.0 - 10.5 K/uL - - 4.0  RBC 3.87 - 5.11 MIL/uL - - 5.19(H)  HGB 12.0 - 15.0 g/dL 34.7 42.5 95.6  HCT 38.7 - 46.0 % 40.0 42.0 44.0  PLT 150 - 400 K/uL - - 170  NEUTROABS 1.7 - 7.7 K/uL - - -  LYMPHSABS 0.7 - 4.0 K/uL - - -     There is no height or weight on file  to calculate BMI.  Orders:  No orders of the defined types were placed in this encounter.  No orders of the defined types were placed in this encounter.    Procedures: No procedures performed  Clinical Data: No additional findings.  ROS:  All other systems negative, except as noted in the HPI. Review of Systems  Objective: Vital Signs: There were no vitals taken for this visit.  Specialty Comments:  No specialty comments available.  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, sequela    Chronic hepatitis C without hepatic coma (HCC) 09/26/2020   Medication monitoring encounter 09/26/2020   Immunization counseling 01/26/2020   IV infiltrate, subsequent  encounter 01/16/2020   Septic pulmonary embolism without acute cor pulmonale (HCC) 01/16/2020   Hydropneumothorax 01/16/2020   Severe tricuspid regurgitation 01/16/2020   GAD (generalized anxiety disorder) 01/16/2020   Hepatitis C, chronic (HCC) 01/16/2020   Anemia, chronic disease 01/16/2020   Protein-calorie malnutrition, severe 12/27/2019   MRSA infection 12/06/2019   Opioid use with withdrawal (HCC)    Endocarditis of tricuspid valve    Palliative care encounter    Septic shock (HCC) 11/30/2019   Septic embolism (HCC) 11/05/2019   AKI (acute kidney injury) (HCC) 11/05/2019   Hyponatremia 11/05/2019   Cellulitis of left foot 11/05/2019   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   Bacterial endocarditis 11/05/2019   Acute bacterial endocarditis    MRSA bacteremia    Sepsis (HCC) 11/03/2019   Abscess of face 08/20/2018   Facial cellulitis 11/21/2017   Depression    IV drug abuse (HCC)    Suicidal ideation    Past Medical History:  Diagnosis Date   Complication of anesthesia    Hard to wake up   Endocarditis    Hepatitis    Hep C   Heroin abuse (HCC)    History of kidney stones    Hypertension    MRSA (methicillin resistant staph aureus) culture positive    Ovarian cyst    UTI (lower urinary tract infection)     Family History  Problem Relation Age of Onset   Valvular heart disease Maternal Grandmother     Past Surgical History:  Procedure Laterality Date   APPLICATION OF ANGIOVAC N/A 12/05/2019   Procedure: APPLICATION OF ANGIOVAC;  Surgeon: Corliss Skains, MD;  Location: [facility] OR;  Service: Vascular;  Laterality: N/A;  Patient will need intraoperative TEE Can be performed in OR 14, 15, or 17 if OR 16 is not available   CARDIAC CATHETERIZATION     FINGER SURGERY     HARDWARE REMOVAL Right 09/28/2020   Procedure: REMOVAL DEEP HARDWARE RIGHT TIBIA;  Surgeon: Nadara Mustard, MD;  Location: [facility] OR;  Service: Orthopedics;  Laterality: Right;   RIGHT HEART CATH N/A  09/25/2020   Procedure: RIGHT HEART CATH;  Surgeon: Laurey Morale, MD;  Location: [facility] INVASIVE CV LAB;  Service: Cardiovascular;  Laterality: N/A;   TEE WITHOUT CARDIOVERSION N/A 11/07/2019   Procedure: TRANSESOPHAGEAL ECHOCARDIOGRAM (TEE);  Surgeon: Wendall Stade, MD;  Location: [facility] ENDOSCOPY;  Service: Cardiovascular;  Laterality: N/A;   TEE WITHOUT CARDIOVERSION  12/05/2019   Procedure: TRANSESOPHAGEAL ECHOCARDIOGRAM (TEE);  Surgeon: Corliss Skains, MD;  Location: [facility];  Service: Vascular;;   TEE WITHOUT CARDIOVERSION N/A 12/05/2019   Procedure: TRANSESOPHAGEAL ECHOCARDIOGRAM (TEE);  Surgeon: Corliss Skains, MD;  Location: [facility] OR;  Service: Thoracic;  Laterality: N/A;   Social History   Occupational History   Not on file  Tobacco Use   Smoking status: Every Day    Types: Cigarettes   Smokeless tobacco: Never  Vaping Use   Vaping Use: Never used  Substance and Sexual Activity   Alcohol use: No   Drug use: Yes    Types: Marijuana, IV    Comment: opiods- pain medication/heroin   Sexual activity: Yes    Birth control/protection: None

## 2020-10-03 ENCOUNTER — Other Ambulatory Visit: Payer: Self-pay | Admitting: Physician Assistant

## 2020-10-03 ENCOUNTER — Telehealth: Payer: Self-pay

## 2020-10-03 LAB — AEROBIC/ANAEROBIC CULTURE W GRAM STAIN (SURGICAL/DEEP WOUND): Gram Stain: NONE SEEN

## 2020-10-03 NOTE — Telephone Encounter (Signed)
Patient wanted to know is it normal for more blood to flow through the tube than usual?  Stated no leaks, clotting, vac is working just fine.  Patient had right tibia surgery on 09/28/2020.  Cb# [phone].  Please advise.  Thank you.

## 2020-10-04 ENCOUNTER — Telehealth: Payer: Self-pay

## 2020-10-04 ENCOUNTER — Encounter: Payer: Self-pay | Admitting: Orthopedic Surgery

## 2020-10-04 ENCOUNTER — Encounter (HOSPITAL_COMMUNITY): Payer: Self-pay | Admitting: Internal Medicine

## 2020-10-04 NOTE — Telephone Encounter (Signed)
Patient would like a call back concerning wound Vac.  Stated that vac has one green light on and would like to know what it means or if something could be wrong with the vac.  Cb# [phone].  Please advise.  Thank you.

## 2020-10-04 NOTE — Telephone Encounter (Signed)
Pt has an appt tomorrow at 9 am

## 2020-10-04 NOTE — Telephone Encounter (Signed)
I called pt and advised that the green light is to indicate how may days of life are left on the vac. Pt had surgery 09/28/20 and so the vac should be removed tomorrow. Appt sch for 9am with Denny Peon will remove vac advise on dressing and then she can keep her follow up appt with Dr. Lajoyce Corners for that following Thursday.

## 2020-10-05 ENCOUNTER — Ambulatory Visit (INDEPENDENT_AMBULATORY_CARE_PROVIDER_SITE_OTHER): Payer: Self-pay | Admitting: Family

## 2020-10-05 ENCOUNTER — Encounter: Payer: Self-pay | Admitting: Family

## 2020-10-05 DIAGNOSIS — T847XXS Infection and inflammatory reaction due to other internal orthopedic prosthetic devices, implants and grafts, sequela: Secondary | ICD-10-CM

## 2020-10-05 NOTE — Progress Notes (Signed)
Post-Op Visit Note   Patient: Amber [name]           Date of Birth: Nov 23, 1990           MRN: [id number] Visit Date: 10/05/2020 PCP: [facility]  Chief Complaint: No chief complaint on file.   HPI:  HPI The patient is a 30 year old woman seen 1 week status post I&D of right tibia.  Wound VAC removed today.  Ortho Exam On examination of the right lower extremity incision is approximated well with sutures and staples.  There is no gaping no drainage no signs of infection no erythema Visit Diagnoses: No diagnosis found.  Plan: Begin daily Dial soap cleansing.  Dry dressing changes.  Elevate for swelling.  Follow-up in 1 week for suture removal.  Follow-Up Instructions: No follow-ups on file.   Imaging: No results found.  Orders:  No orders of the defined types were placed in this encounter.  No orders of the defined types were placed in this encounter.    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, sequela    Chronic hepatitis C without hepatic coma (HCC) 09/26/2020   Medication monitoring encounter 09/26/2020   Immunization counseling 01/26/2020   IV infiltrate, subsequent encounter 01/16/2020   Septic pulmonary embolism without acute cor pulmonale (HCC) 01/16/2020   Hydropneumothorax 01/16/2020   Severe tricuspid regurgitation 01/16/2020   GAD (generalized anxiety disorder) 01/16/2020   Hepatitis C, chronic (HCC) 01/16/2020   Anemia, chronic disease 01/16/2020   Protein-calorie malnutrition, severe 12/27/2019   MRSA infection 12/06/2019   Opioid use with withdrawal (HCC)    Endocarditis of tricuspid valve    Palliative care encounter    Septic shock (HCC) 11/30/2019   Septic embolism (HCC) 11/05/2019   AKI (acute kidney injury) (HCC) 11/05/2019   Hyponatremia 11/05/2019   Cellulitis of left foot 11/05/2019   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    Bacterial endocarditis 11/05/2019   Acute bacterial endocarditis    MRSA bacteremia    Sepsis (HCC) 11/03/2019   Abscess of face 08/20/2018   Facial cellulitis 11/21/2017   Depression    IV drug abuse (HCC)    Suicidal ideation    Past Medical History:  Diagnosis Date   Complication of anesthesia    Hard to wake up   Endocarditis    Hepatitis    Hep C   Heroin abuse (HCC)    History of kidney stones    Hypertension    MRSA (methicillin resistant staph aureus) culture positive    Ovarian cyst    UTI (lower urinary tract infection)     Family History  Problem Relation Age of Onset   Valvular heart disease Maternal Grandmother     Past Surgical History:  Procedure Laterality Date   APPLICATION OF ANGIOVAC N/A 12/05/2019   Procedure: APPLICATION OF ANGIOVAC;  Surgeon: Corliss Skains, MD;  [facility] OR;  Service: Vascular;  Laterality: N/A;  Patient will need intraoperative TEE Can be performed in OR 14, 15, or 17 if OR 16 is not available   CARDIAC CATHETERIZATION     FINGER SURGERY     HARDWARE REMOVAL Right 09/28/2020   Procedure: REMOVAL DEEP HARDWARE RIGHT TIBIA;  Surgeon: Nadara Mustard, MD;  Location: [facility] OR;  Service: Orthopedics;  Laterality: Right;   RIGHT HEART CATH N/A 09/25/2020   Procedure: RIGHT HEART CATH;  Surgeon:  Laurey Morale, MD;  Location: [facility] INVASIVE CV LAB;  Service: Cardiovascular;  Laterality: N/A;   TEE WITHOUT CARDIOVERSION N/A 11/07/2019   Procedure: TRANSESOPHAGEAL ECHOCARDIOGRAM (TEE);  Surgeon: Wendall Stade, MD;  Location: [facility] ENDOSCOPY;  Service: Cardiovascular;  Laterality: N/A;   TEE WITHOUT CARDIOVERSION  12/05/2019   Procedure: TRANSESOPHAGEAL ECHOCARDIOGRAM (TEE);  Surgeon: Corliss Skains, MD;  Location: [facility] OR;  Service: Vascular;;   TEE WITHOUT CARDIOVERSION N/A 12/05/2019   Procedure: TRANSESOPHAGEAL ECHOCARDIOGRAM (TEE);  Surgeon: Corliss Skains, MD;  Location: [facility] OR;  Service: Thoracic;  Laterality: N/A;    Social History   Occupational History   Not on file  Tobacco Use   Smoking status: Every Day    Types: Cigarettes   Smokeless tobacco: Never  Vaping Use   Vaping Use: Never used  Substance and Sexual Activity   Alcohol use: No   Drug use: Yes    Types: Marijuana, IV    Comment: opiods- pain medication/heroin   Sexual activity: Yes    Birth control/protection: None

## 2020-10-10 ENCOUNTER — Ambulatory Visit (INDEPENDENT_AMBULATORY_CARE_PROVIDER_SITE_OTHER): Payer: Self-pay | Admitting: Infectious Diseases

## 2020-10-10 ENCOUNTER — Other Ambulatory Visit: Payer: Self-pay

## 2020-10-10 ENCOUNTER — Telehealth: Payer: Self-pay

## 2020-10-10 ENCOUNTER — Encounter: Payer: Self-pay | Admitting: Infectious Diseases

## 2020-10-10 ENCOUNTER — Other Ambulatory Visit: Payer: Self-pay | Admitting: *Deleted

## 2020-10-10 ENCOUNTER — Other Ambulatory Visit (HOSPITAL_COMMUNITY): Payer: Self-pay

## 2020-10-10 VITALS — BP 110/70 | HR 114 | Temp 98.0°F | Wt 101.0 lb

## 2020-10-10 DIAGNOSIS — B182 Chronic viral hepatitis C: Secondary | ICD-10-CM

## 2020-10-10 DIAGNOSIS — F1990 Other psychoactive substance use, unspecified, uncomplicated: Secondary | ICD-10-CM

## 2020-10-10 DIAGNOSIS — T847XXS Infection and inflammatory reaction due to other internal orthopedic prosthetic devices, implants and grafts, sequela: Secondary | ICD-10-CM

## 2020-10-10 DIAGNOSIS — F17200 Nicotine dependence, unspecified, uncomplicated: Secondary | ICD-10-CM

## 2020-10-10 DIAGNOSIS — M869 Osteomyelitis, unspecified: Secondary | ICD-10-CM

## 2020-10-10 NOTE — Telephone Encounter (Signed)
Attempted to call Short Stay regarding patient's infusion that is scheduled for infusion on 9/29 and 10/14. No insurance is listed in chart.  Spoke with patient who states she has approval letter for antibiotic. Medication is approved through 09/13/21. Patient should be okay to continue with medication.  Juanita Laster, RMA

## 2020-10-10 NOTE — Progress Notes (Addendum)
[facility]                                      [address], [place], [place], [address]                                               Phn. [phone]; Fax: [phone]                                                               Date: 10/10/20  Reason for Follow Up - HFU for RT tibial osteomyelitis    HPI: Amber [name] is a 30 y.o.old female with PMH of IVDU on suboxone,  hepatitis C s/p tx and SVR who is here for follow up for RT tibial Osteomyelitis.  She was admitted at [facility] after last clinic visit from 9/16-9/20 where she went to OR on 916 where hardware of the rt tibia was removed although one of the screw broke which was outside the zone of infection within the bone that was left in place. She was on IV Vancomycin/cefepime in the hospital and was discharged after a dose of Oritavancin on 9/20. She is doing well post op and denies any complaints like fevers, chills, sweats. Denies any pain/tenderness, swelling and drainage from the surgical site. Seen by Orthopedics on 9/23 when wound vac was removed. Wound was thought to be healing well. Discussed with her about she has been cured of HCV infection but can get re-infected with continued risk factors. She is due to get her US abdomen done. She has as a follow up with Orthopedics tomorrow.   She was  admitted in the hospital 11/30/19-01/16/20 for MRSA bacteremia with septic pulmonary emboli, TV endocarditis s/p angiovac. She completed treatment with IV abx on 01/16/20 and was discharged on the same day. She hs been following Cardiology and Cardiac Surgery. Last seen by CT surgery DR Cliffton Asters on 8/5 where he said Tricuspid valve repair to be delayed until Rt leg wound infection is completely addressed. She had a rt heart cath by Dr Erlinda Hong lean on 9/13 with normal PA pressure, normal PCWP, RA pressure normal but significant TVR.   ROS: 12 point ROS done with pertinent  positives and negatives listed above  Past Medical History:  Diagnosis Date   Complication of anesthesia    Hard to wake up   Endocarditis    Hepatitis    Hep C   Heroin abuse (HCC)    History of kidney stones    Hypertension    MRSA (methicillin resistant staph aureus) culture positive    Ovarian cyst    UTI (lower urinary tract infection)    Past Surgical History:  Procedure Laterality Date   APPLICATION OF ANGIOVAC N/A 12/05/2019   Procedure: APPLICATION OF ANGIOVAC;  Surgeon: Corliss Skains, MD;  [facility] OR;  Service: Vascular;  Laterality: N/A;  Patient will need intraoperative TEE Can be performed in OR 14, 15, or 17 if OR 16 is not available   CARDIAC CATHETERIZATION  FINGER SURGERY     HARDWARE REMOVAL Right 09/28/2020   Procedure: REMOVAL DEEP HARDWARE RIGHT TIBIA;  Surgeon: Nadara Mustard, MD;  Location: [facility] OR;  Service: Orthopedics;  Laterality: Right;   RIGHT HEART CATH N/A 09/25/2020   Procedure: RIGHT HEART CATH;  Surgeon: Laurey Morale, MD;  Location: [facility] INVASIVE CV LAB;  Service: Cardiovascular;  Laterality: N/A;   TEE WITHOUT CARDIOVERSION N/A 11/07/2019   Procedure: TRANSESOPHAGEAL ECHOCARDIOGRAM (TEE);  Surgeon: Wendall Stade, MD;  Location: [facility] ENDOSCOPY;  Service: Cardiovascular;  Laterality: N/A;   TEE WITHOUT CARDIOVERSION  12/05/2019   Procedure: TRANSESOPHAGEAL ECHOCARDIOGRAM (TEE);  Surgeon: Corliss Skains, MD;  Location: [facility] OR;  Service: Vascular;;   TEE WITHOUT CARDIOVERSION N/A 12/05/2019   Procedure: TRANSESOPHAGEAL ECHOCARDIOGRAM (TEE);  Surgeon: Corliss Skains, MD;  Location: [facility] OR;  Service: Thoracic;  Laterality: N/A;   Current Outpatient Medications on File Prior to Visit  Medication Sig Dispense Refill   acetaminophen (TYLENOL) 500 MG tablet Take 1,000 mg by mouth every 6 (six) hours as needed for headache.      buprenorphine-naloxone (SUBOXONE) 8-2 mg SUBL SL tablet Place 2 tablets under the tongue daily.      diclofenac Sodium (VOLTAREN) 1 % GEL Apply 4 g topically 4 (four) times daily. (Patient taking differently: Apply 4 g topically daily as needed (pain).) 4 g 6   escitalopram (LEXAPRO) 20 MG tablet Take 1 tablet (20 mg total) by mouth at bedtime. 30 tablet 6   gabapentin (NEURONTIN) 600 MG tablet Take 0.5 tablets (300 mg total) by mouth 3 (three) times daily. (Patient taking differently: Take 600 mg by mouth at bedtime.) 90 tablet 6   ibuprofen (ADVIL) 600 MG tablet Take 1 tablet (600 mg total) by mouth every 6 (six) hours as needed. 60 tablet 0   lidocaine (LIDODERM) 5 % Place 2 patches onto the skin daily as needed (pain). Remove & Discard patch within 12 hours or as directed by MD     Multiple Vitamin (MULTIVITAMIN WITH MINERALS) TABS tablet Take 1 tablet by mouth daily. 30 tablet 6   torsemide (DEMADEX) 10 MG tablet Take 1 tablet (10 mg total) by mouth daily as needed (AS NEEDED FOR SHORTNESS OF BREATH OR SWELLING). 30 tablet 3   No current facility-administered medications on file prior to visit.    Allergies  Allergen Reactions   Bee Venom Anaphylaxis   Other Anaphylaxis    Bee sting   Social History   Socioeconomic History   Marital status: Single    Spouse name: Not on file   Number of children: Not on file   Years of education: Not on file   Highest education level: Not on file  Occupational History   Not on file  Tobacco Use   Smoking status: Every Day    Types: Cigarettes   Smokeless tobacco: Never  Vaping Use   Vaping Use: Never used  Substance and Sexual Activity   Alcohol use: No   Drug use: Yes    Types: Marijuana, IV    Comment: opiods- pain medication/heroin   Sexual activity: Yes    Birth control/protection: None  Other Topics Concern   Not on file  Social History Narrative   Not on file   Social Determinants of Health   Financial Resource Strain: Not on file  Food Insecurity: Not on file  Transportation Needs: Not on file  Physical Activity: Not on  file  Stress: Not on file  Social Connections:  Not on file  Intimate Partner Violence: Not on file    Vitals  BP 110/70   Pulse (!) 114   Temp 98 F (36.7 C) (Oral)   Wt 101 lb (45.8 kg)   SpO2 (!) 86%   BMI 19.08 kg/m   Exam  Gen: Alert and oriented x 3, no acute distres HEENT:  no scleral icterus, no pale conjunctivae, hearing normal, oral mucosa moist Neck: Supple, no lymphadenopathy Cardio: Regular rate and rhythm; +S1 and S2 Resp: Clear air entry  GI: Soft, nontender Extremities:  RT leg   Skin: No rashes, lesions, or ecchymoses Neuro: grossly non focal  Psych: Calm, cooperative  Laboratory  CBC Latest Ref Rng & Units 09/25/2020 09/25/2020 09/25/2020  WBC 4.0 - 10.5 K/uL - - 4.0  Hemoglobin 12.0 - 15.0 g/dL 28.4 13.2 44.0  Hematocrit 36.0 - 46.0 % 40.0 42.0 44.0  Platelets 150 - 400 K/uL - - 170   CMP Latest Ref Rng & Units 09/29/2020 09/28/2020 09/25/2020  Glucose 70 - 99 mg/dL 102(V) 253(G) -  BUN 6 - 20 mg/dL 16 10 -  Creatinine 6.44 - 1.00 mg/dL 0.34 7.42 -  Sodium 595 - 145 mmol/L 135 137 143  Potassium 3.5 - 5.1 mmol/L 4.1 4.1 3.8  Chloride 98 - 111 mmol/L 101 105 -  CO2 22 - 32 mmol/L 24 24 -  Calcium 8.9 - 10.3 mg/dL 9.6 9.2 -  Total Protein 6.1 - 8.1 g/dL - - -  Total Bilirubin 0.2 - 1.2 mg/dL - - -  Alkaline Phos 38 - 126 U/L - - -  AST 10 - 30 U/L - - -  ALT 6 - 29 U/L - - -    Assessment/Plan: Problem List Items Addressed This Visit       Digestive   Chronic hepatitis C without hepatic coma (HCC)     Musculoskeletal and Integument   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 OR note reviewed where it was  mentioned one screw had broken outside of zone of infection and that was left.  OR cultures growing MRSA.  - Received Vanc from 9/16 until 9/20 when received a dose of Oritavancin on discharge day 9/20. Next dose of Dalbavancin 1500 mg scheduled for 10/10/20. Will schedule for another dose of dalbavancin 1000mg  on 10/14 and follow up in 3-4 weeks ( prior ot 11/09/20) and determine additional dose of dalbavancin vs po abtx - Follow up with Orthopedics as instructed    Hepatitis C in the setting or prior IVD S/p tx with 12 weeks of Epclusa with SVR  Follow up 11/11/20 abdomen to assess for cirrhosis. US abdomen in 01/2020 with borderline  to mild splenomegaly and elastography with 17.6kPa ( highly suggestive of cACLD with an increased probability of clinically significant portal hypertension) Will decide on GI Referral for EGD pending repeat 02/2020 abdomen  Smoking IVDU Quit smoking since last 2 weeks Clean from IVDU since 11/2019   Immunization Immune to Hepatitis A and B  I spent 35 minutes with the patient including review of prior medical records with greater than 50% of  time in face to face counsel of the patient.   Patient's labs were reviewed as well as his previous records. Patients questions were addressed and answered.   Electronically signed by:  Odette Fraction, MD Infectious Diseases  Office phone [phone] Fax no. [phone]

## 2020-10-11 ENCOUNTER — Ambulatory Visit (INDEPENDENT_AMBULATORY_CARE_PROVIDER_SITE_OTHER): Payer: Self-pay | Admitting: Orthopedic Surgery

## 2020-10-11 ENCOUNTER — Other Ambulatory Visit (HOSPITAL_COMMUNITY): Payer: Self-pay | Admitting: *Deleted

## 2020-10-11 ENCOUNTER — Ambulatory Visit (HOSPITAL_COMMUNITY)
Admission: RE | Admit: 2020-10-11 | Discharge: 2020-10-11 | Disposition: A | Discharge status: Home / Self Care | Payer: Self-pay | Source: Ambulatory Visit | Attending: Internal Medicine | Admitting: Internal Medicine

## 2020-10-11 VITALS — BP 100/72 | HR 72 | Temp 98.0°F | Resp 18 | Wt 102.0 lb

## 2020-10-11 DIAGNOSIS — T847XXS Infection and inflammatory reaction due to other internal orthopedic prosthetic devices, implants and grafts, sequela: Secondary | ICD-10-CM | POA: Insufficient documentation

## 2020-10-11 LAB — SEDIMENTATION RATE
Sed Rate: 14 mm/h (ref 0–20)
Sed Rate: 9 mm/hr (ref 0–22)

## 2020-10-11 LAB — COMPREHENSIVE METABOLIC PANEL
ALT: 16 U/L (ref 0–44)
AST: 30 U/L (ref 15–41)
Albumin: 3.9 g/dL (ref 3.5–5.0)
Alkaline Phosphatase: 189 U/L — ABNORMAL HIGH (ref 38–126)
Anion gap: 9 (ref 5–15)
BUN: 12 mg/dL (ref 6–20)
CO2: 23 mmol/L (ref 22–32)
Calcium: 9.4 mg/dL (ref 8.9–10.3)
Chloride: 105 mmol/L (ref 98–111)
Creatinine, Ser: 0.8 mg/dL (ref 0.44–1.00)
GFR, Estimated: >60 mL/min (ref >60)
Glucose, Bld: 93 mg/dL (ref 70–99)
Potassium: 4.4 mmol/L (ref 3.5–5.1)
Sodium: 137 mmol/L (ref 135–145)
Total Bilirubin: 0.9 mg/dL (ref 0.3–1.2)
Total Protein: 7.5 g/dL (ref 6.5–8.1)

## 2020-10-11 LAB — C-REACTIVE PROTEIN
CRP: 3.4 mg/L (ref <8.0)
CRP: 1 mg/dL — ABNORMAL HIGH (ref <1.0)

## 2020-10-11 MED ORDER — DEXTROSE 5 % IV SOLN
1500.0000 mg | Freq: Once | INTRAVENOUS | Status: AC
  Administered 2020-10-11: 1500 mg via INTRAVENOUS
  Filled 2020-10-11: qty 75

## 2020-10-11 NOTE — Progress Notes (Addendum)
Client c/o itching right groin after antibiotic, right groin with swelling noted and redness noted; notified Dr Staci Righter and per Dr Luciana Axe no new orders for now and per Dr Luciana Axe client advised to see her primary Dr or come to clinic to be seen, client advised and voiced understanding and she states she is going to clinic now; client also advised to go to ER if any problems or worsening of symptoms and she voiced understanding

## 2020-10-12 ENCOUNTER — Ambulatory Visit (HOSPITAL_COMMUNITY): Payer: Self-pay

## 2020-10-12 ENCOUNTER — Encounter (HOSPITAL_COMMUNITY): Payer: Medicaid Other | Admitting: Cardiology

## 2020-10-16 NOTE — Telephone Encounter (Addendum)
Called [employer] to request refill on Iv Dalvancin. Spoke with representative who states office will need to complete forms for refills since originally medication was ordered as replacement with no refills. Form must be filled out and signed before refills can be sent to infusion center.  Will need to call [employer] to request refills. Can take 24-72 hours for forms to be reviewed. P: [phone] option 9 Juanita Laster, RMA

## 2020-10-17 ENCOUNTER — Encounter: Payer: Self-pay | Admitting: Family

## 2020-10-17 ENCOUNTER — Ambulatory Visit (INDEPENDENT_AMBULATORY_CARE_PROVIDER_SITE_OTHER): Payer: Self-pay | Admitting: Family

## 2020-10-17 ENCOUNTER — Ambulatory Visit (HOSPITAL_COMMUNITY): Admission: RE | Admit: 2020-10-17 | Payer: Self-pay | Source: Ambulatory Visit

## 2020-10-17 DIAGNOSIS — T847XXS Infection and inflammatory reaction due to other internal orthopedic prosthetic devices, implants and grafts, sequela: Secondary | ICD-10-CM

## 2020-10-17 NOTE — Progress Notes (Signed)
Post-Op Visit Note   Patient: Amber [name]           Date of Birth: 03/31/1990           MRN: [id number] Visit Date: 10/17/2020 PCP: [facility], [facility]  Chief Complaint: No chief complaint on file.   HPI:  HPI The patient is a 30 year old woman who is seen status post I&D right tibia Ortho Exam Incision approximated with sutures and staples these were harvested today without incident.there is 1 area with gaping proximally this does not probe there is granulation in the bed this is a length of 1.5 cm. no drainage no surrounding erythema no warmth  Visit Diagnoses: No diagnosis found.  Plan: Continue daily Dial soap cleansing.  Dry dressing changes.  Follow-up in the office in 1 week for reevaluation  Follow-Up Instructions: No follow-ups on file.   Imaging: No results found.  Orders:  No orders of the defined types were placed in this encounter.  No orders of the defined types were placed in this encounter.    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, sequela    Chronic hepatitis C without hepatic coma (HCC) 09/26/2020   Medication monitoring encounter 09/26/2020   Immunization counseling 01/26/2020   IV infiltrate, subsequent encounter 01/16/2020   Septic pulmonary embolism without acute cor pulmonale (HCC) 01/16/2020   Hydropneumothorax 01/16/2020   Severe tricuspid regurgitation 01/16/2020   GAD (generalized anxiety disorder) 01/16/2020   Hepatitis C, chronic (HCC) 01/16/2020   Anemia, chronic disease 01/16/2020   Protein-calorie malnutrition, severe 12/27/2019   MRSA infection 12/06/2019   Opioid use with withdrawal (HCC)    Endocarditis of tricuspid valve    Palliative care encounter    Septic shock (HCC) 11/30/2019   Septic embolism (HCC) 11/05/2019   AKI (acute kidney  injury) (HCC) 11/05/2019   Hyponatremia 11/05/2019   Cellulitis of left foot 11/05/2019   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   Bacterial endocarditis 11/05/2019   Acute bacterial endocarditis    MRSA bacteremia    Sepsis (HCC) 11/03/2019   Abscess of face 08/20/2018   Facial cellulitis 11/21/2017   Depression    IV drug abuse (HCC)    Suicidal ideation    Past Medical History:  Diagnosis Date   Complication of anesthesia    Hard to wake up   Endocarditis    Hepatitis    Hep C   Heroin abuse (HCC)    History of kidney stones    Hypertension    MRSA (methicillin resistant staph aureus) culture positive    Ovarian cyst    UTI (lower urinary tract infection)     Family History  Problem Relation Age of Onset   Valvular heart disease Maternal Grandmother     Past Surgical History:  Procedure Laterality Date   APPLICATION OF ANGIOVAC N/A 12/05/2019   Procedure: APPLICATION OF ANGIOVAC;  Surgeon: Corliss Skains, MD;  Location: [facility] OR;  Service: Vascular;  Laterality: N/A;  Patient will need intraoperative TEE Can be performed in OR 14, 15, or 17 if OR 16 is not available   CARDIAC CATHETERIZATION     FINGER SURGERY     HARDWARE REMOVAL Right 09/28/2020   Procedure: REMOVAL DEEP HARDWARE RIGHT TIBIA;  Surgeon: Nadara Mustard, MD;  Location: [facility] OR;  Service: Orthopedics;  Laterality: Right;   RIGHT HEART CATH N/A 09/25/2020   Procedure: RIGHT HEART CATH;  Surgeon: Laurey Morale, MD;  Location: [facility] INVASIVE CV LAB;  Service: Cardiovascular;  Laterality: N/A;   TEE WITHOUT CARDIOVERSION N/A 11/07/2019   Procedure: TRANSESOPHAGEAL ECHOCARDIOGRAM (TEE);  Surgeon: Wendall Stade, MD;  Location: [facility] ENDOSCOPY;  Service: Cardiovascular;  Laterality: N/A;   TEE WITHOUT CARDIOVERSION  12/05/2019   Procedure: TRANSESOPHAGEAL ECHOCARDIOGRAM (TEE);  Surgeon: Corliss Skains, MD;  Location: [facility] OR;  Service: Vascular;;   TEE WITHOUT CARDIOVERSION N/A 12/05/2019   Procedure:  TRANSESOPHAGEAL ECHOCARDIOGRAM (TEE);  Surgeon: Corliss Skains, MD;  Location: [facility] OR;  Service: Thoracic;  Laterality: N/A;   Social History   Occupational History   Not on file  Tobacco Use   Smoking status: Former    Types: Cigarettes   Smokeless tobacco: Never   Tobacco comments:    Two weeks without smoking  Vaping Use   Vaping Use: Never used  Substance and Sexual Activity   Alcohol use: No   Drug use: Yes    Types: Marijuana, IV    Comment: opiods- pain medication/heroin   Sexual activity: Yes    Birth control/protection: None

## 2020-10-19 NOTE — Telephone Encounter (Signed)
Forms completed by Carver Fila, FNP for refills on Dalvancin. Forms faxed to [employer]. Will follow up on Monday to ensure medication is delivered by 10/14. Juanita Laster, RMA

## 2020-10-22 ENCOUNTER — Other Ambulatory Visit (HOSPITAL_BASED_OUTPATIENT_CLINIC_OR_DEPARTMENT_OTHER): Payer: Self-pay

## 2020-10-22 NOTE — Telephone Encounter (Signed)
Antibiotic was delivered to office today. Lupita Leash with the Pharmacy will inform inpatient pharmacy team.  Juanita Laster, RMA

## 2020-10-23 ENCOUNTER — Encounter: Payer: Self-pay | Admitting: Internal Medicine

## 2020-10-23 ENCOUNTER — Ambulatory Visit (INDEPENDENT_AMBULATORY_CARE_PROVIDER_SITE_OTHER): Payer: Self-pay | Admitting: Internal Medicine

## 2020-10-23 ENCOUNTER — Other Ambulatory Visit: Payer: Self-pay

## 2020-10-23 VITALS — BP 110/60 | HR 62 | Ht 61.0 in | Wt 102.6 lb

## 2020-10-23 DIAGNOSIS — I079 Rheumatic tricuspid valve disease, unspecified: Secondary | ICD-10-CM

## 2020-10-23 DIAGNOSIS — I50810 Right heart failure, unspecified: Secondary | ICD-10-CM

## 2020-10-23 DIAGNOSIS — I071 Rheumatic tricuspid insufficiency: Secondary | ICD-10-CM

## 2020-10-23 NOTE — Patient Instructions (Signed)

## 2020-10-24 ENCOUNTER — Ambulatory Visit: Payer: Medicaid Other | Admitting: Family

## 2020-10-25 ENCOUNTER — Other Ambulatory Visit (HOSPITAL_COMMUNITY): Payer: Self-pay | Admitting: *Deleted

## 2020-10-26 ENCOUNTER — Inpatient Hospital Stay (HOSPITAL_COMMUNITY): Admission: RE | Admit: 2020-10-26 | Payer: Medicaid Other | Source: Ambulatory Visit

## 2020-10-26 ENCOUNTER — Ambulatory Visit (HOSPITAL_COMMUNITY): Payer: Medicaid Other

## 2020-10-29 ENCOUNTER — Other Ambulatory Visit: Payer: Self-pay

## 2020-10-29 ENCOUNTER — Encounter (HOSPITAL_COMMUNITY): Payer: Self-pay | Admitting: Physical Therapy

## 2020-10-29 ENCOUNTER — Ambulatory Visit (HOSPITAL_COMMUNITY): Payer: Medicaid Other | Attending: Physician Assistant | Admitting: Physical Therapy

## 2020-10-29 DIAGNOSIS — L02415 Cutaneous abscess of right lower limb: Secondary | ICD-10-CM | POA: Insufficient documentation

## 2020-10-29 NOTE — Therapy (Signed)
[facility] [address] [place], [place], [address] Phone: [phone]   Fax:  [phone]  Patient Details  Name: Amber [name] MRN: [id number] Date of Birth: 1990/11/01 Referring Provider:  Persons, [place], [place]  Encounter Date: 10/29/2020  Patient arrives with bandage over healing surgical incision. Patient incision appears to be healing well with no open wound or signs of infection present. Patient educated on keeping incision clean and following up if any issues arise. Patient does not require any wound care services at this time.   8:47 AM, 10/29/20 Wyman Songster PT, DPT Physical Therapist at [facility]   [facility] [address] [place], [place], [address] Phone: [phone]   Fax:  [phone]

## 2020-10-31 ENCOUNTER — Telehealth: Payer: Self-pay

## 2020-10-31 NOTE — Telephone Encounter (Signed)
-----   Message from Bobette Mo, CPhT sent at 10/31/2020  4:44 PM EDT ----- Regarding: Infusion Hey Chassidy Layson,  This patient called and wanted to reschedule her infusion she said she missed her appt.    Thank You,  Clearance Coots, CPhT Specialty Pharmacy Patient [facility] Phone: [phone] Fax: [phone]

## 2020-10-31 NOTE — Telephone Encounter (Signed)
Patient missed appointment for infusion on 10/14. RN provided her with Short Stay number to reschedule.  Rodrick Payson [facility], RN

## 2020-11-05 ENCOUNTER — Other Ambulatory Visit: Payer: Self-pay

## 2020-11-05 ENCOUNTER — Ambulatory Visit (HOSPITAL_COMMUNITY)
Admission: RE | Admit: 2020-11-05 | Discharge: 2020-11-05 | Disposition: A | Discharge status: Home / Self Care | Payer: Self-pay | Source: Ambulatory Visit | Attending: Infectious Diseases | Admitting: Infectious Diseases

## 2020-11-05 DIAGNOSIS — B182 Chronic viral hepatitis C: Secondary | ICD-10-CM | POA: Insufficient documentation

## 2020-11-05 IMAGING — US US ABDOMEN COMPLETE
1 series · 14 of 25 positions shown · non-contrast
Comparison: Ultrasound [DATE]

CLINICAL DATA: Chronic hepatitis C.

EXAM:
ABDOMEN ULTRASOUND COMPLETE

[Series 1: us abdomen complete · 14 of 144 slices shown]
[im 1/144]
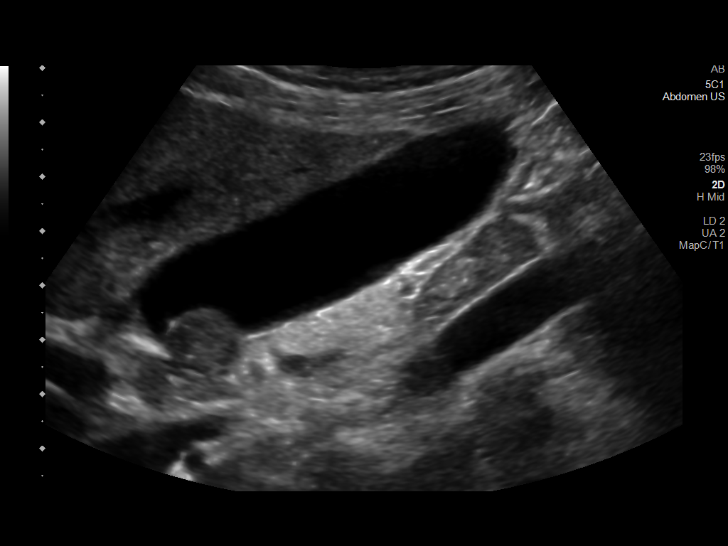
[im 12/144]
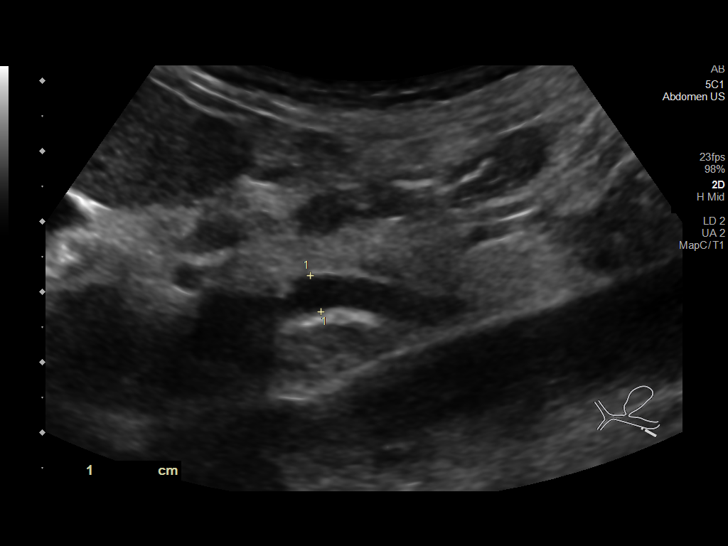
[im 24/144]
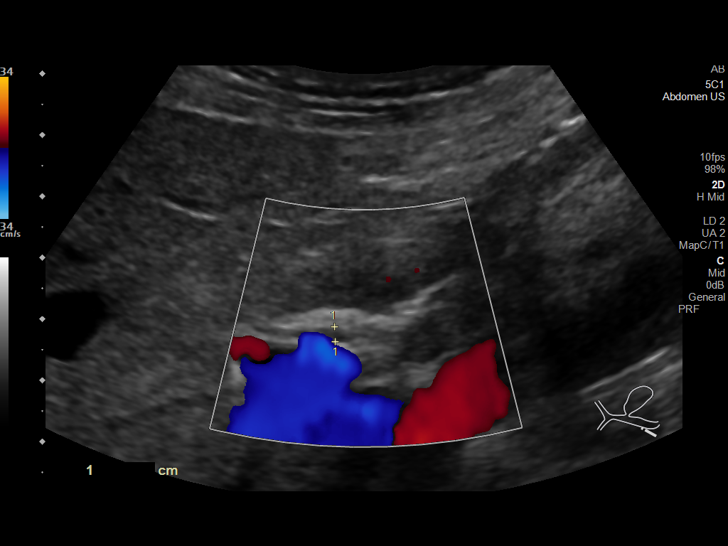
[im 36/144]
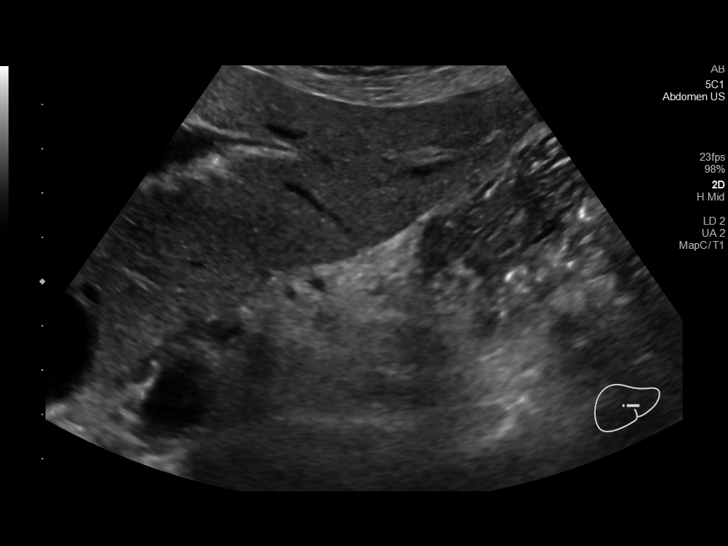
[im 48/144]
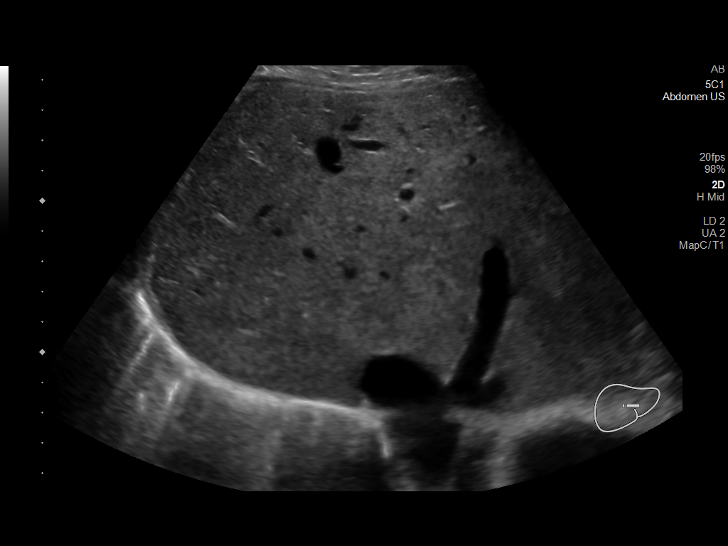
[im 54/144]
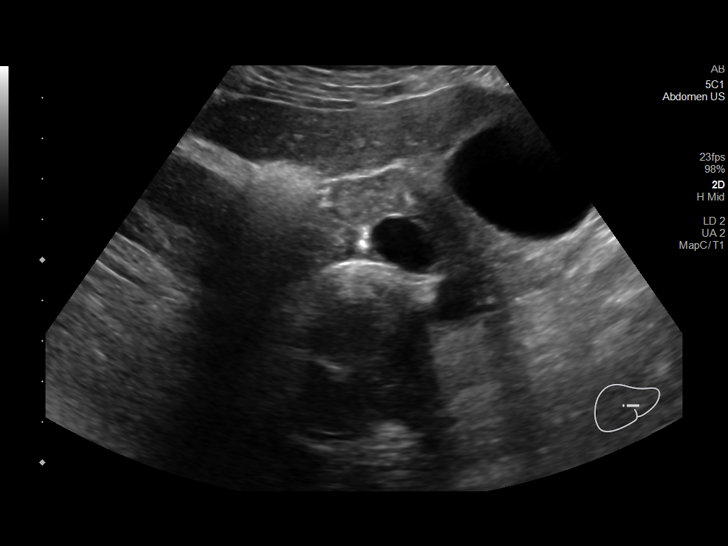
[im 66/144]
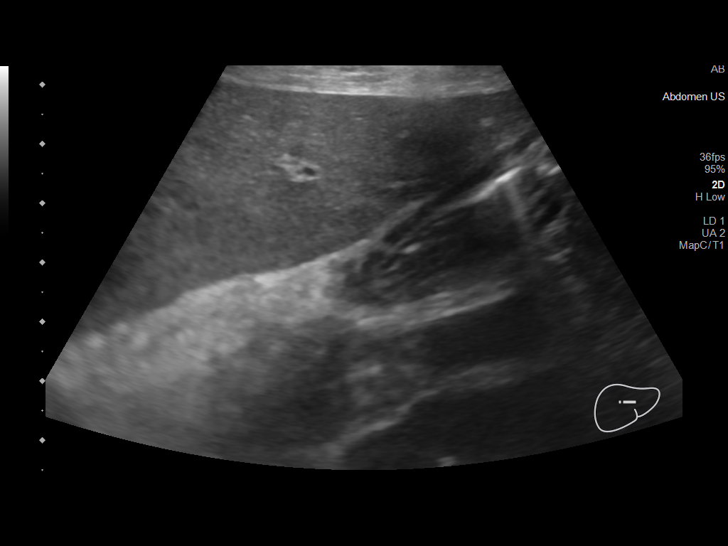
[im 78/144]
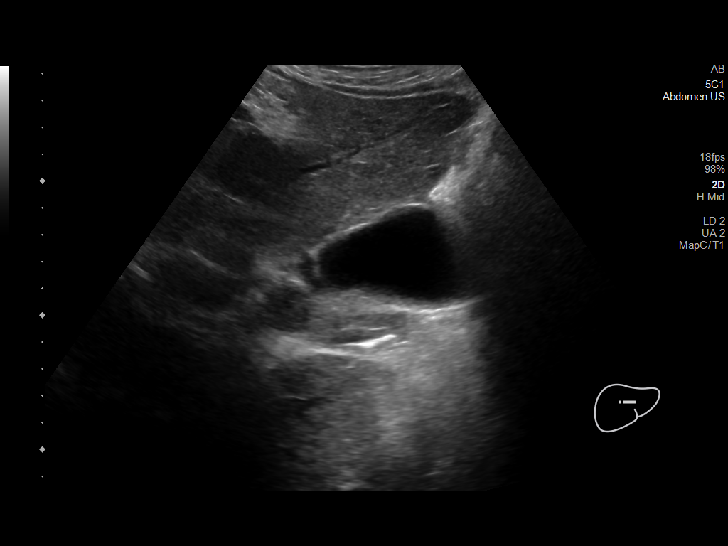
[im 90/144]
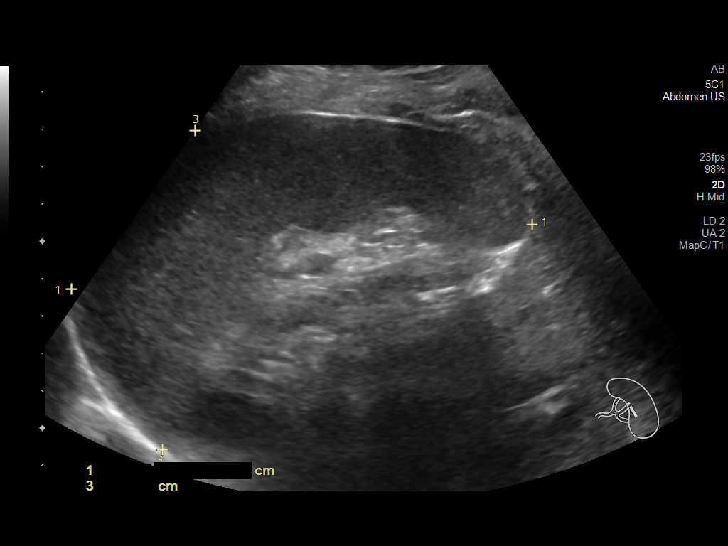
[im 96/144]
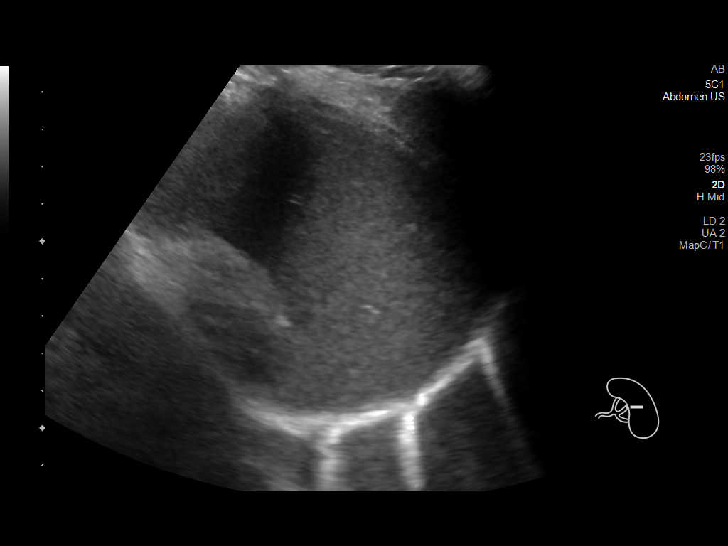
[im 108/144]
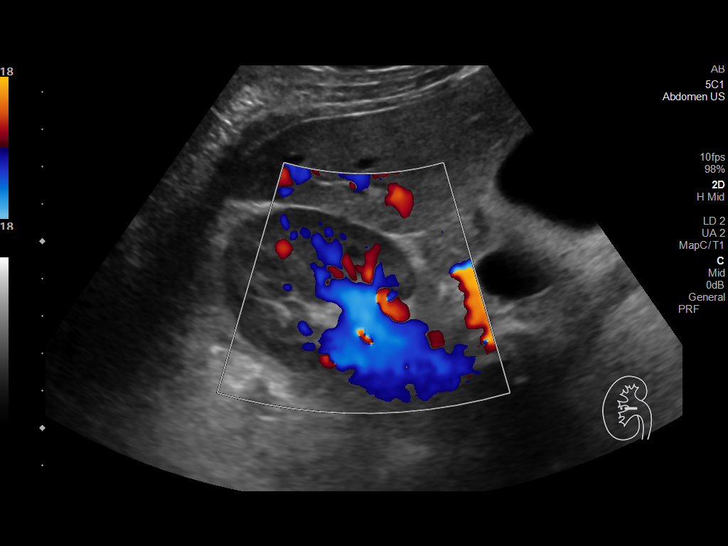
[im 120/144]
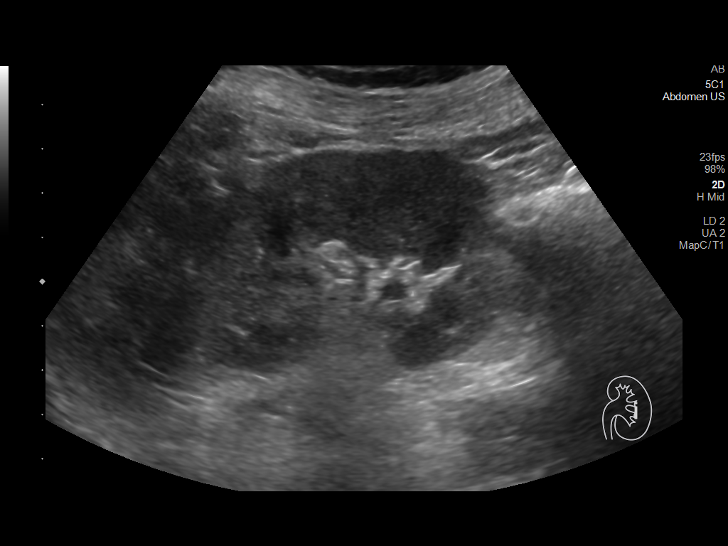
[im 132/144]
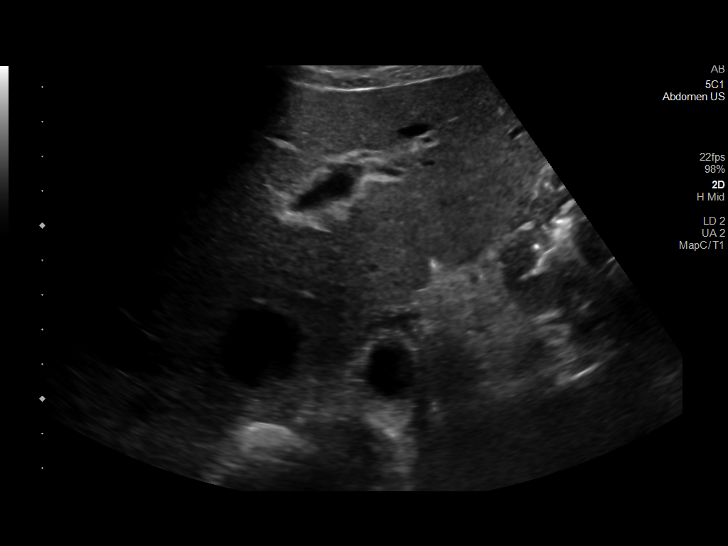
[im 144/144]
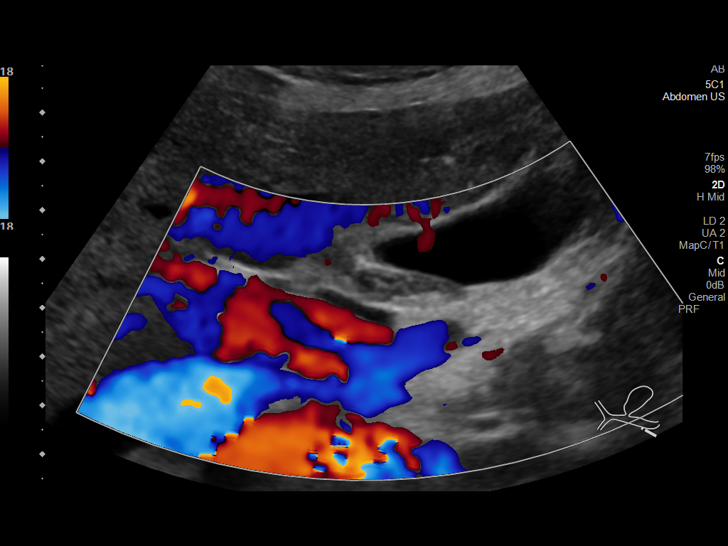

[14 of 25 positions shown; findings below may reference images not displayed]

FINDINGS: Gallbladder: No gallstones or wall thickening visualized. No
sonographic Murphy sign noted by sonographer.

Common bile duct: Diameter: 2 mm

Liver: No focal lesion identified. Within normal limits in
parenchymal echogenicity. Portal vein is patent on color Doppler
imaging with normal direction of blood flow towards the liver.

IVC: No abnormality visualized.

Pancreas: Visualized portion unremarkable.

Spleen: Borderline splenomegaly measuring 12.5 cm.

Right Kidney: Length: 10.3 cm. Echogenicity within normal limits. No
mass or hydronephrosis visualized.

Left Kidney: Length: 9.8 cm. Echogenicity within normal limits. No
mass or hydronephrosis visualized.

Abdominal aorta: No aneurysm visualized.

Other findings: None.
IMPRESSION: No suspicious hepatic lesion.

Borderline splenomegaly.

## 2020-11-06 ENCOUNTER — Ambulatory Visit: Payer: Medicaid Other | Admitting: Infectious Diseases

## 2020-11-06 ENCOUNTER — Telehealth: Payer: Self-pay

## 2020-11-06 NOTE — Telephone Encounter (Signed)
Spoke with Clide Cliff, let her know that Dr. Elinor Parkinson is okay with patient scheduling missed infusion.   Sandie Ano, RN

## 2020-11-06 NOTE — Telephone Encounter (Signed)
Received call from [facility] with short stay, patient missed appointment on 10/14. Patient scheduled for follow up with Dr. Elinor Parkinson 11/3. Will route to provider.   Sandie Ano, RN

## 2020-11-11 ENCOUNTER — Encounter: Payer: Self-pay | Admitting: Orthopedic Surgery

## 2020-11-11 NOTE — Progress Notes (Signed)
Office Visit Note   Patient: Amber [name]           Date of Birth: [birth year]           MRN: [id number] Visit Date: 10/11/2020              Requested by: Center, [facility] [address] [place],  [place] [address] PCP: Center, [facility]  Chief Complaint  Patient presents with   Right Leg - Follow-up      HPI: Patient is 2 weeks status post removal of deep infected hardware right leg.  Assessment & Plan: Visit Diagnoses:  1. 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, sequela     Plan: Follow-up in 1 week.  Follow-Up Instructions: Return in about 1 week (around 10/18/2020).   Ortho Exam  Patient is alert, oriented, no adenopathy, well-dressed, normal affect, normal respiratory effort. Examination patient has no pain with weightbearing there is a small amount of clear serous drainage no redness no cellulitis no signs of infection infectious disease has scheduled her third infusion next month.  Imaging: No results found. No images are attached to the encounter.  Labs: Lab Results  Component Value Date   HGBA1C 6.3 (H) 11/04/2019   ESRSEDRATE 9 10/11/2020   ESRSEDRATE 14 10/10/2020   ESRSEDRATE 9 09/26/2020   CRP 1.0 (H) 10/11/2020   CRP 3.4 10/10/2020   CRP 2.3 09/26/2020   REPTSTATUS 10/03/2020 FINAL 09/28/2020   GRAMSTAIN NO WBC SEEN NO ORGANISMS SEEN  09/28/2020   CULT  09/28/2020    RARE METHICILLIN RESISTANT STAPHYLOCOCCUS AUREUS NO ANAEROBES ISOLATED CRITICAL RESULT CALLED TO, READ BACK BY AND VERIFIED WITH: DR. Tynesha Free [phone] FCP Performed at [facility], [address], [place], [place] [address]    LABORGA METHICILLIN RESISTANT STAPHYLOCOCCUS AUREUS 09/28/2020     Lab Results  Component Value Date   ALBUMIN 3.9 10/11/2020   ALBUMIN 2.0 (L) 12/16/2019   ALBUMIN 2.0 (L) 12/15/2019    Lab Results  Component Value Date   MG 1.7 12/15/2019   MG 1.5 (L) 12/14/2019   MG 1.7 12/12/2019   No results found for:  VD25OH  No results found for: PREALBUMIN CBC EXTENDED Latest Ref Rng & Units 09/25/2020 09/25/2020 09/25/2020  WBC 4.0 - 10.5 K/uL - - 4.0  RBC 3.87 - 5.11 MIL/uL - - 5.19(H)  HGB 12.0 - 15.0 g/dL 32.4 40.1 02.7  HCT 25.3 - 46.0 % 40.0 42.0 44.0  PLT 150 - 400 K/uL - - 170  NEUTROABS 1.7 - 7.7 K/uL - - -  LYMPHSABS 0.7 - 4.0 K/uL - - -     There is no height or weight on file to calculate BMI.  Orders:  No orders of the defined types were placed in this encounter.  No orders of the defined types were placed in this encounter.    Procedures: No procedures performed  Clinical Data: No additional findings.  ROS:  All other systems negative, except as noted in the HPI. Review of Systems  Objective: Vital Signs: There were no vitals taken for this visit.  Specialty Comments:  No specialty comments available.  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, sequela    Chronic hepatitis C without hepatic coma (HCC) 09/26/2020   Medication monitoring encounter  09/26/2020   Immunization counseling 01/26/2020   IV infiltrate, subsequent encounter 01/16/2020   Septic pulmonary embolism without acute cor pulmonale ([place]) 01/16/2020   Hydropneumothorax 01/16/2020   Severe tricuspid regurgitation 01/16/2020   GAD (generalized anxiety disorder) 01/16/2020   Hepatitis C, chronic ([place]) 01/16/2020   Anemia, chronic disease 01/16/2020   Protein-calorie malnutrition, severe 12/27/2019   MRSA infection 12/06/2019   Opioid use with withdrawal ([place])    Endocarditis of tricuspid valve    Palliative care encounter    Septic shock ([place]) 11/30/2019   Septic embolism ([place]) 11/05/2019   AKI (acute kidney injury) ([place]) 11/05/2019   Hyponatremia 11/05/2019   Cellulitis of left foot 11/05/2019   Right ventricular mass  11/05/2019   Bacterial endocarditis 11/05/2019   Acute bacterial endocarditis    MRSA bacteremia    Sepsis ([place]) 11/03/2019   Abscess of face 08/20/2018   Facial cellulitis 11/21/2017   Depression    IV drug abuse ([place])    Suicidal ideation    Past Medical History:  Diagnosis Date   Complication of anesthesia    Hard to wake up   Endocarditis    Hepatitis    Hep C   Heroin abuse ([place])    History of kidney stones    Hypertension    MRSA (methicillin resistant staph aureus) culture positive    Ovarian cyst    UTI (lower urinary tract infection)     Family History  Problem Relation Age of Onset   Valvular heart disease Maternal Grandmother     Past Surgical History:  Procedure Laterality Date   APPLICATION OF ANGIOVAC N/A 12/05/2019   Procedure: APPLICATION OF ANGIOVAC;  Surgeon: Lajuana Matte, MD;  Location: [place];  Service: Vascular;  Laterality: N/A;  Patient will need intraoperative TEE Can be performed in OR 14, 15, or 17 if OR 16 is not available   CARDIAC CATHETERIZATION     FINGER SURGERY     HARDWARE REMOVAL Right 09/28/2020   Procedure: REMOVAL DEEP HARDWARE RIGHT TIBIA;  Surgeon: Newt Minion, MD;  Location: [place];  Service: Orthopedics;  Laterality: Right;   RIGHT HEART CATH N/A 09/25/2020   Procedure: RIGHT HEART CATH;  Surgeon: Larey Dresser, MD;  Location: [place] CV LAB;  Service: Cardiovascular;  Laterality: N/A;   TEE WITHOUT CARDIOVERSION N/A 11/07/2019   Procedure: TRANSESOPHAGEAL ECHOCARDIOGRAM (TEE);  Surgeon: Josue Hector, MD;  Location: [facility] ENDOSCOPY;  Service: Cardiovascular;  Laterality: N/A;   TEE WITHOUT CARDIOVERSION  12/05/2019   Procedure: TRANSESOPHAGEAL ECHOCARDIOGRAM (TEE);  Surgeon: Lajuana Matte, MD;  Location: [place];  Service: Vascular;;   TEE WITHOUT CARDIOVERSION N/A 12/05/2019   Procedure: TRANSESOPHAGEAL ECHOCARDIOGRAM (TEE);  Surgeon: Lajuana Matte, MD;  Location: [facility] OR;  Service: Thoracic;  Laterality:  N/A;   Social History   Occupational History   Not on file  Tobacco Use   Smoking status: Former    Types: Cigarettes   Smokeless tobacco: Never   Tobacco comments:    Two weeks without smoking  Vaping Use   Vaping Use: Never used  Substance and Sexual Activity   Alcohol use: No   Drug use: Yes    Types: Marijuana, IV    Comment: opiods- pain medication/heroin   Sexual activity: Yes    Birth control/protection: None

## 2020-11-14 ENCOUNTER — Ambulatory Visit (HOSPITAL_COMMUNITY)
Admission: RE | Admit: 2020-11-14 | Discharge: 2020-11-14 | Disposition: A | Discharge status: Home / Self Care | Payer: Medicaid Other | Source: Ambulatory Visit | Attending: Internal Medicine | Admitting: Internal Medicine

## 2020-11-14 ENCOUNTER — Other Ambulatory Visit: Payer: Self-pay

## 2020-11-14 DIAGNOSIS — M869 Osteomyelitis, unspecified: Secondary | ICD-10-CM | POA: Insufficient documentation

## 2020-11-14 MED ORDER — DEXTROSE 5 % IV SOLN
1000.0000 mg | Freq: Once | INTRAVENOUS | Status: AC
  Administered 2020-11-14: 1000 mg via INTRAVENOUS
  Filled 2020-11-14: qty 50

## 2020-11-15 ENCOUNTER — Ambulatory Visit: Payer: Self-pay | Admitting: Infectious Diseases

## 2020-11-16 ENCOUNTER — Encounter (HOSPITAL_COMMUNITY): Payer: Self-pay | Admitting: Internal Medicine

## 2020-11-20 ENCOUNTER — Encounter (HOSPITAL_COMMUNITY): Payer: Self-pay | Admitting: Internal Medicine

## 2020-11-22 ENCOUNTER — Encounter (HOSPITAL_COMMUNITY): Payer: Self-pay | Admitting: Cardiology

## 2020-11-22 ENCOUNTER — Ambulatory Visit (HOSPITAL_COMMUNITY)
Admission: RE | Admit: 2020-11-22 | Discharge: 2020-11-22 | Disposition: A | Discharge status: Home / Self Care | Payer: Self-pay | Source: Ambulatory Visit | Attending: Cardiology | Admitting: Cardiology

## 2020-11-22 ENCOUNTER — Other Ambulatory Visit: Payer: Self-pay

## 2020-11-22 ENCOUNTER — Ambulatory Visit (HOSPITAL_BASED_OUTPATIENT_CLINIC_OR_DEPARTMENT_OTHER)
Admission: RE | Admit: 2020-11-22 | Discharge: 2020-11-22 | Disposition: A | Discharge status: Home / Self Care | Payer: Self-pay | Source: Ambulatory Visit | Attending: Cardiology | Admitting: Cardiology

## 2020-11-22 VITALS — BP 100/62 | HR 70 | Wt 108.0 lb

## 2020-11-22 DIAGNOSIS — I50810 Right heart failure, unspecified: Secondary | ICD-10-CM | POA: Insufficient documentation

## 2020-11-22 DIAGNOSIS — I071 Rheumatic tricuspid insufficiency: Secondary | ICD-10-CM

## 2020-11-22 LAB — ECHOCARDIOGRAM COMPLETE
Area-P 1/2: 4.49 cm2
S' Lateral: 2.2 cm

## 2020-11-22 NOTE — Patient Instructions (Signed)
Your physician recommends that you schedule a follow-up appointment in: 3 months  You have been referred to Dr Cliffton Asters, his office will call you for an appointment  If you have any questions or concerns before your next appointment please send [name] a message through [facility] or call our office at [phone].    TO LEAVE A MESSAGE FOR THE NURSE SELECT OPTION 2, PLEASE LEAVE A MESSAGE INCLUDING: YOUR NAME DATE OF BIRTH CALL BACK NUMBER REASON FOR CALL**this is important as we prioritize the call backs  YOU WILL RECEIVE A CALL BACK THE SAME DAY AS LONG AS YOU CALL BEFORE 4:00 PM  At the [facility], you and your health needs are our priority. As part of our continuing mission to provide you with exceptional heart care, we have created designated Provider Care Teams. These Care Teams include your primary Cardiologist (physician) and Advanced Practice Providers (APPs- Physician Assistants and Nurse Practitioners) who all work together to provide you with the care you need, when you need it.   You may see any of the following providers on your designated Care Team at your next follow up: Dr Arvilla Meres Dr Carron Curie, NP Robbie Lis, [place] [facility] [place], [place] Karle Plumber, PharmD   Please be sure to bring in all your medications bottles to every appointment.

## 2020-11-22 NOTE — Progress Notes (Signed)
  Echocardiogram 2D Echocardiogram has been performed.  Amber [name] 11/22/2020, 10:05 AM
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
## 2020-11-30 ENCOUNTER — Ambulatory Visit (INDEPENDENT_AMBULATORY_CARE_PROVIDER_SITE_OTHER): Payer: Self-pay | Admitting: Infectious Diseases

## 2020-11-30 ENCOUNTER — Other Ambulatory Visit: Payer: Self-pay

## 2020-11-30 ENCOUNTER — Encounter: Payer: Self-pay | Admitting: Infectious Diseases

## 2020-11-30 VITALS — BP 94/56 | HR 62 | Temp 97.9°F | Resp 16 | Ht 61.0 in | Wt 109.8 lb

## 2020-11-30 DIAGNOSIS — I079 Rheumatic tricuspid valve disease, unspecified: Secondary | ICD-10-CM

## 2020-11-30 DIAGNOSIS — B182 Chronic viral hepatitis C: Secondary | ICD-10-CM

## 2020-11-30 DIAGNOSIS — M869 Osteomyelitis, unspecified: Secondary | ICD-10-CM

## 2020-11-30 NOTE — Progress Notes (Signed)
[facility] for Infectious Diseases                                      [address], [place], [place], [address]                                               Phn. [phone]; Fax: [phone]                                                               Date: 11/30/2020  Reason for Follow Up - HFU for RT tibial osteomyelitis    HPI: Amber [name] is a 30 y.o.old female with PMH of IVDU on suboxone,  hepatitis C s/p tx and SVR, MRSA bacteremia with septic pulmonary emboli, TV endocarditis s/p angiovac.who is here for follow up for RT tibial Osteomyelitis.  Missed dalbavancin infusion in 10/14 and was rescheduled on 11/2. Seen by Dr Shirlee Latch on 11/10. TTE with severe TVR11/10 Her left knee surgical site has healed. Denies any fevers, chills, sweats. She is not on any PO abtx. Denies chest pain and cough, but has exertional dyspnea. She gets SOB after 30 mins of playing with kids or 30 mins in shopping. Denies use of IVDU and smoking. Living with her mother currently. She is very appreciative of care. She is asking when she will be ready to undergo her heart valve surgery.   ROS: 12 point ROS done with pertinent positives and negatives listed above  Past Medical History:  Diagnosis Date   Complication of anesthesia    Hard to wake up   Endocarditis    Hepatitis    Hep C   Heroin abuse (HCC)    History of kidney stones    Hypertension    MRSA (methicillin resistant staph aureus) culture positive    Ovarian cyst    UTI (lower urinary tract infection)    Past Surgical History:  Procedure Laterality Date   APPLICATION OF ANGIOVAC N/A 12/05/2019   Procedure: APPLICATION OF ANGIOVAC;  Surgeon: Corliss Skains, MD;  Location: [facility] OR;  Service: Vascular;  Laterality: N/A;  Patient will need intraoperative TEE Can be performed in OR 14, 15, or 17 if OR 16 is not available   CARDIAC CATHETERIZATION     FINGER SURGERY     HARDWARE  REMOVAL Right 09/28/2020   Procedure: REMOVAL DEEP HARDWARE RIGHT TIBIA;  Surgeon: Nadara Mustard, MD;  Location: [facility] OR;  Service: Orthopedics;  Laterality: Right;   RIGHT HEART CATH N/A 09/25/2020   Procedure: RIGHT HEART CATH;  Surgeon: Laurey Morale, MD;  Location: [facility] INVASIVE CV LAB;  Service: Cardiovascular;  Laterality: N/A;   TEE WITHOUT CARDIOVERSION N/A 11/07/2019   Procedure: TRANSESOPHAGEAL ECHOCARDIOGRAM (TEE);  Surgeon: Wendall Stade, MD;  Location: [facility] ENDOSCOPY;  Service: Cardiovascular;  Laterality: N/A;   TEE WITHOUT CARDIOVERSION  12/05/2019   Procedure: TRANSESOPHAGEAL ECHOCARDIOGRAM (TEE);  Surgeon: Corliss Skains, MD;  Location: [facility] OR;  Service: Vascular;;   TEE WITHOUT CARDIOVERSION N/A 12/05/2019   Procedure: TRANSESOPHAGEAL ECHOCARDIOGRAM (TEE);  Surgeon: Lajuana Matte, MD;  Location: [facility] OR;  Service: Thoracic;  Laterality: N/A;   Current Outpatient Medications on File Prior to Visit  Medication Sig Dispense Refill   acetaminophen (TYLENOL) 500 MG tablet Take 1,000 mg by mouth every 6 (six) hours as needed for headache.      buprenorphine-naloxone (SUBOXONE) 8-2 mg SUBL SL tablet Place 2 tablets under the tongue daily.     diclofenac Sodium (VOLTAREN) 1 % GEL Apply 1 application topically as needed.     escitalopram (LEXAPRO) 20 MG tablet Take 1 tablet (20 mg total) by mouth at bedtime. 30 tablet 6   gabapentin (NEURONTIN) 600 MG tablet Take 600 mg by mouth at bedtime.     ibuprofen (ADVIL) 600 MG tablet Take 1 tablet (600 mg total) by mouth every 6 (six) hours as needed. 60 tablet 0   lidocaine (LIDODERM) 5 % Place 2 patches onto the skin daily as needed (pain). Remove & Discard patch within 12 hours or as directed by MD     Multiple Vitamin (MULTIVITAMIN WITH MINERALS) TABS tablet Take 1 tablet by mouth daily. 30 tablet 6   torsemide (DEMADEX) 10 MG tablet Take 1 tablet (10 mg total) by mouth daily as needed (AS NEEDED FOR SHORTNESS OF BREATH OR  SWELLING). 30 tablet 3   No current facility-administered medications on file prior to visit.    Allergies  Allergen Reactions   Bee Venom Anaphylaxis   Other Anaphylaxis    Bee sting   Social History   Socioeconomic History   Marital status: Single    Spouse name: Not on file   Number of children: Not on file   Years of education: Not on file   Highest education level: Not on file  Occupational History   Not on file  Tobacco Use   Smoking status: Former    Types: Cigarettes   Smokeless tobacco: Never   Tobacco comments:    Two weeks without smoking  Vaping Use   Vaping Use: Never used  Substance and Sexual Activity   Alcohol use: No   Drug use: Yes    Types: Marijuana, IV    Comment: opiods- pain medication/heroin   Sexual activity: Yes    Birth control/protection: None  Other Topics Concern   Not on file  Social History Narrative   Not on file   Social Determinants of Health   Financial Resource Strain: Not on file  Food Insecurity: Not on file  Transportation Needs: Not on file  Physical Activity: Not on file  Stress: Not on file  Social Connections: Not on file  Intimate Partner Violence: Not on file   Family History  Problem Relation Age of Onset   Valvular heart disease Maternal Grandmother     Vitals  BP (!) 94/56   Pulse 62   Temp 97.9 F (36.6 C) (Oral)   Resp 16   Ht 5\' 1"  (1.549 m)   Wt 109 lb 12.8 oz (49.8 kg)   SpO2 98%   BMI 20.75 kg/m    Exam  Gen: Alert and oriented x 3, no acute distres HEENT:  no scleral icterus, no pale conjunctivae, hearing normal, oral mucosa moist Neck: Supple Cardio: Regular rate and rhythm Resp: Clear air entry  GI: Soft, nontender Extremities:  RT leg    Skin: No rashes, lesions, or ecchymoses Neuro: grossly non focal  Psych: Calm, cooperative  Laboratory  CBC Latest Ref Rng & Units 09/25/2020 09/25/2020 09/25/2020  WBC 4.0 -  10.5 K/uL - - 4.0  Hemoglobin 12.0 - 15.0 g/dL 13.6 14.3 14.2   Hematocrit 36.0 - 46.0 % 40.0 42.0 44.0  Platelets 150 - 400 K/uL - - 170   CMP Latest Ref Rng & Units 10/11/2020 09/29/2020 09/28/2020  Glucose 70 - 99 mg/dL 93 144(H) 106(H)  BUN 6 - 20 mg/dL 12 16 10   Creatinine 0.44 - 1.00 mg/dL 0.80 0.84 0.72  Sodium 135 - 145 mmol/L 137 135 137  Potassium 3.5 - 5.1 mmol/L 4.4 4.1 4.1  Chloride 98 - 111 mmol/L 105 101 105  CO2 22 - 32 mmol/L 23 24 24   Calcium 8.9 - 10.3 mg/dL 9.4 9.6 9.2  Total Protein 6.5 - 8.1 g/dL 7.5 - -  Total Bilirubin 0.3 - 1.2 mg/dL 0.9 - -  Alkaline Phos 38 - 126 U/L 189(H) - -  AST 15 - 41 U/L 30 - -  ALT 0 - 44 U/L 16 - -    Pertinent Imaging TTE 11/22/20 1. Left ventricular ejection fraction, by estimation, is 55 to 60%. The  left ventricle has normal function. The left ventricle has no regional  wall motion abnormalities. Left ventricular diastolic parameters were  normal.   2. D-shaped interventricular septum in diastole, suggesting RV volume  overload. Right ventricular systolic function is normal. The right  ventricular size is moderately enlarged. There is normal pulmonary artery  systolic pressure. The estimated right  ventricular systolic pressure is 123456 mmHg.   3. Right atrial size was mildly dilated.   4. The mitral valve is normal in structure. No evidence of mitral valve  regurgitation. No evidence of mitral stenosis.   5. The tricuspid valve is abnormal. Tricuspid valve regurgitation is  severe. Systolic flow reversal in the hepatic vein doppler pattern.   6. The aortic valve is tricuspid. Aortic valve regurgitation is not  visualized. No aortic stenosis is present.   7. The inferior vena cava is dilated in size with >50% respiratory  variability, suggesting right atrial pressure of 8 mmHg.    Assessment/Plan: # RT Tibia osteomyelitis complicated with hardware  -S/p OR on 9/16 with removal of hardware, application of wound vac, excision of the bone, soft tissue, muscle and fascia and placement  of antibiotic beads. OR note reviewed where it was mentioned one screw had broken outside of zone of infection and that was left.  OR cultures growing MRSA.  - Received Vanc from 9/16 until 9/20 when received a dose of Oritavancin on discharge day 9/20. Dalbavancin dose on 9/29 and 11/2 - Last dose of IV dalbavancin on 11/2 and will have effect of abtx until 2 weeks.  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   Patient's labs were reviewed as well as his previous records. Patients questions were addressed and answered.  Electronically signed by:  Odette Fraction, MD Infectious Diseases  Office phone [phone] Fax no. [phone]

## 2020-12-09 NOTE — Progress Notes (Signed)
Cardiology Office Note:    Date:  10/23/2020   ID:  Amber [name], DOB Jan 15, 1990, MRN [id number]  PCP:  [facility]  Cardiologist:  Parke Poisson, MD  Electrophysiologist:  None   Referring MD: [facility]   Chief Complaint/Reason for Referral: Tricuspid valve endocarditis, severe TR, severe RV enlargement.   History of Present Illness:    Amber [name] is a 30 y.o. female with a history of IV drug use and hepatitis C who presented to the hospital in November 2021 for worsening SOB, weakness and fatigue, found obtunded in her car in the ER parking lot. 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 She had a prolonged hospitalization and has received IV antibiotics for treatment of septic shock with MRSA bacteremia with endocarditis.   She is referred by Dr. Cliffton Asters for cardiovascular review of right heart function for timing of eventual tricuspid valve replacement. She is overall well, fatigue greatly improved. She is working and remains active. Class I NYHA symptoms. No CP, SOB, no LE edema. No palpitations, no syncope. She has been able to remain drug use free and is continued on suboxone.   Presents alone today. We reviewed HF consultation and RHC results. Well compensated TR.  Past Medical History:  Diagnosis Date   Complication of anesthesia    Hard to wake up   Endocarditis    Hepatitis    Hep C   Heroin abuse (HCC)    History of kidney stones    Hypertension    MRSA (methicillin resistant staph aureus) culture positive    Ovarian cyst    UTI (lower urinary tract infection)     Past Surgical History:  Procedure Laterality Date   APPLICATION OF ANGIOVAC N/A 12/05/2019   Procedure: APPLICATION OF ANGIOVAC;  Surgeon: Corliss Skains, MD;  Location: [facility] OR;  Service: Vascular;  Laterality: N/A;  Patient will need intraoperative TEE Can be performed in OR 14, 15, or  17 if OR 16 is not available   CARDIAC CATHETERIZATION     FINGER SURGERY     HARDWARE REMOVAL Right 09/28/2020   Procedure: REMOVAL DEEP HARDWARE RIGHT TIBIA;  Surgeon: Nadara Mustard, MD;  Location: [facility] OR;  Service: Orthopedics;  Laterality: Right;   RIGHT HEART CATH N/A 09/25/2020   Procedure: RIGHT HEART CATH;  Surgeon: Laurey Morale, MD;  Location: [facility] INVASIVE CV LAB;  Service: Cardiovascular;  Laterality: N/A;   TEE WITHOUT CARDIOVERSION N/A 11/07/2019   Procedure: TRANSESOPHAGEAL ECHOCARDIOGRAM (TEE);  Surgeon: Wendall Stade, MD;  Location: [facility] ENDOSCOPY;  Service: Cardiovascular;  Laterality: N/A;   TEE WITHOUT CARDIOVERSION  12/05/2019   Procedure: TRANSESOPHAGEAL ECHOCARDIOGRAM (TEE);  Surgeon: Corliss Skains, MD;  Location: [facility] OR;  Service: Vascular;;   TEE WITHOUT CARDIOVERSION N/A 12/05/2019   Procedure: TRANSESOPHAGEAL ECHOCARDIOGRAM (TEE);  Surgeon: Corliss Skains, MD;  Location: [facility] OR;  Service: Thoracic;  Laterality: N/A;    Current Medications: Current Meds  Medication Sig   acetaminophen (TYLENOL) 500 MG tablet Take 1,000 mg by mouth every 6 (six) hours as needed for headache.    buprenorphine-naloxone (SUBOXONE) 8-2 mg SUBL SL tablet Place 2 tablets under the tongue daily.   escitalopram (LEXAPRO) 20 MG tablet Take 1 tablet (20 mg total) by mouth at bedtime.   ibuprofen (ADVIL) 600 MG tablet Take 1 tablet (600 mg total) by mouth every 6 (six) hours as needed.   lidocaine (  LIDODERM) 5 % Place 2 patches onto the skin daily as needed (pain). Remove & Discard patch within 12 hours or as directed by MD   Multiple Vitamin (MULTIVITAMIN WITH MINERALS) TABS tablet Take 1 tablet by mouth daily.   torsemide (DEMADEX) 10 MG tablet Take 1 tablet (10 mg total) by mouth daily as needed (AS NEEDED FOR SHORTNESS OF BREATH OR SWELLING).   [DISCONTINUED] diclofenac Sodium (VOLTAREN) 1 % GEL Apply 4 g topically 4 (four) times daily. (Patient taking differently: Apply 4 g  topically daily as needed (pain).)   [DISCONTINUED] gabapentin (NEURONTIN) 600 MG tablet Take 0.5 tablets (300 mg total) by mouth 3 (three) times daily. (Patient taking differently: Take 600 mg by mouth at bedtime.)     Allergies:   Bee venom and Other   Social History   Tobacco Use   Smoking status: Former    Types: Cigarettes   Smokeless tobacco: Never   Tobacco comments:    Two weeks without smoking  Vaping Use   Vaping Use: Never used  Substance Use Topics   Alcohol use: No   Drug use: Yes    Types: Marijuana, IV    Comment: opiods- pain medication/heroin     Family History: The patient's family history includes Valvular heart disease in her maternal grandmother.  ROS:   Please see the history of present illness.    All other systems reviewed and are negative.  EKGs/Labs/Other Studies Reviewed:    The following studies were reviewed today:  EKG:  06/19/20: SR, T wave abnl anteriorly 04/16/20: Sinus Bradycardia, LAE, T wave abnl anteriorly  Recent Labs: 12/15/2019: Magnesium 1.7 09/18/2020: B Natriuretic Peptide 101.9 09/25/2020: Hemoglobin 13.6; Platelets 170 10/11/2020: ALT 16; BUN 12; Creatinine, Ser 0.80; Potassium 4.4; Sodium 137  Recent Lipid Panel    Component Value Date/Time   TRIG 114 12/06/2019 1700    Physical Exam:    VS:  BP 110/60   Pulse 62   Ht 5\' 1"  (1.549 m)   Wt 102 lb 9.6 oz (46.5 kg)   SpO2 98%   BMI 19.39 kg/m     Wt Readings from Last 5 Encounters:  11/30/20 109 lb 12.8 oz (49.8 kg)  11/22/20 108 lb (49 kg)  10/23/20 102 lb 9.6 oz (46.5 kg)  10/11/20 102 lb (46.3 kg)  10/10/20 101 lb (45.8 kg)    Constitutional: No acute distress Eyes: sclera non-icteric, normal conjunctiva and lids ENMT: normal dentition, moist mucous membranes Cardiovascular: regular rhythm, normal rate, 2/6 holosystolic murmur along sternal border. S1 and S2 normal. Radial pulses normal bilaterally. V wave noted with JVD to angle of jaw supine.  Respiratory:  clear to auscultation bilaterally GI : normal bowel sounds, soft and nontender. No distention.   MSK: extremities warm, well perfused. No edema.  NEURO: grossly nonfocal exam, moves all extremities. PSYCH: alert and oriented x 3, normal mood and affect.   ASSESSMENT:    1. RVF (right ventricular failure) (HCC)   2. Right-sided heart failure, unspecified HF chronicity (HCC)   3. Severe tricuspid regurgitation   4. Endocarditis of tricuspid valve     PLAN:    Reviewed results of HF consultation and RHC. Agree that ID will need to guide on when safe to pursue surgery. Seeing HF in follow up soon with repeat echo.   Not requiring torsemide, continue 10 mg prn swelling or weight gain.    Total time of encounter: 20 minutes total time of encounter, including 15 minutes spent in  face-to-face patient care on the date of this encounter. This time includes coordination of care and counseling regarding above mentioned problem list. Remainder of non-face-to-face time involved reviewing chart documents/testing relevant to the patient encounter and documentation in the medical record. I have independently reviewed documentation from referring provider.   Weston Brass, MD, [facility]  [facility]     Medication Adjustments/Labs and Tests Ordered: Current medicines are reviewed at length with the patient today.  Concerns regarding medicines are outlined above.   No orders of the defined types were placed in this encounter.   No orders of the defined types were placed in this encounter.   Patient Instructions  Medication Instructions:  No Changes In Medications at this time.  *If you need a refill on your cardiac medications before your next appointment, please call your pharmacy*  Follow-Up: At [facility], you and your health needs are our priority.  As part of our continuing mission to provide you with exceptional heart care, we have created designated Provider Care  Teams.  These Care Teams include your primary Cardiologist (physician) and Advanced Practice Providers (APPs -  Physician Assistants and Nurse Practitioners) who all work together to provide you with the care you need, when you need it.  Your next appointment:   6 month(s)  The format for your next appointment:   In Person  Provider:   Weston Brass, MD

## 2020-12-11 ENCOUNTER — Ambulatory Visit (INDEPENDENT_AMBULATORY_CARE_PROVIDER_SITE_OTHER): Payer: Self-pay | Admitting: Infectious Diseases

## 2020-12-11 ENCOUNTER — Other Ambulatory Visit: Payer: Self-pay

## 2020-12-11 ENCOUNTER — Encounter: Payer: Self-pay | Admitting: Infectious Diseases

## 2020-12-11 VITALS — BP 101/68 | HR 67 | Temp 98.1°F | Wt 109.0 lb

## 2020-12-11 DIAGNOSIS — B182 Chronic viral hepatitis C: Secondary | ICD-10-CM

## 2020-12-11 DIAGNOSIS — I079 Rheumatic tricuspid valve disease, unspecified: Secondary | ICD-10-CM

## 2020-12-11 DIAGNOSIS — M869 Osteomyelitis, unspecified: Secondary | ICD-10-CM

## 2020-12-11 NOTE — Progress Notes (Signed)
[facility]                                      [address], [place], [place], [address]                                               Phn. [phone]; Fax: [phone]                                                               Date: 12/11/20  Reason for Follow Up - HFU for RT tibial osteomyelitis    HPI: Amber [name] is a 30 y.o.old female with PMH of IVDU on suboxone,  hepatitis C s/p tx and SVR, MRSA bacteremia with septic pulmonary emboli, TV endocarditis s/p angiovac.who is here for follow up for RT tibial Osteomyelitis.  Doing well. No complaints. Rt leg wound has healed well. OK for CT surgery for valve surgery from ID standpoint. Discussed with her to get a US abdomen with PCP for [facility] screening given Hep c and borderline splenomegaly seen in last [name] on 10/2020.   ROS: 10 point ROS done with pertinent positives and negatives listed above  Past Medical History:  Diagnosis Date   Complication of anesthesia    Hard to wake up   Endocarditis    Hepatitis    Hep C   Heroin abuse ([place])    History of kidney stones    Hypertension    MRSA (methicillin resistant staph aureus) culture positive    Ovarian cyst    UTI (lower urinary tract infection)    Past Surgical History:  Procedure Laterality Date   APPLICATION OF ANGIOVAC N/A 12/05/2019   Procedure: APPLICATION OF ANGIOVAC;  Surgeon: Lajuana Matte, MD;  Location: [place];  Service: Vascular;  Laterality: N/A;  Patient will need intraoperative TEE Can be performed in OR 14, 15, or 17 if OR 16 is not available   CARDIAC CATHETERIZATION     FINGER SURGERY     HARDWARE REMOVAL Right 09/28/2020   Procedure: REMOVAL DEEP HARDWARE RIGHT TIBIA;  Surgeon: Newt Minion, MD;  Location: [place];  Service: Orthopedics;  Laterality: Right;   RIGHT HEART CATH N/A 09/25/2020   Procedure: RIGHT HEART CATH;  Surgeon: Larey Dresser, MD;  Location: [place] CV LAB;  Service: Cardiovascular;  Laterality: N/A;   TEE WITHOUT CARDIOVERSION N/A 11/07/2019   Procedure: TRANSESOPHAGEAL ECHOCARDIOGRAM (TEE);  Surgeon: Josue Hector, MD;  Location: [facility] ENDOSCOPY;  Service: Cardiovascular;  Laterality: N/A;   TEE WITHOUT CARDIOVERSION  12/05/2019   Procedure: TRANSESOPHAGEAL ECHOCARDIOGRAM (TEE);  Surgeon: Lajuana Matte, MD;  Location: [place];  Service: Vascular;;   TEE WITHOUT CARDIOVERSION N/A 12/05/2019   Procedure: TRANSESOPHAGEAL ECHOCARDIOGRAM (TEE);  Surgeon: Lajuana Matte, MD;  Location: [place] OR;  Service: Thoracic;  Laterality: N/A;   Current Outpatient Medications on File Prior to Visit  Medication Sig Dispense Refill   acetaminophen (TYLENOL) 500 MG tablet Take 1,000 mg by mouth every 6 (six) hours as needed  for headache.      buprenorphine-naloxone (SUBOXONE) 8-2 mg SUBL SL tablet Place 2 tablets under the tongue daily.     diclofenac Sodium (VOLTAREN) 1 % GEL Apply 1 application topically as needed.     escitalopram (LEXAPRO) 20 MG tablet Take 1 tablet (20 mg total) by mouth at bedtime. 30 tablet 6   gabapentin (NEURONTIN) 600 MG tablet Take 600 mg by mouth at bedtime.     ibuprofen (ADVIL) 600 MG tablet Take 1 tablet (600 mg total) by mouth every 6 (six) hours as needed. 60 tablet 0   lidocaine (LIDODERM) 5 % Place 2 patches onto the skin daily as needed (pain). Remove & Discard patch within 12 hours or as directed by MD     Multiple Vitamin (MULTIVITAMIN WITH MINERALS) TABS tablet Take 1 tablet by mouth daily. 30 tablet 6   torsemide (DEMADEX) 10 MG tablet Take 1 tablet (10 mg total) by mouth daily as needed (AS NEEDED FOR SHORTNESS OF BREATH OR SWELLING). 30 tablet 3   No current facility-administered medications on file prior to visit.    Allergies  Allergen Reactions   Bee Venom Anaphylaxis   Other Anaphylaxis    Bee sting   Social History   Socioeconomic History   Marital status: Single    Spouse name: Not  on file   Number of children: Not on file   Years of education: Not on file   Highest education level: Not on file  Occupational History   Not on file  Tobacco Use   Smoking status: Former    Types: Cigarettes   Smokeless tobacco: Never   Tobacco comments:    Two weeks without smoking  Vaping Use   Vaping Use: Never used  Substance and Sexual Activity   Alcohol use: No   Drug use: Yes    Types: Marijuana, IV    Comment: opiods- pain medication/heroin   Sexual activity: Yes    Birth control/protection: None  Other Topics Concern   Not on file  Social History Narrative   Not on file   Social Determinants of Health   Financial Resource Strain: Not on file  Food Insecurity: Not on file  Transportation Needs: Not on file  Physical Activity: Not on file  Stress: Not on file  Social Connections: Not on file  Intimate Partner Violence: Not on file   Family History  Problem Relation Age of Onset   Valvular heart disease Maternal Grandmother     Vitals  BP 101/68 (BP Location: Right Arm)   Pulse 67   Temp 98.1 F (36.7 C) (Oral)   Wt 109 lb (49.4 kg)   BMI 20.60 kg/m    Exam  Gen: Alert and oriented x 3, no acute distres HEENT:  no scleral icterus, no pale conjunctivae, hearing normal, oral mucosa moist Neck: Supple Cardio: Regular rate and rhythm Resp: Clear air entry  GI: Soft, nontender Extremities:  RT leg wound has healed  Skin: No rashes, lesions, or ecchymoses Neuro: grossly non focal  Psych: Calm, cooperative  Laboratory  CBC Latest Ref Rng & Units 09/25/2020 09/25/2020 09/25/2020  WBC 4.0 - 10.5 K/uL - - 4.0  Hemoglobin 12.0 - 15.0 g/dL 13.6 14.3 14.2  Hematocrit 36.0 - 46.0 % 40.0 42.0 44.0  Platelets 150 - 400 K/uL - - 170   CMP Latest Ref Rng & Units 10/11/2020 09/29/2020 09/28/2020  Glucose 70 - 99 mg/dL 93 144(H) 106(H)  BUN 6 - 20 mg/dL 12 16  10  Creatinine 0.44 - 1.00 mg/dL 2.29 7.98 9.21  Sodium 135 - 145 mmol/L 137 135 137  Potassium 3.5 -  5.1 mmol/L 4.4 4.1 4.1  Chloride 98 - 111 mmol/L 105 101 105  CO2 22 - 32 mmol/L 23 24 24   Calcium 8.9 - 10.3 mg/dL 9.4 9.6 9.2  Total Protein 6.5 - 8.1 g/dL 7.5 - -  Total Bilirubin 0.3 - 1.2 mg/dL 0.9 - -  Alkaline Phos 38 - 126 U/L 189(H) - -  AST 15 - 41 U/L 30 - -  ALT 0 - 44 U/L 16 - -    Pertinent Imaging TTE 11/22/20 1. Left ventricular ejection fraction, by estimation, is 55 to 60%. The  left ventricle has normal function. The left ventricle has no regional  wall motion abnormalities. Left ventricular diastolic parameters were  normal.   2. D-shaped interventricular septum in diastole, suggesting RV volume  overload. Right ventricular systolic function is normal. The right  ventricular size is moderately enlarged. There is normal pulmonary artery  systolic pressure. The estimated right  ventricular systolic pressure is 26.3 mmHg.   3. Right atrial size was mildly dilated.   4. The mitral valve is normal in structure. No evidence of mitral valve  regurgitation. No evidence of mitral stenosis.   5. The tricuspid valve is abnormal. Tricuspid valve regurgitation is  severe. Systolic flow reversal in the hepatic vein doppler pattern.   6. The aortic valve is tricuspid. Aortic valve regurgitation is not  visualized. No aortic stenosis is present.   7. The inferior vena cava is dilated in size with >50% respiratory  variability, suggesting right atrial pressure of 8 mmHg.    Assessment/Plan: # RT Tibia osteomyelitis complicated with hardware  -S/p OR on 9/16 with removal of hardware, application of wound vac, excision of the bone, soft tissue, muscle and fascia and placement of antibiotic beads. OR note reviewed where it was mentioned one screw had broken outside of zone of infection and that was left.  OR cultures growing MRSA.  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   Patient's labs were reviewed as well as his previous records. Patients questions were addressed and answered.   Electronically signed by:  12/2019, MD Infectious Diseases  Office phone [phone] Fax no. [phone]

## 2020-12-13 ENCOUNTER — Encounter (HOSPITAL_COMMUNITY): Payer: Self-pay | Admitting: Internal Medicine

## 2020-12-14 ENCOUNTER — Other Ambulatory Visit: Payer: Self-pay

## 2020-12-14 ENCOUNTER — Encounter: Payer: Self-pay | Admitting: *Deleted

## 2020-12-14 ENCOUNTER — Ambulatory Visit (INDEPENDENT_AMBULATORY_CARE_PROVIDER_SITE_OTHER): Payer: Self-pay | Admitting: Thoracic Surgery (Cardiothoracic Vascular Surgery)

## 2020-12-14 ENCOUNTER — Other Ambulatory Visit: Payer: Self-pay | Admitting: *Deleted

## 2020-12-14 VITALS — BP 102/68 | HR 70 | Resp 20 | Ht 61.0 in | Wt 106.0 lb

## 2020-12-14 DIAGNOSIS — I079 Rheumatic tricuspid valve disease, unspecified: Secondary | ICD-10-CM

## 2020-12-14 DIAGNOSIS — I361 Nonrheumatic tricuspid (valve) insufficiency: Secondary | ICD-10-CM

## 2020-12-14 NOTE — Progress Notes (Signed)
[address]       [place] [address]             [phone]        Amber [name] [facility] Medical Record #[id number] Date of Birth: [birth year]  Referring: Amber Morale, MD Primary Care: Center, [facility] Primary Cardiologist:Amber Wynell Balloon, MD  Chief Complaint:    Chief Complaint  Patient presents with   Tricuspid Regurgitation    Surgical consult, ECHO 11/22/20, Cardiac Cath 09/25/20    History of Present Illness:     [name] comes in in follow-up.  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  She had severe tricuspid valve regurgitation prior to the angio vac procedure and this is persisted.  Her original procedure was in November 2021.  Since that point she has remained sober and off of IV drugs.  Her family continues to show strong support.  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  She recently underwent a metal rod in her tibia and has had good healing to this wound.  From a cardiovascular standpoint she now does admit to be short of breath with minimal activity.  Her echocardiogram from November 2022 shows that she continues to have severe tricuspid valve regurgitation with systolic reversal of flow within the hepatic veins.  She also has some evidence of right ventricular overload.  Her systolic function does remain normal but the ventricle is moderately enlarged.  Her alkaline phosphatase is elevated at 189.  Her bilirubin is normal at 0.9   Past Medical and Surgical History: Previous Chest Surgery: No Previous Chest Radiation: No Diabetes Mellitus: No.  HbA1C pending Creatinine: 0.8  Past Medical History:  Diagnosis Date   Complication of anesthesia    Hard to wake up   Endocarditis    Hepatitis    Hep C    Heroin abuse (HCC)    History of kidney stones    Hypertension    MRSA (methicillin resistant staph aureus) culture positive    Ovarian cyst    UTI (lower urinary tract infection)     Past Surgical History:  Procedure Laterality Date   APPLICATION OF ANGIOVAC N/A 12/05/2019   Procedure: APPLICATION OF ANGIOVAC;  Surgeon: Corliss Skains, MD;  Location: [facility] OR;  Service: Vascular;  Laterality: N/A;  Patient will need intraoperative TEE Can be performed in OR 14, 15, or 17 if OR 16 is not available   CARDIAC CATHETERIZATION     FINGER SURGERY     HARDWARE REMOVAL Right 09/28/2020   Procedure: REMOVAL DEEP HARDWARE RIGHT TIBIA;  Surgeon: Nadara Mustard, MD;  Location: [facility] OR;  Service: Orthopedics;  Laterality: Right;   RIGHT HEART CATH N/A 09/25/2020   Procedure: RIGHT HEART CATH;  Surgeon: Amber Morale, MD;  Location: [facility] INVASIVE CV LAB;  Service: Cardiovascular;  Laterality: N/A;   TEE WITHOUT CARDIOVERSION N/A 11/07/2019   Procedure: TRANSESOPHAGEAL ECHOCARDIOGRAM (TEE);  Surgeon: Wendall Stade, MD;  Location: [facility] ENDOSCOPY;  Service: Cardiovascular;  Laterality: N/A;   TEE WITHOUT CARDIOVERSION  12/05/2019   Procedure: TRANSESOPHAGEAL ECHOCARDIOGRAM (TEE);  Surgeon: Corliss Skains, MD;  Location: [facility] OR;  Service: Vascular;;   TEE WITHOUT CARDIOVERSION N/A 12/05/2019   Procedure: TRANSESOPHAGEAL ECHOCARDIOGRAM (TEE);  Surgeon: Corliss Skains, MD;  Location: [facility] OR;  Service: Thoracic;  Laterality: N/A;    Social History: Support: Lives with her mother  Social History   Tobacco Use  Smoking Status Former   Types: Cigarettes  Smokeless Tobacco Never    Social History   Substance and Sexual Activity  Alcohol Use No     Allergies  Allergen Reactions   Bee Venom Anaphylaxis   Other Anaphylaxis    Bee sting     Current Outpatient Medications  Medication Sig Dispense Refill   acetaminophen (TYLENOL) 500 MG tablet Take 1,000 mg by mouth every 6  (six) hours as needed for headache.      buprenorphine-naloxone (SUBOXONE) 8-2 mg SUBL SL tablet Place 2 tablets under the tongue daily.     diclofenac Sodium (VOLTAREN) 1 % GEL Apply 1 application topically as needed.     escitalopram (LEXAPRO) 20 MG tablet Take 1 tablet (20 mg total) by mouth at bedtime. 30 tablet 6   gabapentin (NEURONTIN) 600 MG tablet Take 600 mg by mouth at bedtime.     ibuprofen (ADVIL) 600 MG tablet Take 1 tablet (600 mg total) by mouth every 6 (six) hours as needed. 60 tablet 0   lidocaine (LIDODERM) 5 % Place 2 patches onto the skin daily as needed (pain). Remove & Discard patch within 12 hours or as directed by MD     Multiple Vitamin (MULTIVITAMIN WITH MINERALS) TABS tablet Take 1 tablet by mouth daily. 30 tablet 6   torsemide (DEMADEX) 10 MG tablet Take 1 tablet (10 mg total) by mouth daily as needed (AS NEEDED FOR SHORTNESS OF BREATH OR SWELLING). 30 tablet 3   No current facility-administered medications for this visit.    (Not in a hospital admission)   Family History  Problem Relation Age of Onset   Valvular heart disease Maternal Grandmother      Review of Systems:   Review of Systems  Constitutional:  Positive for malaise/fatigue.  Respiratory:  Positive for shortness of breath.   Cardiovascular:  Negative for chest pain.  Gastrointestinal:  Negative for abdominal pain.     Physical Exam: BP 102/68   Pulse 70   Resp 20   Ht 5\' 1"  (1.549 m)   Wt 106 lb (48.1 kg)   SpO2 96% Comment: RA  BMI 20.03 kg/m  Physical Exam Constitutional:      General: She is not in acute distress.    Appearance: Normal appearance. She is normal weight. She is not ill-appearing.  HENT:     Head: Normocephalic and atraumatic.  Eyes:     Extraocular Movements: Extraocular movements intact.  Cardiovascular:     Rate and Rhythm: Normal rate.  Pulmonary:     Effort: Pulmonary effort is normal.  Abdominal:     General: Abdomen is flat. There is no distension.   Musculoskeletal:        General: Normal range of motion.     Cervical back: Normal range of motion.  Skin:    General: Skin is warm and dry.  Neurological:     General: No focal deficit present.     Mental Status: She is alert and oriented to person, place, and time.      Diagnostic Studies & Laboratory data:     Echo: IMPRESSIONS     1. Left ventricular ejection fraction, by estimation, is 55 to 60%. The  left ventricle has normal function. The left ventricle has no regional  wall  motion abnormalities. Left ventricular diastolic parameters were  normal.   2. D-shaped interventricular septum in diastole, suggesting RV volume  overload. Right ventricular systolic function is normal. The right  ventricular size is moderately enlarged. There is normal pulmonary artery  systolic pressure. The estimated right  ventricular systolic pressure is 26.3 mmHg.   3. Right atrial size was mildly dilated.   4. The mitral valve is normal in structure. No evidence of mitral valve  regurgitation. No evidence of mitral stenosis.   5. The tricuspid valve is abnormal. Tricuspid valve regurgitation is  severe. Systolic flow reversal in the hepatic vein doppler pattern.   6. The aortic valve is tricuspid. Aortic valve regurgitation is not  visualized. No aortic stenosis is present.   7. The inferior vena cava is dilated in size with >50% respiratory  variability, suggesting right atrial pressure of 8 mmHg.   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  She does have a history of IV drug abuse but has had strong family support has remained clean for over a year.  On review of her echocardiogram she does have some RV overload and elevation alkaline phosphatase and INR.  We discussed the risks and benefits of surgical repair of her tricuspid valve and she is agreeable to proceed.  The plan is to use autologous pericardium, with an annuloplasty ring.  If this is unsuccessful then she will undergo a bioprosthetic valve replacement.  We discussed the risks and benefits of the procedure which include but need for permanent pacemaker.  She is tentatively scheduled for early January.     I  spent 15 minutes counseling the patient face to face.   Corliss Skains 12/14/2020 4:19 PM

## 2020-12-17 ENCOUNTER — Encounter (HOSPITAL_COMMUNITY): Payer: Self-pay | Admitting: Internal Medicine

## 2021-01-01 ENCOUNTER — Encounter: Payer: Self-pay | Admitting: Internal Medicine

## 2021-01-08 ENCOUNTER — Encounter (HOSPITAL_COMMUNITY): Payer: Self-pay | Admitting: Internal Medicine

## 2021-01-10 NOTE — Progress Notes (Addendum)
Surgical Instructions    Your procedure is scheduled on 01/17/21.  Report to [facility] Main Entrance "A" at 5:30 A.M., then check in with the Admitting office.  Call this number if you have problems the morning of surgery:  [phone]   If you have any questions prior to your surgery date call [phone]: Open Monday-Friday 8am-4pm    Remember:  Do not eat or drink after midnight the night before your surgery     Take these medicines the morning of surgery with A SIP OF WATER: acetaminophen (TYLENOL) - if needed  Follow your doctors instructions on when or if to stop Suboxone.   As of today, STOP taking any Aspirin (unless otherwise instructed by your surgeon) Aleve, Naproxen, Ibuprofen, Motrin, Advil, Goody's, BC's, all herbal medications, fish oil, and all vitamins.   After your COVID test   You are not required to quarantine however you are required to wear a well-fitting mask when you are out and around people not in your household.  If your mask becomes wet or soiled, replace with a new one.  Wash your hands often with soap and water for 20 seconds or clean your hands with an alcohol-based hand sanitizer that contains at least 60% alcohol.  Do not share personal items.  Notify your provider: if you are in close contact with someone who has COVID  or if you develop a fever of 100.4 or greater, sneezing, cough, sore throat, shortness of breath or body aches.           Do not wear jewelry or makeup Do not wear lotions, powders, perfumes or deodorant. Do not shave 48 hours prior to surgery.   Do not bring valuables to the hospital. DO Not wear nail polish, gel polish, artificial nails, or any other type of covering on natural nails including finger and toenails. If patients have artificial nails, gel coating, etc. that need to be removed by a nail salon, please have this removed prior to surgery or surgery may need to be canceled/delayed if the surgeon/ anesthesia feels  like the patient is unable to be adequately monitored.             [facility] is not responsible for any belongings or valuables.  Do NOT Smoke (Tobacco/Vaping)  24 hours prior to your procedure  If you use a CPAP at night, you may bring your mask for your overnight stay.   Contacts, glasses, hearing aids, dentures or partials may not be worn into surgery, please bring cases for these belongings   For patients admitted to the hospital, discharge time will be determined by your treatment team.   Patients discharged the day of surgery will not be allowed to drive home, and someone needs to stay with them for 24 hours.  NO VISITORS WILL BE ALLOWED IN PRE-OP WHERE PATIENTS ARE PREPPED FOR SURGERY.  ONLY 1 SUPPORT PERSON MAY BE PRESENT IN THE WAITING ROOM WHILE YOU ARE IN SURGERY.  IF YOU ARE TO BE ADMITTED, ONCE YOU ARE IN YOUR ROOM YOU WILL BE ALLOWED TWO (2) VISITORS. 1 (ONE) VISITOR MAY STAY OVERNIGHT BUT MUST ARRIVE TO THE ROOM BY 8pm.  Minor children may have two parents present. Special consideration for safety and communication needs will be reviewed on a case by case basis.  Special instructions:    Oral Hygiene is also important to reduce your risk of infection.  Remember - BRUSH YOUR TEETH THE MORNING OF SURGERY WITH YOUR REGULAR TOOTHPASTE  [facility]- Preparing For Surgery  Before surgery, you can play an important role. Because skin is not sterile, your skin needs to be as free of germs as possible. You can reduce the number of germs on your skin by washing with CHG (chlorahexidine gluconate) Soap before surgery.  CHG is an antiseptic cleaner which kills germs and bonds with the skin to continue killing germs even after washing.     Please do not use if you have an allergy to CHG or antibacterial soaps. If your skin becomes reddened/irritated stop using the CHG.  Do not shave (including legs and underarms) for at least 48 hours prior to first CHG shower. It is OK to shave your  face.  Please follow these instructions carefully.     Shower the NIGHT BEFORE SURGERY and the MORNING OF SURGERY with CHG Soap.   If you chose to wash your hair, wash your hair first as usual with your normal shampoo. After you shampoo, rinse your hair and body thoroughly to remove the shampoo.  Then [employer] and genitals (private parts) with your normal soap and rinse thoroughly to remove soap.  After that Use CHG Soap as you would any other liquid soap. You can apply CHG directly to the skin and wash gently with a scrungie or a clean washcloth.   Apply the CHG Soap to your body ONLY FROM THE NECK DOWN.  Do not use on open wounds or open sores. Avoid contact with your eyes, ears, mouth and genitals (private parts). Wash Face and genitals (private parts)  with your normal soap.   Wash thoroughly, paying special attention to the area where your surgery will be performed.  Thoroughly rinse your body with warm water from the neck down.  DO NOT shower/wash with your normal soap after using and rinsing off the CHG Soap.  Pat yourself dry with a CLEAN TOWEL.  Wear CLEAN PAJAMAS to bed the night before surgery  Place CLEAN SHEETS on your bed the night before your surgery  DO NOT SLEEP WITH PETS.   Day of Surgery:  Take a shower with CHG soap. Wear Clean/Comfortable clothing the morning of surgery Do not apply any deodorants/lotions.   Remember to brush your teeth WITH YOUR REGULAR TOOTHPASTE.   Please read over the following fact sheets that you were given.

## 2021-01-15 ENCOUNTER — Ambulatory Visit (HOSPITAL_COMMUNITY)
Admission: RE | Admit: 2021-01-15 | Discharge: 2021-01-15 | Disposition: A | Discharge status: Home / Self Care | Payer: Medicaid Other | Source: Ambulatory Visit | Attending: Thoracic Surgery (Cardiothoracic Vascular Surgery) | Admitting: Thoracic Surgery (Cardiothoracic Vascular Surgery)

## 2021-01-15 ENCOUNTER — Encounter (HOSPITAL_COMMUNITY): Payer: Self-pay

## 2021-01-15 ENCOUNTER — Encounter (HOSPITAL_COMMUNITY)
Admission: RE | Admit: 2021-01-15 | Discharge: 2021-01-15 | Disposition: A | Discharge status: Home / Self Care | Payer: Medicaid Other | Source: Ambulatory Visit | Attending: Thoracic Surgery (Cardiothoracic Vascular Surgery) | Admitting: Thoracic Surgery (Cardiothoracic Vascular Surgery)

## 2021-01-15 ENCOUNTER — Other Ambulatory Visit: Payer: Self-pay

## 2021-01-15 VITALS — BP 116/82 | HR 81 | Temp 98.4°F | Resp 17 | Ht 61.0 in | Wt 106.0 lb

## 2021-01-15 DIAGNOSIS — Z01818 Encounter for other preprocedural examination: Secondary | ICD-10-CM

## 2021-01-15 DIAGNOSIS — I361 Nonrheumatic tricuspid (valve) insufficiency: Secondary | ICD-10-CM | POA: Insufficient documentation

## 2021-01-15 DIAGNOSIS — Z20822 Contact with and (suspected) exposure to covid-19: Secondary | ICD-10-CM | POA: Insufficient documentation

## 2021-01-15 DIAGNOSIS — I079 Rheumatic tricuspid valve disease, unspecified: Secondary | ICD-10-CM

## 2021-01-15 HISTORY — DX: Anxiety disorder, unspecified: F41.9

## 2021-01-15 HISTORY — DX: Depression, unspecified: F32.A

## 2021-01-15 LAB — CBC
HCT: 43.3 % (ref 36.0–46.0)
Hemoglobin: 14.3 g/dL (ref 12.0–15.0)
MCH: 28.1 pg (ref 26.0–34.0)
MCHC: 33 g/dL (ref 30.0–36.0)
MCV: 85.2 fL (ref 80.0–100.0)
Platelets: 191 10*3/uL (ref 150–400)
RBC: 5.08 MIL/uL (ref 3.87–5.11)
RDW: 14.7 % (ref 11.5–15.5)
WBC: 6.6 10*3/uL (ref 4.0–10.5)
nRBC: 0 % (ref 0.0–0.2)

## 2021-01-15 LAB — COMPREHENSIVE METABOLIC PANEL
ALT: 14 U/L (ref 0–44)
AST: 23 U/L (ref 15–41)
Albumin: 4 g/dL (ref 3.5–5.0)
Alkaline Phosphatase: 108 U/L (ref 38–126)
Anion gap: 7 (ref 5–15)
BUN: 7 mg/dL (ref 6–20)
CO2: 21 mmol/L — ABNORMAL LOW (ref 22–32)
Calcium: 9.3 mg/dL (ref 8.9–10.3)
Chloride: 107 mmol/L (ref 98–111)
Creatinine, Ser: 0.7 mg/dL (ref 0.44–1.00)
GFR, Estimated: >60 mL/min (ref >60)
Glucose, Bld: 96 mg/dL (ref 70–99)
Potassium: 4.1 mmol/L (ref 3.5–5.1)
Sodium: 135 mmol/L (ref 135–145)
Total Bilirubin: 1 mg/dL (ref 0.3–1.2)
Total Protein: 7.1 g/dL (ref 6.5–8.1)

## 2021-01-15 LAB — PROTIME-INR
INR: 1.1 (ref 0.8–1.2)
Prothrombin Time: 14.4 seconds (ref 11.4–15.2)

## 2021-01-15 LAB — SURGICAL PCR SCREEN
MRSA, PCR: NEGATIVE
Staphylococcus aureus: NEGATIVE

## 2021-01-15 LAB — HEMOGLOBIN A1C
Hgb A1c MFr Bld: 5.6 % (ref 4.8–5.6)
Mean Plasma Glucose: 114.02 mg/dL

## 2021-01-15 LAB — URINALYSIS, ROUTINE W REFLEX MICROSCOPIC
Bilirubin Urine: NEGATIVE
Glucose, UA: NEGATIVE mg/dL
Hgb urine dipstick: NEGATIVE
Ketones, ur: NEGATIVE mg/dL
Leukocytes,Ua: NEGATIVE
Nitrite: NEGATIVE
Protein, ur: NEGATIVE mg/dL
Specific Gravity, Urine: 1.01 (ref 1.005–1.030)
pH: 6 (ref 5.0–8.0)

## 2021-01-15 LAB — BLOOD GAS, ARTERIAL
Acid-base deficit: 2.1 mmol/L — ABNORMAL HIGH (ref 0.0–2.0)
Bicarbonate: 22.1 mmol/L (ref 20.0–28.0)
FIO2: 21
O2 Saturation: 99.1 %
Patient temperature: 37
pCO2 arterial: 37.6 mmHg (ref 32.0–48.0)
pH, Arterial: 7.388 (ref 7.350–7.450)
pO2, Arterial: 129 mmHg — ABNORMAL HIGH (ref 83.0–108.0)

## 2021-01-15 LAB — APTT: aPTT: 33 seconds (ref 24–36)

## 2021-01-15 IMAGING — DX DG CHEST 2V
2 series · 2 of 2 positions shown · non-contrast
Comparison: [DATE]

CLINICAL DATA: Nonrheumatic tricuspid valve regurgitation,
Endocarditis of tricuspid valve Scheduled to have surgery [DATE]

EXAM:
CHEST - 2 VIEW

[w chest pa]
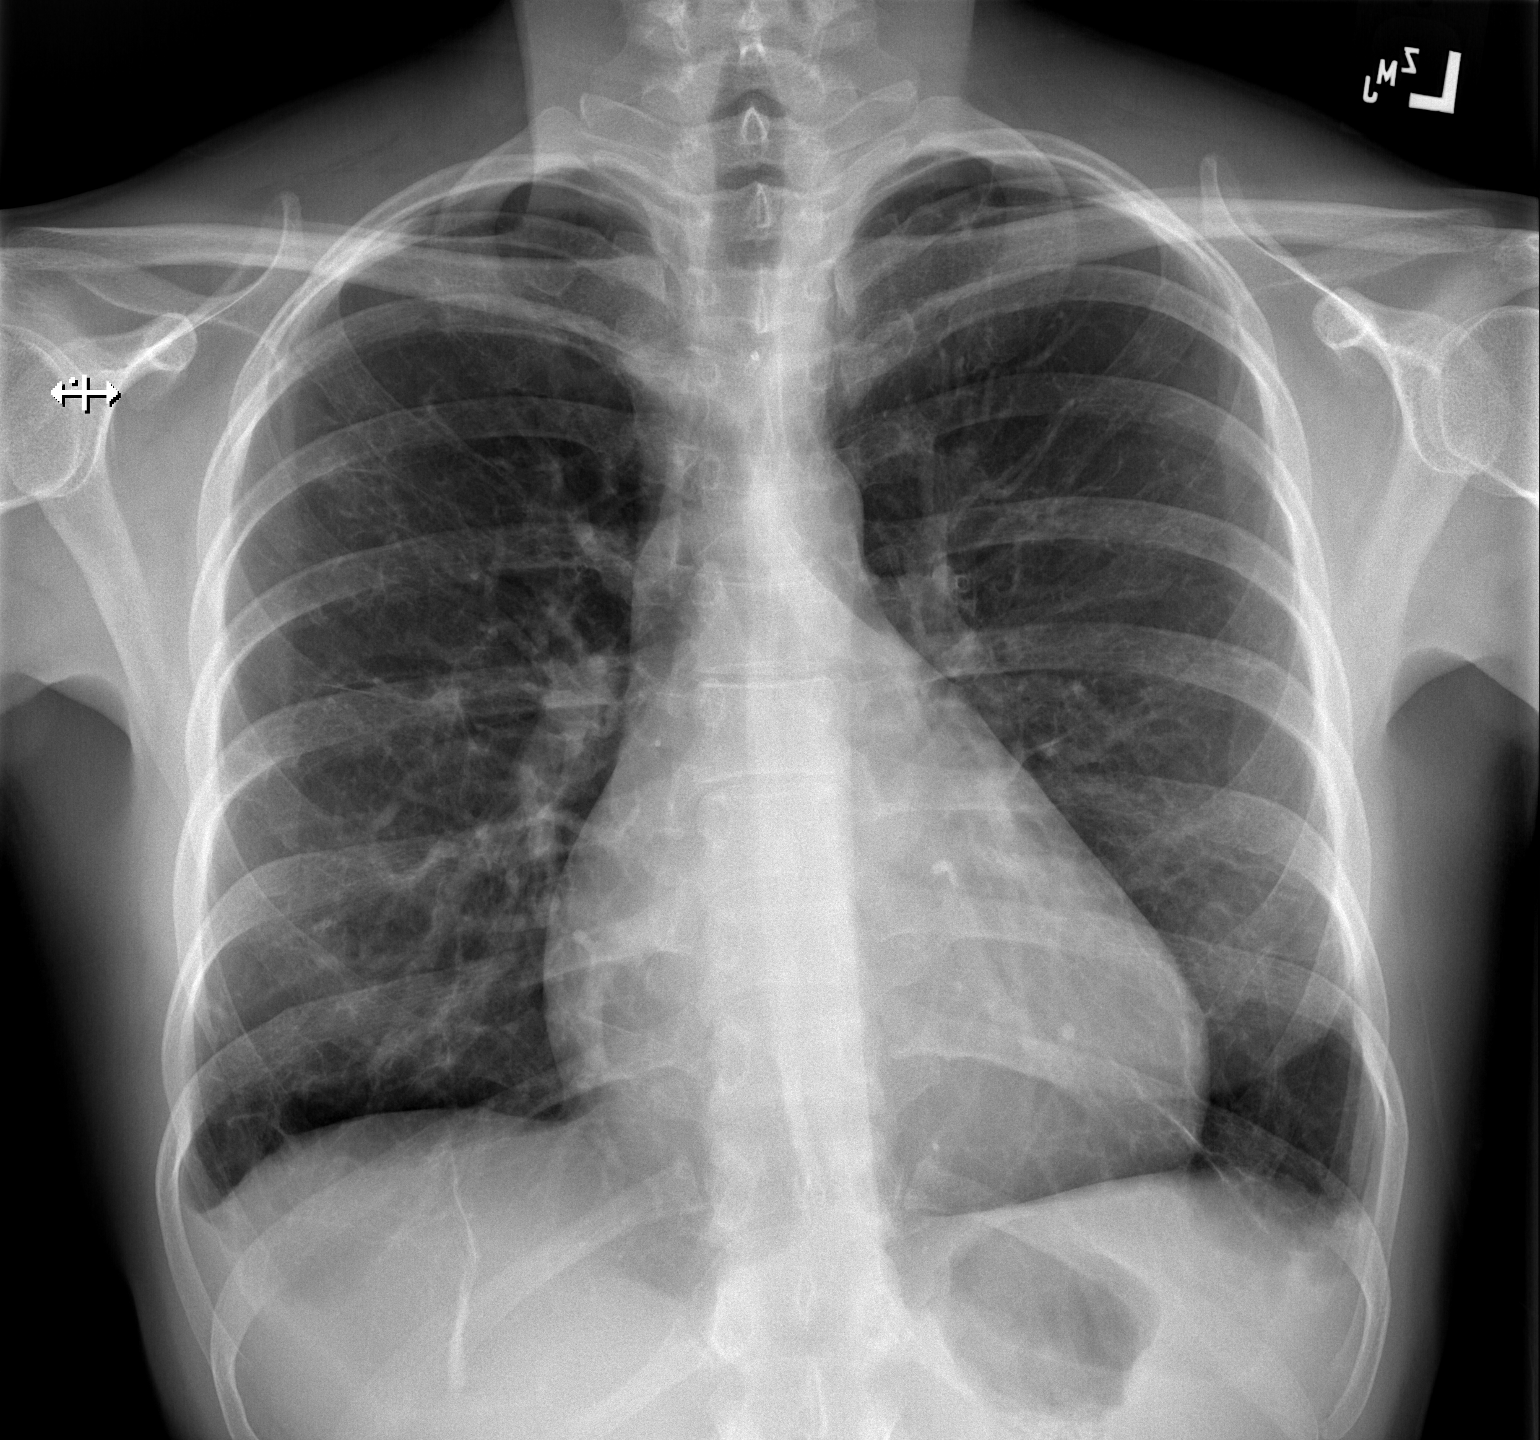

[w chest lat]
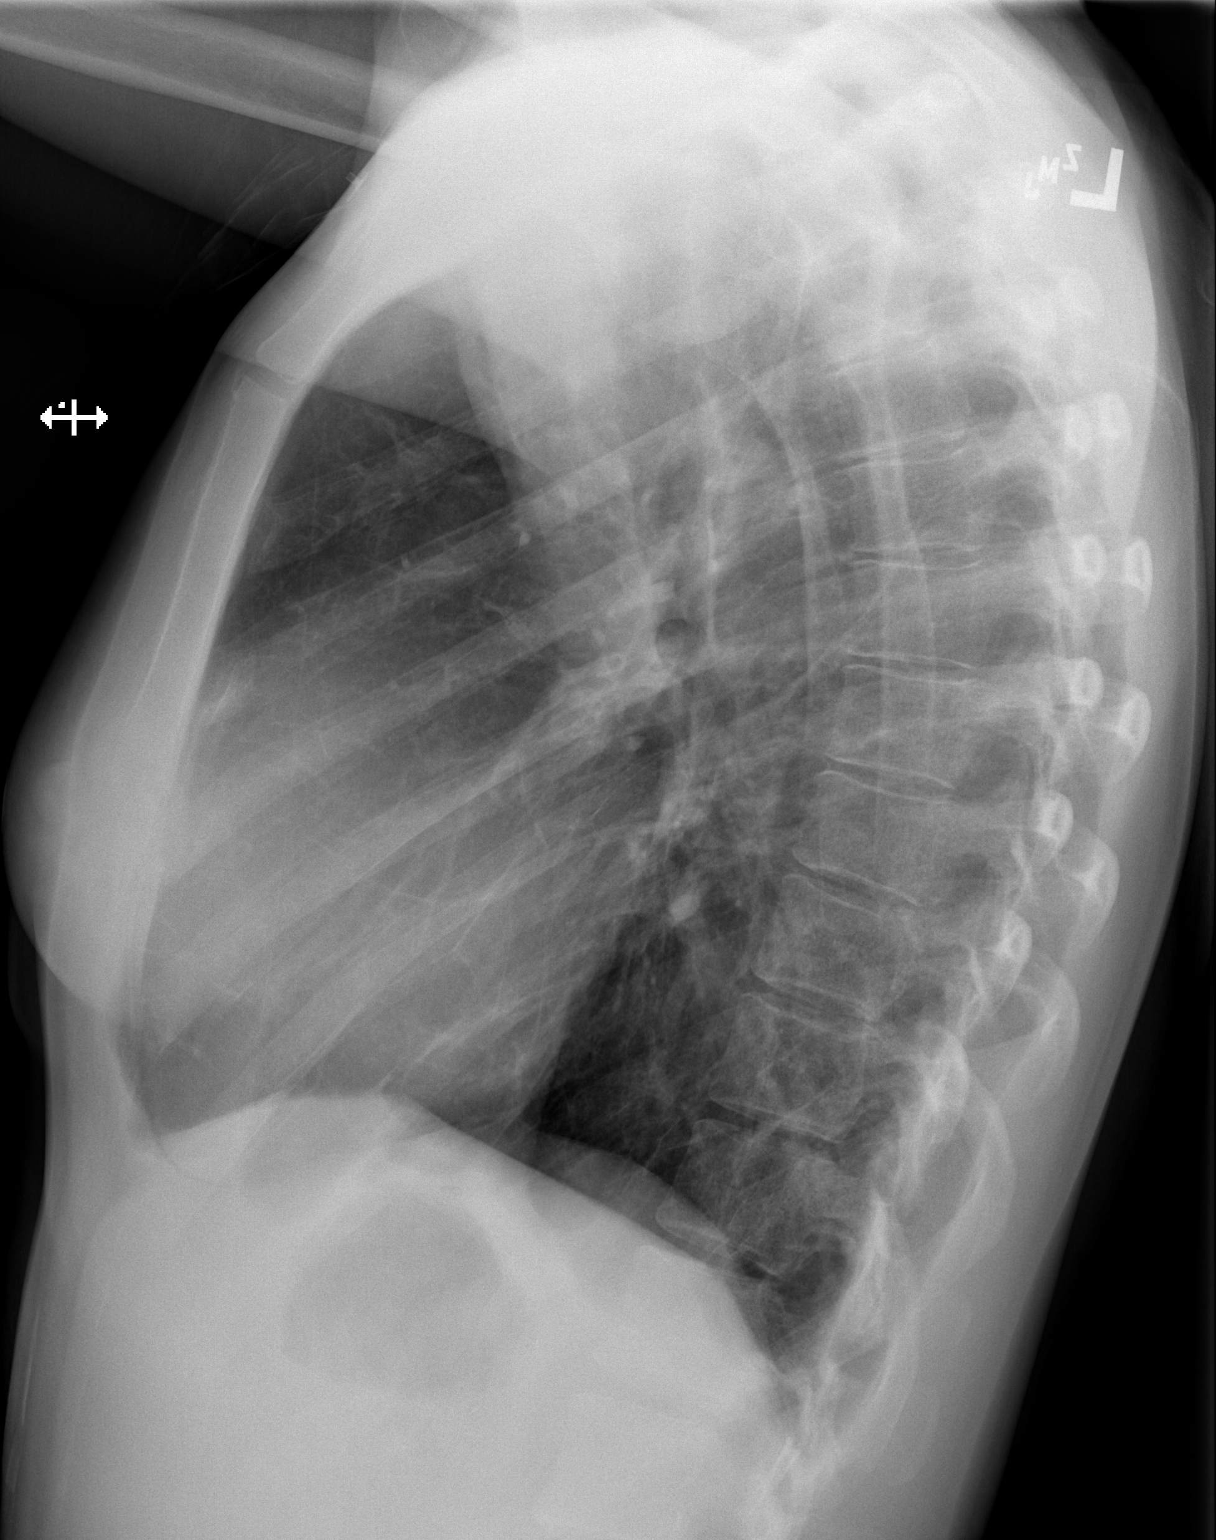

[2 of 2 positions shown; findings below may reference images not displayed]

FINDINGS: The left pleural effusion has resolved. Improved aeration at the
left lung base. Minimal linear scarring or subsegmental atelectasis
at both lung bases. No new infiltrate or edema. PICC line has been
removed.

Heart size and mediastinal contours are within normal limits.

Visualized bones unremarkable.
IMPRESSION: 1. Resolution of left pleural effusion with improved aeration in
both lung bases.

## 2021-01-15 NOTE — Progress Notes (Signed)
PCP - [facility]- pt usually sees Ranae Plumber, [place] Cardiologist - Dr. Cherlynn Kaiser  PPM/ICD - denies   Chest x-ray - 01/15/21 at PAT EKG - 01/15/21 at PAT Stress Test - denies ECHO - 11/22/20 Cardiac Cath - 09/25/20  Sleep Study - denies   DM- denies  Blood Thinner Instructions: n/a Aspirin Instructions: n/a  ERAS Protcol - no, NPO   COVID TEST- 01/15/21 at PAT   Anesthesia review: yes, cardiac hx  Patient denies shortness of breath, fever, cough and chest pain at PAT appointment   All instructions explained to the patient, with a verbal understanding of the material. Patient agrees to go over the instructions while at home for a better understanding. Patient also instructed to wear a mask in public after being tested for COVID-19. The opportunity to ask questions was provided.

## 2021-01-15 NOTE — Progress Notes (Addendum)
Surgical Instructions    Your procedure is scheduled on Thursday 01/17/21.  Report to [facility] Main Entrance "A" at 5:30 A.M., then check in with the Admitting office.  Call this number if you have problems the morning of surgery:  [phone]   If you have any questions prior to your surgery date call [phone]: Open Monday-Friday 8am-4pm    Remember:  Do not eat or drink after midnight the night before your surgery     Take these medicines the morning of surgery with A SIP OF WATER: acetaminophen (TYLENOL) - if needed amoxicillin (AMOXIL)- if course not finished   Follow your doctors instructions on when or if to stop Suboxone. If no instructions given, stop today.   As of today, STOP taking any Aspirin (unless otherwise instructed by your surgeon) Aleve, Naproxen, Ibuprofen, Motrin, Advil, Goody's, BC's, all herbal medications, fish oil, and all vitamins.   After your COVID test   You are not required to quarantine however you are required to wear a well-fitting mask when you are out and around people not in your household.  If your mask becomes wet or soiled, replace with a new one.  Wash your hands often with soap and water for 20 seconds or clean your hands with an alcohol-based hand sanitizer that contains at least 60% alcohol.  Do not share personal items.  Notify your provider: if you are in close contact with someone who has COVID  or if you develop a fever of 100.4 or greater, sneezing, cough, sore throat, shortness of breath or body aches.           Do not wear jewelry or makeup Do not wear lotions, powders, perfumes or deodorant. Do not shave 48 hours prior to surgery.   Do not bring valuables to the hospital. DO Not wear nail polish, gel polish, artificial nails, or any other type of covering on natural nails including finger and toenails. If patients have artificial nails, gel coating, etc. that need to be removed by a nail salon, please have this  removed prior to surgery or surgery may need to be canceled/delayed if the surgeon/ anesthesia feels like the patient is unable to be adequately monitored.              is not responsible for any belongings or valuables.  Do NOT Smoke (Tobacco/Vaping)  24 hours prior to your procedure  If you use a CPAP at night, you may bring your mask for your overnight stay.   Contacts, glasses, hearing aids, dentures or partials may not be worn into surgery, please bring cases for these belongings   For patients admitted to the hospital, discharge time will be determined by your treatment team.   Patients discharged the day of surgery will not be allowed to drive home, and someone needs to stay with them for 24 hours.  NO VISITORS WILL BE ALLOWED IN PRE-OP WHERE PATIENTS ARE PREPPED FOR SURGERY.  ONLY 1 SUPPORT PERSON MAY BE PRESENT IN THE WAITING ROOM WHILE YOU ARE IN SURGERY.  IF YOU ARE TO BE ADMITTED, ONCE YOU ARE IN YOUR ROOM YOU WILL BE ALLOWED TWO (2) VISITORS. 1 (ONE) VISITOR MAY STAY OVERNIGHT BUT MUST ARRIVE TO THE ROOM BY 8pm.  Minor children may have two parents present. Special consideration for safety and communication needs will be reviewed on a case by case basis.  Special instructions:    Oral Hygiene is also important to reduce your risk of infection.  Remember - BRUSH YOUR TEETH THE MORNING OF SURGERY WITH YOUR REGULAR TOOTHPASTE   - Preparing For Surgery  Before surgery, you can play an important role. Because skin is not sterile, your skin needs to be as free of germs as possible. You can reduce the number of germs on your skin by washing with CHG (chlorahexidine gluconate) Soap before surgery.  CHG is an antiseptic cleaner which kills germs and bonds with the skin to continue killing germs even after washing.     Please do not use if you have an allergy to CHG or antibacterial soaps. If your skin becomes reddened/irritated stop using the CHG.  Do not shave  (including legs and underarms) for at least 48 hours prior to first CHG shower. It is OK to shave your face.  Please follow these instructions carefully.     Shower the NIGHT BEFORE SURGERY and the MORNING OF SURGERY with CHG Soap.   If you chose to wash your hair, wash your hair first as usual with your normal shampoo. After you shampoo, rinse your hair and body thoroughly to remove the shampoo.  Then [employer] and genitals (private parts) with your normal soap and rinse thoroughly to remove soap.  After that Use CHG Soap as you would any other liquid soap. You can apply CHG directly to the skin and wash gently with a scrungie or a clean washcloth.   Apply the CHG Soap to your body ONLY FROM THE NECK DOWN.  Do not use on open wounds or open sores. Avoid contact with your eyes, ears, mouth and genitals (private parts). Wash Face and genitals (private parts)  with your normal soap.   Wash thoroughly, paying special attention to the area where your surgery will be performed.  Thoroughly rinse your body with warm water from the neck down.  DO NOT shower/wash with your normal soap after using and rinsing off the CHG Soap.  Pat yourself dry with a CLEAN TOWEL.  Wear CLEAN PAJAMAS to bed the night before surgery  Place CLEAN SHEETS on your bed the night before your surgery  DO NOT SLEEP WITH PETS.   Day of Surgery:  Take a shower with CHG soap. Wear Clean/Comfortable clothing the morning of surgery Do not apply any deodorants/lotions.   Remember to brush your teeth WITH YOUR REGULAR TOOTHPASTE.   Please read over the following fact sheets that you were given.

## 2021-01-16 LAB — SARS CORONAVIRUS 2 (TAT 6-24 HRS): SARS Coronavirus 2: NEGATIVE

## 2021-01-16 MED ORDER — CEFAZOLIN SODIUM-DEXTROSE 2-4 GM/100ML-% IV SOLN
2.0000 g | INTRAVENOUS | Status: AC
  Administered 2021-01-17: 2 g via INTRAVENOUS
  Filled 2021-01-16: qty 100

## 2021-01-16 MED ORDER — MILRINONE LACTATE IN DEXTROSE 20-5 MG/100ML-% IV SOLN
0.3000 ug/kg/min | INTRAVENOUS | Status: DC
  Filled 2021-01-16: qty 100

## 2021-01-16 MED ORDER — HEPARIN 30,000 UNITS/1000 ML (OHS) CELLSAVER SOLUTION
Status: DC
  Filled 2021-01-16: qty 1000

## 2021-01-16 MED ORDER — PLASMA-LYTE A IV SOLN
INTRAVENOUS | Status: DC
  Filled 2021-01-16: qty 5

## 2021-01-16 MED ORDER — NOREPINEPHRINE 4 MG/250ML-% IV SOLN
0.0000 ug/min | INTRAVENOUS | Status: DC
  Filled 2021-01-16: qty 250

## 2021-01-16 MED ORDER — TRANEXAMIC ACID (OHS) BOLUS VIA INFUSION
15.0000 mg/kg | INTRAVENOUS | Status: AC
  Administered 2021-01-17: 721.5 mg via INTRAVENOUS
  Filled 2021-01-16: qty 722

## 2021-01-16 MED ORDER — VANCOMYCIN HCL IN DEXTROSE 1-5 GM/200ML-% IV SOLN
1000.0000 mg | INTRAVENOUS | Status: AC
  Administered 2021-01-17: 1000 mg via INTRAVENOUS
  Filled 2021-01-16: qty 200

## 2021-01-16 MED ORDER — MANNITOL 20 % IV SOLN
INTRAVENOUS | Status: DC
  Filled 2021-01-16: qty 13

## 2021-01-16 MED ORDER — EPINEPHRINE HCL 5 MG/250ML IV SOLN IN NS
0.0000 ug/min | INTRAVENOUS | Status: DC
  Filled 2021-01-16: qty 250

## 2021-01-16 MED ORDER — INSULIN REGULAR(HUMAN) IN NACL 100-0.9 UT/100ML-% IV SOLN
INTRAVENOUS | Status: AC
  Administered 2021-01-17: 1.1 [IU]/h via INTRAVENOUS
  Filled 2021-01-16: qty 100

## 2021-01-16 MED ORDER — POTASSIUM CHLORIDE 2 MEQ/ML IV SOLN
80.0000 meq | INTRAVENOUS | Status: DC
  Filled 2021-01-16: qty 40

## 2021-01-16 MED ORDER — NITROGLYCERIN IN D5W 200-5 MCG/ML-% IV SOLN
2.0000 ug/min | INTRAVENOUS | Status: DC
  Filled 2021-01-16: qty 250

## 2021-01-16 MED ORDER — DEXMEDETOMIDINE HCL IN NACL 400 MCG/100ML IV SOLN
0.1000 ug/kg/h | INTRAVENOUS | Status: AC
  Administered 2021-01-17: .7 ug/kg/h via INTRAVENOUS
  Filled 2021-01-16: qty 100

## 2021-01-16 MED ORDER — TRANEXAMIC ACID (OHS) PUMP PRIME SOLUTION
2.0000 mg/kg | INTRAVENOUS | Status: DC
  Filled 2021-01-16: qty 0.96

## 2021-01-16 MED ORDER — PHENYLEPHRINE HCL-NACL 20-0.9 MG/250ML-% IV SOLN
30.0000 ug/min | INTRAVENOUS | Status: AC
  Administered 2021-01-17: 20 ug/min via INTRAVENOUS
  Filled 2021-01-16: qty 250

## 2021-01-16 MED ORDER — TRANEXAMIC ACID 1000 MG/10ML IV SOLN
1.5000 mg/kg/h | INTRAVENOUS | Status: AC
  Administered 2021-01-17: 1.5 mg/kg/h via INTRAVENOUS
  Filled 2021-01-16: qty 25

## 2021-01-17 ENCOUNTER — Inpatient Hospital Stay (HOSPITAL_COMMUNITY): Payer: Self-pay | Admitting: Anesthesiology

## 2021-01-17 ENCOUNTER — Inpatient Hospital Stay (HOSPITAL_COMMUNITY)
Admission: RE | Admit: 2021-01-17 | Discharge: 2021-01-21 | DRG: 220 | Disposition: A | Discharge status: Home / Self Care | Payer: Self-pay | Attending: Thoracic Surgery (Cardiothoracic Vascular Surgery) | Admitting: Thoracic Surgery (Cardiothoracic Vascular Surgery)

## 2021-01-17 ENCOUNTER — Inpatient Hospital Stay (HOSPITAL_COMMUNITY)
Admission: RE | Disposition: A | Discharge status: Home / Self Care | Payer: Self-pay | Source: Home / Self Care | Attending: Thoracic Surgery (Cardiothoracic Vascular Surgery)

## 2021-01-17 ENCOUNTER — Other Ambulatory Visit: Payer: Self-pay

## 2021-01-17 ENCOUNTER — Inpatient Hospital Stay (HOSPITAL_COMMUNITY): Payer: Self-pay

## 2021-01-17 ENCOUNTER — Encounter (HOSPITAL_COMMUNITY): Payer: Self-pay | Admitting: Thoracic Surgery (Cardiothoracic Vascular Surgery)

## 2021-01-17 DIAGNOSIS — I361 Nonrheumatic tricuspid (valve) insufficiency: Principal | ICD-10-CM | POA: Diagnosis present

## 2021-01-17 DIAGNOSIS — Z79899 Other long term (current) drug therapy: Secondary | ICD-10-CM

## 2021-01-17 DIAGNOSIS — D6959 Other secondary thrombocytopenia: Secondary | ICD-10-CM | POA: Diagnosis not present

## 2021-01-17 DIAGNOSIS — I110 Hypertensive heart disease with heart failure: Secondary | ICD-10-CM | POA: Diagnosis present

## 2021-01-17 DIAGNOSIS — D62 Acute posthemorrhagic anemia: Secondary | ICD-10-CM | POA: Diagnosis not present

## 2021-01-17 DIAGNOSIS — Z20822 Contact with and (suspected) exposure to covid-19: Secondary | ICD-10-CM | POA: Diagnosis present

## 2021-01-17 DIAGNOSIS — Z8249 Family history of ischemic heart disease and other diseases of the circulatory system: Secondary | ICD-10-CM

## 2021-01-17 DIAGNOSIS — I50810 Right heart failure, unspecified: Secondary | ICD-10-CM | POA: Diagnosis present

## 2021-01-17 DIAGNOSIS — F411 Generalized anxiety disorder: Secondary | ICD-10-CM | POA: Diagnosis present

## 2021-01-17 DIAGNOSIS — J90 Pleural effusion, not elsewhere classified: Secondary | ICD-10-CM

## 2021-01-17 DIAGNOSIS — Z9889 Other specified postprocedural states: Secondary | ICD-10-CM

## 2021-01-17 DIAGNOSIS — I079 Rheumatic tricuspid valve disease, unspecified: Secondary | ICD-10-CM

## 2021-01-17 DIAGNOSIS — Z87891 Personal history of nicotine dependence: Secondary | ICD-10-CM

## 2021-01-17 HISTORY — PX: TEE WITHOUT CARDIOVERSION: SHX5443

## 2021-01-17 HISTORY — PX: TRICUSPID VALVE REPLACEMENT: SHX816

## 2021-01-17 LAB — POCT I-STAT, CHEM 8
BUN: 11 mg/dL (ref 6–20)
BUN: 13 mg/dL (ref 6–20)
BUN: 10 mg/dL (ref 6–20)
BUN: 9 mg/dL (ref 6–20)
BUN: 8 mg/dL (ref 6–20)
BUN: 8 mg/dL (ref 6–20)
Calcium, Ion: 1.21 mmol/L (ref 1.15–1.40)
Calcium, Ion: 1.27 mmol/L (ref 1.15–1.40)
Calcium, Ion: 1.03 mmol/L — ABNORMAL LOW (ref 1.15–1.40)
Calcium, Ion: 0.95 mmol/L — ABNORMAL LOW (ref 1.15–1.40)
Calcium, Ion: 0.89 mmol/L — CL (ref 1.15–1.40)
Calcium, Ion: 1.32 mmol/L (ref 1.15–1.40)
Chloride: 107 mmol/L (ref 98–111)
Chloride: 108 mmol/L (ref 98–111)
Chloride: 105 mmol/L (ref 98–111)
Chloride: 107 mmol/L (ref 98–111)
Chloride: 106 mmol/L (ref 98–111)
Chloride: 108 mmol/L (ref 98–111)
Creatinine, Ser: 0.4 mg/dL — ABNORMAL LOW (ref 0.44–1.00)
Creatinine, Ser: 0.4 mg/dL — ABNORMAL LOW (ref 0.44–1.00)
Creatinine, Ser: 0.4 mg/dL — ABNORMAL LOW (ref 0.44–1.00)
Creatinine, Ser: 0.3 mg/dL — ABNORMAL LOW (ref 0.44–1.00)
Creatinine, Ser: 0.3 mg/dL — ABNORMAL LOW (ref 0.44–1.00)
Creatinine, Ser: 0.3 mg/dL — ABNORMAL LOW (ref 0.44–1.00)
Glucose, Bld: 106 mg/dL — ABNORMAL HIGH (ref 70–99)
Glucose, Bld: 147 mg/dL — ABNORMAL HIGH (ref 70–99)
Glucose, Bld: 169 mg/dL — ABNORMAL HIGH (ref 70–99)
Glucose, Bld: 182 mg/dL — ABNORMAL HIGH (ref 70–99)
Glucose, Bld: 181 mg/dL — ABNORMAL HIGH (ref 70–99)
Glucose, Bld: 204 mg/dL — ABNORMAL HIGH (ref 70–99)
HCT: 42 % (ref 36.0–46.0)
HCT: 42 % (ref 36.0–46.0)
HCT: 29 % — ABNORMAL LOW (ref 36.0–46.0)
HCT: 32 % — ABNORMAL LOW (ref 36.0–46.0)
HCT: 27 % — ABNORMAL LOW (ref 36.0–46.0)
HCT: 31 % — ABNORMAL LOW (ref 36.0–46.0)
Hemoglobin: 14.3 g/dL (ref 12.0–15.0)
Hemoglobin: 14.3 g/dL (ref 12.0–15.0)
Hemoglobin: 9.9 g/dL — ABNORMAL LOW (ref 12.0–15.0)
Hemoglobin: 10.9 g/dL — ABNORMAL LOW (ref 12.0–15.0)
Hemoglobin: 10.5 g/dL — ABNORMAL LOW (ref 12.0–15.0)
Hemoglobin: 9.2 g/dL — ABNORMAL LOW (ref 12.0–15.0)
Potassium: 3.7 mmol/L (ref 3.5–5.1)
Potassium: 3.9 mmol/L (ref 3.5–5.1)
Potassium: 4.2 mmol/L (ref 3.5–5.1)
Potassium: 3.6 mmol/L (ref 3.5–5.1)
Potassium: 3.2 mmol/L — ABNORMAL LOW (ref 3.5–5.1)
Potassium: 3.3 mmol/L — ABNORMAL LOW (ref 3.5–5.1)
Sodium: 139 mmol/L (ref 135–145)
Sodium: 141 mmol/L (ref 135–145)
Sodium: 138 mmol/L (ref 135–145)
Sodium: 139 mmol/L (ref 135–145)
Sodium: 141 mmol/L (ref 135–145)
Sodium: 142 mmol/L (ref 135–145)
TCO2: 24 mmol/L (ref 22–32)
TCO2: 24 mmol/L (ref 22–32)
TCO2: 26 mmol/L (ref 22–32)
TCO2: 21 mmol/L — ABNORMAL LOW (ref 22–32)
TCO2: 23 mmol/L (ref 22–32)
TCO2: 26 mmol/L (ref 22–32)

## 2021-01-17 LAB — POCT I-STAT 7, (LYTES, BLD GAS, ICA,H+H)
Acid-Base Excess: 2 mmol/L (ref 0.0–2.0)
Acid-base deficit: 1 mmol/L (ref 0.0–2.0)
Acid-base deficit: 1 mmol/L (ref 0.0–2.0)
Acid-base deficit: 2 mmol/L (ref 0.0–2.0)
Acid-base deficit: 2 mmol/L (ref 0.0–2.0)
Acid-base deficit: 4 mmol/L — ABNORMAL HIGH (ref 0.0–2.0)
Acid-base deficit: 1 mmol/L (ref 0.0–2.0)
Acid-base deficit: 1 mmol/L (ref 0.0–2.0)
Acid-base deficit: 4 mmol/L — ABNORMAL HIGH (ref 0.0–2.0)
Acid-base deficit: 7 mmol/L — ABNORMAL HIGH (ref 0.0–2.0)
Bicarbonate: 23.8 mmol/L (ref 20.0–28.0)
Bicarbonate: 23.4 mmol/L (ref 20.0–28.0)
Bicarbonate: 21.9 mmol/L (ref 20.0–28.0)
Bicarbonate: 20.4 mmol/L (ref 20.0–28.0)
Bicarbonate: 22.2 mmol/L (ref 20.0–28.0)
Bicarbonate: 22.5 mmol/L (ref 20.0–28.0)
Bicarbonate: 24.7 mmol/L (ref 20.0–28.0)
Bicarbonate: 25.1 mmol/L (ref 20.0–28.0)
Bicarbonate: 23.7 mmol/L (ref 20.0–28.0)
Bicarbonate: 16.9 mmol/L — ABNORMAL LOW (ref 20.0–28.0)
Calcium, Ion: 1.26 mmol/L (ref 1.15–1.40)
Calcium, Ion: 1.01 mmol/L — ABNORMAL LOW (ref 1.15–1.40)
Calcium, Ion: 1.05 mmol/L — ABNORMAL LOW (ref 1.15–1.40)
Calcium, Ion: 0.97 mmol/L — ABNORMAL LOW (ref 1.15–1.40)
Calcium, Ion: 0.97 mmol/L — ABNORMAL LOW (ref 1.15–1.40)
Calcium, Ion: 0.91 mmol/L — ABNORMAL LOW (ref 1.15–1.40)
Calcium, Ion: 0.92 mmol/L — ABNORMAL LOW (ref 1.15–1.40)
Calcium, Ion: 1.32 mmol/L (ref 1.15–1.40)
Calcium, Ion: 1.25 mmol/L (ref 1.15–1.40)
Calcium, Ion: 0.95 mmol/L — ABNORMAL LOW (ref 1.15–1.40)
HCT: 41 % (ref 36.0–46.0)
HCT: 30 % — ABNORMAL LOW (ref 36.0–46.0)
HCT: 28 % — ABNORMAL LOW (ref 36.0–46.0)
HCT: 31 % — ABNORMAL LOW (ref 36.0–46.0)
HCT: 33 % — ABNORMAL LOW (ref 36.0–46.0)
HCT: 28 % — ABNORMAL LOW (ref 36.0–46.0)
HCT: 28 % — ABNORMAL LOW (ref 36.0–46.0)
HCT: 32 % — ABNORMAL LOW (ref 36.0–46.0)
HCT: 32 % — ABNORMAL LOW (ref 36.0–46.0)
HCT: 24 % — ABNORMAL LOW (ref 36.0–46.0)
Hemoglobin: 13.9 g/dL (ref 12.0–15.0)
Hemoglobin: 10.2 g/dL — ABNORMAL LOW (ref 12.0–15.0)
Hemoglobin: 9.5 g/dL — ABNORMAL LOW (ref 12.0–15.0)
Hemoglobin: 10.5 g/dL — ABNORMAL LOW (ref 12.0–15.0)
Hemoglobin: 11.2 g/dL — ABNORMAL LOW (ref 12.0–15.0)
Hemoglobin: 10.9 g/dL — ABNORMAL LOW (ref 12.0–15.0)
Hemoglobin: 9.5 g/dL — ABNORMAL LOW (ref 12.0–15.0)
Hemoglobin: 9.5 g/dL — ABNORMAL LOW (ref 12.0–15.0)
Hemoglobin: 10.9 g/dL — ABNORMAL LOW (ref 12.0–15.0)
Hemoglobin: 8.2 g/dL — ABNORMAL LOW (ref 12.0–15.0)
O2 Saturation: 100 %
O2 Saturation: 100 %
O2 Saturation: 100 %
O2 Saturation: 100 %
O2 Saturation: 100 %
O2 Saturation: 100 %
O2 Saturation: 100 %
O2 Saturation: 100 %
O2 Saturation: 100 %
O2 Saturation: 100 %
Patient temperature: 36.4
Patient temperature: 36.7
Potassium: 3.9 mmol/L (ref 3.5–5.1)
Potassium: 4 mmol/L (ref 3.5–5.1)
Potassium: 3.6 mmol/L (ref 3.5–5.1)
Potassium: 3.1 mmol/L — ABNORMAL LOW (ref 3.5–5.1)
Potassium: 3.6 mmol/L (ref 3.5–5.1)
Potassium: 3.2 mmol/L — ABNORMAL LOW (ref 3.5–5.1)
Potassium: 3.3 mmol/L — ABNORMAL LOW (ref 3.5–5.1)
Potassium: 3.4 mmol/L — ABNORMAL LOW (ref 3.5–5.1)
Potassium: 3.3 mmol/L — ABNORMAL LOW (ref 3.5–5.1)
Potassium: 3 mmol/L — ABNORMAL LOW (ref 3.5–5.1)
Sodium: 141 mmol/L (ref 135–145)
Sodium: 138 mmol/L (ref 135–145)
Sodium: 138 mmol/L (ref 135–145)
Sodium: 139 mmol/L (ref 135–145)
Sodium: 140 mmol/L (ref 135–145)
Sodium: 141 mmol/L (ref 135–145)
Sodium: 142 mmol/L (ref 135–145)
Sodium: 143 mmol/L (ref 135–145)
Sodium: 143 mmol/L (ref 135–145)
Sodium: 147 mmol/L — ABNORMAL HIGH (ref 135–145)
TCO2: 25 mmol/L (ref 22–32)
TCO2: 25 mmol/L (ref 22–32)
TCO2: 23 mmol/L (ref 22–32)
TCO2: 21 mmol/L — ABNORMAL LOW (ref 22–32)
TCO2: 23 mmol/L (ref 22–32)
TCO2: 24 mmol/L (ref 22–32)
TCO2: 26 mmol/L (ref 22–32)
TCO2: 26 mmol/L (ref 22–32)
TCO2: 25 mmol/L (ref 22–32)
TCO2: 18 mmol/L — ABNORMAL LOW (ref 22–32)
pCO2 arterial: 37.9 mmHg (ref 32.0–48.0)
pCO2 arterial: 36.8 mmHg (ref 32.0–48.0)
pCO2 arterial: 31 mmHg — ABNORMAL LOW (ref 32.0–48.0)
pCO2 arterial: 32.1 mmHg (ref 32.0–48.0)
pCO2 arterial: 35.3 mmHg (ref 32.0–48.0)
pCO2 arterial: 34.2 mmHg (ref 32.0–48.0)
pCO2 arterial: 34.2 mmHg (ref 32.0–48.0)
pCO2 arterial: 43.9 mmHg (ref 32.0–48.0)
pCO2 arterial: 51.6 mmHg — ABNORMAL HIGH (ref 32.0–48.0)
pCO2 arterial: 26.4 mmHg — ABNORMAL LOW (ref 32.0–48.0)
pH, Arterial: 7.407 (ref 7.350–7.450)
pH, Arterial: 7.411 (ref 7.350–7.450)
pH, Arterial: 7.456 — ABNORMAL HIGH (ref 7.350–7.450)
pH, Arterial: 7.407 (ref 7.350–7.450)
pH, Arterial: 7.412 (ref 7.350–7.450)
pH, Arterial: 7.358 (ref 7.350–7.450)
pH, Arterial: 7.427 (ref 7.350–7.450)
pH, Arterial: 7.474 — ABNORMAL HIGH (ref 7.350–7.450)
pH, Arterial: 7.267 — ABNORMAL LOW (ref 7.350–7.450)
pH, Arterial: 7.412 (ref 7.350–7.450)
pO2, Arterial: 445 mmHg — ABNORMAL HIGH (ref 83.0–108.0)
pO2, Arterial: 375 mmHg — ABNORMAL HIGH (ref 83.0–108.0)
pO2, Arterial: 391 mmHg — ABNORMAL HIGH (ref 83.0–108.0)
pO2, Arterial: 399 mmHg — ABNORMAL HIGH (ref 83.0–108.0)
pO2, Arterial: 413 mmHg — ABNORMAL HIGH (ref 83.0–108.0)
pO2, Arterial: 318 mmHg — ABNORMAL HIGH (ref 83.0–108.0)
pO2, Arterial: 346 mmHg — ABNORMAL HIGH (ref 83.0–108.0)
pO2, Arterial: 455 mmHg — ABNORMAL HIGH (ref 83.0–108.0)
pO2, Arterial: 204 mmHg — ABNORMAL HIGH (ref 83.0–108.0)
pO2, Arterial: 238 mmHg — ABNORMAL HIGH (ref 83.0–108.0)

## 2021-01-17 LAB — POCT I-STAT EG7
Acid-base deficit: 3 mmol/L — ABNORMAL HIGH (ref 0.0–2.0)
Bicarbonate: 21.9 mmol/L (ref 20.0–28.0)
Calcium, Ion: 1.04 mmol/L — ABNORMAL LOW (ref 1.15–1.40)
HCT: 28 % — ABNORMAL LOW (ref 36.0–46.0)
Hemoglobin: 9.5 g/dL — ABNORMAL LOW (ref 12.0–15.0)
O2 Saturation: 73 %
Potassium: 3.8 mmol/L (ref 3.5–5.1)
Sodium: 139 mmol/L (ref 135–145)
TCO2: 23 mmol/L (ref 22–32)
pCO2, Ven: 36.9 mmHg — ABNORMAL LOW (ref 44.0–60.0)
pH, Ven: 7.381 (ref 7.250–7.430)
pO2, Ven: 39 mmHg (ref 32.0–45.0)

## 2021-01-17 LAB — CBC
HCT: 34 % — ABNORMAL LOW (ref 36.0–46.0)
Hemoglobin: 11.3 g/dL — ABNORMAL LOW (ref 12.0–15.0)
MCH: 28.4 pg (ref 26.0–34.0)
MCHC: 33.2 g/dL (ref 30.0–36.0)
MCV: 85.4 fL (ref 80.0–100.0)
Platelets: 95 10*3/uL — ABNORMAL LOW (ref 150–400)
RBC: 3.98 MIL/uL (ref 3.87–5.11)
RDW: 14.6 % (ref 11.5–15.5)
WBC: 11.7 10*3/uL — ABNORMAL HIGH (ref 4.0–10.5)
nRBC: 0 % (ref 0.0–0.2)

## 2021-01-17 LAB — PREPARE RBC (CROSSMATCH)

## 2021-01-17 LAB — GLUCOSE, CAPILLARY
Glucose-Capillary: 104 mg/dL — ABNORMAL HIGH (ref 70–99)
Glucose-Capillary: 75 mg/dL (ref 70–99)
Glucose-Capillary: 75 mg/dL (ref 70–99)
Glucose-Capillary: 89 mg/dL (ref 70–99)
Glucose-Capillary: 98 mg/dL (ref 70–99)
Glucose-Capillary: 99 mg/dL (ref 70–99)

## 2021-01-17 LAB — HEMOGLOBIN AND HEMATOCRIT, BLOOD
HCT: 29.4 % — ABNORMAL LOW (ref 36.0–46.0)
Hemoglobin: 10 g/dL — ABNORMAL LOW (ref 12.0–15.0)

## 2021-01-17 LAB — POCT PREGNANCY, URINE: Preg Test, Ur: NEGATIVE

## 2021-01-17 LAB — PROTIME-INR
INR: 1.8 — ABNORMAL HIGH (ref 0.8–1.2)
Prothrombin Time: 20.5 seconds — ABNORMAL HIGH (ref 11.4–15.2)

## 2021-01-17 LAB — PLATELET COUNT: Platelets: 72 10*3/uL — ABNORMAL LOW (ref 150–400)

## 2021-01-17 LAB — APTT: aPTT: 36 seconds (ref 24–36)

## 2021-01-17 LAB — MRSA NEXT GEN BY PCR, NASAL: MRSA by PCR Next Gen: NOT DETECTED

## 2021-01-17 IMAGING — DX DG CHEST 1V PORT
1 series · 1 of 1 positions shown · non-contrast
Comparison: [DATE]

CLINICAL DATA: Tricuspid valve repair

EXAM:
PORTABLE CHEST 1 VIEW

[chest]
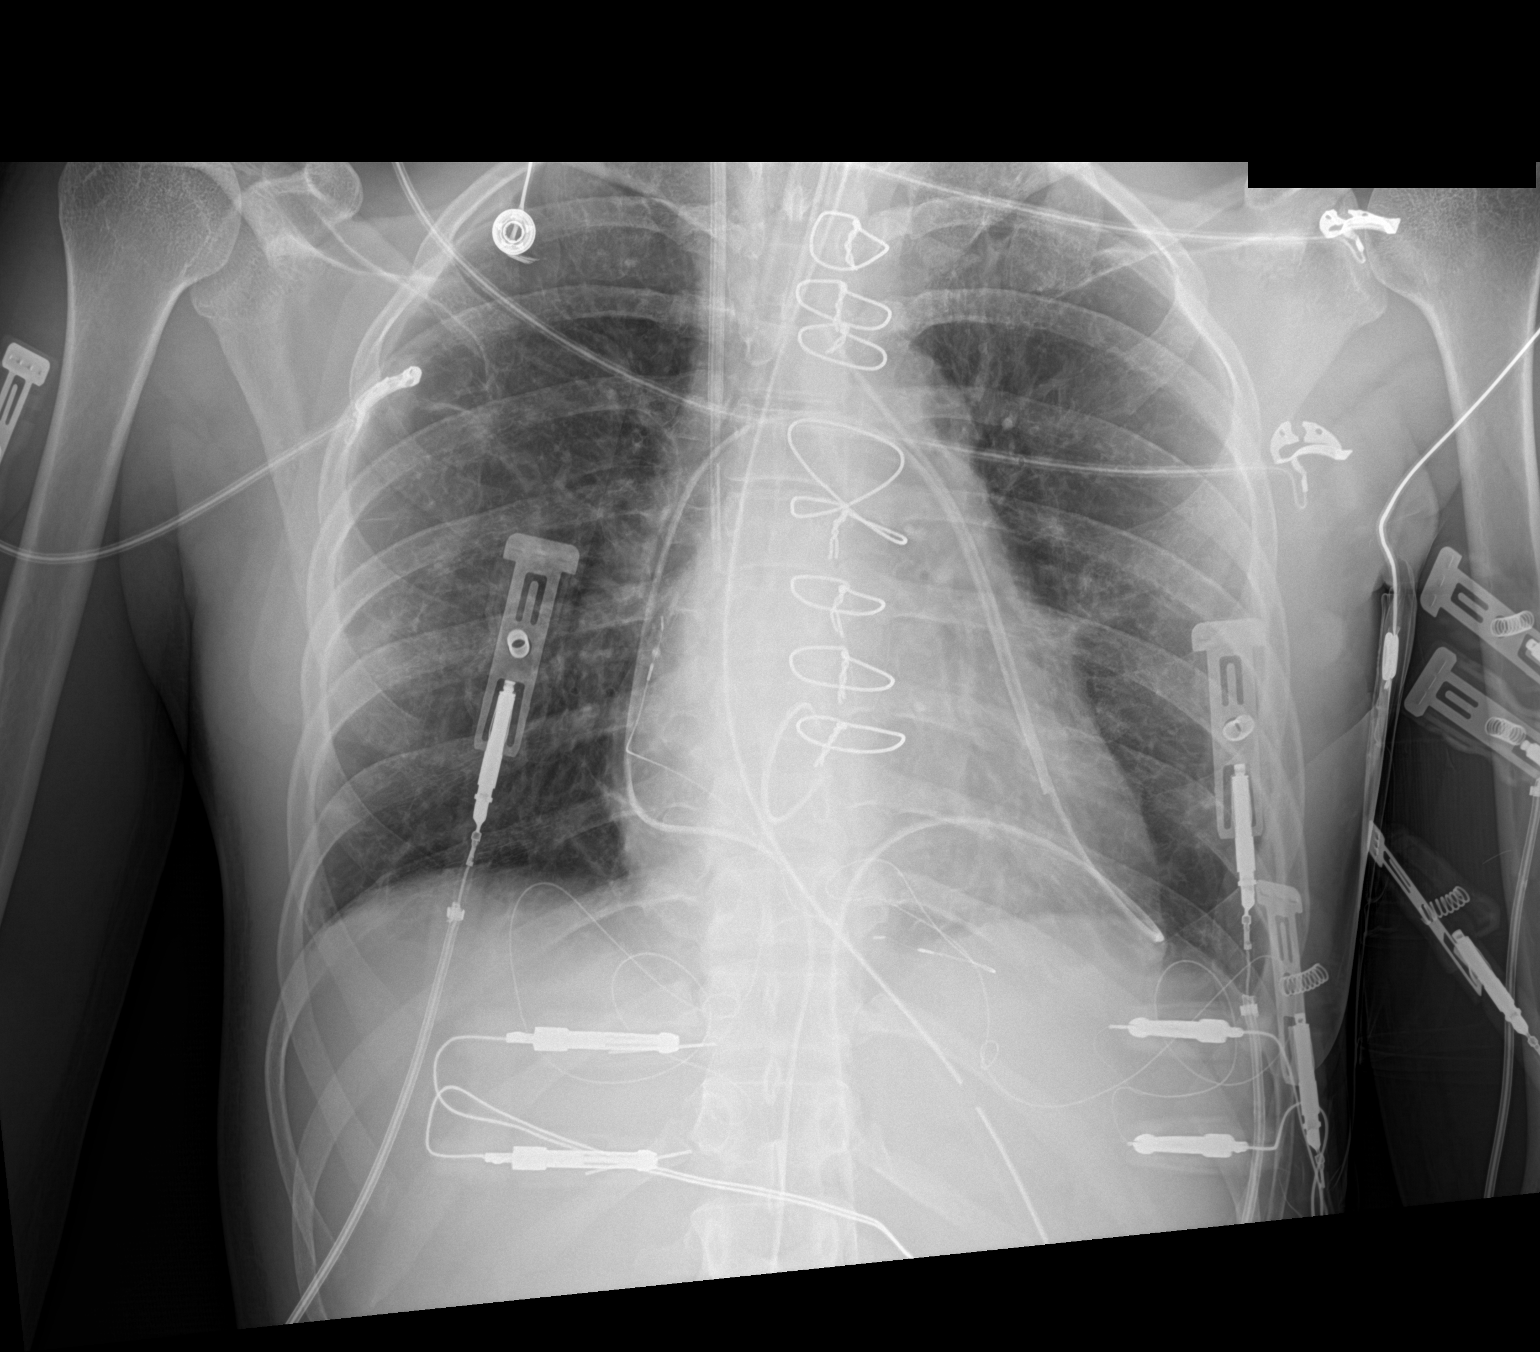

[1 of 1 positions shown; findings below may reference images not displayed]

FINDINGS: Single frontal view of the chest demonstrates endotracheal tube
overlying tracheal air column tip midway between thoracic inlet and
carina. Enteric catheter passes below diaphragm, side port
projecting over gastric fundus and tip excluded by collimation.
Right internal jugular catheter tip overlies superior vena cava.
Pericardial drains are identified. Postsurgical changes from median
sternotomy and tricuspid valve repair. Epicardial pacing wires are
noted.

Mild central vascular congestion. No airspace disease, effusion, or
pneumothorax. No acute bony abnormalities.
IMPRESSION: 1. Postsurgical changes as above.
2. Mild vascular congestion without overt edema.

## 2021-01-17 SURGERY — TRICUSPID VALVE REPAIR
Anesthesia: General

## 2021-01-17 MED ORDER — LACTATED RINGERS IV SOLN: 500.0000 mL | Freq: Once | INTRAVENOUS | Status: DC | PRN

## 2021-01-17 MED ORDER — ESCITALOPRAM OXALATE 20 MG PO TABS
20.0000 mg | ORAL_TABLET | Freq: Every day | ORAL | Status: DC
  Administered 2021-01-18 – 2021-01-20 (×3): 20 mg via ORAL
  Filled 2021-01-17 (×4): qty 1

## 2021-01-17 MED ORDER — DEXMEDETOMIDINE HCL IN NACL 400 MCG/100ML IV SOLN
0.0000 ug/kg/h | INTRAVENOUS | Status: DC
  Administered 2021-01-17: 0.6 ug/kg/h via INTRAVENOUS

## 2021-01-17 MED ORDER — SODIUM CHLORIDE (PF) 0.9 % IJ SOLN
OROMUCOSAL | Status: DC | PRN
  Administered 2021-01-17 (×2): 4 mL via TOPICAL

## 2021-01-17 MED ORDER — CHLORHEXIDINE GLUCONATE 0.12 % MT SOLN
15.0000 mL | OROMUCOSAL | Status: AC
  Administered 2021-01-17: 15 mL via OROMUCOSAL

## 2021-01-17 MED ORDER — FENTANYL CITRATE (PF) 250 MCG/5ML IJ SOLN
INTRAMUSCULAR | Status: AC
  Filled 2021-01-17: qty 5

## 2021-01-17 MED ORDER — NOREPINEPHRINE 4 MG/250ML-% IV SOLN: 0.0000 ug/min | INTRAVENOUS | Status: DC

## 2021-01-17 MED ORDER — ALBUMIN HUMAN 5 % IV SOLN
250.0000 mL | INTRAVENOUS | Status: AC | PRN
  Administered 2021-01-17: 12.5 g via INTRAVENOUS

## 2021-01-17 MED ORDER — ROCURONIUM BROMIDE 10 MG/ML (PF) SYRINGE
PREFILLED_SYRINGE | INTRAVENOUS | Status: DC | PRN
Start: 2021-01-17 — End: 2021-01-17
  Administered 2021-01-17: 70 mg via INTRAVENOUS
  Administered 2021-01-17: 50 mg via INTRAVENOUS
  Administered 2021-01-17: 20 mg via INTRAVENOUS
  Administered 2021-01-17: 30 mg via INTRAVENOUS
  Administered 2021-01-17: 50 mg via INTRAVENOUS

## 2021-01-17 MED ORDER — ACETAMINOPHEN 160 MG/5ML PO SOLN: 650.0000 mg | Freq: Once | ORAL | Status: AC

## 2021-01-17 MED ORDER — GABAPENTIN 600 MG PO TABS
300.0000 mg | ORAL_TABLET | Freq: Every day | ORAL | Status: DC
  Administered 2021-01-18 – 2021-01-19 (×2): 300 mg via ORAL
  Filled 2021-01-17 (×2): qty 1

## 2021-01-17 MED ORDER — PANTOPRAZOLE SODIUM 40 MG PO TBEC
40.0000 mg | DELAYED_RELEASE_TABLET | Freq: Every day | ORAL | Status: DC
  Administered 2021-01-19 – 2021-01-21 (×3): 40 mg via ORAL
  Filled 2021-01-17 (×3): qty 1

## 2021-01-17 MED ORDER — SODIUM CHLORIDE 0.9% FLUSH: 3.0000 mL | INTRAVENOUS | Status: DC | PRN

## 2021-01-17 MED ORDER — ONDANSETRON HCL 4 MG/2ML IJ SOLN
4.0000 mg | Freq: Four times a day (QID) | INTRAMUSCULAR | Status: DC | PRN
  Administered 2021-01-18 – 2021-01-21 (×4): 4 mg via INTRAVENOUS
  Filled 2021-01-17 (×4): qty 2

## 2021-01-17 MED ORDER — ALBUMIN HUMAN 5 % IV SOLN: INTRAVENOUS | Status: DC | PRN

## 2021-01-17 MED ORDER — LIDOCAINE 2% (20 MG/ML) 5 ML SYRINGE
INTRAMUSCULAR | Status: DC | PRN
Start: 2021-01-17 — End: 2021-01-17
  Administered 2021-01-17: 40 mg via INTRAVENOUS

## 2021-01-17 MED ORDER — LACTATED RINGERS IV SOLN: INTRAVENOUS | Status: DC | PRN

## 2021-01-17 MED ORDER — SODIUM CHLORIDE 0.9% IV SOLUTION: Freq: Once | INTRAVENOUS | Status: DC

## 2021-01-17 MED ORDER — PROPOFOL 10 MG/ML IV BOLUS
INTRAVENOUS | Status: DC | PRN
  Administered 2021-01-17 (×2): 20 mg via INTRAVENOUS
  Administered 2021-01-17: 110 mg via INTRAVENOUS

## 2021-01-17 MED ORDER — METOPROLOL TARTRATE 12.5 MG HALF TABLET
12.5000 mg | ORAL_TABLET | Freq: Once | ORAL | Status: AC
  Administered 2021-01-17: 12.5 mg via ORAL
  Filled 2021-01-17: qty 1

## 2021-01-17 MED ORDER — MIDAZOLAM HCL 2 MG/2ML IJ SOLN
2.0000 mg | INTRAMUSCULAR | Status: DC | PRN
  Administered 2021-01-17 (×2): 2 mg via INTRAVENOUS
  Filled 2021-01-17: qty 2

## 2021-01-17 MED ORDER — PHENYLEPHRINE 40 MCG/ML (10ML) SYRINGE FOR IV PUSH (FOR BLOOD PRESSURE SUPPORT)
PREFILLED_SYRINGE | INTRAVENOUS | Status: AC
  Filled 2021-01-17: qty 10

## 2021-01-17 MED ORDER — FENTANYL CITRATE (PF) 100 MCG/2ML IJ SOLN
INTRAMUSCULAR | Status: AC
  Filled 2021-01-17: qty 2

## 2021-01-17 MED ORDER — SODIUM CHLORIDE 0.9 % IV SOLN: INTRAVENOUS | Status: DC

## 2021-01-17 MED ORDER — INSULIN ASPART 100 UNIT/ML IJ SOLN
0.0000 [IU] | INTRAMUSCULAR | Status: DC
  Administered 2021-01-18 – 2021-01-19 (×2): 2 [IU] via SUBCUTANEOUS

## 2021-01-17 MED ORDER — ACETAMINOPHEN 650 MG RE SUPP
650.0000 mg | Freq: Once | RECTAL | Status: AC
Start: 2021-01-17 — End: 2021-01-17
  Administered 2021-01-17: 650 mg via RECTAL

## 2021-01-17 MED ORDER — NOREPINEPHRINE 4 MG/250ML-% IV SOLN
0.0000 ug/min | INTRAVENOUS | Status: DC
  Administered 2021-01-17: 2 ug/min via INTRAVENOUS

## 2021-01-17 MED ORDER — ACETAMINOPHEN 500 MG PO TABS
1000.0000 mg | ORAL_TABLET | Freq: Four times a day (QID) | ORAL | Status: DC
  Administered 2021-01-18 – 2021-01-21 (×14): 1000 mg via ORAL
  Filled 2021-01-17 (×14): qty 2

## 2021-01-17 MED ORDER — ROCURONIUM BROMIDE 10 MG/ML (PF) SYRINGE
PREFILLED_SYRINGE | INTRAVENOUS | Status: AC
  Filled 2021-01-17: qty 10

## 2021-01-17 MED ORDER — PROTAMINE SULFATE 10 MG/ML IV SOLN
INTRAVENOUS | Status: DC | PRN
  Administered 2021-01-17: 160 mg via INTRAVENOUS

## 2021-01-17 MED ORDER — LIDOCAINE 2% (20 MG/ML) 5 ML SYRINGE
INTRAMUSCULAR | Status: AC
  Filled 2021-01-17: qty 5

## 2021-01-17 MED ORDER — METOPROLOL TARTRATE 12.5 MG HALF TABLET
12.5000 mg | ORAL_TABLET | Freq: Two times a day (BID) | ORAL | Status: DC
  Administered 2021-01-18: 12.5 mg via ORAL
  Filled 2021-01-17: qty 1

## 2021-01-17 MED ORDER — ASPIRIN EC 325 MG PO TBEC
325.0000 mg | DELAYED_RELEASE_TABLET | Freq: Every day | ORAL | Status: DC
  Administered 2021-01-18 – 2021-01-19 (×2): 325 mg via ORAL
  Filled 2021-01-17 (×2): qty 1

## 2021-01-17 MED ORDER — CHLORHEXIDINE GLUCONATE 0.12% ORAL RINSE (MEDLINE KIT): 15.0000 mL | Freq: Two times a day (BID) | OROMUCOSAL | Status: DC

## 2021-01-17 MED ORDER — ORAL CARE MOUTH RINSE: 15.0000 mL | OROMUCOSAL | Status: DC

## 2021-01-17 MED ORDER — TRAMADOL HCL 50 MG PO TABS
50.0000 mg | ORAL_TABLET | ORAL | Status: DC | PRN
  Administered 2021-01-18 – 2021-01-19 (×4): 50 mg via ORAL
  Filled 2021-01-17 (×4): qty 1

## 2021-01-17 MED ORDER — SODIUM BICARBONATE 8.4 % IV SOLN
50.0000 meq | Freq: Once | INTRAVENOUS | Status: AC
  Administered 2021-01-17: 50 meq via INTRAVENOUS

## 2021-01-17 MED ORDER — KETOROLAC TROMETHAMINE 15 MG/ML IJ SOLN
15.0000 mg | Freq: Four times a day (QID) | INTRAMUSCULAR | Status: AC
  Administered 2021-01-17 – 2021-01-19 (×8): 15 mg via INTRAVENOUS
  Filled 2021-01-17 (×8): qty 1

## 2021-01-17 MED ORDER — CHLORHEXIDINE GLUCONATE 4 % EX LIQD: 30.0000 mL | CUTANEOUS | Status: DC

## 2021-01-17 MED ORDER — EPHEDRINE SULFATE-NACL 50-0.9 MG/10ML-% IV SOSY
PREFILLED_SYRINGE | INTRAVENOUS | Status: DC | PRN
  Administered 2021-01-17: 5 mg via INTRAVENOUS

## 2021-01-17 MED ORDER — CHLORHEXIDINE GLUCONATE CLOTH 2 % EX PADS
6.0000 | MEDICATED_PAD | Freq: Every day | CUTANEOUS | Status: DC
  Administered 2021-01-17 – 2021-01-20 (×3): 6 via TOPICAL

## 2021-01-17 MED ORDER — METOPROLOL TARTRATE 25 MG/10 ML ORAL SUSPENSION: 12.5000 mg | Freq: Two times a day (BID) | ORAL | Status: DC

## 2021-01-17 MED ORDER — BISACODYL 5 MG PO TBEC
10.0000 mg | DELAYED_RELEASE_TABLET | Freq: Every day | ORAL | Status: DC
  Administered 2021-01-18 – 2021-01-19 (×2): 10 mg via ORAL
  Filled 2021-01-17 (×2): qty 2

## 2021-01-17 MED ORDER — SODIUM CHLORIDE 0.9% FLUSH: 10.0000 mL | INTRAVENOUS | Status: DC | PRN

## 2021-01-17 MED ORDER — LACTATED RINGERS IV SOLN: INTRAVENOUS | Status: DC

## 2021-01-17 MED ORDER — DOPAMINE-DEXTROSE 3.2-5 MG/ML-% IV SOLN: 0.0000 ug/kg/min | INTRAVENOUS | Status: DC

## 2021-01-17 MED ORDER — SODIUM CHLORIDE 0.9% FLUSH
3.0000 mL | Freq: Two times a day (BID) | INTRAVENOUS | Status: DC
  Administered 2021-01-18 – 2021-01-20 (×6): 3 mL via INTRAVENOUS

## 2021-01-17 MED ORDER — VANCOMYCIN HCL IN DEXTROSE 1-5 GM/200ML-% IV SOLN
1000.0000 mg | Freq: Once | INTRAVENOUS | Status: AC
  Administered 2021-01-18: 1000 mg via INTRAVENOUS
  Filled 2021-01-17: qty 200

## 2021-01-17 MED ORDER — GLUTARALDEHYDE 0.625% SOAKING SOLUTION
TOPICAL | Status: DC | PRN
  Administered 2021-01-17: 1 via TOPICAL

## 2021-01-17 MED ORDER — CHLORHEXIDINE GLUCONATE 0.12 % MT SOLN
OROMUCOSAL | Status: AC
  Administered 2021-01-17: 15 mL via OROMUCOSAL
  Filled 2021-01-17: qty 15

## 2021-01-17 MED ORDER — SODIUM CHLORIDE 0.9 % IV SOLN: INTRAVENOUS | Status: DC | PRN

## 2021-01-17 MED ORDER — DEXTROSE 50 % IV SOLN: 0.0000 mL | INTRAVENOUS | Status: DC | PRN

## 2021-01-17 MED ORDER — PHENYLEPHRINE 40 MCG/ML (10ML) SYRINGE FOR IV PUSH (FOR BLOOD PRESSURE SUPPORT)
PREFILLED_SYRINGE | INTRAVENOUS | Status: DC | PRN
  Administered 2021-01-17: 80 ug via INTRAVENOUS
  Administered 2021-01-17: 40 ug via INTRAVENOUS
  Administered 2021-01-17: 80 ug via INTRAVENOUS

## 2021-01-17 MED ORDER — PROTAMINE SULFATE 10 MG/ML IV SOLN
INTRAVENOUS | Status: AC
  Filled 2021-01-17: qty 25

## 2021-01-17 MED ORDER — OXYCODONE HCL 5 MG PO TABS
5.0000 mg | ORAL_TABLET | ORAL | Status: DC | PRN
  Administered 2021-01-18: 10 mg via ORAL
  Administered 2021-01-18 (×2): 5 mg via ORAL
  Administered 2021-01-19 – 2021-01-21 (×4): 10 mg via ORAL
  Filled 2021-01-17: qty 1
  Filled 2021-01-17: qty 2
  Filled 2021-01-17: qty 1
  Filled 2021-01-17 (×4): qty 2

## 2021-01-17 MED ORDER — MIDAZOLAM HCL (PF) 5 MG/ML IJ SOLN
INTRAMUSCULAR | Status: DC | PRN
  Administered 2021-01-17 (×2): 1 mg via INTRAVENOUS
  Administered 2021-01-17 (×2): 2 mg via INTRAVENOUS
  Administered 2021-01-17: 1 mg via INTRAVENOUS

## 2021-01-17 MED ORDER — MAGNESIUM SULFATE 4 GM/100ML IV SOLN
4.0000 g | Freq: Once | INTRAVENOUS | Status: AC
  Administered 2021-01-17: 4 g via INTRAVENOUS
  Filled 2021-01-17: qty 100

## 2021-01-17 MED ORDER — ~~LOC~~ CARDIAC SURGERY, PATIENT & FAMILY EDUCATION
Freq: Once | Status: DC
  Filled 2021-01-17: qty 1

## 2021-01-17 MED ORDER — SODIUM CHLORIDE 0.9 % IV SOLN: 250.0000 mL | INTRAVENOUS | Status: DC

## 2021-01-17 MED ORDER — BISACODYL 10 MG RE SUPP: 10.0000 mg | Freq: Every day | RECTAL | Status: DC

## 2021-01-17 MED ORDER — SODIUM CHLORIDE (PF) 0.9 % IJ SOLN
INTRAMUSCULAR | Status: AC
  Filled 2021-01-17: qty 10

## 2021-01-17 MED ORDER — SODIUM CHLORIDE 0.9% FLUSH
10.0000 mL | Freq: Two times a day (BID) | INTRAVENOUS | Status: DC
  Administered 2021-01-17 – 2021-01-19 (×4): 10 mL

## 2021-01-17 MED ORDER — MIDAZOLAM HCL (PF) 10 MG/2ML IJ SOLN
INTRAMUSCULAR | Status: AC
  Filled 2021-01-17: qty 2

## 2021-01-17 MED ORDER — FAMOTIDINE IN NACL 20-0.9 MG/50ML-% IV SOLN
20.0000 mg | Freq: Two times a day (BID) | INTRAVENOUS | Status: DC
  Administered 2021-01-17: 20 mg via INTRAVENOUS
  Filled 2021-01-17: qty 50

## 2021-01-17 MED ORDER — INSULIN REGULAR(HUMAN) IN NACL 100-0.9 UT/100ML-% IV SOLN
INTRAVENOUS | Status: DC
  Administered 2021-01-17: 0.4 [IU]/h via INTRAVENOUS

## 2021-01-17 MED ORDER — EPHEDRINE 5 MG/ML INJ
INTRAVENOUS | Status: AC
  Filled 2021-01-17: qty 5

## 2021-01-17 MED ORDER — GLUTARALDEHYDE 0.625% SOAKING SOLUTION
TOPICAL | Status: DC
  Filled 2021-01-17: qty 50

## 2021-01-17 MED ORDER — NOREPINEPHRINE 4 MG/250ML-% IV SOLN
INTRAVENOUS | Status: AC
  Administered 2021-01-17: 2 ug/min via INTRAVENOUS
  Filled 2021-01-17: qty 250

## 2021-01-17 MED ORDER — DOCUSATE SODIUM 100 MG PO CAPS
200.0000 mg | ORAL_CAPSULE | Freq: Every day | ORAL | Status: DC
  Administered 2021-01-18 – 2021-01-19 (×2): 200 mg via ORAL
  Filled 2021-01-17 (×3): qty 2

## 2021-01-17 MED ORDER — CHLORHEXIDINE GLUCONATE 0.12 % MT SOLN: 15.0000 mL | Freq: Once | OROMUCOSAL | Status: AC

## 2021-01-17 MED ORDER — FENTANYL CITRATE (PF) 250 MCG/5ML IJ SOLN
INTRAMUSCULAR | Status: DC | PRN
  Administered 2021-01-17 (×5): 100 ug via INTRAVENOUS
  Administered 2021-01-17: 250 ug via INTRAVENOUS
  Administered 2021-01-17: 100 ug via INTRAVENOUS
  Administered 2021-01-17: 50 ug via INTRAVENOUS
  Administered 2021-01-17 (×2): 100 ug via INTRAVENOUS

## 2021-01-17 MED ORDER — NITROGLYCERIN IN D5W 200-5 MCG/ML-% IV SOLN
0.0000 ug/min | INTRAVENOUS | Status: DC
  Administered 2021-01-17: 5 ug/min via INTRAVENOUS

## 2021-01-17 MED ORDER — MILRINONE LACTATE IN DEXTROSE 20-5 MG/100ML-% IV SOLN: 0.3000 ug/kg/min | INTRAVENOUS | Status: DC

## 2021-01-17 MED ORDER — MORPHINE SULFATE (PF) 2 MG/ML IV SOLN
1.0000 mg | INTRAVENOUS | Status: DC | PRN
  Administered 2021-01-18: 4 mg via INTRAVENOUS
  Administered 2021-01-18: 1 mg via INTRAVENOUS
  Filled 2021-01-17: qty 2
  Filled 2021-01-17: qty 1

## 2021-01-17 MED ORDER — MIDAZOLAM HCL 2 MG/2ML IJ SOLN
INTRAMUSCULAR | Status: AC
  Filled 2021-01-17: qty 2

## 2021-01-17 MED ORDER — VASOPRESSIN 20 UNITS/100 ML INFUSION FOR SHOCK: 0.0000 [IU]/min | INTRAVENOUS | Status: DC

## 2021-01-17 MED ORDER — ACETAMINOPHEN 160 MG/5ML PO SOLN
1000.0000 mg | Freq: Four times a day (QID) | ORAL | Status: DC
  Administered 2021-01-17: 1000 mg
  Filled 2021-01-17: qty 40.6

## 2021-01-17 MED ORDER — POTASSIUM CHLORIDE 10 MEQ/50ML IV SOLN
10.0000 meq | INTRAVENOUS | Status: AC
  Administered 2021-01-17 (×3): 10 meq via INTRAVENOUS

## 2021-01-17 MED ORDER — PROPOFOL 10 MG/ML IV BOLUS
INTRAVENOUS | Status: AC
  Filled 2021-01-17: qty 20

## 2021-01-17 MED ORDER — 0.9 % SODIUM CHLORIDE (POUR BTL) OPTIME
TOPICAL | Status: DC | PRN
Start: 2021-01-17 — End: 2021-01-17
  Administered 2021-01-17: 5000 mL
  Administered 2021-01-17 (×2): 1000 mL

## 2021-01-17 MED ORDER — CEFAZOLIN SODIUM-DEXTROSE 2-4 GM/100ML-% IV SOLN
2.0000 g | Freq: Three times a day (TID) | INTRAVENOUS | Status: AC
  Administered 2021-01-17 – 2021-01-19 (×6): 2 g via INTRAVENOUS
  Filled 2021-01-17 (×6): qty 100

## 2021-01-17 MED ORDER — HEPARIN SODIUM (PORCINE) 1000 UNIT/ML IJ SOLN
INTRAMUSCULAR | Status: AC
  Filled 2021-01-17: qty 20

## 2021-01-17 MED ORDER — ASPIRIN 81 MG PO CHEW
324.0000 mg | CHEWABLE_TABLET | Freq: Every day | ORAL | Status: DC
  Administered 2021-01-20 – 2021-01-21 (×2): 324 mg
  Filled 2021-01-17 (×3): qty 4

## 2021-01-17 MED ORDER — METOPROLOL TARTRATE 5 MG/5ML IV SOLN: 2.5000 mg | INTRAVENOUS | Status: DC | PRN

## 2021-01-17 MED ORDER — SODIUM CHLORIDE 0.45 % IV SOLN: INTRAVENOUS | Status: DC | PRN

## 2021-01-17 MED ORDER — HEPARIN SODIUM (PORCINE) 1000 UNIT/ML IJ SOLN
INTRAMUSCULAR | Status: DC | PRN
  Administered 2021-01-17: 16000 [IU] via INTRAVENOUS

## 2021-01-17 SURGICAL SUPPLY — 88 items
ADAPTER CARDIO PERF ANTE/RETRO (ADAPTER) ×3 IMPLANT
ATTRACTOMAT 16X20 MAGNETIC DRP (DRAPES) ×2 IMPLANT
BAG DECANTER FOR FLEXI CONT (MISCELLANEOUS) ×3 IMPLANT
BLADE CLIPPER SURG (BLADE) ×3 IMPLANT
BLADE STERNUM SYSTEM 6 (BLADE) ×3 IMPLANT
BLADE SURG 15 STRL LF DISP TIS (BLADE) ×2 IMPLANT
BLADE SURG 15 STRL SS (BLADE) ×1
CANISTER SUCT 3000ML PPV (MISCELLANEOUS) ×3 IMPLANT
CANN PRFSN 3/8X14X24FR PCFC (MISCELLANEOUS) ×2
CANN PRFSN 3/8XCNCT ST RT ANG (MISCELLANEOUS) ×2
CANNULA GUNDRY RCSP 15FR (MISCELLANEOUS) ×2 IMPLANT
CANNULA NON VENT 18FR 12 (CANNULA) ×1 IMPLANT
CANNULA PRFSN 3/8X14X24FR PCFC (MISCELLANEOUS) IMPLANT
CANNULA PRFSN 3/8XCNCT RT ANG (MISCELLANEOUS) IMPLANT
CANNULA SUMP PERICARDIAL (CANNULA) ×4 IMPLANT
CANNULA VEN MTL TIP RT (MISCELLANEOUS) ×2
CATH ROBINSON RED A/P 18FR (CATHETERS) ×11 IMPLANT
CNTNR URN SCR LID CUP LEK RST (MISCELLANEOUS) IMPLANT
CONN 1/2X1/2X1/2  BEN (MISCELLANEOUS) ×1
CONN 1/2X1/2X1/2 BEN (MISCELLANEOUS) ×2 IMPLANT
CONN ST 3/8 X 1/2 (MISCELLANEOUS) ×2 IMPLANT
CONT SPEC 4OZ STRL OR WHT (MISCELLANEOUS) ×2
CONTAINER PROTECT SURGISLUSH (MISCELLANEOUS) ×6 IMPLANT
COVER SURGICAL LIGHT HANDLE (MISCELLANEOUS) ×5 IMPLANT
DEVICE SUT CK QUICK LOAD MINI (Prosthesis & Implant Heart) IMPLANT
DRAPE WARM FLUID 44X44 (DRAPES) ×3 IMPLANT
DRSG AQUACEL AG ADV 3.5X10 (GAUZE/BANDAGES/DRESSINGS) ×1 IMPLANT
DRSG COVADERM 4X14 (GAUZE/BANDAGES/DRESSINGS) ×2 IMPLANT
ELECT REM PT RETURN 9FT ADLT (ELECTROSURGICAL) ×6
ELECTRODE REM PT RTRN 9FT ADLT (ELECTROSURGICAL) ×4 IMPLANT
FELT TEFLON 1X6 (MISCELLANEOUS) ×6 IMPLANT
GAUZE SPONGE 4X4 12PLY STRL (GAUZE/BANDAGES/DRESSINGS) ×5 IMPLANT
GLOVE SURG ENC MOIS LTX SZ6 (GLOVE) IMPLANT
GLOVE SURG ENC MOIS LTX SZ6.5 (GLOVE) IMPLANT
GLOVE SURG ENC MOIS LTX SZ7 (GLOVE) IMPLANT
GLOVE SURG ENC MOIS LTX SZ7.5 (GLOVE) IMPLANT
GLOVE SURG MICRO LTX SZ7 (GLOVE) ×6 IMPLANT
GOWN STRL REUS W/ TWL LRG LVL3 (GOWN DISPOSABLE) ×8 IMPLANT
GOWN STRL REUS W/ TWL XL LVL3 (GOWN DISPOSABLE) ×2 IMPLANT
GOWN STRL REUS W/TWL LRG LVL3 (GOWN DISPOSABLE) ×8
GOWN STRL REUS W/TWL XL LVL3 (GOWN DISPOSABLE) ×1
HEMOSTAT POWDER SURGIFOAM 1G (HEMOSTASIS) ×6 IMPLANT
HEMOSTAT SURGICEL 2X14 (HEMOSTASIS) ×2 IMPLANT
KIT BASIN OR (CUSTOM PROCEDURE TRAY) ×3 IMPLANT
KIT CATH CPB BARTLE (MISCELLANEOUS) ×2 IMPLANT
KIT DRAINAGE VACCUM ASSIST (KITS) ×1 IMPLANT
KIT SUCTION CATH 14FR (SUCTIONS) ×2 IMPLANT
KIT SUT CK MINI COMBO 4X17 (Prosthesis & Implant Heart) IMPLANT
KIT TURNOVER KIT B (KITS) ×3 IMPLANT
LEAD PACING MYOCARDI (MISCELLANEOUS) ×2 IMPLANT
LINE VENT (MISCELLANEOUS) ×1 IMPLANT
LOOP VESSEL SUPERMAXI WHITE (MISCELLANEOUS) ×1 IMPLANT
NS IRRIG 1000ML POUR BTL (IV SOLUTION) ×17 IMPLANT
PACK ACCESSORY CANNULA KIT (KITS) ×1 IMPLANT
PACK E OPEN HEART (SUTURE) ×3 IMPLANT
PACK OPEN HEART (CUSTOM PROCEDURE TRAY) ×3 IMPLANT
PAD ARMBOARD 7.5X6 YLW CONV (MISCELLANEOUS) ×6 IMPLANT
POSITIONER HEAD DONUT 9IN (MISCELLANEOUS) ×3 IMPLANT
RING TRICUSPID T28 (Prosthesis & Implant Heart) ×1 IMPLANT
SET MPS 3-ND DEL (MISCELLANEOUS) ×1 IMPLANT
SIZER CHORD-X CHORDAL CXCS (SIZER) IMPLANT
SOL PREP POV-IOD 4OZ 10% (MISCELLANEOUS) ×2 IMPLANT
SOL SCRUB PVP POV-IOD 4OZ 7.5% (MISCELLANEOUS) ×6
SOLUTION SCRB POV-IOD 4OZ 7.5% (MISCELLANEOUS) IMPLANT
SUT BONE WAX W31G (SUTURE) ×3 IMPLANT
SUT ETHIBOND (SUTURE) ×2 IMPLANT
SUT ETHIBOND 2 0 SH (SUTURE) IMPLANT
SUT ETHIBOND 2 0 SH 36X2 (SUTURE) IMPLANT
SUT ETHIBOND 2 0 V4 (SUTURE) IMPLANT
SUT ETHIBOND 2 0V4 GREEN (SUTURE) IMPLANT
SUT ETHIBOND 2-0 RB-1 WHT (SUTURE) ×2 IMPLANT
SUT GORETEX 5 0 TT13 24 (SUTURE) ×21 IMPLANT
SUT PROLENE 3 0 SH 1 (SUTURE) ×3 IMPLANT
SUT PROLENE 3 0 SH 48 (SUTURE) ×3 IMPLANT
SUT PROLENE 4 0 RB 1 (SUTURE) ×4
SUT PROLENE 4 0 SH DA (SUTURE) ×1 IMPLANT
SUT PROLENE 4-0 RB1 .5 CRCL 36 (SUTURE) ×4 IMPLANT
SUT STEEL 6MS V (SUTURE) ×2 IMPLANT
SUT STEEL SZ 6 DBL 3X14 BALL (SUTURE) IMPLANT
SUT VIC AB 1 CTX 27 (SUTURE) ×6 IMPLANT
SYSTEM SAHARA CHEST DRAIN ATS (WOUND CARE) ×3 IMPLANT
TAPE CLOTH SURG 4X10 WHT LF (GAUZE/BANDAGES/DRESSINGS) ×1 IMPLANT
TAPE PAPER 2X10 WHT MICROPORE (GAUZE/BANDAGES/DRESSINGS) ×1 IMPLANT
TOWEL GREEN STERILE (TOWEL DISPOSABLE) ×3 IMPLANT
TOWEL GREEN STERILE FF (TOWEL DISPOSABLE) ×3 IMPLANT
TRAY FOLEY SLVR 14FR TEMP STAT (SET/KITS/TRAYS/PACK) ×3 IMPLANT
UNDERPAD 30X36 HEAVY ABSORB (UNDERPADS AND DIAPERS) ×3 IMPLANT
WATER STERILE IRR 1000ML POUR (IV SOLUTION) ×6 IMPLANT

## 2021-01-17 NOTE — Transfer of Care (Signed)
Immediate Anesthesia Transfer of Care Note  Patient: Amber [name]  Procedure(s) Performed: TRICUSPID VALVE REPAIR USING EDWARDS MC3 28 TRICUSPID RING TRANSESOPHAGEAL ECHOCARDIOGRAM (TEE) APPLICATION OF CELL SAVER  Patient Location: SICU  Anesthesia Type:General  Level of Consciousness: Patient remains intubated per anesthesia plan  Airway & Oxygen Therapy: Patient placed on Ventilator (see vital sign flow sheet for setting)  Post-op Assessment: Report given to RN and Post -op Vital signs reviewed and stable  Post vital signs: Reviewed and stable  Last Vitals:  Vitals Value Taken Time  BP 112/83 01/17/21 1930  Temp 36.3 C 01/17/21 1936  Pulse 72 01/17/21 1936  Resp 13 01/17/21 1936  SpO2 100 % 01/17/21 1936  Vitals shown include unvalidated device data.  Last Pain:  Vitals:   01/17/21 1024  TempSrc: Oral  PainSc:       Patients Stated Pain Goal: 0 (01/17/21 1020)  Complications: No notable events documented.

## 2021-01-17 NOTE — Anesthesia Procedure Notes (Signed)
Central Venous Catheter Insertion Performed by: Oleta Mouse, MD, anesthesiologist Start/End1/05/2021 12:22 PM, 01/17/2021 12:32 PM Patient location: OR. Preanesthetic checklist: patient identified, IV checked, site marked, risks and benefits discussed, surgical consent, monitors and equipment checked, pre-op evaluation, timeout performed and anesthesia consent Hand hygiene performed  and maximum sterile barriers used  Catheter size: 8.5 Fr Sheath introducer Procedure performed using ultrasound guided technique. Ultrasound Notes:anatomy identified, needle tip was noted to be adjacent to the nerve/plexus identified, no ultrasound evidence of intravascular and/or intraneural injection and image(s) printed for medical record Attempts: 1 Following insertion, line sutured, dressing applied and Biopatch. Post procedure assessment: blood return through all ports, free fluid flow and no air  Patient tolerated the procedure well with no immediate complications.

## 2021-01-17 NOTE — Anesthesia Procedure Notes (Signed)
Central Venous Catheter Insertion Performed by: Oleta Mouse, MD, anesthesiologist Start/End1/05/2021 12:22 PM, 01/17/2021 12:32 PM Patient location: OR. Preanesthetic checklist: patient identified, IV checked, site marked, risks and benefits discussed, surgical consent, monitors and equipment checked, pre-op evaluation, timeout performed and anesthesia consent Central line was placed.Triple lumen Procedure performed without using ultrasound guided technique. Attempts: 1 Post procedure assessment: blood return through all ports and free fluid flow

## 2021-01-17 NOTE — Anesthesia Procedure Notes (Signed)
Arterial Line Insertion Start/End1/05/2021 11:00 AM, 01/17/2021 11:10 AM Performed by: Reece Agar, CRNA, CRNA  Patient location: Pre-op. Preanesthetic checklist: patient identified, IV checked, site marked, risks and benefits discussed, surgical consent, monitors and equipment checked, pre-op evaluation, timeout performed and anesthesia consent Lidocaine 1% used for infiltration Right, radial was placed Catheter size: 20 G Hand hygiene performed , maximum sterile barriers used  and Seldinger technique used Allen's test indicative of satisfactory collateral circulation Attempts: 1 Procedure performed without using ultrasound guided technique. Following insertion, dressing applied and Biopatch. Post procedure assessment: normal and unchanged  Patient tolerated the procedure well with no immediate complications.

## 2021-01-17 NOTE — Anesthesia Procedure Notes (Signed)
Procedure Name: Intubation Date/Time: 01/17/2021 1:34 PM Performed by: Reece Agar, CRNA Pre-anesthesia Checklist: Patient identified, Emergency Drugs available, Suction available and Patient being monitored Patient Re-evaluated:Patient Re-evaluated prior to induction Oxygen Delivery Method: Circle System Utilized Preoxygenation: Pre-oxygenation with 100% oxygen Induction Type: IV induction Ventilation: Mask ventilation without difficulty Laryngoscope Size: Mac and 3 Grade View: Grade I Tube type: Oral Tube size: 8.0 mm Number of attempts: 1 Airway Equipment and Method: Stylet Placement Confirmation: ETT inserted through vocal cords under direct vision, positive ETCO2 and breath sounds checked- equal and bilateral Secured at: 21 cm Tube secured with: Tape Dental Injury: Teeth and Oropharynx as per pre-operative assessment

## 2021-01-17 NOTE — H&P (Signed)
[address]       [place] [id number]             [phone]                                        Agustina Carolirica L Gaunce [facility] Medical Record #[id number] Date of Birth: 05/27/1990   Referring: Laurey MoraleMcLean, Dalton S, MD [facility]: [facility] Primary Cardiologist:Gayatri Wynell BalloonA Acharya, MD   Chief Complaint:        Chief Complaint  Patient presents with   Tricuspid Regurgitation      Surgical consult, ECHO 11/22/20, Cardiac Cath 09/25/20       No changes since her clinic appointment  Vitals:   01/17/21 1024  BP: 105/60  Pulse: 85  Resp: 18  Temp: 98 F (36.7 C)  SpO2: 99%   Alert NAD Sinus EWOB  OR today for tricuspid valve repair, possible replacement.  Corliss SkainsHarrell O Zafira Munos  History of Present Illness:     Ms. [name] comes in in follow-up.  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  She had severe tricuspid valve regurgitation prior to the angio vac procedure and this is persisted.  Her original procedure was in November 2021.  Since that point she has remained sober and off of IV drugs.  Her family continues to show strong support.  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  She recently underwent a metal rod in her tibia and has had good healing to this wound.   From a cardiovascular standpoint she now does admit to be short of breath with minimal activity.  Her echocardiogram from November 2022 shows that she continues to have severe tricuspid valve regurgitation with systolic reversal of flow within the hepatic veins.  She also has some evidence of right ventricular overload.  Her systolic function does remain normal but the ventricle is moderately enlarged.  Her alkaline phosphatase is elevated at 189.  Her bilirubin is  normal at 0.9     Past Medical and Surgical History: Previous Chest Surgery: No Previous Chest Radiation: No Diabetes Mellitus: No.  HbA1C pending Creatinine: 0.8       Past Medical History:  Diagnosis Date   Complication of anesthesia      Hard to wake up   Endocarditis     Hepatitis      Hep C   Heroin abuse (HCC)     History of kidney stones     Hypertension     MRSA (methicillin resistant staph aureus) culture positive     Ovarian cyst     UTI (lower urinary tract infection)             Past Surgical History:  Procedure Laterality Date   APPLICATION OF ANGIOVAC N/A 12/05/2019    Procedure: APPLICATION OF ANGIOVAC;  Surgeon: Corliss SkainsLightfoot, Kirsta Probert O, MD;  Location: [facility] OR;  Service: Vascular;  Laterality: N/A;  Patient will need intraoperative TEE Can be performed in OR 14, 15, or 17 if OR 16 is not available   CARDIAC CATHETERIZATION       FINGER SURGERY       HARDWARE REMOVAL Right 09/28/2020  Procedure: REMOVAL DEEP HARDWARE RIGHT TIBIA;  Surgeon: Nadara Mustarduda, Marcus V, MD;  [facility];  Service: Orthopedics;  Laterality: Right;   RIGHT HEART CATH N/A 09/25/2020    Procedure: RIGHT HEART CATH;  Surgeon: Laurey MoraleMcLean, Dalton S, MD;  [facility];  Service: Cardiovascular;  Laterality: N/A;   TEE WITHOUT CARDIOVERSION N/A 11/07/2019    Procedure: TRANSESOPHAGEAL ECHOCARDIOGRAM (TEE);  Surgeon: Wendall StadeNishan, Peter C, MD;  [facility];  Service: Cardiovascular;  Laterality: N/A;   TEE WITHOUT CARDIOVERSION   12/05/2019    Procedure: TRANSESOPHAGEAL ECHOCARDIOGRAM (TEE);  Surgeon: Corliss SkainsLightfoot, Taje Littler O, MD;  [facility];  Service: Vascular;;   TEE WITHOUT CARDIOVERSION N/A 12/05/2019    Procedure: TRANSESOPHAGEAL ECHOCARDIOGRAM (TEE);  Surgeon: Corliss SkainsLightfoot, Randolf Sansoucie O, MD;  [facility];  Service: Thoracic;  Laterality: N/A;      Social History: Support: Lives with her mother   Social History        Tobacco Use  Smoking Status Former   Types:  Cigarettes  Smokeless Tobacco Never    Social History       Substance and Sexual Activity  Alcohol Use No             Allergies  Allergen Reactions   Bee Venom Anaphylaxis   Other Anaphylaxis      Bee sting              Current Outpatient Medications  Medication Sig Dispense Refill   acetaminophen (TYLENOL) 500 MG tablet Take 1,000 mg by mouth every 6 (six) hours as needed for headache.        buprenorphine-naloxone (SUBOXONE) 8-2 mg SUBL SL tablet Place 2 tablets under the tongue daily.       diclofenac Sodium (VOLTAREN) 1 % GEL Apply 1 application topically as needed.       escitalopram (LEXAPRO) 20 MG tablet Take 1 tablet (20 mg total) by mouth at bedtime. 30 tablet 6   gabapentin (NEURONTIN) 600 MG tablet Take 600 mg by mouth at bedtime.       ibuprofen (ADVIL) 600 MG tablet Take 1 tablet (600 mg total) by mouth every 6 (six) hours as needed. 60 tablet 0   lidocaine (LIDODERM) 5 % Place 2 patches onto the skin daily as needed (pain). Remove & Discard patch within 12 hours or as directed by MD       Multiple Vitamin (MULTIVITAMIN WITH MINERALS) TABS tablet Take 1 tablet by mouth daily. 30 tablet 6   torsemide (DEMADEX) 10 MG tablet Take 1 tablet (10 mg total) by mouth daily as needed (AS NEEDED FOR SHORTNESS OF BREATH OR SWELLING). 30 tablet 3    No current facility-administered medications for this visit.      (Not in a hospital admission)          Family History  Problem Relation Age of Onset   Valvular heart disease Maternal Grandmother          Review of Systems:    Review of Systems  Constitutional:  Positive for malaise/fatigue.  Respiratory:  Positive for shortness of breath.   Cardiovascular:  Negative for chest pain.  Gastrointestinal:  Negative for abdominal pain.                 Physical Exam: BP 102/68    Pulse 70    Resp 20    Ht 5\' 1"  (1.549 m)    Wt 106 lb (48.1 kg)    SpO2 96% Comment: RA  BMI 20.03 kg/m  Physical Exam Constitutional:       General: She is not in acute distress.    Appearance: Normal appearance. She is normal weight. She is not ill-appearing.  HENT:     Head: Normocephalic and atraumatic.  Eyes:     Extraocular Movements: Extraocular movements intact.  Cardiovascular:     Rate and Rhythm: Normal rate.  Pulmonary:     Effort: Pulmonary effort is normal.  Abdominal:     General: Abdomen is flat. There is no distension.  Musculoskeletal:        General: Normal range of motion.     Cervical back: Normal range of motion.  Skin:    General: Skin is warm and dry.  Neurological:     General: No focal deficit present.     Mental Status: She is alert and oriented to person, place, and time.        Diagnostic Studies & Laboratory data:      Echo: IMPRESSIONS     1. Left ventricular ejection fraction, by estimation, is 55 to 60%. The  left ventricle has normal function. The left ventricle has no regional  wall motion abnormalities. Left ventricular diastolic parameters were  normal.   2. D-shaped interventricular septum in diastole, suggesting RV volume  overload. Right ventricular systolic function is normal. The right  ventricular size is moderately enlarged. There is normal pulmonary artery  systolic pressure. The estimated right  ventricular systolic pressure is 26.3 mmHg.   3. Right atrial size was mildly dilated.   4. The mitral valve is normal in structure. No evidence of mitral valve  regurgitation. No evidence of mitral stenosis.   5. The tricuspid valve is abnormal. Tricuspid valve regurgitation is  severe. Systolic flow reversal in the hepatic vein doppler pattern.   6. The aortic valve is tricuspid. Aortic valve regurgitation is not  visualized. No aortic stenosis is present.   7. The inferior vena cava is dilated in size with >50% respiratory  variability, suggesting right atrial pressure of 8 mmHg.    EKG: Sinus I have independently reviewed the above radiologic studies and  discussed with the patient    Recent Lab Findings: Recent Labs       Lab Results  Component Value Date    WBC 4.0 09/25/2020    HGB 13.6 09/25/2020    HCT 40.0 09/25/2020    PLT 170 09/25/2020    GLUCOSE 93 10/11/2020    TRIG 114 12/06/2019    ALT 16 10/11/2020    AST 30 10/11/2020    NA 137 10/11/2020    K 4.4 10/11/2020    CL 105 10/11/2020    CREATININE 0.80 10/11/2020    BUN 12 10/11/2020    CO2 23 10/11/2020    TSH 0.402 11/03/2019    INR 1.3 (H) 01/05/2020    HGBA1C 6.3 (H) 11/04/2019            Assessment / Plan:   This is a 31 year old female with 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  She does have a history of IV drug abuse but has had strong family support has remained clean for over a year.  On review of her echocardiogram she does have some RV overload and elevation alkaline phosphatase and INR.  We discussed the risks and benefits of surgical repair of her tricuspid valve and she is agreeable to proceed.  The plan is to use autologous pericardium, with an annuloplasty ring.  If this is unsuccessful then she will undergo a bioprosthetic valve replacement.  We discussed the risks and benefits of the procedure which include but need for permanent pacemaker.  She is tentatively scheduled for early January.         I  spent 15 minutes counseling the patient face to face.

## 2021-01-17 NOTE — Anesthesia Preprocedure Evaluation (Addendum)
Anesthesia Evaluation  Patient identified by MRN, date of birth, ID band Patient awake    Reviewed: Allergy & Precautions, NPO status , Patient's Chart, lab work & pertinent test results  History of Anesthesia Complications Negative for: history of anesthetic complications  Airway Mallampati: I  TM Distance: >3 FB Neck ROM: Full    Dental  (+) Dental Advisory Given, Teeth Intact   Pulmonary Patient abstained from smoking., former smoker,    breath sounds clear to auscultation       Cardiovascular hypertension,  Rhythm:Regular  1. Left ventricular ejection fraction, by estimation, is 55 to 60%. The  left ventricle has normal function. The left ventricle has no regional  wall motion abnormalities. Left ventricular diastolic parameters were  normal.  2. D-shaped interventricular septum in diastole, suggesting RV volume  overload. Right ventricular systolic function is normal. The right  ventricular size is moderately enlarged. There is normal pulmonary artery  systolic pressure. The estimated right  ventricular systolic pressure is 26.3 mmHg.  3. Right atrial size was mildly dilated.  4. The mitral valve is normal in structure. No evidence of mitral valve  regurgitation. No evidence of mitral stenosis.  5. The tricuspid valve is abnormal. Tricuspid valve regurgitation is  severe. Systolic flow reversal in the hepatic vein doppler pattern.  6. The aortic valve is tricuspid. Aortic valve regurgitation is not  visualized. No aortic stenosis is present.  7. The inferior vena cava is dilated in size with >50% respiratory  variability, suggesting right atrial pressure of 8 mmHg.    1. Normal PA pressure.  2. Normal PCWP 3. RA pressure normal but there are prominent V-waves suggestive of significant tricuspid regurgitation.   This study is suggestive of well-compensated tricuspid regurgitation.       Neuro/Psych PSYCHIATRIC DISORDERS Anxiety Depression negative neurological ROS     GI/Hepatic (+) Hepatitis -, CS/p treatment   Endo/Other  negative endocrine ROS  Renal/GU negative Renal ROSLab Results      Component                Value               Date                      CREATININE               0.70                01/15/2021                Musculoskeletal negative musculoskeletal ROS (+)   Abdominal   Peds  Hematology Lab Results      Component                Value               Date                      WBC                      6.6                 01/15/2021                HGB                      14.3  01/15/2021                HCT                      43.3                01/15/2021                MCV                      85.2                01/15/2021                PLT                      191                 01/15/2021              Anesthesia Other Findings   Reproductive/Obstetrics                            Anesthesia Physical Anesthesia Plan  ASA: 4  Anesthesia Plan: General   Post-op Pain Management:    Induction: Intravenous  PONV Risk Score and Plan: 3 and Ondansetron  Airway Management Planned: Oral ETT  Additional Equipment: Arterial line, CVP, TEE and Ultrasound Guidance Line Placement  Intra-op Plan:   Post-operative Plan: Post-operative intubation/ventilation  Informed Consent: I have reviewed the patients History and Physical, chart, labs and discussed the procedure including the risks, benefits and alternatives for the proposed anesthesia with the patient or authorized representative who has indicated his/her understanding and acceptance.     Dental advisory given  Plan Discussed with: Anesthesiologist, CRNA and Surgeon  Anesthesia Plan Comments:         Anesthesia Quick Evaluation

## 2021-01-17 NOTE — Hospital Course (Addendum)
History of Present Illness:  [name] is a 31 yo female with 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  She is well known to [facility] having undergone an Angio vac debridement of her Tricuspid Valve in November of 2021.  She recently suffered an infection in a previously placed tibial plate.  This ultimately had to be removed and she had a metal rod placed.  This has been healing without signs of infection.  She has been followed by Dr. Cliffton Asters and she admits to being short of breath with minimal activity.  Follow up Echocardiogram performed in November of 2022 continued to show severe Tricuspid valve regurgitation with systolic reversal of flow within her hepatic veins.  There is also evidence of right ventricular overload.  Her heart function remains preserved.  It was felt patient would benefit from surgical intervention on her Tricuspid Valve.  The plan will be to repair her native valve if possible.  If unsuccessful tricuspid valve replacement would be indicated.  The risks and benefits of the procedure were explained to the patient and she was agreeable to proceed.   Hospital Course:  [name] presents to [facility] on [facility]17[facility]2021.  She was taken to the operating room and underwent Tricuspid valve repair with autologous pericardium, reconstruction of the anterior and septal leaflet, placement of 4 neo-chords, insertion of MC3 65mm annuloplasty ring.  Transesophageal echocardiogram obtained in the operating room showed good function of the repair tricuspid valve.  The patient separated from cardiopulmonary bypass without difficulty.  Hemostasis was obtained.  She was transfused with 1 platelet pheresis immediately postop.  She was transferred to the surgical ICU in stable condition.  She was weaned from the ventilator and extubated without difficulty.  Hemodynamics remained stable.  Monitoring lines were removed on the first postoperative day.  Clear liquid diet  was initiated. EPW were removed on 01/07 and she was felt surgically stable for transfer from the ICU to 4E for further convalescence. Pain control remained her biggest issue. As of 01/08, she is on Oxy PRN, Lidocaine patch, Gabapentin 300 mg tid and at hs, and Robaxin tid.  Her pain control was much improved.  She remained in NSR with low blood pressure and BB was not started.  Her surgical incisions are healing w/o evidence of infection.  She is medically stable for discharge home today.

## 2021-01-17 NOTE — Progress Notes (Signed)
°  Echocardiogram Echocardiogram Transesophageal has been performed.  Delcie Roch 01/17/2021, 1:29 PM

## 2021-01-17 NOTE — Op Note (Signed)
[address]       Amber [name] [address]             [phone]                                           01/17/2021 Patient:  Amber [name] Pre-Op Dx: Severe tricuspid valve regurgitation Post-op Dx: Same Procedure: Tricuspid valve repair with autologous pericardium Reconstruction of the anterior and septal leaflet Placement of 4 neo-chords Insertion of MC3 3mm annuloplasty ring   Surgeon and Role:      * Corliss Skains, MD - Primary    * Dr. Donata Clay, MD - Assisting Surgeon   Anesthesia  general EBL: 750 ml Blood Administration: 1 unit of plts Xclamp Time:  136 min Pump Time:  179 min  Drains: 19 F blake drain x 2 mediastinal  Wires: atrial and ventricular Counts: correct   Indications: Amber [name] is a 31 year old female that was previously treated with angio VAC debridement of her tricuspid valve for tricuspid valve endocarditis in the setting of IV drug abuse.  Over the last year she has remained clean, but has developed worsening right heart failure and dilation.  Tricuspid valve repair was recommended.  Findings: The septal leaflet of the tricuspid valve was essentially destroyed.  This was a source of most of the regurgitation.  Part of the anterior leaflet also had some destruction with a flail segment.  The septal leaflet along with the portion of the anterior leaflet were resected.  After fixing the autologous pericardium in glutaraldehyde, we were able to reconstruct her leaflets.  Several needle cores were required.  There was only mild residual regurgitation at the completion of the case.  Operative Technique: All invasive lines were placed in pre-op holding.  After the risks, benefits and alternatives were thoroughly discussed, the patient was brought to the operative theatre.  Anesthesia was induced, and the patient was prepped and draped in normal sterile fashion.  An appropriate surgical pause was performed, and pre-operative  antibiotics were dosed accordingly.  We began with an incision over the chest for the sternotomy.  This was carried down with bovie cautery, and the sternum was divided with a reciprocating saw.  Meticulous hemostasis was obtained.  The patient was systemically heparinized.   The sternal retractor was placed.    We harvested a large portion of pericardium to use for repair.  It was fixed in glutaraldehyde.  We then fashion the remaining pericardium into a cradle.  After we confirmed an appropriate ACT the ascending aorta was cannulated in standard fashion.  We then used the SVC and IVC for bicaval cannulation.  Vesseloops were then passed around the IVC and SVC to fully isolate the right heart.  Cardiopulmonary bypass was initiated and we began to cool the patient to 32 degrees. The cross clamp was applied, and a dose of anterograde cardioplegia was given with good arrest of the heart.    And atriotomy was then performed and we exposed the tricuspid valve.  After thoroughly testing the we noted that the regurgitation was due to destruction of the septal leaflet and part of the anterior leaflet.  We then placed our annular stitches along the tricuspid valve ensuring not to injure the AV node.  This provided better exposure for leaflets as well.  We resected part of the  anterior leaflet along with the septal leaflet.  We then sewed our fixed pericardium to the annulus to cover the surface of resection.  We use Gore-Tex sutures for this.  We then placed and medial cords in the anterior and posterior papillary muscles.  These were then passed through the pericardial patch.  We then tied down our MC 328 mm annuloplasty ring, and secured our needle cords to the level of the annulus.  We thoroughly tested our valve and ensured that the knee records are not too tight.  There was some restriction in 1 year cord had to be removed.  Another one was placed along the septal edge.  At the completion of this we had minimal  leakage from the tricuspid valve.  We checked our valve function, and for air using the TEE.  Once we were satisfying, we separated from cardiopulmonary bypass without event.    The heparin was reversed with protamine, and hemostasis was obtained.  Chest tubes and wires were placed, and the sternum was re-approximated with with sternal wires.  The soft tissue and skin were re-approximated wth absorbable suture.    The patient tolerated the procedure without any immediate complications, and was transferred to the ICU in guarded condition.  Amber [name]

## 2021-01-17 NOTE — Brief Op Note (Signed)
°  01/17/2021  5:48 PM  PATIENT:  Amber [name]  31 y.o. female  PRE-OPERATIVE DIAGNOSIS:  SEVERE TRICUSPID REGURGITATION TRICUSPID VALVE ENDOCARDITIS  POST-OPERATIVE DIAGNOSIS:  SEVERE TRICUSPID REGURGITATION TRICUSPID VALVE ENDOCARDITIS  PROCEDURE:  Procedure(s): TRICUSPID VALVE REPAIR USING EDWARDS MC3 28 TRICUSPID RING (N/A) TRANSESOPHAGEAL ECHOCARDIOGRAM (TEE) (N/A) APPLICATION OF CELL SAVER  SURGEON:  Surgeon(s) and Role:    * Lightfoot, Lucile Crater, MD - Primary    * Dahlia Byes, MD - Assisting  ASSISTANTS: Staff RN's   ANESTHESIA:   general  EBL: 2065ml  BLOOD ADMINISTERED: 1 platelet pheresis, cell saver blood  DRAINS:  mediastinal drains    LOCAL MEDICATIONS USED:  NONE  SPECIMEN:  No Specimen  DISPOSITION OF SPECIMEN:  N/A  COUNTS:  YES  DICTATION: .Dragon Dictation  PLAN OF CARE: Admit to inpatient   PATIENT DISPOSITION:  ICU - intubated and hemodynamically stable.   Delay start of Pharmacological VTE agent (>24hrs) due to surgical blood loss or risk of bleeding: yes

## 2021-01-18 ENCOUNTER — Encounter (HOSPITAL_COMMUNITY): Payer: Self-pay | Admitting: Thoracic Surgery (Cardiothoracic Vascular Surgery)

## 2021-01-18 ENCOUNTER — Inpatient Hospital Stay (HOSPITAL_COMMUNITY): Payer: Self-pay

## 2021-01-18 LAB — CBC
HCT: 27.9 % — ABNORMAL LOW (ref 36.0–46.0)
HCT: 27.4 % — ABNORMAL LOW (ref 36.0–46.0)
Hemoglobin: 9.4 g/dL — ABNORMAL LOW (ref 12.0–15.0)
Hemoglobin: 8.7 g/dL — ABNORMAL LOW (ref 12.0–15.0)
MCH: 28.7 pg (ref 26.0–34.0)
MCH: 27.6 pg (ref 26.0–34.0)
MCHC: 33.7 g/dL (ref 30.0–36.0)
MCHC: 31.8 g/dL (ref 30.0–36.0)
MCV: 85.1 fL (ref 80.0–100.0)
MCV: 87 fL (ref 80.0–100.0)
Platelets: 90 10*3/uL — ABNORMAL LOW (ref 150–400)
Platelets: UNDETERMINED 10*3/uL (ref 150–400)
RBC: 3.28 MIL/uL — ABNORMAL LOW (ref 3.87–5.11)
RBC: 3.15 MIL/uL — ABNORMAL LOW (ref 3.87–5.11)
RDW: 14.7 % (ref 11.5–15.5)
RDW: 15.4 % (ref 11.5–15.5)
WBC: 8 10*3/uL (ref 4.0–10.5)
WBC: 6.6 10*3/uL (ref 4.0–10.5)
nRBC: 0 % (ref 0.0–0.2)
nRBC: 0 % (ref 0.0–0.2)

## 2021-01-18 LAB — POCT I-STAT 7, (LYTES, BLD GAS, ICA,H+H)
Acid-base deficit: 7 mmol/L — ABNORMAL HIGH (ref 0.0–2.0)
Acid-base deficit: 8 mmol/L — ABNORMAL HIGH (ref 0.0–2.0)
Bicarbonate: 16.5 mmol/L — ABNORMAL LOW (ref 20.0–28.0)
Bicarbonate: 16.9 mmol/L — ABNORMAL LOW (ref 20.0–28.0)
Calcium, Ion: 0.9 mmol/L — ABNORMAL LOW (ref 1.15–1.40)
Calcium, Ion: 0.98 mmol/L — ABNORMAL LOW (ref 1.15–1.40)
HCT: 20 % — ABNORMAL LOW (ref 36.0–46.0)
HCT: 21 % — ABNORMAL LOW (ref 36.0–46.0)
Hemoglobin: 6.8 g/dL — CL (ref 12.0–15.0)
Hemoglobin: 7.1 g/dL — ABNORMAL LOW (ref 12.0–15.0)
O2 Saturation: 100 %
O2 Saturation: 100 %
Patient temperature: 37.3
Patient temperature: 37.5
Potassium: 2.8 mmol/L — ABNORMAL LOW (ref 3.5–5.1)
Potassium: 2.8 mmol/L — ABNORMAL LOW (ref 3.5–5.1)
Sodium: 148 mmol/L — ABNORMAL HIGH (ref 135–145)
Sodium: 148 mmol/L — ABNORMAL HIGH (ref 135–145)
TCO2: 17 mmol/L — ABNORMAL LOW (ref 22–32)
TCO2: 18 mmol/L — ABNORMAL LOW (ref 22–32)
pCO2 arterial: 27.6 mmHg — ABNORMAL LOW (ref 32.0–48.0)
pCO2 arterial: 28.1 mmHg — ABNORMAL LOW (ref 32.0–48.0)
pH, Arterial: 7.386 (ref 7.350–7.450)
pH, Arterial: 7.39 (ref 7.350–7.450)
pO2, Arterial: 177 mmHg — ABNORMAL HIGH (ref 83.0–108.0)
pO2, Arterial: 195 mmHg — ABNORMAL HIGH (ref 83.0–108.0)

## 2021-01-18 LAB — BASIC METABOLIC PANEL
Anion gap: 5 (ref 5–15)
Anion gap: 3 — ABNORMAL LOW (ref 5–15)
BUN: 8 mg/dL (ref 6–20)
BUN: 7 mg/dL (ref 6–20)
CO2: 22 mmol/L (ref 22–32)
CO2: 26 mmol/L (ref 22–32)
Calcium: 7.5 mg/dL — ABNORMAL LOW (ref 8.9–10.3)
Calcium: 8 mg/dL — ABNORMAL LOW (ref 8.9–10.3)
Chloride: 111 mmol/L (ref 98–111)
Chloride: 105 mmol/L (ref 98–111)
Creatinine, Ser: 0.62 mg/dL (ref 0.44–1.00)
Creatinine, Ser: 0.6 mg/dL (ref 0.44–1.00)
GFR, Estimated: >60 mL/min (ref >60)
GFR, Estimated: >60 mL/min (ref >60)
Glucose, Bld: 129 mg/dL — ABNORMAL HIGH (ref 70–99)
Glucose, Bld: 103 mg/dL — ABNORMAL HIGH (ref 70–99)
Potassium: 3.8 mmol/L (ref 3.5–5.1)
Potassium: 3.6 mmol/L (ref 3.5–5.1)
Sodium: 138 mmol/L (ref 135–145)
Sodium: 134 mmol/L — ABNORMAL LOW (ref 135–145)

## 2021-01-18 LAB — PREPARE PLATELET PHERESIS: Unit division: 0

## 2021-01-18 LAB — GLUCOSE, CAPILLARY
Glucose-Capillary: 101 mg/dL — ABNORMAL HIGH (ref 70–99)
Glucose-Capillary: 109 mg/dL — ABNORMAL HIGH (ref 70–99)
Glucose-Capillary: 110 mg/dL — ABNORMAL HIGH (ref 70–99)
Glucose-Capillary: 116 mg/dL — ABNORMAL HIGH (ref 70–99)
Glucose-Capillary: 130 mg/dL — ABNORMAL HIGH (ref 70–99)
Glucose-Capillary: 95 mg/dL (ref 70–99)

## 2021-01-18 LAB — BPAM PLATELET PHERESIS
Blood Product Expiration Date: 202301072359
ISSUE DATE / TIME: 202301051834
Unit Type and Rh: 6200

## 2021-01-18 LAB — MAGNESIUM
Magnesium: 3 mg/dL — ABNORMAL HIGH (ref 1.7–2.4)
Magnesium: 2 mg/dL (ref 1.7–2.4)

## 2021-01-18 IMAGING — DX DG CHEST 1V PORT
1 series · 1 of 1 positions shown · non-contrast
Comparison: Chest x-ray [DATE]

CLINICAL DATA: Postop

EXAM:
PORTABLE CHEST 1 VIEW

[chest ap]
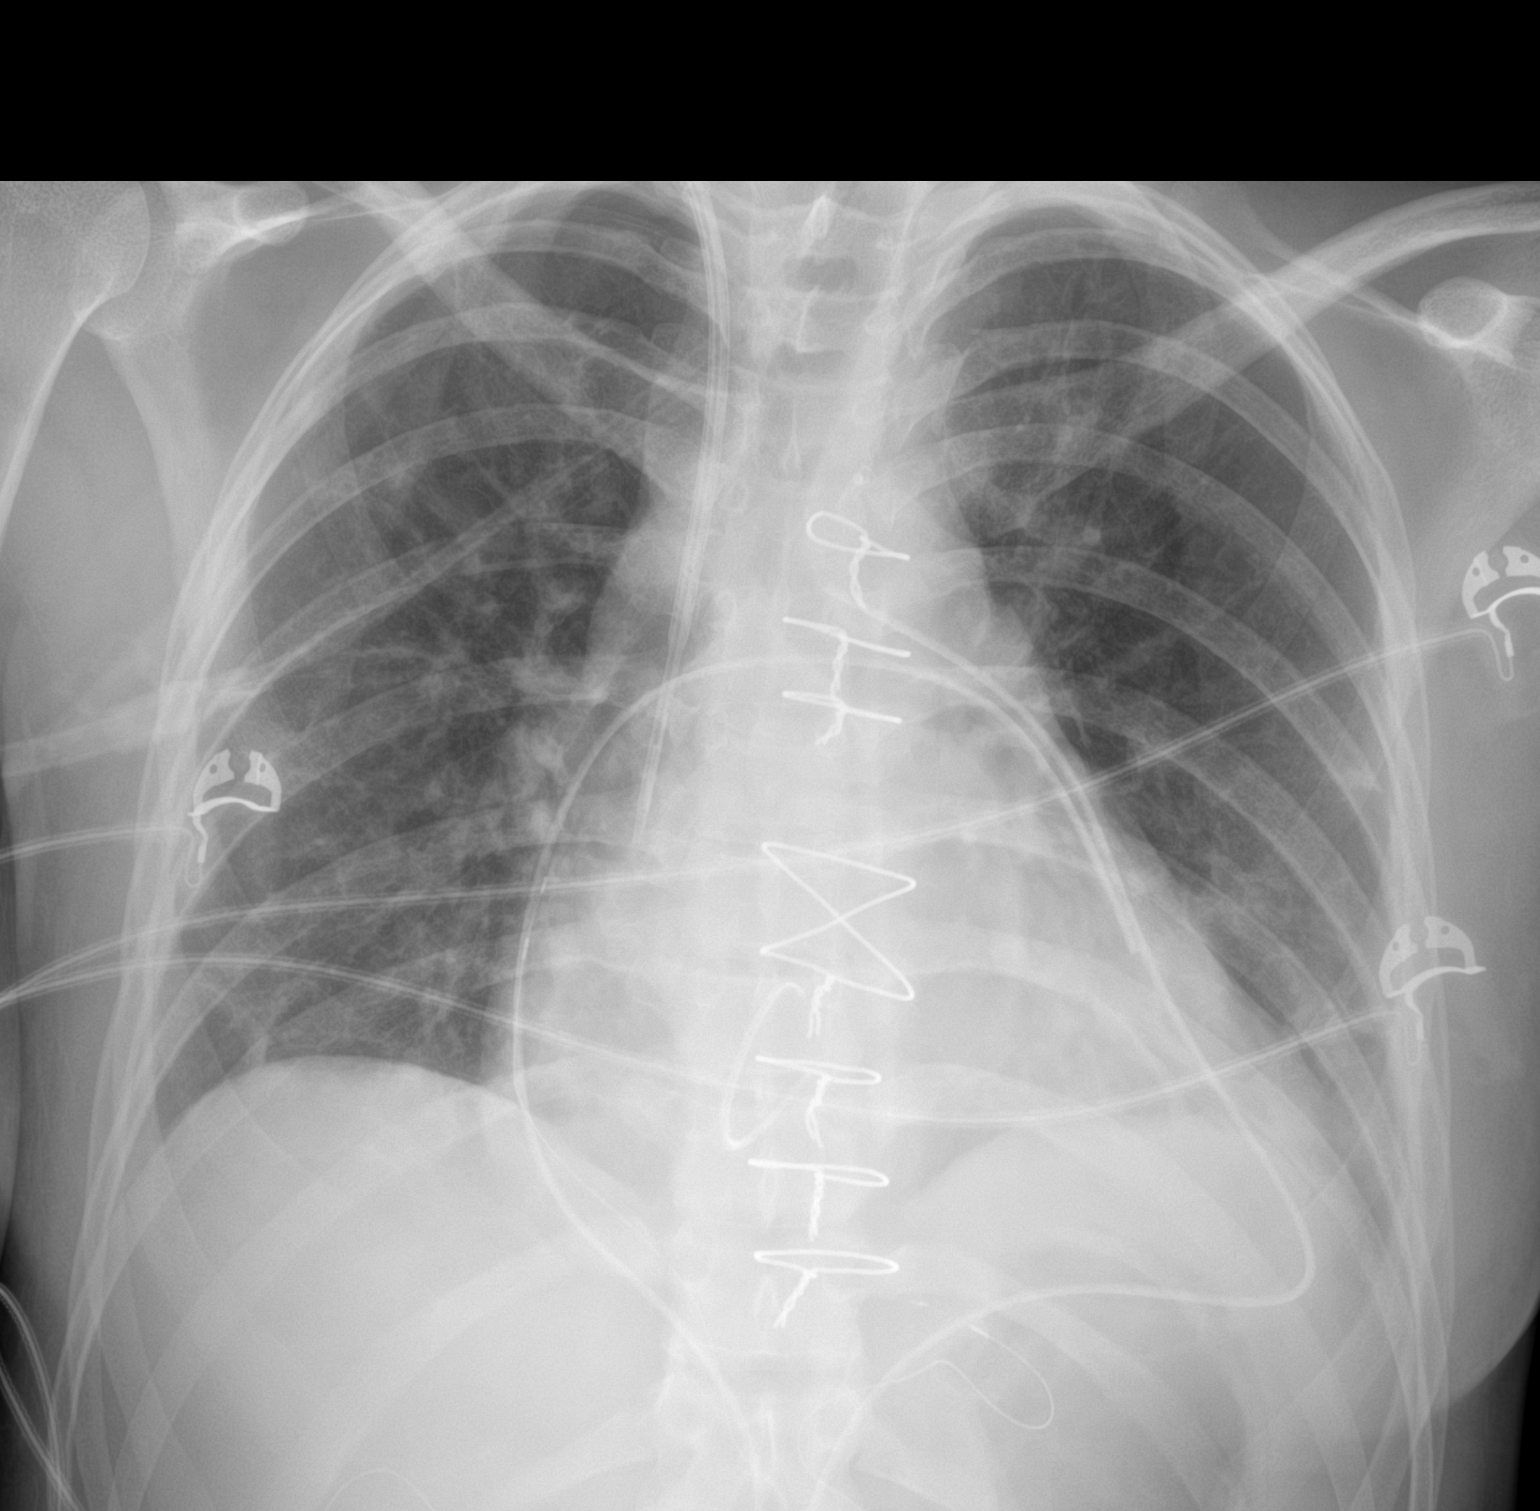

[1 of 1 positions shown; findings below may reference images not displayed]

FINDINGS: Endotracheal tube, enteric tube and a left-sided drain have been
removed. Remaining lines are stable. Cardiomediastinal silhouette is
unchanged. Pulmonary vasculature within normal limits. Opacities in
the lower lung zones left greater than right most likely represent
subsegmental atelectasis. No significant pleural effusion
visualized. No pneumothorax.
IMPRESSION: 1. Medical devices as described.
2. Bibasilar opacities left-greater-than-right, likely subsegmental
atelectasis.

## 2021-01-18 MED ORDER — FE FUMARATE-B12-VIT C-FA-IFC PO CAPS
1.0000 | ORAL_CAPSULE | Freq: Three times a day (TID) | ORAL | Status: DC
  Administered 2021-01-18 – 2021-01-21 (×9): 1 via ORAL
  Filled 2021-01-18 (×8): qty 1

## 2021-01-18 MED ORDER — ORAL CARE MOUTH RINSE
15.0000 mL | Freq: Two times a day (BID) | OROMUCOSAL | Status: DC
  Administered 2021-01-18 – 2021-01-21 (×7): 15 mL via OROMUCOSAL

## 2021-01-18 MED ORDER — ENOXAPARIN SODIUM 30 MG/0.3ML IJ SOSY
30.0000 mg | PREFILLED_SYRINGE | INTRAMUSCULAR | Status: DC
  Administered 2021-01-18 – 2021-01-20 (×3): 30 mg via SUBCUTANEOUS
  Filled 2021-01-18 (×3): qty 0.3

## 2021-01-18 MED FILL — Potassium Chloride Inj 2 mEq/ML: INTRAVENOUS | Qty: 40 | Status: AC

## 2021-01-18 MED FILL — Heparin Sodium (Porcine) Inj 1000 Unit/ML: INTRAMUSCULAR | Qty: 5000 | Status: AC

## 2021-01-18 MED FILL — Heparin Sodium (Porcine) Inj 1000 Unit/ML: Qty: 1000 | Status: AC

## 2021-01-18 MED FILL — Lidocaine HCl Local Preservative Free (PF) Inj 2%: INTRAMUSCULAR | Qty: 15 | Status: AC

## 2021-01-18 NOTE — Plan of Care (Signed)

## 2021-01-18 NOTE — Progress Notes (Addendum)
[facility] DAILY ICU PROGRESS NOTE                   [address]            [name] [address]          [phone]   1 Day Post-Op Procedure(s) (LRB): TRICUSPID VALVE REPAIR USING EDWARDS MC3 28 TRICUSPID RING (N/A) TRANSESOPHAGEAL ECHOCARDIOGRAM (TEE) (N/A) APPLICATION OF CELL SAVER  Total Length of Stay:  LOS: 1 day   Subjective: Extubated around midnight last night.  Off all vasoactive infusions.  Just returned from a walk in the unit.  Says pain control is adequate.   Objective: Vital signs in last 24 hours: Temp:  [96.3 F (35.7 C)-100 F (37.8 C)] 98.6 F (37 C) (01/06 1100) Pulse Rate:  [56-98] 75 (01/06 1200) Cardiac Rhythm: Normal sinus rhythm (01/06 1200) Resp:  [0-27] 19 (01/06 1200) BP: (83-148)/(47-98) 114/85 (01/06 1200) SpO2:  [96 %-100 %] 100 % (01/06 1200) Arterial Line BP: (94-160)/(43-94) 119/54 (01/06 1100) FiO2 (%):  [40 %-50 %] 40 % (01/05 2330) Weight:  [49.6 kg] 49.6 kg (01/06 0500)  Filed Weights   01/17/21 1024 01/18/21 0500  Weight: 48.1 kg 49.6 kg    Weight change:    Hemodynamic parameters for last 24 hours: CVP:  [0 mmHg-9 mmHg] 0 mmHg  Intake/Output from previous day: 01/05 0701 - 01/06 0700 In: 5150.6 [I.V.:2778.8; Blood:645; IV Piggyback:1726.8] Out: 4799 [Urine:2350; Blood:2094; Chest Tube:355]  Intake/Output this shift: Total I/O In: 240 [P.O.:240] Out: 60 [Urine:60]  Current Meds: Scheduled Meds:  sodium chloride   Intravenous Once   acetaminophen  1,000 mg Oral Q6H   Or   acetaminophen (TYLENOL) oral liquid 160 mg/5 mL  1,000 mg Per Tube Q6H   aspirin EC  325 mg Oral Daily   Or   aspirin  324 mg Per Tube Daily   bisacodyl  10 mg Oral Daily   Or   bisacodyl  10 mg Rectal Daily   Chlorhexidine Gluconate Cloth  6 each Topical Daily   docusate sodium  200 mg Oral Daily   escitalopram  20 mg Oral QHS   gabapentin  300 mg Oral QHS   insulin aspart  0-24 Units Subcutaneous Q4H   ketorolac  15 mg  Intravenous Q6H   mouth rinse  15 mL Mouth Rinse BID   metoprolol tartrate  12.5 mg Oral BID   Or   metoprolol tartrate  12.5 mg Per Tube BID   [START ON 01/19/2021] pantoprazole  40 mg Oral Daily   sodium chloride flush  10-40 mL Intracatheter Q12H   sodium chloride flush  3 mL Intravenous Q12H   Continuous Infusions:  sodium chloride Stopped (01/17/21 2124)   sodium chloride     sodium chloride      ceFAZolin (ANCEF) IV Stopped (01/18/21 0557)   lactated ringers     lactated ringers     lactated ringers Stopped (01/18/21 0620)   nitroGLYCERIN Stopped (01/17/21 2131)   PRN Meds:.sodium chloride, lactated ringers, metoprolol tartrate, midazolam, morphine injection, ondansetron (ZOFRAN) IV, oxyCODONE, sodium chloride flush, sodium chloride flush, traMADol  General appearance: alert, cooperative, and no distress Neurologic: intact Heart: RRR, no murmur. Had some bradycardia last night and ealier this morning.  Temp pacer set at VVI 50. Lungs: breath sounds are shallow but clear. Ct drainage over night, tapering off. Bloody drainage in the tube now.   Abdomen: soft, NT Extremities: warm and well perfused, trace  edema Wound: the sternotomy incision is covered with a dry Aquacel dressing  Lab Results: CBC: Recent Labs    01/17/21 1948 01/17/21 1951 01/18/21 0114 01/18/21 0120  WBC 11.7*  --   --  8.0  HGB 11.3*   < > 6.8* 9.4*  HCT 34.0*   < > 20.0* 27.9*  PLT 95*  --   --  90*   < > = values in this interval not displayed.   BMET:  Recent Labs    01/15/21 1503 01/17/21 1339 01/17/21 1824 01/17/21 1827 01/18/21 0114 01/18/21 0120  NA 135   < > 141   < > 148* 138  K 4.1   < > 3.2*   < > 2.8* 3.8  CL 107   < > 106  --   --  111  CO2 21*  --   --   --   --  22  GLUCOSE 96   < > 204*  --   --  129*  BUN 7   < > 8  --   --  8  CREATININE 0.70   < > 0.30*  --   --  0.62  CALCIUM 9.3  --   --   --   --  7.5*   < > = values in this interval not displayed.     CMET: Lab Results  Component Value Date   WBC 8.0 01/18/2021   HGB 9.4 (L) 01/18/2021   HCT 27.9 (L) 01/18/2021   PLT 90 (L) 01/18/2021   GLUCOSE 129 (H) 01/18/2021   TRIG 114 12/06/2019   ALT 14 01/15/2021   AST 23 01/15/2021   NA 138 01/18/2021   K 3.8 01/18/2021   CL 111 01/18/2021   CREATININE 0.62 01/18/2021   BUN 8 01/18/2021   CO2 22 01/18/2021   TSH 0.402 11/03/2019   INR 1.8 (H) 01/17/2021   HGBA1C 5.6 01/15/2021      PT/INR:  Recent Labs    01/17/21 1948  LABPROT 20.5*  INR 1.8*   Radiology: DG Chest Port 1 View  Result Date: 01/18/2021 CLINICAL DATA:  Postop EXAM: PORTABLE CHEST 1 VIEW COMPARISON:  Chest x-ray 01/17/2021 FINDINGS: Endotracheal tube, enteric tube and a left-sided drain have been removed. Remaining lines are stable. Cardiomediastinal silhouette is unchanged. Pulmonary vasculature within normal limits. Opacities in the lower lung zones left greater than right most likely represent subsegmental atelectasis. No significant pleural effusion visualized. No pneumothorax. IMPRESSION: 1. Medical devices as described. 2. Bibasilar opacities left-greater-than-right, likely subsegmental atelectasis. Electronically Signed   By: Jannifer Hick M.D.   On: 01/18/2021 08:34   DG Chest Port 1 View  Result Date: 01/17/2021 CLINICAL DATA:  Tricuspid valve repair EXAM: PORTABLE CHEST 1 VIEW COMPARISON:  01/15/2021 FINDINGS: Single frontal view of the chest demonstrates endotracheal tube overlying tracheal air column tip midway between thoracic inlet and carina. Enteric catheter passes below diaphragm, side port projecting over gastric fundus and tip excluded by collimation. Right internal jugular catheter tip overlies superior vena cava. Pericardial drains are identified. Postsurgical changes from median sternotomy and tricuspid valve repair. Epicardial pacing wires are noted. Mild central vascular congestion. No airspace disease, effusion, or pneumothorax. No acute bony  abnormalities. IMPRESSION: 1. Postsurgical changes as above. 2. Mild vascular congestion without overt edema. Electronically Signed   By: Sharlet Salina M.D.   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 Stable hemodynamics since surgery, not requiring any vasoactive support. She has had several episodes of bradycardia requiring pacer backup but currently in stable SR in the mid 60's. Avoid beta blockers for now. D/C monitoring lines and Foley catheter.   -PULM- no dyspnea and good sats on RA after a walk in the hall. Encourage pulm hygiene.  -HEME- Expected acute blood loss anemia and thrombocytopenia. Tolerating well, minimal ongoing losses. Start Trinsicon.  Monitor. Enoxaparin for DVT PPX to start this PM.   -ENDO-No h/o DM. Glucose well controlled. Monitor and provie SSI for another 24 hours.   -RENAL- Stable creat and appropriate UO. Monitor.   -GI- tolerating cl liquids. Advance as tolerated.   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 Lexapro resumed for anxiety. Pain has been controlled  with Toradol, morphine, and oxycodone since surgery. Working toward transitioning to non-narcotic pain mgt with Toradol and Tylenol.     Leary RocaMyron G. Roddenberry, PA-C [phone] 01/18/2021 2:06 PM   Agree with above Overall doing well. Extubated overnight, sinus rhythm Will adjust pain medication with goal of minimizing narcotics given her substance abuse history Ambulation today.  Tauni Sanks [name] Marletta Bousquet

## 2021-01-18 NOTE — Procedures (Signed)
Extubation Procedure Note  Pt extubated following Cardiac wean. VC 1.4L, NIF -30, Cuff leak +, ABG +, No stridor.   Patient Details:   Name: Amber [name] DOB: 1990/06/21 MRN: [id number]   Airway Documentation:    Vent end date: 01/18/21 Vent end time: 0004   Evaluation  O2 sats: stable throughout Complications: No apparent complications Patient did tolerate procedure well. Bilateral Breath Sounds: Clear, Diminished   Yes  Lianne Bushy 01/18/2021, 12:04 AM

## 2021-01-19 LAB — GLUCOSE, CAPILLARY
Glucose-Capillary: 86 mg/dL (ref 70–99)
Glucose-Capillary: 130 mg/dL — ABNORMAL HIGH (ref 70–99)
Glucose-Capillary: 98 mg/dL (ref 70–99)
Glucose-Capillary: 115 mg/dL — ABNORMAL HIGH (ref 70–99)

## 2021-01-19 LAB — ACID FAST SMEAR (AFB, MYCOBACTERIA): Acid Fast Smear: NEGATIVE

## 2021-01-19 MED ORDER — METHOCARBAMOL 500 MG PO TABS
500.0000 mg | ORAL_TABLET | Freq: Three times a day (TID) | ORAL | Status: DC
  Administered 2021-01-19 – 2021-01-21 (×7): 500 mg via ORAL
  Filled 2021-01-19 (×7): qty 1

## 2021-01-19 MED ORDER — ~~LOC~~ CARDIAC SURGERY, PATIENT & FAMILY EDUCATION: Freq: Once | Status: AC

## 2021-01-19 MED ORDER — SODIUM CHLORIDE 0.9% FLUSH
3.0000 mL | Freq: Two times a day (BID) | INTRAVENOUS | Status: DC
  Administered 2021-01-19 – 2021-01-21 (×4): 3 mL via INTRAVENOUS

## 2021-01-19 MED ORDER — LIDOCAINE 5 % EX PTCH
2.0000 | MEDICATED_PATCH | CUTANEOUS | Status: DC
  Administered 2021-01-19 – 2021-01-21 (×3): 2 via TRANSDERMAL
  Filled 2021-01-19 (×3): qty 2

## 2021-01-19 MED ORDER — GABAPENTIN 300 MG PO CAPS
300.0000 mg | ORAL_CAPSULE | Freq: Three times a day (TID) | ORAL | Status: DC
  Administered 2021-01-19 – 2021-01-21 (×7): 300 mg via ORAL
  Filled 2021-01-19 (×7): qty 1

## 2021-01-19 MED ORDER — SODIUM CHLORIDE 0.9% FLUSH: 3.0000 mL | INTRAVENOUS | Status: DC | PRN

## 2021-01-19 MED ORDER — FUROSEMIDE 10 MG/ML IJ SOLN
40.0000 mg | Freq: Once | INTRAMUSCULAR | Status: AC
  Administered 2021-01-19: 40 mg via INTRAVENOUS
  Filled 2021-01-19: qty 4

## 2021-01-19 MED ORDER — SODIUM CHLORIDE 0.9 % IV SOLN: 250.0000 mL | INTRAVENOUS | Status: DC | PRN

## 2021-01-19 NOTE — Progress Notes (Signed)
Chest tubes x2 removed per order. No complications with removal. VSS, call bell within reach. No other questions from patients.

## 2021-01-19 NOTE — Progress Notes (Signed)
Pt arrived from 2H to 4E10 after tricuspid valve repair w/Dr. Cliffton Asters on 1/5.  Telemetry monitor applied and CCMD notified.  CHG bath and skin assessment completed.  Patient oriented to unit and room to include call light and phone.  Will continue to monitor.

## 2021-01-19 NOTE — Progress Notes (Signed)
° °   °  [address]       [place] [address]             [phone]                 2 Days Post-Op Procedure(s) (LRB): TRICUSPID VALVE REPAIR USING EDWARDS MC3 28 TRICUSPID RING (N/A) TRANSESOPHAGEAL ECHOCARDIOGRAM (TEE) (N/A) APPLICATION OF CELL SAVER   Events: No events _______________________________________________________________ Vitals: BP 122/76    Pulse 69    Temp 98.5 F (36.9 C) (Oral)    Resp (!) 21    Ht 5\' 1"  (1.549 m)    Wt 48.3 kg    SpO2 100%    BMI 20.12 kg/m  Filed Weights   01/17/21 1024 01/18/21 0500 01/19/21 0500  Weight: 48.1 kg 49.6 kg 48.3 kg     - Neuro: alert NAd  - Cardiovascular: sinus  Drips: none.   CVP:  [0 mmHg-3 mmHg] 0 mmHg  - Pulm: EWOB    ABG    Component Value Date/Time   PHART 7.390 01/18/2021 0114   PCO2ART 28.1 (L) 01/18/2021 0114   PO2ART 177 (H) 01/18/2021 0114   HCO3 16.9 (L) 01/18/2021 0114   TCO2 18 (L) 01/18/2021 0114   ACIDBASEDEF 7.0 (H) 01/18/2021 0114   O2SAT 100.0 01/18/2021 0114    - Abd: ND - Extremity: warm  .Intake/Output      01/06 0701 01/07 0700 01/07 0701 01/08 0700   P.O. 820 240   I.V. (mL/kg) 10 (0.2)    Blood     IV Piggyback 200    Total Intake(mL/kg) 1030 (21.3) 240 (5)   Urine (mL/kg/hr) 1620 (1.4)    Blood     Chest Tube 230    Total Output 1850    Net -820 +240        Urine Occurrence  2 x      _______________________________________________________________ Labs: CBC Latest Ref Rng & Units 01/18/2021 01/18/2021 01/18/2021  WBC 4.0 - 10.5 K/uL 6.6 8.0 -  Hemoglobin 12.0 - 15.0 g/dL 8.7(L) 9.4(L) 6.8(LL)  Hematocrit 36.0 - 46.0 % 27.4(L) 27.9(L) 20.0(L)  Platelets 150 - 400 K/uL PLATELET CLUMPS NOTED ON SMEAR, UNABLE TO ESTIMATE 90(L) -   CMP Latest Ref Rng & Units 01/18/2021 01/18/2021 01/18/2021  Glucose 70 - 99 mg/dL 103(H) 129(H) -  BUN 6 - 20 mg/dL 7 8 -  Creatinine 0.44 - 1.00 mg/dL 0.60 0.62 -  Sodium 135 - 145 mmol/L 134(L) 138 148(H)  Potassium 3.5 - 5.1  mmol/L 3.6 3.8 2.8(L)  Chloride 98 - 111 mmol/L 105 111 -  CO2 22 - 32 mmol/L 26 22 -  Calcium 8.9 - 10.3 mg/dL 8.0(L) 7.5(L) -  Total Protein 6.5 - 8.1 g/dL - - -  Total Bilirubin 0.3 - 1.2 mg/dL - - -  Alkaline Phos 38 - 126 U/L - - -  AST 15 - 41 U/L - - -  ALT 0 - 44 U/L - - -    CXR: stable  _______________________________________________________________  Assessment and Plan: POD 1 s/p tricuspid valve repair  Neuro: adjusting pain medication CV: will remove wires Pulm: will remove chest tube Renal: gentle diuresis today GI: on diet Heme: stable ID: [email or web address] Endo: will D.C SSI Dispo: floor today   Lajuana Matte 01/19/2021 9:39 AM

## 2021-01-19 NOTE — Progress Notes (Signed)
Pacer wires removed per order. VSS, nonsustained 3 beat run of vtach recorded upon removal. Pt advised to lay in bed for one hour post removal and to call nurse with any new pain or discomforts. Call bell within reach. Pt had no other questions.

## 2021-01-20 LAB — CBC
HCT: 29.9 % — ABNORMAL LOW (ref 36.0–46.0)
Hemoglobin: 9.8 g/dL — ABNORMAL LOW (ref 12.0–15.0)
MCH: 27.8 pg (ref 26.0–34.0)
MCHC: 32.8 g/dL (ref 30.0–36.0)
MCV: 84.9 fL (ref 80.0–100.0)
Platelets: 108 10*3/uL — ABNORMAL LOW (ref 150–400)
RBC: 3.52 MIL/uL — ABNORMAL LOW (ref 3.87–5.11)
RDW: 14.9 % (ref 11.5–15.5)
WBC: 8.1 10*3/uL (ref 4.0–10.5)
nRBC: 0 % (ref 0.0–0.2)

## 2021-01-20 LAB — BASIC METABOLIC PANEL
Anion gap: 7 (ref 5–15)
BUN: 11 mg/dL (ref 6–20)
CO2: 27 mmol/L (ref 22–32)
Calcium: 8.9 mg/dL (ref 8.9–10.3)
Chloride: 102 mmol/L (ref 98–111)
Creatinine, Ser: 0.7 mg/dL (ref 0.44–1.00)
GFR, Estimated: >60 mL/min (ref >60)
Glucose, Bld: 106 mg/dL — ABNORMAL HIGH (ref 70–99)
Potassium: 3.2 mmol/L — ABNORMAL LOW (ref 3.5–5.1)
Sodium: 136 mmol/L (ref 135–145)

## 2021-01-20 MED ORDER — GABAPENTIN 300 MG PO CAPS
300.0000 mg | ORAL_CAPSULE | Freq: Every day | ORAL | Status: DC
  Administered 2021-01-20: 300 mg via ORAL
  Filled 2021-01-20: qty 1

## 2021-01-20 NOTE — Progress Notes (Addendum)
° °   °  [address]       [place] [address]             [phone]        3 Days Post-Op Procedure(s) (LRB): TRICUSPID VALVE REPAIR USING EDWARDS MC3 28 TRICUSPID RING (N/A) TRANSESOPHAGEAL ECHOCARDIOGRAM (TEE) (N/A) APPLICATION OF CELL SAVER  Subjective: Patient walked yesterday. Pain control is her biggest issues along with not much appetite.  Objective: Vital signs in last 24 hours: Temp:  [98.3 F (36.8 C)-98.8 F (37.1 C)] 98.3 F (36.8 C) (01/08 0807) Pulse Rate:  [76-99] 99 (01/08 0807) Cardiac Rhythm: Normal sinus rhythm (01/07 1950) Resp:  [8-27] 17 (01/08 0807) BP: (111-128)/(68-94) 111/68 (01/08 0807) SpO2:  [93 %-100 %] 100 % (01/08 0807) Weight:  [44.7 kg] 44.7 kg (01/08 0625)  Pre op weight 48.1 kg Current Weight  01/20/21 44.7 kg       Intake/Output from previous day: 01/07 0701 - 01/08 0700 In: [id number] [P.O.:840; I.V.:603] Out: 940 [Urine:900; Chest Tube:40]   Physical Exam:  Cardiovascular: RRR, no murmur Pulmonary: Clear to auscultation bilaterally Abdomen: Soft, non tender, bowel sounds present. Extremities: No lower extremity edema. Wounds: Aquacel removed and sternal wound is clean and dry.  No erythema or signs of infection. Right chest tube wound with bloody ooze  Lab Results: CBC: Recent Labs    01/18/21 1713 01/20/21 0137  WBC 6.6 8.1  HGB 8.7* 9.8*  HCT 27.4* 29.9*  PLT PLATELET CLUMPS NOTED ON SMEAR, UNABLE TO ESTIMATE 108*   BMET:  Recent Labs    01/18/21 1713 01/20/21 0137  NA 134* 136  K 3.6 3.2*  CL 105 102  CO2 26 27  GLUCOSE 103* 106*  BUN 7 11  CREATININE 0.60 0.70  CALCIUM 8.0* 8.9    PT/INR:  Lab Results  Component Value Date   INR 1.8 (H) 01/17/2021   INR 1.1 01/15/2021   INR 1.3 (H) 01/05/2020   ABG:  INR: Will add last result for INR, ABG once components are confirmed Will add last 4 CBG results once components are confirmed  Assessment/Plan:  1. CV - SR with HR in the  90's. 2.  Pulmonary - On room air. Check PA/LAT CXR in am. Encourage incentive spirometer 3.  Expected post op acute blood loss anemia - H and H this am increased to 9.8 and 29.9. Continue Trinsicon 4. Supplement potassium 5. Thrombocytopenia-platelets this am up to 108,000 6. Regarding pain control, Gabapentin 300 mg tid and at hs, Lidoderm patch, Robaxin tid, and Oxy PRN. Once pain under better control, will consider discharge. 7. Encourage po, continue protein shakes  Amber M ZimmermanPA-C 01/20/2021,8:24 AM    Agree with above. Currently doing well. Dispo planning.  Amber [name]

## 2021-01-21 ENCOUNTER — Inpatient Hospital Stay (HOSPITAL_COMMUNITY): Payer: Self-pay

## 2021-01-21 LAB — SURGICAL PATHOLOGY

## 2021-01-21 IMAGING — CR DG CHEST 2V
2 series · 2 of 2 positions shown · non-contrast
Comparison: [DATE].

CLINICAL DATA: Pleural effusion.

EXAM:
CHEST - 2 VIEW

[chest lat]
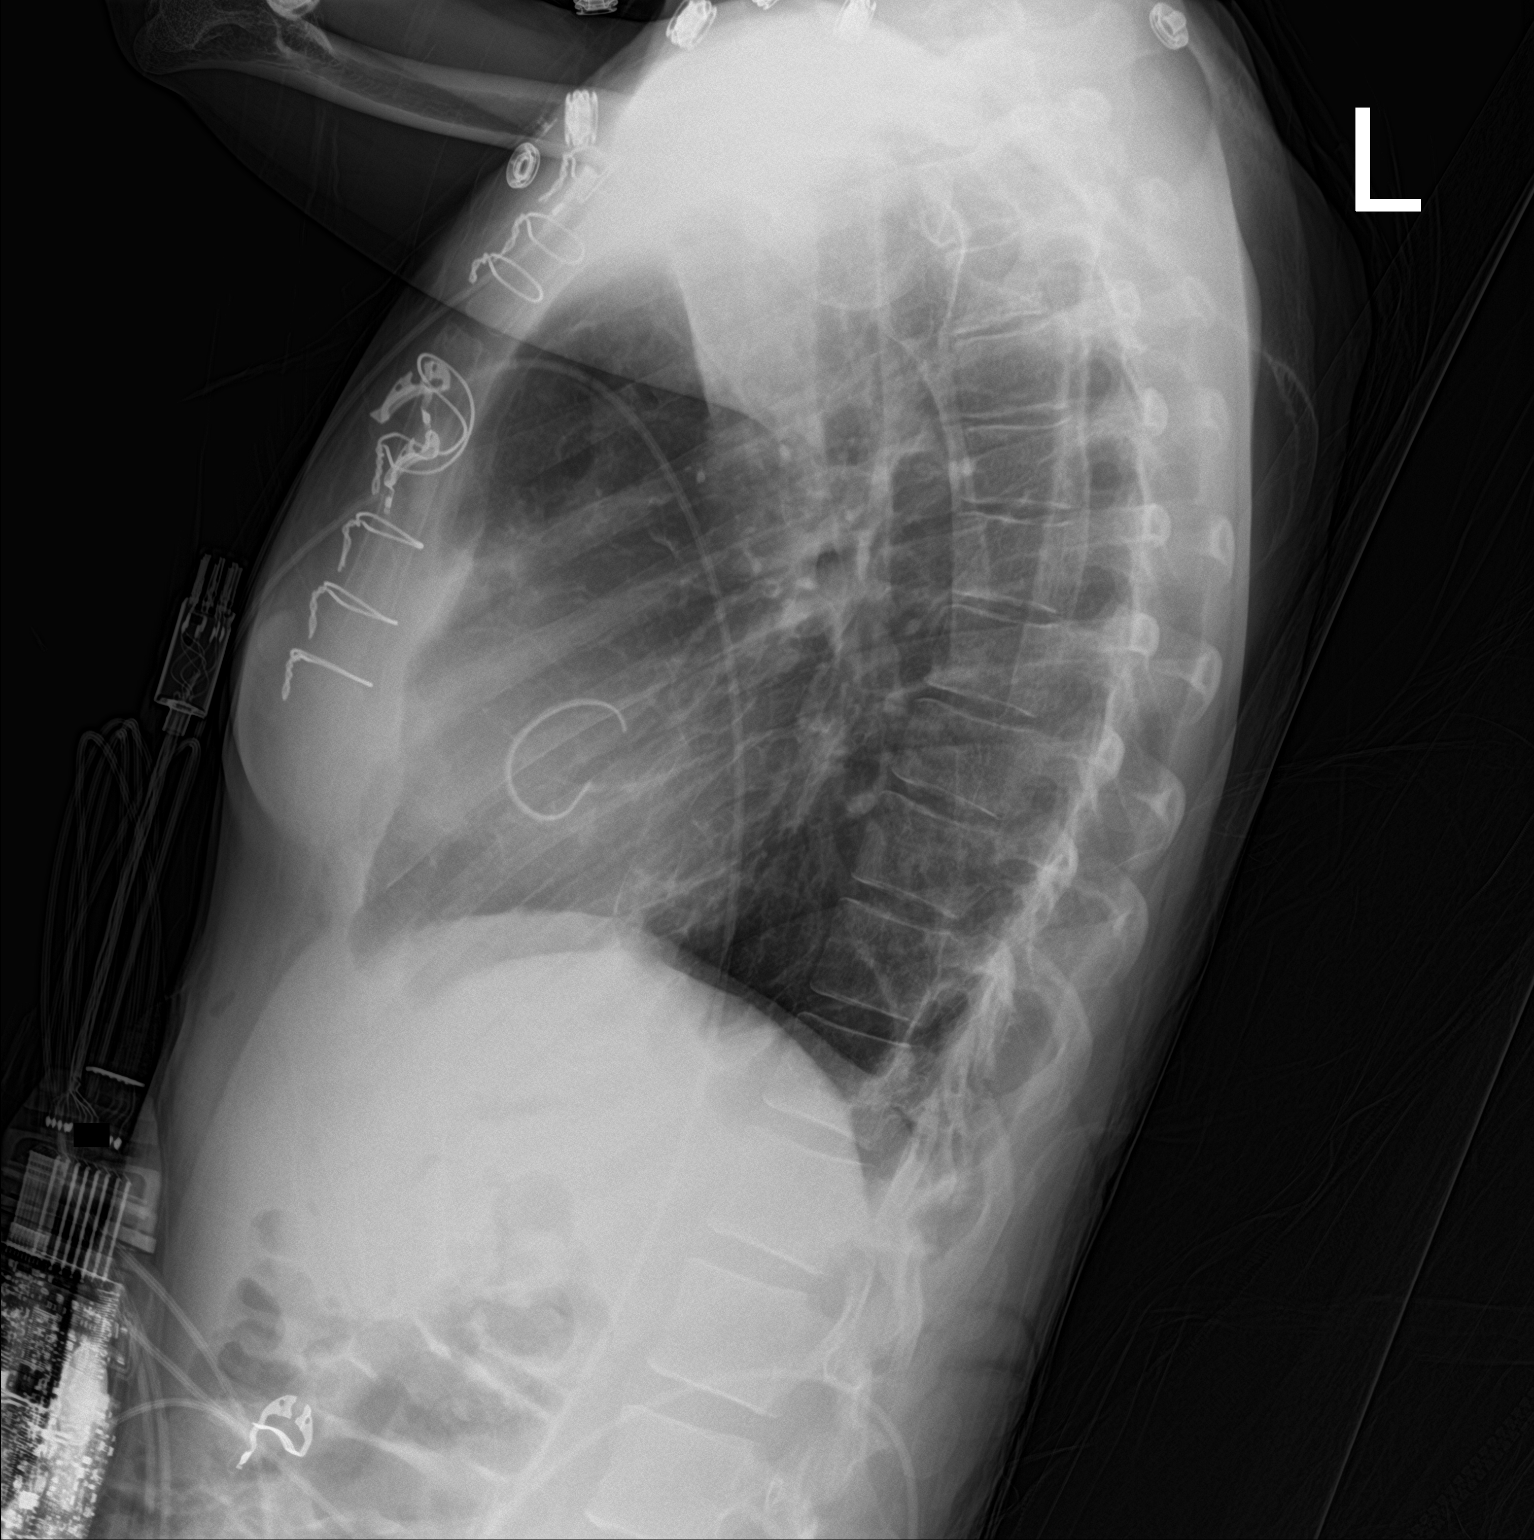

[chest ap]
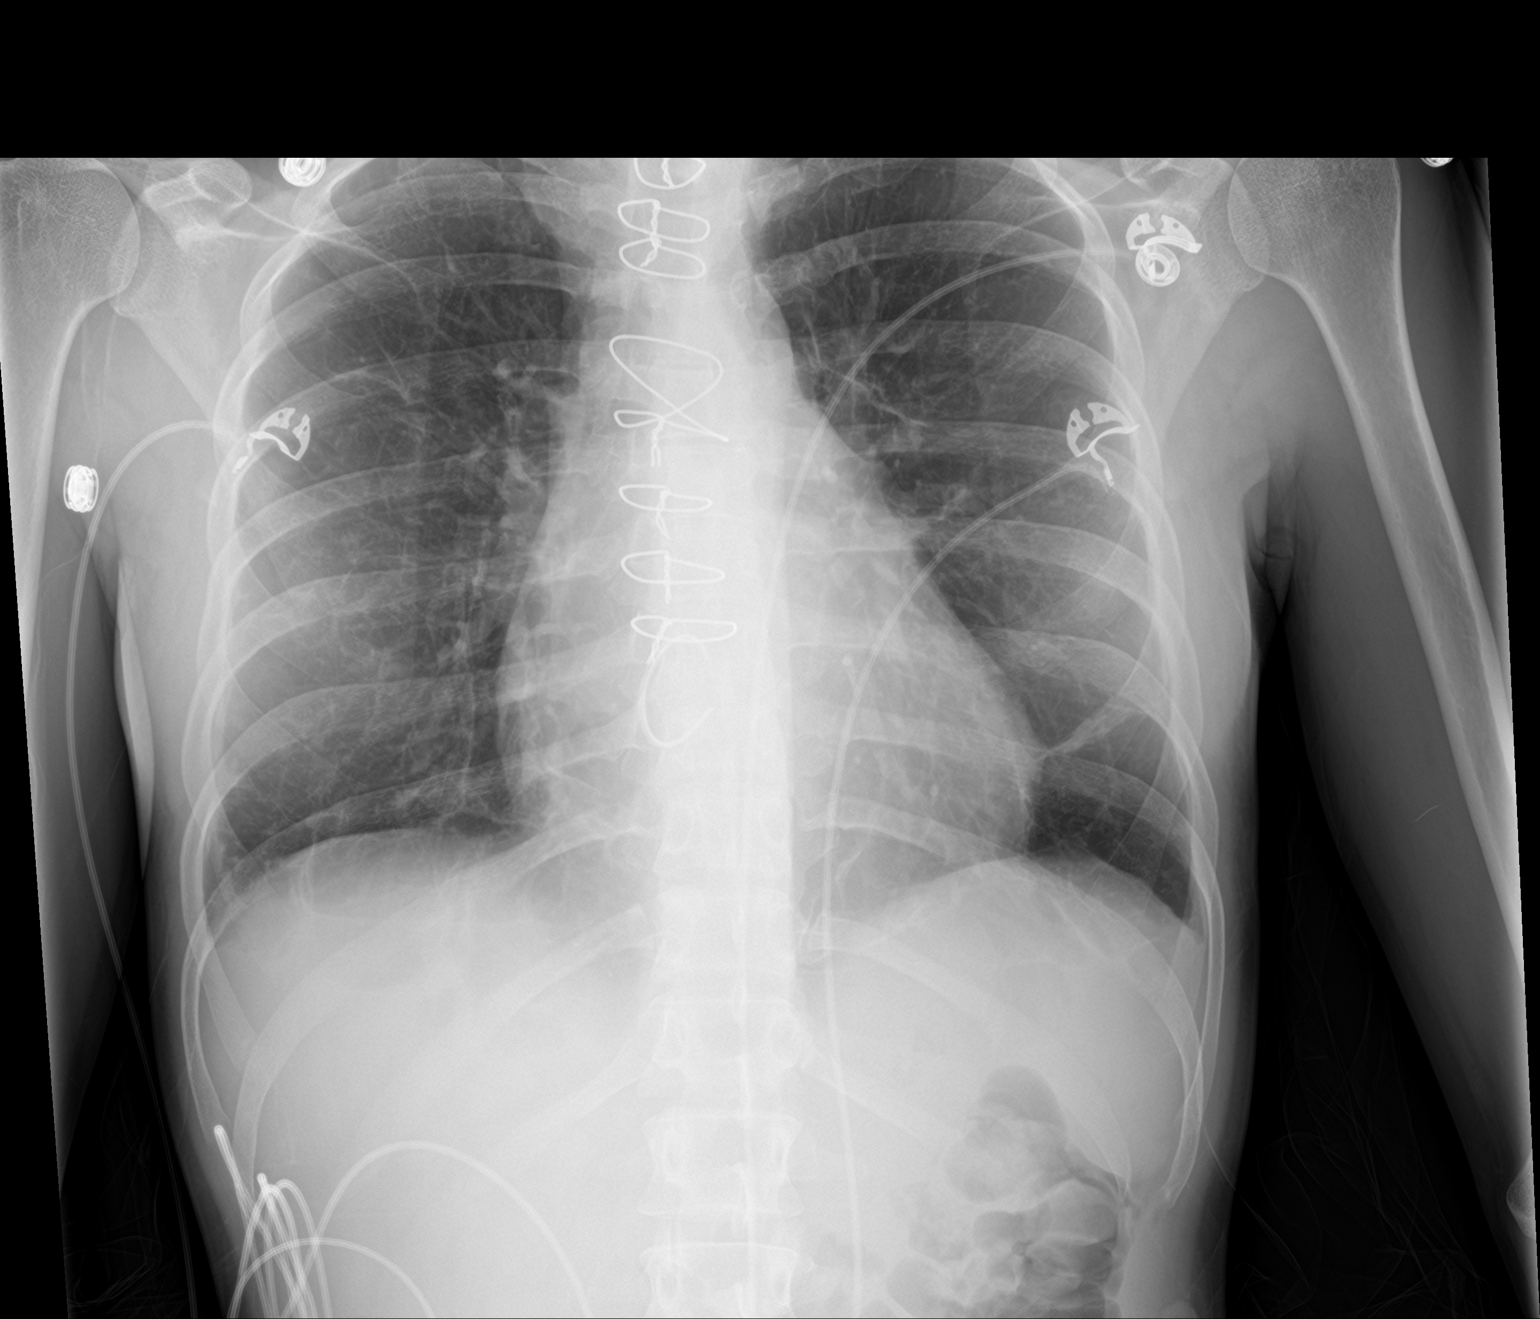

[2 of 2 positions shown; findings below may reference images not displayed]

FINDINGS: Improved aeration of the lungs. Mild linear bibasilar opacities,
likely atelectasis. No definite pleural effusions or pneumothorax.
Cardiac valve repair. Median sternotomy. Cardiomediastinal
silhouette is within normal limits.
IMPRESSION: Improved aeration of the lungs with mild linear bibasilar opacities,
probably subsegmental atelectasis.

## 2021-01-21 MED ORDER — ACETAMINOPHEN 500 MG PO TABS: 1000.0000 mg | ORAL_TABLET | Freq: Four times a day (QID) | ORAL | 0 refills | Status: AC | PRN

## 2021-01-21 MED ORDER — LIDOCAINE 5 % EX PTCH: 2.0000 | MEDICATED_PATCH | CUTANEOUS | 0 refills | Status: DC

## 2021-01-21 MED ORDER — OXYCODONE HCL 5 MG PO TABS: 5.0000 mg | ORAL_TABLET | ORAL | 0 refills | Status: DC | PRN

## 2021-01-21 MED ORDER — GABAPENTIN 300 MG PO CAPS: 300.0000 mg | ORAL_CAPSULE | Freq: Three times a day (TID) | ORAL | 3 refills | Status: DC

## 2021-01-21 MED ORDER — ASPIRIN 325 MG PO TBEC: 325.0000 mg | DELAYED_RELEASE_TABLET | Freq: Every day | ORAL | 0 refills | Status: DC

## 2021-01-21 NOTE — Discharge Instructions (Signed)

## 2021-01-21 NOTE — Progress Notes (Signed)
Pt discharged from unit. Medication/discharge instruction given  Wayland Baik K Nasrin Lanzo, RN  

## 2021-01-21 NOTE — Progress Notes (Signed)
° °   °  [address]       [place] [address]             [phone]      4 Days Post-Op Procedure(s) (LRB): TRICUSPID VALVE REPAIR USING EDWARDS MC3 28 TRICUSPID RING (N/A) TRANSESOPHAGEAL ECHOCARDIOGRAM (TEE) (N/A) APPLICATION OF CELL SAVER  Subjective:  Pain control is better.  She feels up to going home today.  Objective: Vital signs in last 24 hours: Temp:  [97.9 F (36.6 C)-98.5 F (36.9 C)] 98.5 F (36.9 C) (01/09 0505) Pulse Rate:  [87-102] 93 (01/08 2327) Cardiac Rhythm: Normal sinus rhythm (01/08 1940) Resp:  [16-20] 16 (01/09 0505) BP: (97-111)/(63-90) 97/63 (01/09 0505) SpO2:  [95 %-100 %] 95 % (01/09 0505) Weight:  [45 kg] 45 kg (01/08 2327)  Intake/Output from previous day: 01/08 0701 - 01/09 0700 In: 960 [P.O.:960] Out: -   General appearance: alert, cooperative, and no distress Heart: regular rate and rhythm Lungs: clear to auscultation bilaterally Abdomen: soft, non-tender; bowel sounds normal; no masses,  no organomegaly Extremities: extremities normal, atraumatic, no cyanosis or edema Wound: clean and dry  Lab Results: Recent Labs    01/18/21 1713 01/20/21 0137  WBC 6.6 8.1  HGB 8.7* 9.8*  HCT 27.4* 29.9*  PLT PLATELET CLUMPS NOTED ON SMEAR, UNABLE TO ESTIMATE 108*   BMET:  Recent Labs    01/18/21 1713 01/20/21 0137  NA 134* 136  K 3.6 3.2*  CL 105 102  CO2 26 27  GLUCOSE 103* 106*  BUN 7 11  CREATININE 0.60 0.70  CALCIUM 8.0* 8.9    PT/INR: No results for input(s): LABPROT, INR in the last 72 hours. ABG    Component Value Date/Time   PHART 7.390 01/18/2021 0114   HCO3 16.9 (L) 01/18/2021 0114   TCO2 18 (L) 01/18/2021 0114   ACIDBASEDEF 7.0 (H) 01/18/2021 0114   O2SAT 100.0 01/18/2021 0114   CBG (last 3)  Recent Labs    01/19/21 0750 01/19/21 1113 01/19/21 1550  GLUCAP 130* 98 115*    Assessment/Plan: S/P Procedure(s) (LRB): TRICUSPID VALVE REPAIR USING EDWARDS MC3 28 TRICUSPID RING  (N/A) TRANSESOPHAGEAL ECHOCARDIOGRAM (TEE) (N/A) APPLICATION OF CELL SAVER  CV- NSR, BP labile- no BB at this time Pulm- no acute issues, off oxygen, continue IS... CXR is free from pneumothorax, pleural effusions Renal- creatinine has been WNL, no lasix indicated at this time Pain control- improved with Gabapentin, Lioderm, Robaxin, and Oxycodone Dispo- patient stable, will d/c home today    LOS: 4 days    Lowella Dandy, PA-C 01/21/2021

## 2021-01-21 NOTE — Plan of Care (Signed)

## 2021-01-21 NOTE — Progress Notes (Signed)
Patient given discharge instructions and stated understanding.  Patient is waiting on family member to bring her clothes.

## 2021-01-21 NOTE — Anesthesia Postprocedure Evaluation (Signed)
Anesthesia Post Note  Patient: Amber [name]  Procedure(s) Performed: TRICUSPID VALVE REPAIR USING EDWARDS MC3 28 TRICUSPID RING TRANSESOPHAGEAL ECHOCARDIOGRAM (TEE) APPLICATION OF CELL SAVER     Patient location during evaluation: SICU Anesthesia Type: General Level of consciousness: sedated Pain management: pain level controlled Vital Signs Assessment: post-procedure vital signs reviewed and stable Respiratory status: patient remains intubated per anesthesia plan Cardiovascular status: stable Postop Assessment: no apparent nausea or vomiting Anesthetic complications: no   No notable events documented.  Last Vitals:  Vitals:   01/21/21 0505 01/21/21 0816  BP: 97/63 (!) 103/55  Pulse:  83  Resp: 16 20  Temp: 36.9 C 36.9 C  SpO2: 95% 99%    Last Pain:  Vitals:   01/21/21 0816  TempSrc: Oral  PainSc:                  Khalen Styer

## 2021-01-21 NOTE — Progress Notes (Signed)
CARDIAC REHAB PHASE I   Offered to walk with pt. Pt declines at this time, not feeling well. D/c education completed with pt. Pt educated on importance of site care and monitoring incision daily. Encouraged continued IS use, walks, nutrition, and sternal precautions. Pt given in-the-tube sheet. Reviewed restrictions and exercise guidelines. Will refer to CRP II [place].  [id number] Rufina Falco, RN BSN 01/21/2021 8:39 AM

## 2021-01-21 NOTE — Discharge Summary (Signed)
[address]       [place] [address]             [phone]    Physician Discharge Summary  Patient ID: Amber [name] MRN: [id number] DOB/AGE: 1990/10/19 31 y.o.  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, sequela    Chronic hepatitis C without hepatic coma ([place]) 09/26/2020   Medication monitoring encounter 09/26/2020   Immunization counseling 01/26/2020   IV infiltrate, subsequent encounter 01/16/2020   Septic pulmonary embolism without acute cor pulmonale ([place]) 01/16/2020   Hydropneumothorax 01/16/2020   Severe tricuspid regurgitation 01/16/2020   GAD (generalized anxiety disorder) 01/16/2020   Hepatitis C, chronic ([place]) 01/16/2020   Anemia, chronic disease 01/16/2020   Protein-calorie malnutrition, severe 12/27/2019   MRSA infection 12/06/2019   Opioid use with withdrawal ([place])    Endocarditis of tricuspid valve    Palliative care encounter    Septic shock ([place]) 11/30/2019   Septic embolism ([place]) 11/05/2019   AKI (acute kidney injury) ([place]) 11/05/2019   Hyponatremia 11/05/2019   Cellulitis of left foot 11/05/2019   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   Bacterial endocarditis 11/05/2019   Acute bacterial endocarditis    MRSA bacteremia    Sepsis ([place]) 11/03/2019   Abscess of face 08/20/2018   Facial cellulitis 11/21/2017   Depression    IV drug abuse ([place])    Suicidal ideation    Discharge Diagnoses:  Patient Active Problem List   Diagnosis Date Noted   S/P tricuspid valve repair 01/17/2021   Osteomyelitis ([place]) 10/10/2020   Smoking 10/10/2020   IV drug user 10/10/2020   Abscess of right leg 09/28/2020   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, sequela    Chronic hepatitis C without hepatic coma ([place]) 09/26/2020    Medication monitoring encounter 09/26/2020   Immunization counseling 01/26/2020   IV infiltrate, subsequent encounter 01/16/2020   Septic pulmonary embolism without acute cor pulmonale ([place]) 01/16/2020   Hydropneumothorax 01/16/2020   Severe tricuspid regurgitation 01/16/2020   GAD (generalized anxiety disorder) 01/16/2020   Hepatitis C, chronic ([place]) 01/16/2020   Anemia, chronic disease 01/16/2020   Protein-calorie malnutrition, severe 12/27/2019   MRSA infection 12/06/2019   Opioid use with withdrawal ([place])    Endocarditis of tricuspid valve    Palliative care encounter    Septic shock ([place]) 11/30/2019   Septic embolism ([place]) 11/05/2019   AKI (acute kidney injury) ([place]) 11/05/2019   Hyponatremia 11/05/2019   Cellulitis of left foot 11/05/2019   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   Bacterial endocarditis 11/05/2019   Acute bacterial endocarditis    MRSA bacteremia    Sepsis ([place]) 11/03/2019   Abscess of face 08/20/2018   Facial cellulitis 11/21/2017   Depression    IV drug abuse ([place])    Suicidal ideation    Discharged Condition: good  History of Present Illness:  Amber [name] is a 31 yo female with 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  She is well known to [facility] having undergone an Angio vac debridement of her Tricuspid Valve in November of 2021.  She recently suffered an infection in a previously placed tibial plate.  This ultimately had to be removed and she had a metal rod  placed.  This has been healing without signs of infection.  She has been followed by Dr. Kipp Brood and she admits to being short of breath with minimal activity.  Follow up Echocardiogram performed in November of 2022 continued to show severe Tricuspid valve regurgitation with systolic reversal of flow within her hepatic veins.  There is also evidence of right ventricular overload.  Her heart function remains preserved.  It was felt patient would benefit from surgical  intervention on her Tricuspid Valve.  The plan will be to repair her native valve if possible.  If unsuccessful tricuspid valve replacement would be indicated.  The risks and benefits of the procedure were explained to the patient and she was agreeable to proceed.   Hospital Course:  Amber [name] presents to [facility] on [facility]17/2021.  She was taken to the operating room and underwent Tricuspid valve repair with autologous pericardium, reconstruction of the anterior and septal leaflet, placement of 4 neo-chords, insertion of MC3 40mm annuloplasty ring.  Transesophageal echocardiogram obtained in the operating room showed good function of the repair tricuspid valve.  The patient separated from cardiopulmonary bypass without difficulty.  Hemostasis was obtained.  She was transfused with 1 platelet pheresis immediately postop.  She was transferred to the surgical ICU in stable condition.  She was weaned from the ventilator and extubated without difficulty.  Hemodynamics remained stable.  Monitoring lines were removed on the first postoperative day.  Clear liquid diet was initiated. EPW were removed on 01/07 and she was felt surgically stable for transfer from the ICU to 4E for further convalescence. Pain control remained her biggest issue. As of 01/08, she is on Oxy PRN, Lidocaine patch, Gabapentin 300 mg tid and at hs, and Robaxin tid.  Her pain control was much improved.  She remained in NSR with low blood pressure and BB was not started.  Her surgical incisions are healing w/o evidence of infection.  She is medically stable for discharge home today.  Consults: None  Significant Diagnostic Studies: cardiac graphics: Echocardiogram:   IMPRESSIONS     1. Left ventricular ejection fraction, by estimation, is 55 to 60%. The  left ventricle has normal function. The left ventricle has no regional  wall motion abnormalities. Left ventricular diastolic parameters were  normal.   2. D-shaped  interventricular septum in diastole, suggesting RV volume  overload. Right ventricular systolic function is normal. The right  ventricular size is moderately enlarged. There is normal pulmonary artery  systolic pressure. The estimated right  ventricular systolic pressure is 123456 mmHg.   3. Right atrial size was mildly dilated.   4. The mitral valve is normal in structure. No evidence of mitral valve  regurgitation. No evidence of mitral stenosis.   5. The tricuspid valve is abnormal. Tricuspid valve regurgitation is  severe. Systolic flow reversal in the hepatic vein doppler pattern.   6. The aortic valve is tricuspid. Aortic valve regurgitation is not  visualized. No aortic stenosis is present.   7. The inferior vena cava is dilated in size with >50% respiratory  variability, suggesting right atrial pressure of 8 mmHg.   Treatments: surgery:     01/17/2021 Patient:  Alvera Singh Pre-Op Dx: Severe tricuspid valve regurgitation Post-op Dx: Same Procedure: Tricuspid valve repair with autologous pericardium Reconstruction of the anterior and septal leaflet Placement of 4 neo-chords Insertion of MC3 44mm annuloplasty ring     Surgeon and Role:      * Lajuana Matte, MD - Primary    *  Dr. Prescott Gum, MD - Assisting Surgeon   Discharge Exam: Blood pressure 97/63, pulse 93, temperature 98.5 F (36.9 C), temperature source Oral, resp. rate 16, height 5\' 1"  (1.549 m), weight 45 kg, SpO2 95 %.  General appearance: alert, cooperative, and no distress Heart: regular rate and rhythm Lungs: clear to auscultation bilaterally Abdomen: soft, non-tender; bowel sounds normal; no masses,  no organomegaly Extremities: extremities normal, atraumatic, no cyanosis or edema Wound: clean and dry  Discharge Medications:  The patient has been discharged on:   1.Beta Blocker:  Yes [   ]                              No   [ x  ]                              If No, reason:  hypotension  2.Ace Inhibitor/ARB: Yes [   ]                                     No  [ x   ]                                     If No, reason: hypotension  3.Statin:   Yes [   ]                  No  [  x ]                  If No, reason: No CAD, No HLD  4.Ecasa:  Yes  [  X ]                  No   [   ]                  If No, reason:  Patient had ACS upon admission:  Plavix/P2Y12 inhibitor: Yes [   ]                                      No  [  x]   Allergies as of 01/21/2021       Reactions   Bee Venom Anaphylaxis   Other Anaphylaxis   Bee sting        Medication List     STOP taking these medications    buprenorphine-naloxone 2-0.5 mg Subl SL tablet Commonly known as: SUBOXONE   torsemide 10 MG tablet Commonly known as: DEMADEX       TAKE these medications    acetaminophen 500 MG tablet Commonly known as: TYLENOL Take 2 tablets (1,000 mg total) by mouth every 6 (six) hours as needed for headache or mild pain. What changed: reasons to take this   amoxicillin 500 MG capsule Commonly known as: AMOXIL Take 500 mg by mouth every 6 (six) hours.   aspirin 325 MG EC tablet Take 1 tablet (325 mg total) by mouth daily.   escitalopram 20 MG tablet Commonly known as: LEXAPRO Take 1 tablet (20 mg total) by mouth at bedtime.   gabapentin 600 MG tablet Commonly known as:  NEURONTIN Take 600 mg by mouth at bedtime. What changed: Another medication with the same name was added. Make sure you understand how and when to take each.   gabapentin 300 MG capsule Commonly known as: NEURONTIN Take 1 capsule (300 mg total) by mouth 3 (three) times daily. What changed: You were already taking a medication with the same name, and this prescription was added. Make sure you understand how and when to take each.   ibuprofen 600 MG tablet Commonly known as: ADVIL Take 1 tablet (600 mg total) by mouth every 6 (six) hours as needed.   lidocaine 5 % Commonly known as:  LIDODERM Place 2 patches onto the skin daily. Remove & Discard patch within 12 hours or as directed by MD   multivitamin with minerals Tabs tablet Take 1 tablet by mouth daily.   oxyCODONE 5 MG immediate release tablet Commonly known as: Oxy IR/ROXICODONE Take 1-2 tablets (5-10 mg total) by mouth every 4 (four) hours as needed for severe pain.        SignedEllwood Handler, PA-C 01/21/2021, 7:42 AM

## 2021-01-22 LAB — AEROBIC/ANAEROBIC CULTURE W GRAM STAIN (SURGICAL/DEEP WOUND)
Culture: NO GROWTH
Gram Stain: NONE SEEN

## 2021-01-22 MED FILL — Electrolyte-R (PH 7.4) Solution: INTRAVENOUS | Qty: 4000 | Status: AC

## 2021-01-22 MED FILL — Heparin Sodium (Porcine) Inj 1000 Unit/ML: INTRAMUSCULAR | Qty: 20 | Status: AC

## 2021-01-22 MED FILL — Sodium Bicarbonate IV Soln 8.4%: INTRAVENOUS | Qty: 100 | Status: AC

## 2021-01-22 MED FILL — Sodium Chloride IV Soln 0.9%: INTRAVENOUS | Qty: 4000 | Status: AC

## 2021-01-22 MED FILL — Calcium Chloride Inj 10%: INTRAVENOUS | Qty: 10 | Status: AC

## 2021-01-22 MED FILL — Mannitol IV Soln 20%: INTRAVENOUS | Qty: 500 | Status: AC

## 2021-01-23 ENCOUNTER — Other Ambulatory Visit (HOSPITAL_COMMUNITY): Payer: Self-pay | Admitting: *Deleted

## 2021-01-23 DIAGNOSIS — I071 Rheumatic tricuspid insufficiency: Secondary | ICD-10-CM

## 2021-01-28 LAB — BPAM RBC
Blood Product Expiration Date: 202301262359
Blood Product Expiration Date: 202301272359
Blood Product Expiration Date: 202301272359
Blood Product Expiration Date: 202301272359
ISSUE DATE / TIME: 202301051316
ISSUE DATE / TIME: 202301051316
Unit Type and Rh: 6200
Unit Type and Rh: 6200
Unit Type and Rh: 6200
Unit Type and Rh: 6200

## 2021-01-28 LAB — TYPE AND SCREEN
ABO/RH(D): A POS
Antibody Screen: NEGATIVE
Unit division: 0
Unit division: 0
Unit division: 0
Unit division: 0

## 2021-01-29 ENCOUNTER — Telehealth (HOSPITAL_COMMUNITY): Payer: Self-pay

## 2021-01-29 NOTE — Telephone Encounter (Signed)
Per phase I cardiac rehab, fax cardiac rehab referral to [facility]. 

## 2021-01-30 ENCOUNTER — Other Ambulatory Visit (HOSPITAL_COMMUNITY): Payer: Medicaid Other

## 2021-01-30 ENCOUNTER — Telehealth: Payer: Self-pay | Admitting: *Deleted

## 2021-01-30 NOTE — Telephone Encounter (Signed)
Patient's mother, Altamese Cabal, contacted the office requesting a refill of oxycodone for the patient. Per mother, patient has been taking one 5mg  oxycodone every 4 hours to manage her pain. She is also taking Tylenol, Gabapentin, and using lidocaine patches for management. Patient reports being out for the last day but states activity level has decreased r/t pain. Per , PA, refill will be sent to patient's preferred pharmacy. Merry made aware.

## 2021-01-31 ENCOUNTER — Other Ambulatory Visit: Payer: Self-pay | Admitting: Physician Assistant

## 2021-01-31 MED ORDER — OXYCODONE HCL 5 MG PO TABS: 5.0000 mg | ORAL_TABLET | Freq: Four times a day (QID) | ORAL | 0 refills | Status: AC | PRN

## 2021-02-01 ENCOUNTER — Ambulatory Visit (INDEPENDENT_AMBULATORY_CARE_PROVIDER_SITE_OTHER): Payer: Self-pay | Admitting: Thoracic Surgery (Cardiothoracic Vascular Surgery)

## 2021-02-01 ENCOUNTER — Other Ambulatory Visit: Payer: Self-pay

## 2021-02-01 DIAGNOSIS — I361 Nonrheumatic tricuspid (valve) insufficiency: Secondary | ICD-10-CM

## 2021-02-01 NOTE — Progress Notes (Signed)
°   °  [address]       [name] [address]             [phone]       Patient: Home Provider: Office Consent for Telemedicine visit obtained.  Todays visit was completed via a real-time telehealth (see specific modality noted below). The patient/authorized person provided oral consent at the time of the visit to engage in a telemedicine encounter with the present provider at [facility]. The patient/authorized person was informed of the potential benefits, limitations, and risks of telemedicine. The patient/authorized person expressed understanding that the laws that protect confidentiality also apply to telemedicine. The patient/authorized person acknowledged understanding that telemedicine does not provide emergency services and that he or she would need to call 911 or proceed to the nearest hospital for help if such a need arose.   Total time spent in the clinical discussion 10 minutes.  Telehealth Modality: Phone visit (audio only)  I had a telephone visit with  Amber [name] who is s/p tricuspid valve repair.  Overall doing well.  Pain is minimal.  Ambulating well. Amber [name] will see [name] back in 1 month with a chest x-ray for cardiac rehab clearance.  She is also scheduled to undergo an echocardiogram on March 01, 2021.  We will review this at her appointment as well.  Lexys Milliner [name]

## 2021-02-18 LAB — FUNGUS CULTURE WITH STAIN

## 2021-02-18 LAB — FUNGUS CULTURE RESULT

## 2021-02-18 LAB — FUNGAL ORGANISM REFLEX

## 2021-02-26 ENCOUNTER — Encounter (HOSPITAL_COMMUNITY): Payer: Medicaid Other

## 2021-02-27 LAB — ECHO INTRAOPERATIVE TEE
Height: 61 in
Weight: 1696 oz

## 2021-03-01 ENCOUNTER — Other Ambulatory Visit: Payer: Self-pay

## 2021-03-01 ENCOUNTER — Ambulatory Visit (HOSPITAL_COMMUNITY)
Admission: RE | Admit: 2021-03-01 | Discharge: 2021-03-01 | Disposition: A | Discharge status: Home / Self Care | Payer: Self-pay | Source: Ambulatory Visit | Attending: Cardiology | Admitting: Cardiology

## 2021-03-01 ENCOUNTER — Encounter (HOSPITAL_COMMUNITY): Payer: Self-pay

## 2021-03-01 ENCOUNTER — Ambulatory Visit (HOSPITAL_BASED_OUTPATIENT_CLINIC_OR_DEPARTMENT_OTHER)
Admission: RE | Admit: 2021-03-01 | Discharge: 2021-03-01 | Disposition: A | Discharge status: Home / Self Care | Payer: Self-pay | Source: Ambulatory Visit | Attending: Cardiology | Admitting: Cardiology

## 2021-03-01 VITALS — BP 102/64 | HR 73 | Wt 107.0 lb

## 2021-03-01 DIAGNOSIS — I071 Rheumatic tricuspid insufficiency: Secondary | ICD-10-CM

## 2021-03-01 DIAGNOSIS — I3481 Nonrheumatic mitral (valve) annulus calcification: Secondary | ICD-10-CM | POA: Insufficient documentation

## 2021-03-01 DIAGNOSIS — Z954 Presence of other heart-valve replacement: Secondary | ICD-10-CM | POA: Insufficient documentation

## 2021-03-01 DIAGNOSIS — E876 Hypokalemia: Secondary | ICD-10-CM

## 2021-03-01 DIAGNOSIS — L02415 Cutaneous abscess of right lower limb: Secondary | ICD-10-CM

## 2021-03-01 DIAGNOSIS — F1990 Other psychoactive substance use, unspecified, uncomplicated: Secondary | ICD-10-CM

## 2021-03-01 DIAGNOSIS — I361 Nonrheumatic tricuspid (valve) insufficiency: Secondary | ICD-10-CM

## 2021-03-01 DIAGNOSIS — I079 Rheumatic tricuspid valve disease, unspecified: Secondary | ICD-10-CM

## 2021-03-01 DIAGNOSIS — I5022 Chronic systolic (congestive) heart failure: Secondary | ICD-10-CM

## 2021-03-01 DIAGNOSIS — I2690 Septic pulmonary embolism without acute cor pulmonale: Secondary | ICD-10-CM

## 2021-03-01 LAB — BASIC METABOLIC PANEL
Anion gap: 10 (ref 5–15)
BUN: 14 mg/dL (ref 6–20)
CO2: 25 mmol/L (ref 22–32)
Calcium: 9.7 mg/dL (ref 8.9–10.3)
Chloride: 106 mmol/L (ref 98–111)
Creatinine, Ser: 0.77 mg/dL (ref 0.44–1.00)
GFR, Estimated: >60 mL/min (ref >60)
Glucose, Bld: 96 mg/dL (ref 70–99)
Potassium: 3.7 mmol/L (ref 3.5–5.1)
Sodium: 141 mmol/L (ref 135–145)

## 2021-03-01 LAB — ECHOCARDIOGRAM COMPLETE
Area-P 1/2: 3.81 cm2
Calc EF: 66.4 %
S' Lateral: 2.7 cm
Single Plane A2C EF: 71.1 %
Single Plane A4C EF: 60.6 %

## 2021-03-01 MED ORDER — ASPIRIN 325 MG PO TBEC: 325.0000 mg | DELAYED_RELEASE_TABLET | Freq: Every day | ORAL | 11 refills | Status: DC

## 2021-03-01 NOTE — Progress Notes (Signed)
°  Echocardiogram 2D Echocardiogram has been performed.  Amber [name] 03/01/2021, 8:50 AM
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
## 2021-03-01 NOTE — Patient Instructions (Signed)
RESTART Aspirin 325 mg, one tab daily  Labs today We will only contact you if something comes back abnormal or we need to make some changes. Otherwise no news is good news!   Your physician recommends that you schedule a follow-up appointment in: 3-4 months with Dr Aundra Dubin  Do the following things EVERYDAY: Weigh yourself in the morning before breakfast. Write it down and keep it in a log. Take your medicines as prescribed Eat low salt foods--Limit salt (sodium) to 2000 mg per day.  Stay as active as you can everyday Limit all fluids for the day to less than 2 liters  At the [facility], you and your health needs are our priority. As part of our continuing mission to provide you with exceptional heart care, we have created designated Provider Care Teams. These Care Teams include your primary Cardiologist (physician) and Advanced Practice Providers (APPs- Physician Assistants and Nurse Practitioners) who all work together to provide you with the care you need, when you need it.   You may see any of the following providers on your designated Care Team at your next follow up: Dr Glori Bickers Dr Haynes Kerns, NP Lyda Jester, [place] [facility] [place], [place] Audry Riles, PharmD   Please be sure to bring in all your medications bottles to every appointment.   If you have any questions or concerns before your next appointment please send [name] a message through [facility] or call our office at [phone].    TO LEAVE A MESSAGE FOR THE NURSE SELECT OPTION 2, PLEASE LEAVE A MESSAGE INCLUDING: YOUR NAME DATE OF BIRTH CALL BACK NUMBER REASON FOR CALL**this is important as we prioritize the call backs  YOU WILL RECEIVE A CALL BACK THE SAME DAY AS LONG AS YOU CALL BEFORE 4:00 PM

## 2021-03-02 LAB — ACID FAST CULTURE WITH REFLEXED SENSITIVITIES (MYCOBACTERIA): Acid Fast Culture: NEGATIVE

## 2021-03-07 ENCOUNTER — Other Ambulatory Visit: Payer: Self-pay | Admitting: Thoracic Surgery (Cardiothoracic Vascular Surgery)

## 2021-03-07 DIAGNOSIS — Z9889 Other specified postprocedural states: Secondary | ICD-10-CM

## 2021-03-08 ENCOUNTER — Ambulatory Visit
Admission: RE | Admit: 2021-03-08 | Discharge: 2021-03-08 | Disposition: A | Discharge status: Home / Self Care | Payer: Self-pay | Source: Ambulatory Visit | Attending: Thoracic Surgery (Cardiothoracic Vascular Surgery) | Admitting: Thoracic Surgery (Cardiothoracic Vascular Surgery)

## 2021-03-08 ENCOUNTER — Other Ambulatory Visit: Payer: Self-pay

## 2021-03-08 ENCOUNTER — Ambulatory Visit (INDEPENDENT_AMBULATORY_CARE_PROVIDER_SITE_OTHER): Payer: Self-pay | Admitting: Thoracic Surgery (Cardiothoracic Vascular Surgery)

## 2021-03-08 VITALS — BP 115/73 | HR 62 | Resp 20 | Ht 61.0 in | Wt 105.0 lb

## 2021-03-08 DIAGNOSIS — I361 Nonrheumatic tricuspid (valve) insufficiency: Secondary | ICD-10-CM

## 2021-03-08 DIAGNOSIS — Z9889 Other specified postprocedural states: Secondary | ICD-10-CM

## 2021-03-08 DIAGNOSIS — I079 Rheumatic tricuspid valve disease, unspecified: Secondary | ICD-10-CM

## 2021-03-08 IMAGING — DX DG CHEST 2V
2 series · 2 of 2 positions shown · non-contrast
Comparison: [DATE]

CLINICAL DATA: Status post tricuspid valve repair

EXAM:
CHEST - 2 VIEW

[dg chest 2 view (1 of 2)]
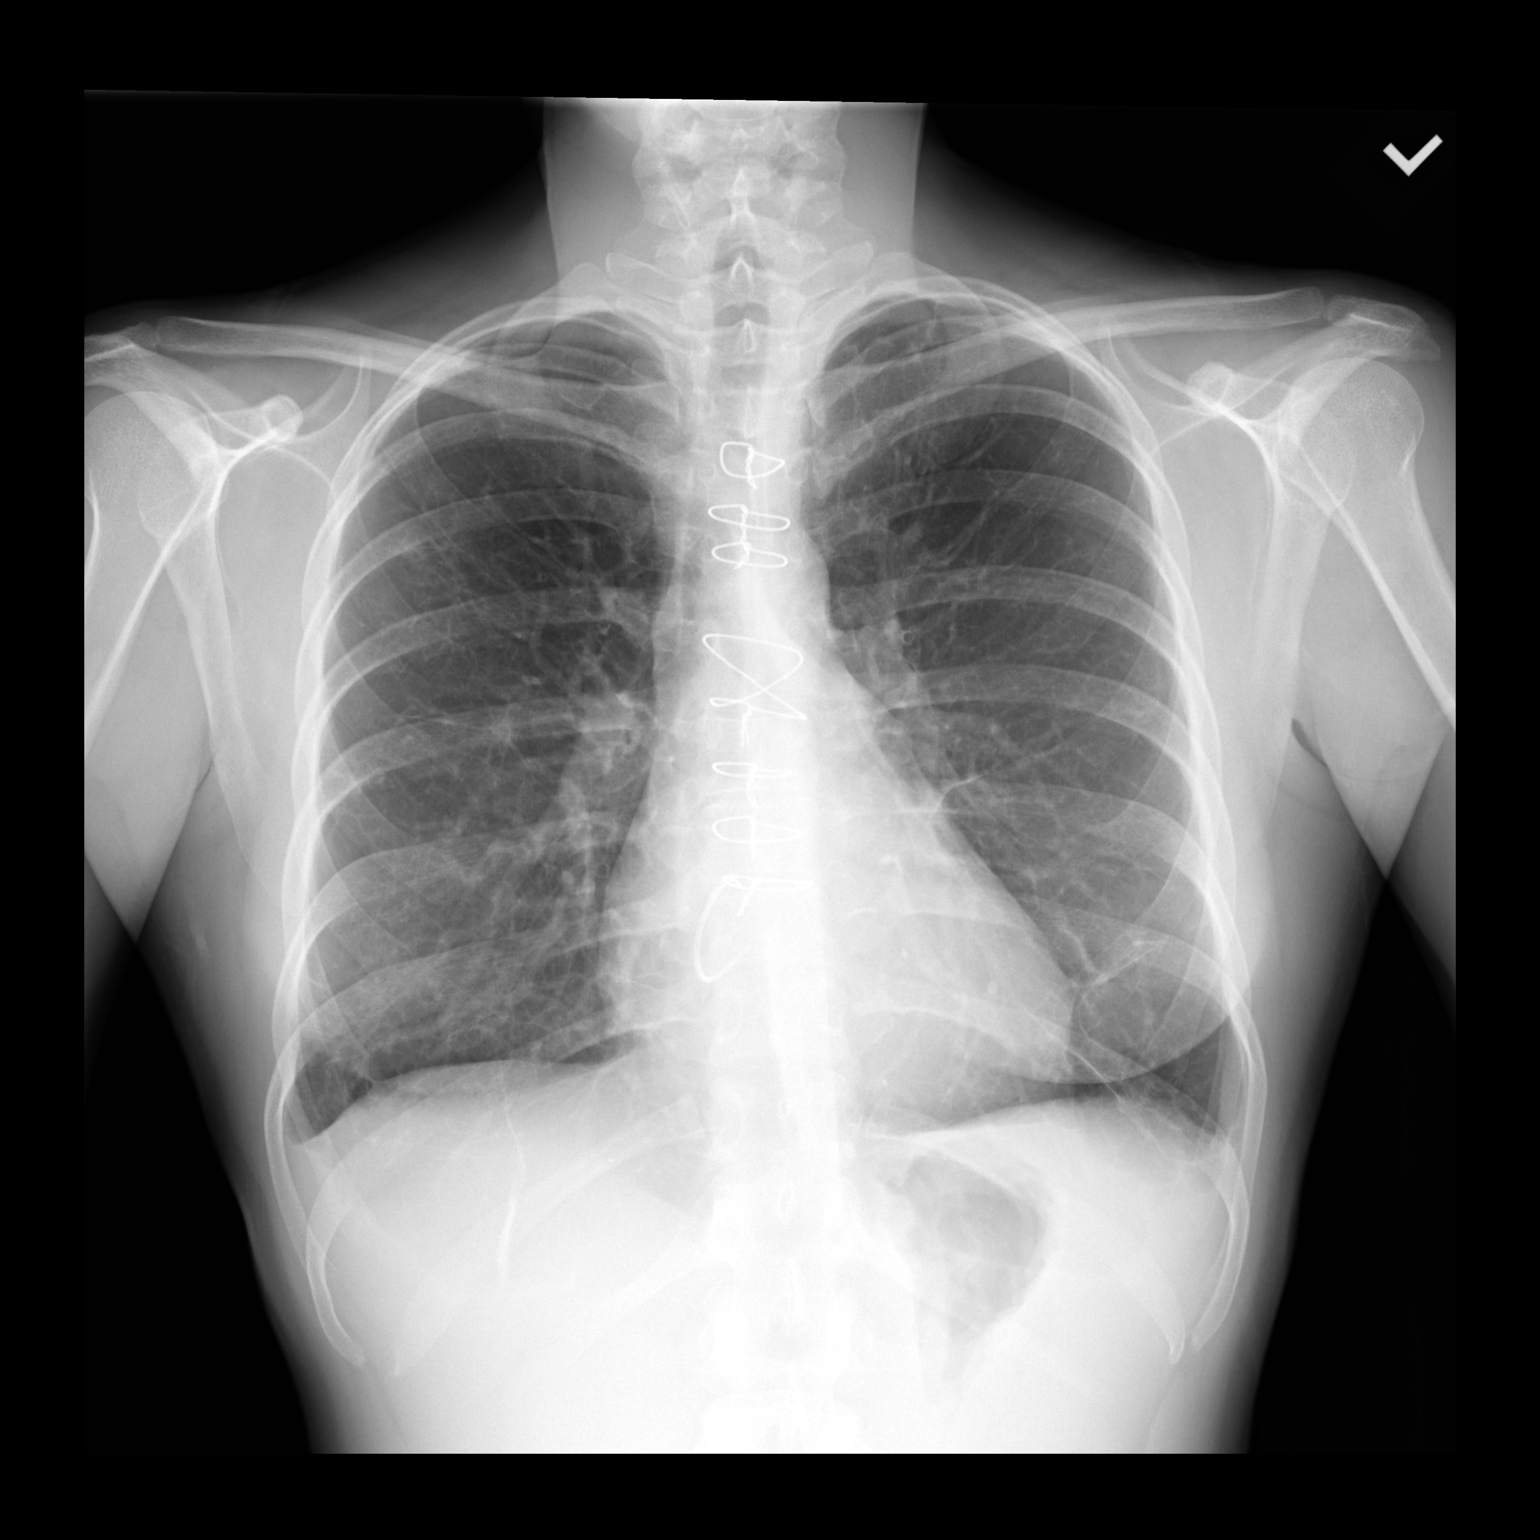

[dg chest 2 view (2 of 2)]
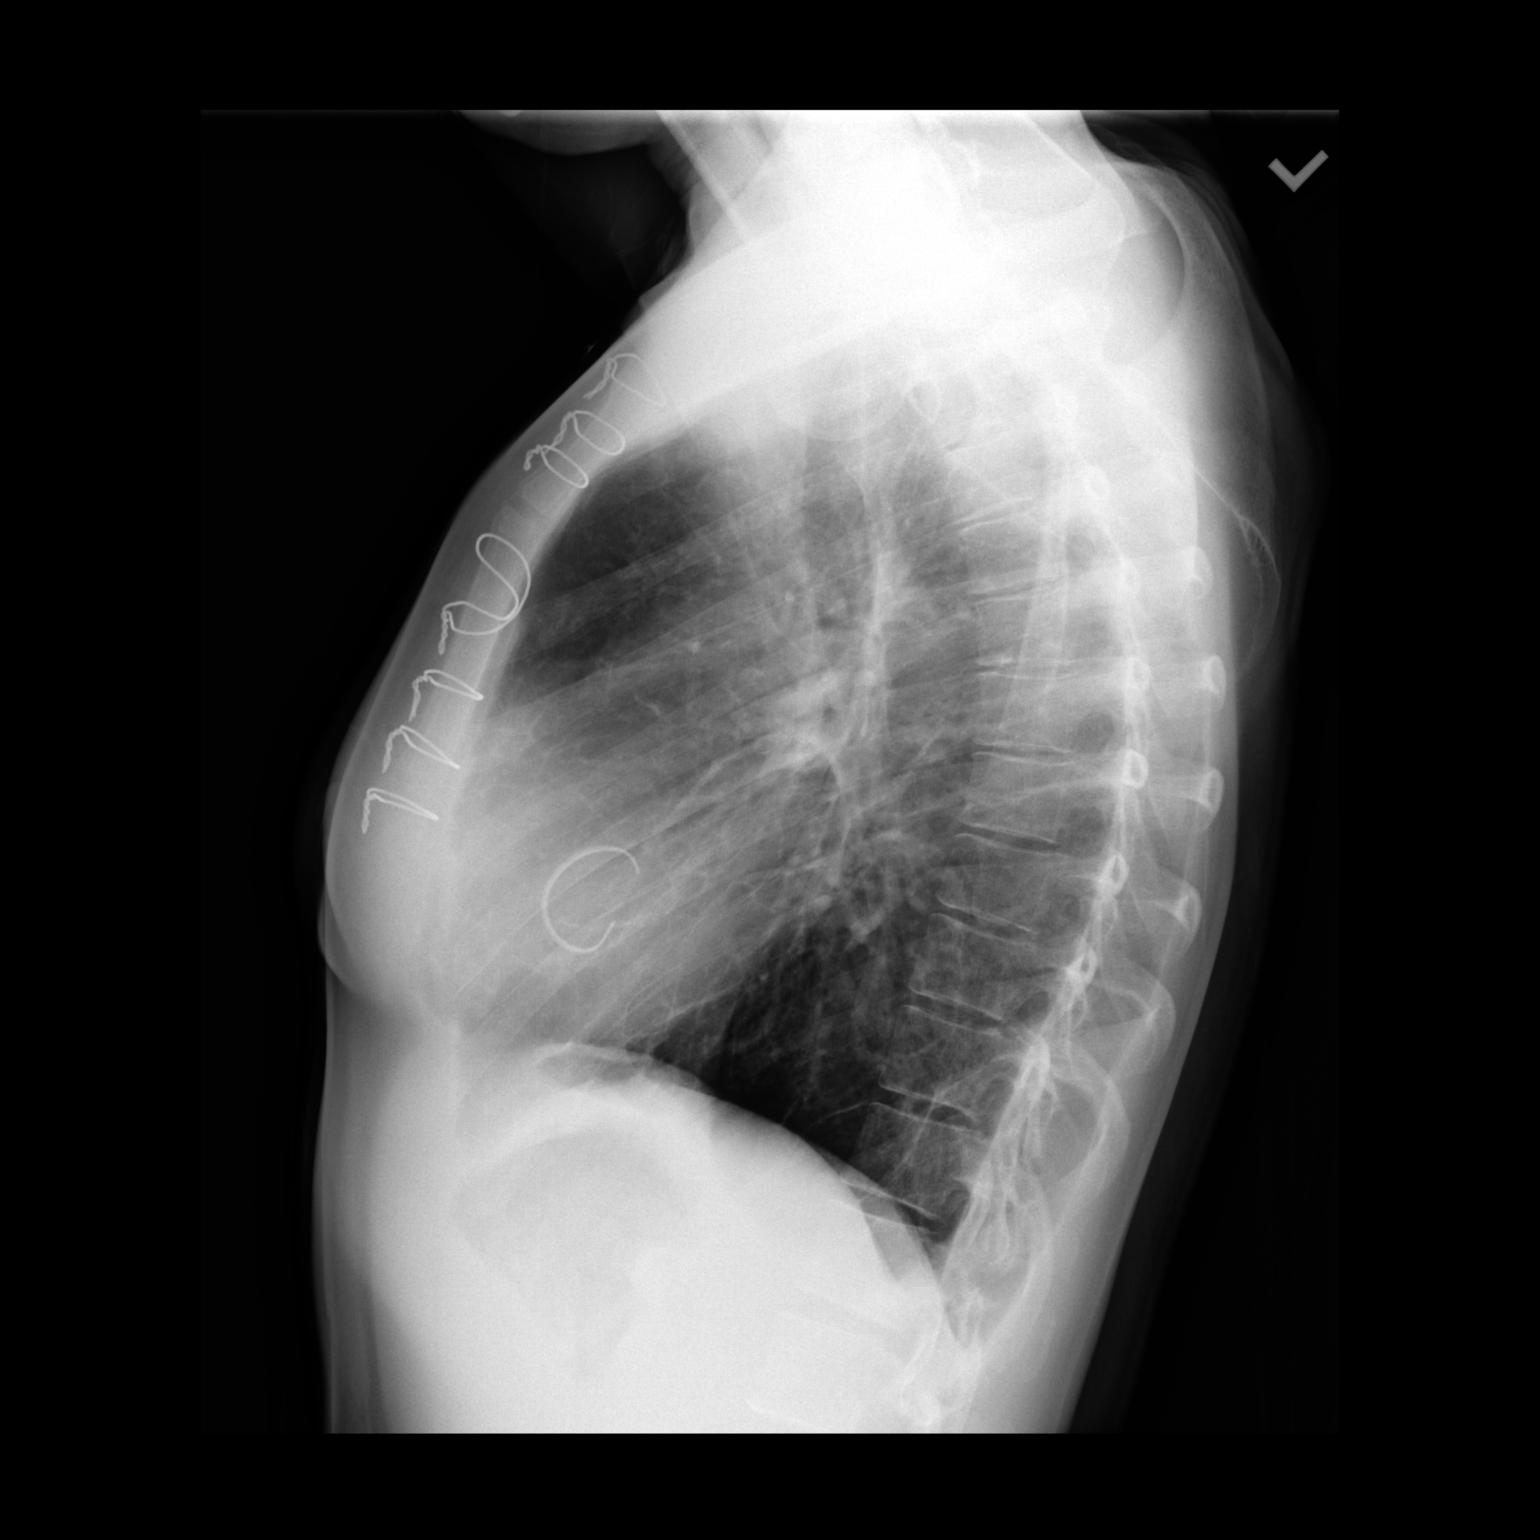

[2 of 2 positions shown; findings below may reference images not displayed]

FINDINGS: No focal consolidation. No pleural effusion or pneumothorax. Heart
and mediastinal contours are unremarkable. Prior median sternotomy.
Tricuspid valve repair.

No acute osseous abnormality.
IMPRESSION: No active cardiopulmonary disease.

## 2021-03-08 NOTE — Progress Notes (Signed)
° °   °  [address]       [place] [address]             [phone]        LAMERLE JABS [facility] Medical Record #[id number] Date of Birth: 07-Oct-1990  Referring: Laurey Morale, MD Primary Care: [facility] Primary Cardiologist:Gayatri Wynell Balloon, MD  Reason for visit:   follow-up  History of Present Illness:     Mrs. Minahan comes in for 1 month follow-up appointment.  She is status post tricuspid valve repair with autologous pericardium.  She states that she is doing much better.  She denies any shortness of breath and her exercise tolerance is increased.  Physical Exam: BP 115/73    Pulse 62    Resp 20    Ht 5\' 1"  (1.549 m)    Wt 105 lb (47.6 kg)    SpO2 97% Comment: RA   BMI 19.84 kg/m   Alert NAD Incision clean.  Sternum stable Abdomen, ND No peripheral edema   Diagnostic Studies & Laboratory data: CXR: Clear     Assessment / Plan:   31 year old female status post tricuspid valve repair with autologous pericardium.  She had an echocardiogram earlier this month which showed trivial tricuspid valve regurgitation.  Her right heart function is only mildly reduced.  Overall she is doing well.  She is cleared to resume all normal activities.  She will continue follow-up with heart failure service.   32 03/08/2021 1:46 PM

## 2021-05-29 ENCOUNTER — Other Ambulatory Visit: Payer: Self-pay

## 2021-05-29 ENCOUNTER — Emergency Department (HOSPITAL_COMMUNITY): Payer: Self-pay

## 2021-05-29 ENCOUNTER — Emergency Department (HOSPITAL_COMMUNITY)
Admission: EM | Admit: 2021-05-29 | Discharge: 2021-05-29 | Disposition: A | Discharge status: Home / Self Care | Payer: Self-pay | Attending: Emergency Medicine | Admitting: Emergency Medicine

## 2021-05-29 ENCOUNTER — Encounter (HOSPITAL_COMMUNITY): Payer: Self-pay

## 2021-05-29 DIAGNOSIS — O4690 Antepartum hemorrhage, unspecified, unspecified trimester: Secondary | ICD-10-CM

## 2021-05-29 DIAGNOSIS — Z3A01 Less than 8 weeks gestation of pregnancy: Secondary | ICD-10-CM | POA: Insufficient documentation

## 2021-05-29 DIAGNOSIS — O039 Complete or unspecified spontaneous abortion without complication: Secondary | ICD-10-CM | POA: Insufficient documentation

## 2021-05-29 DIAGNOSIS — Z7982 Long term (current) use of aspirin: Secondary | ICD-10-CM | POA: Insufficient documentation

## 2021-05-29 DIAGNOSIS — N898 Other specified noninflammatory disorders of vagina: Secondary | ICD-10-CM | POA: Insufficient documentation

## 2021-05-29 LAB — URINALYSIS, ROUTINE W REFLEX MICROSCOPIC
Bilirubin Urine: NEGATIVE
Glucose, UA: NEGATIVE mg/dL
Ketones, ur: NEGATIVE mg/dL
Leukocytes,Ua: NEGATIVE
Nitrite: NEGATIVE
Protein, ur: NEGATIVE mg/dL
RBC / HPF: >50 RBC/hpf — ABNORMAL HIGH (ref 0–5)
Specific Gravity, Urine: 1.021 (ref 1.005–1.030)
pH: 6 (ref 5.0–8.0)

## 2021-05-29 LAB — CBC WITH DIFFERENTIAL/PLATELET
Abs Immature Granulocytes: 0.03 10*3/uL (ref 0.00–0.07)
Basophils Absolute: 0 10*3/uL (ref 0.0–0.1)
Basophils Relative: 0 %
Eosinophils Absolute: 0.2 10*3/uL (ref 0.0–0.5)
Eosinophils Relative: 2 %
HCT: 41.5 % (ref 36.0–46.0)
Hemoglobin: 13.4 g/dL (ref 12.0–15.0)
Immature Granulocytes: 0 %
Lymphocytes Relative: 15 %
Lymphs Abs: 1.5 10*3/uL (ref 0.7–4.0)
MCH: 27.5 pg (ref 26.0–34.0)
MCHC: 32.3 g/dL (ref 30.0–36.0)
MCV: 85 fL (ref 80.0–100.0)
Monocytes Absolute: 0.6 10*3/uL (ref 0.1–1.0)
Monocytes Relative: 6 %
Neutro Abs: 7.7 10*3/uL (ref 1.7–7.7)
Neutrophils Relative %: 77 %
Platelets: 206 10*3/uL (ref 150–400)
RBC: 4.88 MIL/uL (ref 3.87–5.11)
RDW: 15 % (ref 11.5–15.5)
WBC: 10.1 10*3/uL (ref 4.0–10.5)
nRBC: 0 % (ref 0.0–0.2)

## 2021-05-29 LAB — BASIC METABOLIC PANEL WITH GFR
Anion gap: 10 (ref 5–15)
BUN: 10 mg/dL (ref 6–20)
CO2: 19 mmol/L — ABNORMAL LOW (ref 22–32)
Calcium: 9 mg/dL (ref 8.9–10.3)
Chloride: 109 mmol/L (ref 98–111)
Creatinine, Ser: 0.58 mg/dL (ref 0.44–1.00)
GFR, Estimated: >60 mL/min (ref >60)
Glucose, Bld: 100 mg/dL — ABNORMAL HIGH (ref 70–99)
Potassium: 3.5 mmol/L (ref 3.5–5.1)
Sodium: 138 mmol/L (ref 135–145)

## 2021-05-29 LAB — WET PREP, GENITAL
Clue Cells Wet Prep HPF POC: NONE SEEN
Sperm: NONE SEEN
Trich, Wet Prep: NONE SEEN
WBC, Wet Prep HPF POC: <10 (ref <10)
Yeast Wet Prep HPF POC: NONE SEEN

## 2021-05-29 LAB — ABO/RH: ABO/RH(D): A POS

## 2021-05-29 LAB — HCG, QUANTITATIVE, PREGNANCY: hCG, Beta Chain, Quant, S: 1101 m[IU]/mL — ABNORMAL HIGH (ref <5)

## 2021-05-29 IMAGING — US US OB < 14 WEEKS - US OB TV
1 series · 13 of 28 positions shown · non-contrast
Comparison: None Available.

CLINICAL DATA: Vaginal bleeding, cramping.  Beta HCG of 1,101

EXAM:
OBSTETRIC <14 WK US AND TRANSVAGINAL OB US
TECHNIQUE: Both transabdominal and transvaginal ultrasound examinations were
performed for complete evaluation of the gestation as well as the
maternal uterus, adnexal regions, and pelvic cul-de-sac.
Transvaginal technique was performed to assess early pregnancy.

[Series 1: us ob less than 14 weeks with ob transvaginal · 64 acquisitions, 13 frames shown]
[im 3/64]
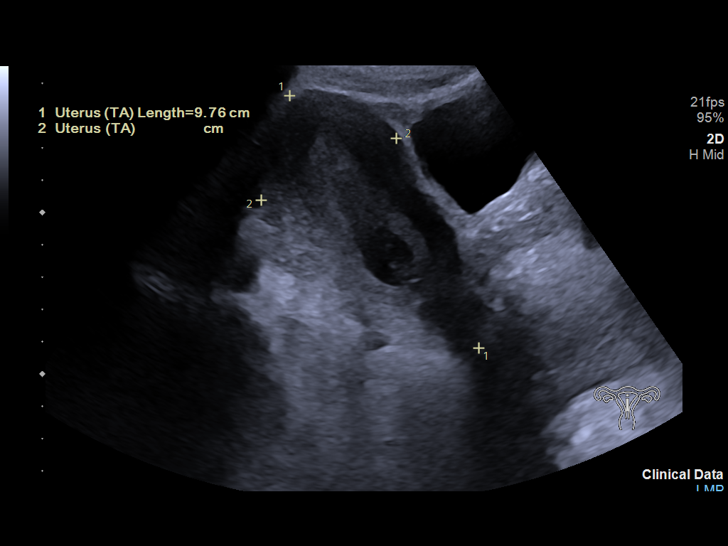
[im 8/64]
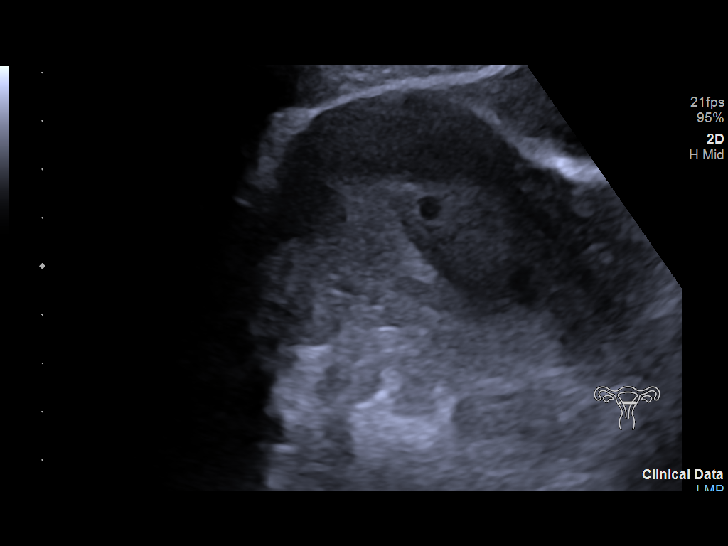
[im 12/64]
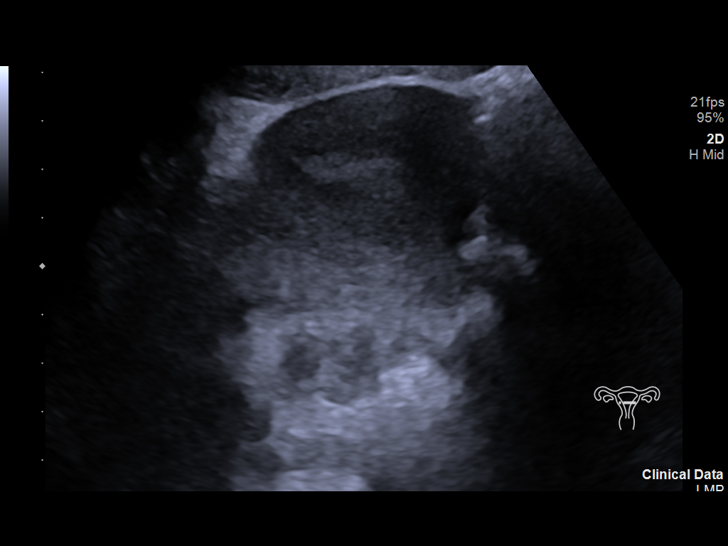
[im 17/64]
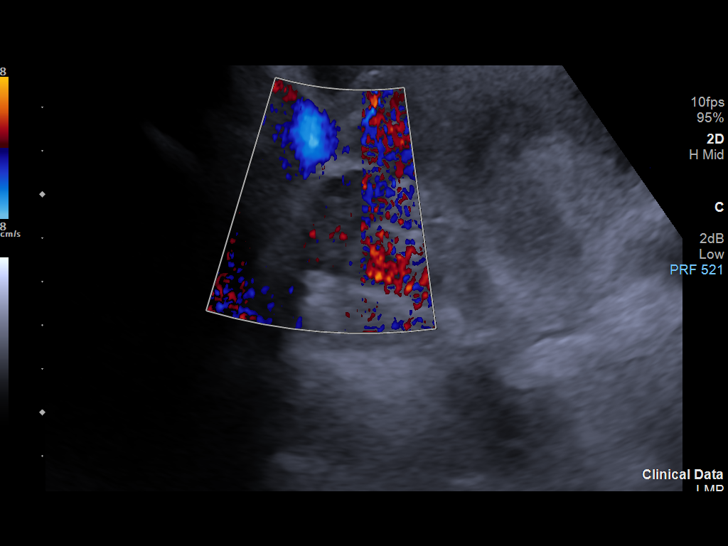
[im 22/64]
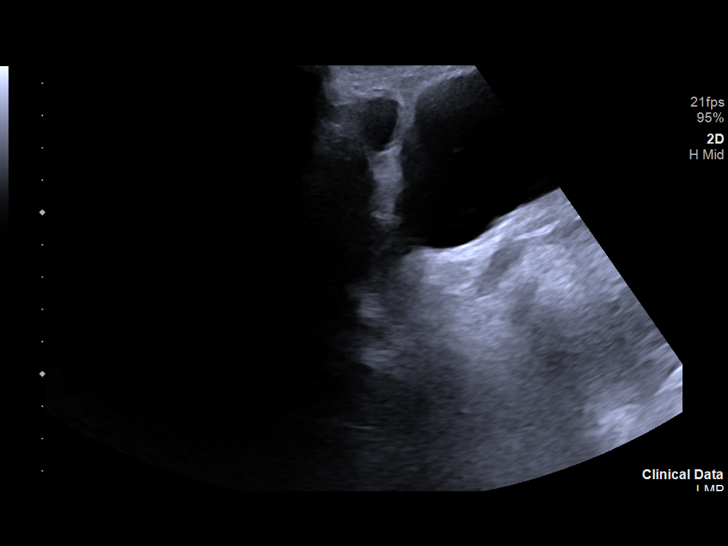
[im 26/64]
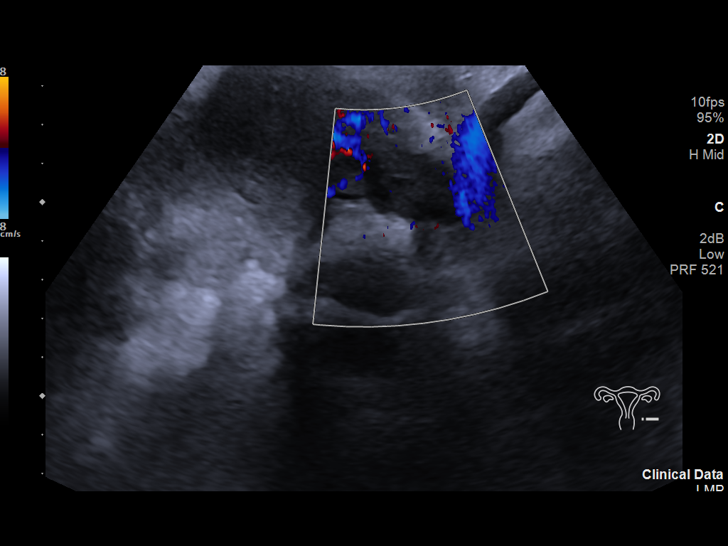
[im 33/64]
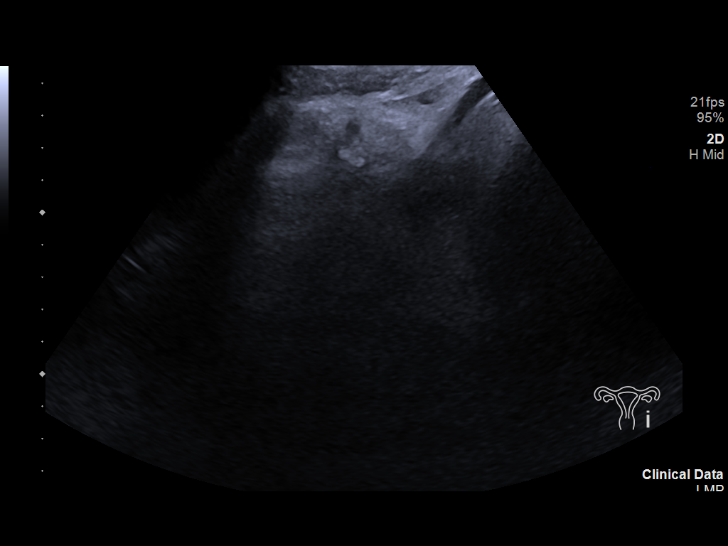
[im 38/64]
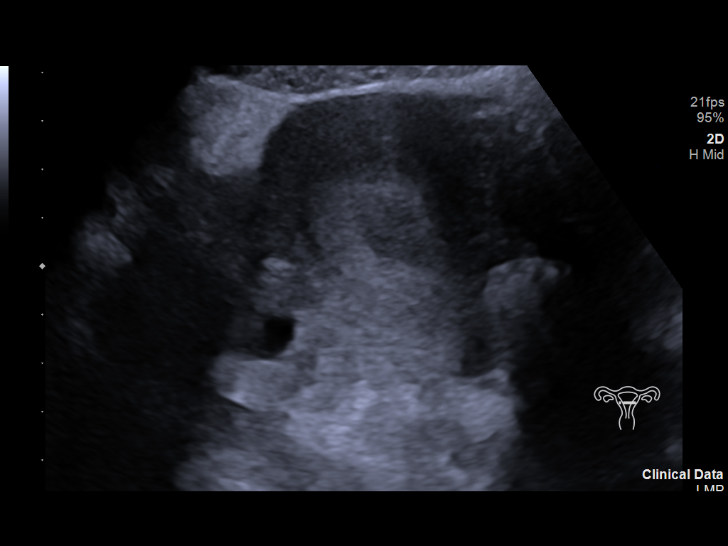
[im 43/64]
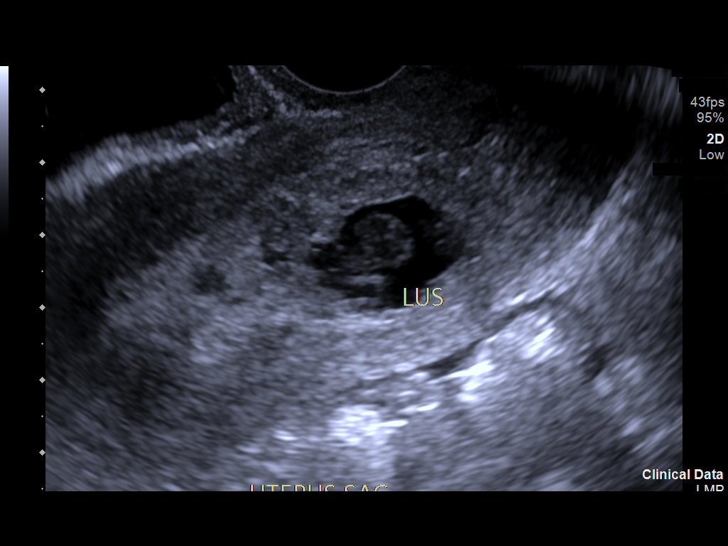
[im 47/64]
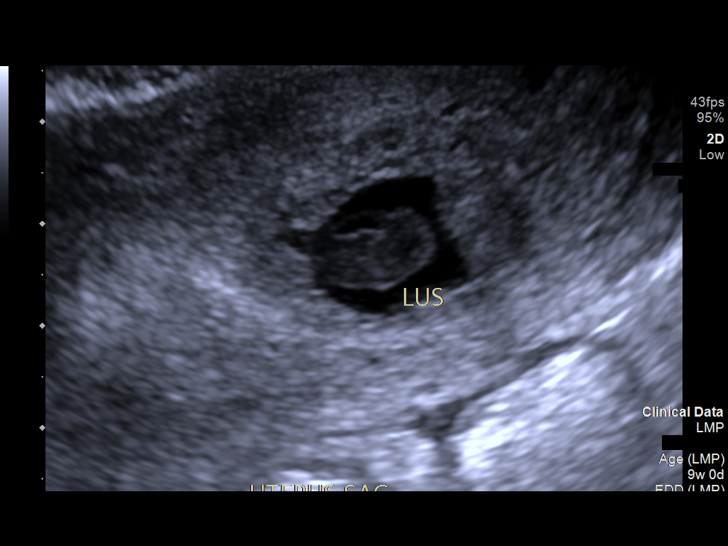
[im 52/64]
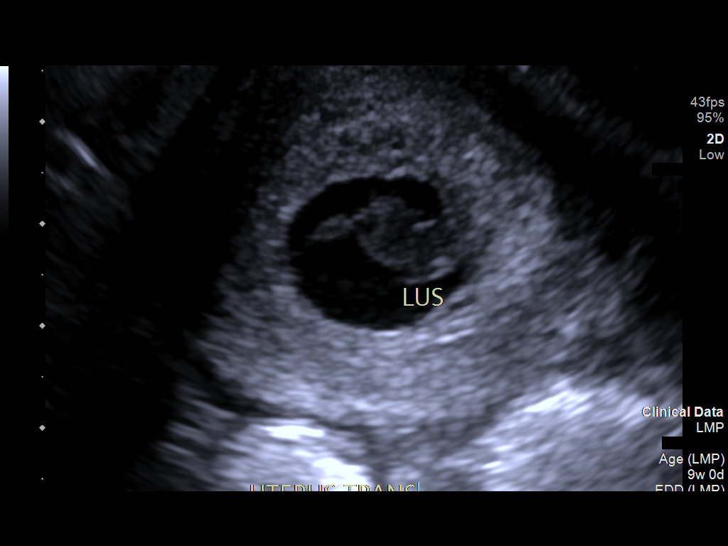
[im 57/64]
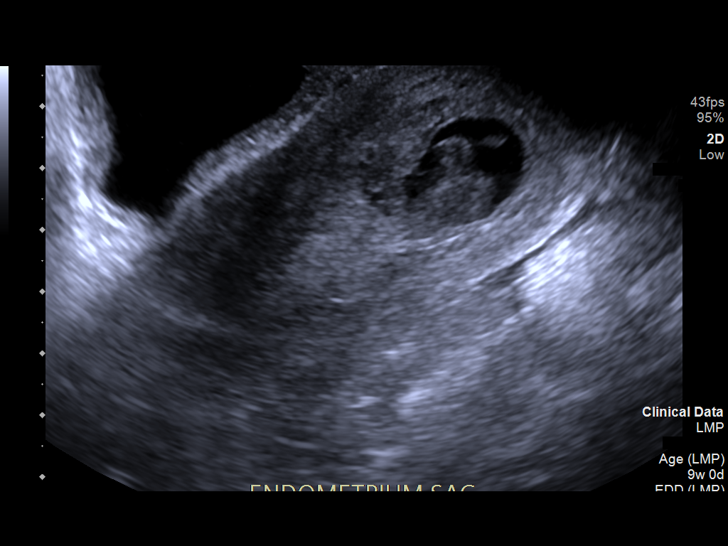
[im 61/64]
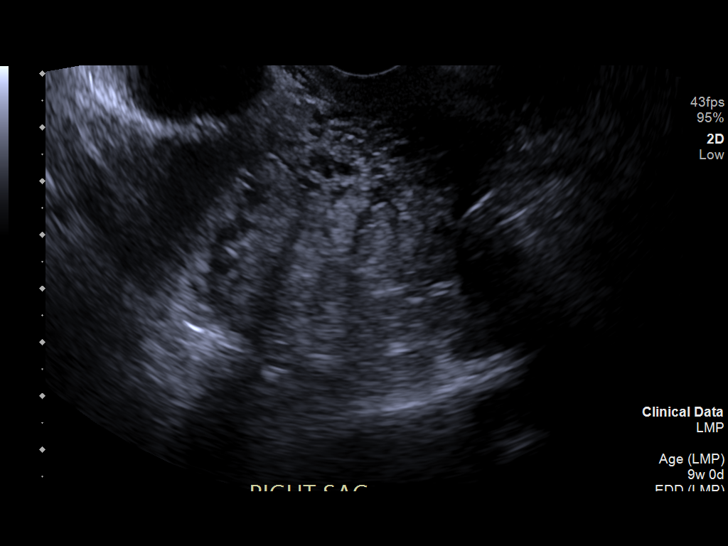

[13 of 28 positions shown; findings below may reference images not displayed]

FINDINGS: Intrauterine gestational sac: Single intrauterine gestational sac
positioned in the lower uterine segment.

Yolk sac:  Visualized.

Embryo:  Visualized.

Cardiac Activity: Not Visualized.

Heart Rate: 0 bpm

CRL:  11.2 mm   7 w   2 d

Subchorionic hemorrhage:  Small.

Maternal uterus/adnexae: Cervix appears closed. Bilateral ovaries
and adnexal regions within normal limits. No significant free fluid
within the pelvis.
IMPRESSION: Embryo containing endometrial sac positioned in the lower uterine
segment. No cardiac activity. Findings meet definitive criteria for
failed pregnancy and likely represent abortion in progress. This
follows SRU consensus guidelines: Diagnostic Criteria for Nonviable
Pregnancy Early in the First Trimester. N Engl J Med

## 2021-05-29 MED ORDER — MISOPROSTOL 200 MCG PO TABS: 400.0000 ug | ORAL_TABLET | Freq: Three times a day (TID) | ORAL | 0 refills | Status: DC

## 2021-05-29 MED ORDER — IBUPROFEN 800 MG PO TABS
800.0000 mg | ORAL_TABLET | Freq: Once | ORAL | Status: AC
  Administered 2021-05-29: 800 mg via ORAL
  Filled 2021-05-29: qty 1

## 2021-05-29 MED ORDER — MISOPROSTOL 200 MCG PO TABS
800.0000 ug | ORAL_TABLET | Freq: Once | ORAL | Status: AC
  Administered 2021-05-29: 800 ug via ORAL
  Filled 2021-05-29: qty 4

## 2021-05-29 NOTE — ED Provider Notes (Signed)
? EMERGENCY DEPARTMENT ?Provider Note ? ? ?CSN: [id number] ?Arrival date & time: 05/29/21  0736 ? ?  ? ?History ? ?Chief Complaint  ?Patient presents with  ? Vaginal Bleeding  ? ? ?Amber [name] is a 31 y.o. female. ? ? ?Vaginal Bleeding ?Associated symptoms: no back pain, no fever and no nausea   ? ?  ? ?Amber [name] is a 31 y.o. female G2 P0 Ab1 who presents to the Emergency Department complaining of lower abdominal cramping and heavy vaginal bleeding symptoms began yesterday.  She had a positive home pregnancy test 1 week ago.  Has not had prenatal care.  Describes crampy lower abdominal pain associated with heavy vaginal bleeding and few dime sized clots since last evening.  Pain improved somewhat this morning after taking ibuprofen, but continues to have vaginal bleeding.  She denies any dysuria, back pain, fever or chills. ? ?Home Medications ?Prior to Admission medications   ?Medication Sig Start Date End Date Taking? Authorizing Provider  ?acetaminophen (TYLENOL) 500 MG tablet Take 2 tablets (1,000 mg total) by mouth every 6 (six) hours as needed for headache or mild pain. 01/21/21   Barrett, Erin R, PA-C  ?aspirin 325 MG EC tablet Take 1 tablet (325 mg total) by mouth daily. 03/01/21   Jacklynn Ganong, FNP  ?escitalopram (LEXAPRO) 20 MG tablet Take 1 tablet (20 mg total) by mouth at bedtime. 01/11/20   Russella Dar, NP  ?gabapentin (NEURONTIN) 300 MG capsule Take 1 capsule (300 mg total) by mouth 3 (three) times daily. 01/21/21   Barrett, Erin R, PA-C  ?gabapentin (NEURONTIN) 600 MG tablet Take 600 mg by mouth at bedtime.    [provider]  ?hydrOXYzine (ATARAX) 25 MG tablet Take 25 mg by mouth 2 (two) times daily. 02/04/21   [provider]  ?ibuprofen (ADVIL) 600 MG tablet Take 1 tablet (600 mg total) by mouth every 6 (six) hours as needed. 10/02/20   Persons, [place], PA  ?lidocaine (LIDODERM) 5 % Place 2 patches onto the skin daily. Remove & Discard patch within 12  hours or as directed by MD 01/21/21   Barrett, Rae Roam, PA-C  ?Multiple Vitamin (MULTIVITAMIN WITH MINERALS) TABS tablet Take 1 tablet by mouth daily. 01/12/20   Russella Dar, NP  ?   ? ?Allergies    ?Bee venom and Other   ? ?Review of Systems   ?Review of Systems  ?Constitutional:  Negative for chills and fever.  ?Respiratory:  Negative for shortness of breath.   ?Cardiovascular:  Negative for chest pain.  ?Gastrointestinal:  Negative for diarrhea, nausea and vomiting.  ?     Lower abdominal cramping  ?Genitourinary:  Positive for vaginal bleeding.  ?Musculoskeletal:  Negative for back pain.  ?Neurological:  Negative for weakness and numbness.  ? ?Physical Exam ?Updated Vital Signs ?BP 117/78   Pulse 82   Temp 98.1 ?F (36.7 ?C) (Oral)   Resp 17   Ht 5\' 1"  (1.549 m)   Wt 52.2 kg   LMP  (LMP Unknown)   SpO2 100%   BMI 21.73 kg/m?  ?Physical Exam ?Vitals and nursing note reviewed. Exam conducted with a chaperone present.  ?Constitutional:   ?   Appearance: Normal appearance. She is not ill-appearing.  ?Cardiovascular:  ?   Rate and Rhythm: Normal rate and regular rhythm.  ?   Pulses: Normal pulses.  ?Pulmonary:  ?   Effort: Pulmonary effort is normal.  ?Abdominal:  ?  Palpations: Abdomen is soft.  ?   Tenderness: There is no abdominal tenderness.  ?Genitourinary: ?   Cervix: Cervical bleeding present.  ?   Adnexa:     ?   Right: No mass or tenderness.      ?   Left: No mass or tenderness.    ?   Comments: Pelvic exam performed by me, chaperone at bedside.  Moderate amount of blood in the vaginal vault.  Cervical os is closed.  No palpable adnexal masses or tenderness.  No palpable enlargement of the uterus ?Musculoskeletal:     ?   General: Normal range of motion.  ?   Right lower leg: No edema.  ?   Left lower leg: No edema.  ?Skin: ?   General: Skin is warm.  ?   Capillary Refill: Capillary refill takes less than 2 seconds.  ?Neurological:  ?   General: No focal deficit present.  ?   Mental Status: She is  alert.  ?   Sensory: No sensory deficit.  ?   Motor: No weakness.  ? ? ?ED Results / Procedures / Treatments   ?Labs ?(all labs ordered are listed, but only abnormal results are displayed) ?Labs Reviewed  ?BASIC METABOLIC PANEL - Abnormal; Notable for the following components:  ?    Result Value  ? CO2 19 (*)   ? Glucose, Bld 100 (*)   ? All other components within normal limits  ?URINALYSIS, ROUTINE W REFLEX MICROSCOPIC - Abnormal; Notable for the following components:  ? Hgb urine dipstick LARGE (*)   ? RBC / HPF >50 (*)   ? Bacteria, UA FEW (*)   ? All other components within normal limits  ?HCG, QUANTITATIVE, PREGNANCY - Abnormal; Notable for the following components:  ? hCG, Beta Chain, Quant, S 1,101 (*)   ? All other components within normal limits  ?WET PREP, GENITAL  ?CBC WITH DIFFERENTIAL/PLATELET  ?ABO/RH  ?GC/CHLAMYDIA PROBE AMP ([place]  ? ? ?EKG ?None ? ?Radiology ?US OB LESS THAN 14 WEEKS WITH OB TRANSVAGINAL ? ?Result Date: 05/29/2021 ?CLINICAL DATA:  Vaginal bleeding, cramping.  Beta HCG of 1,101 EXAM: OBSTETRIC <14 WK US AND TRANSVAGINAL OB US TECHNIQUE: Both transabdominal and transvaginal ultrasound examinations were performed for complete evaluation of the gestation as well as the maternal uterus, adnexal regions, and pelvic cul-de-sac. Transvaginal technique was performed to assess early pregnancy. COMPARISON:  None Available. FINDINGS: Intrauterine gestational sac: Single intrauterine gestational sac positioned in the lower uterine segment. Yolk sac:  Visualized. Embryo:  Visualized. Cardiac Activity: Not Visualized. Heart Rate: 0 bpm CRL:  11.2 mm   7 w   2 d Subchorionic hemorrhage:  Small. Maternal uterus/adnexae: Cervix appears closed. Bilateral ovaries and adnexal regions within normal limits. No significant free fluid within the pelvis. IMPRESSION: Embryo containing endometrial sac positioned in the lower uterine segment. No cardiac activity. Findings meet definitive  criteria for failed pregnancy and likely represent abortion in progress. This follows SRU consensus guidelines: Diagnostic Criteria for Nonviable Pregnancy Early in the First Trimester. Macy Mis Engl J Med [phone]. Electronically Signed   By: Duanne GuessNicholas  Plundo D.O.   On: 05/29/2021 13:04   ? ?Procedures ?Procedures  ? ? ?Medications Ordered in ED ?Medications  ?ibuprofen (ADVIL) tablet 800 mg (800 mg Oral Given 05/29/21 1216)  ? ? ?ED Course/ Medical Decision Making/ A&P ?  ?                        ?  Medical Decision Making ?Patient here with positive home pregnancy test 1 week ago.  Developed lower abdominal pain and vaginal bleeding last evening.  Bleeding has persisted.  Abdominal pain improved after ibuprofen.  No prenatal care. ? ?On exam, patient well-appearing nontoxic.  Vital signs reassuring.  She is ambulatory with steady gait.  She has moderate amount of vaginal bleeding with few small clots noted.  Cervical os is closed.  Symptoms likely related to threatened miscarriage.  If quant elevated will proceed with ultrasound for further evaluation. ? ?Amount and/or Complexity of Data Reviewed ?Labs: ordered. ?   Details: Labs interpreted by me, no evidence of leukocytosis or anemia.  Chemistries without acute derangement.  Blood type is a positive, quant 1101 wet prep without acute findings ?Radiology: ordered. ?   Details: Patient with elevated quant and abdominal pain and bleeding, proceeded with OB ultrasound that shows embryo containing endometrial sac without presence of cardiac activity.  Findings meet definitive criteria for failed pregnancy and likely represent an abortion in process. ?Discussion of management or test interpretation with external provider(s): Discussed findings with OB, Dr. Despina Hidden who recommends patient be given Cytotec here and prescription for 400 mg 3 times a day for 3 days.  He will follow patient in clinic. ? ?Patient has been observed in the department after receiving oral Cytotec.   Reports some abdominal cramping but is tolerating liquids and foods without difficulty.  States cramping is not severe.  She appears appropriate for discharge home.  [facility]  return p

## 2021-05-29 NOTE — ED Triage Notes (Signed)
Pt states she took pregnancy test 1 week ago, came back positive. Started having heavy vaginal bleeding yesterday. Has not seen OB yet. ?

## 2021-05-29 NOTE — Discharge Instructions (Signed)
Your ultrasound today shows that you are likely having a miscarriage.  Please continue the medication as directed until it is finished.  You may have some continued bleeding but this should subside.  Call the OB/GYN office listed to arrange a follow-up appointment.  Return to the emergency department for any new or worsening symptoms. ?

## 2021-05-30 LAB — GC/CHLAMYDIA PROBE AMP (~~LOC~~) NOT AT ARMC
Chlamydia: NEGATIVE
Comment: NEGATIVE
Comment: NORMAL
Neisseria Gonorrhea: NEGATIVE

## 2021-06-03 ENCOUNTER — Encounter: Payer: Self-pay | Admitting: Adult Health

## 2021-06-03 ENCOUNTER — Ambulatory Visit (INDEPENDENT_AMBULATORY_CARE_PROVIDER_SITE_OTHER): Payer: Self-pay | Admitting: Adult Health

## 2021-06-03 VITALS — BP 114/75 | HR 92 | Ht 61.0 in | Wt 115.5 lb

## 2021-06-03 DIAGNOSIS — Z3201 Encounter for pregnancy test, result positive: Secondary | ICD-10-CM

## 2021-06-03 DIAGNOSIS — O039 Complete or unspecified spontaneous abortion without complication: Secondary | ICD-10-CM

## 2021-06-03 LAB — POCT URINE PREGNANCY: Preg Test, Ur: POSITIVE — AB

## 2021-06-03 NOTE — Progress Notes (Signed)
  Subjective:     Patient ID: Amber [name], female   DOB: 1990-02-03, 31 y.o.   MRN: [id number]  HPI Amber [name] is a 31 year old white female, single, G2P0020, here in follow up on being seen in ER 05/29/21 at [facility] for bleeding and pain, she had had +HPT the week before and she said she had depo in March. Blood type was A+, QHCG was 1101 and YS showed: IMPRESSION: Embryo containing endometrial sac positioned in the lower uterine segment. No cardiac activity. Findings meet definitive criteria for failed pregnancy and likely represent abortion in progress. This follows SRU consensus guidelines: Diagnostic Criteria for Nonviable Pregnancy Early in the First Trimester. Macy Mis J Med [phone]. She passed clot and tissue yesterday, has no pain and bleeding like period. PCP is [facility].  Review of Systems Bleeding like period now Denies any pain Reviewed past medical,surgical, social and family history. Reviewed medications and allergies.     Objective:   Physical Exam BP 114/75 (BP Location: Left Arm, Patient Position: Sitting, Cuff Size: Normal)   Pulse 92   Ht 5\' 1"  (1.549 m)   Wt 115 lb 8 oz (52.4 kg)   LMP  (LMP Unknown)   Breastfeeding No   BMI 21.82 kg/m     UPT is litely + Skin warm and dry. Lungs: clear to ausculation bilaterally. Cardiovascular: regular rate and rhythm.  Pelvic is deferred.   Upstream - 06/03/21 1629       Pregnancy Intention Screening   Does the patient want to become pregnant in the next year? No    Does the patient's partner want to become pregnant in the next year? No    Would the patient like to discuss contraceptive options today? No      Contraception Wrap Up   Current Method Abstinence    End Method Abstinence    Contraception Counseling Provided Yes             Assessment:     1. Miscarriage UPT still litely + Will check QHCG    No sex till follows up with PCP   Plan:    Follow up with PCP Follow up prn

## 2021-06-04 LAB — BETA HCG QUANT (REF LAB): hCG Quant: 45 m[IU]/mL

## 2021-06-11 ENCOUNTER — Ambulatory Visit (HOSPITAL_COMMUNITY)
Admission: RE | Admit: 2021-06-11 | Discharge: 2021-06-11 | Disposition: A | Discharge status: Home / Self Care | Payer: Medicaid Other | Source: Ambulatory Visit | Attending: Cardiology | Admitting: Cardiology

## 2021-06-11 ENCOUNTER — Encounter (HOSPITAL_COMMUNITY): Payer: Self-pay | Admitting: Cardiology

## 2021-06-11 VITALS — BP 120/70 | HR 87 | Wt 117.6 lb

## 2021-06-11 DIAGNOSIS — I509 Heart failure, unspecified: Secondary | ICD-10-CM | POA: Insufficient documentation

## 2021-06-11 DIAGNOSIS — Z7982 Long term (current) use of aspirin: Secondary | ICD-10-CM | POA: Insufficient documentation

## 2021-06-11 DIAGNOSIS — I5022 Chronic systolic (congestive) heart failure: Secondary | ICD-10-CM

## 2021-06-11 DIAGNOSIS — Z8614 Personal history of Methicillin resistant Staphylococcus aureus infection: Secondary | ICD-10-CM | POA: Insufficient documentation

## 2021-06-11 DIAGNOSIS — M009 Pyogenic arthritis, unspecified: Secondary | ICD-10-CM | POA: Insufficient documentation

## 2021-06-11 DIAGNOSIS — I071 Rheumatic tricuspid insufficiency: Secondary | ICD-10-CM | POA: Insufficient documentation

## 2021-06-11 DIAGNOSIS — Z86711 Personal history of pulmonary embolism: Secondary | ICD-10-CM | POA: Insufficient documentation

## 2021-06-11 DIAGNOSIS — F1290 Cannabis use, unspecified, uncomplicated: Secondary | ICD-10-CM | POA: Insufficient documentation
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
## 2021-06-11 NOTE — Patient Instructions (Signed)
There has been no changes to your medications. ? ?Your physician recommends that you schedule a follow-up appointment in: 6 months. ? ?If you have any questions or concerns before your next appointment please send us a message through mychart or call our office at [phone].   ? ?TO LEAVE A MESSAGE FOR THE NURSE SELECT OPTION 2, PLEASE LEAVE A MESSAGE INCLUDING: ?YOUR NAME ?DATE OF BIRTH ?CALL BACK NUMBER ?REASON FOR CALL**this is important as we prioritize the call backs ? ?YOU WILL RECEIVE A CALL BACK THE SAME DAY AS LONG AS YOU CALL BEFORE 4:00 PM ? ?At the [facility], you and your health needs are our priority. As part of our continuing mission to provide you with exceptional heart care, we have created designated Provider Care Teams. These Care Teams include your primary Cardiologist (physician) and Advanced Practice Providers (APPs- Physician Assistants and Nurse Practitioners) who all work together to provide you with the care you need, when you need it.  ? ?You may see any of the following providers on your designated Care Team at your next follow up: ?Dr Daniel Bensimhon ?Dr Dalton McLean ?Amy Clegg, NP ?Brittainy Simmons, PA ?Jessica Milford,NP ?Lindsay Finch, PA ?Lauren Kemp, PharmD ? ? ?Please be sure to bring in all your medications bottles to every appointment.  ? ? ?

## 2021-10-22 ENCOUNTER — Encounter (HOSPITAL_COMMUNITY): Payer: Self-pay | Admitting: Internal Medicine

## 2021-10-22 ENCOUNTER — Emergency Department (HOSPITAL_COMMUNITY)
Admission: EM | Admit: 2021-10-22 | Discharge: 2021-10-22 | Disposition: A | Discharge status: Home / Self Care | Payer: No Typology Code available for payment source | Attending: Emergency Medicine | Admitting: Emergency Medicine

## 2021-10-22 ENCOUNTER — Encounter (HOSPITAL_COMMUNITY): Payer: Self-pay | Admitting: Emergency Medicine

## 2021-10-22 ENCOUNTER — Emergency Department (HOSPITAL_COMMUNITY): Payer: No Typology Code available for payment source

## 2021-10-22 ENCOUNTER — Other Ambulatory Visit: Payer: Self-pay

## 2021-10-22 DIAGNOSIS — S0181XA Laceration without foreign body of other part of head, initial encounter: Secondary | ICD-10-CM | POA: Diagnosis not present

## 2021-10-22 DIAGNOSIS — Y92410 Unspecified street and highway as the place of occurrence of the external cause: Secondary | ICD-10-CM | POA: Insufficient documentation

## 2021-10-22 DIAGNOSIS — Z79899 Other long term (current) drug therapy: Secondary | ICD-10-CM | POA: Diagnosis not present

## 2021-10-22 DIAGNOSIS — S8012XA Contusion of left lower leg, initial encounter: Secondary | ICD-10-CM | POA: Insufficient documentation

## 2021-10-22 DIAGNOSIS — S0993XA Unspecified injury of face, initial encounter: Secondary | ICD-10-CM | POA: Diagnosis present

## 2021-10-22 DIAGNOSIS — Y990 Civilian activity done for income or pay: Secondary | ICD-10-CM | POA: Diagnosis not present

## 2021-10-22 DIAGNOSIS — S8011XA Contusion of right lower leg, initial encounter: Secondary | ICD-10-CM | POA: Insufficient documentation

## 2021-10-22 LAB — COMPREHENSIVE METABOLIC PANEL
ALT: 21 U/L (ref 0–44)
AST: 27 U/L (ref 15–41)
Albumin: 3.9 g/dL (ref 3.5–5.0)
Alkaline Phosphatase: 73 U/L (ref 38–126)
Anion gap: 7 (ref 5–15)
BUN: 21 mg/dL — ABNORMAL HIGH (ref 6–20)
CO2: 25 mmol/L (ref 22–32)
Calcium: 9.5 mg/dL (ref 8.9–10.3)
Chloride: 107 mmol/L (ref 98–111)
Creatinine, Ser: 0.7 mg/dL (ref 0.44–1.00)
GFR, Estimated: >60 mL/min (ref >60)
Glucose, Bld: 102 mg/dL — ABNORMAL HIGH (ref 70–99)
Potassium: 3.7 mmol/L (ref 3.5–5.1)
Sodium: 139 mmol/L (ref 135–145)
Total Bilirubin: 0.7 mg/dL (ref 0.3–1.2)
Total Protein: 7 g/dL (ref 6.5–8.1)

## 2021-10-22 LAB — CBC WITH DIFFERENTIAL/PLATELET
Abs Immature Granulocytes: 0.02 10*3/uL (ref 0.00–0.07)
Basophils Absolute: 0 10*3/uL (ref 0.0–0.1)
Basophils Relative: 0 %
Eosinophils Absolute: 0.3 10*3/uL (ref 0.0–0.5)
Eosinophils Relative: 5 %
HCT: 38.7 % (ref 36.0–46.0)
Hemoglobin: 12.7 g/dL (ref 12.0–15.0)
Immature Granulocytes: 0 %
Lymphocytes Relative: 19 %
Lymphs Abs: 1.3 10*3/uL (ref 0.7–4.0)
MCH: 29.1 pg (ref 26.0–34.0)
MCHC: 32.8 g/dL (ref 30.0–36.0)
MCV: 88.8 fL (ref 80.0–100.0)
Monocytes Absolute: 0.6 10*3/uL (ref 0.1–1.0)
Monocytes Relative: 9 %
Neutro Abs: 4.4 10*3/uL (ref 1.7–7.7)
Neutrophils Relative %: 67 %
Platelets: 172 10*3/uL (ref 150–400)
RBC: 4.36 MIL/uL (ref 3.87–5.11)
RDW: 12.9 % (ref 11.5–15.5)
WBC: 6.6 10*3/uL (ref 4.0–10.5)
nRBC: 0 % (ref 0.0–0.2)

## 2021-10-22 LAB — ETHANOL: Alcohol, Ethyl (B): <10 mg/dL (ref <10)

## 2021-10-22 LAB — PROTIME-INR
INR: 1 (ref 0.8–1.2)
Prothrombin Time: 13 seconds (ref 11.4–15.2)

## 2021-10-22 LAB — RAPID URINE DRUG SCREEN, HOSP PERFORMED
Amphetamines: NOT DETECTED
Barbiturates: NOT DETECTED
Benzodiazepines: NOT DETECTED
Cocaine: NOT DETECTED
Opiates: NOT DETECTED
Tetrahydrocannabinol: POSITIVE — AB

## 2021-10-22 LAB — PREGNANCY, URINE: Preg Test, Ur: NEGATIVE

## 2021-10-22 MED ORDER — KETOROLAC TROMETHAMINE 15 MG/ML IJ SOLN
15.0000 mg | Freq: Once | INTRAMUSCULAR | Status: AC
  Administered 2021-10-22: 15 mg via INTRAVENOUS
  Filled 2021-10-22: qty 1

## 2021-10-22 MED ORDER — ACETAMINOPHEN 500 MG PO TABS
1000.0000 mg | ORAL_TABLET | Freq: Once | ORAL | Status: AC
  Administered 2021-10-22: 1000 mg via ORAL
  Filled 2021-10-22: qty 2
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
## 2021-11-18 ENCOUNTER — Encounter (HOSPITAL_COMMUNITY): Payer: Self-pay

## 2021-11-18 ENCOUNTER — Ambulatory Visit (HOSPITAL_COMMUNITY)
Admission: RE | Admit: 2021-11-18 | Discharge: 2021-11-18 | Disposition: A | Discharge status: Home / Self Care | Payer: No Typology Code available for payment source | Source: Ambulatory Visit | Attending: Family Medicine | Admitting: Family Medicine

## 2021-11-18 VITALS — BP 110/82 | HR 79 | Wt 115.0 lb

## 2021-11-18 DIAGNOSIS — Z86711 Personal history of pulmonary embolism: Secondary | ICD-10-CM | POA: Diagnosis not present

## 2021-11-18 DIAGNOSIS — B999 Unspecified infectious disease: Secondary | ICD-10-CM | POA: Diagnosis not present

## 2021-11-18 DIAGNOSIS — Z8614 Personal history of Methicillin resistant Staphylococcus aureus infection: Secondary | ICD-10-CM | POA: Insufficient documentation

## 2021-11-18 DIAGNOSIS — I079 Rheumatic tricuspid valve disease, unspecified: Secondary | ICD-10-CM

## 2021-11-18 DIAGNOSIS — F1990 Other psychoactive substance use, unspecified, uncomplicated: Secondary | ICD-10-CM

## 2021-11-18 DIAGNOSIS — L02419 Cutaneous abscess of limb, unspecified: Secondary | ICD-10-CM

## 2021-11-18 DIAGNOSIS — Z9889 Other specified postprocedural states: Secondary | ICD-10-CM

## 2021-11-18 DIAGNOSIS — Z7982 Long term (current) use of aspirin: Secondary | ICD-10-CM | POA: Diagnosis not present

## 2021-11-18 MED ORDER — ASPIRIN 81 MG PO TBEC: 81.0000 mg | DELAYED_RELEASE_TABLET | Freq: Every day | ORAL | 3 refills | Status: AC

## 2021-11-18 NOTE — Patient Instructions (Addendum)
Thank you for coming in today  RESTART Aspirin 81 mg 1 tablet daily   Your physician recommends that you schedule a follow-up appointment in:  6 months with Dr. Aundra Dubin with echocardiogram   Your physician has requested that you have an echocardiogram. Echocardiography is a painless test that uses sound waves to create images of your heart. It provides your doctor with information about the size and shape of your heart and how well your heart's chambers and valves are working. This procedure takes approximately one hour. There are no restrictions for this procedure.     Do the following things EVERYDAY: Weigh yourself in the morning before breakfast. Write it down and keep it in a log. Take your medicines as prescribed Eat low salt foods--Limit salt (sodium) to 2000 mg per day.  Stay as active as you can everyday Limit all fluids for the day to less than 2 liters  At the [facility], you and your health needs are our priority. As part of our continuing mission to provide you with exceptional heart care, we have created designated Provider Care Teams. These Care Teams include your primary Cardiologist (physician) and Advanced Practice Providers (APPs- Physician Assistants and Nurse Practitioners) who all work together to provide you with the care you need, when you need it.   You may see any of the following providers on your designated Care Team at your next follow up: Dr Glori Bickers Dr Loralie Champagne Dr. Roxana Hires, NP Lyda Jester, [place] [facility] [place], [place] Forestine Na, NP Audry Riles, PharmD   Please be sure to bring in all your medications bottles to every appointment.   If you have any questions or concerns before your next appointment please send [name] a message through [place] or call our office at [phone].    TO LEAVE A MESSAGE FOR THE NURSE SELECT OPTION 2, PLEASE LEAVE A MESSAGE INCLUDING: YOUR NAME DATE OF  BIRTH CALL BACK NUMBER REASON FOR CALL**this is important as we prioritize the call backs  YOU WILL RECEIVE A CALL BACK THE SAME DAY AS LONG AS YOU CALL BEFORE 4:00 PM
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
## 2021-11-23 ENCOUNTER — Other Ambulatory Visit: Payer: Self-pay

## 2021-11-23 ENCOUNTER — Emergency Department (HOSPITAL_COMMUNITY)
Admission: EM | Admit: 2021-11-23 | Discharge: 2021-11-23 | Discharge status: Against Medical Advice | Payer: No Typology Code available for payment source

## 2021-11-23 ENCOUNTER — Emergency Department (HOSPITAL_COMMUNITY)
Admission: EM | Admit: 2021-11-23 | Discharge: 2021-11-24 | Disposition: A | Discharge status: Home / Self Care | Payer: No Typology Code available for payment source | Attending: Emergency Medicine | Admitting: Emergency Medicine

## 2021-11-23 DIAGNOSIS — N9489 Other specified conditions associated with female genital organs and menstrual cycle: Secondary | ICD-10-CM | POA: Insufficient documentation

## 2021-11-23 DIAGNOSIS — N3090 Cystitis, unspecified without hematuria: Secondary | ICD-10-CM | POA: Diagnosis not present

## 2021-11-23 DIAGNOSIS — Z79899 Other long term (current) drug therapy: Secondary | ICD-10-CM | POA: Diagnosis not present

## 2021-11-23 DIAGNOSIS — Z7982 Long term (current) use of aspirin: Secondary | ICD-10-CM | POA: Insufficient documentation

## 2021-11-23 DIAGNOSIS — R1084 Generalized abdominal pain: Secondary | ICD-10-CM

## 2021-11-23 DIAGNOSIS — R109 Unspecified abdominal pain: Secondary | ICD-10-CM | POA: Diagnosis present

## 2021-11-23 LAB — URINALYSIS, ROUTINE W REFLEX MICROSCOPIC
Bilirubin Urine: NEGATIVE
Glucose, UA: NEGATIVE mg/dL
Hgb urine dipstick: NEGATIVE
Ketones, ur: 20 mg/dL — AB
Nitrite: POSITIVE — AB
Protein, ur: 30 mg/dL — AB
Specific Gravity, Urine: 1.018 (ref 1.005–1.030)
pH: 5 (ref 5.0–8.0)

## 2021-11-23 LAB — CBC WITH DIFFERENTIAL/PLATELET
Abs Immature Granulocytes: 0.04 10*3/uL (ref 0.00–0.07)
Basophils Absolute: 0.1 10*3/uL (ref 0.0–0.1)
Basophils Relative: 0 %
Eosinophils Absolute: 0.1 10*3/uL (ref 0.0–0.5)
Eosinophils Relative: 1 %
HCT: 44.5 % (ref 36.0–46.0)
Hemoglobin: 15 g/dL (ref 12.0–15.0)
Immature Granulocytes: 0 %
Lymphocytes Relative: 15 %
Lymphs Abs: 1.8 10*3/uL (ref 0.7–4.0)
MCH: 29.2 pg (ref 26.0–34.0)
MCHC: 33.7 g/dL (ref 30.0–36.0)
MCV: 86.7 fL (ref 80.0–100.0)
Monocytes Absolute: 0.9 10*3/uL (ref 0.1–1.0)
Monocytes Relative: 7 %
Neutro Abs: 9.8 10*3/uL — ABNORMAL HIGH (ref 1.7–7.7)
Neutrophils Relative %: 77 %
Platelets: 299 10*3/uL (ref 150–400)
RBC: 5.13 MIL/uL — ABNORMAL HIGH (ref 3.87–5.11)
RDW: 13.5 % (ref 11.5–15.5)
WBC: 12.6 10*3/uL — ABNORMAL HIGH (ref 4.0–10.5)
nRBC: 0 % (ref 0.0–0.2)

## 2021-11-23 LAB — COMPREHENSIVE METABOLIC PANEL
ALT: 31 U/L (ref 0–44)
AST: 30 U/L (ref 15–41)
Albumin: 4.6 g/dL (ref 3.5–5.0)
Alkaline Phosphatase: 79 U/L (ref 38–126)
Anion gap: 17 — ABNORMAL HIGH (ref 5–15)
BUN: 20 mg/dL (ref 6–20)
CO2: 16 mmol/L — ABNORMAL LOW (ref 22–32)
Calcium: 9.7 mg/dL (ref 8.9–10.3)
Chloride: 106 mmol/L (ref 98–111)
Creatinine, Ser: 1.19 mg/dL — ABNORMAL HIGH (ref 0.44–1.00)
GFR, Estimated: >60 mL/min (ref >60)
Glucose, Bld: 216 mg/dL — ABNORMAL HIGH (ref 70–99)
Potassium: 3.9 mmol/L (ref 3.5–5.1)
Sodium: 139 mmol/L (ref 135–145)
Total Bilirubin: 0.4 mg/dL (ref 0.3–1.2)
Total Protein: 8.3 g/dL — ABNORMAL HIGH (ref 6.5–8.1)

## 2021-11-23 LAB — I-STAT BETA HCG BLOOD, ED (MC, WL, AP ONLY): I-stat hCG, quantitative: 14.2 m[IU]/mL — ABNORMAL HIGH (ref <5)

## 2021-11-23 LAB — SALICYLATE LEVEL: Salicylate Lvl: <7 mg/dL — ABNORMAL LOW (ref 7.0–30.0)

## 2021-11-23 LAB — LIPASE, BLOOD: Lipase: 30 U/L (ref 11–51)

## 2021-11-23 LAB — ETHANOL: Alcohol, Ethyl (B): <10 mg/dL (ref <10)

## 2021-11-23 LAB — ACETAMINOPHEN LEVEL: Acetaminophen (Tylenol), Serum: <10 ug/mL — ABNORMAL LOW (ref 10–30)

## 2021-11-23 LAB — PREGNANCY, URINE: Preg Test, Ur: NEGATIVE

## 2021-11-23 NOTE — ED Provider Triage Note (Signed)
  Emergency Medicine Provider Triage Evaluation Note  MRN:  [id number]  Arrival date & time: 11/23/21    Medically screening exam initiated at 10:37 PM.   CC:   Abdominal pain.  HPI:  Amber [name] is a 31 y.o. year-old female presents to the ED with chief complaint of abdominal pain.  Onset 3 days ago.  Mostly left sided.  Denies fever, chills, vomiting, or diarrhea.  Keeps falling asleep during interview.  History provided by patient. ROS:  -As included in HPI PE:   Vitals:   11/23/21 2236  BP: (!) 153/104  Pulse: (!) 108  Resp: 20  Temp: 98.8 F (37.1 C)  SpO2: 95%    Non-toxic appearing No respiratory distress  MDM:  Undifferentiated abdominal pain. Question substance abuse. I've ordered labs in triage to expedite lab/diagnostic workup.  Patient was informed that the remainder of the evaluation will be completed by another provider, this initial triage assessment does not replace that evaluation, and the importance of remaining in the ED until their evaluation is complete.    Roxy Horseman, PA-C 11/23/21 2237

## 2021-11-23 NOTE — ED Triage Notes (Signed)
Pt reported to ED with c/o left sided abdominal pain that has been ongoing for past few days. Denies any N/V, fevers or urinary symptoms.

## 2021-11-23 NOTE — ED Notes (Signed)
Pt name called in lobby x3 with no answer.

## 2021-11-24 LAB — RAPID URINE DRUG SCREEN, HOSP PERFORMED
Amphetamines: NOT DETECTED
Barbiturates: NOT DETECTED
Benzodiazepines: NOT DETECTED
Cocaine: NOT DETECTED
Opiates: NOT DETECTED
Tetrahydrocannabinol: POSITIVE — AB

## 2021-11-24 LAB — CBG MONITORING, ED: Glucose-Capillary: 109 mg/dL — ABNORMAL HIGH (ref 70–99)

## 2021-11-24 MED ORDER — OMEPRAZOLE 20 MG PO CPDR: 20.0000 mg | DELAYED_RELEASE_CAPSULE | Freq: Every day | ORAL | 1 refills | Status: AC

## 2021-11-24 MED ORDER — CEPHALEXIN 500 MG PO CAPS: 500.0000 mg | ORAL_CAPSULE | Freq: Four times a day (QID) | ORAL | 0 refills | Status: DC

## 2021-11-24 MED ORDER — DICYCLOMINE HCL 20 MG PO TABS: 20.0000 mg | ORAL_TABLET | Freq: Two times a day (BID) | ORAL | 0 refills | Status: AC | PRN

## 2021-11-24 NOTE — ED Provider Notes (Signed)
[facility] EMERGENCY DEPARTMENT Provider Note   CSN: [id number] Arrival date & time: 11/23/21  2157     History  No chief complaint on file.   Amber [name] is a 31 y.o. female.  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  Patient describes bilateral upper abdominal pain that started 3 days ago that radiates down into the lower abdomen on both sides.  She tells me that it is fairly similar on both sides.  She has had nausea and dry heaving but no vomiting.  No dysuria, increased frequency or urgency, but does feel that she does not completely empty.  Also reports early satiety and although she has an appetite, does not feel like she can eat a lot.  No fevers, chest pain or shortness of breath.  She does take 324 mg aspirin daily due to previous heart surgery.  Does endorse that she is under a lot of stress recently and also recently discontinued THC which she thinks has affected her appetite.      Home Medications Prior to Admission medications   Medication Sig Start Date End Date Taking? Authorizing Provider  acetaminophen (TYLENOL) 500 MG tablet Take 2 tablets (1,000 mg total) by mouth every 6 (six) hours as needed for headache or mild pain. 01/21/21   Barrett, Rae Roam, PA-C  aspirin EC 81 MG tablet Take 1 tablet (81 mg total) by mouth daily. Swallow whole. 11/18/21   [place]ford, Anderson Malta, FNP  escitalopram (LEXAPRO) 20 MG tablet Take 1 tablet (20 mg total) by mouth at bedtime. 01/11/20   Russella Dar, NP      Allergies    Bee venom and Other    Review of Systems   Review of Systems  Physical Exam Updated Vital Signs BP (!) 139/97   Pulse 74   Temp 98.3 F (36.8 C) (Oral)   Resp 18   SpO2 96%   Physical  Exam Vitals and nursing note reviewed.  Constitutional:      General: She is not in acute distress.    Appearance: She is well-developed.  HENT:     Head: Normocephalic and atraumatic.     Right Ear: External ear normal.     Left Ear: External ear normal.     Nose: Nose normal.     Mouth/Throat:     Mouth: Mucous membranes are moist.  Eyes:     Conjunctiva/sclera: Conjunctivae normal.  Cardiovascular:     Rate and Rhythm: Normal rate and regular rhythm.     Heart sounds: No murmur heard. Pulmonary:     Effort: No respiratory distress.     Breath sounds: No wheezing, rhonchi or rales.  Abdominal:     Palpations: Abdomen is soft.     Tenderness: There is abdominal tenderness. There is no guarding or rebound.     Comments: Mild generalized tenderness without rebound or guarding  Musculoskeletal:     Cervical back: Normal range of motion and neck supple.     Right lower leg: No edema.     Left lower leg: No edema.  Skin:    General: Skin is warm and dry.     Findings: No rash.  Neurological:     General: No focal deficit present.     Mental Status: She is alert.  Mental status is at baseline.     Motor: No weakness.  Psychiatric:        Mood and Affect: Mood is anxious.     ED Results / Procedures / Treatments   Labs (all labs ordered are listed, but only abnormal results are displayed) Labs Reviewed  COMPREHENSIVE METABOLIC PANEL - Abnormal; Notable for the following components:      Result Value   CO2 16 (*)    Glucose, Bld 216 (*)    Creatinine, Ser 1.19 (*)    Total Protein 8.3 (*)    Anion gap 17 (*)    All other components within normal limits  CBC WITH DIFFERENTIAL/PLATELET - Abnormal; Notable for the following components:   WBC 12.6 (*)    RBC 5.13 (*)    Neutro Abs 9.8 (*)    All other components within normal limits  URINALYSIS, ROUTINE W REFLEX MICROSCOPIC - Abnormal; Notable for the following components:   APPearance CLOUDY (*)    Ketones, ur 20 (*)     Protein, ur 30 (*)    Nitrite POSITIVE (*)    Leukocytes,Ua TRACE (*)    Bacteria, UA MANY (*)    All other components within normal limits  RAPID URINE DRUG SCREEN, HOSP PERFORMED - Abnormal; Notable for the following components:   Tetrahydrocannabinol POSITIVE (*)    All other components within normal limits  SALICYLATE LEVEL - Abnormal; Notable for the following components:   Salicylate Lvl <7.0 (*)    All other components within normal limits  ACETAMINOPHEN LEVEL - Abnormal; Notable for the following components:   Acetaminophen (Tylenol), Serum <10 (*)    All other components within normal limits  I-STAT BETA HCG BLOOD, ED ([facility], [facility], [facility] ONLY) - Abnormal; Notable for the following components:   I-stat hCG, quantitative 14.2 (*)    All other components within normal limits  CBG MONITORING, ED - Abnormal; Notable for the following components:   Glucose-Capillary 109 (*)    All other components within normal limits  LIPASE, BLOOD  ETHANOL  PREGNANCY, URINE    EKG None  Radiology No results found.  Procedures Procedures    Medications Ordered in ED Medications - No data to display  ED Course/ Medical Decision Making/ A&P    Patient seen and examined. History obtained directly from patient. Work-up including labs, imaging, EKG ordered in triage, if performed, were reviewed.    Labs/EKG: Independently reviewed and interpreted.  This included: CBC demonstrating mildly elevated white blood cell count at 12.6, normal hemoglobin; CMP with normal electrolytes, slightly elevated glucose at 216, anion gap 17; lipase normal at 30; UA with some white blood cells and positive nitrite suspicious for infection; negative UDS, ethanol, salicylate, acetaminophen.  Repeat blood glucose at 109.  Imaging: None ordered  Medications/Fluids: None ordered  Most recent vital signs reviewed and are as follows: BP (!) 143/102 (BP Location: Right Arm)   Pulse 84   Temp 98.2 F (36.8 C)  (Oral)   Resp 20   SpO2 98%   Initial impression: Nonspecific abdominal pain.  Patient does use NSAIDs daily and is at risk for an element of gastritis.  This could explain upper abdominal pain with early satiety.  Patient does not have any significant irritative UTI symptoms except for sensation of incomplete emptying, but given mildly elevated white blood cell count and abdominal pain, will treat.  Home treatment plan: Omeprazole for possibility of gastritis, Bentyl for intermittent abdominal pain, Keflex for UTI.  Return instructions discussed with patient: The patient was urged to return to the Emergency Department immediately with worsening of current symptoms, worsening abdominal pain, persistent vomiting, blood noted in stools, fever, or any other concerns. The patient verbalized understanding.   Follow-up instructions discussed with patient: Encourage PCP follow-up in the next 3 to 5 days for recheck.                          Medical Decision Making  For this patient's complaint of abdominal pain, the following conditions were considered on the differential diagnosis: gastritis/PUD, enteritis/duodenitis, appendicitis, cholelithiasis/cholecystitis, cholangitis, pancreatitis, ruptured viscus, colitis, diverticulitis, small/large bowel obstruction, proctitis, cystitis, pyelonephritis, ureteral colic, aortic dissection, aortic aneurysm. In women, ectopic pregnancy, pelvic inflammatory disease, ovarian cysts, and tubo-ovarian abscess were also considered. Atypical chest etiologies were also considered including ACS, PE, and pneumonia.   The patient's vital signs, pertinent lab work and imaging were reviewed and interpreted as discussed in the ED course. Hospitalization was considered for further testing, treatments, or serial exams/observation. However as patient is well-appearing, has a stable exam, and reassuring studies today, I do not feel that they warrant admission at this time. This plan  was discussed with the patient who verbalizes agreement and comfort with this plan and seems reliable and able to return to the Emergency Department with worsening or changing symptoms.          Final Clinical Impression(s) / ED Diagnoses Final diagnoses:  Generalized abdominal pain  Cystitis    Rx / DC Orders ED Discharge Orders          Ordered    cephALEXin (KEFLEX) 500 MG capsule  4 times daily        11/24/21 1223    omeprazole (PRILOSEC) 20 MG capsule  Daily        11/24/21 1223    dicyclomine (BENTYL) 20 MG tablet  2 times daily PRN        11/24/21 1223              Renne Crigler, PA-C 11/24/21 1232    Tegeler, Canary Brim, MD 11/24/21 1601

## 2021-11-24 NOTE — Discharge Instructions (Signed)
Please read and follow all provided instructions.  Your diagnoses today include:  1. Generalized abdominal pain   2. Cystitis     Tests performed today include: Blood cell counts and platelets: Infection fighting cell count was just slightly high Kidney and liver function tests:  Pancreas function test (called lipase): Normal Urine test to look for infection: Suggest infection with some white blood cell and positive nitrate chemical Vital signs. See below for your results today.   Medications prescribed:  Omeprazole (Prilosec) - stomach acid reducer  This medication can be found over-the-counter  Bentyl - medication for intestinal cramps and spasms  Keflex (cephalexin) - antibiotic for urinary tract infection  You have been prescribed an antibiotic medicine: take the entire course of medicine even if you are feeling better. Stopping early can cause the antibiotic not to work.  Take any prescribed medications only as directed.  Home care instructions:  Follow any educational materials contained in this packet.  Follow-up instructions: Please follow-up with your primary care provider in the next 3 days for further evaluation of your symptoms.    Return instructions:  SEEK IMMEDIATE MEDICAL ATTENTION IF: The pain does not go away or becomes severe  A temperature above 101F develops  Repeated vomiting occurs (multiple episodes)  The pain becomes localized to portions of the abdomen. The right side could possibly be appendicitis. In an adult, the left lower portion of the abdomen could be colitis or diverticulitis.  Blood is being passed in stools or vomit (bright red or black tarry stools)  You develop chest pain, difficulty breathing, dizziness or fainting, or become confused, poorly responsive, or inconsolable (young children) If you have any other emergent concerns regarding your health  Additional Information: Abdominal (belly) pain can be caused by many things. Your  caregiver performed an examination and possibly ordered blood/urine tests and imaging (CT scan, x-rays, ultrasound). Many cases can be observed and treated at home after initial evaluation in the emergency department. Even though you are being discharged home, abdominal pain can be unpredictable. Therefore, you need a repeated exam if your pain does not resolve, returns, or worsens. Most patients with abdominal pain don't have to be admitted to the hospital or have surgery, but serious problems like appendicitis and gallbladder attacks can start out as nonspecific pain. Many abdominal conditions cannot be diagnosed in one visit, so follow-up evaluations are very important.  Your vital signs today were: BP (!) 143/102 (BP Location: Right Arm)   Pulse 84   Temp 98.2 F (36.8 C) (Oral)   Resp 20   SpO2 98%  If your blood pressure (bp) was elevated above 135/85 this visit, please have this repeated by your doctor within one month. --------------

## 2022-03-24 ENCOUNTER — Encounter (HOSPITAL_COMMUNITY): Payer: Self-pay | Admitting: Internal Medicine

## 2022-03-24 ENCOUNTER — Ambulatory Visit
Admission: EM | Admit: 2022-03-24 | Discharge: 2022-03-24 | Disposition: A | Discharge status: Home / Self Care | Payer: No Typology Code available for payment source | Attending: Physician Assistant | Admitting: Physician Assistant

## 2022-03-24 ENCOUNTER — Encounter: Payer: Self-pay | Admitting: Emergency Medicine

## 2022-03-24 DIAGNOSIS — F419 Anxiety disorder, unspecified: Secondary | ICD-10-CM | POA: Insufficient documentation

## 2022-03-24 DIAGNOSIS — J029 Acute pharyngitis, unspecified: Secondary | ICD-10-CM | POA: Diagnosis present

## 2022-03-24 DIAGNOSIS — F17290 Nicotine dependence, other tobacco product, uncomplicated: Secondary | ICD-10-CM | POA: Diagnosis not present

## 2022-03-24 DIAGNOSIS — Z1152 Encounter for screening for COVID-19: Secondary | ICD-10-CM | POA: Insufficient documentation

## 2022-03-24 DIAGNOSIS — R051 Acute cough: Secondary | ICD-10-CM | POA: Diagnosis present

## 2022-03-24 DIAGNOSIS — R0989 Other specified symptoms and signs involving the circulatory and respiratory systems: Secondary | ICD-10-CM | POA: Insufficient documentation

## 2022-03-24 DIAGNOSIS — Z79899 Other long term (current) drug therapy: Secondary | ICD-10-CM | POA: Insufficient documentation

## 2022-03-24 DIAGNOSIS — J101 Influenza due to other identified influenza virus with other respiratory manifestations: Secondary | ICD-10-CM | POA: Diagnosis not present

## 2022-03-24 DIAGNOSIS — F17210 Nicotine dependence, cigarettes, uncomplicated: Secondary | ICD-10-CM | POA: Diagnosis not present

## 2022-03-24 DIAGNOSIS — I10 Essential (primary) hypertension: Secondary | ICD-10-CM | POA: Diagnosis not present

## 2022-03-24 LAB — GROUP A STREP BY PCR: Group A Strep by PCR: NOT DETECTED

## 2022-03-24 LAB — RESP PANEL BY RT-PCR (RSV, FLU A&B, COVID)  RVPGX2
Influenza A by PCR: NEGATIVE
Influenza B by PCR: POSITIVE — AB
Resp Syncytial Virus by PCR: NEGATIVE
SARS Coronavirus 2 by RT PCR: NEGATIVE

## 2022-03-24 MED ORDER — LIDOCAINE VISCOUS HCL 2 % MT SOLN: 15.0000 mL | OROMUCOSAL | 0 refills | Status: AC | PRN

## 2022-03-24 MED ORDER — PROMETHAZINE-DM 6.25-15 MG/5ML PO SYRP: 5.0000 mL | ORAL_SOLUTION | Freq: Four times a day (QID) | ORAL | 0 refills | Status: AC | PRN

## 2022-03-24 MED ORDER — OSELTAMIVIR PHOSPHATE 75 MG PO CAPS: 75.0000 mg | ORAL_CAPSULE | Freq: Two times a day (BID) | ORAL | 0 refills | Status: AC

## 2022-03-24 NOTE — ED Triage Notes (Signed)
Pt c/o sore throat, cough, hoarseness, nasal congestion, runny nose, wheezing, and body aches. Started yesterday. Denies fever.

## 2022-03-24 NOTE — Discharge Instructions (Signed)
-  You have influenza B.  I sent Tamiflu, cough medicine and viscous lidocaine for symptoms.  May also take Tylenol and ibuprofen.  Increase rest and fluids.  You should be feeling better in the next 1 to 2 weeks. - If you start to have shortness of breath or weakness, return or go to ER for evaluation. - You should stay home until your fever free for 24 hours and feeling better.  As long as you are having symptoms, consider wearing a mask to protect others from getting ill.

## 2022-03-24 NOTE — ED Provider Notes (Signed)
[facility]    CSN: [id number] Arrival date & time: 03/24/22  0809      History   Chief Complaint Chief Complaint  Patient presents with   Sore Throat    HPI Amber [name] is a 32 y.o. female presenting for onset of fatigue, body aches, sore throat/painful swallowing, cough, congestion/runny nose and chest pressure that began yesterday.  Denies fever but has felt warm.  Denies breathing difficulty, vomiting or diarrhea.  Denies any sick contacts or known exposure to strep, flu or COVID.  Not currently taking medication for symptoms.  No other complaints.  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, sequela    Chronic hepatitis C without hepatic coma ([place]) 09/26/2020   Medication monitoring encounter 09/26/2020   Immunization counseling 01/26/2020   IV infiltrate, subsequent encounter 01/16/2020   Septic pulmonary embolism without acute cor pulmonale ([place]) 01/16/2020   Hydropneumothorax 01/16/2020   Severe tricuspid regurgitation 01/16/2020   GAD (generalized anxiety disorder) 01/16/2020   Hepatitis C, chronic ([place]) 01/16/2020   Anemia, chronic disease 01/16/2020   Protein-calorie malnutrition, severe 12/27/2019   MRSA infection 12/06/2019   Opioid use with withdrawal ([place])    Endocarditis of tricuspid valve    Palliative care encounter    Septic shock ([place]) 11/30/2019   Septic embolism ([place]) 11/05/2019   AKI  (acute kidney injury) ([name]) 11/05/2019   Hyponatremia 11/05/2019   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   Bacterial endocarditis 11/05/2019   Acute bacterial endocarditis    MRSA bacteremia    Sepsis ([place]) 11/03/2019   Tenosynovitis of ankle 07/21/2019   Cellulitis and abscess of left leg 07/19/2019   Abscess of face 08/20/2018   Facial cellulitis 11/21/2017   Depression    IV drug abuse ([place])    Suicidal ideation     Past Surgical History:  Procedure Laterality Date   APPLICATION OF ANGIOVAC N/A 12/05/2019   Procedure: APPLICATION OF ANGIOVAC;  Surgeon: Lajuana Matte, MD;  Location: [place];  Service: Vascular;  Laterality: N/A;  Patient will need intraoperative TEE Can be performed in OR 14, 15, or 17 if OR 16 is not available   CARDIAC CATHETERIZATION     HARDWARE REMOVAL Right 09/28/2020   Procedure: REMOVAL DEEP HARDWARE RIGHT TIBIA;  Surgeon: Newt Minion, MD;  Location: [place];  Service: Orthopedics;  Laterality: Right;   RIGHT HEART CATH N/A 09/25/2020   Procedure: RIGHT HEART CATH;  Surgeon: Larey Dresser, MD;  Location: [facility];  Service: Cardiovascular;  Laterality: N/A;   right tibia Right    "shattered knee" and had to have hardware placed   TEE WITHOUT CARDIOVERSION N/A 11/07/2019   Procedure: TRANSESOPHAGEAL ECHOCARDIOGRAM (TEE);  Surgeon: Josue Hector, MD;  Location: [place];  Service: Cardiovascular;  Laterality: N/A;   TEE WITHOUT CARDIOVERSION  12/05/2019   Procedure: TRANSESOPHAGEAL ECHOCARDIOGRAM (TEE);  Surgeon: Lajuana Matte, MD;  Location: [place];  Service: Vascular;;   TEE WITHOUT CARDIOVERSION N/A 12/05/2019   Procedure: TRANSESOPHAGEAL ECHOCARDIOGRAM (TEE);  Surgeon: Lajuana Matte, MD;  Location: [place];  Service: Thoracic;  Laterality: N/A;   TEE WITHOUT CARDIOVERSION N/A 01/17/2021   Procedure: TRANSESOPHAGEAL ECHOCARDIOGRAM (TEE);  Surgeon: Lajuana Matte, MD;  Location: [place];  Service: Open Heart Surgery;   Laterality: N/A;   TRICUSPID VALVE REPLACEMENT N/A 01/17/2021   Procedure: TRICUSPID VALVE REPAIR USING EDWARDS MC3 28 TRICUSPID RING;  Surgeon: Lajuana Matte, MD;  Location: [place];  Service: Open Heart Surgery;  Laterality: N/A;    OB History     Gravida  2   Para  0   Term      Preterm      AB  2   Living  0      SAB  0   IAB      Ectopic      Multiple      Live Births               Home Medications    Prior to Admission medications   Medication Sig Start Date End Date Taking? Authorizing Provider  aspirin EC 81 MG tablet Take 1 tablet (81 mg total) by mouth daily. Swallow whole. 11/18/21  Yes [place], Maricela Bo, FNP  escitalopram (LEXAPRO) 20 MG tablet Take 1 tablet (20 mg total) by mouth at bedtime. 01/11/20  Yes Samella Parr, NP  gabapentin (NEURONTIN) 300 MG capsule Take by mouth. 02/28/22  Yes [provider]  lidocaine (XYLOCAINE) 2 % solution Use as directed 15 mLs in the mouth or throat every 3 (three) hours as needed for mouth pain (swish and spit). 03/24/22  Yes Danton Clap, PA-C  omeprazole (PRILOSEC) 20 MG capsule Take 1 capsule (20 mg total) by mouth daily. 11/24/21  Yes [place] Cater, PA-C  oseltamivir (TAMIFLU) 75 MG capsule Take 1 capsule (75 mg total) by mouth every 12 (twelve) hours for 5 days. 03/24/22 03/29/22 Yes Danton Clap, PA-C  promethazine-dextromethorphan (PROMETHAZINE-DM) 6.25-15 MG/5ML syrup Take 5 mLs by mouth 4 (four) times daily as needed. 03/24/22  Yes Danton Clap, PA-C  acetaminophen (TYLENOL) 500 MG tablet Take 2 tablets (1,000 mg total) by mouth every 6 (six) hours as needed for headache or mild pain. 01/21/21   Barrett, Erin R, PA-C  dicyclomine (BENTYL) 20 MG tablet Take 1 tablet (20 mg total) by mouth 2 (two) times daily as needed (stomach cramping or pain). 11/24/21   [place] Cater, PA-C    Family History Family History  Problem Relation Age of Onset   Valvular heart disease Maternal Grandmother     COPD Maternal Grandfather     Social History Social History   Tobacco Use   Smoking status: Every Day    Types: E-cigarettes, Cigarettes    Last attempt to quit: 11/14/2020    Years since quitting: 1.3   Smokeless tobacco: Never  Vaping Use   Vaping Use: Every day   Start date: 11/15/2020   Substances: Nicotine, Flavoring  Substance Use Topics   Alcohol use: No   Drug use: Not Currently    Types: Marijuana    Comment: hx of heroin use; occ THC     Allergies   Bee venom and Other   Review of Systems Review of Systems  Constitutional:  Positive for fatigue. Negative for chills, diaphoresis and fever.  HENT:  Positive for congestion, rhinorrhea and sore throat. Negative for ear pain, sinus pressure and sinus pain.   Respiratory:  Positive for cough. Negative for shortness of breath and wheezing.   Cardiovascular:  Positive for chest pain ("pressure").  Gastrointestinal:  Negative for abdominal pain, nausea and vomiting.  Musculoskeletal:  Positive for myalgias.  Skin:  Negative for rash.  Neurological:  Negative for weakness and headaches.  Hematological:  Negative for adenopathy.     Physical Exam Triage Vital Signs ED Triage Vitals  Enc Vitals Group     BP      Pulse      Resp      Temp      Temp src      SpO2      Weight      Height      Head Circumference      Peak Flow      Pain Score      Pain Loc      Pain Edu?      Excl. in [place]?    No data found.  Updated Vital Signs BP 113/81 (BP Location: Right Arm)   Pulse 69   Temp 99.6 F (37.6 C) (Oral)   Resp 16   Ht '5\' 1"'$  (1.549 m)   Wt 115 lb 1.3 oz (52.2 kg)   LMP 03/10/2022   SpO2 96%   BMI 21.74 kg/m   Physical Exam Vitals and nursing note reviewed.  Constitutional:      General: She is not in acute distress.    Appearance: Normal appearance. She is well-developed. She is ill-appearing. She is not toxic-appearing.  HENT:     Head: Normocephalic and atraumatic.     Nose: Congestion  present.     Mouth/Throat:     Mouth: Mucous membranes are moist.     Pharynx: Oropharynx is clear. Posterior oropharyngeal erythema present.     Tonsils: 1+ on the right. 1+ on the left.  Eyes:     General: No scleral icterus.       Right eye: No discharge.        Left eye: No discharge.     Conjunctiva/sclera: Conjunctivae normal.  Cardiovascular:     Rate and Rhythm: Normal rate and regular rhythm.     Heart sounds: Normal heart sounds.  Pulmonary:     Effort: Pulmonary effort is normal. No respiratory distress.     Breath sounds: Normal breath sounds.  Musculoskeletal:     Cervical back: Neck supple.  Skin:    General: Skin is dry.  Neurological:     General: No focal deficit present.     Mental Status: She is alert. Mental status is at baseline.     Motor: No weakness.     Gait: Gait normal.  Psychiatric:        Mood and Affect: Mood normal.        Behavior: Behavior normal.        Thought Content: Thought content normal.      UC Treatments / Results  Labs (all labs ordered are listed, but only abnormal results are displayed) Labs Reviewed  RESP PANEL BY RT-PCR (RSV, FLU A&B, COVID)  RVPGX2 - Abnormal; Notable for the following components:      Result Value   Influenza B by PCR POSITIVE (*)    All other components within normal limits  GROUP A  STREP BY PCR    EKG   Radiology No results found.  Procedures Procedures (including critical care time)  Medications Ordered in UC Medications - No data to display  Initial Impression / Assessment and Plan / UC Course  I have reviewed the triage vital signs and the nursing notes.  Pertinent labs & imaging results that were available during my care of the patient were reviewed by me and considered in my medical decision making (see chart for details).   32 year old female presents for fatigue, body aches, cough, congestion, sore throat and chest pressure since yesterday.  Denies fever or breathing difficulty,  vomiting or diarrhea.  No sick contacts.  Vitals normal and stable.  Patient is ill-appearing nontoxic.  She does have voice hoarseness.  On exam is nasal congestion and erythema posterior pharynx with 1+ bilateral enlarged tonsils.  Chest clear to auscultation.  PCR strep negative. Respiratory panel obtained.  Positive influenza B.  Discussed all results with patient.  Sent Tamiflu, Promethazine DM and viscous lidocaine.  Advised rest and fluids.  Reviewed return to work and ER precautions.   Final Clinical Impressions(s) / UC Diagnoses   Final diagnoses:  Influenza B  Sore throat  Acute cough     Discharge Instructions      -You have influenza B.  I sent Tamiflu, cough medicine and viscous lidocaine for symptoms.  May also take Tylenol and ibuprofen.  Increase rest and fluids.  You should be feeling better in the next 1 to 2 weeks. - If you start to have shortness of breath or weakness, return or go to ER for evaluation. - You should stay home until your fever free for 24 hours and feeling better.  As long as you are having symptoms, consider wearing a mask to protect others from getting ill.     ED Prescriptions     Medication Sig Dispense Auth. Provider   promethazine-dextromethorphan (PROMETHAZINE-DM) 6.25-15 MG/5ML syrup Take 5 mLs by mouth 4 (four) times daily as needed. 118 mL Laurene Footman B, PA-C   oseltamivir (TAMIFLU) 75 MG capsule Take 1 capsule (75 mg total) by mouth every 12 (twelve) hours for 5 days. 10 capsule Laurene Footman B, PA-C   lidocaine (XYLOCAINE) 2 % solution Use as directed 15 mLs in the mouth or throat every 3 (three) hours as needed for mouth pain (swish and spit). 100 mL Danton Clap, PA-C      PDMP not reviewed this encounter.   Danton Clap, PA-C 03/24/22 [phone]

## 2023-06-20 ENCOUNTER — Other Ambulatory Visit: Payer: Self-pay

## 2023-06-20 ENCOUNTER — Inpatient Hospital Stay (HOSPITAL_COMMUNITY): Payer: MEDICAID

## 2023-06-20 ENCOUNTER — Encounter (HOSPITAL_COMMUNITY): Payer: Self-pay | Admitting: Internal Medicine

## 2023-06-20 ENCOUNTER — Encounter (HOSPITAL_COMMUNITY): Payer: Self-pay

## 2023-06-20 ENCOUNTER — Observation Stay (HOSPITAL_COMMUNITY)
Admission: EM | Admit: 2023-06-20 | Discharge: 2023-06-20 | Discharge status: Against Medical Advice | Payer: MEDICAID | Attending: Emergency Medicine | Admitting: Emergency Medicine

## 2023-06-20 DIAGNOSIS — O4690 Antepartum hemorrhage, unspecified, unspecified trimester: Principal | ICD-10-CM | POA: Insufficient documentation

## 2023-06-20 DIAGNOSIS — N390 Urinary tract infection, site not specified: Secondary | ICD-10-CM | POA: Diagnosis present

## 2023-06-20 DIAGNOSIS — Z3A22 22 weeks gestation of pregnancy: Secondary | ICD-10-CM

## 2023-06-20 DIAGNOSIS — Z79899 Other long term (current) drug therapy: Secondary | ICD-10-CM | POA: Insufficient documentation

## 2023-06-20 DIAGNOSIS — O26892 Other specified pregnancy related conditions, second trimester: Secondary | ICD-10-CM | POA: Diagnosis not present

## 2023-06-20 DIAGNOSIS — O2342 Unspecified infection of urinary tract in pregnancy, second trimester: Secondary | ICD-10-CM | POA: Diagnosis present

## 2023-06-20 DIAGNOSIS — R102 Pelvic and perineal pain: Secondary | ICD-10-CM

## 2023-06-20 DIAGNOSIS — O4590 Premature separation of placenta, unspecified, unspecified trimester: Secondary | ICD-10-CM | POA: Diagnosis present

## 2023-06-20 DIAGNOSIS — O26899 Other specified pregnancy related conditions, unspecified trimester: Secondary | ICD-10-CM | POA: Insufficient documentation

## 2023-06-20 DIAGNOSIS — R109 Unspecified abdominal pain: Secondary | ICD-10-CM | POA: Diagnosis not present

## 2023-06-20 DIAGNOSIS — Z7982 Long term (current) use of aspirin: Secondary | ICD-10-CM

## 2023-06-20 DIAGNOSIS — Z8249 Family history of ischemic heart disease and other diseases of the circulatory system: Secondary | ICD-10-CM

## 2023-06-20 DIAGNOSIS — O99332 Smoking (tobacco) complicating pregnancy, second trimester: Secondary | ICD-10-CM | POA: Diagnosis present

## 2023-06-20 DIAGNOSIS — O4592 Premature separation of placenta, unspecified, second trimester: Secondary | ICD-10-CM | POA: Diagnosis present

## 2023-06-20 DIAGNOSIS — F17210 Nicotine dependence, cigarettes, uncomplicated: Secondary | ICD-10-CM | POA: Diagnosis present

## 2023-06-20 DIAGNOSIS — Z5329 Procedure and treatment not carried out because of patient's decision for other reasons: Secondary | ICD-10-CM | POA: Diagnosis not present

## 2023-06-20 LAB — URINALYSIS, ROUTINE W REFLEX MICROSCOPIC
Bilirubin Urine: NEGATIVE
Glucose, UA: NEGATIVE mg/dL
Ketones, ur: NEGATIVE mg/dL
Nitrite: POSITIVE — AB
Protein, ur: 30 mg/dL — AB
RBC / HPF: >50 RBC/hpf (ref 0–5)
Specific Gravity, Urine: 1.017 (ref 1.005–1.030)
pH: 6 (ref 5.0–8.0)

## 2023-06-20 LAB — COMPREHENSIVE METABOLIC PANEL WITH GFR
ALT: 18 U/L (ref 0–44)
AST: 24 U/L (ref 15–41)
Albumin: 2.6 g/dL — ABNORMAL LOW (ref 3.5–5.0)
Alkaline Phosphatase: 61 U/L (ref 38–126)
Anion gap: 10 (ref 5–15)
BUN: 8 mg/dL (ref 6–20)
CO2: 21 mmol/L — ABNORMAL LOW (ref 22–32)
Calcium: 8.5 mg/dL — ABNORMAL LOW (ref 8.9–10.3)
Chloride: 103 mmol/L (ref 98–111)
Creatinine, Ser: 0.57 mg/dL (ref 0.44–1.00)
GFR, Estimated: >60 mL/min (ref >60)
Glucose, Bld: 105 mg/dL — ABNORMAL HIGH (ref 70–99)
Potassium: 3.3 mmol/L — ABNORMAL LOW (ref 3.5–5.1)
Sodium: 134 mmol/L — ABNORMAL LOW (ref 135–145)
Total Bilirubin: 0.5 mg/dL (ref 0.0–1.2)
Total Protein: 5.9 g/dL — ABNORMAL LOW (ref 6.5–8.1)

## 2023-06-20 LAB — WET PREP, GENITAL
Sperm: NONE SEEN
Trich, Wet Prep: NONE SEEN
WBC, Wet Prep HPF POC: <10 (ref <10)
Yeast Wet Prep HPF POC: NONE SEEN

## 2023-06-20 LAB — RAPID URINE DRUG SCREEN, HOSP PERFORMED
Amphetamines: POSITIVE — AB
Barbiturates: NOT DETECTED
Benzodiazepines: POSITIVE — AB
Cocaine: NOT DETECTED
Opiates: POSITIVE — AB
Tetrahydrocannabinol: POSITIVE — AB

## 2023-06-20 LAB — CBC
HCT: 22.5 % — ABNORMAL LOW (ref 36.0–46.0)
Hemoglobin: 7.4 g/dL — ABNORMAL LOW (ref 12.0–15.0)
MCH: 25.8 pg — ABNORMAL LOW (ref 26.0–34.0)
MCHC: 32.9 g/dL (ref 30.0–36.0)
MCV: 78.4 fL — ABNORMAL LOW (ref 80.0–100.0)
Platelets: 169 10*3/uL (ref 150–400)
RBC: 2.87 MIL/uL — ABNORMAL LOW (ref 3.87–5.11)
RDW: 15.6 % — ABNORMAL HIGH (ref 11.5–15.5)
WBC: 10.3 10*3/uL (ref 4.0–10.5)
nRBC: 0 % (ref 0.0–0.2)

## 2023-06-20 LAB — POC URINE PREG, ED: Preg Test, Ur: POSITIVE — AB

## 2023-06-20 LAB — HCG, QUANTITATIVE, PREGNANCY: hCG, Beta Chain, Quant, S: 8991 m[IU]/mL — ABNORMAL HIGH (ref <5)

## 2023-06-20 LAB — HIV ANTIBODY (ROUTINE TESTING W REFLEX): HIV Screen 4th Generation wRfx: NONREACTIVE

## 2023-06-20 MED ORDER — CALCIUM CARBONATE ANTACID 500 MG PO CHEW
2.0000 | CHEWABLE_TABLET | ORAL | Status: DC | PRN
Start: 2023-06-20 — End: 2023-06-20

## 2023-06-20 MED ORDER — SODIUM CHLORIDE 0.9% FLUSH: 3.0000 mL | Freq: Two times a day (BID) | INTRAVENOUS | Status: DC

## 2023-06-20 MED ORDER — SODIUM CHLORIDE 0.9% FLUSH: 3.0000 mL | INTRAVENOUS | Status: DC | PRN

## 2023-06-20 MED ORDER — PRENATAL MULTIVITAMIN CH: 1.0000 | ORAL_TABLET | Freq: Every day | ORAL | Status: DC

## 2023-06-20 MED ORDER — ACETAMINOPHEN 500 MG PO TABS
1000.0000 mg | ORAL_TABLET | Freq: Once | ORAL | Status: AC
  Administered 2023-06-20: 1000 mg via ORAL
  Filled 2023-06-20: qty 2

## 2023-06-20 MED ORDER — DOCUSATE SODIUM 100 MG PO CAPS: 100.0000 mg | ORAL_CAPSULE | Freq: Every day | ORAL | Status: DC

## 2023-06-20 MED ORDER — ZOLPIDEM TARTRATE 5 MG PO TABS: 5.0000 mg | ORAL_TABLET | Freq: Every evening | ORAL | Status: DC | PRN

## 2023-06-20 MED ORDER — CYCLOBENZAPRINE HCL 5 MG PO TABS
10.0000 mg | ORAL_TABLET | Freq: Once | ORAL | Status: AC
  Administered 2023-06-20: 10 mg via ORAL
  Filled 2023-06-20: qty 2

## 2023-06-20 MED ORDER — SODIUM CHLORIDE 0.9 % IV SOLN: 250.0000 mL | INTRAVENOUS | Status: DC | PRN

## 2023-06-20 MED ORDER — ACETAMINOPHEN 325 MG PO TABS: 650.0000 mg | ORAL_TABLET | ORAL | Status: DC | PRN

## 2023-06-20 NOTE — MAU Note (Signed)
 Patient has decided to leave AMA. Patient understands the risks of leaving the hospital and also understands she should come back immediately with increased abdominal pain or vaginal bleeding. Loetta Ringer, CNM notified that patient is leaving AMA.

## 2023-06-20 NOTE — ED Triage Notes (Signed)
 Pt arrived via POV afraid she is having a miscarriage. Pt states that she has had multiple positive pregnancy tests. Pt has not been to OB. Pt states that she does not remember when her imp was before January. Pt states that she is bleeding very bad and abd cramping 10/10

## 2023-06-20 NOTE — H&P (Addendum)
 FACULTY PRACTICE ANTEPARTUM ADMISSION HISTORY AND PHYSICAL NOTE   History of Present Illness: Amber [name] is a 33 y.o. G3P0020 at [redacted]w[redacted]d admitted for placental abruption with hemorrhage with Hgb of 7.4 . She presented, to MAU for vaginal bleeding and abdominal pain.  She was found to be 22 weeks by US  and hemorrhage noted. She states she started having cramping around 7 or 8pm with bleeding around 0100.  Patient reports pain 10/10 upon arrival with improvement after tylenol  and flexeril  dosing. She reports current drug usage and last activity at 7pm. No care this pregnancy.   Patient reports uterine contraction  activity as none, but does report constant cramping. Patient reports  vaginal bleeding as less flow than a normal period. Patient describes fluid per vagina as Other bloody. Fetal presentation is cephalic.  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, sequela    Chronic hepatitis C without hepatic coma (HCC) 09/26/2020   Medication monitoring encounter 09/26/2020   Immunization counseling 01/26/2020   IV infiltrate, subsequent encounter 01/16/2020   Septic pulmonary embolism without acute cor pulmonale (HCC) 01/16/2020   Hydropneumothorax 01/16/2020   Severe tricuspid regurgitation 01/16/2020   GAD (generalized anxiety disorder) 01/16/2020   Hepatitis C, chronic (HCC) 01/16/2020   Anemia, chronic disease 01/16/2020   Protein-calorie malnutrition, severe 12/27/2019   MRSA infection 12/06/2019   Opioid use with withdrawal (HCC)    Endocarditis of tricuspid valve    Palliative care encounter    Septic shock (HCC) 11/30/2019   Septic embolism (HCC) 11/05/2019   AKI (acute kidney injury) (HCC) 11/05/2019   Hyponatremia 11/05/2019   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   Bacterial  endocarditis 11/05/2019   Acute bacterial endocarditis    MRSA bacteremia    Sepsis (HCC) 11/03/2019   Tenosynovitis of ankle 07/21/2019   Cellulitis and abscess of left leg 07/19/2019   Abscess of face 08/20/2018   Facial cellulitis 11/21/2017   Depression    IV drug abuse (HCC)    Suicidal ideation     Past Medical History:  Diagnosis Date   Anxiety    Complication of anesthesia    Hard to wake up   Depression    Endocarditis    Hepatitis    Hep C- pt completed medication   Heroin abuse (HCC)    History of kidney stones    Hypertension    Miscarriage    MRSA (methicillin resistant staph aureus) culture positive    Ovarian cyst    UTI (lower urinary tract infection)     Past Surgical History:  Procedure Laterality Date   APPLICATION OF ANGIOVAC N/A 12/05/2019   Procedure: APPLICATION OF ANGIOVAC;  Surgeon: Hilarie Lovely, MD;  Location: [facility] OR;  Service: Vascular;  Laterality: N/A;  Patient will need intraoperative TEE Can be performed in OR 14, 15, or 17 if OR 16 is not available   CARDIAC CATHETERIZATION     HARDWARE REMOVAL Right 09/28/2020   Procedure: REMOVAL DEEP HARDWARE RIGHT TIBIA;  Surgeon: Timothy Ford, MD;  Location: [facility] OR;  Service: Orthopedics;  Laterality: Right;   RIGHT HEART CATH N/A 09/25/2020   Procedure: RIGHT HEART CATH;  Surgeon: Darlis Eisenmenger, MD;  Location: [facility] INVASIVE CV LAB;  Service: Cardiovascular;  Laterality: N/A;   right tibia Right    "  shattered knee" and had to have hardware placed   TEE WITHOUT CARDIOVERSION N/A 11/07/2019   Procedure: TRANSESOPHAGEAL ECHOCARDIOGRAM (TEE);  Surgeon: Loyde Rule, MD;  Location: [facility] ENDOSCOPY;  Service: Cardiovascular;  Laterality: N/A;   TEE WITHOUT CARDIOVERSION  12/05/2019   Procedure: TRANSESOPHAGEAL ECHOCARDIOGRAM (TEE);  Surgeon: Hilarie Lovely, MD;  Location: [facility] OR;  Service: Vascular;;   TEE WITHOUT CARDIOVERSION N/A 12/05/2019   Procedure: TRANSESOPHAGEAL ECHOCARDIOGRAM  (TEE);  Surgeon: Hilarie Lovely, MD;  Location: [facility] OR;  Service: Thoracic;  Laterality: N/A;   TEE WITHOUT CARDIOVERSION N/A 01/17/2021   Procedure: TRANSESOPHAGEAL ECHOCARDIOGRAM (TEE);  Surgeon: Hilarie Lovely, MD;  Location: [facility] OR;  Service: Open Heart Surgery;  Laterality: N/A;   TRICUSPID VALVE REPLACEMENT N/A 01/17/2021   Procedure: TRICUSPID VALVE REPAIR USING EDWARDS MC3 28 TRICUSPID RING;  Surgeon: Hilarie Lovely, MD;  Location: [facility] OR;  Service: Open Heart Surgery;  Laterality: N/A;    OB History  Gravida Para Term Preterm AB Living  3 0   2 0  SAB IAB Ectopic Multiple Live Births  0        # Outcome Date GA Lbr Len/2nd Weight Sex Type Anes PTL Lv  3 Current           2 AB           1 AB             Social History   Socioeconomic History   Marital status: Single    Spouse name: Not on file   Number of children: Not on file   Years of education: Not on file   Highest education level: Not on file  Occupational History   Not on file  Tobacco Use   Smoking status: Every Day    Current packs/day: 0.00    Types: E-cigarettes, Cigarettes    Last attempt to quit: 11/14/2020    Years since quitting: 2.5   Smokeless tobacco: Never  Vaping Use   Vaping status: Every Day   Start date: 11/15/2020   Substances: Nicotine , Flavoring  Substance and Sexual Activity   Alcohol use: No   Drug use: Not Currently    Types: Marijuana    Comment: hx of heroin use; occ THC   Sexual activity: Not Currently    Birth control/protection: Abstinence, Injection  Other Topics Concern   Not on file  Social History Narrative   Not on file   Social Drivers of Health   Financial Resource Strain: Not on file  Food Insecurity: Not on file  Transportation Needs: Not on file  Physical Activity: Not on file  Stress: Not on file  Social Connections: Not on file    Family History  Problem Relation Age of Onset   Valvular heart disease Maternal Grandmother    COPD Maternal  Grandfather     Allergies  Allergen Reactions   Bee Venom Anaphylaxis   Other Anaphylaxis    Bee sting    Medications Prior to Admission  Medication Sig Dispense Refill Last Dose/Taking   acetaminophen  (TYLENOL ) 500 MG tablet Take 2 tablets (1,000 mg total) by mouth every 6 (six) hours as needed for headache or mild pain. 30 tablet 0 More than a month   aspirin  EC 81 MG tablet Take 1 tablet (81 mg total) by mouth daily. Swallow whole. 90 tablet 3 More than a month   dicyclomine  (BENTYL ) 20 MG tablet Take 1 tablet (20 mg total) by mouth 2 (two) times  daily as needed (stomach cramping or pain). 20 tablet 0 More than a month   escitalopram  (LEXAPRO ) 20 MG tablet Take 1 tablet (20 mg total) by mouth at bedtime. 30 tablet 6 More than a month   gabapentin  (NEURONTIN ) 300 MG capsule Take by mouth.   More than a month   lidocaine  (XYLOCAINE ) 2 % solution Use as directed 15 mLs in the mouth or throat every 3 (three) hours as needed for mouth pain (swish and spit). 100 mL 0 More than a month   omeprazole  (PRILOSEC) 20 MG capsule Take 1 capsule (20 mg total) by mouth daily. 30 capsule 1 More than a month   promethazine -dextromethorphan (PROMETHAZINE -DM) 6.25-15 MG/5ML syrup Take 5 mLs by mouth 4 (four) times daily as needed. 118 mL 0 More than a month    Review of Systems - History obtained from the patient  Vitals:  BP 132/70 (BP Location: Left Arm)   Pulse 67   Temp 97.6 F (36.4 C) (Oral)   Resp 16   Ht 5\' 1"  (1.549 m)   Wt 48.4 kg   LMP 03/14/2023   SpO2 100%   BMI 20.14 kg/m   Physical Examination:  Vitals reviewed. Exam conducted with a chaperone present Abran Abrahams, RN and [place], [place]).  Constitutional:      General: She is in acute distress.     Appearance: She is well-developed.  HENT:     Head: Normocephalic and atraumatic.  Cardiovascular:     Rate and Rhythm: Normal rate.  Pulmonary:     Effort: Pulmonary effort is normal. No respiratory distress.  Abdominal:      Tenderness: There is abdominal tenderness in the suprapubic area.  Genitourinary:    General: Normal vulva.     Comments: Speculum Exam: -Normal External Genitalia: Non tender, Sm Bloody discharge at introitus.  -Vaginal Vault: Pink mucosa with good rugae. Small amt blood noted and removed with faux swab x 2 -wet prep collected -Cervix:Pink, no lesions, cysts, or polyps.  Appears closed. No active bleeding, but yellowish clear mucoid discharge noted from os-GC/CT collected -Bimanual Exam:  Deferred d/t pain Musculoskeletal:        General: Normal range of motion.  Skin:    General: Skin is warm and dry.  Neurological:     Mental Status: She is alert.     Cervix: Position: mid position, Dilation: 0cm, Thickness: visually thick, and Consistency: not evaluated fetal presentation is cephalic. Membranes:intact Fetal Monitoring:Not assessed Tocometer: Not assessed  Labs:  Results for orders placed or performed during the hospital encounter of 06/20/23 (from the past 24 hours)  POC urine preg, ED (not at [facility])   Collection Time: 06/20/23  5:10 AM  Result Value Ref Range   Preg Test, Ur POSITIVE (A) NEGATIVE  Wet prep, genital   Collection Time: 06/20/23  6:00 AM  Result Value Ref Range   Yeast Wet Prep HPF POC NONE SEEN NONE SEEN   Trich, Wet Prep NONE SEEN NONE SEEN   Clue Cells Wet Prep HPF POC PRESENT (A) NONE SEEN   WBC, Wet Prep HPF POC <10 <10   Sperm NONE SEEN   Urinalysis, Routine w reflex microscopic -Urine, Clean Catch   Collection Time: 06/20/23  6:02 AM  Result Value Ref Range   Color, Urine AMBER (A) YELLOW   APPearance CLOUDY (A) CLEAR   Specific Gravity, Urine 1.017 1.005 - 1.030   pH 6.0 5.0 - 8.0   Glucose, UA NEGATIVE NEGATIVE mg/dL  Hgb urine dipstick LARGE (A) NEGATIVE   Bilirubin Urine NEGATIVE NEGATIVE   Ketones, ur NEGATIVE NEGATIVE mg/dL   Protein, ur 30 (A) NEGATIVE mg/dL   Nitrite POSITIVE (A) NEGATIVE   Leukocytes,Ua MODERATE (A) NEGATIVE   RBC /  HPF >50 0 - 5 RBC/hpf   WBC, UA 21-50 0 - 5 WBC/hpf   Bacteria, UA RARE (A) NONE SEEN   Squamous Epithelial / HPF 6-10 0 - 5 /HPF   Mucus PRESENT   CBC   Collection Time: 06/20/23  6:04 AM  Result Value Ref Range   WBC 10.3 4.0 - 10.5 K/uL   RBC 2.87 (L) 3.87 - 5.11 MIL/uL   Hemoglobin 7.4 (L) 12.0 - 15.0 g/dL   HCT 16.1 (L) 09.6 - 04.5 %   MCV 78.4 (L) 80.0 - 100.0 fL   MCH 25.8 (L) 26.0 - 34.0 pg   MCHC 32.9 30.0 - 36.0 g/dL   RDW 40.9 (H) 81.1 - 91.4 %   Platelets 169 150 - 400 K/uL   nRBC 0.0 0.0 - 0.2 %    Imaging Studies: US  MFM OB COMP + 14 WK Result Date: 06/20/2023 ----------------------------------------------------------------------  OBSTETRICS REPORT                        (Signed Final 06/20/2023 07:29 am) ---------------------------------------------------------------------- Patient Info  ID #:       [id number]                          D.O.B.:  05/20/1990 (33 yrs)(F)  Name:       Amber [name]                Visit Date: 06/20/2023 06:51 am ---------------------------------------------------------------------- Performed By  Attending:        Jodeen Munch      Ref. Address:      Faculty Practice                    MD  Performed By:     Earnest Goad            [facility]  Referred By:      Loetta Ringer                    CNM ---------------------------------------------------------------------- Orders  #  Description                           Code        Ordered By  1  US  MFM OB COMP + 14 WK                76805.01    Ijanae Macapagal ----------------------------------------------------------------------  #  Order #                     Accession #                Episode #  1  [id number]  [id number]                 [id number] ---------------------------------------------------------------------- Indications  Abdominal pain in pregnancy                     O99.89  [redacted] weeks  gestation of pregnancy                 Z3A.22 ---------------------------------------------------------------------- Fetal Evaluation  Num Of Fetuses:          1  Fetal Heart Rate(bpm):   146  Cardiac Activity:        Observed  Presentation:            Cephalic  Placenta:                Left lateral  P. Cord Insertion:       Visualized, central  Amniotic Fluid  AFI FV:      Within normal limits                              Largest Pocket(cm)                              3.4 ---------------------------------------------------------------------- Biometry  CRL:     171.5  mm     G. Age:  N/A                     EDD:  BPD:        53  mm     G. Age:  22w 1d         39  %    CI:        72.93   %    70 - 86                                                          FL/HC:       18.8  %    18.4 - 20.2  HC:      197.3  mm     G. Age:  21w 6d         23  %    HC/AC:       1.08       1.06 - 1.25  AC:      182.2  mm     G. Age:  23w 0d         67  %    FL/BPD:      70.0  %    71 - 87  FL:       37.1  mm     G. Age:  21w 6d         24  %    FL/AC:       20.4  %    20 - 24  LV:        4.9  mm  Est. FW:     503   gm     1 lb 2 oz     51  % ---------------------------------------------------------------------- OB History  Gravidity:    3  TOP:          2  Living: 0 ---------------------------------------------------------------------- Gestational Age  LMP:           14w 0d        Date:  03/14/23                   EDD:   12/19/23  U/S Today:     22w 2d                                        EDD:   10/22/23  Best:          22w 2d     Det. By:  U/S (06/20/23)           EDD:   10/22/23 ---------------------------------------------------------------------- Anatomy  Cranium:               Appears normal         Aortic Arch:            Not well visualized  Cavum:                 Appears normal         Ductal Arch:            Not well visualized  Ventricles:            Appears normal         Diaphragm:              Appears normal  Choroid  Plexus:        Not well visualized    Stomach:                Appears normal, left                                                                        sided  Cerebellum:            Not well visualized    Abdomen:                Appears normal  Posterior Fossa:       Not well visualized    Abdominal Wall:         Appears nml (cord                                                                        insert, abd wall)  Nuchal Fold:           Not well visualized    Cord Vessels:           Not well visualized  Face:                  Profile nl; orbits not Kidneys:                Appear normal  well visualized  Lips:                  Not well visualized    Bladder:                Appears normal  Thoracic:              Appears normal         Spine:                  Not well visualized  Heart:                 Appears normal         Upper Extremities:      Not well visualized                         (4CH, axis, and                         situs)  RVOT:                  Not well visualized    Lower Extremities:      Not well visualized  LVOT:                  Not well visualized ---------------------------------------------------------------------- Cervix Uterus Adnexa  Cervix  Length:            3.7  cm.  Normal appearance by transabdominal scan  Uterus  No abnormality visualized.  Right Ovary  Not visualized.  Left Ovary  Not visualized.  Cul De Sac  No free fluid seen.  Adnexa  No adnexal mass visualized ---------------------------------------------------------------------- Impression  Single intrauterine pregnancy here for a complete anatomy  and dating due to vaginal bleeding, abdominal pain and  uncertain dates.  Normal anatomy with measurements consistent with an EDD  of 10/22/23  There is good fetal movement and amniotic fluid volume  Suboptimal views of the fetal anatomy were obtained  secondary to fetal position.  There is a large posterir fundal separation of fetal  membranes suspicious  for hemorrhage. The placental  contour appears irregularly shaped supecting possible bleed.  Placental abruption can not be ruled out however, this is a  clinical diagnosis.  The patient was in extreme pain throughtout the exam per our  sonographer. ---------------------------------------------------------------------- Recommendations  Clinical corrleation recommended. 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, sequela    Chronic hepatitis C without hepatic coma (HCC) 09/26/2020   Medication monitoring encounter 09/26/2020   Immunization counseling 01/26/2020   IV infiltrate, subsequent encounter 01/16/2020   Septic pulmonary embolism without acute cor pulmonale (HCC) 01/16/2020   Hydropneumothorax 01/16/2020   Severe tricuspid regurgitation 01/16/2020   GAD (generalized anxiety disorder) 01/16/2020   Hepatitis C, chronic (HCC) 01/16/2020   Anemia, chronic disease 01/16/2020   Protein-calorie malnutrition, severe 12/27/2019   MRSA infection 12/06/2019   Opioid use with withdrawal (HCC)    Endocarditis of tricuspid valve    Palliative care encounter  Septic shock (HCC) 11/30/2019   Septic embolism (HCC) 11/05/2019   AKI (acute kidney injury) (HCC) 11/05/2019   Hyponatremia 11/05/2019   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 -Potential transfusion. -UTI treatment -Patient informed of recommendation for admission and is agreeable, but states she does not know how long she will stay. -Reviewed reasoning for recommendation and r/b associated with large placental abruption with hemorrhage in setting of low hemoglobin including loss of pregnancy and loss of life. Further informed that leaving would be AMA. -Patient verbalizes understanding and without questions.  Kraig Peru MSN, CNM Advanced Practice Provider, [facility] [employer]

## 2023-06-20 NOTE — ED Provider Triage Note (Signed)
 Emergency Medicine Provider OB Triage Evaluation Note  Amber [name] is a 33 y.o. female, G2P0020, at Unknown gestation who presents to the emergency department with complaints of abdominal pain and vaginal bleeding.  LMP sometime before January, cycles are irregular. + home UPT many times.  Started having cramping and vaginal bleeding tonight.  Review of  Systems  Positive: abdominal pain Negative: fever  Physical Exam  BP (!) 128/101   Pulse (!) 121   Temp 97.9 F (36.6 C)   Resp (!) 24   Ht 5\' 1"  (1.549 m)   Wt 55 kg   SpO2 100%   BMI 22.91 kg/m   General: Awake, no distress, seems uncomfortable HEENT: Atraumatic  Resp: Normal effort  Cardiac: Normal rate Abd: gravid MSK: Moves all extremities without difficulty Neuro: Speech clear  Medical Decision Making  Pt evaluated for pregnancy concern and is stable for transfer to MAU. Pt is in agreement with plan for transfer.  5:13 AM Discussed with MAU, Abran Abrahams, who accepts patient in transfer.  Clinical Impression   1. Vaginal bleeding in pregnancy     Visible IUP on bedside US , seems likely 4-5 months based on size.  + cardiac activity, rate 140's.     Coretha Dew, PA-C 06/20/23 [phone]

## 2023-06-20 NOTE — MAU Note (Addendum)
 Pt says cramps started at 7pm- last night .   Also says she uses drugs- used crystal- meth at 7pm  Cramping  worse at 0100. VB started at 0100. Went to [facility] ER - transferred to MAU Was in B-room - says trying to have a BM.  In Triage - Red VB - mod

## 2023-06-22 LAB — CULTURE, OB URINE: Culture: >=100000 — AB

## 2023-06-22 LAB — GC/CHLAMYDIA PROBE AMP (~~LOC~~) NOT AT ARMC
Chlamydia: NEGATIVE
Comment: NEGATIVE
Comment: NORMAL
Neisseria Gonorrhea: NEGATIVE

## 2023-06-23 ENCOUNTER — Other Ambulatory Visit: Payer: Self-pay | Admitting: Family Medicine

## 2023-06-23 ENCOUNTER — Encounter: Payer: Self-pay | Admitting: Family Medicine

## 2023-06-23 DIAGNOSIS — O2312 Infections of bladder in pregnancy, second trimester: Secondary | ICD-10-CM

## 2023-06-23 MED ORDER — CEFADROXIL 500 MG PO CAPS: 500.0000 mg | ORAL_CAPSULE | Freq: Two times a day (BID) | ORAL | 0 refills | Status: DC

## 2023-06-23 NOTE — Progress Notes (Signed)
 Called patient and left VM to check MyChart or call provider back.  Sending prescription to treat infection with urine culture showing E. Coli as causative bacteria. Also sending MyChart message to patient informing of results and prescription.

## 2023-07-07 ENCOUNTER — Telehealth: Payer: Self-pay | Admitting: Family Medicine

## 2023-07-07 NOTE — Telephone Encounter (Signed)
 Called patient after notification of unread MyChart message regarding urine culture results. No answer, left voicemail to check MyChart or call back for information.

## 2023-09-04 ENCOUNTER — Emergency Department (HOSPITAL_COMMUNITY)
Admission: EM | Admit: 2023-09-04 | Discharge: 2023-09-04 | Disposition: A | Discharge status: Home / Self Care | Payer: MEDICAID | Attending: Emergency Medicine | Admitting: Emergency Medicine

## 2023-09-04 ENCOUNTER — Emergency Department (HOSPITAL_COMMUNITY): Payer: MEDICAID

## 2023-09-04 ENCOUNTER — Other Ambulatory Visit: Payer: Self-pay

## 2023-09-04 ENCOUNTER — Encounter (HOSPITAL_COMMUNITY): Payer: Self-pay

## 2023-09-04 DIAGNOSIS — N3000 Acute cystitis without hematuria: Secondary | ICD-10-CM | POA: Insufficient documentation

## 2023-09-04 DIAGNOSIS — I10 Essential (primary) hypertension: Secondary | ICD-10-CM | POA: Diagnosis not present

## 2023-09-04 DIAGNOSIS — M7989 Other specified soft tissue disorders: Secondary | ICD-10-CM

## 2023-09-04 DIAGNOSIS — Z7982 Long term (current) use of aspirin: Secondary | ICD-10-CM | POA: Insufficient documentation

## 2023-09-04 DIAGNOSIS — F17210 Nicotine dependence, cigarettes, uncomplicated: Secondary | ICD-10-CM | POA: Diagnosis not present

## 2023-09-04 DIAGNOSIS — L03116 Cellulitis of left lower limb: Secondary | ICD-10-CM | POA: Insufficient documentation

## 2023-09-04 DIAGNOSIS — Z79899 Other long term (current) drug therapy: Secondary | ICD-10-CM | POA: Insufficient documentation

## 2023-09-04 LAB — URINALYSIS, W/ REFLEX TO CULTURE (INFECTION SUSPECTED)
Bilirubin Urine: NEGATIVE
Glucose, UA: NEGATIVE mg/dL
Hgb urine dipstick: NEGATIVE
Ketones, ur: NEGATIVE mg/dL
Nitrite: NEGATIVE
Protein, ur: NEGATIVE mg/dL
Specific Gravity, Urine: 1.01 (ref 1.005–1.030)
WBC, UA: >50 WBC/hpf (ref 0–5)
pH: 6 (ref 5.0–8.0)

## 2023-09-04 LAB — CBC WITH DIFFERENTIAL/PLATELET
Abs Immature Granulocytes: 0.04 K/uL (ref 0.00–0.07)
Basophils Absolute: 0 K/uL (ref 0.0–0.1)
Basophils Relative: 0 %
Eosinophils Absolute: 0.3 K/uL (ref 0.0–0.5)
Eosinophils Relative: 3 %
HCT: 31.1 % — ABNORMAL LOW (ref 36.0–46.0)
Hemoglobin: 8.6 g/dL — ABNORMAL LOW (ref 12.0–15.0)
Immature Granulocytes: 0 %
Lymphocytes Relative: 21 %
Lymphs Abs: 2.7 K/uL (ref 0.7–4.0)
MCH: 19.7 pg — ABNORMAL LOW (ref 26.0–34.0)
MCHC: 27.7 g/dL — ABNORMAL LOW (ref 30.0–36.0)
MCV: 71.3 fL — ABNORMAL LOW (ref 80.0–100.0)
Monocytes Absolute: 1 K/uL (ref 0.1–1.0)
Monocytes Relative: 8 %
Neutro Abs: 8.6 K/uL — ABNORMAL HIGH (ref 1.7–7.7)
Neutrophils Relative %: 68 %
Platelets: 475 K/uL — ABNORMAL HIGH (ref 150–400)
RBC: 4.36 MIL/uL (ref 3.87–5.11)
RDW: 17.5 % — ABNORMAL HIGH (ref 11.5–15.5)
WBC: 12.7 K/uL — ABNORMAL HIGH (ref 4.0–10.5)
nRBC: 0 % (ref 0.0–0.2)

## 2023-09-04 LAB — COMPREHENSIVE METABOLIC PANEL WITH GFR
ALT: 13 U/L (ref 0–44)
AST: 19 U/L (ref 15–41)
Albumin: 3.6 g/dL (ref 3.5–5.0)
Alkaline Phosphatase: 73 U/L (ref 38–126)
Anion gap: 11 (ref 5–15)
BUN: 14 mg/dL (ref 6–20)
CO2: 25 mmol/L (ref 22–32)
Calcium: 9.4 mg/dL (ref 8.9–10.3)
Chloride: 100 mmol/L (ref 98–111)
Creatinine, Ser: 0.72 mg/dL (ref 0.44–1.00)
GFR, Estimated: >60 mL/min (ref >60)
Glucose, Bld: 89 mg/dL (ref 70–99)
Potassium: 4.1 mmol/L (ref 3.5–5.1)
Sodium: 136 mmol/L (ref 135–145)
Total Bilirubin: 0.6 mg/dL (ref 0.0–1.2)
Total Protein: 9.8 g/dL — ABNORMAL HIGH (ref 6.5–8.1)

## 2023-09-04 LAB — HCG, SERUM, QUALITATIVE: Preg, Serum: NEGATIVE

## 2023-09-04 LAB — BRAIN NATRIURETIC PEPTIDE: B Natriuretic Peptide: 84.8 pg/mL (ref 0.0–100.0)

## 2023-09-04 MED ORDER — CEPHALEXIN 500 MG PO CAPS: 500.0000 mg | ORAL_CAPSULE | Freq: Four times a day (QID) | ORAL | 0 refills | Status: DC

## 2023-09-04 NOTE — Progress Notes (Signed)
 VASCULAR LAB    Bilateral lower extremity venous duplex has been performed.  See CV proc for preliminary results.  Gave verbal report to Dr. Francesca LIS, [facility], RVT 09/04/2023, 6:47 PM

## 2023-09-04 NOTE — ED Triage Notes (Addendum)
 Pt reports bilateral leg swelling starting 2 days ago. Tender to touch, non pitting. Endocarditis in 2023. No known trigger. Fentanyl  use in the last few days

## 2023-09-04 NOTE — ED Notes (Signed)
 Amber [name], Diplomatic Services operational officer contacted [employer] to make sure this pt could have this done today.  She is a [facility] and will be here after she finishes.

## 2023-09-04 NOTE — ED Provider Notes (Signed)
 [employer] EMERGENCY DEPARTMENT AT [facility] Provider Note  CSN: [id number] Arrival date & time: 09/04/23 1149  Chief Complaint(s) Leg Swelling  HPI Amber [name] is a 33 y.o. female history of IV drug abuse, hepatitis C, prior endocarditis presenting to the emergency department with foot swelling.  She reports swelling to both feet, worse in the left foot.  Reports some redness in the left foot.  No wound.  Does not inject in the foot.  Does still use IV drugs.  Denies any fevers or chills, lightheadedness or dizziness.  She also reports she has had some foul-smelling urine but no dysuria, flank pain, abdominal pain, chest pain, difficulty breathing, or any other new symptoms.  This began a couple of days ago.  No trauma.   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, sequela    Chronic hepatitis C without hepatic coma (HCC) 09/26/2020   Medication monitoring encounter 09/26/2020   Immunization counseling 01/26/2020   IV infiltrate, subsequent encounter 01/16/2020   Septic pulmonary embolism without acute cor pulmonale (HCC) 01/16/2020   Hydropneumothorax 01/16/2020   Severe tricuspid regurgitation 01/16/2020   GAD (generalized anxiety disorder) 01/16/2020   Hepatitis C, chronic (HCC) 01/16/2020   Anemia, chronic disease 01/16/2020   Protein-calorie malnutrition, severe  12/27/2019   MRSA infection 12/06/2019   Opioid use with withdrawal (HCC)    Endocarditis of tricuspid valve    Palliative care encounter    Septic shock (HCC) 11/30/2019   Septic embolism (HCC) 11/05/2019   AKI (acute kidney injury) (HCC) 11/05/2019   Hyponatremia 11/05/2019   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   Bacterial endocarditis 11/05/2019   Acute bacterial endocarditis    MRSA bacteremia    Sepsis (HCC) 11/03/2019   Tenosynovitis of ankle 07/21/2019   Cellulitis and abscess of left leg 07/19/2019   Abscess of face 08/20/2018   Facial cellulitis 11/21/2017   Depression    IV drug abuse (HCC)    Suicidal ideation    Home Medication(s) Prior to Admission medications   Medication Sig Start Date End Date Taking? Authorizing Provider  cephALEXin  (KEFLEX ) 500 MG capsule Take 1 capsule (500 mg total) by mouth 4 (four) times daily. 09/04/23  Yes Francesca Elsie LITTIE, MD  acetaminophen  (TYLENOL ) 500 MG tablet Take 2 tablets (1,000 mg total) by mouth every 6 (six) hours as needed for headache or mild pain. 01/21/21   Barrett, Rocky SAUNDERS, PA-C  aspirin  EC 81 MG tablet Take 1 tablet (81 mg total) by mouth daily. Swallow whole. 11/18/21   Milford, Harlene HERO, FNP  dicyclomine  (BENTYL ) 20 MG tablet Take 1 tablet (20 mg total) by mouth 2 (two) times daily as needed (stomach cramping or pain). 11/24/21   Geiple, Joshua, PA-C  escitalopram  (LEXAPRO ) 20 MG  tablet Take 1 tablet (20 mg total) by mouth at bedtime. 01/11/20   Alto Isaiah CROME, NP  gabapentin  (NEURONTIN ) 300 MG capsule Take by mouth. 02/28/22   [provider]  lidocaine  (XYLOCAINE ) 2 % solution Use as directed 15 mLs in the mouth or throat every 3 (three) hours as needed for mouth pain (swish and spit). 03/24/22   Arvis Jolan NOVAK, PA-C  omeprazole  (PRILOSEC) 20 MG capsule Take 1 capsule (20 mg total) by mouth daily. 11/24/21   Geiple, Joshua, PA-C  promethazine -dextromethorphan (PROMETHAZINE -DM) 6.25-15 MG/5ML syrup Take 5 mLs by  mouth 4 (four) times daily as needed. 03/24/22   Arvis Jolan NOVAK DEVONNA                                                                                                                                    Past Surgical History Past Surgical History:  Procedure Laterality Date   APPLICATION OF ANGIOVAC N/A 12/05/2019   Procedure: APPLICATION OF ANGIOVAC;  Surgeon: Shyrl Linnie KIDD, MD;  Location: [facility] OR;  Service: Vascular;  Laterality: N/A;  Patient will need intraoperative TEE Can be performed in OR 14, 15, or 17 if OR 16 is not available   CARDIAC CATHETERIZATION     HARDWARE REMOVAL Right 09/28/2020   Procedure: REMOVAL DEEP HARDWARE RIGHT TIBIA;  Surgeon: Harden Jerona GAILS, MD;  Location: [facility] OR;  Service: Orthopedics;  Laterality: Right;   RIGHT HEART CATH N/A 09/25/2020   Procedure: RIGHT HEART CATH;  Surgeon: Rolan Ezra RAMAN, MD;  Location: [facility] INVASIVE CV LAB;  Service: Cardiovascular;  Laterality: N/A;   right tibia Right    shattered knee and had to have hardware placed   TEE WITHOUT CARDIOVERSION N/A 11/07/2019   Procedure: TRANSESOPHAGEAL ECHOCARDIOGRAM (TEE);  Surgeon: Delford Maude BROCKS, MD;  Location: [facility] ENDOSCOPY;  Service: Cardiovascular;  Laterality: N/A;   TEE WITHOUT CARDIOVERSION  12/05/2019   Procedure: TRANSESOPHAGEAL ECHOCARDIOGRAM (TEE);  Surgeon: Shyrl Linnie KIDD, MD;  Location: [facility] OR;  Service: Vascular;;   TEE WITHOUT CARDIOVERSION N/A 12/05/2019   Procedure: TRANSESOPHAGEAL ECHOCARDIOGRAM (TEE);  Surgeon: Shyrl Linnie KIDD, MD;  Location: [facility] OR;  Service: Thoracic;  Laterality: N/A;   TEE WITHOUT CARDIOVERSION N/A 01/17/2021   Procedure: TRANSESOPHAGEAL ECHOCARDIOGRAM (TEE);  Surgeon: Shyrl Linnie KIDD, MD;  Location: [facility] OR;  Service: Open Heart Surgery;  Laterality: N/A;   TRICUSPID VALVE REPLACEMENT N/A 01/17/2021   Procedure: TRICUSPID VALVE REPAIR USING EDWARDS MC3 28 TRICUSPID RING;  Surgeon: Shyrl Linnie KIDD, MD;  Location: [facility] OR;  Service: Open Heart  Surgery;  Laterality: N/A;   Family History Family History  Problem Relation Age of Onset   Valvular heart disease Maternal Grandmother    COPD Maternal Grandfather     Social History Social History   Tobacco Use   Smoking status: Every Day    Current packs/day: 0.00    Types: E-cigarettes, Cigarettes    Last  attempt to quit: 11/14/2020    Years since quitting: 2.8   Smokeless tobacco: Never  Vaping Use   Vaping status: Every Day   Start date: 11/15/2020   Substances: Nicotine , Flavoring  Substance Use Topics   Alcohol use: No   Drug use: Yes    Types: Marijuana    Comment: fentanyl    Allergies Bee venom and Other  Review of Systems Review of Systems  All other systems reviewed and are negative.   Physical Exam Vital Signs  I have reviewed the triage vital signs BP 123/64   Pulse 88   Temp 98.5 F (36.9 C) (Oral)   Resp 16   Ht 5' 1 (1.549 m)   Wt 49.9 kg   LMP 03/14/2023   SpO2 100%   Breastfeeding Unknown   BMI 20.78 kg/m  Physical Exam Vitals and nursing note reviewed.  Constitutional:      General: She is not in acute distress.    Appearance: She is well-developed.  HENT:     Head: Normocephalic and atraumatic.     Mouth/Throat:     Mouth: Mucous membranes are moist.  Eyes:     Pupils: Pupils are equal, round, and reactive to light.  Cardiovascular:     Rate and Rhythm: Normal rate and regular rhythm.     Heart sounds: No murmur heard. Pulmonary:     Effort: Pulmonary effort is normal. No respiratory distress.     Breath sounds: Normal breath sounds.  Abdominal:     General: Abdomen is flat.     Palpations: Abdomen is soft.     Tenderness: There is no abdominal tenderness.  Musculoskeletal:        General: No tenderness.     Comments: Mild bilateral pedal edema not extending up to the remainder of the lower extremity bilaterally  Skin:    General: Skin is warm and dry.     Comments: Scattered track marks to the bilateral AC and upper  extremity.  Mild erythema to dorsal left foot at the distal aspect with some warmth.  No fluctuance, wound over the feet.  No sign of any injection marks in the feet.  Neurological:     General: No focal deficit present.     Mental Status: She is alert. Mental status is at baseline.  Psychiatric:        Mood and Affect: Mood normal.        Behavior: Behavior normal.     ED Results and Treatments Labs (all labs ordered are listed, but only abnormal results are displayed) Labs Reviewed  COMPREHENSIVE METABOLIC PANEL WITH GFR - Abnormal; Notable for the following components:      Result Value   Total Protein 9.8 (*)    All other components within normal limits  CBC WITH DIFFERENTIAL/PLATELET - Abnormal; Notable for the following components:   WBC 12.7 (*)    Hemoglobin 8.6 (*)    HCT 31.1 (*)    MCV 71.3 (*)    MCH 19.7 (*)    MCHC 27.7 (*)    RDW 17.5 (*)    Platelets 475 (*)    Neutro Abs 8.6 (*)    All other components within normal limits  URINALYSIS, W/ REFLEX TO CULTURE (INFECTION SUSPECTED) - Abnormal; Notable for the following components:   APPearance CLOUDY (*)    Leukocytes,Ua LARGE (*)    Bacteria, UA MANY (*)    All other components within normal limits  HCG, SERUM, QUALITATIVE  BRAIN NATRIURETIC PEPTIDE                                                                                                                          Radiology VAS US  LOWER EXTREMITY VENOUS (DVT) (7a-7p) Result Date: 09/04/2023  Lower Venous DVT Study Patient Name:  Amber [name]  Date of Exam:   09/04/2023 Medical Rec #: [id number]         Accession #:    [id number] Date of Birth: 09/24/1990          Patient Gender: F Patient Age:   30 years Exam Location:  [facility] Procedure:      VAS US  LOWER EXTREMITY VENOUS (DVT) Referring Phys: ELSIE BODY --------------------------------------------------------------------------------  Indications: Pain, Swelling, and Erythema.  Risk  Factors: Ongoing drug abuse. History of tricuspid valve repair secondary to endocarditis 01/17/2021, removal of deep hardware from right tibia secondary to infection 09/28/20. Comparison Study: No prior study on file Performing Technologist: Alberta Lis RVS  Examination Guidelines: A complete evaluation includes B-mode imaging, spectral Doppler, color Doppler, and power Doppler as needed of all accessible portions of each vessel. Bilateral testing is considered an integral part of a complete examination. Limited examinations for reoccurring indications may be performed as noted. The reflux portion of the exam is performed with the patient in reverse Trendelenburg.  +---------+---------------+---------+-----------+----------+--------------+ RIGHT    CompressibilityPhasicitySpontaneityPropertiesThrombus Aging +---------+---------------+---------+-----------+----------+--------------+ CFV      Full           No       No                                  +---------+---------------+---------+-----------+----------+--------------+ SFJ      Full                                                        +---------+---------------+---------+-----------+----------+--------------+ FV Prox  Full                                                        +---------+---------------+---------+-----------+----------+--------------+ FV Mid   Full                                                        +---------+---------------+---------+-----------+----------+--------------+ FV DistalFull                                                        +---------+---------------+---------+-----------+----------+--------------+  PFV      Full                                                        +---------+---------------+---------+-----------+----------+--------------+ POP      Full           No       Yes                                  +---------+---------------+---------+-----------+----------+--------------+ PTV      Full                                                        +---------+---------------+---------+-----------+----------+--------------+ PERO     Full                                                        +---------+---------------+---------+-----------+----------+--------------+ Gastroc  Full                                                        +---------+---------------+---------+-----------+----------+--------------+   +---------+---------------+---------+-----------+----------+--------------+ LEFT     CompressibilityPhasicitySpontaneityPropertiesThrombus Aging +---------+---------------+---------+-----------+----------+--------------+ CFV      Full           No       No                                  +---------+---------------+---------+-----------+----------+--------------+ SFJ      Full                                                        +---------+---------------+---------+-----------+----------+--------------+ FV Prox  Full                                                        +---------+---------------+---------+-----------+----------+--------------+ FV Mid   Full                                                        +---------+---------------+---------+-----------+----------+--------------+ FV DistalFull                                                        +---------+---------------+---------+-----------+----------+--------------+  PFV      Full                                                        +---------+---------------+---------+-----------+----------+--------------+ POP      Full           No       Yes                                 +---------+---------------+---------+-----------+----------+--------------+ PTV      Full                                                         +---------+---------------+---------+-----------+----------+--------------+ PERO     Full                                                        +---------+---------------+---------+-----------+----------+--------------+ Gastroc  Full           No       Yes                                 +---------+---------------+---------+-----------+----------+--------------+    Summary: RIGHT: - There is no evidence of deep vein thrombosis in the lower extremity.  - No cystic structure found in the popliteal fossa. - Ultrasound characteristics of enlarged lymph nodes are noted in the groin.  LEFT: - There is no evidence of deep vein thrombosis in the lower extremity.  - No cystic structure found in the popliteal fossa. - Ultrasound characteristics of enlarged lymph nodes noted in the groin.  *See table(s) above for measurements and observations.    Preliminary    DG Chest 1 View Result Date: 09/04/2023 CLINICAL DATA:  855384 Pain 855384 EXAM: CHEST  1 VIEW COMPARISON:  October 22, 2021 FINDINGS: No focal airspace consolidation, pleural effusion, or pneumothorax. No cardiomegaly.Sternotomy wires and tricuspid annuloplasty ring.No acute fracture or destructive lesion. IMPRESSION: No acute cardiopulmonary abnormality. Electronically Signed   By: Rogelia Myers M.D.   On: 09/04/2023 17:41   DG Foot Complete Right Result Date: 09/04/2023 CLINICAL DATA:  pain swelling EXAM: RIGHT FOOT COMPLETE - 3+ VIEW COMPARISON:  June 29, 2011 FINDINGS: No acute fracture or dislocation. There is no evidence of arthropathy or other focal bone abnormality. Mild soft tissue swelling about the dorsum of the foot. No radiopaque foreign body. IMPRESSION: Mild dorsal soft tissue swelling of the foot. No acute fracture or dislocation. Electronically Signed   By: Rogelia Myers M.D.   On: 09/04/2023 17:36   DG Foot Complete Left Result Date: 09/04/2023 CLINICAL DATA:  pain swelling EXAM: LEFT FOOT - COMPLETE 3+ VIEW COMPARISON:   November 04, 2019 FINDINGS: No acute fracture or dislocation. There is no evidence of arthropathy or other focal bone abnormality. Moderate soft tissue swelling about the dorsum of the foot. No radiopaque foreign body. IMPRESSION: Moderate  dorsal foot swelling.  No acute fracture or dislocation. Electronically Signed   By: Rogelia Myers M.D.   On: 09/04/2023 17:31    Pertinent labs & imaging results that were available during my care of the patient were reviewed by me and considered in my medical decision making (see MDM for details).  Medications Ordered in ED Medications - No data to display                                                                                                                                   Procedures Procedures  (including critical care time)  Medical Decision Making / ED Course   MDM:  33 year old presenting to the emergency department with foot pain, bilaterally but worse on the left.  Exam with some erythema, warmth over the dorsal left foot, mild swelling to the right foot as well.  Seems most consistent with cellulitis.  Considered DVT, obtained ultrasound which was negative.  No wound, fluctuance, reported injection in the area to suggest abscess.  Considered CHF, patient was recently peripartum, BNP normal, chest x-ray clear and lungs are clear on exam, seems less consistent with CHF.  Albumin  is reassuring.  UA does show signs of UTI.  Will prescribe Keflex  which will cover for both.  No evidence of pyelonephritis.  Considered other process such as sepsis, patient afebrile, denies fevers at home, hemodynamically stable, normal blood pressure.  Feel patient is stable for discharge.  Recommended strict return precautions for any fevers.  Will discharge patient to home. All questions answered. Patient comfortable with plan of discharge. Return precautions discussed with patient and specified on the after visit summary.       Additional history  obtained:  -External records from outside source obtained and reviewed including: Chart review including previous notes, labs, imaging, consultation notes including recent hospitalization for pregnancy   Lab Tests: -I ordered, reviewed, and interpreted labs.   The pertinent results include:   Labs Reviewed  COMPREHENSIVE METABOLIC PANEL WITH GFR - Abnormal; Notable for the following components:      Result Value   Total Protein 9.8 (*)    All other components within normal limits  CBC WITH DIFFERENTIAL/PLATELET - Abnormal; Notable for the following components:   WBC 12.7 (*)    Hemoglobin 8.6 (*)    HCT 31.1 (*)    MCV 71.3 (*)    MCH 19.7 (*)    MCHC 27.7 (*)    RDW 17.5 (*)    Platelets 475 (*)    Neutro Abs 8.6 (*)    All other components within normal limits  URINALYSIS, W/ REFLEX TO CULTURE (INFECTION SUSPECTED) - Abnormal; Notable for the following components:   APPearance CLOUDY (*)    Leukocytes,Ua LARGE (*)    Bacteria, UA MANY (*)    All other components within normal limits  HCG, SERUM, QUALITATIVE  BRAIN NATRIURETIC PEPTIDE    Notable for  negative pregnancy, +UTI, mild leukocytosis    Imaging Studies ordered: I ordered imaging studies including DVT US , XR foot, CXR On my interpretation imaging demonstrates no acute process I independently visualized and interpreted imaging. I agree with the radiologist interpretation   Medicines ordered and prescription drug management: Meds ordered this encounter  Medications   cephALEXin  (KEFLEX ) 500 MG capsule    Sig: Take 1 capsule (500 mg total) by mouth 4 (four) times daily.    Dispense:  28 capsule    Refill:  0    -I have reviewed the patients home medicines and have made adjustments as needed   Social Determinants of Health:  Diagnosis or treatment significantly limited by social determinants of health: polysubstance abuse   Reevaluation: After the interventions noted above, I reevaluated the patient and  found that their symptoms have improved  Co morbidities that complicate the patient evaluation  Past Medical History:  Diagnosis Date   Anxiety    Complication of anesthesia    Hard to wake up   Depression    Endocarditis    Hepatitis    Hep C- pt completed medication   Heroin abuse (HCC)    History of kidney stones    Hypertension    Miscarriage    MRSA (methicillin resistant staph aureus) culture positive    Ovarian cyst    UTI (lower urinary tract infection)       Dispostion: Disposition decision including need for hospitalization was considered, and patient discharged from emergency department.    Final Clinical Impression(s) / ED Diagnoses Final diagnoses:  Cellulitis of left lower extremity  Acute cystitis without hematuria     This chart was dictated using voice recognition software.  Despite best efforts to proofread,  errors can occur which can change the documentation meaning.    Francesca Elsie CROME, MD 09/04/23 2020

## 2023-09-04 NOTE — Discharge Instructions (Addendum)
 We evaluated you for your foot swelling.  Your laboratory testing was reassuring.  Your ultrasound did not show signs of a blood clot.  We suspect that you might have a skin infection in your foot.  We have prescribed you antibiotics.  We also saw signs of a urine infection, and the antibiotics we have prescribed you for your skin infection we will also treat your urine infection.  Please try to keep your legs elevated is much as possible to help with swelling.  You can take 1000 mg of Tylenol  every 6 hours as needed for pain.  Please follow-up with your primary doctor.  Please return if you have any new or worsening symptoms such as fevers, lightheadedness or dizziness, weakness, chest pain, shortness of breath, or any other new symptoms.

## 2023-09-04 NOTE — ED Notes (Signed)
 Stuck pt 3 times, all 3 times were unsuccessful.

## 2024-01-28 ENCOUNTER — Emergency Department (HOSPITAL_COMMUNITY)
Admission: EM | Admit: 2024-01-28 | Discharge: 2024-01-28 | Disposition: A | Discharge status: Home / Self Care | Payer: MEDICAID | Attending: Emergency Medicine | Admitting: Emergency Medicine

## 2024-01-28 ENCOUNTER — Encounter (HOSPITAL_COMMUNITY): Payer: Self-pay

## 2024-01-28 ENCOUNTER — Other Ambulatory Visit: Payer: Self-pay

## 2024-01-28 ENCOUNTER — Emergency Department (HOSPITAL_COMMUNITY): Payer: MEDICAID

## 2024-01-28 ENCOUNTER — Emergency Department (HOSPITAL_COMMUNITY)
Admission: EM | Admit: 2024-01-28 | Discharge: 2024-01-29 | Disposition: A | Discharge status: Home / Self Care | Payer: MEDICAID | Attending: Emergency Medicine | Admitting: Emergency Medicine

## 2024-01-28 DIAGNOSIS — X31XXXA Exposure to excessive natural cold, initial encounter: Secondary | ICD-10-CM | POA: Insufficient documentation

## 2024-01-28 DIAGNOSIS — Z7982 Long term (current) use of aspirin: Secondary | ICD-10-CM | POA: Insufficient documentation

## 2024-01-28 DIAGNOSIS — N3001 Acute cystitis with hematuria: Secondary | ICD-10-CM | POA: Insufficient documentation

## 2024-01-28 DIAGNOSIS — K5900 Constipation, unspecified: Secondary | ICD-10-CM | POA: Insufficient documentation

## 2024-01-28 DIAGNOSIS — T699XXA Effect of reduced temperature, unspecified, initial encounter: Secondary | ICD-10-CM | POA: Insufficient documentation

## 2024-01-28 DIAGNOSIS — M791 Myalgia, unspecified site: Secondary | ICD-10-CM | POA: Insufficient documentation

## 2024-01-28 DIAGNOSIS — R1030 Lower abdominal pain, unspecified: Secondary | ICD-10-CM

## 2024-01-28 LAB — URINALYSIS, ROUTINE W REFLEX MICROSCOPIC
Bilirubin Urine: NEGATIVE
Glucose, UA: NEGATIVE mg/dL
Hgb urine dipstick: NEGATIVE
Ketones, ur: NEGATIVE mg/dL
Nitrite: POSITIVE — AB
Protein, ur: NEGATIVE mg/dL
Specific Gravity, Urine: 1.015 (ref 1.005–1.030)
WBC, UA: >50 WBC/hpf (ref 0–5)
pH: 7 (ref 5.0–8.0)

## 2024-01-28 LAB — COMPREHENSIVE METABOLIC PANEL WITH GFR
ALT: 19 U/L (ref 0–44)
AST: 35 U/L (ref 15–41)
Albumin: 4.4 g/dL (ref 3.5–5.0)
Alkaline Phosphatase: 96 U/L (ref 38–126)
Anion gap: 13 (ref 5–15)
BUN: 15 mg/dL (ref 6–20)
CO2: 23 mmol/L (ref 22–32)
Calcium: 10.4 mg/dL — ABNORMAL HIGH (ref 8.9–10.3)
Chloride: 100 mmol/L (ref 98–111)
Creatinine, Ser: 0.77 mg/dL (ref 0.44–1.00)
GFR, Estimated: >60 mL/min
Glucose, Bld: 97 mg/dL (ref 70–99)
Potassium: 3.8 mmol/L (ref 3.5–5.1)
Sodium: 137 mmol/L (ref 135–145)
Total Bilirubin: 0.7 mg/dL (ref 0.0–1.2)
Total Protein: 8.9 g/dL — ABNORMAL HIGH (ref 6.5–8.1)

## 2024-01-28 LAB — LIPASE, BLOOD: Lipase: 31 U/L (ref 11–51)

## 2024-01-28 LAB — CBC
HCT: 44 % (ref 36.0–46.0)
Hemoglobin: 13 g/dL (ref 12.0–15.0)
MCH: 19.8 pg — ABNORMAL LOW (ref 26.0–34.0)
MCHC: 29.5 g/dL — ABNORMAL LOW (ref 30.0–36.0)
MCV: 67 fL — ABNORMAL LOW (ref 80.0–100.0)
Platelets: 325 K/uL (ref 150–400)
RBC: 6.57 MIL/uL — ABNORMAL HIGH (ref 3.87–5.11)
RDW: 20.4 % — ABNORMAL HIGH (ref 11.5–15.5)
WBC: 9.8 K/uL (ref 4.0–10.5)
nRBC: 0 % (ref 0.0–0.2)

## 2024-01-28 LAB — HCG, SERUM, QUALITATIVE: Preg, Serum: NEGATIVE

## 2024-01-28 MED ORDER — SODIUM CHLORIDE 0.9 % IV SOLN
2.0000 g | Freq: Once | INTRAVENOUS | Status: AC
  Administered 2024-01-28: 2 g via INTRAVENOUS
  Filled 2024-01-28: qty 20

## 2024-01-28 MED ORDER — IOHEXOL 300 MG/ML  SOLN
100.0000 mL | Freq: Once | INTRAMUSCULAR | Status: AC | PRN
  Administered 2024-01-28: 100 mL via INTRAVENOUS

## 2024-01-28 MED ORDER — CEPHALEXIN 500 MG PO CAPS: 500.0000 mg | ORAL_CAPSULE | Freq: Once | ORAL | Status: DC

## 2024-01-28 MED ORDER — CEPHALEXIN 500 MG PO CAPS: 500.0000 mg | ORAL_CAPSULE | Freq: Four times a day (QID) | ORAL | 0 refills | Status: AC

## 2024-01-28 NOTE — ED Triage Notes (Addendum)
 [place] [facility] [place]. Seen earlier today seeking warm shelter.   Pt does not verbalize any complaint or participate in triage.   Verbalized abdominal discomfort and a generalized not feeling good to EMS.

## 2024-01-28 NOTE — Discharge Instructions (Signed)
 SABRA

## 2024-01-28 NOTE — ED Notes (Signed)
 Pt will ambulate with a steady gait (and without assistance) when encouraged to do so.   Pt ambulated to bathroom and provided a urine sample.

## 2024-01-28 NOTE — ED Triage Notes (Signed)
 Pt too cold outside, sleeping on ground. Wanted to go to warming shelter but EMS could not find somewhere open for her. Pt wants to sleep in the warmth for a few hours.

## 2024-01-28 NOTE — ED Provider Notes (Signed)
 " [place] EMERGENCY DEPARTMENT AT [facility] Provider Note   CSN: [id number] Arrival date & time: 01/28/24  8084     Patient presents with: Abdominal Pain   Amber [name] is a 34 y.o. female.   Patient has a history of IV drug use.  Patient complains of lower abdominal pain.  No fever no chills  The history is provided by the patient and medical records. No language interpreter was used.  Abdominal Pain Pain location:  Suprapubic Pain quality: aching   Pain radiates to:  Does not radiate Pain severity:  Moderate Onset quality:  Sudden Timing:  Constant Progression:  Worsening Chronicity:  New Context: not alcohol use   Relieved by:  Nothing Worsened by:  Nothing Associated symptoms: no chest pain, no cough, no diarrhea, no fatigue and no hematuria        Prior to Admission medications  Medication Sig Start Date End Date Taking? Authorizing Provider  acetaminophen  (TYLENOL ) 500 MG tablet Take 2 tablets (1,000 mg total) by mouth every 6 (six) hours as needed for headache or mild pain. 01/21/21   Barrett, Rocky SAUNDERS, PA-C  aspirin  EC 81 MG tablet Take 1 tablet (81 mg total) by mouth daily. Swallow whole. 11/18/21   Milford, Harlene HERO, FNP  cephALEXin  (KEFLEX ) 500 MG capsule Take 1 capsule (500 mg total) by mouth 4 (four) times daily. 09/04/23   Francesca Elsie LITTIE, MD  dicyclomine  (BENTYL ) 20 MG tablet Take 1 tablet (20 mg total) by mouth 2 (two) times daily as needed (stomach cramping or pain). 11/24/21   Geiple, Joshua, PA-C  escitalopram  (LEXAPRO ) 20 MG tablet Take 1 tablet (20 mg total) by mouth at bedtime. 01/11/20   Alto Isaiah LITTIE, NP  gabapentin  (NEURONTIN ) 300 MG capsule Take by mouth. 02/28/22   [provider]  lidocaine  (XYLOCAINE ) 2 % solution Use as directed 15 mLs in the mouth or throat every 3 (three) hours as needed for mouth pain (swish and spit). 03/24/22   Arvis Jolan NOVAK, PA-C  omeprazole  (PRILOSEC) 20 MG capsule Take 1 capsule (20 mg  total) by mouth daily. 11/24/21   Geiple, Joshua, PA-C  promethazine -dextromethorphan (PROMETHAZINE -DM) 6.25-15 MG/5ML syrup Take 5 mLs by mouth 4 (four) times daily as needed. 03/24/22   Arvis Jolan NOVAK, PA-C    Allergies: Bee venom and Other    Review of Systems  Constitutional:  Negative for appetite change and fatigue.  HENT:  Negative for congestion, ear discharge and sinus pressure.   Eyes:  Negative for discharge.  Respiratory:  Negative for cough.   Cardiovascular:  Negative for chest pain.  Gastrointestinal:  Positive for abdominal pain. Negative for diarrhea.  Genitourinary:  Negative for frequency and hematuria.  Musculoskeletal:  Negative for back pain.  Skin:  Negative for rash.  Neurological:  Negative for seizures and headaches.  Psychiatric/Behavioral:  Negative for hallucinations.     Updated Vital Signs BP (!) 154/104   Pulse 60   Temp 98 F (36.7 C)   Resp 15   SpO2 99%   Physical Exam Vitals and nursing note reviewed.  Constitutional:      Appearance: She is well-developed.     Comments: Lethargic  HENT:     Head: Normocephalic.     Nose: Nose normal.     Mouth/Throat:     Mouth: Mucous membranes are moist.  Eyes:     General: No scleral icterus.    Conjunctiva/sclera: Conjunctivae normal.  Neck:  Thyroid: No thyromegaly.  Cardiovascular:     Rate and Rhythm: Normal rate and regular rhythm.     Heart sounds: No murmur heard.    No friction rub. No gallop.  Pulmonary:     Breath sounds: No stridor. No wheezing or rales.  Chest:     Chest wall: No tenderness.  Abdominal:     General: There is no distension.     Tenderness: There is abdominal tenderness. There is no rebound.  Musculoskeletal:        General: Normal range of motion.     Cervical back: Neck supple.  Lymphadenopathy:     Cervical: No cervical adenopathy.  Skin:    Findings: No erythema or rash.  Neurological:     Mental Status: She is oriented to person, place, and time.      Motor: No abnormal muscle tone.     Coordination: Coordination normal.  Psychiatric:        Behavior: Behavior normal.     (all labs ordered are listed, but only abnormal results are displayed) Labs Reviewed  COMPREHENSIVE METABOLIC PANEL WITH GFR - Abnormal; Notable for the following components:      Result Value   Calcium  10.4 (*)    Total Protein 8.9 (*)    All other components within normal limits  CBC - Abnormal; Notable for the following components:   RBC 6.57 (*)    MCV 67.0 (*)    MCH 19.8 (*)    MCHC 29.5 (*)    RDW 20.4 (*)    All other components within normal limits  URINALYSIS, ROUTINE W REFLEX MICROSCOPIC - Abnormal; Notable for the following components:   APPearance CLOUDY (*)    Nitrite POSITIVE (*)    Leukocytes,Ua LARGE (*)    Bacteria, UA MANY (*)    All other components within normal limits  URINE CULTURE  LIPASE, BLOOD  HCG, SERUM, QUALITATIVE    EKG: None  Radiology: No results found.   Procedures   Medications Ordered in the ED  cephALEXin  (KEFLEX ) capsule 500 mg (has no administration in time range)                                    Medical Decision Making Amount and/or Complexity of Data Reviewed Labs: ordered. Radiology: ordered.  Risk OTC drugs. Prescription drug management.  Abdominal pain with urinary tract infection.  Patient will be placed on Keflex .  Urine culture was done.  CT scan of abdomen pending.  Disposition will be determined by my colleague after the CT scan and final     Final diagnoses:  None    ED Discharge Orders     None          Suzette Pac, MD 01/31/24 1038  "

## 2024-01-28 NOTE — ED Provider Notes (Signed)
" °  Provider Note MRN:  [id number]  Arrival date & time: 01/29/24    ED Course and Medical Decision Making  Assumed care of patient at sign-out or upon transfer.  Abdominal pain awaiting CT imaging.  Evidence of UTI.  Candidate for discharge.  12:18 AM update: Patient resting comfortably, wakes easily, reassuring vital signs, answering questions appropriately.  CT supporting the notion of UTI, no other emergent findings.  Some evidence of constipation as well.  No indication for further testing or admission, plan is for discharge with antibiotics.  Procedures  Final Clinical Impressions(s) / ED Diagnoses     ICD-10-CM   1. Lower abdominal pain  R10.30     2. Acute cystitis with hematuria  N30.01     3. Constipation, unspecified constipation type  K59.00       ED Discharge Orders          Ordered    polyethylene glycol (MIRALAX ) 17 g packet  Daily        01/29/24 0012    cephALEXin  (KEFLEX ) 500 MG capsule  4 times daily        01/28/24 2319              Discharge Instructions      You were evaluated in the Emergency Department and after careful evaluation, we did not find any emergent condition requiring admission or further testing in the hospital.  Your exam/testing today is overall reassuring.  Symptoms may be due to a urinary tract infection.  Take the Keflex  antibiotic as directed.  Constipation may also be contributing to your discomfort.  Recommend use of the MiraLAX  daily.  Please return to the Emergency Department if you experience any worsening of your condition.   Thank you for allowing us  to be a part of your care.      Ozell HERO. Theadore, MD [facility] [email or web address]    Theadore Ozell HERO, MD 01/29/24 [phone]  "

## 2024-01-28 NOTE — ED Provider Notes (Signed)
 " [place] EMERGENCY DEPARTMENT AT [facility] Provider Note   CSN: [id number] Arrival date & time: 01/28/24  1143     Patient presents with: Cold Exposure   Amber [name] is a 34 y.o. female.  Patient is a 34 year old female with a history of depression, IV drug abuse, homelessness who presents to the ED via EMS for cold exposure.  Patient states she has been outside all night.  EMS was called for patient sleeping on a bench by [employer].  She states she is cold and has bodyaches everywhere but no specific complaints.  She did want to go to a warming shelter but EMS could not find somewhere open so was sent here instead.  Patient requesting to stay inside for a few hours.   HPI     Prior to Admission medications  Medication Sig Start Date End Date Taking? Authorizing Provider  acetaminophen  (TYLENOL ) 500 MG tablet Take 2 tablets (1,000 mg total) by mouth every 6 (six) hours as needed for headache or mild pain. 01/21/21   Barrett, Rocky SAUNDERS, PA-C  aspirin  EC 81 MG tablet Take 1 tablet (81 mg total) by mouth daily. Swallow whole. 11/18/21   Milford, Harlene HERO, FNP  cephALEXin  (KEFLEX ) 500 MG capsule Take 1 capsule (500 mg total) by mouth 4 (four) times daily. 09/04/23   Francesca Elsie LITTIE, MD  dicyclomine  (BENTYL ) 20 MG tablet Take 1 tablet (20 mg total) by mouth 2 (two) times daily as needed (stomach cramping or pain). 11/24/21   Geiple, Joshua, PA-C  escitalopram  (LEXAPRO ) 20 MG tablet Take 1 tablet (20 mg total) by mouth at bedtime. 01/11/20   Alto Isaiah LITTIE, NP  gabapentin  (NEURONTIN ) 300 MG capsule Take by mouth. 02/28/22   [provider]  lidocaine  (XYLOCAINE ) 2 % solution Use as directed 15 mLs in the mouth or throat every 3 (three) hours as needed for mouth pain (swish and spit). 03/24/22   Arvis Jolan NOVAK, PA-C  omeprazole  (PRILOSEC) 20 MG capsule Take 1 capsule (20 mg total) by mouth daily. 11/24/21   Geiple, Joshua, PA-C  promethazine -dextromethorphan  (PROMETHAZINE -DM) 6.25-15 MG/5ML syrup Take 5 mLs by mouth 4 (four) times daily as needed. 03/24/22   Arvis Jolan NOVAK, PA-C    Allergies: Bee venom and Other    Review of Systems  Constitutional:  Negative for fever.  Respiratory:  Negative for shortness of breath.   Cardiovascular:  Negative for chest pain.  All other systems reviewed and are negative.   Updated Vital Signs BP 137/89 (BP Location: Left Arm)   Pulse 60   Temp 97.9 F (36.6 C) (Oral)   Resp 16   SpO2 100%   Physical Exam HENT:     Head: Normocephalic and atraumatic.     Mouth/Throat:     Mouth: Mucous membranes are moist.     Pharynx: Oropharynx is clear.  Cardiovascular:     Rate and Rhythm: Normal rate.  Pulmonary:     Effort: Pulmonary effort is normal.  Skin:    General: Skin is warm and dry.  Neurological:     Mental Status: She is alert and oriented to person, place, and time.     (all labs ordered are listed, but only abnormal results are displayed) Labs Reviewed - No data to display  EKG: None  Radiology: No results found.     Medications Ordered in the ED - No data to display  Medical Decision Making Patient is a 34 year old female with a history of depression, IV drug abuse, and homelessness who presents to the ED via EMS for cold exposure.  Patient has no complaints other than feeling cold and EMS stated they could not find a cold shelter.  On exam patient is alert and in no acute distress.  Physical exam as noted above.  Vital signs stable including normal temperature.  EKG shows sinus rhythm.  No concerns for emergent workup as patient has no complaints other than sleeping outside last evening.  With reassuring vital signs, she is stable for discharge.  Return precautions provided for worsening symptoms.      Final diagnoses:  Cold exposure, initial encounter    ED Discharge Orders     None          Neysa Thersia RAMAN, PA-C 01/28/24  1326    Elnor Savant A, DO 02/02/24 1711  "

## 2024-01-29 MED ORDER — POLYETHYLENE GLYCOL 3350 17 G PO PACK: 17.0000 g | PACK | Freq: Every day | ORAL | 1 refills | Status: AC

## 2024-01-29 NOTE — Discharge Instructions (Signed)
 You were evaluated in the Emergency Department and after careful evaluation, we did not find any emergent condition requiring admission or further testing in the hospital.  Your exam/testing today is overall reassuring.  Symptoms may be due to a urinary tract infection.  Take the Keflex  antibiotic as directed.  Constipation may also be contributing to your discomfort.  Recommend use of the MiraLAX  daily.  Please return to the Emergency Department if you experience any worsening of your condition.   Thank you for allowing us  to be a part of your care.

## 2024-01-31 LAB — URINE CULTURE: Culture: >=100000 — AB

## 2024-02-01 ENCOUNTER — Telehealth (HOSPITAL_BASED_OUTPATIENT_CLINIC_OR_DEPARTMENT_OTHER): Payer: Self-pay | Admitting: *Deleted

## 2024-02-01 NOTE — Telephone Encounter (Signed)
 Post ED Visit - Positive Culture Follow-up  Culture report reviewed by antimicrobial stewardship pharmacist: [facility] []  Rankin Dee, Pharm.D. []  Venetia Gully, Pharm.D., BCPS AQ-ID []  Garrel Crews, Pharm.D., BCPS []  Almarie Lunger, [address].D., BCPS []  [place], [place].D., BCPS, AAHIVP []  Rosaline Bihari, Pharm.D., BCPS, AAHIVP []  Vernell Meier, PharmD, BCPS []  Latanya Hint, PharmD, BCPS []  Donald Medley, PharmD, BCPS []  Rocky Bold, PharmD []  Dorothyann Alert, PharmD, BCPS []  Morene Babe, PharmD  [facility] []  Rosaline Edison, PharmD []  Romona Bliss, PharmD []  Dolphus Roller, PharmD []  Veva Seip, Rph []  Vernell Daunt) Leonce, PharmD []  Eva Allis, PharmD []  Rosaline Millet, PharmD []  Iantha Batch, PharmD []  Arvin Gauss, PharmD []  Wanda Hasting, PharmD []  Ronal Rav, PharmD []  Rocky Slade, PharmD [x]  Almarie Lunger, PharmD   Positive urine culture Treated with Cephalexin , organism sensitive to the same and no further patient follow-up is required at this time.  Amber [name] 02/01/2024, 9:33 AM
